# Patient Record
Sex: Female | Born: 1935 | Race: Black or African American | Hispanic: No | Smoking: Former smoker
Health system: Southern US, Community
[De-identification: ages and names within clinical notes are randomized; demographics above are authoritative.]

## PROBLEM LIST (undated history)

## (undated) DIAGNOSIS — I471 Supraventricular tachycardia, unspecified: Secondary | ICD-10-CM

## (undated) DIAGNOSIS — M8580 Other specified disorders of bone density and structure, unspecified site: Secondary | ICD-10-CM

## (undated) DIAGNOSIS — R55 Syncope and collapse: Secondary | ICD-10-CM

## (undated) DIAGNOSIS — F419 Anxiety disorder, unspecified: Secondary | ICD-10-CM

## (undated) DIAGNOSIS — Z8719 Personal history of other diseases of the digestive system: Secondary | ICD-10-CM

## (undated) DIAGNOSIS — R51 Headache: Secondary | ICD-10-CM

## (undated) DIAGNOSIS — Z853 Personal history of malignant neoplasm of breast: Secondary | ICD-10-CM

## (undated) DIAGNOSIS — I739 Peripheral vascular disease, unspecified: Secondary | ICD-10-CM

## (undated) DIAGNOSIS — E119 Type 2 diabetes mellitus without complications: Secondary | ICD-10-CM

## (undated) DIAGNOSIS — R410 Disorientation, unspecified: Secondary | ICD-10-CM

## (undated) DIAGNOSIS — K922 Gastrointestinal hemorrhage, unspecified: Secondary | ICD-10-CM

## (undated) DIAGNOSIS — D490 Neoplasm of unspecified behavior of digestive system: Secondary | ICD-10-CM

## (undated) DIAGNOSIS — I2510 Atherosclerotic heart disease of native coronary artery without angina pectoris: Secondary | ICD-10-CM

## (undated) DIAGNOSIS — K769 Liver disease, unspecified: Secondary | ICD-10-CM

## (undated) DIAGNOSIS — L89610 Pressure ulcer of right heel, unstageable: Secondary | ICD-10-CM

## (undated) DIAGNOSIS — S72001A Fracture of unspecified part of neck of right femur, initial encounter for closed fracture: Secondary | ICD-10-CM

## (undated) DIAGNOSIS — I5031 Acute diastolic (congestive) heart failure: Secondary | ICD-10-CM

## (undated) DIAGNOSIS — F329 Major depressive disorder, single episode, unspecified: Secondary | ICD-10-CM

## (undated) DIAGNOSIS — I779 Disorder of arteries and arterioles, unspecified: Secondary | ICD-10-CM

## (undated) DIAGNOSIS — I10 Essential (primary) hypertension: Secondary | ICD-10-CM

## (undated) DIAGNOSIS — E785 Hyperlipidemia, unspecified: Secondary | ICD-10-CM

## (undated) DIAGNOSIS — J189 Pneumonia, unspecified organism: Secondary | ICD-10-CM

## (undated) DIAGNOSIS — G8929 Other chronic pain: Secondary | ICD-10-CM

## (undated) DIAGNOSIS — F32A Depression, unspecified: Secondary | ICD-10-CM

## (undated) DIAGNOSIS — I951 Orthostatic hypotension: Principal | ICD-10-CM

## (undated) DIAGNOSIS — C801 Malignant (primary) neoplasm, unspecified: Secondary | ICD-10-CM

## (undated) HISTORY — DX: Other specified disorders of bone density and structure, unspecified site: M85.80

## (undated) HISTORY — PX: ABDOMINAL HYSTERECTOMY: SHX81

## (undated) HISTORY — PX: YAG LASER APPLICATION: SHX6189

## (undated) HISTORY — PX: APPENDECTOMY: SHX54

## (undated) HISTORY — PX: CATARACT EXTRACTION: SUR2

## (undated) HISTORY — DX: Neoplasm of unspecified behavior of digestive system: D49.0

## (undated) HISTORY — DX: Major depressive disorder, single episode, unspecified: F32.9

## (undated) HISTORY — PX: HEMORRHOID SURGERY: SHX153

## (undated) HISTORY — DX: Hyperlipidemia, unspecified: E78.5

## (undated) HISTORY — DX: Gastrointestinal hemorrhage, unspecified: K92.2

## (undated) HISTORY — DX: Depression, unspecified: F32.A

---

## 1977-11-16 HISTORY — PX: VESICOVAGINAL FISTULA CLOSURE W/ TAH: SUR271

## 1992-11-16 HISTORY — PX: MASTECTOMY: SHX3

## 2001-05-26 ENCOUNTER — Other Ambulatory Visit: Admission: RE | Admit: 2001-05-26 | Discharge: 2001-05-26 | Payer: Self-pay | Admitting: Family Medicine

## 2001-11-02 ENCOUNTER — Encounter: Payer: Self-pay | Admitting: Family Medicine

## 2001-11-02 ENCOUNTER — Ambulatory Visit (HOSPITAL_COMMUNITY): Admission: RE | Admit: 2001-11-02 | Discharge: 2001-11-02 | Payer: Self-pay | Admitting: Family Medicine

## 2002-10-06 ENCOUNTER — Ambulatory Visit (HOSPITAL_COMMUNITY): Admission: RE | Admit: 2002-10-06 | Discharge: 2002-10-06 | Payer: Self-pay | Admitting: Family Medicine

## 2002-10-06 ENCOUNTER — Encounter: Payer: Self-pay | Admitting: Family Medicine

## 2002-11-20 ENCOUNTER — Encounter: Payer: Self-pay | Admitting: Family Medicine

## 2002-11-20 ENCOUNTER — Ambulatory Visit (HOSPITAL_COMMUNITY): Admission: RE | Admit: 2002-11-20 | Discharge: 2002-11-20 | Payer: Self-pay | Admitting: Family Medicine

## 2003-11-22 ENCOUNTER — Ambulatory Visit (HOSPITAL_COMMUNITY): Admission: RE | Admit: 2003-11-22 | Discharge: 2003-11-22 | Payer: Self-pay | Admitting: Family Medicine

## 2004-04-17 ENCOUNTER — Ambulatory Visit (HOSPITAL_COMMUNITY): Admission: RE | Admit: 2004-04-17 | Discharge: 2004-04-17 | Payer: Self-pay | Admitting: General Surgery

## 2004-11-24 ENCOUNTER — Ambulatory Visit (HOSPITAL_COMMUNITY): Admission: RE | Admit: 2004-11-24 | Discharge: 2004-11-24 | Payer: Self-pay | Admitting: Family Medicine

## 2005-01-22 ENCOUNTER — Emergency Department (HOSPITAL_COMMUNITY): Admission: EM | Admit: 2005-01-22 | Discharge: 2005-01-23 | Payer: Self-pay | Admitting: *Deleted

## 2005-08-11 ENCOUNTER — Ambulatory Visit (HOSPITAL_COMMUNITY): Admission: RE | Admit: 2005-08-11 | Discharge: 2005-08-11 | Payer: Self-pay | Admitting: Ophthalmology

## 2005-11-26 ENCOUNTER — Encounter (INDEPENDENT_AMBULATORY_CARE_PROVIDER_SITE_OTHER): Payer: Self-pay | Admitting: Family Medicine

## 2005-11-26 LAB — CONVERTED CEMR LAB: Hgb A1c MFr Bld: 9.1 %

## 2005-11-30 ENCOUNTER — Ambulatory Visit (HOSPITAL_COMMUNITY): Admission: RE | Admit: 2005-11-30 | Discharge: 2005-11-30 | Payer: Self-pay | Admitting: Family Medicine

## 2006-05-30 ENCOUNTER — Emergency Department (HOSPITAL_COMMUNITY): Admission: EM | Admit: 2006-05-30 | Discharge: 2006-05-30 | Payer: Self-pay | Admitting: Emergency Medicine

## 2006-06-01 ENCOUNTER — Ambulatory Visit (HOSPITAL_COMMUNITY): Admission: RE | Admit: 2006-06-01 | Discharge: 2006-06-01 | Payer: Self-pay | Admitting: Family Medicine

## 2006-06-01 ENCOUNTER — Ambulatory Visit: Payer: Self-pay | Admitting: Family Medicine

## 2006-06-01 LAB — CONVERTED CEMR LAB

## 2006-06-08 ENCOUNTER — Other Ambulatory Visit: Admission: RE | Admit: 2006-06-08 | Discharge: 2006-06-08 | Payer: Self-pay | Admitting: Otolaryngology

## 2006-06-15 ENCOUNTER — Ambulatory Visit: Payer: Self-pay | Admitting: Family Medicine

## 2006-06-15 LAB — CONVERTED CEMR LAB: Hgb A1c MFr Bld: 7.4 %

## 2006-07-13 ENCOUNTER — Ambulatory Visit: Payer: Self-pay | Admitting: Family Medicine

## 2006-07-15 ENCOUNTER — Ambulatory Visit (HOSPITAL_COMMUNITY): Admission: RE | Admit: 2006-07-15 | Discharge: 2006-07-15 | Payer: Self-pay | Admitting: Family Medicine

## 2006-07-20 ENCOUNTER — Ambulatory Visit: Payer: Self-pay | Admitting: Family Medicine

## 2006-08-24 ENCOUNTER — Ambulatory Visit: Payer: Self-pay | Admitting: Family Medicine

## 2006-09-21 ENCOUNTER — Ambulatory Visit: Payer: Self-pay | Admitting: Family Medicine

## 2006-09-21 LAB — CONVERTED CEMR LAB: Hgb A1c MFr Bld: 6.8 %

## 2006-10-04 ENCOUNTER — Ambulatory Visit (HOSPITAL_COMMUNITY): Admission: RE | Admit: 2006-10-04 | Discharge: 2006-10-04 | Payer: Self-pay | Admitting: Family Medicine

## 2006-10-11 ENCOUNTER — Encounter: Payer: Self-pay | Admitting: Family Medicine

## 2006-10-11 DIAGNOSIS — M949 Disorder of cartilage, unspecified: Secondary | ICD-10-CM

## 2006-10-11 DIAGNOSIS — F411 Generalized anxiety disorder: Secondary | ICD-10-CM | POA: Insufficient documentation

## 2006-10-11 DIAGNOSIS — F32A Depression, unspecified: Secondary | ICD-10-CM | POA: Insufficient documentation

## 2006-10-11 DIAGNOSIS — Z853 Personal history of malignant neoplasm of breast: Secondary | ICD-10-CM | POA: Insufficient documentation

## 2006-10-11 DIAGNOSIS — M899 Disorder of bone, unspecified: Secondary | ICD-10-CM | POA: Insufficient documentation

## 2006-10-11 DIAGNOSIS — E785 Hyperlipidemia, unspecified: Secondary | ICD-10-CM | POA: Insufficient documentation

## 2006-10-11 DIAGNOSIS — I10 Essential (primary) hypertension: Secondary | ICD-10-CM | POA: Insufficient documentation

## 2006-10-11 DIAGNOSIS — F329 Major depressive disorder, single episode, unspecified: Secondary | ICD-10-CM

## 2006-10-19 ENCOUNTER — Ambulatory Visit: Payer: Self-pay | Admitting: Family Medicine

## 2006-10-26 ENCOUNTER — Ambulatory Visit (HOSPITAL_COMMUNITY): Admission: RE | Admit: 2006-10-26 | Discharge: 2006-10-26 | Payer: Self-pay | Admitting: Ophthalmology

## 2006-11-03 ENCOUNTER — Ambulatory Visit: Payer: Self-pay | Admitting: Family Medicine

## 2006-11-16 HISTORY — PX: CHOLECYSTECTOMY: SHX55

## 2006-12-01 ENCOUNTER — Ambulatory Visit: Payer: Self-pay | Admitting: Family Medicine

## 2006-12-01 LAB — CONVERTED CEMR LAB
ALT: 37 units/L — ABNORMAL HIGH (ref 0–35)
AST: 32 units/L (ref 0–37)
Albumin: 4.3 g/dL (ref 3.5–5.2)
Alkaline Phosphatase: 148 units/L — ABNORMAL HIGH (ref 39–117)
Bilirubin, Direct: 0.1 mg/dL (ref 0.0–0.3)
Indirect Bilirubin: 0.5 mg/dL (ref 0.0–0.9)
Total Bilirubin: 0.6 mg/dL (ref 0.3–1.2)
Total Protein: 7.9 g/dL (ref 6.0–8.3)

## 2007-01-11 ENCOUNTER — Ambulatory Visit: Payer: Self-pay | Admitting: Family Medicine

## 2007-01-11 LAB — CONVERTED CEMR LAB
Cholesterol, target level: 200 mg/dL
Glucose, Bld: 218 mg/dL
HDL goal, serum: 40 mg/dL
Hgb A1c MFr Bld: 6.8 %
LDL Goal: 100 mg/dL

## 2007-01-13 ENCOUNTER — Encounter (INDEPENDENT_AMBULATORY_CARE_PROVIDER_SITE_OTHER): Payer: Self-pay | Admitting: Family Medicine

## 2007-01-13 ENCOUNTER — Ambulatory Visit (HOSPITAL_COMMUNITY): Admission: RE | Admit: 2007-01-13 | Discharge: 2007-01-13 | Payer: Self-pay | Admitting: Family Medicine

## 2007-01-18 ENCOUNTER — Encounter (INDEPENDENT_AMBULATORY_CARE_PROVIDER_SITE_OTHER): Payer: Self-pay | Admitting: Family Medicine

## 2007-01-19 LAB — CONVERTED CEMR LAB
ALT: 27 units/L (ref 0–35)
AST: 24 units/L (ref 0–37)
Albumin: 4.1 g/dL (ref 3.5–5.2)
Alkaline Phosphatase: 120 units/L — ABNORMAL HIGH (ref 39–117)
BUN: 14 mg/dL (ref 6–23)
CO2: 26 meq/L (ref 19–32)
Calcium: 9.6 mg/dL (ref 8.4–10.5)
Chloride: 99 meq/L (ref 96–112)
Cholesterol: 209 mg/dL — ABNORMAL HIGH (ref 0–200)
Creatinine, Ser: 0.61 mg/dL (ref 0.40–1.20)
Glucose, Bld: 174 mg/dL — ABNORMAL HIGH (ref 70–99)
HDL: 50 mg/dL (ref >39)
LDL Cholesterol: 131 mg/dL — ABNORMAL HIGH (ref 0–99)
Potassium: 4 meq/L (ref 3.5–5.3)
Sodium: 140 meq/L (ref 135–145)
Total Bilirubin: 0.7 mg/dL (ref 0.3–1.2)
Total CHOL/HDL Ratio: 4.2
Total Protein: 8 g/dL (ref 6.0–8.3)
Triglycerides: 138 mg/dL (ref <150)
VLDL: 28 mg/dL (ref 0–40)

## 2007-02-07 ENCOUNTER — Encounter (INDEPENDENT_AMBULATORY_CARE_PROVIDER_SITE_OTHER): Payer: Self-pay | Admitting: Family Medicine

## 2007-03-21 ENCOUNTER — Ambulatory Visit: Payer: Self-pay | Admitting: Family Medicine

## 2007-03-21 DIAGNOSIS — K922 Gastrointestinal hemorrhage, unspecified: Secondary | ICD-10-CM

## 2007-03-21 HISTORY — DX: Gastrointestinal hemorrhage, unspecified: K92.2

## 2007-03-21 LAB — CONVERTED CEMR LAB: Hemoglobin: 12 g/dL

## 2007-03-22 ENCOUNTER — Encounter (INDEPENDENT_AMBULATORY_CARE_PROVIDER_SITE_OTHER): Payer: Self-pay | Admitting: Family Medicine

## 2007-03-23 ENCOUNTER — Encounter (INDEPENDENT_AMBULATORY_CARE_PROVIDER_SITE_OTHER): Payer: Self-pay | Admitting: Family Medicine

## 2007-03-23 LAB — CONVERTED CEMR LAB: RBC / HPF: NONE SEEN (ref <3)

## 2007-04-04 ENCOUNTER — Ambulatory Visit: Payer: Self-pay | Admitting: Family Medicine

## 2007-04-04 LAB — CONVERTED CEMR LAB
Glucose, Bld: 205 mg/dL
Hgb A1c MFr Bld: 6.4 %

## 2007-04-07 ENCOUNTER — Encounter (INDEPENDENT_AMBULATORY_CARE_PROVIDER_SITE_OTHER): Payer: Self-pay | Admitting: Family Medicine

## 2007-04-08 ENCOUNTER — Encounter (INDEPENDENT_AMBULATORY_CARE_PROVIDER_SITE_OTHER): Payer: Self-pay | Admitting: Family Medicine

## 2007-04-08 LAB — CONVERTED CEMR LAB
Basophils Absolute: 0.1 10*3/uL (ref 0.0–0.1)
Basophils Relative: 1 % (ref 0–1)
Eosinophils Absolute: 0.2 10*3/uL (ref 0.0–0.7)
Eosinophils Relative: 3 % (ref 0–5)
HCT: 39.2 % (ref 36.0–46.0)
Hemoglobin: 13.1 g/dL (ref 12.0–15.0)
Lymphocytes Relative: 37 % (ref 12–46)
Lymphs Abs: 2.9 10*3/uL (ref 0.7–3.3)
MCHC: 33.4 g/dL (ref 30.0–36.0)
MCV: 99.5 fL (ref 78.0–100.0)
Monocytes Absolute: 0.4 10*3/uL (ref 0.2–0.7)
Monocytes Relative: 5 % (ref 3–11)
Neutro Abs: 4.2 10*3/uL (ref 1.7–7.7)
Neutrophils Relative %: 54 % (ref 43–77)
Platelets: 379 10*3/uL (ref 150–400)
RBC: 3.94 M/uL (ref 3.87–5.11)
RDW: 14.1 % — ABNORMAL HIGH (ref 11.5–14.0)
WBC: 7.7 10*3/uL (ref 4.0–10.5)

## 2007-04-12 ENCOUNTER — Encounter (INDEPENDENT_AMBULATORY_CARE_PROVIDER_SITE_OTHER): Payer: Self-pay | Admitting: Family Medicine

## 2007-04-21 ENCOUNTER — Encounter (INDEPENDENT_AMBULATORY_CARE_PROVIDER_SITE_OTHER): Payer: Self-pay | Admitting: Family Medicine

## 2007-05-09 ENCOUNTER — Ambulatory Visit: Payer: Self-pay | Admitting: Family Medicine

## 2007-05-10 ENCOUNTER — Encounter (INDEPENDENT_AMBULATORY_CARE_PROVIDER_SITE_OTHER): Payer: Self-pay | Admitting: Family Medicine

## 2007-05-10 LAB — CONVERTED CEMR LAB
ALT: 20 U/L
AST: 22 U/L
Albumin: 4.2 g/dL
Alkaline Phosphatase: 109 U/L
Bilirubin, Direct: 0.2 mg/dL
Indirect Bilirubin: 0.5 mg/dL
Total Bilirubin: 0.7 mg/dL
Total Protein: 7.8 g/dL

## 2007-05-16 ENCOUNTER — Encounter (HOSPITAL_COMMUNITY): Admission: RE | Admit: 2007-05-16 | Discharge: 2007-06-15 | Payer: Self-pay | Admitting: Family Medicine

## 2007-06-20 ENCOUNTER — Ambulatory Visit: Payer: Self-pay | Admitting: Family Medicine

## 2007-06-20 ENCOUNTER — Telehealth (INDEPENDENT_AMBULATORY_CARE_PROVIDER_SITE_OTHER): Payer: Self-pay | Admitting: *Deleted

## 2007-06-21 ENCOUNTER — Encounter (INDEPENDENT_AMBULATORY_CARE_PROVIDER_SITE_OTHER): Payer: Self-pay | Admitting: Family Medicine

## 2007-06-21 ENCOUNTER — Telehealth (INDEPENDENT_AMBULATORY_CARE_PROVIDER_SITE_OTHER): Payer: Self-pay | Admitting: *Deleted

## 2007-06-21 LAB — CONVERTED CEMR LAB
ALT: 21 units/L (ref 0–35)
AST: 24 units/L (ref 0–37)
Albumin: 4.3 g/dL (ref 3.5–5.2)
Alkaline Phosphatase: 113 units/L (ref 39–117)
BUN: 11 mg/dL (ref 6–23)
Basophils Absolute: 0.1 10*3/uL (ref 0.0–0.1)
Basophils Relative: 2 % — ABNORMAL HIGH (ref 0–1)
CO2: 24 meq/L (ref 19–32)
Calcium: 9.5 mg/dL (ref 8.4–10.5)
Chloride: 99 meq/L (ref 96–112)
Creatinine, Ser: 0.56 mg/dL (ref 0.40–1.20)
Eosinophils Absolute: 0.1 10*3/uL (ref 0.0–0.7)
Eosinophils Relative: 2 % (ref 0–5)
Glucose, Bld: 157 mg/dL — ABNORMAL HIGH (ref 70–99)
HCT: 40.5 % (ref 36.0–46.0)
Hemoglobin: 13.4 g/dL (ref 12.0–15.0)
Lymphocytes Relative: 46 % (ref 12–46)
Lymphs Abs: 2.5 10*3/uL (ref 0.7–3.3)
MCHC: 33.1 g/dL (ref 30.0–36.0)
MCV: 98.3 fL (ref 78.0–100.0)
Monocytes Absolute: 0.2 10*3/uL (ref 0.2–0.7)
Monocytes Relative: 4 % (ref 3–11)
Neutro Abs: 2.5 10*3/uL (ref 1.7–7.7)
Neutrophils Relative %: 47 % (ref 43–77)
Platelets: 331 10*3/uL (ref 150–400)
Potassium: 3.9 meq/L (ref 3.5–5.3)
RBC: 4.12 M/uL (ref 3.87–5.11)
RDW: 13.7 % (ref 11.5–14.0)
Sodium: 138 meq/L (ref 135–145)
Total Bilirubin: 0.8 mg/dL (ref 0.3–1.2)
Total Protein: 7.8 g/dL (ref 6.0–8.3)
WBC: 5.4 10*3/uL (ref 4.0–10.5)

## 2007-07-06 ENCOUNTER — Encounter (INDEPENDENT_AMBULATORY_CARE_PROVIDER_SITE_OTHER): Payer: Self-pay | Admitting: Family Medicine

## 2007-07-15 ENCOUNTER — Encounter (INDEPENDENT_AMBULATORY_CARE_PROVIDER_SITE_OTHER): Payer: Self-pay | Admitting: General Surgery

## 2007-07-15 ENCOUNTER — Ambulatory Visit (HOSPITAL_COMMUNITY): Admission: RE | Admit: 2007-07-15 | Discharge: 2007-07-16 | Payer: Self-pay | Admitting: General Surgery

## 2007-07-20 ENCOUNTER — Ambulatory Visit: Payer: Self-pay | Admitting: Family Medicine

## 2007-07-20 LAB — CONVERTED CEMR LAB
Glucose, Bld: 226 mg/dL
Hgb A1c MFr Bld: 7.1 %

## 2007-08-03 ENCOUNTER — Encounter (INDEPENDENT_AMBULATORY_CARE_PROVIDER_SITE_OTHER): Payer: Self-pay | Admitting: Family Medicine

## 2007-08-11 ENCOUNTER — Encounter (INDEPENDENT_AMBULATORY_CARE_PROVIDER_SITE_OTHER): Payer: Self-pay | Admitting: Family Medicine

## 2007-08-12 LAB — CONVERTED CEMR LAB
ALT: 17 units/L (ref 0–35)
AST: 19 units/L (ref 0–37)
Albumin: 4.3 g/dL (ref 3.5–5.2)
Alkaline Phosphatase: 118 units/L — ABNORMAL HIGH (ref 39–117)
BUN: 9 mg/dL (ref 6–23)
CO2: 28 meq/L (ref 19–32)
Calcium: 9.4 mg/dL (ref 8.4–10.5)
Chloride: 99 meq/L (ref 96–112)
Cholesterol: 131 mg/dL (ref 0–200)
Creatinine, Ser: 0.59 mg/dL (ref 0.40–1.20)
Glucose, Bld: 129 mg/dL — ABNORMAL HIGH (ref 70–99)
HDL: 50 mg/dL (ref >39)
LDL Cholesterol: 64 mg/dL (ref 0–99)
Potassium: 3.8 meq/L (ref 3.5–5.3)
Sodium: 141 meq/L (ref 135–145)
Total Bilirubin: 0.7 mg/dL (ref 0.3–1.2)
Total CHOL/HDL Ratio: 2.6
Total Protein: 7.9 g/dL (ref 6.0–8.3)
Triglycerides: 86 mg/dL (ref <150)
VLDL: 17 mg/dL (ref 0–40)

## 2007-08-16 ENCOUNTER — Encounter (INDEPENDENT_AMBULATORY_CARE_PROVIDER_SITE_OTHER): Payer: Self-pay | Admitting: Family Medicine

## 2007-09-15 ENCOUNTER — Encounter (INDEPENDENT_AMBULATORY_CARE_PROVIDER_SITE_OTHER): Payer: Self-pay | Admitting: Family Medicine

## 2007-10-18 ENCOUNTER — Encounter (INDEPENDENT_AMBULATORY_CARE_PROVIDER_SITE_OTHER): Payer: Self-pay | Admitting: Family Medicine

## 2007-10-19 ENCOUNTER — Ambulatory Visit: Payer: Self-pay | Admitting: Family Medicine

## 2007-10-19 LAB — CONVERTED CEMR LAB
Glucose, Bld: 166 mg/dL
Hgb A1c MFr Bld: 7 %

## 2007-10-21 ENCOUNTER — Encounter (INDEPENDENT_AMBULATORY_CARE_PROVIDER_SITE_OTHER): Payer: Self-pay | Admitting: Family Medicine

## 2007-10-21 LAB — CONVERTED CEMR LAB
Bilirubin Urine: NEGATIVE
Glucose, Urine, Semiquant: NEGATIVE
Ketones, urine, test strip: NEGATIVE
Nitrite: NEGATIVE
Protein, U semiquant: NEGATIVE
Specific Gravity, Urine: 1.015
Urobilinogen, UA: 0.2
pH: 7

## 2007-10-22 ENCOUNTER — Encounter (INDEPENDENT_AMBULATORY_CARE_PROVIDER_SITE_OTHER): Payer: Self-pay | Admitting: Family Medicine

## 2007-10-22 LAB — CONVERTED CEMR LAB
Creatinine, Urine: 65.8 mg/dL
Microalb Creat Ratio: 8.8 mg/g (ref 0.0–30.0)
Microalb, Ur: 0.58 mg/dL (ref 0.00–1.89)

## 2007-10-24 ENCOUNTER — Telehealth (INDEPENDENT_AMBULATORY_CARE_PROVIDER_SITE_OTHER): Payer: Self-pay | Admitting: *Deleted

## 2008-01-17 ENCOUNTER — Ambulatory Visit (HOSPITAL_COMMUNITY): Admission: RE | Admit: 2008-01-17 | Discharge: 2008-01-17 | Payer: Self-pay | Admitting: Family Medicine

## 2008-01-17 ENCOUNTER — Encounter (INDEPENDENT_AMBULATORY_CARE_PROVIDER_SITE_OTHER): Payer: Self-pay | Admitting: Family Medicine

## 2008-01-17 LAB — CONVERTED CEMR LAB: Pap Smear: NORMAL

## 2008-01-18 ENCOUNTER — Ambulatory Visit: Payer: Self-pay | Admitting: Family Medicine

## 2008-01-18 LAB — CONVERTED CEMR LAB
Glucose, Bld: 188 mg/dL
Hgb A1c MFr Bld: 7.6 %

## 2008-01-19 ENCOUNTER — Encounter (INDEPENDENT_AMBULATORY_CARE_PROVIDER_SITE_OTHER): Payer: Self-pay | Admitting: Family Medicine

## 2008-01-19 ENCOUNTER — Telehealth (INDEPENDENT_AMBULATORY_CARE_PROVIDER_SITE_OTHER): Payer: Self-pay | Admitting: *Deleted

## 2008-01-19 LAB — CONVERTED CEMR LAB
BUN: 11 mg/dL (ref 6–23)
Basophils Absolute: 0.1 10*3/uL (ref 0.0–0.1)
Basophils Relative: 2 % — ABNORMAL HIGH (ref 0–1)
CO2: 25 meq/L (ref 19–32)
Calcium: 9.3 mg/dL (ref 8.4–10.5)
Chloride: 100 meq/L (ref 96–112)
Creatinine, Ser: 0.57 mg/dL (ref 0.40–1.20)
Eosinophils Absolute: 0.1 10*3/uL (ref 0.0–0.7)
Eosinophils Relative: 1 % (ref 0–5)
Glucose, Bld: 156 mg/dL — ABNORMAL HIGH (ref 70–99)
HCT: 40.6 % (ref 36.0–46.0)
Hemoglobin: 13.4 g/dL (ref 12.0–15.0)
Lymphocytes Relative: 42 % (ref 12–46)
Lymphs Abs: 2.3 10*3/uL (ref 0.7–4.0)
MCHC: 33 g/dL (ref 30.0–36.0)
MCV: 99.5 fL (ref 78.0–100.0)
Monocytes Absolute: 0.3 10*3/uL (ref 0.1–1.0)
Monocytes Relative: 5 % (ref 3–12)
Neutro Abs: 2.8 10*3/uL (ref 1.7–7.7)
Neutrophils Relative %: 50 % (ref 43–77)
Platelets: 281 10*3/uL (ref 150–400)
Potassium: 3.8 meq/L (ref 3.5–5.3)
RBC: 4.08 M/uL (ref 3.87–5.11)
RDW: 13.9 % (ref 11.5–15.5)
Sodium: 138 meq/L (ref 135–145)
WBC: 5.5 10*3/uL (ref 4.0–10.5)

## 2008-01-24 ENCOUNTER — Other Ambulatory Visit: Admission: RE | Admit: 2008-01-24 | Discharge: 2008-01-24 | Payer: Self-pay | Admitting: Family Medicine

## 2008-01-24 ENCOUNTER — Ambulatory Visit: Payer: Self-pay | Admitting: Family Medicine

## 2008-01-24 ENCOUNTER — Encounter (INDEPENDENT_AMBULATORY_CARE_PROVIDER_SITE_OTHER): Payer: Self-pay | Admitting: Family Medicine

## 2008-01-25 ENCOUNTER — Encounter (INDEPENDENT_AMBULATORY_CARE_PROVIDER_SITE_OTHER): Payer: Self-pay | Admitting: Family Medicine

## 2008-01-27 ENCOUNTER — Telehealth (INDEPENDENT_AMBULATORY_CARE_PROVIDER_SITE_OTHER): Payer: Self-pay | Admitting: *Deleted

## 2008-01-27 LAB — CONVERTED CEMR LAB
Candida species: NEGATIVE
Gardnerella vaginalis: POSITIVE — AB
Trichomonal Vaginitis: NEGATIVE

## 2008-02-17 ENCOUNTER — Encounter (INDEPENDENT_AMBULATORY_CARE_PROVIDER_SITE_OTHER): Payer: Self-pay | Admitting: Family Medicine

## 2008-03-05 ENCOUNTER — Ambulatory Visit: Payer: Self-pay | Admitting: Family Medicine

## 2008-03-05 DIAGNOSIS — L905 Scar conditions and fibrosis of skin: Secondary | ICD-10-CM | POA: Insufficient documentation

## 2008-03-06 ENCOUNTER — Encounter (INDEPENDENT_AMBULATORY_CARE_PROVIDER_SITE_OTHER): Payer: Self-pay | Admitting: Family Medicine

## 2008-05-01 ENCOUNTER — Encounter (INDEPENDENT_AMBULATORY_CARE_PROVIDER_SITE_OTHER): Payer: Self-pay | Admitting: Family Medicine

## 2008-05-03 LAB — CONVERTED CEMR LAB
ALT: 16 units/L (ref 0–35)
AST: 18 units/L (ref 0–37)
Albumin: 4.3 g/dL (ref 3.5–5.2)
Alkaline Phosphatase: 93 units/L (ref 39–117)
BUN: 13 mg/dL (ref 6–23)
CO2: 27 meq/L (ref 19–32)
Calcium: 9.5 mg/dL (ref 8.4–10.5)
Chloride: 99 meq/L (ref 96–112)
Cholesterol: 140 mg/dL (ref 0–200)
Creatinine, Ser: 0.5 mg/dL (ref 0.40–1.20)
Glucose, Bld: 140 mg/dL — ABNORMAL HIGH (ref 70–99)
HDL: 46 mg/dL (ref >39)
LDL Cholesterol: 78 mg/dL (ref 0–99)
Potassium: 3.9 meq/L (ref 3.5–5.3)
Sodium: 140 meq/L (ref 135–145)
Total Bilirubin: 0.7 mg/dL (ref 0.3–1.2)
Total CHOL/HDL Ratio: 3
Total Protein: 7.6 g/dL (ref 6.0–8.3)
Triglycerides: 78 mg/dL (ref <150)
VLDL: 16 mg/dL (ref 0–40)

## 2008-05-07 ENCOUNTER — Ambulatory Visit: Payer: Self-pay | Admitting: Family Medicine

## 2008-05-07 DIAGNOSIS — R259 Unspecified abnormal involuntary movements: Secondary | ICD-10-CM | POA: Insufficient documentation

## 2008-05-07 LAB — CONVERTED CEMR LAB
Glucose, Bld: 169 mg/dL
Hgb A1c MFr Bld: 7.1 %

## 2008-05-09 LAB — CONVERTED CEMR LAB
Basophils Absolute: 0.1 10*3/uL (ref 0.0–0.1)
Basophils Relative: 3 % — ABNORMAL HIGH (ref 0–1)
Eosinophils Absolute: 0.1 10*3/uL (ref 0.0–0.7)
Eosinophils Relative: 2 % (ref 0–5)
HCT: 39.2 % (ref 36.0–46.0)
Hemoglobin: 13.3 g/dL (ref 12.0–15.0)
Lymphocytes Relative: 42 % (ref 12–46)
Lymphs Abs: 2.1 10*3/uL (ref 0.7–4.0)
MCHC: 33.9 g/dL (ref 30.0–36.0)
MCV: 97.5 fL (ref 78.0–100.0)
Monocytes Absolute: 0.3 10*3/uL (ref 0.1–1.0)
Monocytes Relative: 6 % (ref 3–12)
Neutro Abs: 2.4 10*3/uL (ref 1.7–7.7)
Neutrophils Relative %: 48 % (ref 43–77)
Platelets: 300 10*3/uL (ref 150–400)
RBC: 4.02 M/uL (ref 3.87–5.11)
RDW: 14.2 % (ref 11.5–15.5)
TSH: 1.335 microintl units/mL (ref 0.350–5.50)
WBC: 5.1 10*3/uL (ref 4.0–10.5)

## 2008-06-18 ENCOUNTER — Ambulatory Visit: Payer: Self-pay | Admitting: Family Medicine

## 2008-06-18 ENCOUNTER — Telehealth (INDEPENDENT_AMBULATORY_CARE_PROVIDER_SITE_OTHER): Payer: Self-pay | Admitting: *Deleted

## 2008-07-02 ENCOUNTER — Encounter (INDEPENDENT_AMBULATORY_CARE_PROVIDER_SITE_OTHER): Payer: Self-pay | Admitting: Family Medicine

## 2008-07-30 ENCOUNTER — Ambulatory Visit: Payer: Self-pay | Admitting: Family Medicine

## 2008-07-30 DIAGNOSIS — E119 Type 2 diabetes mellitus without complications: Secondary | ICD-10-CM | POA: Insufficient documentation

## 2008-07-30 LAB — CONVERTED CEMR LAB
Glucose, Bld: 249 mg/dL
Hgb A1c MFr Bld: 6.9 %

## 2008-08-02 ENCOUNTER — Ambulatory Visit (HOSPITAL_COMMUNITY): Admission: RE | Admit: 2008-08-02 | Discharge: 2008-08-02 | Payer: Self-pay | Admitting: Family Medicine

## 2008-08-08 ENCOUNTER — Encounter (INDEPENDENT_AMBULATORY_CARE_PROVIDER_SITE_OTHER): Payer: Self-pay | Admitting: Family Medicine

## 2008-08-13 ENCOUNTER — Ambulatory Visit: Payer: Self-pay | Admitting: Family Medicine

## 2008-08-13 LAB — CONVERTED CEMR LAB
Bilirubin Urine: NEGATIVE
Glucose, Urine, Semiquant: NEGATIVE
Ketones, urine, test strip: NEGATIVE
Nitrite: NEGATIVE
Specific Gravity, Urine: 1.015
Urobilinogen, UA: 0.2
pH: 7

## 2008-09-10 ENCOUNTER — Ambulatory Visit: Payer: Self-pay | Admitting: Family Medicine

## 2008-09-15 ENCOUNTER — Emergency Department (HOSPITAL_COMMUNITY): Admission: EM | Admit: 2008-09-15 | Discharge: 2008-09-15 | Payer: Self-pay | Admitting: Emergency Medicine

## 2008-10-22 ENCOUNTER — Ambulatory Visit: Payer: Self-pay | Admitting: Family Medicine

## 2008-10-22 LAB — CONVERTED CEMR LAB
Glucose, Bld: 208 mg/dL
Hgb A1c MFr Bld: 7.1 %

## 2008-10-23 ENCOUNTER — Encounter (INDEPENDENT_AMBULATORY_CARE_PROVIDER_SITE_OTHER): Payer: Self-pay | Admitting: Family Medicine

## 2008-10-23 LAB — CONVERTED CEMR LAB
ALT: 25 units/L (ref 0–35)
AST: 25 units/L (ref 0–37)
Albumin: 4.4 g/dL (ref 3.5–5.2)
Alkaline Phosphatase: 152 units/L — ABNORMAL HIGH (ref 39–117)
BUN: 12 mg/dL (ref 6–23)
CO2: 24 meq/L (ref 19–32)
Calcium: 9.7 mg/dL (ref 8.4–10.5)
Chloride: 99 meq/L (ref 96–112)
Creatinine, Ser: 0.53 mg/dL (ref 0.40–1.20)
Glucose, Bld: 189 mg/dL — ABNORMAL HIGH (ref 70–99)
Potassium: 3.7 meq/L (ref 3.5–5.3)
Sodium: 137 meq/L (ref 135–145)
Total Bilirubin: 0.8 mg/dL (ref 0.3–1.2)
Total Protein: 7.9 g/dL (ref 6.0–8.3)

## 2008-10-29 ENCOUNTER — Ambulatory Visit (HOSPITAL_COMMUNITY): Admission: RE | Admit: 2008-10-29 | Discharge: 2008-10-29 | Payer: Self-pay | Admitting: Family Medicine

## 2008-10-29 ENCOUNTER — Encounter (INDEPENDENT_AMBULATORY_CARE_PROVIDER_SITE_OTHER): Payer: Self-pay | Admitting: Family Medicine

## 2008-11-06 ENCOUNTER — Ambulatory Visit: Payer: Self-pay | Admitting: Family Medicine

## 2008-11-16 HISTORY — PX: COLONOSCOPY: SHX174

## 2009-01-16 ENCOUNTER — Ambulatory Visit (HOSPITAL_COMMUNITY): Admission: RE | Admit: 2009-01-16 | Discharge: 2009-01-16 | Payer: Self-pay | Admitting: Family Medicine

## 2009-02-05 ENCOUNTER — Ambulatory Visit: Payer: Self-pay | Admitting: Family Medicine

## 2009-02-14 ENCOUNTER — Encounter (INDEPENDENT_AMBULATORY_CARE_PROVIDER_SITE_OTHER): Payer: Self-pay | Admitting: Family Medicine

## 2009-02-15 ENCOUNTER — Encounter (INDEPENDENT_AMBULATORY_CARE_PROVIDER_SITE_OTHER): Payer: Self-pay | Admitting: Family Medicine

## 2009-02-15 LAB — CONVERTED CEMR LAB
ALT: 19 units/L (ref 0–35)
AST: 23 units/L (ref 0–37)
Albumin: 4.1 g/dL (ref 3.5–5.2)
Alkaline Phosphatase: 121 units/L — ABNORMAL HIGH (ref 39–117)
BUN: 10 mg/dL (ref 6–23)
Basophils Absolute: 0.1 10*3/uL (ref 0.0–0.1)
Basophils Relative: 2 % — ABNORMAL HIGH (ref 0–1)
CO2: 27 meq/L (ref 19–32)
Calcium: 9.6 mg/dL (ref 8.4–10.5)
Chloride: 99 meq/L (ref 96–112)
Cholesterol: 151 mg/dL (ref 0–200)
Creatinine, Ser: 0.58 mg/dL (ref 0.40–1.20)
Eosinophils Absolute: 0.2 10*3/uL (ref 0.0–0.7)
Eosinophils Relative: 3 % (ref 0–5)
Glucose, Bld: 170 mg/dL — ABNORMAL HIGH (ref 70–99)
HCT: 38.4 % (ref 36.0–46.0)
HDL: 52 mg/dL (ref >39)
Hemoglobin: 13.6 g/dL (ref 12.0–15.0)
Hgb A1c MFr Bld: 7.6 % — ABNORMAL HIGH (ref 4.6–6.1)
LDL Cholesterol: 79 mg/dL (ref 0–99)
Lymphocytes Relative: 47 % — ABNORMAL HIGH (ref 12–46)
Lymphs Abs: 2.8 10*3/uL (ref 0.7–4.0)
MCHC: 35.4 g/dL (ref 30.0–36.0)
MCV: 95 fL (ref 78.0–100.0)
Microalb, Ur: 3.02 mg/dL — ABNORMAL HIGH (ref 0.00–1.89)
Monocytes Absolute: 0.3 10*3/uL (ref 0.1–1.0)
Monocytes Relative: 6 % (ref 3–12)
Neutro Abs: 2.6 10*3/uL (ref 1.7–7.7)
Neutrophils Relative %: 43 % (ref 43–77)
Platelets: 299 10*3/uL (ref 150–400)
Potassium: 4.3 meq/L (ref 3.5–5.3)
RBC: 4.04 M/uL (ref 3.87–5.11)
RDW: 14.4 % (ref 11.5–15.5)
Sodium: 138 meq/L (ref 135–145)
TSH: 1.945 microintl units/mL (ref 0.350–4.500)
Total Bilirubin: 0.8 mg/dL (ref 0.3–1.2)
Total CHOL/HDL Ratio: 2.9
Total Protein: 7.5 g/dL (ref 6.0–8.3)
Triglycerides: 98 mg/dL (ref <150)
VLDL: 20 mg/dL (ref 0–40)
WBC: 6 10*3/uL (ref 4.0–10.5)

## 2009-02-18 LAB — CONVERTED CEMR LAB
Creatinine, Urine: 89.7 mg/dL
Microalb Creat Ratio: 34 mg/g — ABNORMAL HIGH (ref 0.0–30.0)
Microalb, Ur: 3.05 mg/dL — ABNORMAL HIGH (ref 0.00–1.89)

## 2009-03-18 ENCOUNTER — Encounter (INDEPENDENT_AMBULATORY_CARE_PROVIDER_SITE_OTHER): Payer: Self-pay | Admitting: Family Medicine

## 2009-05-07 ENCOUNTER — Ambulatory Visit: Payer: Self-pay | Admitting: Family Medicine

## 2009-05-07 LAB — CONVERTED CEMR LAB
Glucose, Bld: 152 mg/dL
Hgb A1c MFr Bld: 7 %

## 2009-06-20 ENCOUNTER — Encounter (INDEPENDENT_AMBULATORY_CARE_PROVIDER_SITE_OTHER): Payer: Self-pay | Admitting: Family Medicine

## 2009-08-06 ENCOUNTER — Ambulatory Visit: Payer: Self-pay | Admitting: Family Medicine

## 2009-08-06 LAB — CONVERTED CEMR LAB
Glucose, Bld: 199 mg/dL
Hgb A1c MFr Bld: 7.3 %

## 2009-08-07 ENCOUNTER — Encounter (INDEPENDENT_AMBULATORY_CARE_PROVIDER_SITE_OTHER): Payer: Self-pay | Admitting: Family Medicine

## 2009-08-08 LAB — CONVERTED CEMR LAB
ALT: 21 units/L (ref 0–35)
AST: 23 units/L (ref 0–37)
Albumin: 4.1 g/dL (ref 3.5–5.2)
Alkaline Phosphatase: 112 units/L (ref 39–117)
BUN: 9 mg/dL (ref 6–23)
CO2: 27 meq/L (ref 19–32)
Calcium: 9.1 mg/dL (ref 8.4–10.5)
Chloride: 98 meq/L (ref 96–112)
Creatinine, Ser: 0.53 mg/dL (ref 0.40–1.20)
Glucose, Bld: 162 mg/dL — ABNORMAL HIGH (ref 70–99)
Potassium: 4 meq/L (ref 3.5–5.3)
Sodium: 136 meq/L (ref 135–145)
Total Bilirubin: 0.7 mg/dL (ref 0.3–1.2)
Total Protein: 7.3 g/dL (ref 6.0–8.3)

## 2009-08-14 ENCOUNTER — Encounter (INDEPENDENT_AMBULATORY_CARE_PROVIDER_SITE_OTHER): Payer: Self-pay | Admitting: Family Medicine

## 2009-09-03 ENCOUNTER — Encounter: Payer: Self-pay | Admitting: Gastroenterology

## 2009-09-04 ENCOUNTER — Ambulatory Visit (HOSPITAL_COMMUNITY): Admission: RE | Admit: 2009-09-04 | Discharge: 2009-09-04 | Payer: Self-pay | Admitting: Gastroenterology

## 2009-09-04 ENCOUNTER — Encounter: Payer: Self-pay | Admitting: Gastroenterology

## 2009-09-04 ENCOUNTER — Ambulatory Visit: Payer: Self-pay | Admitting: Gastroenterology

## 2010-01-20 ENCOUNTER — Ambulatory Visit (HOSPITAL_COMMUNITY): Admission: RE | Admit: 2010-01-20 | Discharge: 2010-01-20 | Payer: Self-pay | Admitting: Internal Medicine

## 2010-03-28 ENCOUNTER — Encounter: Payer: Self-pay | Admitting: Emergency Medicine

## 2010-03-28 ENCOUNTER — Inpatient Hospital Stay (HOSPITAL_COMMUNITY): Admission: EM | Admit: 2010-03-28 | Discharge: 2010-04-01 | Payer: Self-pay | Admitting: Cardiovascular Disease

## 2010-03-28 ENCOUNTER — Ambulatory Visit: Payer: Self-pay | Admitting: Cardiology

## 2010-03-30 ENCOUNTER — Encounter (INDEPENDENT_AMBULATORY_CARE_PROVIDER_SITE_OTHER): Payer: Self-pay | Admitting: Internal Medicine

## 2010-04-15 ENCOUNTER — Ambulatory Visit: Payer: Self-pay | Admitting: Cardiovascular Disease

## 2010-04-15 DIAGNOSIS — R55 Syncope and collapse: Secondary | ICD-10-CM | POA: Insufficient documentation

## 2010-04-15 DIAGNOSIS — I2510 Atherosclerotic heart disease of native coronary artery without angina pectoris: Secondary | ICD-10-CM | POA: Insufficient documentation

## 2010-04-15 DIAGNOSIS — I25119 Atherosclerotic heart disease of native coronary artery with unspecified angina pectoris: Secondary | ICD-10-CM | POA: Insufficient documentation

## 2010-04-15 HISTORY — DX: Syncope and collapse: R55

## 2010-09-17 ENCOUNTER — Emergency Department (HOSPITAL_COMMUNITY): Admission: EM | Admit: 2010-09-17 | Discharge: 2010-09-17 | Payer: Self-pay | Admitting: Emergency Medicine

## 2010-10-30 ENCOUNTER — Ambulatory Visit: Payer: Self-pay | Admitting: Cardiovascular Disease

## 2010-12-07 ENCOUNTER — Encounter: Payer: Self-pay | Admitting: Family Medicine

## 2010-12-16 NOTE — Letter (Signed)
Summary: [facility]   Imported By: Eliezer Mccoy 02/12/2009 14:45:49  _____________________________________________________________________  External Attachment:    Type:   Image     Comment:   External Document

## 2010-12-16 NOTE — Assessment & Plan Note (Signed)
Summary: 3 MONTH FOLLOW UP/ARC   Vital Signs:  Patient Profile:   77 Years Old Female Height:     64 inches Weight:      133 pounds BMI:     22.91 O2 Sat:      98 % Temp:     97.2 degrees F Pulse rate:   73 / minute Resp:     14 per minute BP sitting:   129 / 67  Vitals Entered By: Deidre Ala (October 19, 2007 10:39 AM)                ;;  Chief Complaint:  Recheck.  Diabetes Management History:      The patient is a 77 years old female who comes in for evaluation of DM Type 2.  She is (or has been) enrolled in the "Diabetic Education Program".  She states understanding of dietary principles and is following her diet appropriately.  No sensory loss is reported.  Self foot exams are being performed.  She is checking home blood sugars.  She says that she is not exercising regularly.        Hypoglycemic symptoms are not occurring.  No hyperglycemic symptoms are reported.  Other comments include: Once daily FSBS. Low 100s. .    Hypertension History:      She denies headache, chest pain, palpitations, dyspnea with exertion, orthopnea, PND, peripheral edema, visual symptoms, neurologic problems, syncope, and side effects from treatment.  She notes no problems with any antihypertensive medication side effects.  Further comments include: Tolerating medications.        Positive major cardiovascular risk factors include female age 60 years old or older, diabetes, hyperlipidemia, hypertension, and current tobacco user.  Negative major cardiovascular risk factors include negative family history for ischemic heart disease.        Further assessment for target organ damage reveals no history of ASHD, stroke/TIA, or peripheral vascular disease.    Lipid Management History:      Positive NCEP/ATP III risk factors include female age 45 years old or older, diabetes, current tobacco user, and hypertension.  Negative NCEP/ATP III risk factors include no history of early menopause without estrogen  hormone replacement, no family history for ischemic heart disease, no ASHD (atherosclerotic heart disease), no prior stroke/TIA, no peripheral vascular disease, and no history of aortic aneurysm.        The patient states that she knows about the "Therapeutic Lifestyle Change" diet.  Her compliance with the TLC diet is fair.  The patient expresses understanding of adjunctive measures for cholesterol lowering.  Adjunctive measures started by the patient include aerobic exercise, fiber, ASA, and omega-3 supplements.  She expresses no side effects from her lipid-lowering medication.  The patient denies any symptoms to suggest myopathy or liver disease from her "statin" therapy.  Comments: Tolerating statin.    Chief Complaint:  Recheck.  Diabetes Management History:      The patient is a 77 years old female who comes in for evaluation of DM Type 2.  She is (or has been) enrolled in the "Diabetic Education Program".  She states understanding of dietary principles and is following her diet appropriately.  No sensory loss is reported.  Self foot exams are being performed.  She is checking home blood sugars.  She says that she is not exercising regularly.        Hypoglycemic symptoms are not occurring.  No hyperglycemic symptoms are reported.  Other comments include: Once daily  FSBS. Low 100s. .    Hypertension History:      She denies headache, chest pain, palpitations, dyspnea with exertion, orthopnea, PND, peripheral edema, visual symptoms, neurologic problems, syncope, and side effects from treatment.  She notes no problems with any antihypertensive medication side effects.  Further comments include: Tolerating medications.        Positive major cardiovascular risk factors include female age 11 years old or older, diabetes, hyperlipidemia, hypertension, and current tobacco user.  Negative major cardiovascular risk factors include negative family history for ischemic heart disease.        Further  assessment for target organ damage reveals no history of ASHD, stroke/TIA, or peripheral vascular disease.    Lipid Management History:      Positive NCEP/ATP III risk factors include female age 19 years old or older, diabetes, current tobacco user, and hypertension.  Negative NCEP/ATP III risk factors include no history of early menopause without estrogen hormone replacement, no family history for ischemic heart disease, no ASHD (atherosclerotic heart disease), no prior stroke/TIA, no peripheral vascular disease, and no history of aortic aneurysm.        The patient states that she knows about the "Therapeutic Lifestyle Change" diet.  Her compliance with the TLC diet is fair.  The patient expresses understanding of adjunctive measures for cholesterol lowering.  Adjunctive measures started by the patient include aerobic exercise, fiber, ASA, and omega-3 supplements.  She expresses no side effects from her lipid-lowering medication.  The patient denies any symptoms to suggest myopathy or liver disease from her "statin" therapy.  Comments: Tolerating statin.    Current Allergies: ! PCN  Past Medical History:    Reviewed history from 10/11/2006 and no changes required:       Anxiety       Depression       Hyperlipidemia       Hypertension       Osteopenia  Past Surgical History:    Reviewed history from 07/20/2007 and no changes required:       L mastectomy       TAH       Upper GI - Early 2008  - gastropathy       Lap Choly July 15, 2007 - Dr. 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  Resp      Denies cough, shortness of breath, sputum productive, and wheezing.  GI      Denies abdominal pain, constipation, diarrhea, nausea, and vomiting.  GU      Denies abnormal vaginal bleeding, nocturia, urinary frequency, and urinary hesitancy.   Physical Exam  General:     Well-developed,well-nourished,in no acute distress; alert,appropriate and cooperative throughout examination Lungs:  Normal respiratory effort, chest expands symmetrically. Lungs are clear to auscultation, no crackles or wheezes. Heart:     Normal rate and regular rhythm. S1 and S2 normal without gallop, murmur, click, rub or other extra sounds. Abdomen:     Bowel sounds positive,abdomen soft without masses, no organomegaly or hernias noted. Surgical scars noted from Lap choly. Healed Extremities:     No clubbing, cyanosis, edema, or deformity noted with normal full range of motion of all joints.    Diabetes Management Exam:    Foot Exam (with socks and/or shoes not present):       Sensory-Monofilament:          Left foot: normal          Right foot: normal       Sensory-other: Due for eye exam.       Inspection:          Left foot: normal          Right foot: normal       Nails:          Left foot: normal          Right foot: normal    Impression & Recommendations:  Problem # 1:  DIABETES MELLITUS, TYPE II, CONTROLLED, WITH COMPLICATIONS ([id number]-086.76) A1C at 7. Check urine micral ratio. Advised foot care and need for eye exam. Meds as is and recheck three months. Her updated medication list for this problem includes:    Lotrel 5-10 Mg Caps (Amlodipine besy-benazepril hcl) ..... Once daily    Aspir-low 81 Mg Tbec (Aspirin) ..... On hold    Glyburide-metformin 5-500 Mg Tabs (Glyburide-metformin) .Marland Kitchen... 2 in am and one at night  Orders: Capillary Blood Glucose (82948) Hemoglobin A1C (83036) Venipuncture  (19509)   Problem # 2:  HYPERTENSION (ICD-401.9) Stable. No med change. Councelled TLC. Her updated medication list for this problem includes:    Indapamide 1.25 Mg Tabs (Indapamide) ..... Once daily    Lotrel 5-10 Mg Caps (Amlodipine besy-benazepril hcl) ..... Once daily   Problem # 3:  HYPERLIPIDEMIA (ICD-272.4) To goal. Med as is. Follow LFTs periodically Her updated medication list for this problem includes:    Lipitor 10 Mg Tabs (Atorvastatin calcium) ..... One daily   Problem # 4:  UPPER GASTROINTESTINAL HEMORRHAGE (ICD-578.9) States has not used Nexium last few weeks. Advised need to resume given hx of upper GI bleed. Agrees. Avoid NSAIDs. Repeat CBC on retrun in 3 months. Monitor stool for blood and discoloration. Agrees.  Problem # 5:  Preventive Health Care (ICD-V70.0) States had Mammogram earlier this year. Will ask nurse to call and verify and see if we can get copy of result. Await result and optomize.  Complete Medication List: 1)  Alprazolam 0.25 Mg Tabs (Alprazolam) .... Two times a day 2)  Indapamide 1.25 Mg Tabs (Indapamide) .... Once daily 3)  Lotrel 5-10 Mg Caps (Amlodipine besy-benazepril hcl) .... Once daily 4)  Tylenol Extra Strength 500 Mg Tabs (Acetaminophen) .... As needed 5)  Aspir-low 81 Mg Tbec (Aspirin) .... On hold 6)  Oyst-cal D 250-125 Mg-unit Tabs (Calcium carbonate-vitamin d) .... Three times a day 7)  Glyburide-metformin 5-500 Mg Tabs (Glyburide-metformin) .... 2 in am and one at night 8)  Nexium 40 Mg Cpdr (Esomeprazole magnesium) .... One daily 9)  Lipitor 10 Mg Tabs (Atorvastatin calcium) .... One daily  Diabetes Management Assessment/Plan:      The following lipid goals have been established for the patient: Total cholesterol goal  of 200; LDL cholesterol goal of 100; HDL cholesterol goal of 40; Triglyceride goal of 150.  Her blood pressure goal is < 130/80.    Hypertension Assessment/Plan:      The patient's hypertensive risk group is  category C: Target organ damage and/or diabetes.  Her calculated 10 year risk of coronary heart disease is 17 %.  Today's blood pressure is 129/67.  Her blood pressure goal is < 130/80.  Lipid Assessment/Plan:      Based on NCEP/ATP III, the patient's risk factor category is "history of diabetes".  From this information, the patient's calculated lipid goals are as follows: Total cholesterol goal is 200; LDL cholesterol goal is 100; HDL cholesterol goal is 40; Triglyceride goal is 150.     Patient Instructions: 1)  Please schedule a follow-up appointment in 3 months.    ] Laboratory Results   Blood Tests     Glucose (random): 166 mg/dL   (Normal Range: 70-105) HGBA1C: 7.0%   (Normal Range: Non-Diabetic - 3-6%   Control Diabetic - 6-8%)        Last LDL:                                                 64 (08/11/2007 8:37:00 PM)          Preventive Care Screening  Last Flu Shot:    Next Due:  10/2008    Appended Document: 3 MONTH FOLLOW UP/ARC last mammograms given to dr Jonna Munro...12.11.08.Marland KitchenMarland Kitchenarc

## 2010-12-16 NOTE — Medication Information (Signed)
Summary: [facility]   Imported By: Durwin Reges 04/21/2007 16:44:04  _____________________________________________________________________  External Attachment:    Type:   Image     Comment:   External Document

## 2010-12-16 NOTE — Medication Information (Signed)
Summary: [facility]   Imported By: Konrad Dolores 08/16/2007 14:44:31  _____________________________________________________________________  External Attachment:    Type:   Image     Comment:   External Document

## 2010-12-16 NOTE — Letter (Signed)
Summary: [facility]   Imported By: Otila Kluver, LPN 63/84/5364 68:03:21  _____________________________________________________________________  External Attachment:    Type:   Image     Comment:   External Document

## 2010-12-16 NOTE — Letter (Signed)
Summary: [facility]   Imported By: Otila Kluver, LPN 00/93/8182 99:37:16  _____________________________________________________________________  External Attachment:    Type:   Image     Comment:   External Document

## 2010-12-16 NOTE — Medication Information (Signed)
Summary: [facility]   Imported By: Eliezer Mccoy 01/18/2008 10:06:51  _____________________________________________________________________  External Attachment:    Type:   Image     Comment:   External Document

## 2010-12-16 NOTE — Letter (Signed)
[facility]: [facility]   Imported By: Eliezer Mccoy 03/21/2009 16:57:23  _____________________________________________________________________  External Attachment:    Type:   Image     Comment:   External Document

## 2010-12-16 NOTE — Consult Note (Signed)
Summary: [place] [facility]  [facility]   Imported By: Konrad Dolores 08/11/2007 16:19:25  _____________________________________________________________________  External Attachment:    Type:   Image     Comment:   External Document

## 2010-12-16 NOTE — Progress Notes (Signed)
Summary: please call  Phone Note Call from Patient   Summary of Call: [name] would like for you to call her after her mother's appt. she is concerned about her mother.  [phone]. Initial call taken by: Eliezer Mccoy,  June 18, 2008 8:28 AM  Follow-up for Phone Call        Updated daughter on what we would do for her mom today. Follow-up by: Deidre Ala,  June 18, 2008 11:34 AM

## 2010-12-16 NOTE — Miscellaneous (Signed)
Summary: Orders Update surgical  Clinical Lists Changes  Orders: Added new Referral order of Surgical Referral (Surgery) - Signed

## 2010-12-16 NOTE — Assessment & Plan Note (Signed)
Summary: one month f/u...cjl   Vital Signs:  Patient Profile:   77 Years Old Female Height:     64 inches Weight:      135 pounds BMI:     23.26 O2 Sat:      98 % Temp:     97.5 degrees F Pulse rate:   88 / minute Resp:     14 per minute BP sitting:   107 / 64  Vitals Entered By: Deidre Ala (May 09, 2007 9:51 AM)               PCP:  [name], MD  Chief Complaint:  follow up visit.  History of Present Illness: She is on for recheck.  She had lab after her last visit and results are reviewed:  1. CBC - normal 2. LFTs: Normal 3. Lipids: See below  She was started on Lipitor. She took this and has done well. Notes she had some RUQ pain with this occasionally. Describes as an ache. Notes pain was 4/10. Localized. Also had some suprapubic pain with it. She denies muscle aches. She has occasional nausea and notes this nis worse with meals. Sx also with fatty foods. She has no diarrhea and states she does have constipation when she takes her calcium supplement. Notes easily resolves with Milk of Mag. She had gallbladder ultrasound and this was normal. Apetite is good.  She has a hx of upper GI bleed. No further problems. She continues on Nexium for this. Had large Mallory weis tears based on the endoscopy done in [place] per record review. No dark tarry stools. Back on regular diet.  Now presents.  Diabetes Management History:      The patient is a 77 years old female who comes in for evaluation of DM Type 2.  She is (or has been) enrolled in the "Diabetic Education Program".  She states understanding of dietary principles and is following her diet appropriately.  Sensory loss is noted.  Self foot exams are being performed.  She is checking home blood sugars.  She says that she is not exercising regularly.        Other comments include: FSBS twice daily - 72 this morning.        There are no symptoms to suggest diabetic complications.  Since her last visit, no  infections have occurred.  She's had no treatment plan problems.  No changes have been made to her treatment plan since last visit.    Hypertension History:      She denies headache, chest pain, palpitations, dyspnea with exertion, orthopnea, PND, peripheral edema, visual symptoms, neurologic problems, syncope, and side effects from treatment.  She notes no problems with any antihypertensive medication side effects.        Positive major cardiovascular risk factors include female age 78 years old or older, diabetes, hyperlipidemia, hypertension, and current tobacco user.  Negative major cardiovascular risk factors include negative family history for ischemic heart disease.        Further assessment for target organ damage reveals no history of ASHD, stroke/TIA, or peripheral vascular disease.    Lipid Management History:      Positive NCEP/ATP III risk factors include female age 28 years old or older, diabetes, current tobacco user, and hypertension.  Negative NCEP/ATP III risk factors include no history of early menopause without estrogen hormone replacement, no family history for ischemic heart disease, no ASHD (atherosclerotic heart disease), no prior stroke/TIA, no peripheral vascular disease, and no  history of aortic aneurysm.        The patient states that she knows about the "Therapeutic Lifestyle Change" diet.  Her compliance with the TLC diet is fair.       Current Allergies (reviewed today): ! 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 Lungs are clear to auscultation, no crackles or wheezes. Heart:     Normal rate and regular rhythm. S1 and S2 normal without gallop, murmur, click, rub or other extra sounds. Abdomen:     Bowel sounds positive,abdomen soft and non-tender without masses, organomegaly or hernias noted. Extremities:     No clubbing, cyanosis, edema, or deformity noted with normal full range of motion of all joints.   Cervical Nodes:     No lymphadenopathy noted Psych:     Cognition and judgment appear intact. Alert and cooperative with normal attention span and concentration. No apparent delusions, illusions, hallucinations    Impression & Recommendations:  Problem # 1:  UPPER GASTROINTESTINAL HEMORRHAGE (ICD-578.9) Discussed. Chart review shows this result of mallory weiss tear. Advised of this and discussed etiology and treatment. She can resume her baby aspirin and we will recheck HGB in 6 weeks. Advised if any side-effects including epigastric pain, nausea to [place] immediately and call me. She agrees. Discussed reason we use aspirin (EC) to aide in prevention of CVA and MI given her multiple risk factors for atherosclerosis. She is agreeable.  Problem # 2:  RUQ PAIN (ICD-789.01) Refer Hida Scan. Check LFTS.   Problem # 3:  HYPERTENSION (ICD-401.9) At goal. Meds as is. Councelled diet, exersize and low salt intake as well as yearly eye exam. Her updated medication list for this problem includes:    Indapamide 1.25 Mg Tabs (Indapamide) .....  Once daily    Lotrel 5-10 Mg Caps (Amlodipine besy-benazepril hcl) ..... Once daily   Problem # 4:  HYPERLIPIDEMIA (ICD-272.4) On Lipitor. Tolerating well. Some sx of RUQ pain when she takes this. Repeat LFTs. Check HIDA scan. if LFTS up will need to DC statin - note LFTS up on Tricor and Actos before. Pt agrees.  Her updated medication list for this  problem includes:    Lipitor 10 Mg Tabs (Atorvastatin calcium) ..... One daily   Problem # 5:  DIABETES MELLITUS, TYPE II, CONTROLLED, WITH COMPLICATIONS (CHE-035.24) Stable. Meds as is. Her updated medication list for this problem includes:    Lotrel 5-10 Mg Caps (Amlodipine besy-benazepril hcl) ..... Once daily    Aspir-low 81 Mg Tbec (Aspirin) ..... On hold    Glyburide-metformin 5-500 Mg Tabs (Glyburide-metformin) .Marland Kitchen... 2 in am and one at night   Problem # 6:  OSTEOPENIA (ICD-733.90) Calcium constipating. Advised fruits, veggies and adequate H2O intake. May want to try Citracel as this is supposedly least constipating of these agents. She agrees. Her updated medication list for this problem includes:    Oyst-cal D 250-125 Mg-unit Tabs (Calcium carbonate-vitamin d) .Marland Kitchen... Three times a day   Medications Added to Medication List This Visit: 1)  Lipitor 10 Mg Tabs (Atorvastatin calcium) .... One daily  Diabetes Management Assessment/Plan:      The following lipid goals have been established for the patient: Total cholesterol goal of 200; LDL cholesterol goal of 100; HDL cholesterol goal of 40; Triglyceride goal of 150.  Her blood pressure goal is < 130/80.    Hypertension Assessment/Plan:      The patient's hypertensive risk group is category C: Target organ damage and/or diabetes.  Her calculated 10 year risk of coronary heart disease is 15 %.  Today's blood pressure is 107/64.  Her blood pressure goal is < 130/80.  Lipid Assessment/Plan:      Based on NCEP/ATP III, the patient's risk factor category is "history of diabetes".  From  this information, the patient's calculated lipid goals are as follows: Total cholesterol goal is 200; LDL cholesterol goal is 100; HDL cholesterol goal is 40; Triglyceride goal is 150.     Patient Instructions: 1)  Please schedule a follow-up appointment in 6 weeks.   Appended Document: Orders Update    Clinical Lists Changes  Orders: Added new Service order of Venipuncture [phone]) - Signed Added new Test order of T-Hepatic Function [phone]) - Signed

## 2010-12-16 NOTE — Medication Information (Signed)
Summary: RX Folder  RX Folder   Imported By: Durwin Reges 10/28/2007 14:34:03  _____________________________________________________________________  External Attachment:    Type:   Image     Comment:   External Document

## 2010-12-16 NOTE — Letter (Signed)
Summary: Historic Referral  Historic Referral   Imported By: Otila Kluver, LPN 39/01/91 33:00:76  _____________________________________________________________________  External Attachment:    Type:   Image     Comment:   External Document

## 2010-12-16 NOTE — Assessment & Plan Note (Signed)
Summary: 2 MONTH FOLLOW UP/ARC   Vital Signs:  Patient Profile:   77 Years Old Female Height:     64 inches Weight:      135 pounds BMI:     23.26 Pulse rate:   86 / minute Resp:     10 per minute BP sitting:   145 / 78  Vitals Entered By: Deidre Ala (October 22, 2008 9:28 AM)                 PCP:  [name], MD  Chief Complaint:  follow up visit.  History of Present Illness: Pt in for recheck.  She needs recheck on her DM, HTN and HYperlipidemia.  She notes she is doing well and has nothing to complain about.   She is up to date for flu and pneumovax and she notes son told her to ask about H1N1 shot. She would like to have this is we have them.  She has a hx of osteopenia based on Dexa 2 years ago. She is agreeable with referal. She denieas falls and injuries. She is taking Calcium and VIt two times a day and states have fallen off from exersizing daily. States lazy last two weeks and states will get back on this.   She adds she stopped Zoloft. SHe is on Alprazolam. She uses latter two times a day and has done wonderful. She is not as tremulous and her anxiety is a lot  better. She is sleeping well. She is not as irritable as before and energy is good. Her concentration is good. No suicidal ideations. Little stress at this time. Good support in son.   She now presents.  Diabetes Management History:      The patient is a 77 years old female who comes in for evaluation of DM Type 2.  She is (or has been) enrolled in the "Diabetic Education Program".  She states understanding of dietary principles and is following her diet appropriately.  No sensory loss is reported.  Self foot exams are being performed.  She is checking home blood sugars.  She says that she is not exercising regularly.        Hypoglycemic symptoms are not occurring.  No hyperglycemic symptoms are reported.  Other comments include: States BS up a little - 154 this am. She is trying to watch diet but  hard with holidays. SHe states will do better.  States taking RX daily. Watching sugars normally but has slipped this holiday season.    Hypertension History:      She denies headache, chest pain, palpitations, dyspnea with exertion, orthopnea, PND, peripheral edema, visual symptoms, neurologic problems, syncope, and side effects from treatment.  She notes no problems with any antihypertensive medication side effects.  Further comments include: Has not taken BP Rx yet today and states fasting this am. Watching salt. BP usually well controlled.        Positive major cardiovascular risk factors include female age 42 years old or older, diabetes, hyperlipidemia, hypertension, and current tobacco user.  Negative major cardiovascular risk factors include negative family history for ischemic heart disease.        Further assessment for target organ damage reveals no history of ASHD, stroke/TIA, or peripheral vascular disease.    Lipid Management History:      Positive NCEP/ATP III risk factors include female age 62 years old or older, diabetes, current tobacco user, and hypertension.  Negative NCEP/ATP III risk factors include no history of  early menopause without estrogen hormone replacement, no family history for ischemic heart disease, no ASHD (atherosclerotic heart disease), no prior stroke/TIA, no peripheral vascular disease, and no history of aortic aneurysm.        The patient states that she knows about the "Therapeutic Lifestyle Change" diet.  Her compliance with the TLC diet is fair.  The patient expresses understanding of adjunctive measures for cholesterol lowering.  Adjunctive measures started by the patient include aerobic exercise, fiber, and ASA.  She expresses no side effects from her lipid-lowering medication.  The patient denies any symptoms to suggest myopathy or liver disease from her "statin" therapy.         Prior Medications Reviewed Using: Patient Recall  Updated Prior Medication  List: ALPRAZOLAM 0.25 MG TABS (ALPRAZOLAM) two times a day as needed INDAPAMIDE 1.25 MG TABS (INDAPAMIDE) once daily LOTREL 5-10 MG CAPS (AMLODIPINE BESY-BENAZEPRIL HCL) Once daily TYLENOL EXTRA STRENGTH 500 MG TABS (ACETAMINOPHEN) as needed ASPIR-LOW 81 MG TBEC (ASPIRIN) On hold CALTRATE 600+D PLUS 600-400 MG-UNIT  TABS (CALCIUM CARBONATE-VIT D-MIN) two times a day GLYBURIDE-METFORMIN 5-500 MG TABS (GLYBURIDE-METFORMIN) 2 in am and 2 at night [BMN] NEXIUM 40 MG CPDR (ESOMEPRAZOLE MAGNESIUM) One daily LIPITOR 10 MG  TABS (ATORVASTATIN CALCIUM) One daily ACCU-CHEK MULTICLIX LANCETS   MISC (LANCETS) use as directed * DM SHOES DX: NIDDM with foot callous * LEFT BREAST PROSTHESIS DX. Hx of of breast cancer and last prosthesis fitted 10 years DIASENSE MULTIVITAMIN  TABS (MULTIPLE VITAMINS-MINERALS) One daily ZOLOFT 50 MG TABS (SERTRALINE HCL) One daily at bedtime  Current Allergies (reviewed today): ! PCN  Past Medical History:    Reviewed history from 01/24/2008 and no changes required:       Current Problems:        UPPER GASTROINTESTINAL HEMORRHAGE (ICD-578.9)       MALAISE AND FATIGUE (ICD-780.79)       TOBACCO ABUSE (ICD-305.1)       * PERIPHEAL ARTERY DISEASE       * PAROTID TUMOR/ R       NEOPLASM, MALIGNANT, BREAST, HX OF (ICD-V10.3)       DIABETES MELLITUS, TYPE II, UNCONTROLLED (ICD-250.02)       OSTEOPENIA (ICD-733.90)       HYPERTENSION (ICD-401.9)       HYPERLIPIDEMIA (ICD-272.4)       DEPRESSION (ICD-311)       ANXIETY (ICD-300.00)         Past Surgical History:    Reviewed history from 01/24/2008 and no changes required:       L mastectomy       TAH - precancerous lesion cervix or uterus - pt not sure.       Upper GI - Early 2008  - gastropathy       Lap Choly July 15, 2007 - Dr. 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  Resp      Denies cough, shortness of breath, sputum productive, and wheezing.  GI      Denies abdominal pain, constipation, diarrhea, nausea, and vomiting.  GU      Denies nocturia, urinary frequency, and urinary hesitancy.   Physical Exam  General:     Well-developed,well-nourished,in no acute distress; alert,appropriate and cooperative throughout examination Lungs:     Normal respiratory effort, chest expands symmetrically. Lungs are clear to auscultation, no crackles or wheezes. Heart:     Normal rate and regular rhythm. S1 and S2 normal without gallop, murmur, click, rub or other extra sounds. Abdomen:     Bowel sounds positive,abdomen soft and non-tender without masses, organomegaly or hernias noted. Extremities:     No clubbing, cyanosis, edema, or deformity noted with normal full range of motion of all joints.   Cervical Nodes:     No lymphadenopathy noted Psych:     Cognition and judgment appear intact. Alert and cooperative with normal attention span and concentration. No apparent delusions, illusions, hallucinations  Diabetes Management Exam:    Foot Exam (with socks and/or shoes not present):       Sensory-Pinprick/Light touch:          Left medial foot (L-4): normal          Left dorsal foot (L-5): normal          Left lateral foot (S-1): normal          Right medial foot (L-4): normal          Right dorsal foot (L-5):  normal          Right lateral foot (S-1): normal       Sensory-Monofilament:          Left foot: normal          Right foot: normal       Inspection:          Left foot: normal          Right foot: normal       Nails:          Left foot: normal          Right foot: normal    Impression & Recommendations:  Problem # 1:  HYPERLIPIDEMIA (ICD-272.4) Repeat CMP to assure LFTS stable on high risk meds. Rx as is with TLC a must.  Her updated medication list for this problem includes:    Lipitor 10 Mg Tabs (Atorvastatin calcium) ..... One daily  Orders: T-Comprehensive Metabolic Panel ([id number])   Problem # 2:  HYPERTENSION (ICD-401.9) BP high this am. Has not taken Rx. States never misses dose but did this am. Resume and recheck 3 months. Advised TLC with diet, exersize and low salt intake. Her updated medication list for this problem includes:    Indapamide 1.25 Mg Tabs (Indapamide) ..... Once daily    Lotrel 5-10 Mg Caps (Amlodipine besy-benazepril hcl) ..... Once daily  Orders: T-Comprehensive Metabolic Panel ([id number])   Problem #  3:  DIABETES MELLITUS, TYPE II, CONTROLLED (ICD-250.00) A1C at 7.1. States wants to work on diet and exersize before adding more meds. Aware of risk and benefit of early Rx and keeping A1C less than 7.  States knows but wants to watch diet and exersize a little closer first. Agree. Check feet daily.  Medications:    Lotrel 5-10 Mg Caps (Amlodipine besy-benazepril hcl) ..... Once daily    Aspir-low 81 Mg Tbec (Aspirin) ..... On hold    Glyburide-metformin 5-500 Mg Tabs (Glyburide-metformin) .Marland Kitchen... 2 in am and 2 at night  Orders: Capillary Blood Glucose (82948) Hemoglobin A1C (81856) T-Comprehensive Metabolic Panel ([id number])  Her updated medication list for this problem includes:    Lotrel 5-10 Mg Caps (Amlodipine besy-benazepril hcl) ..... Once daily    Aspir-low 81 Mg Tbec (Aspirin) ..... On hold    Glyburide-metformin 5-500 Mg Tabs  (Glyburide-metformin) .Marland Kitchen... 2 in am and 2 at night   Problem # 4:  OSTEOPENIA (ICD-733.90) Repeat Dexa. Cont Caltrate and D as well as fall precautions.  Await lab and optomize. Her updated medication list for this problem includes:    Caltrate 600+d Plus 600-400 Mg-unit Tabs (Calcium carbonate-vit d-min) .Marland Kitchen..Marland Kitchen Two times a day  Orders: Radiology Referral (Radiology)   Problem # 5:  TOBACCO ABUSE (ICD-305.1) Again councelled. Working on cutting abck. Not open to Rx. Aware of death risk and other complicatons associated with smoking and also aware of Rx options.   Problem # 6:  ANXIETY (ICD-300.00) Stable. Off Zoloft. Xanax as needed only. Councelled stress coping skills. The following medications were removed from the medication list:    Zoloft 50 Mg Tabs (Sertraline hcl) ..... One daily at bedtime  Her updated medication list for this problem includes:    Alprazolam 0.25 Mg Tabs (Alprazolam) .Marland Kitchen..Marland Kitchen Two times a day as needed   Problem # 7:  NEED PROPHYLACTIC VACCINATION&INOCULATION FLU (ICD-V04.81) Update H1N1. Advised risk and benefit. Gave VIS. Update if any concerns. Agrees. Orders: Influenza A (H1N1) w/ Phy couseling (C5885)   Complete Medication List: 1)  Alprazolam 0.25 Mg Tabs (Alprazolam) .... Two times a day as needed 2)  Indapamide 1.25 Mg Tabs (Indapamide) .... Once daily 3)  Lotrel 5-10 Mg Caps (Amlodipine besy-benazepril hcl) .... Once daily 4)  Tylenol Extra Strength 500 Mg Tabs (Acetaminophen) .... As needed 5)  Aspir-low 81 Mg Tbec (Aspirin) .... On hold 6)  Caltrate 600+d Plus 600-400 Mg-unit Tabs (Calcium carbonate-vit d-min) .... Two times a day 7)  Glyburide-metformin 5-500 Mg Tabs (Glyburide-metformin) .... 2 in am and 2 at night 8)  Nexium 40 Mg Cpdr (Esomeprazole magnesium) .... One daily 9)  Lipitor 10 Mg Tabs (Atorvastatin calcium) .... One daily 10)  Accu-chek Multiclix Lancets Misc (Lancets) .... Use as directed 11)  Dm Shoes  .... Dx: niddm with foot  callous 12)  Left Breast Prosthesis  .... Dx. hx of of breast cancer and last prosthesis fitted 10 years 13)  Diasense Multivitamin Tabs (Multiple vitamins-minerals) .... One daily  Diabetes Management Assessment/Plan:      The following lipid goals have been established for the patient: Total cholesterol goal of 200; LDL cholesterol goal of 100; HDL cholesterol goal of 40; Triglyceride goal of 150.  Her blood pressure goal is < 130/80.    Hypertension Assessment/Plan:      The patient's hypertensive risk group is category C: Target organ damage and/or diabetes.  Her calculated 10 year risk of coronary heart disease is 27 %.  Today's blood  pressure is 145/78.  Her blood pressure goal is < 130/80.  Lipid Assessment/Plan:      Based on NCEP/ATP III, the patient's risk factor category is "history of diabetes".  From this information, the patient's calculated lipid goals are as follows: Total cholesterol goal is 200; LDL cholesterol goal is 100; HDL cholesterol goal is 40; Triglyceride goal is 150.     Patient Instructions: 1)  Please schedule a follow-up appointment in 3 months.   ] Laboratory Results   Blood Tests     Glucose (random): 208 mg/dL   (Normal Range: 70-105) HGBA1C: 7.1%   (Normal Range: Non-Diabetic - 3-6%   Control Diabetic - 6-8%)      Preventive Care Screening  Last Flu Shot:    Next Due:  08/2009       Appended Document: 2 MONTH FOLLOW UP/ARC Pt scheduled for Dexascan on 14 Dec 09 at 2:00.  Register at 1:45 at [employer] at [address]  Granville Lewis Document: 2 [place] patient of the above.

## 2010-12-16 NOTE — Assessment & Plan Note (Signed)
Summary: EPH/JML   Visit Type:  EPH Primary Provider:  [name], MD  CC:  Fatigue and mild SOB.  History of Present Illness: 77 yo AAF with history of DM, HTN, hyperlipidemia, PAD, tobacco abuse admitted to [facility]  May 13,2011 with a syncopal episode. Her troponin was elevated at 0.96 and her D-dimer was elevated at 0.93.  CT angiogram chest without evidence of PE. EKG with T wave inversions anterior and inferior. Cardiac cath on 03/31/10 with mild disease in the LAD but no obstructive lesions. Echo with normal LV size and function with mild AI. Carotid dopplers normal. Her CT chest did show centrilobular emphysema.   She is here today for hospital follow up. She has been doing well. She has some SOB and fatigue. NO chest pain. No recurrent syncope. She denies palpitations, dizziness, lower ext edema.   Old records reviewed in detail and documented below.   Anticoagulation Management History:      Positive risk factors for bleeding include an age of 37 years or older, history of GI bleeding, and presence of serious comorbidities.  Negative risk factors for bleeding include no history of CVA/TIA.  The bleeding index is 'high risk'.  Positive CHADS2 values include History of HTN, Age > 68 years old, and History of Diabetes.  Negative CHADS2 values include Prior Stroke/CVA/TIA.     Current Medications (verified): 1)  Alprazolam 0.25 Mg Tabs (Alprazolam) .... Two Times A Day As Needed 2)  Indapamide 1.25 Mg Tabs (Indapamide) .... Once Daily 3)  Lotrel 5-20 Mg Caps (Amlodipine Besy-Benazepril Hcl) .Marland Kitchen.. 1 Cap Once Daily 4)  Tylenol Extra Strength 500 Mg Tabs (Acetaminophen) .... As Needed 5)  Aspirin Ec 325 Mg Tbec (Aspirin) .... Take One Tablet By Mouth Daily 6)  Caltrate 600+d Plus 600-400 Mg-Unit  Tabs (Calcium Carbonate-Vit D-Min) .... Two Times A Day 7)  Glyburide-Metformin 5-500 Mg Tabs (Glyburide-Metformin) .... 2 in Am and 2 At Night 8)  Nexium 40 Mg Cpdr (Esomeprazole  Magnesium) .... One Daily 9)  Lipitor 10 Mg  Tabs (Atorvastatin Calcium) .... One Daily 10)  Accu-Chek Multiclix Lancets   Misc (Lancets) .... Use As Directed 11)  Dm Shoes .... Dx: Niddm With Foot Callous 12)  Left Breast Prosthesis .... Dx. Hx of of Breast Cancer and Last Prosthesis Fitted 10 Years 27)  Diasense Multivitamin  Tabs (Multiple Vitamins-Minerals) .... One Daily 14)  Januvia 100 Mg Tabs (Sitagliptin Phosphate) .... Take 1 Tablet By Mouth Once A Day 15)  Nicotine 21 Mg/24hr Pt24 (Nicotine) .... Use As Directed 16)  Wellbutrin Sr 150 Mg Xr12h-Tab (Bupropion Hcl) .Marland Kitchen.. 1 Tab Once Daily  Allergies: 1)  ! Pcn  Past History:  Past Medical History: Current Problems:  UPPER GASTROINTESTINAL HEMORRHAGE (ICD-578.9) MALAISE AND FATIGUE (ICD-780.79) TOBACCO ABUSE (ICD-305.1) * PAROTID TUMOR/ R NEOPLASM, MALIGNANT, BREAST, HX OF (ICD-V10.3) DIABETES MELLITUS, TYPE II, UNCONTROLLED (ICD-250.02) OSTEOPENIA (ICD-733.90) HYPERTENSION (ICD-401.9) HYPERLIPIDEMIA (ICD-272.4) DEPRESSION (ICD-311) ANXIETY (ICD-300.00)  Past Surgical History: Reviewed history from 05/07/2009 and no changes required. 1. L mastectomy related to breast cancer 2. TAH - precancerous lesion cervix or uterus - pt not sure. 3. Upper GI - Early 2008  - gastropathy 4. Lap Choly July 15, 2007 - Dr. Grandville Silos - Decreased EF on HIDA  Family History: Reviewed history from 05/07/2009 and no changes required. Father: Dead 18 - lung/brain cancer Mother: Dead 87 - CHF Siblings: Sister 4 - Kidney cancer Children - 4 total - 1 boy and 3 girls - son  age 98 DM, HTN and hx of meningitis - disabled at age 65 and hx of menigitis, daughters are 25, 64, 34 - healthy  Social History: Reviewed history from 05/07/2009 and no changes required. 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  Stopped 03/31/10.  Started age 85.  Alcohol use-no Drug use-no Lives with son Education: 12 th grade  Review of Systems       The patient complains of fatigue and shortness of breath.  The patient denies malaise, fever, weight gain/loss, vision loss, decreased hearing, hoarseness, chest pain, palpitations, prolonged cough, wheezing, sleep apnea, coughing up blood, abdominal pain, blood in stool, nausea, vomiting, diarrhea, heartburn, incontinence, blood in urine, muscle weakness, joint pain, leg swelling, rash, skin lesions, headache, fainting, dizziness, depression, anxiety, enlarged lymph nodes, easy bruising or bleeding, and environmental allergies.    Vital Signs:  Patient profile:   77 year old female Height:      64 inches Weight:      141 pounds BMI:     24.29 Pulse rate:   78 / minute Pulse rhythm:   irregular BP sitting:   120 / 70  (left arm) Cuff size:   regular  Vitals Entered By: Julaine Hua, CMA (Apr 15, 2010 9:47 AM)  Physical Exam  General:  General: Well developed, well nourished, NAD HEENT: OP clear, mucus membranes moist SKIN: warm, dry Neuro: No focal deficits Musculoskeletal: Muscle strength 5/5 all ext Psychiatric: Mood and affect normal Neck: No JVD, no carotid bruits, no thyromegaly, no lymphadenopathy. Lungs:Decreased breath sounds  bilaterally, no wheezes, rhonci, crackles CV: RRR no murmurs, gallops rubs Abdomen: soft, NT, ND, BS present Extremities: No edema, pulses 2+.    EKG  Procedure date:  04/15/2010  Findings:      NSR, rate 78 bpm. Biatrial enlargement.  RV conduction delay.  T wave inversions inferior and anterior leads.  Cardiac Cath  Procedure date:  03/31/2010  Findings:       1. Left main coronary artery:  Left main coronary was free of       significant disease.   2. Left anterior descending artery:  Left anterior descending artery       gave rise to a diagonal branch and several septal perforators.  The       LAD was  irregular with 40% narrowing in the midportion, but there       was no obstructive disease.   3. Circumflex artery:  Circumflex artery gave rise to an atrial       branch, a marginal branch, and 2 large posterolateral branches.       These vessels were free of significant disease.   4. Right coronary artery:  Right coronary was a small codominant       vessel that gave rise to a right ventricular branch and a posterior       descending branch.  There were some irregularities, but there was       no significant obstruction.   5. Left ventriculogram:  Left ventriculogram performed in the RAO       projection showed vigorous wall motion with no areas of       hypokinesis.  There appeared to be furrowing of the mitral valve.      Echocardiogram  Procedure date:  03/30/2010  Findings:  Left ventricle: The cavity size was normal. Wall thickness was     increased in a pattern of mild LVH. Systolic function was     hyperdynamic. The estimated ejection fraction was in the range of     70% to 75%. Wall motion was normal; there were no regional wall     motion abnormalities.   - Aortic valve: Mildly calcified annulus. Trileaflet; moderately     calcified leaflets. Mild regurgitation.   - Left atrium: The atrium was mildly dilated.   - Right atrium: The atrium was mildly dilated.   - Atrial septum: No defect or patent foramen ovale was identified.   - Tricuspid valve: Mild diffuse leaflet thickening. Mild-moderate     regurgitation.   - Pulmonary arteries: Systolic pressure was mildly increased. PA     peak pressure: 70m Hg (S).  Impression & Recommendations:  Problem # 1:  CORONARY ATHEROSCLEROSIS NATIVE CORONARY ARTERY (ICD-414.01) Recent NSTEMI. No evidence of obstructive CAD by  Will continue ASA and statin. This could have been associated with vasospasm. Will continue Norvasc.   Her updated medication list for this problem includes:    Lotrel 5-20 Mg Caps (Amlodipine  besy-benazepril hcl) ..Marland Kitchen.. 1 cap once daily    Aspirin Ec 325 Mg Tbec (Aspirin) ..Marland Kitchen.. Take one tablet by mouth daily  Orders: EKG w/ Interpretation (93000)  Problem # 2:  HYPERTENSION (ICD-401.9) Well controlled. Continue current therapy.   Her updated medication list for this problem includes:    Indapamide 1.25 Mg Tabs (Indapamide) ..... Once daily    Lotrel 5-20 Mg Caps (Amlodipine besy-benazepril hcl) ..Marland Kitchen.. 1 cap once daily    Aspirin Ec 325 Mg Tbec (Aspirin) ..Marland Kitchen.. Take one tablet by mouth daily  Problem # 3:  SYNCOPE AND COLLAPSE (ICD-780.2) No clear etiology.  Likely vasovagal.   Her updated medication list for this problem includes:    Lotrel 5-20 Mg Caps (Amlodipine besy-benazepril hcl) ..Marland Kitchen.. 1 cap once daily    Aspirin Ec 325 Mg Tbec (Aspirin) ..Marland Kitchen.. Take one tablet by mouth daily  Anticoagulation Management Assessment/Plan:            Patient Instructions: 1)  Your physician recommends that you schedule a follow-up appointment in: 6 months 2)  Your physician recommends that you continue on your current medications as directed. Please refer to the Current Medication list given to you today.

## 2010-12-16 NOTE — Letter (Signed)
Summary: [facility]  [facility]   Imported By: Otila Kluver, LPN [id number]:[id number]  _____________________________________________________________________  External Attachment:    Type:   Image     Comment:   External Document

## 2010-12-16 NOTE — Assessment & Plan Note (Signed)
Summary: follow up 1 month/slj   Vital Signs:  Patient Profile:   77 Years Old Female Height:     64 inches Weight:      136 pounds BMI:     23.43 O2 Sat:      99 % Temp:     97.9 degrees F Pulse rate:   82 / minute Resp:     12 per minute BP sitting:   130 / 67  Vitals Entered By: Deidre Ala (September 10, 2008 10:01 AM)                 PCP:  [name], MD  Chief Complaint:  follow up visit.  History of Present Illness: Pt in for recheck on her deprssion and anxiety.  She is using Zoloft and has used for 6 weeks now. She denies side-effect on this and uses at bedtime. She states she sleeps good with this. Feels more rested. She is less irritable. Her concentration is good. Energy is better as she sleeps better. She denies suicidal ideations. She states her tremors are a lot better since starting Rx and state she knew was nerves all along. She states stressors are better and adds sister not bothering her about deaceasedmom's estate. She states she has limit emotional support but does talk to kids on phone daily. Has few friends she confides in and states has few rally good neighbors she can talk to.   Only concern today is left hip. States gets pain in at times and states would rate as 10/10 at times. She denies falls and injuries and states pain does radiate to rectum and somewhat to groin. She has not had xrays on her hip. States has felt like leg wants to give out. She denies injury. SHe is not using anything for pain but some Tylenol Artheritis and states has used once a day only.   She now presents.  Lipid Management History:      Positive NCEP/ATP III risk factors include female age 18 years old or older, diabetes, current tobacco user, and hypertension.  Negative NCEP/ATP III risk factors include no history of early menopause without estrogen hormone replacement, no family history for ischemic heart disease, no ASHD (atherosclerotic heart disease), no prior  stroke/TIA, no peripheral vascular disease, and no history of aortic aneurysm.        The patient states that she knows about the "Therapeutic Lifestyle Change" diet.  Her compliance with the TLC diet is fair.       Prior Medications Reviewed Using: Patient Recall  Updated Prior Medication List: ALPRAZOLAM 0.25 MG TABS (ALPRAZOLAM) two times a day as needed INDAPAMIDE 1.25 MG TABS (INDAPAMIDE) once daily LOTREL 5-10 MG CAPS (AMLODIPINE BESY-BENAZEPRIL HCL) Once daily TYLENOL EXTRA STRENGTH 500 MG TABS (ACETAMINOPHEN) as needed ASPIR-LOW 81 MG TBEC (ASPIRIN) On hold CALTRATE 600+D PLUS 600-400 MG-UNIT  TABS (CALCIUM CARBONATE-VIT D-MIN) two times a day GLYBURIDE-METFORMIN 5-500 MG TABS (GLYBURIDE-METFORMIN) 2 in am and 2 at night [BMN] NEXIUM 40 MG CPDR (ESOMEPRAZOLE MAGNESIUM) One daily LIPITOR 10 MG  TABS (ATORVASTATIN CALCIUM) One daily ACCU-CHEK MULTICLIX LANCETS   MISC (LANCETS) use as directed * DM SHOES DX: NIDDM with foot callous * LEFT BREAST PROSTHESIS DX. Hx of of breast cancer and last prosthesis fitted 10 years DIASENSE MULTIVITAMIN  TABS (MULTIPLE VITAMINS-MINERALS) One daily ZOLOFT 50 MG TABS (SERTRALINE HCL) One daily at bedtime  Current Allergies (reviewed today): ! PCN  Past Medical History:    Reviewed history from 01/24/2008  and no changes required:       Current Problems:        UPPER GASTROINTESTINAL HEMORRHAGE (ICD-578.9)       MALAISE AND FATIGUE (ICD-780.79)       TOBACCO ABUSE (ICD-305.1)       * PERIPHEAL ARTERY DISEASE       * PAROTID TUMOR/ R       NEOPLASM, MALIGNANT, BREAST, HX OF (ICD-V10.3)       DIABETES MELLITUS, TYPE II, UNCONTROLLED (ICD-250.02)       OSTEOPENIA (ICD-733.90)       HYPERTENSION (ICD-401.9)       HYPERLIPIDEMIA (ICD-272.4)       DEPRESSION (ICD-311)       ANXIETY (ICD-300.00)         Past Surgical History:    Reviewed history from 01/24/2008 and no changes required:       L mastectomy       TAH - precancerous lesion  cervix or uterus - pt not sure.       Upper GI - Early 2008  - gastropathy       Lap Choly July 15, 2007 - Dr. 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  Resp      Denies cough, shortness of breath, sputum productive, and wheezing.  GI      Denies abdominal pain, constipation, diarrhea, nausea, and vomiting.  GU      Denies nocturia, urinary frequency, and urinary hesitancy.   Physical Exam  General:     Well-developed,well-nourished,in no acute distress; alert,appropriate and cooperative throughout examination Lungs:     Normal respiratory effort, chest expands symmetrically. Lungs are clear to auscultation, no crackles or wheezes. Heart:     Normal rate and regular rhythm. S1 and S2 normal without gallop, murmur, click, rub or other extra sounds. Abdomen:     Bowel sounds positive,abdomen soft and non-tender without masses, organomegaly or hernias noted. Extremities:     No clubbing, cyanosis, edema, or deformity noted with normal full range of motion of all joints.  Left hip shows pain on extension, flexion and internal rotation with signs as in HPI reproduced. Psych:     Cognition and judgment appear intact. Alert and cooperative with normal attention span and concentration. No apparent delusions, illusions, hallucinations    Impression & Recommendations:  Problem # 1:  HIP PAIN, LEFT (ICD-719.45) OA per hx and  exam. Advised Tylebol ES 500 mg two three times a day as needed. Discussed Xray to determine degree and states would like to wait and see how she does on Tylenol  first. Advised leg and hip exersizes. Update if worse. Recheck 2 months and optomize. Her updated medication list for this problem includes:    Tylenol Extra Strength 500 Mg Tabs (Acetaminophen) .Marland Kitchen... As needed    Aspir-low 81 Mg Tbec (Aspirin) ..... On hold   Problem # 2:  DEPRESSION (ICD-311) Stable. Meds as below. Stress coping skills as is. Update if worse. Low dose Zoloft as is for now per her request. Her updated medication list for this problem includes:    Alprazolam 0.25 Mg Tabs (Alprazolam) .Marland Kitchen..Marland Kitchen Two times a day as needed    Zoloft 50 Mg Tabs (Sertraline hcl) ..... One daily at bedtime   Problem # 3:  TOBACCO ABUSE (ICD-305.1) Again councelled cessation. Advised risk and benefit and she notes will try on own.  Follow.  Problem # 4:  TREMOR, LEFT HAND (ICD-781.0) Stable and improved with optomization of anxiety/depression. Follow.   Complete Medication List: 1)  Alprazolam 0.25 Mg Tabs (Alprazolam) .... Two times a day as needed 2)  Indapamide 1.25 Mg Tabs (Indapamide) .... Once daily 3)  Lotrel 5-10 Mg Caps (Amlodipine besy-benazepril hcl) .... Once daily 4)  Tylenol Extra Strength 500 Mg Tabs (Acetaminophen) .... As needed 5)  Aspir-low 81 Mg Tbec (Aspirin) .... On hold 6)  Caltrate 600+d Plus 600-400 Mg-unit Tabs (Calcium carbonate-vit d-min) .... Two times a day 7)  Glyburide-metformin 5-500 Mg Tabs (Glyburide-metformin) .... 2 in am and 2 at night 8)  Nexium 40 Mg Cpdr (Esomeprazole magnesium) .... One daily 9)  Lipitor 10 Mg Tabs (Atorvastatin calcium) .... One daily 10)  Accu-chek Multiclix Lancets Misc (Lancets) .... Use as directed 11)  Dm Shoes  .... Dx: niddm with foot callous 12)  Left Breast Prosthesis  .... Dx. hx of of breast cancer and last prosthesis fitted 10 years 13)  Diasense Multivitamin Tabs  (Multiple vitamins-minerals) .... One daily 14)  Zoloft 50 Mg Tabs (Sertraline hcl) .... One daily at bedtime  Lipid Assessment/Plan:      Based on NCEP/ATP III, the patient's risk factor category is "history of diabetes".  From this information, the patient's calculated lipid goals are as follows: Total cholesterol goal is 200; LDL cholesterol goal is 100; HDL cholesterol goal is 40; Triglyceride goal is 150.     Patient Instructions: 1)  Please schedule a follow-up appointment in 2 months.   ] Last LDL:                                                 78 (05/01/2008 7:49:00 PM)

## 2010-12-16 NOTE — Assessment & Plan Note (Signed)
Summary: follow up 2 month/slj   Vital Signs:  Patient Profile:   77 Years Old Female Height:     64 inches Weight:      137 pounds BMI:     23.60 O2 Sat:      98 % Temp:     97.0 degrees F Pulse rate:   81 / minute Resp:     10 per minute BP sitting:   126 / 66  Vitals Entered By: Deidre Ala (May 07, 2008 9:10 AM)               Vision Comments: 03/2009   PCP:  [name], MD  Chief Complaint:  Recheck.  History of Present Illness: Pt in for recheck.  She needs recheck on DM, HTN and Hyperlipidemia.  She saw Dr. Gershon Crane in April and got clear. She did not need glasses chnaged and was advised rehceck yearly.  She had labs after last visit and this is reviewed:  1. CMP - BS 140, otherwise normal 2.LIpids - see below.  SHe has a hx of breast cancer. Needs new left sided prostehsis if possible  SHe now presents.    Diabetes Management History:      The patient is a 77 years old female who comes in for evaluation of DM Type 2.  She is (or has been) enrolled in the "Diabetic Education Program".  She states understanding of dietary principles and is following her diet appropriately.  No sensory loss is reported.  Self foot exams are being performed.  She is checking home blood sugars.  She says that she is not exercising regularly.        Hypoglycemic symptoms are not occurring.  No hyperglycemic symptoms are reported.  Other comments include: She does her FSBS once in am and if she has any sx. States BS running 81 this am when fasting.    Hypertension History:      She denies headache, chest pain, palpitations, dyspnea with exertion, orthopnea, PND, peripheral edema, visual symptoms, neurologic problems, syncope, and side effects from treatment.  She notes no problems with any antihypertensive medication side effects.  Further comments include: Watching salt. Not exersizing.        Positive major cardiovascular risk factors include female age 53 years old or  older, diabetes, hyperlipidemia, hypertension, and current tobacco user.  Negative major cardiovascular risk factors include negative family history for ischemic heart disease.        Further assessment for target organ damage reveals no history of ASHD, stroke/TIA, or peripheral vascular disease.    Lipid Management History:      Positive NCEP/ATP III risk factors include female age 73 years old or older, diabetes, current tobacco user, and hypertension.  Negative NCEP/ATP III risk factors include no history of early menopause without estrogen hormone replacement, no family history for ischemic heart disease, no ASHD (atherosclerotic heart disease), no prior stroke/TIA, no peripheral vascular disease, and no history of aortic aneurysm.        The patient states that she knows about the "Therapeutic Lifestyle Change" diet.  Her compliance with the TLC diet is fair.  The patient expresses understanding of adjunctive measures for cholesterol lowering.  Adjunctive measures started by the patient include aerobic exercise, fiber, ASA, and omega-3 supplements.  She expresses no side effects from her lipid-lowering medication.  The patient denies any symptoms to suggest myopathy or liver disease from her "statin" therapy.  Prior Medications Reviewed Using: Patient Recall  Updated Prior Medication List: ALPRAZOLAM 0.25 MG TABS (ALPRAZOLAM) two times a day INDAPAMIDE 1.25 MG TABS (INDAPAMIDE) once daily LOTREL 5-10 MG CAPS (AMLODIPINE BESY-BENAZEPRIL HCL) Once daily TYLENOL EXTRA STRENGTH 500 MG TABS (ACETAMINOPHEN) as needed ASPIR-LOW 81 MG TBEC (ASPIRIN) On hold CALTRATE 600+D PLUS 600-400 MG-UNIT  TABS (CALCIUM CARBONATE-VIT D-MIN) two times a day GLYBURIDE-METFORMIN 5-500 MG TABS (GLYBURIDE-METFORMIN) 2 in am and 2 at night [BMN] NEXIUM 40 MG CPDR (ESOMEPRAZOLE MAGNESIUM) One daily LIPITOR 10 MG  TABS (ATORVASTATIN CALCIUM) One daily ACCU-CHEK MULTICLIX LANCETS   MISC (LANCETS) use as  directed  Current Allergies (reviewed today): ! PCN  Past Medical History:    Reviewed history from 01/24/2008 and no changes required:       Current Problems:        UPPER GASTROINTESTINAL HEMORRHAGE (ICD-578.9)       MALAISE AND FATIGUE (ICD-780.79)       TOBACCO ABUSE (ICD-305.1)       * PERIPHEAL ARTERY DISEASE       * PAROTID TUMOR/ R       NEOPLASM, MALIGNANT, BREAST, HX OF (ICD-V10.3)       DIABETES MELLITUS, TYPE II, UNCONTROLLED (ICD-250.02)       OSTEOPENIA (ICD-733.90)       HYPERTENSION (ICD-401.9)       HYPERLIPIDEMIA (ICD-272.4)       DEPRESSION (ICD-311)       ANXIETY (ICD-300.00)         Past Surgical History:    Reviewed history from 01/24/2008 and no changes required:       L mastectomy       TAH - precancerous lesion cervix or uterus - pt not sure.       Upper GI - Early 2008  - gastropathy       Lap Choly July 15, 2007 - Dr. 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       Retired       Divorced       Current Smoker       Alcohol use-no       Drug use-no   Risk Factors:  Tobacco use:  current    Year started:  1980    Counseled to quit/cut down tobacco use:  yes Drug use:  no Alcohol use:  no Exercise:  no  Family History Risk Factors:    Family History of MI in females < 49 years old:  no    Family History of MI in males < 89 years old:  no  Colonoscopy History:    Date of Last Colonoscopy:  09/30/2004  Mammogram History:    Date of Last Mammogram:  01/17/2008  PAP Smear History:    Date of Last PAP Smear:  06/01/2006   Review of Systems      See HPI  General      Denies fatigue and malaise.  Resp      Denies  cough, shortness of breath, sputum productive, and wheezing.  GI  Denies abdominal pain, constipation, diarrhea, nausea, and vomiting.  GU      Denies nocturia, urinary frequency, and urinary hesitancy.  Neuro      Complains of tremors.      Denies brief paralysis, falling down, and seizures.      She has a new finding of left hand tremor. Worse with nervousness. States for last several months. She denies headache, blurry vision, weakness. She denies syncopy. States tremor only in rest. She can make worse when she gets anxious. Better when she relaxes or use her xanax. She notes father died of brain cancer. SHe had a normal head CT in 2007.  Psych      Complains of anxiety.      Denies depression, thoughts of violence, unusual visions or sounds, and thoughts /plans of harming others.      She has been more anxious. States her and her sister are not getting along. States trying to sell property and this has made her more anxious.  Endo      Denies cold intolerance, excessive hunger, excessive thirst, excessive urination, heat intolerance, polyuria, and weight change.   Physical Exam  General:     Well-developed,well-nourished,in no acute distress; alert,appropriate and cooperative throughout examination Lungs:     Normal respiratory effort, chest expands symmetrically. Lungs are clear to auscultation, no crackles or wheezes. Heart:     Normal rate and regular rhythm. S1 and S2 normal without gallop, murmur, click, rub or other extra sounds. Abdomen:     Bowel sounds positive,abdomen soft and non-tender without masses, organomegaly or hernias noted. Extremities:     No clubbing, cyanosis, edema, or deformity noted with normal full range of motion of all joints.   Neurologic:     No cranial nerve deficits noted. Station and gait are normal. Plantar reflexes are down-going bilaterally. DTRs are symmetrical throughout. Sensory, motor and coordinative functions appear  intact. Cervical Nodes:     No lymphadenopathy noted Psych:     Cognition and judgment appear intact. Alert and cooperative with normal attention span and concentration. No apparent delusions, illusions, hallucinations  Diabetes Management Exam:    Foot Exam (with socks and/or shoes not present):       Sensory-Monofilament:          Left foot: normal          Right foot: normal       Sensory-other: Sees Podiatry every 10 weeks for foot care and corn trimming       Inspection:          Left foot: normal          Right foot: abnormal             Comments: Callous right lateral little toe.       Nails:          Left foot: normal          Right foot: normal    Eye Exam:       Eye Exam done elsewhere          Date: 03/06/2008          Results: Pseudoophakia, OU          Done by: Dr. Gershon Crane    Impression & Recommendations:  Problem # 1:  DIABETES MELLITUS, TYPE II, UNCONTROLLED (ICD-250.02) Discussed. A1C down to 7.1. Discussed med addition vs TLC. She opts for 3 months of diet and exersize. Gave script for DM shoes with right little  toe callous and instructed daily foot check and cont to see Podiatry as scheduled. She is up to date for eye exam. Advised yearly recheck. Exersize a must. Her updated medication list for this problem includes:    Lotrel 5-10 Mg Caps (Amlodipine besy-benazepril hcl) ..... Once daily    Aspir-low 81 Mg Tbec (Aspirin) ..... On hold    Glyburide-metformin 5-500 Mg Tabs (Glyburide-metformin) .Marland Kitchen... 2 in am and 2 at night  Orders: Capillary Blood Glucose (82948) Hemoglobin A1C (83036) Venipuncture (69794)   Problem # 2:  TOBACCO ABUSE (ICD-305.1) Councelled cessation. Aware of risk and benefit. States not open to rx - will do on own and sights cost of cigs as main factor for quitting on own.  Problem # 3:  ANXIETY (ICD-300.00) Increase to two times a day as needed as has only used rately at bedtime. Dispute with sister about land she owns. Councelled  opn discussion and sress coping skills. Recheck 6 weeks. Her updated medication list for this problem includes:    Alprazolam 0.25 Mg Tabs (Alprazolam) .Marland Kitchen..Marland Kitchen Two times a day as needed   Problem # 4:  NEOPLASM, MALIGNANT, BREAST, HX OF (ICD-V10.3) Gave new script for breast prosthesis.  Problem # 5:  HYPERTENSION (ICD-401.9) Stable. Rx as is. TLC a must. Her updated medication list for this problem includes:    Indapamide 1.25 Mg Tabs (Indapamide) ..... Once daily    Lotrel 5-10 Mg Caps (Amlodipine besy-benazepril hcl) ..... Once daily   Problem # 6:  HYPERLIPIDEMIA (ICD-272.4) To goal. Rx as is. Her updated medication list for this problem includes:    Lipitor 10 Mg Tabs (Atorvastatin calcium) ..... One daily   Problem # 7:  TREMOR, LEFT HAND (ICD-781.0) New complaint. Not on exam today.Only when anxious. Chck CBC and TSH. Suspect intention tremor.Advied CT head with hx of breast cancer and fam hx of brain cancer. States will consider in 6 weeks if sx do not improve with stress resolve. Orders: T-CBC w/Diff ([id number]) T-TSH [id number])   Complete Medication List: 1)  Alprazolam 0.25 Mg Tabs (Alprazolam) .... Two times a day as needed 2)  Indapamide 1.25 Mg Tabs (Indapamide) .... Once daily 3)  Lotrel 5-10 Mg Caps (Amlodipine besy-benazepril hcl) .... Once daily 4)  Tylenol Extra Strength 500 Mg Tabs (Acetaminophen) .... As needed 5)  Aspir-low 81 Mg Tbec (Aspirin) .... On hold 6)  Caltrate 600+d Plus 600-400 Mg-unit Tabs (Calcium carbonate-vit d-min) .... Two times a day 7)  Glyburide-metformin 5-500 Mg Tabs (Glyburide-metformin) .... 2 in am and 2 at night 8)  Nexium 40 Mg Cpdr (Esomeprazole magnesium) .... One daily 9)  Lipitor 10 Mg Tabs (Atorvastatin calcium) .... One daily 10)  Accu-chek Multiclix Lancets Misc (Lancets) .... Use as directed 11)  Dm Shoes  .... Dx: niddm with foot callous 12)  Left Breast Prosthesis  .... Dx. hx of of breast cancer and last prosthesis  fitted 10 years  Diabetes Management Assessment/Plan:      The following lipid goals have been established for the patient: Total cholesterol goal of 200; LDL cholesterol goal of 100; HDL cholesterol goal of 40; Triglyceride goal of 150.  Her blood pressure goal is < 130/80.    Hypertension Assessment/Plan:      The patient's hypertensive risk group is category C: Target organ damage and/or diabetes.  Her calculated 10 year risk of coronary heart disease is 20 %.  Today's blood pressure is 126/66.  Her blood pressure goal is < 130/80.  Lipid Assessment/Plan:  Based on NCEP/ATP III, the patient's risk factor category is "history of diabetes".  From this information, the patient's calculated lipid goals are as follows: Total cholesterol goal is 200; LDL cholesterol goal is 100; HDL cholesterol goal is 40; Triglyceride goal is 150.     Patient Instructions: 1)  Please schedule a follow-up appointment in 6 weeks.   Prescriptions: LEFT BREAST PROSTHESIS DX. Hx of of breast cancer and last prosthesis fitted 10 years  #1 x 0   Entered and Authorized by:   [name] MD   Signed by:   [name] MD on 05/07/2008   Method used:   Faxed to ...       [facility]*       [place] [address]       [place], [place]  [address]       Ph: [phone]       Fax: [phone]   RxID:   [id number] DM SHOES DX: NIDDM with foot callous  #1 x 0   Entered and Authorized by:   [name] MD   Signed by:   [name] MD on 05/07/2008   Method used:   Faxed to ...       [facility]*       [place] [address]       [place], [place]  [address]       Ph: [phone]       Fax: [phone]   RxID:   [id number] [place] (LANCETS) use as directed  #102 x 2   Entered and Authorized by:   [name] MD   Signed by:   [name] MD on 05/07/2008   Method  used:   Electronically sent to ...       [facility] [address]       [place], [place]  [address]       Ph: [phone]       Fax: [phone]   RxID:   [id number] LIPITOR 10 MG  TABS (ATORVASTATIN CALCIUM) One daily  #30 x 3   Entered and Authorized by:   [name] MD   Signed by:   [name] MD on 05/07/2008   Method used:   Electronically sent to ...       [facility] [address]       [place], [place]  [address]       Ph: [phone]       Fax: [phone]   RxID:   [id number] NEXIUM 40 MG CPDR (ESOMEPRAZOLE MAGNESIUM) One daily  #30 x 6   Entered and Authorized by:   [name] MD   Signed by:   [name] MD on 05/07/2008   Method used:   Electronically sent to ...       [facility] [address]       [place], [place]  [address]       Ph: [phone]       Fax: [phone]   RxID:   [id number] GLYBURIDE-METFORMIN 5-500 MG TABS (GLYBURIDE-METFORMIN) 2 in am and 2 at night Brand medically  necessary #120 x 3   Entered and Authorized by:   [name] MD   Signed by:   [name] MD on 05/07/2008   Method used:   Electronically sent to ...       [facility] [address]       [place], [place]  [address]       Ph: [phone]       Fax: [phone]   RxID:   [id number] INDAPAMIDE 1.25 MG TABS (INDAPAMIDE) once daily  #30 x 3   Entered and Authorized by:   [name] MD   Signed by:   [name] MD on 05/07/2008   Method used:   Electronically sent to ...       [facility] [address]       [place], [place]  [address]       Ph: [phone]       Fax: [phone]   RxID:   [id number] ALPRAZOLAM 0.25 MG TABS (ALPRAZOLAM) two times a  day as needed  #60 x 0   Entered and Authorized by:   [name] MD   Signed by:   [name] MD on 05/07/2008   Method used:   Electronically sent to ...       [facility] [address]       [place], [place]  [address]       Ph: [phone]       Fax: [phone]   RxID:   [id number] LOTREL 5-10 MG CAPS (AMLODIPINE BESY-BENAZEPRIL HCL) Once daily  #30 x 3   Entered and Authorized by:   [name] MD   Signed by:   [name] MD on 05/07/2008   Method used:   Electronically sent to ...       [facility] [address]       [place],   [address]       Ph: [phone]       Fax: [phone]   RxID:   [id number]  ] Laboratory Results   Blood Tests     Glucose (random): 169 mg/dL   (Normal Range: 70-105) HGBA1C: 7.1%   (Normal Range: Non-Diabetic - 3-6%   Control Diabetic - 6-8%)

## 2010-12-16 NOTE — Consult Note (Signed)
Summary: Consultation Report-[facility]  Consultation Report-[facility]   Imported By: Konrad Dolores 07/11/2007 16:37:37  _____________________________________________________________________  External Attachment:    Type:   Image     Comment:   External Document

## 2010-12-16 NOTE — Progress Notes (Signed)
Summary: 10/21/07 lab results  Phone Note Outgoing Call   Call placed by: Druscilla Brownie,  October 24, 2007 11:23 AM Summary of Call: Results given to patient, voices understanding .................................................................Marland KitchenMarland KitchenDruscilla Brownie  October 24, 2007 11:23 AM   Initial call taken by: Druscilla Brownie,  October 24, 2007 11:23 AM

## 2010-12-16 NOTE — Assessment & Plan Note (Signed)
Summary: FOLLOW UP 3 MONTH/SLJ   Vital Signs:  Patient profile:   77 year old female Height:      64 inches Weight:      135 pounds O2 Sat:      99 % Temp:     98.3 degrees F Pulse rate:   74 / minute Resp:     14 per minute BP sitting:   130 / 65  Vitals Entered By: Deidre Ala LPN (February 06, 6044 40:98 AM) CC: Recheck., Hypertension Management, Lipid Management Is Patient Diabetic? No Nutritional Status BMI of 19 -24 = normal   Primary Nabilah Davoli:  [name], MD  CC:  Recheck..  History of Present Illness: Pt in for recheck.  She has a hx of breast cancer and gets yearly mammograms. This is reviewed: No specific mammographic evidence of malignancy.  Next screening  mammogram is recommended in one year. She was followed by Dr. Barton Dubois and has not seen him since 10 years ago. She was dicharged and states was told to just get mammograms and labs yearly.   She needs recheck on her DM, HTN and Hyperlipidemia.  She does FSBS every morning and sometimes during day. She states BS doing well at 120 this am. She states lowest has been upper 50s and highest was 200. She denies symproms of hyperlgycmeia and hypoglycemia. She denies foot problems. She sees Dr. Berline Lopes of Podiatry and just had visit. States all well.  She is due for eye exam in April. She is watching diet. She is walking on treadmill 10 minutes daily.   She ahs hx of tremor. Saw Dr. Fuller Plan and told no Parkinsonism. States told likley benign and advised rehceck this month. She states tremor much better and she is not going to follow-up at this time. Aware of risk.  She now presents.  Hypertension History:      She denies headache, chest pain, palpitations, dyspnea with exertion, orthopnea, PND, peripheral edema, visual symptoms, neurologic problems, syncope, and side effects from treatment.        Positive major cardiovascular risk factors include female age 12 years old or older, diabetes, hyperlipidemia,  hypertension, and current tobacco user.  Negative major cardiovascular risk factors include negative family history for ischemic heart disease.        Further assessment for target organ damage reveals no history of ASHD, stroke/TIA, or peripheral vascular disease.    Lipid Management History:      Positive NCEP/ATP III risk factors include female age 71 years old or older, diabetes, current tobacco user, and hypertension.  Negative NCEP/ATP III risk factors include no history of early menopause without estrogen hormone replacement, no family history for ischemic heart disease, no ASHD (atherosclerotic heart disease), no prior stroke/TIA, no peripheral vascular disease, and no history of aortic aneurysm.        The patient states that she does not know about the "Therapeutic Lifestyle Change" diet.  Her compliance with the TLC diet is fair.  The patient expresses understanding of adjunctive measures for cholesterol lowering.  Adjunctive measures started by the patient include aerobic exercise, fiber, and ASA.  She expresses no side effects from her lipid-lowering medication.  The patient denies any symptoms to suggest myopathy or liver disease.  Comments: No side-effects on Rx.    Preventive Screening-Counseling & Management     Alcohol drinks/day: 0     Smoking Status: current     Smoking Cessation Counseling: yes     Smoke Cessation Stage:  contemplative     Packs/Day: 0.5     Year Started: Age 16s  Problems Prior to Update: 1)  Need Prophylactic Vaccination&inoculation Flu  (ICD-V04.81) 2)  Hip Pain, Left  (ICD-719.45) 3)  Tremor, Left Hand  (ICD-781.0) 4)  Scar Condition and Fibrosis of Skin  (ICD-709.2) 5)  Upper Gastrointestinal Hemorrhage  (ICD-578.9) 6)  Tobacco Abuse  (ICD-305.1) 7)  Peripheal Artery Disease  () 8)  Parotid Tumor/ R  () 9)  Neoplasm, Malignant, Breast, Hx of  (ICD-V10.3) 10)  Diabetes Mellitus, Type II, Controlled  (ICD-250.00) 11)  Osteopenia  (ICD-733.90) 12)   Hypertension  (ICD-401.9) 13)  Hyperlipidemia  (ICD-272.4) 14)  Depression  (ICD-311) 15)  Anxiety  (ICD-300.00)  Current Medications (verified): 1)  Alprazolam 0.25 Mg Tabs (Alprazolam) .... Two Times A Day As Needed 2)  Indapamide 1.25 Mg Tabs (Indapamide) .... Once Daily 3)  Lotrel 5-10 Mg Caps (Amlodipine Besy-Benazepril Hcl) .... Once Daily 4)  Tylenol Extra Strength 500 Mg Tabs (Acetaminophen) .... As Needed 5)  Aspir-Low 81 Mg Tbec (Aspirin) .... On Hold 6)  Caltrate 600+d Plus 600-400 Mg-Unit  Tabs (Calcium Carbonate-Vit D-Min) .... Two Times A Day 7)  Glyburide-Metformin 5-500 Mg Tabs (Glyburide-Metformin) .... 2 in Am and 2 At Night 8)  Nexium 40 Mg Cpdr (Esomeprazole Magnesium) .... One Daily 9)  Lipitor 10 Mg  Tabs (Atorvastatin Calcium) .... One Daily 10)  Accu-Chek Multiclix Lancets   Misc (Lancets) .... Use As Directed 11)  Dm Shoes .... Dx: Niddm With Foot Callous 12)  Left Breast Prosthesis .... Dx. Hx of of Breast Cancer and Last Prosthesis Fitted 10 Years 78)  Diasense Multivitamin  Tabs (Multiple Vitamins-Minerals) .... One Daily  Allergies (verified): 1)  ! Pcn  Past History:  Past Medical History:    Current Problems:     UPPER GASTROINTESTINAL HEMORRHAGE (ICD-578.9)    MALAISE AND FATIGUE (ICD-780.79)    TOBACCO ABUSE (ICD-305.1)    * PERIPHEAL ARTERY DISEASE    * PAROTID TUMOR/ R    NEOPLASM, MALIGNANT, BREAST, HX OF (ICD-V10.3)    DIABETES MELLITUS, TYPE II, UNCONTROLLED (ICD-250.02)    OSTEOPENIA (ICD-733.90)    HYPERTENSION (ICD-401.9)    HYPERLIPIDEMIA (ICD-272.4)    DEPRESSION (ICD-311)    ANXIETY (ICD-300.00)     (01/24/2008)  Past Surgical History:    L mastectomy    TAH - precancerous lesion cervix or uterus - pt not sure.    Upper GI - Early 2008  - gastropathy    Lap Choly July 15, 2007 - Dr. 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    Retired    Divorced    Current Smoker    Alcohol use-no    Drug use-no     (01/11/2007)  Risk Factors:    Alcohol Use: N/A    >5 drinks/d w/in last 3 months: N/A    Caffeine Use: N/A    Diet: N/A    Exercise: no (05/07/2008)  Risk Factors:    Smoking Status: current (02/05/2009)    Packs/Day: 0.5 (02/05/2009)  Cigars/wk: N/A    Pipe Use/wk: N/A    Cans of tobacco/wk: N/A    Passive Smoke Exposure: N/A  Family History:    Father: Dead 43 - brain cancer    Mother: Dead 49 - CHF    Siblings: Sister 76 - Kidney cancer    Children - 4 total - 1 boy and 3 girls - son age 8 DM, HTN and hx of menigitis, daughters are 46, 73, 74 - healthy  Social History:    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 Resp:  Denies cough, shortness of breath, sputum productive, and wheezing. GI:  Denies abdominal pain, constipation, diarrhea, nausea, and vomiting. GU:  Denies nocturia, urinary frequency, and urinary hesitancy.  Physical Exam  General:  Well-developed,well-nourished,in no acute distress; alert,appropriate and cooperative throughout examination Lungs:  Normal respiratory effort, chest expands symmetrically. Lungs are clear to auscultation, no crackles or wheezes. Heart:  Normal rate and regular rhythm. S1 and S2 normal without gallop, murmur, click, rub or other extra sounds. Abdomen:  Bowel sounds positive,abdomen soft and non-tender without masses, organomegaly or hernias noted. Extremities:  No clubbing, cyanosis, edema, or deformity noted with normal  full range of motion of all joints.   Cervical Nodes:  No lymphadenopathy noted Psych:  Cognition and judgment appear intact. Alert and cooperative with normal attention span and concentration. No apparent delusions, illusions, hallucinations   Impression & Recommendations:  Problem # 1:  DIABETES MELLITUS, TYPE II, CONTROLLED (ICD-250.00) Obtain labs via [facility]. Foot care per Podiatry. See eye MD in April. Recheck 3 months. Will call with results. Agrees. Her updated medication list for this problem includes:    Lotrel 5-10 Mg Caps (Amlodipine besy-benazepril hcl) ..... Once daily    Aspir-low 81 Mg Tbec (Aspirin) ..... On hold    Glyburide-metformin 5-500 Mg Tabs (Glyburide-metformin) .Marland Kitchen... 2 in am and 2 at night  Orders: T-Comprehensive Metabolic Panel ([id number]) T-Urine Microalbumin w/creat. ratio ([id number] / [id number]) T- Hemoglobin A1C ([id number])  Problem # 2:  HYPERTENSION (ICD-401.9) Rx as below. BP to goal. Limit salt and exersize.  Her updated medication list for this problem includes:    Indapamide 1.25 Mg Tabs (Indapamide) ..... Once daily    Lotrel 5-10 Mg Caps (Amlodipine besy-benazepril hcl) ..... Once daily  Orders: T-Comprehensive Metabolic Panel ([id number])  Problem # 3:  HYPERLIPIDEMIA (ICD-272.4) Repeat labs and optomize per ATP III. TLC diet and exersize advised. Her updated medication list for this problem includes:    Lipitor 10 Mg Tabs (Atorvastatin calcium) ..... One daily  Orders: T-Comprehensive Metabolic Panel ([id number]) T-Lipid Profile [id number]) T-TSH [id number]  Problem # 4:  TOBACCO ABUSE (ICD-305.1) Councelled cessation. Decines Rx. Follow. Aware of cancer and death risk.  Problem # 5:  Preventive Health Care (ICD-V70.0) Declines pap and breat eval at present.Wants to defer to fall when due tfor colonoscopy. Aware of risk. Follow.  Complete Medication List: 1)  Alprazolam 0.25 Mg Tabs (Alprazolam) .... Two times a day as  needed 2)  Indapamide 1.25 Mg Tabs (Indapamide) .... Once daily 3)  Lotrel 5-10 Mg Caps (Amlodipine besy-benazepril hcl) .... Once daily 4)  Tylenol Extra Strength 500 Mg Tabs (Acetaminophen) .... As needed 5)  Aspir-low 81 Mg Tbec (Aspirin) .... On hold 6)  Caltrate 600+d Plus 600-400 Mg-unit Tabs (Calcium carbonate-vit d-min) .... Two times a day 7)  Glyburide-metformin 5-500 Mg Tabs (Glyburide-metformin) .... 2 in am and 2 at night 8)  Nexium 40 Mg Cpdr (Esomeprazole magnesium) .... One daily 9)  Lipitor 10 Mg Tabs (Atorvastatin calcium) .... One daily 10)  Accu-chek Multiclix Lancets Misc (Lancets) .... Use as directed 11)  Dm Shoes  .... Dx: niddm with foot callous 12)  Left Breast Prosthesis  .... Dx. hx of of breast cancer and last prosthesis fitted 10 years 13)  Diasense Multivitamin Tabs (Multiple vitamins-minerals) .... One daily  Other Orders: T-CBC w/Diff ([id number])  Hypertension Assessment/Plan:      The patient's hypertensive risk group is category C: Target organ damage and/or diabetes.  Her calculated 10 year risk of coronary heart disease is 20 %.  Today's blood pressure is 130/64.  Her blood pressure goal is < 130/80.  Lipid Assessment/Plan:      Based on NCEP/ATP III, the patient's risk factor category is "history of diabetes".  From this information, the patient's calculated lipid goals are as follows: Total cholesterol goal is 200; LDL cholesterol goal is 100; HDL cholesterol goal is 40; Triglyceride goal is 150.    Patient Instructions: 1)  Please schedule a follow-up appointment in 3 months.   Preventive Care Screening  Mammogram:    Next Due:  01/2010  Pap Smear:    Date:  01/17/2008    Next Due:  01/2009    Results:  TAH for ? cancerous lesions - normal     Last LDL:                                                 78 (05/01/2008 7:49:00 PM)      Appended Document: FOLLOW UP 3 MONTH/SLJ Note from Dr. Brandon Melnick called and requested records, she is  sending now I spoke with Jeannene Patella

## 2010-12-16 NOTE — Miscellaneous (Signed)
Summary: Orders Update  Clinical Lists Changes  Orders: Added new Service order of UA Dipstick w/o Micro [phone]) - Signed Observations: Added new observation of KETONES URN: negative (10/21/2007 13:52) Added new observation of BLOOD UR DIP: trace-intact (10/21/2007 13:52) Added new observation of PROTEIN, URN: negative (10/21/2007 13:52) Added new observation of NITRITE URN: negative (10/21/2007 13:52) Added new observation of WBC DIPSTK U: trace (10/21/2007 13:52) Added new observation of UROBILINOGEN: 0.2  (10/21/2007 13:52) Added new observation of Roy URINE: 7.0  (10/21/2007 13:52) Added new observation of SPEC GR URIN: 1.015  (10/21/2007 13:52) Added new observation of APPEARANCE U: Clear  (10/21/2007 13:52) Added new observation of BILIRUBIN UR: negative  (10/21/2007 13:52) Added new observation of GLUCOSE, URN: negative  (10/21/2007 13:52) Added new observation of UA COLOR: yellow  (10/21/2007 13:52)      Laboratory Results   Urine Tests    Routine Urinalysis   Color: yellow Appearance: Clear Glucose: negative   (Normal Range: Negative) Bilirubin: negative   (Normal Range: Negative) Ketone: negative   (Normal Range: Negative) Spec. Gravity: 1.015   (Normal Range: 1.003-1.035) Blood: trace-intact   (Normal Range: Negative) pH: 7.0   (Normal Range: 5.0-8.0) Protein: negative   (Normal Range: Negative) Urobilinogen: 0.2   (Normal Range: 0-1) Nitrite: negative   (Normal Range: Negative) Leukocyte Esterace: trace   (Normal Range: Negative)

## 2010-12-16 NOTE — Medication Information (Signed)
Summary: Visual merchandiser   Imported By: Durwin Reges 04/12/2007 16:25:13  _____________________________________________________________________  External Attachment:    Type:   Image     Comment:   External Document

## 2010-12-16 NOTE — Medication Information (Signed)
Summary: RX Folder  RX Folder   Imported By: Durwin Reges 02/21/2008 15:49:16  _____________________________________________________________________  External Attachment:    Type:   Image     Comment:   External Document
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
## 2010-12-16 NOTE — Letter (Signed)
Summary: Historic Correspondence  Historic Correspondence   Imported By: Otila Kluver, LPN 22/17/9810 25:48:62  _____________________________________________________________________  External Attachment:    Type:   Image     Comment:   External Document

## 2010-12-16 NOTE — Progress Notes (Signed)
Summary: 01/18/08 lab results  Phone Note Outgoing Call   Call placed by: Druscilla Brownie,  January 19, 2008 9:29 AM Summary of Call: Results given to patient, voices understanding   Initial call taken by: Druscilla Brownie,  January 19, 2008 9:29 AM

## 2010-12-16 NOTE — Letter (Signed)
Summary: DIABETIC SUPPLY ORDERS  DIABETIC SUPPLY ORDERS   Imported By: Georgina Peer 06/20/2009 13:38:22  _____________________________________________________________________  External Attachment:    Type:   Image     Comment:   External Document

## 2010-12-16 NOTE — Assessment & Plan Note (Signed)
Summary: fasting labs/slj   Vital Signs:  Patient Profile:   77 Years Old Female Height:     64 inches Weight:      136 pounds BMI:     23.43 O2 Sat:      98 % Pulse rate:   87 / minute Resp:     12 per minute BP sitting:   136 / 69  Vitals Entered By: Deidre Ala (November 06, 2008 8:43 AM)                 PCP:  [name], MD  Chief Complaint:  Lab follow up.  History of Present Illness: Pt in for recheck.  She notes she had Dexa and is on for discussion of result. She has a hx of osteopenia and states takes Calcium and VIT for total of 1200 mg of calcium and 800 Units of Vit E. She does walk for exersize and states has not done much since cold weather. She has begun walking on treadmill and does 5 to 10 minutes daily. She states she does this 4 to 5 times. She denies falls. She does have some lose rugs and has not tripped. She has not had any bone fractures and then adds she did break her left middle finger at work once (age 38)  and she also broke two little toes on right foot after bumping them. They have healed well and she notes has done well since.   She had labs after last visit and this is reviewed:  1. BS - random BS 189. She has known DM and denies any polyuria, polydipisa and polyphagia. Alk Phos was was midly high and consistant with osteopenia.  She now presents.  Lipid Management History:      Positive NCEP/ATP III risk factors include female age 75 years old or older, diabetes, current tobacco user, and hypertension.  Negative NCEP/ATP III risk factors include no history of early menopause without estrogen hormone replacement, no family history for ischemic heart disease, no ASHD (atherosclerotic heart disease), no prior stroke/TIA, no peripheral vascular disease, and no history of aortic aneurysm.        The patient states that she knows about the "Therapeutic Lifestyle Change" diet.  Her compliance with the TLC diet is fair.       Prior Medications  Reviewed Using: Patient Recall  Updated Prior Medication List: ALPRAZOLAM 0.25 MG TABS (ALPRAZOLAM) two times a day as needed INDAPAMIDE 1.25 MG TABS (INDAPAMIDE) once daily LOTREL 5-10 MG CAPS (AMLODIPINE BESY-BENAZEPRIL HCL) Once daily TYLENOL EXTRA STRENGTH 500 MG TABS (ACETAMINOPHEN) as needed ASPIR-LOW 81 MG TBEC (ASPIRIN) On hold CALTRATE 600+D PLUS 600-400 MG-UNIT  TABS (CALCIUM CARBONATE-VIT D-MIN) two times a day GLYBURIDE-METFORMIN 5-500 MG TABS (GLYBURIDE-METFORMIN) 2 in am and 2 at night [BMN] NEXIUM 40 MG CPDR (ESOMEPRAZOLE MAGNESIUM) One daily LIPITOR 10 MG  TABS (ATORVASTATIN CALCIUM) One daily ACCU-CHEK MULTICLIX LANCETS   MISC (LANCETS) use as directed * DM SHOES DX: NIDDM with foot callous * LEFT BREAST PROSTHESIS DX. Hx of of breast cancer and last prosthesis fitted 10 years DIASENSE MULTIVITAMIN  TABS (MULTIPLE VITAMINS-MINERALS) One daily  Current Allergies (reviewed today): ! PCN    Risk Factors: Tobacco use:  current    Year started:  1980    Cigarettes:  Yes -- 1/2 pack(s) per day Drug use:  no Alcohol use:  no Exercise:  no  Family History Risk Factors:    Family History of MI in females < 34 years  old:  no    Family History of MI in males < 78 years old:  no  Colonoscopy History:    Date of Last Colonoscopy:  09/30/2004  Mammogram History:    Date of Last Mammogram:  01/17/2008  PAP Smear History:    Date of Last PAP Smear:  06/01/2006   Review of Systems      See HPI   Physical Exam  General:     Well-developed,well-nourished,in no acute distress; alert,appropriate and cooperative throughout examination Lungs:     Normal respiratory effort, chest expands symmetrically. Lungs are clear to auscultation, no crackles or wheezes. Heart:     Normal rate and regular rhythm. S1 and S2 normal without gallop, murmur, click, rub or other extra sounds.    Impression & Recommendations:  Problem # 1:  OSTEOPENIA (ICD-733.90) Discussed. Rx as  below. Encouraged fall precautions, daily exersize, smoking cessation and will recheck Dexa in 2 years - Sooner if needed. Agrees.  Her updated medication list for this problem includes:    Caltrate 600+d Plus 600-400 Mg-unit Tabs (Calcium carbonate-vit d-min) .Marland Kitchen..Marland Kitchen Two times a day   Problem # 2:  TOBACCO ABUSE (ICD-305.1) Councelled cessation and educated how this plays into thinning of bones. Advised stress coping skills and encouraged to really consider quitting. She will do this.  Problem # 3:  DIABETES MELLITUS, TYPE II, CONTROLLED (ICD-250.00) Stable. Recheck 3 months. Her updated medication list for this problem includes:    Lotrel 5-10 Mg Caps (Amlodipine besy-benazepril hcl) ..... Once daily    Aspir-low 81 Mg Tbec (Aspirin) ..... On hold    Glyburide-metformin 5-500 Mg Tabs (Glyburide-metformin) .Marland Kitchen... 2 in am and 2 at night   Complete Medication List: 1)  Alprazolam 0.25 Mg Tabs (Alprazolam) .... Two times a day as needed 2)  Indapamide 1.25 Mg Tabs (Indapamide) .... Once daily 3)  Lotrel 5-10 Mg Caps (Amlodipine besy-benazepril hcl) .... Once daily 4)  Tylenol Extra Strength 500 Mg Tabs (Acetaminophen) .... As needed 5)  Aspir-low 81 Mg Tbec (Aspirin) .... On hold 6)  Caltrate 600+d Plus 600-400 Mg-unit Tabs (Calcium carbonate-vit d-min) .... Two times a day 7)  Glyburide-metformin 5-500 Mg Tabs (Glyburide-metformin) .... 2 in am and 2 at night 8)  Nexium 40 Mg Cpdr (Esomeprazole magnesium) .... One daily 9)  Lipitor 10 Mg Tabs (Atorvastatin calcium) .... One daily 10)  Accu-chek Multiclix Lancets Misc (Lancets) .... Use as directed 11)  Dm Shoes  .... Dx: niddm with foot callous 12)  Left Breast Prosthesis  .... Dx. hx of of breast cancer and last prosthesis fitted 10 years 13)  Diasense Multivitamin Tabs (Multiple vitamins-minerals) .... One daily  Lipid Assessment/Plan:      Based on NCEP/ATP III, the patient's risk factor category is "history of diabetes".  From this  information, the patient's calculated lipid goals are as follows: Total cholesterol goal is 200; LDL cholesterol goal is 100; HDL cholesterol goal is 40; Triglyceride goal is 150.     Patient Instructions: 1)  Please schedule a follow-up appointment in 3 months.   ]  Preventive Care Screening  Bone Density:    Date:  10/29/2008    Next Due:  10/2010    Results:  Oesteopenia - T-score -1.8 left femur neck std dev

## 2010-12-16 NOTE — Letter (Signed)
Summary: Historic H&P  Historic H&P   Imported By: Otila Kluver, LPN 98/12/2177 81:02:54  _____________________________________________________________________  External Attachment:    Type:   Image     Comment:   External Document

## 2010-12-16 NOTE — Letter (Signed)
Summary: Historic Demographics  Historic Demographics   Imported By: Otila Kluver, LPN 57/84/6962 95:28:41  _____________________________________________________________________  External Attachment:    Type:   Image     Comment:   External Document

## 2010-12-16 NOTE — Assessment & Plan Note (Signed)
Summary: FOLLOW UP/ARC   Vital Signs:  Patient Profile:   77 Years Old Female Height:     64 inches Weight:      136 pounds BMI:     23.43 O2 Sat:      98 % Temp:     97.1 degrees F Pulse rate:   79 / minute Resp:     12 per minute BP sitting:   109 / 66  Vitals Entered By: Deidre Ala (March 05, 2008 9:42 AM)                 PCP:  [name], MD  Chief Complaint:  folow up visit.  History of Present Illness: Pt in for recheck.   She had her yearly physical last visit and this is reviewed. She had vaginal discharge and was placed on Flagyl after dx of BV. She denies dicharge and drainage and notes no pelvic pain. She is happy with progress.   Now presents.  Diabetes Management History:      The patient is a 77 years old female who comes in for evaluation of DM Type 2.  She is (or has been) enrolled in the "Diabetic Education Program".  She is checking home blood sugars.  She says that she is exercising.    Hypertension History:      She denies headache, chest pain, palpitations, dyspnea with exertion, orthopnea, PND, peripheral edema, visual symptoms, neurologic problems, syncope, and side effects from treatment.  She notes no problems with any antihypertensive medication side effects.  Further comments include: No side-effects on BP meds. [employer]. She is walking on treadmill - 10 minutes daily. Marland Kitchen        Positive major cardiovascular risk factors include female age 70 years old or older, diabetes, hyperlipidemia, hypertension, and current tobacco user.  Negative major cardiovascular risk factors include negative family history for ischemic heart disease.        Further assessment for target organ damage reveals no history of ASHD, stroke/TIA, or peripheral vascular disease.    Lipid Management History:      Positive NCEP/ATP III risk factors include female age 73 years old or older, diabetes, current tobacco user, and hypertension.  Negative NCEP/ATP III risk  factors include no history of early menopause without estrogen hormone replacement, no family history for ischemic heart disease, no ASHD (atherosclerotic heart disease), no prior stroke/TIA, no peripheral vascular disease, and no history of aortic aneurysm.        The patient states that she knows about the "Therapeutic Lifestyle Change" diet.  Her compliance with the TLC diet is fair.  The patient expresses understanding of adjunctive measures for cholesterol lowering.  Adjunctive measures started by the patient include aerobic exercise, fiber, ASA, and omega-3 supplements.  She expresses no side effects from her lipid-lowering medication.  The patient denies any symptoms to suggest myopathy or liver disease from her "statin" therapy.  Comments: Se is taking Lipitor at night and states has a little lowe abdominal pain. States used to takein am and had no trouble. Told by pharmacy to switch to night. Now pain. She describes pain as an ache - rates as 5/10. States localized and does not radiate. Zcurious if she can take in am. .       Prior Medications Reviewed Using: Patient Recall  Updated Prior Medication List: ALPRAZOLAM 0.25 MG TABS (ALPRAZOLAM) two times a day INDAPAMIDE 1.25 MG TABS (INDAPAMIDE) once daily LOTREL 5-10 MG CAPS (AMLODIPINE BESY-BENAZEPRIL HCL)  Once daily TYLENOL EXTRA STRENGTH 500 MG TABS (ACETAMINOPHEN) as needed ASPIR-LOW 81 MG TBEC (ASPIRIN) On hold CALTRATE 600+D PLUS 600-400 MG-UNIT  TABS (CALCIUM CARBONATE-VIT D-MIN) two times a day GLYBURIDE-METFORMIN 5-500 MG TABS (GLYBURIDE-METFORMIN) 2 in am and 2 at night [BMN] NEXIUM 40 MG CPDR (ESOMEPRAZOLE MAGNESIUM) One daily LIPITOR 10 MG  TABS (ATORVASTATIN CALCIUM) One daily ACCU-CHEK MULTICLIX LANCETS   MISC (LANCETS) use as directed  Current Allergies (reviewed today): ! PCN  Past Medical History:    Reviewed history from 01/24/2008 and no changes required:       Current Problems:        UPPER GASTROINTESTINAL  HEMORRHAGE (ICD-578.9)       MALAISE AND FATIGUE (ICD-780.79)       TOBACCO ABUSE (ICD-305.1)       * PERIPHEAL ARTERY DISEASE       * PAROTID TUMOR/ R       NEOPLASM, MALIGNANT, BREAST, HX OF (ICD-V10.3)       DIABETES MELLITUS, TYPE II, UNCONTROLLED (ICD-250.02)       OSTEOPENIA (ICD-733.90)       HYPERTENSION (ICD-401.9)       HYPERLIPIDEMIA (ICD-272.4)       DEPRESSION (ICD-311)       ANXIETY (ICD-300.00)         Past Surgical History:    Reviewed history from 01/24/2008 and no changes required:       L mastectomy       TAH - precancerous lesion cervix or uterus - pt not sure.       Upper GI - Early 2008  - gastropathy       Lap Choly July 15, 2007 - Dr. 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       Retired       Divorced       Current Smoker       Alcohol use-no       Drug use-no   Risk Factors:  Tobacco use:  current    Year started:  1980    Counseled to quit/cut down tobacco use:  yes Drug use:  no Alcohol use:  no Exercise:  yes  Family History Risk Factors:    Family History of MI in females < 43 years old:  no    Family History of MI in males < 23 years old:  no  Colonoscopy History:    Date of Last Colonoscopy:  09/30/2004  Mammogram History:    Date of Last Mammogram:  01/17/2008  PAP Smear History:    Date of Last PAP Smear:  06/01/2006   Review of Systems      See HPI   Physical Exam  General:     Well-developed,well-nourished,in no acute distress; alert,appropriate and cooperative throughout examination Lungs:     Normal respiratory effort, chest expands symmetrically. Lungs are clear to auscultation, no crackles or  wheezes. Heart:     Normal rate and regular rhythm. S1 and S2 normal without gallop, murmur, click, rub or other extra sounds. Abdomen:  Bowel sounds positive,abdomen soft and non-tender without masses, organomegaly or hernias noted. Old surgical scar noted from lap choly - mildly tender epigastrium. Extremities:     No clubbing, cyanosis, edema, or deformity noted with normal full range of motion of all joints.   Cervical Nodes:     No lymphadenopathy noted Psych:     Cognition and judgment appear intact. Alert and cooperative with normal attention span and concentration. No apparent delusions, illusions, hallucinations    Impression & Recommendations:  Problem # 1:  VAGINAL DISCHARGE (ICD-623.5) Resolved. Councelled about BV and reassured.  Problem # 2:  TOBACCO ABUSE (ICD-305.1) Councelled. States will try patches. Recheck 3 months. Aware of cancer and death risk.  Problem # 3:  HYPERTENSION (ICD-401.9) Stable. Meds as below. Renal fucntion and lyte check prior to return in 3 months. Her updated medication list for this problem includes:    Indapamide 1.25 Mg Tabs (Indapamide) ..... Once daily    Lotrel 5-10 Mg Caps (Amlodipine besy-benazepril hcl) ..... Once daily  Orders: T-Comprehensive Metabolic Panel ([id number])   Problem # 4:  HYPERLIPIDEMIA (ICD-272.4) Abd pain when taking Lipitor in pm. Switch back to am and if pain persist update. Consider CT if does. Advised TLC with diet, exersize and low fat intake. Check lipids prior to return as well with LFTs. Her updated medication list for this problem includes:    Lipitor 10 Mg Tabs (Atorvastatin calcium) ..... One daily  Orders: T-Comprehensive Metabolic Panel ([id number]) T-Lipid Profile [phone])   Problem # 5:  SCAR CONDITION AND FIBROSIS OF SKIN (ICD-709.2) Vit E oil to lap choly scar two times a day. Recheck 3 months - sooner if concern.  Complete Medication List: 1)  Alprazolam 0.25 Mg Tabs  (Alprazolam) .... Two times a day 2)  Indapamide 1.25 Mg Tabs (Indapamide) .... Once daily 3)  Lotrel 5-10 Mg Caps (Amlodipine besy-benazepril hcl) .... Once daily 4)  Tylenol Extra Strength 500 Mg Tabs (Acetaminophen) .... As needed 5)  Aspir-low 81 Mg Tbec (Aspirin) .... On hold 6)  Caltrate 600+d Plus 600-400 Mg-unit Tabs (Calcium carbonate-vit d-min) .... Two times a day 7)  Glyburide-metformin 5-500 Mg Tabs (Glyburide-metformin) .... 2 in am and 2 at night 8)  Nexium 40 Mg Cpdr (Esomeprazole magnesium) .... One daily 9)  Lipitor 10 Mg Tabs (Atorvastatin calcium) .... One daily 10)  Accu-chek Multiclix Lancets Misc (Lancets) .... Use as directed  Diabetes Management Assessment/Plan:      The following lipid goals have been established for the patient: Total cholesterol goal of 200; LDL cholesterol goal of 100; HDL cholesterol goal of 40; Triglyceride goal of 150.  Her blood pressure goal is < 130/80.    Hypertension Assessment/Plan:      The patient's hypertensive risk group is category C: Target organ damage and/or diabetes.  Her calculated 10 year risk of coronary heart disease is 11 %.  Today's blood pressure is 109/66.  Her blood pressure goal is < 130/80.  Lipid Assessment/Plan:      Based on NCEP/ATP III, the patient's risk factor category is "history of diabetes".  From this information, the patient's calculated lipid goals are as follows: Total cholesterol goal is 200; LDL cholesterol goal is 100; HDL cholesterol goal is 40; Triglyceride goal is 150.     Patient Instructions: 1)  Please schedule a follow-up appointment in 2 months.    ]

## 2010-12-16 NOTE — Letter (Signed)
Summary: [facility], notes  [facility], notes   Imported By: Eliezer Mccoy 08/23/2008 09:17:23  _____________________________________________________________________  External Attachment:    Type:   Image     Comment:   External Document

## 2010-12-16 NOTE — Letter (Signed)
Summary: Historic Flowsheet  Historic Flowsheet   Imported By: Otila Kluver, LPN 06/38/6854 88:30:14  _____________________________________________________________________  External Attachment:    Type:   Image     Comment:   External Document

## 2010-12-16 NOTE — Progress Notes (Signed)
Summary: 01/25/08 lab results  Phone Note Outgoing Call   Call placed by: Druscilla Brownie,  January 27, 2008 11:12 AM Summary of Call: Results given to patient, voices understanding, medication called to [facility] Initial call taken by: Druscilla Brownie,  January 27, 2008 11:12 AM

## 2010-12-16 NOTE — Assessment & Plan Note (Signed)
Summary: FOLLOW UP 3 MONTH/SLJ   Vital Signs:  Patient profile:   77 year old female Height:      64 inches Weight:      137 pounds BMI:     23.60 O2 Sat:      97 % Pulse rate:   82 / minute Resp:     14 per minute BP sitting:   130 / 30  Vitals Entered By: Deidre Ala LPN (August 07, 3243 9:29 AM) CC: DM follow up, Hypertension Management, Lipid Management   Primary Provider:  [name], MD  CC:  DM follow up, Hypertension Management, and Lipid Management.  History of Present Illness: Pt in for recheck.  She will see Dr. Legrand Rams for future care and states has to make appt.   She has DM and needs recheck. She does FSBS twice a day and states numbers doing good - high this morning at 130 and states mostly between 90 and 120. She does get hypoglycemia symptoms if she skips meals or does not eat regular portion. She states gets this 3 to 4 times a month. She denies hyperglycemia. She is taking meds daily. She denies side-effects on Rx. She tries to eat healthy. SHe is not exersizing and states not sure why not. She will get back into it.   She now presents.  Hypertension History:      She denies headache, chest pain, palpitations, dyspnea with exertion, orthopnea, PND, peripheral edema, visual symptoms, neurologic problems, syncope, and side effects from treatment.  She notes no problems with any antihypertensive medication side effects.  Further comments include: She is watching salt. Marland Kitchen        Positive major cardiovascular risk factors include female age 61 years old or older, diabetes, hyperlipidemia, hypertension, and current tobacco user.  Negative major cardiovascular risk factors include negative family history for ischemic heart disease.        Further assessment for target organ damage reveals no history of ASHD, stroke/TIA, or peripheral vascular disease.    Lipid Management History:      Positive NCEP/ATP III risk factors include female age 52 years old or older,  diabetes, current tobacco user, and hypertension.  Negative NCEP/ATP III risk factors include no history of early menopause without estrogen hormone replacement, no family history for ischemic heart disease, no ASHD (atherosclerotic heart disease), no prior stroke/TIA, no peripheral vascular disease, and no history of aortic aneurysm.        The patient states that she knows about the "Therapeutic Lifestyle Change" diet.  Her compliance with the TLC diet is fair.  The patient expresses understanding of adjunctive measures for cholesterol lowering.  Adjunctive measures started by the patient include aerobic exercise and fiber.  She expresses no side effects from her lipid-lowering medication.  The patient denies any symptoms to suggest myopathy or liver disease.  Comments: See HPI. She denies side-effects on Rx.    Current Medications (verified): 1)  Alprazolam 0.25 Mg Tabs (Alprazolam) .... Two Times A Day As Needed 2)  Indapamide 1.25 Mg Tabs (Indapamide) .... Once Daily 3)  Lotrel 5-10 Mg Caps (Amlodipine Besy-Benazepril Hcl) .... Once Daily 4)  Tylenol Extra Strength 500 Mg Tabs (Acetaminophen) .... As Needed 5)  Aspir-Low 81 Mg Tbec (Aspirin) .... On Hold 6)  Caltrate 600+d Plus 600-400 Mg-Unit  Tabs (Calcium Carbonate-Vit D-Min) .... Two Times A Day 7)  Glyburide-Metformin 5-500 Mg Tabs (Glyburide-Metformin) .... 2 in Am and 2 At Night 8)  Nexium 40 Mg  Cpdr (Esomeprazole Magnesium) .... One Daily 9)  Lipitor 10 Mg  Tabs (Atorvastatin Calcium) .... One Daily 10)  Accu-Chek Multiclix Lancets   Misc (Lancets) .... Use As Directed 11)  Dm Shoes .... Dx: Niddm With Foot Callous 12)  Left Breast Prosthesis .... Dx. Hx of of Breast Cancer and Last Prosthesis Fitted 10 Years 47)  Diasense Multivitamin  Tabs (Multiple Vitamins-Minerals) .... One Daily 14)  Januvia 100 Mg Tabs (Sitagliptin Phosphate) .... Take 1 Tablet By Mouth Once A Day  Allergies (verified): 1)  ! Pcn  Past History:  Past  Medical History: Last updated: 01/24/2008 Current Problems:  UPPER GASTROINTESTINAL HEMORRHAGE (ICD-578.9) MALAISE AND FATIGUE (ICD-780.79) TOBACCO ABUSE (ICD-305.1) * PERIPHEAL ARTERY DISEASE * PAROTID TUMOR/ R NEOPLASM, MALIGNANT, BREAST, HX OF (ICD-V10.3) DIABETES MELLITUS, TYPE II, UNCONTROLLED (ICD-250.02) OSTEOPENIA (ICD-733.90) HYPERTENSION (ICD-401.9) HYPERLIPIDEMIA (ICD-272.4) DEPRESSION (ICD-311) ANXIETY (ICD-300.00)  Past Surgical History: Last updated: May 28, 2009 1. L mastectomy related to breast cancer 2. TAH - precancerous lesion cervix or uterus - pt not sure. 3. Upper GI - Early 2008  - gastropathy 4. Lap Choly July 15, 2007 - Dr. 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 Started age 32.  Alcohol use-no Drug use-no Lives with son Education: 12 th grade  Risk Factors: Alcohol Use: 0 (02/05/2009) Exercise: no (05/28/08)  Risk Factors: Smoking Status: current (02/05/2009) Packs/Day: 0.5 (02/05/2009)  Review of Systems      See HPI General:  Denies chills, fever, and sweats. Resp:  Denies cough, shortness of breath, sputum productive, and wheezing. GI:  Denies abdominal pain, constipation, diarrhea, nausea, and vomiting. GU:  Denies nocturia, urinary frequency, and urinary hesitancy. Psych:  Denies anxiety and depression.  Physical Exam  General:  Well-developed,well-nourished,in no acute distress; alert,appropriate and cooperative throughout examination Lungs:   CTA Heart:  RRR Abdomen:  Soft, NT, BS + Extremities:  No redness or swelling. Cervical Nodes:  No lymphadenopathy noted Psych:  Cognition and judgment appear intact. Alert and cooperative with normal attention span and concentration. No apparent delusions, illusions, hallucinations   Impression & Recommendations:  Problem # 1:  DIABETES MELLITUS, TYPE II, UNCONTROLLED (ICD-250.02) A1C up from 7 to 7.3. She states not ready for more medication and feels can do better with TLC diet and exersize. Councelled importance of latter and states if not better in 3 months will increase Rx. Councelled foot and eye care. She sees podiatry for former and has appt set later this month. Check renal fucntion and lytes. Her updated medication list for this problem includes:    Lotrel 5-10 Mg Caps (Amlodipine besy-benazepril hcl) ..... Once daily    Aspir-low 81 Mg Tbec (Aspirin) ..... On hold    Glyburide-metformin 5-500 Mg Tabs (Glyburide-metformin) .Marland Kitchen... 2 in am and 2 at night    Januvia 100 Mg Tabs (Sitagliptin phosphate) .Marland Kitchen... Take 1 tablet by mouth once a day  Orders: T-Comprehensive Metabolic Panel ([id number]) Hemoglobin A1C (83036) Capillary Blood Glucose/CBG (74081)  Problem #  2:  HYPERTENSION (ICD-401.9) Stable. Rx as below. Limit salt. Resume exersize. Her updated medication list for this problem includes:    Indapamide 1.25 Mg Tabs (Indapamide) ..... Once daily    Lotrel 5-10 Mg Caps (Amlodipine besy-benazepril hcl) ..... Once daily  Orders: T-Comprehensive Metabolic Panel ([id number])  Problem # 3:  HYPERLIPIDEMIA (ICD-272.4) TLC diet and exersize with Rx as below advised. Check LFTs on high risk med. Her updated medication list for this problem includes:    Lipitor 10 Mg Tabs (Atorvastatin calcium) ..... One daily  Orders: T-Comprehensive Metabolic Panel ([id number])  Problem # 4:  TOBACCO ABUSE (ICD-305.1) Again urged to quit. States slowly cutting back. Aware of health  risk. Cint urging cut back as patient remains pre-contemplative.  Problem # 5:  Preventive Health Care (ICD-V70.0) Reviewed. Well woman due March 2011. Colonoscopy due this Fall. Advised and agreeable to discuss with new PCP as I am relocating. See Dr. Legrand Rams in 2 months - sooner if needed.  Complete Medication List: 1)  Alprazolam 0.25 Mg Tabs (Alprazolam) .... Two times a day as needed 2)  Indapamide 1.25 Mg Tabs (Indapamide) .... Once daily 3)  Lotrel 5-10 Mg Caps (Amlodipine besy-benazepril hcl) .... Once daily 4)  Tylenol Extra Strength 500 Mg Tabs (Acetaminophen) .... As needed 5)  Aspir-low 81 Mg Tbec (Aspirin) .... On hold 6)  Caltrate 600+d Plus 600-400 Mg-unit Tabs (Calcium carbonate-vit d-min) .... Two times a day 7)  Glyburide-metformin 5-500 Mg Tabs (Glyburide-metformin) .... 2 in am and 2 at night 8)  Nexium 40 Mg Cpdr (Esomeprazole magnesium) .... One daily 9)  Lipitor 10 Mg Tabs (Atorvastatin calcium) .... One daily 10)  Accu-chek Multiclix Lancets Misc (Lancets) .... Use as directed 11)  Dm Shoes  .... Dx: niddm with foot callous 12)  Left Breast Prosthesis  .... Dx. hx of of breast cancer and last prosthesis fitted 10 years 13)  Diasense Multivitamin Tabs (Multiple vitamins-minerals) .... One daily 14)  Januvia 100 Mg Tabs (Sitagliptin phosphate) .... Take 1 tablet by mouth once a day  Hypertension Assessment/Plan:      The patient's hypertensive risk group is category C: Target organ damage and/or diabetes.  Her calculated 10 year risk of coronary heart disease is 17 %.  Today's blood pressure is 130/76.  Her blood pressure goal is < 130/80.  Lipid Assessment/Plan:      Based on NCEP/ATP III, the patient's risk factor category is "history of diabetes".  The patient's lipid goals are as follows: Total cholesterol goal is 200; LDL cholesterol goal is 100; HDL cholesterol goal is 40; Triglyceride goal is 150.   Prescriptions: JANUVIA 100 MG TABS (SITAGLIPTIN PHOSPHATE) Take  1 tablet by mouth once a day  #30 x 3   Entered and Authorized by:   [name] MD   Signed by:   [name] MD on 08/06/2009   Method used:   Print then Give to Patient   RxID:   [id number] LIPITOR 10 MG  TABS (ATORVASTATIN CALCIUM) One daily  #30 x 3   Entered and Authorized by:   [name] MD   Signed by:   [name] MD on 08/06/2009   Method used:   Print then Give to Patient   RxID:   [id number] [place] 40 MG CPDR (ESOMEPRAZOLE MAGNESIUM) One daily  #30 x 3   Entered and Authorized by:   [name] MD   Signed by:   [name] MD on 08/06/2009   Method used:  Print then Give to Patient   RxID:   [id number] GLYBURIDE-METFORMIN 5-500 MG TABS (GLYBURIDE-METFORMIN) 2 in am and 2 at night Brand medically necessary #120 Each x 3   Entered and Authorized by:   [name] MD   Signed by:   [name] MD on 08/06/2009   Method used:   Print then Give to Patient   RxID:   [id number] LOTREL 5-10 MG CAPS (AMLODIPINE BESY-BENAZEPRIL HCL) Once daily  #30 x 3   Entered and Authorized by:   [name] MD   Signed by:   [name] MD on 08/06/2009   Method used:   Print then Give to Patient   RxID:   [id number] ALPRAZOLAM 0.25 MG TABS (ALPRAZOLAM) two times a day as needed  #60 x 0   Entered and Authorized by:   [name] MD   Signed by:   [name] MD on 08/06/2009   Method used:   Print then Give to Patient   RxID:   [id number]   Laboratory Results   Blood Tests     Glucose (random): 199 mg/dL   (Normal Range: 70-105) HGBA1C: 7.3%   (Normal Range: Non-Diabetic - 3-6%   Control Diabetic - 6-8%)       Last LDL:                                                 79 (02/14/2009 9:43:00 PM)        Preventive Care Screening  Pap Smear:    Next Due:  01/2010  Last Flu Shot:    Date:  07/22/2009    Results:  [employer]

## 2010-12-16 NOTE — Assessment & Plan Note (Signed)
Summary: ROUTINE OFFICE VISIT   Vital Signs:  Patient Profile:   77 Years Old Female Height:     64 inches Weight:      133 pounds BMI:     22.91 O2 Sat:      97 % Temp:     97 degrees F Pulse rate:   88 / minute Resp:     12 per minute BP sitting:   128 / 65  Vitals Entered By: Deidre Ala (July 20, 2007 11:27 AM)                 PCP:  [name], MD  Chief Complaint:  follow up visit.  History of Present Illness: Pt in for recheck.  She was recently seen by Dr. Grandville Silos. She had an abnormal HIDA that showed decreased gallbladder EF. She had her surgery this past Friday. This was done laproscopically. She is set to see him in three weeks and appt is set 08/03/2007.  She notes she did well with surgery. She is on Oxycodone for pain and thinks it may be too strong. Makes her sleepy. She rates her pain as a 5/10. Her apetite is improving. She has not been nauseated and denies vomitting. She states her bowels are moving fine and she denies bloody tool. She has no drainage or discharge from her wounds. Spot around navel looks different. States nausea has improved after meals and happy about this.  She is accompanied by her daughter and kids are helping care for her.   Now presents.    Diabetes Management History:      The patient is a 77 years old female who comes in for evaluation of DM Type 2.  She is (or has been) enrolled in the "Diabetic Education Program".  She states understanding of dietary principles and is following her diet appropriately.  She is checking home blood sugars.  She says that she is not exercising regularly.        Hypoglycemic symptoms are not occurring.  No hyperglycemic symptoms are reported.  Other comments include: Checks FSBS daily  -140 this am. Has been as high as 160 since surgery.    Hypertension History:      She denies headache, chest pain, palpitations, dyspnea with exertion, orthopnea, PND, peripheral edema, visual symptoms,  neurologic problems, syncope, and side effects from treatment.  She notes no problems with any antihypertensive medication side effects.        Positive major cardiovascular risk factors include female age 47 years old or older, diabetes, hyperlipidemia, hypertension, and current tobacco user.  Negative major cardiovascular risk factors include negative family history for ischemic heart disease.        Further assessment for target organ damage reveals no history of ASHD, stroke/TIA, or peripheral vascular disease.    Lipid Management History:      Positive NCEP/ATP III risk factors include female age 4 years old or older, diabetes, current tobacco user, and hypertension.  Negative NCEP/ATP III risk factors include no history of early menopause without estrogen hormone replacement, no family history for ischemic heart disease, no ASHD (atherosclerotic heart disease), no prior stroke/TIA, no peripheral vascular disease, and no history of aortic aneurysm.        The patient states that she knows about the "Therapeutic Lifestyle Change" diet.  Her compliance with the TLC diet is fair.  The patient does not know about adjunctive measures for cholesterol lowering.  Adjunctive measures started by the patient include  aerobic exercise, fiber, and omega-3 supplements.  She expresses no side effects from her lipid-lowering medication.  The patient denies any symptoms to suggest myopathy or liver disease from her "statin" therapy.  Comments: Holding Aspirin.     Current Allergies (reviewed today): ! PCN  Past Medical History:    Reviewed history from 10/11/2006 and no changes required:       Anxiety       Depression       Hyperlipidemia       Hypertension       Osteopenia  Past Surgical History:    Reviewed history from 06/20/2007 and no changes required:       L mastectomy       TAH       Upper GI - Early 2008  - gastropathy       Lap Choly July 15, 2007 - Dr. 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 Lungs are clear to auscultation, no crackles or wheezes. Heart:     Normal rate and regular rhythm. S1 and S2 normal without gallop, murmur, click, rub or other extra sounds. Abdomen:     Bowel sounds positive,abdomen soft without masses, no organomegaly or hernias noted. Surgical scars noted from Lap choly. Steri strips in placed. No drainage or discharge. Extremities:     No clubbing, cyanosis, edema, or deformity noted with normal full range of motion of all joints.      Impression & Recommendations:  Problem # 1:  DIABETES MELLITUS, TYPE II, CONTROLLED, WITH COMPLICATIONS (ZOX-096.04) Discussed A1c at 7.1. Needs to be less than 7. Advised diet, exersize and med increase. Would like to hold off on latter and see how she does next few months. Councelled eye care, foot exam. Review paperchart near future for last urine Micral. Update if needed. Her updated medication list for this problem includes:    Lotrel 5-10 Mg Caps (Amlodipine besy-benazepril hcl) ..... Once daily    Aspir-low 81 Mg Tbec (Aspirin) ..... On hold    Glyburide-metformin 5-500 Mg Tabs (Glyburide-metformin) .Marland Kitchen... 2 in am and one at night  Orders: Hemoglobin A1C (83036) Capillary Blood Glucose (54098) T-Comprehensive Metabolic Panel  ([id number])   Problem # 2:  HYPERLIPIDEMIA (ICD-272.4) Tolerating low dose statin well. Repeat LFTs and Lipids in 2 to 3 weeks. Optomize per ATP III. Doing slowly given hx of elevated LFTS. Her updated medication list for this problem includes:    Lipitor 10 Mg Tabs (Atorvastatin calcium) ..... One daily  Orders: T-Comprehensive Metabolic Panel ([id number]) T-Lipid Profile [phone])   Problem # 3:  HYPERTENSION (ICD-401.9) Stable. Meds as is. Councelled low salt intake. Her updated medication list for this problem includes:    Indapamide 1.25 Mg Tabs (Indapamide) ..... Once daily    Lotrel 5-10 Mg Caps (Amlodipine besy-benazepril hcl) ..... Once daily   Problem # 4:  RUQ PAIN (ICD-789.01) Resolved post lap choly. Advised follow-up with Dr. Grandville Silos as is and educated on need to take half pill on Oxycode as she has been doing and is tolerating this much better. Monitor wound for infection, discharge and drainage. Update [place] or surgeon immediately if concern.  Complete Medication List: 1)  Alprazolam 0.25 Mg Tabs (Alprazolam) .... Two times a day 2)  Indapamide 1.25 Mg Tabs (Indapamide) .... Once daily 3)  Lotrel 5-10 Mg Caps (Amlodipine besy-benazepril hcl) .... Once daily 4)  Tylenol Extra Strength 500 Mg Tabs (Acetaminophen) .... As needed 5)  Aspir-low 81 Mg Tbec (Aspirin) .... On hold 6)  Oyst-cal D 250-125 Mg-unit Tabs (Calcium carbonate-vitamin d) .... Three times a day 7)  Glyburide-metformin 5-500 Mg Tabs (Glyburide-metformin) .... 2 in am and one at night 8)  Nexium 40 Mg Cpdr (Esomeprazole magnesium) .... One daily 9)  Lipitor 10 Mg Tabs (Atorvastatin calcium) .... One daily  Diabetes Management Assessment/Plan:      The following lipid goals have been established for the patient: Total cholesterol goal of 200; LDL cholesterol goal of 100; HDL cholesterol goal of 40; Triglyceride goal of 150.  Her blood pressure goal is < 130/80.    Hypertension Assessment/Plan:       The patient's hypertensive risk group is category C: Target organ damage and/or diabetes.  Her calculated 10 year risk of coronary heart disease is 24 %.  Today's blood pressure is 128/65.  Her blood pressure goal is < 130/80.  Lipid Assessment/Plan:      Based on NCEP/ATP III, the patient's risk factor category is "history of diabetes".  From this information, the patient's calculated lipid goals are as follows: Total cholesterol goal is 200; LDL cholesterol goal is 100; HDL cholesterol goal is 40; Triglyceride goal is 150.     Patient Instructions: 1)  Please schedule a follow-up appointment in 3 months.     Laboratory Results   Blood Tests     Glucose (random): 226 mg/dL   (Normal Range: 70-105) HGBA1C: 7.1%   (Normal Range: Non-Diabetic - 3-6%   Control Diabetic - 6-8%)         Last LDL:                                                 134 (04/07/2007 6:37:00 PM)

## 2010-12-16 NOTE — Medication Information (Signed)
Summary: [employer]   Imported By: Durwin Reges 02/21/2008 15:34:28  _____________________________________________________________________  External Attachment:    Type:   Image     Comment:   External Document

## 2010-12-16 NOTE — Letter (Signed)
Summary: Diabetic Supplies  Diabetic Shoe Order   Imported By: Durwin Reges 09/20/2007 10:58:43  _____________________________________________________________________  External Attachment:    Type:   Image     Comment:   External Document

## 2010-12-16 NOTE — Letter (Signed)
Summary: med records release form,endo report  med records release form,endo report   Imported By: Durwin Reges 04/08/2007 10:11:25  _____________________________________________________________________  External Attachment:    Type:   Image     Comment:   External Document

## 2010-12-16 NOTE — Assessment & Plan Note (Signed)
Summary: FOLLOW UP 6 WEEK/SLJ   Vital Signs:  Patient Profile:   77 Years Old Female Height:     64 inches Weight:      134 pounds BMI:     23.08 O2 Sat:      98 % Temp:     98.1 degrees F Pulse rate:   80 / minute Resp:     12 per minute BP sitting:   125 / 72  Vitals Entered By: Deidre Ala (July 30, 2008 8:56 AM)                 PCP:  [name], MD  Chief Complaint:  DM follow up.  History of Present Illness: Pt in for recheck.  She notes she is doing well.  She has not had gone for her head CT and states her tremor is better.She states she believes this stems from her nervous. She states shakes only when anxious. States different things make her anxious - familial discord, life stress in general. She is sleeping so-so. She is irritable. Her concentration is good. Energy is low. No suicidal ideations. She is on Alprazolam and notes makes her sleepy. States hard to take during day. She notes helps a lot and resiolves tremor as well. She states was on Prozac in past and used for 8 months and got crazy thoughts. SHe is agreeable with  trial of SSRI. She statates is agreeable with head CT. She has had her tremor for 6 months. She has had trouble with sister for the last 6 + months as well.  She needs recheck on her DM,HTN and Hyperlipidemia.  She now presents.  Diabetes Management History:      The patient is a 77 years old female who comes in for evaluation of DM Type 2.  She is (or has been) enrolled in the "Diabetic Education Program".  She states understanding of dietary principles and is following her diet appropriately.  No sensory loss is reported.  Self foot exams are being performed.  She is checking home blood sugars.  She says that she is not exercising regularly.        Hypoglycemic symptoms are not occurring.  No hyperglycemic symptoms are reported.  Other comments include: Does FSBS in morning - runs in the low 100s. .    Hypertension History:  She denies headache, chest pain, palpitations, dyspnea with exertion, orthopnea, PND, peripheral edema, visual symptoms, neurologic problems, syncope, and side effects from treatment.  She notes no problems with any antihypertensive medication side effects.  Further comments include: Watching salt.        Positive major cardiovascular risk factors include female age 20 years old or older, diabetes, hyperlipidemia, hypertension, and current tobacco user.  Negative major cardiovascular risk factors include negative family history for ischemic heart disease.        Further assessment for target organ damage reveals no history of ASHD, stroke/TIA, or peripheral vascular disease.    Lipid Management History:      Positive NCEP/ATP III risk factors include female age 75 years old or older, diabetes, current tobacco user, and hypertension.  Negative NCEP/ATP III risk factors include no history of early menopause without estrogen hormone replacement, no family history for ischemic heart disease, no ASHD (atherosclerotic heart disease), no prior stroke/TIA, no peripheral vascular disease, and no history of aortic aneurysm.        The patient states that she knows about the "Therapeutic Lifestyle Change" diet.  Her compliance  with the TLC diet is fair.  The patient expresses understanding of adjunctive measures for cholesterol lowering.  Adjunctive measures started by the patient include aerobic exercise, fiber, and ASA.  She expresses no side effects from her lipid-lowering medication.  The patient denies any symptoms to suggest myopathy or liver disease from her "statin" therapy.         Prior Medications Reviewed Using: Patient Recall  Updated Prior Medication List: ALPRAZOLAM 0.25 MG TABS (ALPRAZOLAM) two times a day as needed INDAPAMIDE 1.25 MG TABS (INDAPAMIDE) once daily LOTREL 5-10 MG CAPS (AMLODIPINE BESY-BENAZEPRIL HCL) Once daily TYLENOL EXTRA STRENGTH 500 MG TABS (ACETAMINOPHEN) as  needed ASPIR-LOW 81 MG TBEC (ASPIRIN) On hold CALTRATE 600+D PLUS 600-400 MG-UNIT  TABS (CALCIUM CARBONATE-VIT D-MIN) two times a day GLYBURIDE-METFORMIN 5-500 MG TABS (GLYBURIDE-METFORMIN) 2 in am and 2 at night [BMN] NEXIUM 40 MG CPDR (ESOMEPRAZOLE MAGNESIUM) One daily LIPITOR 10 MG  TABS (ATORVASTATIN CALCIUM) One daily ACCU-CHEK MULTICLIX LANCETS   MISC (LANCETS) use as directed * DM SHOES DX: NIDDM with foot callous * LEFT BREAST PROSTHESIS DX. Hx of of breast cancer and last prosthesis fitted 10 years  Current Allergies: ! PCN    Risk Factors:     Counseled to quit/cut down tobacco use:  yes    Physical Exam  General:     Well-developed,well-nourished,in no acute distress; alert,appropriate and cooperative throughout examination Lungs:     Normal respiratory effort, chest expands symmetrically. Lungs are clear to auscultation, no crackles or wheezes. Heart:     Normal rate and regular rhythm. S1 and S2 normal without gallop, murmur, click, rub or other extra sounds. Abdomen:     Bowel sounds positive,abdomen soft and non-tender without masses, organomegaly or hernias noted. Extremities:     No clubbing, cyanosis, edema, or deformity noted with normal full range of motion of all joints.   Neurologic:     No cranial nerve deficits noted. Station and gait are normal. Plantar reflexes are down-going bilaterally. DTRs are symmetrical throughout. Sensory, motor and coordinative functions appear intact. Mild resting tremor and she states gets worse when nervous. Cervical Nodes:     No lymphadenopathy noted Psych:     Cognition and judgment appear intact. Alert and cooperative with normal attention span and concentration. No apparent delusions, illusions, hallucinations  Diabetes Management Exam:    Foot Exam (with socks and/or shoes not present):       Sensory-Monofilament:          Left foot: normal          Right foot: normal       Sensory-other: She has         Inspection:          Left foot: abnormal             Comments: She has swollen left little toe. States she dropped a heavy ball on foot while cleaning decased mom's house out. States hurts and rates pain as mild at 3/10. Worse with touch and motion. Mild redness now but purple before. Good cap fill. Pain on flexion and extension.          Right foot: normal       Nails:          Left foot: normal          Right foot: normal    Impression & Recommendations:  Problem # 1:  TREMOR, LEFT HAND (ICD-781.0) Refer CT head. Advised improtance of this and councelled  still suspect tremor anxiety related but need to rule out other causes given hx of breast cancer etc. She is agreeable. Await result and if normal, may consider Rx vs Neurology eval. Orders: Radiology Referral (Radiology)   Problem # 2:  TOBACCO ABUSE (ICD-305.1) Discussed. She is still smoking. Aware of need to quit. Not open to Rx. Advised cancer and death risk. She will consider.  Problem # 3:  DIABETES MELLITUS, TYPE II, CONTROLLED (ICD-250.00) A1C to goal. Rx as is. She dropped a heavy ball on foot and I suspect fracture vs contusion. Gave walking boot, advised buddy taping toes. If sx persist or worsen, update. Councelled foot care, diet and exersize. Urine Micral in December. Her updated medication list for this problem includes:    Lotrel 5-10 Mg Caps (Amlodipine besy-benazepril hcl) ..... Once daily    Aspir-low 81 Mg Tbec (Aspirin) ..... On hold    Glyburide-metformin 5-500 Mg Tabs (Glyburide-metformin) .Marland Kitchen... 2 in am and 2 at night   Problem # 4:  HYPERTENSION (ICD-401.9) Stable. Rx as is. Limit salt. Her updated medication list for this problem includes:    Indapamide 1.25 Mg Tabs (Indapamide) ..... Once daily    Lotrel 5-10 Mg Caps (Amlodipine besy-benazepril hcl) ..... Once daily  Orders: Urinalysis-dipstick only (Medicare patient) (74081KG)   Problem # 5:  HYPERLIPIDEMIA (ICD-272.4) Statin as is. LFTs on  return. TLC as can. Her updated medication list for this problem includes:    Lipitor 10 Mg Tabs (Atorvastatin calcium) ..... One daily   Problem # 6:  DEPRESSION (ICD-311) Familial discord and stress associated with mothers death and having to clean out house. Advised stress coping skills.Needs maintenance Rx and agreeable with Zoloft. Advised method of use,risk and benefit and agrees. Recheck 2 weeks - sooner if side-effects. Her updated medication list for this problem includes:    Alprazolam 0.25 Mg Tabs (Alprazolam) .Marland Kitchen..Marland Kitchen Two times a day as needed    Zoloft 50 Mg Tabs (Sertraline hcl) ..... One daily at bedtime   Complete Medication List: 1)  Alprazolam 0.25 Mg Tabs (Alprazolam) .... Two times a day as needed 2)  Indapamide 1.25 Mg Tabs (Indapamide) .... Once daily 3)  Lotrel 5-10 Mg Caps (Amlodipine besy-benazepril hcl) .... Once daily 4)  Tylenol Extra Strength 500 Mg Tabs (Acetaminophen) .... As needed 5)  Aspir-low 81 Mg Tbec (Aspirin) .... On hold 6)  Caltrate 600+d Plus 600-400 Mg-unit Tabs (Calcium carbonate-vit d-min) .... Two times a day 7)  Glyburide-metformin 5-500 Mg Tabs (Glyburide-metformin) .... 2 in am and 2 at night 8)  Nexium 40 Mg Cpdr (Esomeprazole magnesium) .... One daily 9)  Lipitor 10 Mg Tabs (Atorvastatin calcium) .... One daily 10)  Accu-chek Multiclix Lancets Misc (Lancets) .... Use as directed 11)  Dm Shoes  .... Dx: niddm with foot callous 12)  Left Breast Prosthesis  .... Dx. hx of of breast cancer and last prosthesis fitted 10 years 13)  Diasense Multivitamin Tabs (Multiple vitamins-minerals) .... One daily 14)  Zoloft 50 Mg Tabs (Sertraline hcl) .... One daily at bedtime  Other Orders: Capillary Blood Glucose (82948) Hemoglobin A1C (83036) Venipuncture (81856)  Diabetes Management Assessment/Plan:      The following lipid goals have been established for the patient: Total cholesterol goal of 200; LDL cholesterol goal of 100; HDL cholesterol goal  of 40; Triglyceride goal of 150.  Her blood pressure goal is < 130/80.    Hypertension Assessment/Plan:      The patient's hypertensive risk group is category C:  Target organ damage and/or diabetes.  Her calculated 10 year risk of coronary heart disease is 20 %.  Today's blood pressure is 125/72.  Her blood pressure goal is < 130/80.  Lipid Assessment/Plan:      Based on NCEP/ATP III, the patient's risk factor category is "history of diabetes".  From this information, the patient's calculated lipid goals are as follows: Total cholesterol goal is 200; LDL cholesterol goal is 100; HDL cholesterol goal is 40; Triglyceride goal is 150.     Patient Instructions: 1)  Please schedule a follow-up appointment in 2 weeks.   Prescriptions: ZOLOFT 50 MG TABS (SERTRALINE HCL) One daily at bedtime  #30 x 3   Entered and Authorized by:   [name] MD   Signed by:   [name] MD on 07/30/2008   Method used:   Electronically to        [facility] (retail)       [place] [address]       [place], [place]  [address]       Ph: [phone]       Fax: [phone]   RxID:   [id number]  ] Laboratory Results   Blood Tests     Glucose (random): 249 mg/dL   (Normal Range: 70-105) HGBA1C: 6.9%   (Normal Range: Non-Diabetic - 3-6%   Control Diabetic - 6-8%)        Last LDL:                                                 78 (05/01/2008 7:49:00 PM)      Appended Document: FOLLOW UP 6 WEEK/SLJ Pt scheduled for CT Head on 17 Sep 09 at 0845, pt must register at [facility].    Appended Document: FOLLOW UP 6 WEEK/SLJ Pt advised of the above.

## 2010-12-16 NOTE — Letter (Signed)
Summary: [facility]   Imported By: Otila Kluver, LPN 53/29/9242 68:34:19  _____________________________________________________________________  External Attachment:    Type:   Image     Comment:   External Document

## 2010-12-16 NOTE — Letter (Signed)
Summary: [facility]   Imported By: Otila Kluver, LPN 56/25/6389 37:34:28  _____________________________________________________________________  External Attachment:    Type:   Image     Comment:   External Document

## 2010-12-16 NOTE — Assessment & Plan Note (Signed)
Summary: follow up 3 month/slj   Vital Signs:  Patient profile:   77 year old female Height:      64 inches Weight:      134 pounds BMI:     23.08 O2 Sat:      98 % Temp:     97.9 degrees F Pulse rate:   77 / minute Resp:     12 per minute BP sitting:   125 / 9  Vitals Entered By: Deidre Ala LPN (May 07, 3556 32:20 AM) CC: DM follow up, Hypertension Management, Lipid Management   Primary Brevan Luberto:  [name], MD  CC:  DM follow up, Hypertension Management, and Lipid Management.  History of Present Illness: Pt in for recheck.  She notes she is doing well.  She has DM. She has been doing FSBS daily and states runs in the low 70s. She denies symptoms of hypoglycemia and notes has to eat regualrly otherwise will feel a littke off. He denies hyperglycemia and symptoms polyuria, polyhogia and polydipsia. Weight remians stable. She denies foot problems. She denies eye symptoms and had eye exma in April. Stable. Dr. Gershon Crane follows. She is tlerating Rx and denies side-efefcts. She is eating failry healthy. She is not exersizing as she has been moving. States spouse died and was living in apt - now able to go back home. She states did not get along at all. She states doing good with grieving.   She now presents.  Hypertension History:      She denies headache, chest pain, palpitations, dyspnea with exertion, orthopnea, PND, peripheral edema, visual symptoms, neurologic problems, syncope, and side effects from treatment.        Positive major cardiovascular risk factors include female age 33 years old or older, diabetes, hyperlipidemia, hypertension, and current tobacco user.  Negative major cardiovascular risk factors include negative family history for ischemic heart disease.        Further assessment for target organ damage reveals no history of ASHD, stroke/TIA, or peripheral vascular disease.    Lipid Management History:      Positive NCEP/ATP III risk factors include  female age 51 years old or older, diabetes, current tobacco user, and hypertension.  Negative NCEP/ATP III risk factors include no history of early menopause without estrogen hormone replacement, no family history for ischemic heart disease, no ASHD (atherosclerotic heart disease), no prior stroke/TIA, no peripheral vascular disease, and no history of aortic aneurysm.        The patient states that she knows about the "Therapeutic Lifestyle Change" diet.  Her compliance with the TLC diet is fair.  Adjunctive measures started by the patient include aerobic exercise and fiber.  She expresses no side effects from her lipid-lowering medication.  The patient denies any symptoms to suggest myopathy or liver disease.      Current Problems (verified): 1)  Tremor, Left Hand  (ICD-781.0) 2)  Scar Condition and Fibrosis of Skin  (ICD-709.2) 3)  Upper Gastrointestinal Hemorrhage  (ICD-578.9) 4)  Tobacco Abuse  (ICD-305.1) 5)  Peripheal Artery Disease  () 6)  Parotid Tumor/ R  () 7)  Neoplasm, Malignant, Breast, Hx of  (ICD-V10.3) 8)  Diabetes Mellitus, Type II, Controlled  (ICD-250.00) 9)  Osteopenia  (ICD-733.90) 10)  Hypertension  (ICD-401.9) 11)  Hyperlipidemia  (ICD-272.4) 12)  Depression  (ICD-311) 13)  Anxiety  (ICD-300.00)  Current Medications (verified): 1)  Alprazolam 0.25 Mg Tabs (Alprazolam) .... Two Times A Day As Needed 2)  Indapamide 1.25 Mg  Tabs (Indapamide) .... Once Daily 3)  Lotrel 5-10 Mg Caps (Amlodipine Besy-Benazepril Hcl) .... Once Daily 4)  Tylenol Extra Strength 500 Mg Tabs (Acetaminophen) .... As Needed 5)  Aspir-Low 81 Mg Tbec (Aspirin) .... On Hold 6)  Caltrate 600+d Plus 600-400 Mg-Unit  Tabs (Calcium Carbonate-Vit D-Min) .... Two Times A Day 7)  Glyburide-Metformin 5-500 Mg Tabs (Glyburide-Metformin) .... 2 in Am and 2 At Night 8)  Nexium 40 Mg Cpdr (Esomeprazole Magnesium) .... One Daily 9)  Lipitor 10 Mg  Tabs (Atorvastatin Calcium) .... One Daily 10)  Accu-Chek  Multiclix Lancets   Misc (Lancets) .... Use As Directed 11)  Dm Shoes .... Dx: Niddm With Foot Callous 12)  Left Breast Prosthesis .... Dx. Hx of of Breast Cancer and Last Prosthesis Fitted 10 Years 4)  Diasense Multivitamin  Tabs (Multiple Vitamins-Minerals) .... One Daily 14)  Januvia 100 Mg Tabs (Sitagliptin Phosphate) .... Take 1 Tablet By Mouth Once A Day  Allergies (verified): 1)  ! Pcn  Past History:  Past Medical History: Last updated: 01/24/2008 Current Problems:  UPPER GASTROINTESTINAL HEMORRHAGE (ICD-578.9) MALAISE AND FATIGUE (ICD-780.79) TOBACCO ABUSE (ICD-305.1) * PERIPHEAL ARTERY DISEASE * PAROTID TUMOR/ R NEOPLASM, MALIGNANT, BREAST, HX OF (ICD-V10.3) DIABETES MELLITUS, TYPE II, UNCONTROLLED (ICD-250.02) OSTEOPENIA (ICD-733.90) HYPERTENSION (ICD-401.9) HYPERLIPIDEMIA (ICD-272.4) DEPRESSION (ICD-311) ANXIETY (ICD-300.00)  Family History: Last updated: 04-Jun-2009 Father: Dead 51 - brain cancer Mother: Dead 37 - CHF Siblings: Sister 60 - Kidney cancer Children - 4 total - 1 boy and 3 girls - son age 52 DM, HTN and hx of meningitis - disabled at age 51 and hx of menigitis, daughters are 65, 45, 7 - healthy  Social History: Last updated: 2009/06/04 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 Started age 60.  Alcohol use-no Drug use-no Lives with son Education: 12 th grade  Risk Factors: Alcohol Use: 0 (02/05/2009) Exercise: no (06-04-2008)  Risk Factors: Smoking Status: current (02/05/2009) Packs/Day: 0.5 (02/05/2009)  Past Surgical History: 1. L mastectomy related to breast cancer 2. TAH - precancerous lesion cervix or uterus - pt not sure. 3. Upper GI - Early 2008  - gastropathy 4. Lap Choly July 15, 2007 - Dr. 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 Started age 81.  Alcohol use-no Drug use-no Lives with son Education: 12 th grade  Review of Systems General:  Denies chills, fever, and sweats. Resp:  Denies cough, shortness of breath, sputum productive, and wheezing. GI:  Denies abdominal pain, dark tarry stools, diarrhea, nausea, vomiting, and vomiting blood. GU:  Denies nocturia, urinary frequency, and urinary hesitancy. Psych:  Denies anxiety and depression.  Physical Exam  General:  Well-developed,well-nourished,in no acute distress; alert,appropriate and cooperative throughout examination Lungs:  Normal respiratory effort, chest expands symmetrically. Lungs are clear to auscultation, no crackles or wheezes. Heart:  Normal rate and regular rhythm. S1 and S2 normal without gallop,  murmur, click, rub or other extra sounds. Abdomen:  Bowel sounds positive,abdomen soft and non-tender without masses, organomegaly or hernias noted. Extremities:  No clubbing, cyanosis, edema, or deformity noted with normal full range of motion of all joints.   Cervical Nodes:  No lymphadenopathy noted Psych:  Cognition and judgment appear intact. Alert and cooperative with normal attention span and concentration. No apparent delusions, illusions, hallucinations  Diabetes Management Exam:    Foot Exam (with socks and/or shoes not present):       Sensory-Pinprick/Light touch:          Left medial foot (L-4): normal          Left dorsal foot (L-5): normal          Left lateral foot (S-1): normal          Right medial foot (L-4): normal          Right dorsal foot (L-5): normal          Right  lateral foot (S-1): normal       Sensory-Monofilament:          Left foot: normal          Right foot: normal       Sensory-other: See Podiatry every 10 weeks as well.       Inspection:          Left foot: normal          Right foot: normal       Nails:          Left foot: normal          Right foot: normal    Eye Exam:       Eye Exam done elsewhere          Date: 02/14/2009          Results: Pseudophakia          Done by: Dr. Gershon Crane   Impression & Recommendations:  Problem # 1:  DIABETES MELLITUS, TYPE II, CONTROLLED (ICD-250.00) A1C at 7. Discussed further optomizatio including Insulin. She is not open to this and hence will cont Rx as below with TLC diet and exersize. Cont to see Podiatry 10 weekly for footcare and well aware of need for eye exams. Discussed cost issue with Januvia and will aide in cost reduction with sampled. Gave 4 weeks and adised to all if needs more.  Her updated medication list for this problem includes:    Lotrel 5-10 Mg Caps (Amlodipine besy-benazepril hcl) ..... Once daily    Aspir-low 81 Mg Tbec (Aspirin) ..... On hold    Glyburide-metformin 5-500 Mg Tabs (Glyburide-metformin) .Marland Kitchen... 2 in am and 2 at night    Januvia 100 Mg Tabs (Sitagliptin phosphate) .Marland Kitchen... Take 1 tablet by mouth once a day  Orders: Hemoglobin A1C (83036) Venipuncture (76734)  Problem # 2:  HYPERTENSION (ICD-401.9) Excellent control. Rx as is. Limit salt and exersize daily. Her updated medication list for this problem includes:    Indapamide 1.25 Mg Tabs (Indapamide) ..... Once daily    Lotrel 5-10 Mg Caps (Amlodipine besy-benazepril hcl) ..... Once daily  Problem # 3:  HYPERLIPIDEMIA (ICD-272.4) Stable. Rx as below. TLC diet and exersize as is. Her updated medication list for this problem includes:    Lipitor 10 Mg Tabs (Atorvastatin calcium) ..... One daily  Problem # 4:  TOBACCO ABUSE (ICD-305.1) Urged again to quit. Aware of health risk. Offered Rx. Will  consider.  Problem # 5:  Preventive Health Care (ICD-V70.0) Well woman this fall with pap, colonoscopy etc. Agrees.  Complete Medication List: 1)  Alprazolam 0.25 Mg Tabs (Alprazolam) .... Two times a day as needed 2)  Indapamide 1.25 Mg Tabs (Indapamide) .... Once daily 3)  Lotrel 5-10 Mg Caps (Amlodipine besy-benazepril hcl) .... Once daily 4)  Tylenol Extra Strength 500 Mg Tabs (Acetaminophen) .... As needed 5)  Aspir-low 81 Mg Tbec (Aspirin) .... On hold 6)  Caltrate 600+d Plus 600-400 Mg-unit Tabs (Calcium carbonate-vit d-min) .... Two times a day 7)  Glyburide-metformin 5-500 Mg Tabs (Glyburide-metformin) .... 2 in am and 2 at night 8)  Nexium 40 Mg Cpdr (Esomeprazole magnesium) .... One daily 9)  Lipitor 10 Mg Tabs (Atorvastatin calcium) .... One daily 10)  Accu-chek Multiclix Lancets Misc (Lancets) .... Use as directed 11)  Dm Shoes  .... Dx: niddm with foot callous 12)  Left Breast Prosthesis  .... Dx. hx of of breast cancer and last prosthesis fitted 10 years 13)  Diasense Multivitamin Tabs (Multiple vitamins-minerals) .... One daily 14)  Januvia 100 Mg Tabs (Sitagliptin phosphate) .... Take 1 tablet by mouth once a day  Hypertension Assessment/Plan:      The patient's hypertensive risk group is category C: Target organ damage and/or diabetes.  Her calculated 10 year risk of coronary heart disease is 17 %.  Today's blood pressure is 125/69.  Her blood pressure goal is < 130/80.  Lipid Assessment/Plan:      Based on NCEP/ATP III, the patient's risk factor category is "history of diabetes".  The patient's lipid goals are as follows: Total cholesterol goal is 200; LDL cholesterol goal is 100; HDL cholesterol goal is 40; Triglyceride goal is 150.    Patient Instructions: 1)  Please schedule a follow-up appointment in 3 months.  Laboratory Results   Blood Tests     Glucose (random): 152 mg/dL   (Normal Range: 70-105) HGBA1C: 7.0%   (Normal Range: Non-Diabetic - 3-6%    Control Diabetic - 6-8%)

## 2010-12-16 NOTE — Assessment & Plan Note (Signed)
Summary: PHYSICAL/SLJ   Vital Signs:  Patient Profile:   77 Years Old Female Height:     64 inches Weight:      133 pounds BMI:     22.91 O2 Sat:      99 % Temp:     97.3 degrees F Pulse rate:   89 / minute Resp:     14 per minute BP sitting:   132 / 71  Vitals Entered By: Deidre Ala (January 24, 2008 1:45 PM)                 PCP:  [name], MD  Chief Complaint:  needs physical.  History of Present Illness: Pt comes in today for her annual well woman exam.  She has a hx of breast cancer and had mastectomy on left. She saw Dr. Barton Dubois for this for 10 years and was discharged. She just had mammo and this reported as  normal. She denies breast pain, nipple retraction and discharge. She had chemo for her cancer in the past and states did 16 treatments.  She denies family hx of breast cancer. She is smoker and is not ready to quit yet. She is scared of medications and will try on own. She was 13 with first period. Her last was in her 38s or 28s and was told may have precancerous lesions. Not sure cervix or uterus. She is [id number]. She denies family hx of ovarian cervical and uterine cancer. She denies pelvic pain. She states has had vaginal discharge for about a year. States brown and sees few spots in undies - occasionally. She denies urinary sx and notes no burning, stinging or bad smells. She denies hx of STDs. She is not sexually active.  She hd recent labs and this is reviewed:  1. CBC - normal 2. BMP - BS 156 with hx DM.  She now presents.    Prior Medications Reviewed Using: Patient Recall  Updated Prior Medication List: ALPRAZOLAM 0.25 MG TABS (ALPRAZOLAM) two times a day INDAPAMIDE 1.25 MG TABS (INDAPAMIDE) once daily LOTREL 5-10 MG CAPS (AMLODIPINE BESY-BENAZEPRIL HCL) Once daily TYLENOL EXTRA STRENGTH 500 MG TABS (ACETAMINOPHEN) as needed ASPIR-LOW 81 MG TBEC (ASPIRIN) On hold CALTRATE 600+D PLUS 600-400 MG-UNIT  TABS (CALCIUM CARBONATE-VIT D-MIN) two  times a day GLYBURIDE-METFORMIN 5-500 MG TABS (GLYBURIDE-METFORMIN) 2 in am and 2 at night [BMN] NEXIUM 40 MG CPDR (ESOMEPRAZOLE MAGNESIUM) One daily LIPITOR 10 MG  TABS (ATORVASTATIN CALCIUM) One daily  Current Allergies (reviewed today): ! PCN  Past Medical History:    Reviewed history from 10/11/2006 and no changes required:       Current Problems:        UPPER GASTROINTESTINAL HEMORRHAGE (ICD-578.9)       MALAISE AND FATIGUE (ICD-780.79)       TOBACCO ABUSE (ICD-305.1)       * PERIPHEAL ARTERY DISEASE       * PAROTID TUMOR/ R       NEOPLASM, MALIGNANT, BREAST, HX OF (ICD-V10.3)       DIABETES MELLITUS, TYPE II, UNCONTROLLED (ICD-250.02)       OSTEOPENIA (ICD-733.90)       HYPERTENSION (ICD-401.9)       HYPERLIPIDEMIA (ICD-272.4)       DEPRESSION (ICD-311)       ANXIETY (ICD-300.00)         Past Surgical History:    Reviewed history from 07/20/2007 and no changes required:       L mastectomy  TAH - precancerous lesion cervix or uterus - pt not sure.       Upper GI - Early 2008  - gastropathy       Lap Choly July 15, 2007 - Dr. 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 Left normal as well but no breast present due to mastectomy. Well healed scar wiht  no abnormlaity palpated. Lungs:     Normal respiratory effort, chest expands symmetrically. Lungs are clear to auscultation, no crackles or wheezes. Heart:     Normal rate and regular rhythm. S1 and S2 normal without gallop, murmur, click, rub or other extra sounds. Abdomen:     Bowel sounds positive,abdomen soft without masses, no organomegaly or hernias noted. Surgical scars noted from Lap choly. Healed Genitalia:     Normal introitus for age, no external lesions, no vaginal discharge, mucosa atrophy, no vaginal or cervical lesions, no vaginal atrophy, no friaility or hemorrhage, absent uterus, no adnexal masses or tenderness. MIld erythema surrounding urethra. No mass.  Extremities:     No clubbing, cyanosis, edema, or deformity noted with normal full range of motion of all joints.   Cervical Nodes:     No lymphadenopathy noted Psych:     Cognition and judgment appear intact. Alert and cooperative with normal attention span and concentration. No apparent delusions, illusions, hallucinations    Impression & Recommendations:  Problem # 1:  VAGINAL DISCHARGE (ICD-623.5) Small area of redness around urethra. Very atrophic vaginal walls. Await wet prep, thin prep and vaginal culture. Apply [place] to area around Elizabeth. Recheck 6 weeks and if persist, refer GYN.  Problem # 2:  NEOPLASM, MALIGNANT, BREAST, HX OF (ICD-V10.3) Discussed. Normal Breast exam. Mammo up to date. Follow yearly.  Problem # 3:  TOBACCO ABUSE (ICD-305.1) Councelled cessation. Not open to Rx. Councelled risk and benefit. Aware. States will try to cut back on own.  Complete Medication List: 1)  Alprazolam 0.25 Mg Tabs (Alprazolam) .... Two times a day 2)  Indapamide 1.25 Mg Tabs (Indapamide) .... Once daily 3)  Lotrel 5-10 Mg Caps (Amlodipine besy-benazepril hcl) .... Once daily 4)  Tylenol Extra Strength 500 Mg Tabs (Acetaminophen) .... As needed 5)  Aspir-low 81 Mg Tbec (Aspirin) .... On hold 6)  Caltrate 600+d Plus  600-400 Mg-unit Tabs (Calcium carbonate-vit d-min) .... Two times a day 7)  Glyburide-metformin 5-500 Mg Tabs (Glyburide-metformin) .... 2 in am and 2 at night 8)  Nexium 40 Mg Cpdr (Esomeprazole magnesium) .... One daily 9)  Lipitor 10 Mg Tabs (  Atorvastatin calcium) .... One daily   Patient Instructions: 1)  Please schedule a follow-up appointment in 6 weeks.    ]    Last LDL:                                                 64 (08/11/2007 8:37:00 PM)       Appended Document: Orders Update    Clinical Lists Changes  Orders: Added new Test order of T- * Misc. Laboratory test [phone]) - Signed

## 2010-12-16 NOTE — Letter (Signed)
Summary: Historic Progress Note  Historic Progress Note   Imported By: Otila Kluver, LPN 63/81/7711 65:79:03  _____________________________________________________________________  External Attachment:    Type:   Image     Comment:   External Document

## 2010-12-16 NOTE — Letter (Signed)
Summary: Historic Consent  Historic Consent   Imported By: Otila Kluver, LPN 95/07/3266 12:45:80  _____________________________________________________________________  External Attachment:    Type:   Image     Comment:   External Document

## 2010-12-16 NOTE — Assessment & Plan Note (Signed)
Summary: 2 week follow up/arc   Vital Signs:  Patient Profile:   77 Years Old Female Height:     64 inches Weight:      137 pounds BMI:     23.60 O2 Sat:      97 % Temp:     97.8 degrees F Pulse rate:   88 / minute Resp:     16 per minute BP sitting:   126 / 68  Vitals Entered By: Deidre Ala (Apr 04, 2007 11:10 AM)               PCP:  [name], MD  Chief Complaint:  follow up visit.  History of Present Illness: Pt in for recheck.  She was treated for UTI with Bactrim DS and culture showed Corynebacterium. She notes she took all of her abx and adds it made her nauseated. She completed the entire course. Stomach feels good and she denies suprapubic pain. She denies burning of her urine, bad smells, frequency. She goes to bathroom twice a night and notes this is baseline. She has no sx of yeast infection and notes no vaginal discharge.   Her apetite is good. She notes since finshing abx she has done well. No nausea or vomitting now. SHe has no constipation or diarrhea. No bloody stool or melena and she denies hemoptysis . She denies dysphagia. She is not on any Aspirin or NSAIDS due to recent upper GI bleed.   She is back on a regular diet and has no sx. She has lost 4 pounds since last visit and notes she thinks it because she was nauseated on abx.  Now presents.  Diabetes Management History:      The patient is a 77 years old female who comes in for evaluation of DM Type 2.  She is (or has been) enrolled in the "Diabetic Education Program".  She states understanding of dietary principles and is following her diet appropriately.  No sensory loss is reported.  Self foot exams are being performed.  She is checking home blood sugars.  She says that she is not exercising regularly.        Hypoglycemic symptoms are not occurring.  No hyperglycemic symptoms are reported.  Other comments include: Checks FSBS two times a day - 140 this am.        There are no symptoms to  suggest diabetic complications.  Since her last visit, no infections have occurred.  She's had no treatment plan problems.  No changes have been made to her treatment plan since last visit.    Hypertension History:      She denies headache, chest pain, palpitations, dyspnea with exertion, orthopnea, PND, peripheral edema, visual symptoms, neurologic problems, syncope, and side effects from treatment.  She notes no problems with any antihypertensive medication side effects.        Positive major cardiovascular risk factors include female age 89 years old or older, diabetes, hyperlipidemia, hypertension, and current tobacco user.  Negative major cardiovascular risk factors include negative family history for ischemic heart disease.        Further assessment for target organ damage reveals no history of ASHD, stroke/TIA, or peripheral vascular disease.    Lipid Management History:      Positive NCEP/ATP III risk factors include female age 58 years old or older, diabetes, current tobacco user, and hypertension.  Negative NCEP/ATP III risk factors include no history of early menopause without estrogen hormone replacement, no family history for  ischemic heart disease, no ASHD (atherosclerotic heart disease), no prior stroke/TIA, no peripheral vascular disease, and no history of aortic aneurysm.        The patient states that she knows about the "Therapeutic Lifestyle Change" diet.  Her compliance with the TLC diet is fair.  The patient does not know about adjunctive measures for cholesterol lowering.  Adjunctive measures started by the patient include aerobic exercise, fiber, and omega-3 supplements.  Comments: Was on Tricor and Actos before - developed significant increase LFTS.     Current Allergies (reviewed today): ! 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  CV      See HPI  Resp      Denies chest discomfort, shortness of breath, sputum productive, and wheezing.  GI      See HPI  GU      Denies urinary frequency and urinary hesitancy.   Physical Exam  General:     Well-developed,well-nourished,in no acute distress; alert,appropriate and cooperative throughout examination Lungs:     CTA bilaterally Heart:     RRR. No murmur. Abdomen:     Soft, NT, BS +. No obvious masses or thyrmogelay Extremities:     No clubbing, cyanosis, edema, or deformity noted with normal full range of motion of all joints.    Diabetes Management Exam:    Foot Exam (with socks and/or shoes not present):       Sensory-Monofilament:          Left foot: normal          Right foot: normal       Inspection:          Left foot: normal          Right foot: normal       Nails:          Left foot: normal          Right foot: normal    Eye Exam:       Eye Exam done elsewhere          Date: 10/18/2006          Results: normal          Done by: Dr. Gershon Crane    Impression & Recommendations:  Problem # 1:  UPPER GASTROINTESTINAL HEMORRHAGE (ICD-578.9) Discussed. No sx. Repeat CBC and if stable, restart EC aspirin at baby dose. Discussed risk and benefit of med use and outlined importance of close follow-up. Pt agrees. Orders: T-CBC w/Diff  ([id number])   Problem # 2:  UTI (ICD-599.0) Resolved. Push fluids, cranberry juice and practice good perineal hygiene. Monitor for reoccurence.  Problem # 3:  DIABETES MELLITUS, TYPE II, CONTROLLED, WITH COMPLICATIONS (KHT-977.41) A1C at goal. Meds as is with TLC a must. Councelled yearly eye exam and daily foot care. Her updated medication list for this problem includes:    Lotrel 5-10 Mg Caps (Amlodipine besy-benazepril hcl) ..... Once daily    Aspir-low 81 Mg Tbec (Aspirin) ..... On hold    Glyburide-metformin 5-500 Mg Tabs (Glyburide-metformin) .Marland Kitchen... 2 in am and one at night  Orders: Capillary Blood Glucose (82948) Hemoglobin A1C (83036) Venipuncture (42395) T-Lipid Profile ([id number])   Problem # 4:  HYPERTENSION (ICD-401.9) At goal. Check renal function and electrolytes. Low salt intake encouraged. Her updated medication list for this problem includes:    Indapamide 1.25 Mg Tabs (Indapamide) ..... Once daily    Lotrel 5-10 Mg Caps (Amlodipine besy-benazepril hcl) ..... Once daily   Problem # 5:  HYPERLIPIDEMIA (ICD-272.4) Repeat. Note hx of abnormal LFTS on tricor and Actos. Consider restart to optomize lipids per ATP III. Advised need for statin or similar. Pt hates taking meds per her own report. Await labs and optomize. Orders: T-Hepatic Function [phone]) T-Lipid Profile [phone])   Problem # 6:  Preventive Health Care (ICD-V70.0) Get most recent M[facility] result from [facility].   Medications Added to Medication List This Visit: 1)  Aspir-low 81 Mg Tbec (Aspirin) .... On hold  Diabetes Management Assessment/Plan:      The following lipid goals have been established for the patient: Total cholesterol goal of 200; LDL cholesterol goal of 100; HDL cholesterol goal of 40; Triglyceride goal of 150.  Her blood pressure goal is < 130/80.    Hypertension Assessment/Plan:      The patient's hypertensive risk group is category C: Target organ damage and/or  diabetes.  Her calculated 10 year risk of coronary heart disease is 24 %.  Today's blood pressure is 126/68.  Her blood pressure goal is < 130/80.  Lipid Assessment/Plan:      Based on NCEP/ATP III, the patient's risk factor category is "history of diabetes".  From this information, the patient's calculated lipid goals are as follows: Total cholesterol goal is 200; LDL cholesterol goal is 100; HDL cholesterol goal is 40; Triglyceride goal is 150.     Patient Instructions: 1)  Please schedule a follow-up appointment in 1 month.    Laboratory Results   Blood Tests   Date/Time Recieved: Apr 04, 2007 11:16 AM  Date/Time Reported: Apr 04, 2007 11:16 AM   Glucose (random): 205 mg/dL   (Normal Range: 70-105) HGBA1C: 6.4%   (Normal Range: Non-Diabetic - 3-6%   Control Diabetic - 6-8%)        Preventive Care Screening  Colonoscopy:    Next Due:  09/2009  Bone Density:    Date:  07/15/2006    Next Due:  07/2008    Results:  abnormal - osteopenia std dev  Last Pneumovax:    Date:  03/16/2005    Results:  given   Appended Document: 2 week follow up/arc records requested from [place] system 05.22.08..arc

## 2010-12-16 NOTE — Letter (Signed)
Summary: Internal Other [name]  Internal Other [name]   Imported By: Waldon Merl LPN 11/18/7251 66:44:03  _____________________________________________________________________  External Attachment:    Type:   Image     Comment:   External Document

## 2010-12-16 NOTE — Progress Notes (Signed)
Summary: 06/20/07 lab results  Phone Note Outgoing Call   Call placed by: Druscilla Brownie,  June 21, 2007 12:38 PM Summary of Call: Results given to patient, voices understanding  Initial call taken by: Druscilla Brownie,  June 21, 2007 12:38 PM

## 2010-12-16 NOTE — Miscellaneous (Signed)
Summary: Orders Update  Clinical Lists Changes  Orders: Added new Test order of Nuclear Medicine (Nuclear Medicine) - Signed   Appended Document: Orders Update order faxed & patient notified HIDA scan is 05/16/2007 at 0700am.

## 2010-12-16 NOTE — Letter (Signed)
Summary: Historic labs  Historic labs   Imported By: Otila Kluver, LPN 67/61/9509 32:67:12  _____________________________________________________________________  External Attachment:    Type:   Image     Comment:   External Document

## 2010-12-16 NOTE — Progress Notes (Signed)
Summary: Surgical referral  Phone Note Outgoing Call   Call placed by: Druscilla Brownie,  June 20, 2007 4:35 PM Summary of Call: appointment set for 07/06/07 at 1030 at [place]. Patient notified, voices understanding  Initial call taken by: Druscilla Brownie,  June 20, 2007 4:36 PM

## 2010-12-16 NOTE — Miscellaneous (Signed)
  Clinical Lists Changes  Observations: Added new observation of FLU VAX: Historical (10/12/2007 14:03)       Influenza Immunization History:    Influenza # 1:  Historical (10/12/2007)

## 2010-12-16 NOTE — Assessment & Plan Note (Signed)
Summary: [place]   Vital Signs:  Patient Profile:   77 Years Old Female Height:     64 inches Weight:      141 pounds BMI:     24.29 O2 Sat:      99 % Temp:     97.0 degrees F Pulse rate:   70 / minute Resp:     14 per minute BP sitting:   135 / 68  Vitals Entered By: Deidre Ala (Mar 21, 2007 10:17 AM)               PCP:  [name], MD  Chief Complaint:  HOSP. FOLLOW UP .  History of Present Illness: Pt in for hospital follow-up. She was recently admitted in [place], [place] after throwing up blood. She had blood transfusion.  She had an extensive work-up for this including EGD x 2. She was told she had some esophgeal erosions. She was taken off Aspirin related to this and was told to follow-up. She has a fair apetite. She is on a low fiber diet after DC and does not like this. She has had minimal nausea. She denies vomitting. She denies dark and bloody stools. She denies abdominal pain and notes this just started after starting Protonix. Describes as an aching over her entire belly.  Not bad. Had UTI as inpatient and was treated. Denies dysuria, burning and frequency.  She denies fever, chills and sweats.  She has had no chest pain but does have a mild cough. Had aweful cold post DC. Went to Urgent Care. Got cough syrup and anx and is now doing much better.  She notes she is still tired and worn out.   She is doing fine emotionally. Daughter notes she may be a little depressed.  She is worried about her stomach. She is sleeping well at night. She denies irritability. Concentration is good. Energy level is decreased.   Current Allergies (reviewed today): ! PCN  Past Medical History:    Reviewed history from 10/11/2006 and no changes required:       Anxiety       Depression       Hyperlipidemia       Hypertension       Osteopenia  Past Surgical History:    Reviewed history from 10/11/2006 and no changes  required:       L mastectomy       TAH     Review of Systems      See HPI   Physical Exam  General:     Well-developed,well-nourished,in no acute distress; alert,appropriate and cooperative throughout examination Lungs:     Normal respiratory effort, chest expands symmetrically. Lungs are clear to auscultation, no crackles or wheezes. Heart:     Normal rate and regular rhythm. S1 and S2 normal without gallop, murmur, click, rub or other extra sounds. Abdomen:     Bowel sounds positive,abdomen soft and non-tender without masses, organomegaly or hernias noted. Extremities:     No clubbing, cyanosis, edema, or deformity noted with normal full range of motion of all joints.      Impression & Recommendations:  Problem # 1:  UPPER GASTROINTESTINAL HEMORRHAGE (ICD-578.9) Discussed. Obtain full records from [facility] in [place] to review transfusion hx and EGD results. Councelled need to avoid Aspirin until further review complete. Pt has abdominal pain withn recent UTI - advised repeat today and patient unable to voidf. Declines cath. Will send  specimen cup home and await UA. Patient feels side-effect on Protonix. DC and switch to Nexium. Avoid NSAIDs and monitor for re-occurence. HGB normal today but need to see DC value to now if this is slipping or stable. If abd pain persist, need CT and possibly repeat colonoscopy.  Problem # 2:  MALAISE AND FATIGUE (ICD-780.79) Likley post stress and GI bleed. Councelled taking it slow for a few weeks and building energy back. Recheck two weeks and optomize. Consider TSH. Orders: Hgb (65790) Venipuncture (38333)   Problem # 3:  DIABETES MELLITUS, TYPE II, CONTROLLED, WITH COMPLICATIONS (OVA-919.16) Cont current meds and repeat A1C etc as indicated next few visits. Advised foot check and yearly eye exams. Her updated medication list for this problem includes:    Lotrel 5-10 Mg Caps (Amlodipine besy-benazepril hcl) ..... Once daily     Aspir-low 81 Mg Tbec (Aspirin) ..... Qd    Glyburide-metformin 5-500 Mg Tabs (Glyburide-metformin) .Marland Kitchen... 2 in am and one at night   Problem # 4:  UTI (ICD-599.0) See above.  Medications Added to Medication List This Visit: 1)  Nexium 40 Mg Cpdr (Esomeprazole magnesium) .... One daily   Patient Instructions: 1)  Please schedule a follow-up appointment in 2 weeks - sooner if needed.  Laboratory Results   Blood Tests   Date/Time Recieved: Mar 21, 2007 10:30 AM  Date/Time Reported: Mar 21, 2007 10:30 AM    CBC HGB:  12 g/dL   (Normal Range: 13.0-17.0 in Males, 12.0-15.0 in Females)     Appended Document: UA results    Clinical Lists Changes  Orders: Added new Service order of UA Dipstick w/o Micro [phone]) - Signed Observations: Added new observation of COMMENTS: Send UA for Microscopy, C&S if indicated (03/22/2007 11:48) Added new observation of WBC DIPSTK U: moderate (03/22/2007 11:48) Added new observation of NITRITE URN: negative (03/22/2007 11:48) Added new observation of UROBILINOGEN: 0.2  (03/22/2007 11:48) Added new observation of PROTEIN, URN: negative  (03/22/2007 11:48) Added new observation of BLOOD UR DIP: small  (03/22/2007 11:48) Added new observation of KETONES URN: negative  (03/22/2007 11:48) Added new observation of [place] URINE: 7.5  (03/22/2007 11:48) Added new observation of SPEC GR URIN: 1.015  (03/22/2007 11:48) Added new observation of BILIRUBIN UR: negative  (03/22/2007 11:48) Added new observation of APPEARANCE U: Clear  (03/22/2007 11:48) Added new observation of GLUCOSE, URN: negative  (03/22/2007 11:48) Added new observation of UA COLOR: yellow  (03/22/2007 11:48)     Laboratory Results   Urine Tests  Date/Time Recieved: Mar 22, 2007 11:49 AM  Date/Time Reported: Mar 22, 2007 11:49 AM   Routine Urinalysis   Color: yellow Appearance: Clear Glucose: negative   (Normal Range: Negative) Bilirubin: negative   (Normal Range:  Negative) Ketone: negative   (Normal Range: Negative) Spec. Gravity: 1.015   (Normal Range: 1.003-1.035) Blood: small   (Normal Range: Negative) pH: 7.5   (Normal Range: 5.0-8.0) Protein: negative   (Normal Range: Negative) Urobilinogen: 0.2   (Normal Range: 0-1) Nitrite: negative   (Normal Range: Negative) Leukocyte Esterace: moderate   (Normal Range: Negative)    Comments: Send UA for Microscopy, C&S if indicated

## 2010-12-16 NOTE — Assessment & Plan Note (Signed)
Summary: follow up/arc   Vital Signs:  Patient Profile:   77 Years Old Female Height:     64 inches Weight:      141 pounds BMI:     24.29 O2 Sat:      97 % Temp:     97.9 degrees F Pulse rate:   86 / minute Resp:     14 per minute BP sitting:   130 / 67  Pt. in pain?   no                Visit Type:  Follow-up  Chief Complaint:  follow up visit.  Diabetes Management History:      The patient is a 77 years old female who comes in for evaluation of DM Type 2.  She is (or has been) enrolled in the "Diabetic Education Program".  She states understanding of dietary principles and is following her diet appropriately.  No sensory loss is reported.  Self foot exams are being performed.  She is checking home blood sugars.  She says that she is not exercising regularly.        Hypoglycemic symptoms are not occurring.  No hyperglycemic symptoms are reported.  Other comments include: Checks FSBS one times daily - 120 this am.        There are no symptoms to suggest diabetic complications.  Other questions/concerns include: None.  Since her last visit, no infections have occurred.  She's had no treatment plan problems.  No changes have been made to her treatment plan since last visit.    Hypertension History:      She denies headache, chest pain, palpitations, dyspnea with exertion, orthopnea, PND, peripheral edema, visual symptoms, neurologic problems, syncope, and side effects from treatment.  She notes no problems with any antihypertensive medication side effects.        Positive major cardiovascular risk factors include female age 43 years old or older, diabetes, hyperlipidemia, hypertension, and current tobacco user.  Negative major cardiovascular risk factors include negative family history for ischemic heart disease.        Further assessment for target organ damage reveals no history of ASHD, stroke/TIA, or peripheral vascular disease.    Lipid Management History:      Positive  NCEP/ATP III risk factors include female age 35 years old or older, diabetes, current tobacco user, and hypertension.  Negative NCEP/ATP III risk factors include no history of early menopause without estrogen hormone replacement, no family history for ischemic heart disease, no ASHD (atherosclerotic heart disease), no prior stroke/TIA, no peripheral vascular disease, and no history of aortic aneurysm.        The patient states that she does not know about the "Therapeutic Lifestyle Change" diet.  Her compliance with the TLC diet is fair.  The patient does not know about adjunctive measures for cholesterol lowering.  Adjunctive measures started by the patient include ASA.  Comments: LFTs went up on Tricor and Actos. Had work-up for this and no abnormalities found. She denies abdominal pain, nausea and vomitting. No diarrhea or constipation. No blood in stool.     Prior Medications (reviewed today): ALPRAZOLAM 0.25 MG TABS (ALPRAZOLAM) two times a day INDAPAMIDE 1.25 MG TABS (INDAPAMIDE) once daily TYLENOL EXTRA STRENGTH 500 MG TABS (ACETAMINOPHEN) as needed ASPIR-LOW 81 MG TBEC (ASPIRIN) qd OYST-CAL D 250-125 MG-UNIT TABS (CALCIUM CARBONATE-VITAMIN D) three times a day Current Allergies (reviewed today): ! 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 Lungs are clear to auscultation, no crackles or wheezes. Heart:     Normal rate and regular rhythm. S1 and S2 normal without gallop, murmur, click, rub or other extra sounds. Abdomen:     Bowel sounds positive,abdomen soft and non-tender without masses, organomegaly or hernias noted. Extremities:     No clubbing, cyanosis, edema, or deformity noted with normal full range of motion of all joints.    Diabetes Management Exam:    Foot Exam (with socks and/or shoes not present):       Sensory-Monofilament:          Left foot: normal          Right foot: normal       Sensory-other: Sees Dr. Irving Shows for foot care as well.       Inspection:          Left foot: normal          Right foot: normal       Nails:          Left foot: normal          Right foot: normal    Eye Exam:       Eye Exam done elsewhere          Date: 10/12/2006          Results: Cataracts          Done by: Dr. Gershon Crane    Impression & Recommendations:  Problem # 1:  DIABETES MELLITUS, TYPE II, CONTROLLED, WITH COMPLICATIONS (IOM-355.97) Reviewed. A1C at goal. Councelled diet, exersize and low salt intake.Yeary eye exam advised. Stressed need for foot care. The following medications were removed from the medication list:    Metformin Hcl 1000 Mg Tabs (Metformin hcl) .Marland Kitchen..Marland Kitchen Two times a day    Glucotrol Xl 5 Mg Tb24 (Glipizide) .Marland Kitchen... 2 once daily  Her updated medication list for this problem includes:    Lotrel 5-10 Mg Caps (Amlodipine besy-benazepril hcl) ..... Once daily    Aspir-low 81 Mg Tbec (Aspirin) ..... Qd    Glyburide-metformin 5-500 Mg Tabs (Glyburide-metformin) .Marland Kitchen... 2 in am and one at night  Orders: Capillary Blood Glucose (82948) Hemoglobin A1C (83036) Venipuncture (41638) T-Comprehensive Metabolic Panel ([id number]) T-Lipid Profile ([id number])   Problem # 2:  HYPERLIPIDEMIA (ICD-272.4) RecheckCMP and LIpids and  optomize. Avoid Tricor given acute liver dysfucntion on this and Actos recently. Will possibly add statin. Pt adsvised. Aware of risk and benefit. has been on med in past and notes this will be fine if we want to try  it. LDL goal 70, HDL 50. The following medications were removed from the medication list:    Tricor 145 Mg Tabs (Fenofibrate) ..... Once daily   Problem # 3:  HYPERTENSION (ICD-401.9) At goal. No  change. meds as is. Her updated medication list for this problem includes:    Indapamide 1.25 Mg Tabs (Indapamide) ..... Once daily    Lotrel 5-10 Mg Caps (Amlodipine besy-benazepril hcl) ..... Once daily   Problem # 4:  LIVER FUNCTION TESTS, ABNORMAL (ICD-794.8) Repeat to assure normalization post DC meds. If normal start statin. Orders: T-Comprehensive Metabolic Panel ([id number]) T-Lipid Profile [phone])   Medications Added to Medication List This Visit: 1)  Lotrel 5-10 Mg Caps (Amlodipine besy-benazepril hcl) .... Once daily 2)  Glyburide-metformin 5-500 Mg Tabs (Glyburide-metformin) .... 2 in am and one at night  Diabetes Management Assessment/Plan:      The following lipid goals have been established for the patient: Total cholesterol goal of 200; LDL cholesterol goal of 100; HDL cholesterol goal of 40; Triglyceride goal of 150.  Her blood pressure goal is < 130/80.    Hypertension Assessment/Plan:      The patient's hypertensive risk group is category C: Target organ damage and/or diabetes.  Today's blood pressure is 130/67.  Her blood pressure goal is < 130/80.  Lipid Assessment/Plan:      Based on NCEP/ATP III, the patient's risk factor category is "history of diabetes".  From this information, the patient's calculated lipid goals are as follows: Total cholesterol goal is 200; LDL cholesterol goal is 100; HDL cholesterol goal is 40; Triglyceride goal is 150.       Laboratory Results   Blood Tests     Glucose (random): 218 mg/dL   (Normal Range:  70-105) HGBA1C: 6.8%   (Normal Range: Non-Diabetic - 3-6%   Control Diabetic - 6-8%)

## 2010-12-16 NOTE — Letter (Signed)
Summary: Records Release  Records Release   Imported By: Otila Kluver, LPN 86/28/2417 53:01:04  _____________________________________________________________________  External Attachment:    Type:   Image     Comment:   External Document

## 2010-12-16 NOTE — Assessment & Plan Note (Signed)
Summary: follow up 2 week/slj   Vital Signs:  Patient Profile:   77 Years Old Female Height:     64 inches Weight:      137 pounds BMI:     23.60 O2 Sat:      99 % Pulse rate:   77 / minute Resp:     14 per minute BP sitting:   132 / 67  Vitals Entered By: Deidre Ala (August 13, 2008 9:40 AM)                 PCP:  [name], MD  Chief Complaint:  follow up visit.  History of Present Illness: Pt in for recheck on her tremor.  She had a head CT and this was reported.   She states she is happy with report nd notes her tremors only rally bothers her when anxious or agitated. She has depressio/anxiety and this related to familial discord. She states she has broken ties with her sister who caused a lot of stress with her deceased mom's estate. She sleeps good with her Zoloft and has been on this for two weeks. She denies any side-effects. She is lesss irritable. Her concentration is better. Her energy is getting better slowly as well and she states had none before the Zoloft. She denies suicidal ideations and notes good support in kids and neighbors. She states tremor better with Zoloft as well.   She saw Dr. Fuller Plan of neurology and he reassured her about this. Did not advise Rx and according to her reassured no parkinsonism.  She now presents.    Prior Medications Reviewed Using: Patient Recall  Updated Prior Medication List: ALPRAZOLAM 0.25 MG TABS (ALPRAZOLAM) two times a day as needed INDAPAMIDE 1.25 MG TABS (INDAPAMIDE) once daily LOTREL 5-10 MG CAPS (AMLODIPINE BESY-BENAZEPRIL HCL) Once daily TYLENOL EXTRA STRENGTH 500 MG TABS (ACETAMINOPHEN) as needed ASPIR-LOW 81 MG TBEC (ASPIRIN) On hold CALTRATE 600+D PLUS 600-400 MG-UNIT  TABS (CALCIUM CARBONATE-VIT D-MIN) two times a day GLYBURIDE-METFORMIN 5-500 MG TABS (GLYBURIDE-METFORMIN) 2 in am and 2 at night [BMN] NEXIUM 40 MG CPDR (ESOMEPRAZOLE MAGNESIUM) One daily LIPITOR 10 MG  TABS (ATORVASTATIN CALCIUM)  One daily ACCU-CHEK MULTICLIX LANCETS   MISC (LANCETS) use as directed * DM SHOES DX: NIDDM with foot callous * LEFT BREAST PROSTHESIS DX. Hx of of breast cancer and last prosthesis fitted 10 years DIASENSE MULTIVITAMIN  TABS (MULTIPLE VITAMINS-MINERALS) One daily ZOLOFT 50 MG TABS (SERTRALINE HCL) One daily at bedtime  Current Allergies (reviewed today): ! PCN     Review of Systems      See HPI   Physical Exam  General:     Well-developed,well-nourished,in no acute distress; alert,appropriate and cooperative throughout examination Lungs:     Normal respiratory effort, chest expands symmetrically. Lungs are clear to auscultation, no crackles or wheezes. Heart:     Normal rate and regular rhythm. S1 and S2 normal without gallop, murmur, click, rub or other extra sounds. Abdomen:     Bowel sounds positive,abdomen soft and non-tender without masses, organomegaly or hernias noted. Extremities:     No clubbing, cyanosis, edema, or deformity noted with normal full range of motion of all joints.   Neurologic:     No cranial nerve deficits noted. Station and gait are normal. Plantar reflexes are down-going bilaterally. DTRs are symmetrical throughout. Sensory, motor and coordinative functions appear intact. Mild resting tremor and she states gets worse when nervous. Cervical Nodes:     No lymphadenopathy noted Psych:  Cognition and judgment appear intact. Alert and cooperative with normal attention span and concentration. No apparent delusions, illusions, hallucinations    Impression & Recommendations:  Problem # 1:  TREMOR, LEFT HAND (ICD-781.0) Benign. Await neurology notes. Optomize anxiety and depression. Pt agrees.  Problem # 2:  DEPRESSION (ICD-311) Improved. Helped tremor as well. Councelled stress coping skills. Recheck 4 weeks and taper Zoloft as needed. Her updated medication list for this problem includes:    Alprazolam 0.25 Mg Tabs (Alprazolam) .Marland Kitchen..Marland Kitchen Two times a  day as needed    Zoloft 50 Mg Tabs (Sertraline hcl) ..... One daily at bedtime   Problem # 3:  Preventive Health Care (ICD-V70.0) Update flu-shot. Councelled risk and benefit and gave VIS. Update if any side-effects. Agrees.  Complete Medication List: 1)  Alprazolam 0.25 Mg Tabs (Alprazolam) .... Two times a day as needed 2)  Indapamide 1.25 Mg Tabs (Indapamide) .... Once daily 3)  Lotrel 5-10 Mg Caps (Amlodipine besy-benazepril hcl) .... Once daily 4)  Tylenol Extra Strength 500 Mg Tabs (Acetaminophen) .... As needed 5)  Aspir-low 81 Mg Tbec (Aspirin) .... On hold 6)  Caltrate 600+d Plus 600-400 Mg-unit Tabs (Calcium carbonate-vit d-min) .... Two times a day 7)  Glyburide-metformin 5-500 Mg Tabs (Glyburide-metformin) .... 2 in am and 2 at night 8)  Nexium 40 Mg Cpdr (Esomeprazole magnesium) .... One daily 9)  Lipitor 10 Mg Tabs (Atorvastatin calcium) .... One daily 10)  Accu-chek Multiclix Lancets Misc (Lancets) .... Use as directed 11)  Dm Shoes  .... Dx: niddm with foot callous 12)  Left Breast Prosthesis  .... Dx. hx of of breast cancer and last prosthesis fitted 10 years 13)  Diasense Multivitamin Tabs (Multiple vitamins-minerals) .... One daily 14)  Zoloft 50 Mg Tabs (Sertraline hcl) .... One daily at bedtime  Other Orders: Influenza Vaccine MCR ([id number])   Patient Instructions: 1)  Please schedule a follow-up appointment in 1 month.   ]  Influenza Vaccine    Vaccine Type: Fluvax MCR    Site: right deltoid    Mfr: GlaxoSmithKline    Dose: 0.5 ml    Route: IM    Given by: Deidre Ala    Exp. Date: 05/15/2009    Lot #: [id number]    VIS given: 06/09/07 version given August 13, 2008.  Flu Vaccine Consent Questions    Do you have a history of severe allergic reactions to this vaccine? no    Any prior history of allergic reactions to egg and/or gelatin? no    Do you have a sensitivity to the preservative Thimersol? no    Do you have a past history of Guillan-Barre  Syndrome? no    Do you currently have an acute febrile illness? no    Have you ever had a severe reaction to latex? no    Vaccine information given and explained to patient? yes    Are you currently pregnant? no   Laboratory Results   Urine Tests    Routine Urinalysis   Color: yellow Appearance: Clear Glucose: negative   (Normal Range: Negative) Bilirubin: negative   (Normal Range: Negative) Ketone: negative   (Normal Range: Negative) Spec. Gravity: 1.015   (Normal Range: 1.003-1.035) Blood: small   (Normal Range: Negative) pH: 7.0   (Normal Range: 5.0-8.0) Protein: trace   (Normal Range: Negative) Urobilinogen: 0.2   (Normal Range: 0-1) Nitrite: negative   (Normal Range: Negative) Leukocyte Esterace: moderate   (Normal Range: Negative)  Appended Document: follow up 2 week/slj Records Dr. Brandon Melnick of Neurology in [employer] called and they are sending them.

## 2010-12-16 NOTE — Assessment & Plan Note (Signed)
Summary: 6 WEEK FOLLOW UP/ARC   Vital Signs:  Patient Profile:   77 Years Old Female Height:     64 inches Weight:      135 pounds BMI:     23.26 O2 Sat:      96 % Temp:     97.2 degrees F Pulse rate:   77 / minute Resp:     14 per minute BP sitting:   124 / 73  Vitals Entered By: Deidre Ala (June 20, 2007 10:23 AM)               PCP:  [name], MD  Chief Complaint:  follow up visit.  History of Present Illness: Pt in for recheck.  She notes she feels good and has little to complain about.  Her apetite is fair. She notes she just does not eat much. She has persisting RUQ but notes not as bad as before. She had elevation in LFTs related to use of Actos and Tricor. She notes her pain is a mild discomfort and she notes sx worse after meals. More so in am after pills. States she has not had nausea and she denies vomitting. She has not had diarrhea but always has a little constipation. She had a gallbladder US and this was normal. Then referref for HIDA and this shows abnormal EF of 35.5%. Her most recent LFTs were done in June 2008. All were normal. She is scared of surgery and notes she has read about people having diarrhea. She had recent upper GI bleed. This has resolved. She had a gastropathy per DC summary. She is back on EC Aspirin. No trouble with this. She has started Lipitor back  - no trouble. Needs Nexium refilled.   She now presents.      Hypertension History:      She denies headache, chest pain, palpitations, dyspnea with exertion, orthopnea, PND, peripheral edema, visual symptoms, neurologic problems, syncope, and side effects from treatment.  She notes no problems with any antihypertensive medication side effects.  Further comments include: Watching salt intake. Marland Kitchen        Positive major cardiovascular risk factors include female age 67 years old or older, diabetes, hyperlipidemia, hypertension, and current tobacco user.  Negative major cardiovascular  risk factors include negative family history for ischemic heart disease.        Further assessment for target organ damage reveals no history of ASHD, stroke/TIA, or peripheral vascular disease.    Lipid Management History:      Positive NCEP/ATP III risk factors include female age 62 years old or older, diabetes, current tobacco user, and hypertension.  Negative NCEP/ATP III risk factors include no history of early menopause without estrogen hormone replacement, no family history for ischemic heart disease, no ASHD (atherosclerotic heart disease), no prior stroke/TIA, no peripheral vascular disease, and no history of aortic aneurysm.        The patient states that she knows about the "Therapeutic Lifestyle Change" diet.  Her compliance with the TLC diet is fair.  Adjunctive measures started by the patient include aerobic exercise, fiber, ASA, and omega-3 supplements.  She expresses no side effects from her lipid-lowering medication.  The patient denies any symptoms to suggest myopathy or liver disease from her "statin" therapy.      Current Allergies (reviewed today): ! 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 Lungs:     Normal respiratory effort, chest expands symmetrically. Lungs are clear to auscultation, no crackles or wheezes. Heart:     Normal rate and regular rhythm. S1 and S2 normal without gallop, murmur, click, rub or other extra sounds. Abdomen:     Bowel sounds positive,abdomen soft without masses, no organomegaly or hernias noted. She has tenderness over RUQ. Extremities:     No clubbing, cyanosis, edema, or deformity noted with normal full range of motion of all joints.   Psych:     Cognition and judgment appear intact. Alert and cooperative with normal attention span and concentration. No apparent delusions, illusions, hallucinations    Impression & Recommendations:  Problem # 1:  RUQ PAIN (ICD-789.01) HIDA showed decreased EF at 35.5%. Discussed poor gallbladder function and how it relates to sx. Needs surgical eval. She is agreeable after initial hesitation and would like referal to [facility]. Nurse to assist with appt.  Orders: T-Comprehensive Metabolic Panel ([id number])   Problem # 2:  UPPER GASTROINTESTINAL HEMORRHAGE (ICD-578.9) Resolved. Cont Nexium as is. Check CBC as she is back on Baby Aspirin. Monitor closely. Orders: T-CBC w/Diff ([id number])   Problem # 3:  DIABETES MELLITUS, TYPE II, CONTROLLED, WITH COMPLICATIONS (VZC-588.50) Stable. Councelld meds as is with diet, exersize a must. Recheck A1C etc in 4 weeks. Her updated medication list for this problem includes:    Lotrel 5-10 Mg Caps (Amlodipine besy-benazepril hcl) ..... Once daily    Aspir-low 81 Mg Tbec (Aspirin) ..... On hold    Glyburide-metformin 5-500 Mg Tabs (Glyburide-metformin) .Marland Kitchen... 2 in am and one at night   Problem # 4:  HYPERTENSION (ICD-401.9) At goal. Meds as is. Check renal function and lytes. Low salt intake and yearly  eye exam a must. Her updated medication list for this problem includes:    Indapamide 1.25 Mg Tabs (Indapamide) ..... Once daily    Lotrel 5-10 Mg Caps (Amlodipine besy-benazepril hcl) ..... Once daily   Complete Medication List: 1)  Alprazolam 0.25 Mg Tabs (Alprazolam) .... Two times a day 2)  Indapamide 1.25 Mg Tabs (Indapamide) .... Once daily 3)  Lotrel 5-10 Mg Caps (Amlodipine besy-benazepril hcl) .... Once daily  4)  Tylenol Extra Strength 500 Mg Tabs (Acetaminophen) .... As needed 5)  Aspir-low 81 Mg Tbec (Aspirin) .... On hold 6)  Oyst-cal D 250-125 Mg-unit Tabs (Calcium carbonate-vitamin d) .... Three times a day 7)  Glyburide-metformin 5-500 Mg Tabs (Glyburide-metformin) .... 2 in am and one at night 8)  Nexium 40 Mg Cpdr (Esomeprazole magnesium) .... One daily 9)  Lipitor 10 Mg Tabs (Atorvastatin calcium) .... One daily  Hypertension Assessment/Plan:      The patient's hypertensive risk group is category C: Target organ damage and/or diabetes.  Her calculated 10 year risk of coronary heart disease is 24 %.  Today's blood pressure is 124/73.  Her blood pressure goal is < 130/80.  Lipid Assessment/Plan:      Based on NCEP/ATP III, the patient's risk factor category is "history of diabetes".  From this information, the patient's calculated lipid goals are as follows: Total cholesterol goal is 200; LDL cholesterol goal is 100; HDL cholesterol goal is 40; Triglyceride goal is 150.     Patient Instructions: 1)  Please schedule a follow-up appointment in 1 month.    Prescriptions: LIPITOR 10 MG  TABS (ATORVASTATIN CALCIUM) One daily  #30 x 3   Entered and Authorized by:   [name] MD   Signed by:   [name] MD on 06/20/2007   Method used:   Print then Give to Patient   RxID:   [id number] NEXIUM 40 MG CPDR (ESOMEPRAZOLE MAGNESIUM) One daily  #30 x 6   Entered and Authorized by:   [name] MD   Signed by:   [name] MD on  06/20/2007   Method used:   Print then Give to Patient   RxID:   [id number]

## 2010-12-18 NOTE — Assessment & Plan Note (Signed)
Summary: [name]   Visit Type:  Follow-up Primary Provider:  [name], MD  CC:  6 month ROV; C/O fatigue.  History of Present Illness: 77 yo AAF with history of mild CAD, DM, HTN, hyperlipidemia, PAD, tobacco abuse admitted to [facility]  May 13,2011 with a syncopal episode. Her troponin was elevated at 0.96 and her D-dimer was elevated at 0.93.  CT angiogram chest without evidence of PE. EKG with T wave inversions anterior and inferior. Cardiac cath on 03/31/10 with mild disease in the LAD but no obstructive lesions. Echo with normal LV size and function with mild AI. Carotid dopplers normal. Her CT chest did show centrilobular emphysema.  I last saw her in May 2011.   She is here today for cardiac follow up. She has been doing well. She does her own cooking and cleaning. She has some fatigue. No chest pain. No recurrent syncope. She denies palpitations, dizziness, lower ext edema. No claudication. She continues to smoke.   Current Medications (verified): 1)  Alprazolam 0.25 Mg Tabs (Alprazolam) .... Two Times A Day As Needed 2)  Indapamide 1.25 Mg Tabs (Indapamide) .... Once Daily 3)  Lotrel 5-20 Mg Caps (Amlodipine Besy-Benazepril Hcl) .Marland Kitchen.. 1 Cap Once Daily 4)  Tylenol Extra Strength 500 Mg Tabs (Acetaminophen) .... As Needed 5)  Aspirin Ec 325 Mg Tbec (Aspirin) .... Take One Tablet By Mouth Daily 6)  Caltrate 600+d Plus 600-400 Mg-Unit  Tabs (Calcium Carbonate-Vit D-Min) .... Two Times A Day 7)  Glyburide-Metformin 5-500 Mg Tabs (Glyburide-Metformin) .... 2 in Am and 2 At Night 8)  Nexium 40 Mg Cpdr (Esomeprazole Magnesium) .... One Daily 9)  Lipitor 10 Mg  Tabs (Atorvastatin Calcium) .... One Daily 10)  Accu-Chek Multiclix Lancets   Misc (Lancets) .... Use As Directed 11)  Dm Shoes .... Dx: Niddm With Foot Callous 12)  Left Breast Prosthesis .... Dx. Hx of of Breast Cancer and Last Prosthesis Fitted 10 Years 81)  Diasense Multivitamin  Tabs (Multiple Vitamins-Minerals) ....  One Daily 14)  Nicotine 21 Mg/24hr Pt24 (Nicotine) .... Use As Directed 15)  Wellbutrin Sr 150 Mg Xr12h-Tab (Bupropion Hcl) .Marland Kitchen.. 1 Tab Once Daily  Allergies: 1)  ! Pcn  Past History:  Past Medical History: Reviewed history from 04/15/2010 and no changes required. Current Problems:  UPPER GASTROINTESTINAL HEMORRHAGE (ICD-578.9) MALAISE AND FATIGUE (ICD-780.79) TOBACCO ABUSE (ICD-305.1) * PAROTID TUMOR/ R NEOPLASM, MALIGNANT, BREAST, HX OF (ICD-V10.3) DIABETES MELLITUS, TYPE II, UNCONTROLLED (ICD-250.02) OSTEOPENIA (ICD-733.90) HYPERTENSION (ICD-401.9) HYPERLIPIDEMIA (ICD-272.4) DEPRESSION (ICD-311) ANXIETY (ICD-300.00)  Social History: Reviewed history from 04/15/2010 and no changes required. 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 Stopped 03/31/10.  Started age 54.  Alcohol use-no Drug use-no Lives with son Education: 12 th grade  Review of Systems       The patient complains of fatigue.  The patient denies malaise, fever, weight gain/loss, vision loss, decreased hearing, hoarseness, chest pain, palpitations, shortness of breath, prolonged cough, wheezing, sleep apnea, coughing up blood, abdominal pain, blood in stool, nausea, vomiting, diarrhea, heartburn, incontinence, blood in urine, muscle weakness, joint pain, leg swelling, rash, skin lesions, headache, fainting, dizziness, depression, anxiety, enlarged lymph nodes, easy bruising or bleeding, and environmental allergies.    Vital Signs:  Patient profile:   77 year old female Height:      64 inches Weight:  145 pounds BMI:     24.98 Pulse rate:   80 / minute Pulse rhythm:   regular BP sitting:   134 / 60  (right arm) Cuff size:   regular  Vitals Entered By: Eliezer Lofts, EMT-P (October 30, 2010 11:58 AM)  Physical Exam  General:  General: Well developed, well  nourished, NAD HEENT: OP clear, mucus membranes moist SKIN: warm, dry Neuro: No focal deficits Musculoskeletal: Muscle strength 5/5 all ext Psychiatric: Mood and affect normal Neck: No JVD, no carotid bruits, no thyromegaly, no lymphadenopathy. Lungs:Clear bilaterally, no wheezes, rhonci, crackles CV: RRR no murmurs, gallops rubs Abdomen: soft, NT, ND, BS present Extremities: No edema, pulses 1+ bilateral DP/PT    Prior Report Reviewed for Echocardiogram:  Findings: 03/30/2010  Left ventricle: The cavity size was normal. Wall thickness was     increased in a pattern of mild LVH. Systolic function was     hyperdynamic. The estimated ejection fraction was in the range of     70% to 75%. Wall motion was normal; there were no regional wall     motion abnormalities.   - Aortic valve: Mildly calcified annulus. Trileaflet; moderately     calcified leaflets. Mild regurgitation.   - Left atrium: The atrium was mildly dilated.   - Right atrium: The atrium was mildly dilated.   - Atrial septum: No defect or patent foramen ovale was identified.   - Tricuspid valve: Mild diffuse leaflet thickening. Mild-moderate     regurgitation.   - Pulmonary arteries: Systolic pressure was mildly increased. PA     peak pressure: 62m Hg (S).  Comments:    Impression & Recommendations:  Problem # 1:  CORONARY ATHEROSCLEROSIS NATIVE CORONARY ARTERY (ICD-414.01) Stable. Continue current meds inlcuding ASA and statin. Smoking cessation encouraged. Repeat echo in one year to assess aortic insufficiency, tricuspid regurgitation.   Her updated medication list for this problem includes:    Lotrel 5-20 Mg Caps (Amlodipine besy-benazepril hcl) ..Marland Kitchen.. 1 cap once daily    Aspirin Ec 325 Mg Tbec (Aspirin) ..Marland Kitchen.. Take one tablet by mouth daily  Patient Instructions: 1)  Your physician recommends that you schedule a follow-up appointment in: 1year 2)  Your physician recommends that you continue on your current  medications as directed. Please refer to the Current Medication list given to you today. 3)  Your physician has requested that you have an echocardiogram in 1 year. Echocardiography is a painless test that uses sound waves to create images of your heart. It provides your doctor with information about the size and shape of your heart and how well your heart's chambers and valves are working.  This procedure takes approximately one hour. There are no restrictions for this procedure.

## 2011-01-13 ENCOUNTER — Other Ambulatory Visit (HOSPITAL_COMMUNITY): Payer: Self-pay | Admitting: Internal Medicine

## 2011-01-13 DIAGNOSIS — Z139 Encounter for screening, unspecified: Secondary | ICD-10-CM

## 2011-01-15 ENCOUNTER — Other Ambulatory Visit (HOSPITAL_COMMUNITY): Payer: Self-pay | Admitting: Internal Medicine

## 2011-01-15 ENCOUNTER — Ambulatory Visit (HOSPITAL_COMMUNITY)
Admission: RE | Admit: 2011-01-15 | Discharge: 2011-01-15 | Disposition: A | Discharge status: Home / Self Care | Payer: Medicare Other | Source: Ambulatory Visit | Attending: Internal Medicine | Admitting: Internal Medicine

## 2011-01-15 DIAGNOSIS — Z139 Encounter for screening, unspecified: Secondary | ICD-10-CM

## 2011-01-23 ENCOUNTER — Ambulatory Visit (HOSPITAL_COMMUNITY)
Admission: RE | Admit: 2011-01-23 | Discharge: 2011-01-23 | Disposition: A | Discharge status: Home / Self Care | Payer: Medicare Other | Source: Ambulatory Visit | Attending: Internal Medicine | Admitting: Internal Medicine

## 2011-01-23 DIAGNOSIS — Z1231 Encounter for screening mammogram for malignant neoplasm of breast: Secondary | ICD-10-CM | POA: Insufficient documentation

## 2011-01-27 LAB — URINE MICROSCOPIC-ADD ON

## 2011-01-27 LAB — URINALYSIS, ROUTINE W REFLEX MICROSCOPIC
Bilirubin Urine: NEGATIVE
Glucose, UA: >1000 mg/dL — AB
Ketones, ur: NEGATIVE mg/dL
Nitrite: NEGATIVE
Protein, ur: NEGATIVE mg/dL
Specific Gravity, Urine: 1.01 (ref 1.005–1.030)
Urobilinogen, UA: 0.2 mg/dL (ref 0.0–1.0)
pH: 6.5 (ref 5.0–8.0)

## 2011-01-27 LAB — CBC
HCT: 35.7 % — ABNORMAL LOW (ref 36.0–46.0)
Hemoglobin: 12.2 g/dL (ref 12.0–15.0)
MCH: 33.9 pg (ref 26.0–34.0)
MCHC: 34 g/dL (ref 30.0–36.0)
MCV: 99.5 fL (ref 78.0–100.0)
Platelets: 261 10*3/uL (ref 150–400)
RBC: 3.59 MIL/uL — ABNORMAL LOW (ref 3.87–5.11)
RDW: 14.2 % (ref 11.5–15.5)
WBC: 5.5 10*3/uL (ref 4.0–10.5)

## 2011-01-27 LAB — GLUCOSE, CAPILLARY
Glucose-Capillary: 126 mg/dL — ABNORMAL HIGH (ref 70–99)
Glucose-Capillary: 41 mg/dL — CL (ref 70–99)
Glucose-Capillary: 54 mg/dL — ABNORMAL LOW (ref 70–99)
Glucose-Capillary: 58 mg/dL — ABNORMAL LOW (ref 70–99)

## 2011-01-27 LAB — BASIC METABOLIC PANEL
BUN: 9 mg/dL (ref 6–23)
CO2: 26 mEq/L (ref 19–32)
Calcium: 9 mg/dL (ref 8.4–10.5)
Chloride: 93 mEq/L — ABNORMAL LOW (ref 96–112)
Creatinine, Ser: 0.64 mg/dL (ref 0.4–1.2)
GFR calc Af Amer: >60 mL/min (ref >60)
GFR calc non Af Amer: >60 mL/min (ref >60)
Glucose, Bld: 311 mg/dL — ABNORMAL HIGH (ref 70–99)
Potassium: 3.7 mEq/L (ref 3.5–5.1)
Sodium: 129 mEq/L — ABNORMAL LOW (ref 135–145)

## 2011-01-27 LAB — DIFFERENTIAL
Basophils Absolute: 0 10*3/uL (ref 0.0–0.1)
Basophils Relative: 1 % (ref 0–1)
Eosinophils Absolute: 0.1 10*3/uL (ref 0.0–0.7)
Eosinophils Relative: 2 % (ref 0–5)
Lymphocytes Relative: 40 % (ref 12–46)
Lymphs Abs: 2.2 10*3/uL (ref 0.7–4.0)
Monocytes Absolute: 0.2 10*3/uL (ref 0.1–1.0)
Monocytes Relative: 5 % (ref 3–12)
Neutro Abs: 2.9 10*3/uL (ref 1.7–7.7)
Neutrophils Relative %: 53 % (ref 43–77)

## 2011-01-27 LAB — URINE CULTURE
Colony Count: 35000
Culture  Setup Time: 201111030144

## 2011-02-02 LAB — POCT I-STAT 3, VENOUS BLOOD GAS (G3P V)
Bicarbonate: 25.1 mEq/L — ABNORMAL HIGH (ref 20.0–24.0)
O2 Saturation: 64 %
TCO2: 26 mmol/L (ref 0–100)
pCO2, Ven: 42.4 mmHg — ABNORMAL LOW (ref 45.0–50.0)
pH, Ven: 7.38 — ABNORMAL HIGH (ref 7.250–7.300)
pO2, Ven: 34 mmHg (ref 30.0–45.0)

## 2011-02-02 LAB — CBC
HCT: 34.3 % — ABNORMAL LOW (ref 36.0–46.0)
Hemoglobin: 11.9 g/dL — ABNORMAL LOW (ref 12.0–15.0)
MCHC: 34.6 g/dL (ref 30.0–36.0)
MCV: 101.3 fL — ABNORMAL HIGH (ref 78.0–100.0)
Platelets: 215 10*3/uL (ref 150–400)
RBC: 3.39 MIL/uL — ABNORMAL LOW (ref 3.87–5.11)
RDW: 13.9 % (ref 11.5–15.5)
WBC: 6.3 10*3/uL (ref 4.0–10.5)

## 2011-02-02 LAB — GLUCOSE, CAPILLARY
Glucose-Capillary: 127 mg/dL — ABNORMAL HIGH (ref 70–99)
Glucose-Capillary: 150 mg/dL — ABNORMAL HIGH (ref 70–99)
Glucose-Capillary: 156 mg/dL — ABNORMAL HIGH (ref 70–99)
Glucose-Capillary: 157 mg/dL — ABNORMAL HIGH (ref 70–99)
Glucose-Capillary: 159 mg/dL — ABNORMAL HIGH (ref 70–99)
Glucose-Capillary: 180 mg/dL — ABNORMAL HIGH (ref 70–99)
Glucose-Capillary: 180 mg/dL — ABNORMAL HIGH (ref 70–99)
Glucose-Capillary: 190 mg/dL — ABNORMAL HIGH (ref 70–99)
Glucose-Capillary: 199 mg/dL — ABNORMAL HIGH (ref 70–99)
Glucose-Capillary: 202 mg/dL — ABNORMAL HIGH (ref 70–99)
Glucose-Capillary: 260 mg/dL — ABNORMAL HIGH (ref 70–99)
Glucose-Capillary: 282 mg/dL — ABNORMAL HIGH (ref 70–99)
Glucose-Capillary: 63 mg/dL — ABNORMAL LOW (ref 70–99)

## 2011-02-02 LAB — BASIC METABOLIC PANEL
BUN: 6 mg/dL (ref 6–23)
BUN: 7 mg/dL (ref 6–23)
CO2: 28 mEq/L (ref 19–32)
CO2: 28 mEq/L (ref 19–32)
Calcium: 8.3 mg/dL — ABNORMAL LOW (ref 8.4–10.5)
Calcium: 8.7 mg/dL (ref 8.4–10.5)
Chloride: 102 mEq/L (ref 96–112)
Chloride: 99 mEq/L (ref 96–112)
Creatinine, Ser: 0.49 mg/dL (ref 0.4–1.2)
Creatinine, Ser: 0.7 mg/dL (ref 0.4–1.2)
GFR calc Af Amer: >60 mL/min (ref >60)
GFR calc Af Amer: >60 mL/min (ref >60)
GFR calc non Af Amer: >60 mL/min (ref >60)
GFR calc non Af Amer: >60 mL/min (ref >60)
Glucose, Bld: 147 mg/dL — ABNORMAL HIGH (ref 70–99)
Glucose, Bld: 273 mg/dL — ABNORMAL HIGH (ref 70–99)
Potassium: 3.4 mEq/L — ABNORMAL LOW (ref 3.5–5.1)
Potassium: 3.6 mEq/L (ref 3.5–5.1)
Sodium: 132 mEq/L — ABNORMAL LOW (ref 135–145)
Sodium: 136 mEq/L (ref 135–145)

## 2011-02-02 LAB — POCT I-STAT 3, ART BLOOD GAS (G3+)
Acid-Base Excess: 4 mmol/L — ABNORMAL HIGH (ref 0.0–2.0)
Bicarbonate: 28.2 mEq/L — ABNORMAL HIGH (ref 20.0–24.0)
O2 Saturation: 90 %
TCO2: 29 mmol/L (ref 0–100)
pCO2 arterial: 40.5 mmHg (ref 35.0–45.0)
pH, Arterial: 7.451 — ABNORMAL HIGH (ref 7.350–7.400)
pO2, Arterial: 56 mmHg — ABNORMAL LOW (ref 80.0–100.0)

## 2011-02-02 LAB — BRAIN NATRIURETIC PEPTIDE: Pro B Natriuretic peptide (BNP): 359 pg/mL — ABNORMAL HIGH (ref 0.0–100.0)

## 2011-02-03 LAB — URINE MICROSCOPIC-ADD ON

## 2011-02-03 LAB — CARDIAC PANEL(CRET KIN+CKTOT+MB+TROPI)
CK, MB: 1.6 ng/mL (ref 0.3–4.0)
CK, MB: 2.1 ng/mL (ref 0.3–4.0)
CK, MB: 3.2 ng/mL (ref 0.3–4.0)
Relative Index: 1.4 (ref 0.0–2.5)
Relative Index: 1.7 (ref 0.0–2.5)
Relative Index: 2.4 (ref 0.0–2.5)
Total CK: 112 U/L (ref 7–177)
Total CK: 127 U/L (ref 7–177)
Total CK: 133 U/L (ref 7–177)
Troponin I: 0.25 ng/mL — ABNORMAL HIGH (ref 0.00–0.06)
Troponin I: 0.47 ng/mL — ABNORMAL HIGH (ref 0.00–0.06)
Troponin I: 0.67 ng/mL (ref 0.00–0.06)

## 2011-02-03 LAB — CBC
HCT: 36.8 % (ref 36.0–46.0)
Hemoglobin: 13 g/dL (ref 12.0–15.0)
MCHC: 35.2 g/dL (ref 30.0–36.0)
MCV: 98.8 fL (ref 78.0–100.0)
Platelets: 252 10*3/uL (ref 150–400)
RBC: 3.73 MIL/uL — ABNORMAL LOW (ref 3.87–5.11)
RDW: 14 % (ref 11.5–15.5)
WBC: 5.9 10*3/uL (ref 4.0–10.5)

## 2011-02-03 LAB — POCT CARDIAC MARKERS
CKMB, poc: 2.3 ng/mL (ref 1.0–8.0)
Myoglobin, poc: 69.3 ng/mL (ref 12–200)
Troponin i, poc: 0.18 ng/mL — ABNORMAL HIGH (ref 0.00–0.09)

## 2011-02-03 LAB — BASIC METABOLIC PANEL
BUN: 10 mg/dL (ref 6–23)
CO2: 28 mEq/L (ref 19–32)
Calcium: 9.1 mg/dL (ref 8.4–10.5)
Chloride: 96 mEq/L (ref 96–112)
Creatinine, Ser: 0.53 mg/dL (ref 0.4–1.2)
GFR calc Af Amer: >60 mL/min (ref >60)
GFR calc non Af Amer: >60 mL/min (ref >60)
Glucose, Bld: 245 mg/dL — ABNORMAL HIGH (ref 70–99)
Potassium: 3.6 mEq/L (ref 3.5–5.1)
Sodium: 133 mEq/L — ABNORMAL LOW (ref 135–145)

## 2011-02-03 LAB — DIFFERENTIAL
Basophils Absolute: 0 10*3/uL (ref 0.0–0.1)
Basophils Relative: 0 % (ref 0–1)
Eosinophils Absolute: 0 10*3/uL (ref 0.0–0.7)
Eosinophils Relative: 1 % (ref 0–5)
Lymphocytes Relative: 21 % (ref 12–46)
Lymphs Abs: 1.2 10*3/uL (ref 0.7–4.0)
Monocytes Absolute: 0.2 10*3/uL (ref 0.1–1.0)
Monocytes Relative: 4 % (ref 3–12)
Neutro Abs: 4.4 10*3/uL (ref 1.7–7.7)
Neutrophils Relative %: 75 % (ref 43–77)

## 2011-02-03 LAB — CK TOTAL AND CKMB (NOT AT ARMC)
CK, MB: 4.7 ng/mL — ABNORMAL HIGH (ref 0.3–4.0)
Relative Index: 3.6 — ABNORMAL HIGH (ref 0.0–2.5)
Total CK: 132 U/L (ref 7–177)

## 2011-02-03 LAB — HEMOGLOBIN A1C
Hgb A1c MFr Bld: 6.6 % — ABNORMAL HIGH (ref <5.7)
Mean Plasma Glucose: 143 mg/dL — ABNORMAL HIGH (ref <117)

## 2011-02-03 LAB — LIPID PANEL
Cholesterol: 126 mg/dL (ref 0–200)
HDL: 50 mg/dL (ref >39)
LDL Cholesterol: 60 mg/dL (ref 0–99)
Total CHOL/HDL Ratio: 2.5 RATIO
Triglycerides: 78 mg/dL (ref <150)
VLDL: 16 mg/dL (ref 0–40)

## 2011-02-03 LAB — URINALYSIS, ROUTINE W REFLEX MICROSCOPIC
Bilirubin Urine: NEGATIVE
Glucose, UA: 250 mg/dL — AB
Ketones, ur: NEGATIVE mg/dL
Leukocytes, UA: NEGATIVE
Nitrite: NEGATIVE
Protein, ur: NEGATIVE mg/dL
Specific Gravity, Urine: 1.01 (ref 1.005–1.030)
Urobilinogen, UA: 0.2 mg/dL (ref 0.0–1.0)
pH: 6.5 (ref 5.0–8.0)

## 2011-02-03 LAB — GLUCOSE, CAPILLARY
Glucose-Capillary: 111 mg/dL — ABNORMAL HIGH (ref 70–99)
Glucose-Capillary: 113 mg/dL — ABNORMAL HIGH (ref 70–99)
Glucose-Capillary: 122 mg/dL — ABNORMAL HIGH (ref 70–99)
Glucose-Capillary: 187 mg/dL — ABNORMAL HIGH (ref 70–99)
Glucose-Capillary: 72 mg/dL (ref 70–99)
Glucose-Capillary: 77 mg/dL (ref 70–99)

## 2011-02-03 LAB — PROTIME-INR
INR: 0.94 (ref 0.00–1.49)
Prothrombin Time: 12.5 seconds (ref 11.6–15.2)

## 2011-02-03 LAB — D-DIMER, QUANTITATIVE: D-Dimer, Quant: 0.93 ug/mL-FEU — ABNORMAL HIGH (ref 0.00–0.48)

## 2011-02-03 LAB — APTT: aPTT: 28 seconds (ref 24–37)

## 2011-02-03 LAB — TSH: TSH: 1.783 u[IU]/mL (ref 0.350–4.500)

## 2011-02-03 LAB — TROPONIN I: Troponin I: 0.96 ng/mL (ref 0.00–0.06)

## 2011-02-13 ENCOUNTER — Other Ambulatory Visit (HOSPITAL_COMMUNITY): Payer: Self-pay | Admitting: Internal Medicine

## 2011-02-13 DIAGNOSIS — M79605 Pain in left leg: Secondary | ICD-10-CM

## 2011-02-13 DIAGNOSIS — I739 Peripheral vascular disease, unspecified: Secondary | ICD-10-CM

## 2011-02-13 DIAGNOSIS — M79604 Pain in right leg: Secondary | ICD-10-CM

## 2011-02-17 ENCOUNTER — Ambulatory Visit (HOSPITAL_COMMUNITY)
Admission: RE | Admit: 2011-02-17 | Discharge: 2011-02-17 | Disposition: A | Discharge status: Home / Self Care | Payer: Medicare Other | Source: Ambulatory Visit | Attending: Internal Medicine | Admitting: Internal Medicine

## 2011-02-17 DIAGNOSIS — E119 Type 2 diabetes mellitus without complications: Secondary | ICD-10-CM | POA: Insufficient documentation

## 2011-02-17 DIAGNOSIS — I10 Essential (primary) hypertension: Secondary | ICD-10-CM | POA: Insufficient documentation

## 2011-02-17 DIAGNOSIS — M79609 Pain in unspecified limb: Secondary | ICD-10-CM | POA: Insufficient documentation

## 2011-02-17 DIAGNOSIS — I739 Peripheral vascular disease, unspecified: Secondary | ICD-10-CM | POA: Insufficient documentation

## 2011-02-17 DIAGNOSIS — E785 Hyperlipidemia, unspecified: Secondary | ICD-10-CM | POA: Insufficient documentation

## 2011-02-19 LAB — GLUCOSE, CAPILLARY: Glucose-Capillary: 159 mg/dL — ABNORMAL HIGH (ref 70–99)

## 2011-03-31 NOTE — Op Note (Signed)
[name], [name] NO.:  Chambers   MEDICAL RECORD NO.:  [id number]          PATIENT TYPE:  OIB   LOCATION:  5735                         FACILITY:  [facility]   PHYSICIAN:  [name], Michele ChambersDATE OF BIRTH:  11/05/1931   DATE OF PROCEDURE:  07/15/2007  DATE OF DISCHARGE:                               OPERATIVE REPORT   PREOPERATIVE DIAGNOSIS:  Biliary dyskinesia.   POSTOPERATIVE DIAGNOSIS:  Biliary dyskinesia.   PROCEDURE:  Laparoscopic cholecystectomy with intraoperative  cholangiogram.   SURGEON:  Michele Ray. Grandville Silos, MD   ANESTHESIA:  General.   HISTORY OF PRESENT ILLNESS:  [name] is a 77 year old female who I  evaluated in the office for symptomatic biliary dyskinesia, and she  presents today for elective cholecystectomy.   PROCEDURE IN DETAIL:  Informed consent was obtained.  The patient was  identified in the preop holding area.  She received intravenous  antibiotics.  She was brought to the operating room.  General anesthesia  was administered.  Her abdomen was prepped and draped in a sterile  fashion.  The infraumbilical region was infiltrated with 0.25% Marcaine  with epinephrine.  Infraumbilical incision was made.  Subcutaneous  tissues were dissected down revealing anterior fascia.  This was divided  sharply along the midline.  The peritoneal cavity was then entered under  direct vision without difficulty.  A 0 Vicryl pursestring suture was  placed around the fascial opening.  The Hasson trocar was inserted into  the abdomen and the abdomen was insufflated with carbon dioxide in the  standard fashion.  Under direct vision an 11-mm epigastric and two 5-mm  ports were placed, 0.25% Marcaine with epinephrine was placed in all  port sites.  Laparoscopic exploration revealed some filmy omental  adhesions to the gallbladder.  The dome of the gallbladder was retracted  superomedially.  These filmy adhesions were swept down and this revealed  the  infundibulum.  The infundibulum was retracted inferolaterally.  Dissection began laterally and progressed medially, identifying the  cystic duct and the cystic artery.  The cystic artery was prominent on  top of the cystic duct, clearly extending up onto the gallbladder.  This  was dissected completely, clipped twice proximally and once distally and  divided.  The cystic duct was then dissected further until a large  window was created between the cystic duct, the liver and the  infundibulum of the gallbladder.  Once we had excellent visualization, a  clip was placed on the infundibulo-cystic duct junction, a small nick  was made in the cystic duct, and a Reddick cholangiogram catheter was  inserted.  Intraoperative cholangiogram was obtained.  This demonstrated  no common bile duct filling defects, a long length of cystic duct and  good flow of contrast into the duodenum.  Cholangiogram was completed.  The catheter was removed.  Three clips were placed proximally on the  cystic duct and it was divided.  The gallbladder was taken off the liver  bed with the Bovie cautery.  We did encounter a posterior branch of the  cystic artery.  This was clipped  twice proximally and divided distally  with cautery.  The gallbladder was then placed in an EndoCatch bag and  taken out the abdomen via the infraumbilical port site.  The liver bed  was copiously irrigated and meticulous hemostasis was obtained with the  Bovie cautery.  The liver bed was inspected and was dry.  Clips remained  excellent position.  The irrigation fluid was evacuated and it was  clear.  The liver bed was checked one were time and remained dry.  Ports  were then removed under direct vision.  Pneumoperitoneum was released.  The Hasson trocar was removed.  The infraumbilical fascia was closed by  tying the 0 Vicryl pursestring suture with care not to trap any intra-  abdominal contents.  All four wounds were copiously irrigated and  the  skin of each was closed  with a running 4-0 Vicryl subcuticular stitch.  Sponge, needle and  instrument counts were correct.  Benzoin, Steri-Strips and sterile  dressings were applied.  The patient tolerated the procedure well  without apparent complication and was taken to recovery in stable  condition.      [name], M.D.  Electronically Signed     BET/MEDQ  D:  07/15/2007  T:  07/16/2007  Job:  [id number]   cc:   [name], M.D.

## 2011-04-03 NOTE — H&P (Signed)
NAME:  [name], [name]                          ACCOUNT NO.:  [id number]   MEDICAL RECORD NO.:  [id number]                   PATIENT TYPE:  AMB   LOCATION:  DAY                                  FACILITY:  [facility]   PHYSICIAN:  Leane Para C. Tamala Julian, M.D.                DATE OF BIRTH:  1931/02/17   DATE OF ADMISSION:  DATE OF DISCHARGE:                                HISTORY & PHYSICAL   HISTORY OF PRESENT ILLNESS:  [age]-old female with need for  screening colonoscopy.  Last colonoscopy was in 1995.  The findings were  __________ not known.  She has not had change in bowel habits.  No rectal  bleeding.  No family history of colon cancer.   PAST HISTORY:  Past history is positive for:  1. Hypertension.  2. Diabetes.  3. Anxiety disorder.  4. Osteoarthritis.  5. Hyperlipidemia.  6. History of breast cancer.   MEDICATIONS:  1. Lotrel 5/10 mg daily.  2. Lozol 1.25 mg daily.  3. Aspirin 81 mg daily.  4. Anaprox DS 1 b.i.d.  5. Darvocet-N 100 b.i.d.  6. Tricor 45 mg daily.   SURGERY:  1. Modified radical mastectomy.  2. Hemorrhoidectomy.  3. Hysterectomy.   PHYSICAL EXAMINATION:  VITAL SIGNS:  On exam, blood pressure 110/70, pulse  84, respirations 18, weight 143 pounds.  HEENT:  Unremarkable.  NECK:  Neck is supple.  No JVD or bruit.  CHEST:  Chest clear to auscultation.  HEART:  Regular rate and rhythm without murmur, gallop or rub.  CHEST WALL:  Breasts reveal well-healed left mastectomy scar.  Axillae  normal.  ABDOMEN:  Abdomen is soft and nontender.  No masses.  EXTREMITIES:  No cyanosis, clubbing or edema.  NEUROLOGIC:  No focal motor, sensory or cerebellar deficit.   IMPRESSION:  1. Need for screening colonoscopy.  2. Hypertension.  3. Diabetes mellitus.  4. Osteoarthritis.  5. History of breast carcinoma.   PLAN:  Screening colonoscopy.     ___________________________________________                                         Vernon Prey. Tamala Julian, M.D.   LCS/MEDQ  D:  04/16/2004  T:  04/17/2004  Job:  [id number]

## 2011-08-28 LAB — DIFFERENTIAL
Basophils Absolute: 0
Basophils Relative: 1
Eosinophils Absolute: 0.1
Eosinophils Relative: 2
Lymphocytes Relative: 39
Lymphs Abs: 2.5
Monocytes Absolute: 0.2
Monocytes Relative: 4
Neutro Abs: 3.5
Neutrophils Relative %: 55

## 2011-08-28 LAB — CBC
HCT: 38.8
Hemoglobin: 13.4
MCHC: 34.4
MCV: 95.3
Platelets: 295
RBC: 4.07
RDW: 13.9
WBC: 6.4

## 2011-08-28 LAB — BASIC METABOLIC PANEL
BUN: 8
CO2: 29
Calcium: 9.6
Chloride: 97
Creatinine, Ser: 0.49
GFR calc Af Amer: >60
GFR calc non Af Amer: >60
Glucose, Bld: 116 — ABNORMAL HIGH
Potassium: 3.9
Sodium: 135

## 2011-10-20 ENCOUNTER — Encounter: Payer: Self-pay | Admitting: Cardiovascular Disease

## 2011-10-21 ENCOUNTER — Ambulatory Visit (INDEPENDENT_AMBULATORY_CARE_PROVIDER_SITE_OTHER): Payer: Medicare Other | Admitting: Cardiovascular Disease

## 2011-10-21 ENCOUNTER — Encounter: Payer: Self-pay | Admitting: Cardiovascular Disease

## 2011-10-21 VITALS — BP 134/69 | HR 73 | Ht 64.0 in | Wt 144.0 lb

## 2011-10-21 DIAGNOSIS — F17200 Nicotine dependence, unspecified, uncomplicated: Secondary | ICD-10-CM

## 2011-10-21 DIAGNOSIS — R0609 Other forms of dyspnea: Secondary | ICD-10-CM

## 2011-10-21 DIAGNOSIS — Z720 Tobacco use: Secondary | ICD-10-CM

## 2011-10-21 DIAGNOSIS — Z87891 Personal history of nicotine dependence: Secondary | ICD-10-CM | POA: Insufficient documentation

## 2011-10-21 DIAGNOSIS — R0989 Other specified symptoms and signs involving the circulatory and respiratory systems: Secondary | ICD-10-CM

## 2011-10-21 DIAGNOSIS — I2510 Atherosclerotic heart disease of native coronary artery without angina pectoris: Secondary | ICD-10-CM

## 2011-10-21 DIAGNOSIS — R0600 Dyspnea, unspecified: Secondary | ICD-10-CM

## 2011-10-21 NOTE — Assessment & Plan Note (Signed)
She is known to have normal LV systolic function and mild CAD. Her dyspnea is most likely from underlying lung disease with long term tobacco abuse. No prior PFTs or pulmonary evaluation. I will make a referral to [facility]monary for evaluation. She understands that continued tobacco abuse will in all likelihood make her dyspnea worse.

## 2011-10-21 NOTE — Patient Instructions (Signed)
Your physician wants you to follow-up in: 12 months.  You will receive a reminder letter in the mail two months in advance. If you don't receive a letter, please call our office to schedule the follow-up appointment.  You have been referred to [facility] pulmonary--Dr. Chase Caller  Your physician recommends that you continue on your current medications as directed. Please refer to the Current Medication list given to you today.

## 2011-10-21 NOTE — Assessment & Plan Note (Addendum)
Mild CAD by cath May 2011. She is on a statin, ASA and Ace-inhibitor. NO changes in therapy. I have asked her once again to stop smoking as this will accelerate the progression of her CAD. She wishes to stop but does not want any cessation aides at this time.

## 2011-10-21 NOTE — Assessment & Plan Note (Signed)
Cessation recommended.

## 2011-10-21 NOTE — Assessment & Plan Note (Signed)
BP well controlled. No changes.

## 2011-10-21 NOTE — Progress Notes (Signed)
History of Present Illness: 77 yo AAF with history of mild CAD, DM, HTN, hyperlipidemia, PAD, tobacco abuse admitted to [facility] May 13,2011 with a syncopal episode. Her troponin was elevated at 0.96 and her D-dimer was elevated at 0.93. CT angiogram chest without evidence of PE. EKG with T wave inversions anterior and inferior. Cardiac cath on 03/31/10 with mild disease in the LAD but no obstructive lesions. Echo with normal LV size and function with mild AI. Carotid dopplers normal. Her CT chest did show centrilobular emphysema. I last saw her in December 2011.   She is here today for cardiac follow up. She has been doing well. She does her own cooking and cleaning. No chest pain.  No recurrent syncope. She denies palpitations, dizziness, lower ext edema. No claudication. She continues to smoke 1/2 ppd. She does have some SOB. This chronic. She has not been seen by Pulmonary in the past.    Primary care is Dr. Legrand Rams in [place]. Lipids are followed in primary care.    Past Medical History  Diagnosis Date  . Hemorrhage of gastrointestinal tract, unspecified   . Other malaise and fatigue   . Tobacco use disorder   . Parotid tumor   . Neoplasm of malignant     breast  ,HX   . Diabetes mellitus   . Osteopenia   . Hypertension   . Hyperlipidemia   . Depression   . Anxiety state, unspecified     Past Surgical History  Procedure Date  . Mastectomy     left breast -related to breast cancer    Current Outpatient Prescriptions  Medication Sig Dispense Refill  . ALPRAZolam (NIRAVAM) 0.25 MG dissolvable tablet Take 0.25 mg by mouth at bedtime as needed.        Marland Kitchen amLODipine-benazepril (LOTREL) 5-20 MG per capsule Take 1 capsule by mouth daily.        Marland Kitchen aspirin 325 MG tablet Take 325 mg by mouth daily.        Marland Kitchen atorvastatin (LIPITOR) 10 MG tablet Take 10 mg by mouth daily.        . calcium carbonate 200 MG capsule Take 250 mg by mouth 2 (two) times daily with a meal.        .  cholecalciferol (VITAMIN D) 1000 UNITS tablet Take 1,000 Units by mouth daily.        Marland Kitchen esomeprazole (NEXIUM) 40 MG capsule Take 40 mg by mouth daily before breakfast.        . glyBURIDE-metformin (GLUCOVANCE) 5-500 MG per tablet Take 2 tablets by mouth 2 (two) times daily with a meal.        . indapamide (LOZOL) 1.25 MG tablet Take 1.25 mg by mouth every morning.        . Multiple Vitamin (MULTIVITAMIN) capsule Take 1 capsule by mouth daily.        . NON FORMULARY LANTUS FLEXI PEN  AS DIRECTED       . sitaGLIPtin (JANUVIA) 50 MG tablet Take 50 mg by mouth daily.        . Tiotropium Bromide Monohydrate (SPIRIVA HANDIHALER IN) Inhale into the lungs. AS DIRECTED         Allergies  Allergen Reactions  . Penicillins     REACTION: Rash    History   Social History  . Marital Status: Divorced    Spouse Name: N/A    Number of Children: N/A  . Years of Education: N/A   Occupational History  .  Not on file.   Social History Main Topics  . Smoking status: Current Everyday Smoker  . Smokeless tobacco: Not on file  . Alcohol Use: Not on file  . Drug Use: Not on file  . Sexually Active: Not on file   Other Topics Concern  . 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 Started age 60.Drug use -noAlcohol use - noLives with sonEducation - 12 th grade    No family history on file.-0  Review of Systems:  As stated in the HPI and otherwise negative.   BP 134/69  Pulse 73  Ht _0  (1.626 m)  Wt 144 lb (65.318 kg)  BMI 24.72 kg/m2  Physical Examination: General: Well developed, well nourished, NAD HEENT: OP clear, mucus membranes moist SKIN: warm, dry. No rashes. Neuro: No focal deficits Musculoskeletal: Muscle strength 5/5 all ext Psychiatric: Mood and affect normal Neck: No JVD, no carotid  bruits, no thyromegaly, no lymphadenopathy. Lungs:Decreased breath sounds bilaterally, no wheezes, rhonci, crackles Cardiovascular: Regular rate and rhythm. No murmurs, gallops or rubs. Abdomen:Soft. Bowel sounds present. Non-tender.  Extremities: No lower extremity edema. Pulses are 2 + in the bilateral DP/PT.  EKG: NSR rate 73 bpm. 1st degree AV block. Biatrial enlargement.

## 2011-10-27 ENCOUNTER — Institutional Professional Consult (permissible substitution): Payer: Medicare Other | Admitting: Internal Medicine

## 2011-11-27 ENCOUNTER — Ambulatory Visit (INDEPENDENT_AMBULATORY_CARE_PROVIDER_SITE_OTHER): Payer: Medicare Other | Admitting: Internal Medicine

## 2011-11-27 ENCOUNTER — Encounter: Payer: Self-pay | Admitting: Internal Medicine

## 2011-11-27 VITALS — BP 132/80 | HR 73 | Temp 97.9°F | Ht 64.0 in | Wt 149.2 lb

## 2011-11-27 DIAGNOSIS — Z720 Tobacco use: Secondary | ICD-10-CM

## 2011-11-27 DIAGNOSIS — R05 Cough: Secondary | ICD-10-CM | POA: Diagnosis not present

## 2011-11-27 DIAGNOSIS — F17200 Nicotine dependence, unspecified, uncomplicated: Secondary | ICD-10-CM

## 2011-11-27 DIAGNOSIS — R0609 Other forms of dyspnea: Secondary | ICD-10-CM | POA: Diagnosis not present

## 2011-11-27 DIAGNOSIS — R0989 Other specified symptoms and signs involving the circulatory and respiratory systems: Secondary | ICD-10-CM | POA: Diagnosis not present

## 2011-11-27 DIAGNOSIS — R059 Cough, unspecified: Secondary | ICD-10-CM | POA: Diagnosis not present

## 2011-11-27 DIAGNOSIS — R0600 Dyspnea, unspecified: Secondary | ICD-10-CM

## 2011-11-27 DIAGNOSIS — R053 Chronic cough: Secondary | ICD-10-CM
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
## 2011-11-27 NOTE — Patient Instructions (Signed)
You have copd./emphysema Continue spiriva 1 puff daily  - nurse will ensure technique is good Please have full PFT at [facility]  - once done have them fax result to [place] and call us for review  - based on this more medications or not Refer to [facility] pulmonary rehab for dyspnea, emphysema and copd Return in 2 months - to report progress

## 2011-11-28 ENCOUNTER — Encounter: Payer: Self-pay | Admitting: Internal Medicine

## 2011-11-28 DIAGNOSIS — R05 Cough: Secondary | ICD-10-CM | POA: Insufficient documentation

## 2011-11-28 DIAGNOSIS — R053 Chronic cough: Secondary | ICD-10-CM | POA: Insufficient documentation

## 2011-11-28 NOTE — Assessment & Plan Note (Signed)
Discussed with daughter and patient about quitting. Her depression will make it difficult for her to quit. Explained concept to daughter. Will monitor progress. They are aware of dangers of quitting

## 2011-11-28 NOTE — Assessment & Plan Note (Addendum)
Likely due to emphysema seen on CT and deconditioning. Will refer pulmonary rehab at [facility]. Will get PFTs to diagnose severity - will get it at [facility]. ROV in few months to assess progress. Cards appears to have ruled out cardiac cause of dyspnea. Continue spiriva

## 2011-11-28 NOTE — Assessment & Plan Note (Signed)
Mild and not troublesome. Likely due to emphysema and/or ace inhibitor. Will monitor for now. Will address at fu

## 2011-12-01 ENCOUNTER — Ambulatory Visit (HOSPITAL_COMMUNITY)
Admission: RE | Admit: 2011-12-01 | Discharge: 2011-12-01 | Disposition: A | Discharge status: Home / Self Care | Payer: Medicare Other | Source: Ambulatory Visit | Attending: Internal Medicine | Admitting: Internal Medicine

## 2011-12-01 DIAGNOSIS — R0602 Shortness of breath: Secondary | ICD-10-CM | POA: Insufficient documentation

## 2011-12-01 DIAGNOSIS — R0600 Dyspnea, unspecified: Secondary | ICD-10-CM

## 2011-12-01 LAB — PULMONARY FUNCTION TEST

## 2011-12-03 NOTE — Procedures (Signed)
[name], BIN NO.:  [id number]  MEDICAL RECORD NO.:  [id number]  LOCATION:                                 FACILITY:  PHYSICIAN:  Tamla Winkels L. Luan Pulling, M.D.DATE OF BIRTH:  [birth year]  DATE OF PROCEDURE: DATE OF DISCHARGE:                           PULMONARY FUNCTION TEST   Reason for pulmonary function testing is shortness of breath. 1. Spirometry does not reveal a ventilatory defect, but does show     evidence of airflow obstruction. 2. Lung volumes do not show evidence of restrictive change or of air     trapping. 3. DLCO is mildly to moderately reduced, but does correct some when     adjusted for ventilation. 4. There is no significant bronchodilator improvement.     Evaan Tidwell L. Luan Pulling, M.D.     ELH/MEDQ  D:  12/02/2011  T:  12/02/2011  Job:  [id number]  cc:   Brand Males, MD [place] [address] [place] [id number]

## 2011-12-08 DIAGNOSIS — E119 Type 2 diabetes mellitus without complications: Secondary | ICD-10-CM | POA: Diagnosis not present

## 2011-12-08 DIAGNOSIS — E1149 Type 2 diabetes mellitus with other diabetic neurological complication: Secondary | ICD-10-CM | POA: Diagnosis not present

## 2011-12-10 ENCOUNTER — Telehealth: Payer: Self-pay | Admitting: Internal Medicine

## 2011-12-10 NOTE — Telephone Encounter (Signed)
pft results received and placed in MR look-at. Mila Doce Bing, CMA

## 2011-12-10 NOTE — Telephone Encounter (Signed)
She had pft at [facility] 12/02/11 I believe. Dr Luan Pulling only sent interpretation. I need actual result . THey can fax or scan into epic

## 2011-12-21 ENCOUNTER — Telehealth: Payer: Self-pay | Admitting: Internal Medicine

## 2011-12-21 NOTE — Telephone Encounter (Signed)
PFts 12/01/11 showsisolated low dlco 65%  Please let her know that lung strength is good but some reduction in ability of oxygen to go from lungs to blood that can contribute to shortness of breath. I will explain at fu. Till then continue spiriva, attend rehab  Thanks MR

## 2011-12-28 NOTE — Telephone Encounter (Signed)
Returning call can be reached at [phone].Elnita Maxwell

## 2011-12-28 NOTE — Telephone Encounter (Signed)
[id number].Jennifer Castillo, CMA  

## 2011-12-29 NOTE — Telephone Encounter (Signed)
Pt advised and f/u set for 03-11-12. [place] Bing, CMA

## 2012-01-18 DIAGNOSIS — E7800 Pure hypercholesterolemia, unspecified: Secondary | ICD-10-CM | POA: Diagnosis not present

## 2012-01-18 DIAGNOSIS — E119 Type 2 diabetes mellitus without complications: Secondary | ICD-10-CM | POA: Diagnosis not present

## 2012-01-18 DIAGNOSIS — I10 Essential (primary) hypertension: Secondary | ICD-10-CM | POA: Diagnosis not present

## 2012-01-19 DIAGNOSIS — E119 Type 2 diabetes mellitus without complications: Secondary | ICD-10-CM | POA: Diagnosis not present

## 2012-01-26 DIAGNOSIS — R7309 Other abnormal glucose: Secondary | ICD-10-CM | POA: Diagnosis not present

## 2012-01-26 DIAGNOSIS — I10 Essential (primary) hypertension: Secondary | ICD-10-CM | POA: Diagnosis not present

## 2012-01-26 DIAGNOSIS — E7800 Pure hypercholesterolemia, unspecified: Secondary | ICD-10-CM | POA: Diagnosis not present

## 2012-01-26 DIAGNOSIS — R799 Abnormal finding of blood chemistry, unspecified: Secondary | ICD-10-CM | POA: Diagnosis not present

## 2012-01-28 ENCOUNTER — Other Ambulatory Visit (HOSPITAL_COMMUNITY): Payer: Self-pay | Admitting: Internal Medicine

## 2012-01-28 DIAGNOSIS — Z139 Encounter for screening, unspecified: Secondary | ICD-10-CM

## 2012-02-09 ENCOUNTER — Ambulatory Visit (HOSPITAL_COMMUNITY)
Admission: RE | Admit: 2012-02-09 | Discharge: 2012-02-09 | Disposition: A | Discharge status: Home / Self Care | Payer: Medicare Other | Source: Ambulatory Visit | Attending: Internal Medicine | Admitting: Internal Medicine

## 2012-02-09 DIAGNOSIS — Z1231 Encounter for screening mammogram for malignant neoplasm of breast: Secondary | ICD-10-CM | POA: Diagnosis not present

## 2012-02-09 DIAGNOSIS — Z139 Encounter for screening, unspecified: Secondary | ICD-10-CM

## 2012-02-17 DIAGNOSIS — E119 Type 2 diabetes mellitus without complications: Secondary | ICD-10-CM | POA: Diagnosis not present

## 2012-02-17 DIAGNOSIS — E1149 Type 2 diabetes mellitus with other diabetic neurological complication: Secondary | ICD-10-CM | POA: Diagnosis not present

## 2012-03-11 ENCOUNTER — Ambulatory Visit: Payer: Medicare Other | Admitting: Internal Medicine

## 2012-04-22 DIAGNOSIS — E7800 Pure hypercholesterolemia, unspecified: Secondary | ICD-10-CM | POA: Diagnosis not present

## 2012-04-22 DIAGNOSIS — E119 Type 2 diabetes mellitus without complications: Secondary | ICD-10-CM | POA: Diagnosis not present

## 2012-04-22 DIAGNOSIS — I10 Essential (primary) hypertension: Secondary | ICD-10-CM | POA: Diagnosis not present

## 2012-04-26 DIAGNOSIS — E1149 Type 2 diabetes mellitus with other diabetic neurological complication: Secondary | ICD-10-CM | POA: Diagnosis not present

## 2012-04-26 DIAGNOSIS — E119 Type 2 diabetes mellitus without complications: Secondary | ICD-10-CM | POA: Diagnosis not present

## 2012-07-05 DIAGNOSIS — E119 Type 2 diabetes mellitus without complications: Secondary | ICD-10-CM | POA: Diagnosis not present

## 2012-07-05 DIAGNOSIS — E1149 Type 2 diabetes mellitus with other diabetic neurological complication: Secondary | ICD-10-CM | POA: Diagnosis not present

## 2012-08-12 DIAGNOSIS — E119 Type 2 diabetes mellitus without complications: Secondary | ICD-10-CM | POA: Diagnosis not present

## 2012-08-12 DIAGNOSIS — E7800 Pure hypercholesterolemia, unspecified: Secondary | ICD-10-CM | POA: Diagnosis not present

## 2012-08-12 DIAGNOSIS — I10 Essential (primary) hypertension: Secondary | ICD-10-CM | POA: Diagnosis not present

## 2012-08-12 DIAGNOSIS — Z23 Encounter for immunization: Secondary | ICD-10-CM | POA: Diagnosis not present

## 2012-09-13 DIAGNOSIS — E1149 Type 2 diabetes mellitus with other diabetic neurological complication: Secondary | ICD-10-CM | POA: Diagnosis not present

## 2012-09-13 DIAGNOSIS — E119 Type 2 diabetes mellitus without complications: Secondary | ICD-10-CM | POA: Diagnosis not present

## 2012-11-10 DIAGNOSIS — F410 Panic disorder [episodic paroxysmal anxiety] without agoraphobia: Secondary | ICD-10-CM | POA: Diagnosis not present

## 2012-11-10 DIAGNOSIS — E119 Type 2 diabetes mellitus without complications: Secondary | ICD-10-CM | POA: Diagnosis not present

## 2012-11-10 DIAGNOSIS — I10 Essential (primary) hypertension: Secondary | ICD-10-CM | POA: Diagnosis not present

## 2012-11-17 DIAGNOSIS — E119 Type 2 diabetes mellitus without complications: Secondary | ICD-10-CM | POA: Diagnosis not present

## 2012-11-22 DIAGNOSIS — Z961 Presence of intraocular lens: Secondary | ICD-10-CM | POA: Diagnosis not present

## 2012-11-22 DIAGNOSIS — E119 Type 2 diabetes mellitus without complications: Secondary | ICD-10-CM | POA: Diagnosis not present

## 2012-12-28 DIAGNOSIS — E1149 Type 2 diabetes mellitus with other diabetic neurological complication: Secondary | ICD-10-CM | POA: Diagnosis not present

## 2012-12-28 DIAGNOSIS — E119 Type 2 diabetes mellitus without complications: Secondary | ICD-10-CM | POA: Diagnosis not present

## 2013-02-09 DIAGNOSIS — I10 Essential (primary) hypertension: Secondary | ICD-10-CM | POA: Diagnosis not present

## 2013-02-09 DIAGNOSIS — E119 Type 2 diabetes mellitus without complications: Secondary | ICD-10-CM | POA: Diagnosis not present

## 2013-02-09 DIAGNOSIS — E7800 Pure hypercholesterolemia, unspecified: Secondary | ICD-10-CM | POA: Diagnosis not present

## 2013-02-15 ENCOUNTER — Other Ambulatory Visit (HOSPITAL_COMMUNITY): Payer: Self-pay | Admitting: Internal Medicine

## 2013-02-15 DIAGNOSIS — Z139 Encounter for screening, unspecified: Secondary | ICD-10-CM

## 2013-02-16 ENCOUNTER — Ambulatory Visit (INDEPENDENT_AMBULATORY_CARE_PROVIDER_SITE_OTHER): Payer: Medicare Other | Admitting: Cardiovascular Disease

## 2013-02-16 ENCOUNTER — Encounter: Payer: Self-pay | Admitting: Cardiovascular Disease

## 2013-02-16 VITALS — BP 122/60 | HR 74 | Ht 64.0 in | Wt 148.0 lb

## 2013-02-16 DIAGNOSIS — I351 Nonrheumatic aortic (valve) insufficiency: Secondary | ICD-10-CM

## 2013-02-16 DIAGNOSIS — I359 Nonrheumatic aortic valve disorder, unspecified: Secondary | ICD-10-CM | POA: Diagnosis not present

## 2013-02-16 DIAGNOSIS — I2510 Atherosclerotic heart disease of native coronary artery without angina pectoris: Secondary | ICD-10-CM | POA: Diagnosis not present

## 2013-02-16 NOTE — Patient Instructions (Signed)
Your physician wants you to follow-up in:  12 months.  You will receive a reminder letter in the mail two months in advance. If you don't receive a letter, please call our office to schedule the follow-up appointment.  Your physician has requested that you have an echocardiogram. Echocardiography is a painless test that uses sound waves to create images of your heart. It provides your doctor with information about the size and shape of your heart and how well your heart's chambers and valves are working. This procedure takes approximately one hour. There are no restrictions for this procedure.

## 2013-02-16 NOTE — Addendum Note (Signed)
Addended by: Nyoka Lint on: 02/16/2013 01:17 PM   Modules accepted: Orders

## 2013-02-16 NOTE — Progress Notes (Signed)
History of Present Illness: 79 yo AAF with history of mild CAD, DM, HTN, hyperlipidemia, PAD, tobacco abuse admitted to [facility] May 13,2011 with a syncopal episode. Her troponin was elevated at 0.96 and her D-dimer was elevated at 0.93. CT angiogram chest without evidence of PE. EKG with T wave inversions anterior and inferior. Cardiac cath on 03/31/10 with mild disease in the LAD but no obstructive lesions. Echo with normal LV size and function with mild AI. Carotid dopplers normal. Her CT chest did show centrilobular emphysema. I last saw her in December 2012.  She is here today for cardiac follow up. She has been doing well. She does her own cooking and cleaning. No chest pain. No recurrent syncope. She denies palpitations, lower ext edema. No claudication. She continues to smoke 6 cigarettes per day. She does have dizziness when she is scrubbing floors. Breathing is stable.   Primary Care Physician: Dr. Legrand Rams in [place]  Last Lipid Profile: Followed in primary care.   Past Medical History  Diagnosis Date  . Hemorrhage of gastrointestinal tract, unspecified   . Other malaise and fatigue   . Tobacco use disorder   . Parotid tumor   . Neoplasm of malignant     breast  ,HX   . Diabetes mellitus   . Osteopenia   . Hypertension   . Hyperlipidemia   . Depression   . Anxiety state, unspecified     Past Surgical History  Procedure Laterality Date  . Mastectomy  1994    left breast -related to breast cancer  . Vesicovaginal fistula closure w/ tah  1979  . Cholecystectomy  2008  . Hemorrhoid surgery  1960s    Current Outpatient Prescriptions  Medication Sig Dispense Refill  . ALPRAZolam (NIRAVAM) 0.25 MG dissolvable tablet Take 0.25 mg by mouth at bedtime as needed.        Marland Kitchen amLODipine-benazepril (LOTREL) 5-20 MG per capsule Take 1 capsule by mouth daily.        Marland Kitchen aspirin 325 MG tablet Take 325 mg by mouth daily.        Marland Kitchen atorvastatin (LIPITOR) 10 MG tablet Take 10 mg  by mouth daily.        . calcium carbonate 200 MG capsule Take 250 mg by mouth 2 (two) times daily with a meal.        . cholecalciferol (VITAMIN D) 1000 UNITS tablet Take 1,000 Units by mouth daily.        Marland Kitchen esomeprazole (NEXIUM) 40 MG capsule Take 40 mg by mouth daily before breakfast.        . glyBURIDE-metformin (GLUCOVANCE) 5-500 MG per tablet Take 2 tablets by mouth 2 (two) times daily with a meal.        . indapamide (LOZOL) 1.25 MG tablet Take 1.25 mg by mouth every morning.        . Multiple Vitamin (MULTIVITAMIN) capsule Take 1 capsule by mouth daily.        . NON FORMULARY LANTUS FLEXI PEN  AS DIRECTED       . Tiotropium Bromide Monohydrate (SPIRIVA HANDIHALER IN) Inhale into the lungs. AS DIRECTED        No current facility-administered medications for this visit.    Allergies  Allergen Reactions  . Penicillins     REACTION: Rash    History   Social History  . Marital Status: Divorced    Spouse Name: N/A    Number of Children: N/A  . Years of  Education: N/A   Occupational History  . RETIRED FACTORY WORKER     [employer]   Social History Main Topics  . Smoking status: Current Every Day Smoker -- 0.50 packs/day for 40 years    Types: Cigarettes  . Smokeless tobacco: Not on file  . Alcohol Use: Not on file  . Drug Use: Not on file  . Sexually Active: Not on file   Other Topics Concern  . 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 Started age 54.   Drug use -no   Alcohol use - no   Lives with son   Education - 74 th grade                   Family History  Problem Relation Age of Onset  . Heart failure Mother     CHF  . Cirrhosis Brother   . Lung cancer Father     brain mets  . Kidney cancer Mother   . Kidney cancer Sister     Review of Systems:  As  stated in the HPI and otherwise negative.   BP 122/60  Pulse 74  Ht _0  (1.626 m)  Wt 148 lb (67.132 kg)  BMI 25.39 kg/m2  SpO2 93%  Physical Examination: General: Well developed, well nourished, NAD HEENT: OP clear, mucus membranes moist SKIN: warm, dry. No rashes. Neuro: No focal deficits Musculoskeletal: Muscle strength 5/5 all ext Psychiatric: Mood and affect normal Neck: No JVD, no carotid bruits, no thyromegaly, no lymphadenopathy. Lungs:Clear bilaterally, no wheezes, rhonci, crackles Cardiovascular: Regular rate and rhythm. No murmurs, gallops or rubs. Abdomen:Soft. Bowel sounds present. Non-tender.  Extremities: No lower extremity edema. Pulses are 2 + in the bilateral DP/PT.  EKG: NSR, rate 74 bpm. Biatrial enlargment.   Assessment and Plan:   1. CAD: Mild CAD by cath May 2011. She is on a statin, ASA and Ace-inhibitor. No changes in therapy. I have asked her once again to stop smoking as this will accelerate the progression of her CAD. She wishes to stop but does not want any cessation aides at this time.       2. HYPERTENSION:  BP well controlled. No changes.     3. Dyspnea: Stable. Likely from her COPD. Followed in Pulmonary office.   4. Tobacco abuse: Cessation recommended.     5. Aortic insufficiency: Mild by echo 2011. Will repeat echo with dizziness.

## 2013-02-20 ENCOUNTER — Ambulatory Visit (HOSPITAL_COMMUNITY)
Admission: RE | Admit: 2013-02-20 | Discharge: 2013-02-20 | Disposition: A | Discharge status: Home / Self Care | Payer: Medicare Other | Source: Ambulatory Visit | Attending: Internal Medicine | Admitting: Internal Medicine

## 2013-02-20 DIAGNOSIS — Z1231 Encounter for screening mammogram for malignant neoplasm of breast: Secondary | ICD-10-CM | POA: Diagnosis not present

## 2013-02-20 DIAGNOSIS — Z139 Encounter for screening, unspecified: Secondary | ICD-10-CM

## 2013-03-02 ENCOUNTER — Ambulatory Visit (HOSPITAL_COMMUNITY): Payer: Medicare Other | Attending: Cardiovascular Disease

## 2013-03-02 DIAGNOSIS — I359 Nonrheumatic aortic valve disorder, unspecified: Secondary | ICD-10-CM | POA: Insufficient documentation

## 2013-03-02 DIAGNOSIS — R42 Dizziness and giddiness: Secondary | ICD-10-CM | POA: Insufficient documentation

## 2013-03-02 DIAGNOSIS — I351 Nonrheumatic aortic (valve) insufficiency: Secondary | ICD-10-CM

## 2013-03-02 NOTE — Progress Notes (Signed)
Echocardiogram performed.  

## 2013-03-17 DIAGNOSIS — E1149 Type 2 diabetes mellitus with other diabetic neurological complication: Secondary | ICD-10-CM | POA: Diagnosis not present

## 2013-03-17 DIAGNOSIS — E119 Type 2 diabetes mellitus without complications: Secondary | ICD-10-CM | POA: Diagnosis not present

## 2013-04-12 ENCOUNTER — Encounter (HOSPITAL_COMMUNITY): Payer: Self-pay | Admitting: Emergency Medicine

## 2013-04-12 DIAGNOSIS — I10 Essential (primary) hypertension: Secondary | ICD-10-CM | POA: Diagnosis not present

## 2013-04-12 DIAGNOSIS — K3580 Unspecified acute appendicitis: Principal | ICD-10-CM | POA: Diagnosis present

## 2013-04-12 DIAGNOSIS — E119 Type 2 diabetes mellitus without complications: Secondary | ICD-10-CM | POA: Diagnosis present

## 2013-04-12 DIAGNOSIS — E876 Hypokalemia: Secondary | ICD-10-CM | POA: Diagnosis not present

## 2013-04-12 DIAGNOSIS — F329 Major depressive disorder, single episode, unspecified: Secondary | ICD-10-CM | POA: Diagnosis present

## 2013-04-12 DIAGNOSIS — F17200 Nicotine dependence, unspecified, uncomplicated: Secondary | ICD-10-CM | POA: Diagnosis present

## 2013-04-12 DIAGNOSIS — E785 Hyperlipidemia, unspecified: Secondary | ICD-10-CM | POA: Diagnosis present

## 2013-04-12 DIAGNOSIS — F3289 Other specified depressive episodes: Secondary | ICD-10-CM | POA: Diagnosis present

## 2013-04-12 DIAGNOSIS — Z79899 Other long term (current) drug therapy: Secondary | ICD-10-CM

## 2013-04-12 DIAGNOSIS — R1031 Right lower quadrant pain: Secondary | ICD-10-CM | POA: Diagnosis not present

## 2013-04-12 DIAGNOSIS — Z9010 Acquired absence of unspecified breast and nipple: Secondary | ICD-10-CM

## 2013-04-12 DIAGNOSIS — Z853 Personal history of malignant neoplasm of breast: Secondary | ICD-10-CM

## 2013-04-12 DIAGNOSIS — F411 Generalized anxiety disorder: Secondary | ICD-10-CM | POA: Diagnosis present

## 2013-04-12 NOTE — ED Notes (Signed)
Per family member, patient has had abdominal cramping x 3 days.  Patient c/o that she feels like her stomach and esophagus are raw.  Family member states patient has not been eating or drinking like normal.

## 2013-04-13 ENCOUNTER — Emergency Department (HOSPITAL_COMMUNITY): Payer: Medicare Other

## 2013-04-13 ENCOUNTER — Inpatient Hospital Stay (HOSPITAL_COMMUNITY)
Admission: EM | Admit: 2013-04-13 | Discharge: 2013-04-14 | DRG: 343 | Disposition: A | Discharge status: Home Health | Payer: Medicare Other | Attending: General Surgery | Admitting: General Surgery

## 2013-04-13 ENCOUNTER — Inpatient Hospital Stay (HOSPITAL_COMMUNITY): Payer: Medicare Other | Admitting: Anesthesiology

## 2013-04-13 ENCOUNTER — Encounter (HOSPITAL_COMMUNITY): Payer: Self-pay | Admitting: *Deleted

## 2013-04-13 ENCOUNTER — Encounter (HOSPITAL_COMMUNITY)
Admission: EM | Disposition: A | Discharge status: Home Health | Payer: Self-pay | Source: Home / Self Care | Attending: General Surgery

## 2013-04-13 ENCOUNTER — Encounter (HOSPITAL_COMMUNITY): Payer: Self-pay | Admitting: Anesthesiology

## 2013-04-13 DIAGNOSIS — E876 Hypokalemia: Secondary | ICD-10-CM

## 2013-04-13 DIAGNOSIS — K3580 Unspecified acute appendicitis: Secondary | ICD-10-CM | POA: Diagnosis not present

## 2013-04-13 DIAGNOSIS — I10 Essential (primary) hypertension: Secondary | ICD-10-CM | POA: Diagnosis not present

## 2013-04-13 DIAGNOSIS — K37 Unspecified appendicitis: Secondary | ICD-10-CM

## 2013-04-13 DIAGNOSIS — E119 Type 2 diabetes mellitus without complications: Secondary | ICD-10-CM | POA: Diagnosis not present

## 2013-04-13 HISTORY — PX: LAPAROSCOPIC APPENDECTOMY: SHX408

## 2013-04-13 LAB — CBC WITH DIFFERENTIAL/PLATELET
Basophils Absolute: 0 10*3/uL (ref 0.0–0.1)
Basophils Relative: 0 % (ref 0–1)
Eosinophils Absolute: 0.1 10*3/uL (ref 0.0–0.7)
Eosinophils Relative: 1 % (ref 0–5)
HCT: 37.2 % (ref 36.0–46.0)
Hemoglobin: 13.3 g/dL (ref 12.0–15.0)
Lymphocytes Relative: 16 % (ref 12–46)
Lymphs Abs: 2.2 10*3/uL (ref 0.7–4.0)
MCH: 33.5 pg (ref 26.0–34.0)
MCHC: 35.8 g/dL (ref 30.0–36.0)
MCV: 93.7 fL (ref 78.0–100.0)
Monocytes Absolute: 1 10*3/uL (ref 0.1–1.0)
Monocytes Relative: 7 % (ref 3–12)
Neutro Abs: 10.3 10*3/uL — ABNORMAL HIGH (ref 1.7–7.7)
Neutrophils Relative %: 76 % (ref 43–77)
Platelets: 285 10*3/uL (ref 150–400)
RBC: 3.97 MIL/uL (ref 3.87–5.11)
RDW: 13.1 % (ref 11.5–15.5)
WBC: 13.6 10*3/uL — ABNORMAL HIGH (ref 4.0–10.5)

## 2013-04-13 LAB — URINALYSIS, ROUTINE W REFLEX MICROSCOPIC
Bilirubin Urine: NEGATIVE
Glucose, UA: 100 mg/dL — AB
Ketones, ur: NEGATIVE mg/dL
Nitrite: NEGATIVE
Protein, ur: NEGATIVE mg/dL
Specific Gravity, Urine: <1.005 — ABNORMAL LOW (ref 1.005–1.030)
Urobilinogen, UA: 0.2 mg/dL (ref 0.0–1.0)
pH: 7 (ref 5.0–8.0)

## 2013-04-13 LAB — SURGICAL PCR SCREEN
MRSA, PCR: NEGATIVE
Staphylococcus aureus: NEGATIVE

## 2013-04-13 LAB — COMPREHENSIVE METABOLIC PANEL
ALT: 15 U/L (ref 0–35)
AST: 16 U/L (ref 0–37)
Albumin: 3.7 g/dL (ref 3.5–5.2)
Alkaline Phosphatase: 142 U/L — ABNORMAL HIGH (ref 39–117)
BUN: 10 mg/dL (ref 6–23)
CO2: 27 mEq/L (ref 19–32)
Calcium: 9.2 mg/dL (ref 8.4–10.5)
Chloride: 88 mEq/L — ABNORMAL LOW (ref 96–112)
Creatinine, Ser: 0.45 mg/dL — ABNORMAL LOW (ref 0.50–1.10)
GFR calc Af Amer: >90 mL/min (ref >90)
GFR calc non Af Amer: >90 mL/min (ref >90)
Glucose, Bld: 204 mg/dL — ABNORMAL HIGH (ref 70–99)
Potassium: 3 mEq/L — ABNORMAL LOW (ref 3.5–5.1)
Sodium: 128 mEq/L — ABNORMAL LOW (ref 135–145)
Total Bilirubin: 1 mg/dL (ref 0.3–1.2)
Total Protein: 7.8 g/dL (ref 6.0–8.3)

## 2013-04-13 LAB — GLUCOSE, CAPILLARY
Glucose-Capillary: 167 mg/dL — ABNORMAL HIGH (ref 70–99)
Glucose-Capillary: 183 mg/dL — ABNORMAL HIGH (ref 70–99)
Glucose-Capillary: 260 mg/dL — ABNORMAL HIGH (ref 70–99)
Glucose-Capillary: 295 mg/dL — ABNORMAL HIGH (ref 70–99)

## 2013-04-13 LAB — URINE MICROSCOPIC-ADD ON

## 2013-04-13 LAB — TROPONIN I: Troponin I: <0.3 ng/mL (ref <0.30)

## 2013-04-13 LAB — LIPASE, BLOOD: Lipase: 20 U/L (ref 11–59)

## 2013-04-13 SURGERY — APPENDECTOMY, LAPAROSCOPIC
Anesthesia: General | Site: Abdomen | Wound class: Contaminated

## 2013-04-13 MED ORDER — LIDOCAINE HCL (CARDIAC) 20 MG/ML IV SOLN
INTRAVENOUS | Status: DC | PRN
  Administered 2013-04-13: 50 mg via INTRAVENOUS

## 2013-04-13 MED ORDER — NEOSTIGMINE METHYLSULFATE 1 MG/ML IJ SOLN
INTRAMUSCULAR | Status: DC | PRN
  Administered 2013-04-13: 4 mg via INTRAVENOUS

## 2013-04-13 MED ORDER — FENTANYL CITRATE 0.05 MG/ML IJ SOLN
INTRAMUSCULAR | Status: AC
  Filled 2013-04-13: qty 5

## 2013-04-13 MED ORDER — METRONIDAZOLE IN NACL 5-0.79 MG/ML-% IV SOLN: 500.0000 mg | INTRAVENOUS | Status: DC

## 2013-04-13 MED ORDER — ROCURONIUM BROMIDE 50 MG/5ML IV SOLN
INTRAVENOUS | Status: AC
  Filled 2013-04-13: qty 1

## 2013-04-13 MED ORDER — ONDANSETRON HCL 4 MG/2ML IJ SOLN: 4.0000 mg | Freq: Once | INTRAMUSCULAR | Status: DC | PRN

## 2013-04-13 MED ORDER — ACETAMINOPHEN 325 MG PO TABS: 650.0000 mg | ORAL_TABLET | ORAL | Status: DC | PRN

## 2013-04-13 MED ORDER — INSULIN GLARGINE 100 UNIT/ML ~~LOC~~ SOLN
3.0000 [IU] | Freq: Every day | SUBCUTANEOUS | Status: DC
  Administered 2013-04-13: 7 [IU] via SUBCUTANEOUS
  Filled 2013-04-13 (×4): qty 0.07

## 2013-04-13 MED ORDER — GLYCOPYRROLATE 0.2 MG/ML IJ SOLN
INTRAMUSCULAR | Status: DC | PRN
  Administered 2013-04-13: 0.6 mg via INTRAVENOUS

## 2013-04-13 MED ORDER — PANTOPRAZOLE SODIUM 40 MG IV SOLR
40.0000 mg | Freq: Every day | INTRAVENOUS | Status: DC
  Administered 2013-04-13: 40 mg via INTRAVENOUS
  Filled 2013-04-13: qty 40

## 2013-04-13 MED ORDER — FENTANYL CITRATE 0.05 MG/ML IJ SOLN
25.0000 ug | INTRAMUSCULAR | Status: DC | PRN
  Administered 2013-04-13 (×2): 50 ug via INTRAVENOUS

## 2013-04-13 MED ORDER — ETOMIDATE 2 MG/ML IV SOLN
INTRAVENOUS | Status: DC | PRN
  Administered 2013-04-13: 12 mg via INTRAVENOUS

## 2013-04-13 MED ORDER — NEOSTIGMINE METHYLSULFATE 1 MG/ML IJ SOLN
INTRAMUSCULAR | Status: AC
  Filled 2013-04-13: qty 1

## 2013-04-13 MED ORDER — BENAZEPRIL HCL 10 MG PO TABS
20.0000 mg | ORAL_TABLET | Freq: Every day | ORAL | Status: DC
  Administered 2013-04-13 – 2013-04-14 (×2): 20 mg via ORAL
  Filled 2013-04-13 (×3): qty 2

## 2013-04-13 MED ORDER — EPHEDRINE SULFATE 50 MG/ML IJ SOLN
INTRAMUSCULAR | Status: DC | PRN
  Administered 2013-04-13: 10 mg via INTRAVENOUS

## 2013-04-13 MED ORDER — LEVOFLOXACIN IN D5W 500 MG/100ML IV SOLN
500.0000 mg | Freq: Once | INTRAVENOUS | Status: AC
  Administered 2013-04-13: 500 mg via INTRAVENOUS
  Filled 2013-04-13: qty 100

## 2013-04-13 MED ORDER — INDAPAMIDE 2.5 MG PO TABS
1.2500 mg | ORAL_TABLET | ORAL | Status: DC
  Administered 2013-04-14: 1.25 mg via ORAL
  Filled 2013-04-13 (×4): qty 1

## 2013-04-13 MED ORDER — MUPIROCIN 2 % EX OINT
TOPICAL_OINTMENT | CUTANEOUS | Status: AC
  Filled 2013-04-13: qty 22

## 2013-04-13 MED ORDER — FENTANYL CITRATE 0.05 MG/ML IJ SOLN
INTRAMUSCULAR | Status: DC | PRN
  Administered 2013-04-13: 50 ug via INTRAVENOUS
  Administered 2013-04-13: 100 ug via INTRAVENOUS

## 2013-04-13 MED ORDER — ENOXAPARIN SODIUM 40 MG/0.4ML ~~LOC~~ SOLN
40.0000 mg | SUBCUTANEOUS | Status: DC
  Administered 2013-04-13 – 2013-04-14 (×2): 40 mg via SUBCUTANEOUS
  Filled 2013-04-13 (×2): qty 0.4

## 2013-04-13 MED ORDER — METFORMIN HCL 500 MG PO TABS
1000.0000 mg | ORAL_TABLET | Freq: Two times a day (BID) | ORAL | Status: DC
  Administered 2013-04-13: 1000 mg via ORAL
  Filled 2013-04-13 (×2): qty 2

## 2013-04-13 MED ORDER — AMLODIPINE BESY-BENAZEPRIL HCL 5-20 MG PO CAPS: 1.0000 | ORAL_CAPSULE | Freq: Every day | ORAL | Status: DC

## 2013-04-13 MED ORDER — ACETAMINOPHEN 325 MG PO TABS
650.0000 mg | ORAL_TABLET | Freq: Once | ORAL | Status: AC
  Administered 2013-04-13: 650 mg via ORAL
  Filled 2013-04-13: qty 2

## 2013-04-13 MED ORDER — LACTATED RINGERS IV SOLN: INTRAVENOUS | Status: DC

## 2013-04-13 MED ORDER — FENTANYL CITRATE 0.05 MG/ML IJ SOLN
100.0000 ug | Freq: Once | INTRAMUSCULAR | Status: AC
  Administered 2013-04-13: 100 ug via INTRAVENOUS
  Filled 2013-04-13: qty 2

## 2013-04-13 MED ORDER — ONDANSETRON HCL 4 MG/2ML IJ SOLN
4.0000 mg | Freq: Once | INTRAMUSCULAR | Status: AC
  Administered 2013-04-13: 4 mg via INTRAVENOUS
  Filled 2013-04-13: qty 2

## 2013-04-13 MED ORDER — SODIUM CHLORIDE 0.9 % IV BOLUS (SEPSIS)
250.0000 mL | Freq: Once | INTRAVENOUS | Status: AC
  Administered 2013-04-13: 250 mL via INTRAVENOUS

## 2013-04-13 MED ORDER — METRONIDAZOLE 500 MG PO TABS
500.0000 mg | ORAL_TABLET | Freq: Three times a day (TID) | ORAL | Status: DC
  Administered 2013-04-13 – 2013-04-14 (×3): 500 mg via ORAL
  Filled 2013-04-13 (×3): qty 1

## 2013-04-13 MED ORDER — METRONIDAZOLE IN NACL 5-0.79 MG/ML-% IV SOLN
INTRAVENOUS | Status: AC
  Filled 2013-04-13: qty 100

## 2013-04-13 MED ORDER — ONDANSETRON HCL 4 MG/2ML IJ SOLN
4.0000 mg | Freq: Four times a day (QID) | INTRAMUSCULAR | Status: DC | PRN
  Administered 2013-04-13: 4 mg via INTRAVENOUS
  Filled 2013-04-13: qty 2

## 2013-04-13 MED ORDER — METRONIDAZOLE IN NACL 5-0.79 MG/ML-% IV SOLN
INTRAVENOUS | Status: DC | PRN
  Administered 2013-04-13: 500 mg via INTRAVENOUS

## 2013-04-13 MED ORDER — SUCCINYLCHOLINE CHLORIDE 20 MG/ML IJ SOLN
INTRAMUSCULAR | Status: AC
  Filled 2013-04-13: qty 1

## 2013-04-13 MED ORDER — LACTATED RINGERS IV SOLN: INTRAVENOUS | Status: DC | PRN

## 2013-04-13 MED ORDER — LEVOFLOXACIN 500 MG PO TABS
500.0000 mg | ORAL_TABLET | Freq: Every day | ORAL | Status: DC
  Administered 2013-04-14: 500 mg via ORAL
  Filled 2013-04-13: qty 1

## 2013-04-13 MED ORDER — GLYBURIDE-METFORMIN 5-500 MG PO TABS: 2.0000 | ORAL_TABLET | Freq: Two times a day (BID) | ORAL | Status: DC

## 2013-04-13 MED ORDER — PHENOL 1.4 % MT LIQD
1.0000 | OROMUCOSAL | Status: DC | PRN
  Administered 2013-04-13: 1 via OROMUCOSAL
  Filled 2013-04-13: qty 177

## 2013-04-13 MED ORDER — ROCURONIUM BROMIDE 100 MG/10ML IV SOLN
INTRAVENOUS | Status: DC | PRN
  Administered 2013-04-13: 20 mg via INTRAVENOUS

## 2013-04-13 MED ORDER — LACTATED RINGERS IV SOLN
INTRAVENOUS | Status: DC | PRN
  Administered 2013-04-13: 12:00:00 via INTRAVENOUS

## 2013-04-13 MED ORDER — MIDAZOLAM HCL 2 MG/2ML IJ SOLN
INTRAMUSCULAR | Status: AC
  Filled 2013-04-13: qty 2

## 2013-04-13 MED ORDER — AMLODIPINE BESYLATE 5 MG PO TABS
5.0000 mg | ORAL_TABLET | Freq: Every day | ORAL | Status: DC
  Administered 2013-04-13 – 2013-04-14 (×2): 5 mg via ORAL
  Filled 2013-04-13 (×2): qty 1

## 2013-04-13 MED ORDER — ONDANSETRON HCL 4 MG/2ML IJ SOLN
4.0000 mg | Freq: Once | INTRAMUSCULAR | Status: AC
  Administered 2013-04-13: 4 mg via INTRAVENOUS

## 2013-04-13 MED ORDER — INSULIN GLARGINE 100 UNIT/ML ~~LOC~~ SOLN
SUBCUTANEOUS | Status: AC
  Filled 2013-04-13: qty 10

## 2013-04-13 MED ORDER — SUCCINYLCHOLINE CHLORIDE 20 MG/ML IJ SOLN
INTRAMUSCULAR | Status: DC | PRN
  Administered 2013-04-13: 100 mg via INTRAVENOUS

## 2013-04-13 MED ORDER — SODIUM CHLORIDE 0.9 % IV SOLN
INTRAVENOUS | Status: DC
  Administered 2013-04-13: 125 mL/h via INTRAVENOUS
  Administered 2013-04-13: 11:00:00 via INTRAVENOUS

## 2013-04-13 MED ORDER — EPHEDRINE SULFATE 50 MG/ML IJ SOLN
INTRAMUSCULAR | Status: AC
  Filled 2013-04-13: qty 1

## 2013-04-13 MED ORDER — BUPIVACAINE HCL 0.5 % IJ SOLN
INTRAMUSCULAR | Status: DC | PRN
  Administered 2013-04-13: 10 mL

## 2013-04-13 MED ORDER — ETOMIDATE 2 MG/ML IV SOLN
INTRAVENOUS | Status: AC
  Filled 2013-04-13: qty 10

## 2013-04-13 MED ORDER — METRONIDAZOLE IN NACL 5-0.79 MG/ML-% IV SOLN
500.0000 mg | Freq: Once | INTRAVENOUS | Status: AC
  Administered 2013-04-13: 500 mg via INTRAVENOUS
  Filled 2013-04-13: qty 100

## 2013-04-13 MED ORDER — ONDANSETRON HCL 4 MG/2ML IJ SOLN
INTRAMUSCULAR | Status: AC
  Filled 2013-04-13: qty 2

## 2013-04-13 MED ORDER — FENTANYL CITRATE 0.05 MG/ML IJ SOLN
INTRAMUSCULAR | Status: AC
  Filled 2013-04-13: qty 2

## 2013-04-13 MED ORDER — MIDAZOLAM HCL 2 MG/2ML IJ SOLN
1.0000 mg | INTRAMUSCULAR | Status: DC | PRN
  Administered 2013-04-13: 2 mg via INTRAVENOUS

## 2013-04-13 MED ORDER — IOHEXOL 300 MG/ML  SOLN
50.0000 mL | Freq: Once | INTRAMUSCULAR | Status: AC | PRN
  Administered 2013-04-13: 50 mL via ORAL

## 2013-04-13 MED ORDER — MORPHINE SULFATE 2 MG/ML IJ SOLN: 1.0000 mg | INTRAMUSCULAR | Status: DC | PRN

## 2013-04-13 MED ORDER — BUPIVACAINE HCL (PF) 0.5 % IJ SOLN
INTRAMUSCULAR | Status: AC
  Filled 2013-04-13: qty 30

## 2013-04-13 MED ORDER — LIDOCAINE HCL (PF) 1 % IJ SOLN
INTRAMUSCULAR | Status: AC
  Filled 2013-04-13: qty 5

## 2013-04-13 MED ORDER — LACTATED RINGERS IV BOLUS (SEPSIS)
1000.0000 mL | Freq: Once | INTRAVENOUS | Status: AC
  Administered 2013-04-13: 1000 mL via INTRAVENOUS

## 2013-04-13 MED ORDER — GLYBURIDE 5 MG PO TABS
10.0000 mg | ORAL_TABLET | Freq: Two times a day (BID) | ORAL | Status: DC
  Administered 2013-04-13: 10 mg via ORAL
  Filled 2013-04-13 (×2): qty 2

## 2013-04-13 MED ORDER — ATORVASTATIN CALCIUM 10 MG PO TABS
10.0000 mg | ORAL_TABLET | Freq: Every day | ORAL | Status: DC
  Administered 2013-04-13: 10 mg via ORAL
  Filled 2013-04-13 (×2): qty 1

## 2013-04-13 MED ORDER — ALPRAZOLAM 0.25 MG PO TABS: 0.2500 mg | ORAL_TABLET | Freq: Every evening | ORAL | Status: DC | PRN

## 2013-04-13 MED ORDER — SODIUM CHLORIDE 0.9 % IR SOLN
Status: DC | PRN
  Administered 2013-04-13: 1000 mL
  Administered 2013-04-13: 3000 mL

## 2013-04-13 MED ORDER — IOHEXOL 300 MG/ML  SOLN
100.0000 mL | Freq: Once | INTRAMUSCULAR | Status: AC | PRN
  Administered 2013-04-13: 100 mL via INTRAVENOUS

## 2013-04-13 MED ORDER — GLYCOPYRROLATE 0.2 MG/ML IJ SOLN
INTRAMUSCULAR | Status: AC
  Filled 2013-04-13: qty 3

## 2013-04-13 MED ORDER — MORPHINE SULFATE 4 MG/ML IJ SOLN
1.0000 mg | INTRAMUSCULAR | Status: DC | PRN
  Administered 2013-04-13: 2 mg via INTRAVENOUS
  Administered 2013-04-13: 1 mg via INTRAVENOUS
  Administered 2013-04-14: 4 mg via INTRAVENOUS
  Filled 2013-04-13 (×3): qty 1

## 2013-04-13 SURGICAL SUPPLY — 45 items
APL SKNCLS STERI-STRIP NONHPOA (GAUZE/BANDAGES/DRESSINGS) ×1
BAG HAMPER (MISCELLANEOUS) ×2 IMPLANT
BAG SPEC RTRVL LRG 6X4 10 (ENDOMECHANICALS) ×1
BENZOIN TINCTURE PRP APPL 2/3 (GAUZE/BANDAGES/DRESSINGS) ×2 IMPLANT
CLOTH BEACON ORANGE TIMEOUT ST (SAFETY) ×2 IMPLANT
COVER LIGHT HANDLE STERIS (MISCELLANEOUS) ×4 IMPLANT
CUTTER LINEAR ENDO 35 ETS (STAPLE) ×1 IMPLANT
DECANTER SPIKE VIAL GLASS SM (MISCELLANEOUS) ×2 IMPLANT
DEVICE TROCAR PUNCTURE CLOSURE (ENDOMECHANICALS) ×2 IMPLANT
DURAPREP 26ML APPLICATOR (WOUND CARE) ×2 IMPLANT
ELECT REM PT RETURN 9FT ADLT (ELECTROSURGICAL) ×2
ELECTRODE REM PT RTRN 9FT ADLT (ELECTROSURGICAL) ×1 IMPLANT
FILTER SMOKE EVAC LAPAROSHD (FILTER) ×2 IMPLANT
FORMALIN 10 PREFIL 120ML (MISCELLANEOUS) ×2 IMPLANT
GLOVE BIOGEL PI IND STRL 7.0 (GLOVE) IMPLANT
GLOVE BIOGEL PI IND STRL 7.5 (GLOVE) ×1 IMPLANT
GLOVE BIOGEL PI INDICATOR 7.0 (GLOVE) ×2
GLOVE BIOGEL PI INDICATOR 7.5 (GLOVE) ×1
GLOVE ECLIPSE 6.5 STRL STRAW (GLOVE) ×1 IMPLANT
GLOVE ECLIPSE 7.0 STRL STRAW (GLOVE) ×2 IMPLANT
GLOVE EXAM NITRILE LRG STRL (GLOVE) ×1 IMPLANT
GOWN STRL REIN XL XLG (GOWN DISPOSABLE) ×5 IMPLANT
INST SET LAPROSCOPIC AP (KITS) ×2 IMPLANT
IV NS IRRIG 3000ML ARTHROMATIC (IV SOLUTION) ×1 IMPLANT
KIT ROOM TURNOVER APOR (KITS) ×2 IMPLANT
MANIFOLD NEPTUNE II (INSTRUMENTS) ×2 IMPLANT
NDL INSUFFLATION 14GA 120MM (NEEDLE) ×1 IMPLANT
NEEDLE INSUFFLATION 14GA 120MM (NEEDLE) ×2 IMPLANT
NS IRRIG 1000ML POUR BTL (IV SOLUTION) ×2 IMPLANT
PACK LAP CHOLE LZT030E (CUSTOM PROCEDURE TRAY) ×2 IMPLANT
PAD ARMBOARD 7.5X6 YLW CONV (MISCELLANEOUS) ×2 IMPLANT
POUCH SPECIMEN RETRIEVAL 10MM (ENDOMECHANICALS) ×2 IMPLANT
SEALER TISSUE G2 CVD JAW 35 (ENDOMECHANICALS) ×1 IMPLANT
SEALER TISSUE G2 CVD JAW 45CM (ENDOMECHANICALS) ×1
SET BASIN LINEN APH (SET/KITS/TRAYS/PACK) ×2 IMPLANT
SET TUBE IRRIG SUCTION NO TIP (IRRIGATION / IRRIGATOR) ×1 IMPLANT
STRIP CLOSURE SKIN 1/2X4 (GAUZE/BANDAGES/DRESSINGS) ×2 IMPLANT
SUT MNCRL AB 4-0 PS2 18 (SUTURE) ×2 IMPLANT
SUT VIC AB 2-0 CT2 27 (SUTURE) ×3 IMPLANT
TRAY FOLEY CATH 14FR (SET/KITS/TRAYS/PACK) ×2 IMPLANT
TROCAR Z-THAD FIOS HNDL 12X100 (TROCAR) ×2 IMPLANT
TROCAR Z-THRD FIOS HNDL 11X100 (TROCAR) ×2 IMPLANT
TROCAR Z-THREAD FIOS 5X100MM (TROCAR) ×2 IMPLANT
TUBING INSUFFLATION (TUBING) ×1 IMPLANT
WARMER LAPAROSCOPE (MISCELLANEOUS) ×2 IMPLANT

## 2013-04-13 NOTE — Anesthesia Postprocedure Evaluation (Signed)
  Anesthesia Post-op Note  Patient: [name]  Procedure(s) Performed: Procedure(s): APPENDECTOMY LAPAROSCOPIC (N/A)  Patient Location: PACU  Anesthesia Type:General  Level of Consciousness: awake, alert , oriented and patient cooperative  Airway and Oxygen Therapy: Patient Spontanous Breathing and Patient connected to face mask oxygen  Post-op Pain: none  Post-op Assessment: Post-op Vital signs reviewed, Patient's Cardiovascular Status Stable, Respiratory Function Stable, Patent Airway, No signs of Nausea or vomiting and Pain level controlled  Post-op Vital Signs: Reviewed and stable  Complications: No apparent anesthesia complications

## 2013-04-13 NOTE — Progress Notes (Signed)
Noted that patient has only put out 25cc of urine since surgery.  Bladder scanned the patient resulted in 150cc.  MD notified orders received and carried out. Will continue to monitor.

## 2013-04-13 NOTE — ED Notes (Signed)
Instructed patient to remain NPO until further orders received - acknowledges she understands.

## 2013-04-13 NOTE — H&P (Signed)
[name] is an 79 y.o. female.   Chief Complaint: Right lower quadrant pain HPI: Patient presented to [facility] emergency department with approximately 3-4 days of increasing right lower quadrant abdominal pain. Pain started insidiously. It has persisted in the right lower quadrant with no significant radiation. It is worse with palpation and movement. She has had associated nausea but no emesis. No change in bowel movements. No melena or hematochezia. No similar symptomatology in the past. No recent exposures. Note unusual travel. No new food exposure.  She has noted darkening of the urine. She has had some dysuria  Past Medical History  Diagnosis Date  . Hemorrhage of gastrointestinal tract, unspecified   . Other malaise and fatigue   . Tobacco use disorder   . Parotid tumor   . Neoplasm of malignant     breast  ,HX   . Diabetes mellitus   . Osteopenia   . Hypertension   . Hyperlipidemia   . Depression   . Anxiety state, unspecified     Past Surgical History  Procedure Laterality Date  . Mastectomy  1994    left breast -related to breast cancer  . Vesicovaginal fistula closure w/ tah  1979  . Cholecystectomy  2008  . Hemorrhoid surgery  1960s    Family History  Problem Relation Age of Onset  . Heart failure Mother     CHF  . Cirrhosis Brother   . Lung cancer Father     brain mets  . Kidney cancer Mother   . Kidney cancer Sister    Social History:  reports that she has been smoking Cigarettes.  She has a 20 pack-year smoking history. She does not have any smokeless tobacco history on file. She reports that she does not drink alcohol or use illicit drugs.  Allergies:  Allergies  Allergen Reactions  . Penicillins     REACTION: Rash    Medications Prior to Admission  Medication Sig Dispense Refill  . ALPRAZolam (NIRAVAM) 0.25 MG dissolvable tablet Take 0.25 mg by mouth at bedtime as needed.        Marland Kitchen amLODipine-benazepril (LOTREL) 5-20 MG per capsule Take 1  capsule by mouth daily.        Marland Kitchen aspirin 325 MG tablet Take 325 mg by mouth daily.        Marland Kitchen atorvastatin (LIPITOR) 10 MG tablet Take 10 mg by mouth daily.        . calcium carbonate 200 MG capsule Take 250 mg by mouth 2 (two) times daily with a meal.        . cholecalciferol (VITAMIN D) 1000 UNITS tablet Take 1,000 Units by mouth daily.        Marland Kitchen esomeprazole (NEXIUM) 40 MG capsule Take 40 mg by mouth daily before breakfast.        . glyBURIDE-metformin (GLUCOVANCE) 5-500 MG per tablet Take 2 tablets by mouth 2 (two) times daily with a meal.        . indapamide (LOZOL) 1.25 MG tablet Take 1.25 mg by mouth every morning.        . insulin glargine (LANTUS) 100 UNIT/ML injection Inject 2-8 Units into the skin at bedtime. Based on what sugar readings are at bedtime.      . Multiple Vitamin (MULTIVITAMIN) capsule Take 1 capsule by mouth daily.        . Tiotropium Bromide Monohydrate (SPIRIVA HANDIHALER IN) Inhale into the lungs. AS DIRECTED  Results for orders placed during the hospital encounter of 04/13/13 (from the past 48 hour(s))  URINALYSIS, ROUTINE W REFLEX MICROSCOPIC     Status: Abnormal   Collection Time    04/13/13  1:22 AM      Result Value Range   Color, Urine YELLOW  YELLOW   APPearance CLEAR  CLEAR   Specific Gravity, Urine <1.005 (*) 1.005 - 1.030   pH 7.0  5.0 - 8.0   Glucose, UA 100 (*) NEGATIVE mg/dL   Hgb urine dipstick SMALL (*) NEGATIVE   Bilirubin Urine NEGATIVE  NEGATIVE   Ketones, ur NEGATIVE  NEGATIVE mg/dL   Protein, ur NEGATIVE  NEGATIVE mg/dL   Urobilinogen, UA 0.2  0.0 - 1.0 mg/dL   Nitrite NEGATIVE  NEGATIVE   Leukocytes, UA MODERATE (*) NEGATIVE  URINE MICROSCOPIC-ADD ON     Status: Abnormal   Collection Time    04/13/13  1:22 AM      Result Value Range   Squamous Epithelial / LPF MANY (*) RARE   WBC, UA 3-6  <3 WBC/hpf   RBC / HPF 0-2  <3 RBC/hpf   Bacteria, UA MANY (*) RARE  CBC WITH DIFFERENTIAL     Status: Abnormal   Collection Time     04/13/13  1:28 AM      Result Value Range   WBC 13.6 (*) 4.0 - 10.5 K/uL   RBC 3.97  3.87 - 5.11 MIL/uL   Hemoglobin 13.3  12.0 - 15.0 g/dL   HCT 37.2  36.0 - 46.0 %   MCV 93.7  78.0 - 100.0 fL   MCH 33.5  26.0 - 34.0 pg   MCHC 35.8  30.0 - 36.0 g/dL   RDW 13.1  11.5 - 15.5 %   Platelets 285  150 - 400 K/uL   Neutrophils Relative % 76  43 - 77 %   Neutro Abs 10.3 (*) 1.7 - 7.7 K/uL   Lymphocytes Relative 16  12 - 46 %   Lymphs Abs 2.2  0.7 - 4.0 K/uL   Monocytes Relative 7  3 - 12 %   Monocytes Absolute 1.0  0.1 - 1.0 K/uL   Eosinophils Relative 1  0 - 5 %   Eosinophils Absolute 0.1  0.0 - 0.7 K/uL   Basophils Relative 0  0 - 1 %   Basophils Absolute 0.0  0.0 - 0.1 K/uL  COMPREHENSIVE METABOLIC PANEL     Status: Abnormal   Collection Time    04/13/13  1:28 AM      Result Value Range   Sodium 128 (*) 135 - 145 mEq/L   Potassium 3.0 (*) 3.5 - 5.1 mEq/L   Chloride 88 (*) 96 - 112 mEq/L   CO2 27  19 - 32 mEq/L   Glucose, Bld 204 (*) 70 - 99 mg/dL   BUN 10  6 - 23 mg/dL   Creatinine, Ser 0.45 (*) 0.50 - 1.10 mg/dL   Calcium 9.2  8.4 - 10.5 mg/dL   Total Protein 7.8  6.0 - 8.3 g/dL   Albumin 3.7  3.5 - 5.2 g/dL   AST 16  0 - 37 U/L   ALT 15  0 - 35 U/L   Alkaline Phosphatase 142 (*) 39 - 117 U/L   Total Bilirubin 1.0  0.3 - 1.2 mg/dL   GFR calc non Af Amer >90  >90 mL/min   GFR calc Af Amer >90  >90 mL/min   Comment:  The eGFR has been calculated     using the CKD EPI equation.     This calculation has not been     validated in all clinical     situations.     eGFR's persistently     <90 mL/min signify     possible Chronic Kidney Disease.  LIPASE, BLOOD     Status: None   Collection Time    04/13/13  1:28 AM      Result Value Range   Lipase 20  11 - 59 U/L  TROPONIN I     Status: None   Collection Time    04/13/13  1:28 AM      Result Value Range   Troponin I <0.30  <0.30 ng/mL   Comment:            Due to the release kinetics of cTnI,     a negative  result within the first hours     of the onset of symptoms does not rule out     myocardial infarction with certainty.     If myocardial infarction is still suspected,     repeat the test at appropriate intervals.   Ct Abdomen Pelvis W Contrast  04/13/2013   *RADIOLOGY REPORT*  Clinical Data: Abdominal pain, tenderness and bloating.  CT ABDOMEN AND PELVIS WITH CONTRAST  Technique:  Multidetector CT imaging of the abdomen and pelvis was performed following the standard protocol during bolus administration of intravenous contrast.  Contrast: 100 mL of Omnipaque 300 IV contrast  Comparison: Abdominal ultrasound performed 10/04/2006  Findings: Mild bibasilar atelectasis or scarring is noted.  A tiny hiatal hernia is seen.  The liver and spleen are unremarkable in appearance.  The patient is status post cholecystectomy, with clips noted along the gallbladder fossa.  There is marked atrophy of much of the body and tail of the pancreas, with associated prominence of the residual pancreatic duct.  The head and proximal body of the pancreas are grossly unremarkable in appearance.  A 1.3 cm hypodensity at the interpole region of the right kidney is nonspecific; this could be further assessed on renal ultrasound. Scattered small left renal cysts are seen.  Minimal nonspecific soft tissue stranding is noted about the upper pole of the left kidney.  There is no evidence of hydronephrosis.  No renal or ureteral stones are seen.  The small bowel is unremarkable in appearance.  The stomach is within normal limits.  No acute vascular abnormalities are seen.  A long appendix is visualized, tracking toward the midline.  The proximal appendix is unremarkable in appearance.  There is dilatation of the distal appendix to 1.0 cm, with wall enhancement and significant surrounding soft tissue inflammation, superior to the bladder.  Findings are compatible with acute tip appendicitis; a small amount of associated free fluid is seen,  and there is corresponding wall thickening along the dome of the bladder.  There is also soft tissue inflammation along the adjacent sigmoid colon.  There is no definite evidence of perforation or abscess formation at this time.  The bladder is moderately distended and otherwise grossly unremarkable in appearance.  Free fluid from the appendiceal process tracks into the pelvic cul-de-sac, with mild associated soft tissue inflammation.  The patient is status post hysterectomy. No suspicious adnexal masses are seen.  No inguinal lymphadenopathy is seen.  No acute osseous abnormalities are identified.  IMPRESSION:  1.  Dilatation of the distal appendix to 1.0 cm, with wall enhancement and  significant surrounding soft tissue inflammation, just superior to the bladder.  Findings compatible with acute tip appendicitis.  Small amount of associated free fluid noted tracking into the pelvis.  Corresponding reactive wall thickening involving the dome of the bladder and adjacent sigmoid colon.  No evidence of perforation or abscess formation at this time. 2.  1.3 cm hypodensity at the interpole region of the right kidney; this is nonspecific, and could be further assessed on renal ultrasound on an elective non-emergent basis, as deemed clinically appropriate. 3.  Scattered small left renal cysts seen. 4.  Tiny hiatal hernia noted. 5.  Marked atrophy of much of the body and tail of the pancreas, with associated dilatation of the distal pancreatic duct. 6.  Mild bibasilar atelectasis or scarring noted.  These results were called by telephone on 04/13/2013 at 05:22 a.m. to Dr. Daleen Bo, who verbally acknowledged these results.   Original Report Authenticated By: Santa Lighter, M.D.    Review of Systems  Constitutional: Positive for chills and malaise/fatigue.  HENT: Negative.   Eyes: Negative.   Cardiovascular: Negative.   Gastrointestinal: Positive for nausea and abdominal pain. Negative for vomiting, diarrhea  (right lower quadrant), constipation, blood in stool and melena.  Genitourinary: Positive for dysuria and hematuria.  Musculoskeletal: Negative.   Skin: Negative.   Neurological: Negative.   Endo/Heme/Allergies: Negative.   Psychiatric/Behavioral: Negative.     Blood pressure 138/62, pulse 91, temperature 100.8 F (38.2 C), temperature source Oral, resp. rate 20, height _0  (1.626 m), weight 67.132 kg (148 lb), SpO2 91.00%. Physical Exam  Constitutional: She is oriented to person, place, and time. She appears well-developed and well-nourished. No distress.  Thin, elderly  HENT:  Head: Normocephalic and atraumatic.  Eyes: Conjunctivae and EOM are normal. Pupils are equal, round, and reactive to light. No scleral icterus.  Neck: Normal range of motion. Neck supple. No tracheal deviation present. No thyromegaly present.  Cardiovascular: Normal rate, regular rhythm and normal heart sounds.   Respiratory: Effort normal and breath sounds normal. No respiratory distress.  GI: Soft. She exhibits no distension and no mass. There is tenderness (right lower quadrant. Positive pain at McBurney's point. No diffuse peritoneal signs.). There is rebound. There is no guarding.  Lymphadenopathy:    She has no cervical adenopathy.  Neurological: She is alert and oriented to person, place, and time.  Skin: Skin is warm and dry.     Assessment/Plan Acute appendicitis. Physical exam findings and radiologic findings were discussed with the patient and family. At this time there is no evidence or suspicion for ruptured appendicitis however given the length of time of her pain this is a possibility. Both laparoscopic and open approaches have been discussed with the family and patient. Risks benefits and alternatives have been discussed at length including but not limited to risk of bleeding, infection, appendiceal stump leak, intraoperative cardiac and pulmonary events. At this point patient will be continued  in an n.p.o. status. Continue IV fluid hydration. Continued on DVT prophylaxis. As patient has a penicillin allergy she'll be continued on a fluoroquinolone and metronidazole. We will proceed to the operating room for an emergent laparoscopic possible open appendectomy as discussed and consented for.  Geanine Vandekamp C 04/13/2013, 9:46 AM

## 2013-04-13 NOTE — Transfer of Care (Signed)
Immediate Anesthesia Transfer of Care Note  Patient: [name]  Procedure(s) Performed: Procedure(s): APPENDECTOMY LAPAROSCOPIC (N/A)  Patient Location: PACU  Anesthesia Type:General  Level of Consciousness: awake, alert , oriented and patient cooperative  Airway & Oxygen Therapy: Patient Spontanous Breathing and Patient connected to face mask oxygen  Post-op Assessment: Report given to PACU RN and Post -op Vital signs reviewed and stable  Post vital signs: Reviewed and stable  Complications: No apparent anesthesia complications

## 2013-04-13 NOTE — ED Provider Notes (Addendum)
History     CSN: [id number]  Arrival date & time 04/12/13  2340   First MD Initiated Contact with Patient 04/13/13 0127      Chief Complaint  Patient presents with  . Abdominal Pain    (Consider location/radiation/quality/duration/timing/severity/associated sxs/prior treatment) HPI Comments: [name] is a 79 y.o. female who states that she has had abdominal cramping, and anorexia, for 3 days. She has nausea without vomiting. She denies diarrhea or constipation. She took insulin today because her sugar was 300. She only uses insulin sporadically. She has not had any recent changes in her medication. She has urinary frequency, without dysuria. No cough, chest pain, back pain or dizziness. No other modifying factors.  Patient is a 79 y.o. female presenting with abdominal pain. The history is provided by the patient.  Abdominal Pain Associated symptoms include abdominal pain.    Past Medical History  Diagnosis Date  . Hemorrhage of gastrointestinal tract, unspecified   . Other malaise and fatigue   . Tobacco use disorder   . Parotid tumor   . Neoplasm of malignant     breast  ,HX   . Diabetes mellitus   . Osteopenia   . Hypertension   . Hyperlipidemia   . Depression   . Anxiety state, unspecified     Past Surgical History  Procedure Laterality Date  . Mastectomy  1994    left breast -related to breast cancer  . Vesicovaginal fistula closure w/ tah  1979  . Cholecystectomy  2008  . Hemorrhoid surgery  1960s    Family History  Problem Relation Age of Onset  . Heart failure Mother     CHF  . Cirrhosis Brother   . Lung cancer Father     brain mets  . Kidney cancer Mother   . Kidney cancer Sister     History  Substance Use Topics  . Smoking status: Current Every Day Smoker -- 0.50 packs/day for 40 years    Types: Cigarettes  . Smokeless tobacco: Not on file  . Alcohol Use: No    OB History   Grav Para Term Preterm Abortions TAB SAB Ect Mult Living               Review of Systems  Gastrointestinal: Positive for abdominal pain.  All other systems reviewed and are negative.    Allergies  Penicillins  Home Medications   Current Outpatient Rx  Name  Route  Sig  Dispense  Refill  . ALPRAZolam (NIRAVAM) 0.25 MG dissolvable tablet   Oral   Take 0.25 mg by mouth at bedtime as needed.           Marland Kitchen amLODipine-benazepril (LOTREL) 5-20 MG per capsule   Oral   Take 1 capsule by mouth daily.           Marland Kitchen aspirin 325 MG tablet   Oral   Take 325 mg by mouth daily.           Marland Kitchen atorvastatin (LIPITOR) 10 MG tablet   Oral   Take 10 mg by mouth daily.           . calcium carbonate 200 MG capsule   Oral   Take 250 mg by mouth 2 (two) times daily with a meal.           . cholecalciferol (VITAMIN D) 1000 UNITS tablet   Oral   Take 1,000 Units by mouth daily.           Marland Kitchen  esomeprazole (NEXIUM) 40 MG capsule   Oral   Take 40 mg by mouth daily before breakfast.           . glyBURIDE-metformin (GLUCOVANCE) 5-500 MG per tablet   Oral   Take 2 tablets by mouth 2 (two) times daily with a meal.           . indapamide (LOZOL) 1.25 MG tablet   Oral   Take 1.25 mg by mouth every morning.           . Multiple Vitamin (MULTIVITAMIN) capsule   Oral   Take 1 capsule by mouth daily.           . NON FORMULARY      LANTUS FLEXI PEN  AS DIRECTED          . Tiotropium Bromide Monohydrate (SPIRIVA HANDIHALER IN)   Inhalation   Inhale into the lungs. AS DIRECTED            BP 148/58  Pulse 93  Temp(Src) 100 F (37.8 C) (Oral)  Resp 20  Ht _0  (1.626 m)  Wt 148 lb (67.132 kg)  BMI 25.39 kg/m2  SpO2 96%  Physical Exam  Nursing note and vitals reviewed. Constitutional: She is oriented to person, place, and time. She appears well-developed and well-nourished.  HENT:  Head: Normocephalic and atraumatic.  Eyes: Conjunctivae and EOM are normal. Pupils are equal, round, and reactive to light.  Neck: Normal  range of motion and phonation normal. Neck supple.  Cardiovascular: Normal rate, regular rhythm and intact distal pulses.   Pulmonary/Chest: Effort normal and breath sounds normal. She exhibits no tenderness.  Abdominal: Soft. She exhibits distension. There is tenderness (difuse, mild). There is no guarding.  Musculoskeletal: Normal range of motion.  Neurological: She is alert and oriented to person, place, and time. She has normal strength. She exhibits normal muscle tone.  Skin: Skin is warm and dry.  Psychiatric: She has a normal mood and affect. Her behavior is normal. Judgment and thought content normal.    ED Course  Procedures (including critical care time)  Medications  0.9 %  sodium chloride infusion ( Intravenous Stopped 04/13/13 0446)  levofloxacin (LEVAQUIN) IVPB 500 mg (not administered)  metroNIDAZOLE (FLAGYL) IVPB 500 mg (not administered)  sodium chloride 0.9 % bolus 250 mL (0 mLs Intravenous Stopped 04/13/13 0332)  ondansetron (ZOFRAN) injection 4 mg (4 mg Intravenous Given 04/13/13 0218)  iohexol (OMNIPAQUE) 300 MG/ML solution 50 mL (50 mLs Oral Contrast Given 04/13/13 0330)  iohexol (OMNIPAQUE) 300 MG/ML solution 100 mL (100 mLs Intravenous Contrast Given 04/13/13 0332)    Patient Vitals for the past 24 hrs:  BP Temp Temp src Pulse Resp SpO2 Height Weight  04/13/13 0356 148/58 mmHg - - 93 20 96 % - -  04/12/13 2350 95/61 mmHg 100 F (37.8 C) Oral 99 18 95 % _1  (1.626 m) 148 lb (67.132 kg)    5:40 AM-Consult complete with Dr. Radene Knee (Radiology). Patient case explained and discussed. He has dx a complicated right lung CA and LLL PNE.  Call ended at 05:44   5:54 AM-Consult complete with Geroge Baseman. Patient case explained and discussed. He agrees to admit patient for further evaluation and treatment. Call ended at 06:05     Labs Reviewed  URINALYSIS, [place] - Abnormal; Notable for the following:    Specific Gravity, Urine <1.005 (*)    Glucose,  UA 100 (*)    Hgb urine dipstick SMALL (*)  Leukocytes, UA MODERATE (*)    All other components within normal limits  CBC WITH DIFFERENTIAL - Abnormal; Notable for the following:    WBC 13.6 (*)    Neutro Abs 10.3 (*)    All other components within normal limits  COMPREHENSIVE METABOLIC PANEL - Abnormal; Notable for the following:    Sodium 128 (*)    Potassium 3.0 (*)    Chloride 88 (*)    Glucose, Bld 204 (*)    Creatinine, Ser 0.45 (*)    Alkaline Phosphatase 142 (*)    All other components within normal limits  URINE MICROSCOPIC-ADD ON - Abnormal; Notable for the following:    Squamous Epithelial / LPF MANY (*)    Bacteria, UA MANY (*)    All other components within normal limits  URINE CULTURE  LIPASE, BLOOD  TROPONIN I     Date: 04/13/13  Rate: 87  Rhythm: normal sinus rhythm  QRS Axis: normal  PR and QT Intervals: normal  ST/T Wave abnormalities: nonspecific ST changes  PR and QRS Conduction Disutrbances:Incomplete right bundle-branch block  Narrative Interpretation:   Old EKG Reviewed: unchanged- 03/29/10    Ct Abdomen Pelvis W Contrast  04/13/2013   *RADIOLOGY REPORT*  Clinical Data: Abdominal pain, tenderness and bloating.  CT ABDOMEN AND PELVIS WITH CONTRAST  Technique:  Multidetector CT imaging of the abdomen and pelvis was performed following the standard protocol during bolus administration of intravenous contrast.  Contrast: 100 mL of Omnipaque 300 IV contrast  Comparison: Abdominal ultrasound performed 10/04/2006  Findings: Mild bibasilar atelectasis or scarring is noted.  A tiny hiatal hernia is seen.  The liver and spleen are unremarkable in appearance.  The patient is status post cholecystectomy, with clips noted along the gallbladder fossa.  There is marked atrophy of much of the body and tail of the pancreas, with associated prominence of the residual pancreatic duct.  The head and proximal body of the pancreas are grossly unremarkable in appearance.  A 1.3  cm hypodensity at the interpole region of the right kidney is nonspecific; this could be further assessed on renal ultrasound. Scattered small left renal cysts are seen.  Minimal nonspecific soft tissue stranding is noted about the upper pole of the left kidney.  There is no evidence of hydronephrosis.  No renal or ureteral stones are seen.  The small bowel is unremarkable in appearance.  The stomach is within normal limits.  No acute vascular abnormalities are seen.  A long appendix is visualized, tracking toward the midline.  The proximal appendix is unremarkable in appearance.  There is dilatation of the distal appendix to 1.0 cm, with wall enhancement and significant surrounding soft tissue inflammation, superior to the bladder.  Findings are compatible with acute tip appendicitis; a small amount of associated free fluid is seen, and there is corresponding wall thickening along the dome of the bladder.  There is also soft tissue inflammation along the adjacent sigmoid colon.  There is no definite evidence of perforation or abscess formation at this time.  The bladder is moderately distended and otherwise grossly unremarkable in appearance.  Free fluid from the appendiceal process tracks into the pelvic cul-de-sac, with mild associated soft tissue inflammation.  The patient is status post hysterectomy. No suspicious adnexal masses are seen.  No inguinal lymphadenopathy is seen.  No acute osseous abnormalities are identified.  IMPRESSION:  1.  Dilatation of the distal appendix to 1.0 cm, with wall enhancement and significant surrounding soft tissue inflammation, just  superior to the bladder.  Findings compatible with acute tip appendicitis.  Small amount of associated free fluid noted tracking into the pelvis.  Corresponding reactive wall thickening involving the dome of the bladder and adjacent sigmoid colon.  No evidence of perforation or abscess formation at this time. 2.  1.3 cm hypodensity at the interpole  region of the right kidney; this is nonspecific, and could be further assessed on renal ultrasound on an elective non-emergent basis, as deemed clinically appropriate. 3.  Scattered small left renal cysts seen. 4.  Tiny hiatal hernia noted. 5.  Marked atrophy of much of the body and tail of the pancreas, with associated dilatation of the distal pancreatic duct. 6.  Mild bibasilar atelectasis or scarring noted.  These results were called by telephone on 04/13/2013 at 05:22 a.m. to Dr. Daleen Bo, who verbally acknowledged these results.   Original Report Authenticated By: Santa Lighter, M.D.     1. Appendicitis   2. Hypokalemia       MDM  Abdominal pain with appendicitis. Her urinary symptoms are likely related to presence of the appendicitis at the dome of the bladder. Urinalysis is not clearly evident for UTI. She has mild hypokalemia. She'll need to be admitted for management and operative repair.  Plan: Admit to surgery       Richarda Blade, MD 04/13/13 [place], MD 04/13/13 [phone]

## 2013-04-13 NOTE — ED Notes (Signed)
MD at bedside.(Dr Eulis Foster talking with patient and her daughter

## 2013-04-13 NOTE — ED Notes (Signed)
Patient's daughter to nurse's station. States she has discussed care of patient with siblings and they want patient to be transferred to [facility]. Family informed that her care could be done here, but they are insistent that she be transferred. Dr Benn Moulder paged and RN will inform him of family request.

## 2013-04-13 NOTE — ED Notes (Signed)
Completed oral contrast both bottles

## 2013-04-13 NOTE — Preoperative (Signed)
Beta Blockers   Reason not to administer Beta Blockers:Not Applicable 

## 2013-04-13 NOTE — ED Notes (Signed)
Advised of need for clean catch urine if she feels the need to void.  Procedure explained to patient and her daughter.

## 2013-04-13 NOTE — Anesthesia Procedure Notes (Signed)
Procedure Name: Intubation Date/Time: 04/13/2013 10:53 AM Performed by: Antony Contras, AMY L Pre-anesthesia Checklist: Patient identified, Patient being monitored, Timeout performed, Emergency Drugs available and Suction available Patient Re-evaluated:Patient Re-evaluated prior to inductionOxygen Delivery Method: Circle System Utilized Preoxygenation: Pre-oxygenation with 100% oxygen Intubation Type: IV induction Ventilation: Mask ventilation without difficulty Laryngoscope Size: 3 and Miller Grade View: Grade I Tube type: Oral Tube size: 7.0 mm Number of attempts: 1 Airway Equipment and Method: stylet Placement Confirmation: ETT inserted through vocal cords under direct vision,  positive ETCO2 and breath sounds checked- equal and bilateral Secured at: 21 cm Tube secured with: Tape Dental Injury: Teeth and Oropharynx as per pre-operative assessment

## 2013-04-13 NOTE — ED Notes (Signed)
Dr Eulis Foster notified of patient temperature. 650 mg tylenol ordered per Dr Eulis Foster with small sip of water. Dr Eulis Foster also notified of pain. Orders obtained.

## 2013-04-13 NOTE — Progress Notes (Addendum)
Dr. Geroge Baseman returned page and gave orders for patient to receive Chloraseptic spray prn for dry, scratchy throat.  Also gave order for patient to receive Tylenol 650 mg po q 4 hours prn for temperature greater than 101.  Orders followed.

## 2013-04-13 NOTE — ED Notes (Signed)
Report given to Ivin Booty, RN unit 200. Ready to receive patient.

## 2013-04-13 NOTE — ED Notes (Signed)
Returned from CT with most of IV fluids infused.  IV saline locked

## 2013-04-13 NOTE — Anesthesia Preprocedure Evaluation (Addendum)
Anesthesia Evaluation  Patient identified by MRN, date of birth, ID band Patient awake    Reviewed: Allergy & Precautions, H&P , NPO status , Patient's Chart, lab work & pertinent test results  Airway Mallampati: II TM Distance: >3 FB Neck ROM: Full    Dental  (+) Edentulous Upper and Partial Lower   Pulmonary shortness of breath, COPD (emphysema on chest CT)Current Smoker,  breath sounds clear to auscultation        Cardiovascular hypertension, Pt. on medications + CAD (echo 02/2013 - 65%EF, nl wall Fx) Rhythm:Regular Rate:Normal     Neuro/Psych PSYCHIATRIC DISORDERS Anxiety Depression    GI/Hepatic   Endo/Other  diabetes, Type 2, Insulin Dependent  Renal/GU      Musculoskeletal   Abdominal   Peds  Hematology   Anesthesia Other Findings   Reproductive/Obstetrics                          Anesthesia Physical Anesthesia Plan  ASA: III  Anesthesia Plan: General   Post-op Pain Management:    Induction: Intravenous, Rapid sequence and Cricoid pressure planned  Airway Management Planned: Oral ETT  Additional Equipment:   Intra-op Plan:   Post-operative Plan: Extubation in OR  Informed Consent: I have reviewed the patients History and Physical, chart, labs and discussed the procedure including the risks, benefits and alternatives for the proposed anesthesia with the patient or authorized representative who has indicated his/her understanding and acceptance.     Plan Discussed with:   Anesthesia Plan Comments: (amidate induction)        Anesthesia Quick Evaluation

## 2013-04-13 NOTE — ED Notes (Signed)
Advised to begin sipping on contrast slowly until nausea subsides

## 2013-04-13 NOTE — Progress Notes (Signed)
UR complete.  Lorne Skeens RN, MSN

## 2013-04-13 NOTE — Op Note (Signed)
Patient:  [name]  DOB:  1931-09-05  MRN:  [id number]   Preop Diagnosis:  Acute appendicitis  Postop Diagnosis:  The same  Procedure:  Laparoscopic appendectomy  Surgeon:  Dr. Chelsea Primus  Anes:  General endotracheal, 0.5% Sensorcaine plain for local  Indications:  Patient is an 79 year old female presented to [facility] emergency department with right lower quadrant abdominal pain. Workup and evaluation was consistent for acute appendicitis. Risks benefits and alternatives of a laparoscopic possible open appendectomy were discussed at length the patient including but not limited to risk of bleeding, infection, appendiceal stump leak, intraoperative cardiac and pulmonary events. Patient's questions and concerns are addressed the patient as consented for the planned procedure.  Procedure note:  Patient is taken to the operating room was placed in supine position on the or table time the general anesthetic is administered. Once patient was asleep she was endotracheally intubated by the nurse anesthetist. At this point a Foley catheter is placed in standard sterile fashion by the operative staff. Her abdomen is prepped with DuraPrep solution and draped in standard fashion. Time out was performed. Stab incision was created supraumbilically with 11 blade scalpel with additional dissection down to subcuticular tissue carried out using a Coker clamp was utilized grasp the anterior normal fascia and lift his anteriorly. A Veress he was inserted saline drop test is utilized confirm intraperitoneal placement. At this time pneumoperitoneum was initiated and once sufficient pneumoperitoneum was obtained an 12 millimeter trochars inserted over a laparoscope allowed visualization of the trocar entering into the peritoneal cavity. At this point the inner cannulas removed lap scope was reinserted there is no evidence of any trocar or Veress needle placement injury. At this time the remaining  trochars replaced a 5 trocar in the suprapubic region an 11 mm trocar in the left lateral abdominal wall. Patient's placed into a Trendelenburg left lateral decubitus position. The tinea the cecum or fall down to the base of the appendix. The appendix is identified and is noted to be coursing behind the bladder into the pelvis. It is carefully dissected free. The tip at the appendix is clearly involves with serosa Elta Gu[place] a cyst noted. There is no evidence of any gross contamination however due to the appearance of the op 2 ring the suction-irrigator to the field. I continued dissection by creating a window at the base the appendix between the appendix and mesoappendix. The base of the appendix is divided using a 35 Endo GIA stapler. The mesoappendix is divided using the Enseal bipolar device. At this point the appendix is free and placed into an Endo Catch bag which is placed into the right upper quadrant. At this time I did copiously irrigate the surgical field with warm sterile saline. The returning aspirate was clear. The mesoappendix and the appendiceal stump were inspected there is no evidence a bleeding. The appendiceal stump is well approximated. At this time her my attention to closure.  Using an Endo Close suture passing device a 2-0 Vicryl sutures passed both the 12 and 11 mm trocar sites. With the sutures in place the appendix is retrieved was removed through the umbilical trocar site and intact Endo Catch bag. It is placed in the back table and sent as a permanent specimen to pathology. At this time the pneumoperitoneum was evacuated. The Vicryl sutures were secured after the trochars were removed. The local anesthetic is instilled. A 4-0 Monocryl was utilized reapproximate the skin edges at all 3 trocar sites. The skin  was washed dried moist dry towel. Benzoin is applied around incision. Half-inch Steri-Strips are placed. The drapes removed. The patient left come out of general anesthetic and she is  transferred to the PACU in stable condition. At the conclusion of procedure all instrument, sponge, needle counts are correct. Patient tolerated procedure extremely well.  Complications:  None apparent  EBL:  Less than 50 ML  Specimen:  Appendix

## 2013-04-13 NOTE — Progress Notes (Signed)
Pt's temp 100.4 has no PRN Tylenol ordered.  Pt and pt's family requesting something for scratchy throat.  Dr. Geroge Baseman paged.

## 2013-04-13 NOTE — Plan of Care (Signed)
Problem: Consults Goal: General Medical Patient Education See Patient Education Module for specific education. Outcome: Completed/Met Date Met:  04/13/13 Educated pt regarding appendicitis and Dr. Chelsea Primus came and talked with and informed pt regarding the appendectomy and answered all of the pt's questions.

## 2013-04-14 ENCOUNTER — Encounter (HOSPITAL_COMMUNITY): Payer: Self-pay | Admitting: General Surgery

## 2013-04-14 DIAGNOSIS — E119 Type 2 diabetes mellitus without complications: Secondary | ICD-10-CM | POA: Diagnosis not present

## 2013-04-14 DIAGNOSIS — I10 Essential (primary) hypertension: Secondary | ICD-10-CM | POA: Diagnosis not present

## 2013-04-14 DIAGNOSIS — K3580 Unspecified acute appendicitis: Secondary | ICD-10-CM | POA: Diagnosis not present

## 2013-04-14 LAB — URINE CULTURE: Colony Count: 65000

## 2013-04-14 LAB — BASIC METABOLIC PANEL
BUN: 7 mg/dL (ref 6–23)
CO2: 29 mEq/L (ref 19–32)
Calcium: 8.6 mg/dL (ref 8.4–10.5)
Chloride: 91 mEq/L — ABNORMAL LOW (ref 96–112)
Creatinine, Ser: 0.51 mg/dL (ref 0.50–1.10)
GFR calc Af Amer: >90 mL/min (ref >90)
GFR calc non Af Amer: 88 mL/min — ABNORMAL LOW (ref >90)
Glucose, Bld: 202 mg/dL — ABNORMAL HIGH (ref 70–99)
Potassium: 3 mEq/L — ABNORMAL LOW (ref 3.5–5.1)
Sodium: 132 mEq/L — ABNORMAL LOW (ref 135–145)

## 2013-04-14 LAB — GLUCOSE, CAPILLARY
Glucose-Capillary: 197 mg/dL — ABNORMAL HIGH (ref 70–99)
Glucose-Capillary: 196 mg/dL — ABNORMAL HIGH (ref 70–99)

## 2013-04-14 MED ORDER — METRONIDAZOLE 500 MG PO TABS: 500.0000 mg | ORAL_TABLET | Freq: Three times a day (TID) | ORAL | Status: DC

## 2013-04-14 MED ORDER — GLYBURIDE 5 MG PO TABS
10.0000 mg | ORAL_TABLET | Freq: Two times a day (BID) | ORAL | Status: DC
  Administered 2013-04-14: 10 mg via ORAL

## 2013-04-14 MED ORDER — HYDROCODONE-ACETAMINOPHEN 5-325 MG PO TABS: 1.0000 | ORAL_TABLET | ORAL | Status: DC | PRN

## 2013-04-14 MED ORDER — METFORMIN HCL 500 MG PO TABS: 1000.0000 mg | ORAL_TABLET | Freq: Two times a day (BID) | ORAL | Status: DC

## 2013-04-14 MED ORDER — LEVOFLOXACIN 500 MG PO TABS: 500.0000 mg | ORAL_TABLET | Freq: Every day | ORAL | Status: DC

## 2013-04-14 NOTE — Anesthesia Postprocedure Evaluation (Signed)
  Anesthesia Post-op Note  Patient: [name]  Procedure(s) Performed: Procedure(s): APPENDECTOMY LAPAROSCOPIC (N/A)  Patient Location: Room 215  Anesthesia Type:General  Level of Consciousness: awake, alert , oriented and patient cooperative  Airway and Oxygen Therapy: Patient Spontanous Breathing  Post-op Pain: mild  Post-op Assessment: Post-op Vital signs reviewed, Patient's Cardiovascular Status Stable, Respiratory Function Stable, Patent Airway and No signs of Nausea or vomiting  Post-op Vital Signs: Reviewed and stable  Complications: No apparent anesthesia complications

## 2013-04-14 NOTE — Progress Notes (Signed)
Pt. Ambulated in hall on RA approximately 200 feet, no SOB, not passing flatus, no increase in pain.

## 2013-04-14 NOTE — Progress Notes (Signed)
Inpatient Diabetes Program Recommendations  AACE/ADA: New Consensus Statement on Inpatient Glycemic Control (2013)  Target Ranges:  Prepandial:   less than 140 mg/dL      Peak postprandial:   less than 180 mg/dL (1-2 hours)      Critically ill patients:  140 - 180 mg/dL   Results for MARKIA, KYER (MRN [id number]) as of 04/14/2013 08:48  Ref. Range 04/13/2013 10:08 04/13/2013 12:04 04/13/2013 17:25 04/13/2013 21:24 04/14/2013 07:48  Glucose-Capillary Latest Range: 70-99 mg/dL 167 (H) 183 (H) 260 (H) 295 (H) 197 (H)    Inpatient Diabetes Program Recommendations Correction (SSI): Please order CBGS with Novolog correction ACHS while inpatient. A1C: Please order an A1C to determine glycemic control over the past 2-3 months.  Note: Blood glucose over the past 24 hours has ranged from 167-295 mg/dl and fasting blood glucose this morning was 197 mg/dl.  Please consider ordering CBGs with Novolog correction and ordering an A1C.   Thanks, Barnie Alderman, RN, MSN, CCRN Diabetes Coordinator Inpatient Diabetes Program [phone]

## 2013-04-14 NOTE — Progress Notes (Signed)
Subjective: Patient was admitted and had appendectomy for acute appendicitis. She had also hypokalemia. Patient is doing better. No nausea, vomiting or abdominal pain.  Objective: Vital signs in last 24 hours: Temp:  [98.1 F (36.7 C)-100.4 F (38 C)] 98.5 F (36.9 C) (05/30 0456) Pulse Rate:  [77-113] 97 (05/30 0456) Resp:  [13-20] 18 (05/30 0456) BP: (113-168)/(45-72) 141/55 mmHg (05/30 0456) SpO2:  [90 %-100 %] 95 % (05/30 0456) Weight:  [67.132 kg (148 lb)] 67.132 kg (148 lb) (05/29 1007) Weight change: 0 kg (0 lb) Last BM Date: 04/10/13  Intake/Output from previous day: 05/29 0701 - 05/30 0700 In: 1390 [P.O.:290; I.V.:1100] Out: 1185 [Urine:1175; Blood:10]  PHYSICAL EXAM General appearance: alert and no distress Resp: clear to auscultation bilaterally Cardio: S1, S2 normal GI: abdomen full, incision sites clean, bowel sound hypoactive Extremities: extremities normal, atraumatic, no cyanosis or edema  Lab Results:    _0 @ ABGS No results found for this basename: PHART, PCO2, PO2ART, TCO2, HCO3,  in the last 72 hours CULTURES Recent Results (from the past 240 hour(s))  SURGICAL PCR SCREEN     Status: None   Collection Time    04/13/13  9:45 AM      Result Value Range Status   MRSA, PCR NEGATIVE  NEGATIVE Final   Staphylococcus aureus NEGATIVE  NEGATIVE Final   Comment:            The Xpert SA Assay (FDA     approved for NASAL specimens     in patients over 40 years of age),     is one component of     a comprehensive surveillance     program.  Test performance has     been validated by [employer] for patients greater     than or equal to 18 year old.     It is not intended     to diagnose infection nor to     guide or monitor treatment.   Studies/Results: Ct Abdomen Pelvis W Contrast  04/13/2013   *RADIOLOGY REPORT*  Clinical Data: Abdominal pain, tenderness and bloating.  CT ABDOMEN AND PELVIS WITH CONTRAST  Technique:  Multidetector CT  imaging of the abdomen and pelvis was performed following the standard protocol during bolus administration of intravenous contrast.  Contrast: 100 mL of Omnipaque 300 IV contrast  Comparison: Abdominal ultrasound performed 10/04/2006  Findings: Mild bibasilar atelectasis or scarring is noted.  A tiny hiatal hernia is seen.  The liver and spleen are unremarkable in appearance.  The patient is status post cholecystectomy, with clips noted along the gallbladder fossa.  There is marked atrophy of much of the body and tail of the pancreas, with associated prominence of the residual pancreatic duct.  The head and proximal body of the pancreas are grossly unremarkable in appearance.  A 1.3 cm hypodensity at the interpole region of the right kidney is nonspecific; this could be further assessed on renal ultrasound. Scattered small left renal cysts are seen.  Minimal nonspecific soft tissue stranding is noted about the upper pole of the left kidney.  There is no evidence of hydronephrosis.  No renal or ureteral stones are seen.  The small bowel is unremarkable in appearance.  The stomach is within normal limits.  No acute vascular abnormalities are seen.  A long appendix is visualized, tracking toward the midline.  The proximal appendix is unremarkable in appearance.  There is dilatation of the distal appendix to 1.0 cm, with  wall enhancement and significant surrounding soft tissue inflammation, superior to the bladder.  Findings are compatible with acute tip appendicitis; a small amount of associated free fluid is seen, and there is corresponding wall thickening along the dome of the bladder.  There is also soft tissue inflammation along the adjacent sigmoid colon.  There is no definite evidence of perforation or abscess formation at this time.  The bladder is moderately distended and otherwise grossly unremarkable in appearance.  Free fluid from the appendiceal process tracks into the pelvic cul-de-sac, with mild  associated soft tissue inflammation.  The patient is status post hysterectomy. No suspicious adnexal masses are seen.  No inguinal lymphadenopathy is seen.  No acute osseous abnormalities are identified.  IMPRESSION:  1.  Dilatation of the distal appendix to 1.0 cm, with wall enhancement and significant surrounding soft tissue inflammation, just superior to the bladder.  Findings compatible with acute tip appendicitis.  Small amount of associated free fluid noted tracking into the pelvis.  Corresponding reactive wall thickening involving the dome of the bladder and adjacent sigmoid colon.  No evidence of perforation or abscess formation at this time. 2.  1.3 cm hypodensity at the interpole region of the right kidney; this is nonspecific, and could be further assessed on renal ultrasound on an elective non-emergent basis, as deemed clinically appropriate. 3.  Scattered small left renal cysts seen. 4.  Tiny hiatal hernia noted. 5.  Marked atrophy of much of the body and tail of the pancreas, with associated dilatation of the distal pancreatic duct. 6.  Mild bibasilar atelectasis or scarring noted.  These results were called by telephone on 04/13/2013 at 05:22 a.m. to Dr. Daleen Bo, who verbally acknowledged these results.   Original Report Authenticated By: Santa Lighter, M.D.    Medications: I have reviewed the patient's current medications.  Assesment: 1. Acute appendicitis and s/p appendectomy 2. Hypokalemia 3.Hypertension 4. DM type II Active Problems:   * No active hospital problems. *    Plan: Medications reviewed Repeat BMP As per surgery plan.    LOS: 1 day   [name] 04/14/2013, 8:24 AM

## 2013-04-14 NOTE — Progress Notes (Signed)
Pt. D/c instructions provided to daughter and pt. Pt. Incisions appear to be without signs of infections, pt. Advised when to contact MD, discussed prescriptions, and IV removed.

## 2013-04-15 ENCOUNTER — Inpatient Hospital Stay (HOSPITAL_COMMUNITY)
Admission: EM | Admit: 2013-04-15 | Discharge: 2013-04-26 | DRG: 394 | Disposition: A | Discharge status: Home Health | Payer: Medicare Other | Attending: General Surgery | Admitting: General Surgery

## 2013-04-15 ENCOUNTER — Emergency Department (HOSPITAL_COMMUNITY): Payer: Medicare Other

## 2013-04-15 DIAGNOSIS — E119 Type 2 diabetes mellitus without complications: Secondary | ICD-10-CM | POA: Diagnosis present

## 2013-04-15 DIAGNOSIS — Z9010 Acquired absence of unspecified breast and nipple: Secondary | ICD-10-CM | POA: Diagnosis not present

## 2013-04-15 DIAGNOSIS — K929 Disease of digestive system, unspecified: Secondary | ICD-10-CM | POA: Diagnosis present

## 2013-04-15 DIAGNOSIS — Z8051 Family history of malignant neoplasm of kidney: Secondary | ICD-10-CM

## 2013-04-15 DIAGNOSIS — K2970 Gastritis, unspecified, without bleeding: Secondary | ICD-10-CM | POA: Diagnosis not present

## 2013-04-15 DIAGNOSIS — J984 Other disorders of lung: Secondary | ICD-10-CM | POA: Diagnosis not present

## 2013-04-15 DIAGNOSIS — E876 Hypokalemia: Secondary | ICD-10-CM | POA: Diagnosis present

## 2013-04-15 DIAGNOSIS — F411 Generalized anxiety disorder: Secondary | ICD-10-CM | POA: Diagnosis present

## 2013-04-15 DIAGNOSIS — K2990 Gastroduodenitis, unspecified, without bleeding: Secondary | ICD-10-CM | POA: Diagnosis not present

## 2013-04-15 DIAGNOSIS — Z794 Long term (current) use of insulin: Secondary | ICD-10-CM

## 2013-04-15 DIAGNOSIS — F329 Major depressive disorder, single episode, unspecified: Secondary | ICD-10-CM | POA: Diagnosis present

## 2013-04-15 DIAGNOSIS — I10 Essential (primary) hypertension: Secondary | ICD-10-CM | POA: Diagnosis present

## 2013-04-15 DIAGNOSIS — R109 Unspecified abdominal pain: Secondary | ICD-10-CM

## 2013-04-15 DIAGNOSIS — F17200 Nicotine dependence, unspecified, uncomplicated: Secondary | ICD-10-CM | POA: Diagnosis not present

## 2013-04-15 DIAGNOSIS — E785 Hyperlipidemia, unspecified: Secondary | ICD-10-CM | POA: Diagnosis present

## 2013-04-15 DIAGNOSIS — R141 Gas pain: Secondary | ICD-10-CM | POA: Diagnosis not present

## 2013-04-15 DIAGNOSIS — Y836 Removal of other organ (partial) (total) as the cause of abnormal reaction of the patient, or of later complication, without mention of misadventure at the time of the procedure: Secondary | ICD-10-CM | POA: Diagnosis present

## 2013-04-15 DIAGNOSIS — Z853 Personal history of malignant neoplasm of breast: Secondary | ICD-10-CM

## 2013-04-15 DIAGNOSIS — F3289 Other specified depressive episodes: Secondary | ICD-10-CM | POA: Diagnosis present

## 2013-04-15 DIAGNOSIS — Z8249 Family history of ischemic heart disease and other diseases of the circulatory system: Secondary | ICD-10-CM

## 2013-04-15 DIAGNOSIS — K56609 Unspecified intestinal obstruction, unspecified as to partial versus complete obstruction: Secondary | ICD-10-CM | POA: Diagnosis not present

## 2013-04-15 DIAGNOSIS — R143 Flatulence: Secondary | ICD-10-CM | POA: Diagnosis not present

## 2013-04-15 DIAGNOSIS — Z801 Family history of malignant neoplasm of trachea, bronchus and lung: Secondary | ICD-10-CM

## 2013-04-15 DIAGNOSIS — R110 Nausea: Secondary | ICD-10-CM | POA: Diagnosis not present

## 2013-04-15 DIAGNOSIS — K560 Paralytic ileus: Secondary | ICD-10-CM | POA: Diagnosis present

## 2013-04-15 DIAGNOSIS — J90 Pleural effusion, not elsewhere classified: Secondary | ICD-10-CM | POA: Diagnosis not present

## 2013-04-15 DIAGNOSIS — R1084 Generalized abdominal pain: Secondary | ICD-10-CM | POA: Diagnosis not present

## 2013-04-15 LAB — CBC
HCT: 34.9 % — ABNORMAL LOW (ref 36.0–46.0)
Hemoglobin: 12.4 g/dL (ref 12.0–15.0)
MCH: 33.5 pg (ref 26.0–34.0)
MCHC: 35.5 g/dL (ref 30.0–36.0)
MCV: 94.3 fL (ref 78.0–100.0)
Platelets: 303 10*3/uL (ref 150–400)
RBC: 3.7 MIL/uL — ABNORMAL LOW (ref 3.87–5.11)
RDW: 13 % (ref 11.5–15.5)
WBC: 13.6 10*3/uL — ABNORMAL HIGH (ref 4.0–10.5)

## 2013-04-15 LAB — URINALYSIS, ROUTINE W REFLEX MICROSCOPIC
Glucose, UA: 500 mg/dL — AB
Ketones, ur: 40 mg/dL — AB
Nitrite: POSITIVE — AB
Protein, ur: 100 mg/dL — AB
Specific Gravity, Urine: >1.03 — ABNORMAL HIGH (ref 1.005–1.030)
Urobilinogen, UA: 0.2 mg/dL (ref 0.0–1.0)
pH: 6 (ref 5.0–8.0)

## 2013-04-15 LAB — COMPREHENSIVE METABOLIC PANEL
ALT: 15 U/L (ref 0–35)
AST: 18 U/L (ref 0–37)
Albumin: 3.1 g/dL — ABNORMAL LOW (ref 3.5–5.2)
Alkaline Phosphatase: 124 U/L — ABNORMAL HIGH (ref 39–117)
BUN: 10 mg/dL (ref 6–23)
CO2: 27 mEq/L (ref 19–32)
Calcium: 9.2 mg/dL (ref 8.4–10.5)
Chloride: 87 mEq/L — ABNORMAL LOW (ref 96–112)
Creatinine, Ser: 0.51 mg/dL (ref 0.50–1.10)
GFR calc Af Amer: >90 mL/min (ref >90)
GFR calc non Af Amer: 88 mL/min — ABNORMAL LOW (ref >90)
Glucose, Bld: 259 mg/dL — ABNORMAL HIGH (ref 70–99)
Potassium: 3 mEq/L — ABNORMAL LOW (ref 3.5–5.1)
Sodium: 129 mEq/L — ABNORMAL LOW (ref 135–145)
Total Bilirubin: 1.2 mg/dL (ref 0.3–1.2)
Total Protein: 7.1 g/dL (ref 6.0–8.3)

## 2013-04-15 LAB — URINE MICROSCOPIC-ADD ON

## 2013-04-15 LAB — LIPASE, BLOOD: Lipase: 10 U/L — ABNORMAL LOW (ref 11–59)

## 2013-04-15 LAB — GLUCOSE, CAPILLARY: Glucose-Capillary: 252 mg/dL — ABNORMAL HIGH (ref 70–99)

## 2013-04-15 MED ORDER — CIPROFLOXACIN IN D5W 400 MG/200ML IV SOLN
400.0000 mg | Freq: Once | INTRAVENOUS | Status: AC
  Administered 2013-04-15: 400 mg via INTRAVENOUS
  Filled 2013-04-15: qty 200

## 2013-04-15 MED ORDER — ACETAMINOPHEN 650 MG RE SUPP: 650.0000 mg | Freq: Four times a day (QID) | RECTAL | Status: DC | PRN

## 2013-04-15 MED ORDER — ONDANSETRON HCL 4 MG/2ML IJ SOLN
INTRAMUSCULAR | Status: AC
  Filled 2013-04-15: qty 2

## 2013-04-15 MED ORDER — INSULIN ASPART 100 UNIT/ML ~~LOC~~ SOLN
0.0000 [IU] | Freq: Three times a day (TID) | SUBCUTANEOUS | Status: DC
  Administered 2013-04-16 (×3): 3 [IU] via SUBCUTANEOUS
  Administered 2013-04-17: 1 [IU] via SUBCUTANEOUS
  Administered 2013-04-17: 3 [IU] via SUBCUTANEOUS
  Administered 2013-04-17 – 2013-04-18 (×2): 2 [IU] via SUBCUTANEOUS
  Administered 2013-04-18: 3 [IU] via SUBCUTANEOUS
  Administered 2013-04-19: 2 [IU] via SUBCUTANEOUS

## 2013-04-15 MED ORDER — MAGNESIUM HYDROXIDE 400 MG/5ML PO SUSP
30.0000 mL | Freq: Every day | ORAL | Status: DC | PRN
  Administered 2013-04-15 (×2): 30 mL via ORAL
  Filled 2013-04-15 (×2): qty 30

## 2013-04-15 MED ORDER — METRONIDAZOLE IN NACL 5-0.79 MG/ML-% IV SOLN
500.0000 mg | Freq: Three times a day (TID) | INTRAVENOUS | Status: DC
  Filled 2013-04-15 (×6): qty 100

## 2013-04-15 MED ORDER — LACTATED RINGERS IV SOLN
INTRAVENOUS | Status: DC
  Administered 2013-04-15 – 2013-04-19 (×6): via INTRAVENOUS

## 2013-04-15 MED ORDER — BISACODYL 10 MG RE SUPP
10.0000 mg | Freq: Once | RECTAL | Status: AC
  Administered 2013-04-15: 10 mg via RECTAL
  Filled 2013-04-15: qty 1

## 2013-04-15 MED ORDER — METRONIDAZOLE IN NACL 5-0.79 MG/ML-% IV SOLN
500.0000 mg | Freq: Three times a day (TID) | INTRAVENOUS | Status: DC
  Administered 2013-04-15: 500 mg via INTRAVENOUS
  Filled 2013-04-15 (×7): qty 100

## 2013-04-15 MED ORDER — ACETAMINOPHEN 325 MG PO TABS: 650.0000 mg | ORAL_TABLET | Freq: Four times a day (QID) | ORAL | Status: DC | PRN

## 2013-04-15 MED ORDER — ONDANSETRON HCL 4 MG/2ML IJ SOLN
4.0000 mg | Freq: Once | INTRAMUSCULAR | Status: AC
  Administered 2013-04-15: 4 mg via INTRAVENOUS

## 2013-04-15 MED ORDER — PANTOPRAZOLE SODIUM 40 MG IV SOLR
40.0000 mg | Freq: Every day | INTRAVENOUS | Status: DC
  Administered 2013-04-15 – 2013-04-25 (×11): 40 mg via INTRAVENOUS
  Filled 2013-04-15 (×11): qty 40

## 2013-04-15 MED ORDER — INSULIN ASPART 100 UNIT/ML ~~LOC~~ SOLN
0.0000 [IU] | Freq: Every day | SUBCUTANEOUS | Status: DC
  Administered 2013-04-15: 3 [IU] via SUBCUTANEOUS

## 2013-04-15 MED ORDER — METRONIDAZOLE 500 MG PO TABS
500.0000 mg | ORAL_TABLET | Freq: Three times a day (TID) | ORAL | Status: DC
  Administered 2013-04-15 – 2013-04-16 (×3): 500 mg via ORAL
  Filled 2013-04-15 (×3): qty 1

## 2013-04-15 MED ORDER — ONDANSETRON HCL 4 MG/2ML IJ SOLN
4.0000 mg | Freq: Four times a day (QID) | INTRAMUSCULAR | Status: DC | PRN
  Administered 2013-04-15 – 2013-04-22 (×8): 4 mg via INTRAVENOUS
  Filled 2013-04-15 (×9): qty 2

## 2013-04-15 MED ORDER — POTASSIUM CHLORIDE 10 MEQ/100ML IV SOLN
10.0000 meq | INTRAVENOUS | Status: AC
  Administered 2013-04-15 (×3): 10 meq via INTRAVENOUS
  Filled 2013-04-15 (×4): qty 100

## 2013-04-15 MED ORDER — ENOXAPARIN SODIUM 40 MG/0.4ML ~~LOC~~ SOLN
40.0000 mg | SUBCUTANEOUS | Status: DC
  Administered 2013-04-15 – 2013-04-26 (×12): 40 mg via SUBCUTANEOUS
  Filled 2013-04-15 (×12): qty 0.4

## 2013-04-15 MED ORDER — SODIUM CHLORIDE 0.9 % IV SOLN
INTRAVENOUS | Status: DC
  Administered 2013-04-15: 1000 mL via INTRAVENOUS
  Administered 2013-04-17 – 2013-04-18 (×2): via INTRAVENOUS

## 2013-04-15 MED ORDER — LEVOFLOXACIN 500 MG PO TABS
500.0000 mg | ORAL_TABLET | Freq: Every day | ORAL | Status: DC
  Administered 2013-04-15 – 2013-04-16 (×2): 500 mg via ORAL
  Filled 2013-04-15 (×2): qty 1

## 2013-04-15 MED ORDER — POTASSIUM CHLORIDE 10 MEQ/100ML IV SOLN
10.0000 meq | Freq: Once | INTRAVENOUS | Status: AC
  Administered 2013-04-15: 10 meq via INTRAVENOUS
  Filled 2013-04-15: qty 100

## 2013-04-15 MED ORDER — DOCUSATE SODIUM 100 MG PO CAPS
100.0000 mg | ORAL_CAPSULE | Freq: Two times a day (BID) | ORAL | Status: DC
  Administered 2013-04-15 – 2013-04-26 (×16): 100 mg via ORAL
  Filled 2013-04-15 (×18): qty 1

## 2013-04-15 MED ORDER — HYDROCODONE-ACETAMINOPHEN 5-325 MG PO TABS
1.0000 | ORAL_TABLET | ORAL | Status: DC | PRN
  Administered 2013-04-15 – 2013-04-25 (×3): 1 via ORAL
  Administered 2013-04-25: 2 via ORAL
  Administered 2013-04-25: 1 via ORAL
  Filled 2013-04-15 (×4): qty 1
  Filled 2013-04-15: qty 2

## 2013-04-15 MED ORDER — FENTANYL CITRATE 0.05 MG/ML IJ SOLN
25.0000 ug | INTRAMUSCULAR | Status: DC | PRN
  Administered 2013-04-15 – 2013-04-25 (×28): 25 ug via INTRAVENOUS
  Filled 2013-04-15 (×28): qty 2

## 2013-04-15 MED ORDER — BISACODYL 10 MG RE SUPP
10.0000 mg | Freq: Every day | RECTAL | Status: DC | PRN
  Administered 2013-04-15 – 2013-04-23 (×3): 10 mg via RECTAL
  Filled 2013-04-15 (×3): qty 1

## 2013-04-15 NOTE — ED Notes (Signed)
Patient to restroom. Still unable to have BM.

## 2013-04-15 NOTE — ED Provider Notes (Signed)
History     CSN: [id number]  Arrival date & time      First MD Initiated Contact with Patient 04/15/13 0306      Chief Complaint  Patient presents with  . Abdominal Pain    had appendectomy yesterday and was released. now has abdominal pain and bloated.    (Consider location/radiation/quality/duration/timing/severity/associated sxs/prior treatment) HPI History is provided by the patient. Brought in by EMS from home for abdominal pain and nausea with distention.Laparoscopic appendectomy 04/13/2013 by Dr. Bernette Mayers. She is discharged home in tonight called EMS for concern that her pain is persistent, located all over.  She has not had a bowel movement since surgery and feels like she is not passing gas. No fevers or chills. Pain sharp in quality and moderate in severity. She declines any pain medications at this time.  No known aggravating or alleviating factors. Pain has been persistent unchanged.  Past Medical History  Diagnosis Date  . Hemorrhage of gastrointestinal tract, unspecified   . Other malaise and fatigue   . Tobacco use disorder   . Parotid tumor   . Neoplasm of malignant     breast  ,HX   . Diabetes mellitus   . Osteopenia   . Hypertension   . Hyperlipidemia   . Depression   . Anxiety state, unspecified     Past Surgical History  Procedure Laterality Date  . Mastectomy  1994    left breast -related to breast cancer  . Vesicovaginal fistula closure w/ tah  1979  . Cholecystectomy  2008  . Hemorrhoid surgery  1960s  . Laparoscopic appendectomy N/A 04/13/2013    Procedure: APPENDECTOMY LAPAROSCOPIC;  Surgeon: Donato Heinz, MD;  Location: [facility];  Service: General;  Laterality: N/A;    Family History  Problem Relation Age of Onset  . Heart failure Mother     CHF  . Cirrhosis Brother   . Lung cancer Father     brain mets  . Kidney cancer Mother   . Kidney cancer Sister     History  Substance Use Topics  . Smoking status: Current Every Day Smoker --  0.50 packs/day for 40 years    Types: Cigarettes  . Smokeless tobacco: Not on file  . Alcohol Use: No    OB History   Grav Para Term Preterm Abortions TAB SAB Ect Mult Living                  Review of Systems  Constitutional: Negative for fever and chills.  HENT: Negative for neck pain and neck stiffness.   Eyes: Negative for pain.  Respiratory: Negative for shortness of breath.   Cardiovascular: Negative for chest pain.  Gastrointestinal: Positive for abdominal pain. Negative for vomiting and blood in stool.  Genitourinary: Negative for dysuria.  Musculoskeletal: Negative for back pain.  Skin: Negative for rash.  Neurological: Negative for headaches.  All other systems reviewed and are negative.    Allergies  Penicillins  Home Medications   Current Outpatient Rx  Name  Route  Sig  Dispense  Refill  . ALPRAZolam (NIRAVAM) 0.25 MG dissolvable tablet   Oral   Take 0.25 mg by mouth at bedtime as needed.           Marland Kitchen amLODipine-benazepril (LOTREL) 5-20 MG per capsule   Oral   Take 1 capsule by mouth daily.           Marland Kitchen aspirin 325 MG tablet   Oral   Take 325  mg by mouth daily.           Marland Kitchen atorvastatin (LIPITOR) 10 MG tablet   Oral   Take 10 mg by mouth daily.           . calcium carbonate 200 MG capsule   Oral   Take 250 mg by mouth 2 (two) times daily with a meal.           . cholecalciferol (VITAMIN D) 1000 UNITS tablet   Oral   Take 1,000 Units by mouth daily.           Marland Kitchen esomeprazole (NEXIUM) 40 MG capsule   Oral   Take 40 mg by mouth daily before breakfast.           . glyBURIDE-metformin (GLUCOVANCE) 5-500 MG per tablet   Oral   Take 2 tablets by mouth 2 (two) times daily with a meal.           . HYDROcodone-acetaminophen (NORCO) 5-325 MG per tablet   Oral   Take 1-2 tablets by mouth every 4 (four) hours as needed for pain.   45 tablet   0   . indapamide (LOZOL) 1.25 MG tablet   Oral   Take 1.25 mg by mouth every morning.            . insulin glargine (LANTUS) 100 UNIT/ML injection   Subcutaneous   Inject 2-8 Units into the skin at bedtime. Based on what sugar readings are at bedtime.         Marland Kitchen levofloxacin (LEVAQUIN) 500 MG tablet   Oral   Take 1 tablet (500 mg total) by mouth daily.   3 tablet   0   . metroNIDAZOLE (FLAGYL) 500 MG tablet   Oral   Take 1 tablet (500 mg total) by mouth every 8 (eight) hours.   9 tablet   0   . Multiple Vitamin (MULTIVITAMIN) capsule   Oral   Take 1 capsule by mouth daily.           . Tiotropium Bromide Monohydrate (SPIRIVA HANDIHALER IN)   Inhalation   Inhale into the lungs. AS DIRECTED            There were no vitals taken for this visit.  Physical Exam  Constitutional: She is oriented to person, place, and time. She appears well-developed and well-nourished.  HENT:  Head: Normocephalic and atraumatic.  Eyes: EOM are normal. Pupils are equal, round, and reactive to light. No scleral icterus.  Neck: Neck supple.  Cardiovascular: Normal rate, regular rhythm and intact distal pulses.   Pulmonary/Chest: Effort normal. No respiratory distress.  Abdominal:  Distended abdomen with decreased bowel sounds and mild diffuse tenderness. No guarding or rebound.  Musculoskeletal: Normal range of motion. She exhibits no edema.  Neurological: She is alert and oriented to person, place, and time.  Skin: Skin is warm and dry.    ED Course  Procedures (including critical care time)   Results for orders placed during the hospital encounter of 04/15/13  CBC      Result Value Range   WBC 13.6 (*) 4.0 - 10.5 K/uL   RBC 3.70 (*) 3.87 - 5.11 MIL/uL   Hemoglobin 12.4  12.0 - 15.0 g/dL   HCT 34.9 (*) 36.0 - 46.0 %   MCV 94.3  78.0 - 100.0 fL   MCH 33.5  26.0 - 34.0 pg   MCHC 35.5  30.0 - 36.0 g/dL   RDW 13.0  11.5 -  15.5 %   Platelets 303  150 - 400 K/uL  COMPREHENSIVE METABOLIC PANEL      Result Value Range   Sodium 129 (*) 135 - 145 mEq/L   Potassium 3.0 (*)  3.5 - 5.1 mEq/L   Chloride 87 (*) 96 - 112 mEq/L   CO2 27  19 - 32 mEq/L   Glucose, Bld 259 (*) 70 - 99 mg/dL   BUN 10  6 - 23 mg/dL   Creatinine, Ser 0.51  0.50 - 1.10 mg/dL   Calcium 9.2  8.4 - 10.5 mg/dL   Total Protein 7.1  6.0 - 8.3 g/dL   Albumin 3.1 (*) 3.5 - 5.2 g/dL   AST 18  0 - 37 U/L   ALT 15  0 - 35 U/L   Alkaline Phosphatase 124 (*) 39 - 117 U/L   Total Bilirubin 1.2  0.3 - 1.2 mg/dL   GFR calc non Af Amer 88 (*) >90 mL/min   GFR calc Af Amer >90  >90 mL/min  LIPASE, BLOOD      Result Value Range   Lipase 10 (*) 11 - 59 U/L  URINALYSIS, ROUTINE W REFLEX MICROSCOPIC      Result Value Range   Color, Urine AMBER (*) YELLOW   APPearance CLOUDY (*) CLEAR   Specific Gravity, Urine >1.030 (*) 1.005 - 1.030   pH 6.0  5.0 - 8.0   Glucose, UA 500 (*) NEGATIVE mg/dL   Hgb urine dipstick SMALL (*) NEGATIVE   Bilirubin Urine SMALL (*) NEGATIVE   Ketones, ur 40 (*) NEGATIVE mg/dL   Protein, ur 100 (*) NEGATIVE mg/dL   Urobilinogen, UA 0.2  0.0 - 1.0 mg/dL   Nitrite POSITIVE (*) NEGATIVE   Leukocytes, UA SMALL (*) NEGATIVE  URINE MICROSCOPIC-ADD ON      Result Value Range   Squamous Epithelial / LPF FEW (*) RARE   WBC, UA 21-50  <3 WBC/hpf   RBC / HPF 3-6  <3 RBC/hpf   Bacteria, UA MANY (*) RARE   Casts GRANULAR CAST (*) NEGATIVE   Ct Abdomen Pelvis W Contrast  04/13/2013   *RADIOLOGY REPORT*  Clinical Data: Abdominal pain, tenderness and bloating.  CT ABDOMEN AND PELVIS WITH CONTRAST  Technique:  Multidetector CT imaging of the abdomen and pelvis was performed following the standard protocol during bolus administration of intravenous contrast.  Contrast: 100 mL of Omnipaque 300 IV contrast  Comparison: Abdominal ultrasound performed 10/04/2006  Findings: Mild bibasilar atelectasis or scarring is noted.  A tiny hiatal hernia is seen.  The liver and spleen are unremarkable in appearance.  The patient is status post cholecystectomy, with clips noted along the gallbladder fossa.   There is marked atrophy of much of the body and tail of the pancreas, with associated prominence of the residual pancreatic duct.  The head and proximal body of the pancreas are grossly unremarkable in appearance.  A 1.3 cm hypodensity at the interpole region of the right kidney is nonspecific; this could be further assessed on renal ultrasound. Scattered small left renal cysts are seen.  Minimal nonspecific soft tissue stranding is noted about the upper pole of the left kidney.  There is no evidence of hydronephrosis.  No renal or ureteral stones are seen.  The small bowel is unremarkable in appearance.  The stomach is within normal limits.  No acute vascular abnormalities are seen.  A long appendix is visualized, tracking toward the midline.  The proximal appendix is unremarkable in appearance.  There is dilatation of the distal appendix to 1.0 cm, with wall enhancement and significant surrounding soft tissue inflammation, superior to the bladder.  Findings are compatible with acute tip appendicitis; a small amount of associated free fluid is seen, and there is corresponding wall thickening along the dome of the bladder.  There is also soft tissue inflammation along the adjacent sigmoid colon.  There is no definite evidence of perforation or abscess formation at this time.  The bladder is moderately distended and otherwise grossly unremarkable in appearance.  Free fluid from the appendiceal process tracks into the pelvic cul-de-sac, with mild associated soft tissue inflammation.  The patient is status post hysterectomy. No suspicious adnexal masses are seen.  No inguinal lymphadenopathy is seen.  No acute osseous abnormalities are identified.  IMPRESSION:  1.  Dilatation of the distal appendix to 1.0 cm, with wall enhancement and significant surrounding soft tissue inflammation, just superior to the bladder.  Findings compatible with acute tip appendicitis.  Small amount of associated free fluid noted tracking  into the pelvis.  Corresponding reactive wall thickening involving the dome of the bladder and adjacent sigmoid colon.  No evidence of perforation or abscess formation at this time. 2.  1.3 cm hypodensity at the interpole region of the right kidney; this is nonspecific, and could be further assessed on renal ultrasound on an elective non-emergent basis, as deemed clinically appropriate. 3.  Scattered small left renal cysts seen. 4.  Tiny hiatal hernia noted. 5.  Marked atrophy of much of the body and tail of the pancreas, with associated dilatation of the distal pancreatic duct. 6.  Mild bibasilar atelectasis or scarring noted.  These results were called by telephone on 04/13/2013 at 05:22 a.m. to Dr. Daleen Bo, who verbally acknowledged these results.   Original Report Authenticated By: Santa Lighter, M.D.   Dg Abd Acute W/chest  04/15/2013   *RADIOLOGY REPORT*  Clinical Data: Abdominal pain  ACUTE ABDOMEN SERIES (ABDOMEN 2 VIEW & CHEST 1 VIEW)  Comparison: 04/13/2013 abdominal pelvic CT  Findings: Small right pleural effusion with associated airspace opacity.  Atherosclerotic calcification.  Cardiac size upper normal.  Gaseous distension of loops of large and small bowel. Multiple small bowel air-fluid levels.  There is contrast within colon. Surgical clips right upper quadrant.  Contrast within the bladder. Small amount of intraperitoneal air under the left diaphragm and outlining bowel loops is nonspecific in the recently postoperative. No acute osseous finding.  IMPRESSION: Small right pleural effusion with associated airspace opacity; atelectasis versus infiltrate.  Gaseous distension of bowel with contrast noted in colon, presumably reflects a postoperative ileus.  Recommend attention with serial KUB follow-up.  Small amount of intraperitoneal air is nonspecific given in the recently postoperative state.  Attention at follow-up.   Original Report Authenticated By: Carlos Levering, M.D.    6:17 AM  on recheck is unchanged, family would prefer PT to be admitted. D/w Dr Geroge Baseman as above - plan Dulcolax supp now and if no BM he will evaluate bedside in the ED.   MDM  ABD pain with post op ileus - imaging and labs reviewed - [facility] consult  VS, prior records/ CT as above, and nursing notes all reviewed        Teressa Lower, MD 04/16/13 0000

## 2013-04-15 NOTE — ED Notes (Signed)
Blood glucose via ems was 265

## 2013-04-15 NOTE — ED Notes (Signed)
Patient assisted to restroom. She states she feels like she needs to have a BM

## 2013-04-15 NOTE — ED Notes (Signed)
Report received from Quincy Sheehan, RN. I assume care of patient at this time. Patient reporting post op abdominal pain from Lap appendectomy 04/13/13. Patient reports not having BM since before surgery.

## 2013-04-15 NOTE — ED Notes (Signed)
Pt assisted to restroom, family member remained in restroom w/ pt.

## 2013-04-15 NOTE — ED Notes (Signed)
Dr Benn Moulder updated on patient condition. Will admit, family/patient informed. Patient requesting pain medication.

## 2013-04-15 NOTE — ED Provider Notes (Signed)
  Physical Exam  BP 157/62  Pulse 95  Temp(Src) 99.6 F (37.6 C) (Oral)  Resp 22  Ht _0  (1.626 m)  Wt 148 lb (67.132 kg)  BMI 25.39 kg/m2  SpO2 96%  Physical Exam  ED Course  Procedures  MDM Shift report - change of shift - pt has post op ileus - no BM but has been ambulating in ED, tolerating PO and declining pain medicines.  She has evidence of UTI - has been started on Levaquin post op - urine culture ordered.      Johnna Acosta, MD 04/15/13 [phone]

## 2013-04-15 NOTE — H&P (Signed)
[name] is an 79 y.o. female.   Chief Complaint: No bowel function, increasing abdominal tenderness HPI: Patient returns to [facility] emergency department early this morning after worsening nausea and diffuse abdominal tenderness. She states she stopped passing flatus upon returning home. She's not had a bowel movement since her hospitalization. She's had some nausea. He has been tolerating minimal by mouth but no emesis. No fevers or chills. No discharge or drainage from the incisions.  Past Medical History  Diagnosis Date  . Hemorrhage of gastrointestinal tract, unspecified   . Other malaise and fatigue   . Tobacco use disorder   . Parotid tumor   . Neoplasm of malignant     breast  ,HX   . Diabetes mellitus   . Osteopenia   . Hypertension   . Hyperlipidemia   . Depression   . Anxiety state, unspecified     Past Surgical History  Procedure Laterality Date  . Mastectomy  1994    left breast -related to breast cancer  . Vesicovaginal fistula closure w/ tah  1979  . Cholecystectomy  2008  . Hemorrhoid surgery  1960s  . Laparoscopic appendectomy N/A 04/13/2013    Procedure: APPENDECTOMY LAPAROSCOPIC;  Surgeon: Donato Heinz, MD;  Location: [facility];  Service: General;  Laterality: N/A;    Family History  Problem Relation Age of Onset  . Heart failure Mother     CHF  . Cirrhosis Brother   . Lung cancer Father     brain mets  . Kidney cancer Mother   . Kidney cancer Sister    Social History:  reports that she has been smoking Cigarettes.  She has a 20 pack-year smoking history. She does not have any smokeless tobacco history on file. She reports that she does not drink alcohol or use illicit drugs.  Allergies:  Allergies  Allergen Reactions  . Penicillins Rash    Medications Prior to Admission  Medication Sig Dispense Refill  . ALPRAZolam (NIRAVAM) 0.25 MG dissolvable tablet Take 0.25 mg by mouth at bedtime as needed.        Marland Kitchen amLODipine-benazepril (LOTREL) 5-20  MG per capsule Take 1 capsule by mouth daily.        Marland Kitchen aspirin 325 MG tablet Take 325 mg by mouth daily.        Marland Kitchen atorvastatin (LIPITOR) 10 MG tablet Take 10 mg by mouth daily.        . calcium carbonate 200 MG capsule Take 250 mg by mouth 2 (two) times daily with a meal.        . cholecalciferol (VITAMIN D) 1000 UNITS tablet Take 1,000 Units by mouth daily.        Marland Kitchen docusate sodium (COLACE) 100 MG capsule Take 300 mg by mouth daily as needed for constipation.      Marland Kitchen esomeprazole (NEXIUM) 40 MG capsule Take 40 mg by mouth daily before breakfast.        . glyBURIDE-metformin (GLUCOVANCE) 5-500 MG per tablet Take 2 tablets by mouth 2 (two) times daily with a meal.        . HYDROcodone-acetaminophen (NORCO) 5-325 MG per tablet Take 1-2 tablets by mouth every 4 (four) hours as needed for pain.  45 tablet  0  . indapamide (LOZOL) 1.25 MG tablet Take 1.25 mg by mouth every morning.        . insulin glargine (LANTUS) 100 UNIT/ML injection Inject 2-8 Units into the skin at bedtime. Based on what sugar readings  are at bedtime.      . metroNIDAZOLE (FLAGYL) 500 MG tablet Take 1 tablet (500 mg total) by mouth every 8 (eight) hours.  9 tablet  0  . tiotropium (SPIRIVA) 18 MCG inhalation capsule Place 18 mcg into inhaler and inhale daily.        Results for orders placed during the hospital encounter of 04/15/13 (from the past 48 hour(s))  CBC     Status: Abnormal   Collection Time    04/15/13  3:12 AM      Result Value Range   WBC 13.6 (*) 4.0 - 10.5 K/uL   RBC 3.70 (*) 3.87 - 5.11 MIL/uL   Hemoglobin 12.4  12.0 - 15.0 g/dL   HCT 34.9 (*) 36.0 - 46.0 %   MCV 94.3  78.0 - 100.0 fL   MCH 33.5  26.0 - 34.0 pg   MCHC 35.5  30.0 - 36.0 g/dL   RDW 13.0  11.5 - 15.5 %   Platelets 303  150 - 400 K/uL  COMPREHENSIVE METABOLIC PANEL     Status: Abnormal   Collection Time    04/15/13  3:12 AM      Result Value Range   Sodium 129 (*) 135 - 145 mEq/L   Potassium 3.0 (*) 3.5 - 5.1 mEq/L   Chloride 87 (*) 96  - 112 mEq/L   CO2 27  19 - 32 mEq/L   Glucose, Bld 259 (*) 70 - 99 mg/dL   BUN 10  6 - 23 mg/dL   Creatinine, Ser 0.51  0.50 - 1.10 mg/dL   Calcium 9.2  8.4 - 10.5 mg/dL   Total Protein 7.1  6.0 - 8.3 g/dL   Albumin 3.1 (*) 3.5 - 5.2 g/dL   AST 18  0 - 37 U/L   ALT 15  0 - 35 U/L   Alkaline Phosphatase 124 (*) 39 - 117 U/L   Total Bilirubin 1.2  0.3 - 1.2 mg/dL   GFR calc non Af Amer 88 (*) >90 mL/min   GFR calc Af Amer >90  >90 mL/min   Comment:            The eGFR has been calculated     using the CKD EPI equation.     This calculation has not been     validated in all clinical     situations.     eGFR's persistently     <90 mL/min signify     possible Chronic Kidney Disease.  LIPASE, BLOOD     Status: Abnormal   Collection Time    04/15/13  3:12 AM      Result Value Range   Lipase 10 (*) 11 - 59 U/L  URINALYSIS, ROUTINE W REFLEX MICROSCOPIC     Status: Abnormal   Collection Time    04/15/13  5:27 AM      Result Value Range   Color, Urine AMBER (*) YELLOW   Comment: BIOCHEMICALS MAY BE AFFECTED BY COLOR   APPearance CLOUDY (*) CLEAR   Specific Gravity, Urine >1.030 (*) 1.005 - 1.030   pH 6.0  5.0 - 8.0   Glucose, UA 500 (*) NEGATIVE mg/dL   Hgb urine dipstick SMALL (*) NEGATIVE   Bilirubin Urine SMALL (*) NEGATIVE   Ketones, ur 40 (*) NEGATIVE mg/dL   Protein, ur 100 (*) NEGATIVE mg/dL   Urobilinogen, UA 0.2  0.0 - 1.0 mg/dL   Nitrite POSITIVE (*) NEGATIVE   Leukocytes, UA SMALL (*) NEGATIVE  URINE MICROSCOPIC-ADD ON     Status: Abnormal   Collection Time    04/15/13  5:27 AM      Result Value Range   Squamous Epithelial / LPF FEW (*) RARE   WBC, UA 21-50  <3 WBC/hpf   RBC / HPF 3-6  <3 RBC/hpf   Bacteria, UA MANY (*) RARE   Casts GRANULAR CAST (*) NEGATIVE   Dg Abd Acute W/chest  04/15/2013   *RADIOLOGY REPORT*  Clinical Data: Abdominal pain  ACUTE ABDOMEN SERIES (ABDOMEN 2 VIEW & CHEST 1 VIEW)  Comparison: 04/13/2013 abdominal pelvic CT  Findings: Small  right pleural effusion with associated airspace opacity.  Atherosclerotic calcification.  Cardiac size upper normal.  Gaseous distension of loops of large and small bowel. Multiple small bowel air-fluid levels.  There is contrast within colon. Surgical clips right upper quadrant.  Contrast within the bladder. Small amount of intraperitoneal air under the left diaphragm and outlining bowel loops is nonspecific in the recently postoperative. No acute osseous finding.  IMPRESSION: Small right pleural effusion with associated airspace opacity; atelectasis versus infiltrate.  Gaseous distension of bowel with contrast noted in colon, presumably reflects a postoperative ileus.  Recommend attention with serial KUB follow-up.  Small amount of intraperitoneal air is nonspecific given in the recently postoperative state.  Attention at follow-up.   Original Report Authenticated By: Carlos Levering, M.D.    Review of Systems  Constitutional: Positive for malaise/fatigue. Negative for fever, chills and weight loss.  HENT: Negative.   Eyes: Negative.   Respiratory: Negative.   Cardiovascular: Negative.   Gastrointestinal: Positive for nausea and abdominal pain (moderate diffuse). Negative for heartburn, vomiting, diarrhea, constipation, blood in stool and melena.  Genitourinary: Positive for dysuria and frequency.  Musculoskeletal: Negative.   Skin: Negative.   Neurological: Negative.   Endo/Heme/Allergies: Negative.   Psychiatric/Behavioral: Negative.     Blood pressure 155/60, pulse 97, temperature 98.7 F (37.1 C), temperature source Oral, resp. rate 18, height _0  (1.626 m), weight 68.5 kg (151 lb 0.2 oz), SpO2 98.00%. Physical Exam  Constitutional: She is oriented to person, place, and time. She appears well-developed and well-nourished. No distress.  Uncomfortable  HENT:  Head: Normocephalic and atraumatic.  Eyes: Conjunctivae and EOM are normal. Pupils are equal, round, and reactive to light.   Neck: Normal range of motion. Neck supple. No tracheal deviation present. No thyromegaly present.  Cardiovascular: Normal rate, regular rhythm and normal heart sounds.   Respiratory: Effort normal and breath sounds normal. No respiratory distress.  GI: Soft. She exhibits distension. She exhibits no mass. There is tenderness (diffuse tenderness no peritoneal signs). There is no rebound and no guarding.  Intermittent bowel sounds, sparse  Lymphadenopathy:    She has no cervical adenopathy.  Neurological: She is alert and oriented to person, place, and time.  Skin: Skin is warm and dry.  Incisions are clean dry and intact.     Assessment/Plan Abdominal pain, suspected postoperative ileus. Continue IV fluid hydration. DVT prophylaxis. Patient was on home dose of Levaquin and Flagyl and I will continue this at this time. Increase patient's activity with ambulation. At this time I do not have any suspicion of intra-abdominal abscess causing her symptoms with this was discussed with the family. Should her symptomatology persist over the weekend then we'll likely get a CT of the abdomen and pelvis on Monday should she continue on the current course. Otherwise await for bowel function returned .  Mayme Profeta C 04/15/2013, 1:55 PM

## 2013-04-15 NOTE — ED Notes (Signed)
Councelled patient's family about concerns. Awaiting Surgery MD at this time.

## 2013-04-15 NOTE — ED Notes (Signed)
Patient unsuccessful with attempt to have BM. Returned to room. No distress.

## 2013-04-15 NOTE — ED Notes (Signed)
Report given to Marcello Moores, Therapist, sports. No questions. Ready for patient.

## 2013-04-15 NOTE — ED Notes (Signed)
Patient provided coffee after consultation with EDP. No other requests or complaints.

## 2013-04-16 ENCOUNTER — Encounter (HOSPITAL_COMMUNITY): Payer: Self-pay | Admitting: Cardiology

## 2013-04-16 LAB — BASIC METABOLIC PANEL
BUN: 10 mg/dL (ref 6–23)
CO2: 29 mEq/L (ref 19–32)
Calcium: 9.1 mg/dL (ref 8.4–10.5)
Chloride: 88 mEq/L — ABNORMAL LOW (ref 96–112)
Creatinine, Ser: 0.44 mg/dL — ABNORMAL LOW (ref 0.50–1.10)
GFR calc Af Amer: >90 mL/min (ref >90)
GFR calc non Af Amer: >90 mL/min (ref >90)
Glucose, Bld: 261 mg/dL — ABNORMAL HIGH (ref 70–99)
Potassium: 2.8 mEq/L — ABNORMAL LOW (ref 3.5–5.1)
Sodium: 131 mEq/L — ABNORMAL LOW (ref 135–145)

## 2013-04-16 LAB — GLUCOSE, CAPILLARY
Glucose-Capillary: 217 mg/dL — ABNORMAL HIGH (ref 70–99)
Glucose-Capillary: 208 mg/dL — ABNORMAL HIGH (ref 70–99)
Glucose-Capillary: 214 mg/dL — ABNORMAL HIGH (ref 70–99)
Glucose-Capillary: 192 mg/dL — ABNORMAL HIGH (ref 70–99)

## 2013-04-16 LAB — CBC
HCT: 33.9 % — ABNORMAL LOW (ref 36.0–46.0)
Hemoglobin: 12 g/dL (ref 12.0–15.0)
MCH: 33.2 pg (ref 26.0–34.0)
MCHC: 35.4 g/dL (ref 30.0–36.0)
MCV: 93.9 fL (ref 78.0–100.0)
Platelets: 363 10*3/uL (ref 150–400)
RBC: 3.61 MIL/uL — ABNORMAL LOW (ref 3.87–5.11)
RDW: 12.8 % (ref 11.5–15.5)
WBC: 12.7 10*3/uL — ABNORMAL HIGH (ref 4.0–10.5)

## 2013-04-16 LAB — URINE CULTURE
Colony Count: NO GROWTH
Culture: NO GROWTH

## 2013-04-16 LAB — MAGNESIUM: Magnesium: 1.7 mg/dL (ref 1.5–2.5)

## 2013-04-16 MED ORDER — MAGNESIUM SULFATE 40 MG/ML IJ SOLN
2.0000 g | Freq: Once | INTRAMUSCULAR | Status: AC
  Administered 2013-04-16: 2 g via INTRAVENOUS
  Filled 2013-04-16: qty 50

## 2013-04-16 MED ORDER — METRONIDAZOLE IN NACL 5-0.79 MG/ML-% IV SOLN
500.0000 mg | Freq: Three times a day (TID) | INTRAVENOUS | Status: DC
  Administered 2013-04-16 – 2013-04-20 (×12): 500 mg via INTRAVENOUS
  Filled 2013-04-16 (×15): qty 100

## 2013-04-16 MED ORDER — METOPROLOL TARTRATE 1 MG/ML IV SOLN
5.0000 mg | Freq: Four times a day (QID) | INTRAVENOUS | Status: DC
  Administered 2013-04-16 – 2013-04-26 (×37): 5 mg via INTRAVENOUS
  Filled 2013-04-16 (×37): qty 5

## 2013-04-16 MED ORDER — POTASSIUM CHLORIDE 10 MEQ/100ML IV SOLN
10.0000 meq | INTRAVENOUS | Status: AC
  Administered 2013-04-16 (×4): 10 meq via INTRAVENOUS
  Filled 2013-04-16: qty 400

## 2013-04-16 MED ORDER — LEVOFLOXACIN IN D5W 500 MG/100ML IV SOLN
500.0000 mg | INTRAVENOUS | Status: DC
  Administered 2013-04-17 – 2013-04-20 (×4): 500 mg via INTRAVENOUS
  Filled 2013-04-16 (×5): qty 100

## 2013-04-16 NOTE — Progress Notes (Signed)
Pt. Ambulated approximately 75 feet on RA, no SOB no assistive devices.

## 2013-04-16 NOTE — Progress Notes (Signed)
Subjective: Nausea still persistent. No significant flatus or bowel movement. Abdominal pain about the same colicky in nature. Feels tight.  Objective: Vital signs in last 24 hours: Temp:  [98.3 F (36.8 C)-98.5 F (36.9 C)] 98.5 F (36.9 C) (06/01 0428) Pulse Rate:  [96-98] 98 (06/01 0428) Resp:  [18] 18 (06/01 0428) BP: (153-185)/(71-77) 153/77 mmHg (06/01 0428) SpO2:  [94 %-99 %] 99 % (06/01 0428) Last BM Date: 04/10/13  Intake/Output from previous day: 05/31 0701 - 06/01 0700 In: 350 [P.O.:350] Out: -  Intake/Output this shift: Total I/O In: 2015 [P.O.:240; I.V.:1775] Out: -   General appearance: alert and no distress Resp: clear to auscultation bilaterally Cardio: regular rate and rhythm GI: Quiet, soft, distended, mild to moderate diffuse tenderness. No peritoneal signs. Incisions are clean dry and intact.  Lab Results:   Recent Labs  04/15/13 0312 04/16/13 0452  WBC 13.6* 12.7*  HGB 12.4 12.0  HCT 34.9* 33.9*  PLT 303 363   BMET  Recent Labs  04/15/13 0312 04/16/13 0452  NA 129* 131*  K 3.0* 2.8*  CL 87* 88*  CO2 27 29  GLUCOSE 259* 261*  BUN 10 10  CREATININE 0.51 0.44*  CALCIUM 9.2 9.1   PT/INR No results found for this basename: LABPROT, INR,  in the last 72 hours ABG No results found for this basename: PHART, PCO2, PO2, HCO3,  in the last 72 hours  Studies/Results: Dg Abd Acute W/chest  04/15/2013   *RADIOLOGY REPORT*  Clinical Data: Abdominal pain  ACUTE ABDOMEN SERIES (ABDOMEN 2 VIEW & CHEST 1 VIEW)  Comparison: 04/13/2013 abdominal pelvic CT  Findings: Small right pleural effusion with associated airspace opacity.  Atherosclerotic calcification.  Cardiac size upper normal.  Gaseous distension of loops of large and small bowel. Multiple small bowel air-fluid levels.  There is contrast within colon. Surgical clips right upper quadrant.  Contrast within the bladder. Small amount of intraperitoneal air under the left diaphragm and outlining  bowel loops is nonspecific in the recently postoperative. No acute osseous finding.  IMPRESSION: Small right pleural effusion with associated airspace opacity; atelectasis versus infiltrate.  Gaseous distension of bowel with contrast noted in colon, presumably reflects a postoperative ileus.  Recommend attention with serial KUB follow-up.  Small amount of intraperitoneal air is nonspecific given in the recently postoperative state.  Attention at follow-up.   Original Report Authenticated By: Carlos Levering, M.D.    Anti-infectives: Anti-infectives   Start     Dose/Rate Route Frequency Ordered Stop   04/15/13 2200  metroNIDAZOLE (FLAGYL) tablet 500 mg     500 mg Oral 3 times per day 04/15/13 1241     04/15/13 1330  levofloxacin (LEVAQUIN) tablet 500 mg     500 mg Oral Daily 04/15/13 1322     04/15/13 1200  metroNIDAZOLE (FLAGYL) IVPB 500 mg  Status:  Discontinued     500 mg 100 mL/hr over 60 Minutes Intravenous Every 8 hours 04/15/13 1125 04/15/13 1241   04/15/13 1000  metroNIDAZOLE (FLAGYL) IVPB 500 mg  Status:  Discontinued     500 mg 100 mL/hr over 60 Minutes Intravenous Every 8 hours 04/15/13 0952 04/15/13 1125   04/15/13 0615  ciprofloxacin (CIPRO) IVPB 400 mg     400 mg 200 mL/hr over 60 Minutes Intravenous  Once 04/15/13 0600 04/15/13 0730      Assessment/Plan: s/p * No surgery found * Postoperative adynamic ileus. Continue minimal by mouth. Continue IV fluid hydration. Continue antibiotic coverage. Increase activity as  tolerated. Discussed current course with patient's family and patient. If she continues to have symptomatology tomorrow will check a CT of the abdomen and pelvis to rule out possible intra-abdominal abscess contributing to her symptomatology.  LOS: 1 day    Romie Tay C 04/16/2013

## 2013-04-17 ENCOUNTER — Encounter (HOSPITAL_COMMUNITY): Payer: Self-pay | Admitting: Radiology

## 2013-04-17 ENCOUNTER — Inpatient Hospital Stay (HOSPITAL_COMMUNITY): Payer: Medicare Other

## 2013-04-17 LAB — CBC
HCT: 35 % — ABNORMAL LOW (ref 36.0–46.0)
Hemoglobin: 12.4 g/dL (ref 12.0–15.0)
MCH: 33.5 pg (ref 26.0–34.0)
MCHC: 35.4 g/dL (ref 30.0–36.0)
MCV: 94.6 fL (ref 78.0–100.0)
Platelets: 407 10*3/uL — ABNORMAL HIGH (ref 150–400)
RBC: 3.7 MIL/uL — ABNORMAL LOW (ref 3.87–5.11)
RDW: 12.9 % (ref 11.5–15.5)
WBC: 10 10*3/uL (ref 4.0–10.5)

## 2013-04-17 LAB — GLUCOSE, CAPILLARY
Glucose-Capillary: 215 mg/dL — ABNORMAL HIGH (ref 70–99)
Glucose-Capillary: 142 mg/dL — ABNORMAL HIGH (ref 70–99)
Glucose-Capillary: 183 mg/dL — ABNORMAL HIGH (ref 70–99)
Glucose-Capillary: 180 mg/dL — ABNORMAL HIGH (ref 70–99)

## 2013-04-17 LAB — BASIC METABOLIC PANEL
BUN: 9 mg/dL (ref 6–23)
CO2: 26 mEq/L (ref 19–32)
Calcium: 8.7 mg/dL (ref 8.4–10.5)
Chloride: 89 mEq/L — ABNORMAL LOW (ref 96–112)
Creatinine, Ser: 0.41 mg/dL — ABNORMAL LOW (ref 0.50–1.10)
GFR calc Af Amer: >90 mL/min (ref >90)
GFR calc non Af Amer: >90 mL/min (ref >90)
Glucose, Bld: 223 mg/dL — ABNORMAL HIGH (ref 70–99)
Potassium: 2.9 mEq/L — ABNORMAL LOW (ref 3.5–5.1)
Sodium: 130 mEq/L — ABNORMAL LOW (ref 135–145)

## 2013-04-17 MED ORDER — IOHEXOL 300 MG/ML  SOLN
100.0000 mL | Freq: Once | INTRAMUSCULAR | Status: AC | PRN
  Administered 2013-04-17: 100 mL via INTRAVENOUS

## 2013-04-17 MED ORDER — POTASSIUM CHLORIDE 10 MEQ/100ML IV SOLN
10.0000 meq | INTRAVENOUS | Status: AC
  Administered 2013-04-17 (×2): 10 meq via INTRAVENOUS
  Filled 2013-04-17: qty 400

## 2013-04-17 MED ORDER — IOHEXOL 300 MG/ML  SOLN
50.0000 mL | Freq: Once | INTRAMUSCULAR | Status: AC | PRN
  Administered 2013-04-17: 50 mL via ORAL

## 2013-04-17 MED ORDER — IOHEXOL 300 MG/ML  SOLN: 50.0000 mL | INTRAMUSCULAR | Status: AC

## 2013-04-17 MED ORDER — LORAZEPAM 2 MG/ML IJ SOLN
0.5000 mg | Freq: Four times a day (QID) | INTRAMUSCULAR | Status: DC | PRN
  Administered 2013-04-17: 0.25 mg via INTRAVENOUS
  Administered 2013-04-18 – 2013-04-24 (×3): 0.5 mg via INTRAVENOUS
  Filled 2013-04-17 (×4): qty 1

## 2013-04-17 NOTE — Progress Notes (Signed)
Inpatient Diabetes Program Recommendations  AACE/ADA: New Consensus Statement on Inpatient Glycemic Control (2013)  Target Ranges:  Prepandial:   less than 140 mg/dL      Peak postprandial:   less than 180 mg/dL (1-2 hours)      Critically ill patients:  140 - 180 mg/dL   Results for ERINN, MENDOSA (MRN [id number]) as of 04/17/2013 06:11  Ref. Range 04/15/2013 21:11 04/16/2013 07:51 04/16/2013 11:46 04/16/2013 16:33 04/16/2013 21:05  Glucose-Capillary Latest Range: 70-99 mg/dL 252 (H) 217 (H) 208 (H) 214 (H) 192 (H)   Inpatient Diabetes Program Recommendations Insulin - Basal: Please consider ordering low dose basal insulin; recommend starting with Lantus 5 units daily.  Note: Blood glucose over the past 30 hours has ranged from 192-252 mg/dl.  Patient uses Lantus 2-8 units (depending on blood glucose reading) daily at home.  Please consider ordering low dose basal insulin to improve inpatient glycemic control.  Will continue to follow.  Thanks, Barnie Alderman, RN, MSN, CCRN Diabetes Coordinator Inpatient Diabetes Program [phone]

## 2013-04-17 NOTE — Progress Notes (Signed)
Subjective: Patient was readmitted yesterday due to post-op ileus and hypokalemia. She has not yet moved her bowel. She NG tube to suction.  Objective: Vital signs in last 24 hours: Temp:  [98.2 F (36.8 C)-98.4 F (36.9 C)] 98.2 F (36.8 C) (06/02 0447) Pulse Rate:  [99-106] 99 (06/02 0447) Resp:  [18] 18 (06/02 0447) BP: (149-186)/(79-91) 150/91 mmHg (06/02 0447) SpO2:  [92 %-94 %] 94 % (06/02 0447) Weight change:  Last BM Date: 04/10/13  Intake/Output from previous day: 06/01 0701 - 06/02 0700 In: 2165 [P.O.:390; I.V.:1775] Out: 800 [Emesis/NG output:800]  PHYSICAL EXAM General appearance: alert, fatigued and no distress Resp: clear to auscultation bilaterally Cardio: S1, S2 normal GI: mildly disdended, soft and lax, bowel sound is hypoactive Extremities: extremities normal, atraumatic, no cyanosis or edema  Lab Results:    _0 @ ABGS No results found for this basename: PHART, PCO2, PO2ART, TCO2, HCO3,  in the last 72 hours CULTURES Recent Results (from the past 240 hour(s))  URINE CULTURE     Status: None   Collection Time    04/13/13  1:22 AM      Result Value Range Status   Specimen Description URINE, CLEAN CATCH   Final   Special Requests NONE   Final   Culture  Setup Time 04/13/2013 14:37   Final   Colony Count 65,000 COLONIES/ML   Final   Culture     Final   Value: Multiple bacterial morphotypes present, none predominant. Suggest appropriate recollection if clinically indicated.   Report Status 04/14/2013 FINAL   Final  SURGICAL PCR SCREEN     Status: None   Collection Time    04/13/13  9:45 AM      Result Value Range Status   MRSA, PCR NEGATIVE  NEGATIVE Final   Staphylococcus aureus NEGATIVE  NEGATIVE Final   Comment:            The Xpert SA Assay (FDA     approved for NASAL specimens     in patients over 68 years of age),     is one component of     a comprehensive surveillance     program.  Test performance has     been validated by  [employer] for patients greater     than or equal to 52 year old.     It is not intended     to diagnose infection nor to     guide or monitor treatment.  URINE CULTURE     Status: None   Collection Time    04/15/13  5:27 AM      Result Value Range Status   Specimen Description URINE, CLEAN CATCH   Final   Special Requests NONE   Final   Culture  Setup Time 04/15/2013 18:26   Final   Colony Count NO GROWTH   Final   Culture NO GROWTH   Final   Report Status 04/16/2013 FINAL   Final   Studies/Results: No results found.  Medications: I have reviewed the patient's current medications.  Assesment: 1. Post-op ileus 2. Hypokalemia 3. S/P appendectomy 4. DM type II 5. Hypertension Active Problems:   * No active hospital problems. *    Plan: Continue iv hydration K+ supplement Will monitor BMP NG suctioning as per Surgery plan    LOS: 2 days   Jeremy Mclamb 04/17/2013, 8:11 AM

## 2013-04-17 NOTE — Care Management Note (Unsigned)
    Page 1 of 2   04/25/2013     1:55:26 PM   CARE MANAGEMENT NOTE 04/25/2013  Patient:  MEAGON, DUSKIN   Account Number:  [id number]  Date Initiated:  04/17/2013  Documentation initiated by:  Theophilus Kinds  Subjective/Objective Assessment:   Pt admitted from home with ileus. Pts daughter and son will be living with pt and providing 24 hour care to pt. Pt has been independent prior to illness.     Action/Plan:   Pts daughter is very inteersted in [facility] RN, PT, and aide at discharge. Will arrange once discharge written with [facility] agency of their choice.   Anticipated DC Date:  04/26/2013   Anticipated DC Plan:  SKILLED NURSING FACILITY  In-house referral  Clinical Social Worker      DC Planning Services  CM consult      Choice offered to / List presented to:             Status of service:  In process, will continue to follow Medicare Important Message given?   (If response is "NO", the following Medicare IM given date fields will be blank) Date Medicare IM given:   Date Additional Medicare IM given:    Discharge Disposition:    Per UR Regulation:    If discussed at Long Length of Stay Meetings, dates discussed:   04/25/2013    Comments:  04/25/13 Claretha Cooper RN BSN CM Pt and daugher in room requesting CM. Concern due to pt weak and unsure she can take care of herself at home. Lives with son who works and daughter who has her own health issues. Pt would like to speak with CSW regarding placement. CSW consult placed  04/19/13 1115 Vladimir Creeks RN Pt still has NG, and is starting on TPN today. She and daughter are willing to discuss SNF if needed at D/C, but still prefer home, if possible. Pt is still very weak and is not able to get up and walk as she usually can. Nurses are walking her some in the room. Discussed PT can become involved once pt more stable and able to participate more. Will refer to CSW if continues to not progress, or possibly LTAC if needs to continue on  TPN. 04/17/13 [place], RN BSN CM

## 2013-04-17 NOTE — Progress Notes (Signed)
  Subjective: Nausea improved since nasogastric tube is in place. States she is passing some flatus. No bowel movement.  Objective: Vital signs in last 24 hours: Temp:  [98.2 F (36.8 Chambers)-98.4 F (36.9 Chambers)] 98.2 F (36.8 Chambers) (06/02 0447) Pulse Rate:  [99-106] 99 (06/02 0447) Resp:  [18] 18 (06/02 0447) BP: (149-186)/(79-91) 150/91 mmHg (06/02 0447) SpO2:  [92 %-94 %] 94 % (06/02 0447) Last BM Date: 04/10/13  Intake/Output from previous day: 06/01 0701 - 06/02 0700 In: 2165 [P.O.:390; I.V.:1775] Out: 800 [Emesis/NG output:800] Intake/Output this shift: Total I/O In: 120 [P.O.:120] Out: -   General appearance: alert and no distress GI: Intermittent bowel sounds, soft, moderate distention. Expected postoperative tenderness. Incisions are clean dry and intact. Steri-Strips are in place.  Lab Results:   Recent Labs  04/16/13 0452 04/17/13 0818  WBC 12.7* 10.0  HGB 12.0 12.4  HCT 33.9* 35.0*  PLT 363 407*   BMET  Recent Labs  04/16/13 0452 04/17/13 0818  NA 131* 130*  K 2.8* 2.9*  CL 88* 89*  CO2 29 26  GLUCOSE 261* 223*  BUN 10 9  CREATININE 0.44* 0.41*  CALCIUM 9.1 8.7   PT/INR No results found for this basename: LABPROT, INR,  in the last 72 hours ABG No results found for this basename: PHART, PCO2, PO2, HCO3,  in the last 72 hours  Studies/Results: No results found.  Anti-infectives: Anti-infectives   Start     Dose/Rate Route Frequency Ordered Stop   04/17/13 1000  levofloxacin (LEVAQUIN) IVPB 500 mg     500 mg 100 mL/hr over 60 Minutes Intravenous Every 24 hours 04/16/13 1411     04/16/13 2200  metroNIDAZOLE (FLAGYL) IVPB 500 mg     500 mg 100 mL/hr over 60 Minutes Intravenous Every 8 hours 04/16/13 1411     04/15/13 2200  metroNIDAZOLE (FLAGYL) tablet 500 mg  Status:  Discontinued     500 mg Oral 3 times per day 04/15/13 1241 04/16/13 1411   04/15/13 1330  levofloxacin (LEVAQUIN) tablet 500 mg  Status:  Discontinued     500 mg Oral Daily  04/15/13 1322 04/16/13 1411   04/15/13 1200  metroNIDAZOLE (FLAGYL) IVPB 500 mg  Status:  Discontinued     500 mg 100 mL/hr over 60 Minutes Intravenous Every 8 hours 04/15/13 1125 04/15/13 1241   04/15/13 1000  metroNIDAZOLE (FLAGYL) IVPB 500 mg  Status:  Discontinued     500 mg 100 mL/hr over 60 Minutes Intravenous Every 8 hours 04/15/13 0952 04/15/13 1125   04/15/13 0615  ciprofloxacin (CIPRO) IVPB 400 mg     400 mg 200 mL/hr over 60 Minutes Intravenous  Once 04/15/13 0600 04/15/13 0730      Assessment/Plan: s/p * No surgery found * Postoperative ileus. Patient is demonstrating some improvement today. She does appear to have some increase in her bowel function however as discussed with patient and family Will proceed with a CT of the abdomen and pelvis to ensure there is no intra-abdominal abscess contributing to her symptomatology. Continue nasogastric tube for now. Continue IV fluid and IV medications for now. Reassured family at length  LOS: 2 days    [name] 04/17/2013

## 2013-04-18 LAB — GLUCOSE, CAPILLARY
Glucose-Capillary: 211 mg/dL — ABNORMAL HIGH (ref 70–99)
Glucose-Capillary: 159 mg/dL — ABNORMAL HIGH (ref 70–99)
Glucose-Capillary: 126 mg/dL — ABNORMAL HIGH (ref 70–99)
Glucose-Capillary: 157 mg/dL — ABNORMAL HIGH (ref 70–99)

## 2013-04-18 LAB — BASIC METABOLIC PANEL
BUN: 7 mg/dL (ref 6–23)
CO2: 25 mEq/L (ref 19–32)
Calcium: 8.4 mg/dL (ref 8.4–10.5)
Chloride: 92 mEq/L — ABNORMAL LOW (ref 96–112)
Creatinine, Ser: 0.41 mg/dL — ABNORMAL LOW (ref 0.50–1.10)
GFR calc Af Amer: >90 mL/min (ref >90)
GFR calc non Af Amer: >90 mL/min (ref >90)
Glucose, Bld: 206 mg/dL — ABNORMAL HIGH (ref 70–99)
Potassium: 2.5 mEq/L — CL (ref 3.5–5.1)
Sodium: 134 mEq/L — ABNORMAL LOW (ref 135–145)

## 2013-04-18 LAB — CBC
HCT: 32.8 % — ABNORMAL LOW (ref 36.0–46.0)
Hemoglobin: 11.9 g/dL — ABNORMAL LOW (ref 12.0–15.0)
MCH: 33.9 pg (ref 26.0–34.0)
MCHC: 36.3 g/dL — ABNORMAL HIGH (ref 30.0–36.0)
MCV: 93.4 fL (ref 78.0–100.0)
Platelets: 379 10*3/uL (ref 150–400)
RBC: 3.51 MIL/uL — ABNORMAL LOW (ref 3.87–5.11)
RDW: 12.9 % (ref 11.5–15.5)
WBC: 8.8 10*3/uL (ref 4.0–10.5)

## 2013-04-18 MED ORDER — POTASSIUM CHLORIDE 10 MEQ/100ML IV SOLN
10.0000 meq | INTRAVENOUS | Status: AC
  Administered 2013-04-18 (×6): 10 meq via INTRAVENOUS
  Filled 2013-04-18 (×6): qty 100

## 2013-04-18 NOTE — Progress Notes (Signed)
Subjective: Frustrated.  No flatus, but nausea better.  No fever or chills.  Abdominal pain better.    Objective: Vital signs in last 24 hours: Temp:  [98 F (36.7 C)-98.7 F (37.1 C)] 98.7 F (37.1 C) (06/03 2115) Pulse Rate:  [86-98] 86 (06/03 2115) Resp:  [18-20] 20 (06/03 2115) BP: (151-183)/(59-80) 153/59 mmHg (06/03 2115) SpO2:  [97 %-100 %] 100 % (06/03 2115) Last BM Date: 04/10/13  Intake/Output from previous day: 06/02 0701 - 06/03 0700 In: 10122.5 [P.O.:360; I.V.:9262.5; IV Piggyback:500] Out: 1300 [Emesis/NG output:1300] Intake/Output this shift:    General appearance: alert and no distress Resp: clear to auscultation bilaterally Cardio: regular rate and rhythm GI: Quiet, soft, mild-moderate distention.  Mild abdominal tenderness.  No peritoneal pain.    Lab Results:   Recent Labs  04/17/13 0818 04/18/13 0446  WBC 10.0 8.8  HGB 12.4 11.9*  HCT 35.0* 32.8*  PLT 407* 379   BMET  Recent Labs  04/17/13 0818 04/18/13 0446  NA 130* 134*  K 2.9* 2.5*  CL 89* 92*  CO2 26 25  GLUCOSE 223* 206*  BUN 9 7  CREATININE 0.41* 0.41*  CALCIUM 8.7 8.4   PT/INR No results found for this basename: LABPROT, INR,  in the last 72 hours ABG No results found for this basename: PHART, PCO2, PO2, HCO3,  in the last 72 hours  Studies/Results: Ct Abdomen Pelvis W Contrast  04/17/2013   *RADIOLOGY REPORT*  Clinical Data: Abdominal pain and bloating.  Appendectomy 05/29.  CT ABDOMEN AND PELVIS WITH CONTRAST  Technique:  Multidetector CT imaging of the abdomen and pelvis was performed following the standard protocol during bolus administration of intravenous contrast.  Contrast: 73m OMNIPAQUE IOHEXOL 300 MG/ML  SOLN, 1077mOMNIPAQUE IOHEXOL 300 MG/ML  SOLN  Comparison: 04/13/2013.  Findings: Lung Bases: Small bilateral pleural effusions.  Collapse / consolidation of the lower lobes, greater on the right than left. Coronary artery atherosclerosis is present. If office based  assessment of coronary risk factors has not been performed, it is now recommended.  Liver:  Mild postcholecystectomy intrahepatic biliary ductal dilation.  No mass lesions.  Spleen:  Normal.  Gallbladder:  Surgically absent.  Common bile duct:  Normal.  No calcified stones.  Pancreas:  Fatty atrophy of the tail and distal body of the pancreas.  Prominence of the distal pancreatic duct.  Adrenal glands:  Normal.  Kidneys:  Tiny low density lesions in the left kidney likely representing cysts but too small to definitively characterize. There is subtle decreased attenuation of the right inferior renal pole however this is favored to represent beam hardening artifact from the adjacent colon which is filled with oral contrast.  13 mm interpolar right renal cystic lesion is unchanged, previously described.  Stomach:  Small hiatal hernia.  Nasogastric tube terminates in the proximal gastric fundus.  Mild gaseous distention.  Small bowel:  There is marked dilation of small bowel extending into the anatomic pelvis.  The decompressed terminal ileum is present.  Transition point is not identified however one is suspected in the right lower quadrant where there is a fluid-filled small bowel and adjacent decompressed terminal ileum.  Colon:   Decompressed.  Appendix has been resected.  Contrast is present within the proximal colon.  Pelvic Genitourinary:  Urinary bladder distended.  Moderate amount of fluid in the anatomic pelvis which demonstrates intermediate attenuation and dependently and low attenuation anteriorly.  This suggests a complex fluid which may represent postoperative using/hemorrhage.  Free or infected  fluid cannot be excluded. Hysterectomy.  Bones:   No aggressive osseous lesions.  Osteopenia.  Vasculature: Atherosclerosis.  No aneurysm or acute abnormality.  Body Wall: Mild anasarca changes.  Expected postoperative gas in the anterior body wall.  Moderate volume ascites.  IMPRESSION: 1.  Small bowel  obstruction.  The decompressed terminal ileum and relative decompression of the colon makes this more consistent with obstruction and ileus. 2.  Moderate volume of ascites with debris or complex fluid in the anatomic pelvis. 3.  Cholecystectomy. 4.  Low density renal lesions, probably representing cysts.  Right interpolar lesion is indeterminant. 5.  Bilateral pleural effusions with collapse / consolidation of right greater than left lower lobes.   Original Report Authenticated By: Dereck Ligas, M.D.    Anti-infectives: Anti-infectives   Start     Dose/Rate Route Frequency Ordered Stop   04/17/13 1000  levofloxacin (LEVAQUIN) IVPB 500 mg     500 mg 100 mL/hr over 60 Minutes Intravenous Every 24 hours 04/16/13 1411     04/16/13 2200  metroNIDAZOLE (FLAGYL) IVPB 500 mg     500 mg 100 mL/hr over 60 Minutes Intravenous Every 8 hours 04/16/13 1411     04/15/13 2200  metroNIDAZOLE (FLAGYL) tablet 500 mg  Status:  Discontinued     500 mg Oral 3 times per day 04/15/13 1241 04/16/13 1411   04/15/13 1330  levofloxacin (LEVAQUIN) tablet 500 mg  Status:  Discontinued     500 mg Oral Daily 04/15/13 1322 04/16/13 1411   04/15/13 1200  metroNIDAZOLE (FLAGYL) IVPB 500 mg  Status:  Discontinued     500 mg 100 mL/hr over 60 Minutes Intravenous Every 8 hours 04/15/13 1125 04/15/13 1241   04/15/13 1000  metroNIDAZOLE (FLAGYL) IVPB 500 mg  Status:  Discontinued     500 mg 100 mL/hr over 60 Minutes Intravenous Every 8 hours 04/15/13 0952 04/15/13 1125   04/15/13 0615  ciprofloxacin (CIPRO) IVPB 400 mg     400 mg 200 mL/hr over 60 Minutes Intravenous  Once 04/15/13 0600 04/15/13 0730      Assessment/Plan: s/p * No surgery found * Adynamic ileus, post-operative ileus.  Dispite CT findings, patient's symptoms and clinical presentation much more consistent with adynamic ileus.  WBC has improved.  Continue abx coverage.  Ambuate.  Continue NG decompression.  If no significant change will proceed with PICC line  placement and TPN likely tomorrow.  LONG discussion with the patient and family regarding her course.  Reassured them that by exam, lab, and radiographic evaluation there is no current evidence for worrisome underlying etiology.    LOS: 3 days    Darrly Loberg C 04/18/2013

## 2013-04-18 NOTE — Progress Notes (Signed)
Subjective: Patient complains of abdominal pain. She has not moved her bowel. No fever or chills..  Objective: Vital signs in last 24 hours: Temp:  [97.8 F (36.6 C)-98.4 F (36.9 C)] 98.4 F (36.9 C) (06/03 0417) Pulse Rate:  [91-96] 95 (06/03 0417) Resp:  [18-20] 18 (06/03 0417) BP: (150-183)/(70-81) 183/80 mmHg (06/03 0417) SpO2:  [95 %-99 %] 97 % (06/03 0417) Weight change:  Last BM Date: 04/10/13  Intake/Output from previous day: 06/02 0701 - 06/03 0700 In: 10122.5 [P.O.:360; I.V.:9262.5; IV Piggyback:500] Out: 1300 [Emesis/NG output:1300]  PHYSICAL EXAM General appearance: alert, fatigued and no distress Resp: diminished breath sounds bilaterally and rhonchi bilaterally Cardio: S1, S2 normal GI: mildly disdended, soft and lax, bowel sound is hypoactive Extremities: extremities normal, atraumatic, no cyanosis or edema  Lab Results:    _0 @ ABGS No results found for this basename: PHART, PCO2, PO2ART, TCO2, HCO3,  in the last 72 hours CULTURES Recent Results (from the past 240 hour(s))  URINE CULTURE     Status: None   Collection Time    04/13/13  1:22 AM      Result Value Range Status   Specimen Description URINE, CLEAN CATCH   Final   Special Requests NONE   Final   Culture  Setup Time 04/13/2013 14:37   Final   Colony Count 65,000 COLONIES/ML   Final   Culture     Final   Value: Multiple bacterial morphotypes present, none predominant. Suggest appropriate recollection if clinically indicated.   Report Status 04/14/2013 FINAL   Final  SURGICAL PCR SCREEN     Status: None   Collection Time    04/13/13  9:45 AM      Result Value Range Status   MRSA, PCR NEGATIVE  NEGATIVE Final   Staphylococcus aureus NEGATIVE  NEGATIVE Final   Comment:            The Xpert SA Assay (FDA     approved for NASAL specimens     in patients over 59 years of age),     is one component of     a comprehensive surveillance     program.  Test performance has     been  validated by [employer] for patients greater     than or equal to 86 year old.     It is not intended     to diagnose infection nor to     guide or monitor treatment.  URINE CULTURE     Status: None   Collection Time    04/15/13  5:27 AM      Result Value Range Status   Specimen Description URINE, CLEAN CATCH   Final   Special Requests NONE   Final   Culture  Setup Time 04/15/2013 18:26   Final   Colony Count NO GROWTH   Final   Culture NO GROWTH   Final   Report Status 04/16/2013 FINAL   Final   Studies/Results: Ct Abdomen Pelvis W Contrast  04/17/2013   *RADIOLOGY REPORT*  Clinical Data: Abdominal pain and bloating.  Appendectomy 05/29.  CT ABDOMEN AND PELVIS WITH CONTRAST  Technique:  Multidetector CT imaging of the abdomen and pelvis was performed following the standard protocol during bolus administration of intravenous contrast.  Contrast: 24m OMNIPAQUE IOHEXOL 300 MG/ML  SOLN, 1088mOMNIPAQUE IOHEXOL 300 MG/ML  SOLN  Comparison: 04/13/2013.  Findings: Lung Bases: Small bilateral pleural effusions.  Collapse / consolidation of the lower lobes,  greater on the right than left. Coronary artery atherosclerosis is present. If office based assessment of coronary risk factors has not been performed, it is now recommended.  Liver:  Mild postcholecystectomy intrahepatic biliary ductal dilation.  No mass lesions.  Spleen:  Normal.  Gallbladder:  Surgically absent.  Common bile duct:  Normal.  No calcified stones.  Pancreas:  Fatty atrophy of the tail and distal body of the pancreas.  Prominence of the distal pancreatic duct.  Adrenal glands:  Normal.  Kidneys:  Tiny low density lesions in the left kidney likely representing cysts but too small to definitively characterize. There is subtle decreased attenuation of the right inferior renal pole however this is favored to represent beam hardening artifact from the adjacent colon which is filled with oral contrast.  13 mm interpolar right renal  cystic lesion is unchanged, previously described.  Stomach:  Small hiatal hernia.  Nasogastric tube terminates in the proximal gastric fundus.  Mild gaseous distention.  Small bowel:  There is marked dilation of small bowel extending into the anatomic pelvis.  The decompressed terminal ileum is present.  Transition point is not identified however one is suspected in the right lower quadrant where there is a fluid-filled small bowel and adjacent decompressed terminal ileum.  Colon:   Decompressed.  Appendix has been resected.  Contrast is present within the proximal colon.  Pelvic Genitourinary:  Urinary bladder distended.  Moderate amount of fluid in the anatomic pelvis which demonstrates intermediate attenuation and dependently and low attenuation anteriorly.  This suggests a complex fluid which may represent postoperative using/hemorrhage.  Free or infected fluid cannot be excluded. Hysterectomy.  Bones:   No aggressive osseous lesions.  Osteopenia.  Vasculature: Atherosclerosis.  No aneurysm or acute abnormality.  Body Wall: Mild anasarca changes.  Expected postoperative gas in the anterior body wall.  Moderate volume ascites.  IMPRESSION: 1.  Small bowel obstruction.  The decompressed terminal ileum and relative decompression of the colon makes this more consistent with obstruction and ileus. 2.  Moderate volume of ascites with debris or complex fluid in the anatomic pelvis. 3.  Cholecystectomy. 4.  Low density renal lesions, probably representing cysts.  Right interpolar lesion is indeterminant. 5.  Bilateral pleural effusions with collapse / consolidation of right greater than left lower lobes.   Original Report Authenticated By: Dereck Ligas, M.D.    Medications: I have reviewed the patient's current medications.  Assesment: 1. Post-op ileus 2. Hypokalemia 3. S/P appendectomy 4. DM type II 5. Hypertension Active Problems:   * No active hospital problems. *    Plan: Continue iv hydration K+  supplement Will monitor BMP NG suctioning as per Surgery plan    LOS: 3 days   Tess Potts 04/18/2013, 7:48 AM

## 2013-04-18 NOTE — Progress Notes (Signed)
Per lab patient's potassium 2.5 this morning, paged Dr. Benn Moulder await return call.Patient resting in bed no signs of distress noted, pt's daughter at bedside.

## 2013-04-18 NOTE — Progress Notes (Signed)
Inpatient Diabetes Program Recommendations  AACE/ADA: New Consensus Statement on Inpatient Glycemic Control (2013)  Target Ranges:  Prepandial:   less than 140 mg/dL      Peak postprandial:   less than 180 mg/dL (1-2 hours)      Critically ill patients:  140 - 180 mg/dL   Results for [name], [name] (MRN [id number]) as of 04/18/2013 08:18  Ref. Range 04/17/2013 07:29 04/17/2013 11:30 04/17/2013 16:29 04/17/2013 22:13 04/18/2013 07:51  Glucose-Capillary Latest Range: 70-99 mg/dL 215 (H) 142 (H) 183 (H) 180 (H) 211 (H)   Inpatient Diabetes Program Recommendations Insulin - Basal: Please consider ordering low dose basal insulin; recommend Lantus 5 units daily.  Note: Blood glucose over the past 24 hours has ranged from 142-215 mg/dl. Patient uses Lantus 2-8 units (depending on blood glucose reading) daily at home and on last admission patient was receiving Lantus as used at home and glycemic control was improved.  Please consider ordering low dose basal insulin to improve inpatient glycemic control. Will continue to follow.    Thanks,  Barnie Alderman, RN, MSN, CCRN  Diabetes Coordinator  Inpatient Diabetes Program  [phone]

## 2013-04-19 ENCOUNTER — Encounter (HOSPITAL_COMMUNITY): Payer: Medicare Other

## 2013-04-19 ENCOUNTER — Inpatient Hospital Stay (HOSPITAL_COMMUNITY): Payer: Medicare Other

## 2013-04-19 LAB — BASIC METABOLIC PANEL
BUN: 3 mg/dL — ABNORMAL LOW (ref 6–23)
BUN: <3 mg/dL — ABNORMAL LOW (ref 6–23)
CO2: 25 mEq/L (ref 19–32)
CO2: 28 mEq/L (ref 19–32)
Calcium: 8.3 mg/dL — ABNORMAL LOW (ref 8.4–10.5)
Calcium: 8.4 mg/dL (ref 8.4–10.5)
Chloride: 90 mEq/L — ABNORMAL LOW (ref 96–112)
Chloride: 90 mEq/L — ABNORMAL LOW (ref 96–112)
Creatinine, Ser: 0.35 mg/dL — ABNORMAL LOW (ref 0.50–1.10)
Creatinine, Ser: 0.4 mg/dL — ABNORMAL LOW (ref 0.50–1.10)
GFR calc Af Amer: >90 mL/min (ref >90)
GFR calc Af Amer: >90 mL/min (ref >90)
GFR calc non Af Amer: >90 mL/min (ref >90)
GFR calc non Af Amer: >90 mL/min (ref >90)
Glucose, Bld: 193 mg/dL — ABNORMAL HIGH (ref 70–99)
Glucose, Bld: 132 mg/dL — ABNORMAL HIGH (ref 70–99)
Potassium: 2.5 mEq/L — CL (ref 3.5–5.1)
Potassium: 3 mEq/L — ABNORMAL LOW (ref 3.5–5.1)
Sodium: 132 mEq/L — ABNORMAL LOW (ref 135–145)
Sodium: 132 mEq/L — ABNORMAL LOW (ref 135–145)

## 2013-04-19 LAB — GLUCOSE, CAPILLARY
Glucose-Capillary: 109 mg/dL — ABNORMAL HIGH (ref 70–99)
Glucose-Capillary: 172 mg/dL — ABNORMAL HIGH (ref 70–99)
Glucose-Capillary: 180 mg/dL — ABNORMAL HIGH (ref 70–99)
Glucose-Capillary: 183 mg/dL — ABNORMAL HIGH (ref 70–99)
Glucose-Capillary: 185 mg/dL — ABNORMAL HIGH (ref 70–99)

## 2013-04-19 LAB — CBC
HCT: 34.8 % — ABNORMAL LOW (ref 36.0–46.0)
Hemoglobin: 12.3 g/dL (ref 12.0–15.0)
MCH: 33.6 pg (ref 26.0–34.0)
MCHC: 35.3 g/dL (ref 30.0–36.0)
MCV: 95.1 fL (ref 78.0–100.0)
Platelets: 385 10*3/uL (ref 150–400)
RBC: 3.66 MIL/uL — ABNORMAL LOW (ref 3.87–5.11)
RDW: 13.4 % (ref 11.5–15.5)
WBC: 10 10*3/uL (ref 4.0–10.5)

## 2013-04-19 MED ORDER — TRACE MINERALS CR-CU-F-FE-I-MN-MO-SE-ZN IV SOLN
INTRAVENOUS | Status: AC
  Administered 2013-04-19: 19:00:00 via INTRAVENOUS
  Filled 2013-04-19: qty 2000

## 2013-04-19 MED ORDER — INSULIN ASPART 100 UNIT/ML ~~LOC~~ SOLN
0.0000 [IU] | SUBCUTANEOUS | Status: DC
  Administered 2013-04-19 – 2013-04-20 (×3): 3 [IU] via SUBCUTANEOUS
  Administered 2013-04-20 (×3): 5 [IU] via SUBCUTANEOUS
  Administered 2013-04-20 (×2): 3 [IU] via SUBCUTANEOUS
  Administered 2013-04-21: 2 [IU] via SUBCUTANEOUS
  Administered 2013-04-21: 3 [IU] via SUBCUTANEOUS
  Administered 2013-04-21: 5 [IU] via SUBCUTANEOUS
  Administered 2013-04-21: 2 [IU] via SUBCUTANEOUS
  Administered 2013-04-21: 5 [IU] via SUBCUTANEOUS
  Administered 2013-04-21 – 2013-04-22 (×7): 2 [IU] via SUBCUTANEOUS
  Administered 2013-04-23 (×3): 3 [IU] via SUBCUTANEOUS
  Administered 2013-04-23: 2 [IU] via SUBCUTANEOUS
  Administered 2013-04-23 – 2013-04-24 (×2): 3 [IU] via SUBCUTANEOUS
  Administered 2013-04-24: 2 [IU] via SUBCUTANEOUS
  Administered 2013-04-24: 3 [IU] via SUBCUTANEOUS
  Administered 2013-04-24 – 2013-04-25 (×2): 2 [IU] via SUBCUTANEOUS
  Administered 2013-04-25: 3 [IU] via SUBCUTANEOUS
  Administered 2013-04-25 (×4): 5 [IU] via SUBCUTANEOUS
  Administered 2013-04-26: 2 [IU] via SUBCUTANEOUS
  Administered 2013-04-26: 5 [IU] via SUBCUTANEOUS

## 2013-04-19 MED ORDER — PHENOL 1.4 % MT LIQD
1.0000 | OROMUCOSAL | Status: DC | PRN
  Administered 2013-04-19: 1 via OROMUCOSAL
  Filled 2013-04-19: qty 177

## 2013-04-19 MED ORDER — POTASSIUM CHLORIDE 10 MEQ/100ML IV SOLN
10.0000 meq | INTRAVENOUS | Status: AC
  Administered 2013-04-19 (×5): 10 meq via INTRAVENOUS
  Filled 2013-04-19 (×5): qty 100

## 2013-04-19 MED ORDER — SODIUM CHLORIDE 0.9 % IJ SOLN
10.0000 mL | INTRAMUSCULAR | Status: DC | PRN
  Filled 2013-04-19: qty 3

## 2013-04-19 MED ORDER — POTASSIUM CHLORIDE 2 MEQ/ML IV SOLN
INTRAVENOUS | Status: AC
  Filled 2013-04-19 (×2): qty 1000

## 2013-04-19 MED ORDER — POTASSIUM CHLORIDE 10 MEQ/100ML IV SOLN
INTRAVENOUS | Status: AC
  Filled 2013-04-19: qty 100

## 2013-04-19 MED ORDER — POTASSIUM CHLORIDE 10 MEQ/100ML IV SOLN: 10.0000 meq | INTRAVENOUS | Status: DC

## 2013-04-19 MED ORDER — POTASSIUM CHLORIDE 2 MEQ/ML IV SOLN
INTRAVENOUS | Status: AC
  Administered 2013-04-19: 12:00:00 via INTRAVENOUS
  Filled 2013-04-19 (×3): qty 1000

## 2013-04-19 MED ORDER — FAT EMULSION 20 % IV EMUL
250.0000 mL | INTRAVENOUS | Status: AC
  Administered 2013-04-19: 250 mL via INTRAVENOUS
  Filled 2013-04-19: qty 250

## 2013-04-19 MED ORDER — SODIUM CHLORIDE 0.9 % IJ SOLN
10.0000 mL | Freq: Two times a day (BID) | INTRAMUSCULAR | Status: DC
  Administered 2013-04-20 – 2013-04-23 (×5): 10 mL
  Administered 2013-04-24: 20 mL
  Administered 2013-04-24: 10 mL

## 2013-04-19 NOTE — Progress Notes (Signed)
UR Chart Review Completed

## 2013-04-19 NOTE — Progress Notes (Signed)
Subjective: Nausea about the same. Abdominal pain slightly improved. Does state she is passing flatus in the night and this morning. No bowel movement. No fever chills  Objective: Vital signs in last 24 hours: Temp:  [98.2 F (36.8 C)-98.7 F (37.1 C)] 98.2 F (36.8 C) (06/04 0631) Pulse Rate:  [86-97] 94 (06/04 0631) Resp:  [18-20] 18 (06/04 0631) BP: (151-159)/(59-66) 159/66 mmHg (06/04 0631) SpO2:  [96 %-100 %] 96 % (06/04 0631) Last BM Date: 04/10/13  Intake/Output from previous day: 06/03 0701 - 06/04 0700 In: 2132.5 [P.O.:120; I.V.:1112.5; IV Piggyback:900] Out: 1525 [Emesis/NG output:1525] Intake/Output this shift:    General appearance: alert and no distress GI: Diffuse/sparse bowel sounds but present. Mildly tender diffusely. No peritoneal signs abdomen remains soft the  Lab Results:   Recent Labs  04/18/13 0446 04/19/13 0450  WBC 8.8 10.0  HGB 11.9* 12.3  HCT 32.8* 34.8*  PLT 379 385   BMET  Recent Labs  04/18/13 0446 04/19/13 0450  NA 134* 132*  K 2.5* 2.5*  CL 92* 90*  CO2 25 25  GLUCOSE 206* 193*  BUN 7 3*  CREATININE 0.41* 0.35*  CALCIUM 8.4 8.3*   PT/INR No results found for this basename: LABPROT, INR,  in the last 72 hours ABG No results found for this basename: PHART, PCO2, PO2, HCO3,  in the last 72 hours  Studies/Results: Ct Abdomen Pelvis W Contrast  04/17/2013   *RADIOLOGY REPORT*  Clinical Data: Abdominal pain and bloating.  Appendectomy 05/29.  CT ABDOMEN AND PELVIS WITH CONTRAST  Technique:  Multidetector CT imaging of the abdomen and pelvis was performed following the standard protocol during bolus administration of intravenous contrast.  Contrast: 34m OMNIPAQUE IOHEXOL 300 MG/ML  SOLN, 1018mOMNIPAQUE IOHEXOL 300 MG/ML  SOLN  Comparison: 04/13/2013.  Findings: Lung Bases: Small bilateral pleural effusions.  Collapse / consolidation of the lower lobes, greater on the right than left. Coronary artery atherosclerosis is present. If  office based assessment of coronary risk factors has not been performed, it is now recommended.  Liver:  Mild postcholecystectomy intrahepatic biliary ductal dilation.  No mass lesions.  Spleen:  Normal.  Gallbladder:  Surgically absent.  Common bile duct:  Normal.  No calcified stones.  Pancreas:  Fatty atrophy of the tail and distal body of the pancreas.  Prominence of the distal pancreatic duct.  Adrenal glands:  Normal.  Kidneys:  Tiny low density lesions in the left kidney likely representing cysts but too small to definitively characterize. There is subtle decreased attenuation of the right inferior renal pole however this is favored to represent beam hardening artifact from the adjacent colon which is filled with oral contrast.  13 mm interpolar right renal cystic lesion is unchanged, previously described.  Stomach:  Small hiatal hernia.  Nasogastric tube terminates in the proximal gastric fundus.  Mild gaseous distention.  Small bowel:  There is marked dilation of small bowel extending into the anatomic pelvis.  The decompressed terminal ileum is present.  Transition point is not identified however one is suspected in the right lower quadrant where there is a fluid-filled small bowel and adjacent decompressed terminal ileum.  Colon:   Decompressed.  Appendix has been resected.  Contrast is present within the proximal colon.  Pelvic Genitourinary:  Urinary bladder distended.  Moderate amount of fluid in the anatomic pelvis which demonstrates intermediate attenuation and dependently and low attenuation anteriorly.  This suggests a complex fluid which may represent postoperative using/hemorrhage.  Free or infected fluid  cannot be excluded. Hysterectomy.  Bones:   No aggressive osseous lesions.  Osteopenia.  Vasculature: Atherosclerosis.  No aneurysm or acute abnormality.  Body Wall: Mild anasarca changes.  Expected postoperative gas in the anterior body wall.  Moderate volume ascites.  IMPRESSION: 1.  Small  bowel obstruction.  The decompressed terminal ileum and relative decompression of the colon makes this more consistent with obstruction and ileus. 2.  Moderate volume of ascites with debris or complex fluid in the anatomic pelvis. 3.  Cholecystectomy. 4.  Low density renal lesions, probably representing cysts.  Right interpolar lesion is indeterminant. 5.  Bilateral pleural effusions with collapse / consolidation of right greater than left lower lobes.   Original Report Authenticated By: Dereck Ligas, M.D.   Dg Chest Port 1 View  04/19/2013   *RADIOLOGY REPORT*  Clinical Data: PICC placement.  PORTABLE CHEST - 1 VIEW  Comparison: Single view of the chest 04/15/2013.  Findings: Right PICC is in place with the tip projecting over the mid superior vena cava.  NG tube tip is in the stomach.  Lungs are emphysematous.  No pneumothorax.  There has been some increase in right greater than left pleural effusions and basilar airspace disease.  IMPRESSION:  1.  Tip of right PICC projects over the mid superior vena cava. 2.  Slight increase in small bilateral pleural effusions and basilar airspace disease, worse on the right.   Original Report Authenticated By: Orlean Patten, M.D.    Anti-infectives: Anti-infectives   Start     Dose/Rate Route Frequency Ordered Stop   04/17/13 1000  levofloxacin (LEVAQUIN) IVPB 500 mg     500 mg 100 mL/hr over 60 Minutes Intravenous Every 24 hours 04/16/13 1411     04/16/13 2200  metroNIDAZOLE (FLAGYL) IVPB 500 mg     500 mg 100 mL/hr over 60 Minutes Intravenous Every 8 hours 04/16/13 1411     04/15/13 2200  metroNIDAZOLE (FLAGYL) tablet 500 mg  Status:  Discontinued     500 mg Oral 3 times per day 04/15/13 1241 04/16/13 1411   04/15/13 1330  levofloxacin (LEVAQUIN) tablet 500 mg  Status:  Discontinued     500 mg Oral Daily 04/15/13 1322 04/16/13 1411   04/15/13 1200  metroNIDAZOLE (FLAGYL) IVPB 500 mg  Status:  Discontinued     500 mg 100 mL/hr over 60 Minutes  Intravenous Every 8 hours 04/15/13 1125 04/15/13 1241   04/15/13 1000  metroNIDAZOLE (FLAGYL) IVPB 500 mg  Status:  Discontinued     500 mg 100 mL/hr over 60 Minutes Intravenous Every 8 hours 04/15/13 0952 04/15/13 1125   04/15/13 0615  ciprofloxacin (CIPRO) IVPB 400 mg     400 mg 200 mL/hr over 60 Minutes Intravenous  Once 04/15/13 0600 04/15/13 0730      Assessment/Plan: s/p * No surgery found * Adynamic ileus. PICC line was placed this morning and TPN will be initiated. Continue replacement of a lateralized. Hypokalemia is being addressed and replaced and should improve with administration of TPN. Patient's bowel sounds are reassuring and I still strongly feel this is a postoperative adynamic ileus. Continue to minimize narcotic medication.  LOS: 4 days    Timira Bieda C 04/19/2013

## 2013-04-19 NOTE — Progress Notes (Signed)
PARENTERAL NUTRITION CONSULT NOTE - INITIAL  Pharmacy Consult for TPN Indication: prolonged ileus  Allergies  Allergen Reactions  . Penicillins Rash   Patient Measurements: Height: _0  (162.6 cm) Weight: 151 lb 0.2 oz (68.5 kg) IBW/kg (Calculated) : 54.7  Vital Signs: Temp: 98.2 F (36.8 C) (06/04 0631) Temp src: Oral (06/04 0631) BP: 159/66 mmHg (06/04 0631) Pulse Rate: 94 (06/04 0631) Intake/Output from previous day: 06/03 0701 - 06/04 0700 In: 2132.5 [P.O.:120; I.V.:1112.5; IV Piggyback:900] Out: 1525 [Emesis/NG output:1525] Intake/Output from this shift:    Labs:  Recent Labs  04/17/13 0818 04/18/13 0446 04/19/13 0450  WBC 10.0 8.8 10.0  HGB 12.4 11.9* 12.3  HCT 35.0* 32.8* 34.8*  PLT 407* 379 385     Recent Labs  04/17/13 0818 04/18/13 0446 04/19/13 0450  NA 130* 134* 132*  K 2.9* 2.5* 2.5*  CL 89* 92* 90*  CO2 _1 GLUCOSE 223* 206* 193*  BUN 9 7 3*  CREATININE 0.41* 0.41* 0.35*  CALCIUM 8.7 8.4 8.3*   Estimated Creatinine Clearance: 53.3 ml/min (by C-G formula based on Cr of 0.35).    Recent Labs  04/18/13 2057 04/19/13 0743 04/19/13 1132  GLUCAP 157* 185* 172*   Medical History: Past Medical History  Diagnosis Date  . Hemorrhage of gastrointestinal tract, unspecified   . Other malaise and fatigue   . Tobacco use disorder   . Parotid tumor   . Neoplasm of malignant     breast  ,HX   . Diabetes mellitus   . Osteopenia   . Hypertension   . Hyperlipidemia   . Depression   . Anxiety state, unspecified    Medications:  Scheduled:  . docusate sodium  100 mg Oral BID  . enoxaparin (LOVENOX) injection  40 mg Subcutaneous Q24H  . insulin aspart  0-15 Units Subcutaneous Q4H  . levofloxacin (LEVAQUIN) IV  500 mg Intravenous Q24H  . metoprolol  5 mg Intravenous Q6H  . metronidazole  500 mg Intravenous Q8H  . pantoprazole (PROTONIX) IV  40 mg Intravenous QHS  . potassium chloride  10 mEq Intravenous Q1 Hr x 5  . sodium  chloride  10-40 mL Intracatheter Q12H   Insulin Requirements in the past 24 hours:  ~ 10 units from SSI  Current Nutrition:  none  Assessment: 79yo female who underwent laparoscopic appendectomy on 5/29.  Pt returned to [facility] after discharge c/o worsening n/v and abdominal tenderness.  Pt has prolonged post-op ileus and will be started on TPN.  Labs reviewed.  Will replenish K+.  Renal fxn is OK.  Estimated Creatinine Clearance: 53.3 ml/min (by C-G formula based on Cr of 0.35).  Pt has had hyperglycemia and has been receiving sliding scale insulin since admission.  Nutritional Goals:  1700-1900 non-protein kCal, 110-120 grams of protein per day  Plan:  Initiate TPN (Clinimix E 5/15) today at 92m/hr Will add insulin to TPN (15 units per 2 liter bag initially) Provide MVI, Trace Elements, and Lipids on MWF (due to shortage) Modify IVF (LR) to 666mhr and add KCL 40110mLiter Replenish K+ with KCL runs as needed Monitor lytes, renal fxn, fluid status, and glucose tolerance  HalNevada Cranecott A 04/19/2013,1:03 PM

## 2013-04-19 NOTE — Progress Notes (Signed)
Subjective: Patient has less abdominal pain. She has not yet moved her bowel. She has NG tube for suctioning. She is going to be started on TPN.  Objective: Vital signs in last 24 hours: Temp:  [98.2 F (36.8 C)-98.7 F (37.1 C)] 98.2 F (36.8 C) (06/04 0631) Pulse Rate:  [86-98] 94 (06/04 0631) Resp:  [18-20] 18 (06/04 0631) BP: (151-179)/(59-70) 159/66 mmHg (06/04 0631) SpO2:  [96 %-100 %] 96 % (06/04 0631) Weight change:  Last BM Date: 04/10/13  Intake/Output from previous day: 06/03 0701 - 06/04 0700 In: 2132.5 [P.O.:120; I.V.:1112.5; IV Piggyback:900] Out: 1525 [Emesis/NG output:1525]  PHYSICAL EXAM General appearance: alert, fatigued and no distress Resp: diminished breath sounds bilaterally and rhonchi bilaterally Cardio: S1, S2 normal GI: mildly disdended, soft and lax, bowel sound is hypoactive Extremities: extremities normal, atraumatic, no cyanosis or edema  Lab Results:    _0 @ ABGS No results found for this basename: PHART, PCO2, PO2ART, TCO2, HCO3,  in the last 72 hours CULTURES Recent Results (from the past 240 hour(s))  URINE CULTURE     Status: None   Collection Time    04/13/13  1:22 AM      Result Value Range Status   Specimen Description URINE, CLEAN CATCH   Final   Special Requests NONE   Final   Culture  Setup Time 04/13/2013 14:37   Final   Colony Count 65,000 COLONIES/ML   Final   Culture     Final   Value: Multiple bacterial morphotypes present, none predominant. Suggest appropriate recollection if clinically indicated.   Report Status 04/14/2013 FINAL   Final  SURGICAL PCR SCREEN     Status: None   Collection Time    04/13/13  9:45 AM      Result Value Range Status   MRSA, PCR NEGATIVE  NEGATIVE Final   Staphylococcus aureus NEGATIVE  NEGATIVE Final   Comment:            The Xpert SA Assay (FDA     approved for NASAL specimens     in patients over 105 years of age),     is one component of     a comprehensive surveillance      program.  Test performance has     been validated by [employer] for patients greater     than or equal to 62 year old.     It is not intended     to diagnose infection nor to     guide or monitor treatment.  URINE CULTURE     Status: None   Collection Time    04/15/13  5:27 AM      Result Value Range Status   Specimen Description URINE, CLEAN CATCH   Final   Special Requests NONE   Final   Culture  Setup Time 04/15/2013 18:26   Final   Colony Count NO GROWTH   Final   Culture NO GROWTH   Final   Report Status 04/16/2013 FINAL   Final   Studies/Results: Ct Abdomen Pelvis W Contrast  04/17/2013   *RADIOLOGY REPORT*  Clinical Data: Abdominal pain and bloating.  Appendectomy 05/29.  CT ABDOMEN AND PELVIS WITH CONTRAST  Technique:  Multidetector CT imaging of the abdomen and pelvis was performed following the standard protocol during bolus administration of intravenous contrast.  Contrast: 85m OMNIPAQUE IOHEXOL 300 MG/ML  SOLN, 1052mOMNIPAQUE IOHEXOL 300 MG/ML  SOLN  Comparison: 04/13/2013.  Findings: Lung Bases: Small  bilateral pleural effusions.  Collapse / consolidation of the lower lobes, greater on the right than left. Coronary artery atherosclerosis is present. If office based assessment of coronary risk factors has not been performed, it is now recommended.  Liver:  Mild postcholecystectomy intrahepatic biliary ductal dilation.  No mass lesions.  Spleen:  Normal.  Gallbladder:  Surgically absent.  Common bile duct:  Normal.  No calcified stones.  Pancreas:  Fatty atrophy of the tail and distal body of the pancreas.  Prominence of the distal pancreatic duct.  Adrenal glands:  Normal.  Kidneys:  Tiny low density lesions in the left kidney likely representing cysts but too small to definitively characterize. There is subtle decreased attenuation of the right inferior renal pole however this is favored to represent beam hardening artifact from the adjacent colon which is filled with oral  contrast.  13 mm interpolar right renal cystic lesion is unchanged, previously described.  Stomach:  Small hiatal hernia.  Nasogastric tube terminates in the proximal gastric fundus.  Mild gaseous distention.  Small bowel:  There is marked dilation of small bowel extending into the anatomic pelvis.  The decompressed terminal ileum is present.  Transition point is not identified however one is suspected in the right lower quadrant where there is a fluid-filled small bowel and adjacent decompressed terminal ileum.  Colon:   Decompressed.  Appendix has been resected.  Contrast is present within the proximal colon.  Pelvic Genitourinary:  Urinary bladder distended.  Moderate amount of fluid in the anatomic pelvis which demonstrates intermediate attenuation and dependently and low attenuation anteriorly.  This suggests a complex fluid which may represent postoperative using/hemorrhage.  Free or infected fluid cannot be excluded. Hysterectomy.  Bones:   No aggressive osseous lesions.  Osteopenia.  Vasculature: Atherosclerosis.  No aneurysm or acute abnormality.  Body Wall: Mild anasarca changes.  Expected postoperative gas in the anterior body wall.  Moderate volume ascites.  IMPRESSION: 1.  Small bowel obstruction.  The decompressed terminal ileum and relative decompression of the colon makes this more consistent with obstruction and ileus. 2.  Moderate volume of ascites with debris or complex fluid in the anatomic pelvis. 3.  Cholecystectomy. 4.  Low density renal lesions, probably representing cysts.  Right interpolar lesion is indeterminant. 5.  Bilateral pleural effusions with collapse / consolidation of right greater than left lower lobes.   Original Report Authenticated By: Dereck Ligas, M.D.    Medications: I have reviewed the patient's current medications.  Assesment: 1. Post-op ileus 2. Hypokalemia 3. S/P appendectomy 4. DM type II 5. Hypertension Active Problems:   * No active hospital problems.  *    Plan: Continue iv hydration K+ supplement Will monitor BMP TPN as planned.    LOS: 4 days   Chibuikem Thang 04/19/2013, 8:19 AM

## 2013-04-20 LAB — DIFFERENTIAL
Basophils Absolute: 0.1 10*3/uL (ref 0.0–0.1)
Basophils Relative: 1 % (ref 0–1)
Eosinophils Absolute: 0.2 10*3/uL (ref 0.0–0.7)
Eosinophils Relative: 2 % (ref 0–5)
Lymphocytes Relative: 14 % (ref 12–46)
Lymphs Abs: 1.4 10*3/uL (ref 0.7–4.0)
Monocytes Absolute: 1.1 10*3/uL — ABNORMAL HIGH (ref 0.1–1.0)
Monocytes Relative: 11 % (ref 3–12)
Neutro Abs: 7.2 10*3/uL (ref 1.7–7.7)
Neutrophils Relative %: 72 % (ref 43–77)

## 2013-04-20 LAB — COMPREHENSIVE METABOLIC PANEL
ALT: 9 U/L (ref 0–35)
AST: 16 U/L (ref 0–37)
Albumin: 2.5 g/dL — ABNORMAL LOW (ref 3.5–5.2)
Alkaline Phosphatase: 85 U/L (ref 39–117)
BUN: 3 mg/dL — ABNORMAL LOW (ref 6–23)
CO2: 29 mEq/L (ref 19–32)
Calcium: 8.1 mg/dL — ABNORMAL LOW (ref 8.4–10.5)
Chloride: 91 mEq/L — ABNORMAL LOW (ref 96–112)
Creatinine, Ser: 0.35 mg/dL — ABNORMAL LOW (ref 0.50–1.10)
GFR calc Af Amer: >90 mL/min (ref >90)
GFR calc non Af Amer: >90 mL/min (ref >90)
Glucose, Bld: 223 mg/dL — ABNORMAL HIGH (ref 70–99)
Potassium: 3 mEq/L — ABNORMAL LOW (ref 3.5–5.1)
Sodium: 130 mEq/L — ABNORMAL LOW (ref 135–145)
Total Bilirubin: 0.4 mg/dL (ref 0.3–1.2)
Total Protein: 5.8 g/dL — ABNORMAL LOW (ref 6.0–8.3)

## 2013-04-20 LAB — CBC
HCT: 33.6 % — ABNORMAL LOW (ref 36.0–46.0)
Hemoglobin: 11.7 g/dL — ABNORMAL LOW (ref 12.0–15.0)
MCH: 32.9 pg (ref 26.0–34.0)
MCHC: 34.8 g/dL (ref 30.0–36.0)
MCV: 94.4 fL (ref 78.0–100.0)
Platelets: 409 10*3/uL — ABNORMAL HIGH (ref 150–400)
RBC: 3.56 MIL/uL — ABNORMAL LOW (ref 3.87–5.11)
RDW: 13.2 % (ref 11.5–15.5)
WBC: 10 10*3/uL (ref 4.0–10.5)

## 2013-04-20 LAB — GLUCOSE, CAPILLARY
Glucose-Capillary: 207 mg/dL — ABNORMAL HIGH (ref 70–99)
Glucose-Capillary: 184 mg/dL — ABNORMAL HIGH (ref 70–99)
Glucose-Capillary: 232 mg/dL — ABNORMAL HIGH (ref 70–99)
Glucose-Capillary: 203 mg/dL — ABNORMAL HIGH (ref 70–99)
Glucose-Capillary: 174 mg/dL — ABNORMAL HIGH (ref 70–99)

## 2013-04-20 LAB — CHOLESTEROL, TOTAL: Cholesterol: 83 mg/dL (ref 0–200)

## 2013-04-20 LAB — TRIGLYCERIDES: Triglycerides: 85 mg/dL (ref <150)

## 2013-04-20 LAB — PREALBUMIN: Prealbumin: 8 mg/dL — ABNORMAL LOW (ref 17.0–34.0)

## 2013-04-20 LAB — MAGNESIUM: Magnesium: 1.4 mg/dL — ABNORMAL LOW (ref 1.5–2.5)

## 2013-04-20 LAB — PHOSPHORUS: Phosphorus: 2.3 mg/dL (ref 2.3–4.6)

## 2013-04-20 MED ORDER — KETOROLAC TROMETHAMINE 30 MG/ML IJ SOLN
15.0000 mg | Freq: Four times a day (QID) | INTRAMUSCULAR | Status: AC
  Administered 2013-04-20 – 2013-04-24 (×16): 15 mg via INTRAVENOUS
  Filled 2013-04-20 (×16): qty 1

## 2013-04-20 MED ORDER — SODIUM CHLORIDE 0.9 % IV SOLN
250.0000 mg | Freq: Three times a day (TID) | INTRAVENOUS | Status: DC
  Administered 2013-04-20 – 2013-04-24 (×12): 250 mg via INTRAVENOUS
  Filled 2013-04-20 (×16): qty 250

## 2013-04-20 MED ORDER — POTASSIUM CHLORIDE 10 MEQ/100ML IV SOLN
10.0000 meq | INTRAVENOUS | Status: AC
  Administered 2013-04-20 (×5): 10 meq via INTRAVENOUS
  Filled 2013-04-20 (×6): qty 100

## 2013-04-20 MED ORDER — POTASSIUM CHLORIDE 2 MEQ/ML IV SOLN
INTRAVENOUS | Status: AC
  Filled 2013-04-20 (×4): qty 1000

## 2013-04-20 MED ORDER — CLINIMIX E/DEXTROSE (5/15) 5 % IV SOLN
INTRAVENOUS | Status: AC
  Administered 2013-04-20: 17:00:00 via INTRAVENOUS
  Filled 2013-04-20: qty 2000

## 2013-04-20 MED ORDER — MAGNESIUM SULFATE 40 MG/ML IJ SOLN
2.0000 g | Freq: Once | INTRAMUSCULAR | Status: AC
  Administered 2013-04-20: 2 g via INTRAVENOUS
  Filled 2013-04-20: qty 50

## 2013-04-20 MED ORDER — POTASSIUM CHLORIDE 10 MEQ/100ML IV SOLN
10.0000 meq | INTRAVENOUS | Status: DC
  Administered 2013-04-20: 10 meq via INTRAVENOUS
  Filled 2013-04-20: qty 100

## 2013-04-20 NOTE — Progress Notes (Signed)
[place] NOTE  Pharmacy Consult for TPN Indication: prolonged ileus  Allergies  Allergen Reactions  . Penicillins Rash   Patient Measurements: Height: 5' 4" (162.6 cm) Weight: 156 lb 8.4 oz (71 kg) IBW/kg (Calculated) : 54.7  Vital Signs: Temp: 98.4 F (36.9 C) (06/05 0437) Temp src: Oral (06/05 0437) BP: 158/108 mmHg (06/05 0437) Pulse Rate: 102 (06/05 0437) Intake/Output from previous day: 06/04 0701 - 06/05 0700 In: 0  Out: 700 [Emesis/NG output:700] Intake/Output from this shift:    Labs:  Recent Labs  04/18/13 0446 04/19/13 0450 04/20/13 0451  WBC 8.8 10.0 10.0  HGB 11.9* 12.3 11.7*  HCT 32.8* 34.8* 33.6*  PLT 379 385 409*     Recent Labs  04/19/13 0450 04/19/13 1829 04/20/13 0451  NA 132* 132* 130*  K 2.5* 3.0* 3.0*  CL 90* 90* 91*  CO2 _0 GLUCOSE 193* 132* 223*  BUN 3* <3* 3*  CREATININE 0.35* 0.40* 0.35*  CALCIUM 8.3* 8.4 8.1*  MG  --   --  1.4*  PHOS  --   --  2.3  PROT  --   --  5.8*  ALBUMIN  --   --  2.5*  AST  --   --  16  ALT  --   --  9  ALKPHOS  --   --  85  BILITOT  --   --  0.4  TRIG  --   --  85  CHOL  --   --  83   Estimated Creatinine Clearance: 54.2 ml/min (by C-G formula based on Cr of 0.35).    Recent Labs  04/19/13 2357 04/20/13 0432 04/20/13 0743  GLUCAP 183* 207* 184*   Medical History: Past Medical History  Diagnosis Date  . Hemorrhage of gastrointestinal tract, unspecified   . Other malaise and fatigue   . Tobacco use disorder   . Parotid tumor   . Neoplasm of malignant     breast  ,HX   . Diabetes mellitus   . Osteopenia   . Hypertension   . Hyperlipidemia   . Depression   . Anxiety state, unspecified    Medications:  Scheduled:  . docusate sodium  100 mg Oral BID  . enoxaparin (LOVENOX) injection  40 mg Subcutaneous Q24H  . insulin aspart  0-15 Units Subcutaneous Q4H  . levofloxacin (LEVAQUIN) IV  500 mg Intravenous Q24H  . magnesium sulfate 1 - 4 g bolus IVPB  2  g Intravenous Once  . metoprolol  5 mg Intravenous Q6H  . metronidazole  500 mg Intravenous Q8H  . pantoprazole (PROTONIX) IV  40 mg Intravenous QHS  . potassium chloride  10 mEq Intravenous Q1 Hr x 6  . sodium chloride  10-40 mL Intracatheter Q12H   Insulin Requirements in the past 24 hours:  ~ 15 units from SSI  Current Nutrition:  none  Assessment: 79yo female who underwent laparoscopic appendectomy on 5/29.  Pt returned to [facility] after discharge c/o worsening n/v and abdominal tenderness.  Pt has prolonged post-op ileus and has been started on TPN.   Labs reviewed.  Potassium is still low despite K+runs x6 and K+ added to IVF.  Magnesium is also low today.  Will replace. Renal fxn remains unchanged. Pt has hx of DM and has been receiving sliding scale insulin since admission.  Her CBGs are elevated since starting TPN despite addition of insulin.  Will increase insulin in TPN.   Nutritional Goals:  1700-1900  non-protein kCal, 110-120 grams of protein per day  Plan:  Increase TPN (Clinimix E 5/15) to 40m/hr Will increase insulin to 30 units per 2 liter bag of TPN Provide MVI, Trace Elements, and Lipids on MWF (due to shortage) Modify IVF (LR) to 433mhr and add KCL 4072mLiter Replenish K+ with KCL runs x6 Replenish Magnesium with 2gm IVPB x1 Monitor lytes, renal fxn, fluid status, and glucose   LilBiagio Borg5/2014,10:02 AM

## 2013-04-20 NOTE — Progress Notes (Signed)
Subjective: Patient is started on TPN. She has no bowel movement. Continue to have abdominal pain intermittently.  Objective: Vital signs in last 24 hours: Temp:  [97.8 F (36.6 C)-98.5 F (36.9 C)] 98.4 F (36.9 C) (06/05 0437) Pulse Rate:  [92-103] 102 (06/05 0437) Resp:  [18-20] 20 (06/05 0437) BP: (158)/(68-108) 158/108 mmHg (06/05 0437) SpO2:  [96 %-98 %] 97 % (06/05 0437) Weight:  [71 kg (156 lb 8.4 oz)] 71 kg (156 lb 8.4 oz) (06/05 0437) Weight change:  Last BM Date: 04/10/13  Intake/Output from previous day: 06/04 0701 - 06/05 0700 In: 0  Out: 700 [Emesis/NG output:700]  PHYSICAL EXAM General appearance: alert, fatigued and no distress Resp: diminished breath sounds bilaterally and rhonchi bilaterally Cardio: S1, S2 normal GI: mildly disdended, soft and lax, bowel sound is hypoactive Extremities: extremities normal, atraumatic, no cyanosis or edema  Lab Results:    _0 @ ABGS No results found for this basename: PHART, PCO2, PO2ART, TCO2, HCO3,  in the last 72 hours CULTURES Recent Results (from the past 240 hour(s))  URINE CULTURE     Status: None   Collection Time    04/13/13  1:22 AM      Result Value Range Status   Specimen Description URINE, CLEAN CATCH   Final   Special Requests NONE   Final   Culture  Setup Time 04/13/2013 14:37   Final   Colony Count 65,000 COLONIES/ML   Final   Culture     Final   Value: Multiple bacterial morphotypes present, none predominant. Suggest appropriate recollection if clinically indicated.   Report Status 04/14/2013 FINAL   Final  SURGICAL PCR SCREEN     Status: None   Collection Time    04/13/13  9:45 AM      Result Value Range Status   MRSA, PCR NEGATIVE  NEGATIVE Final   Staphylococcus aureus NEGATIVE  NEGATIVE Final   Comment:            The Xpert SA Assay (FDA     approved for NASAL specimens     in patients over 6 years of age),     is one component of     a comprehensive surveillance     program.   Test performance has     been validated by [employer] for patients greater     than or equal to 44 year old.     It is not intended     to diagnose infection nor to     guide or monitor treatment.  URINE CULTURE     Status: None   Collection Time    04/15/13  5:27 AM      Result Value Range Status   Specimen Description URINE, CLEAN CATCH   Final   Special Requests NONE   Final   Culture  Setup Time 04/15/2013 18:26   Final   Colony Count NO GROWTH   Final   Culture NO GROWTH   Final   Report Status 04/16/2013 FINAL   Final   Studies/Results: Dg Chest Port 1 View  04/19/2013   *RADIOLOGY REPORT*  Clinical Data: PICC placement.  PORTABLE CHEST - 1 VIEW  Comparison: Single view of the chest 04/15/2013.  Findings: Right PICC is in place with the tip projecting over the mid superior vena cava.  NG tube tip is in the stomach.  Lungs are emphysematous.  No pneumothorax.  There has been some increase in right greater than  left pleural effusions and basilar airspace disease.  IMPRESSION:  1.  Tip of right PICC projects over the mid superior vena cava. 2.  Slight increase in small bilateral pleural effusions and basilar airspace disease, worse on the right.   Original Report Authenticated By: Orlean Patten, M.D.    Medications: I have reviewed the patient's current medications.  Assesment: 1. Post-op ileus 2. Hypokalemia 3. S/P appendectomy 4. DM type II 5. Hypertension Active Problems:   * No active hospital problems. *    Plan: Continue iv hydration K+ supplement Continue TPN Continue to monitor electrolytes   LOS: 5 days   Eulogio Requena 04/20/2013, 7:44 AM

## 2013-04-20 NOTE — Progress Notes (Signed)
INITIAL NUTRITION ASSESSMENT  DOCUMENTATION CODES Per approved criteria  -Not Applicable   INTERVENTION: Patient is receiving TPN with Clinimix E 5/15 @ 50 ml/hr.  Lipids (20% IVFE @ 10 ml/hr), multivitamins, and trace elements are provided 3 times weekly (MWF) due to national backorder.  Provides 1057 kcal and 60 grams protein daily (based on weekly average).  Meets 70% minimum estimated kcal and 60% minimum estimated protein needs.  Additional IVF with lactated ringers @ 60 ml/hr.  NUTRITION DIAGNOSIS: Inadequate oral intake related to altered GI function as evidenced by  NPO/Clear liquid status and post-op ileus  Goal: Pt to meet >/= 90% of their estimated nutrition needs  Monitor: Nutrition support measures and transition to oral intake  Reason for Assessment: New TPN  79 y.o. female  Admitting Dx:  ASSESSMENT: Pt has NG 16 fr left nare.TPN initiated Clinimix 5/15 adv to 50 ml/hr next bag. 20% Intralipids @ 10 ml/hr (MWF). Pt wt is signficantly above usual likely related to fluid status. Her daughters are present and report pt wt had been stable and appetite good prior to acute illness which started 2 weeks prior to admission.  Height: Ht Readings from Last 1 Encounters:  04/15/13 _0  (1.626 m)    Weight: Wt Readings from Last 1 Encounters:  04/20/13 156 lb 8.4 oz (71 kg)    Ideal Body Weight: 120# (54.5 kg)  % Ideal Body Weight: 130%  Wt Readings from Last 10 Encounters:  04/20/13 156 lb 8.4 oz (71 kg)  04/13/13 148 lb (67.132 kg)  04/13/13 148 lb (67.132 kg)  02/16/13 148 lb (67.132 kg)  11/27/11 149 lb 3.2 oz (67.677 kg)  10/21/11 144 lb (65.318 kg)  10/30/10 145 lb (65.772 kg)  04/15/10 141 lb (63.957 kg)  08/06/09 137 lb (62.143 kg)  05/07/09 134 lb (60.782 kg)    Usual Body Weight: 140-145#   % Usual Body Weight: 108%  BMI:  Body mass index is 26.85 kg/(m^2). overweight  Estimated Nutritional Needs: Kcal: 2353-6144 Protein: 100-114  gr Fluid: per MD goals  Skin: abdominal incision  Diet Order: Clear Liquid  EDUCATION NEEDS: -Education needs addressed   Intake/Output Summary (Last 24 hours) at 04/20/13 1111 Last data filed at 04/20/13 0630  Gross per 24 hour  Intake      0 ml  Output    700 ml  Net   -700 ml    Last BM: 04/10/13 abdomen distended  Labs:   Recent Labs Lab 04/15/13 0312 04/16/13 0452  04/19/13 0450 04/19/13 1829 04/20/13 0451  NA 129* 131*  < > 132* 132* 130*  K 3.0* 2.8*  < > 2.5* 3.0* 3.0*  CL 87* 88*  < > 90* 90* 91*  CO2 27 29  < > _1 BUN 10 10  < > 3* <3* 3*  CREATININE 0.51 0.44*  < > 0.35* 0.40* 0.35*  CALCIUM 9.2 9.1  < > 8.3* 8.4 8.1*  MG  --  1.7  --   --   --  1.4*  PHOS  --   --   --   --   --  2.3  GLUCOSE 259* 261*  < > 193* 132* 223*  < > = values in this interval not displayed.  CBG (last 3)   Recent Labs  04/19/13 2357 04/20/13 0432 04/20/13 0743  GLUCAP 183* 207* 184*    Scheduled Meds: . docusate sodium  100 mg Oral BID  . enoxaparin (LOVENOX) injection  40 mg Subcutaneous Q24H  . insulin aspart  0-15 Units Subcutaneous Q4H  . levofloxacin (LEVAQUIN) IV  500 mg Intravenous Q24H  . magnesium sulfate 1 - 4 g bolus IVPB  2 g Intravenous Once  . metoprolol  5 mg Intravenous Q6H  . metronidazole  500 mg Intravenous Q8H  . pantoprazole (PROTONIX) IV  40 mg Intravenous QHS  . potassium chloride  10 mEq Intravenous Q1 Hr x 6  . sodium chloride  10-40 mL Intracatheter Q12H    Continuous Infusions: . Marland KitchenTPN (CLINIMIX-E) Adult 40 mL/hr at 04/19/13 1850   And  . fat emulsion 250 mL (04/19/13 1850)  . Marland KitchenTPN (CLINIMIX-E) Adult    . lactated ringers 1,000 mL with potassium chloride 40 mEq infusion 60 mL/hr at 04/19/13 1800  . lactated ringers 1,000 mL with potassium chloride 40 mEq infusion      Past Medical History  Diagnosis Date  . Hemorrhage of gastrointestinal tract, unspecified   . Other malaise and fatigue   . Tobacco use disorder   .  Parotid tumor   . Neoplasm of malignant     breast  ,HX   . Diabetes mellitus   . Osteopenia   . Hypertension   . Hyperlipidemia   . Depression   . Anxiety state, unspecified     Past Surgical History  Procedure Laterality Date  . Mastectomy  1994    left breast -related to breast cancer  . Vesicovaginal fistula closure w/ tah  1979  . Cholecystectomy  2008  . Hemorrhoid surgery  1960s  . Laparoscopic appendectomy N/A 04/13/2013    Procedure: APPENDECTOMY LAPAROSCOPIC;  Surgeon: Donato Heinz, MD;  Location: [facility];  Service: General;  Laterality: N/A;    Colman Cater MS,RD,LDN,CSG Office: [phone] Pager: [phone]

## 2013-04-20 NOTE — Progress Notes (Signed)
Subjective: No fever or chills. No significant change. Sparse flatus. No bowel movement  Objective: Vital signs in last 24 hours: Temp:  [97.8 F (36.6 C)-98.4 F (36.9 C)] 97.9 F (36.6 C) (06/05 1520) Pulse Rate:  [92-102] 93 (06/05 1520) Resp:  [18-20] 20 (06/05 1520) BP: (138-158)/(77-108) 138/98 mmHg (06/05 1520) SpO2:  [97 %-99 %] 99 % (06/05 1520) Weight:  [71 kg (156 lb 8.4 oz)] 71 kg (156 lb 8.4 oz) (06/05 0437) Last BM Date: 04/10/13  Intake/Output from previous day: 06/04 0701 - 06/05 0700 In: 0  Out: 700 [Emesis/NG output:700] Intake/Output this shift:    General appearance: alert and no distress Resp: clear to auscultation bilaterally Cardio: regular rate and rhythm GI: Sparse bowel sounds. Soft. Distended. Mild abdominal tenderness. Incisions are clean dry and intact   Lab Results:   Recent Labs  04/19/13 0450 04/20/13 0451  WBC 10.0 10.0  HGB 12.3 11.7*  HCT 34.8* 33.6*  PLT 385 409*   BMET  Recent Labs  04/19/13 1829 04/20/13 0451  NA 132* 130*  K 3.0* 3.0*  CL 90* 91*  CO2 28 29  GLUCOSE 132* 223*  BUN <3* 3*  CREATININE 0.40* 0.35*  CALCIUM 8.4 8.1*   PT/INR No results found for this basename: LABPROT, INR,  in the last 72 hours ABG No results found for this basename: PHART, PCO2, PO2, HCO3,  in the last 72 hours  Studies/Results: Dg Chest Port 1 View  04/19/2013   *RADIOLOGY REPORT*  Clinical Data: PICC placement.  PORTABLE CHEST - 1 VIEW  Comparison: Single view of the chest 04/15/2013.  Findings: Right PICC is in place with the tip projecting over the mid superior vena cava.  NG tube tip is in the stomach.  Lungs are emphysematous.  No pneumothorax.  There has been some increase in right greater than left pleural effusions and basilar airspace disease.  IMPRESSION:  1.  Tip of right PICC projects over the mid superior vena cava. 2.  Slight increase in small bilateral pleural effusions and basilar airspace disease, worse on the  right.   Original Report Authenticated By: Orlean Patten, M.D.    Anti-infectives: Anti-infectives   Start     Dose/Rate Route Frequency Ordered Stop   04/20/13 1500  erythromycin 250 mg in sodium chloride 0.9 % 100 mL IVPB     250 mg 100 mL/hr over 60 Minutes Intravenous 3 times per day 04/20/13 1351     04/17/13 1000  levofloxacin (LEVAQUIN) IVPB 500 mg  Status:  Discontinued     500 mg 100 mL/hr over 60 Minutes Intravenous Every 24 hours 04/16/13 1411 04/20/13 1351   04/16/13 2200  metroNIDAZOLE (FLAGYL) IVPB 500 mg  Status:  Discontinued     500 mg 100 mL/hr over 60 Minutes Intravenous Every 8 hours 04/16/13 1411 04/20/13 1351   04/15/13 2200  metroNIDAZOLE (FLAGYL) tablet 500 mg  Status:  Discontinued     500 mg Oral 3 times per day 04/15/13 1241 04/16/13 1411   04/15/13 1330  levofloxacin (LEVAQUIN) tablet 500 mg  Status:  Discontinued     500 mg Oral Daily 04/15/13 1322 04/16/13 1411   04/15/13 1200  metroNIDAZOLE (FLAGYL) IVPB 500 mg  Status:  Discontinued     500 mg 100 mL/hr over 60 Minutes Intravenous Every 8 hours 04/15/13 1125 04/15/13 1241   04/15/13 1000  metroNIDAZOLE (FLAGYL) IVPB 500 mg  Status:  Discontinued     500 mg 100 mL/hr over 60 Minutes  Intravenous Every 8 hours 04/15/13 0952 04/15/13 1125   04/15/13 0615  ciprofloxacin (CIPRO) IVPB 400 mg     400 mg 200 mL/hr over 60 Minutes Intravenous  Once 04/15/13 0600 04/15/13 0730      Assessment/Plan: s/p * No surgery found * Persistent adynamic ileus. Tolerating TPN. Electrolytes are normalizing. No evidence of persistent infection. Levaquin and Flagyl have been discontinued. All narcotics had been discontinued as possible. Patient has been started on Toradol. Sham feeding with chewiing gum aministration has been attempted with no significant change.  Continue to ambulate. Will plan small bowel follow-through tomorrow if still no change to rule out mechanical etiology.  LOS: 5 days    [name]  C 04/20/2013

## 2013-04-21 ENCOUNTER — Inpatient Hospital Stay (HOSPITAL_COMMUNITY): Payer: Medicare Other

## 2013-04-21 LAB — MAGNESIUM: Magnesium: 1.8 mg/dL (ref 1.5–2.5)

## 2013-04-21 LAB — GLUCOSE, CAPILLARY
Glucose-Capillary: 124 mg/dL — ABNORMAL HIGH (ref 70–99)
Glucose-Capillary: 132 mg/dL — ABNORMAL HIGH (ref 70–99)
Glucose-Capillary: 136 mg/dL — ABNORMAL HIGH (ref 70–99)
Glucose-Capillary: 165 mg/dL — ABNORMAL HIGH (ref 70–99)
Glucose-Capillary: 210 mg/dL — ABNORMAL HIGH (ref 70–99)
Glucose-Capillary: 211 mg/dL — ABNORMAL HIGH (ref 70–99)

## 2013-04-21 LAB — BASIC METABOLIC PANEL
BUN: 8 mg/dL (ref 6–23)
CO2: 32 mEq/L (ref 19–32)
Calcium: 8.3 mg/dL — ABNORMAL LOW (ref 8.4–10.5)
Chloride: 94 mEq/L — ABNORMAL LOW (ref 96–112)
Creatinine, Ser: 0.37 mg/dL — ABNORMAL LOW (ref 0.50–1.10)
GFR calc Af Amer: >90 mL/min (ref >90)
GFR calc non Af Amer: >90 mL/min (ref >90)
Glucose, Bld: 154 mg/dL — ABNORMAL HIGH (ref 70–99)
Potassium: 3.7 mEq/L (ref 3.5–5.1)
Sodium: 131 mEq/L — ABNORMAL LOW (ref 135–145)

## 2013-04-21 LAB — CBC
HCT: 32.4 % — ABNORMAL LOW (ref 36.0–46.0)
Hemoglobin: 11.4 g/dL — ABNORMAL LOW (ref 12.0–15.0)
MCH: 33.3 pg (ref 26.0–34.0)
MCHC: 35.2 g/dL (ref 30.0–36.0)
MCV: 94.7 fL (ref 78.0–100.0)
Platelets: 385 10*3/uL (ref 150–400)
RBC: 3.42 MIL/uL — ABNORMAL LOW (ref 3.87–5.11)
RDW: 13.4 % (ref 11.5–15.5)
WBC: 7.6 10*3/uL (ref 4.0–10.5)

## 2013-04-21 MED ORDER — SODIUM CHLORIDE 0.9 % IV SOLN
INTRAVENOUS | Status: DC
  Filled 2013-04-21 (×3): qty 1000

## 2013-04-21 MED ORDER — TRACE MINERALS CR-CU-F-FE-I-MN-MO-SE-ZN IV SOLN
INTRAVENOUS | Status: AC
  Administered 2013-04-21: 18:00:00 via INTRAVENOUS
  Filled 2013-04-21: qty 2000

## 2013-04-21 MED ORDER — POTASSIUM CHLORIDE IN NACL 40-0.9 MEQ/L-% IV SOLN
INTRAVENOUS | Status: DC
  Administered 2013-04-21: 17:00:00 via INTRAVENOUS
  Administered 2013-04-23: 20 mL/h via INTRAVENOUS

## 2013-04-21 MED ORDER — FAT EMULSION 20 % IV EMUL
250.0000 mL | INTRAVENOUS | Status: AC
  Administered 2013-04-21: 250 mL via INTRAVENOUS
  Filled 2013-04-21: qty 250

## 2013-04-21 NOTE — Progress Notes (Signed)
Subjective: Feels about the same. No significant increase in nausea. Patient states she is passing some flatus. No bowel movement.  Objective: Vital signs in last 24 hours: Temp:  [97.8 F (36.6 Chambers)-98 F (36.7 Chambers)] 98 F (36.7 Chambers) April 28, 2023 0651) Pulse Rate:  [83-93] 83 Apr 28, 2023 0651) Resp:  [18-20] 18 04-28-2023 0651) BP: (138-158)/(70-98) 153/70 mmHg 04-28-23 0651) SpO2:  [99 %-100 %] 100 % 04-28-2023 0720) Last BM Date: 04/10/13  Intake/Output from previous day: 06/05 0701 - 04/28/23 0700 In: 2528.7 [I.V.:514.7; IV Piggyback:300; ZOX:0960] Out: -  Intake/Output this shift: Total I/O In: 20 [I.V.:20] Out: 100 [Emesis/NG output:100]  General appearance: alert and no distress Resp: clear to auscultation bilaterally Cardio: regular rate and rhythm GI: Intermittent bowel sounds still somewhat sparse. Abdomen is soft, distended, mild tenderness. Incisions are clean dry and intact. No peritoneal signs.  Lab Results:   Recent Labs  04/20/13 0451 04/27/2013 0450  WBC 10.0 7.6  HGB 11.7* 11.4*  HCT 33.6* 32.4*  PLT 409* 385   BMET  Recent Labs  04/20/13 0451 04/27/13 1017  NA 130* 131*  K 3.0* 3.7  CL 91* 94*  CO2 29 32  GLUCOSE 223* 154*  BUN 3* 8  CREATININE 0.35* 0.37*  CALCIUM 8.1* 8.3*   PT/INR No results found for this basename: LABPROT, INR,  in the last 72 hours ABG No results found for this basename: PHART, PCO2, PO2, HCO3,  in the last 72 hours  Studies/Results: Dg Abd 2 Views  2013-04-27   *RADIOLOGY REPORT*  Clinical Data:  ABDOMEN - 2 VIEW  Comparison: CT abdomen pelvis dated 04/17/2013  Findings: Gaseous distension of multiple loops of small and large bowel, compatible with reported history of adynamic ileus.  Residual contrast within the right colon.  Enteric tube terminates in the gastric body.  Cholecystectomy clips.  IMPRESSION: Gaseous distension of multiple loops of small and large bowel, compatible with reported history of adynamic ileus.   Original Report  Authenticated By: Julian Hy, M.D.    Anti-infectives: Anti-infectives   Start     Dose/Rate Route Frequency Ordered Stop   04/20/13 1500  erythromycin 250 mg in sodium chloride 0.9 % 100 mL IVPB     250 mg 100 mL/hr over 60 Minutes Intravenous 3 times per day 04/20/13 1351     04/17/13 1000  levofloxacin (LEVAQUIN) IVPB 500 mg  Status:  Discontinued     500 mg 100 mL/hr over 60 Minutes Intravenous Every 24 hours 04/16/13 1411 04/20/13 1351   04/16/13 2200  metroNIDAZOLE (FLAGYL) IVPB 500 mg  Status:  Discontinued     500 mg 100 mL/hr over 60 Minutes Intravenous Every 8 hours 04/16/13 1411 04/20/13 1351   04/15/13 2200  metroNIDAZOLE (FLAGYL) tablet 500 mg  Status:  Discontinued     500 mg Oral 3 times per day 04/15/13 1241 04/16/13 1411   04/15/13 1330  levofloxacin (LEVAQUIN) tablet 500 mg  Status:  Discontinued     500 mg Oral Daily 04/15/13 1322 04/16/13 1411   04/15/13 1200  metroNIDAZOLE (FLAGYL) IVPB 500 mg  Status:  Discontinued     500 mg 100 mL/hr over 60 Minutes Intravenous Every 8 hours 04/15/13 1125 04/15/13 1241   04/15/13 1000  metroNIDAZOLE (FLAGYL) IVPB 500 mg  Status:  Discontinued     500 mg 100 mL/hr over 60 Minutes Intravenous Every 8 hours 04/15/13 0952 04/15/13 1125   04/15/13 0615  ciprofloxacin (CIPRO) IVPB 400 mg     400 mg 200  mL/hr over 60 Minutes Intravenous  Once 04/15/13 0600 04/15/13 0730      Assessment/Plan: s/p * No surgery found * Adynamic ileus. At this point continue patient TPN. Continue monitoring electrolytes. Continue erythromycin for now for small bowel motility. Continue to ambulate. Continue sham feedings with chewing gum. Reassured patient and family. Small bowel follow-through demonstrated contrast and air within the colon which would go against a mechanical bowel obstruction. Small bowel follow-through is not continued due to the presence of persistent contrast in the right colon.  LOS: 6 days    [name] 04/21/2013

## 2013-04-21 NOTE — Progress Notes (Signed)
Subjective: Patient feels better. She is passing gas. No nausea, vomiting or abdominal pain. She is receiving TPN. No new complaint. Objective: Vital signs in last 24 hours: Temp:  [97.8 F (36.6 C)-98 F (36.7 C)] 98 F (36.7 C) (06/06 0651) Pulse Rate:  [83-93] 83 (06/06 0651) Resp:  [18-20] 18 (06/06 0651) BP: (138-158)/(70-98) 153/70 mmHg (06/06 0651) SpO2:  [97 %-100 %] 100 % (06/06 0720) Weight change:  Last BM Date: 04/10/13  Intake/Output from previous day: 06/05 0701 - 06/06 0700 In: 2528.7 [I.V.:514.7; IV Piggyback:300; TPN:1714] Out: -   PHYSICAL EXAM General appearance: alert and no distress Resp: diminished breath sounds bilaterally and rhonchi bilaterally Cardio: S1, S2 normal GI: mildly disdended, soft and lax, bowel sound ++ Extremities: extremities normal, atraumatic, no cyanosis or edema  Lab Results:    _0 @ ABGS No results found for this basename: PHART, PCO2, PO2ART, TCO2, HCO3,  in the last 72 hours CULTURES Recent Results (from the past 240 hour(s))  URINE CULTURE     Status: None   Collection Time    04/13/13  1:22 AM      Result Value Range Status   Specimen Description URINE, CLEAN CATCH   Final   Special Requests NONE   Final   Culture  Setup Time 04/13/2013 14:37   Final   Colony Count 65,000 COLONIES/ML   Final   Culture     Final   Value: Multiple bacterial morphotypes present, none predominant. Suggest appropriate recollection if clinically indicated.   Report Status 04/14/2013 FINAL   Final  SURGICAL PCR SCREEN     Status: None   Collection Time    04/13/13  9:45 AM      Result Value Range Status   MRSA, PCR NEGATIVE  NEGATIVE Final   Staphylococcus aureus NEGATIVE  NEGATIVE Final   Comment:            The Xpert SA Assay (FDA     approved for NASAL specimens     in patients over 52 years of age),     is one component of     a comprehensive surveillance     program.  Test performance has     been validated by [employer] for patients greater     than or equal to 39 year old.     It is not intended     to diagnose infection nor to     guide or monitor treatment.  URINE CULTURE     Status: None   Collection Time    04/15/13  5:27 AM      Result Value Range Status   Specimen Description URINE, CLEAN CATCH   Final   Special Requests NONE   Final   Culture  Setup Time 04/15/2013 18:26   Final   Colony Count NO GROWTH   Final   Culture NO GROWTH   Final   Report Status 04/16/2013 FINAL   Final   Studies/Results: Dg Chest Port 1 View  04/19/2013   *RADIOLOGY REPORT*  Clinical Data: PICC placement.  PORTABLE CHEST - 1 VIEW  Comparison: Single view of the chest 04/15/2013.  Findings: Right PICC is in place with the tip projecting over the mid superior vena cava.  NG tube tip is in the stomach.  Lungs are emphysematous.  No pneumothorax.  There has been some increase in right greater than left pleural effusions and basilar airspace disease.  IMPRESSION:  1.  Tip of right PICC  projects over the mid superior vena cava. 2.  Slight increase in small bilateral pleural effusions and basilar airspace disease, worse on the right.   Original Report Authenticated By: Orlean Patten, M.D.    Medications: I have reviewed the patient's current medications.  Assesment: 1. Post-op ileus 2. Hypokalemia 3. S/P appendectomy 4. DM type II 5. Hypertension Active Problems:   * No active hospital problems. *    Plan: Continue TPN Continue to monitor electrolytes   LOS: 6 days   [name] 04/21/2013, 7:46 AM

## 2013-04-21 NOTE — Progress Notes (Signed)
[place] NOTE  Pharmacy Consult for TPN Indication: prolonged ileus  Allergies  Allergen Reactions  . Penicillins Rash   Patient Measurements: Height: _0  (162.6 cm) Weight: 156 lb 8.4 oz (71 kg) IBW/kg (Calculated) : 54.7  Vital Signs: Temp: 98 F (36.7 C) (06/06 0651) Temp src: Oral (06/06 0651) BP: 153/70 mmHg (06/06 0651) Pulse Rate: 83 (06/06 0651) Intake/Output from previous day: 06/05 0701 - 06/06 0700 In: 2528.7 [I.V.:514.7; IV Piggyback:300; ZOX:0960] Out: -  Intake/Output from this shift: Total I/O In: 20 [I.V.:20] Out: 100 [Emesis/NG output:100]  Labs:  Recent Labs  04/19/13 0450 04/20/13 0451 04/21/13 0450  WBC 10.0 10.0 7.6  HGB 12.3 11.7* 11.4*  HCT 34.8* 33.6* 32.4*  PLT 385 409* 385     Recent Labs  04/19/13 1829 04/20/13 0451 04/21/13 0450 04/21/13 1017  NA 132* 130*  --  131*  K 3.0* 3.0*  --  3.7  CL 90* 91*  --  94*  CO2 28 29  --  32  GLUCOSE 132* 223*  --  154*  BUN <3* 3*  --  8  CREATININE 0.40* 0.35*  --  0.37*  CALCIUM 8.4 8.1*  --  8.3*  MG  --  1.4* 1.8  --   PHOS  --  2.3  --   --   PROT  --  5.8*  --   --   ALBUMIN  --  2.5*  --   --   AST  --  16  --   --   ALT  --  9  --   --   ALKPHOS  --  85  --   --   BILITOT  --  0.4  --   --   PREALBUMIN  --  8.0*  --   --   TRIG  --  85  --   --   CHOL  --  83  --   --    Estimated Creatinine Clearance: 54.2 ml/min (by C-G formula based on Cr of 0.37).    Recent Labs  04/21/13 0339 04/21/13 0736 04/21/13 1118  GLUCAP 136* 165* 132*   Medical History: Past Medical History  Diagnosis Date  . Hemorrhage of gastrointestinal tract, unspecified   . Other malaise and fatigue   . Tobacco use disorder   . Parotid tumor   . Neoplasm of malignant     breast  ,HX   . Diabetes mellitus   . Osteopenia   . Hypertension   . Hyperlipidemia   . Depression   . Anxiety state, unspecified    Medications:  Scheduled:  . docusate sodium  100 mg Oral  BID  . enoxaparin (LOVENOX) injection  40 mg Subcutaneous Q24H  . erythromycin  250 mg Intravenous Q8H  . insulin aspart  0-15 Units Subcutaneous Q4H  . ketorolac  15 mg Intravenous Q6H  . metoprolol  5 mg Intravenous Q6H  . pantoprazole (PROTONIX) IV  40 mg Intravenous QHS  . sodium chloride  10-40 mL Intracatheter Q12H   Insulin Requirements in the past 24 hours:  ~ 12 units from SSI  Current Nutrition:  TPN  Assessment: 79yo female who underwent laparoscopic appendectomy on 5/29.  Pt returned to [facility] after discharge c/o worsening n/v and abdominal tenderness.  Pt has prolonged post-op ileus and has been started on TPN.   Labs reviewed.  Potassium replenished.  Sodium remains low.   Renal fxn remains unchanged.  I/O + 2.5 liters  today per report. Prealbumin is low.  Pt has hx of DM and has been receiving sliding scale insulin since admission.  Her CBGs are elevated since starting TPN despite addition of insulin but they are gradually improving.  Will increase insulin in TPN.   Nutritional Goals:  1700-1900 non-protein kCal, 110-120 grams of protein per day  Plan:  Increase TPN (Clinimix E 5/15) to 85m/hr today Will increase insulin to 40 units per 2 liter bag of TPN Provide MVI, Trace Elements, and Lipids on MWF (due to shortage) Modify IVF: Normal Saline w/ 427m KCL per liter at 4021mr Monitor lytes, renal fxn, fluid status, and glucose daily for now  HalHart Robinsons6/04/2013,12:25 PM

## 2013-04-22 LAB — BASIC METABOLIC PANEL
BUN: 9 mg/dL (ref 6–23)
CO2: 31 mEq/L (ref 19–32)
Calcium: 8.2 mg/dL — ABNORMAL LOW (ref 8.4–10.5)
Chloride: 95 mEq/L — ABNORMAL LOW (ref 96–112)
Creatinine, Ser: 0.35 mg/dL — ABNORMAL LOW (ref 0.50–1.10)
GFR calc Af Amer: >90 mL/min (ref >90)
GFR calc non Af Amer: >90 mL/min (ref >90)
Glucose, Bld: 133 mg/dL — ABNORMAL HIGH (ref 70–99)
Potassium: 3.7 mEq/L (ref 3.5–5.1)
Sodium: 131 mEq/L — ABNORMAL LOW (ref 135–145)

## 2013-04-22 LAB — GLUCOSE, CAPILLARY
Glucose-Capillary: 115 mg/dL — ABNORMAL HIGH (ref 70–99)
Glucose-Capillary: 127 mg/dL — ABNORMAL HIGH (ref 70–99)
Glucose-Capillary: 127 mg/dL — ABNORMAL HIGH (ref 70–99)
Glucose-Capillary: 145 mg/dL — ABNORMAL HIGH (ref 70–99)
Glucose-Capillary: 148 mg/dL — ABNORMAL HIGH (ref 70–99)
Glucose-Capillary: 150 mg/dL — ABNORMAL HIGH (ref 70–99)

## 2013-04-22 LAB — CBC
HCT: 32.1 % — ABNORMAL LOW (ref 36.0–46.0)
Hemoglobin: 11.3 g/dL — ABNORMAL LOW (ref 12.0–15.0)
MCH: 33.6 pg (ref 26.0–34.0)
MCHC: 35.2 g/dL (ref 30.0–36.0)
MCV: 95.5 fL (ref 78.0–100.0)
Platelets: 425 K/uL — ABNORMAL HIGH (ref 150–400)
RBC: 3.36 MIL/uL — ABNORMAL LOW (ref 3.87–5.11)
RDW: 13.5 % (ref 11.5–15.5)
WBC: 8.1 K/uL (ref 4.0–10.5)

## 2013-04-22 MED ORDER — INSULIN REGULAR HUMAN 100 UNIT/ML IJ SOLN
INTRAVENOUS | Status: AC
  Administered 2013-04-22: 17:00:00 via INTRAVENOUS
  Filled 2013-04-22: qty 2000

## 2013-04-22 MED ORDER — METOCLOPRAMIDE HCL 5 MG/ML IJ SOLN
5.0000 mg | Freq: Three times a day (TID) | INTRAMUSCULAR | Status: DC
  Administered 2013-04-22 – 2013-04-26 (×12): 5 mg via INTRAVENOUS
  Filled 2013-04-22 (×12): qty 2

## 2013-04-22 NOTE — Progress Notes (Signed)
Subjective: No complaints of nausea. Positive flatus. No bowel movement. Feels better. Patient's family expressed the think she" looks better"..  Objective: Vital signs in last 24 hours: Temp:  [97.3 F (36.3 C)-98.4 F (36.9 C)] 98.4 F (36.9 C) (06/07 1435) Pulse Rate:  [72-81] 76 (06/07 1435) Resp:  [20] 20 (06/07 1435) BP: (141-159)/(66-82) 152/66 mmHg (06/07 1435) SpO2:  [95 %-100 %] 98 % (06/07 1435) Last BM Date: 04/10/13  Intake/Output from previous day: 05/03/2023 0701 - 06/07 0700 In: 3218.8 [I.V.:849.3; IV Piggyback:300; TPN:2069.5] Out: 2305 [TIWPY:0998; Emesis/NG output:350] Intake/Output this shift: Total I/O In: 0  Out: 600 [Urine:600]  General appearance: alert and no distress Resp: clear to auscultation bilaterally Cardio: regular rate and rhythm GI: Intermittent bowel sounds, soft, mild distention, mild expected tenderness. Incisions are clean dry and intact.  Lab Results:   Recent Labs  2013-05-02 0450 04/22/13 0627  WBC 7.6 8.1  HGB 11.4* 11.3*  HCT 32.4* 32.1*  PLT 385 425*   BMET  Recent Labs  05-02-2013 1017 04/22/13 0627  NA 131* 131*  K 3.7 3.7  CL 94* 95*  CO2 32 31  GLUCOSE 154* 133*  BUN 8 9  CREATININE 0.37* 0.35*  CALCIUM 8.3* 8.2*   PT/INR No results found for this basename: LABPROT, INR,  in the last 72 hours ABG No results found for this basename: PHART, PCO2, PO2, HCO3,  in the last 72 hours  Studies/Results: Dg Abd 2 Views  May 02, 2013   *RADIOLOGY REPORT*  Clinical Data:  ABDOMEN - 2 VIEW  Comparison: CT abdomen pelvis dated 04/17/2013  Findings: Gaseous distension of multiple loops of small and large bowel, compatible with reported history of adynamic ileus.  Residual contrast within the right colon.  Enteric tube terminates in the gastric body.  Cholecystectomy clips.  IMPRESSION: Gaseous distension of multiple loops of small and large bowel, compatible with reported history of adynamic ileus.   Original Report Authenticated  By: Julian Hy, M.D.    Anti-infectives: Anti-infectives   Start     Dose/Rate Route Frequency Ordered Stop   04/20/13 1500  erythromycin 250 mg in sodium chloride 0.9 % 100 mL IVPB     250 mg 100 mL/hr over 60 Minutes Intravenous 3 times per day 04/20/13 1351     04/17/13 1000  levofloxacin (LEVAQUIN) IVPB 500 mg  Status:  Discontinued     500 mg 100 mL/hr over 60 Minutes Intravenous Every 24 hours 04/16/13 1411 04/20/13 1351   04/16/13 2200  metroNIDAZOLE (FLAGYL) IVPB 500 mg  Status:  Discontinued     500 mg 100 mL/hr over 60 Minutes Intravenous Every 8 hours 04/16/13 1411 04/20/13 1351   04/15/13 2200  metroNIDAZOLE (FLAGYL) tablet 500 mg  Status:  Discontinued     500 mg Oral 3 times per day 04/15/13 1241 04/16/13 1411   04/15/13 1330  levofloxacin (LEVAQUIN) tablet 500 mg  Status:  Discontinued     500 mg Oral Daily 04/15/13 1322 04/16/13 1411   04/15/13 1200  metroNIDAZOLE (FLAGYL) IVPB 500 mg  Status:  Discontinued     500 mg 100 mL/hr over 60 Minutes Intravenous Every 8 hours 04/15/13 1125 04/15/13 1241   04/15/13 1000  metroNIDAZOLE (FLAGYL) IVPB 500 mg  Status:  Discontinued     500 mg 100 mL/hr over 60 Minutes Intravenous Every 8 hours 04/15/13 0952 04/15/13 1125   04/15/13 0615  ciprofloxacin (CIPRO) IVPB 400 mg     400 mg 200 mL/hr over 60 Minutes Intravenous  Once 04/15/13 0600 04/15/13 0730      Assessment/Plan: s/p * No surgery found * Adynamic ileus. Patient slowly appears to be having some resolution. We'll try a clamping the nasogastric tube. Continue TPN for now. Continue mobile and. We'll trial some Reglan. Await for bowel function improvement  LOS: 7 days    Eiley Mcginnity C 04/22/2013

## 2013-04-22 NOTE — Progress Notes (Signed)
[place] NOTE  Pharmacy Consult for TPN Indication: prolonged ileus  Allergies  Allergen Reactions  . Penicillins Rash   Patient Measurements: Height: _0  (162.6 cm) Weight: 156 lb 8.4 oz (71 kg) IBW/kg (Calculated) : 54.7  Vital Signs: Temp: 97.3 F (36.3 C) (06/07 0500) Temp src: Oral (06/07 0500) BP: 152/82 mmHg (06/07 0500) Pulse Rate: 77 (06/07 0500) Intake/Output from previous day: 06/06 0701 - 06/07 0700 In: 3218.8 [I.V.:849.3; IV Piggyback:300; TPN:2069.5] Out: 2305 [Urine:1955; Emesis/NG output:350] Intake/Output from this shift:    Labs:  Recent Labs  04/20/13 0451 04/21/13 0450 04/22/13 0627  WBC 10.0 7.6 8.1  HGB 11.7* 11.4* 11.3*  HCT 33.6* 32.4* 32.1*  PLT 409* 385 425*     Recent Labs  04/20/13 0451 04/21/13 0450 04/21/13 1017 04/22/13 0627  NA 130*  --  131* 131*  K 3.0*  --  3.7 3.7  CL 91*  --  94* 95*  CO2 29  --  32 31  GLUCOSE 223*  --  154* 133*  BUN 3*  --  8 9  CREATININE 0.35*  --  0.37* 0.35*  CALCIUM 8.1*  --  8.3* 8.2*  MG 1.4* 1.8  --   --   PHOS 2.3  --   --   --   PROT 5.8*  --   --   --   ALBUMIN 2.5*  --   --   --   AST 16  --   --   --   ALT 9  --   --   --   ALKPHOS 85  --   --   --   BILITOT 0.4  --   --   --   PREALBUMIN 8.0*  --   --   --   TRIG 85  --   --   --   CHOL 83  --   --   --    Estimated Creatinine Clearance: 54.2 ml/min (by C-G formula based on Cr of 0.35).    Recent Labs  04/21/13 1952 04/22/13 0007 04/22/13 0359  GLUCAP 124* 115* 150*   Medical History: Past Medical History  Diagnosis Date  . Hemorrhage of gastrointestinal tract, unspecified   . Other malaise and fatigue   . Tobacco use disorder   . Parotid tumor   . Neoplasm of malignant     breast  ,HX   . Diabetes mellitus   . Osteopenia   . Hypertension   . Hyperlipidemia   . Depression   . Anxiety state, unspecified    Medications:  Scheduled:  . docusate sodium  100 mg Oral BID  . enoxaparin  (LOVENOX) injection  40 mg Subcutaneous Q24H  . erythromycin  250 mg Intravenous Q8H  . insulin aspart  0-15 Units Subcutaneous Q4H  . ketorolac  15 mg Intravenous Q6H  . metoprolol  5 mg Intravenous Q6H  . pantoprazole (PROTONIX) IV  40 mg Intravenous QHS  . sodium chloride  10-40 mL Intracatheter Q12H   Insulin Requirements in the past 24 hours:  14 units from SSI  Current Nutrition:  TPN  Assessment: 79yo female who underwent laparoscopic appendectomy on 5/29.  Pt returned to [facility] after discharge c/o worsening n/v and abdominal tenderness.  Pt has prolonged post-op ileus and has been started on TPN.   Labs reviewed.  Potassium replenished.  Sodium remains low.   Renal fxn remains unchanged.  I/O + 1.1 liters 6/6 per flow sheet. Prealbumin is  low.  Pt has hx of DM and has been receiving sliding scale insulin since admission.  Her CBGs are elevated since starting, but they are gradually improving.  Will continue insulin in TPN.   Nutritional Goals:  1700-1900 non-protein kCal, 110-120 grams of protein per day  Plan:  Increase TPN (Clinimix E 5/15) to 41m/hr today Will maintain insulin at 40 units per 2 liter bag of TPN Provide MVI, Trace Elements, and Lipids MWF. Modify IVF: Normal Saline w/ 452m KCL per liter at 10-2058mr Monitor lytes, renal fxn, fluid status, and glucose daily for now  HayPricilla Larsson7/2014,8:18 AM

## 2013-04-22 NOTE — Progress Notes (Signed)
Residual after clamping NGT for 6 hours was zero.  No complaints of pain or nausea.  NGT discontinued per MD order patient tolerated well.  Will continue to monitor.

## 2013-04-22 NOTE — Progress Notes (Signed)
Subjective: Patient is improving. She had some bowel movement. No nausea or vomiting. No fever or chills. Objective: Vital signs in last 24 hours: Temp:  [97.3 F (36.3 C)-98.3 F (36.8 C)] 97.3 F (36.3 C) (06/07 0500) Pulse Rate:  [72-82] 77 (06/07 0500) Resp:  [18-20] 20 (06/07 0500) BP: (141-171)/(67-84) 152/82 mmHg (06/07 0500) SpO2:  [98 %-100 %] 99 % (06/07 0500) Weight change:  Last BM Date: 04/10/13  Intake/Output from previous day: May 18, 2023 0701 - 06/07 0700 In: 3218.8 [I.V.:849.3; IV Piggyback:300; TPN:2069.5] Out: 2305 [ELFYB:0175; Emesis/NG output:350]  PHYSICAL EXAM General appearance: alert and no distress Resp: diminished breath sounds bilaterally and rhonchi bilaterally Cardio: S1, S2 normal GI: mildly disdended, soft and lax, bowel sound ++ Extremities: extremities normal, atraumatic, no cyanosis or edema  Lab Results:    _0 @ ABGS No results found for this basename: PHART, PCO2, PO2ART, TCO2, HCO3,  in the last 72 hours CULTURES Recent Results (from the past 240 hour(s))  URINE CULTURE     Status: None   Collection Time    04/13/13  1:22 AM      Result Value Range Status   Specimen Description URINE, CLEAN CATCH   Final   Special Requests NONE   Final   Culture  Setup Time 04/13/2013 14:37   Final   Colony Count 65,000 COLONIES/ML   Final   Culture     Final   Value: Multiple bacterial morphotypes present, none predominant. Suggest appropriate recollection if clinically indicated.   Report Status 04/14/2013 FINAL   Final  SURGICAL PCR SCREEN     Status: None   Collection Time    04/13/13  9:45 AM      Result Value Range Status   MRSA, PCR NEGATIVE  NEGATIVE Final   Staphylococcus aureus NEGATIVE  NEGATIVE Final   Comment:            The Xpert SA Assay (FDA     approved for NASAL specimens     in patients over 110 years of age),     is one component of     a comprehensive surveillance     program.  Test performance has     been validated  by [employer] for patients greater     than or equal to 75 year old.     It is not intended     to diagnose infection nor to     guide or monitor treatment.  URINE CULTURE     Status: None   Collection Time    04/15/13  5:27 AM      Result Value Range Status   Specimen Description URINE, CLEAN CATCH   Final   Special Requests NONE   Final   Culture  Setup Time 04/15/2013 18:26   Final   Colony Count NO GROWTH   Final   Culture NO GROWTH   Final   Report Status 04/16/2013 FINAL   Final   Studies/Results: Dg Abd 2 Views  2013-05-17   *RADIOLOGY REPORT*  Clinical Data:  ABDOMEN - 2 VIEW  Comparison: CT abdomen pelvis dated 04/17/2013  Findings: Gaseous distension of multiple loops of small and large bowel, compatible with reported history of adynamic ileus.  Residual contrast within the right colon.  Enteric tube terminates in the gastric body.  Cholecystectomy clips.  IMPRESSION: Gaseous distension of multiple loops of small and large bowel, compatible with reported history of adynamic ileus.   Original Report Authenticated By:  Julian Hy, M.D.    Medications: I have reviewed the patient's current medications.  Assesment: 1. Post-op ileus 2. Hypokalemia 3. S/P appendectomy 4. DM type II 5. Hypertension Active Problems:   * No active hospital problems. *    Plan: Continue TPN Continue to monitor electrolytes   LOS: 7 days   Urian Martenson 04/22/2013, 6:51 AM

## 2013-04-23 LAB — GLUCOSE, CAPILLARY
Glucose-Capillary: 129 mg/dL — ABNORMAL HIGH (ref 70–99)
Glucose-Capillary: 143 mg/dL — ABNORMAL HIGH (ref 70–99)
Glucose-Capillary: 160 mg/dL — ABNORMAL HIGH (ref 70–99)
Glucose-Capillary: 162 mg/dL — ABNORMAL HIGH (ref 70–99)
Glucose-Capillary: 162 mg/dL — ABNORMAL HIGH (ref 70–99)
Glucose-Capillary: 192 mg/dL — ABNORMAL HIGH (ref 70–99)
Glucose-Capillary: 199 mg/dL — ABNORMAL HIGH (ref 70–99)

## 2013-04-23 LAB — BASIC METABOLIC PANEL
BUN: 10 mg/dL (ref 6–23)
CO2: 28 mEq/L (ref 19–32)
Calcium: 8.4 mg/dL (ref 8.4–10.5)
Chloride: 96 mEq/L (ref 96–112)
Creatinine, Ser: 0.37 mg/dL — ABNORMAL LOW (ref 0.50–1.10)
GFR calc Af Amer: >90 mL/min (ref >90)
GFR calc non Af Amer: >90 mL/min (ref >90)
Glucose, Bld: 199 mg/dL — ABNORMAL HIGH (ref 70–99)
Potassium: 4.2 mEq/L (ref 3.5–5.1)
Sodium: 130 mEq/L — ABNORMAL LOW (ref 135–145)

## 2013-04-23 LAB — CBC
HCT: 31.4 % — ABNORMAL LOW (ref 36.0–46.0)
Hemoglobin: 10.9 g/dL — ABNORMAL LOW (ref 12.0–15.0)
MCH: 33.2 pg (ref 26.0–34.0)
MCHC: 34.7 g/dL (ref 30.0–36.0)
MCV: 95.7 fL (ref 78.0–100.0)
Platelets: 423 10*3/uL — ABNORMAL HIGH (ref 150–400)
RBC: 3.28 MIL/uL — ABNORMAL LOW (ref 3.87–5.11)
RDW: 13.7 % (ref 11.5–15.5)
WBC: 8.6 10*3/uL (ref 4.0–10.5)

## 2013-04-23 LAB — MAGNESIUM: Magnesium: 1.6 mg/dL (ref 1.5–2.5)

## 2013-04-23 MED ORDER — INSULIN REGULAR HUMAN 100 UNIT/ML IJ SOLN
INTRAMUSCULAR | Status: AC
  Administered 2013-04-23: 18:00:00 via INTRAVENOUS
  Filled 2013-04-23: qty 2000

## 2013-04-23 NOTE — Progress Notes (Signed)
Subjective: Patient feells better. Her NG tube is removed. Trying to move her bowel. No complaint.. Objective: Vital signs in last 24 hours: Temp:  [98 F (36.7 C)-98.4 F (36.9 C)] 98 F (36.7 C) (06/08 1434) Pulse Rate:  [77-101] 86 (06/08 1434) Resp:  [20] 20 (06/08 1434) BP: (159-181)/(83-103) 162/84 mmHg (06/08 1434) SpO2:  [93 %-97 %] 93 % (06/08 1434) Weight change:  Last BM Date: 04/10/13  Intake/Output from previous day: 06/07 0701 - 06/08 0700 In: 2395.8 [I.V.:503.3; IV Piggyback:200; TPN:1667.5] Out: 900 [Urine:900]  PHYSICAL EXAM General appearance: alert and no distress Resp: diminished breath sounds bilaterally and rhonchi bilaterally Cardio: S1, S2 normal GI: mildly disdended, soft and lax, bowel sound ++ Extremities: extremities normal, atraumatic, no cyanosis or edema  Lab Results:    _0 @ ABGS No results found for this basename: PHART, PCO2, PO2ART, TCO2, HCO3,  in the last 72 hours CULTURES Recent Results (from the past 240 hour(s))  URINE CULTURE     Status: None   Collection Time    04/15/13  5:27 AM      Result Value Range Status   Specimen Description URINE, CLEAN CATCH   Final   Special Requests NONE   Final   Culture  Setup Time 04/15/2013 18:26   Final   Colony Count NO GROWTH   Final   Culture NO GROWTH   Final   Report Status 04/16/2013 FINAL   Final   Studies/Results: No results found.  Medications: I have reviewed the patient's current medications.  Assesment: 1. Post-op ileus 2. Hypokalemia 3. S/P appendectomy 4. DM type II 5. Hypertension Active Problems:   * No active hospital problems. *    Plan: Continue TPN Continue to monitor electrolytes   LOS: 8 days   Babara Buffalo 04/23/2013, 3:03 PM

## 2013-04-23 NOTE — Plan of Care (Addendum)
Problem: Phase III Progression Outcomes Goal: Activity at appropriate level-compared to baseline Outcome: Progressing Ambulating in hall with walker, standby assist, tolerating well. Goal: TID

## 2013-04-23 NOTE — Progress Notes (Addendum)
[place] NOTE  Pharmacy Consult for TPN Indication: prolonged ileus  Allergies  Allergen Reactions  . Penicillins Rash   Patient Measurements: Height: _0  (162.6 cm) Weight: 156 lb 8.4 oz (71 kg) IBW/kg (Calculated) : 54.7  Vital Signs: Temp: 98.4 F (36.9 C) (06/08 0300) BP: 180/103 mmHg (06/08 0300) Pulse Rate: 90 (06/08 0300) Intake/Output from previous day: 06/07 0701 - 06/08 0700 In: 2395.8 [I.V.:503.3; IV Piggyback:200; TPN:1667.5] Out: 900 [Urine:900] Intake/Output from this shift:    Labs:  Recent Labs  04/21/13 0450 04/22/13 0627 04/23/13 0635  WBC 7.6 8.1 8.6  HGB 11.4* 11.3* 10.9*  HCT 32.4* 32.1* 31.4*  PLT 385 425* 423*     Recent Labs  04/21/13 0450 04/21/13 1017 04/22/13 0627 04/23/13 0635  NA  --  131* 131* 130*  K  --  3.7 3.7 4.2  CL  --  94* 95* 96  CO2  --  32 31 28  GLUCOSE  --  154* 133* 199*  BUN  --  _1 CREATININE  --  0.37* 0.35* 0.37*  CALCIUM  --  8.3* 8.2* 8.4  MG 1.8  --   --  1.6   Estimated Creatinine Clearance: 54.2 ml/min (by C-G formula based on Cr of 0.37).    Recent Labs  04/22/13 2319 04/23/13 0424 04/23/13 0721  GLUCAP 129* 143* 199*   Medical History: Past Medical History  Diagnosis Date  . Hemorrhage of gastrointestinal tract, unspecified   . Other malaise and fatigue   . Tobacco use disorder   . Parotid tumor   . Neoplasm of malignant     breast  ,HX   . Diabetes mellitus   . Osteopenia   . Hypertension   . Hyperlipidemia   . Depression   . Anxiety state, unspecified    Medications:  Scheduled:  . docusate sodium  100 mg Oral BID  . enoxaparin (LOVENOX) injection  40 mg Subcutaneous Q24H  . erythromycin  250 mg Intravenous Q8H  . insulin aspart  0-15 Units Subcutaneous Q4H  . ketorolac  15 mg Intravenous Q6H  . metoCLOPramide (REGLAN) injection  5 mg Intravenous Q8H  . metoprolol  5 mg Intravenous Q6H  . pantoprazole (PROTONIX) IV  40 mg Intravenous QHS  .  sodium chloride  10-40 mL Intracatheter Q12H   Insulin Requirements in the past 24 hours:  12 units from SSI  Current Nutrition:  TPN  Assessment: 79yo female who underwent laparoscopic appendectomy on 5/29.  Pt returned to [facility] after discharge c/o worsening n/v and abdominal tenderness.  Pt has prolonged post-op ileus and has been started on TPN.   Labs reviewed.  Potassium replenished.  Sodium remains low.   Renal fxn remains unchanged.  I/O + 1.1 liters 6/6 per flow sheet. Prealbumin is low.  Pt has hx of DM and has been receiving sliding scale insulin since admission.  Her CBGs are elevated since starting, but they are gradually improving.  Most CBGs yesterday were around 140.  There was one outlier this AM of 199.  Will continue insulin in TPN.   Receiving metoclopramide and erythromycin for induce GI motility.  Nutritional Goals:  1700-1900 non-protein kCal, 110-120 grams of protein per day  Plan:  Increase TPN (Clinimix E 5/15) to 58m/hr today Will increase insulin to 50 units per 2 liter bag of TPN Provide MVI, Trace Elements, and Lipids MWF. Modify IVF: Normal Saline w/ 49m KCL per liter at 10-2027mr Monitor lytes, renal  fxn, fluid status, and glucose daily for now  Pricilla Larsson 04/23/2013,9:57 AM

## 2013-04-23 NOTE — Progress Notes (Signed)
  Subjective: No complaints of increased nausea.  Tolerating discontinuation of NG.  Still with Flatus but no BM.  Feels better.    Objective: Vital signs in last 24 hours: Temp:  [98 F (36.7 Chambers)-98.4 F (36.9 Chambers)] 98 F (36.7 Chambers) (06/08 1434) Pulse Rate:  [77-101] 86 (06/08 1434) Resp:  [20] 20 (06/08 1434) BP: (159-181)/(83-103) 162/84 mmHg (06/08 1434) SpO2:  [93 %-97 %] 93 % (06/08 1434) Last BM Date: 04/10/13  Intake/Output from previous day: 06/07 0701 - 06/08 0700 In: 2395.8 [I.V.:503.3; IV Piggyback:200; TPN:1667.5] Out: 900 [Urine:900] Intake/Output this shift:    General appearance: alert and no distress GI: Increased bowel sounds.  mild tenderness.  No distention.  Incisions clea, dry and intact.  Lab Results:   Recent Labs  04/22/13 0627 04/23/13 0635  WBC 8.1 8.6  HGB 11.3* 10.9*  HCT 32.1* 31.4*  PLT 425* 423*   BMET  Recent Labs  04/22/13 0627 04/23/13 0635  NA 131* 130*  K 3.7 4.2  CL 95* 96  CO2 31 28  GLUCOSE 133* 199*  BUN 9 10  CREATININE 0.35* 0.37*  CALCIUM 8.2* 8.4   PT/INR No results found for this basename: LABPROT, INR,  in the last 72 hours ABG No results found for this basename: PHART, PCO2, PO2, HCO3,  in the last 72 hours  Studies/Results: No results found.  Anti-infectives: Anti-infectives   Start     Dose/Rate Route Frequency Ordered Stop   04/20/13 1500  erythromycin 250 mg in sodium chloride 0.9 % 100 mL IVPB     250 mg 100 mL/hr over 60 Minutes Intravenous 3 times per day 04/20/13 1351     04/17/13 1000  levofloxacin (LEVAQUIN) IVPB 500 mg  Status:  Discontinued     500 mg 100 mL/hr over 60 Minutes Intravenous Every 24 hours 04/16/13 1411 04/20/13 1351   04/16/13 2200  metroNIDAZOLE (FLAGYL) IVPB 500 mg  Status:  Discontinued     500 mg 100 mL/hr over 60 Minutes Intravenous Every 8 hours 04/16/13 1411 04/20/13 1351   04/15/13 2200  metroNIDAZOLE (FLAGYL) tablet 500 mg  Status:  Discontinued     500 mg Oral 3  times per day 04/15/13 1241 04/16/13 1411   04/15/13 1330  levofloxacin (LEVAQUIN) tablet 500 mg  Status:  Discontinued     500 mg Oral Daily 04/15/13 1322 04/16/13 1411   04/15/13 1200  metroNIDAZOLE (FLAGYL) IVPB 500 mg  Status:  Discontinued     500 mg 100 mL/hr over 60 Minutes Intravenous Every 8 hours 04/15/13 1125 04/15/13 1241   04/15/13 1000  metroNIDAZOLE (FLAGYL) IVPB 500 mg  Status:  Discontinued     500 mg 100 mL/hr over 60 Minutes Intravenous Every 8 hours 04/15/13 0952 04/15/13 1125   04/15/13 0615  ciprofloxacin (CIPRO) IVPB 400 mg     400 mg 200 mL/hr over 60 Minutes Intravenous  Once 04/15/13 0600 04/15/13 0730      Assessment/Plan: s/p * No surgery found * Post-operative ileus.  Appears to be improving.  Stay with clears for now.  Continue TPN.    LOS: 8 days    [name] 04/23/2013

## 2013-04-24 LAB — COMPREHENSIVE METABOLIC PANEL
ALT: 26 U/L (ref 0–35)
AST: 59 U/L — ABNORMAL HIGH (ref 0–37)
Albumin: 2.8 g/dL — ABNORMAL LOW (ref 3.5–5.2)
Alkaline Phosphatase: 345 U/L — ABNORMAL HIGH (ref 39–117)
BUN: 13 mg/dL (ref 6–23)
CO2: 30 mEq/L (ref 19–32)
Calcium: 8.6 mg/dL (ref 8.4–10.5)
Chloride: 95 mEq/L — ABNORMAL LOW (ref 96–112)
Creatinine, Ser: 0.39 mg/dL — ABNORMAL LOW (ref 0.50–1.10)
GFR calc Af Amer: >90 mL/min (ref >90)
GFR calc non Af Amer: >90 mL/min (ref >90)
Glucose, Bld: 144 mg/dL — ABNORMAL HIGH (ref 70–99)
Potassium: 3.7 mEq/L (ref 3.5–5.1)
Sodium: 133 mEq/L — ABNORMAL LOW (ref 135–145)
Total Bilirubin: 0.4 mg/dL (ref 0.3–1.2)
Total Protein: 6.8 g/dL (ref 6.0–8.3)

## 2013-04-24 LAB — CBC
HCT: 31.8 % — ABNORMAL LOW (ref 36.0–46.0)
Hemoglobin: 11.1 g/dL — ABNORMAL LOW (ref 12.0–15.0)
MCH: 33.1 pg (ref 26.0–34.0)
MCHC: 34.9 g/dL (ref 30.0–36.0)
MCV: 94.9 fL (ref 78.0–100.0)
Platelets: 509 10*3/uL — ABNORMAL HIGH (ref 150–400)
RBC: 3.35 MIL/uL — ABNORMAL LOW (ref 3.87–5.11)
RDW: 13.6 % (ref 11.5–15.5)
WBC: 6.9 10*3/uL (ref 4.0–10.5)

## 2013-04-24 LAB — GLUCOSE, CAPILLARY
Glucose-Capillary: 94 mg/dL (ref 70–99)
Glucose-Capillary: 134 mg/dL — ABNORMAL HIGH (ref 70–99)
Glucose-Capillary: 138 mg/dL — ABNORMAL HIGH (ref 70–99)
Glucose-Capillary: 158 mg/dL — ABNORMAL HIGH (ref 70–99)
Glucose-Capillary: 190 mg/dL — ABNORMAL HIGH (ref 70–99)
Glucose-Capillary: 246 mg/dL — ABNORMAL HIGH (ref 70–99)

## 2013-04-24 LAB — DIFFERENTIAL
Basophils Absolute: 0 10*3/uL (ref 0.0–0.1)
Basophils Relative: 1 % (ref 0–1)
Eosinophils Absolute: 0.1 10*3/uL (ref 0.0–0.7)
Eosinophils Relative: 2 % (ref 0–5)
Lymphocytes Relative: 18 % (ref 12–46)
Lymphs Abs: 1.2 10*3/uL (ref 0.7–4.0)
Monocytes Absolute: 0.7 10*3/uL (ref 0.1–1.0)
Monocytes Relative: 10 % (ref 3–12)
Neutro Abs: 4.8 10*3/uL (ref 1.7–7.7)
Neutrophils Relative %: 70 % (ref 43–77)

## 2013-04-24 LAB — PHOSPHORUS: Phosphorus: 3.1 mg/dL (ref 2.3–4.6)

## 2013-04-24 LAB — TRIGLYCERIDES: Triglycerides: 131 mg/dL (ref <150)

## 2013-04-24 LAB — CHOLESTEROL, TOTAL: Cholesterol: 129 mg/dL (ref 0–200)

## 2013-04-24 LAB — MAGNESIUM: Magnesium: 1.6 mg/dL (ref 1.5–2.5)

## 2013-04-24 MED ORDER — ERYTHROMYCIN BASE 333 MG PO TBEC
333.0000 mg | DELAYED_RELEASE_TABLET | Freq: Three times a day (TID) | ORAL | Status: DC
  Administered 2013-04-24 – 2013-04-26 (×6): 333 mg via ORAL
  Filled 2013-04-24 (×13): qty 1

## 2013-04-24 MED ORDER — INSULIN REGULAR HUMAN 100 UNIT/ML IJ SOLN
INTRAMUSCULAR | Status: AC
  Administered 2013-04-24: 19:00:00 via INTRAVENOUS
  Filled 2013-04-24: qty 2000

## 2013-04-24 MED ORDER — POLYETHYLENE GLYCOL 3350 17 G PO PACK
17.0000 g | PACK | Freq: Two times a day (BID) | ORAL | Status: DC
  Administered 2013-04-24 – 2013-04-26 (×3): 17 g via ORAL
  Filled 2013-04-24 (×4): qty 1

## 2013-04-24 MED ORDER — GLUCERNA SHAKE PO LIQD
237.0000 mL | Freq: Three times a day (TID) | ORAL | Status: DC
  Administered 2013-04-24 – 2013-04-25 (×4): 237 mL via ORAL

## 2013-04-24 NOTE — Progress Notes (Signed)
  Subjective: Still bloody, but feels somewhat better. Appetite still decreased. Is having bowel movements.  Objective: Vital signs in last 24 hours: Temp:  [98.5 F (36.9 C)-99.3 F (37.4 C)] 99.3 F (37.4 C) (06/09 1512) Pulse Rate:  [94-112] 112 (06/09 1512) Resp:  [18-20] 20 (06/09 1512) BP: (158-175)/(72-103) 158/82 mmHg (06/09 1512) SpO2:  [92 %-96 %] 96 % (06/09 1512) Last BM Date: 04/10/13  Intake/Output from previous day: 06/08 0701 - 06/09 0700 In: 3122.7 [P.O.:560; I.V.:429.3; IV Piggyback:200; TPN:1933.3] Out: 250 [Urine:250] Intake/Output this shift: Total I/O In: 240 [P.O.:240] Out: -   General appearance: alert, cooperative and appears stated age Resp: clear to auscultation bilaterally Cardio: regular rate and rhythm, S1, S2 normal, no murmur, click, rub or gallop GI: Soft, distended but not tense. Occasional bowel sounds appreciated. Incisions healing well.  Lab Results:   Recent Labs  04/23/13 0635 04/24/13 1240  WBC 8.6 6.9  HGB 10.9* 11.1*  HCT 31.4* 31.8*  PLT 423* 509*   BMET  Recent Labs  04/23/13 0635 04/24/13 1240  NA 130* 133*  K 4.2 3.7  CL 96 95*  CO2 28 30  GLUCOSE 199* 144*  BUN 10 13  CREATININE 0.37* 0.39*  CALCIUM 8.4 8.6   PT/INR No results found for this basename: LABPROT, INR,  in the last 72 hours  Studies/Results: No results found.  Anti-infectives: Anti-infectives   Start     Dose/Rate Route Frequency Ordered Stop   04/20/13 1500  erythromycin 250 mg in sodium chloride 0.9 % 100 mL IVPB  Status:  Discontinued     250 mg 100 mL/hr over 60 Minutes Intravenous 3 times per day 04/20/13 1351 04/24/13 1259   04/17/13 1000  levofloxacin (LEVAQUIN) IVPB 500 mg  Status:  Discontinued     500 mg 100 mL/hr over 60 Minutes Intravenous Every 24 hours 04/16/13 1411 04/20/13 1351   04/16/13 2200  metroNIDAZOLE (FLAGYL) IVPB 500 mg  Status:  Discontinued     500 mg 100 mL/hr over 60 Minutes Intravenous Every 8 hours  04/16/13 1411 04/20/13 1351   04/15/13 2200  metroNIDAZOLE (FLAGYL) tablet 500 mg  Status:  Discontinued     500 mg Oral 3 times per day 04/15/13 1241 04/16/13 1411   04/15/13 1330  levofloxacin (LEVAQUIN) tablet 500 mg  Status:  Discontinued     500 mg Oral Daily 04/15/13 1322 04/16/13 1411   04/15/13 1200  metroNIDAZOLE (FLAGYL) IVPB 500 mg  Status:  Discontinued     500 mg 100 mL/hr over 60 Minutes Intravenous Every 8 hours 04/15/13 1125 04/15/13 1241   04/15/13 1000  metroNIDAZOLE (FLAGYL) IVPB 500 mg  Status:  Discontinued     500 mg 100 mL/hr over 60 Minutes Intravenous Every 8 hours 04/15/13 0952 04/15/13 1125   04/15/13 0615  ciprofloxacin (CIPRO) IVPB 400 mg     400 mg 200 mL/hr over 60 Minutes Intravenous  Once 04/15/13 0600 04/15/13 0730      Assessment/Plan: Impression: Postoperative ileus, slowly resolving. Plan: We'll advance to low fat diet. Will wean TPN as per pharmacy. Will switch to by mouth erythromycin. Will add MiraLAX.  LOS: 9 days    Timika Muench A 04/24/2013

## 2013-04-24 NOTE — Progress Notes (Signed)
Nutrition Follow-up   INTERVENTION: Add Glucerna Shake po TID, each supplement provides 220 kcal and 10 grams of protein. TPN per pharmacy Recommend advanced diet as tolerated: Low Fiber diet  NUTRITION DIAGNOSIS: Inadequate oral intake related to altered GI function; improving  Goal: Pt to meet >/= 90% of their estimated nutrition needs; met. She will successfully transition to oral diet with po intake sufficient to meet nutrition target intake; progressing  Monitor: Nutrition support measures and transition to oral intake  ASSESSMENT:  Pt NGT removed and diet advanced now to full liquids. She reports appetite as poor. Daughter present and says pt has had multiple BM's. Pt is more alert and talkative today.  Her TPN Rx currently: Clinimix E 5/15 @ 80 ml/hr. Lipids (20% IVFE @ 10 ml/hr), multivitamins, and trace elements are provided 3 times weekly (MWF) due to national backorder.   Provides 1568 kcal and 96 grams protein daily (based on weekly average).  Meets 100% minimum estimated kcal and 96% minimum estimated protein needs.   Height: Ht Readings from Last 1 Encounters:  04/15/13 _0  (1.626 m)    Weight: Wt Readings from Last 1 Encounters:  04/20/13 156 lb 8.4 oz (71 kg)    Ideal Body Weight: 120# (54.5 kg)  % Ideal Body Weight: 130%  Wt Readings from Last 10 Encounters:  04/20/13 156 lb 8.4 oz (71 kg)  04/13/13 148 lb (67.132 kg)  04/13/13 148 lb (67.132 kg)  02/16/13 148 lb (67.132 kg)  11/27/11 149 lb 3.2 oz (67.677 kg)  10/21/11 144 lb (65.318 kg)  10/30/10 145 lb (65.772 kg)  04/15/10 141 lb (63.957 kg)  08/06/09 137 lb (62.143 kg)  05/07/09 134 lb (60.782 kg)    Usual Body Weight: 140-145#   % Usual Body Weight: 108%  BMI:  Body mass index is 26.85 kg/(m^2). overweight  Estimated Nutritional Needs: Kcal: 7001-7494 Protein: 100-114 gr Fluid: per MD goals  Skin: abdominal incision  Diet Order: Full Liquid  EDUCATION NEEDS: -Education needs  addressed   Intake/Output Summary (Last 24 hours) at 04/24/13 1144 Last data filed at 04/24/13 0830  Gross per 24 hour  Intake 3042.67 ml  Output    250 ml  Net 2792.67 ml    Last BM: 04/24/13 multiple BM's per daughter  Labs:   Recent Labs Lab 04/19/13 1829 04/20/13 0451 04/21/13 0450 04/21/13 1017 04/22/13 0627 04/23/13 0635  NA 132* 130*  --  131* 131* 130*  K 3.0* 3.0*  --  3.7 3.7 4.2  CL 90* 91*  --  94* 95* 96  CO2 28 29  --  32 31 28  BUN <3* 3*  --  _1 CREATININE 0.40* 0.35*  --  0.37* 0.35* 0.37*  CALCIUM 8.4 8.1*  --  8.3* 8.2* 8.4  MG  --  1.4* 1.8  --   --  1.6  PHOS  --  2.3  --   --   --   --   GLUCOSE 132* 223*  --  154* 133* 199*    CBG (last 3)   Recent Labs  04/24/13 0335 04/24/13 0742 04/24/13 1123  GLUCAP 94 134* 138*    Scheduled Meds: . docusate sodium  100 mg Oral BID  . enoxaparin (LOVENOX) injection  40 mg Subcutaneous Q24H  . erythromycin  250 mg Intravenous Q8H  . feeding supplement  237 mL Oral TID BM  . insulin aspart  0-15 Units Subcutaneous Q4H  . metoCLOPramide (REGLAN) injection  5 mg Intravenous Q8H  . metoprolol  5 mg Intravenous Q6H  . pantoprazole (PROTONIX) IV  40 mg Intravenous QHS  . sodium chloride  10-40 mL Intracatheter Q12H    Continuous Infusions: . Marland KitchenTPN (CLINIMIX-E) Adult 80 mL/hr at 04/23/13 1742  . 0.9 % NaCl with KCl 40 mEq / L 20 mL/hr (04/23/13 1343)    Past Medical History  Diagnosis Date  . Hemorrhage of gastrointestinal tract, unspecified   . Other malaise and fatigue   . Tobacco use disorder   . Parotid tumor   . Neoplasm of malignant     breast  ,HX   . Diabetes mellitus   . Osteopenia   . Hypertension   . Hyperlipidemia   . Depression   . Anxiety state, unspecified     Past Surgical History  Procedure Laterality Date  . Mastectomy  1994    left breast -related to breast cancer  . Vesicovaginal fistula closure w/ tah  1979  . Cholecystectomy  2008  . Hemorrhoid surgery   1960s  . Laparoscopic appendectomy N/A 04/13/2013    Procedure: APPENDECTOMY LAPAROSCOPIC;  Surgeon: Donato Heinz, MD;  Location: [facility];  Service: General;  Laterality: N/A;    Colman Cater MS,RD,LDN,CSG Office: [phone] Pager: [phone]

## 2013-04-24 NOTE — Progress Notes (Signed)
Patient had a large loose BM.

## 2013-04-24 NOTE — Progress Notes (Signed)
[facility] NOTE  Pharmacy Consult for TPN Indication: prolonged ileus  Allergies  Allergen Reactions  . Penicillins Rash   Patient Measurements: Height: _0  (162.6 cm) Weight: 156 lb 8.4 oz (71 kg) IBW/kg (Calculated) : 54.7  Vital Signs: Temp: 99.3 F (37.4 C) (06/09 1512) BP: 158/82 mmHg (06/09 1512) Pulse Rate: 112 (06/09 1512) Intake/Output from previous day: 06/08 0701 - 06/09 0700 In: 3122.7 [P.O.:560; I.V.:429.3; IV Piggyback:200; TPN:1933.3] Out: 250 [Urine:250] Intake/Output from this shift: Total I/O In: 240 [P.O.:240] Out: -   Labs:  Recent Labs  04/22/13 0627 04/23/13 0635 04/24/13 1240  WBC 8.1 8.6 6.9  HGB 11.3* 10.9* 11.1*  HCT 32.1* 31.4* 31.8*  PLT 425* 423* 509*     Recent Labs  04/22/13 0627 04/23/13 0635 04/24/13 1230 04/24/13 1240  NA 131* 130*  --  133*  K 3.7 4.2  --  3.7  CL 95* 96  --  95*  CO2 31 28  --  30  GLUCOSE 133* 199*  --  144*  BUN 9 10  --  13  CREATININE 0.35* 0.37*  --  0.39*  CALCIUM 8.2* 8.4  --  8.6  MG  --  1.6  --  1.6  PHOS  --   --   --  3.1  PROT  --   --   --  6.8  ALBUMIN  --   --   --  2.8*  AST  --   --   --  59*  ALT  --   --   --  26  ALKPHOS  --   --   --  345*  BILITOT  --   --   --  0.4  TRIG  --   --  131  --   CHOL  --   --  129  --    Estimated Creatinine Clearance: 54.2 ml/min (by C-G formula based on Cr of 0.39).    Recent Labs  04/24/13 0335 04/24/13 0742 04/24/13 1123  GLUCAP 94 134* 138*   Medical History: Past Medical History  Diagnosis Date  . Hemorrhage of gastrointestinal tract, unspecified   . Other malaise and fatigue   . Tobacco use disorder   . Parotid tumor   . Neoplasm of malignant     breast  ,HX   . Diabetes mellitus   . Osteopenia   . Hypertension   . Hyperlipidemia   . Depression   . Anxiety state, unspecified    Medications:  Scheduled:  . docusate sodium  100 mg Oral BID  . enoxaparin (LOVENOX) injection  40 mg Subcutaneous  Q24H  . feeding supplement  237 mL Oral TID BM  . insulin aspart  0-15 Units Subcutaneous Q4H  . metoCLOPramide (REGLAN) injection  5 mg Intravenous Q8H  . metoprolol  5 mg Intravenous Q6H  . pantoprazole (PROTONIX) IV  40 mg Intravenous QHS  . sodium chloride  10-40 mL Intracatheter Q12H   Insulin Requirements in the past 24 hours:  12 units from SSI  Current Nutrition:  TPN  Assessment: 79yo female who underwent laparoscopic appendectomy on 5/29.  Pt returned to [facility] after discharge c/o worsening n/v and abdominal tenderness.  Pt has prolonged post-op ileus and has been started on TPN.   Labs reviewed.  Potassium replenished.  Sodium remains low.   Renal fxn remains unchanged.  I/O + 1.1 liters 6/6 per flow sheet. Prealbumin is low.  Pt has hx of DM and  has been receiving sliding scale insulin since admission.  Alk Phos increased to 345.  Her CBGs are stable.  Patient tolerating clear liquid diet with supplemental Glucerna shakes.  Discussed with MD, will wean off TPN over 24 hours.  Receiving metoclopramide to induce GI motility.  Nutritional Goals:  1700-1900 non-protein kCal, 110-120 grams of protein per day  Plan:  Wean TPN (Clinimix E 5/15) to 40m/hr x 24 hours and then stop. Miantain insulin to 50 units per 2 liter bag of TPN No trace elements, MVI or lipids since weaning off and transitioning diet. Modify IVF: Normal Saline w/ 427m KCL per liter at 10-2075mr Monitor lytes, renal fxn, fluid status, and glucose daily for now  HayPricilla Larsson9/2014,3:20 PM

## 2013-04-24 NOTE — Progress Notes (Signed)
Patient and family member requested that labs be drawn later in the morning.  Lab will draw blood at 8am.

## 2013-04-24 NOTE — Progress Notes (Signed)
Subjective: Patient feels better. Her NG tube is out. She is tolerating clear liquid diet. No new complaint. Objective: Vital signs in last 24 hours: Temp:  [98 F (36.7 C)-98.6 F (37 C)] 98.5 F (36.9 C) (06/09 0545) Pulse Rate:  [86-102] 97 (06/09 0545) Resp:  [18-20] 20 (06/09 0545) BP: (162-181)/(83-103) 165/86 mmHg (06/09 0545) SpO2:  [92 %-95 %] 95 % (06/09 0545) Weight change:  Last BM Date: 04/10/13  Intake/Output from previous day: 06/08 0701 - 06/09 0700 In: 3122.7 [P.O.:560; I.V.:429.3; IV Piggyback:200; TPN:1933.3] Out: 250 [Urine:250]  PHYSICAL EXAM General appearance: alert and no distress Resp: diminished breath sounds bilaterally and rhonchi bilaterally Cardio: S1, S2 normal GI: mildly disdended, soft and lax, bowel sound ++ Extremities: extremities normal, atraumatic, no cyanosis or edema  Lab Results:    _0 @ ABGS No results found for this basename: PHART, PCO2, PO2ART, TCO2, HCO3,  in the last 72 hours CULTURES Recent Results (from the past 240 hour(s))  URINE CULTURE     Status: None   Collection Time    04/15/13  5:27 AM      Result Value Range Status   Specimen Description URINE, CLEAN CATCH   Final   Special Requests NONE   Final   Culture  Setup Time 04/15/2013 18:26   Final   Colony Count NO GROWTH   Final   Culture NO GROWTH   Final   Report Status 04/16/2013 FINAL   Final   Studies/Results: No results found.  Medications: I have reviewed the patient's current medications.  Assesment: 1. Post-op ileus 2. Hypokalemia 3. S/P appendectomy 4. DM type II 5. Hypertension Active Problems:   * No active hospital problems. *    Plan: Continue TPN Continue to monitor electrolytes   LOS: 9 days   Gabriela Giannelli 04/24/2013, 8:07 AM

## 2013-04-25 LAB — BASIC METABOLIC PANEL
BUN: 11 mg/dL (ref 6–23)
CO2: 26 mEq/L (ref 19–32)
Calcium: 8.7 mg/dL (ref 8.4–10.5)
Chloride: 95 mEq/L — ABNORMAL LOW (ref 96–112)
Creatinine, Ser: 0.42 mg/dL — ABNORMAL LOW (ref 0.50–1.10)
GFR calc Af Amer: >90 mL/min (ref >90)
GFR calc non Af Amer: >90 mL/min (ref >90)
Glucose, Bld: 235 mg/dL — ABNORMAL HIGH (ref 70–99)
Potassium: 4.1 mEq/L (ref 3.5–5.1)
Sodium: 131 mEq/L — ABNORMAL LOW (ref 135–145)

## 2013-04-25 LAB — GLUCOSE, CAPILLARY
Glucose-Capillary: 137 mg/dL — ABNORMAL HIGH (ref 70–99)
Glucose-Capillary: 250 mg/dL — ABNORMAL HIGH (ref 70–99)
Glucose-Capillary: 204 mg/dL — ABNORMAL HIGH (ref 70–99)
Glucose-Capillary: 218 mg/dL — ABNORMAL HIGH (ref 70–99)
Glucose-Capillary: 162 mg/dL — ABNORMAL HIGH (ref 70–99)

## 2013-04-25 LAB — CBC
HCT: 32.6 % — ABNORMAL LOW (ref 36.0–46.0)
Hemoglobin: 11.4 g/dL — ABNORMAL LOW (ref 12.0–15.0)
MCH: 33.3 pg (ref 26.0–34.0)
MCHC: 35 g/dL (ref 30.0–36.0)
MCV: 95.3 fL (ref 78.0–100.0)
Platelets: 508 10*3/uL — ABNORMAL HIGH (ref 150–400)
RBC: 3.42 MIL/uL — ABNORMAL LOW (ref 3.87–5.11)
RDW: 13.9 % (ref 11.5–15.5)
WBC: 10.4 10*3/uL (ref 4.0–10.5)

## 2013-04-25 LAB — PREALBUMIN: Prealbumin: 12.4 mg/dL — ABNORMAL LOW (ref 17.0–34.0)

## 2013-04-25 NOTE — Progress Notes (Signed)
  Subjective: Is having some abdominal cramping with bowel movements. Has had multiple bowel movements. Appetite still decreased.  Objective: Vital signs in last 24 hours: Temp:  [98.6 F (37 C)-99.6 F (37.6 C)] 98.6 F (37 C) (06/10 0506) Pulse Rate:  [95-117] 95 (06/10 0506) Resp:  [18-20] 19 (06/10 0506) BP: (139-159)/(72-82) 139/76 mmHg (06/10 0506) SpO2:  [96 %-100 %] 100 % (06/10 0506) Last BM Date: 04/24/13  Intake/Output from previous day: 06/09 0701 - 06/10 0700 In: 2398 [P.O.:240; I.V.:526; TPN:1632] Out: 1100 [Stool:1100] Intake/Output this shift:    General appearance: alert, cooperative, appears stated age and fatigued GI: Soft, less distended. Active bowel sounds. No rigidity noted. Incisions well-healed.  Lab Results:   Recent Labs  04/24/13 1240 04/25/13 0616  WBC 6.9 10.4  HGB 11.1* 11.4*  HCT 31.8* 32.6*  PLT 509* 508*   BMET  Recent Labs  04/24/13 1240 04/25/13 0616  NA 133* 131*  K 3.7 4.1  CL 95* 95*  CO2 30 26  GLUCOSE 144* 235*  BUN 13 11  CREATININE 0.39* 0.42*  CALCIUM 8.6 8.7   PT/INR No results found for this basename: LABPROT, INR,  in the last 72 hours  Studies/Results: No results found.  Anti-infectives: Anti-infectives   Start     Dose/Rate Route Frequency Ordered Stop   04/24/13 1530  erythromycin (ERY-TAB) EC tablet 333 mg     333 mg Oral 3 times per day 04/24/13 1525     04/20/13 1500  erythromycin 250 mg in sodium chloride 0.9 % 100 mL IVPB  Status:  Discontinued     250 mg 100 mL/hr over 60 Minutes Intravenous 3 times per day 04/20/13 1351 04/24/13 1259   04/17/13 1000  levofloxacin (LEVAQUIN) IVPB 500 mg  Status:  Discontinued     500 mg 100 mL/hr over 60 Minutes Intravenous Every 24 hours 04/16/13 1411 04/20/13 1351   04/16/13 2200  metroNIDAZOLE (FLAGYL) IVPB 500 mg  Status:  Discontinued     500 mg 100 mL/hr over 60 Minutes Intravenous Every 8 hours 04/16/13 1411 04/20/13 1351   04/15/13 2200   metroNIDAZOLE (FLAGYL) tablet 500 mg  Status:  Discontinued     500 mg Oral 3 times per day 04/15/13 1241 04/16/13 1411   04/15/13 1330  levofloxacin (LEVAQUIN) tablet 500 mg  Status:  Discontinued     500 mg Oral Daily 04/15/13 1322 04/16/13 1411   04/15/13 1200  metroNIDAZOLE (FLAGYL) IVPB 500 mg  Status:  Discontinued     500 mg 100 mL/hr over 60 Minutes Intravenous Every 8 hours 04/15/13 1125 04/15/13 1241   04/15/13 1000  metroNIDAZOLE (FLAGYL) IVPB 500 mg  Status:  Discontinued     500 mg 100 mL/hr over 60 Minutes Intravenous Every 8 hours 04/15/13 0952 04/15/13 1125   04/15/13 0615  ciprofloxacin (CIPRO) IVPB 400 mg     400 mg 200 mL/hr over 60 Minutes Intravenous  Once 04/15/13 0600 04/15/13 0730      Assessment/Plan: Impression: Postoperative ileus, resolving. Plan: Continue weaning TPN. Continue current treatment. Have encouraged patient to increase her by mouth intake. Anticipate discharge in next 24-48 hours.  LOS: 10 days    [name] A 04/25/2013

## 2013-04-25 NOTE — Progress Notes (Signed)
Subjective: Patient feels better. However, her appetite remained poor. She has moved her bowel. No nausea or vomiting. Objective: Vital signs in last 24 hours: Temp:  [98.6 F (37 C)-99.6 F (37.6 C)] 98.6 F (37 C) (06/10 0506) Pulse Rate:  [95-117] 95 (06/10 0506) Resp:  [18-20] 19 (06/10 0506) BP: (139-159)/(72-82) 139/76 mmHg (06/10 0506) SpO2:  [96 %-100 %] 100 % (06/10 0506) Weight change:  Last BM Date: 04/24/13  Intake/Output from previous day: 06/09 0701 - 06/10 0700 In: 2398 [P.O.:240; I.V.:526; TPN:1632] Out: 1100 [Stool:1100]  PHYSICAL EXAM General appearance: alert and no distress Resp: diminished breath sounds bilaterally and rhonchi bilaterally Cardio: S1, S2 normal GI: mildly disdended, soft and lax, bowel sound ++ Extremities: extremities normal, atraumatic, no cyanosis or edema  Lab Results:    _0 @ ABGS No results found for this basename: PHART, PCO2, PO2ART, TCO2, HCO3,  in the last 72 hours CULTURES No results found for this or any previous visit (from the past 240 hour(s)). Studies/Results: No results found.  Medications: I have reviewed the patient's current medications.  Assesment: 1. Post-op ileus 2. Hypokalemia 3. S/P appendectomy 4. DM type II 5. Hypertension Active Problems:   * No active hospital problems. *    Plan: Continue TPN Continue to monitor electrolytes   LOS: 10 days   [name] 04/25/2013, 8:08 AM

## 2013-04-26 LAB — GLUCOSE, CAPILLARY
Glucose-Capillary: 122 mg/dL — ABNORMAL HIGH (ref 70–99)
Glucose-Capillary: 144 mg/dL — ABNORMAL HIGH (ref 70–99)
Glucose-Capillary: 222 mg/dL — ABNORMAL HIGH (ref 70–99)
Glucose-Capillary: 99 mg/dL (ref 70–99)

## 2013-04-26 LAB — CBC
HCT: 32.8 % — ABNORMAL LOW (ref 36.0–46.0)
Hemoglobin: 11.3 g/dL — ABNORMAL LOW (ref 12.0–15.0)
MCH: 32.9 pg (ref 26.0–34.0)
MCHC: 34.5 g/dL (ref 30.0–36.0)
MCV: 95.6 fL (ref 78.0–100.0)
Platelets: 526 10*3/uL — ABNORMAL HIGH (ref 150–400)
RBC: 3.43 MIL/uL — ABNORMAL LOW (ref 3.87–5.11)
RDW: 14 % (ref 11.5–15.5)
WBC: 9.4 10*3/uL (ref 4.0–10.5)

## 2013-04-26 MED ORDER — HYDROCODONE-ACETAMINOPHEN 5-325 MG PO TABS: 1.0000 | ORAL_TABLET | ORAL | Status: DC | PRN

## 2013-04-26 MED ORDER — PANTOPRAZOLE SODIUM 40 MG PO TBEC: 40.0000 mg | DELAYED_RELEASE_TABLET | Freq: Every day | ORAL | Status: DC

## 2013-04-26 NOTE — Clinical Social Work Psychosocial (Signed)
Clinical Social Work Department BRIEF PSYCHOSOCIAL ASSESSMENT 04/26/2013  Patient:  [name], [name]     Account Number:  [id number]     Admit date:  04/15/2013  Clinical Social Worker:  Wyatt Haste  Date/Time:  04/26/2013 10:10 AM  Referred by:  Care Management  Date Referred:  04/26/2013 Referred for  SNF Placement   Other Referral:   Interview type:  Patient Other interview type:   and daughter- [name]    PSYCHOSOCIAL DATA Living Status:  FAMILY Admitted from facility:   Level of care:   Primary support name:  [name] Primary support relationship to patient:  CHILD, ADULT Degree of support available:   supportive    CURRENT CONCERNS Current Concerns  Post-Acute Placement   Other Concerns:    SOCIAL WORK ASSESSMENT / PLAN CSW met with pt and pt's daughter, [name] at bedside. Referred yesterday afternoon by CM as family felt pt may need placement. Pt alert and oriented and reports she and [name] live together, along with her son. Family appears to be very involved and supportive. She has been hospitalized for 11 days. Pt has generalized weakness due to this and PT evaluated pt this morning. At baseline, pt is independent with ADLs and was still driving. Pt states she would walk on the treadmill at home regularly. She is being d/c and recommendation is for home health. [name] states that they have a rolling walker and wheelchair at home from her father. They are also requesting a BSC. CSW notified CM. Pt feels she will be able to get in her car to go home.   Assessment/plan status:  Referral to [employer] Other assessment/ plan:   Information/referral to community resources:   CM for home health/equipment    PATIENT'S/FAMILY'S RESPONSE TO PLAN OF CARE: Pt and daughter report no CSW needs at this time as they feel she can manage at home with home health. CSW signing off but can be reconsulted if needed.       [name], [place]

## 2013-04-26 NOTE — Progress Notes (Signed)
Pt stated she was having some abdominal pressure and and had not urinated yet on night shift. She got up several times and stated she was unable to urinate on bedside commode. Bladder scan showed greater than 496m. MD notified. He gave verbal orders for an In and Out cath if patient is uncomfortable.

## 2013-04-26 NOTE — Discharge Summary (Signed)
Physician Discharge Summary  Patient ID: EVIE CROSTON MRN: [id number] DOB/AGE: September 20, 1931 79 y.o.  Admit date: 04/15/2013 Discharge date: 04/26/2013  Admission Diagnoses: Postoperative ileus, status post laparoscopic appendectomy  Discharge Diagnoses: Same Active Problems:   * No active hospital problems. *   Discharged Condition: good  Hospital Course: Patient is an 79 year old black female who underwent a laparoscopic appendectomy.  She was discharged after the surgery, but presented back [facility] with worsening nausea, vomiting, and abdominal distention. CT scan of the abdomen revealed a postoperative ileus without significant intra-abdominal abscess. She was placed on bowel rest and TPN. Her hospital course was prolonged due to the ileus, but it has subsequently resolved. She is being discharged home in good improving condition.  Treatments: TPN  Discharge Exam: Blood pressure 157/78, pulse 77, temperature 98.4 F (36.9 C), temperature source Oral, resp. rate 18, height _0  (1.626 m), weight 71 kg (156 lb 8.4 oz), SpO2 98.00%. General appearance: alert, cooperative, appears stated age and no distress Resp: clear to auscultation bilaterally Cardio: regular rate and rhythm, S1, S2 normal, no murmur, click, rub or gallop GI: soft, non-tender; bowel sounds normal; no masses,  no organomegaly  Disposition: 01-Home or Self Care     Medication List    TAKE these medications       ALPRAZolam 0.25 MG dissolvable tablet  Commonly known as:  NIRAVAM  Take 0.25 mg by mouth at bedtime as needed.     amLODipine-benazepril 5-20 MG per capsule  Commonly known as:  LOTREL  Take 1 capsule by mouth daily.     aspirin 325 MG tablet  Take 325 mg by mouth daily.     atorvastatin 10 MG tablet  Commonly known as:  LIPITOR  Take 10 mg by mouth daily.     calcium carbonate 200 MG capsule  Take 250 mg by mouth 2 (two) times daily with a meal.     cholecalciferol 1000 UNITS  tablet  Commonly known as:  VITAMIN D  Take 1,000 Units by mouth daily.     docusate sodium 100 MG capsule  Commonly known as:  COLACE  Take 300 mg by mouth daily as needed for constipation.     esomeprazole 40 MG capsule  Commonly known as:  NEXIUM  Take 40 mg by mouth daily before breakfast.     glyBURIDE-metformin 5-500 MG per tablet  Commonly known as:  GLUCOVANCE  Take 2 tablets by mouth 2 (two) times daily with a meal.     HYDROcodone-acetaminophen 5-325 MG per tablet  Commonly known as:  NORCO  Take 1-2 tablets by mouth every 4 (four) hours as needed for pain.     HYDROcodone-acetaminophen 5-325 MG per tablet  Commonly known as:  NORCO/VICODIN  Take 1 tablet by mouth every 4 (four) hours as needed for pain.     indapamide 1.25 MG tablet  Commonly known as:  LOZOL  Take 1.25 mg by mouth every morning.     insulin glargine 100 UNIT/ML injection  Commonly known as:  LANTUS  Inject 2-8 Units into the skin at bedtime. Based on what sugar readings are at bedtime.     metroNIDAZOLE 500 MG tablet  Commonly known as:  FLAGYL  Take 1 tablet (500 mg total) by mouth every 8 (eight) hours.     tiotropium 18 MCG inhalation capsule  Commonly known as:  SPIRIVA  Place 18 mcg into inhaler and inhale daily.  Follow-up Information   Follow up with Donato Heinz, MD. (As needed)    Contact information:   [place] [address] [phone]       Follow up with [facility], MD. (As needed)    Contact information:   [place] [address] [phone]       Signed: Jamesetta So 04/26/2013, 7:57 AM

## 2013-04-26 NOTE — Progress Notes (Signed)
Pt notified of MD orders for I&O cath if she was experiencing discomfort. She said she wanted an hour to attempt to urinate on her own first. Patient was able to void 349ms into the bedside commode and no longer felt pressure or pain in abdomen.

## 2013-04-26 NOTE — Evaluation (Signed)
Physical Therapy Evaluation Patient Details Name: [name] MRN: [id number] DOB: Jan 25, 1931 Today's Date: 04/26/2013 Time: 4665-9935 PT Time Calculation (min): 40 min  PT Assessment / Plan / Recommendation Clinical Impression  Pt was seen for evaluation, daughter present.  Pt has developed significant deconditioning following recent illness/prolongued hospitalization.  She had been fully independent with all ADLs (including driving) PTA, but now all mobility is compromised due to generalized weakness.  she now needs a walker for gait and gait is so slow that it is almost non functional.  She would be appropriate for SNF at d/c, however pt desires to return home.  She has excellent family support and therefore she should be able to transition to home with full [facility] services.    PT Assessment  All further PT needs can be met in the next venue of care    Follow Up Recommendations  Home health PT    Does the patient have the potential to tolerate intense rehabilitation      Barriers to Discharge        Equipment Recommendations  None recommended by PT    Recommendations for Other Services OT consult   Frequency      Precautions / Restrictions Precautions Precautions: Fall Restrictions Weight Bearing Restrictions: No   Pertinent Vitals/Pain       Mobility  Bed Mobility Bed Mobility: Supine to Sit;Sit to Supine Supine to Sit: 5: Supervision;HOB flat Sit to Supine: 5: Supervision;HOB flat Transfers Transfers: Sit to Stand;Stand to Sit Sit to Stand: 4: Min assist;From bed;With upper extremity assist Stand to Sit: 4: Min guard;To bed;With upper extremity assist Details for Transfer Assistance: pt is unsteady upon standing from sitting...needs hand held support immediately upon standing Ambulation/Gait Ambulation/Gait Assistance: 4: Min guard Ambulation Distance (Feet): 24 Feet Assistive device: Rolling walker Gait Pattern: Decreased stride length;Trunk  flexed;Shuffle Gait velocity: extremely slow General Gait Details: gait steady with a walker Stairs: No Wheelchair Mobility Wheelchair Mobility: No    Exercises     PT Diagnosis: Difficulty walking;Generalized weakness  PT Problem List: Decreased strength;Decreased activity tolerance;Decreased balance;Decreased knowledge of use of DME;Decreased safety awareness;Decreased knowledge of precautions PT Treatment Interventions:     PT Goals    Visit Information  Last PT Received On: 04/26/13    Subjective Data  Subjective: daughter is concerned that Mom is so much weaker than before this illness Patient Stated Goal: pt wants to go home   Prior [place] Lives With: Family Available Help at Discharge: Family;Available 24 hours/day Type of Home: House Home Access: Stairs to enter [employer] of Steps: 5 Entrance Stairs-Rails: Right;Left Home Layout: One level Bathroom Shower/Tub: Multimedia programmer: Standard Bathroom Accessibility: Yes How Accessible: Accessible via walker Home Adaptive Equipment: Walker - rolling;Wheelchair - manual;Bedside commode/3-in-1;Shower chair with back;Grab bars in shower;Straight cane Additional Comments: pt uses no AD for gait PTA Prior Function Level of Independence: Independent Able to Take Stairs?: Yes Driving: Yes Vocation: Retired Corporate investment banker: No difficulties    Solicitor Arousal/Alertness: Awake/alert Behavior During Therapy: WFL for tasks assessed/performed Overall Cognitive Status: Within Functional Limits for tasks assessed    Extremity/Trunk Assessment Right Lower Extremity Assessment RLE ROM/Strength/Tone: Deficits RLE ROM/Strength/Tone Deficits: strength 3/5 with only fair endurance RLE Sensation: WFL - Light Touch RLE Coordination: WFL - gross motor Left Lower Extremity Assessment LLE ROM/Strength/Tone: Deficits LLE ROM/Strength/Tone Deficits: strength 3/5  with fair endurance LLE Sensation: WFL - Light Touch LLE Coordination: WFL - gross motor Trunk  Assessment Trunk Assessment: Kyphotic   Balance Balance Balance Assessed: Yes Static Standing Balance Static Standing - Balance Support: No upper extremity supported Static Standing - Level of Assistance: 4: Min assist  End of Session PT - End of Session Equipment Utilized During Treatment: Gait belt Activity Tolerance: Patient limited by fatigue Patient left: in bed;with family/visitor present;with bed alarm set;with call bell/phone within reach  GP     Demetrios Isaacs L 04/26/2013, 9:47 AM

## 2013-04-26 NOTE — Progress Notes (Signed)
Patient states understanding of discharge instructions.

## 2013-04-26 NOTE — Progress Notes (Signed)
Subjective: Patient is resting. Her TPN is stopped. She is started on regular food but her appetite remained very poor. Objective: Vital signs in last 24 hours: Temp:  [98 F (36.7 C)-99.2 F (37.3 C)] 98.4 F (36.9 C) (06/11 0418) Pulse Rate:  [77-89] 77 (06/11 0418) Resp:  [18] 18 (06/11 0418) BP: (151-157)/(69-83) 157/78 mmHg (06/11 0418) SpO2:  [96 %-98 %] 98 % (06/11 0418) Weight change:  Last BM Date: 04/25/13  Intake/Output from previous day: 06/10 0701 - 06/11 0700 In: 1061.3 [P.O.:600; TPN:461.3] Out: 500 [Urine:500]  PHYSICAL EXAM General appearance: alert and no distress Resp: diminished breath sounds bilaterally and rhonchi bilaterally Cardio: S1, S2 normal GI: abdomen is soft and lax, bowel sound is ++ , no tenderness Extremities: extremities normal, atraumatic, no cyanosis or edema  Lab Results:    _0 @ [facility] No results found for this basename: PHART, PCO2, PO2ART, TCO2, HCO3,  in the last 72 hours CULTURES No results found for this or any previous visit (from the past 240 hour(s)). Studies/Results: No results found.  Medications: I have reviewed the patient's current medications.  Assesment: 1. Post-op ileus 2. Hypokalemia corrected 3. S/P appendectomy 4. DM type II 5. Hypertension Active Problems:   * No active hospital problems. *    Plan: Continue oral feeding Current treatment  LOS: 11 days   [name] 04/26/2013, 7:39 AM

## 2013-04-28 DIAGNOSIS — F329 Major depressive disorder, single episode, unspecified: Secondary | ICD-10-CM | POA: Diagnosis not present

## 2013-04-28 DIAGNOSIS — Z48815 Encounter for surgical aftercare following surgery on the digestive system: Secondary | ICD-10-CM | POA: Diagnosis not present

## 2013-04-28 DIAGNOSIS — Z794 Long term (current) use of insulin: Secondary | ICD-10-CM | POA: Diagnosis not present

## 2013-04-28 DIAGNOSIS — K929 Disease of digestive system, unspecified: Secondary | ICD-10-CM | POA: Diagnosis not present

## 2013-04-28 DIAGNOSIS — E119 Type 2 diabetes mellitus without complications: Secondary | ICD-10-CM | POA: Diagnosis not present

## 2013-04-28 DIAGNOSIS — F3289 Other specified depressive episodes: Secondary | ICD-10-CM | POA: Diagnosis not present

## 2013-04-28 DIAGNOSIS — I10 Essential (primary) hypertension: Secondary | ICD-10-CM | POA: Diagnosis not present

## 2013-04-28 DIAGNOSIS — F411 Generalized anxiety disorder: Secondary | ICD-10-CM | POA: Diagnosis not present

## 2013-05-01 DIAGNOSIS — K929 Disease of digestive system, unspecified: Secondary | ICD-10-CM | POA: Diagnosis not present

## 2013-05-01 DIAGNOSIS — F411 Generalized anxiety disorder: Secondary | ICD-10-CM | POA: Diagnosis not present

## 2013-05-01 DIAGNOSIS — I10 Essential (primary) hypertension: Secondary | ICD-10-CM | POA: Diagnosis not present

## 2013-05-01 DIAGNOSIS — Z48815 Encounter for surgical aftercare following surgery on the digestive system: Secondary | ICD-10-CM | POA: Diagnosis not present

## 2013-05-01 DIAGNOSIS — F3289 Other specified depressive episodes: Secondary | ICD-10-CM | POA: Diagnosis not present

## 2013-05-01 DIAGNOSIS — F329 Major depressive disorder, single episode, unspecified: Secondary | ICD-10-CM | POA: Diagnosis not present

## 2013-05-01 DIAGNOSIS — E119 Type 2 diabetes mellitus without complications: Secondary | ICD-10-CM | POA: Diagnosis not present

## 2013-05-02 DIAGNOSIS — F329 Major depressive disorder, single episode, unspecified: Secondary | ICD-10-CM | POA: Diagnosis not present

## 2013-05-02 DIAGNOSIS — I10 Essential (primary) hypertension: Secondary | ICD-10-CM | POA: Diagnosis not present

## 2013-05-02 DIAGNOSIS — Z48815 Encounter for surgical aftercare following surgery on the digestive system: Secondary | ICD-10-CM | POA: Diagnosis not present

## 2013-05-02 DIAGNOSIS — F3289 Other specified depressive episodes: Secondary | ICD-10-CM | POA: Diagnosis not present

## 2013-05-02 DIAGNOSIS — K929 Disease of digestive system, unspecified: Secondary | ICD-10-CM | POA: Diagnosis not present

## 2013-05-02 DIAGNOSIS — E119 Type 2 diabetes mellitus without complications: Secondary | ICD-10-CM | POA: Diagnosis not present

## 2013-05-02 DIAGNOSIS — F411 Generalized anxiety disorder: Secondary | ICD-10-CM | POA: Diagnosis not present

## 2013-05-04 DIAGNOSIS — F411 Generalized anxiety disorder: Secondary | ICD-10-CM | POA: Diagnosis not present

## 2013-05-04 DIAGNOSIS — F329 Major depressive disorder, single episode, unspecified: Secondary | ICD-10-CM | POA: Diagnosis not present

## 2013-05-04 DIAGNOSIS — K929 Disease of digestive system, unspecified: Secondary | ICD-10-CM | POA: Diagnosis not present

## 2013-05-04 DIAGNOSIS — F3289 Other specified depressive episodes: Secondary | ICD-10-CM | POA: Diagnosis not present

## 2013-05-04 DIAGNOSIS — E119 Type 2 diabetes mellitus without complications: Secondary | ICD-10-CM | POA: Diagnosis not present

## 2013-05-04 DIAGNOSIS — I10 Essential (primary) hypertension: Secondary | ICD-10-CM | POA: Diagnosis not present

## 2013-05-04 DIAGNOSIS — Z48815 Encounter for surgical aftercare following surgery on the digestive system: Secondary | ICD-10-CM | POA: Diagnosis not present

## 2013-05-08 ENCOUNTER — Emergency Department (HOSPITAL_COMMUNITY): Payer: Medicare Other

## 2013-05-08 ENCOUNTER — Encounter (HOSPITAL_COMMUNITY): Payer: Self-pay | Admitting: Emergency Medicine

## 2013-05-08 ENCOUNTER — Emergency Department (HOSPITAL_COMMUNITY)
Admission: EM | Admit: 2013-05-08 | Discharge: 2013-05-08 | Disposition: A | Discharge status: Home / Self Care | Payer: Medicare Other | Attending: Emergency Medicine | Admitting: Emergency Medicine

## 2013-05-08 DIAGNOSIS — R404 Transient alteration of awareness: Secondary | ICD-10-CM | POA: Diagnosis not present

## 2013-05-08 DIAGNOSIS — Z8739 Personal history of other diseases of the musculoskeletal system and connective tissue: Secondary | ICD-10-CM | POA: Insufficient documentation

## 2013-05-08 DIAGNOSIS — J449 Chronic obstructive pulmonary disease, unspecified: Secondary | ICD-10-CM | POA: Diagnosis not present

## 2013-05-08 DIAGNOSIS — F3289 Other specified depressive episodes: Secondary | ICD-10-CM | POA: Diagnosis not present

## 2013-05-08 DIAGNOSIS — Z7982 Long term (current) use of aspirin: Secondary | ICD-10-CM | POA: Insufficient documentation

## 2013-05-08 DIAGNOSIS — E119 Type 2 diabetes mellitus without complications: Secondary | ICD-10-CM | POA: Diagnosis not present

## 2013-05-08 DIAGNOSIS — R42 Dizziness and giddiness: Secondary | ICD-10-CM | POA: Diagnosis not present

## 2013-05-08 DIAGNOSIS — R5381 Other malaise: Secondary | ICD-10-CM | POA: Diagnosis not present

## 2013-05-08 DIAGNOSIS — Z87891 Personal history of nicotine dependence: Secondary | ICD-10-CM | POA: Insufficient documentation

## 2013-05-08 DIAGNOSIS — Z9089 Acquired absence of other organs: Secondary | ICD-10-CM | POA: Diagnosis not present

## 2013-05-08 DIAGNOSIS — Z9889 Other specified postprocedural states: Secondary | ICD-10-CM | POA: Insufficient documentation

## 2013-05-08 DIAGNOSIS — R6889 Other general symptoms and signs: Secondary | ICD-10-CM | POA: Diagnosis not present

## 2013-05-08 DIAGNOSIS — R000 Tachycardia, unspecified: Secondary | ICD-10-CM | POA: Diagnosis not present

## 2013-05-08 DIAGNOSIS — R5383 Other fatigue: Secondary | ICD-10-CM | POA: Diagnosis not present

## 2013-05-08 DIAGNOSIS — Z8719 Personal history of other diseases of the digestive system: Secondary | ICD-10-CM | POA: Diagnosis not present

## 2013-05-08 DIAGNOSIS — Z853 Personal history of malignant neoplasm of breast: Secondary | ICD-10-CM | POA: Diagnosis not present

## 2013-05-08 DIAGNOSIS — Z79899 Other long term (current) drug therapy: Secondary | ICD-10-CM | POA: Insufficient documentation

## 2013-05-08 DIAGNOSIS — R109 Unspecified abdominal pain: Secondary | ICD-10-CM | POA: Insufficient documentation

## 2013-05-08 DIAGNOSIS — Z862 Personal history of diseases of the blood and blood-forming organs and certain disorders involving the immune mechanism: Secondary | ICD-10-CM | POA: Insufficient documentation

## 2013-05-08 DIAGNOSIS — I10 Essential (primary) hypertension: Secondary | ICD-10-CM | POA: Diagnosis not present

## 2013-05-08 DIAGNOSIS — F329 Major depressive disorder, single episode, unspecified: Secondary | ICD-10-CM | POA: Diagnosis not present

## 2013-05-08 DIAGNOSIS — Z8639 Personal history of other endocrine, nutritional and metabolic disease: Secondary | ICD-10-CM | POA: Insufficient documentation

## 2013-05-08 DIAGNOSIS — Z794 Long term (current) use of insulin: Secondary | ICD-10-CM | POA: Diagnosis not present

## 2013-05-08 DIAGNOSIS — Z880 Allergy status to penicillin: Secondary | ICD-10-CM | POA: Insufficient documentation

## 2013-05-08 DIAGNOSIS — F411 Generalized anxiety disorder: Secondary | ICD-10-CM | POA: Diagnosis not present

## 2013-05-08 DIAGNOSIS — R531 Weakness: Secondary | ICD-10-CM

## 2013-05-08 DIAGNOSIS — R1084 Generalized abdominal pain: Secondary | ICD-10-CM | POA: Diagnosis not present

## 2013-05-08 LAB — CBC WITH DIFFERENTIAL/PLATELET
Basophils Absolute: 0.1 10*3/uL (ref 0.0–0.1)
Basophils Relative: 2 % — ABNORMAL HIGH (ref 0–1)
Eosinophils Absolute: 0.2 10*3/uL (ref 0.0–0.7)
Eosinophils Relative: 5 % (ref 0–5)
HCT: 37.6 % (ref 36.0–46.0)
Hemoglobin: 13 g/dL (ref 12.0–15.0)
Lymphocytes Relative: 37 % (ref 12–46)
Lymphs Abs: 1.8 10*3/uL (ref 0.7–4.0)
MCH: 32.7 pg (ref 26.0–34.0)
MCHC: 34.6 g/dL (ref 30.0–36.0)
MCV: 94.5 fL (ref 78.0–100.0)
Monocytes Absolute: 0.3 10*3/uL (ref 0.1–1.0)
Monocytes Relative: 6 % (ref 3–12)
Neutro Abs: 2.4 10*3/uL (ref 1.7–7.7)
Neutrophils Relative %: 51 % (ref 43–77)
Platelets: 431 10*3/uL — ABNORMAL HIGH (ref 150–400)
RBC: 3.98 MIL/uL (ref 3.87–5.11)
RDW: 13.6 % (ref 11.5–15.5)
WBC: 4.7 10*3/uL (ref 4.0–10.5)

## 2013-05-08 LAB — URINALYSIS, ROUTINE W REFLEX MICROSCOPIC
Bilirubin Urine: NEGATIVE
Glucose, UA: NEGATIVE mg/dL
Ketones, ur: NEGATIVE mg/dL
Nitrite: NEGATIVE
Specific Gravity, Urine: 1.01 (ref 1.005–1.030)
Urobilinogen, UA: 0.2 mg/dL (ref 0.0–1.0)
pH: 7.5 (ref 5.0–8.0)

## 2013-05-08 LAB — BASIC METABOLIC PANEL
BUN: 7 mg/dL (ref 6–23)
CO2: 29 mEq/L (ref 19–32)
Calcium: 9.4 mg/dL (ref 8.4–10.5)
Chloride: 93 mEq/L — ABNORMAL LOW (ref 96–112)
Creatinine, Ser: 0.41 mg/dL — ABNORMAL LOW (ref 0.50–1.10)
GFR calc Af Amer: >90 mL/min (ref >90)
GFR calc non Af Amer: >90 mL/min (ref >90)
Glucose, Bld: 156 mg/dL — ABNORMAL HIGH (ref 70–99)
Potassium: 3.2 mEq/L — ABNORMAL LOW (ref 3.5–5.1)
Sodium: 132 mEq/L — ABNORMAL LOW (ref 135–145)

## 2013-05-08 LAB — URINE MICROSCOPIC-ADD ON

## 2013-05-08 MED ORDER — ONDANSETRON 4 MG PO TBDP: 4.0000 mg | ORAL_TABLET | Freq: Three times a day (TID) | ORAL | Status: DC | PRN

## 2013-05-08 NOTE — ED Provider Notes (Signed)
History     CSN: [id number]  Arrival date & time 05/08/13  0303   First MD Initiated Contact with Patient 05/08/13 0355      Chief Complaint  Patient presents with  . Weakness    (Consider location/radiation/quality/duration/timing/severity/associated sxs/prior treatment) HPI HPI Comments: HPI Comments: [name] is a 79 y.o. female brought in by ambulance, who presents to the Emergency Department complaining of generalized weakness, dark stools, orthostatics positive prior to arrival. Patient had her appendix removed the first part of June and subsequently developed an ileus which required hospitalization for three weeks.She was discharged home with PT, OT and a nurse. She was last seen by her nurse of Friday. Both Saturday and _0  Systems reviewed and are negative for acute change except as noted in the HPI.  HENT: Negative for congestion.   Eyes: Negative for discharge and redness.  Respiratory: Negative for cough and shortness of breath.   Cardiovascular: Negative for chest pain.  Gastrointestinal: Positive for abdominal pain. Negative for vomiting.  Musculoskeletal: Negative for back pain.  Skin: Negative for rash.  Neurological: Positive for light-headedness. Negative for syncope, numbness and headaches.  Psychiatric/Behavioral:       No behavior change.    Allergies  Penicillins  Home Medications   Current Outpatient Rx  Name  Route  Sig  Dispense  Refill  . ALPRAZolam (NIRAVAM) 0.25 MG dissolvable tablet   Oral   Take 0.25 mg by mouth at bedtime  as needed.           Marland Kitchen amLODipine-benazepril (LOTREL) 5-20 MG per capsule   Oral   Take 1 capsule by mouth daily.           Marland Kitchen aspirin 325 MG tablet   Oral   Take 325 mg by mouth daily.           Marland Kitchen atorvastatin (LIPITOR) 10 MG tablet   Oral   Take 10 mg by mouth daily.           . calcium carbonate 200 MG capsule   Oral   Take 250 mg by mouth 2 (two) times daily with a meal.           . cholecalciferol (VITAMIN D) 1000 UNITS tablet   Oral   Take 1,000 Units by mouth daily.           Marland Kitchen docusate sodium (COLACE) 100 MG capsule   Oral   Take 300 mg by mouth daily as needed for constipation.         Marland Kitchen esomeprazole (NEXIUM) 40 MG capsule   Oral   Take 40 mg by mouth daily  before breakfast.           . glyBURIDE-metformin (GLUCOVANCE) 5-500 MG per tablet   Oral   Take 2 tablets by mouth 2 (two) times daily with a meal.           . HYDROcodone-acetaminophen (NORCO) 5-325 MG per tablet   Oral   Take 1-2 tablets by mouth every 4 (four) hours as needed for pain.   45 tablet   0   . HYDROcodone-acetaminophen (NORCO/VICODIN) 5-325 MG per tablet   Oral   Take 1 tablet by mouth every 4 (four) hours as needed for pain.   40 tablet   0   . indapamide (LOZOL) 1.25 MG tablet   Oral   Take 1.25 mg by mouth every morning.           . insulin glargine (LANTUS) 100 UNIT/ML injection   Subcutaneous   Inject 2-8 Units into the skin at bedtime. Based on what sugar readings are at bedtime.         . metroNIDAZOLE (FLAGYL) 500 MG tablet   Oral   Take 1 tablet (500 mg total) by mouth every 8 (eight) hours.   9 tablet   0   . tiotropium (SPIRIVA) 18 MCG inhalation capsule   Inhalation   Place 18 mcg into inhaler and inhale daily.           BP 116/57  Pulse 97  Temp(Src) 98.5 F (36.9 C) (Oral)  Resp 23  Ht _0  (1.626 m)  Wt 148 lb (67.132 kg)  BMI 25.39 kg/m2  SpO2 93%  Physical Exam  Nursing note and vitals reviewed. Constitutional: She appears well-developed and well-nourished.  Awake, alert, nontoxic appearance.  HENT:  Head: Normocephalic and atraumatic.  Eyes: EOM are normal. Pupils are equal, round, and reactive to light.  Neck: Normal range of motion. Neck supple.  Cardiovascular: Normal rate and intact distal pulses.   Pulmonary/Chest: Effort normal. She exhibits no tenderness.  Abdominal: Soft. Bowel sounds are normal. There is tenderness. There is no rebound.  Mild diffuse tenderness across the lower abdomen.  Genitourinary:  Brown, guaiac negative  Musculoskeletal: She exhibits no tenderness.  Baseline ROM, no obvious new focal weakness.  Neurological:  Mental status and motor strength appears baseline for patient and  situation.  Skin:  No rash noted.  Psychiatric: She has a normal mood and affect.    ED Course  Procedures (including critical care time) Results for orders placed during the hospital encounter of 05/08/13  CBC WITH DIFFERENTIAL      Result Value Range   WBC 4.7  4.0 - 10.5 K/uL   RBC 3.98  3.87 - 5.11 MIL/uL   Hemoglobin 13.0  12.0 - 15.0 g/dL   HCT 37.6  36.0 - 46.0 %   MCV 94.5  78.0 - 100.0 fL   MCH 32.7  26.0 - 34.0 pg   MCHC 34.6  30.0 - 36.0 g/dL   RDW 13.6  11.5 - 15.5 %   Platelets 431 (*) 150 - 400 K/uL   Neutrophils Relative % 51  43 - 77 %   Neutro Abs 2.4  1.7 - 7.7 K/uL   Lymphocytes Relative 37  12 - 46 %   Lymphs Abs 1.8  0.7 - 4.0 K/uL   Monocytes Relative 6  3 - 12 %   Monocytes Absolute 0.3  0.1 - 1.0 K/uL   Eosinophils Relative 5  0 - 5 %   Eosinophils Absolute 0.2  0.0 - 0.7 K/uL   Basophils Relative 2 (*) 0 - 1 %   Basophils Absolute 0.1  0.0 - 0.1 K/uL  BASIC METABOLIC PANEL      Result Value Range   Sodium 132 (*) 135 - 145 mEq/L   Potassium 3.2 (*) 3.5 - 5.1 mEq/L   Chloride 93 (*) 96 - 112 mEq/L   CO2 29  19 - 32 mEq/L   Glucose, Bld 156 (*) 70 - 99 mg/dL   BUN 7  6 - 23 mg/dL   Creatinine, Ser 0.41 (*) 0.50 - 1.10 mg/dL   Calcium 9.4  8.4 - 10.5 mg/dL   GFR calc non Af Amer >90  >90 mL/min   GFR calc Af Amer >90  >90 mL/min  URINALYSIS, ROUTINE W REFLEX MICROSCOPIC      Result Value Range   Color, Urine YELLOW  YELLOW   APPearance CLEAR  CLEAR   Specific Gravity, Urine 1.010  1.005 - 1.030   pH 7.5  5.0 - 8.0   Glucose, UA NEGATIVE  NEGATIVE mg/dL   Hgb urine dipstick TRACE (*) NEGATIVE   Bilirubin Urine NEGATIVE  NEGATIVE   Ketones, ur NEGATIVE  NEGATIVE mg/dL   Protein, ur TRACE (*) NEGATIVE mg/dL   Urobilinogen, UA 0.2  0.0 - 1.0 mg/dL   Nitrite NEGATIVE  NEGATIVE   Leukocytes, UA TRACE (*) NEGATIVE  URINE MICROSCOPIC-ADD ON      Result Value Range   Squamous Epithelial / LPF RARE  RARE   WBC, UA 0-2  <3 WBC/hpf   RBC / HPF 0-2   <3 RBC/hpf   Bacteria, UA RARE  RARE   Dg Abd Acute W/chest  05/08/2013   *RADIOLOGY REPORT*  Clinical Data: Abdominal pain.  Nausea, vomiting.  ACUTE ABDOMEN SERIES (ABDOMEN 2 VIEW & CHEST 1 VIEW)  Comparison: 04/19/2013  Findings: There is hyperinflation of the lungs compatible with COPD.  Right basilar airspace disease again noted.  Cannot exclude pneumonia.  Left lung is clear.  No effusions currently.  Heart is normal size.  IMPRESSION: COPD.  Right basilar atelectasis or infiltrate.   Original Report Authenticated By: Rolm Baptise, M.D.    [phone] Patient without abdominal pain. Reviewed the labs, xray with patient and her family. Discussed the use of supplements like ensure and  boost. They will speak to Dr. Legrand Rams about possible use of megace for appetite.  MDM  Patient presents with generalized weakness, dark stools, and abdominal pain. Labs are unremarkable. Acute abdominal series without evidence of ileus. With PT, OT and a nurse visiting the patient, she should continue to recover. Follow up with Dr. Legrand Rams. Pt stable in ED with no significant deterioration in condition.The patient appears reasonably screened and/or stabilized for discharge and I doubt any other medical condition or other [facility] requiring further screening, evaluation, or treatment in the ED at this time prior to discharge.  MDM Reviewed: nursing note and vitals Interpretation: labs and x-ray           Gypsy Balsam. Olin Hauser, MD 05/08/13 [phone]

## 2013-05-08 NOTE — ED Notes (Signed)
MD at bedside. 

## 2013-05-08 NOTE — ED Notes (Signed)
Per EMS: CBG was 161. Pt had positive orthostatics prior to arrival.

## 2013-05-08 NOTE — ED Notes (Addendum)
Per EMS: pt reports generalized weakness x 2 days. She has noticed dark stools since before she left the hospital.  Denies any pain, but occasional nausea.

## 2013-05-09 DIAGNOSIS — E119 Type 2 diabetes mellitus without complications: Secondary | ICD-10-CM | POA: Diagnosis not present

## 2013-05-09 DIAGNOSIS — I10 Essential (primary) hypertension: Secondary | ICD-10-CM | POA: Diagnosis not present

## 2013-05-09 DIAGNOSIS — F3289 Other specified depressive episodes: Secondary | ICD-10-CM | POA: Diagnosis not present

## 2013-05-09 DIAGNOSIS — F411 Generalized anxiety disorder: Secondary | ICD-10-CM | POA: Diagnosis not present

## 2013-05-09 DIAGNOSIS — K929 Disease of digestive system, unspecified: Secondary | ICD-10-CM | POA: Diagnosis not present

## 2013-05-09 DIAGNOSIS — F329 Major depressive disorder, single episode, unspecified: Secondary | ICD-10-CM | POA: Diagnosis not present

## 2013-05-09 DIAGNOSIS — Z48815 Encounter for surgical aftercare following surgery on the digestive system: Secondary | ICD-10-CM | POA: Diagnosis not present

## 2013-05-10 DIAGNOSIS — F3289 Other specified depressive episodes: Secondary | ICD-10-CM | POA: Diagnosis not present

## 2013-05-10 DIAGNOSIS — Z48815 Encounter for surgical aftercare following surgery on the digestive system: Secondary | ICD-10-CM | POA: Diagnosis not present

## 2013-05-10 DIAGNOSIS — F329 Major depressive disorder, single episode, unspecified: Secondary | ICD-10-CM | POA: Diagnosis not present

## 2013-05-10 DIAGNOSIS — E119 Type 2 diabetes mellitus without complications: Secondary | ICD-10-CM | POA: Diagnosis not present

## 2013-05-10 DIAGNOSIS — K929 Disease of digestive system, unspecified: Secondary | ICD-10-CM | POA: Diagnosis not present

## 2013-05-10 DIAGNOSIS — I10 Essential (primary) hypertension: Secondary | ICD-10-CM | POA: Diagnosis not present

## 2013-05-10 DIAGNOSIS — F411 Generalized anxiety disorder: Secondary | ICD-10-CM | POA: Diagnosis not present

## 2013-05-10 NOTE — Discharge Summary (Signed)
Physician Discharge Summary  Patient ID: [name] MRN: [id number] DOB/AGE: 12-08-1930 79 y.o.  Admit date: 04/13/2013 Discharge date: 04/14/2013  Admission Diagnoses: Acute appendicitis  Discharge Diagnoses: The same Active Problems:   * No active hospital problems. *   Discharged Condition: stable  Hospital Course: Patient presented to [facility] with abdominal pain. Workup and evaluation emergency department was consistent for acute appendicitis. She underwent a laparoscopic appendectomy without issues. She spent a brief period in the PACU and was transferred to a regular surgical floor. She was monitored overnight. The following day she was tolerating regular diet. Patient expressed passing flatus. She denies any fevers or chills. She is able toward. Patient requests discharge. Discharge plans discussed with patient and family.  Consults: None  Significant Diagnostic Studies: radiology: CT scan: Abdomen and pelvis  Treatments: surgery: Laparoscopic appendectomy  Discharge Exam: Blood pressure 141/55, pulse 97, temperature 98.5 F (36.9 C), temperature source Oral, resp. rate 18, height _0  (1.626 m), weight 67.132 kg (148 lb), SpO2 95.00%. General appearance: alert and no distress Resp: clear to auscultation bilaterally Cardio: regular rate and rhythm GI: Intermittent bowel sounds, soft, flat, expected postoperative tenderness. Incision is clean dry and intact.  Disposition: 01-Home or Self Care  Discharge Orders   Future Orders Complete By Expires     Call MD for:  persistant nausea and vomiting  As directed     Call MD for:  redness, tenderness, or signs of infection (pain, swelling, redness, odor or green/yellow discharge around incision site)  As directed     Call MD for:  severe uncontrolled pain  As directed     Call MD for:  temperature >100.4  As directed     Diet - low sodium heart healthy  As directed     Discharge instructions  As directed     Comments:      Increase activity as tolerated. May place ice pack for comfort.  Alternate an anti-inflammatory such as ibuprofen (Motrin, Advil) 400-662m every 6 hours with the prescribed pain medication.   Do not take any additional acetaminophen as there is Tylenol in the pain medication.    Discharge wound care:  As directed     Comments:      Clean surgical sites with soap and water.  May shower the morning after surgery unless instructed by Dr. ZGeroge Basemanotherwise.  No soaking for 2-3 weeks.    If adhesive strips are in place, they may be removed in 1-2 weeks while in the shower.    Driving Restrictions  As directed     Comments:      No driving while on pain medications.    Increase activity slowly  As directed     Lifting restrictions  As directed     Comments:      No lifting over 20lbs for 4-5 weeks post-op.        Medication List    TAKE these medications       ALPRAZolam 0.25 MG dissolvable tablet  Commonly known as:  NIRAVAM  Take 0.25 mg by mouth at bedtime as needed.     amLODipine-benazepril 5-20 MG per capsule  Commonly known as:  LOTREL  Take 1 capsule by mouth daily.     aspirin 325 MG tablet  Take 325 mg by mouth daily.     atorvastatin 10 MG tablet  Commonly known as:  LIPITOR  Take 10 mg by mouth daily.     calcium carbonate  200 MG capsule  Take 250 mg by mouth 2 (two) times daily with a meal.     cholecalciferol 1000 UNITS tablet  Commonly known as:  VITAMIN D  Take 1,000 Units by mouth daily.     esomeprazole 40 MG capsule  Commonly known as:  NEXIUM  Take 40 mg by mouth daily before breakfast.     glyBURIDE-metformin 5-500 MG per tablet  Commonly known as:  GLUCOVANCE  Take 2 tablets by mouth 2 (two) times daily with a meal.     HYDROcodone-acetaminophen 5-325 MG per tablet  Commonly known as:  NORCO  Take 1-2 tablets by mouth every 4 (four) hours as needed for pain.     indapamide 1.25 MG tablet  Commonly known as:  LOZOL  Take 1.25  mg by mouth every morning.     insulin glargine 100 UNIT/ML injection  Commonly known as:  LANTUS  Inject 2-8 Units into the skin at bedtime. Based on what sugar readings are at bedtime.     metroNIDAZOLE 500 MG tablet  Commonly known as:  FLAGYL  Take 1 tablet (500 mg total) by mouth every 8 (eight) hours.         Signed: Donato Heinz 05/10/2013, 12:12 PM

## 2013-05-11 DIAGNOSIS — F411 Generalized anxiety disorder: Secondary | ICD-10-CM | POA: Diagnosis not present

## 2013-05-11 DIAGNOSIS — I10 Essential (primary) hypertension: Secondary | ICD-10-CM | POA: Diagnosis not present

## 2013-05-11 DIAGNOSIS — Z48815 Encounter for surgical aftercare following surgery on the digestive system: Secondary | ICD-10-CM | POA: Diagnosis not present

## 2013-05-11 DIAGNOSIS — K929 Disease of digestive system, unspecified: Secondary | ICD-10-CM | POA: Diagnosis not present

## 2013-05-11 DIAGNOSIS — E119 Type 2 diabetes mellitus without complications: Secondary | ICD-10-CM | POA: Diagnosis not present

## 2013-05-11 DIAGNOSIS — F3289 Other specified depressive episodes: Secondary | ICD-10-CM | POA: Diagnosis not present

## 2013-05-11 DIAGNOSIS — F329 Major depressive disorder, single episode, unspecified: Secondary | ICD-10-CM | POA: Diagnosis not present

## 2013-05-15 DIAGNOSIS — K929 Disease of digestive system, unspecified: Secondary | ICD-10-CM | POA: Diagnosis not present

## 2013-05-15 DIAGNOSIS — I10 Essential (primary) hypertension: Secondary | ICD-10-CM | POA: Diagnosis not present

## 2013-05-15 DIAGNOSIS — Z48815 Encounter for surgical aftercare following surgery on the digestive system: Secondary | ICD-10-CM | POA: Diagnosis not present

## 2013-05-15 DIAGNOSIS — F411 Generalized anxiety disorder: Secondary | ICD-10-CM | POA: Diagnosis not present

## 2013-05-15 DIAGNOSIS — F329 Major depressive disorder, single episode, unspecified: Secondary | ICD-10-CM | POA: Diagnosis not present

## 2013-05-15 DIAGNOSIS — F3289 Other specified depressive episodes: Secondary | ICD-10-CM | POA: Diagnosis not present

## 2013-05-15 DIAGNOSIS — E119 Type 2 diabetes mellitus without complications: Secondary | ICD-10-CM | POA: Diagnosis not present

## 2013-05-16 DIAGNOSIS — Z48815 Encounter for surgical aftercare following surgery on the digestive system: Secondary | ICD-10-CM | POA: Diagnosis not present

## 2013-05-16 DIAGNOSIS — F411 Generalized anxiety disorder: Secondary | ICD-10-CM | POA: Diagnosis not present

## 2013-05-16 DIAGNOSIS — F3289 Other specified depressive episodes: Secondary | ICD-10-CM | POA: Diagnosis not present

## 2013-05-16 DIAGNOSIS — F329 Major depressive disorder, single episode, unspecified: Secondary | ICD-10-CM | POA: Diagnosis not present

## 2013-05-16 DIAGNOSIS — E119 Type 2 diabetes mellitus without complications: Secondary | ICD-10-CM | POA: Diagnosis not present

## 2013-05-16 DIAGNOSIS — I10 Essential (primary) hypertension: Secondary | ICD-10-CM | POA: Diagnosis not present

## 2013-05-16 DIAGNOSIS — K929 Disease of digestive system, unspecified: Secondary | ICD-10-CM | POA: Diagnosis not present

## 2013-05-18 DIAGNOSIS — E119 Type 2 diabetes mellitus without complications: Secondary | ICD-10-CM | POA: Diagnosis not present

## 2013-05-18 DIAGNOSIS — F411 Generalized anxiety disorder: Secondary | ICD-10-CM | POA: Diagnosis not present

## 2013-05-18 DIAGNOSIS — F329 Major depressive disorder, single episode, unspecified: Secondary | ICD-10-CM | POA: Diagnosis not present

## 2013-05-18 DIAGNOSIS — Z48815 Encounter for surgical aftercare following surgery on the digestive system: Secondary | ICD-10-CM | POA: Diagnosis not present

## 2013-05-18 DIAGNOSIS — F3289 Other specified depressive episodes: Secondary | ICD-10-CM | POA: Diagnosis not present

## 2013-05-18 DIAGNOSIS — K929 Disease of digestive system, unspecified: Secondary | ICD-10-CM | POA: Diagnosis not present

## 2013-05-18 DIAGNOSIS — I10 Essential (primary) hypertension: Secondary | ICD-10-CM | POA: Diagnosis not present

## 2013-05-22 DIAGNOSIS — F329 Major depressive disorder, single episode, unspecified: Secondary | ICD-10-CM | POA: Diagnosis not present

## 2013-05-22 DIAGNOSIS — E119 Type 2 diabetes mellitus without complications: Secondary | ICD-10-CM | POA: Diagnosis not present

## 2013-05-22 DIAGNOSIS — K929 Disease of digestive system, unspecified: Secondary | ICD-10-CM | POA: Diagnosis not present

## 2013-05-22 DIAGNOSIS — F411 Generalized anxiety disorder: Secondary | ICD-10-CM | POA: Diagnosis not present

## 2013-05-22 DIAGNOSIS — I10 Essential (primary) hypertension: Secondary | ICD-10-CM | POA: Diagnosis not present

## 2013-05-22 DIAGNOSIS — Z48815 Encounter for surgical aftercare following surgery on the digestive system: Secondary | ICD-10-CM | POA: Diagnosis not present

## 2013-05-22 DIAGNOSIS — F3289 Other specified depressive episodes: Secondary | ICD-10-CM | POA: Diagnosis not present

## 2013-05-25 DIAGNOSIS — F411 Generalized anxiety disorder: Secondary | ICD-10-CM | POA: Diagnosis not present

## 2013-05-25 DIAGNOSIS — F3289 Other specified depressive episodes: Secondary | ICD-10-CM | POA: Diagnosis not present

## 2013-05-25 DIAGNOSIS — K929 Disease of digestive system, unspecified: Secondary | ICD-10-CM | POA: Diagnosis not present

## 2013-05-25 DIAGNOSIS — Z48815 Encounter for surgical aftercare following surgery on the digestive system: Secondary | ICD-10-CM | POA: Diagnosis not present

## 2013-05-25 DIAGNOSIS — F329 Major depressive disorder, single episode, unspecified: Secondary | ICD-10-CM | POA: Diagnosis not present

## 2013-05-25 DIAGNOSIS — E119 Type 2 diabetes mellitus without complications: Secondary | ICD-10-CM | POA: Diagnosis not present

## 2013-05-25 DIAGNOSIS — I10 Essential (primary) hypertension: Secondary | ICD-10-CM | POA: Diagnosis not present

## 2013-05-26 DIAGNOSIS — E119 Type 2 diabetes mellitus without complications: Secondary | ICD-10-CM | POA: Diagnosis not present

## 2013-05-30 ENCOUNTER — Encounter (HOSPITAL_COMMUNITY): Payer: Self-pay

## 2013-05-30 ENCOUNTER — Inpatient Hospital Stay (HOSPITAL_COMMUNITY)
Admission: EM | Admit: 2013-05-30 | Discharge: 2013-06-01 | DRG: 641 | Disposition: A | Discharge status: Home / Self Care | Payer: Medicare Other | Attending: Internal Medicine | Admitting: Internal Medicine

## 2013-05-30 ENCOUNTER — Emergency Department (HOSPITAL_COMMUNITY): Payer: Medicare Other

## 2013-05-30 DIAGNOSIS — F3289 Other specified depressive episodes: Secondary | ICD-10-CM | POA: Diagnosis present

## 2013-05-30 DIAGNOSIS — I2510 Atherosclerotic heart disease of native coronary artery without angina pectoris: Secondary | ICD-10-CM | POA: Diagnosis present

## 2013-05-30 DIAGNOSIS — M899 Disorder of bone, unspecified: Secondary | ICD-10-CM | POA: Diagnosis present

## 2013-05-30 DIAGNOSIS — E871 Hypo-osmolality and hyponatremia: Principal | ICD-10-CM | POA: Diagnosis present

## 2013-05-30 DIAGNOSIS — I959 Hypotension, unspecified: Secondary | ICD-10-CM | POA: Diagnosis present

## 2013-05-30 DIAGNOSIS — I951 Orthostatic hypotension: Secondary | ICD-10-CM | POA: Diagnosis present

## 2013-05-30 DIAGNOSIS — F329 Major depressive disorder, single episode, unspecified: Secondary | ICD-10-CM | POA: Diagnosis present

## 2013-05-30 DIAGNOSIS — I25119 Atherosclerotic heart disease of native coronary artery with unspecified angina pectoris: Secondary | ICD-10-CM | POA: Diagnosis present

## 2013-05-30 DIAGNOSIS — E1165 Type 2 diabetes mellitus with hyperglycemia: Secondary | ICD-10-CM

## 2013-05-30 DIAGNOSIS — I10 Essential (primary) hypertension: Secondary | ICD-10-CM | POA: Diagnosis present

## 2013-05-30 DIAGNOSIS — E860 Dehydration: Secondary | ICD-10-CM

## 2013-05-30 DIAGNOSIS — E785 Hyperlipidemia, unspecified: Secondary | ICD-10-CM | POA: Diagnosis present

## 2013-05-30 DIAGNOSIS — Z9010 Acquired absence of unspecified breast and nipple: Secondary | ICD-10-CM

## 2013-05-30 DIAGNOSIS — Z9181 History of falling: Secondary | ICD-10-CM | POA: Diagnosis not present

## 2013-05-30 DIAGNOSIS — Z79899 Other long term (current) drug therapy: Secondary | ICD-10-CM

## 2013-05-30 DIAGNOSIS — IMO0001 Reserved for inherently not codable concepts without codable children: Secondary | ICD-10-CM | POA: Diagnosis not present

## 2013-05-30 DIAGNOSIS — E119 Type 2 diabetes mellitus without complications: Secondary | ICD-10-CM | POA: Diagnosis present

## 2013-05-30 DIAGNOSIS — Z794 Long term (current) use of insulin: Secondary | ICD-10-CM

## 2013-05-30 DIAGNOSIS — Z87891 Personal history of nicotine dependence: Secondary | ICD-10-CM | POA: Diagnosis not present

## 2013-05-30 DIAGNOSIS — R0602 Shortness of breath: Secondary | ICD-10-CM | POA: Diagnosis not present

## 2013-05-30 DIAGNOSIS — E878 Other disorders of electrolyte and fluid balance, not elsewhere classified: Secondary | ICD-10-CM | POA: Diagnosis not present

## 2013-05-30 DIAGNOSIS — F411 Generalized anxiety disorder: Secondary | ICD-10-CM | POA: Diagnosis present

## 2013-05-30 DIAGNOSIS — E876 Hypokalemia: Secondary | ICD-10-CM | POA: Diagnosis present

## 2013-05-30 DIAGNOSIS — T502X5A Adverse effect of carbonic-anhydrase inhibitors, benzothiadiazides and other diuretics, initial encounter: Secondary | ICD-10-CM | POA: Diagnosis present

## 2013-05-30 DIAGNOSIS — R000 Tachycardia, unspecified: Secondary | ICD-10-CM | POA: Diagnosis not present

## 2013-05-30 DIAGNOSIS — Z853 Personal history of malignant neoplasm of breast: Secondary | ICD-10-CM | POA: Diagnosis not present

## 2013-05-30 DIAGNOSIS — I498 Other specified cardiac arrhythmias: Secondary | ICD-10-CM | POA: Diagnosis not present

## 2013-05-30 DIAGNOSIS — R5381 Other malaise: Secondary | ICD-10-CM | POA: Diagnosis not present

## 2013-05-30 DIAGNOSIS — W19XXXA Unspecified fall, initial encounter: Secondary | ICD-10-CM

## 2013-05-30 LAB — CBC WITH DIFFERENTIAL/PLATELET
Basophils Absolute: 0.1 10*3/uL (ref 0.0–0.1)
Basophils Relative: 1 % (ref 0–1)
Eosinophils Absolute: 0 10*3/uL (ref 0.0–0.7)
Eosinophils Relative: 1 % (ref 0–5)
HCT: 35.5 % — ABNORMAL LOW (ref 36.0–46.0)
Hemoglobin: 12.4 g/dL (ref 12.0–15.0)
Lymphocytes Relative: 32 % (ref 12–46)
Lymphs Abs: 1.9 10*3/uL (ref 0.7–4.0)
MCH: 32.8 pg (ref 26.0–34.0)
MCHC: 34.9 g/dL (ref 30.0–36.0)
MCV: 93.9 fL (ref 78.0–100.0)
Monocytes Absolute: 0.2 10*3/uL (ref 0.1–1.0)
Monocytes Relative: 4 % (ref 3–12)
Neutro Abs: 3.7 10*3/uL (ref 1.7–7.7)
Neutrophils Relative %: 63 % (ref 43–77)
Platelets: 331 10*3/uL (ref 150–400)
RBC: 3.78 MIL/uL — ABNORMAL LOW (ref 3.87–5.11)
RDW: 13.8 % (ref 11.5–15.5)
WBC: 5.9 10*3/uL (ref 4.0–10.5)

## 2013-05-30 LAB — COMPREHENSIVE METABOLIC PANEL
ALT: 19 U/L (ref 0–35)
AST: 27 U/L (ref 0–37)
Albumin: 3.5 g/dL (ref 3.5–5.2)
Alkaline Phosphatase: 128 U/L — ABNORMAL HIGH (ref 39–117)
BUN: 13 mg/dL (ref 6–23)
CO2: 26 mEq/L (ref 19–32)
Calcium: 9.3 mg/dL (ref 8.4–10.5)
Chloride: 90 mEq/L — ABNORMAL LOW (ref 96–112)
Creatinine, Ser: 0.53 mg/dL (ref 0.50–1.10)
GFR calc Af Amer: >90 mL/min (ref >90)
GFR calc non Af Amer: 87 mL/min — ABNORMAL LOW (ref >90)
Glucose, Bld: 187 mg/dL — ABNORMAL HIGH (ref 70–99)
Potassium: 3.6 mEq/L (ref 3.5–5.1)
Sodium: 127 mEq/L — ABNORMAL LOW (ref 135–145)
Total Bilirubin: 0.6 mg/dL (ref 0.3–1.2)
Total Protein: 7.2 g/dL (ref 6.0–8.3)

## 2013-05-30 LAB — URINALYSIS, ROUTINE W REFLEX MICROSCOPIC
Bilirubin Urine: NEGATIVE
Glucose, UA: 100 mg/dL — AB
Hgb urine dipstick: NEGATIVE
Ketones, ur: NEGATIVE mg/dL
Nitrite: NEGATIVE
Protein, ur: NEGATIVE mg/dL
Specific Gravity, Urine: 1.015 (ref 1.005–1.030)
Urobilinogen, UA: 0.2 mg/dL (ref 0.0–1.0)
pH: 7 (ref 5.0–8.0)

## 2013-05-30 LAB — LIPID PANEL
Cholesterol: 131 mg/dL (ref 0–200)
HDL: 52 mg/dL (ref >39)
LDL Cholesterol: 67 mg/dL (ref 0–99)
Total CHOL/HDL Ratio: 2.5 RATIO
Triglycerides: 59 mg/dL (ref <150)
VLDL: 12 mg/dL (ref 0–40)

## 2013-05-30 LAB — GLUCOSE, CAPILLARY
Glucose-Capillary: 247 mg/dL — ABNORMAL HIGH (ref 70–99)
Glucose-Capillary: 54 mg/dL — ABNORMAL LOW (ref 70–99)
Glucose-Capillary: 95 mg/dL (ref 70–99)
Glucose-Capillary: 116 mg/dL — ABNORMAL HIGH (ref 70–99)

## 2013-05-30 LAB — TROPONIN I
Troponin I: <0.3 ng/mL (ref <0.30)
Troponin I: <0.3 ng/mL (ref <0.30)

## 2013-05-30 LAB — URINE MICROSCOPIC-ADD ON

## 2013-05-30 MED ORDER — ENOXAPARIN SODIUM 40 MG/0.4ML ~~LOC~~ SOLN
40.0000 mg | SUBCUTANEOUS | Status: DC
  Administered 2013-05-30 – 2013-05-31 (×2): 40 mg via SUBCUTANEOUS
  Filled 2013-05-30 (×2): qty 0.4

## 2013-05-30 MED ORDER — ASPIRIN 325 MG PO TABS
325.0000 mg | ORAL_TABLET | Freq: Every day | ORAL | Status: DC
  Administered 2013-05-31 – 2013-06-01 (×2): 325 mg via ORAL
  Filled 2013-05-30 (×2): qty 1

## 2013-05-30 MED ORDER — SODIUM CHLORIDE 0.9 % IV BOLUS (SEPSIS)
1000.0000 mL | Freq: Once | INTRAVENOUS | Status: AC
  Administered 2013-05-30: 1000 mL via INTRAVENOUS

## 2013-05-30 MED ORDER — ACETAMINOPHEN 325 MG PO TABS: 650.0000 mg | ORAL_TABLET | Freq: Four times a day (QID) | ORAL | Status: DC | PRN

## 2013-05-30 MED ORDER — DOCUSATE SODIUM 100 MG PO CAPS: 100.0000 mg | ORAL_CAPSULE | Freq: Every day | ORAL | Status: DC | PRN

## 2013-05-30 MED ORDER — PANTOPRAZOLE SODIUM 40 MG PO TBEC
40.0000 mg | DELAYED_RELEASE_TABLET | Freq: Every day | ORAL | Status: DC
  Administered 2013-05-30 – 2013-06-01 (×3): 40 mg via ORAL
  Filled 2013-05-30 (×3): qty 1

## 2013-05-30 MED ORDER — SODIUM CHLORIDE 0.9 % IV SOLN
INTRAVENOUS | Status: DC
  Administered 2013-05-30 – 2013-06-01 (×2): via INTRAVENOUS

## 2013-05-30 MED ORDER — ACETAMINOPHEN 650 MG RE SUPP: 650.0000 mg | Freq: Four times a day (QID) | RECTAL | Status: DC | PRN

## 2013-05-30 MED ORDER — ALUM & MAG HYDROXIDE-SIMETH 200-200-20 MG/5ML PO SUSP: 30.0000 mL | Freq: Four times a day (QID) | ORAL | Status: DC | PRN

## 2013-05-30 MED ORDER — ALPRAZOLAM 0.25 MG PO TBDP
0.2500 mg | ORAL_TABLET | Freq: Every evening | ORAL | Status: DC | PRN
  Filled 2013-05-30: qty 1

## 2013-05-30 MED ORDER — SODIUM CHLORIDE 0.9 % IJ SOLN
3.0000 mL | Freq: Two times a day (BID) | INTRAMUSCULAR | Status: DC
  Administered 2013-05-31 – 2013-06-01 (×3): 3 mL via INTRAVENOUS

## 2013-05-30 MED ORDER — HYDROCODONE-ACETAMINOPHEN 5-325 MG PO TABS
1.0000 | ORAL_TABLET | ORAL | Status: DC | PRN
  Administered 2013-05-31 (×2): 1 via ORAL
  Filled 2013-05-30: qty 2
  Filled 2013-05-30: qty 1

## 2013-05-30 MED ORDER — ASPIRIN 325 MG PO TABS: 325.0000 mg | ORAL_TABLET | Freq: Every day | ORAL | Status: DC

## 2013-05-30 MED ORDER — HYDROMORPHONE HCL PF 1 MG/ML IJ SOLN: 0.5000 mg | INTRAMUSCULAR | Status: DC | PRN

## 2013-05-30 MED ORDER — ACETAMINOPHEN 500 MG PO TABS: 500.0000 mg | ORAL_TABLET | Freq: Four times a day (QID) | ORAL | Status: DC | PRN

## 2013-05-30 MED ORDER — INSULIN ASPART 100 UNIT/ML ~~LOC~~ SOLN
0.0000 [IU] | Freq: Three times a day (TID) | SUBCUTANEOUS | Status: DC
  Administered 2013-05-31: 3 [IU] via SUBCUTANEOUS
  Administered 2013-05-31: 2 [IU] via SUBCUTANEOUS
  Administered 2013-05-31: 5 [IU] via SUBCUTANEOUS
  Administered 2013-06-01: 2 [IU] via SUBCUTANEOUS

## 2013-05-30 MED ORDER — ONDANSETRON HCL 4 MG PO TABS: 4.0000 mg | ORAL_TABLET | Freq: Four times a day (QID) | ORAL | Status: DC | PRN

## 2013-05-30 MED ORDER — ATORVASTATIN CALCIUM 10 MG PO TABS
10.0000 mg | ORAL_TABLET | Freq: Every day | ORAL | Status: DC
Start: 2013-05-30 — End: 2013-06-01
  Administered 2013-05-30 – 2013-06-01 (×3): 10 mg via ORAL
  Filled 2013-05-30 (×3): qty 1

## 2013-05-30 MED ORDER — TIOTROPIUM BROMIDE MONOHYDRATE 18 MCG IN CAPS
18.0000 ug | ORAL_CAPSULE | Freq: Every day | RESPIRATORY_TRACT | Status: DC
Start: 2013-05-30 — End: 2013-06-01
  Administered 2013-05-31 – 2013-06-01 (×2): 18 ug via RESPIRATORY_TRACT
  Filled 2013-05-30: qty 5

## 2013-05-30 MED ORDER — ALPRAZOLAM 0.25 MG PO TABS
0.2500 mg | ORAL_TABLET | Freq: Every evening | ORAL | Status: DC | PRN
  Administered 2013-05-31: 0.25 mg via ORAL
  Filled 2013-05-30: qty 1

## 2013-05-30 MED ORDER — SODIUM CHLORIDE 0.9 % IV SOLN
INTRAVENOUS | Status: AC
  Administered 2013-05-30: 19:00:00 via INTRAVENOUS

## 2013-05-30 MED ORDER — ONDANSETRON HCL 4 MG/2ML IJ SOLN: 4.0000 mg | Freq: Four times a day (QID) | INTRAMUSCULAR | Status: DC | PRN

## 2013-05-30 NOTE — ED Provider Notes (Signed)
History  This chart was scribed for Sharyon Cable, MD by Jenne Campus, ED Scribe. This patient was seen in room APA05/APA05 and the patient's care was started at 12:09 PM.  CSN: [id number] Arrival date & time 05/30/13  1135  First MD Initiated Contact with Patient 05/30/13 1209     Chief Complaint  Patient presents with  . Weakness    Patient is a 79 y.o. female presenting with weakness. The history is provided by the patient. No language interpreter was used.  Weakness This is a new problem. The current episode started more than 1 week ago. The problem occurs constantly. Associated symptoms include shortness of breath. Pertinent negatives include no chest pain, no abdominal pain and no headaches. She has tried nothing for the symptoms.    HPI Comments: [name] is a 79 y.o. female who presents to the Emergency Department complaining of a fall described as legs giving out while standing in the kitchen. She reports head trauma stating that she bumped her head against the wall but denies LOC. Pt admits that she felt lightheaded prior to the fall and reports prior episodes of the same with today's episode being the most severe. Family states that the pt has been experiencing one month of weakness since being discharged after appendectomy. She was evaluated by her PCP this morning and was told she had a low BP and an elevated HR in the 120s. She was then sent to the ED for further evaluation. She reports SOB but denies HA and CP currently.  Past Medical History  Diagnosis Date  . Hemorrhage of gastrointestinal tract, unspecified   . Other malaise and fatigue   . Tobacco use disorder   . Parotid tumor   . Neoplasm of malignant     breast  ,HX   . Diabetes mellitus   . Osteopenia   . Hypertension   . Hyperlipidemia   . Depression   . Anxiety state, unspecified    Past Surgical History  Procedure Laterality Date  . Mastectomy  1994    left breast -related to breast cancer   . Vesicovaginal fistula closure w/ tah  1979  . Cholecystectomy  2008  . Hemorrhoid surgery  1960s  . Laparoscopic appendectomy N/A 04/13/2013    Procedure: APPENDECTOMY LAPAROSCOPIC;  Surgeon: Donato Heinz, MD;  Location: [facility];  Service: General;  Laterality: N/A;  . Appendectomy     Family History  Problem Relation Age of Onset  . Heart failure Mother     CHF  . Cirrhosis Brother   . Lung cancer Father     brain mets  . Kidney cancer Mother   . Kidney cancer Sister    History  Substance Use Topics  . Smoking status: Former Smoker -- 0.50 packs/day for 40 years    Types: Cigarettes  . Smokeless tobacco: Not on file  . Alcohol Use: No   No OB history provided.  Review of Systems  Constitutional: Negative for fever.  Respiratory: Positive for shortness of breath.   Cardiovascular: Negative for chest pain.  Gastrointestinal: Negative for nausea, vomiting, abdominal pain, diarrhea and blood in stool.  Skin: Negative for wound.  Neurological: Positive for weakness and light-headedness. Negative for dizziness, syncope and headaches.  All other systems reviewed and are negative.    Allergies  Penicillins  Home Medications   Current Outpatient Rx  Name  Route  Sig  Dispense  Refill  . ALPRAZolam (NIRAVAM) 0.25 MG dissolvable tablet  Oral   Take 0.25 mg by mouth at bedtime as needed.           Marland Kitchen amLODipine-benazepril (LOTREL) 5-20 MG per capsule   Oral   Take 1 capsule by mouth daily.           Marland Kitchen aspirin 325 MG tablet   Oral   Take 325 mg by mouth daily.           Marland Kitchen atorvastatin (LIPITOR) 10 MG tablet   Oral   Take 10 mg by mouth daily.           . calcium carbonate 200 MG capsule   Oral   Take 250 mg by mouth 2 (two) times daily with a meal.           . cholecalciferol (VITAMIN D) 1000 UNITS tablet   Oral   Take 1,000 Units by mouth daily.           Marland Kitchen docusate sodium (COLACE) 100 MG capsule   Oral   Take 300 mg by mouth daily as needed  for constipation.         Marland Kitchen esomeprazole (NEXIUM) 40 MG capsule   Oral   Take 40 mg by mouth daily before breakfast.           . glyBURIDE-metformin (GLUCOVANCE) 5-500 MG per tablet   Oral   Take 2 tablets by mouth 2 (two) times daily with a meal.           . HYDROcodone-acetaminophen (NORCO) 5-325 MG per tablet   Oral   Take 1-2 tablets by mouth every 4 (four) hours as needed for pain.   45 tablet   0   . HYDROcodone-acetaminophen (NORCO/VICODIN) 5-325 MG per tablet   Oral   Take 1 tablet by mouth every 4 (four) hours as needed for pain.   40 tablet   0   . indapamide (LOZOL) 1.25 MG tablet   Oral   Take 1.25 mg by mouth every morning.           . insulin glargine (LANTUS) 100 UNIT/ML injection   Subcutaneous   Inject 2-8 Units into the skin at bedtime. Based on what sugar readings are at bedtime.         . metroNIDAZOLE (FLAGYL) 500 MG tablet   Oral   Take 1 tablet (500 mg total) by mouth every 8 (eight) hours.   9 tablet   0   . ondansetron (ZOFRAN ODT) 4 MG disintegrating tablet   Oral   Take 1 tablet (4 mg total) by mouth every 8 (eight) hours as needed for nausea.   20 tablet   0   . tiotropium (SPIRIVA) 18 MCG inhalation capsule   Inhalation   Place 18 mcg into inhaler and inhale daily.          Triage Vitals: BP 101/61  Pulse 128  Temp(Src) 97.7 F (36.5 C) (Oral)  Resp 18  Ht _0  (1.626 m)  Wt 139 lb (63.05 kg)  BMI 23.85 kg/m2  SpO2 95% BP 129/65  Pulse 77  Temp(Src) 97.7 F (36.5 C) (Oral)  Resp 18  Ht _1  (1.626 m)  Wt 139 lb (63.05 kg)  BMI 23.85 kg/m2  SpO2 97%   Physical Exam  Nursing note and vitals reviewed.  CONSTITUTIONAL: Well developed/well nourished HEAD: Normocephalic/atraumatic EYES: EOMI/PERRL ENMT: Mucous membranes dry NECK: supple no meningeal signs SPINE:entire spine nontender CV: tachycardic S1/S2 noted, no murmurs/rubs/gallops noted LUNGS: Lungs are  clear to auscultation bilaterally, no apparent  distress ABDOMEN: soft, nontender, no rebound or guarding NEURO: Pt is awake/alert, moves all extremitiesx4, no focal weakness noted EXTREMITIES: pulses normal, full ROM, no deformity or trauma noted SKIN: warm, color normal PSYCH: no abnormalities of mood noted  ED Course  Procedures DIAGNOSTIC STUDIES: Oxygen Saturation is 95% on room air, normal by my interpretation.    COORDINATION OF CARE: 12:21 PM-Discussed treatment plan which includes CXR, CBC panel, CMP and UA with pt at bedside and pt agreed to plan.   3:18 PM Pt with orthostatic hyotension, she is dehydrated/hyponatremic Will admit for rehydration Her vitals improved upon lying in bed Pt agreeable with plan D/w dr Sarajane Jews, will admit to tele OBS for dr Legrand Rams  MDM  Nursing notes including past medical history and social history reviewed and considered in documentation xrays reviewed and considered Labs/vital reviewed and considered       Date: 05/30/2013  Rate: 119  Rhythm: indeterminate  QRS Axis: normal  Intervals: normal  ST/T Wave abnormalities: nonspecific ST changes  Conduction Disutrbances:none  Narrative Interpretation:   Old EKG Reviewed: changes noted rate is faster on today's ekg     I personally performed the services described in this documentation, which was scribed in my presence. The recorded information has been reviewed and is accurate.       Sharyon Cable, MD 05/30/13 [phone]

## 2013-05-30 NOTE — H&P (Signed)
[facility] History and Physical  [name] FAO:[id number] DOB: [birth year] DOA: 05/30/2013 Referring physician:  PCP: [name], MD  Specialists:   Chief Complaint: fall  HPI: [name] is a delightful 79 y.o. female with past medical history that includes diabetes, hypertension, CAD, recent appendectomy complicated by ileus, breast cancer who presents to the emergency department with the chief complaint of fall. Information is obtained from the patient and the daughter who is at the bedside. Daughter reports that she was assisting patients him getting ready for doctor's appointment this morning. Patient ambulated a short distance to the bathroom and fell. She states that her "legs just gave way". She denies any loss of consciousness. She denies any chest pain palpitation dizziness headache visual disturbances prior to the fall. She reports she just got up out of her chair and walk to the bathroom and her legs buckled. She also reports that she has experienced mild lightheadedness when getting up out of a chair recently. She did hit her head on the wall. She went on to her PCPs office and was told she had a low blood pressure and heart rate. Was referred to the emergency department for further evaluation. Daughter also reports that since patient's surgery in may she has suffered from decreased appetite and her by mouth intake is less. Patient denies any abdominal pain nausea vomiting diarrhea she just states that she has no appetite. Lab work in the emergency room is significant for sodium of 127 chloride 90 serum glucose 187 alkaline phosphatase 128 and blood pressure of 103/50. Vital signs significant for a heart rate of 128 Urinalysis is unremarkable. Chest x-ray reveals no cardiopulmonary disease. EKG yields   Accelerated Junctional rhythm with retrograde conduction abnormal ECG When compared with ECG of 13-Apr-2013 01:43, Junctional rhythm has replaced Sinus rhythm T wave amplitude  has increased in Inferior. Symptoms came on suddenly have resolved characterized as moderate. hospitalists were asked to admit   Review of Systems: The patient denies  fever,  vision loss, decreased hearing, hoarseness, chest pain, syncope, dyspnea on exertion, peripheral edema, balance deficits, hemoptysis, abdominal pain, melena, hematochezia, severe indigestion/heartburn, hematuria, incontinence, genital sores, muscle weakness, suspicious skin lesions, transient blindness, depression, unusual weight change, abnormal bleeding, enlarged lymph nodes, angioedema, and breast masses.    Past Medical History  Diagnosis Date  . Hemorrhage of gastrointestinal tract, unspecified   . Other malaise and fatigue   . Tobacco use disorder   . Parotid tumor   . Neoplasm of malignant     breast  ,HX   . Diabetes mellitus   . Osteopenia   . Hypertension   . Hyperlipidemia   . Depression   . Anxiety state, unspecified    Past Surgical History  Procedure Laterality Date  . Mastectomy  1994    left breast -related to breast cancer  . Vesicovaginal fistula closure w/ tah  1979  . Cholecystectomy  2008  . Hemorrhoid surgery  1960s  . Laparoscopic appendectomy N/A 04/13/2013    Procedure: APPENDECTOMY LAPAROSCOPIC;  Surgeon: Donato Heinz, MD;  Location: [facility];  Service: General;  Laterality: N/A;  . Appendectomy     Social History:  reports that she has quit smoking. Her smoking use included Cigarettes. She has a 20 pack-year smoking history. She does not have any smokeless tobacco history on file. She reports that she does not drink alcohol or use illicit drugs. And lives with her daughter she is a retired Engineer, manufacturing systems. She most recently  uses a walker for ambulation but prior to her hospitalization in May of this year she ambulated without assistance. She is fairly independent with ADLs Allergies  Allergen Reactions  . Penicillins Rash    Family History  Problem Relation Age of Onset  .  Heart failure Mother     CHF  . Cirrhosis Brother   . Lung cancer Father     brain mets  . Kidney cancer Mother   . Kidney cancer Sister      Prior to Admission medications   Medication Sig Start Date End Date Taking? Authorizing Provider  acetaminophen (TYLENOL) 500 MG tablet Take 500 mg by mouth every 6 (six) hours as needed for pain.   Yes Historical Provider, MD  ALPRAZolam (NIRAVAM) 0.25 MG dissolvable tablet Take 0.25 mg by mouth at bedtime as needed.     Yes Historical Provider, MD  amLODipine-benazepril (LOTREL) 5-20 MG per capsule Take 1 capsule by mouth daily.     Yes Historical Provider, MD  aspirin 325 MG tablet Take 325 mg by mouth daily.     Yes Historical Provider, MD  atorvastatin (LIPITOR) 10 MG tablet Take 10 mg by mouth daily.     Yes Historical Provider, MD  CALCIUM CARBONATE PO Take 1,000 mg by mouth daily.   Yes Historical Provider, MD  cholecalciferol (VITAMIN D) 1000 UNITS tablet Take 1,000 Units by mouth daily.     Yes Historical Provider, MD  docusate sodium (COLACE) 100 MG capsule Take 300 mg by mouth daily as needed for constipation.   Yes Historical Provider, MD  esomeprazole (NEXIUM) 40 MG capsule Take 40 mg by mouth daily before breakfast.     Yes Historical Provider, MD  glyBURIDE-metformin (GLUCOVANCE) 5-500 MG per tablet Take 1 tablet by mouth 2 (two) times daily with a meal.    Yes Historical Provider, MD  HYDROcodone-acetaminophen (NORCO) 5-325 MG per tablet Take 1-2 tablets by mouth every 4 (four) hours as needed for pain. 04/14/13  Yes Donato Heinz, MD  indapamide (LOZOL) 1.25 MG tablet Take 1.25 mg by mouth every morning.     Yes Historical Provider, MD  insulin glargine (LANTUS) 100 UNIT/ML injection Inject 2-8 Units into the skin at bedtime. Give if BS 140-150= 3 units, 151-160=4 units, 161-170=5 units, 171-180= 6 units, >181=7units   Yes Historical Provider, MD  tiotropium (SPIRIVA) 18 MCG inhalation capsule Place 18 mcg into inhaler and inhale  daily.   Yes Historical Provider, MD   Physical Exam: Filed Vitals:   05/30/13 1348 05/30/13 1349 05/30/13 1352 05/30/13 1354  BP: 129/65 103/54 85/41 129/65  Pulse: 79 81 95 77  Temp:      TempSrc:      Resp:    18  Height:      Weight:      SpO2:    97%     General:  Well-nourished no acute distress  Eyes: PE RRL, EOMI, no scleral icterus  ENT: Ears clear nose without drainage oropharynx without erythema or exudate. Mucous membranes of her mouth are pink and dry.  Neck: Supple no JVD full range of motion no lymphadenopathy  Cardiovascular: Regular rate and rhythm no murmur gallop or rub no lower extremity edema pedal pulses present and palpable  Respiratory: Normal effort breath sounds clear to auscultation bilaterally no wheeze no rhonchi  Abdomen: Round soft positive bowel sounds nontender to palpation no guarding no mass organomegaly noted  Skin: Warm and dry no rash no lesions  Musculoskeletal: Moves  all extremities joints without swelling or erythema  Psychiatric: Soft-spoken calm cooperative  Neurologic: Cranial nerves II through XII grossly intact speech clear facial symmetry  Labs on Admission:  Basic Metabolic Panel:  Recent Labs Lab 05/30/13 1306  NA 127*  K 3.6  CL 90*  CO2 26  GLUCOSE 187*  BUN 13  CREATININE 0.53  CALCIUM 9.3   Liver Function Tests:  Recent Labs Lab 05/30/13 1306  AST 27  ALT 19  ALKPHOS 128*  BILITOT 0.6  PROT 7.2  ALBUMIN 3.5   No results found for this basename: LIPASE, AMYLASE,  in the last 168 hours No results found for this basename: AMMONIA,  in the last 168 hours CBC:  Recent Labs Lab 05/30/13 1306  WBC 5.9  NEUTROABS 3.7  HGB 12.4  HCT 35.5*  MCV 93.9  PLT 331   Cardiac Enzymes:  Recent Labs Lab 05/30/13 1306  TROPONINI <0.30    BNP (last 3 results) No results found for this basename: PROBNP,  in the last 8760 hours CBG:  Recent Labs Lab 05/30/13 1155  GLUCAP 247*    Radiological  Exams on Admission: Dg Chest Portable 1 View  05/30/2013   *RADIOLOGY REPORT*  Clinical Data: Short of breath  PORTABLE CHEST - 1 VIEW  Comparison: Prior chest x-ray 04/19/2013; acute abdominal series including frontal chest x-ray 05/08/2013  Findings: Continued improvement and patchy right lower lobe airspace disease over the past to chest x-rays.  There may be a small residual right pleural effusion.  Borderline enlarged heart stable compared to prior.  Trace atherosclerotic calcification of the transverse aorta.  Prominence of the interstitial markings and pulmonary vascular congestion are unchanged.  No acute osseous abnormality.  No pneumothorax.  IMPRESSION:  1.  No acute cardiopulmonary disease. 2.  Continued slight interval improvement and right lower lobe opacity.  There may be a small right pleural effusion.   Original Report Authenticated By: Jacqulynn Cadet, M.D.    EKG: See of the  Assessment/Plan Principal Problem:   Hypotension: Likely related to decreased by mouth intake in the setting of diuretics. Will admit to telemetry we'll provide IV hydration. Will hold any diuretics as well as any antihypertensives. Will monitor closely. Active Problems: Hyponatremia: Likely related to dehydration from decreased by mouth intake and diuretics. Will hold diuretic. We'll provide IV fluids. Will recheck in the a.m.    Tachycardia: Likely related to dehydration secondary to decreased by mouth intake and diuretics. Heart rate already improved during my exam in the emergency department. Will provide IV fluids. Will monitor on telemetry    Dehydration: We'll provide IV fluids gently. Will hold diuretics. Will check be met in the a.m. Check orthostatics on admission and again in the morning.    Fall: Likely related to #1 and orthostatic hypotension. Patient does use a walker since her recent hospitalization and was about to start home health PT. Will request PT eval and anticipate resuming home  health PT upon discharge.    DIABETES MELLITUS, TYPE II, UNCONTROLLED: Will check hemoglobin A1c. Will hold oral agents for now as well as home Lantus. Provide car modified diet and sliding scale insulin for optimal glucose control    HYPERLIPIDEMIA: We'll continue statin.    ANXIETY: Stable at baseline.    HYPERTENSION: See #1.    CORONARY ATHEROSCLEROSIS NATIVE CORONARY ARTERY: No chest pain. Initial troponin negative will check one more. Monitor on telemetry. Continue aspirin statin.       Code Status: Full Family  Communication: Daughter at bedside Disposition Plan: Home when ready hopefully tomorrow  Time spent: 22 minute  [facility] Pager [phone]  If 7PM-7AM, please contact night-coverage [email or web address] Password [facility] 05/30/2013, 4:27 PM

## 2013-05-30 NOTE — H&P (Addendum)
Patient seen, independently examined and chart reviewed. I agree with exam, assessment and plan discussed with Dyanne Carrel, NP.  Very pleasant 79 year old who in general has felt somewhat poorly since appendectomy 03/2013. She had a poor appetite before surgery, but since appendectomy her appetite has been worse and her oral intake has been limited. Nothing tastes good and despite encouragement from her daughter she does not eat well. Today she is apparently to see her primary care physician standing in front of the mirror at home when she suddenly fell to the floor, landing on her bottom. She denied herself or lose consciousness. She is not sure why she fell but she might of had some dizziness sore visual changes before she fell. In her primary care physician's office she was noted to be hypotensive and tachycardic and referred to the emergency department where she was found to be orthostatic, dehydrated and referred for admission.  Currently she feels okay and denies pain or other complaints. She appears calm and comfortable and nontoxic. Her exam is generally unremarkable with the exception of tachycardia. Her lungs are clear to auscultation bilaterally and she has normal respiratory effort. Abdomen soft. Her mood and affect are grossly normal.  Chest x-ray was negative, EKG showed a narrow complex tachycardia, T wave inversion anterior leads, compared to previous study 5/29, T wave inversion is old. Consider junctional tachycardia.  She does take indapamide.  Her constellation of signs and symptoms is most suggestive of orthostatic hypotension secondary to dehydration, chronic poor oral intake and diuretic therapy. Hold diuretics, treated with IV fluids and reevaluate in the morning.   Murray Hodgkins, MD [facility] [phone]

## 2013-05-30 NOTE — ED Notes (Addendum)
Family reports pt had appendix out approx 1 month ago.  Went to PCP this morning and was told pt's bp low, HR elevated.  Pt c/o feeling dizzy early this morning, legs gave out, and she fell.  Pt c/o feeling tired and weak.  Denies any pain.  Pt says has been feeling weak since discharged from hospital after surgery.

## 2013-05-31 DIAGNOSIS — I959 Hypotension, unspecified: Secondary | ICD-10-CM | POA: Diagnosis not present

## 2013-05-31 DIAGNOSIS — E860 Dehydration: Secondary | ICD-10-CM | POA: Diagnosis not present

## 2013-05-31 LAB — CBC
HCT: 34 % — ABNORMAL LOW (ref 36.0–46.0)
Hemoglobin: 11.7 g/dL — ABNORMAL LOW (ref 12.0–15.0)
MCH: 32.4 pg (ref 26.0–34.0)
MCHC: 34.4 g/dL (ref 30.0–36.0)
MCV: 94.2 fL (ref 78.0–100.0)
Platelets: 289 10*3/uL (ref 150–400)
RBC: 3.61 MIL/uL — ABNORMAL LOW (ref 3.87–5.11)
RDW: 13.7 % (ref 11.5–15.5)
WBC: 4.4 10*3/uL (ref 4.0–10.5)

## 2013-05-31 LAB — GLUCOSE, CAPILLARY
Glucose-Capillary: 155 mg/dL — ABNORMAL HIGH (ref 70–99)
Glucose-Capillary: 143 mg/dL — ABNORMAL HIGH (ref 70–99)
Glucose-Capillary: 159 mg/dL — ABNORMAL HIGH (ref 70–99)
Glucose-Capillary: 209 mg/dL — ABNORMAL HIGH (ref 70–99)

## 2013-05-31 LAB — COMPREHENSIVE METABOLIC PANEL
ALT: 16 U/L (ref 0–35)
AST: 22 U/L (ref 0–37)
Albumin: 3.2 g/dL — ABNORMAL LOW (ref 3.5–5.2)
Alkaline Phosphatase: 113 U/L (ref 39–117)
BUN: 7 mg/dL (ref 6–23)
CO2: 27 mEq/L (ref 19–32)
Calcium: 8.9 mg/dL (ref 8.4–10.5)
Chloride: 101 mEq/L (ref 96–112)
Creatinine, Ser: 0.4 mg/dL — ABNORMAL LOW (ref 0.50–1.10)
GFR calc Af Amer: >90 mL/min (ref >90)
GFR calc non Af Amer: >90 mL/min (ref >90)
Glucose, Bld: 153 mg/dL — ABNORMAL HIGH (ref 70–99)
Potassium: 3.1 mEq/L — ABNORMAL LOW (ref 3.5–5.1)
Sodium: 136 mEq/L (ref 135–145)
Total Bilirubin: 0.7 mg/dL (ref 0.3–1.2)
Total Protein: 6.6 g/dL (ref 6.0–8.3)

## 2013-05-31 LAB — HEMOGLOBIN A1C
Hgb A1c MFr Bld: 6.5 % — ABNORMAL HIGH (ref <5.7)
Mean Plasma Glucose: 140 mg/dL — ABNORMAL HIGH (ref <117)

## 2013-05-31 MED ORDER — POTASSIUM CHLORIDE 10 MEQ/100ML IV SOLN
10.0000 meq | INTRAVENOUS | Status: AC
  Administered 2013-05-31 (×3): 10 meq via INTRAVENOUS
  Filled 2013-05-31 (×3): qty 100

## 2013-05-31 MED ORDER — GLUCERNA SHAKE PO LIQD
237.0000 mL | Freq: Two times a day (BID) | ORAL | Status: DC
  Administered 2013-06-01: 237 mL via ORAL

## 2013-05-31 MED ORDER — POTASSIUM CHLORIDE CRYS ER 10 MEQ PO TBCR
20.0000 meq | EXTENDED_RELEASE_TABLET | Freq: Once | ORAL | Status: AC
  Administered 2013-05-31: 20 meq via ORAL
  Filled 2013-05-31: qty 2

## 2013-05-31 NOTE — Evaluation (Signed)
Physical Therapy Evaluation Patient Details Name: [name] MRN: [id number] DOB: August 25, 1931 Today's Date: 05/31/2013 Time: 1050-1110 PT Time Calculation (min): 20 min  PT Assessment / Plan / Recommendation History of Present Illness   Pt recently discharged from hospital who was admitted from the ER with dx of syncope.  Pt states she has not been eating or drinking well since discharged.  Pt states she understands the importance of eating and drinking.  Clinical Impression  Pt is at prior level of function does not need any skilled therapy    PT Assessment  Patent does not need any further PT services    Follow Up Recommendations  No PT follow up    Does the patient have the potential to tolerate intense rehabilitation    N/A  Barriers to Discharge  none      Equipment Recommendations    none   Recommendations for Other Services   none  Frequency      Precautions / Restrictions Precautions Precautions: None Restrictions Weight Bearing Restrictions: No         Mobility  Bed Mobility Bed Mobility: Supine to Sit Supine to Sit: 7: Independent Transfers Transfers: Sit to Stand Sit to Stand: 7: Independent Ambulation/Gait Ambulation/Gait Assistance: 7: Independent Ambulation Distance (Feet): 200 Feet Assistive device: None Gait Pattern: Within Functional Limits Gait velocity: normal Stairs: No    Exercises  none   PT Diagnosis:   none PT Problem List:  none PT Treatment Interventions:   none    PT Goals(Current goals can be found in the care plan section) no goals   Visit Information  Last PT Received On: 05/31/13       Prior Functioning  Home Living Family/patient expects to be discharged to:: Private residence Living Arrangements: Children Available Help at Discharge: Family;Available 24 hours/day Type of Home: House Home Access: Stairs to enter [employer] of Steps: 5 Entrance Stairs-Rails: Right;Left Home Equipment: Walker - 2  wheels;Cane - single point;Bedside commode;Shower seat;Grab bars - toilet Additional Comments: pt uses no AD for gait PTA Prior Function Level of Independence: Independent Communication Communication: No difficulties    Cognition  Cognition Arousal/Alertness: Awake/alert Behavior During Therapy: WFL for tasks assessed/performed Overall Cognitive Status: Within Functional Limits for tasks assessed    Extremity/Trunk Assessment Lower Extremity Assessment Lower Extremity Assessment: Overall WFL for tasks assessed   Balance    End of Session PT - End of Session Equipment Utilized During Treatment: Gait belt Activity Tolerance: Patient tolerated treatment well  GP     RUSSELL,CINDY 05/31/2013, 11:11 AM

## 2013-05-31 NOTE — Progress Notes (Signed)
INITIAL NUTRITION ASSESSMENT  DOCUMENTATION CODES Per approved criteria  -Not Applicable   INTERVENTION:  Glucerna Shake po BID, each supplement provides 220 kcal and 10 grams of protein.  Provided hydration education   Suggest consider depression screen  NUTRITION DIAGNOSIS: Inadequate oral intake related to decreased appetite since surgery as evidenced by pt report and wt loss of 10#, 7%.   Goal: Pt to meet >/= 90% of their estimated nutrition needs   Monitor:  Po intake, labs and wt trends  Reason for Assessment: Malnutrition Screen Score = 2  79 y.o. female  Admitting Dx: Hypotension  ASSESSMENT: Pt s/p fall. She lives with her daughter. Reports that usually she has hot cereal for breakfast, sandwich for lunch and daughter cooks dinner. Drinks water and diet Mt Dew. Today po's ~50% of meals. Discussed with her the importance of adequate nutrition/hydration. She has experienced severe wt loss 10#,7% <30d which is clinically significant. Suspect pt is malnourished but unable to diagnose at this time.  Height: Ht Readings from Last 1 Encounters:  05/30/13 5' 4" (1.626 m)    Weight: Wt Readings from Last 1 Encounters:  05/30/13 138 lb 0.1 oz (62.6 kg)    Ideal Body Weight: 120# (54.5 kg)  % Ideal Body Weight: 115%  Wt Readings from Last 10 Encounters:  05/30/13 138 lb 0.1 oz (62.6 kg)  05/08/13 148 lb (67.132 kg)  04/20/13 156 lb 8.4 oz (71 kg)  04/13/13 148 lb (67.132 kg)  04/13/13 148 lb (67.132 kg)  02/16/13 148 lb (67.132 kg)  11/27/11 149 lb 3.2 oz (67.677 kg)  10/21/11 144 lb (65.318 kg)  10/30/10 145 lb (65.772 kg)  04/15/10 141 lb (63.957 kg)    Usual Body Weight: 148#  % Usual Body Weight: 93%  BMI:  Body mass index is 23.68 kg/(m^2).normal needs   Estimated Nutritional Needs: Kcal: 1575-1890  Protein: 75-85 gr Fluid: 1900 ml/day  Skin: WNL  Diet Order: Carb Control  EDUCATION NEEDS: -Education needs addressed   Intake/Output  Summary (Last 24 hours) at 05/31/13 1549 Last data filed at 05/31/13 1315  Gross per 24 hour  Intake   1305 ml  Output   2100 ml  Net   -795 ml    Last BM: 05/30/13  Labs:   Recent Labs Lab 05/30/13 1306 05/31/13 0559  NA 127* 136  K 3.6 3.1*  CL 90* 101  CO2 26 27  BUN 13 7  CREATININE 0.53 0.40*  CALCIUM 9.3 8.9  GLUCOSE 187* 153*    CBG (last 3)   Recent Labs  05/30/13 2137 05/31/13 0715 05/31/13 1115  GLUCAP 116* 155* 143*    Scheduled Meds: . aspirin  325 mg Oral Daily  . atorvastatin  10 mg Oral Daily  . enoxaparin (LOVENOX) injection  40 mg Subcutaneous Q24H  . insulin aspart  0-15 Units Subcutaneous TID WC  . pantoprazole  40 mg Oral Daily  . potassium chloride  10 mEq Intravenous Q1 Hr x 3  . sodium chloride  3 mL Intravenous Q12H  . tiotropium  18 mcg Inhalation Daily    Continuous Infusions: . sodium chloride 75 mL/hr at 05/31/13 0542    Past Medical History  Diagnosis Date  . Hemorrhage of gastrointestinal tract, unspecified   . Other malaise and fatigue   . Tobacco use disorder   . Parotid tumor   . Neoplasm of malignant     breast  ,HX   . Diabetes mellitus   . Osteopenia   .  Hypertension   . Hyperlipidemia   . Depression   . Anxiety state, unspecified     Past Surgical History  Procedure Laterality Date  . Mastectomy  1994    left breast -related to breast cancer  . Vesicovaginal fistula closure w/ tah  1979  . Cholecystectomy  2008  . Hemorrhoid surgery  1960s  . Laparoscopic appendectomy N/A 04/13/2013    Procedure: APPENDECTOMY LAPAROSCOPIC;  Surgeon: Donato Heinz, MD;  Location: [facility];  Service: General;  Laterality: N/A;  . Appendectomy      Colman Cater MS,RD,LDN,CSG Office: [phone] Pager: [phone]

## 2013-05-31 NOTE — Progress Notes (Signed)
Subjective: Patient feels better. She is drinking and eating. She feels less dizzy. No chest pain, nausea or vomiting.  Objective: Vital signs in last 24 hours: Temp:  [98 F (36.7 C)-98.3 F (36.8 C)] 98 F (36.7 C) (07/16 1334) Pulse Rate:  [70-83] 70 (07/16 1334) Resp:  [20] 20 (07/16 1334) BP: (131-154)/(58-80) 131/72 mmHg (07/16 1334) SpO2:  [96 %-100 %] 100 % (07/16 1334) Weight:  [62.6 kg (138 lb 0.1 oz)] 62.6 kg (138 lb 0.1 oz) (07/15 1740) Weight change:  Last BM Date: 05/30/13  Intake/Output from previous day: 07/15 0701 - 07/16 0700 In: 1185 [P.O.:480; I.V.:705] Out: 1700 [Urine:1700]  PHYSICAL EXAM General appearance: alert and no distress Resp: clear to auscultation bilaterally Cardio: regular rate and rhythm, S1, S2 normal, no murmur, click, rub or gallop GI: soft, non-tender; bowel sounds normal; no masses,  no organomegaly Extremities: extremities normal, atraumatic, no cyanosis or edema  Lab Results:    _0 @ [facility] No results found for this basename: PHART, PCO2, PO2ART, TCO2, HCO3,  in the last 72 hours CULTURES No results found for this or any previous visit (from the past 240 hour(s)). Studies/Results: Dg Chest Portable 1 View  05/30/2013   *RADIOLOGY REPORT*  Clinical Data: Short of breath  PORTABLE CHEST - 1 VIEW  Comparison: Prior chest x-ray 04/19/2013; acute abdominal series including frontal chest x-ray 05/08/2013  Findings: Continued improvement and patchy right lower lobe airspace disease over the past to chest x-rays.  There may be a small residual right pleural effusion.  Borderline enlarged heart stable compared to prior.  Trace atherosclerotic calcification of the transverse aorta.  Prominence of the interstitial markings and pulmonary vascular congestion are unchanged.  No acute osseous abnormality.  No pneumothorax.  IMPRESSION:  1.  No acute cardiopulmonary disease. 2.  Continued slight interval improvement and right lower lobe opacity.   There may be a small right pleural effusion.   Original Report Authenticated By: Jacqulynn Cadet, M.D.    Medications: I have reviewed the patient's current medications.  Assesment:   Principal Problem:   Hypotension Active Problems:   DIABETES MELLITUS, TYPE II, UNCONTROLLED   HYPERLIPIDEMIA   ANXIETY   HYPERTENSION   CORONARY ATHEROSCLEROSIS NATIVE CORONARY ARTERY   Hyponatremia   Tachycardia   Dehydration   Fall hypokalemia   Plan: Medications reviewed Continue IV fluid Will supplement K+ CBC and BMP in AM   LOS: 1 day   Sharvi Mooneyhan 05/31/2013, 3:47 PM

## 2013-05-31 NOTE — Progress Notes (Signed)
Utilization Review Complete

## 2013-05-31 NOTE — Care Management Note (Signed)
    Page 1 of 1   06/01/2013     2:14:03 PM   CARE MANAGEMENT NOTE 06/01/2013  Patient:  [name], [name]   Account Number:  [id number]  Date Initiated:  05/31/2013  Documentation initiated by:  Claretha Cooper  Subjective/Objective Assessment:   Pt admitted from home where she lives with her family. Active with [facility]. DC home to resume HH.     Action/Plan:   Anticipated DC Date:  06/01/2013   Anticipated DC Plan:  [place]  CM consult      [facility] Choice  Resumption Of Svcs/PTA Provider   Choice offered to / List presented to:             [place].   Status of service:  Completed, signed off Medicare Important Message given?  NA - LOS <3 / Initial given by admissions (If response is "NO", the following Medicare IM given date fields will be blank) Date Medicare IM given:   Date Additional Medicare IM given:    Discharge Disposition:  [place]  Per UR Regulation:    If discussed at Long Length of Stay Meetings, dates discussed:    Comments:  05/31/13 Claretha Cooper RN BSN CM Around noon, Dr. Legrand Rams called. Unaware his pt was admitted. Informed and changed to inpt status

## 2013-06-01 DIAGNOSIS — I959 Hypotension, unspecified: Secondary | ICD-10-CM | POA: Diagnosis not present

## 2013-06-01 DIAGNOSIS — E860 Dehydration: Secondary | ICD-10-CM | POA: Diagnosis not present

## 2013-06-01 LAB — BASIC METABOLIC PANEL
BUN: 7 mg/dL (ref 6–23)
CO2: 29 mEq/L (ref 19–32)
Calcium: 9.1 mg/dL (ref 8.4–10.5)
Chloride: 100 mEq/L (ref 96–112)
Creatinine, Ser: 0.47 mg/dL — ABNORMAL LOW (ref 0.50–1.10)
GFR calc Af Amer: >90 mL/min (ref >90)
GFR calc non Af Amer: >90 mL/min (ref >90)
Glucose, Bld: 152 mg/dL — ABNORMAL HIGH (ref 70–99)
Potassium: 3.3 mEq/L — ABNORMAL LOW (ref 3.5–5.1)
Sodium: 136 mEq/L (ref 135–145)

## 2013-06-01 LAB — CBC
HCT: 34.1 % — ABNORMAL LOW (ref 36.0–46.0)
Hemoglobin: 12 g/dL (ref 12.0–15.0)
MCH: 33.3 pg (ref 26.0–34.0)
MCHC: 35.2 g/dL (ref 30.0–36.0)
MCV: 94.7 fL (ref 78.0–100.0)
Platelets: 331 10*3/uL (ref 150–400)
RBC: 3.6 MIL/uL — ABNORMAL LOW (ref 3.87–5.11)
RDW: 13.7 % (ref 11.5–15.5)
WBC: 4.9 10*3/uL (ref 4.0–10.5)

## 2013-06-01 LAB — GLUCOSE, CAPILLARY: Glucose-Capillary: 135 mg/dL — ABNORMAL HIGH (ref 70–99)

## 2013-06-01 MED ORDER — POTASSIUM CHLORIDE 10 MEQ/100ML IV SOLN: 10.0000 meq | INTRAVENOUS | Status: AC

## 2013-06-01 NOTE — Plan of Care (Signed)
Problem: Discharge Progression Outcomes Goal: Discharge plan in place and appropriate Went over DC orders and plan

## 2013-06-01 NOTE — Discharge Summary (Signed)
Pt stated that she was ready to go home and pain was under control.  Pt's IV and tele were removed prior to DC and her DC papers and education were discussed with her daughter in the room.  Home health will also be resuming at discharged and pt was informed about FU with PCP.  Pt will be wheeled to car by PCT and family.

## 2013-06-01 NOTE — Discharge Summary (Signed)
Physician Discharge Summary  Patient ID: [name] MRN: [id number] DOB/AGE: [birth year] 77 y.o. Primary Care Physician:Alexzia Kasler, MD Admit date: 05/30/2013 Discharge date: 06/01/2013    Discharge Diagnoses:   Principal Problem:   Hypotension Active Problems:   DIABETES MELLITUS, TYPE II, UNCONTROLLED   HYPERLIPIDEMIA   ANXIETY   HYPERTENSION   CORONARY ATHEROSCLEROSIS NATIVE CORONARY ARTERY   Hyponatremia   Tachycardia   Dehydration   Fall Hypokalemia    Medication List    STOP taking these medications       amLODipine-benazepril 5-20 MG per capsule  Commonly known as:  LOTREL     HYDROcodone-acetaminophen 5-325 MG per tablet  Commonly known as:  NORCO     indapamide 1.25 MG tablet  Commonly known as:  LOZOL      TAKE these medications       acetaminophen 500 MG tablet  Commonly known as:  TYLENOL  Take 500 mg by mouth every 6 (six) hours as needed for pain.     ALPRAZolam 0.25 MG dissolvable tablet  Commonly known as:  NIRAVAM  Take 0.25 mg by mouth at bedtime as needed.     aspirin 325 MG tablet  Take 325 mg by mouth daily.     atorvastatin 10 MG tablet  Commonly known as:  LIPITOR  Take 10 mg by mouth daily.     CALCIUM CARBONATE PO  Take 1,000 mg by mouth daily.     cholecalciferol 1000 UNITS tablet  Commonly known as:  VITAMIN D  Take 1,000 Units by mouth daily.     docusate sodium 100 MG capsule  Commonly known as:  COLACE  Take 300 mg by mouth daily as needed for constipation.     esomeprazole 40 MG capsule  Commonly known as:  NEXIUM  Take 40 mg by mouth daily before breakfast.     glyBURIDE-metformin 5-500 MG per tablet  Commonly known as:  GLUCOVANCE  Take 1 tablet by mouth 2 (two) times daily with a meal.     insulin glargine 100 UNIT/ML injection  Commonly known as:  LANTUS  Inject 2-8 Units into the skin at bedtime. Give if BS 140-150= 3 units, 151-160=4 units, 161-170=5 units, 171-180= 6 units, >181=7units      tiotropium 18 MCG inhalation capsule  Commonly known as:  SPIRIVA  Place 18 mcg into inhaler and inhale daily.        Discharged Condition: improved    Consults: None  Significant Diagnostic Studies: Dg Chest Portable 1 View  05/30/2013   *RADIOLOGY REPORT*  Clinical Data: Short of breath  PORTABLE CHEST - 1 VIEW  Comparison: Prior chest x-ray 04/19/2013; acute abdominal series including frontal chest x-ray 05/08/2013  Findings: Continued improvement and patchy right lower lobe airspace disease over the past to chest x-rays.  There may be a small residual right pleural effusion.  Borderline enlarged heart stable compared to prior.  Trace atherosclerotic calcification of the transverse aorta.  Prominence of the interstitial markings and pulmonary vascular congestion are unchanged.  No acute osseous abnormality.  No pneumothorax.  IMPRESSION:  1.  No acute cardiopulmonary disease. 2.  Continued slight interval improvement and right lower lobe opacity.  There may be a small right pleural effusion.   Original Report Authenticated By: Jacqulynn Cadet, M.D.   Dg Abd Acute W/chest  05/08/2013   *RADIOLOGY REPORT*  Clinical Data: Abdominal pain.  Nausea, vomiting.  ACUTE ABDOMEN SERIES (ABDOMEN 2 VIEW & CHEST 1 VIEW)  Comparison: 04/19/2013  Findings: There is hyperinflation of the lungs compatible with COPD.  Right basilar airspace disease again noted.  Cannot exclude pneumonia.  Left lung is clear.  No effusions currently.  Heart is normal size.  IMPRESSION: COPD.  Right basilar atelectasis or infiltrate.   Original Report Authenticated By: Rolm Baptise, M.D.    Lab Results: Basic Metabolic Panel:  Recent Labs  05/31/13 0559 06/01/13 0559  NA 136 136  K 3.1* 3.3*  CL 101 100  CO2 27 29  GLUCOSE 153* 152*  BUN 7 7  CREATININE 0.40* 0.47*  CALCIUM 8.9 9.1   Liver Function Tests:  Recent Labs  05/30/13 1306 05/31/13 0559  AST 27 22  ALT 19 16  ALKPHOS 128* 113  BILITOT 0.6 0.7   PROT 7.2 6.6  ALBUMIN 3.5 3.2*     CBC:  Recent Labs  05/30/13 1306 05/31/13 0559 06/01/13 0559  WBC 5.9 4.4 4.9  NEUTROABS 3.7  --   --   HGB 12.4 11.7* 12.0  HCT 35.5* 34.0* 34.1*  MCV 93.9 94.2 94.7  PLT 331 289 331    No results found for this or any previous visit (from the past 240 hour(s)).   Hospital Course:   This is an 79 years old female patient with history of multiple medical illnesses who was admitted due to generalized weakness. She was found to be hypotensive, tachycardic and hyponatremic. Patient was rehydrated and her K+was replaced. Patient improved and discharged home to be followed in the office. Her antihypertensive medications are discontinued until she is re-evaluaed in the office.   Discharge Exam: Blood pressure 153/77, pulse 69, temperature 98.4 F (36.9 C), temperature source Oral, resp. rate 20, height _0  (1.626 m), weight 62.6 kg (138 lb 0.1 oz), SpO2 97.00%.    Disposition:  home        Follow-up Information   Follow up with [facility], MD In 1 week.   Contact information:   [address] [place] [address] [phone]       Signed: Ziair Penson  06/01/2013, 7:59 AM

## 2013-06-02 DIAGNOSIS — Z48815 Encounter for surgical aftercare following surgery on the digestive system: Secondary | ICD-10-CM | POA: Diagnosis not present

## 2013-06-02 DIAGNOSIS — K929 Disease of digestive system, unspecified: Secondary | ICD-10-CM | POA: Diagnosis not present

## 2013-06-02 DIAGNOSIS — F3289 Other specified depressive episodes: Secondary | ICD-10-CM | POA: Diagnosis not present

## 2013-06-02 DIAGNOSIS — E119 Type 2 diabetes mellitus without complications: Secondary | ICD-10-CM | POA: Diagnosis not present

## 2013-06-02 DIAGNOSIS — F329 Major depressive disorder, single episode, unspecified: Secondary | ICD-10-CM | POA: Diagnosis not present

## 2013-06-02 DIAGNOSIS — F411 Generalized anxiety disorder: Secondary | ICD-10-CM | POA: Diagnosis not present

## 2013-06-02 DIAGNOSIS — I10 Essential (primary) hypertension: Secondary | ICD-10-CM | POA: Diagnosis not present

## 2013-06-05 DIAGNOSIS — E119 Type 2 diabetes mellitus without complications: Secondary | ICD-10-CM | POA: Diagnosis not present

## 2013-06-05 DIAGNOSIS — F411 Generalized anxiety disorder: Secondary | ICD-10-CM | POA: Diagnosis not present

## 2013-06-05 DIAGNOSIS — I10 Essential (primary) hypertension: Secondary | ICD-10-CM | POA: Diagnosis not present

## 2013-06-05 DIAGNOSIS — K929 Disease of digestive system, unspecified: Secondary | ICD-10-CM | POA: Diagnosis not present

## 2013-06-05 DIAGNOSIS — F329 Major depressive disorder, single episode, unspecified: Secondary | ICD-10-CM | POA: Diagnosis not present

## 2013-06-05 DIAGNOSIS — F3289 Other specified depressive episodes: Secondary | ICD-10-CM | POA: Diagnosis not present

## 2013-06-05 DIAGNOSIS — Z48815 Encounter for surgical aftercare following surgery on the digestive system: Secondary | ICD-10-CM | POA: Diagnosis not present

## 2013-06-07 DIAGNOSIS — F3289 Other specified depressive episodes: Secondary | ICD-10-CM | POA: Diagnosis not present

## 2013-06-07 DIAGNOSIS — K929 Disease of digestive system, unspecified: Secondary | ICD-10-CM | POA: Diagnosis not present

## 2013-06-07 DIAGNOSIS — F329 Major depressive disorder, single episode, unspecified: Secondary | ICD-10-CM | POA: Diagnosis not present

## 2013-06-07 DIAGNOSIS — I10 Essential (primary) hypertension: Secondary | ICD-10-CM | POA: Diagnosis not present

## 2013-06-07 DIAGNOSIS — Z48815 Encounter for surgical aftercare following surgery on the digestive system: Secondary | ICD-10-CM | POA: Diagnosis not present

## 2013-06-07 DIAGNOSIS — F411 Generalized anxiety disorder: Secondary | ICD-10-CM | POA: Diagnosis not present

## 2013-06-07 DIAGNOSIS — E119 Type 2 diabetes mellitus without complications: Secondary | ICD-10-CM | POA: Diagnosis not present

## 2013-06-09 DIAGNOSIS — I10 Essential (primary) hypertension: Secondary | ICD-10-CM | POA: Diagnosis not present

## 2013-06-09 DIAGNOSIS — E119 Type 2 diabetes mellitus without complications: Secondary | ICD-10-CM | POA: Diagnosis not present

## 2013-06-12 ENCOUNTER — Emergency Department (HOSPITAL_COMMUNITY): Payer: Medicare Other

## 2013-06-12 ENCOUNTER — Observation Stay (HOSPITAL_COMMUNITY): Payer: Medicare Other

## 2013-06-12 ENCOUNTER — Inpatient Hospital Stay (HOSPITAL_COMMUNITY)
Admission: EM | Admit: 2013-06-12 | Discharge: 2013-06-15 | DRG: 310 | Disposition: A | Discharge status: Home Health | Payer: Medicare Other | Attending: Internal Medicine | Admitting: Internal Medicine

## 2013-06-12 ENCOUNTER — Encounter (HOSPITAL_COMMUNITY): Payer: Self-pay

## 2013-06-12 DIAGNOSIS — G609 Hereditary and idiopathic neuropathy, unspecified: Secondary | ICD-10-CM | POA: Diagnosis not present

## 2013-06-12 DIAGNOSIS — Z9010 Acquired absence of unspecified breast and nipple: Secondary | ICD-10-CM | POA: Diagnosis not present

## 2013-06-12 DIAGNOSIS — Z794 Long term (current) use of insulin: Secondary | ICD-10-CM | POA: Diagnosis not present

## 2013-06-12 DIAGNOSIS — F3289 Other specified depressive episodes: Secondary | ICD-10-CM | POA: Diagnosis present

## 2013-06-12 DIAGNOSIS — E860 Dehydration: Secondary | ICD-10-CM | POA: Diagnosis present

## 2013-06-12 DIAGNOSIS — R29818 Other symptoms and signs involving the nervous system: Secondary | ICD-10-CM | POA: Diagnosis not present

## 2013-06-12 DIAGNOSIS — I951 Orthostatic hypotension: Secondary | ICD-10-CM | POA: Diagnosis not present

## 2013-06-12 DIAGNOSIS — R404 Transient alteration of awareness: Secondary | ICD-10-CM | POA: Diagnosis not present

## 2013-06-12 DIAGNOSIS — Z79899 Other long term (current) drug therapy: Secondary | ICD-10-CM | POA: Diagnosis not present

## 2013-06-12 DIAGNOSIS — R112 Nausea with vomiting, unspecified: Secondary | ICD-10-CM | POA: Diagnosis present

## 2013-06-12 DIAGNOSIS — Z7982 Long term (current) use of aspirin: Secondary | ICD-10-CM | POA: Diagnosis not present

## 2013-06-12 DIAGNOSIS — I6529 Occlusion and stenosis of unspecified carotid artery: Secondary | ICD-10-CM | POA: Diagnosis present

## 2013-06-12 DIAGNOSIS — T1490XA Injury, unspecified, initial encounter: Secondary | ICD-10-CM | POA: Diagnosis not present

## 2013-06-12 DIAGNOSIS — R5381 Other malaise: Secondary | ICD-10-CM | POA: Diagnosis not present

## 2013-06-12 DIAGNOSIS — R7401 Elevation of levels of liver transaminase levels: Secondary | ICD-10-CM | POA: Diagnosis present

## 2013-06-12 DIAGNOSIS — F411 Generalized anxiety disorder: Secondary | ICD-10-CM | POA: Diagnosis present

## 2013-06-12 DIAGNOSIS — R000 Tachycardia, unspecified: Secondary | ICD-10-CM

## 2013-06-12 DIAGNOSIS — R51 Headache: Secondary | ICD-10-CM | POA: Diagnosis present

## 2013-06-12 DIAGNOSIS — R29898 Other symptoms and signs involving the musculoskeletal system: Secondary | ICD-10-CM | POA: Diagnosis not present

## 2013-06-12 DIAGNOSIS — E876 Hypokalemia: Secondary | ICD-10-CM | POA: Diagnosis present

## 2013-06-12 DIAGNOSIS — I10 Essential (primary) hypertension: Secondary | ICD-10-CM | POA: Diagnosis not present

## 2013-06-12 DIAGNOSIS — S0990XA Unspecified injury of head, initial encounter: Secondary | ICD-10-CM | POA: Diagnosis not present

## 2013-06-12 DIAGNOSIS — S199XXA Unspecified injury of neck, initial encounter: Secondary | ICD-10-CM | POA: Diagnosis not present

## 2013-06-12 DIAGNOSIS — F329 Major depressive disorder, single episode, unspecified: Secondary | ICD-10-CM | POA: Diagnosis present

## 2013-06-12 DIAGNOSIS — I2510 Atherosclerotic heart disease of native coronary artery without angina pectoris: Secondary | ICD-10-CM | POA: Diagnosis not present

## 2013-06-12 DIAGNOSIS — E119 Type 2 diabetes mellitus without complications: Secondary | ICD-10-CM | POA: Diagnosis not present

## 2013-06-12 DIAGNOSIS — Z87891 Personal history of nicotine dependence: Secondary | ICD-10-CM

## 2013-06-12 DIAGNOSIS — Z853 Personal history of malignant neoplasm of breast: Secondary | ICD-10-CM | POA: Diagnosis not present

## 2013-06-12 DIAGNOSIS — R7402 Elevation of levels of lactic acid dehydrogenase (LDH): Secondary | ICD-10-CM | POA: Diagnosis not present

## 2013-06-12 DIAGNOSIS — I25119 Atherosclerotic heart disease of native coronary artery with unspecified angina pectoris: Secondary | ICD-10-CM | POA: Diagnosis present

## 2013-06-12 DIAGNOSIS — Z9089 Acquired absence of other organs: Secondary | ICD-10-CM | POA: Diagnosis not present

## 2013-06-12 DIAGNOSIS — M899 Disorder of bone, unspecified: Secondary | ICD-10-CM | POA: Diagnosis present

## 2013-06-12 DIAGNOSIS — R269 Unspecified abnormalities of gait and mobility: Secondary | ICD-10-CM | POA: Diagnosis present

## 2013-06-12 DIAGNOSIS — I471 Supraventricular tachycardia, unspecified: Principal | ICD-10-CM | POA: Diagnosis present

## 2013-06-12 DIAGNOSIS — S0993XA Unspecified injury of face, initial encounter: Secondary | ICD-10-CM | POA: Diagnosis not present

## 2013-06-12 DIAGNOSIS — R5383 Other fatigue: Secondary | ICD-10-CM | POA: Diagnosis not present

## 2013-06-12 DIAGNOSIS — K743 Primary biliary cirrhosis: Secondary | ICD-10-CM | POA: Diagnosis present

## 2013-06-12 DIAGNOSIS — R627 Adult failure to thrive: Secondary | ICD-10-CM | POA: Diagnosis present

## 2013-06-12 DIAGNOSIS — M949 Disorder of cartilage, unspecified: Secondary | ICD-10-CM | POA: Diagnosis present

## 2013-06-12 DIAGNOSIS — Z8739 Personal history of other diseases of the musculoskeletal system and connective tissue: Secondary | ICD-10-CM | POA: Diagnosis not present

## 2013-06-12 DIAGNOSIS — E785 Hyperlipidemia, unspecified: Secondary | ICD-10-CM | POA: Diagnosis not present

## 2013-06-12 DIAGNOSIS — S064XAA Epidural hemorrhage with loss of consciousness status unknown, initial encounter: Secondary | ICD-10-CM | POA: Diagnosis not present

## 2013-06-12 DIAGNOSIS — I658 Occlusion and stenosis of other precerebral arteries: Secondary | ICD-10-CM | POA: Diagnosis not present

## 2013-06-12 DIAGNOSIS — Z8719 Personal history of other diseases of the digestive system: Secondary | ICD-10-CM | POA: Diagnosis not present

## 2013-06-12 DIAGNOSIS — R638 Other symptoms and signs concerning food and fluid intake: Secondary | ICD-10-CM | POA: Diagnosis not present

## 2013-06-12 DIAGNOSIS — IMO0001 Reserved for inherently not codable concepts without codable children: Secondary | ICD-10-CM | POA: Diagnosis present

## 2013-06-12 DIAGNOSIS — G459 Transient cerebral ischemic attack, unspecified: Secondary | ICD-10-CM | POA: Diagnosis not present

## 2013-06-12 DIAGNOSIS — R7989 Other specified abnormal findings of blood chemistry: Secondary | ICD-10-CM | POA: Diagnosis not present

## 2013-06-12 DIAGNOSIS — S064X9A Epidural hemorrhage with loss of consciousness of unspecified duration, initial encounter: Secondary | ICD-10-CM | POA: Diagnosis not present

## 2013-06-12 DIAGNOSIS — G9389 Other specified disorders of brain: Secondary | ICD-10-CM | POA: Diagnosis not present

## 2013-06-12 DIAGNOSIS — I498 Other specified cardiac arrhythmias: Secondary | ICD-10-CM | POA: Diagnosis not present

## 2013-06-12 DIAGNOSIS — S298XXA Other specified injuries of thorax, initial encounter: Secondary | ICD-10-CM | POA: Diagnosis not present

## 2013-06-12 DIAGNOSIS — J449 Chronic obstructive pulmonary disease, unspecified: Secondary | ICD-10-CM | POA: Diagnosis not present

## 2013-06-12 LAB — URINALYSIS, ROUTINE W REFLEX MICROSCOPIC
Bilirubin Urine: NEGATIVE
Glucose, UA: NEGATIVE mg/dL
Nitrite: NEGATIVE
Protein, ur: NEGATIVE mg/dL
Specific Gravity, Urine: 1.01 (ref 1.005–1.030)
Urobilinogen, UA: 0.2 mg/dL (ref 0.0–1.0)
pH: 7 (ref 5.0–8.0)

## 2013-06-12 LAB — COMPREHENSIVE METABOLIC PANEL
ALT: 47 U/L — ABNORMAL HIGH (ref 0–35)
AST: 48 U/L — ABNORMAL HIGH (ref 0–37)
Albumin: 3.4 g/dL — ABNORMAL LOW (ref 3.5–5.2)
Alkaline Phosphatase: 298 U/L — ABNORMAL HIGH (ref 39–117)
BUN: 11 mg/dL (ref 6–23)
CO2: 26 mEq/L (ref 19–32)
Calcium: 9.2 mg/dL (ref 8.4–10.5)
Chloride: 104 mEq/L (ref 96–112)
Creatinine, Ser: 0.44 mg/dL — ABNORMAL LOW (ref 0.50–1.10)
GFR calc Af Amer: >90 mL/min (ref >90)
GFR calc non Af Amer: >90 mL/min (ref >90)
Glucose, Bld: 188 mg/dL — ABNORMAL HIGH (ref 70–99)
Potassium: 3.5 mEq/L (ref 3.5–5.1)
Sodium: 139 mEq/L (ref 135–145)
Total Bilirubin: 0.6 mg/dL (ref 0.3–1.2)
Total Protein: 7.3 g/dL (ref 6.0–8.3)

## 2013-06-12 LAB — D-DIMER, QUANTITATIVE: D-Dimer, Quant: 0.29 ug/mL-FEU (ref 0.00–0.48)

## 2013-06-12 LAB — CBC
HCT: 35.1 % — ABNORMAL LOW (ref 36.0–46.0)
Hemoglobin: 11.9 g/dL — ABNORMAL LOW (ref 12.0–15.0)
MCH: 32.3 pg (ref 26.0–34.0)
MCHC: 33.9 g/dL (ref 30.0–36.0)
MCV: 95.4 fL (ref 78.0–100.0)
Platelets: 350 10*3/uL (ref 150–400)
RBC: 3.68 MIL/uL — ABNORMAL LOW (ref 3.87–5.11)
RDW: 14.2 % (ref 11.5–15.5)
WBC: 6.6 10*3/uL (ref 4.0–10.5)

## 2013-06-12 LAB — URINE MICROSCOPIC-ADD ON

## 2013-06-12 LAB — GLUCOSE, CAPILLARY
Glucose-Capillary: 179 mg/dL — ABNORMAL HIGH (ref 70–99)
Glucose-Capillary: 179 mg/dL — ABNORMAL HIGH (ref 70–99)
Glucose-Capillary: 125 mg/dL — ABNORMAL HIGH (ref 70–99)
Glucose-Capillary: 193 mg/dL — ABNORMAL HIGH (ref 70–99)

## 2013-06-12 LAB — TROPONIN I
Troponin I: <0.3 ng/mL (ref <0.30)
Troponin I: <0.3 ng/mL (ref <0.30)
Troponin I: <0.3 ng/mL (ref <0.30)

## 2013-06-12 LAB — MAGNESIUM: Magnesium: 1.4 mg/dL — ABNORMAL LOW (ref 1.5–2.5)

## 2013-06-12 LAB — HOMOCYSTEINE: Homocysteine: 8.1 umol/L (ref 4.0–15.4)

## 2013-06-12 LAB — TSH: TSH: 1.086 u[IU]/mL (ref 0.350–4.500)

## 2013-06-12 LAB — VITAMIN B12: Vitamin B-12: 230 pg/mL (ref 211–911)

## 2013-06-12 LAB — RPR: RPR Ser Ql: NONREACTIVE

## 2013-06-12 LAB — PRO B NATRIURETIC PEPTIDE: Pro B Natriuretic peptide (BNP): 880.3 pg/mL — ABNORMAL HIGH (ref 0–450)

## 2013-06-12 MED ORDER — DOCUSATE SODIUM 100 MG PO CAPS
300.0000 mg | ORAL_CAPSULE | Freq: Every day | ORAL | Status: DC | PRN
  Filled 2013-06-12: qty 3

## 2013-06-12 MED ORDER — HYDROCODONE-ACETAMINOPHEN 5-325 MG PO TABS
1.0000 | ORAL_TABLET | ORAL | Status: DC | PRN
  Administered 2013-06-12: 1 via ORAL
  Filled 2013-06-12: qty 1

## 2013-06-12 MED ORDER — ONDANSETRON HCL 4 MG/2ML IJ SOLN: 4.0000 mg | Freq: Four times a day (QID) | INTRAMUSCULAR | Status: DC | PRN

## 2013-06-12 MED ORDER — SODIUM CHLORIDE 0.9 % IV BOLUS (SEPSIS)
500.0000 mL | Freq: Once | INTRAVENOUS | Status: AC
  Administered 2013-06-12: 500 mL via INTRAVENOUS

## 2013-06-12 MED ORDER — BISACODYL 10 MG RE SUPP: 10.0000 mg | Freq: Every day | RECTAL | Status: DC | PRN

## 2013-06-12 MED ORDER — ACETAMINOPHEN 325 MG PO TABS
650.0000 mg | ORAL_TABLET | Freq: Four times a day (QID) | ORAL | Status: DC | PRN
  Administered 2013-06-13 – 2013-06-14 (×3): 650 mg via ORAL
  Filled 2013-06-12 (×4): qty 2

## 2013-06-12 MED ORDER — SODIUM CHLORIDE 0.9 % IJ SOLN
3.0000 mL | Freq: Two times a day (BID) | INTRAMUSCULAR | Status: DC
  Administered 2013-06-12 – 2013-06-14 (×6): 3 mL via INTRAVENOUS

## 2013-06-12 MED ORDER — ALPRAZOLAM 0.25 MG PO TABS
0.2500 mg | ORAL_TABLET | Freq: Every evening | ORAL | Status: DC | PRN
  Administered 2013-06-12 – 2013-06-14 (×3): 0.25 mg via ORAL
  Filled 2013-06-12 (×3): qty 1

## 2013-06-12 MED ORDER — POTASSIUM CHLORIDE CRYS ER 20 MEQ PO TBCR
40.0000 meq | EXTENDED_RELEASE_TABLET | ORAL | Status: AC
  Administered 2013-06-12 (×2): 40 meq via ORAL
  Filled 2013-06-12 (×2): qty 2

## 2013-06-12 MED ORDER — ATORVASTATIN CALCIUM 10 MG PO TABS
10.0000 mg | ORAL_TABLET | Freq: Every day | ORAL | Status: DC
  Administered 2013-06-12 – 2013-06-14 (×3): 10 mg via ORAL
  Filled 2013-06-12 (×3): qty 1

## 2013-06-12 MED ORDER — PANTOPRAZOLE SODIUM 40 MG PO TBEC
40.0000 mg | DELAYED_RELEASE_TABLET | Freq: Every day | ORAL | Status: DC
  Administered 2013-06-12 – 2013-06-14 (×3): 40 mg via ORAL
  Filled 2013-06-12 (×3): qty 1

## 2013-06-12 MED ORDER — INSULIN GLARGINE 100 UNIT/ML ~~LOC~~ SOLN
5.0000 [IU] | Freq: Every day | SUBCUTANEOUS | Status: DC
  Administered 2013-06-12 – 2013-06-14 (×3): 5 [IU] via SUBCUTANEOUS
  Filled 2013-06-12 (×4): qty 0.05

## 2013-06-12 MED ORDER — ACETAMINOPHEN 650 MG RE SUPP: 650.0000 mg | Freq: Four times a day (QID) | RECTAL | Status: DC | PRN

## 2013-06-12 MED ORDER — ONDANSETRON HCL 4 MG/2ML IJ SOLN: 4.0000 mg | Freq: Three times a day (TID) | INTRAMUSCULAR | Status: DC | PRN

## 2013-06-12 MED ORDER — ALPRAZOLAM 0.25 MG PO TBDP: 0.2500 mg | ORAL_TABLET | Freq: Every evening | ORAL | Status: DC | PRN

## 2013-06-12 MED ORDER — METOPROLOL SUCCINATE ER 25 MG PO TB24
25.0000 mg | ORAL_TABLET | Freq: Every day | ORAL | Status: DC
  Administered 2013-06-12 – 2013-06-14 (×3): 25 mg via ORAL
  Filled 2013-06-12 (×3): qty 1

## 2013-06-12 MED ORDER — ASPIRIN 325 MG PO TABS
325.0000 mg | ORAL_TABLET | Freq: Every day | ORAL | Status: DC
  Administered 2013-06-12 – 2013-06-14 (×3): 325 mg via ORAL
  Filled 2013-06-12 (×3): qty 1

## 2013-06-12 MED ORDER — INSULIN ASPART 100 UNIT/ML ~~LOC~~ SOLN
0.0000 [IU] | Freq: Three times a day (TID) | SUBCUTANEOUS | Status: DC
  Administered 2013-06-12 – 2013-06-14 (×5): 3 [IU] via SUBCUTANEOUS

## 2013-06-12 MED ORDER — ALUM & MAG HYDROXIDE-SIMETH 200-200-20 MG/5ML PO SUSP: 30.0000 mL | Freq: Four times a day (QID) | ORAL | Status: DC | PRN

## 2013-06-12 MED ORDER — GADOBENATE DIMEGLUMINE 529 MG/ML IV SOLN
12.0000 mL | Freq: Once | INTRAVENOUS | Status: AC | PRN
  Administered 2013-06-12: 12 mL via INTRAVENOUS

## 2013-06-12 MED ORDER — ONDANSETRON HCL 4 MG PO TABS
4.0000 mg | ORAL_TABLET | Freq: Four times a day (QID) | ORAL | Status: DC | PRN
  Administered 2013-06-13: 4 mg via ORAL
  Filled 2013-06-12: qty 1

## 2013-06-12 MED ORDER — TIOTROPIUM BROMIDE MONOHYDRATE 18 MCG IN CAPS
18.0000 ug | ORAL_CAPSULE | Freq: Every day | RESPIRATORY_TRACT | Status: DC
  Administered 2013-06-13 – 2013-06-15 (×3): 18 ug via RESPIRATORY_TRACT
  Filled 2013-06-12: qty 5

## 2013-06-12 MED ORDER — MAGNESIUM OXIDE 400 (241.3 MG) MG PO TABS
400.0000 mg | ORAL_TABLET | Freq: Once | ORAL | Status: AC
  Administered 2013-06-12: 400 mg via ORAL
  Filled 2013-06-12: qty 1

## 2013-06-12 MED ORDER — LORAZEPAM 2 MG/ML IJ SOLN
0.5000 mg | Freq: Once | INTRAMUSCULAR | Status: AC
  Administered 2013-06-12: 0.5 mg via INTRAVENOUS
  Filled 2013-06-12: qty 1

## 2013-06-12 MED ORDER — ENOXAPARIN SODIUM 40 MG/0.4ML ~~LOC~~ SOLN
40.0000 mg | SUBCUTANEOUS | Status: DC
  Administered 2013-06-12 – 2013-06-14 (×3): 40 mg via SUBCUTANEOUS
  Filled 2013-06-12 (×3): qty 0.4

## 2013-06-12 NOTE — Consult Note (Signed)
CARDIOLOGY CONSULT NOTE  Patient ID: [name] MRN: [id number] DOB/AGE: 05-26-1931 79 y.o.  Admit date: 06/12/2013 Referring Physician: [name] Primary Car[place], MD Primary Cardiologist: [name] Reason for Consultation: Abnormal EKG, tachycardia   HPI: 79 y/o female with history of DM, HTN, hyperlipidemia, PAD, tobacco abuse, cardiac cath on 03/31/10 with mild disease in the LAD but no obstructive lesions, COPD. Recent appendectomy complicated by ileus, with FTT since that time. Admitted after severe weakness in her legs while sitting on toilet. She states she woke that morning with severe headache,took a tyleno and used the bathroom. She unable to get up from sitting position. Daughter and sone with whom she lives, carried out of the bathroom and brought her to the ER. She was admitted with dehydration and hypotension, given IV fluids. EKG demonstrated tachycardia, sinus arrythmia and questionable atrial fibrillation.  On arrival to ER she was found to have heart rate of 135 bpm, Hgb of 11.9, Hct 35.1. Elevated LFT';s ALT of 47, AST 48, Alk Phos 298.  K+ 3.5. Creat. 0.44. CT scan Atrophy and chronic ischemic white matter disease without acute intracranial abnormality.Troponin is negative.    She is medically compliant.Last seen by Dr.McAlhany in April of 2014 and was clinicaly stable. Daughter states that she has not regained her strength or appetite since discharge after 2 week hospitalization for appendectomy and ileus in June of 2014. She has lost 20 lbs since that admission. She denies chest pain, DOE, or dizziness. She complained of rapid HR and LE weakness. She is now feeling better and is able to walk without leg weakness. HR has normalized to NSR with PVCs on review of telemetry.   Review of systems complete and found to be negative unless listed above   Past Medical History  Diagnosis Date  . Hemorrhage of gastrointestinal tract, unspecified   .  Other malaise and fatigue   . Tobacco use disorder   . Parotid tumor   . Neoplasm of malignant     breast  ,HX   . Diabetes mellitus   . Osteopenia   . Hypertension   . Hyperlipidemia   . Depression   . Anxiety state, unspecified     Family History  Problem Relation Age of Onset  . Heart failure Mother     CHF  . Cirrhosis Brother   . Lung cancer Father     brain mets  . Kidney cancer Mother   . Kidney cancer Sister     History   Social History  . Marital Status: Divorced    Spouse Name: N/A    Number of Children: N/A  . Years of Education: N/A   Occupational History  . RETIRED FACTORY WORKER     [employer]   Social History Main Topics  . Smoking status: Former Smoker -- 0.50 packs/day for 40 years    Types: Cigarettes    Quit date: 03/30/2013  . Smokeless tobacco: Not on file  . Alcohol Use: No  . Drug Use: No  . Sexually Active: No   Other Topics Concern  . 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 Started age 40.   Drug use -no   Alcohol use - no   Lives with son  Education - 12 th grade                   Past Surgical History  Procedure Laterality Date  . Mastectomy  1994    left breast -related to breast cancer  . Vesicovaginal fistula closure w/ tah  1979  . Cholecystectomy  2008  . Hemorrhoid surgery  1960s  . Laparoscopic appendectomy N/A 04/13/2013    Procedure: APPENDECTOMY LAPAROSCOPIC;  Surgeon: Donato Heinz, MD;  Location: [facility];  Service: General;  Laterality: N/A;  . Appendectomy       Prescriptions prior to admission  Medication Sig Dispense Refill  . acetaminophen (TYLENOL) 500 MG tablet Take 500 mg by mouth every 6 (six) hours as needed for pain.      Marland Kitchen ALPRAZolam (NIRAVAM) 0.25 MG dissolvable tablet Take 0.25 mg by mouth at  bedtime as needed.        Marland Kitchen aspirin 325 MG tablet Take 325 mg by mouth daily.        Marland Kitchen atorvastatin (LIPITOR) 10 MG tablet Take 10 mg by mouth daily.        Marland Kitchen CALCIUM CARBONATE PO Take 1,000 mg by mouth daily.      . cholecalciferol (VITAMIN D) 1000 UNITS tablet Take 1,000 Units by mouth daily.        Marland Kitchen co-enzyme Q-10 30 MG capsule Take 100 mg by mouth daily.      Marland Kitchen docusate sodium (COLACE) 100 MG capsule Take 300 mg by mouth daily as needed for constipation.      Marland Kitchen esomeprazole (NEXIUM) 40 MG capsule Take 40 mg by mouth daily before breakfast.        . fish oil-omega-3 fatty acids 1000 MG capsule Take 2 g by mouth daily.      Marland Kitchen glyBURIDE-metformin (GLUCOVANCE) 5-500 MG per tablet Take 1 tablet by mouth 2 (two) times daily with a meal.       . insulin glargine (LANTUS) 100 UNIT/ML injection Inject 2-8 Units into the skin at bedtime. Give if BS 140-150= 3 units, 151-160=4 units, 161-170=5 units, 171-180= 6 units, >181=7units      . tiotropium (SPIRIVA) 18 MCG inhalation capsule Place 18 mcg into inhaler and inhale daily.        Physical Exam: Blood pressure 150/73, pulse 85, resp. rate 19, height _0  (1.6 m), weight 136 lb 11 oz (62 kg), SpO2 99.00%.   General: Well developed, well nourished, in no acute distress, generalized weakness. Head: Eyes PERRLA, No xanthomas.   Normal cephalic and atramatic  Lungs: Crackles in the RLL without wheezes or coughing. Heart: HRRR S1 S2, occasional extra systole,   Pulses are 2+ & equal.            No carotid bruit. No JVD.  No abdominal bruits.  Abdomen: Bowel sounds are positive, abdomen soft and non-tender without masses or  Hernia's noted. Msk:  Back normal, normal sl;ow gait. Normal strength and tone for age. Extremities: No clubbing, cyanosis or edema.  DP +1 Neuro: Alert and oriented X 3. Psych: Flat affect, responds appropriately   Labs:   Lab Results  Component Value Date   WBC 6.6 06/12/2013   HGB 11.9* 06/12/2013   HCT 35.1* 06/12/2013    MCV 95.4 06/12/2013   PLT 350 06/12/2013    Recent Labs Lab 06/12/13 0403  NA 139  K 3.5  CL 104  CO2 26  BUN 11  CREATININE 0.44*  CALCIUM 9.2  PROT  7.3  BILITOT 0.6  ALKPHOS 298*  ALT 47*  AST 48*  GLUCOSE 188*    Radiology: Ct Head Wo Contrast - If Head Trauma Is Known/suspected And/or Pt Is Taking Anticoagulant  06/12/2013   *RADIOLOGY REPORT*  Clinical Data: Tachycardia.  Lower extremity weakness.  CT HEAD WITHOUT CONTRAST  Technique:  Contiguous axial images were obtained from the base of the skull through the vertex without contrast.  Comparison: 03/28/2010.  Findings: No mass lesion, mass effect, midline shift, hydrocephalus, hemorrhage.  No acute territorial cortical ischemia/infarct. Atrophy and chronic ischemic white matter disease is present.  Calcified scalp lesion in the right frontal region. Vertebral basilar dolichoectasia. Mastoid air cells are within normal limits.  Paranasal sinuses normal.  Calvarium intact.  IMPRESSION: Atrophy and chronic ischemic white matter disease without acute intracranial abnormality.   Original Report Authenticated By: Dereck Ligas, M.D.   Dg Chest Port 1 View  06/12/2013   *RADIOLOGY REPORT*  Clinical Data: Lower extremity weakness.  PORTABLE CHEST - 1 VIEW  Comparison: 05/30/2013.  Findings: Chronic cardiomegaly.  Aortic atherosclerosis.  No change in heart size and mediastinal contours.  Hyperinflated lungs.  Persistent asymmetric density in the medial right base ( this findings seen dating back to 2011).  There is streaky opacity in the retrocardiac lung.  Small pleural effusions may persist.  No pneumothorax.  No acute osseous findings.  IMPRESSION:  1.  Cardiomegaly and pulmonary venous congestion. 2.  Ongoing or recurrent right base infiltrate or atelectasis. 3.  COPD.   Original Report Authenticated By: Jorje Guild   PVG:KKDPTE fibrillation with rapid ventricular response Nonspecific T wave abnormality rate of 113  bpm.  ASSESSMENT AND PLAN:   1. PSVT, possibly re-entrant mechanism or junctional tachycardia: Now converted to NSR with PVC;s per telemetry without use of AV nodal blocking agents. She denies chest pain or rapid HR at this time. Will check D-dimer. Troponin is negative X1. Repeat EKG. LVEF 65-70% in April.  2. CAD:  Cardiac cath in 2011 demonstrated mild disease in the LAD but no obstructive lesions. Continue  ASA and stain.  3. Weakness: No complaints of dizziness or near syncope. LE dopplers were completed in April of 2012 demonstrating no significant occlusive disease in bilateral LE. Carotids normal per Dr. Camillia Herter note.   Signed: Phill Myron. Purcell Nails NP [facility] 06/12/2013, 9:23 AM Co-Sign MD   Attending note:  Patient seen and examined. Reviewed available records and modified above note by Ms. Lawrence NP. Consulted related to rapid heart rate, concern for possible atrial fibrillation. Review of ECG and telemetry strips finds a regular SVT that is likely due a reentrant mechanism or possibly junctional tachycardia. Retrograde P waves are noted suggesting possibly AVRT. She is presently in normal sinus rhythm with occasional ectopy. No definitive evidence of atrial fibrillation. Prior cardiac workup notable for mild atherosclerosis by cardiac catheterization in 2011, recent documentation of vigorous LVEF without major valvular abnormalities.  Will add low-dose beta blocker at this time and observe. She will need to keep follow up with cardiology, office visit can be set up with [name] after discharge.  Satira Sark, M.D., F.A.C.C.

## 2013-06-12 NOTE — ED Notes (Signed)
Per ems, pt with tachycardia up into 140's, improved with vagal maneuvers for short period, now returns to tachy rhythm at 130's

## 2013-06-12 NOTE — ED Provider Notes (Signed)
CSN: [id number]     Arrival date & time 06/12/13  0259 History     First MD Initiated Contact with Patient 06/12/13 [phone]     Chief Complaint  Patient presents with  . Tachycardia   history obtained per patient, daughter and EMS report.  HPI [name] is a 79 y.o. female presents with multiple complaints. Patient complains of a rapid heartbeat however she has no chest pain. She does have some baseline shortness of breath and she does feel mildly short of breath now. Denies any dizziness, lightheadedness, fevers or chills, recent cough, abdominal pain, nausea vomiting. Patient was on the commode earlier this evening, she said her legs felt paralyzed, she could not move them, her son had to lift her off the toilet. The symptoms have resolved. No prior history of CVA. Patient is a pertinent history of coronary artery disease, hypertension, hyperlipidemia and diabetes. No history of atrial fibrillation.  On EMS arrival, patient was tachycardic into the 140s, evidently they tried some vagal maneuvers which failed.  Past Medical History  Diagnosis Date  . Hemorrhage of gastrointestinal tract, unspecified   . Other malaise and fatigue   . Tobacco use disorder   . Parotid tumor   . Neoplasm of malignant     breast  ,HX   . Diabetes mellitus   . Osteopenia   . Hypertension   . Hyperlipidemia   . Depression   . Anxiety state, unspecified    Past Surgical History  Procedure Laterality Date  . Mastectomy  1994    left breast -related to breast cancer  . Vesicovaginal fistula closure w/ tah  1979  . Cholecystectomy  2008  . Hemorrhoid surgery  1960s  . Laparoscopic appendectomy N/A 04/13/2013    Procedure: APPENDECTOMY LAPAROSCOPIC;  Surgeon: Donato Heinz, MD;  Location: [facility];  Service: General;  Laterality: N/A;  . Appendectomy     Family History  Problem Relation Age of Onset  . Heart failure Mother     CHF  . Cirrhosis Brother   . Lung cancer Father     brain mets  .  Kidney cancer Mother   . Kidney cancer Sister    History  Substance Use Topics  . Smoking status: Former Smoker -- 0.50 packs/day for 40 years    Types: Cigarettes    Quit date: 03/30/2013  . Smokeless tobacco: Not on file  . Alcohol Use: No   OB History   Grav Para Term Preterm Abortions TAB SAB Ect Mult Living                 Review of Systems At least 10pt or greater review of systems completed and are negative except where specified in the HPI.  Allergies  Penicillins  Home Medications   Current Outpatient Rx  Name  Route  Sig  Dispense  Refill  . acetaminophen (TYLENOL) 500 MG tablet   Oral   Take 500 mg by mouth every 6 (six) hours as needed for pain.         Marland Kitchen ALPRAZolam (NIRAVAM) 0.25 MG dissolvable tablet   Oral   Take 0.25 mg by mouth at bedtime as needed.           Marland Kitchen aspirin 325 MG tablet   Oral   Take 325 mg by mouth daily.           Marland Kitchen atorvastatin (LIPITOR) 10 MG tablet   Oral   Take 10 mg by mouth daily.           Marland Kitchen  CALCIUM CARBONATE PO   Oral   Take 1,000 mg by mouth daily.         . cholecalciferol (VITAMIN D) 1000 UNITS tablet   Oral   Take 1,000 Units by mouth daily.           Marland Kitchen co-enzyme Q-10 30 MG capsule   Oral   Take 100 mg by mouth daily.         Marland Kitchen docusate sodium (COLACE) 100 MG capsule   Oral   Take 300 mg by mouth daily as needed for constipation.         Marland Kitchen esomeprazole (NEXIUM) 40 MG capsule   Oral   Take 40 mg by mouth daily before breakfast.           . fish oil-omega-3 fatty acids 1000 MG capsule   Oral   Take 2 g by mouth daily.         Marland Kitchen glyBURIDE-metformin (GLUCOVANCE) 5-500 MG per tablet   Oral   Take 1 tablet by mouth 2 (two) times daily with a meal.          . insulin glargine (LANTUS) 100 UNIT/ML injection   Subcutaneous   Inject 2-8 Units into the skin at bedtime. Give if BS 140-150= 3 units, 151-160=4 units, 161-170=5 units, 171-180= 6 units, >181=7units         . tiotropium  (SPIRIVA) 18 MCG inhalation capsule   Inhalation   Place 18 mcg into inhaler and inhale daily.          BP 96/57  Pulse 119  Resp 25  SpO2 99% Physical Exam  Nursing notes reviewed.  Electronic medical record reviewed. VITAL SIGNS:   Filed Vitals:   06/12/13 0445 06/12/13 0500 06/12/13 0614 06/12/13 0649  BP:  96/57 111/93   Pulse: 125 119 124 89  Resp: _0 SpO2: 98% 99% 100% 98%   CONSTITUTIONAL: Awake, oriented, appears non-toxic HENT: Atraumatic, normocephalic, oral mucosa pink and moist, airway patent. Nares patent without drainage. External ears normal. EYES: Conjunctiva clear, EOMI, PERRLA NECK: Trachea midline, non-tender, supple CARDIOVASCULAR: Normal heart rate, Normal rhythm, No murmurs, rubs, gallops PULMONARY/CHEST: Clear to auscultation, no rhonchi, wheezes, or rales. Symmetrical breath sounds. Non-tender. ABDOMINAL: Non-distended, soft, non-tender - no rebound or guarding.  BS normal. NEUROLOGIC: Non-focal, moving all four extremities, strength is age appropriate 5 out of 5 in the upper and lower extremities flexors and extensors. No gross sensory or motor deficits. Cranial nerves are unremarkable. EXTREMITIES: No clubbing, cyanosis, or edema SKIN: Warm, Dry, No erythema, No rash  ED Course   Procedures (including critical care time)  Date: 06/12/2013 0301  Rate: 131  Rhythm: Tachycardia  QRS Axis: normal  Intervals: normal  ST/T Wave abnormalities: normal  Conduction Disutrbances: none  Narrative Interpretation: Junctional tachycardia with occasional sinus beats     Date: 06/12/2013 0459  Rate: 113  Rhythm: normal sinus rhythm  QRS Axis: normal  Intervals: normal  ST/T Wave abnormalities: normal  Conduction Disutrbances: none  Narrative Interpretation: Junctional tachycardia with occasional sinus beats  Prior EKG from 05/30/2013 accelerated junctional rhythm with similar morphology to the EKG seen today, no ST or T wave abnormalities to  suggest acute ischemia or infarction    Labs Reviewed  CBC - Abnormal; Notable for the following:    RBC 3.68 (*)    Hemoglobin 11.9 (*)    HCT 35.1 (*)    All other components within normal limits  URINALYSIS, ROUTINE  W REFLEX MICROSCOPIC - Abnormal; Notable for the following:    Hgb urine dipstick TRACE (*)    Ketones, ur TRACE (*)    Leukocytes, UA SMALL (*)    All other components within normal limits  GLUCOSE, CAPILLARY - Abnormal; Notable for the following:    Glucose-Capillary 179 (*)    All other components within normal limits  TROPONIN I  URINE MICROSCOPIC-ADD ON  COMPREHENSIVE METABOLIC PANEL  MAGNESIUM   Ct Head Wo Contrast - If Head Trauma Is Known/suspected And/or Pt Is Taking Anticoagulant  06/12/2013   *RADIOLOGY REPORT*  Clinical Data: Tachycardia.  Lower extremity weakness.  CT HEAD WITHOUT CONTRAST  Technique:  Contiguous axial images were obtained from the base of the skull through the vertex without contrast.  Comparison: 03/28/2010.  Findings: No mass lesion, mass effect, midline shift, hydrocephalus, hemorrhage.  No acute territorial cortical ischemia/infarct. Atrophy and chronic ischemic white matter disease is present.  Calcified scalp lesion in the right frontal region. Vertebral basilar dolichoectasia. Mastoid air cells are within normal limits.  Paranasal sinuses normal.  Calvarium intact.  IMPRESSION: Atrophy and chronic ischemic white matter disease without acute intracranial abnormality.   Original Report Authenticated By: Dereck Ligas, M.D.   Dg Chest Port 1 View  06/12/2013   *RADIOLOGY REPORT*  Clinical Data: Lower extremity weakness.  PORTABLE CHEST - 1 VIEW  Comparison: 05/30/2013.  Findings: Chronic cardiomegaly.  Aortic atherosclerosis.  No change in heart size and mediastinal contours.  Hyperinflated lungs.  Persistent asymmetric density in the medial right base ( this findings seen dating back to 2011).  There is streaky opacity in the retrocardiac  lung.  Small pleural effusions may persist.  No pneumothorax.  No acute osseous findings.  IMPRESSION:  1.  Cardiomegaly and pulmonary venous congestion. 2.  Ongoing or recurrent right base infiltrate or atelectasis. 3.  COPD.   Original Report Authenticated By: Jorje Guild   1. Tachycardia   2. Transient limb paralysis   3. Poor fluid intake     MDM  Patient arrives from home with a chief concern for transient leg paralysis bilaterally, which do not correlate with CVA, however could be possible with anterior cord transient ischemic attack initially considered global weakness, patient has had a history of electrolyte derangements including hyponatremia and hypokalemia, secondary to poor nutritional intake and fluid intake - especially coupled with the patient's tachycardia. Patient given several boluses of 500 cc normal saline, tachycardia slowly resolved, however with patient's chest x-ray showing already congested vascular system, some mild blunting of the diaphragmatic regions suggestive of pleural effusion which is chronic, I think adding additional intravenous fluid runs a high risk of third spacing and respiratory failure secondary to pulmonary edema and acute congestive heart failure.  I do not think there is an ischemic event going on right now the patient's heart, hemoglobin is adequate, kidney function is within normal limits. Some mild transaminitis, alkaline phosphatase is elevated at 298-on review of prior CAT scans, she does have some chronic dilation of her biliary system. There is no acute elevation of bilirubin at this time to suggest an obstructive process. Sodium is within normal limits, magnesium is slightly low. CT of the head is unremarkable for acute abnormality.  EKGs are successively abnormal, there is a tolerated junctional rhythm with persist tachycardia - again I do not want to give her any more intravenous fluids with concern for exacerbating her breathing status. Admit  the patient for observation secondary to transient limb paralysis, persistent  tachycardia, overall clinical suspicion is of failure to thrive, poor nutrition/po intake. Patient's blood pressure has been normotensive, admitted stable.  Discussed with Dr. Megan Salon triad hospitalists, for admission to telemetry-admitting for Dr. Legrand Rams.     Rhunette Croft, MD 06/12/13 [phone]

## 2013-06-12 NOTE — Evaluation (Signed)
Physical Therapy Evaluation Patient Details Name: [name] MRN: [id number] DOB: 12/27/1930 Today's Date: 06/12/2013 Time:1400   - 43    PT Assessment / Plan / Recommendation History of Present Illness   [name] is an 79 yo female who lives with her daughter.  She was admitted to [facility] with LE weakness.  Pt's daughter states that the patient was doing very well since her last discharge going up and down steps I.  But she was unable to get off of the commode therefore her family brought her to the ER>  Clinical Impression  Exam shows minimal strength deficits; slight mm imbalance.    PT Assessment  Patient needs continued PT services    Follow Up Recommendations  No PT follow up    Does the patient have the potential to tolerate intense rehabilitation    N/A  Barriers to Discharge  none      Equipment Recommendations  None recommended by PT    Recommendations for Other Services   none  Frequency Min 2X/week    Precautions / Restrictions Precautions Precautions: None Restrictions Weight Bearing Restrictions: No   Pertinent Vitals/Pain 0/10      Mobility  Bed Mobility Supine to Sit: 6: Modified independent (Device/Increase time) Transfers Transfers: Sit to Stand Sit to Stand: 6: Modified independent (Device/Increase time) Ambulation/Gait Ambulation/Gait Assistance: 6: Modified independent (Device/Increase time) Ambulation Distance (Feet): 200 Feet Assistive device: Rolling walker Gait Pattern: Within Functional Limits Gait velocity: slow    Exercises General Exercises - Lower Extremity Long Arc Quad: 10 reps Heel Raises: 10 reps Mini-Sqauts: 10 reps   PT Diagnosis: Generalized weakness  PT Problem List: Decreased balance PT Treatment Interventions: Gait training;Therapeutic exercise     PT Goals(Current goals can be found in the care plan section)    Visit Information  Last PT Received On: 06/12/13       Prior [place] expects to be discharged to:: Private residence Living Arrangements: Children Available Help at Discharge: Family;Available 24 hours/day Type of Home: House Home Access: Stairs to enter [employer] of Steps: 5 Entrance Stairs-Rails: Right;Left Home Layout: One level Home Equipment: Walker - 2 wheels;Cane - single point;Bedside commode;Shower seat;Grab bars - toilet Prior Function Level of Independence: Independent with assistive device(s) Communication Communication: No difficulties    Cognition  Cognition Arousal/Alertness: Awake/alert Behavior During Therapy: WFL for tasks assessed/performed Overall Cognitive Status: Within Functional Limits for tasks assessed    Extremity/Trunk Assessment Lower Extremity Assessment Lower Extremity Assessment: Overall WFL for tasks assessed   Balance    End of Session PT - End of Session Equipment Utilized During Treatment: Gait belt Activity Tolerance: Patient tolerated treatment well Patient left: in chair;with call bell/phone within reach;with family/visitor present  GP     Otis Portal,CINDY 06/12/2013, 3:35 PM

## 2013-06-12 NOTE — Consult Note (Signed)
[place] A. Merlene Laughter, MD     [email or web address]          [name] is an 79 y.o. female.   ASSESSMENT/PLAN: 1. Acute gait impairment of unclear etiology. She appears to have evidence of hypovolemia/hypotension on her previous admission but this is not the case during this workup. The patient's complaint of proximal leg weakness of upper extremities along with headaches is worrisome for possible vertebrobasilar insufficiency/ischemia. She does have a history of diabetes but does not appears to have much of a severe neuropathy on physical examination. The patient will be worked up with MRI of the brain along with MRA. Carotids are also be obtained. Although the there is not a lot of evidence of neuropathy limited neuropathy workup will also be done. We also may want to consider repeating orthostatics.  The patient is a 79 year old black female who woke up from sleep day ago with severe headache. The headaches were almost maximum at onset being 9/10. She would use the restroom and on trying to get up she reports that her legs went weak/paralyzed. She was not able to stand up and required assistance in standing. She also reports having weakness of the upper extremities. The patient does not report having loss of consciousness, dysarthria, dysphasia, diplopia, chest pain or shortness of breath. No other symptoms are reported. She was taken to the hospital where she was worked up and so far the workup has been unremarkable. The patient was recently discharged from the hospital about a week ago with gait impairment. The workup at that time was significant for significant hypertension. She was orthostatic and was associated with dehydration. She had drop in her systolic blood pressure to close to 20 and also a 20 point drop in diastolic blood pressure from 60-40 on standing. She was on antihypertensive medications and these were discontinued. The history is obtained from the daughter  tells me that the patient was up ambulating and independent until she was ill with GI symptoms earlier this year. She was ambulating with a walker at a time. It appears that her gait impairment with down turning again when she fell a week ago due to the hypotension. It appears that the patient no longer needs antihypertensive blood pressure after losing some weight due to her recent illness. The patient tells me that she feels a lot better since hospitalized overnight. Review of systems is otherwise negative other than stated in the history of present illness.  GENERAL:  This is a thin pleasant lady in no acute distress.  HEENT: Unremarkable.  ABDOMEN: soft  EXTREMITIES: No edema   BACK: Unremarkable.  SKIN: Normal by inspection.    MENTAL STATUS: Alert and oriented 3. Speech, language and cognition are generally intact. Judgment and insight normal.   CRANIAL NERVES: Pupils are equal, round and reactive to light and accommodation; extra ocular movements are full, there is no significant nystagmus; visual fields are full; upper and lower facial muscles are normal in strength and symmetric, there is no flattening of the nasolabial folds; tongue is midline; uvula is midline; shoulder elevation is normal.  MOTOR: Normal tone, bulk and strength in the legs; no pronator drift. There is mild proximal motor weakness bilaterally with deltoids being 4/5 and triceps 4+. Distal strength is normal.  COORDINATION: Left finger to nose is normal, right finger to nose is normal, No rest tremor; no intention tremor; no postural tremor; no bradykinesia. Additionally, heel-to-shin is normal bilaterally. There is no dysmetria.  REFLEXES:  Deep tendon reflexes are symmetrical and normal. Plantar responses are flexor bilaterally.   SENSATION: Normal to light touch and temperature.     Past Medical History  Diagnosis Date  . Hemorrhage of gastrointestinal tract, unspecified   . Other malaise and fatigue   .  Tobacco use disorder   . Parotid tumor   . Neoplasm of malignant     breast  ,HX   . Diabetes mellitus   . Osteopenia   . Hypertension   . Hyperlipidemia   . Depression   . Anxiety state, unspecified     Past Surgical History  Procedure Laterality Date  . Mastectomy  1994    left breast -related to breast cancer  . Vesicovaginal fistula closure w/ tah  1979  . Cholecystectomy  2008  . Hemorrhoid surgery  1960s  . Laparoscopic appendectomy N/A 04/13/2013    Procedure: APPENDECTOMY LAPAROSCOPIC;  Surgeon: Donato Heinz, MD;  Location: [facility];  Service: General;  Laterality: N/A;  . Appendectomy      Family History  Problem Relation Age of Onset  . Heart failure Mother     CHF  . Cirrhosis Brother   . Lung cancer Father     brain mets  . Kidney cancer Mother   . Kidney cancer Sister     Social History:  reports that she quit smoking about 2 months ago. Her smoking use included Cigarettes. She has a 20 pack-year smoking history. She does not have any smokeless tobacco history on file. She reports that she does not drink alcohol or use illicit drugs.  Allergies:  Allergies  Allergen Reactions  . Penicillins Rash    Medications: Prior to Admission medications   Medication Sig Start Date End Date Taking? Authorizing Provider  acetaminophen (TYLENOL) 500 MG tablet Take 500 mg by mouth every 6 (six) hours as needed for pain.   Yes Historical Provider, MD  ALPRAZolam (NIRAVAM) 0.25 MG dissolvable tablet Take 0.25 mg by mouth at bedtime as needed.     Yes Historical Provider, MD  aspirin 325 MG tablet Take 325 mg by mouth daily.     Yes Historical Provider, MD  atorvastatin (LIPITOR) 10 MG tablet Take 10 mg by mouth daily.     Yes Historical Provider, MD  CALCIUM CARBONATE PO Take 1,000 mg by mouth daily.   Yes Historical Provider, MD  cholecalciferol (VITAMIN D) 1000 UNITS tablet Take 1,000 Units by mouth daily.     Yes Historical Provider, MD  co-enzyme Q-10 30 MG capsule  Take 100 mg by mouth daily.   Yes Historical Provider, MD  docusate sodium (COLACE) 100 MG capsule Take 300 mg by mouth daily as needed for constipation.   Yes Historical Provider, MD  esomeprazole (NEXIUM) 40 MG capsule Take 40 mg by mouth daily before breakfast.     Yes Historical Provider, MD  fish oil-omega-3 fatty acids 1000 MG capsule Take 2 g by mouth daily.   Yes Historical Provider, MD  glyBURIDE-metformin (GLUCOVANCE) 5-500 MG per tablet Take 1 tablet by mouth 2 (two) times daily with a meal.    Yes Historical Provider, MD  insulin glargine (LANTUS) 100 UNIT/ML injection Inject 2-8 Units into the skin at bedtime. Give if BS 140-150= 3 units, 151-160=4 units, 161-170=5 units, 171-180= 6 units, >181=7units    Historical Provider, MD  tiotropium (SPIRIVA) 18 MCG inhalation capsule Place 18 mcg into inhaler and inhale daily.    Historical Provider, MD    Scheduled Meds:  Continuous Infusions:  PRN Meds:.ondansetron (ZOFRAN) IV  Blood pressure 111/93, pulse 89, resp. rate 23, SpO2 99.00%.   Results for orders placed during the hospital encounter of 06/12/13 (from the past 48 hour(s))  GLUCOSE, CAPILLARY     Status: Abnormal   Collection Time    06/12/13  4:02 AM      Result Value Range   Glucose-Capillary 179 (*) 70 - 99 mg/dL  CBC     Status: Abnormal   Collection Time    06/12/13  4:03 AM      Result Value Range   WBC 6.6  4.0 - 10.5 K/uL   RBC 3.68 (*) 3.87 - 5.11 MIL/uL   Hemoglobin 11.9 (*) 12.0 - 15.0 g/dL   HCT 35.1 (*) 36.0 - 46.0 %   MCV 95.4  78.0 - 100.0 fL   MCH 32.3  26.0 - 34.0 pg   MCHC 33.9  30.0 - 36.0 g/dL   RDW 14.2  11.5 - 15.5 %   Platelets 350  150 - 400 K/uL  COMPREHENSIVE METABOLIC PANEL     Status: Abnormal   Collection Time    06/12/13  4:03 AM      Result Value Range   Sodium 139  135 - 145 mEq/L   Potassium 3.5  3.5 - 5.1 mEq/L   Chloride 104  96 - 112 mEq/L   CO2 26  19 - 32 mEq/L   Glucose, Bld 188 (*) 70 - 99 mg/dL   BUN 11  6 - 23 mg/dL    Creatinine, Ser 0.44 (*) 0.50 - 1.10 mg/dL   Calcium 9.2  8.4 - 10.5 mg/dL   Total Protein 7.3  6.0 - 8.3 g/dL   Albumin 3.4 (*) 3.5 - 5.2 g/dL   AST 48 (*) 0 - 37 U/L   ALT 47 (*) 0 - 35 U/L   Alkaline Phosphatase 298 (*) 39 - 117 U/L   Total Bilirubin 0.6  0.3 - 1.2 mg/dL   GFR calc non Af Amer >90  >90 mL/min   GFR calc Af Amer >90  >90 mL/min   Comment:            The eGFR has been calculated     using the CKD EPI equation.     This calculation has not been     validated in all clinical     situations.     eGFR's persistently     <90 mL/min signify     possible Chronic Kidney Disease.  MAGNESIUM     Status: Abnormal   Collection Time    06/12/13  4:03 AM      Result Value Range   Magnesium 1.4 (*) 1.5 - 2.5 mg/dL  TROPONIN I     Status: None   Collection Time    06/12/13  4:03 AM      Result Value Range   Troponin I <0.30  <0.30 ng/mL   Comment:            Due to the release kinetics of cTnI,     a negative result within the first hours     of the onset of symptoms does not rule out     myocardial infarction with certainty.     If myocardial infarction is still suspected,     repeat the test at appropriate intervals.  URINALYSIS, ROUTINE W REFLEX MICROSCOPIC     Status: Abnormal   Collection Time    06/12/13  4:23  AM      Result Value Range   Color, Urine YELLOW  YELLOW   APPearance CLEAR  CLEAR   Specific Gravity, Urine 1.010  1.005 - 1.030   pH 7.0  5.0 - 8.0   Glucose, UA NEGATIVE  NEGATIVE mg/dL   Hgb urine dipstick TRACE (*) NEGATIVE   Bilirubin Urine NEGATIVE  NEGATIVE   Ketones, ur TRACE (*) NEGATIVE mg/dL   Protein, ur NEGATIVE  NEGATIVE mg/dL   Urobilinogen, UA 0.2  0.0 - 1.0 mg/dL   Nitrite NEGATIVE  NEGATIVE   Leukocytes, UA SMALL (*) NEGATIVE  URINE MICROSCOPIC-ADD ON     Status: None   Collection Time    06/12/13  4:23 AM      Result Value Range   Squamous Epithelial / LPF RARE  RARE   WBC, UA 0-2  <3 WBC/hpf   RBC / HPF 0-2  <3 RBC/hpf    Bacteria, UA RARE  RARE    Ct Head Wo Contrast - If Head Trauma Is Known/suspected And/or Pt Is Taking Anticoagulant  06/12/2013   *RADIOLOGY REPORT*  Clinical Data: Tachycardia.  Lower extremity weakness.  CT HEAD WITHOUT CONTRAST  Technique:  Contiguous axial images were obtained from the base of the skull through the vertex without contrast.  Comparison: 03/28/2010.  Findings: No mass lesion, mass effect, midline shift, hydrocephalus, hemorrhage.  No acute territorial cortical ischemia/infarct. Atrophy and chronic ischemic white matter disease is present.  Calcified scalp lesion in the right frontal region. Vertebral basilar dolichoectasia. Mastoid air cells are within normal limits.  Paranasal sinuses normal.  Calvarium intact.  IMPRESSION: Atrophy and chronic ischemic white matter disease without acute intracranial abnormality.   Original Report Authenticated By: Dereck Ligas, M.D.   Dg Chest Port 1 View  06/12/2013   *RADIOLOGY REPORT*  Clinical Data: Lower extremity weakness.  PORTABLE CHEST - 1 VIEW  Comparison: 05/30/2013.  Findings: Chronic cardiomegaly.  Aortic atherosclerosis.  No change in heart size and mediastinal contours.  Hyperinflated lungs.  Persistent asymmetric density in the medial right base ( this findings seen dating back to 2011).  There is streaky opacity in the retrocardiac lung.  Small pleural effusions may persist.  No pneumothorax.  No acute osseous findings.  IMPRESSION:  1.  Cardiomegaly and pulmonary venous congestion. 2.  Ongoing or recurrent right base infiltrate or atelectasis. 3.  COPD.   Original Report Authenticated By: Jorje Guild        [place] A. Merlene Laughter, M.D.  Diplomate, Tax adviser of Psychiatry and Neurology ( Neurology). 06/12/2013, 8:30 AM

## 2013-06-12 NOTE — H&P (Signed)
[facility] History and Physical  [name] VEH:[id number] DOB: May 26, 1931 DOA: 06/12/2013  Referring physician:  PCP: [name], MD  Specialists:   Chief Complaint:   HPI: [name] is a 79 y.o. female with past medical history that includes diabetes, hypertension, CAD, recent appendectomy complicated by ileus, breast cancer who presents to the emergency room with a chief complaint of lower extremity weakness. Information is obtained from the patient and the daughter is at the bedside. Patient states that she was doing well at home since her discharge 13 days ago after a 2 day hospitalization do to generalized weakness hypotension tachycardia and hyponatremia until this morning when she went to the bathroom. She states she ambulated to the bathroom early this morning without trouble she had a small bowel movement and then was unable to get off of the commode. She reports that she had to call for assistance to get up off of the commode and was carried to bed. She states she was unable to bear weight. She denies any pain in her lower back or lower extremities. She denies any numbness or to a 9 over lower extremities. She denies any chest pain palpitation shortness of breath. She does state that since her discharge she has experienced intermittent dizziness when going from sitting to standing. In addition she ambulates with a walker and a band prior to her hospitalization in May she was ambulating independently. She denies any fever chills dysuria hematuria constipation melena. She denies any abdominal pain and her daughter reports that her by mouth intake has been adequate with much encouragement. Daughter reports that her appetite is usually good for the breakfast meal and then as the day wears on she is less interested in food. She patient reports that the food smells bad and very bland. She denies abdominal pain distention constipation diarrhea. In the emergency room vital signs  significant for a heart rate of 128. Lab work in the emergency room significant for serum glucose of 188 magnesium 1.4 alkaline phosphatase 298 AST 48 ALT 47. Chest x-ray yields cardiomegaly and pulmonary venous congestion with ongoing or recurrent right base infiltrate or atelectasis. Symptoms came on suddenly have improved characterized as mild to moderate. [facility] were asked to admit   Review of Systems: The patient denies  fever, weight loss,, vision loss, decreased hearing, hoarseness, chest pain, syncope, dyspnea on exertion, peripheral edema,  hemoptysis, abdominal pain, melena, hematochezia, severe indigestion/heartburn, hematuria, incontinence, genital sores,  suspicious skin lesions, transient blindness, depression, unusual weight change, abnormal bleeding, enlarged lymph nodes, angioedema, and breast masses.    Past Medical History  Diagnosis Date  . Hemorrhage of gastrointestinal tract, unspecified   . Other malaise and fatigue   . Tobacco use disorder   . Parotid tumor   . Neoplasm of malignant     breast  ,HX   . Diabetes mellitus   . Osteopenia   . Hypertension   . Hyperlipidemia   . Depression   . Anxiety state, unspecified    Past Surgical History  Procedure Laterality Date  . Mastectomy  1994    left breast -related to breast cancer  . Vesicovaginal fistula closure w/ tah  1979  . Cholecystectomy  2008  . Hemorrhoid surgery  1960s  . Laparoscopic appendectomy N/A 04/13/2013    Procedure: APPENDECTOMY LAPAROSCOPIC;  Surgeon: Donato Heinz, MD;  Location: [facility];  Service: General;  Laterality: N/A;  . Appendectomy     Social History:  reports that she  quit smoking about 2 months ago. Her smoking use included Cigarettes. She has a 20 pack-year smoking history. She does not have any smokeless tobacco history on file. She reports that she does not drink alcohol or use illicit drugs. Patient lives at home with her daughter and son. She stopped smoking 2  months ago and denies alcohol use Allergies  Allergen Reactions  . Penicillins Rash    Family History  Problem Relation Age of Onset  . Heart failure Mother     CHF  . Cirrhosis Brother   . Lung cancer Father     brain mets  . Kidney cancer Mother   . Kidney cancer Sister      Prior to Admission medications   Medication Sig Start Date End Date Taking? Authorizing Provider  acetaminophen (TYLENOL) 500 MG tablet Take 500 mg by mouth every 6 (six) hours as needed for pain.   Yes Historical Provider, MD  ALPRAZolam (NIRAVAM) 0.25 MG dissolvable tablet Take 0.25 mg by mouth at bedtime as needed.     Yes Historical Provider, MD  aspirin 325 MG tablet Take 325 mg by mouth daily.     Yes Historical Provider, MD  atorvastatin (LIPITOR) 10 MG tablet Take 10 mg by mouth daily.     Yes Historical Provider, MD  CALCIUM CARBONATE PO Take 1,000 mg by mouth daily.   Yes Historical Provider, MD  cholecalciferol (VITAMIN D) 1000 UNITS tablet Take 1,000 Units by mouth daily.     Yes Historical Provider, MD  co-enzyme Q-10 30 MG capsule Take 100 mg by mouth daily.   Yes Historical Provider, MD  docusate sodium (COLACE) 100 MG capsule Take 300 mg by mouth daily as needed for constipation.   Yes Historical Provider, MD  esomeprazole (NEXIUM) 40 MG capsule Take 40 mg by mouth daily before breakfast.     Yes Historical Provider, MD  fish oil-omega-3 fatty acids 1000 MG capsule Take 2 g by mouth daily.   Yes Historical Provider, MD  glyBURIDE-metformin (GLUCOVANCE) 5-500 MG per tablet Take 1 tablet by mouth 2 (two) times daily with a meal.    Yes Historical Provider, MD  insulin glargine (LANTUS) 100 UNIT/ML injection Inject 2-8 Units into the skin at bedtime. Give if BS 140-150= 3 units, 151-160=4 units, 161-170=5 units, 171-180= 6 units, >181=7units    Historical Provider, MD  tiotropium (SPIRIVA) 18 MCG inhalation capsule Place 18 mcg into inhaler and inhale daily.    Historical Provider, MD   Physical  Exam: Filed Vitals:   06/12/13 5643 06/12/13 0700 06/12/13 0811 06/12/13 0821  BP:  150/73    Pulse: 89 85    Resp: 23 19    Height:    _0  (1.6 m)  Weight:    136 lb 11 oz (62 kg)  SpO2: 98% 99% 99%      General:  Well-nourished no acute distress  Eyes: The ER RRL, EOMI, no scleral icterus  ENT: Ears clear nose without drainage oropharynx without erythema or exudate mucous membranes of her mouth are pink and moist  Neck: Supple no JVD full range of motion no lymphadenopathy  Cardiovascular: Regular rate and rhythm no murmur no gallop no rub no lower extremity edema  Respiratory: Normal effort with conversation breath sounds with fine crackles bilateral bases no rhonchi no wheeze  Abdomen: Soft positive bowel sounds nontender to palpation no mass organomegaly noted no guarding  Skin: Cool dry no rash no lesions  Musculoskeletal:  No clubbing no  cyanosis, lower extremity strength 5/ 5 bilaterally sensation intact bilaterally  Psychiatric: Calm talkative but unengaged in interview and exam  Neurologic: Speech clear facial symmetry cranial nerves II through XII grossly intact  Labs on Admission:  Basic Metabolic Panel:  Recent Labs Lab 06/12/13 0403  NA 139  K 3.5  CL 104  CO2 26  GLUCOSE 188*  BUN 11  CREATININE 0.44*  CALCIUM 9.2  MG 1.4*   Liver Function Tests:  Recent Labs Lab 06/12/13 0403  AST 48*  ALT 47*  ALKPHOS 298*  BILITOT 0.6  PROT 7.3  ALBUMIN 3.4*   No results found for this basename: LIPASE, AMYLASE,  in the last 168 hours No results found for this basename: AMMONIA,  in the last 168 hours CBC:  Recent Labs Lab 06/12/13 0403  WBC 6.6  HGB 11.9*  HCT 35.1*  MCV 95.4  PLT 350   Cardiac Enzymes:  Recent Labs Lab 06/12/13 0403  TROPONINI <0.30    BNP (last 3 results) No results found for this basename: PROBNP,  in the last 8760 hours CBG:  Recent Labs Lab 06/12/13 0402  GLUCAP 179*    Radiological Exams on  Admission: Ct Head Wo Contrast - If Head Trauma Is Known/suspected And/or Pt Is Taking Anticoagulant  06/12/2013   *RADIOLOGY REPORT*  Clinical Data: Tachycardia.  Lower extremity weakness.  CT HEAD WITHOUT CONTRAST  Technique:  Contiguous axial images were obtained from the base of the skull through the vertex without contrast.  Comparison: 03/28/2010.  Findings: No mass lesion, mass effect, midline shift, hydrocephalus, hemorrhage.  No acute territorial cortical ischemia/infarct. Atrophy and chronic ischemic white matter disease is present.  Calcified scalp lesion in the right frontal region. Vertebral basilar dolichoectasia. Mastoid air cells are within normal limits.  Paranasal sinuses normal.  Calvarium intact.  IMPRESSION: Atrophy and chronic ischemic white matter disease without acute intracranial abnormality.   Original Report Authenticated By: Dereck Ligas, M.D.   Dg Chest Port 1 View  06/12/2013   *RADIOLOGY REPORT*  Clinical Data: Lower extremity weakness.  PORTABLE CHEST - 1 VIEW  Comparison: 05/30/2013.  Findings: Chronic cardiomegaly.  Aortic atherosclerosis.  No change in heart size and mediastinal contours.  Hyperinflated lungs.  Persistent asymmetric density in the medial right base ( this findings seen dating back to 2011).  There is streaky opacity in the retrocardiac lung.  Small pleural effusions may persist.  No pneumothorax.  No acute osseous findings.  IMPRESSION:  1.  Cardiomegaly and pulmonary venous congestion. 2.  Ongoing or recurrent right base infiltrate or atelectasis. 3.  COPD.   Original Report Authenticated By: Jorje Guild    EKG: Independently reviewed. Junctional tachycardia with occasional sinus beats      Assessment/Plan Principal Problem:   Tachycardia: Etiology uncertain however could be related to some dehydration given that currently resolved after several boluses of 500 cc normal saline given in the emergency department. Telemetry now shows normal sinus  rhythm with a rate of 76. Will admit to telemetry for observation. Will request cardiology consult. Will cycle cardiac enzymes    Leg weakness, bilateral: CT of the head yields atrophy and chronic ischemic white matter without acute intracranial abnormality. history of same during recent hospitalization. Chest x-ray and urinalysis unremarkable. No white count. Afebrile. Will request PT OT evaluation. Neurology consult requested   Elevated transaminase level: Etiology certain. Review indicates some chronic dilation of her biliary system. Bilirubin within normal limits. Benign exam. Will recheck in the morning. If  no improvement consider further workup  Hypomagnesemia: Mild. Will replete and recheck  Active Problems:   DIABETES MELLITUS, TYPE II, UNCONTROLLED: Recent A1c 6.5. Will hold oral agents for now and provide Lantus 5 units and sliding scale insulin. We'll provide car modified diet    HYPERLIPIDEMIA: Continue Lipitor    ANXIETY/depression: Appears stable at baseline    HYPERTENSION: Since antihypertensive agents specifically amlodipine and benazepril discontinued at discharge July 15 22 hypotension. Followup appointment with PCP 3 days ago these agents were not resumed. Current blood pressure somewhat variable and soft at times. Will check orthostatics and monitor    CORONARY ATHEROSCLEROSIS NATIVE CORONARY ARTERY: No chest pain see #1. Continue aspirin    Code Status: Full Family Communication: Daughter at bedside Disposition Plan: Home. Hopefully in 24-48 hours Time spent: 65 minutes  [facility] Pager [phone]  If 7PM-7AM, please contact night-coverage [email or web address] Password [facility] 06/12/2013, 9:03 AM

## 2013-06-12 NOTE — H&P (Signed)
Patient seen, independently examined and chart reviewed. I agree with exam, assessment and plan discussed with Dyanne Carrel, NP.  Very pleasant 79 year old woman known to me from previous admission earlier this month. At that time she was admitted for hypotension, failure to thrive, hyponatremia and tachycardia; all felt to be secondary to poor oral intake and failure to thrive. She was hospitalized a few days and then went home. Per her daughter she is continued to require a walker but has not had any falls. Appetite and oral intake has been somewhat better. This morning the patient got up to go to the bathroom, sat down on the commode. She was feeling "hot" and lightheaded. While on the commode she had an episode of weakness which she felt like she could not move her legs and felt generally weak. She had no difficulty speaking, swallowing or upper extremity weakness. She did not have syncope. No fall. She required her son to lift her up and take her to her bed. Her lower extremity weakness resolved over the course of approximately one and a half hours, spontaneously. She has had no recurrent weakness. In the emergency department her exam was noted to be nonfocal but she was tachycardic, this improved with IV fluids. Her laboratory studies were notable for normal BUN and creatinine. Normal electrolytes. Alkaline phosphatase, AST and ALT were somewhat elevated. Her hemoglobin was stable. Her EKG revealed narrow complex tachycardia.  Currently she feels fine and has no neurologic complaints. Her lower extremity weakness has not recurred. On exam she has excellent upper and lower extremity strength. Her sensation is grossly intact in her extremities. Her speech is fluent and clear. Cranial nerves appear intact. Her hypotension remains resolved. Etiology of elevated LFTs is unclear but she is asymptomatic. Follow clinically. Clinical impression is failure to thrive, which is likely the etiology of her tachycardia  (decreased oral intake). I doubt true paralysis of her lower extremities given spontaneous resolution and nonfocal exam. Appreciate neurology consultation and cardiology consultations. Continue evaluation per neurology, physical therapy evaluation.  Murray Hodgkins, MD [facility] [phone]

## 2013-06-13 DIAGNOSIS — R000 Tachycardia, unspecified: Secondary | ICD-10-CM

## 2013-06-13 DIAGNOSIS — I471 Supraventricular tachycardia: Secondary | ICD-10-CM | POA: Diagnosis not present

## 2013-06-13 DIAGNOSIS — I951 Orthostatic hypotension: Secondary | ICD-10-CM | POA: Diagnosis not present

## 2013-06-13 DIAGNOSIS — I2510 Atherosclerotic heart disease of native coronary artery without angina pectoris: Secondary | ICD-10-CM | POA: Diagnosis not present

## 2013-06-13 DIAGNOSIS — I10 Essential (primary) hypertension: Secondary | ICD-10-CM | POA: Diagnosis not present

## 2013-06-13 DIAGNOSIS — E119 Type 2 diabetes mellitus without complications: Secondary | ICD-10-CM | POA: Diagnosis not present

## 2013-06-13 DIAGNOSIS — I498 Other specified cardiac arrhythmias: Secondary | ICD-10-CM | POA: Diagnosis not present

## 2013-06-13 LAB — BASIC METABOLIC PANEL
BUN: 8 mg/dL (ref 6–23)
CO2: 28 mEq/L (ref 19–32)
Calcium: 9.7 mg/dL (ref 8.4–10.5)
Chloride: 98 mEq/L (ref 96–112)
Creatinine, Ser: 0.4 mg/dL — ABNORMAL LOW (ref 0.50–1.10)
GFR calc Af Amer: >90 mL/min (ref >90)
GFR calc non Af Amer: >90 mL/min (ref >90)
Glucose, Bld: 192 mg/dL — ABNORMAL HIGH (ref 70–99)
Potassium: 3.4 mEq/L — ABNORMAL LOW (ref 3.5–5.1)
Sodium: 134 mEq/L — ABNORMAL LOW (ref 135–145)

## 2013-06-13 LAB — GLUCOSE, CAPILLARY
Glucose-Capillary: 183 mg/dL — ABNORMAL HIGH (ref 70–99)
Glucose-Capillary: 117 mg/dL — ABNORMAL HIGH (ref 70–99)
Glucose-Capillary: 155 mg/dL — ABNORMAL HIGH (ref 70–99)
Glucose-Capillary: 116 mg/dL — ABNORMAL HIGH (ref 70–99)

## 2013-06-13 LAB — CBC
HCT: 38.1 % (ref 36.0–46.0)
Hemoglobin: 13 g/dL (ref 12.0–15.0)
MCH: 32.5 pg (ref 26.0–34.0)
MCHC: 34.1 g/dL (ref 30.0–36.0)
MCV: 95.3 fL (ref 78.0–100.0)
Platelets: 393 10*3/uL (ref 150–400)
RBC: 4 MIL/uL (ref 3.87–5.11)
RDW: 14.3 % (ref 11.5–15.5)
WBC: 6.6 10*3/uL (ref 4.0–10.5)

## 2013-06-13 MED ORDER — CLONIDINE HCL 0.1 MG PO TABS
0.1000 mg | ORAL_TABLET | ORAL | Status: DC | PRN
  Administered 2013-06-13 – 2013-06-15 (×3): 0.1 mg via ORAL
  Filled 2013-06-13 (×3): qty 1

## 2013-06-13 MED ORDER — SPIRONOLACTONE 25 MG PO TABS
12.5000 mg | ORAL_TABLET | Freq: Every day | ORAL | Status: DC
  Administered 2013-06-13 – 2013-06-14 (×2): 12.5 mg via ORAL
  Filled 2013-06-13 (×2): qty 1

## 2013-06-13 MED ORDER — POTASSIUM CHLORIDE CRYS ER 20 MEQ PO TBCR
40.0000 meq | EXTENDED_RELEASE_TABLET | Freq: Once | ORAL | Status: AC
  Administered 2013-06-13: 40 meq via ORAL
  Filled 2013-06-13: qty 2

## 2013-06-13 NOTE — Progress Notes (Signed)
SUBJECTIVE: Complaints of severe headache and nausea and vomiting overnight. No palpitations or racing heart rate.   Principal Problem:   Tachycardia Active Problems:   DIABETES MELLITUS, TYPE II, UNCONTROLLED   HYPERLIPIDEMIA   ANXIETY   HYPERTENSION   CORONARY ATHEROSCLEROSIS NATIVE CORONARY ARTERY   Elevated transaminase level   Leg weakness, bilateral   Hypomagnesemia   PSVT (paroxysmal supraventricular tachycardia)   LABS: Basic Metabolic Panel:  Recent Labs  06/12/13 0403 06/13/13 0514  NA 139 134*  K 3.5 3.4*  CL 104 98  CO2 26 28  GLUCOSE 188* 192*  BUN 11 8  CREATININE 0.44* 0.40*  CALCIUM 9.2 9.7  MG 1.4*  --    Liver Function Tests:  Recent Labs  06/12/13 0403  AST 48*  ALT 47*  ALKPHOS 298*  BILITOT 0.6  PROT 7.3  ALBUMIN 3.4*   CBC:  Recent Labs  06/12/13 0403 06/13/13 0514  WBC 6.6 6.6  HGB 11.9* 13.0  HCT 35.1* 38.1  MCV 95.4 95.3  PLT 350 393   Cardiac Enzymes:  Recent Labs  06/12/13 0403 06/12/13 1304 06/12/13 1905  TROPONINI <0.30 <0.30 <0.30   _0 @ D-Dimer:  Recent Labs  06/12/13 1304  DDIMER 0.29   Thyroid Function Tests:  Recent Labs  06/12/13 0850  TSH 1.086    RADIOLOGY: Ct Head Wo Contrast - If Head Trauma Is Known/suspected And/or Pt Is Taking Anticoagulant  06/12/2013   *RADIOLOGY REPORT*  Clinical Data: Tachycardia.  Lower extremity weakness.  CT HEAD WITHOUT CONTRAST  Technique:  Contiguous axial images were obtained from the base of the skull through the vertex without contrast.  Comparison: 03/28/2010.  Findings: No mass lesion, mass effect, midline shift, hydrocephalus, hemorrhage.  No acute territorial cortical ischemia/infarct. Atrophy and chronic ischemic white matter disease is present.  Calcified scalp lesion in the right frontal region. Vertebral basilar dolichoectasia. Mastoid air cells are within normal limits.  Paranasal sinuses normal.  Calvarium intact.  IMPRESSION: Atrophy and chronic  ischemic white matter disease without acute intracranial abnormality.   Original Report Authenticated By: Dereck Ligas, M.D.   Mr [facility] Wo Contrast  06/12/2013   *RADIOLOGY REPORT*  Clinical Data: Leg weakness.  Rule out basilar stenosis  MRA HEAD WITHOUT CONTRAST IMPRESSION: Occlusion of the right internal carotid artery.  The right anterior and middle cerebral arteries are supplied via the right posterior communicating artery.  The left internal carotid artery shows decreased flow and decreased caliber due to slow flow suggesting a severe proximal internal carotid artery stenosis in the neck. Carotid Doppler suggest left internal carotid artery occlusion.  Consider CTA of the head and neck for further evaluation to determine severe stenosis versus occlusion.  In addition, there appears to be atherosclerotic disease throughout the left M1 segment.  Widely patent posterior circulation.   Original Report Authenticated By: Carl Best, M.D.   Mr Jeri Cos FB Contrast  06/12/2013   *RADIOLOGY REPORT*  Clinical Data: Leg weakness.  Hypertension diabetes and hyperlipidemia  MRI HEAD WITHOUT AND WITH CONTRAST   IMPRESSION: Atrophy and chronic microvascular ischemia, asymmetric on the left suggestive of chronic MCA ischemia.  Mass lesion in the right posterior parotid gland, enlarged in 2007. 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   US Carotid Duplex Bilateral  06/12/2013   *RADIOLOGY REPORT*  Clinical Data: TIA symptoms  BILATERAL CAROTID DUPLEX ULTRASOUND  Technique: Pearline Cables scale imaging, color Doppler and duplex ultrasound were performed of bilateral carotid  and vertebral arteries in the neck.   Findings:  RIGHT CAROTID ARTERY: The internal carotid artery is occluded just beyond its origin  RIGHT VERTEBRAL ARTERY:  Antegrade  LEFT CAROTID ARTERY: The internal carotid artery is occluded just beyond its origin.  LEFT VERTEBRAL ARTERY:  Antegrade  IMPRESSION: Bilateral  internal carotid artery occlusion.   Original Report Authenticated By: Inez Catalina, M.D.   Dg Chest Port 1 View  06/12/2013   *RADIOLOGY REPORT*  Clinical Data: Lower extremity weakness.  PORTABLE CHEST - 1 VIEW  IMPRESSION:  1.  Cardiomegaly and pulmonary venous congestion. 2.  Ongoing or recurrent right base infiltrate or atelectasis. 3.  COPD.   Original Report Authenticated By: Jorje Guild     PHYSICAL EXAM BP 165/72  Pulse 86  Temp(Src) 98.4 F (36.9 C) (Oral)  Resp 20  Ht 5' 3" (1.6 m)  Wt 136 lb 11 oz (62 kg)  BMI 24.22 kg/m2  SpO2 96% General: Well developed, well nourished, in no acute distress Head: Eyes PERRLA, No xanthomas.   Normal cephalic and atraumatic.   Lungs: Clear bilaterally to auscultation and percussion. Heart: HRRR S1 S2, No MRG .  Pulses are 2+ & equal.            No carotid bruit. No JVD.  No abdominal bruits.  Abdomen: Bowel sounds are positive, abdomen soft and non-tender without masses or                  Hernia's noted. Msk:  Back normal, normal gait. Diminished strength and tone for age. Extremities: No clubbing, cyanosis or edema.  DP +1 Neuro: Alert and oriented X 3. Psych:  Flat, listless, responds appropriately  TELEMETRY: Reviewed telemetry pt in NSR rate of 88 bpm.  ASSESSMENT AND PLAN:  1. PSVT, possibly AVRT: Converted to NSR with occasional PVC's but rare overnight.  She was placed on low dose metoprolol 25 mg daily and has tolerated this well without evidence of bradycardia or hypotension. No further cardiac testing is planned at present.  2. Carotid artery disease: Bilateral ICA occlusions noted per CT scan. She is having severe headaches with associated NV. Continue ASA and statin. Doubt she is a surgical candidate.  3. FTT: Daughter has become quite concerned about her declining status. She has not yet recovered from recent appendectomy with subsequent ileus last month. She has lost 20 lbs and does not appear to be getting her  appetite back. Will defer to PCP for ongoing treatment.    Phill Myron. Purcell Nails NP [facility] 06/13/2013, 9:02 AM  The patient was seen and examined, and I agree with the assessment and plan as documented above. She is currently in normal sinus rhythm on low-dose Metoprolol, and I would continue this. She is hypertensive and had evidence of mild pulmonary vascular congestion by x-ray yesterday. She was also hypokalemic. Her creatinine is normal. I will add low-dose Spironolactone, as I would want to avoid an acute diastolic decompensation (known to have grade I diastolic dysfunction my echocardiogram in April 2014).

## 2013-06-13 NOTE — Care Management Note (Signed)
    Page 1 of 2   06/15/2013     8:55:46 AM   CARE MANAGEMENT NOTE 06/15/2013  Patient:  [name], [name]   Account Number:  [id number]  Date Initiated:  06/13/2013  Documentation initiated by:  Claretha Cooper  Subjective/Objective Assessment:   Pt admitted from home where she lives with her children. Currently active with [facility] RN and PT. PT has been requested to work with lower extremities as well as upper.     Action/Plan:   Anticipated DC Date:  06/14/2013   Anticipated DC Plan:  [place]  CM consult      [facility] Choice  Resumption Of Svcs/PTA Provider   Choice offered to / List presented to:  C-1 Patient        Ken Caryl arranged  HH-1 RN  [place] [facility]      [place].   Status of service:  Completed, signed off Medicare Important Message given?  YES (If response is "NO", the following Medicare IM given date fields will be blank) Date Medicare IM given:  06/15/2013 Date Additional Medicare IM given:    Discharge Disposition:  [place]  Per UR Regulation:    If discussed at Long Length of Stay Meetings, dates discussed:    Comments:  06/15/13 [place], RN BSN CM Pt discharged home today with [facility] Rn and PT. Romualdo Bolk of [facility] is aware and will collect the pts information from the chart. No DME needs noted. [place] services to start within 48 hours. Pt and pts nurse aware of discharge arrangements.  06/13/13 Claretha Cooper RN BSN CM

## 2013-06-13 NOTE — Progress Notes (Signed)
Last night, patient had a rough night. Patient complained of a bad headache and her legs being restless. Vicodin helped bring the headache down and get her to sleep. Then this morning, patient started vomiting up a small amount of yellow emesis. Patient given zofran and nurse will continue to monitor.

## 2013-06-13 NOTE — Progress Notes (Signed)
Subjective: Patient was admitted yesterday due to tachyarrhythmia and weakness especially on the LT side. She feels dizzy.  Objective: Vital signs in last 24 hours: Temp:  [98 F (36.7 C)-98.7 F (37.1 C)] 98.4 F (36.9 C) (07/29 0528) Pulse Rate:  [73-86] 86 (07/29 0538) Resp:  [18-20] 20 (07/29 0538) BP: (117-199)/(63-82) 165/72 mmHg (07/29 0610) SpO2:  [93 %-100 %] 93 % (07/29 0538) Weight:  [62 kg (136 lb 11 oz)] 62 kg (136 lb 11 oz) (07/28 3329) Weight change:  Last BM Date: 06/11/13  Intake/Output from previous day: 07/28 0701 - 07/29 0700 In: 360 [P.O.:360] Out: -   PHYSICAL EXAM General appearance: alert and no distress Resp: clear to auscultation bilaterally Cardio: S1, S2 normal GI: soft, non-tender; bowel sounds normal; no masses,  no organomegaly Extremities: extremities normal, atraumatic, no cyanosis or edema  Lab Results:    _0 @ ABGS No results found for this basename: PHART, PCO2, PO2ART, TCO2, HCO3,  in the last 72 hours CULTURES No results found for this or any previous visit (from the past 240 hour(s)). Studies/Results: Ct Head Wo Contrast - If Head Trauma Is Known/suspected And/or Pt Is Taking Anticoagulant  06/12/2013   *RADIOLOGY REPORT*  Clinical Data: Tachycardia.  Lower extremity weakness.  CT HEAD WITHOUT CONTRAST  Technique:  Contiguous axial images were obtained from the base of the skull through the vertex without contrast.  Comparison: 03/28/2010.  Findings: No mass lesion, mass effect, midline shift, hydrocephalus, hemorrhage.  No acute territorial cortical ischemia/infarct. Atrophy and chronic ischemic white matter disease is present.  Calcified scalp lesion in the right frontal region. Vertebral basilar dolichoectasia. Mastoid air cells are within normal limits.  Paranasal sinuses normal.  Calvarium intact.  IMPRESSION: Atrophy and chronic ischemic white matter disease without acute intracranial abnormality.   Original Report  Authenticated By: Dereck Ligas, M.D.   Mr [facility] Wo Contrast  06/12/2013   *RADIOLOGY REPORT*  Clinical Data: Leg weakness.  Rule out basilar stenosis  MRA HEAD WITHOUT CONTRAST  Technique: Angiographic images of the Circle of Willis were obtained using MRA technique without intravenous contrast.  Comparison: MRI brain today.  CT head 06/04/2013  Findings: Both vertebral arteries are widely patent.  Basilar artery is widely patent.  Vertebral arteries are similar in size of relatively large in size to severe anterior circulation disease. The posterior cerebral arteries are patent bilaterally.  The right internal carotid artery is occluded through the cavernous segment and reconstitutes via of the right posterior communicating artery which is a prominent vessel.  The anterior and middle cerebral arteries are patent on the right.  The left internal carotid artery is probably patent but appears small with low flow.  This suggests a severe stenosis of the left internal carotid artery in the neck.  The left posterior communicator artery is patent and supplies the anterior circulation.  The left middle cerebral artery is small due to diffuse disease.  There is decreased perfusion of left middle cerebral artery territory.  There is a prominent left ophthalmic artery which is most likely a collateral source of circulation to the left internal carotid artery.  Negative for aneurysm.  IMPRESSION: Occlusion of the right internal carotid artery.  The right anterior and middle cerebral arteries are supplied via the right posterior communicating artery.  The left internal carotid artery shows decreased flow and decreased caliber due to slow flow suggesting a severe proximal internal carotid artery stenosis in the neck. Carotid Doppler suggest left internal carotid artery occlusion.  Consider CTA of the head and neck for further evaluation to determine severe stenosis versus occlusion.  In addition, there appears to be  atherosclerotic disease throughout the left M1 segment.  Widely patent posterior circulation.   Original Report Authenticated By: Carl Best, M.D.   Mr Jeri Cos KZ Contrast  06/12/2013   *RADIOLOGY REPORT*  Clinical Data: Leg weakness.  Hypertension diabetes and hyperlipidemia  MRI HEAD WITHOUT AND WITH CONTRAST  Technique:  Multiplanar, multiecho pulse sequences of the brain and surrounding structures were obtained according to standard protocol without and with intravenous contrast  Contrast: 73m MULTIHANCE GADOBENATE DIMEGLUMINE 529 MG/ML IV SOLN  Comparison: CT 06/12/2013  Findings: Negative for acute infarct.  Chronic microvascular ischemic changes are present, asymmetric on the left.  Small chronic infarct left frontal lobe.  Brainstem and cerebellum are negative.  Negative for intracranial hemorrhage or mass lesion.  Age appropriate atrophy.  Negative for hydrocephalus.  Soft tissue mass in the right posterior parotid gland measures 16 x 30 mm on sagittal images.  This appears larger compared with 2007 MRI of the neck which also identified this lesion.  Postcontrast imaging reveals normal enhancement.  No intracranial mass lesion is identified.  IMPRESSION: Atrophy and chronic microvascular ischemia, asymmetric on the left suggestive of chronic MCA ischemia.  Mass lesion in the right posterior parotid gland, enlarged in 2007. 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   [place] Duplex Bilateral  06/12/2013   *RADIOLOGY REPORT*  Clinical Data: TIA symptoms  BILATERAL CAROTID DUPLEX ULTRASOUND  Technique: GPearline Cablesscale imaging, color Doppler and duplex ultrasound were performed of bilateral carotid and vertebral arteries in the neck.  Comparison:  None.  Criteria:  Quantification of carotid stenosis is based on velocity parameters that correlate the residual internal carotid diameter with NASCET-based stenosis levels, using the diameter of the distal internal carotid  lumen as the denominator for stenosis measurement.  The following velocity measurements were obtained:                   PEAK SYSTOLIC/END DIASTOLIC RIGHT ICA:                        Occludedcm/sec CCA:                        360/1UX/NATSYSTOLIC ICA/CCA RATIO:     Not applicable DIASTOLIC ICA/CCA RATIO:    Not applicable ECA:                        71cm/sec  LEFT ICA:                        Occludedcm/sec CCA:                        355/7DU/KGUSYSTOLIC ICA/CCA RATIO:     Not applicable DIASTOLIC ICA/CCA RATIO:    Not applicable ECA:                        61cm/sec  Findings:  RIGHT CAROTID ARTERY: The internal carotid artery is occluded just beyond its origin  RIGHT VERTEBRAL ARTERY:  Antegrade  LEFT CAROTID ARTERY: The internal carotid artery is occluded just beyond its origin.  LEFT VERTEBRAL ARTERY:  Antegrade  IMPRESSION: Bilateral internal carotid artery occlusion.   Original Report  Authenticated By: Inez Catalina, M.D.   Dg Chest Port 1 View  06/12/2013   *RADIOLOGY REPORT*  Clinical Data: Lower extremity weakness.  PORTABLE CHEST - 1 VIEW  Comparison: 05/30/2013.  Findings: Chronic cardiomegaly.  Aortic atherosclerosis.  No change in heart size and mediastinal contours.  Hyperinflated lungs.  Persistent asymmetric density in the medial right base ( this findings seen dating back to 2011).  There is streaky opacity in the retrocardiac lung.  Small pleural effusions may persist.  No pneumothorax.  No acute osseous findings.  IMPRESSION:  1.  Cardiomegaly and pulmonary venous congestion. 2.  Ongoing or recurrent right base infiltrate or atelectasis. 3.  COPD.   Original Report Authenticated By: Jorje Guild    Medications: I have reviewed the patient's current medications.  Assesment: Principal Problem:   Tachycardia Active Problems:   DIABETES MELLITUS, TYPE II, UNCONTROLLED   HYPERLIPIDEMIA   ANXIETY   HYPERTENSION   CORONARY ATHEROSCLEROSIS NATIVE CORONARY ARTERY   Elevated transaminase  level   Leg weakness, bilateral   Hypomagnesemia   PSVT (paroxysmal supraventricular tachycardia) Bilateral carotid artery disease   Plan: Medications reviewed Cardiology and neurology consult appreciated Will do physical therapy evaluation.    LOS: 1 day   Mike Berntsen 06/13/2013, 7:48 AM

## 2013-06-13 NOTE — Progress Notes (Signed)
Physical Therapy Treatment Patient Details Name: ETHAL GOTAY MRN: [id number] DOB: [birth year] Today's Date: 06/13/2013 Time: 0920-0957 PT Time Calculation (min): 37 min  PT Assessment / Plan / Recommendation  History of Present Illness     Clinical Impression Pt appears to be somewhat weaker today and has more difficulty focusing on task.  Gait and transfers are much more labored.  For this reason, I am recommending HHPT at d/c.   PT Comments     Follow Up Recommendations  Home health PT     Does the patient have the potential to tolerate intense rehabilitation     Barriers to Discharge        Equipment Recommendations  None recommended by PT    Recommendations for Other Services    Frequency     Progress towards PT Goals Progress towards PT goals: Not progressing toward goals - comment (generally weaker today)  Plan Discharge plan needs to be updated    Precautions / Restrictions     Pertinent Vitals/Pain     Mobility  Bed Mobility Supine to Sit: 4: Min guard;HOB flat Details for Bed Mobility Assistance: transfer is extremely slow and labored, needing cues as to technique Transfers Sit to Stand: 4: Min guard;From bed Details for Transfer Assistance: transfer much more labored today..it took 2 attempts at standing from the bed before she was successful Ambulation/Gait Ambulation/Gait Assistance: 4: Min assist Ambulation Distance (Feet): 130 Feet Assistive device: Rolling walker Ambulation/Gait Assistance Details: cues needed for looking ahead in order to avoid obstacles, to keep head erect (she holds head in full flexion) Gait Pattern: Narrow base of support;Decreased hip/knee flexion - left;Decreased hip/knee flexion - right Gait velocity: very slow and labored with frequent stops...appears to be in a daze at times Stairs: No Wheelchair Mobility Wheelchair Mobility: No    Exercises General Exercises - Upper Extremity Shoulder Flexion: AAROM;Both;5  reps;Supine General Exercises - Lower Extremity Ankle Circles/Pumps: AROM;Both;10 reps;Supine Quad Sets: AROM;Both;10 reps;Supine Gluteal Sets: AROM;Both;10 reps;Supine Short Arc Quad: AROM;Both;10 reps;Supine Heel Slides: AROM;Both;10 reps;Supine Hip ABduction/ADduction: AROM;Both;10 reps;Supine   PT Diagnosis:    PT Problem List:   PT Treatment Interventions:     PT Goals (current goals can now be found in the care plan section)    Visit Information  Last PT Received On: 06/13/13    Subjective Data      Cognition  Cognition Arousal/Alertness: Awake/alert Behavior During Therapy: [facility] for tasks assessed/performed Overall Cognitive Status: Within Functional Limits for tasks assessed    Balance     End of Session PT - End of Session Equipment Utilized During Treatment: Gait belt Activity Tolerance: Patient limited by fatigue Patient left: in chair;with call bell/phone within reach;with chair alarm set;with family/visitor present Nurse Communication: Mobility status   GP     Demetrios Isaacs L 06/13/2013, 10:05 AM

## 2013-06-14 DIAGNOSIS — I498 Other specified cardiac arrhythmias: Secondary | ICD-10-CM | POA: Diagnosis not present

## 2013-06-14 DIAGNOSIS — E119 Type 2 diabetes mellitus without complications: Secondary | ICD-10-CM | POA: Diagnosis not present

## 2013-06-14 DIAGNOSIS — G609 Hereditary and idiopathic neuropathy, unspecified: Secondary | ICD-10-CM | POA: Diagnosis not present

## 2013-06-14 DIAGNOSIS — R269 Unspecified abnormalities of gait and mobility: Secondary | ICD-10-CM | POA: Diagnosis not present

## 2013-06-14 DIAGNOSIS — I2510 Atherosclerotic heart disease of native coronary artery without angina pectoris: Secondary | ICD-10-CM | POA: Diagnosis not present

## 2013-06-14 DIAGNOSIS — G459 Transient cerebral ischemic attack, unspecified: Secondary | ICD-10-CM | POA: Diagnosis not present

## 2013-06-14 DIAGNOSIS — I951 Orthostatic hypotension: Secondary | ICD-10-CM | POA: Diagnosis not present

## 2013-06-14 LAB — GLUCOSE, CAPILLARY
Glucose-Capillary: 164 mg/dL — ABNORMAL HIGH (ref 70–99)
Glucose-Capillary: 99 mg/dL (ref 70–99)
Glucose-Capillary: 169 mg/dL — ABNORMAL HIGH (ref 70–99)
Glucose-Capillary: 130 mg/dL — ABNORMAL HIGH (ref 70–99)

## 2013-06-14 LAB — BASIC METABOLIC PANEL
BUN: 12 mg/dL (ref 6–23)
CO2: 26 mEq/L (ref 19–32)
Calcium: 9.7 mg/dL (ref 8.4–10.5)
Chloride: 100 mEq/L (ref 96–112)
Creatinine, Ser: 0.54 mg/dL (ref 0.50–1.10)
GFR calc Af Amer: >90 mL/min (ref >90)
GFR calc non Af Amer: 87 mL/min — ABNORMAL LOW (ref >90)
Glucose, Bld: 188 mg/dL — ABNORMAL HIGH (ref 70–99)
Potassium: 4.4 mEq/L (ref 3.5–5.1)
Sodium: 135 mEq/L (ref 135–145)

## 2013-06-14 LAB — HEPATIC FUNCTION PANEL
ALT: 81 U/L — ABNORMAL HIGH (ref 0–35)
AST: 89 U/L — ABNORMAL HIGH (ref 0–37)
Albumin: 3.6 g/dL (ref 3.5–5.2)
Alkaline Phosphatase: 392 U/L — ABNORMAL HIGH (ref 39–117)
Bilirubin, Direct: 0.2 mg/dL (ref 0.0–0.3)
Indirect Bilirubin: 0.7 mg/dL (ref 0.3–0.9)
Total Bilirubin: 0.9 mg/dL (ref 0.3–1.2)
Total Protein: 7.9 g/dL (ref 6.0–8.3)

## 2013-06-14 MED ORDER — MIDODRINE HCL 5 MG PO TABS
5.0000 mg | ORAL_TABLET | Freq: Three times a day (TID) | ORAL | Status: DC
  Administered 2013-06-14 (×2): 5 mg via ORAL
  Filled 2013-06-14 (×7): qty 1

## 2013-06-14 MED ORDER — DOCUSATE SODIUM 100 MG PO CAPS
100.0000 mg | ORAL_CAPSULE | Freq: Two times a day (BID) | ORAL | Status: DC
  Administered 2013-06-14 (×2): 100 mg via ORAL
  Filled 2013-06-14 (×2): qty 1

## 2013-06-14 MED ORDER — DIPHENHYDRAMINE HCL 25 MG PO CAPS: 25.0000 mg | ORAL_CAPSULE | Freq: Four times a day (QID) | ORAL | Status: DC | PRN

## 2013-06-14 MED ORDER — MIRTAZAPINE 15 MG PO TBDP
15.0000 mg | ORAL_TABLET | Freq: Every day | ORAL | Status: DC
  Administered 2013-06-14: 15 mg via ORAL
  Filled 2013-06-14 (×2): qty 1

## 2013-06-14 MED ORDER — CYANOCOBALAMIN 1000 MCG/ML IJ SOLN
1000.0000 ug | Freq: Once | INTRAMUSCULAR | Status: AC
  Administered 2013-06-14: 1000 ug via INTRAMUSCULAR
  Filled 2013-06-14: qty 1

## 2013-06-14 NOTE — Progress Notes (Signed)
Physical Therapy Treatment Patient Details Name: [name] MRN: [id number] DOB: March 05, 1931 Today's Date: 06/14/2013 Time: 1010-1030 PT Time Calculation (min): 20 min  PT Assessment / Plan / Recommendation     PT Comments   Pt is pleasant and cooperative. Gait quality has improved since last session. Pt appears steady in standing. Began bed exercises but after third exercise pt became too fatigued to continue. Pt appears to be progressing well. All activities appear less labored for pt.  Plan  (Continue per PT POC.)    Precautions / Restrictions Restrictions Weight Bearing Restrictions: No   Pertinent Vitals/Pain 0/10    Mobility  Bed Mobility Bed Mobility: Supine to Sit Supine to Sit: 4: Min guard;HOB elevated Transfers Transfers: Sit to Stand;Stand to Sit Sit to Stand: 4: Min guard;From bed;With upper extremity assist Stand to Sit: 4: Min guard;To bed;Without upper extremity assist Details for Transfer Assistance: Pt completes transfers with increased ease. Ambulation/Gait Ambulation/Gait Assistance: 4: Min guard Ambulation Distance (Feet): 70 Feet Assistive device: Rolling walker Ambulation/Gait Assistance Details: VCs required to avoid looking down Gait Pattern: Narrow base of support;Decreased hip/knee flexion - left;Decreased hip/knee flexion - right;Decreased stride length Gait velocity: Cautious but not as labored. No stops.    Exercises General Exercises - Lower Extremity Ankle Circles/Pumps: AROM;Both;10 reps;Supine Heel Slides: AROM;Both;10 reps;Supine Hip ABduction/ADduction: AROM;Both;10 reps;Supine      Visit Information  Last PT Received On: 06/14/13    Subjective Data  Pt states that she is feeling better today than yesterday.   End of Session PT - End of Session Equipment Utilized During Treatment: Gait belt Activity Tolerance: Patient tolerated treatment well Patient left: in bed;with call bell/phone within reach;with bed alarm set;with  family/visitor present   Rachelle Hora, PTA  06/14/2013, 10:40 AM

## 2013-06-14 NOTE — Progress Notes (Signed)
Subjective: Patient remained weak and has difficulty to ambulate. She is complaining of intermittent episode of headache. She is being evaluated by neurology. He po intake remained very poor. She feels depressed.  Objective: Vital signs in last 24 hours: Temp:  [97.9 F (36.6 C)-98.3 F (36.8 C)] 97.9 F (36.6 C) (07/30 0623) Pulse Rate:  [68-108] 99 (07/30 0637) Resp:  [20-22] 20 (07/30 0637) BP: (96-187)/(49-88) 96/64 mmHg (07/30 0637) SpO2:  [96 %-98 %] 97 % (07/30 0734) Weight change:  Last BM Date: 06/11/13  Intake/Output from previous day: 07/29 0701 - 07/30 0700 In: 960 [P.O.:960] Out: -   PHYSICAL EXAM General appearance: alert and no distress Resp: clear to auscultation bilaterally Cardio: S1, S2 normal GI: soft, non-tender; bowel sounds normal; no masses,  no organomegaly Extremities: extremities normal, atraumatic, no cyanosis or edema  Lab Results:    _0 @ [facility] No results found for this basename: PHART, PCO2, PO2ART, TCO2, HCO3,  in the last 72 hours CULTURES No results found for this or any previous visit (from the past 240 hour(s)). Studies/Results: Mr Virgel Paling RA Contrast  06/12/2013   *RADIOLOGY REPORT*  Clinical Data: Leg weakness.  Rule out basilar stenosis  MRA HEAD WITHOUT CONTRAST  Technique: Angiographic images of the Circle of Willis were obtained using MRA technique without intravenous contrast.  Comparison: MRI brain today.  CT head 06/04/2013  Findings: Both vertebral arteries are widely patent.  Basilar artery is widely patent.  Vertebral arteries are similar in size of relatively large in size to severe anterior circulation disease. The posterior cerebral arteries are patent bilaterally.  The right internal carotid artery is occluded through the cavernous segment and reconstitutes via of the right posterior communicating artery which is a prominent vessel.  The anterior and middle cerebral arteries are patent on the right.  The left internal  carotid artery is probably patent but appears small with low flow.  This suggests a severe stenosis of the left internal carotid artery in the neck.  The left posterior communicator artery is patent and supplies the anterior circulation.  The left middle cerebral artery is small due to diffuse disease.  There is decreased perfusion of left middle cerebral artery territory.  There is a prominent left ophthalmic artery which is most likely a collateral source of circulation to the left internal carotid artery.  Negative for aneurysm.  IMPRESSION: Occlusion of the right internal carotid artery.  The right anterior and middle cerebral arteries are supplied via the right posterior communicating artery.  The left internal carotid artery shows decreased flow and decreased caliber due to slow flow suggesting a severe proximal internal carotid artery stenosis in the neck. Carotid Doppler suggest left internal carotid artery occlusion.  Consider CTA of the head and neck for further evaluation to determine severe stenosis versus occlusion.  In addition, there appears to be atherosclerotic disease throughout the left M1 segment.  Widely patent posterior circulation.   Original Report Authenticated By: Carl Best, M.D.   Mr Jeri Cos QT Contrast  06/12/2013   *RADIOLOGY REPORT*  Clinical Data: Leg weakness.  Hypertension diabetes and hyperlipidemia  MRI HEAD WITHOUT AND WITH CONTRAST  Technique:  Multiplanar, multiecho pulse sequences of the brain and surrounding structures were obtained according to standard protocol without and with intravenous contrast  Contrast: 20m MULTIHANCE GADOBENATE DIMEGLUMINE 529 MG/ML IV SOLN  Comparison: CT 06/12/2013  Findings: Negative for acute infarct.  Chronic microvascular ischemic changes are present, asymmetric on the left.  Small chronic infarct  left frontal lobe.  Brainstem and cerebellum are negative.  Negative for intracranial hemorrhage or mass lesion.  Age appropriate atrophy.   Negative for hydrocephalus.  Soft tissue mass in the right posterior parotid gland measures 16 x 30 mm on sagittal images.  This appears larger compared with 2007 MRI of the neck which also identified this lesion.  Postcontrast imaging reveals normal enhancement.  No intracranial mass lesion is identified.  IMPRESSION: Atrophy and chronic microvascular ischemia, asymmetric on the left suggestive of chronic MCA ischemia.  Mass lesion in the right posterior parotid gland, enlarged in 2007. 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   US Carotid Duplex Bilateral  06/12/2013   *RADIOLOGY REPORT*  Clinical Data: TIA symptoms  BILATERAL CAROTID DUPLEX ULTRASOUND  Technique: Pearline Cables scale imaging, color Doppler and duplex ultrasound were performed of bilateral carotid and vertebral arteries in the neck.  Comparison:  None.  Criteria:  Quantification of carotid stenosis is based on velocity parameters that correlate the residual internal carotid diameter with NASCET-based stenosis levels, using the diameter of the distal internal carotid lumen as the denominator for stenosis measurement.  The following velocity measurements were obtained:                   PEAK SYSTOLIC/END DIASTOLIC RIGHT ICA:                        Occludedcm/sec CCA:                        60/7PX/TGG SYSTOLIC ICA/CCA RATIO:     Not applicable DIASTOLIC ICA/CCA RATIO:    Not applicable ECA:                        71cm/sec  LEFT ICA:                        Occludedcm/sec CCA:                        26/9SW/NIO SYSTOLIC ICA/CCA RATIO:     Not applicable DIASTOLIC ICA/CCA RATIO:    Not applicable ECA:                        61cm/sec  Findings:  RIGHT CAROTID ARTERY: The internal carotid artery is occluded just beyond its origin  RIGHT VERTEBRAL ARTERY:  Antegrade  LEFT CAROTID ARTERY: The internal carotid artery is occluded just beyond its origin.  LEFT VERTEBRAL ARTERY:  Antegrade  IMPRESSION: Bilateral internal carotid  artery occlusion.   Original Report Authenticated By: Inez Catalina, M.D.    Medications: I have reviewed the patient's current medications.  Assesment: Principal Problem:   Tachycardia Active Problems:   DIABETES MELLITUS, TYPE II, UNCONTROLLED   HYPERLIPIDEMIA   ANXIETY   HYPERTENSION   CORONARY ATHEROSCLEROSIS NATIVE CORONARY ARTERY   Elevated transaminase level   Leg weakness, bilateral   Hypomagnesemia   PSVT (paroxysmal supraventricular tachycardia) Bilateral carotid artery disease Weight loss   Plan: Medications reviewed Continue physical therapy. Will start on Remeron 15 mg po qhs for appetite and depression symptoms    LOS: 2 days   Filmore Molyneux 06/14/2013, 8:07 AM

## 2013-06-14 NOTE — Progress Notes (Signed)
Patient ID: [name], female   DOB: Mar 26, 1931, 79 y.o.   MRN: [id number]  Lawson A. Merlene Laughter, MD     [email or web address]          [name] is an 79 y.o. female.   Assessment/Plan: 1.  The patient essentially has severe extracranial occlusive disease with occluded bilateral carotids with the posterior circulation taken over. The patient's symptoms is further aggravated by marked orthostatic hypotension which I suspect is likely due to diabetic polyneuropathy/diabetic dysautonomia. The combination of these 2 problems poses a significant risk to the patient. The patient therefore seems to be having ischemic events when she stands up to move around. The patient will be started on midodrine and support stockings will also be added. She is to continue with aspirin therapy and lipid-lowering agents. The findings are discussed at length with the family.  2. Headaches possibly related to fluctuations in blood pressure.  The patient seemed to do okay as long as she is in bed. However, when she gets up to go to the restroom she developed her symptoms of weakness of the legs and upper extremities. She did have a spell of significant headache on yesterday evening and overnight relieved with the appropriate analgesics but associated with elevated systolic blood pressure 976B. The patient's orthostatics are reviewed and she is quite orthostatic and every single time it is checked.   GENERAL: This is a thin lady who is in no acute distress.  HEENT: Unremarkable.  ABDOMEN: soft  EXTREMITIES: No edema   BACK: Unremarkable.  SKIN: Normal by inspection.    MENTAL STATUS: Alert and oriented. Speech, language and cognition are generally intact. Judgment and insight normal.   CRANIAL NERVES: The upper and lower facial muscles are normal in strength and symmetric, there is no flattening of the nasolabial folds; tongue is midline.  MOTOR: Normal tone, bulk and strength; no  pronator drift.  COORDINATION: Left finger to nose is normal, right finger to nose is normal, No rest tremor; no intention tremor; no postural tremor; no bradykinesia.     Objective: Vital signs in last 24 hours: LYING 153/69 HR 80 STANDING 117/66 HR 77  LYING 199/78 HR 73 STANDING 131/77 HR 86  LYING 142/49 HR 74 STANDING 96/64 HR 99  Temp:  [97.9 F (36.6 C)-98.3 F (36.8 C)] 97.9 F (36.6 C) (07/30 0623) Pulse Rate:  [68-108] 99 (07/30 0637) Resp:  [20-22] 20 (07/30 0637) BP: (96-187)/(49-88) 96/64 mmHg (07/30 0637) SpO2:  [96 %-98 %] 97 % (07/30 0637)  Intake/Output from previous day: 07/29 0701 - 07/30 0700 In: 960 [P.O.:960] Out: -  Intake/Output this shift:   Nutritional status: Carb Control   Lab Results: Results for orders placed during the hospital encounter of 06/12/13 (from the past 48 hour(s))  TSH     Status: None   Collection Time    06/12/13  8:50 AM      Result Value Range   TSH 1.086  0.350 - 4.500 uIU/mL  VITAMIN B12     Status: None   Collection Time    06/12/13  8:50 AM      Result Value Range   Vitamin B-12 230  211 - 911 pg/mL  RPR     Status: None   Collection Time    06/12/13  8:50 AM      Result Value Range   RPR NON REACTIVE  NON REACTIVE  HOMOCYSTEINE     Status: None   Collection Time  06/12/13  8:50 AM      Result Value Range   Homocysteine 8.1  4.0 - 15.4 umol/L  GLUCOSE, CAPILLARY     Status: Abnormal   Collection Time    06/12/13 11:50 AM      Result Value Range   Glucose-Capillary 179 (*) 70 - 99 mg/dL   Comment 1 Notify RN    TROPONIN I     Status: None   Collection Time    06/12/13  1:04 PM      Result Value Range   Troponin I <0.30  <0.30 ng/mL   Comment:            Due to the release kinetics of cTnI,     a negative result within the first hours     of the onset of symptoms does not rule out     myocardial infarction with certainty.     If myocardial infarction is still suspected,     repeat the test at  appropriate intervals.  PRO B NATRIURETIC PEPTIDE     Status: Abnormal   Collection Time    06/12/13  1:04 PM      Result Value Range   Pro B Natriuretic peptide (BNP) 880.3 (*) 0 - 450 pg/mL  D-DIMER, QUANTITATIVE     Status: None   Collection Time    06/12/13  1:04 PM      Result Value Range   D-Dimer, Quant 0.29  0.00 - 0.48 ug/mL-FEU   Comment:            AT THE INHOUSE ESTABLISHED CUTOFF     VALUE OF 0.48 ug/mL FEU,     THIS ASSAY HAS BEEN DOCUMENTED     IN THE LITERATURE TO HAVE     A SENSITIVITY AND NEGATIVE     PREDICTIVE VALUE OF AT LEAST     98 TO 99%.  THE TEST RESULT     SHOULD BE CORRELATED WITH     AN ASSESSMENT OF THE CLINICAL     PROBABILITY OF DVT / VTE.  GLUCOSE, CAPILLARY     Status: Abnormal   Collection Time    06/12/13  4:26 PM      Result Value Range   Glucose-Capillary 125 (*) 70 - 99 mg/dL  TROPONIN I     Status: None   Collection Time    06/12/13  7:05 PM      Result Value Range   Troponin I <0.30  <0.30 ng/mL   Comment:            Due to the release kinetics of cTnI,     a negative result within the first hours     of the onset of symptoms does not rule out     myocardial infarction with certainty.     If myocardial infarction is still suspected,     repeat the test at appropriate intervals.  GLUCOSE, CAPILLARY     Status: Abnormal   Collection Time    06/12/13  8:47 PM      Result Value Range   Glucose-Capillary 193 (*) 70 - 99 mg/dL  BASIC METABOLIC PANEL     Status: Abnormal   Collection Time    06/13/13  5:14 AM      Result Value Range   Sodium 134 (*) 135 - 145 mEq/L   Potassium 3.4 (*) 3.5 - 5.1 mEq/L   Chloride 98  96 - 112 mEq/L   CO2 28  19 - 32 mEq/L   Glucose, Bld 192 (*) 70 - 99 mg/dL   BUN 8  6 - 23 mg/dL   Creatinine, Ser 0.40 (*) 0.50 - 1.10 mg/dL   Calcium 9.7  8.4 - 10.5 mg/dL   GFR calc non Af Amer >90  >90 mL/min   GFR calc Af Amer >90  >90 mL/min   Comment:            The eGFR has been calculated     using the  CKD EPI equation.     This calculation has not been     validated in all clinical     situations.     eGFR's persistently     <90 mL/min signify     possible Chronic Kidney Disease.  CBC     Status: None   Collection Time    06/13/13  5:14 AM      Result Value Range   WBC 6.6  4.0 - 10.5 K/uL   RBC 4.00  3.87 - 5.11 MIL/uL   Hemoglobin 13.0  12.0 - 15.0 g/dL   HCT 38.1  36.0 - 46.0 %   MCV 95.3  78.0 - 100.0 fL   MCH 32.5  26.0 - 34.0 pg   MCHC 34.1  30.0 - 36.0 g/dL   RDW 14.3  11.5 - 15.5 %   Platelets 393  150 - 400 K/uL  GLUCOSE, CAPILLARY     Status: Abnormal   Collection Time    06/13/13  7:19 AM      Result Value Range   Glucose-Capillary 183 (*) 70 - 99 mg/dL   Comment 1 Notify RN    GLUCOSE, CAPILLARY     Status: Abnormal   Collection Time    06/13/13 12:01 PM      Result Value Range   Glucose-Capillary 117 (*) 70 - 99 mg/dL   Comment 1 Notify RN    GLUCOSE, CAPILLARY     Status: Abnormal   Collection Time    06/13/13  4:28 PM      Result Value Range   Glucose-Capillary 155 (*) 70 - 99 mg/dL   Comment 1 Documented in Chart     Comment 2 Notify RN    GLUCOSE, CAPILLARY     Status: Abnormal   Collection Time    06/13/13  8:55 PM      Result Value Range   Glucose-Capillary 116 (*) 70 - 99 mg/dL   Comment 1 Notify RN    BASIC METABOLIC PANEL     Status: Abnormal   Collection Time    06/14/13  5:16 AM      Result Value Range   Sodium 135  135 - 145 mEq/L   Potassium 4.4  3.5 - 5.1 mEq/L   Comment: DELTA CHECK NOTED   Chloride 100  96 - 112 mEq/L   CO2 26  19 - 32 mEq/L   Glucose, Bld 188 (*) 70 - 99 mg/dL   BUN 12  6 - 23 mg/dL   Creatinine, Ser 0.54  0.50 - 1.10 mg/dL   Calcium 9.7  8.4 - 10.5 mg/dL   GFR calc non Af Amer 87 (*) >90 mL/min   GFR calc Af Amer >90  >90 mL/min   Comment:            The eGFR has been calculated     using the CKD EPI equation.     This calculation has not been  validated in all clinical     situations.     eGFR's  persistently     <90 mL/min signify     possible Chronic Kidney Disease.    Lipid Panel No results found for this basename: CHOL, TRIG, HDL, CHOLHDL, VLDL, LDLCALC,  in the last 72 hours  Studies/Results: Mr Virgel Paling Wo Contrast  06/12/2013   *RADIOLOGY REPORT*  Clinical Data: Leg weakness.  Rule out basilar stenosis  MRA HEAD WITHOUT CONTRAST  Technique: Angiographic images of the Circle of Willis were obtained using MRA technique without intravenous contrast.  Comparison: MRI brain today.  CT head 06/04/2013  Findings: Both vertebral arteries are widely patent.  Basilar artery is widely patent.  Vertebral arteries are similar in size of relatively large in size to severe anterior circulation disease. The posterior cerebral arteries are patent bilaterally.  The right internal carotid artery is occluded through the cavernous segment and reconstitutes via of the right posterior communicating artery which is a prominent vessel.  The anterior and middle cerebral arteries are patent on the right.  The left internal carotid artery is probably patent but appears small with low flow.  This suggests a severe stenosis of the left internal carotid artery in the neck.  The left posterior communicator artery is patent and supplies the anterior circulation.  The left middle cerebral artery is small due to diffuse disease.  There is decreased perfusion of left middle cerebral artery territory.  There is a prominent left ophthalmic artery which is most likely a collateral source of circulation to the left internal carotid artery.  Negative for aneurysm.  IMPRESSION: Occlusion of the right internal carotid artery.  The right anterior and middle cerebral arteries are supplied via the right posterior communicating artery.  The left internal carotid artery shows decreased flow and decreased caliber due to slow flow suggesting a severe proximal internal carotid artery stenosis in the neck. Carotid Doppler suggest left  internal carotid artery occlusion.  Consider CTA of the head and neck for further evaluation to determine severe stenosis versus occlusion.  In addition, there appears to be atherosclerotic disease throughout the left M1 segment.  Widely patent posterior circulation.   Original Report Authenticated By: Carl Best, M.D.   Mr Jeri Cos HY Contrast  06/12/2013   *RADIOLOGY REPORT*  Clinical Data: Leg weakness.  Hypertension diabetes and hyperlipidemia  MRI HEAD WITHOUT AND WITH CONTRAST  Technique:  Multiplanar, multiecho pulse sequences of the brain and surrounding structures were obtained according to standard protocol without and with intravenous contrast  Contrast: 61m MULTIHANCE GADOBENATE DIMEGLUMINE 529 MG/ML IV SOLN  Comparison: CT 06/12/2013  Findings: Negative for acute infarct.  Chronic microvascular ischemic changes are present, asymmetric on the left.  Small chronic infarct left frontal lobe.  Brainstem and cerebellum are negative.  Negative for intracranial hemorrhage or mass lesion.  Age appropriate atrophy.  Negative for hydrocephalus.  Soft tissue mass in the right posterior parotid gland measures 16 x 30 mm on sagittal images.  This appears larger compared with 2007 MRI of the neck which also identified this lesion.  Postcontrast imaging reveals normal enhancement.  No intracranial mass lesion is identified.  IMPRESSION: Atrophy and chronic microvascular ischemia, asymmetric on the left suggestive of chronic MCA ischemia.  Mass lesion in the right posterior parotid gland, enlarged in 2007. 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   [place] Duplex Bilateral  06/12/2013   *RADIOLOGY REPORT*  Clinical Data: TIA  symptoms  BILATERAL CAROTID DUPLEX ULTRASOUND  Technique: Pearline Cables scale imaging, color Doppler and duplex ultrasound were performed of bilateral carotid and vertebral arteries in the neck.  Comparison:  None.  Criteria:  Quantification of carotid stenosis  is based on velocity parameters that correlate the residual internal carotid diameter with NASCET-based stenosis levels, using the diameter of the distal internal carotid lumen as the denominator for stenosis measurement.  The following velocity measurements were obtained:                   PEAK SYSTOLIC/END DIASTOLIC RIGHT ICA:                        Occludedcm/sec CCA:                        30/1SW/FUX SYSTOLIC ICA/CCA RATIO:     Not applicable DIASTOLIC ICA/CCA RATIO:    Not applicable ECA:                        71cm/sec  LEFT ICA:                        Occludedcm/sec CCA:                        32/3FT/DDU SYSTOLIC ICA/CCA RATIO:     Not applicable DIASTOLIC ICA/CCA RATIO:    Not applicable ECA:                        61cm/sec  Findings:  RIGHT CAROTID ARTERY: The internal carotid artery is occluded just beyond its origin  RIGHT VERTEBRAL ARTERY:  Antegrade  LEFT CAROTID ARTERY: The internal carotid artery is occluded just beyond its origin.  LEFT VERTEBRAL ARTERY:  Antegrade  IMPRESSION: Bilateral internal carotid artery occlusion.   Original Report Authenticated By: Inez Catalina, M.D.    Medications:  Scheduled Meds: . aspirin  325 mg Oral Daily  . atorvastatin  10 mg Oral Daily  . enoxaparin (LOVENOX) injection  40 mg Subcutaneous Q24H  . insulin aspart  0-15 Units Subcutaneous TID WC  . insulin glargine  5 Units Subcutaneous QHS  . metoprolol succinate  25 mg Oral Daily  . pantoprazole  40 mg Oral Daily  . sodium chloride  3 mL Intravenous Q12H  . spironolactone  12.5 mg Oral Daily  . tiotropium  18 mcg Inhalation Daily   Continuous Infusions:  PRN Meds:.acetaminophen, acetaminophen, ALPRAZolam, alum & mag hydroxide-simeth, bisacodyl, cloNIDine, docusate sodium, HYDROcodone-acetaminophen, ondansetron (ZOFRAN) IV, ondansetron     LOS: 2 days   Sadye Kiernan A. Merlene Laughter, M.D.  Diplomate, Tax adviser of Psychiatry and Neurology ( Neurology).

## 2013-06-14 NOTE — Clinical Social Work Psychosocial (Signed)
Clinical Social Work Department BRIEF PSYCHOSOCIAL ASSESSMENT 06/14/2013  Patient:  [name], [name]     Account Number:  [id number]     Admit date:  06/12/2013  Clinical Social Worker:  Wyatt Haste  Date/Time:  06/14/2013 08:35 AM  Referred by:  Physician  Date Referred:  06/14/2013 Referred for  SNF Placement   Other Referral:   Interview type:  Patient Other interview type:   and daughter    PSYCHOSOCIAL DATA Living Status:  FAMILY Admitted from facility:   Level of care:   Primary support name:  [name] Primary support relationship to patient:  CHILD, ADULT Degree of support available:   supportive    CURRENT CONCERNS Current Concerns  Post-Acute Placement   Other Concerns:    SOCIAL WORK ASSESSMENT / PLAN CSW met with pt and pt's daughter at bedside. Pt alert and oriented and known to CSW from previous admission. She has 4 children and lives with a daughter and son. Family is very involved and supportive. Pt states she has not been feeling well at home and has not had an appetite. Review of MD note indicates pt feels depressed. CSW addressed this with pt and she said she is not depressed at all, just has not had an appetite ever since her last surgery. MD spoke with CM this morning and recommended SNF. PT evaluated pt and recommendation is for home health. Pt has no intention of going to SNF and states if her children don't want to stay with her, that's fine she will stay by herself. Pt's daughter present in room called [name] whom pt lives with and she agrees they want to bring pt home. Pt has wheelchair, rolling walker, and BSC. [facility] have had [facility] and would like to continue this at d/c. CSW notified CM. They report no further needs for CSW.   Assessment/plan status:  Referral to [employer] Other assessment/ plan:   Information/referral to community resources:   CM for home health    PATIENT'S/FAMILY'S RESPONSE TO PLAN OF CARE: Pt and family  agree that pt will return home when medically stable. CSW will sign off, but can be reconsulted if needed.       [name], [place]

## 2013-06-15 ENCOUNTER — Emergency Department (HOSPITAL_COMMUNITY): Payer: Medicare Other

## 2013-06-15 ENCOUNTER — Emergency Department (HOSPITAL_COMMUNITY)
Admission: EM | Admit: 2013-06-15 | Discharge: 2013-06-15 | Disposition: A | Discharge status: Home / Self Care | Payer: Medicare Other | Attending: Emergency Medicine | Admitting: Emergency Medicine

## 2013-06-15 DIAGNOSIS — Z7982 Long term (current) use of aspirin: Secondary | ICD-10-CM | POA: Insufficient documentation

## 2013-06-15 DIAGNOSIS — Y939 Activity, unspecified: Secondary | ICD-10-CM | POA: Insufficient documentation

## 2013-06-15 DIAGNOSIS — F329 Major depressive disorder, single episode, unspecified: Secondary | ICD-10-CM | POA: Insufficient documentation

## 2013-06-15 DIAGNOSIS — G609 Hereditary and idiopathic neuropathy, unspecified: Secondary | ICD-10-CM | POA: Diagnosis not present

## 2013-06-15 DIAGNOSIS — T1490XA Injury, unspecified, initial encounter: Secondary | ICD-10-CM | POA: Diagnosis not present

## 2013-06-15 DIAGNOSIS — S298XXA Other specified injuries of thorax, initial encounter: Secondary | ICD-10-CM | POA: Diagnosis not present

## 2013-06-15 DIAGNOSIS — Z87891 Personal history of nicotine dependence: Secondary | ICD-10-CM | POA: Insufficient documentation

## 2013-06-15 DIAGNOSIS — W19XXXA Unspecified fall, initial encounter: Secondary | ICD-10-CM

## 2013-06-15 DIAGNOSIS — S199XXA Unspecified injury of neck, initial encounter: Secondary | ICD-10-CM | POA: Insufficient documentation

## 2013-06-15 DIAGNOSIS — F3289 Other specified depressive episodes: Secondary | ICD-10-CM | POA: Insufficient documentation

## 2013-06-15 DIAGNOSIS — S0990XA Unspecified injury of head, initial encounter: Secondary | ICD-10-CM | POA: Diagnosis not present

## 2013-06-15 DIAGNOSIS — Z8719 Personal history of other diseases of the digestive system: Secondary | ICD-10-CM | POA: Insufficient documentation

## 2013-06-15 DIAGNOSIS — G459 Transient cerebral ischemic attack, unspecified: Secondary | ICD-10-CM | POA: Diagnosis not present

## 2013-06-15 DIAGNOSIS — I951 Orthostatic hypotension: Secondary | ICD-10-CM | POA: Diagnosis not present

## 2013-06-15 DIAGNOSIS — E785 Hyperlipidemia, unspecified: Secondary | ICD-10-CM | POA: Insufficient documentation

## 2013-06-15 DIAGNOSIS — E119 Type 2 diabetes mellitus without complications: Secondary | ICD-10-CM | POA: Insufficient documentation

## 2013-06-15 DIAGNOSIS — Z8739 Personal history of other diseases of the musculoskeletal system and connective tissue: Secondary | ICD-10-CM | POA: Insufficient documentation

## 2013-06-15 DIAGNOSIS — S064X9A Epidural hemorrhage with loss of consciousness of unspecified duration, initial encounter: Secondary | ICD-10-CM | POA: Diagnosis not present

## 2013-06-15 DIAGNOSIS — Z79899 Other long term (current) drug therapy: Secondary | ICD-10-CM | POA: Insufficient documentation

## 2013-06-15 DIAGNOSIS — S0993XA Unspecified injury of face, initial encounter: Secondary | ICD-10-CM | POA: Diagnosis not present

## 2013-06-15 DIAGNOSIS — I10 Essential (primary) hypertension: Secondary | ICD-10-CM | POA: Insufficient documentation

## 2013-06-15 DIAGNOSIS — Z794 Long term (current) use of insulin: Secondary | ICD-10-CM | POA: Insufficient documentation

## 2013-06-15 DIAGNOSIS — I498 Other specified cardiac arrhythmias: Secondary | ICD-10-CM | POA: Diagnosis not present

## 2013-06-15 DIAGNOSIS — I2510 Atherosclerotic heart disease of native coronary artery without angina pectoris: Secondary | ICD-10-CM | POA: Diagnosis not present

## 2013-06-15 DIAGNOSIS — R269 Unspecified abnormalities of gait and mobility: Secondary | ICD-10-CM | POA: Diagnosis not present

## 2013-06-15 DIAGNOSIS — R7989 Other specified abnormal findings of blood chemistry: Secondary | ICD-10-CM | POA: Diagnosis not present

## 2013-06-15 DIAGNOSIS — W050XXA Fall from non-moving wheelchair, initial encounter: Secondary | ICD-10-CM | POA: Insufficient documentation

## 2013-06-15 DIAGNOSIS — F411 Generalized anxiety disorder: Secondary | ICD-10-CM | POA: Insufficient documentation

## 2013-06-15 DIAGNOSIS — Z853 Personal history of malignant neoplasm of breast: Secondary | ICD-10-CM | POA: Insufficient documentation

## 2013-06-15 DIAGNOSIS — Y92009 Unspecified place in unspecified non-institutional (private) residence as the place of occurrence of the external cause: Secondary | ICD-10-CM | POA: Insufficient documentation

## 2013-06-15 DIAGNOSIS — Z9089 Acquired absence of other organs: Secondary | ICD-10-CM | POA: Diagnosis not present

## 2013-06-15 LAB — CBC
HCT: 35.9 % — ABNORMAL LOW (ref 36.0–46.0)
Hemoglobin: 12.3 g/dL (ref 12.0–15.0)
MCH: 32.8 pg (ref 26.0–34.0)
MCHC: 34.3 g/dL (ref 30.0–36.0)
MCV: 95.7 fL (ref 78.0–100.0)
Platelets: 333 10*3/uL (ref 150–400)
RBC: 3.75 MIL/uL — ABNORMAL LOW (ref 3.87–5.11)
RDW: 14.3 % (ref 11.5–15.5)
WBC: 4.6 10*3/uL (ref 4.0–10.5)

## 2013-06-15 LAB — BASIC METABOLIC PANEL
BUN: 12 mg/dL (ref 6–23)
CO2: 27 mEq/L (ref 19–32)
Calcium: 9.5 mg/dL (ref 8.4–10.5)
Chloride: 104 mEq/L (ref 96–112)
Creatinine, Ser: 0.54 mg/dL (ref 0.50–1.10)
GFR calc Af Amer: >90 mL/min (ref >90)
GFR calc non Af Amer: 87 mL/min — ABNORMAL LOW (ref >90)
Glucose, Bld: 127 mg/dL — ABNORMAL HIGH (ref 70–99)
Potassium: 3.9 mEq/L (ref 3.5–5.1)
Sodium: 139 mEq/L (ref 135–145)

## 2013-06-15 LAB — CBC WITH DIFFERENTIAL/PLATELET
Basophils Absolute: 0.1 10*3/uL (ref 0.0–0.1)
Basophils Relative: 2 % — ABNORMAL HIGH (ref 0–1)
Eosinophils Absolute: 0.2 10*3/uL (ref 0.0–0.7)
Eosinophils Relative: 5 % (ref 0–5)
HCT: 38.2 % (ref 36.0–46.0)
Hemoglobin: 13.1 g/dL (ref 12.0–15.0)
Lymphocytes Relative: 35 % (ref 12–46)
Lymphs Abs: 1.5 10*3/uL (ref 0.7–4.0)
MCH: 32.5 pg (ref 26.0–34.0)
MCHC: 34.3 g/dL (ref 30.0–36.0)
MCV: 94.8 fL (ref 78.0–100.0)
Monocytes Absolute: 0.3 10*3/uL (ref 0.1–1.0)
Monocytes Relative: 6 % (ref 3–12)
Neutro Abs: 2.3 10*3/uL (ref 1.7–7.7)
Neutrophils Relative %: 52 % (ref 43–77)
Platelets: 311 10*3/uL (ref 150–400)
RBC: 4.03 MIL/uL (ref 3.87–5.11)
RDW: 14.4 % (ref 11.5–15.5)
WBC: 4.4 10*3/uL (ref 4.0–10.5)

## 2013-06-15 LAB — COMPREHENSIVE METABOLIC PANEL
ALT: 137 U/L — ABNORMAL HIGH (ref 0–35)
AST: 172 U/L — ABNORMAL HIGH (ref 0–37)
Albumin: 3.4 g/dL — ABNORMAL LOW (ref 3.5–5.2)
Alkaline Phosphatase: 507 U/L — ABNORMAL HIGH (ref 39–117)
BUN: 12 mg/dL (ref 6–23)
CO2: 24 mEq/L (ref 19–32)
Calcium: 9.8 mg/dL (ref 8.4–10.5)
Chloride: 100 mEq/L (ref 96–112)
Creatinine, Ser: 0.53 mg/dL (ref 0.50–1.10)
GFR calc Af Amer: >90 mL/min (ref >90)
GFR calc non Af Amer: 87 mL/min — ABNORMAL LOW (ref >90)
Glucose, Bld: 183 mg/dL — ABNORMAL HIGH (ref 70–99)
Potassium: 4.2 mEq/L (ref 3.5–5.1)
Sodium: 134 mEq/L — ABNORMAL LOW (ref 135–145)
Total Bilirubin: 0.8 mg/dL (ref 0.3–1.2)
Total Protein: 7.4 g/dL (ref 6.0–8.3)

## 2013-06-15 LAB — GLUCOSE, CAPILLARY
Glucose-Capillary: 112 mg/dL — ABNORMAL HIGH (ref 70–99)
Glucose-Capillary: 118 mg/dL — ABNORMAL HIGH (ref 70–99)
Glucose-Capillary: 114 mg/dL — ABNORMAL HIGH (ref 70–99)

## 2013-06-15 LAB — TROPONIN I: Troponin I: <0.3 ng/mL (ref <0.30)

## 2013-06-15 LAB — ETHANOL: Alcohol, Ethyl (B): <11 mg/dL (ref 0–11)

## 2013-06-15 LAB — PROTIME-INR
INR: 0.93 (ref 0.00–1.49)
Prothrombin Time: 12.3 seconds (ref 11.6–15.2)

## 2013-06-15 LAB — ACETAMINOPHEN LEVEL: Acetaminophen (Tylenol), Serum: <15 ug/mL (ref 10–30)

## 2013-06-15 MED ORDER — MIDODRINE HCL 5 MG PO TABS: 5.0000 mg | ORAL_TABLET | Freq: Three times a day (TID) | ORAL | Status: DC

## 2013-06-15 MED ORDER — SPIRONOLACTONE 12.5 MG HALF TABLET: 12.5000 mg | ORAL_TABLET | Freq: Every day | ORAL | Status: DC

## 2013-06-15 MED ORDER — METOPROLOL SUCCINATE ER 25 MG PO TB24: 25.0000 mg | ORAL_TABLET | Freq: Every day | ORAL | Status: DC

## 2013-06-15 MED ORDER — MIRTAZAPINE 15 MG PO TBDP: 15.0000 mg | ORAL_TABLET | Freq: Every day | ORAL | Status: DC

## 2013-06-15 NOTE — ED Notes (Signed)
Daughter at bedside.

## 2013-06-15 NOTE — Progress Notes (Signed)
Patient ID: [name], female   DOB: October 31, 1931, 79 y.o.   MRN: [id number]  Winston A. Merlene Laughter, MD     [email or web address]          [name] is an 79 y.o. female.   Assessment/Plan: 1. Severe extracranial and intracranial occlusive disease with orthostatic hypotension aggravating the patient's condition and causing ischemia. Again, hypotension should be avoided. The midodrine has been well tolerated and will be continued. This can be increased to needed. She is continue with the antiplatelet agents and lipid-lowering agents. The plan is discussed at length with the family.  She is awake and alert. She did feel fields are intact and extra ocular movements are full. She has good strength in the upper and lower extremities.    Objective: Vital signs in last 24 hours: Temp:  [97.9 F (36.6 C)-98.3 F (36.8 C)] 98.2 F (36.8 C) (07/31 0447) Pulse Rate:  [65-67] 67 (07/31 0447) Resp:  [20] 20 (07/31 0447) BP: (132-177)/(63-86) 132/84 mmHg (07/31 0447) SpO2:  [96 %-98 %] 96 % (07/31 0724)  Intake/Output from previous day: 07/30 0701 - 07/31 0700 In: 820 [P.O.:820] Out: -  Intake/Output this shift:   Nutritional status: Carb Control   Lab Results: Results for orders placed during the hospital encounter of 06/12/13 (from the past 48 hour(s))  GLUCOSE, CAPILLARY     Status: Abnormal   Collection Time    06/13/13 12:01 PM      Result Value Range   Glucose-Capillary 117 (*) 70 - 99 mg/dL   Comment 1 Notify RN    GLUCOSE, CAPILLARY     Status: Abnormal   Collection Time    06/13/13  4:28 PM      Result Value Range   Glucose-Capillary 155 (*) 70 - 99 mg/dL   Comment 1 Documented in Chart     Comment 2 Notify RN    GLUCOSE, CAPILLARY     Status: Abnormal   Collection Time    06/13/13  8:55 PM      Result Value Range   Glucose-Capillary 116 (*) 70 - 99 mg/dL   Comment 1 Notify RN    BASIC METABOLIC PANEL     Status: Abnormal   Collection Time      06/14/13  5:16 AM      Result Value Range   Sodium 135  135 - 145 mEq/L   Potassium 4.4  3.5 - 5.1 mEq/L   Comment: DELTA CHECK NOTED   Chloride 100  96 - 112 mEq/L   CO2 26  19 - 32 mEq/L   Glucose, Bld 188 (*) 70 - 99 mg/dL   BUN 12  6 - 23 mg/dL   Creatinine, Ser 0.54  0.50 - 1.10 mg/dL   Calcium 9.7  8.4 - 10.5 mg/dL   GFR calc non Af Amer 87 (*) >90 mL/min   GFR calc Af Amer >90  >90 mL/min   Comment:            The eGFR has been calculated     using the CKD EPI equation.     This calculation has not been     validated in all clinical     situations.     eGFR's persistently     <90 mL/min signify     possible Chronic Kidney Disease.  GLUCOSE, CAPILLARY     Status: Abnormal   Collection Time    06/14/13  7:10 AM  Result Value Range   Glucose-Capillary 164 (*) 70 - 99 mg/dL  HEPATIC FUNCTION PANEL     Status: Abnormal   Collection Time    06/14/13  8:14 AM      Result Value Range   Total Protein 7.9  6.0 - 8.3 g/dL   Albumin 3.6  3.5 - 5.2 g/dL   AST 89 (*) 0 - 37 U/L   ALT 81 (*) 0 - 35 U/L   Alkaline Phosphatase 392 (*) 39 - 117 U/L   Total Bilirubin 0.9  0.3 - 1.2 mg/dL   Bilirubin, Direct 0.2  0.0 - 0.3 mg/dL   Indirect Bilirubin 0.7  0.3 - 0.9 mg/dL  GLUCOSE, CAPILLARY     Status: None   Collection Time    06/14/13 11:40 AM      Result Value Range   Glucose-Capillary 99  70 - 99 mg/dL  GLUCOSE, CAPILLARY     Status: Abnormal   Collection Time    06/14/13  5:04 PM      Result Value Range   Glucose-Capillary 169 (*) 70 - 99 mg/dL  GLUCOSE, CAPILLARY     Status: Abnormal   Collection Time    06/14/13  9:02 PM      Result Value Range   Glucose-Capillary 130 (*) 70 - 99 mg/dL  BASIC METABOLIC PANEL     Status: Abnormal   Collection Time    06/15/13  5:30 AM      Result Value Range   Sodium 139  135 - 145 mEq/L   Potassium 3.9  3.5 - 5.1 mEq/L   Chloride 104  96 - 112 mEq/L   CO2 27  19 - 32 mEq/L   Glucose, Bld 127 (*) 70 - 99 mg/dL   BUN 12   6 - 23 mg/dL   Creatinine, Ser 0.54  0.50 - 1.10 mg/dL   Calcium 9.5  8.4 - 10.5 mg/dL   GFR calc non Af Amer 87 (*) >90 mL/min   GFR calc Af Amer >90  >90 mL/min   Comment:            The eGFR has been calculated     using the CKD EPI equation.     This calculation has not been     validated in all clinical     situations.     eGFR's persistently     <90 mL/min signify     possible Chronic Kidney Disease.  CBC     Status: Abnormal   Collection Time    06/15/13  5:30 AM      Result Value Range   WBC 4.6  4.0 - 10.5 K/uL   RBC 3.75 (*) 3.87 - 5.11 MIL/uL   Hemoglobin 12.3  12.0 - 15.0 g/dL   HCT 35.9 (*) 36.0 - 46.0 %   MCV 95.7  78.0 - 100.0 fL   MCH 32.8  26.0 - 34.0 pg   MCHC 34.3  30.0 - 36.0 g/dL   RDW 14.3  11.5 - 15.5 %   Platelets 333  150 - 400 K/uL  GLUCOSE, CAPILLARY     Status: Abnormal   Collection Time    06/15/13  7:13 AM      Result Value Range   Glucose-Capillary 112 (*) 70 - 99 mg/dL    Lipid Panel No results found for this basename: CHOL, TRIG, HDL, CHOLHDL, VLDL, LDLCALC,  in the last 72 hours  Studies/Results: No results found.  Medications:  Scheduled Meds: . aspirin  325 mg Oral Daily  . atorvastatin  10 mg Oral Daily  . docusate sodium  100 mg Oral BID  . enoxaparin (LOVENOX) injection  40 mg Subcutaneous Q24H  . insulin aspart  0-15 Units Subcutaneous TID WC  . insulin glargine  5 Units Subcutaneous QHS  . metoprolol succinate  25 mg Oral Daily  . midodrine  5 mg Oral TID WC  . mirtazapine  15 mg Oral QHS  . pantoprazole  40 mg Oral Daily  . sodium chloride  3 mL Intravenous Q12H  . spironolactone  12.5 mg Oral Daily  . tiotropium  18 mcg Inhalation Daily   Continuous Infusions:  PRN Meds:.acetaminophen, acetaminophen, ALPRAZolam, alum & mag hydroxide-simeth, bisacodyl, cloNIDine, diphenhydrAMINE, docusate sodium, HYDROcodone-acetaminophen, ondansetron (ZOFRAN) IV, ondansetron    LOS: 3 days   Jhett Fretwell A. Merlene Laughter, M.D.  Diplomate,  [employer]iatry and Neurology ( Neurology).

## 2013-06-15 NOTE — ED Notes (Signed)
Pt tolerated ambulation with walker. Continues to deny pain accept for head. IV placed and CT called.

## 2013-06-15 NOTE — Discharge Summary (Signed)
Physician Discharge Summary  Patient ID: [name] MRN: [id number] DOB/AGE: May 20, 1931 79 y.o. Primary Care Physician:Luz Mares, MD Admit date: 06/12/2013 Discharge date: 06/15/2013    Discharge Diagnoses:  Principal Problem:   Tachycardia Active Problems:   DIABETES MELLITUS, TYPE II, UNCONTROLLED   HYPERLIPIDEMIA   ANXIETY   HYPERTENSION   CORONARY ATHEROSCLEROSIS NATIVE CORONARY ARTERY   Elevated transaminase level   Leg weakness, bilateral   Hypomagnesemia   PSVT (paroxysmal supraventricular tachycardia) Bilateral carotid artery occlusion Orthostatic hypotension     Medication List         acetaminophen 500 MG tablet  Commonly known as:  TYLENOL  Take 500 mg by mouth every 6 (six) hours as needed for pain.     ALPRAZolam 0.25 MG dissolvable tablet  Commonly known as:  NIRAVAM  Take 0.25 mg by mouth at bedtime as needed.     aspirin 325 MG tablet  Take 325 mg by mouth daily.     atorvastatin 10 MG tablet  Commonly known as:  LIPITOR  Take 10 mg by mouth daily.     CALCIUM CARBONATE PO  Take 1,000 mg by mouth daily.     cholecalciferol 1000 UNITS tablet  Commonly known as:  VITAMIN D  Take 1,000 Units by mouth daily.     Co Q 10 100 MG Caps  Take 100 mg by mouth daily.     docusate sodium 100 MG capsule  Commonly known as:  COLACE  Take 100 mg by mouth 2 (two) times daily.     esomeprazole 40 MG capsule  Commonly known as:  NEXIUM  Take 40 mg by mouth daily before breakfast.     fish oil-omega-3 fatty acids 1000 MG capsule  Take 2 g by mouth daily.     glyBURIDE-metformin 5-500 MG per tablet  Commonly known as:  GLUCOVANCE  Take 1 tablet by mouth 2 (two) times daily with a meal.     insulin glargine 100 UNIT/ML injection  Commonly known as:  LANTUS  Inject 2-8 Units into the skin at bedtime. Give if BS 140-150= 3 units, 151-160=4 units, 161-170=5 units, 171-180= 6 units, >181=7units     metoprolol succinate 25 MG 24 hr tablet   Commonly known as:  TOPROL-XL  Take 1 tablet (25 mg total) by mouth daily.     midodrine 5 MG tablet  Commonly known as:  PROAMATINE  Take 1 tablet (5 mg total) by mouth 3 (three) times daily with meals.     mirtazapine 15 MG disintegrating tablet  Commonly known as:  REMERON SOL-TAB  Take 1 tablet (15 mg total) by mouth at bedtime.     polyethylene glycol packet  Commonly known as:  MIRALAX / GLYCOLAX  Take 17 g by mouth daily as needed (Constipation).     spironolactone 12.5 mg Tabs  Commonly known as:  ALDACTONE  Take 0.5 tablets (12.5 mg total) by mouth daily.     tiotropium 18 MCG inhalation capsule  Commonly known as:  SPIRIVA  Place 18 mcg into inhaler and inhale daily as needed (shortness of breath).        Discharged Condition: improved    Consults: Neurology and Cardiology  Significant Diagnostic Studies: Ct Head Wo Contrast - If Head Trauma Is Known/suspected And/or Pt Is Taking Anticoagulant  06/12/2013   *RADIOLOGY REPORT*  Clinical Data: Tachycardia.  Lower extremity weakness.  CT HEAD WITHOUT CONTRAST  Technique:  Contiguous axial images were obtained from the base of the  skull through the vertex without contrast.  Comparison: 03/28/2010.  Findings: No mass lesion, mass effect, midline shift, hydrocephalus, hemorrhage.  No acute territorial cortical ischemia/infarct. Atrophy and chronic ischemic white matter disease is present.  Calcified scalp lesion in the right frontal region. Vertebral basilar dolichoectasia. Mastoid air cells are within normal limits.  Paranasal sinuses normal.  Calvarium intact.  IMPRESSION: Atrophy and chronic ischemic white matter disease without acute intracranial abnormality.   Original Report Authenticated By: Dereck Ligas, M.D.   [facility] Wo Contrast  06/12/2013   *RADIOLOGY REPORT*  Clinical Data: Leg weakness.  Rule out basilar stenosis  MRA HEAD WITHOUT CONTRAST  Technique: Angiographic images of the Circle of Willis were  obtained using MRA technique without intravenous contrast.  Comparison: MRI brain today.  CT head 06/04/2013  Findings: Both vertebral arteries are widely patent.  Basilar artery is widely patent.  Vertebral arteries are similar in size of relatively large in size to severe anterior circulation disease. The posterior cerebral arteries are patent bilaterally.  The right internal carotid artery is occluded through the cavernous segment and reconstitutes via of the right posterior communicating artery which is a prominent vessel.  The anterior and middle cerebral arteries are patent on the right.  The left internal carotid artery is probably patent but appears small with low flow.  This suggests a severe stenosis of the left internal carotid artery in the neck.  The left posterior communicator artery is patent and supplies the anterior circulation.  The left middle cerebral artery is small due to diffuse disease.  There is decreased perfusion of left middle cerebral artery territory.  There is a prominent left ophthalmic artery which is most likely a collateral source of circulation to the left internal carotid artery.  Negative for aneurysm.  IMPRESSION: Occlusion of the right internal carotid artery.  The right anterior and middle cerebral arteries are supplied via the right posterior communicating artery.  The left internal carotid artery shows decreased flow and decreased caliber due to slow flow suggesting a severe proximal internal carotid artery stenosis in the neck. Carotid Doppler suggest left internal carotid artery occlusion.  Consider CTA of the head and neck for further evaluation to determine severe stenosis versus occlusion.  In addition, there appears to be atherosclerotic disease throughout the left M1 segment.  Widely patent posterior circulation.   Original Report Authenticated By: Carl Best, M.D.   Mr [facility] Contrast  06/12/2013   *RADIOLOGY REPORT*  Clinical Data: Leg weakness.   Hypertension diabetes and hyperlipidemia  MRI HEAD WITHOUT AND WITH CONTRAST  Technique:  Multiplanar, multiecho pulse sequences of the brain and surrounding structures were obtained according to standard protocol without and with intravenous contrast  Contrast: 25m MULTIHANCE GADOBENATE DIMEGLUMINE 529 MG/ML IV SOLN  Comparison: CT 06/12/2013  Findings: Negative for acute infarct.  Chronic microvascular ischemic changes are present, asymmetric on the left.  Small chronic infarct left frontal lobe.  Brainstem and cerebellum are negative.  Negative for intracranial hemorrhage or mass lesion.  Age appropriate atrophy.  Negative for hydrocephalus.  Soft tissue mass in the right posterior parotid gland measures 16 x 30 mm on sagittal images.  This appears larger compared with 2007 MRI of the neck which also identified this lesion.  Postcontrast imaging reveals normal enhancement.  No intracranial mass lesion is identified.  IMPRESSION: Atrophy and chronic microvascular ischemia, asymmetric on the left suggestive of chronic MCA ischemia.  Mass lesion in the right posterior parotid gland, enlarged  in 2007. 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   US Carotid Duplex Bilateral  06/12/2013   *RADIOLOGY REPORT*  Clinical Data: TIA symptoms  BILATERAL CAROTID DUPLEX ULTRASOUND  Technique: Pearline Cables scale imaging, color Doppler and duplex ultrasound were performed of bilateral carotid and vertebral arteries in the neck.  Comparison:  None.  Criteria:  Quantification of carotid stenosis is based on velocity parameters that correlate the residual internal carotid diameter with NASCET-based stenosis levels, using the diameter of the distal internal carotid lumen as the denominator for stenosis measurement.  The following velocity measurements were obtained:                   PEAK SYSTOLIC/END DIASTOLIC RIGHT ICA:                        Occludedcm/sec CCA:                        93/7JI/RCV  SYSTOLIC ICA/CCA RATIO:     Not applicable DIASTOLIC ICA/CCA RATIO:    Not applicable ECA:                        71cm/sec  LEFT ICA:                        Occludedcm/sec CCA:                        89/3YB/OFB SYSTOLIC ICA/CCA RATIO:     Not applicable DIASTOLIC ICA/CCA RATIO:    Not applicable ECA:                        61cm/sec  Findings:  RIGHT CAROTID ARTERY: The internal carotid artery is occluded just beyond its origin  RIGHT VERTEBRAL ARTERY:  Antegrade  LEFT CAROTID ARTERY: The internal carotid artery is occluded just beyond its origin.  LEFT VERTEBRAL ARTERY:  Antegrade  IMPRESSION: Bilateral internal carotid artery occlusion.   Original Report Authenticated By: Inez Catalina, M.D.   Dg Chest Port 1 View  06/12/2013   *RADIOLOGY REPORT*  Clinical Data: Lower extremity weakness.  PORTABLE CHEST - 1 VIEW  Comparison: 05/30/2013.  Findings: Chronic cardiomegaly.  Aortic atherosclerosis.  No change in heart size and mediastinal contours.  Hyperinflated lungs.  Persistent asymmetric density in the medial right base ( this findings seen dating back to 2011).  There is streaky opacity in the retrocardiac lung.  Small pleural effusions may persist.  No pneumothorax.  No acute osseous findings.  IMPRESSION:  1.  Cardiomegaly and pulmonary venous congestion. 2.  Ongoing or recurrent right base infiltrate or atelectasis. 3.  COPD.   Original Report Authenticated By: Jorje Guild   Dg Chest Portable 1 View  05/30/2013   *RADIOLOGY REPORT*  Clinical Data: Short of breath  PORTABLE CHEST - 1 VIEW  Comparison: Prior chest x-ray 04/19/2013; acute abdominal series including frontal chest x-ray 05/08/2013  Findings: Continued improvement and patchy right lower lobe airspace disease over the past to chest x-rays.  There may be a small residual right pleural effusion.  Borderline enlarged heart stable compared to prior.  Trace atherosclerotic calcification of the transverse aorta.  Prominence of the interstitial  markings and pulmonary vascular congestion are unchanged.  No acute osseous abnormality.  No pneumothorax.  IMPRESSION:  1.  No  acute cardiopulmonary disease. 2.  Continued slight interval improvement and right lower lobe opacity.  There may be a small right pleural effusion.   Original Report Authenticated By: Jacqulynn Cadet, M.D.    Lab Results: Basic Metabolic Panel:  Recent Labs  06/14/13 0516 06/15/13 0530  NA 135 139  K 4.4 3.9  CL 100 104  CO2 26 27  GLUCOSE 188* 127*  BUN 12 12  CREATININE 0.54 0.54  CALCIUM 9.7 9.5   Liver Function Tests:  Recent Labs  06/14/13 0814  AST 89*  ALT 81*  ALKPHOS 392*  BILITOT 0.9  PROT 7.9  ALBUMIN 3.6     CBC:  Recent Labs  06/13/13 0514 06/15/13 0530  WBC 6.6 4.6  HGB 13.0 12.3  HCT 38.1 35.9*  MCV 95.3 95.7  PLT 393 333    No results found for this or any previous visit (from the past 240 hour(s)).   Hospital Course:  This is an 79 years old female patient who admitted due dizziness and tachycardia. She was seen by cardiology who started her on metoprolol for SVT. She was also evaluated by neurology. Her carotid doppler showed bilateral occulusion the carotid arteries. She is started on ASA and Midrin for hypotension. Patient discharged with home health.   Discharge Exam: Blood pressure 132/84, pulse 67, temperature 98.2 F (36.8 C), temperature source Oral, resp. rate 20, height _0  (1.6 m), weight 62 kg (136 lb 11 oz), SpO2 96.00%.    Disposition:  home        Follow-up Information   Follow up with [facility], [place] In 1 week.   Contact information:   [address] [place] [address] [phone]       Signed: Dmonte Maher  06/15/2013, 8:00 AM

## 2013-06-15 NOTE — ED Notes (Signed)
PT back from CT and X-ray.  Placed on monitor.

## 2013-06-15 NOTE — ED Notes (Signed)
Pt ao x 4.  To X-ray and CT.

## 2013-06-15 NOTE — Progress Notes (Signed)
Discharged instructions were reviewed with patient and family member.  Patient and family member were able to teach back, and verbalized understanding.  Patient was escorted by nurse tech to car via wheelchair.   [name] 06/15/2013

## 2013-06-15 NOTE — ED Provider Notes (Signed)
CSN: [id number]     Arrival date & time 06/15/13  1234 History     First MD Initiated Contact with Patient 06/15/13 1251     Chief Complaint  Patient presents with  . Fall   (Consider location/radiation/quality/duration/timing/severity/associated sxs/prior Treatment) HPI Comments: Patient arrives via EMS  after a fall. Her family was trying to get her up the steps in a wheelchair and she fell backwards striking her head. she had just been discharged from [facility] this morning. n she did not lose consciousness. She complains of a dull headache. Denies any nausea or vomiting. She takes aspirin but no other anticoagulants. Denies any focal weakness, numbness or tingling. No chest pain or shortness of breath. She is at her baseline per her daughter. Daughter states this is accidental do have home health arranged. They have declined nursing home placement.  The history is provided by the patient, a relative and the EMS personnel.    Past Medical History  Diagnosis Date  . Hemorrhage of gastrointestinal tract, unspecified   . Other malaise and fatigue   . Tobacco use disorder   . Parotid tumor   . Neoplasm of malignant     breast  ,HX   . Diabetes mellitus   . Osteopenia   . Hypertension   . Hyperlipidemia   . Depression   . Anxiety state, unspecified    Past Surgical History  Procedure Laterality Date  . Mastectomy  1994    left breast -related to breast cancer  . Vesicovaginal fistula closure w/ tah  1979  . Cholecystectomy  2008  . Hemorrhoid surgery  1960s  . Laparoscopic appendectomy N/A 04/13/2013    Procedure: APPENDECTOMY LAPAROSCOPIC;  Surgeon: Donato Heinz, MD;  Location: [facility];  Service: General;  Laterality: N/A;  . Appendectomy     Family History  Problem Relation Age of Onset  . Heart failure Mother     CHF  . Cirrhosis Brother   . Lung cancer Father     brain mets  . Kidney cancer Mother   . Kidney cancer Sister    History  Substance Use  Topics  . Smoking status: Former Smoker -- 0.50 packs/day for 40 years    Types: Cigarettes    Quit date: 03/30/2013  . Smokeless tobacco: Not on file  . Alcohol Use: No   OB History   Grav Para Term Preterm Abortions TAB SAB Ect Mult Living                 Review of Systems  Constitutional: Negative for activity change and appetite change.  HENT: Positive for neck pain. Negative for congestion and rhinorrhea.   Respiratory: Negative for chest tightness.   Cardiovascular: Negative for chest pain.  Gastrointestinal: Negative for nausea, vomiting and abdominal pain.  Genitourinary: Negative for dysuria, hematuria, vaginal bleeding and vaginal discharge.  Musculoskeletal: Negative for back pain.  Skin: Negative for rash.  Neurological: Positive for headaches. Negative for dizziness and weakness.  A complete 10 system review of systems was obtained and all systems are negative except as noted in the HPI and PMH.    Allergies  Penicillins  Home Medications   Current Outpatient Rx  Name  Route  Sig  Dispense  Refill  . acetaminophen (TYLENOL) 500 MG tablet   Oral   Take 500 mg by mouth every 6 (six) hours as needed for pain.         Marland Kitchen ALPRAZolam (NIRAVAM) 0.25 MG dissolvable  tablet   Oral   Take 0.25 mg by mouth at bedtime as needed.           Marland Kitchen aspirin 325 MG tablet   Oral   Take 325 mg by mouth daily.           Marland Kitchen atorvastatin (LIPITOR) 10 MG tablet   Oral   Take 10 mg by mouth daily.           Marland Kitchen CALCIUM CARBONATE PO   Oral   Take 1,000 mg by mouth daily.         . cholecalciferol (VITAMIN D) 1000 UNITS tablet   Oral   Take 1,000 Units by mouth daily.           . Coenzyme Q10 (CO Q 10) 100 MG CAPS   Oral   Take 100 mg by mouth daily.         Marland Kitchen docusate sodium (COLACE) 100 MG capsule   Oral   Take 100 mg by mouth 2 (two) times daily.         Marland Kitchen esomeprazole (NEXIUM) 40 MG capsule   Oral   Take 40 mg by mouth daily before breakfast.            . fish oil-omega-3 fatty acids 1000 MG capsule   Oral   Take 2 g by mouth daily.         Marland Kitchen glyBURIDE-metformin (GLUCOVANCE) 5-500 MG per tablet   Oral   Take 1 tablet by mouth 2 (two) times daily with a meal.          . insulin glargine (LANTUS) 100 UNIT/ML injection   Subcutaneous   Inject 2-8 Units into the skin at bedtime. Give if BS 140-150= 3 units, 151-160=4 units, 161-170=5 units, 171-180= 6 units, >181=7units         . metoprolol succinate (TOPROL-XL) 25 MG 24 hr tablet   Oral   Take 1 tablet (25 mg total) by mouth daily.   30 tablet   3   . midodrine (PROAMATINE) 5 MG tablet   Oral   Take 1 tablet (5 mg total) by mouth 3 (three) times daily with meals.   30 tablet   3   . mirtazapine (REMERON SOL-TAB) 15 MG disintegrating tablet   Oral   Take 1 tablet (15 mg total) by mouth at bedtime.   30 tablet   3   . polyethylene glycol (MIRALAX / GLYCOLAX) packet   Oral   Take 17 g by mouth daily as needed (Constipation).         Marland Kitchen spironolactone (ALDACTONE) 12.5 mg TABS   Oral   Take 0.5 tablets (12.5 mg total) by mouth daily.   30 tablet   3   . tiotropium (SPIRIVA) 18 MCG inhalation capsule   Inhalation   Place 18 mcg into inhaler and inhale daily as needed (shortness of breath).          BP 168/72  Pulse 66  Temp(Src) 98.3 F (36.8 C) (Oral)  Resp 19  SpO2 96% Physical Exam  Constitutional: She is oriented to person, place, and time. She appears well-developed and well-nourished. No distress.  HENT:  Head: Normocephalic and atraumatic.  Mouth/Throat: Oropharynx is clear and moist. No oropharyngeal exudate.  Eyes: Conjunctivae and EOM are normal. Pupils are equal, round, and reactive to light.  Neck: Normal range of motion. Neck supple.  No C spine pain, stepoff or deformity  Cardiovascular: Normal rate, regular rhythm and  normal heart sounds.   No murmur heard. Pulmonary/Chest: Effort normal and breath sounds normal. No respiratory  distress.  Abdominal: Soft. There is no tenderness. There is no rebound and no guarding.  Musculoskeletal: Normal range of motion. She exhibits no edema and no tenderness.  Neurological: She is alert and oriented to person, place, and time. No cranial nerve deficit. She exhibits normal muscle tone. Coordination normal.  4/5 strength throughout, CN 2-12 intact, no ataxia on finger to nose.    ED Course   Procedures (including critical care time)  Labs Reviewed  CBC WITH DIFFERENTIAL - Abnormal; Notable for the following:    Basophils Relative 2 (*)    All other components within normal limits  COMPREHENSIVE METABOLIC PANEL - Abnormal; Notable for the following:    Sodium 134 (*)    Glucose, Bld 183 (*)    Albumin 3.4 (*)    AST 172 (*)    ALT 137 (*)    Alkaline Phosphatase 507 (*)    GFR calc non Af Amer 87 (*)    All other components within normal limits  GLUCOSE, CAPILLARY - Abnormal; Notable for the following:    Glucose-Capillary 118 (*)    All other components within normal limits  GLUCOSE, CAPILLARY - Abnormal; Notable for the following:    Glucose-Capillary 114 (*)    All other components within normal limits  PROTIME-INR  TROPONIN I  ETHANOL  ACETAMINOPHEN LEVEL   Dg Chest 2 View  06/15/2013   *RADIOLOGY REPORT*  Clinical Data: Fall, weakness, diabetes  CHEST - 2 VIEW  Comparison: 06/12/2013, 03/28/2010  Findings: Chronic hyperinflation with central bronchitic changes and interstitial prominence.  Mild cardiac enlargement without definite CHF.  No collapse, definite pneumonia, effusion or pneumothorax.  Trachea is midline.  Atherosclerosis of the aorta.  IMPRESSION: Stable chronic changes and COPD/emphysema.  No superimposed acute process   Original Report Authenticated By: Jerilynn Mages. Annamaria Boots, M.D.   Ct Head Wo Contrast  06/15/2013   *RADIOLOGY REPORT*  Clinical Data:  Patient fell while walking down the stairs.  Right neck soreness.  CT HEAD WITHOUT CONTRAST CT CERVICAL SPINE  WITHOUT CONTRAST  Technique:  Multidetector CT imaging of the head and cervical spine was performed following the standard protocol without intravenous contrast.  Multiplanar CT image reconstructions of the cervical spine were also generated.  Comparison:  CT brain 06/12/2013; MR brain 06/12/2013.  CT HEAD  Findings: Mild periventricular white matter hypodensity compatible with chronic small vessel ischemic change.  No mass lesion, hemorrhage, or acute infarction. Unchanged calcified lesion within the right frontal region.  The mastoid air cells are well-aerated. Paranasal sinuses are unremarkable.  Visualized aspect of the globes are intact. Mild soft tissue swelling overlying the right posterior aspect of the skull without underlying fracture.  IMPRESSION:  Chronic ischemic white matter disease without acute intracranial abnormality.  CT CERVICAL SPINE  Findings: Normal anatomic alignment of the cervical spine without evidence for acute fracture.  Craniocervical junction is intact. Mild multilevel degenerative disc and facet disease most pronounced at C3-4.  Lungs demonstrate no significant interval change biapical emphysema with right greater than left apical scarring dating back to 2011.  There is a 2.1 x 1.7 cm soft tissue mass within the right parotid gland, better characterized on prior MRI.  IMPRESSION: 1. No evidence for acute cervical spine fracture.  2.  Multilevel degenerative changes.  3. Right parotid gland soft tissue mass, better characterized on prior MRI.   Original Report Authenticated By:  Lovey Newcomer, M.D   Ct Cervical Spine Wo Contrast  06/15/2013   *RADIOLOGY REPORT*  Clinical Data:  Patient fell while walking down the stairs.  Right neck soreness.  CT HEAD WITHOUT CONTRAST CT CERVICAL SPINE WITHOUT CONTRAST  Technique:  Multidetector CT imaging of the head and cervical spine was performed following the standard protocol without intravenous contrast.  Multiplanar CT image reconstructions of  the cervical spine were also generated.  Comparison:  CT brain 06/12/2013; MR brain 06/12/2013.  CT HEAD  Findings: Mild periventricular white matter hypodensity compatible with chronic small vessel ischemic change.  No mass lesion, hemorrhage, or acute infarction. Unchanged calcified lesion within the right frontal region.  The mastoid air cells are well-aerated. Paranasal sinuses are unremarkable.  Visualized aspect of the globes are intact. Mild soft tissue swelling overlying the right posterior aspect of the skull without underlying fracture.  IMPRESSION:  Chronic ischemic white matter disease without acute intracranial abnormality.  CT CERVICAL SPINE  Findings: Normal anatomic alignment of the cervical spine without evidence for acute fracture.  Craniocervical junction is intact. Mild multilevel degenerative disc and facet disease most pronounced at C3-4.  Lungs demonstrate no significant interval change biapical emphysema with right greater than left apical scarring dating back to 2011.  There is a 2.1 x 1.7 cm soft tissue mass within the right parotid gland, better characterized on prior MRI.  IMPRESSION: 1. No evidence for acute cervical spine fracture.  2.  Multilevel degenerative changes.  3. Right parotid gland soft tissue mass, better characterized on prior MRI.   Original Report Authenticated By: Lovey Newcomer, M.D   US Abdomen Complete  06/15/2013   *RADIOLOGY REPORT*  Clinical Data:  Elevated LFTs, diabetes, hypertension, prior cholecystectomy  ABDOMINAL ULTRASOUND COMPLETE  Comparison:  62,014  Findings:  Gallbladder:  Prior cholecystectomy.  Common Bile Duct:  Within normal limits in caliber.  Liver: No focal mass lesion identified.  Within normal limits in parenchymal echogenicity. Patent portal vein with normal hepato pedal flow.  IVC:  Appears normal.  Pancreas:  No abnormality identified.  Spleen:  Within normal limits in size and echotexture.  Right kidney:  Normal in size and parenchymal  echogenicity.  No evidence of mass or hydronephrosis. Minor pelvic fullness noted.  Left kidney:  Normal in size and parenchymal echogenicity.  No evidence of mass or hydronephrosis.  Incidental upper pole 10 mm cyst noted.  Abdominal Aorta:  No aneurysm identified.  IMPRESSION: Prior cholecystectomy.  No acute finding by ultrasound.   Original Report Authenticated By: Jerilynn Mages. Annamaria Boots, M.D.   1. Fall, initial encounter   2. Head injury, initial encounter     MDM  From wheelchair while trying to enter her house. No loss of consciousness. Behaving normally. No vomiting.  Patient was admitted with orthostatic hypotension, tachycardia, transient lower extremity weakness. 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  CT head and C-spine negative.  LFT elevated noted, slightly worse than during hospitalization.  S/p cholycystectomy. Patient denies abdominal pain, nausea, vomiting. US abdomen, ethanol, APAP unremarkable. There is no acute elevation of bilirubin to suggest an obstructive process. D/w PCP Dr. Legrand Rams who discharged her today. Agrees with holding lipitor and reassessing in office this week.  Family feels patient is at her baseline. They state they have enough help at home. They decline ambulance transport home.   Date: 06/15/2013  Rate: 70  Rhythm: normal sinus rhythm  QRS Axis: normal  Intervals: normal  ST/T Wave abnormalities:  normal  Conduction Disutrbances:none  Narrative Interpretation:   Old EKG Reviewed: unchanged    Ezequiel Essex, MD 06/15/13 [id number]

## 2013-06-15 NOTE — ED Notes (Addendum)
Patient arrived via [place] post fall from her wheelchair while being transported up the stairs at her home. Patient slide out of wheelchair and struck her posterior part of her head. Patient is Alert, VSS. According to EMS patient was just discharged from [facility] today. Family wanted her transported to [facility].

## 2013-06-16 DIAGNOSIS — I10 Essential (primary) hypertension: Secondary | ICD-10-CM | POA: Diagnosis not present

## 2013-06-16 DIAGNOSIS — K929 Disease of digestive system, unspecified: Secondary | ICD-10-CM | POA: Diagnosis not present

## 2013-06-16 DIAGNOSIS — F329 Major depressive disorder, single episode, unspecified: Secondary | ICD-10-CM | POA: Diagnosis not present

## 2013-06-16 DIAGNOSIS — E119 Type 2 diabetes mellitus without complications: Secondary | ICD-10-CM | POA: Diagnosis not present

## 2013-06-16 DIAGNOSIS — Z48815 Encounter for surgical aftercare following surgery on the digestive system: Secondary | ICD-10-CM | POA: Diagnosis not present

## 2013-06-16 DIAGNOSIS — F3289 Other specified depressive episodes: Secondary | ICD-10-CM | POA: Diagnosis not present

## 2013-06-16 DIAGNOSIS — F411 Generalized anxiety disorder: Secondary | ICD-10-CM | POA: Diagnosis not present

## 2013-06-19 DIAGNOSIS — Z48815 Encounter for surgical aftercare following surgery on the digestive system: Secondary | ICD-10-CM | POA: Diagnosis not present

## 2013-06-19 DIAGNOSIS — E119 Type 2 diabetes mellitus without complications: Secondary | ICD-10-CM | POA: Diagnosis not present

## 2013-06-19 DIAGNOSIS — I10 Essential (primary) hypertension: Secondary | ICD-10-CM | POA: Diagnosis not present

## 2013-06-19 DIAGNOSIS — F3289 Other specified depressive episodes: Secondary | ICD-10-CM | POA: Diagnosis not present

## 2013-06-19 DIAGNOSIS — F411 Generalized anxiety disorder: Secondary | ICD-10-CM | POA: Diagnosis not present

## 2013-06-19 DIAGNOSIS — F329 Major depressive disorder, single episode, unspecified: Secondary | ICD-10-CM | POA: Diagnosis not present

## 2013-06-19 DIAGNOSIS — K929 Disease of digestive system, unspecified: Secondary | ICD-10-CM | POA: Diagnosis not present

## 2013-06-20 DIAGNOSIS — F411 Generalized anxiety disorder: Secondary | ICD-10-CM | POA: Diagnosis not present

## 2013-06-20 DIAGNOSIS — F329 Major depressive disorder, single episode, unspecified: Secondary | ICD-10-CM | POA: Diagnosis not present

## 2013-06-20 DIAGNOSIS — Z48815 Encounter for surgical aftercare following surgery on the digestive system: Secondary | ICD-10-CM | POA: Diagnosis not present

## 2013-06-20 DIAGNOSIS — K929 Disease of digestive system, unspecified: Secondary | ICD-10-CM | POA: Diagnosis not present

## 2013-06-20 DIAGNOSIS — F3289 Other specified depressive episodes: Secondary | ICD-10-CM | POA: Diagnosis not present

## 2013-06-20 DIAGNOSIS — E119 Type 2 diabetes mellitus without complications: Secondary | ICD-10-CM | POA: Diagnosis not present

## 2013-06-20 DIAGNOSIS — I10 Essential (primary) hypertension: Secondary | ICD-10-CM | POA: Diagnosis not present

## 2013-06-21 ENCOUNTER — Emergency Department (HOSPITAL_COMMUNITY): Payer: Medicare Other

## 2013-06-21 ENCOUNTER — Encounter (HOSPITAL_COMMUNITY): Payer: Self-pay | Admitting: Emergency Medicine

## 2013-06-21 ENCOUNTER — Emergency Department (HOSPITAL_COMMUNITY)
Admission: EM | Admit: 2013-06-21 | Discharge: 2013-06-22 | Disposition: A | Discharge status: Home / Self Care | Payer: Medicare Other | Source: Home / Self Care | Attending: Emergency Medicine | Admitting: Emergency Medicine

## 2013-06-21 DIAGNOSIS — R1084 Generalized abdominal pain: Secondary | ICD-10-CM | POA: Insufficient documentation

## 2013-06-21 DIAGNOSIS — I471 Supraventricular tachycardia, unspecified: Secondary | ICD-10-CM | POA: Diagnosis not present

## 2013-06-21 DIAGNOSIS — I10 Essential (primary) hypertension: Secondary | ICD-10-CM | POA: Diagnosis present

## 2013-06-21 DIAGNOSIS — R0609 Other forms of dyspnea: Secondary | ICD-10-CM | POA: Diagnosis not present

## 2013-06-21 DIAGNOSIS — F29 Unspecified psychosis not due to a substance or known physiological condition: Secondary | ICD-10-CM | POA: Diagnosis not present

## 2013-06-21 DIAGNOSIS — J438 Other emphysema: Secondary | ICD-10-CM | POA: Diagnosis not present

## 2013-06-21 DIAGNOSIS — J189 Pneumonia, unspecified organism: Secondary | ICD-10-CM | POA: Diagnosis not present

## 2013-06-21 DIAGNOSIS — Z452 Encounter for adjustment and management of vascular access device: Secondary | ICD-10-CM | POA: Diagnosis not present

## 2013-06-21 DIAGNOSIS — I517 Cardiomegaly: Secondary | ICD-10-CM | POA: Diagnosis present

## 2013-06-21 DIAGNOSIS — R0989 Other specified symptoms and signs involving the circulatory and respiratory systems: Secondary | ICD-10-CM | POA: Diagnosis not present

## 2013-06-21 DIAGNOSIS — I679 Cerebrovascular disease, unspecified: Secondary | ICD-10-CM | POA: Diagnosis present

## 2013-06-21 DIAGNOSIS — R630 Anorexia: Secondary | ICD-10-CM | POA: Diagnosis not present

## 2013-06-21 DIAGNOSIS — R000 Tachycardia, unspecified: Secondary | ICD-10-CM | POA: Insufficient documentation

## 2013-06-21 DIAGNOSIS — Z853 Personal history of malignant neoplasm of breast: Secondary | ICD-10-CM | POA: Diagnosis not present

## 2013-06-21 DIAGNOSIS — R918 Other nonspecific abnormal finding of lung field: Secondary | ICD-10-CM | POA: Diagnosis not present

## 2013-06-21 DIAGNOSIS — IMO0001 Reserved for inherently not codable concepts without codable children: Secondary | ICD-10-CM | POA: Diagnosis not present

## 2013-06-21 DIAGNOSIS — E860 Dehydration: Secondary | ICD-10-CM | POA: Diagnosis not present

## 2013-06-21 DIAGNOSIS — Z87891 Personal history of nicotine dependence: Secondary | ICD-10-CM | POA: Diagnosis not present

## 2013-06-21 DIAGNOSIS — R51 Headache: Secondary | ICD-10-CM | POA: Diagnosis not present

## 2013-06-21 DIAGNOSIS — R52 Pain, unspecified: Secondary | ICD-10-CM | POA: Diagnosis not present

## 2013-06-21 DIAGNOSIS — E119 Type 2 diabetes mellitus without complications: Secondary | ICD-10-CM | POA: Diagnosis not present

## 2013-06-21 DIAGNOSIS — R509 Fever, unspecified: Secondary | ICD-10-CM | POA: Diagnosis not present

## 2013-06-21 DIAGNOSIS — R627 Adult failure to thrive: Secondary | ICD-10-CM | POA: Diagnosis not present

## 2013-06-21 DIAGNOSIS — R4182 Altered mental status, unspecified: Secondary | ICD-10-CM | POA: Insufficient documentation

## 2013-06-21 DIAGNOSIS — I951 Orthostatic hypotension: Secondary | ICD-10-CM | POA: Diagnosis present

## 2013-06-21 DIAGNOSIS — R7401 Elevation of levels of liver transaminase levels: Secondary | ICD-10-CM | POA: Diagnosis present

## 2013-06-21 DIAGNOSIS — R404 Transient alteration of awareness: Secondary | ICD-10-CM | POA: Diagnosis not present

## 2013-06-21 DIAGNOSIS — F411 Generalized anxiety disorder: Secondary | ICD-10-CM | POA: Diagnosis not present

## 2013-06-21 DIAGNOSIS — Z794 Long term (current) use of insulin: Secondary | ICD-10-CM | POA: Diagnosis not present

## 2013-06-21 DIAGNOSIS — M949 Disorder of cartilage, unspecified: Secondary | ICD-10-CM | POA: Diagnosis present

## 2013-06-21 DIAGNOSIS — Z79899 Other long term (current) drug therapy: Secondary | ICD-10-CM | POA: Insufficient documentation

## 2013-06-21 DIAGNOSIS — K929 Disease of digestive system, unspecified: Secondary | ICD-10-CM | POA: Diagnosis not present

## 2013-06-21 DIAGNOSIS — Z8739 Personal history of other diseases of the musculoskeletal system and connective tissue: Secondary | ICD-10-CM | POA: Insufficient documentation

## 2013-06-21 DIAGNOSIS — R5381 Other malaise: Secondary | ICD-10-CM | POA: Diagnosis not present

## 2013-06-21 DIAGNOSIS — Z48815 Encounter for surgical aftercare following surgery on the digestive system: Secondary | ICD-10-CM | POA: Diagnosis not present

## 2013-06-21 DIAGNOSIS — R413 Other amnesia: Secondary | ICD-10-CM | POA: Diagnosis not present

## 2013-06-21 DIAGNOSIS — F3289 Other specified depressive episodes: Secondary | ICD-10-CM | POA: Diagnosis not present

## 2013-06-21 DIAGNOSIS — E785 Hyperlipidemia, unspecified: Secondary | ICD-10-CM | POA: Insufficient documentation

## 2013-06-21 DIAGNOSIS — R112 Nausea with vomiting, unspecified: Secondary | ICD-10-CM | POA: Diagnosis not present

## 2013-06-21 DIAGNOSIS — I498 Other specified cardiac arrhythmias: Secondary | ICD-10-CM | POA: Diagnosis not present

## 2013-06-21 DIAGNOSIS — G8929 Other chronic pain: Secondary | ICD-10-CM | POA: Diagnosis not present

## 2013-06-21 DIAGNOSIS — R5383 Other fatigue: Secondary | ICD-10-CM | POA: Diagnosis not present

## 2013-06-21 DIAGNOSIS — Z9010 Acquired absence of unspecified breast and nipple: Secondary | ICD-10-CM | POA: Diagnosis not present

## 2013-06-21 DIAGNOSIS — R7989 Other specified abnormal findings of blood chemistry: Secondary | ICD-10-CM | POA: Diagnosis not present

## 2013-06-21 DIAGNOSIS — E871 Hypo-osmolality and hyponatremia: Secondary | ICD-10-CM | POA: Diagnosis not present

## 2013-06-21 DIAGNOSIS — G9341 Metabolic encephalopathy: Secondary | ICD-10-CM | POA: Diagnosis present

## 2013-06-21 DIAGNOSIS — M899 Disorder of bone, unspecified: Secondary | ICD-10-CM | POA: Diagnosis present

## 2013-06-21 DIAGNOSIS — I2510 Atherosclerotic heart disease of native coronary artery without angina pectoris: Secondary | ICD-10-CM | POA: Diagnosis not present

## 2013-06-21 DIAGNOSIS — Z8719 Personal history of other diseases of the digestive system: Secondary | ICD-10-CM | POA: Insufficient documentation

## 2013-06-21 DIAGNOSIS — F329 Major depressive disorder, single episode, unspecified: Secondary | ICD-10-CM | POA: Insufficient documentation

## 2013-06-21 DIAGNOSIS — Z7982 Long term (current) use of aspirin: Secondary | ICD-10-CM | POA: Insufficient documentation

## 2013-06-21 LAB — URINALYSIS, ROUTINE W REFLEX MICROSCOPIC
Bilirubin Urine: NEGATIVE
Glucose, UA: NEGATIVE mg/dL
Ketones, ur: 15 mg/dL — AB
Nitrite: NEGATIVE
Protein, ur: NEGATIVE mg/dL
Specific Gravity, Urine: 1.01 (ref 1.005–1.030)
Urobilinogen, UA: 0.2 mg/dL (ref 0.0–1.0)
pH: 7 (ref 5.0–8.0)

## 2013-06-21 LAB — GLUCOSE, CAPILLARY: Glucose-Capillary: 202 mg/dL — ABNORMAL HIGH (ref 70–99)

## 2013-06-21 LAB — URINE MICROSCOPIC-ADD ON

## 2013-06-21 MED ORDER — ONDANSETRON 4 MG PO TBDP
8.0000 mg | ORAL_TABLET | Freq: Once | ORAL | Status: AC
  Administered 2013-06-21: 8 mg via ORAL
  Filled 2013-06-21: qty 2

## 2013-06-21 MED ORDER — ACETAMINOPHEN 325 MG PO TABS: 975.0000 mg | ORAL_TABLET | Freq: Once | ORAL | Status: DC

## 2013-06-21 MED ORDER — ACETAMINOPHEN 325 MG PO TABS
650.0000 mg | ORAL_TABLET | Freq: Once | ORAL | Status: AC
  Administered 2013-06-21: 650 mg via ORAL
  Filled 2013-06-21: qty 2

## 2013-06-21 MED ORDER — SODIUM CHLORIDE 0.9 % IV BOLUS (SEPSIS)
1000.0000 mL | Freq: Once | INTRAVENOUS | Status: AC
  Administered 2013-06-21: 1000 mL via INTRAVENOUS

## 2013-06-21 NOTE — ED Provider Notes (Signed)
CSN: [id number]     Arrival date & time 06/21/13  2051 History     First MD Initiated Contact with Patient 06/21/13 2052     Chief Complaint  Patient presents with  . Headache   (Consider location/radiation/quality/duration/timing/severity/associated sxs/prior Treatment) The history is provided by the patient and medical records. No language interpreter was used.    [name] is a 79 y.o. female  with a hx of breast CA, lateral carotid artery occlusion, hypertension, hyperlipidemia, orthostatic hypotension presents to the Emergency Department complaining of gradual, persistent, progressively worsening headache beginning at 3 PM today. Patient family states that she had a large very hard bowel movement that required a significant amount of straining earlier in the day and left her feeling weak. Afterwards she developed a gradual headache beginning as a dull throb and generalized which became worse. Patient's family attempted to give her ibuprofen which caused her to vomit.  Patient's family also reports concern about her systolic blood pressure.  SBP 180's this afternoon.  Pt took her morning meds, but not her PM meds. Patient with mild dementia and therefore a level V caveat.   Past Medical History  Diagnosis Date  . Hemorrhage of gastrointestinal tract, unspecified   . Other malaise and fatigue   . Tobacco use disorder   . Parotid tumor   . Neoplasm of malignant     breast  ,HX   . Diabetes mellitus   . Osteopenia   . Hypertension   . Hyperlipidemia   . Depression   . Anxiety state, unspecified    Past Surgical History  Procedure Laterality Date  . Mastectomy  1994    left breast -related to breast cancer  . Vesicovaginal fistula closure w/ tah  1979  . Cholecystectomy  2008  . Hemorrhoid surgery  1960s  . Laparoscopic appendectomy N/A 04/13/2013    Procedure: APPENDECTOMY LAPAROSCOPIC;  Surgeon: Donato Heinz, MD;  Location: [facility];  Service: General;  Laterality: N/A;   . Appendectomy     Family History  Problem Relation Age of Onset  . Heart failure Mother     CHF  . Cirrhosis Brother   . Lung cancer Father     brain mets  . Kidney cancer Mother   . Kidney cancer Sister    History  Substance Use Topics  . Smoking status: Former Smoker -- 0.50 packs/day for 40 years    Types: Cigarettes    Quit date: 03/30/2013  . Smokeless tobacco: Not on file  . Alcohol Use: No   OB History   Grav Para Term Preterm Abortions TAB SAB Ect Mult Living                 Review of Systems  Unable to perform ROS: Mental status change  Constitutional: Negative for fever, diaphoresis, appetite change, fatigue and unexpected weight change.  HENT: Negative for mouth sores and neck stiffness.   Eyes: Negative for visual disturbance.  Respiratory: Negative for cough, chest tightness, shortness of breath and wheezing.   Cardiovascular: Negative for chest pain.  Gastrointestinal: Positive for nausea and vomiting. Negative for abdominal pain, diarrhea and constipation.  Endocrine: Negative for polydipsia, polyphagia and polyuria.  Genitourinary: Negative for dysuria, urgency, frequency and hematuria.  Musculoskeletal: Negative for back pain.  Skin: Negative for rash.  Allergic/Immunologic: Negative for immunocompromised state.  Neurological: Positive for headaches. Negative for syncope and light-headedness.  Hematological: Does not bruise/bleed easily.  Psychiatric/Behavioral: Negative for sleep disturbance. The patient  is not nervous/anxious.     Allergies  Penicillins  Home Medications   Current Outpatient Rx  Name  Route  Sig  Dispense  Refill  . acetaminophen (TYLENOL) 500 MG tablet   Oral   Take 500 mg by mouth every 6 (six) hours as needed for pain.         Marland Kitchen ALPRAZolam (NIRAVAM) 0.25 MG dissolvable tablet   Oral   Take 0.25 mg by mouth at bedtime as needed.           Marland Kitchen aspirin 325 MG tablet   Oral   Take 325 mg by mouth daily.            . Calcium Carbonate-Vit D-Min 600-400 MG-UNIT TABS   Oral   Take 1 tablet by mouth every morning.         . cholecalciferol (VITAMIN D) 1000 UNITS tablet   Oral   Take 1,000 Units by mouth every morning.          . Coenzyme Q10 (CO Q 10) 100 MG CAPS   Oral   Take 100 mg by mouth every morning.          . docusate sodium (COLACE) 100 MG capsule   Oral   Take 100 mg by mouth 2 (two) times daily.         Marland Kitchen esomeprazole (NEXIUM) 40 MG capsule   Oral   Take 40 mg by mouth at bedtime.          . fish oil-omega-3 fatty acids 1000 MG capsule   Oral   Take 2 g by mouth every morning.          . glyBURIDE-metformin (GLUCOVANCE) 5-500 MG per tablet   Oral   Take 2 tablets by mouth 2 (two) times daily with a meal.          . insulin glargine (LANTUS) 100 UNIT/ML injection   Subcutaneous   Inject 2-8 Units into the skin at bedtime. Give if BS 140-150= 3 units, 151-160=4 units, 161-170=5 units, 171-180= 6 units, >181=7units         . metoprolol succinate (TOPROL-XL) 25 MG 24 hr tablet   Oral   Take 1 tablet (25 mg total) by mouth daily.   30 tablet   3   . midodrine (PROAMATINE) 5 MG tablet   Oral   Take 1 tablet (5 mg total) by mouth 3 (three) times daily with meals.   30 tablet   3   . mirtazapine (REMERON SOL-TAB) 15 MG disintegrating tablet   Oral   Take 1 tablet (15 mg total) by mouth at bedtime.   30 tablet   3   . polyethylene glycol (MIRALAX / GLYCOLAX) packet   Oral   Take 17 g by mouth daily as needed (Constipation).         Marland Kitchen spironolactone (ALDACTONE) 12.5 mg TABS   Oral   Take 0.5 tablets (12.5 mg total) by mouth daily.   30 tablet   3   . tiotropium (SPIRIVA) 18 MCG inhalation capsule   Inhalation   Place 18 mcg into inhaler and inhale daily as needed (shortness of breath).          BP 155/59  Pulse 107  Temp(Src) 100.2 F (37.9 C) (Rectal)  Resp 23  SpO2 95% Physical Exam  Nursing note and vitals reviewed. Constitutional:  She is oriented to person, place, and time. She appears well-developed and well-nourished. No distress.  Patient appears  sleepy but is arousable to voice  HENT:  Head: Normocephalic and atraumatic.  Right Ear: Tympanic membrane, external ear and ear canal normal.  Left Ear: Tympanic membrane, external ear and ear canal normal.  Nose: Nose normal. No mucosal edema.  Mouth/Throat: Uvula is midline, oropharynx is clear and moist and mucous membranes are normal. Mucous membranes are not dry. No edematous. No oropharyngeal exudate, posterior oropharyngeal edema, posterior oropharyngeal erythema or tonsillar abscesses.  Eyes: Conjunctivae are normal. Pupils are equal, round, and reactive to light. No scleral icterus.  Neck: Normal range of motion. Neck supple.  Cardiovascular: Regular rhythm, normal heart sounds and intact distal pulses.   No murmur heard. Patient tachycardic in the low 100s   Pulmonary/Chest: Effort normal and breath sounds normal. No accessory muscle usage. Not tachypneic. No respiratory distress. She has no decreased breath sounds. She has no wheezes. She has no rhonchi. She has no rales.  Clear and equal breath sounds No rales or rhonchi  Abdominal: Soft. Bowel sounds are normal. She exhibits no mass. There is generalized tenderness (general tenderness throughout). There is no rigidity, no rebound and no guarding.  Patient actively vomiting - non-bloody non-bilious  Musculoskeletal: Normal range of motion. She exhibits no edema.  Neurological: She is alert and oriented to person, place, and time. She has normal strength. No cranial nerve deficit or sensory deficit. She exhibits normal muscle tone. GCS eye subscore is 3. GCS verbal subscore is 5. GCS motor subscore is 6.  Reflex Scores:      Tricep reflexes are 2+ on the right side and 2+ on the left side.      Bicep reflexes are 2+ on the right side and 2+ on the left side.      Brachioradialis reflexes are 2+ on the right side  and 2+ on the left side.      Patellar reflexes are 2+ on the right side and 2+ on the left side.      Achilles reflexes are 2+ on the right side and 2+ on the left side. Speech is clear and goal oriented, follows commands Major Cranial nerves without deficit, no facial droop Normal strength in upper and lower extremities bilaterally including dorsiflexion and plantar flexion, strong and equal grip strength Sensation normal to light and sharp touch Moves extremities without ataxia, coordination intact Normal finger to nose and rapid alternating movements  Skin: Skin is warm and dry. She is not diaphoretic.  Psychiatric: She has a normal mood and affect.    ED Course   Procedures (including critical care time)   Labs Reviewed  CBC - Abnormal; Notable for the following:    WBC 10.8 (*)    RBC 3.85 (*)    All other components within normal limits  BASIC METABOLIC PANEL - Abnormal; Notable for the following:    Sodium 134 (*)    Glucose, Bld 196 (*)    Creatinine, Ser 0.46 (*)    All other components within normal limits  URINALYSIS, ROUTINE W REFLEX MICROSCOPIC - Abnormal; Notable for the following:    Hgb urine dipstick SMALL (*)    Ketones, ur 15 (*)    Leukocytes, UA MODERATE (*)    All other components within normal limits  GLUCOSE, CAPILLARY - Abnormal; Notable for the following:    Glucose-Capillary 202 (*)    All other components within normal limits  CULTURE, BLOOD (ROUTINE X 2)  CULTURE, BLOOD (ROUTINE X 2)  URINE CULTURE  LIPASE, BLOOD  URINE  MICROSCOPIC-ADD ON  PROCALCITONIN  LACTIC ACID, PLASMA   Ct Head Wo Contrast  06/22/2013   *RADIOLOGY REPORT*  Clinical Data: Headache  CT HEAD WITHOUT CONTRAST  Technique:  Contiguous axial images were obtained from the base of the skull through the vertex without contrast.  Comparison: 06/15/2013  Findings: Limited with motion artifact through the posterior fossa and skull base.  Age-related brain atrophy with chronic  microvascular ischemic changes in the white matter diffusely.  No acute intracranial hemorrhage, mass lesion, definite infarction, midline shift, herniation, hydrocephalus, or extra-axial fluid collection.  Cisterns patent.  No cerebellar abnormality.  Mastoid sinuses grossly clear.  IMPRESSION: Stable atrophy and microvascular ischemic changes.  Limited with motion artifact.   Original Report Authenticated By: Jerilynn Mages. Annamaria Boots, M.D.   Dg Chest Port 1 View  06/21/2013   *RADIOLOGY REPORT*  Clinical Data: Fever and cough.  PORTABLE CHEST - 1 VIEW  Comparison: PA and lateral chest 06/15/2013.  Findings: The lungs are emphysematous but clear.  Heart size is mildly enlarged.  No pneumothorax or pleural effusion.  IMPRESSION: Emphysema and cardiomegaly without acute disease.   Original Report Authenticated By: Orlean Patten, M.D.   No diagnosis found.  MDM  [name] presents with headache, nausea and vomiting. Patient febrile here in the department and tachycardic. Concern for the Sirs vs sepsis.  Will obtain labs and give fluid bolus. We'll also attempt to gain control of nausea and vomiting.  2:44 AM Patient with resolved nausea vomiting. Labs pending.  With Dr. Stevie Kern who will follow.  Jarrett Soho Jeshua Ransford, PA-C 06/22/13 [phone]

## 2013-06-21 NOTE — ED Notes (Signed)
Pt returned from CT.

## 2013-06-21 NOTE — ED Notes (Signed)
Pt to ED via EMS with c/o severe headache for 24hr. Pt reports sensitivity to light. Denies other symptoms. No Hx of migraine. Alert and oriented x4. Per EMS, CBG-184 (diabetic), initial BP-201/68 then 159/84, HR-68. 22G line at (R) wrist.

## 2013-06-22 ENCOUNTER — Encounter (HOSPITAL_COMMUNITY): Payer: Self-pay | Admitting: Emergency Medicine

## 2013-06-22 ENCOUNTER — Inpatient Hospital Stay (HOSPITAL_COMMUNITY)
Admission: EM | Admit: 2013-06-22 | Discharge: 2013-06-27 | DRG: 640 | Disposition: A | Discharge status: Home Health | Payer: Medicare Other | Attending: Internal Medicine | Admitting: Internal Medicine

## 2013-06-22 ENCOUNTER — Emergency Department (HOSPITAL_COMMUNITY): Payer: Medicare Other

## 2013-06-22 DIAGNOSIS — R7989 Other specified abnormal findings of blood chemistry: Secondary | ICD-10-CM | POA: Diagnosis present

## 2013-06-22 DIAGNOSIS — E785 Hyperlipidemia, unspecified: Secondary | ICD-10-CM | POA: Diagnosis present

## 2013-06-22 DIAGNOSIS — Z794 Long term (current) use of insulin: Secondary | ICD-10-CM

## 2013-06-22 DIAGNOSIS — G9341 Metabolic encephalopathy: Secondary | ICD-10-CM | POA: Diagnosis present

## 2013-06-22 DIAGNOSIS — I471 Supraventricular tachycardia, unspecified: Secondary | ICD-10-CM | POA: Diagnosis present

## 2013-06-22 DIAGNOSIS — R51 Headache: Secondary | ICD-10-CM | POA: Diagnosis present

## 2013-06-22 DIAGNOSIS — F3289 Other specified depressive episodes: Secondary | ICD-10-CM | POA: Diagnosis present

## 2013-06-22 DIAGNOSIS — E860 Dehydration: Secondary | ICD-10-CM | POA: Diagnosis present

## 2013-06-22 DIAGNOSIS — I679 Cerebrovascular disease, unspecified: Secondary | ICD-10-CM | POA: Diagnosis present

## 2013-06-22 DIAGNOSIS — R404 Transient alteration of awareness: Secondary | ICD-10-CM | POA: Diagnosis present

## 2013-06-22 DIAGNOSIS — Z9010 Acquired absence of unspecified breast and nipple: Secondary | ICD-10-CM

## 2013-06-22 DIAGNOSIS — R7402 Elevation of levels of lactic acid dehydrogenase (LDH): Secondary | ICD-10-CM | POA: Diagnosis present

## 2013-06-22 DIAGNOSIS — F329 Major depressive disorder, single episode, unspecified: Secondary | ICD-10-CM | POA: Diagnosis present

## 2013-06-22 DIAGNOSIS — I959 Hypotension, unspecified: Secondary | ICD-10-CM

## 2013-06-22 DIAGNOSIS — IMO0001 Reserved for inherently not codable concepts without codable children: Secondary | ICD-10-CM | POA: Diagnosis present

## 2013-06-22 DIAGNOSIS — I10 Essential (primary) hypertension: Secondary | ICD-10-CM | POA: Diagnosis present

## 2013-06-22 DIAGNOSIS — F411 Generalized anxiety disorder: Secondary | ICD-10-CM | POA: Diagnosis present

## 2013-06-22 DIAGNOSIS — E871 Hypo-osmolality and hyponatremia: Principal | ICD-10-CM | POA: Diagnosis present

## 2013-06-22 DIAGNOSIS — R0600 Dyspnea, unspecified: Secondary | ICD-10-CM

## 2013-06-22 DIAGNOSIS — R627 Adult failure to thrive: Secondary | ICD-10-CM | POA: Diagnosis present

## 2013-06-22 DIAGNOSIS — R634 Abnormal weight loss: Secondary | ICD-10-CM | POA: Diagnosis present

## 2013-06-22 DIAGNOSIS — Z79899 Other long term (current) drug therapy: Secondary | ICD-10-CM

## 2013-06-22 DIAGNOSIS — R7401 Elevation of levels of liver transaminase levels: Secondary | ICD-10-CM | POA: Diagnosis present

## 2013-06-22 DIAGNOSIS — Z853 Personal history of malignant neoplasm of breast: Secondary | ICD-10-CM

## 2013-06-22 DIAGNOSIS — I517 Cardiomegaly: Secondary | ICD-10-CM | POA: Diagnosis present

## 2013-06-22 DIAGNOSIS — E119 Type 2 diabetes mellitus without complications: Secondary | ICD-10-CM | POA: Diagnosis present

## 2013-06-22 DIAGNOSIS — M899 Disorder of bone, unspecified: Secondary | ICD-10-CM | POA: Diagnosis present

## 2013-06-22 DIAGNOSIS — I951 Orthostatic hypotension: Secondary | ICD-10-CM | POA: Diagnosis present

## 2013-06-22 DIAGNOSIS — R000 Tachycardia, unspecified: Secondary | ICD-10-CM

## 2013-06-22 DIAGNOSIS — Z87891 Personal history of nicotine dependence: Secondary | ICD-10-CM

## 2013-06-22 DIAGNOSIS — I2510 Atherosclerotic heart disease of native coronary artery without angina pectoris: Secondary | ICD-10-CM

## 2013-06-22 DIAGNOSIS — IMO0002 Reserved for concepts with insufficient information to code with codable children: Secondary | ICD-10-CM

## 2013-06-22 DIAGNOSIS — R410 Disorientation, unspecified: Secondary | ICD-10-CM | POA: Diagnosis present

## 2013-06-22 DIAGNOSIS — J189 Pneumonia, unspecified organism: Secondary | ICD-10-CM

## 2013-06-22 HISTORY — DX: Other chronic pain: G89.29

## 2013-06-22 HISTORY — DX: Headache: R51

## 2013-06-22 LAB — CBC
HCT: 36.1 % (ref 36.0–46.0)
Hemoglobin: 12.3 g/dL (ref 12.0–15.0)
MCH: 31.9 pg (ref 26.0–34.0)
MCHC: 34.1 g/dL (ref 30.0–36.0)
MCV: 93.8 fL (ref 78.0–100.0)
Platelets: 352 10*3/uL (ref 150–400)
RBC: 3.85 MIL/uL — ABNORMAL LOW (ref 3.87–5.11)
RDW: 14.2 % (ref 11.5–15.5)
WBC: 10.8 10*3/uL — ABNORMAL HIGH (ref 4.0–10.5)

## 2013-06-22 LAB — BASIC METABOLIC PANEL
BUN: 10 mg/dL (ref 6–23)
CO2: 25 mEq/L (ref 19–32)
Calcium: 9 mg/dL (ref 8.4–10.5)
Chloride: 99 mEq/L (ref 96–112)
Creatinine, Ser: 0.46 mg/dL — ABNORMAL LOW (ref 0.50–1.10)
GFR calc Af Amer: >90 mL/min (ref >90)
GFR calc non Af Amer: >90 mL/min (ref >90)
Glucose, Bld: 196 mg/dL — ABNORMAL HIGH (ref 70–99)
Potassium: 3.5 mEq/L (ref 3.5–5.1)
Sodium: 134 mEq/L — ABNORMAL LOW (ref 135–145)

## 2013-06-22 LAB — LACTIC ACID, PLASMA: Lactic Acid, Venous: 1 mmol/L (ref 0.5–2.2)

## 2013-06-22 LAB — LIPASE, BLOOD: Lipase: 15 U/L (ref 11–59)

## 2013-06-22 LAB — PROCALCITONIN: Procalcitonin: <0.1 ng/mL

## 2013-06-22 NOTE — ED Notes (Addendum)
This RN spoke with lab to inquire after a pending lab result. Lab stated that they had not received the tubes of blood. The phlebotomist who sent to the blood stated that she sent the blood immediately after it was collected.

## 2013-06-22 NOTE — ED Notes (Signed)
MD at bedside. 

## 2013-06-22 NOTE — ED Notes (Signed)
Per pt's daughter: pt has been more lethargic and weak than usual. Pt is alert, orientated to person, task only.  States that she is "just tired".

## 2013-06-22 NOTE — ED Provider Notes (Signed)
No headache now feels back to baseline wants to go home with family and they agree.[id number]  Michele Relic, MD 06/22/13 [phone]

## 2013-06-22 NOTE — ED Notes (Signed)
Patient presents to ER via [facility] with c/o weakness, lethargy and headache since yesterday.  Patient was seen at [facility] ED yesterday and EMS states family said they were not given a definitive answer from [facility].  Patient presents tonight with same c/o.  CBG 127

## 2013-06-23 ENCOUNTER — Encounter (HOSPITAL_COMMUNITY): Payer: Self-pay | Admitting: *Deleted

## 2013-06-23 ENCOUNTER — Inpatient Hospital Stay (HOSPITAL_COMMUNITY): Payer: Medicare Other

## 2013-06-23 ENCOUNTER — Observation Stay (HOSPITAL_COMMUNITY): Payer: Medicare Other

## 2013-06-23 ENCOUNTER — Emergency Department (HOSPITAL_COMMUNITY): Payer: Medicare Other

## 2013-06-23 DIAGNOSIS — E119 Type 2 diabetes mellitus without complications: Secondary | ICD-10-CM | POA: Diagnosis not present

## 2013-06-23 DIAGNOSIS — R0989 Other specified symptoms and signs involving the circulatory and respiratory systems: Secondary | ICD-10-CM

## 2013-06-23 DIAGNOSIS — F411 Generalized anxiety disorder: Secondary | ICD-10-CM | POA: Diagnosis not present

## 2013-06-23 DIAGNOSIS — R410 Disorientation, unspecified: Secondary | ICD-10-CM | POA: Diagnosis present

## 2013-06-23 DIAGNOSIS — I2510 Atherosclerotic heart disease of native coronary artery without angina pectoris: Secondary | ICD-10-CM

## 2013-06-23 DIAGNOSIS — R0609 Other forms of dyspnea: Secondary | ICD-10-CM

## 2013-06-23 DIAGNOSIS — K929 Disease of digestive system, unspecified: Secondary | ICD-10-CM | POA: Diagnosis not present

## 2013-06-23 DIAGNOSIS — Z48815 Encounter for surgical aftercare following surgery on the digestive system: Secondary | ICD-10-CM | POA: Diagnosis not present

## 2013-06-23 DIAGNOSIS — I10 Essential (primary) hypertension: Secondary | ICD-10-CM | POA: Diagnosis not present

## 2013-06-23 DIAGNOSIS — R634 Abnormal weight loss: Secondary | ICD-10-CM | POA: Diagnosis present

## 2013-06-23 DIAGNOSIS — I959 Hypotension, unspecified: Secondary | ICD-10-CM

## 2013-06-23 DIAGNOSIS — E860 Dehydration: Secondary | ICD-10-CM

## 2013-06-23 DIAGNOSIS — R404 Transient alteration of awareness: Secondary | ICD-10-CM

## 2013-06-23 DIAGNOSIS — F329 Major depressive disorder, single episode, unspecified: Secondary | ICD-10-CM | POA: Diagnosis not present

## 2013-06-23 DIAGNOSIS — E871 Hypo-osmolality and hyponatremia: Principal | ICD-10-CM

## 2013-06-23 DIAGNOSIS — R000 Tachycardia, unspecified: Secondary | ICD-10-CM

## 2013-06-23 DIAGNOSIS — R7989 Other specified abnormal findings of blood chemistry: Secondary | ICD-10-CM

## 2013-06-23 DIAGNOSIS — I471 Supraventricular tachycardia: Secondary | ICD-10-CM

## 2013-06-23 DIAGNOSIS — IMO0001 Reserved for inherently not codable concepts without codable children: Secondary | ICD-10-CM

## 2013-06-23 DIAGNOSIS — F3289 Other specified depressive episodes: Secondary | ICD-10-CM | POA: Diagnosis not present

## 2013-06-23 HISTORY — DX: Disorientation, unspecified: R41.0

## 2013-06-23 LAB — URINALYSIS W MICROSCOPIC + REFLEX CULTURE
Bilirubin Urine: NEGATIVE
Glucose, UA: NEGATIVE mg/dL
Ketones, ur: 15 mg/dL — AB
Nitrite: NEGATIVE
Protein, ur: NEGATIVE mg/dL
Specific Gravity, Urine: 1.025 (ref 1.005–1.030)
Urobilinogen, UA: 0.2 mg/dL (ref 0.0–1.0)
pH: 6 (ref 5.0–8.0)

## 2013-06-23 LAB — HEPATIC FUNCTION PANEL
ALT: 39 U/L — ABNORMAL HIGH (ref 0–35)
AST: 23 U/L (ref 0–37)
Albumin: 3.6 g/dL (ref 3.5–5.2)
Alkaline Phosphatase: 359 U/L — ABNORMAL HIGH (ref 39–117)
Bilirubin, Direct: 0.2 mg/dL (ref 0.0–0.3)
Indirect Bilirubin: 0.6 mg/dL (ref 0.3–0.9)
Total Bilirubin: 0.8 mg/dL (ref 0.3–1.2)
Total Protein: 7.5 g/dL (ref 6.0–8.3)

## 2013-06-23 LAB — CBC WITH DIFFERENTIAL/PLATELET
Basophils Absolute: 0.1 10*3/uL (ref 0.0–0.1)
Basophils Relative: 1 % (ref 0–1)
Eosinophils Absolute: 0.1 10*3/uL (ref 0.0–0.7)
Eosinophils Relative: 1 % (ref 0–5)
HCT: 35.7 % — ABNORMAL LOW (ref 36.0–46.0)
Hemoglobin: 12.1 g/dL (ref 12.0–15.0)
Lymphocytes Relative: 33 % (ref 12–46)
Lymphs Abs: 2.2 10*3/uL (ref 0.7–4.0)
MCH: 32.3 pg (ref 26.0–34.0)
MCHC: 33.9 g/dL (ref 30.0–36.0)
MCV: 95.2 fL (ref 78.0–100.0)
Monocytes Absolute: 0.3 10*3/uL (ref 0.1–1.0)
Monocytes Relative: 5 % (ref 3–12)
Neutro Abs: 4 10*3/uL (ref 1.7–7.7)
Neutrophils Relative %: 60 % (ref 43–77)
Platelets: 346 10*3/uL (ref 150–400)
RBC: 3.75 MIL/uL — ABNORMAL LOW (ref 3.87–5.11)
RDW: 14.2 % (ref 11.5–15.5)
WBC: 6.6 10*3/uL (ref 4.0–10.5)

## 2013-06-23 LAB — COMPREHENSIVE METABOLIC PANEL
ALT: 42 U/L — ABNORMAL HIGH (ref 0–35)
AST: 28 U/L (ref 0–37)
Albumin: 3.4 g/dL — ABNORMAL LOW (ref 3.5–5.2)
Alkaline Phosphatase: 350 U/L — ABNORMAL HIGH (ref 39–117)
BUN: 10 mg/dL (ref 6–23)
CO2: 24 mEq/L (ref 19–32)
Calcium: 9.1 mg/dL (ref 8.4–10.5)
Chloride: 94 mEq/L — ABNORMAL LOW (ref 96–112)
Creatinine, Ser: 0.48 mg/dL — ABNORMAL LOW (ref 0.50–1.10)
GFR calc Af Amer: >90 mL/min (ref >90)
GFR calc non Af Amer: >90 mL/min (ref >90)
Glucose, Bld: 168 mg/dL — ABNORMAL HIGH (ref 70–99)
Potassium: 3.6 mEq/L (ref 3.5–5.1)
Sodium: 131 mEq/L — ABNORMAL LOW (ref 135–145)
Total Bilirubin: 0.9 mg/dL (ref 0.3–1.2)
Total Protein: 7.4 g/dL (ref 6.0–8.3)

## 2013-06-23 LAB — URINE CULTURE: Colony Count: >=100000

## 2013-06-23 LAB — HEMOGLOBIN A1C
Hgb A1c MFr Bld: 6.5 % — ABNORMAL HIGH (ref <5.7)
Mean Plasma Glucose: 140 mg/dL — ABNORMAL HIGH (ref <117)

## 2013-06-23 LAB — GLUCOSE, CAPILLARY
Glucose-Capillary: 213 mg/dL — ABNORMAL HIGH (ref 70–99)
Glucose-Capillary: 127 mg/dL — ABNORMAL HIGH (ref 70–99)
Glucose-Capillary: 120 mg/dL — ABNORMAL HIGH (ref 70–99)
Glucose-Capillary: 124 mg/dL — ABNORMAL HIGH (ref 70–99)

## 2013-06-23 LAB — AMMONIA: Ammonia: 28 umol/L (ref 11–60)

## 2013-06-23 LAB — BASIC METABOLIC PANEL
BUN: 8 mg/dL (ref 6–23)
CO2: 21 mEq/L (ref 19–32)
Calcium: 9.3 mg/dL (ref 8.4–10.5)
Chloride: 92 mEq/L — ABNORMAL LOW (ref 96–112)
Creatinine, Ser: 0.47 mg/dL — ABNORMAL LOW (ref 0.50–1.10)
GFR calc Af Amer: >90 mL/min (ref >90)
GFR calc non Af Amer: >90 mL/min (ref >90)
Glucose, Bld: 171 mg/dL — ABNORMAL HIGH (ref 70–99)
Potassium: 3.6 mEq/L (ref 3.5–5.1)
Sodium: 130 mEq/L — ABNORMAL LOW (ref 135–145)

## 2013-06-23 LAB — TSH: TSH: 1.022 u[IU]/mL (ref 0.350–4.500)

## 2013-06-23 LAB — MRSA PCR SCREENING: MRSA by PCR: NEGATIVE

## 2013-06-23 LAB — TROPONIN I: Troponin I: <0.3 ng/mL (ref <0.30)

## 2013-06-23 LAB — LIPASE, BLOOD: Lipase: 16 U/L (ref 11–59)

## 2013-06-23 LAB — LACTIC ACID, PLASMA: Lactic Acid, Venous: 1.1 mmol/L (ref 0.5–2.2)

## 2013-06-23 LAB — MAGNESIUM: Magnesium: 1.3 mg/dL — ABNORMAL LOW (ref 1.5–2.5)

## 2013-06-23 LAB — GAMMA GT: GGT: 464 U/L — ABNORMAL HIGH (ref 7–51)

## 2013-06-23 MED ORDER — ENOXAPARIN SODIUM 30 MG/0.3ML ~~LOC~~ SOLN
30.0000 mg | SUBCUTANEOUS | Status: DC
  Filled 2013-06-23: qty 0.3

## 2013-06-23 MED ORDER — MIRTAZAPINE 15 MG PO TBDP
15.0000 mg | ORAL_TABLET | Freq: Every day | ORAL | Status: DC
  Administered 2013-06-23: 15 mg via ORAL
  Filled 2013-06-23 (×6): qty 1

## 2013-06-23 MED ORDER — POLYETHYLENE GLYCOL 3350 17 G PO PACK
17.0000 g | PACK | Freq: Every day | ORAL | Status: DC | PRN
  Filled 2013-06-23: qty 1

## 2013-06-23 MED ORDER — MIDODRINE HCL 5 MG PO TABS
5.0000 mg | ORAL_TABLET | Freq: Three times a day (TID) | ORAL | Status: DC
  Filled 2013-06-23 (×2): qty 1

## 2013-06-23 MED ORDER — BUTALBITAL-APAP-CAFFEINE 50-325-40 MG PO TABS: 2.0000 | ORAL_TABLET | ORAL | Status: DC | PRN

## 2013-06-23 MED ORDER — SODIUM CHLORIDE 0.9 % IJ SOLN
10.0000 mL | Freq: Two times a day (BID) | INTRAMUSCULAR | Status: DC
  Administered 2013-06-23 – 2013-06-26 (×6): 10 mL

## 2013-06-23 MED ORDER — SODIUM CHLORIDE 0.9 % IJ SOLN: 10.0000 mL | INTRAMUSCULAR | Status: DC | PRN

## 2013-06-23 MED ORDER — PANTOPRAZOLE SODIUM 40 MG PO PACK
40.0000 mg | PACK | Freq: Every day | ORAL | Status: DC
  Administered 2013-06-23 – 2013-06-27 (×5): 40 mg via ORAL
  Filled 2013-06-23 (×8): qty 20

## 2013-06-23 MED ORDER — DEXTROSE 5 % IV SOLN
2.0000 g | Freq: Once | INTRAVENOUS | Status: AC
  Administered 2013-06-23: 2 g via INTRAVENOUS
  Filled 2013-06-23: qty 2

## 2013-06-23 MED ORDER — OMEGA-3-ACID ETHYL ESTERS 1 G PO CAPS
2.0000 | ORAL_CAPSULE | Freq: Every morning | ORAL | Status: DC
  Administered 2013-06-23 – 2013-06-27 (×5): 2 g via ORAL
  Filled 2013-06-23 (×5): qty 2

## 2013-06-23 MED ORDER — ENOXAPARIN SODIUM 40 MG/0.4ML ~~LOC~~ SOLN
40.0000 mg | SUBCUTANEOUS | Status: DC
  Administered 2013-06-23 – 2013-06-27 (×5): 40 mg via SUBCUTANEOUS
  Filled 2013-06-23 (×5): qty 0.4

## 2013-06-23 MED ORDER — METOPROLOL SUCCINATE ER 25 MG PO TB24
25.0000 mg | ORAL_TABLET | Freq: Once | ORAL | Status: AC
  Administered 2013-06-23: 25 mg via ORAL
  Filled 2013-06-23: qty 1

## 2013-06-23 MED ORDER — INSULIN ASPART 100 UNIT/ML ~~LOC~~ SOLN
0.0000 [IU] | Freq: Every day | SUBCUTANEOUS | Status: DC
  Administered 2013-06-25 – 2013-06-26 (×2): 3 [IU] via SUBCUTANEOUS

## 2013-06-23 MED ORDER — POTASSIUM CHLORIDE IN NACL 20-0.9 MEQ/L-% IV SOLN
INTRAVENOUS | Status: DC
  Administered 2013-06-23 – 2013-06-24 (×4): via INTRAVENOUS
  Administered 2013-06-24: 100 mL/h via INTRAVENOUS
  Administered 2013-06-25 – 2013-06-26 (×3): via INTRAVENOUS

## 2013-06-23 MED ORDER — VANCOMYCIN HCL IN DEXTROSE 1-5 GM/200ML-% IV SOLN
1000.0000 mg | Freq: Once | INTRAVENOUS | Status: AC
  Administered 2013-06-23: 1000 mg via INTRAVENOUS
  Filled 2013-06-23: qty 200

## 2013-06-23 MED ORDER — FLEET ENEMA 7-19 GM/118ML RE ENEM: 1.0000 | ENEMA | Freq: Once | RECTAL | Status: AC | PRN

## 2013-06-23 MED ORDER — ACETAMINOPHEN 500 MG PO TABS: 500.0000 mg | ORAL_TABLET | Freq: Four times a day (QID) | ORAL | Status: DC | PRN

## 2013-06-23 MED ORDER — INSULIN ASPART 100 UNIT/ML ~~LOC~~ SOLN
0.0000 [IU] | Freq: Three times a day (TID) | SUBCUTANEOUS | Status: DC
  Administered 2013-06-23: 3 [IU] via SUBCUTANEOUS
  Administered 2013-06-24: 1 [IU] via SUBCUTANEOUS
  Administered 2013-06-24: 7 [IU] via SUBCUTANEOUS
  Administered 2013-06-25: 1 [IU] via SUBCUTANEOUS
  Administered 2013-06-25: 7 [IU] via SUBCUTANEOUS
  Administered 2013-06-26 (×2): 2 [IU] via SUBCUTANEOUS
  Administered 2013-06-26: 5 [IU] via SUBCUTANEOUS
  Administered 2013-06-27: 3 [IU] via SUBCUTANEOUS

## 2013-06-23 MED ORDER — ONDANSETRON HCL 4 MG/2ML IJ SOLN: 4.0000 mg | INTRAMUSCULAR | Status: DC | PRN

## 2013-06-23 MED ORDER — METOPROLOL SUCCINATE ER 50 MG PO TB24
50.0000 mg | ORAL_TABLET | Freq: Every day | ORAL | Status: DC
  Administered 2013-06-24 – 2013-06-27 (×4): 50 mg via ORAL
  Filled 2013-06-23 (×4): qty 1

## 2013-06-23 MED ORDER — SODIUM CHLORIDE 0.9 % IV SOLN: INTRAVENOUS | Status: DC

## 2013-06-23 MED ORDER — METOPROLOL SUCCINATE ER 25 MG PO TB24
25.0000 mg | ORAL_TABLET | Freq: Every day | ORAL | Status: DC
  Administered 2013-06-23: 25 mg via ORAL
  Filled 2013-06-23: qty 1

## 2013-06-23 NOTE — Consult Note (Signed)
Referring Provider: [name], MD Primary Care Physician:  [name], MD Primary Gastroenterologist:  Barney Drain, MD Reason for Consultation:  Abnormal LFTs  HPI: [name] is a 79 y.o. female with known history of nonobstructive coronary artery disease, diabetes, hypertension, hyperlipidemia  admitted with mental status changes consisting of aggressive behavior, confusion, initially associated with vomiting, abdominal pain, low-grade fever, FTT. She has been hospitalized on multiple occasions recently. Discharged on 06/15/2013 after presenting with tachycardia. Hospitalized earlier in July as well with similar issue use of hypotension and tachycardia. Hospitalized in June for postoperative ileus, status post microscopic appendectomy back in May.   We have a consult regarding abnormal LFTs. AST, ALT, alkaline phosphatase have been intermittently elevated since the latter part of July. Actually alkphos was 345 on 04/24/13 as well. Abdominal ultrasound done 06/15/2013. CBD was normal. Prior cholecystectomy. CT of the abdomen and pelvis with contrast on 04/17/2013 showed mild post cholecystectomy intrahepatic biliary ductal dilation but normal common bile duct.  History provided by son as patient remains confused. He reports that prior to appendectomy, patient was driving, very functional, great appetite. Since 04/2013, with ileus, she has never regained her appetite or functionality. The last several weeks, they have had increasing difficulty getting her to eat and drink. She had no BM for four days but finally able to have a BM. Wednesday she developed headache, N/V, evaluated in ER at [facility]. Patient has not complained of heartburn, dysphagia, abdominal pain, melena, brbpr. She has never had EGD. According to her son, she was able to take her medications up until Wednesday when she developed vomiting and mental status changes. He denies h/o CVA.   Prior to Admission medications   Medication Sig  Start Date End Date Taking? Authorizing Provider  acetaminophen (TYLENOL) 500 MG tablet Take 500 mg by mouth every 6 (six) hours as needed for pain.   Yes Historical Provider, MD  ALPRAZolam (NIRAVAM) 0.25 MG dissolvable tablet Take 0.25 mg by mouth at bedtime as needed.     Yes Historical Provider, MD  aspirin 325 MG tablet Take 325 mg by mouth daily.     Yes Historical Provider, MD  Calcium Carbonate-Vit D-Min 600-400 MG-UNIT TABS Take 1 tablet by mouth every morning.   Yes Historical Provider, MD  cholecalciferol (VITAMIN D) 1000 UNITS tablet Take 1,000 Units by mouth every morning.    Yes Historical Provider, MD  Coenzyme Q10 (CO Q 10) 100 MG CAPS Take 100 mg by mouth every morning.    Yes Historical Provider, MD  docusate sodium (COLACE) 100 MG capsule Take 100 mg by mouth 2 (two) times daily.   Yes Historical Provider, MD  esomeprazole (NEXIUM) 40 MG capsule Take 40 mg by mouth at bedtime.    Yes Historical Provider, MD  fish oil-omega-3 fatty acids 1000 MG capsule Take 2 g by mouth every morning.    Yes Historical Provider, MD  glyBURIDE-metformin (GLUCOVANCE) 5-500 MG per tablet Take 2 tablets by mouth 2 (two) times daily with a meal.    Yes Historical Provider, MD  insulin glargine (LANTUS) 100 UNIT/ML injection Inject 2-8 Units into the skin at bedtime. Give if BS 140-150= 3 units, 151-160=4 units, 161-170=5 units, 171-180= 6 units, >181=7units   Yes Historical Provider, MD  metoprolol succinate (TOPROL-XL) 25 MG 24 hr tablet Take 1 tablet (25 mg total) by mouth daily. 06/15/13  Yes [name], MD  midodrine (PROAMATINE) 5 MG tablet Take 1 tablet (5 mg total) by mouth 3 (three) times  daily with meals. 06/15/13  Yes [name], MD  mirtazapine (REMERON SOL-TAB) 15 MG disintegrating tablet Take 1 tablet (15 mg total) by mouth at bedtime. 06/15/13  Yes [name], MD  polyethylene glycol (MIRALAX / GLYCOLAX) packet Take 17 g by mouth daily as needed (Constipation).   Yes Historical  Provider, MD  spironolactone (ALDACTONE) 12.5 mg TABS Take 0.5 tablets (12.5 mg total) by mouth daily. 06/15/13  Yes [name], MD  tiotropium (SPIRIVA) 18 MCG inhalation capsule Place 18 mcg into inhaler and inhale daily as needed (shortness of breath).    Historical Provider, MD    Current Facility-Administered Medications  Medication Dose Route Frequency Provider Last Rate Last Dose  . 0.9 % NaCl with KCl 20 mEq/ L  infusion   Intravenous Continuous Karlyn Agee, MD 125 mL/hr at 06/23/13 0800    . acetaminophen (TYLENOL) tablet 500 mg  500 mg Oral Q6H PRN Karlyn Agee, MD      . enoxaparin (LOVENOX) injection 40 mg  40 mg Subcutaneous Q24H [name], MD   40 mg at 06/23/13 1018  . insulin aspart (novoLOG) injection 0-5 Units  0-5 Units Subcutaneous QHS Karlyn Agee, MD      . insulin aspart (novoLOG) injection 0-9 Units  0-9 Units Subcutaneous TID WC Karlyn Agee, MD   3 Units at 06/23/13 0757  . [START ON 06/24/2013] metoprolol succinate (TOPROL-XL) 24 hr tablet 50 mg  50 mg Oral Daily [name], MD      . mirtazapine (REMERON SOL-TAB) disintegrating tablet 15 mg  15 mg Oral QHS Karlyn Agee, MD      . omega-3 acid ethyl esters (LOVAZA) capsule 2 g  2 capsule Oral q morning - 10a Karlyn Agee, MD   2 g at 06/23/13 1015  . ondansetron (ZOFRAN) injection 4 mg  4 mg Intravenous Q4H PRN Karlyn Agee, MD      . pantoprazole sodium (PROTONIX) 40 mg/20 mL oral suspension 40 mg  40 mg Oral Daily Karlyn Agee, MD   40 mg at 06/23/13 1015  . polyethylene glycol (MIRALAX / GLYCOLAX) packet 17 g  17 g Oral Daily PRN Karlyn Agee, MD      . sodium phosphate (FLEET) 7-19 GM/118ML enema 1 enema  1 enema Rectal Once PRN Karlyn Agee, MD        Allergies as of 06/22/2013 - Review Complete 06/22/2013  Allergen Reaction Noted  . Penicillins Rash 01/11/2007    Past Medical History  Diagnosis Date  . Hemorrhage of gastrointestinal tract, unspecified   .  Other malaise and fatigue   . Tobacco use disorder   . Parotid tumor   . Neoplasm of malignant     breast  ,HX   . Diabetes mellitus   . Osteopenia   . Hypertension   . Hyperlipidemia   . Depression   . Anxiety state, unspecified   . Chronic headaches     Past Surgical History  Procedure Laterality Date  . Mastectomy  1994    left breast -related to breast cancer  . Vesicovaginal fistula closure w/ tah  1979  . Cholecystectomy  2008  . Hemorrhoid surgery  1960s  . Laparoscopic appendectomy N/A 04/13/2013    Procedure: APPENDECTOMY LAPAROSCOPIC;  Surgeon: Donato Heinz, MD;  Location: [facility];  Service: General;  Laterality: N/A;  . Colonoscopy  2010    Dr. Oneida Alar: single tubular adenoma, one hyperplastic polyp. Next TCS 2020 health permitting.    Family History  Problem Relation  Age of Onset  . Heart failure Mother     CHF  . Cirrhosis Brother   . Lung cancer Father     brain mets  . Kidney cancer Mother   . Kidney cancer Sister     History   Social History  . Marital Status: Divorced    Spouse Name: N/A    Number of Children: N/A  . Years of Education: N/A   Occupational History  . RETIRED FACTORY WORKER     [employer]   Social History Main Topics  . Smoking status: Former Smoker -- 0.50 packs/day for 40 years    Types: Cigarettes    Quit date: 03/30/2013  . Smokeless tobacco: Not on file  . Alcohol Use: No  . Drug Use: No  . Sexually Active: No   Other Topics Concern  . 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 Started age 71.   Drug use -no   Alcohol use - no   Lives with son   Education - 12 th grade                    ROS: patient's history unreliable.        Physical Examination: Vital signs in last 24 hours: Temp:  [99.2 F (37.3  C)-99.6 F (37.6 C)] 99.4 F (37.4 C) (08/08 0800) Pulse Rate:  [80-143] 143 (08/08 0805) Resp:  [16-27] 22 (08/08 1000) BP: (95-196)/(63-92) 95/63 mmHg (08/08 0805) SpO2:  [94 %-99 %] 95 % (08/08 0800) Weight:  [129 lb 3 oz (58.6 kg)-135 lb (61.236 kg)] 129 lb 3 oz (58.6 kg) (08/08 0400) Last BM Date: 06/21/13  General: Thin, female in no acute distress. Son at bedside.  Head: Normocephalic, atraumatic.   Eyes: Conjunctiva pink, no icterus. Mouth: Oropharyngeal mucosa moist and pink , no lesions erythema or exudate. Neck: Supple without thyromegaly, masses, or lymphadenopathy.  Lungs: Clear to auscultation bilaterally.  Heart: Regular rate and rhythm, no murmurs rubs or gallops.  Abdomen: Bowel sounds are normal, nontender, nondistended, no hepatosplenomegaly or masses, no abdominal bruits or    hernia , no rebound or guarding.   Rectal: not performed Extremities: No lower extremity edema, clubbing, deformity.  Neuro: Alert and oriented to self. Does not know her DOB or where she is.   Skin: Warm and dry, no rash or jaundice.   Psych: Alert and cooperative, normal mood and affect.        Intake/Output from previous day: 08/07 0701 - 08/08 0700 In: 408.3 [I.V.:208.3; IV Piggyback:200] Out: -  Intake/Output this shift: Total I/O In: 125 [I.V.:125] Out: 200 [Urine:200]  Lab Results: CBC  Recent Labs  06/21/13 2204 06/22/13 2347  WBC 10.8* 6.6  HGB 12.3 12.1  HCT 36.1 35.7*  MCV 93.8 95.2  PLT 352 346   BMET  Recent Labs  06/21/13 2204 06/22/13 2350 06/23/13 0901  NA 134* 131* 130*  K 3.5 3.6 3.6  CL 99 94* 92*  CO2 _0 GLUCOSE 196* 168* 171*  BUN _1 CREATININE 0.46* 0.48* 0.47*  CALCIUM 9.0 9.1 9.3   LFT  Recent Labs  06/22/13 2350 06/23/13 0901  BILITOT 0.9 0.8  BILIDIR  --  0.2  IBILI  --  0.6  ALKPHOS 350* 359*  AST 28 23  ALT 42* 39*  PROT 7.4 7.5  ALBUMIN 3.4* 3.6   Component     Latest Ref Rng 04/20/2013 04/24/2013 05/30/2013  05/31/2013 06/12/2013  AST     0 - 37 U/L 16 59 (H) 27 22 48 (H)  ALT     0 - 35 U/L _0 47 (H)  Alkaline Phosphatase     39 - 117 U/L 85 345 (H) 128 (H) 113 298 (H)  Total Bilirubin     0.3 - 1.2 mg/dL 0.4 0.4 0.6 0.7 0.6   Component     Latest Ref Rng 06/14/2013 06/15/2013 06/22/2013 06/23/2013  AST     0 - 37 U/L 89 (H) 172 (H) 28 23  ALT     0 - 35 U/L 81 (H) 137 (H) 42 (H) 39 (H)  Alkaline Phosphatase     39 - 117 U/L 392 (H) 507 (H) 350 (H) 359 (H)  Total Bilirubin     0.3 - 1.2 mg/dL 0.9 0.8 0.9 0.8   Lab Results  Component Value Date   LIPASE 16 06/22/2013   Lab Results  Component Value Date   TSH 1.086 06/12/2013     PT/INR No results found for this basename: LABPROT, INR,  in the last 72 hours    Imaging Studies: Dg Chest 2 View  06/23/2013   *RADIOLOGY REPORT*  Clinical Data: Headache and weakness.  Slurred speech.  CHEST - 2 VIEW  Comparison: CT chest 03/29/2010 and portable chest 06/21/2013.  Findings: The lungs are emphysematous.  There is cardiomegaly and vascular congestion.  Right lower lobe airspace disease is identified.  IMPRESSION:  1.  Right lower lobe airspace disease worrisome for pneumonia. 2.  Cardiomegaly and vascular congestion. 3.  Emphysema.   Original Report Authenticated By: Orlean Patten, M.D.    Ct Head Wo Contrast  06/23/2013   *RADIOLOGY REPORT*  Clinical Data: Weakness, fatigue, headache  CT HEAD WITHOUT CONTRAST  Technique:  Contiguous axial images were obtained from the base of the skull through the vertex without contrast.  Comparison: 06/21/2013  Findings: Age-related brain atrophy.  Periventricular chronic microvascular changes, worse on the left.  No acute intracranial hemorrhage, mass lesion, infarction, midline shift, herniation, or extra-axial fluid collection.  Cisterns patent.  No cerebellar abnormality.  Orbits unremarkable.  Mastoids and sinuses clear.  IMPRESSION: Stable atrophy and microvascular changes.   Original Report  Authenticated By: Jerilynn Mages. Annamaria Boots, M.D.       Ct Chest Wo Contrast  06/23/2013   *RADIOLOGY REPORT*  Clinical Data: Weakness and fatigue.  Possible pneumonia but chest x-ray.  CT CHEST WITHOUT CONTRAST  Technique:  Multidetector CT imaging of the chest was performed following the standard protocol without IV contrast.  Comparison: PA and lateral chest earlier this same day and CT chest 03/29/2010.  Findings: There is no axillary, hilar or mediastinal lymphadenopathy.  Heart size is mildly enlarged.  No pleural or pericardial effusion is seen.  Emphysematous change is seen in the lung apices.  No consolidative process is identified.  Mild dependent atelectasis is noted.  Visualized upper abdomen is unremarkable.  No focal bony abnormality is identified.  IMPRESSION:  1.  Negative for pneumonia. 2.  Emphysema. 3.  Mild cardiomegaly.   Original Report Authenticated By: Orlean Patten, M.D.   Ct Cervical Spine Wo Contrast  06/15/2013   *RADIOLOGY REPORT*  Clinical Data:  Patient fell while walking down the stairs.  Right neck soreness.  CT HEAD WITHOUT CONTRAST CT CERVICAL SPINE WITHOUT CONTRAST  Technique:  Multidetector CT imaging of the head and cervical spine was performed following the standard protocol without intravenous contrast.  Multiplanar CT image reconstructions of the cervical spine were also generated.  Comparison:  CT brain 06/12/2013; MR brain 06/12/2013.  CT HEAD  Findings: Mild periventricular white matter hypodensity compatible with chronic small vessel ischemic change.  No mass lesion, hemorrhage, or acute infarction. Unchanged calcified lesion within the right frontal region.  The mastoid air cells are well-aerated. Paranasal sinuses are unremarkable.  Visualized aspect of the globes are intact. Mild soft tissue swelling overlying the right posterior aspect of the skull without underlying fracture.  IMPRESSION:  Chronic ischemic white matter disease without acute intracranial abnormality.  CT  CERVICAL SPINE  Findings: Normal anatomic alignment of the cervical spine without evidence for acute fracture.  Craniocervical junction is intact. Mild multilevel degenerative disc and facet disease most pronounced at C3-4.  Lungs demonstrate no significant interval change biapical emphysema with right greater than left apical scarring dating back to 2011.  There is a 2.1 x 1.7 cm soft tissue mass within the right parotid gland, better characterized on prior MRI.  IMPRESSION: 1. No evidence for acute cervical spine fracture.  2.  Multilevel degenerative changes.  3. Right parotid gland soft tissue mass, better characterized on prior MRI.   Original Report Authenticated By: Lovey Newcomer, M.D   Mr [facility] Wo Contrast  06/12/2013   *RADIOLOGY REPORT*  Clinical Data: Leg weakness.  Rule out basilar stenosis  MRA HEAD WITHOUT CONTRAST  Technique: Angiographic images of the Circle of Willis were obtained using MRA technique without intravenous contrast.  Comparison: MRI brain today.  CT head 06/04/2013  Findings: Both vertebral arteries are widely patent.  Basilar artery is widely patent.  Vertebral arteries are similar in size of relatively large in size to severe anterior circulation disease. The posterior cerebral arteries are patent bilaterally.  The right internal carotid artery is occluded through the cavernous segment and reconstitutes via of the right posterior communicating artery which is a prominent vessel.  The anterior and middle cerebral arteries are patent on the right.  The left internal carotid artery is probably patent but appears small with low flow.  This suggests a severe stenosis of the left internal carotid artery in the neck.  The left posterior communicator artery is patent and supplies the anterior circulation.  The left middle cerebral artery is small due to diffuse disease.  There is decreased perfusion of left middle cerebral artery territory.  There is a prominent left ophthalmic artery  which is most likely a collateral source of circulation to the left internal carotid artery.  Negative for aneurysm.  IMPRESSION: Occlusion of the right internal carotid artery.  The right anterior and middle cerebral arteries are supplied via the right posterior communicating artery.  The left internal carotid artery shows decreased flow and decreased caliber due to slow flow suggesting a severe proximal internal carotid artery stenosis in the neck. Carotid Doppler suggest left internal carotid artery occlusion.  Consider CTA of the head and neck for further evaluation to determine severe stenosis versus occlusion.  In addition, there appears to be atherosclerotic disease throughout the left M1 segment.  Widely patent posterior circulation.   Original Report Authenticated By: Carl Best, M.D.   Mr [facility] SP Contrast  06/12/2013   *RADIOLOGY REPORT*  Clinical Data: Leg weakness.  Hypertension diabetes and hyperlipidemia  MRI HEAD WITHOUT AND WITH CONTRAST  Technique:  Multiplanar, multiecho pulse sequences of the brain and surrounding structures were obtained according to standard protocol without and with intravenous contrast  Contrast: 43m MULTIHANCE GADOBENATE DIMEGLUMINE 529 MG/ML IV SOLN  Comparison: CT 06/12/2013  Findings: Negative for acute infarct.  Chronic microvascular ischemic changes are present, asymmetric on the left.  Small chronic infarct left frontal lobe.  Brainstem and cerebellum are negative.  Negative for intracranial hemorrhage or mass lesion.  Age appropriate atrophy.  Negative for hydrocephalus.  Soft tissue mass in the right posterior parotid gland measures 16 x 30 mm on sagittal images.  This appears larger compared with 2007 MRI of the neck which also identified this lesion.  Postcontrast imaging reveals normal enhancement.  No intracranial mass lesion is identified.  IMPRESSION: Atrophy and chronic microvascular ischemia, asymmetric on the left suggestive of chronic MCA ischemia.   Mass lesion in the right posterior parotid gland, enlarged in 2007. 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   [place]dom[place] Complete  06/15/2013   *RADIOLOGY REPORT*  Clinical Data:  Elevated LFTs, diabetes, hypertension, prior cholecystectomy  ABDOMINAL ULTRASOUND COMPLETE  Comparison:  62,014  Findings:  Gallbladder:  Prior cholecystectomy.  Common Bile Duct:  Within normal limits in caliber.  Liver: No focal mass lesion identified.  Within normal limits in parenchymal echogenicity. Patent portal vein with normal hepato pedal flow.  IVC:  Appears normal.  Pancreas:  No abnormality identified.  Spleen:  Within normal limits in size and echotexture.  Right kidney:  Normal in size and parenchymal echogenicity.  No evidence of mass or hydronephrosis. Minor pelvic fullness noted.  Left kidney:  Normal in size and parenchymal echogenicity.  No evidence of mass or hydronephrosis.  Incidental upper pole 10 mm cyst noted.  Abdominal Aorta:  No aneurysm identified.  IMPRESSION: Prior cholecystectomy.  No acute finding by ultrasound.   Original Report Authenticated By: MJerilynn Mages SAnnamaria Boots M.D.   [place]ot[place] Duplex Bilateral  06/12/2013   *RADIOLOGY REPORT*  Clinical Data: TIA symptoms  BILATERAL CAROTID DUPLEX ULTRASOUND  Technique: GPearline Cablesscale imaging, color Doppler and duplex ultrasound were performed of bilateral carotid and vertebral arteries in the neck.  Comparison:  None.  Criteria:  Quantification of carotid stenosis is based on velocity parameters that correlate the residual internal carotid diameter with NASCET-based stenosis levels, using the diameter of the distal internal carotid lumen as the denominator for stenosis measurement.  The following velocity measurements were obtained:                   PEAK SYSTOLIC/END DIASTOLIC RIGHT ICA:                        Occludedcm/sec CCA:                        337/1IR/CVESYSTOLIC ICA/CCA RATIO:     Not applicable DIASTOLIC ICA/CCA RATIO:     Not applicable ECA:                        71cm/sec  LEFT ICA:                        Occludedcm/sec CCA:                        393/8BO/FBPSYSTOLIC ICA/CCA  RATIO:     Not applicable DIASTOLIC ICA/CCA RATIO:    Not applicable ECA:                        61cm/sec  Findings:  RIGHT CAROTID ARTERY: The internal carotid artery is occluded just beyond its origin  RIGHT VERTEBRAL ARTERY:  Antegrade  LEFT CAROTID ARTERY: The internal carotid artery is occluded just beyond its origin.  LEFT VERTEBRAL ARTERY:  Antegrade  IMPRESSION: Bilateral internal carotid artery occlusion.   Original Report Authenticated By: Inez Catalina, M.D.      Impression: 79 y/o female with diminished oral intake, anorexia, mental status changes, hyponatremia, abnormal LFTs. Intermittent abnormal LFTs, more pronounced latter part of 06/20/2023. CT and U/S imaging unremarkable. No prior EGD. Unable to obtain history from patient given mental status changes. Son states patient has never fully recovered since her appendectomy.  For abnormal LFTs, will check serologies. Patient may benefit from EGD to rule out PUD. Agree with neurology consultation.   Low grade fever. Blood cultures pending. Urine culture unremarkable. Chest CT without pneumonia. No current antibiotics.  Plan: 1. LFTs am, serologies to be added to labs. 2. PPI.  3. To discuss further with Dr. Oneida Alar regarding possible EGD for failure to thrive, diminished oral intake.   LOS: 1 day   Neil Crouch  06/23/2013, 11:58 AM

## 2013-06-23 NOTE — Consult Note (Signed)
[place] A. Merlene Laughter, MD     [email or web address]          [name] is an 79 y.o. female.   ASSESSMENT/PLAN: 1. Acute confusion. I suspect that the patient most likely has a metabolic encephalopathy. She does seem to have some suggestion of pneumonia on chest x-ray. She does not seem to have an acute infarct on imaging to explain the encephalopathy. Repeat scan has been negative. His unlikely that a significant infarct can explain the patient's encephalopathy. 2. 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 3. Orthostatic hypertension presented as gait instability, dizziness and leg weakness. So far she appears to have done well the current dose of ProAmatine. Adjustments can be made if needed however. 4. Headaches. The patient will be treated with Fioricet for symptomatic relief.     The patient is a 79 year old black female who has had multiple hospital admissions for various reasons. She currently presents with increasing confusion over the past day or 2. Most recently, she presented with weakness of the upper extremities and legs associated with severe extracranial carotid disease. Fact, she's had carotid occlusion. This suspect that the occlusion occurred gradually with possibly subacute to acute closer recently. The patient also has had orthostatic hypotension. The patient was treated with ProAmatine and seemed to have done well. She presents with a day history of confusion. She continues to have some leg weakness and instability and ambulating but she was able to use a walker. With the confusion however she's had some increasing leg weakness requiring a wheelchair. The daughter reports that the patient has had a lot of complaints of headaches. The patient's confusion have improved modestly since she has been admitted. She does not report complaints at this time. The review of systems otherwise unremarkable.  GENERAL: This is a thin  pleasant lady in no acute distress.  HEENT: Supple. Atraumatic normocephalic.   ABDOMEN: soft  EXTREMITIES: No edema   BACK: Normal.  SKIN: Normal by inspection.    MENTAL STATUS: The patient is awake and alert. She knows that she is in the hospital. Affect is appropriate and she responds well. She is not oriented to time however. Comprehension is good and she speaks in full sentences.  CRANIAL NERVES: Pupils are equal, round and reactive to light and accommodation; extra ocular movements are full, there is no significant nystagmus; visual fields are full; upper and lower facial muscles are normal in strength and symmetric, there is no flattening of the nasolabial folds; tongue is midline; uvula is midline; shoulder elevation is normal.  MOTOR: Normal tone, bulk and strength; no pronator drift.  COORDINATION: Left finger to nose is normal, right finger to nose is normal, No rest tremor; no intention tremor; no postural tremor; no bradykinesia.  REFLEXES: Deep tendon reflexes are symmetrical and normal. Babinski reflexes are flexor bilaterally.   SENSATION: Normal to light touch.     Past Medical History  Diagnosis Date  . Hemorrhage of gastrointestinal tract, unspecified   . Other malaise and fatigue   . Tobacco use disorder   . Parotid tumor   . Neoplasm of malignant     breast  ,HX   . Diabetes mellitus   . Osteopenia   . Hypertension   . Hyperlipidemia   . Depression   . Anxiety state, unspecified   . Chronic headaches     Past Surgical History  Procedure Laterality Date  . Mastectomy  1994    left breast -related to  breast cancer  . Vesicovaginal fistula closure w/ tah  1979  . Cholecystectomy  2008  . Hemorrhoid surgery  1960s  . Laparoscopic appendectomy N/A 04/13/2013    Procedure: APPENDECTOMY LAPAROSCOPIC;  Surgeon: Donato Heinz, MD;  Location: [facility];  Service: General;  Laterality: N/A;  . Colonoscopy  2010    Dr. Oneida Alar: single tubular adenoma, one  hyperplastic polyp. Next TCS 2020 health permitting.    Family History  Problem Relation Age of Onset  . Heart failure Mother     CHF  . Cirrhosis Brother   . Lung cancer Father     brain mets  . Kidney cancer Mother   . Kidney cancer Sister     Social History:  reports that she quit smoking about 2 months ago. Her smoking use included Cigarettes. She has a 20 pack-year smoking history. She does not have any smokeless tobacco history on file. She reports that she does not drink alcohol or use illicit drugs.  Allergies:  Allergies  Allergen Reactions  . Penicillins Rash    Medications: Prior to Admission medications   Medication Sig Start Date End Date Taking? Authorizing Provider  acetaminophen (TYLENOL) 500 MG tablet Take 500 mg by mouth every 6 (six) hours as needed for pain.   Yes Historical Provider, MD  ALPRAZolam (NIRAVAM) 0.25 MG dissolvable tablet Take 0.25 mg by mouth at bedtime as needed.     Yes Historical Provider, MD  aspirin 325 MG tablet Take 325 mg by mouth daily.     Yes Historical Provider, MD  Calcium Carbonate-Vit D-Min 600-400 MG-UNIT TABS Take 1 tablet by mouth every morning.   Yes Historical Provider, MD  cholecalciferol (VITAMIN D) 1000 UNITS tablet Take 1,000 Units by mouth every morning.    Yes Historical Provider, MD  Coenzyme Q10 (CO Q 10) 100 MG CAPS Take 100 mg by mouth every morning.    Yes Historical Provider, MD  docusate sodium (COLACE) 100 MG capsule Take 100 mg by mouth 2 (two) times daily.   Yes Historical Provider, MD  esomeprazole (NEXIUM) 40 MG capsule Take 40 mg by mouth at bedtime.    Yes Historical Provider, MD  fish oil-omega-3 fatty acids 1000 MG capsule Take 2 g by mouth every morning.    Yes Historical Provider, MD  glyBURIDE-metformin (GLUCOVANCE) 5-500 MG per tablet Take 2 tablets by mouth 2 (two) times daily with a meal.    Yes Historical Provider, MD  insulin glargine (LANTUS) 100 UNIT/ML injection Inject 2-8 Units into the skin at  bedtime. Give if BS 140-150= 3 units, 151-160=4 units, 161-170=5 units, 171-180= 6 units, >181=7units   Yes Historical Provider, MD  metoprolol succinate (TOPROL-XL) 25 MG 24 hr tablet Take 1 tablet (25 mg total) by mouth daily. 06/15/13  Yes [name], MD  midodrine (PROAMATINE) 5 MG tablet Take 1 tablet (5 mg total) by mouth 3 (three) times daily with meals. 06/15/13  Yes [name], MD  mirtazapine (REMERON SOL-TAB) 15 MG disintegrating tablet Take 1 tablet (15 mg total) by mouth at bedtime. 06/15/13  Yes [name], MD  polyethylene glycol (MIRALAX / GLYCOLAX) packet Take 17 g by mouth daily as needed (Constipation).   Yes Historical Provider, MD  spironolactone (ALDACTONE) 12.5 mg TABS Take 0.5 tablets (12.5 mg total) by mouth daily. 06/15/13  Yes [name], MD  tiotropium (SPIRIVA) 18 MCG inhalation capsule Place 18 mcg into inhaler and inhale daily as needed (shortness of breath).    Historical Provider,  MD    Scheduled Meds: . enoxaparin (LOVENOX) injection  40 mg Subcutaneous Q24H  . insulin aspart  0-5 Units Subcutaneous QHS  . insulin aspart  0-9 Units Subcutaneous TID WC  . [START ON 06/24/2013] metoprolol succinate  50 mg Oral Daily  . mirtazapine  15 mg Oral QHS  . omega-3 acid ethyl esters  2 capsule Oral q morning - 10a  . pantoprazole sodium  40 mg Oral Daily  . sodium chloride  10-40 mL Intracatheter Q12H  . [DISCONTINUED] sodium chloride   Intravenous STAT   Continuous Infusions: . 0.9 % NaCl with KCl 20 mEq / L 100 mL/hr at 06/23/13 1727   PRN Meds:.acetaminophen, ondansetron (ZOFRAN) IV, polyethylene glycol, sodium chloride, sodium phosphate   Blood pressure 146/55, pulse 143, temperature 99.8 F (37.7 C), temperature source Oral, resp. rate 18, height _0  (1.6 m), weight 58.6 kg (129 lb 3 oz), SpO2 95.00%.   Results for orders placed during the hospital encounter of 06/22/13 (from the past 48 hour(s))  URINALYSIS W MICROSCOPIC + REFLEX CULTURE      Status: Abnormal   Collection Time    06/22/13 11:40 PM      Result Value Range   Color, Urine YELLOW  YELLOW   APPearance CLEAR  CLEAR   Specific Gravity, Urine 1.025  1.005 - 1.030   pH 6.0  5.0 - 8.0   Glucose, UA NEGATIVE  NEGATIVE mg/dL   Hgb urine dipstick TRACE (*) NEGATIVE   Bilirubin Urine NEGATIVE  NEGATIVE   Ketones, ur 15 (*) NEGATIVE mg/dL   Protein, ur NEGATIVE  NEGATIVE mg/dL   Urobilinogen, UA 0.2  0.0 - 1.0 mg/dL   Nitrite NEGATIVE  NEGATIVE   Leukocytes, UA SMALL (*) NEGATIVE   WBC, UA 3-6  <3 WBC/hpf   Bacteria, UA RARE  RARE   Squamous Epithelial / LPF RARE  RARE  CBC WITH DIFFERENTIAL     Status: Abnormal   Collection Time    06/22/13 11:47 PM      Result Value Range   WBC 6.6  4.0 - 10.5 K/uL   RBC 3.75 (*) 3.87 - 5.11 MIL/uL   Hemoglobin 12.1  12.0 - 15.0 g/dL   HCT 35.7 (*) 36.0 - 46.0 %   MCV 95.2  78.0 - 100.0 fL   MCH 32.3  26.0 - 34.0 pg   MCHC 33.9  30.0 - 36.0 g/dL   RDW 14.2  11.5 - 15.5 %   Platelets 346  150 - 400 K/uL   Neutrophils Relative % 60  43 - 77 %   Neutro Abs 4.0  1.7 - 7.7 K/uL   Lymphocytes Relative 33  12 - 46 %   Lymphs Abs 2.2  0.7 - 4.0 K/uL   Monocytes Relative 5  3 - 12 %   Monocytes Absolute 0.3  0.1 - 1.0 K/uL   Eosinophils Relative 1  0 - 5 %   Eosinophils Absolute 0.1  0.0 - 0.7 K/uL   Basophils Relative 1  0 - 1 %   Basophils Absolute 0.1  0.0 - 0.1 K/uL  TROPONIN I     Status: None   Collection Time    06/22/13 11:47 PM      Result Value Range   Troponin I <0.30  <0.30 ng/mL   Comment:            Due to the release kinetics of cTnI,     a negative result within the  first hours     of the onset of symptoms does not rule out     myocardial infarction with certainty.     If myocardial infarction is still suspected,     repeat the test at appropriate intervals.  LACTIC ACID, PLASMA     Status: None   Collection Time    06/22/13 11:47 PM      Result Value Range   Lactic Acid, Venous 1.1  0.5 - 2.2 mmol/L    COMPREHENSIVE METABOLIC PANEL     Status: Abnormal   Collection Time    06/22/13 11:50 PM      Result Value Range   Sodium 131 (*) 135 - 145 mEq/L   Potassium 3.6  3.5 - 5.1 mEq/L   Chloride 94 (*) 96 - 112 mEq/L   CO2 24  19 - 32 mEq/L   Glucose, Bld 168 (*) 70 - 99 mg/dL   BUN 10  6 - 23 mg/dL   Creatinine, Ser 0.48 (*) 0.50 - 1.10 mg/dL   Calcium 9.1  8.4 - 10.5 mg/dL   Total Protein 7.4  6.0 - 8.3 g/dL   Albumin 3.4 (*) 3.5 - 5.2 g/dL   AST 28  0 - 37 U/L   ALT 42 (*) 0 - 35 U/L   Alkaline Phosphatase 350 (*) 39 - 117 U/L   Total Bilirubin 0.9  0.3 - 1.2 mg/dL   GFR calc non Af Amer >90  >90 mL/min   GFR calc Af Amer >90  >90 mL/min   Comment:            The eGFR has been calculated     using the CKD EPI equation.     This calculation has not been     validated in all clinical     situations.     eGFR's persistently     <90 mL/min signify     possible Chronic Kidney Disease.  LIPASE, BLOOD     Status: None   Collection Time    06/22/13 11:50 PM      Result Value Range   Lipase 16  11 - 59 U/L  AMMONIA     Status: None   Collection Time    06/22/13 11:50 PM      Result Value Range   Ammonia 28  11 - 60 umol/L  MRSA PCR SCREENING     Status: None   Collection Time    06/23/13  4:00 AM      Result Value Range   MRSA by PCR NEGATIVE  NEGATIVE   Comment:            The GeneXpert MRSA Assay (FDA     approved for NASAL specimens     only), is one component of a     comprehensive MRSA colonization     surveillance program. It is not     intended to diagnose MRSA     infection nor to guide or     monitor treatment for     MRSA infections.  HEMOGLOBIN A1C     Status: Abnormal   Collection Time    06/23/13  4:43 AM      Result Value Range   Hemoglobin A1C 6.5 (*) <5.7 %   Comment: (NOTE)  According to the ADA Clinical Practice Recommendations for 2011, when     HbA1c is used as a  screening test:      >=6.5%   Diagnostic of Diabetes Mellitus               (if abnormal result is confirmed)     5.7-6.4%   Increased risk of developing Diabetes Mellitus     References:Diagnosis and Classification of Diabetes Mellitus,Diabetes     KLKJ,1791,50(VWPVX 1):S62-S69 and Standards of Medical Care in             Diabetes - 2011,Diabetes YIAX,6553,74 (Suppl 1):S11-S61.   Mean Plasma Glucose 140 (*) <117 mg/dL   Comment: Performed at [facility], [facility]     Status: Abnormal   Collection Time    06/23/13  7:34 AM      Result Value Range   Glucose-Capillary 213 (*) 70 - 99 mg/dL   Comment 1 Documented in Chart     Comment 2 Notify RN    BASIC METABOLIC PANEL     Status: Abnormal   Collection Time    06/23/13  9:01 AM      Result Value Range   Sodium 130 (*) 135 - 145 mEq/L   Potassium 3.6  3.5 - 5.1 mEq/L   Chloride 92 (*) 96 - 112 mEq/L   CO2 21  19 - 32 mEq/L   Glucose, Bld 171 (*) 70 - 99 mg/dL   BUN 8  6 - 23 mg/dL   Creatinine, Ser 0.47 (*) 0.50 - 1.10 mg/dL   Calcium 9.3  8.4 - 10.5 mg/dL   GFR calc non Af Amer >90  >90 mL/min   GFR calc Af Amer >90  >90 mL/min   Comment:            The eGFR has been calculated     using the CKD EPI equation.     This calculation has not been     validated in all clinical     situations.     eGFR's persistently     <90 mL/min signify     possible Chronic Kidney Disease.  HEPATIC FUNCTION PANEL     Status: Abnormal   Collection Time    06/23/13  9:01 AM      Result Value Range   Total Protein 7.5  6.0 - 8.3 g/dL   Albumin 3.6  3.5 - 5.2 g/dL   AST 23  0 - 37 U/L   ALT 39 (*) 0 - 35 U/L   Alkaline Phosphatase 359 (*) 39 - 117 U/L   Total Bilirubin 0.8  0.3 - 1.2 mg/dL   Bilirubin, Direct 0.2  0.0 - 0.3 mg/dL   Indirect Bilirubin 0.6  0.3 - 0.9 mg/dL  MAGNESIUM     Status: Abnormal   Collection Time    06/23/13  9:01 AM      Result Value Range   Magnesium 1.3 (*) 1.5 - 2.5 mg/dL  TSH     Status:  None   Collection Time    06/23/13  9:30 AM      Result Value Range   TSH 1.022  0.350 - 4.500 uIU/mL   Comment: Performed at [facility]     Status: Abnormal   Collection Time    06/23/13  9:42 AM      Result Value Range   GGT 464 (*) 7 - 51 U/L   Comment: Performed  at [facility]  GLUCOSE, [facility]     Status: Abnormal   Collection Time    06/23/13 11:15 AM      Result Value Range   Glucose-Capillary 127 (*) 70 - 99 mg/dL  GLUCOSE, CAPILLARY     Status: Abnormal   Collection Time    06/23/13  4:20 PM      Result Value Range   Glucose-Capillary 120 (*) 70 - 99 mg/dL    Dg Chest 2 View  06/23/2013   *RADIOLOGY REPORT*  Clinical Data: Headache and weakness.  Slurred speech.  CHEST - 2 VIEW  Comparison: CT chest 03/29/2010 and portable chest 06/21/2013.  Findings: The lungs are emphysematous.  There is cardiomegaly and vascular congestion.  Right lower lobe airspace disease is identified.  IMPRESSION:  1.  Right lower lobe airspace disease worrisome for pneumonia. 2.  Cardiomegaly and vascular congestion. 3.  Emphysema.   Original Report Authenticated By: Orlean Patten, M.D.   Ct Head Wo Contrast  06/23/2013   *RADIOLOGY REPORT*  Clinical Data: Weakness, fatigue, headache  CT HEAD WITHOUT CONTRAST  Technique:  Contiguous axial images were obtained from the base of the skull through the vertex without contrast.  Comparison: 06/21/2013  Findings: Age-related brain atrophy.  Periventricular chronic microvascular changes, worse on the left.  No acute intracranial hemorrhage, mass lesion, infarction, midline shift, herniation, or extra-axial fluid collection.  Cisterns patent.  No cerebellar abnormality.  Orbits unremarkable.  Mastoids and sinuses clear.  IMPRESSION: Stable atrophy and microvascular changes.   Original Report Authenticated By: Jerilynn Mages. Annamaria Boots, M.D.   Ct Head Wo Contrast  06/22/2013   *RADIOLOGY REPORT*  Clinical Data: Headache  CT HEAD WITHOUT CONTRAST   Technique:  Contiguous axial images were obtained from the base of the skull through the vertex without contrast.  Comparison: 06/15/2013  Findings: Limited with motion artifact through the posterior fossa and skull base.  Age-related brain atrophy with chronic microvascular ischemic changes in the white matter diffusely.  No acute intracranial hemorrhage, mass lesion, definite infarction, midline shift, herniation, hydrocephalus, or extra-axial fluid collection.  Cisterns patent.  No cerebellar abnormality.  Mastoid sinuses grossly clear.  IMPRESSION: Stable atrophy and microvascular ischemic changes.  Limited with motion artifact.   Original Report Authenticated By: Jerilynn Mages. Annamaria Boots, M.D.   Ct Chest Wo Contrast  06/23/2013   *RADIOLOGY REPORT*  Clinical Data: Weakness and fatigue.  Possible pneumonia but chest x-ray.  CT CHEST WITHOUT CONTRAST  Technique:  Multidetector CT imaging of the chest was performed following the standard protocol without IV contrast.  Comparison: PA and lateral chest earlier this same day and CT chest 03/29/2010.  Findings: There is no axillary, hilar or mediastinal lymphadenopathy.  Heart size is mildly enlarged.  No pleural or pericardial effusion is seen.  Emphysematous change is seen in the lung apices.  No consolidative process is identified.  Mild dependent atelectasis is noted.  Visualized upper abdomen is unremarkable.  No focal bony abnormality is identified.  IMPRESSION:  1.  Negative for pneumonia. 2.  Emphysema. 3.  Mild cardiomegaly.   Original Report Authenticated By: Orlean Patten, M.D.   Dg Chest Port 1 View  06/23/2013   *RADIOLOGY REPORT*  Clinical Data: 79 year old female PICC line placement.  PORTABLE CHEST - 1 VIEW  Comparison: Chest CT 0133 hours the same day and earlier.  Findings: Portable AP upright view at 1608 hours.  Right PICC line in place, tip at the level of the cavoatrial junction.  Increased interstitial opacity  compared to 06/21/2013.  No pneumothorax,  pleural effusion or consolidation.  Stable cardiac size and mediastinal contours.  IMPRESSION: 1.  Right upper extremity PICC line tip at the cavoatrial junction level. 2.  Increased interstitial opacity since 06/21/2013 could reflect viral / atypical respiratory infection or vascular congestion.   Original Report Authenticated By: Roselyn Reef, M.D.   Dg Chest Port 1 View  06/21/2013   *RADIOLOGY REPORT*  Clinical Data: Fever and cough.  PORTABLE CHEST - 1 VIEW  Comparison: PA and lateral chest 06/15/2013.  Findings: The lungs are emphysematous but clear.  Heart size is mildly enlarged.  No pneumothorax or pleural effusion.  IMPRESSION: Emphysema and cardiomegaly without acute disease.   Original Report Authenticated By: Orlean Patten, M.D.        Teylor Wolven A. Merlene Laughter, M.D.  Diplomate, Tax adviser of Psychiatry and Neurology ( Neurology). 06/23/2013, 8:28 PM

## 2013-06-23 NOTE — Progress Notes (Signed)
UR chart review completed.  

## 2013-06-23 NOTE — Progress Notes (Signed)
ANTIBIOTIC CONSULT NOTE - INITIAL  Pharmacy Consult for vancomycin Indication: suspected  pneumonia  Allergies  Allergen Reactions  . Penicillins Rash    Patient Measurements: Height: _0  (160 cm) Weight: 135 lb (61.236 kg) IBW/kg (Calculated) : 52.4 Adjusted Body Weight: 55 kg  Vital Signs: Temp: 99.5 F (37.5 C) (08/08 0216) Temp src: Rectal (08/08 0216) BP: 115/85 mmHg (08/07 2347) Pulse Rate: 86 (08/07 2347) Intake/Output from previous day:   Intake/Output from this shift:    Labs:  Recent Labs  06/21/13 2204 06/22/13 2347 06/22/13 2350  WBC 10.8* 6.6  --   HGB 12.3 12.1  --   PLT 352 346  --   CREATININE 0.46*  --  0.48*   Estimated Creatinine Clearance: 46.4 ml/min (by C-G formula based on Cr of 0.48). No results found for this basename: VANCOTROUGH, VANCOPEAK, VANCORANDOM, GENTTROUGH, GENTPEAK, GENTRANDOM, TOBRATROUGH, TOBRAPEAK, TOBRARND, AMIKACINPEAK, AMIKACINTROU, AMIKACIN,  in the last 72 hours   Microbiology: No results found for this or any previous visit (from the past 720 hour(s)).  Medical History: Past Medical History  Diagnosis Date  . Hemorrhage of gastrointestinal tract, unspecified   . Other malaise and fatigue   . Tobacco use disorder   . Parotid tumor   . Neoplasm of malignant     breast  ,HX   . Diabetes mellitus   . Osteopenia   . Hypertension   . Hyperlipidemia   . Depression   . Anxiety state, unspecified   . Chronic headaches     Medications:  Scheduled:  Azactam 2gm x 1 dose @ 0200 Infusions:  PRN:    Assessment:   33yrfemale with suspected pneumonia.  Est CrCl approx 472mmin.  Requested to dose vancomycin for 8 days tx and to adjust meds for renal clearance.  Goal of Therapy:  Probable vancomycin regimen will be 1gm IV q24h.  Not sure what other antibiotic coverage will be ordered (perhaps continuation of Azactam at 1gm q8h or 2gm q12h)  Plan:  1.  Vancomycin 1gm IV x 1 dose tonight 2.  Clinical pharmacist  to follow for maintenance regimen later in morning  Anquan Azzarello, NeNada Boozer/06/2013,2:37 AM

## 2013-06-23 NOTE — H&P (Signed)
[facility] History and Physical  DAMIA BOBROWSKI  ZOX:[id number]  DOB: 19-Apr-1931   DOA: 06/23/2013   PCP:   [name], MD   Chief Complaint:  Progressive confusion for the past 3 days  HPI: [name] is a 79 y.o. female.   Elderly African American lady with recurrent admissions over the past few months; She was discharged from the hospital a week ago after being found to have severe cerebrovascular disease and orthostasis, likely related to her diabetes; started on midodrine and support stockings recommended.    patient is now brought in complaining that she's been getting aggressive and more confused for the past 3 days; initially associated with nausea vomiting and abdominal pain and a low-grade fever. She was treated for this in the [facility] emergency room yesterday and discharged home at 6 AM yesterday morning; the vomiting and abdominal pain seems to subside but the confusion has gotten worse and patient was brought back to the emergency room yesterday evening.   Family reports she has not been eating or drinking. Family report they have repeatedly checked her blood pressure blood sugar; blood pressure remains very elevated at home, blood sugars are always elevated  Complaining constantly of headache Rewiew of Systems:   Unable to obtain because of patient's confusion    Past Medical History  Diagnosis Date  . Hemorrhage of gastrointestinal tract, unspecified   . Other malaise and fatigue   . Tobacco use disorder   . Parotid tumor   . Neoplasm of malignant     breast  ,HX   . Diabetes mellitus   . Osteopenia   . Hypertension   . Hyperlipidemia   . Depression   . Anxiety state, unspecified   . Chronic headaches     Past Surgical History  Procedure Laterality Date  . Mastectomy  1994    left breast -related to breast cancer  . Vesicovaginal fistula closure w/ tah  1979  . Cholecystectomy  2008  . Hemorrhoid surgery  1960s  . Laparoscopic appendectomy  N/A 04/13/2013    Procedure: APPENDECTOMY LAPAROSCOPIC;  Surgeon: Donato Heinz, MD;  Location: [facility];  Service: General;  Laterality: N/A;  . Appendectomy      Medications:  HOME MEDS: Prior to Admission medications   Medication Sig Start Date End Date Taking? Authorizing Provider  acetaminophen (TYLENOL) 500 MG tablet Take 500 mg by mouth every 6 (six) hours as needed for pain.   Yes Historical Provider, MD  ALPRAZolam (NIRAVAM) 0.25 MG dissolvable tablet Take 0.25 mg by mouth at bedtime as needed.     Yes Historical Provider, MD  aspirin 325 MG tablet Take 325 mg by mouth daily.     Yes Historical Provider, MD  Calcium Carbonate-Vit D-Min 600-400 MG-UNIT TABS Take 1 tablet by mouth every morning.   Yes Historical Provider, MD  cholecalciferol (VITAMIN D) 1000 UNITS tablet Take 1,000 Units by mouth every morning.    Yes Historical Provider, MD  Coenzyme Q10 (CO Q 10) 100 MG CAPS Take 100 mg by mouth every morning.    Yes Historical Provider, MD  docusate sodium (COLACE) 100 MG capsule Take 100 mg by mouth 2 (two) times daily.   Yes Historical Provider, MD  esomeprazole (NEXIUM) 40 MG capsule Take 40 mg by mouth at bedtime.    Yes Historical Provider, MD  fish oil-omega-3 fatty acids 1000 MG capsule Take 2 g by mouth every morning.    Yes Historical Provider, MD  glyBURIDE-metformin (  GLUCOVANCE) 5-500 MG per tablet Take 2 tablets by mouth 2 (two) times daily with a meal.    Yes Historical Provider, MD  insulin glargine (LANTUS) 100 UNIT/ML injection Inject 2-8 Units into the skin at bedtime. Give if BS 140-150= 3 units, 151-160=4 units, 161-170=5 units, 171-180= 6 units, >181=7units   Yes Historical Provider, MD  metoprolol succinate (TOPROL-XL) 25 MG 24 hr tablet Take 1 tablet (25 mg total) by mouth daily. 06/15/13  Yes [name], MD  midodrine (PROAMATINE) 5 MG tablet Take 1 tablet (5 mg total) by mouth 3 (three) times daily with meals. 06/15/13  Yes [name], MD  mirtazapine  (REMERON SOL-TAB) 15 MG disintegrating tablet Take 1 tablet (15 mg total) by mouth at bedtime. 06/15/13  Yes [name], MD  polyethylene glycol (MIRALAX / GLYCOLAX) packet Take 17 g by mouth daily as needed (Constipation).   Yes Historical Provider, MD  spironolactone (ALDACTONE) 12.5 mg TABS Take 0.5 tablets (12.5 mg total) by mouth daily. 06/15/13  Yes [name], MD  tiotropium (SPIRIVA) 18 MCG inhalation capsule Place 18 mcg into inhaler and inhale daily as needed (shortness of breath).    Historical Provider, MD     Allergies:  Allergies  Allergen Reactions  . Penicillins Rash    Social History:   reports that she quit smoking about 2 months ago. Her smoking use included Cigarettes. She has a 20 pack-year smoking history. She does not have any smokeless tobacco history on file. She reports that she does not drink alcohol or use illicit drugs.  Family History: Family History  Problem Relation Age of Onset  . Heart failure Mother     CHF  . Cirrhosis Brother   . Lung cancer Father     brain mets  . Kidney cancer Mother   . Kidney cancer Sister      Physical Exam: Filed Vitals:   06/22/13 2344 06/22/13 2347 06/23/13 0216 06/23/13 0249  BP: 158/76 115/85  123/82  Pulse: 84 86  100  Temp:   99.5 F (37.5 C)   TempSrc:   Rectal   Resp:      Height:      Weight:      SpO2:    99%   Blood pressure 123/82, pulse 100, temperature 99.5 F (37.5 C), temperature source Rectal, resp. rate 16, height _0  (1.6 m), weight 61.236 kg (135 lb), SpO2 99.00%. Body mass index is 23.92 kg/(m^2).   GEN:  Pleasant Elderly African American lady lying bed; cooperative with exam PSYCH:  alert and oriented x1;   neither anxious nor depressed; affect is appropriate. HEENT: Mucous membranes pink, dry  and anicteric; PERRLA; EOM intact; no cervical lymphadenopathy nor thyromegaly or carotid bruit; no JVD; Breasts:: Not examined CHEST WALL: No tenderness CHEST: Normal respiration, clear  to auscultation bilaterally HEART: Regular rate and rhythm; no murmurs rubs or gallops BACK: No kyphosis no scoliosis; no CVA tenderness ABDOMEN: soft non-tender; no masses, no organomegaly, normal abdominal bowel sounds; no pannus; no intertriginous candida. Rectal Exam: Not done EXTREMITIES:  age-appropriate arthropathy of the hands and knees; no edema; no ulcerations. Genitalia: not examined PULSES: 2+ and symmetric SKIN: Normal hydration no rash or ulceration CNS: Cranial nerves 2-12 grossly intact no focal lateralizing neurologic deficit   Labs on Admission:  Basic Metabolic Panel:  Recent Labs Lab 06/21/13 2204 06/22/13 2350  NA 134* 131*  K 3.5 3.6  CL 99 94*  CO2 25 24  GLUCOSE 196* 168*  BUN 10 10  CREATININE 0.46* 0.48*  CALCIUM 9.0 9.1   Liver Function Tests:  Recent Labs Lab 06/22/13 2350  AST 28  ALT 42*  ALKPHOS 350*  BILITOT 0.9  PROT 7.4  ALBUMIN 3.4*    Recent Labs Lab 06/21/13 2204 06/22/13 2350  LIPASE 15 16    Recent Labs Lab 06/22/13 2350  AMMONIA 28   CBC:  Recent Labs Lab 06/21/13 2204 06/22/13 2347  WBC 10.8* 6.6  NEUTROABS  --  4.0  HGB 12.3 12.1  HCT 36.1 35.7*  MCV 93.8 95.2  PLT 352 346   Cardiac Enzymes:  Recent Labs Lab 06/22/13 2347  TROPONINI <0.30   BNP: No components found with this basename: POCBNP,  D-dimer: No components found with this basename: D-DIMER,  CBG:  Recent Labs Lab 06/21/13 2205  GLUCAP 202*    Radiological Exams on Admission: Dg Chest 2 View  06/23/2013   *RADIOLOGY REPORT*  Clinical Data: Headache and weakness.  Slurred speech.  CHEST - 2 VIEW  Comparison: CT chest 03/29/2010 and portable chest 06/21/2013.  Findings: The lungs are emphysematous.  There is cardiomegaly and vascular congestion.  Right lower lobe airspace disease is identified.  IMPRESSION:  1.  Right lower lobe airspace disease worrisome for pneumonia. 2.  Cardiomegaly and vascular congestion. 3.  Emphysema.    Original Report Authenticated By: Orlean Patten, M.D.   Ct Head Wo Contrast  06/23/2013   *RADIOLOGY REPORT*  Clinical Data: Weakness, fatigue, headache  CT HEAD WITHOUT CONTRAST  Technique:  Contiguous axial images were obtained from the base of the skull through the vertex without contrast.  Comparison: 06/21/2013  Findings: Age-related brain atrophy.  Periventricular chronic microvascular changes, worse on the left.  No acute intracranial hemorrhage, mass lesion, infarction, midline shift, herniation, or extra-axial fluid collection.  Cisterns patent.  No cerebellar abnormality.  Orbits unremarkable.  Mastoids and sinuses clear.  IMPRESSION: Stable atrophy and microvascular changes.   Original Report Authenticated By: Jerilynn Mages. Annamaria Boots, M.D.   Ct Head Wo Contrast  06/22/2013   *RADIOLOGY REPORT*  Clinical Data: Headache  CT HEAD WITHOUT CONTRAST  Technique:  Contiguous axial images were obtained from the base of the skull through the vertex without contrast.  Comparison: 06/15/2013  Findings: Limited with motion artifact through the posterior fossa and skull base.  Age-related brain atrophy with chronic microvascular ischemic changes in the white matter diffusely.  No acute intracranial hemorrhage, mass lesion, definite infarction, midline shift, herniation, hydrocephalus, or extra-axial fluid collection.  Cisterns patent.  No cerebellar abnormality.  Mastoid sinuses grossly clear.  IMPRESSION: Stable atrophy and microvascular ischemic changes.  Limited with motion artifact.   Original Report Authenticated By: Jerilynn Mages. Annamaria Boots, M.D.   Ct Chest Wo Contrast  06/23/2013   *RADIOLOGY REPORT*  Clinical Data: Weakness and fatigue.  Possible pneumonia but chest x-ray.  CT CHEST WITHOUT CONTRAST  Technique:  Multidetector CT imaging of the chest was performed following the standard protocol without IV contrast.  Comparison: PA and lateral chest earlier this same day and CT chest 03/29/2010.  Findings: There is no axillary, hilar  or mediastinal lymphadenopathy.  Heart size is mildly enlarged.  No pleural or pericardial effusion is seen.  Emphysematous change is seen in the lung apices.  No consolidative process is identified.  Mild dependent atelectasis is noted.  Visualized upper abdomen is unremarkable.  No focal bony abnormality is identified.  IMPRESSION:  1.  Negative for pneumonia. 2.  Emphysema. 3.  Mild cardiomegaly.  Original Report Authenticated By: Orlean Patten, M.D.   Dg Chest Port 1 View  06/21/2013   *RADIOLOGY REPORT*  Clinical Data: Fever and cough.  PORTABLE CHEST - 1 VIEW  Comparison: PA and lateral chest 06/15/2013.  Findings: The lungs are emphysematous but clear.  Heart size is mildly enlarged.  No pneumothorax or pleural effusion.  IMPRESSION: Emphysema and cardiomegaly without acute disease.   Original Report Authenticated By: Orlean Patten, M.D.      Assessment/Plan    Active Problems:   DIABETES MELLITUS, TYPE II, UNCONTROLLED   HYPERTENSION   Hyponatremia   Dehydration   Failure to thrive   Acute delirium    PLAN: We'll admit this lady for hydration and monitoring of her mental status; Will continue her home antihypertensives except the diuretics spironolactone apply TED hose,   Other plans as per orders.  Code Status:  full  Family Communication: Plans discuss with  daughter at bedside Disposition Plan:  per primary care physician will assume care later today    Jesten Cappuccio Nocturnist [facility] Pager [phone]   06/23/2013, 3:22 AM

## 2013-06-23 NOTE — ED Provider Notes (Signed)
Patient with nausea vomiting and low-grade fever. Also as dull headache. His overall well-appearing. Head CT is reassuring. Care turned over to Dr. Stevie Kern. Medical screening examination/treatment/procedure(s) were conducted as a shared visit with non-physician practitioner(s) and myself.  I personally evaluated the patient during the encounter  Jasper Riling. Alvino Chapel, MD 06/23/13 0947

## 2013-06-23 NOTE — Progress Notes (Signed)
Subjective: Patient was admitted last night due to generalized weakness and confusion. Her condition has declined and she is not taking her oral feeding.  Objective: Vital signs in last 24 hours: Temp:  [99.2 F (37.3 C)-99.6 F (37.6 C)] 99.4 F (37.4 C) (08/08 0800) Pulse Rate:  [80-143] 143 (08/08 0805) Resp:  [16-27] 22 (08/08 1000) BP: (95-196)/(63-92) 95/63 mmHg (08/08 0805) SpO2:  [94 %-99 %] 95 % (08/08 0800) Weight:  [58.6 kg (129 lb 3 oz)-61.236 kg (135 lb)] 58.6 kg (129 lb 3 oz) (08/08 0400) Weight change:  Last BM Date: 06/21/13  Intake/Output from previous day: 08/07 0701 - 08/08 0700 In: 408.3 [I.V.:208.3; IV Piggyback:200] Out: -   PHYSICAL EXAM General appearance: cachectic, delirious and no distress Resp: clear to auscultation bilaterally Cardio: S1 and S2 heard, tachycardic GI: soft, non-tender; bowel sounds normal; no masses,  no organomegaly Extremities: extremities normal, atraumatic, no cyanosis or edema  Lab Results:    _0 @ [facility] No results found for this basename: PHART, PCO2, PO2ART, TCO2, HCO3,  in the last 72 hours CULTURES Recent Results (from the past 240 hour(s))  CULTURE, BLOOD (ROUTINE X 2)     Status: None   Collection Time    06/21/13 10:25 PM      Result Value Range Status   Specimen Description BLOOD RIGHT HAND   Final   Special Requests BOTTLES DRAWN AEROBIC ONLY 1CC   Final   Culture  Setup Time     Final   Value: 06/22/2013 04:30     Performed at [facility]   Culture     Final   Value:        BLOOD CULTURE RECEIVED NO GROWTH TO DATE CULTURE WILL BE HELD FOR 5 DAYS BEFORE ISSUING A FINAL NEGATIVE REPORT     Performed at [facility]   Report Status PENDING   Incomplete  CULTURE, BLOOD (ROUTINE X 2)     Status: None   Collection Time    06/21/13 11:00 PM      Result Value Range Status   Specimen Description BLOOD HAND RIGHT   Final   Special Requests BOTTLES DRAWN AEROBIC ONLY 3CC   Final   Culture   Setup Time     Final   Value: 06/22/2013 04:30     Performed at [facility]   Culture     Final   Value:        BLOOD CULTURE RECEIVED NO GROWTH TO DATE CULTURE WILL BE HELD FOR 5 DAYS BEFORE ISSUING A FINAL NEGATIVE REPORT     Performed at [facility]   Report Status PENDING   Incomplete  URINE CULTURE     Status: None   Collection Time    06/21/13 11:00 PM      Result Value Range Status   Specimen Description URINE, CATHETERIZED   Final   Special Requests NONE   Final   Culture  Setup Time     Final   Value: 06/22/2013 07:09     Performed at [facility]     Final   Value: >=100,000 COLONIES/ML     Performed at [facility]   Culture     Final   Value: Multiple bacterial morphotypes present, none predominant. Suggest appropriate recollection if clinically indicated.     Performed at [facility]   Report Status 06/23/2013 FINAL   Final  MRSA PCR SCREENING  Status: None   Collection Time    06/23/13  4:00 AM      Result Value Range Status   MRSA by PCR NEGATIVE  NEGATIVE Final   Comment:            The GeneXpert MRSA Assay (FDA     approved for NASAL specimens     only), is one component of a     comprehensive MRSA colonization     surveillance program. It is not     intended to diagnose MRSA     infection nor to guide or     monitor treatment for     MRSA infections.   Studies/Results: Dg Chest 2 View  06/23/2013   *RADIOLOGY REPORT*  Clinical Data: Headache and weakness.  Slurred speech.  CHEST - 2 VIEW  Comparison: CT chest 03/29/2010 and portable chest 06/21/2013.  Findings: The lungs are emphysematous.  There is cardiomegaly and vascular congestion.  Right lower lobe airspace disease is identified.  IMPRESSION:  1.  Right lower lobe airspace disease worrisome for pneumonia. 2.  Cardiomegaly and vascular congestion. 3.  Emphysema.   Original Report Authenticated By: Orlean Patten, M.D.   Ct Head Wo  Contrast  06/23/2013   *RADIOLOGY REPORT*  Clinical Data: Weakness, fatigue, headache  CT HEAD WITHOUT CONTRAST  Technique:  Contiguous axial images were obtained from the base of the skull through the vertex without contrast.  Comparison: 06/21/2013  Findings: Age-related brain atrophy.  Periventricular chronic microvascular changes, worse on the left.  No acute intracranial hemorrhage, mass lesion, infarction, midline shift, herniation, or extra-axial fluid collection.  Cisterns patent.  No cerebellar abnormality.  Orbits unremarkable.  Mastoids and sinuses clear.  IMPRESSION: Stable atrophy and microvascular changes.   Original Report Authenticated By: Jerilynn Mages. Annamaria Boots, M.D.   Ct Head Wo Contrast  06/22/2013   *RADIOLOGY REPORT*  Clinical Data: Headache  CT HEAD WITHOUT CONTRAST  Technique:  Contiguous axial images were obtained from the base of the skull through the vertex without contrast.  Comparison: 06/15/2013  Findings: Limited with motion artifact through the posterior fossa and skull base.  Age-related brain atrophy with chronic microvascular ischemic changes in the white matter diffusely.  No acute intracranial hemorrhage, mass lesion, definite infarction, midline shift, herniation, hydrocephalus, or extra-axial fluid collection.  Cisterns patent.  No cerebellar abnormality.  Mastoid sinuses grossly clear.  IMPRESSION: Stable atrophy and microvascular ischemic changes.  Limited with motion artifact.   Original Report Authenticated By: Jerilynn Mages. Annamaria Boots, M.D.   Ct Chest Wo Contrast  06/23/2013   *RADIOLOGY REPORT*  Clinical Data: Weakness and fatigue.  Possible pneumonia but chest x-ray.  CT CHEST WITHOUT CONTRAST  Technique:  Multidetector CT imaging of the chest was performed following the standard protocol without IV contrast.  Comparison: PA and lateral chest earlier this same day and CT chest 03/29/2010.  Findings: There is no axillary, hilar or mediastinal lymphadenopathy.  Heart size is mildly enlarged.  No  pleural or pericardial effusion is seen.  Emphysematous change is seen in the lung apices.  No consolidative process is identified.  Mild dependent atelectasis is noted.  Visualized upper abdomen is unremarkable.  No focal bony abnormality is identified.  IMPRESSION:  1.  Negative for pneumonia. 2.  Emphysema. 3.  Mild cardiomegaly.   Original Report Authenticated By: Orlean Patten, M.D.   Dg Chest Port 1 View  06/21/2013   *RADIOLOGY REPORT*  Clinical Data: Fever and cough.  PORTABLE CHEST - 1 VIEW  Comparison:  PA and lateral chest 06/15/2013.  Findings: The lungs are emphysematous but clear.  Heart size is mildly enlarged.  No pneumothorax or pleural effusion.  IMPRESSION: Emphysema and cardiomegaly without acute disease.   Original Report Authenticated By: Orlean Patten, M.D.    Medications: I have reviewed the patient's current medications.  Assesment:  Active Problems:   DIABETES MELLITUS, TYPE II, UNCONTROLLED   HYPERTENSION   Hyponatremia   Dehydration   Failure to thrive   Acute delirium elevated LFT   Plan: Medications reviewed Will repeat CBC, BMP and LFT Will do Cardiology, GI and neurology, consult Continue IV hydration    LOS: 1 day   Barbee Mamula 06/23/2013, 11:45 AM

## 2013-06-23 NOTE — Consult Note (Addendum)
CARDIOLOGY CONSULT NOTE  Patient ID: JAYLIANNA TATLOCK MRN: [id number] DOB/AGE: 1931/04/29 79 y.o.  Admit date: 06/22/2013 Referring Physician: Legrand Rams, MD Primary PhysicianFANTA,TESFAYE, MD Primary Cardiologist: Angelena Form Reason for Consultation:Tachycardia  Active Problems:   DIABETES MELLITUS, TYPE II, UNCONTROLLED   HYPERTENSION   Hyponatremia   Dehydration   Failure to thrive   Acute delirium  HPI: Mrs. Randal is a 79 y/o patient with known history of nonobstructive CAD, diabetes, hypertension,  hyperlipidemia, who is followed by [name] last seen in the office on 02/16/2013. The patient has a history of recent catheterization in May of 2011 revealing mild disease of the LAD but no other obstructive lesions. She has a history of aortic insufficiency.  She underwent a laproscopic appendectomy this spring.  After this she was admtted in June with poor po intake, near syncopal spell  Antihypertensives held.  She was at some point on midodrine.   She was seen in ER the day before yesterday [facility]) for N/V, abd pain, low grade fever.  She was discharged home from ER.  N/V improved but she became more confused Intake is poor.  BP at home has been elevated Presented to [facility] with confusion.  Son said she has had some confusion over summer  Mon/Tues they noted more confusion  Yesterday it became acutely worse  Brought her to [facility]    She was found to be hyponatremic with a sodium of 131 potassium 3.6 with a creatinine serum 0.48 and a BUN of 10. , UA  With specific gravity of 1.025, negative for UTI.  Chest x-ray was negative for pneumonia or CHF she was found to have some mild cardiomegaly. Blood cultures have been drawn. Patient started on antibiotic therapy.CT negative for acute bleed or CVA on admission.Abd [place], negative for acute abnormality.    She has been having episodes of tachycardia rates up to 120-140 bpm. In past had been on metoprolol 25 mg daily at home   Family states that she has  stopped eating for several days, she is talking incoherently, is very weak  Son says that back in April she was doing great  Driving  Mentally alert.  Breathing OK  No CP  No dizziness  Review of systems complete and found to be negative unless listed above   Past Medical History  Diagnosis Date  . Hemorrhage of gastrointestinal tract, unspecified   . Other malaise and fatigue   . Tobacco use disorder   . Parotid tumor   . Neoplasm of malignant     breast  ,HX   . Diabetes mellitus   . Osteopenia   . Hypertension   . Hyperlipidemia   . Depression   . Anxiety state, unspecified   . Chronic headaches     Family History  Problem Relation Age of Onset  . Heart failure Mother     CHF  . Cirrhosis Brother   . Lung cancer Father     brain mets  . Kidney cancer Mother   . Kidney cancer Sister     History   Social History  . Marital Status: Divorced    Spouse Name: N/A    Number of Children: N/A  . Years of Education: N/A   Occupational History  . RETIRED FACTORY WORKER     [employer]   Social History Main Topics  . Smoking status: Former Smoker -- 0.50 packs/day for 40 years    Types: Cigarettes    Quit date: 03/30/2013  . Smokeless tobacco:  Not on file  . Alcohol Use: No  . Drug Use: No  . Sexually Active: No   Other Topics Concern  . 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 Started age 77.   Drug use -no   Alcohol use - no   Lives with son   Education - 12 th grade                   Past Surgical History  Procedure Laterality Date  . Mastectomy  1994    left breast -related to breast cancer  . Vesicovaginal fistula closure w/ tah  1979  . Cholecystectomy  2008  . Hemorrhoid surgery  1960s  . Laparoscopic appendectomy N/A 04/13/2013    Procedure:  APPENDECTOMY LAPAROSCOPIC;  Surgeon: Donato Heinz, MD;  Location: [facility];  Service: General;  Laterality: N/A;  . Appendectomy       Prescriptions prior to admission  Medication Sig Dispense Refill  . acetaminophen (TYLENOL) 500 MG tablet Take 500 mg by mouth every 6 (six) hours as needed for pain.      Marland Kitchen ALPRAZolam (NIRAVAM) 0.25 MG dissolvable tablet Take 0.25 mg by mouth at bedtime as needed.        Marland Kitchen aspirin 325 MG tablet Take 325 mg by mouth daily.        . Calcium Carbonate-Vit D-Min 600-400 MG-UNIT TABS Take 1 tablet by mouth every morning.      . cholecalciferol (VITAMIN D) 1000 UNITS tablet Take 1,000 Units by mouth every morning.       . Coenzyme Q10 (CO Q 10) 100 MG CAPS Take 100 mg by mouth every morning.       . docusate sodium (COLACE) 100 MG capsule Take 100 mg by mouth 2 (two) times daily.      Marland Kitchen esomeprazole (NEXIUM) 40 MG capsule Take 40 mg by mouth at bedtime.       . fish oil-omega-3 fatty acids 1000 MG capsule Take 2 g by mouth every morning.       . glyBURIDE-metformin (GLUCOVANCE) 5-500 MG per tablet Take 2 tablets by mouth 2 (two) times daily with a meal.       . insulin glargine (LANTUS) 100 UNIT/ML injection Inject 2-8 Units into the skin at bedtime. Give if BS 140-150= 3 units, 151-160=4 units, 161-170=5 units, 171-180= 6 units, >181=7units      . metoprolol succinate (TOPROL-XL) 25 MG 24 hr tablet Take 1 tablet (25 mg total) by mouth daily.  30 tablet  3  . midodrine (PROAMATINE) 5 MG tablet Take 1 tablet (5 mg total) by mouth 3 (three) times daily with meals.  30 tablet  3  . mirtazapine (REMERON SOL-TAB) 15 MG disintegrating tablet Take 1 tablet (15 mg total) by mouth at bedtime.  30 tablet  3  . polyethylene glycol (MIRALAX / GLYCOLAX) packet Take 17 g by mouth daily as needed (Constipation).      Marland Kitchen spironolactone (ALDACTONE) 12.5 mg TABS Take 0.5 tablets (12.5 mg total) by mouth daily.  30 tablet  3  . tiotropium (SPIRIVA) 18 MCG inhalation capsule Place 18  mcg into inhaler and inhale daily as needed (shortness of breath).        Physical Exam: Blood pressure  95/63, pulse 143, temperature 99.4 F (37.4 C), temperature source Oral, resp. rate 19, height _0  (1.6 m), weight 129 lb 3 oz (58.6 kg), SpO2 95.00%.   General: Patient is a thin 79 yo in NAD Head:    Normal cephalic and atramatic  Lungs: Clear bilaterally to auscultation and percussion. Heart: HRRR S1 S2, occasional extra systole,.  Pulses are 2+ & equal.            Positive carotid bruit. No JVD. Abdomen: Bowel sounds are hypoactive positive, abdomen soft and non-tender without masses or                  Hernia's noted. Msk:  Back normal,diminished l strength and tone for age.. Extremities: No clubbing, cyanosis or edema.  DP +1  Feet cool Neuro: Alert, but confused in speech, word salad, cooperative but does not follow commands.   Echocardiogram 02/2013 Left ventricle: The cavity size was normal. Wall thickness was normal. Systolic function was vigorous. The estimated ejection fraction was in the range of 65% to 70%. Wall motion was normal; there were no regional wall motion abnormalities. Doppler parameters are consistent with abnormal left ventricular relaxation (grade 1 diastolic dysfunction). - Aortic valve: There was no stenosis. - Mitral valve: Trivial regurgitation. - Right ventricle: The cavity size was normal. Systolic function was normal. - Tricuspid valve: Peak RV-RA gradient: 66m Hg (S). - Pulmonary arteries: PA peak pressure: 376mHg (S). - Inferior vena cava: The vessel was normal in size; the respirophasic diameter changes were in the normal range (= 50%); findings are consistent with normal central venous pressure.   Labs:   Lab Results  Component Value Date   WBC 6.6 06/22/2013   HGB 12.1 06/22/2013   HCT 35.7* 06/22/2013   MCV 95.2 06/22/2013   PLT 346 06/22/2013    Recent Labs Lab 06/22/13 2350  NA 131*  K 3.6  CL 94*  CO2 24  BUN 10   CREATININE 0.48*  CALCIUM 9.1  PROT 7.4  BILITOT 0.9  ALKPHOS 350*  ALT 42*  AST 28  GLUCOSE 168*       BNP (last 3 results)  Recent Labs  06/12/13 1304  PROBNP 880.3*      Radiology: Dg Chest 2 View  06/23/2013   *RADIOLOGY REPORT*  Clinical Data: Headache and weakness.  Slurred speech.  CHEST - 2 VIEW  Comparison: CT chest 03/29/2010 and portable chest 06/21/2013.  Findings: The lungs are emphysematous.  There is cardiomegaly and vascular congestion.  Right lower lobe airspace disease is identified.  IMPRESSION:  1.  Right lower lobe airspace disease worrisome for pneumonia. 2.  Cardiomegaly and vascular congestion. 3.  Emphysema.   Original Report Authenticated By: ThOrlean PattenM.D.   Ct Head Wo Contrast  06/23/2013   *RADIOLOGY REPORT*  Clinical Data: Weakness, fatigue, headache  CT HEAD WITHOUT CONTRAST  Technique:  Contiguous axial images were obtained from the base of the skull through the vertex without contrast.  Comparison: 06/21/2013  Findings: Age-related brain atrophy.  Periventricular chronic microvascular changes, worse on the left.  No acute intracranial hemorrhage, mass lesion, infarction, midline shift, herniation, or extra-axial fluid collection.  Cisterns patent.  No cerebellar abnormality.  Orbits unremarkable.  Mastoids and sinuses clear.  IMPRESSION: Stable atrophy and microvascular changes.   Original Report Authenticated By: M.Jerilynn MagesShAnnamaria BootsM.D.   Ct Head Wo Contrast  06/22/2013   *RADIOLOGY REPORT*  Clinical Data: Headache  CT HEAD WITHOUT CONTRAST  Technique:  Contiguous axial images were obtained from the base of the skull through the vertex without contrast.  Comparison: 06/15/2013  Findings: Limited with motion artifact through the posterior fossa and skull base.  Age-related brain atrophy with chronic microvascular ischemic changes in the white matter diffusely.  No acute intracranial hemorrhage, mass lesion, definite infarction, midline shift, herniation,  hydrocephalus, or extra-axial fluid collection.  Cisterns patent.  No cerebellar abnormality.  Mastoid sinuses grossly clear.  IMPRESSION: Stable atrophy and microvascular ischemic changes.  Limited with motion artifact.   Original Report Authenticated By: Jerilynn Mages. Annamaria Boots, M.D.   Ct Chest Wo Contrast  06/23/2013   *RADIOLOGY REPORT*  Clinical Data: Weakness and fatigue.  Possible pneumonia but chest x-ray.  CT CHEST WITHOUT CONTRAST  Technique:  Multidetector CT imaging of the chest was performed following the standard protocol without IV contrast.  Comparison: PA and lateral chest earlier this same day and CT chest 03/29/2010.  Findings: There is no axillary, hilar or mediastinal lymphadenopathy.  Heart size is mildly enlarged.  No pleural or pericardial effusion is seen.  Emphysematous change is seen in the lung apices.  No consolidative process is identified.  Mild dependent atelectasis is noted.  Visualized upper abdomen is unremarkable.  No focal bony abnormality is identified.  IMPRESSION:  1.  Negative for pneumonia. 2.  Emphysema. 3.  Mild cardiomegaly.   Original Report Authenticated By: Orlean Patten, M.D.   Dg Chest Port 1 View  06/21/2013   *RADIOLOGY REPORT*  Clinical Data: Fever and cough.  PORTABLE CHEST - 1 VIEW  Comparison: PA and lateral chest 06/15/2013.  Findings: The lungs are emphysematous but clear.  Heart size is mildly enlarged.  No pneumothorax or pleural effusion.  IMPRESSION: Emphysema and cardiomegaly without acute disease.   Original Report Authenticated By: Orlean Patten, M.D.   EKG:NSR with PAC's rates in the 44. Telemetry: HR  70s to 150 (SR, ST occasional atrial tachycardia  ASSESSMENT AND PLAN:   1.Tachycardia: Review of telemetry patient has had some atrial tachycardia/SVT.  Now in SR.  I think this is all being exacerbated by her other medical issues with poor po intake  I would continue on metoprolol  Titrate as BP/HR allow.    2. Hyponatremia: This may be  contributing to confusion.  She is receiving NS infusion at 125cc/hr. I would back down on rate some as she is petite  Follow I/O  3.  Hx hypotension.  Patient was placed on midodrine this summer.  I would not use. Episodes of decreased BP probably due to poor intake due to GI issues.    4.  N/V, poor PO intake  Agree with GI evaluation.  She has had difficulties since surgery.  5. CAD: Cardiac cath on 03/31/10 with mild disease in the LAD but no obstructive lesions. Continue medical management .  6.Confusion:   Neuro has been consulted  May be multifactoral   7.Fever: Blood cultures are pending. She is now on IV antibiotics   Signed: Phill Myron. Purcell Nails NP [facility] 06/23/2013, 9:21 AM Co-Sign MD  Patient seen and examined  I have amended note to reflect my findings.

## 2013-06-23 NOTE — Consult Note (Signed)
REVIEWED. AGREE.  EGD IF PT CONTINUES TO HAVE FTT.

## 2013-06-23 NOTE — ED Provider Notes (Signed)
CSN: [id number]     Arrival date & time 06/22/13  2148 History     First MD Initiated Contact with Patient 06/22/13 2313     Chief Complaint  Patient presents with  . Weakness  . Fatigue  . Headache    HPI Pt was seen at 2320. Per pt and her family, c/o gradual onset and worsening of persistent generalized weakness and fatigue for the past 1 month, worse over the past week. Pt's daughter and son state she has not been eating or drinking well since being discharged from the hospital last week. Pt's daughter states she "doesn't want to get out of bed" and "just lays around." Apparently when she attempts to get out of bed she "just gets too weak to stand up." Has had "slurred speech" yesterday after waking up from a nap. Denies focal motor weakness, no tingling/numbness in extremities, no syncope, no facial droop, no fevers, no rash, no CP/palpitations, no SOB/cough, no abd pain, no N/V/D. The symptoms have been associated with no other complaints. The patient has a significant history of similar symptoms previously, recently being evaluated for this complaint and multiple prior evals for same. Pt has been seen in the ED 4 times in the past month and admitted twice last month for similar symptoms.   Past Medical History  Diagnosis Date  . Hemorrhage of gastrointestinal tract, unspecified   . Other malaise and fatigue   . Tobacco use disorder   . Parotid tumor   . Neoplasm of malignant     breast  ,HX   . Diabetes mellitus   . Osteopenia   . Hypertension   . Hyperlipidemia   . Depression   . Anxiety state, unspecified   . Chronic headaches    Past Surgical History  Procedure Laterality Date  . Mastectomy  1994    left breast -related to breast cancer  . Vesicovaginal fistula closure w/ tah  1979  . Cholecystectomy  2008  . Hemorrhoid surgery  1960s  . Laparoscopic appendectomy N/A 04/13/2013    Procedure: APPENDECTOMY LAPAROSCOPIC;  Surgeon: Donato Heinz, MD;  Location: [facility];   Service: General;  Laterality: N/A;  . Appendectomy     Family History  Problem Relation Age of Onset  . Heart failure Mother     CHF  . Cirrhosis Brother   . Lung cancer Father     brain mets  . Kidney cancer Mother   . Kidney cancer Sister    History  Substance Use Topics  . Smoking status: Former Smoker -- 0.50 packs/day for 40 years    Types: Cigarettes    Quit date: 03/30/2013  . Smokeless tobacco: Not on file  . Alcohol Use: No    Review of Systems ROS: Statement: All systems negative except as marked or noted in the HPI; Constitutional: Negative for fever and chills. ; ; Eyes: Negative for eye pain, redness and discharge. ; ; ENMT: Negative for ear pain, hoarseness, nasal congestion, sinus pressure and sore throat. ; ; Cardiovascular: Negative for chest pain, palpitations, diaphoresis, dyspnea and peripheral edema. ; ; Respiratory: Negative for cough, wheezing and stridor. ; ; Gastrointestinal: +poor PO intake. Negative for nausea, vomiting, diarrhea, abdominal pain, blood in stool, hematemesis, jaundice and rectal bleeding. . ; ; Genitourinary: Negative for dysuria, flank pain and hematuria. ; ; Musculoskeletal: Negative for back pain and neck pain. Negative for swelling and trauma.; ; Skin: Negative for pruritus, rash, abrasions, blisters, bruising and skin lesion.; ;  Neuro: +lethargy, generalized weakness. slurred speech. Negative for headache, lightheadedness and neck stiffness. Negative for altered level of consciousness , altered mental status, extremity weakness, paresthesias, involuntary movement, seizure and syncope.      Allergies  Penicillins  Home Medications   Current Outpatient Rx  Name  Route  Sig  Dispense  Refill  . acetaminophen (TYLENOL) 500 MG tablet   Oral   Take 500 mg by mouth every 6 (six) hours as needed for pain.         Marland Kitchen ALPRAZolam (NIRAVAM) 0.25 MG dissolvable tablet   Oral   Take 0.25 mg by mouth at bedtime as needed.           Marland Kitchen  aspirin 325 MG tablet   Oral   Take 325 mg by mouth daily.           . Calcium Carbonate-Vit D-Min 600-400 MG-UNIT TABS   Oral   Take 1 tablet by mouth every morning.         . cholecalciferol (VITAMIN D) 1000 UNITS tablet   Oral   Take 1,000 Units by mouth every morning.          . Coenzyme Q10 (CO Q 10) 100 MG CAPS   Oral   Take 100 mg by mouth every morning.          . docusate sodium (COLACE) 100 MG capsule   Oral   Take 100 mg by mouth 2 (two) times daily.         Marland Kitchen esomeprazole (NEXIUM) 40 MG capsule   Oral   Take 40 mg by mouth at bedtime.          . fish oil-omega-3 fatty acids 1000 MG capsule   Oral   Take 2 g by mouth every morning.          . glyBURIDE-metformin (GLUCOVANCE) 5-500 MG per tablet   Oral   Take 2 tablets by mouth 2 (two) times daily with a meal.          . insulin glargine (LANTUS) 100 UNIT/ML injection   Subcutaneous   Inject 2-8 Units into the skin at bedtime. Give if BS 140-150= 3 units, 151-160=4 units, 161-170=5 units, 171-180= 6 units, >181=7units         . metoprolol succinate (TOPROL-XL) 25 MG 24 hr tablet   Oral   Take 1 tablet (25 mg total) by mouth daily.   30 tablet   3   . midodrine (PROAMATINE) 5 MG tablet   Oral   Take 1 tablet (5 mg total) by mouth 3 (three) times daily with meals.   30 tablet   3   . mirtazapine (REMERON SOL-TAB) 15 MG disintegrating tablet   Oral   Take 1 tablet (15 mg total) by mouth at bedtime.   30 tablet   3   . polyethylene glycol (MIRALAX / GLYCOLAX) packet   Oral   Take 17 g by mouth daily as needed (Constipation).         Marland Kitchen spironolactone (ALDACTONE) 12.5 mg TABS   Oral   Take 0.5 tablets (12.5 mg total) by mouth daily.   30 tablet   3   . tiotropium (SPIRIVA) 18 MCG inhalation capsule   Inhalation   Place 18 mcg into inhaler and inhale daily as needed (shortness of breath).          BP 115/85  Pulse 86  Temp(Src) 99.5 F (37.5 C) (Rectal)  Resp 16  Ht  5'  3" (1.6 m)  Wt 135 lb (61.236 kg)  BMI 23.92 kg/m2  SpO2 95% Physical Exam 2325: Physical examination:  Nursing notes reviewed; Vital signs and O2 SAT reviewed;  Constitutional: Well developed, Well nourished, In no acute distress; Head:  Normocephalic, atraumatic; Eyes: EOMI, PERRL, No scleral icterus; ENMT: Mouth and pharynx normal, Mucous membranes dry; Neck: Supple, No meningeal signs. Full range of motion, No lymphadenopathy; Cardiovascular: Regular rate and rhythm, occasional ectopy. No gallop; Respiratory: Breath sounds clear & equal bilaterally, No wheezes.  Speaking full sentences with ease, Normal respiratory effort/excursion; Chest: Nontender, Movement normal; Abdomen: Soft, Nontender, Nondistended, Normal bowel sounds; Genitourinary: No CVA tenderness; Extremities: Pulses normal, No tenderness, No edema, No calf edema or asymmetry.; Neuro: AA&Ox3, Major CN grossly intact. No facial droop. Speech clear. Equal grips. No drift x4 extremities. Strength 5/5 equal bilat UE's and LE's. Moves all extremities well without apparent gross focal motor deficits.; Skin: Color normal, Warm, Dry. No rash.; Psych:  Affect flat, poor eye contact.    ED Course   Procedures    MDM  MDM Reviewed: previous chart, nursing note and vitals Reviewed previous: labs, ECG and CT scan Interpretation: labs, ECG, x-ray and CT scan    Date: 06/23/2013  Rate: 86  Rhythm: normal sinus rhythm and premature atrial contractions (PAC)  QRS Axis: normal  Intervals: normal  ST/T Wave abnormalities: normal  Conduction Disutrbances:none  Narrative Interpretation:   Old EKG Reviewed: unchanged; no significant changes from previous EKG dated 06/12/2013.   Results for orders placed during the hospital encounter of 06/22/13  URINALYSIS W MICROSCOPIC + REFLEX CULTURE      Result Value Range   Color, Urine YELLOW  YELLOW   APPearance CLEAR  CLEAR   Specific Gravity, Urine 1.025  1.005 - 1.030   pH 6.0  5.0 - 8.0    Glucose, UA NEGATIVE  NEGATIVE mg/dL   Hgb urine dipstick TRACE (*) NEGATIVE   Bilirubin Urine NEGATIVE  NEGATIVE   Ketones, ur 15 (*) NEGATIVE mg/dL   Protein, ur NEGATIVE  NEGATIVE mg/dL   Urobilinogen, UA 0.2  0.0 - 1.0 mg/dL   Nitrite NEGATIVE  NEGATIVE   Leukocytes, UA SMALL (*) NEGATIVE   WBC, UA 3-6  <3 WBC/hpf   Bacteria, UA RARE  RARE   Squamous Epithelial / LPF RARE  RARE  CBC WITH DIFFERENTIAL      Result Value Range   WBC 6.6  4.0 - 10.5 K/uL   RBC 3.75 (*) 3.87 - 5.11 MIL/uL   Hemoglobin 12.1  12.0 - 15.0 g/dL   HCT 35.7 (*) 36.0 - 46.0 %   MCV 95.2  78.0 - 100.0 fL   MCH 32.3  26.0 - 34.0 pg   MCHC 33.9  30.0 - 36.0 g/dL   RDW 14.2  11.5 - 15.5 %   Platelets 346  150 - 400 K/uL   Neutrophils Relative % 60  43 - 77 %   Neutro Abs 4.0  1.7 - 7.7 K/uL   Lymphocytes Relative 33  12 - 46 %   Lymphs Abs 2.2  0.7 - 4.0 K/uL   Monocytes Relative 5  3 - 12 %   Monocytes Absolute 0.3  0.1 - 1.0 K/uL   Eosinophils Relative 1  0 - 5 %   Eosinophils Absolute 0.1  0.0 - 0.7 K/uL   Basophils Relative 1  0 - 1 %   Basophils Absolute 0.1  0.0 - 0.1 K/uL  TROPONIN  I      Result Value Range   Troponin I <0.30  <0.30 ng/mL  LACTIC ACID, PLASMA      Result Value Range   Lactic Acid, Venous 1.1  0.5 - 2.2 mmol/L  COMPREHENSIVE METABOLIC PANEL      Result Value Range   Sodium 131 (*) 135 - 145 mEq/L   Potassium 3.6  3.5 - 5.1 mEq/L   Chloride 94 (*) 96 - 112 mEq/L   CO2 24  19 - 32 mEq/L   Glucose, Bld 168 (*) 70 - 99 mg/dL   BUN 10  6 - 23 mg/dL   Creatinine, Ser 0.48 (*) 0.50 - 1.10 mg/dL   Calcium 9.1  8.4 - 10.5 mg/dL   Total Protein 7.4  6.0 - 8.3 g/dL   Albumin 3.4 (*) 3.5 - 5.2 g/dL   AST 28  0 - 37 U/L   ALT 42 (*) 0 - 35 U/L   Alkaline Phosphatase 350 (*) 39 - 117 U/L   Total Bilirubin 0.9  0.3 - 1.2 mg/dL   GFR calc non Af Amer >90  >90 mL/min   GFR calc Af Amer >90  >90 mL/min  LIPASE, BLOOD      Result Value Range   Lipase 16  11 - 59 U/L  AMMONIA       Result Value Range   Ammonia 28  11 - 60 umol/L   Dg Chest 2 View 06/23/2013   *RADIOLOGY REPORT*  Clinical Data: Headache and weakness.  Slurred speech.  CHEST - 2 VIEW  Comparison: CT chest 03/29/2010 and portable chest 06/21/2013.  Findings: The lungs are emphysematous.  There is cardiomegaly and vascular congestion.  Right lower lobe airspace disease is identified.  IMPRESSION:  1.  Right lower lobe airspace disease worrisome for pneumonia. 2.  Cardiomegaly and vascular congestion. 3.  Emphysema.   Original Report Authenticated By: Orlean Patten, M.D.   Ct Head Wo Contrast 06/23/2013   *RADIOLOGY REPORT*  Clinical Data: Weakness, fatigue, headache  CT HEAD WITHOUT CONTRAST  Technique:  Contiguous axial images were obtained from the base of the skull through the vertex without contrast.  Comparison: 06/21/2013  Findings: Age-related brain atrophy.  Periventricular chronic microvascular changes, worse on the left.  No acute intracranial hemorrhage, mass lesion, infarction, midline shift, herniation, or extra-axial fluid collection.  Cisterns patent.  No cerebellar abnormality.  Orbits unremarkable.  Mastoids and sinuses clear.  IMPRESSION: Stable atrophy and microvascular changes.   Original Report Authenticated By: Jerilynn Mages. Annamaria Boots, M.D.   Ct Head Wo Contrast 06/22/2013   *RADIOLOGY REPORT*  Clinical Data: Headache  CT HEAD WITHOUT CONTRAST  Technique:  Contiguous axial images were obtained from the base of the skull through the vertex without contrast.  Comparison: 06/15/2013  Findings: Limited with motion artifact through the posterior fossa and skull base.  Age-related brain atrophy with chronic microvascular ischemic changes in the white matter diffusely.  No acute intracranial hemorrhage, mass lesion, definite infarction, midline shift, herniation, hydrocephalus, or extra-axial fluid collection.  Cisterns patent.  No cerebellar abnormality.  Mastoid sinuses grossly clear.  IMPRESSION: Stable atrophy and  microvascular ischemic changes.  Limited with motion artifact.   Original Report Authenticated By: Jerilynn Mages. Shick, M.D.     [id number]:  Pt mildly orthostatic on VS. Possible HCAP on CXR, will tx, given temp yesterday of 100.2 when seen in ED for c/o acute flair of her chronic headache. Dx and testing d/w pt and family.  Questions answered.  Verb understanding, agreeable to observation admit.  T/C to [facility] Dr. Megan Salon, case discussed, including:  HPI, pertinent PM/SHx, VS/PE, dx testing, ED course and treatment:  Agreeable to observation admit, requests to obtain CT chest w/o contrast to further evaluate possible infiltrate on CXR, write temporary orders, obtain tele bed to Dr. Josephine Cables service.                Alfonzo Feller, DO 06/24/13 1810

## 2013-06-24 DIAGNOSIS — R630 Anorexia: Secondary | ICD-10-CM

## 2013-06-24 DIAGNOSIS — R7989 Other specified abnormal findings of blood chemistry: Secondary | ICD-10-CM

## 2013-06-24 DIAGNOSIS — R627 Adult failure to thrive: Secondary | ICD-10-CM

## 2013-06-24 LAB — URINALYSIS, ROUTINE W REFLEX MICROSCOPIC
Bilirubin Urine: NEGATIVE
Glucose, UA: NEGATIVE mg/dL
Ketones, ur: 15 mg/dL — AB
Nitrite: NEGATIVE
Protein, ur: NEGATIVE mg/dL
Specific Gravity, Urine: 1.01 (ref 1.005–1.030)
Urobilinogen, UA: 0.2 mg/dL (ref 0.0–1.0)
pH: 7 (ref 5.0–8.0)

## 2013-06-24 LAB — HEPATIC FUNCTION PANEL
ALT: 31 U/L (ref 0–35)
AST: 24 U/L (ref 0–37)
Albumin: 3.3 g/dL — ABNORMAL LOW (ref 3.5–5.2)
Alkaline Phosphatase: 302 U/L — ABNORMAL HIGH (ref 39–117)
Bilirubin, Direct: 0.2 mg/dL (ref 0.0–0.3)
Indirect Bilirubin: 0.7 mg/dL (ref 0.3–0.9)
Total Bilirubin: 0.9 mg/dL (ref 0.3–1.2)
Total Protein: 7 g/dL (ref 6.0–8.3)

## 2013-06-24 LAB — BASIC METABOLIC PANEL
BUN: 7 mg/dL (ref 6–23)
CO2: 23 mEq/L (ref 19–32)
Calcium: 9 mg/dL (ref 8.4–10.5)
Chloride: 101 mEq/L (ref 96–112)
Creatinine, Ser: 0.51 mg/dL (ref 0.50–1.10)
GFR calc Af Amer: >90 mL/min (ref >90)
GFR calc non Af Amer: 88 mL/min — ABNORMAL LOW (ref >90)
Glucose, Bld: 111 mg/dL — ABNORMAL HIGH (ref 70–99)
Potassium: 3.3 mEq/L — ABNORMAL LOW (ref 3.5–5.1)
Sodium: 137 mEq/L (ref 135–145)

## 2013-06-24 LAB — CBC
HCT: 35.9 % — ABNORMAL LOW (ref 36.0–46.0)
Hemoglobin: 12.2 g/dL (ref 12.0–15.0)
MCH: 32.4 pg (ref 26.0–34.0)
MCHC: 34 g/dL (ref 30.0–36.0)
MCV: 95.2 fL (ref 78.0–100.0)
Platelets: 371 10*3/uL (ref 150–400)
RBC: 3.77 MIL/uL — ABNORMAL LOW (ref 3.87–5.11)
RDW: 13.9 % (ref 11.5–15.5)
WBC: 6.5 10*3/uL (ref 4.0–10.5)

## 2013-06-24 LAB — PRO B NATRIURETIC PEPTIDE: Pro B Natriuretic peptide (BNP): 768.3 pg/mL — ABNORMAL HIGH (ref 0–450)

## 2013-06-24 LAB — GLUCOSE, CAPILLARY
Glucose-Capillary: 133 mg/dL — ABNORMAL HIGH (ref 70–99)
Glucose-Capillary: 89 mg/dL (ref 70–99)
Glucose-Capillary: 301 mg/dL — ABNORMAL HIGH (ref 70–99)
Glucose-Capillary: 111 mg/dL — ABNORMAL HIGH (ref 70–99)

## 2013-06-24 LAB — URINE MICROSCOPIC-ADD ON

## 2013-06-24 LAB — HEPATITIS B SURFACE ANTIGEN: Hepatitis B Surface Ag: NEGATIVE

## 2013-06-24 LAB — HEPATITIS C ANTIBODY: HCV Ab: NEGATIVE

## 2013-06-24 MED ORDER — MIRTAZAPINE 30 MG PO TBDP
30.0000 mg | ORAL_TABLET | Freq: Every day | ORAL | Status: DC
  Administered 2013-06-24 – 2013-06-26 (×3): 30 mg via ORAL
  Filled 2013-06-24 (×4): qty 1

## 2013-06-24 MED ORDER — LISINOPRIL 10 MG PO TABS
20.0000 mg | ORAL_TABLET | Freq: Every day | ORAL | Status: DC
  Administered 2013-06-24 – 2013-06-27 (×4): 20 mg via ORAL
  Filled 2013-06-24 (×4): qty 2

## 2013-06-24 MED ORDER — MEGESTROL ACETATE 400 MG/10ML PO SUSP
400.0000 mg | Freq: Two times a day (BID) | ORAL | Status: DC
  Administered 2013-06-24 – 2013-06-27 (×7): 400 mg via ORAL
  Filled 2013-06-24 (×7): qty 10

## 2013-06-24 NOTE — Progress Notes (Signed)
Subjective: She was admitted with acute confusion. She seems somewhat better according to her family. She had been complaining of headache but that is better. Her blood pressure was up this morning. She has been telling her family that she think she's going to die today.  Objective: Vital signs in last 24 hours: Temp:  [98.4 F (36.9 C)-99.8 F (37.7 C)] 98.8 F (37.1 C) (08/09 0546) Pulse Rate:  [71-77] 77 (08/09 0546) Resp:  [18-24] 24 (08/09 0554) BP: (137-236)/(55-131) 180/90 mmHg (08/09 0707) SpO2:  [94 %] 94 % (08/09 0546) Weight:  [61.5 kg (135 lb 9.3 oz)-61.6 kg (135 lb 12.9 oz)] 61.6 kg (135 lb 12.9 oz) (08/09 0500) Weight change: 0.265 kg (9.3 oz) Last BM Date: 06/21/13  Intake/Output from previous day: 08/08 0701 - 08/09 0700 In: 1561.3 [I.V.:1561.3] Out: 550 [Urine:550]  PHYSICAL EXAM General appearance: alert, cooperative and moderate distress Resp: clear to auscultation bilaterally Cardio: regular rate and rhythm, S1, S2 normal, no murmur, click, rub or gallop GI: She has mild right lower quadrant tenderness Extremities: extremities normal, atraumatic, no cyanosis or edema  Lab Results:    Basic Metabolic Panel:  Recent Labs  06/23/13 0901 06/24/13 0513  NA 130* 137  K 3.6 3.3*  CL 92* 101  CO2 21 23  GLUCOSE 171* 111*  BUN 8 7  CREATININE 0.47* 0.51  CALCIUM 9.3 9.0  MG 1.3*  --    Liver Function Tests:  Recent Labs  06/23/13 0901 06/24/13 0513  AST 23 24  ALT 39* 31  ALKPHOS 359* 302*  BILITOT 0.8 0.9  PROT 7.5 7.0  ALBUMIN 3.6 3.3*    Recent Labs  06/21/13 2204 06/22/13 2350  LIPASE 15 16    Recent Labs  06/22/13 2350  AMMONIA 28   CBC:  Recent Labs  06/22/13 2347 06/24/13 0513  WBC 6.6 6.5  NEUTROABS 4.0  --   HGB 12.1 12.2  HCT 35.7* 35.9*  MCV 95.2 95.2  PLT 346 371   Cardiac Enzymes:  Recent Labs  06/22/13 2347  TROPONINI <0.30   BNP:  Recent Labs  06/24/13 0513  PROBNP 768.3*   D-Dimer: No  results found for this basename: DDIMER,  in the last 72 hours CBG:  Recent Labs  06/21/13 2205 06/23/13 0734 06/23/13 1115 06/23/13 1620 06/23/13 2119 06/24/13 0729  GLUCAP 202* 213* 127* 120* 124* 133*   Hemoglobin A1C:  Recent Labs  06/23/13 0443  HGBA1C 6.5*   Fasting Lipid Panel: No results found for this basename: CHOL, HDL, LDLCALC, TRIG, CHOLHDL, LDLDIRECT,  in the last 72 hours Thyroid Function Tests:  Recent Labs  06/23/13 0930  TSH 1.022   Anemia Panel: No results found for this basename: VITAMINB12, FOLATE, FERRITIN, TIBC, IRON, RETICCTPCT,  in the last 72 hours Coagulation: No results found for this basename: LABPROT, INR,  in the last 72 hours Urine Drug Screen: Drugs of Abuse  No results found for this basename: labopia, cocainscrnur, labbenz, amphetmu, thcu, labbarb    Alcohol Level: No results found for this basename: ETH,  in the last 72 hours Urinalysis:  Recent Labs  06/22/13 2340 06/24/13 0415  COLORURINE YELLOW YELLOW  LABSPEC 1.025 1.010  PHURINE 6.0 7.0  GLUCOSEU NEGATIVE NEGATIVE  HGBUR TRACE* SMALL*  BILIRUBINUR NEGATIVE NEGATIVE  KETONESUR 15* 15*  PROTEINUR NEGATIVE NEGATIVE  UROBILINOGEN 0.2 0.2  NITRITE NEGATIVE NEGATIVE  LEUKOCYTESUR SMALL* LARGE*   Misc. Labs:  ABGS No results found for this basename: PHART, PCO2, PO2ART, TCO2,  HCO3,  in the last 72 hours CULTURES Recent Results (from the past 240 hour(s))  CULTURE, BLOOD (ROUTINE X 2)     Status: None   Collection Time    06/21/13 10:25 PM      Result Value Range Status   Specimen Description BLOOD RIGHT HAND   Final   Special Requests BOTTLES DRAWN AEROBIC ONLY 1CC   Final   Culture  Setup Time     Final   Value: 06/22/2013 04:30     Performed at [facility]   Culture     Final   Value:        BLOOD CULTURE RECEIVED NO GROWTH TO DATE CULTURE WILL BE HELD FOR 5 DAYS BEFORE ISSUING A FINAL NEGATIVE REPORT     Performed at [facility]   Report  Status PENDING   Incomplete  CULTURE, BLOOD (ROUTINE X 2)     Status: None   Collection Time    06/21/13 11:00 PM      Result Value Range Status   Specimen Description BLOOD HAND RIGHT   Final   Special Requests BOTTLES DRAWN AEROBIC ONLY 3CC   Final   Culture  Setup Time     Final   Value: 06/22/2013 04:30     Performed at [facility]   Culture     Final   Value:        BLOOD CULTURE RECEIVED NO GROWTH TO DATE CULTURE WILL BE HELD FOR 5 DAYS BEFORE ISSUING A FINAL NEGATIVE REPORT     Performed at [facility]   Report Status PENDING   Incomplete  URINE CULTURE     Status: None   Collection Time    06/21/13 11:00 PM      Result Value Range Status   Specimen Description URINE, CATHETERIZED   Final   Special Requests NONE   Final   Culture  Setup Time     Final   Value: 06/22/2013 07:09     Performed at [facility]     Final   Value: >=100,000 COLONIES/ML     Performed at [facility]   Culture     Final   Value: Multiple bacterial morphotypes present, none predominant. Suggest appropriate recollection if clinically indicated.     Performed at [facility]   Report Status 06/23/2013 FINAL   Final  MRSA PCR SCREENING     Status: None   Collection Time    06/23/13  4:00 AM      Result Value Range Status   MRSA by PCR NEGATIVE  NEGATIVE Final   Comment:            The GeneXpert MRSA Assay (FDA     approved for NASAL specimens     only), is one component of a     comprehensive MRSA colonization     surveillance program. It is not     intended to diagnose MRSA     infection nor to guide or     monitor treatment for     MRSA infections.   Studies/Results: Dg Chest 2 View  06/23/2013   *RADIOLOGY REPORT*  Clinical Data: Headache and weakness.  Slurred speech.  CHEST - 2 VIEW  Comparison: CT chest 03/29/2010 and portable chest 06/21/2013.  Findings: The lungs are emphysematous.  There is cardiomegaly and vascular  congestion.  Right lower lobe airspace disease is identified.  IMPRESSION:  1.  Right lower lobe airspace disease worrisome for pneumonia. 2.  Cardiomegaly and vascular congestion. 3.  Emphysema.   Original Report Authenticated By: Orlean Patten, M.D.   Ct Head Wo Contrast  06/23/2013   *RADIOLOGY REPORT*  Clinical Data: Weakness, fatigue, headache  CT HEAD WITHOUT CONTRAST  Technique:  Contiguous axial images were obtained from the base of the skull through the vertex without contrast.  Comparison: 06/21/2013  Findings: Age-related brain atrophy.  Periventricular chronic microvascular changes, worse on the left.  No acute intracranial hemorrhage, mass lesion, infarction, midline shift, herniation, or extra-axial fluid collection.  Cisterns patent.  No cerebellar abnormality.  Orbits unremarkable.  Mastoids and sinuses clear.  IMPRESSION: Stable atrophy and microvascular changes.   Original Report Authenticated By: Jerilynn Mages. Annamaria Boots, M.D.   Ct Chest Wo Contrast  06/23/2013   *RADIOLOGY REPORT*  Clinical Data: Weakness and fatigue.  Possible pneumonia but chest x-ray.  CT CHEST WITHOUT CONTRAST  Technique:  Multidetector CT imaging of the chest was performed following the standard protocol without IV contrast.  Comparison: PA and lateral chest earlier this same day and CT chest 03/29/2010.  Findings: There is no axillary, hilar or mediastinal lymphadenopathy.  Heart size is mildly enlarged.  No pleural or pericardial effusion is seen.  Emphysematous change is seen in the lung apices.  No consolidative process is identified.  Mild dependent atelectasis is noted.  Visualized upper abdomen is unremarkable.  No focal bony abnormality is identified.  IMPRESSION:  1.  Negative for pneumonia. 2.  Emphysema. 3.  Mild cardiomegaly.   Original Report Authenticated By: Orlean Patten, M.D.   Dg Chest Port 1 View  06/23/2013   *RADIOLOGY REPORT*  Clinical Data: 79 year old female PICC line placement.  PORTABLE CHEST - 1 VIEW   Comparison: Chest CT 0133 hours the same day and earlier.  Findings: Portable AP upright view at 1608 hours.  Right PICC line in place, tip at the level of the cavoatrial junction.  Increased interstitial opacity compared to 06/21/2013.  No pneumothorax, pleural effusion or consolidation.  Stable cardiac size and mediastinal contours.  IMPRESSION: 1.  Right upper extremity PICC line tip at the cavoatrial junction level. 2.  Increased interstitial opacity since 06/21/2013 could reflect viral / atypical respiratory infection or vascular congestion.   Original Report Authenticated By: Roselyn Reef, M.D.    Medications:  Prior to Admission:  Prescriptions prior to admission  Medication Sig Dispense Refill  . acetaminophen (TYLENOL) 500 MG tablet Take 500 mg by mouth every 6 (six) hours as needed for pain.      Marland Kitchen ALPRAZolam (NIRAVAM) 0.25 MG dissolvable tablet Take 0.25 mg by mouth at bedtime as needed.        Marland Kitchen aspirin 325 MG tablet Take 325 mg by mouth daily.        . Calcium Carbonate-Vit D-Min 600-400 MG-UNIT TABS Take 1 tablet by mouth every morning.      . cholecalciferol (VITAMIN D) 1000 UNITS tablet Take 1,000 Units by mouth every morning.       . Coenzyme Q10 (CO Q 10) 100 MG CAPS Take 100 mg by mouth every morning.       . docusate sodium (COLACE) 100 MG capsule Take 100 mg by mouth 2 (two) times daily.      Marland Kitchen esomeprazole (NEXIUM) 40 MG capsule Take 40 mg by mouth at bedtime.       . fish oil-omega-3 fatty acids 1000 MG capsule Take 2 g by mouth every morning.       Marland Kitchen  glyBURIDE-metformin (GLUCOVANCE) 5-500 MG per tablet Take 2 tablets by mouth 2 (two) times daily with a meal.       . insulin glargine (LANTUS) 100 UNIT/ML injection Inject 2-8 Units into the skin at bedtime. Give if BS 140-150= 3 units, 151-160=4 units, 161-170=5 units, 171-180= 6 units, >181=7units      . metoprolol succinate (TOPROL-XL) 25 MG 24 hr tablet Take 1 tablet (25 mg total) by mouth daily.  30 tablet  3  . midodrine  (PROAMATINE) 5 MG tablet Take 1 tablet (5 mg total) by mouth 3 (three) times daily with meals.  30 tablet  3  . mirtazapine (REMERON SOL-TAB) 15 MG disintegrating tablet Take 1 tablet (15 mg total) by mouth at bedtime.  30 tablet  3  . polyethylene glycol (MIRALAX / GLYCOLAX) packet Take 17 g by mouth daily as needed (Constipation).      Marland Kitchen spironolactone (ALDACTONE) 12.5 mg TABS Take 0.5 tablets (12.5 mg total) by mouth daily.  30 tablet  3  . tiotropium (SPIRIVA) 18 MCG inhalation capsule Place 18 mcg into inhaler and inhale daily as needed (shortness of breath).       Scheduled: . enoxaparin (LOVENOX) injection  40 mg Subcutaneous Q24H  . insulin aspart  0-5 Units Subcutaneous QHS  . insulin aspart  0-9 Units Subcutaneous TID WC  . lisinopril  20 mg Oral Daily  . megestrol  400 mg Oral BID  . metoprolol succinate  50 mg Oral Daily  . mirtazapine  30 mg Oral QHS  . omega-3 acid ethyl esters  2 capsule Oral q morning - 10a  . pantoprazole sodium  40 mg Oral Daily  . sodium chloride  10-40 mL Intracatheter Q12H   Continuous: . 0.9 % NaCl with KCl 20 mEq / L 100 mL/hr at 06/23/13 2137   FPK:[id number], butalbital-acetaminophen-caffeine, ondansetron (ZOFRAN) IV, polyethylene glycol, sodium chloride  Assesment: She was admitted with acute delirium. This is felt to be metabolic encephalopathy but we don't have a definite cause of that. Although her chest x-ray was suggestive of pneumonia her CT of the chest did not show evidence of that. I think she's probably depressed. She has been on Remeron but I think that has been mostly for appetite. She has failure to thrive. She has trouble with hypertension and has been having a headache and that's better. She has severe extracranial occlusive disease. She has been having orthostatic hypotension. Active Problems:   DIABETES MELLITUS, TYPE II, UNCONTROLLED   HYPERTENSION   Hyponatremia   Dehydration   Failure to thrive   Acute  delirium    Plan: I will have her add Megace. She will increase Remeron. She will add lisinopril to her current antihypertensives. GI consult is underway    LOS: 2 days   Jacorion Klem L 06/24/2013, 10:20 AM

## 2013-06-24 NOTE — Progress Notes (Signed)
Subjective; Patient denies abdominal pain nausea or vomiting. She has no appetite. She has lost close to 20 pounds over the last 2-3 months.  Objective;  BP 180/90  Pulse 77  Temp(Src) 98.8 F (37.1 C) (Oral)  Resp 24  Ht _0  (1.6 m)  Wt 135 lb 12.9 oz (61.6 kg)  BMI 24.06 kg/m2  SpO2 94% Abdomen is symmetrical with normal bowel sounds. It is soft and nontender without hepatosplenomegaly.  Lab data; WBC 6.5, H&H 12.2 and 35.9 and platelet count 371K Bilirubin oh 0.9, AP 302, AST 24 and ALT 31 and albumin is 3.3 Hepatitis B surface antigen is negative Appetite is C virus antibody is nonreactive. ANA, SMA and AMA are pending.  Assessment; #1. Mildly elevated transaminases with cholestatic pattern. No evidence of diarrhea obstruction based on imaging studies. Transaminases are not normal and AP is trending downwards. AMA and autoimmune markers are pending. #2. Anorexia and weight loss. Patient begun on Megace earlier today by Dr. Luan Pulling. EGD was offered but she is not interested in endoscopic evaluation at this time. #3. Mental status changes. She has been evaluated by Dr. Merlene Laughter. She is responding appropriately to simple questions.  Recommendations; Await results of AMA and autoimmune markers. Will consider EGD if she does not respond to Megace. Will also check sedimentation rate.

## 2013-06-25 LAB — URINE CULTURE
Colony Count: NO GROWTH
Culture: NO GROWTH

## 2013-06-25 LAB — GLUCOSE, CAPILLARY
Glucose-Capillary: 140 mg/dL — ABNORMAL HIGH (ref 70–99)
Glucose-Capillary: 303 mg/dL — ABNORMAL HIGH (ref 70–99)
Glucose-Capillary: 76 mg/dL (ref 70–99)
Glucose-Capillary: 273 mg/dL — ABNORMAL HIGH (ref 70–99)

## 2013-06-25 LAB — SEDIMENTATION RATE: Sed Rate: 12 mm/hr (ref 0–22)

## 2013-06-25 MED ORDER — DIPHENHYDRAMINE HCL 25 MG PO CAPS
25.0000 mg | ORAL_CAPSULE | Freq: Four times a day (QID) | ORAL | Status: DC | PRN
  Administered 2013-06-25 – 2013-06-26 (×2): 25 mg via ORAL
  Filled 2013-06-25 (×2): qty 1

## 2013-06-25 NOTE — Progress Notes (Signed)
Subjective; Patient has no complaints. She states her appetite is better. She has even more than 50% of her breakfast. She denies nausea vomiting or abdominal pain. She also denies diarrhea. She has not been confused  Today according to her daughter.  Objective; BP 171/80  Pulse 79  Temp(Src) 98.4 F (36.9 C) (Oral)  Resp 20  Ht 5' 3" (1.6 m)  Wt 140 lb 14 oz (63.9 kg)  BMI 24.96 kg/m2  SpO2 97% Abdomen remains soft and nontender without organomegaly or masses. No LE edema noted.  Lab data; Sedimentation rate is 12. ANA, ASMA and AMA are still pending.  Assessment; #1. Mildly elevated transaminases with cholestatic pattern. Imaging studies have been negative for biliary obstruction. AST and ALT were normal yesterday and alkaline phosphatase was down to 302 from 507 on 06/15/2013. Sedimentation rate is normal.  AMA and autoimmune markers are still pending.  #2. Anorexia and weight loss. Patient was begun on Megace yesterday and her appetite seemed to have gotten a lot better in the last 24 hours. #3. Mental status changes.  Recommendations; Will recheck LFTs in a.m.

## 2013-06-25 NOTE — Progress Notes (Signed)
Subjective: She seems to feel better. She was able to eat. She is less confused  Objective: Vital signs in last 24 hours: Temp:  [98.4 F (36.9 C)-98.7 F (37.1 C)] 98.4 F (36.9 C) (08/10 0955) Pulse Rate:  [70-85] 79 (08/10 0955) Resp:  [20-22] 20 (08/10 0955) BP: (137-171)/(79-82) 171/80 mmHg (08/10 0955) SpO2:  [94 %-97 %] 97 % (08/10 0955) Weight:  [63.9 kg (140 lb 14 oz)] 63.9 kg (140 lb 14 oz) (08/10 0359) Weight change: 2.4 kg (5 lb 4.7 oz) Last BM Date: 06/22/13  Intake/Output from previous day: 08/09 0701 - 08/10 0700 In: 3175 [P.O.:200; I.V.:2975] Out: -   PHYSICAL EXAM General appearance: alert, cooperative and no distress Resp: clear to auscultation bilaterally Cardio: regular rate and rhythm, S1, S2 normal, no murmur, click, rub or gallop GI: soft, non-tender; bowel sounds normal; no masses,  no organomegaly Extremities: extremities normal, atraumatic, no cyanosis or edema  Lab Results:    Basic Metabolic Panel:  Recent Labs  06/23/13 0901 06/24/13 0513  NA 130* 137  K 3.6 3.3*  CL 92* 101  CO2 21 23  GLUCOSE 171* 111*  BUN 8 7  CREATININE 0.47* 0.51  CALCIUM 9.3 9.0  MG 1.3*  --    Liver Function Tests:  Recent Labs  06/23/13 0901 06/24/13 0513  AST 23 24  ALT 39* 31  ALKPHOS 359* 302*  BILITOT 0.8 0.9  PROT 7.5 7.0  ALBUMIN 3.6 3.3*    Recent Labs  06/22/13 2350  LIPASE 16    Recent Labs  06/22/13 2350  AMMONIA 28   CBC:  Recent Labs  06/22/13 2347 06/24/13 0513  WBC 6.6 6.5  NEUTROABS 4.0  --   HGB 12.1 12.2  HCT 35.7* 35.9*  MCV 95.2 95.2  PLT 346 371   Cardiac Enzymes:  Recent Labs  06/22/13 2347  TROPONINI <0.30   BNP:  Recent Labs  06/24/13 0513  PROBNP 768.3*   D-Dimer: No results found for this basename: DDIMER,  in the last 72 hours CBG:  Recent Labs  06/23/13 2119 06/24/13 0729 06/24/13 1121 06/24/13 1630 06/24/13 2013 06/25/13 0741  GLUCAP 124* 133* 89 301* 111* 140*    Hemoglobin A1C:  Recent Labs  06/23/13 0443  HGBA1C 6.5*   Fasting Lipid Panel: No results found for this basename: CHOL, HDL, LDLCALC, TRIG, CHOLHDL, LDLDIRECT,  in the last 72 hours Thyroid Function Tests:  Recent Labs  06/23/13 0930  TSH 1.022   Anemia Panel: No results found for this basename: VITAMINB12, FOLATE, FERRITIN, TIBC, IRON, RETICCTPCT,  in the last 72 hours Coagulation: No results found for this basename: LABPROT, INR,  in the last 72 hours Urine Drug Screen: Drugs of Abuse  No results found for this basename: labopia, cocainscrnur, labbenz, amphetmu, thcu, labbarb    Alcohol Level: No results found for this basename: ETH,  in the last 72 hours Urinalysis:  Recent Labs  06/22/13 2340 06/24/13 0415  COLORURINE YELLOW YELLOW  LABSPEC 1.025 1.010  PHURINE 6.0 7.0  GLUCOSEU NEGATIVE NEGATIVE  HGBUR TRACE* SMALL*  BILIRUBINUR NEGATIVE NEGATIVE  KETONESUR 15* 15*  PROTEINUR NEGATIVE NEGATIVE  UROBILINOGEN 0.2 0.2  NITRITE NEGATIVE NEGATIVE  LEUKOCYTESUR SMALL* LARGE*   Misc. Labs:  ABGS No results found for this basename: PHART, PCO2, PO2ART, TCO2, HCO3,  in the last 72 hours CULTURES Recent Results (from the past 240 hour(s))  CULTURE, BLOOD (ROUTINE X 2)     Status: None   Collection Time  06/21/13 10:25 PM      Result Value Range Status   Specimen Description BLOOD RIGHT HAND   Final   Special Requests BOTTLES DRAWN AEROBIC ONLY 1CC   Final   Culture  Setup Time     Final   Value: 06/22/2013 04:30     Performed at [facility]   Culture     Final   Value:        BLOOD CULTURE RECEIVED NO GROWTH TO DATE CULTURE WILL BE HELD FOR 5 DAYS BEFORE ISSUING A FINAL NEGATIVE REPORT     Performed at [facility]   Report Status PENDING   Incomplete  CULTURE, BLOOD (ROUTINE X 2)     Status: None   Collection Time    06/21/13 11:00 PM      Result Value Range Status   Specimen Description BLOOD HAND RIGHT   Final   Special  Requests BOTTLES DRAWN AEROBIC ONLY 3CC   Final   Culture  Setup Time     Final   Value: 06/22/2013 04:30     Performed at [facility]   Culture     Final   Value:        BLOOD CULTURE RECEIVED NO GROWTH TO DATE CULTURE WILL BE HELD FOR 5 DAYS BEFORE ISSUING A FINAL NEGATIVE REPORT     Performed at [facility]   Report Status PENDING   Incomplete  URINE CULTURE     Status: None   Collection Time    06/21/13 11:00 PM      Result Value Range Status   Specimen Description URINE, CATHETERIZED   Final   Special Requests NONE   Final   Culture  Setup Time     Final   Value: 06/22/2013 07:09     Performed at [facility]     Final   Value: >=100,000 COLONIES/ML     Performed at [facility]   Culture     Final   Value: Multiple bacterial morphotypes present, none predominant. Suggest appropriate recollection if clinically indicated.     Performed at [facility]   Report Status 06/23/2013 FINAL   Final  MRSA PCR SCREENING     Status: None   Collection Time    06/23/13  4:00 AM      Result Value Range Status   MRSA by PCR NEGATIVE  NEGATIVE Final   Comment:            The GeneXpert MRSA Assay (FDA     approved for NASAL specimens     only), is one component of a     comprehensive MRSA colonization     surveillance program. It is not     intended to diagnose MRSA     infection nor to guide or     monitor treatment for     MRSA infections.   Studies/Results: Dg Chest Port 1 View  06/23/2013   *RADIOLOGY REPORT*  Clinical Data: 79 year old female PICC line placement.  PORTABLE CHEST - 1 VIEW  Comparison: Chest CT 0133 hours the same day and earlier.  Findings: Portable AP upright view at 1608 hours.  Right PICC line in place, tip at the level of the cavoatrial junction.  Increased interstitial opacity compared to 06/21/2013.  No pneumothorax, pleural effusion or consolidation.  Stable cardiac size and mediastinal contours.   IMPRESSION: 1.  Right upper extremity PICC line tip at the  cavoatrial junction level. 2.  Increased interstitial opacity since 06/21/2013 could reflect viral / atypical respiratory infection or vascular congestion.   Original Report Authenticated By: Roselyn Reef, M.D.    Medications:  Prior to Admission:  Prescriptions prior to admission  Medication Sig Dispense Refill  . acetaminophen (TYLENOL) 500 MG tablet Take 500 mg by mouth every 6 (six) hours as needed for pain.      Marland Kitchen ALPRAZolam (NIRAVAM) 0.25 MG dissolvable tablet Take 0.25 mg by mouth at bedtime as needed.        Marland Kitchen aspirin 325 MG tablet Take 325 mg by mouth daily.        . Calcium Carbonate-Vit D-Min 600-400 MG-UNIT TABS Take 1 tablet by mouth every morning.      . cholecalciferol (VITAMIN D) 1000 UNITS tablet Take 1,000 Units by mouth every morning.       . Coenzyme Q10 (CO Q 10) 100 MG CAPS Take 100 mg by mouth every morning.       . docusate sodium (COLACE) 100 MG capsule Take 100 mg by mouth 2 (two) times daily.      Marland Kitchen esomeprazole (NEXIUM) 40 MG capsule Take 40 mg by mouth at bedtime.       . fish oil-omega-3 fatty acids 1000 MG capsule Take 2 g by mouth every morning.       . glyBURIDE-metformin (GLUCOVANCE) 5-500 MG per tablet Take 2 tablets by mouth 2 (two) times daily with a meal.       . insulin glargine (LANTUS) 100 UNIT/ML injection Inject 2-8 Units into the skin at bedtime. Give if BS 140-150= 3 units, 151-160=4 units, 161-170=5 units, 171-180= 6 units, >181=7units      . metoprolol succinate (TOPROL-XL) 25 MG 24 hr tablet Take 1 tablet (25 mg total) by mouth daily.  30 tablet  3  . midodrine (PROAMATINE) 5 MG tablet Take 1 tablet (5 mg total) by mouth 3 (three) times daily with meals.  30 tablet  3  . mirtazapine (REMERON SOL-TAB) 15 MG disintegrating tablet Take 1 tablet (15 mg total) by mouth at bedtime.  30 tablet  3  . polyethylene glycol (MIRALAX / GLYCOLAX) packet Take 17 g by mouth daily as needed (Constipation).       Marland Kitchen spironolactone (ALDACTONE) 12.5 mg TABS Take 0.5 tablets (12.5 mg total) by mouth daily.  30 tablet  3  . tiotropium (SPIRIVA) 18 MCG inhalation capsule Place 18 mcg into inhaler and inhale daily as needed (shortness of breath).       Scheduled: . enoxaparin (LOVENOX) injection  40 mg Subcutaneous Q24H  . insulin aspart  0-5 Units Subcutaneous QHS  . insulin aspart  0-9 Units Subcutaneous TID WC  . lisinopril  20 mg Oral Daily  . megestrol  400 mg Oral BID  . metoprolol succinate  50 mg Oral Daily  . mirtazapine  30 mg Oral QHS  . omega-3 acid ethyl esters  2 capsule Oral q morning - 10a  . pantoprazole sodium  40 mg Oral Daily  . sodium chloride  10-40 mL Intracatheter Q12H   Continuous: . 0.9 % NaCl with KCl 20 mEq / L 100 mL/hr at 06/25/13 0854   TVI:[id number], butalbital-acetaminophen-caffeine, diphenhydrAMINE, ondansetron (ZOFRAN) IV, polyethylene glycol, sodium chloride  Assesment: She seems better. She was able to eat. Her delirium is better. I do think she probably has some depression and I increased her Remeron Active Problems:   DIABETES MELLITUS, TYPE II, UNCONTROLLED  HYPERTENSION   Hyponatremia   Dehydration   Failure to thrive   Acute delirium    Plan: No change in meds.    LOS: 3 days   Yaden Seith L 06/25/2013, 9:56 AM

## 2013-06-26 DIAGNOSIS — R7989 Other specified abnormal findings of blood chemistry: Secondary | ICD-10-CM

## 2013-06-26 DIAGNOSIS — R627 Adult failure to thrive: Secondary | ICD-10-CM

## 2013-06-26 LAB — HEPATIC FUNCTION PANEL
ALT: 36 U/L — ABNORMAL HIGH (ref 0–35)
AST: 40 U/L — ABNORMAL HIGH (ref 0–37)
Albumin: 3.1 g/dL — ABNORMAL LOW (ref 3.5–5.2)
Alkaline Phosphatase: 279 U/L — ABNORMAL HIGH (ref 39–117)
Bilirubin, Direct: <0.1 mg/dL (ref 0.0–0.3)
Total Bilirubin: 0.4 mg/dL (ref 0.3–1.2)
Total Protein: 6.8 g/dL (ref 6.0–8.3)

## 2013-06-26 LAB — ANA: Anti Nuclear Antibody(ANA): POSITIVE — AB

## 2013-06-26 LAB — ANTI-NUCLEAR AB-TITER (ANA TITER): ANA Titer 1: NEGATIVE

## 2013-06-26 MED ORDER — POLYETHYLENE GLYCOL 3350 17 G PO PACK
17.0000 g | PACK | Freq: Every day | ORAL | Status: AC
  Administered 2013-06-26 – 2013-06-27 (×2): 17 g via ORAL
  Filled 2013-06-26: qty 1

## 2013-06-26 NOTE — Evaluation (Signed)
Physical Therapy Evaluation Patient Details Name: [name] MRN: [id number] DOB: 07-31-1931 Today's Date: 06/26/2013 Time: 8453-6468 PT Time Calculation (min): 29 min  PT Assessment / Plan / Recommendation History of Present Illness  Pt s admitted with metabolic encephalopathy and orthostatic hypotension.  She lives with her son and daughter and has been a patient here, seen by PT, several times in the very recent past.  Clinical Impression   Pt was seen for evaluation.  Since othostasis has been a problem, she was measured for and fitted with thigh length TED stockings.  She was found to be mildly deconditioned but able to ambulate the length of the hallway using a walker with no instability.  I would recommend continuing HHPT at d/c.    PT Assessment  Patient needs continued PT services    Follow Up Recommendations  Home health PT    Does the patient have the potential to tolerate intense rehabilitation    no  Barriers to Discharge Inaccessible home environment needs stair training    Equipment Recommendations  None recommended by PT    Recommendations for Other Services     Frequency      Precautions / Restrictions Precautions Precautions: Fall Restrictions Weight Bearing Restrictions: No   Pertinent Vitals/Pain       Mobility  Bed Mobility Supine to Sit: 4: Min guard;HOB elevated Transfers Sit to Stand: 4: Min guard;With upper extremity assist;From bed;From toilet Stand to Sit: 5: Supervision;To chair/3-in-1 Ambulation/Gait Ambulation/Gait Assistance: 5: Supervision Ambulation Distance (Feet): 175 Feet Assistive device: Rolling walker Gait Pattern: Decreased hip/knee flexion - left;Decreased hip/knee flexion - right;Decreased stride length Gait velocity: gait is fluid with a walker Stairs: No Wheelchair Mobility Wheelchair Mobility: No    Exercises General Exercises - Lower Extremity Ankle Circles/Pumps: AROM;Both;10 reps;Supine Quad Sets:  AROM;Both;10 reps;Supine Gluteal Sets: AROM;Both;10 reps;Supine Short Arc Quad: AROM;Both;10 reps;Supine Heel Slides: AROM;Both;10 reps;Supine Hip ABduction/ADduction: AROM;Both;10 reps;Supine   PT Diagnosis: Difficulty walking;Generalized weakness  PT Problem List: Decreased strength;Decreased balance;Decreased mobility PT Treatment Interventions: Gait training;Stair training;Therapeutic activities;Therapeutic exercise;Balance training     PT Goals(Current goals can be found in the care plan section) Acute Rehab PT Goals Patient Stated Goal: none stated PT Goal Formulation: With patient/family Time For Goal Achievement: 06/26/13 Potential to Achieve Goals: Good  Visit Information  Last PT Received On: 06/26/13 History of Present Illness: Pt s admitted with metabolic encephalopathy and orthostatic hypotension.  She lives with her son and daughter and has been a patient here, seen by PT, several times in the very recent past.       Prior [place] Additional Comments: pt has been using a walker at home    Cognition  Cognition Arousal/Alertness: Awake/alert Behavior During Therapy: WFL for tasks assessed/performed Overall Cognitive Status: Within Functional Limits for tasks assessed    Extremity/Trunk Assessment Lower Extremity Assessment Lower Extremity Assessment: Generalized weakness Cervical / Trunk Assessment Cervical / Trunk Assessment: Kyphotic   Balance Balance Balance Assessed: Yes Static Standing Balance Static Standing - Balance Support: No upper extremity supported Static Standing - Level of Assistance: 5: Stand by assistance  End of Session PT - End of Session Equipment Utilized During Treatment: Gait belt Activity Tolerance: Patient tolerated treatment well Patient left: in chair;with call bell/phone within reach;with family/visitor present Nurse Communication: Mobility status  GP     Sable Feil 06/26/2013, 3:39 PM

## 2013-06-26 NOTE — Progress Notes (Signed)
   Primary cardiologist: Dr. Darlina Guys  Subjective: Feels well. No complaints.  LABS: Basic Metabolic Panel:  Recent Labs  06/24/13 0513  NA 137  K 3.3*  CL 101  CO2 23  GLUCOSE 111*  BUN 7  CREATININE 0.51  CALCIUM 9.0   Liver Function Tests:  Recent Labs  06/24/13 0513 06/26/13 0834  AST 24 40*  ALT 31 36*  ALKPHOS 302* 279*  BILITOT 0.9 0.4  PROT 7.0 6.8  ALBUMIN 3.3* 3.1*     Recent Labs  06/24/13 0513  WBC 6.5  HGB 12.2  HCT 35.9*  MCV 95.2  PLT 371    PHYSICAL EXAM BP 150/66  Pulse 73  Temp(Src) 98.9 F (37.2 C) (Oral)  Resp 20  Ht _0  (1.6 m)  Wt 141 lb 15.6 oz (64.4 kg)  BMI 25.16 kg/m2  SpO2 100% General: Well developed, well nourished, in no acute distress  Lungs: Mild crackles in the bases, but no wheezes or coughing. Heart: RRR S1 S2, No MRG Extremities: No clubbing, cyanosis or edema.  DP +1  TELEMETRY: NSR rates in the 70's.   ASSESSMENT AND PLAN:  1. PSVT: Noted previously - see consult note. She is now in NSR with metoprolol 50 mg daily (increased from 25 mg daily.). HR well controlled. Continue current regimen. Plans for d/c in am. Cardiology signing off.  2. Hypertension: BP mildly elevated over last 24 hours. Remain on lisinopril 20 mg daily. Consider low dose amlodipine 2.5 mg if remains consistently above 811 mmHg systolic.  3.Metabolic Encephalopathy: Resolved.  Phill Myron. Purcell Nails NP [facility] 06/26/2013, 9:49 AM   Attending note:  Please refer to original consult note. PSVT as documented, possibly reentrant mechanism. Beta blocker is being pursued at this time, currently sinus rhythm noted on telemetry. No further inpatient cardiac workup at this time. Patient can keep followup as an outpatient with [name] as before.  Satira Sark, M.D., F.A.C.C.

## 2013-06-26 NOTE — Progress Notes (Signed)
Patient ID: [name], female   DOB: 03/09/1931, 79 y.o.   MRN: [id number]  Michele A. Merlene Laughter, MD     [email or web address]          [name] is an 79 y.o. female.   Assessment/Plan: 1. Resolved metabolic encephalopathy 2. Severe extracranial occlusive disease. She is maintained on antiplatelet agents and statins. 3. 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 4. Headaches.  The patient is feeling a lot better today. She is awake and alert and is oriented to person, place, year and month. She has no complaints. No complaints of headache today. She moves both sides well. She was able to sit up in a chair today and is eating well. Again I believe the patient has a mild encephalopathy which seems to have resolved.      Objective: Vital signs in last 24 hours: Temp:  [98.3 F (36.8 C)-99.2 F (37.3 C)] 98.3 F (36.8 C) (08/11 0533) Pulse Rate:  [71-79] 79 (08/11 0539) Resp:  [20] 20 (08/11 0539) BP: (146-200)/(75-92) 200/92 mmHg (08/11 0539) SpO2:  [95 %-100 %] 97 % (08/11 0539) Weight:  [64.4 kg (141 lb 15.6 oz)] 64.4 kg (141 lb 15.6 oz) (08/11 0348)  Intake/Output from previous day: 08/10 0701 - 08/11 0700 In: 960 [P.O.:960] Out: -  Intake/Output this shift:   Nutritional status: Carb Control   Lab Results: Results for orders placed during the hospital encounter of 06/22/13 (from the past 48 hour(s))  GLUCOSE, CAPILLARY     Status: None   Collection Time    06/24/13 11:21 AM      Result Value Range   Glucose-Capillary 89  70 - 99 mg/dL   Comment 1 Notify RN    GLUCOSE, CAPILLARY     Status: Abnormal   Collection Time    06/24/13  4:30 PM      Result Value Range   Glucose-Capillary 301 (*) 70 - 99 mg/dL   Comment 1 Notify RN    GLUCOSE, CAPILLARY     Status: Abnormal   Collection Time    06/24/13  8:13 PM      Result Value Range   Glucose-Capillary 111 (*) 70 - 99 mg/dL   Comment 1 Notify RN     Comment 2 Documented in Chart    SEDIMENTATION RATE     Status: None   Collection Time    06/25/13  5:27 AM      Result Value Range   Sed Rate 12  0 - 22 mm/hr  GLUCOSE, CAPILLARY     Status: Abnormal   Collection Time    06/25/13  7:41 AM      Result Value Range   Glucose-Capillary 140 (*) 70 - 99 mg/dL   Comment 1 Notify RN    GLUCOSE, CAPILLARY     Status: Abnormal   Collection Time    06/25/13 11:43 AM      Result Value Range   Glucose-Capillary 303 (*) 70 - 99 mg/dL   Comment 1 Notify RN    GLUCOSE, CAPILLARY     Status: None   Collection Time    06/25/13  4:04 PM      Result Value Range   Glucose-Capillary 76  70 - 99 mg/dL   Comment 1 Notify RN     Comment 2 Documented in Chart    GLUCOSE, CAPILLARY     Status: Abnormal   Collection Time    06/25/13  9:05 PM  Result Value Range   Glucose-Capillary 273 (*) 70 - 99 mg/dL   Comment 1 Notify RN     Comment 2 Documented in Chart      Lipid Panel No results found for this basename: CHOL, TRIG, HDL, CHOLHDL, VLDL, LDLCALC,  in the last 72 hours  Studies/Results: No results found.  Medications:  Scheduled Meds: . enoxaparin (LOVENOX) injection  40 mg Subcutaneous Q24H  . insulin aspart  0-5 Units Subcutaneous QHS  . insulin aspart  0-9 Units Subcutaneous TID WC  . lisinopril  20 mg Oral Daily  . megestrol  400 mg Oral BID  . metoprolol succinate  50 mg Oral Daily  . mirtazapine  30 mg Oral QHS  . omega-3 acid ethyl esters  2 capsule Oral q morning - 10a  . pantoprazole sodium  40 mg Oral Daily  . sodium chloride  10-40 mL Intracatheter Q12H   Continuous Infusions: . 0.9 % NaCl with KCl 20 mEq / L 100 mL/hr at 06/25/13 1915   PRN Meds:.acetaminophen, butalbital-acetaminophen-caffeine, diphenhydrAMINE, ondansetron (ZOFRAN) IV, polyethylene glycol, sodium chloride      LOS: 4 days   [name] A. Merlene Chambers, M.D.  Diplomate, Tax adviser of Psychiatry and Neurology ( Neurology).

## 2013-06-26 NOTE — Care Management Note (Signed)
    Page 1 of 2   06/27/2013     8:24:28 AM   CARE MANAGEMENT NOTE 06/27/2013  Patient:  [name], [name]   Account Number:  [id number]  Date Initiated:  06/26/2013  Documentation initiated by:  Theophilus Kinds  Subjective/Objective Assessment:   Pt admitted from home with metabolic encephalopathy. Pt lives with her son and daughter. Pt has walker and BSC for home use. Pt is active with [facility].     Action/Plan:   Will continue to follow for [facility] needs. PT consult obtained. Pts son is interested in placement and CSW is aware.   Anticipated DC Date:  06/28/2013   Anticipated DC Plan:  SKILLED NURSING FACILITY  In-house referral  Clinical Social Worker      DC Forensic scientist  CM consult      [facility] Choice  HOME HEALTH   Choice offered to / List presented to:  C-1 Patient        [place] arranged  HH-1 RN  [place].   Status of service:  Completed, signed off Medicare Important Message given?  YES (If response is "NO", the following Medicare IM given date fields will be blank) Date Medicare IM given:  06/27/2013 Date Additional Medicare IM given:    Discharge Disposition:  [place]  Per UR Regulation:    If discussed at Long Length of Stay Meetings, dates discussed:    Comments:  06/27/13 0820 Christinia Gully, RN BSN CM Pt discharged home today with resumption of [facility] RN and PT. [facility] staff will be notified of discharge and they will collect the pts information from the chart. No DME needs noted. Pts daughter interested in Meals on Wheels and number given to pts daughter to make that contact and work out payment options for meals. Pt and pts nurse aware of discharge arrangements.  06/26/13 [place], RN BSN CM

## 2013-06-26 NOTE — Progress Notes (Signed)
Inpatient Diabetes Program Recommendations  AACE/ADA: New Consensus Statement on Inpatient Glycemic Control (2013)  Target Ranges:  Prepandial:   less than 140 mg/dL      Peak postprandial:   less than 180 mg/dL (1-2 hours)      Critically ill patients:  140 - 180 mg/dL   Results for [name], [name] (MRN [id number]) as of 06/26/2013 08:08  Ref. Range 06/25/2013 07:41 06/25/2013 11:43 06/25/2013 16:04 06/25/2013 21:05  Glucose-Capillary Latest Range: 70-99 mg/dL 140 (H) 303 (H) 76 273 (H)    Inpatient Diabetes Program Recommendations Insulin - Basal: Please consider ordering low dose basal insulin; recommend starting with Lantus 4 units QHS.  Note: Patient has a history of diabetes and takes Glyburide-Metformin 5-500 mg BID and Lantus 2-8 units HS at home for diabetes management.  Currently, patient is ordered to receive Novolog 0-9 units AC and Novolog 0-5 units HS for inpatient glycemic control.  Patient received a total of Novolog 11 units correction on 8/10 and patient is eating 75-100% of meals.  Please consider ordering low dose basal insulin; recommend starting with Lantus 4 units QHS.  Will continue to follow.  Thanks, Barnie Alderman, RN, MSN, CCRN Diabetes Coordinator Inpatient Diabetes Program [phone]

## 2013-06-26 NOTE — Progress Notes (Signed)
Subjective: Patient feels much better. Her mental status has improved. She ambulating and her po intake has improved. No new complaint Objective: Vital signs in last 24 hours: Temp:  [98.3 F (36.8 C)-99.2 F (37.3 C)] 98.9 F (37.2 C) (08/11 1520) Pulse Rate:  [61-79] 61 (08/11 1520) Resp:  [20] 20 (08/11 1520) BP: (146-200)/(66-92) 187/88 mmHg (08/11 1520) SpO2:  [95 %-100 %] 98 % (08/11 1520) Weight:  [64.4 kg (141 lb 15.6 oz)] 64.4 kg (141 lb 15.6 oz) (08/11 0348) Weight change: 0.5 kg (1 lb 1.6 oz) Last BM Date: 06/22/13 (Miralax given as ordered)  Intake/Output from previous day: 08/10 0701 - 08/11 0700 In: 38 [P.O.:960] Out: -   PHYSICAL EXAM General appearance: alert, cooperative, cachectic and no distress Resp: clear to auscultation bilaterally Cardio: S1 and S2 heard, regular GI: soft, non-tender; bowel sounds normal; no masses,  no organomegaly Extremities: extremities normal, atraumatic, no cyanosis or edema  Lab Results:    _0 @ [facility] No results found for this basename: PHART, PCO2, PO2ART, TCO2, HCO3,  in the last 72 hours CULTURES Recent Results (from the past 240 hour(s))  CULTURE, BLOOD (ROUTINE X 2)     Status: None   Collection Time    06/21/13 10:25 PM      Result Value Range Status   Specimen Description BLOOD RIGHT HAND   Final   Special Requests BOTTLES DRAWN AEROBIC ONLY 1CC   Final   Culture  Setup Time     Final   Value: 06/22/2013 04:30     Performed at [facility]   Culture     Final   Value:        BLOOD CULTURE RECEIVED NO GROWTH TO DATE CULTURE WILL BE HELD FOR 5 DAYS BEFORE ISSUING A FINAL NEGATIVE REPORT     Performed at [facility]   Report Status PENDING   Incomplete  CULTURE, BLOOD (ROUTINE X 2)     Status: None   Collection Time    06/21/13 11:00 PM      Result Value Range Status   Specimen Description BLOOD HAND RIGHT   Final   Special Requests BOTTLES DRAWN AEROBIC ONLY 3CC   Final   Culture  Setup  Time     Final   Value: 06/22/2013 04:30     Performed at [facility]   Culture     Final   Value:        BLOOD CULTURE RECEIVED NO GROWTH TO DATE CULTURE WILL BE HELD FOR 5 DAYS BEFORE ISSUING A FINAL NEGATIVE REPORT     Performed at [facility]   Report Status PENDING   Incomplete  URINE CULTURE     Status: None   Collection Time    06/21/13 11:00 PM      Result Value Range Status   Specimen Description URINE, CATHETERIZED   Final   Special Requests NONE   Final   Culture  Setup Time     Final   Value: 06/22/2013 07:09     Performed at [facility] Count     Final   Value: >=100,000 COLONIES/ML     Performed at [facility]   Culture     Final   Value: Multiple bacterial morphotypes present, none predominant. Suggest appropriate recollection if clinically indicated.     Performed at [facility]   Report Status 06/23/2013 FINAL   Final  MRSA PCR SCREENING  Status: None   Collection Time    06/23/13  4:00 AM      Result Value Range Status   MRSA by PCR NEGATIVE  NEGATIVE Final   Comment:            The GeneXpert MRSA Assay (FDA     approved for NASAL specimens     only), is one component of a     comprehensive MRSA colonization     surveillance program. It is not     intended to diagnose MRSA     infection nor to guide or     monitor treatment for     MRSA infections.  URINE CULTURE     Status: None   Collection Time    06/24/13  4:15 AM      Result Value Range Status   Specimen Description URINE, CLEAN CATCH   Final   Special Requests NONE   Final   Culture  Setup Time     Final   Value: 06/24/2013 18:57     Performed at [place]     Final   Value: NO GROWTH     Performed at [facility]   Culture     Final   Value: NO GROWTH     Performed at [facility]   Report Status 06/25/2013 FINAL   Final   Studies/Results: No results found.  Medications: I have reviewed  the patient's current medications.  Assesment:  Active Problems:   DIABETES MELLITUS, TYPE II, UNCONTROLLED   HYPERTENSION   Hyponatremia   Dehydration   Failure to thrive   Acute delirium elevated LFT   Plan: Medications reviewed Will D/C Iv fluid Nutritional support Supportive care   LOS: 4 days   Shykeem Resurreccion 06/26/2013, 4:23 PM

## 2013-06-26 NOTE — Progress Notes (Signed)
Subjective:  Feels much better. Eating about 75-100% of all meals. Appetite is good. Denies abdominal pain, diarrhea, nausea. Alert and oriented. Son at bedside.   Objective: Vital signs in last 24 hours: Temp:  [98.3 F (36.8 C)-99.2 F (37.3 C)] 98.9 F (37.2 C) (08/11 0920) Pulse Rate:  [71-79] 73 (08/11 0920) Resp:  [20] 20 (08/11 0920) BP: (146-200)/(66-92) 150/66 mmHg (08/11 0920) SpO2:  [95 %-100 %] 100 % (08/11 0920) Weight:  [141 lb 15.6 oz (64.4 kg)] 141 lb 15.6 oz (64.4 kg) (08/11 0348) Last BM Date: 06/24/13 General:   Alert,  Well-developed, well-nourished, pleasant and cooperative in NAD Head:  Normocephalic and atraumatic. Eyes:  Sclera clear, no icterus.   Abdomen:  Soft, nontender and nondistended. Normal bowel sounds, without guarding, and without rebound.   Extremities:  Without clubbing, deformity or edema. Neurologic:  Alert and  oriented x4;  grossly normal neurologically. Skin:  Intact without significant lesions or rashes. Psych:  Alert and cooperative. Normal mood and affect.  Intake/Output from previous day: 08/10 0701 - 08/11 0700 In: 960 [P.O.:960] Out: -  Intake/Output this shift: Total I/O In: 240 [P.O.:240] Out: -   Lab Results: CBC  Recent Labs  06/24/13 0513  WBC 6.5  HGB 12.2  HCT 35.9*  MCV 95.2  PLT 371   BMET  Recent Labs  06/24/13 0513  NA 137  K 3.3*  CL 101  CO2 23  GLUCOSE 111*  BUN 7  CREATININE 0.51  CALCIUM 9.0   LFTs  Recent Labs  06/24/13 0513 06/26/13 0834  BILITOT 0.9 0.4  BILIDIR 0.2 <0.1  IBILI 0.7 NOT CALCULATED  ALKPHOS 302* 279*  AST 24 40*  ALT 31 36*  PROT 7.0 6.8  ALBUMIN 3.3* 3.1*   No results found for this basename: LIPASE,  in the last 72 hours PT/INR No results found for this basename: LABPROT, INR,  in the last 72 hours    Imaging Studies: US Abdomen Complete  06/15/2013   *RADIOLOGY REPORT*  Clinical Data:  Elevated LFTs, diabetes, hypertension, prior cholecystectomy   ABDOMINAL ULTRASOUND COMPLETE  Comparison:  62,014  Findings:  Gallbladder:  Prior cholecystectomy.  Common Bile Duct:  Within normal limits in caliber.  Liver: No focal mass lesion identified.  Within normal limits in parenchymal echogenicity. Patent portal vein with normal hepato pedal flow.  IVC:  Appears normal.  Pancreas:  No abnormality identified.  Spleen:  Within normal limits in size and echotexture.  Right kidney:  Normal in size and parenchymal echogenicity.  No evidence of mass or hydronephrosis. Minor pelvic fullness noted.  Left kidney:  Normal in size and parenchymal echogenicity.  No evidence of mass or hydronephrosis.  Incidental upper pole 10 mm cyst noted.  Abdominal Aorta:  No aneurysm identified.  IMPRESSION: Prior cholecystectomy.  No acute finding by ultrasound.   Original Report Authenticated By: Jerilynn Mages. Annamaria Boots, M.D.        Assessment: 79 y/o female with diminished oral intake, anorexia, mental status changes, hyponatremia, abnormal LFTs. Intermittent abnormal LFTs, more pronounced latter part of June 17, 2023. CT and U/S imaging unremarkable. No prior EGD.   Oral intake improved since admission. Now on Megace. Mental status improved as well.   LFTs today with mildly elevated AST/ALT, improved alkphos.  Plan: 1. Followup AMA and autoimmune markers. 2. Consider PT evaluation and ambulation.  3. Miralax 17 grams today and tomorrow.   LOS: 4 days   Neil Crouch  06/26/2013, 10:02 AM  Attending note: Patient  seen and examined. Agree with plan as outlined.  If autoimmune workup is negative and LFTs continue to fluctuate significantly, would consider an MRCP to look for a small occult common bile duct stone.

## 2013-06-27 LAB — GLUCOSE, CAPILLARY
Glucose-Capillary: 164 mg/dL — ABNORMAL HIGH (ref 70–99)
Glucose-Capillary: 199 mg/dL — ABNORMAL HIGH (ref 70–99)
Glucose-Capillary: 265 mg/dL — ABNORMAL HIGH (ref 70–99)
Glucose-Capillary: 226 mg/dL — ABNORMAL HIGH (ref 70–99)

## 2013-06-27 LAB — ANTI-SMOOTH MUSCLE ANTIBODY, IGG: F-Actin IgG: 10 U (ref <20)

## 2013-06-27 LAB — MITOCHONDRIAL ANTIBODIES: Mitochondrial M2 Ab, IgG: 3.22 — ABNORMAL HIGH (ref <0.91)

## 2013-06-27 MED ORDER — MIRTAZAPINE 30 MG PO TBDP: 30.0000 mg | ORAL_TABLET | Freq: Every day | ORAL | Status: DC

## 2013-06-27 MED ORDER — MEGESTROL ACETATE 400 MG/10ML PO SUSP: 400.0000 mg | Freq: Two times a day (BID) | ORAL | Status: DC

## 2013-06-27 MED ORDER — METOPROLOL SUCCINATE ER 50 MG PO TB24: 50.0000 mg | ORAL_TABLET | Freq: Every day | ORAL | Status: DC

## 2013-06-27 MED ORDER — LISINOPRIL 20 MG PO TABS: 20.0000 mg | ORAL_TABLET | Freq: Every day | ORAL | Status: DC

## 2013-06-27 NOTE — Progress Notes (Signed)
UR chart review completed.

## 2013-06-27 NOTE — Progress Notes (Signed)
PT ALERT AND ORIENTED. DAUGHTER AT BEDSIDE. SKIN WARM AND DRY. ATE ALL OF BREAKFAST. PICC LINE D/C'D PER PROTOCOL BY JESSICA BUCNER RN 3. DISCHARE INSTRUCTIONS REVIEWED W/ PT AND DAUGHTER. D/C HOME VIA W/C

## 2013-06-27 NOTE — Discharge Summary (Signed)
Physician Discharge Summary  Patient ID: [name] MRN: [id number] DOB/AGE: Mar 17, 1931 79 y.o. Primary Care Physician:Mickayla Trouten, MD Admit date: 06/22/2013 Discharge date: 06/27/2013    Discharge Diagnoses:   Active Problems:   DIABETES MELLITUS, TYPE II, UNCONTROLLED   HYPERTENSION   Hyponatremia   Dehydration   Failure to thrive   Acute delirium elevated LFT   Medication List    STOP taking these medications       midodrine 5 MG tablet  Commonly known as:  PROAMATINE      TAKE these medications       acetaminophen 500 MG tablet  Commonly known as:  TYLENOL  Take 500 mg by mouth every 6 (six) hours as needed for pain.     ALPRAZolam 0.25 MG dissolvable tablet  Commonly known as:  NIRAVAM  Take 0.25 mg by mouth at bedtime as needed.     aspirin 325 MG tablet  Take 325 mg by mouth daily.     Calcium Carbonate-Vit D-Min 600-400 MG-UNIT Tabs  Take 1 tablet by mouth every morning.     cholecalciferol 1000 UNITS tablet  Commonly known as:  VITAMIN D  Take 1,000 Units by mouth every morning.     Co Q 10 100 MG Caps  Take 100 mg by mouth every morning.     docusate sodium 100 MG capsule  Commonly known as:  COLACE  Take 100 mg by mouth 2 (two) times daily.     esomeprazole 40 MG capsule  Commonly known as:  NEXIUM  Take 40 mg by mouth at bedtime.     fish oil-omega-3 fatty acids 1000 MG capsule  Take 2 g by mouth every morning.     glyBURIDE-metformin 5-500 MG per tablet  Commonly known as:  GLUCOVANCE  Take 2 tablets by mouth 2 (two) times daily with a meal.     insulin glargine 100 UNIT/ML injection  Commonly known as:  LANTUS  Inject 2-8 Units into the skin at bedtime. Give if BS 140-150= 3 units, 151-160=4 units, 161-170=5 units, 171-180= 6 units, >181=7units     lisinopril 20 MG tablet  Commonly known as:  PRINIVIL,ZESTRIL  Take 1 tablet (20 mg total) by mouth daily.     megestrol 400 MG/10ML suspension  Commonly known as:  MEGACE  Take  10 mLs (400 mg total) by mouth 2 (two) times daily.     metoprolol succinate 50 MG 24 hr tablet  Commonly known as:  TOPROL-XL  Take 1 tablet (50 mg total) by mouth daily. Take with or immediately following a meal.     mirtazapine 30 MG disintegrating tablet  Commonly known as:  REMERON SOL-TAB  Take 1 tablet (30 mg total) by mouth at bedtime.     polyethylene glycol packet  Commonly known as:  MIRALAX / GLYCOLAX  Take 17 g by mouth daily as needed (Constipation).     spironolactone 12.5 mg Tabs tablet  Commonly known as:  ALDACTONE  Take 0.5 tablets (12.5 mg total) by mouth daily.     tiotropium 18 MCG inhalation capsule  Commonly known as:  SPIRIVA  Place 18 mcg into inhaler and inhale daily as needed (shortness of breath).        Discharged Condition: improved     Consults: Cardiology, GI & neurology  Significant Diagnostic Studies: Dg Chest 2 View  06/23/2013   *RADIOLOGY REPORT*  Clinical Data: Headache and weakness.  Slurred speech.  CHEST - 2 VIEW  Comparison: CT chest  03/29/2010 and portable chest 06/21/2013.  Findings: The lungs are emphysematous.  There is cardiomegaly and vascular congestion.  Right lower lobe airspace disease is identified.  IMPRESSION:  1.  Right lower lobe airspace disease worrisome for pneumonia. 2.  Cardiomegaly and vascular congestion. 3.  Emphysema.   Original Report Authenticated By: Orlean Patten, M.D.   Dg Chest 2 View  06/15/2013   *RADIOLOGY REPORT*  Clinical Data: Fall, weakness, diabetes  CHEST - 2 VIEW  Comparison: 06/12/2013, 03/28/2010  Findings: Chronic hyperinflation with central bronchitic changes and interstitial prominence.  Mild cardiac enlargement without definite CHF.  No collapse, definite pneumonia, effusion or pneumothorax.  Trachea is midline.  Atherosclerosis of the aorta.  IMPRESSION: Stable chronic changes and COPD/emphysema.  No superimposed acute process   Original Report Authenticated By: Jerilynn Mages. Annamaria Boots, M.D.   Ct Head  Wo Contrast  06/23/2013   *RADIOLOGY REPORT*  Clinical Data: Weakness, fatigue, headache  CT HEAD WITHOUT CONTRAST  Technique:  Contiguous axial images were obtained from the base of the skull through the vertex without contrast.  Comparison: 06/21/2013  Findings: Age-related brain atrophy.  Periventricular chronic microvascular changes, worse on the left.  No acute intracranial hemorrhage, mass lesion, infarction, midline shift, herniation, or extra-axial fluid collection.  Cisterns patent.  No cerebellar abnormality.  Orbits unremarkable.  Mastoids and sinuses clear.  IMPRESSION: Stable atrophy and microvascular changes.   Original Report Authenticated By: Jerilynn Mages. Annamaria Boots, M.D.   Ct Head Wo Contrast  06/22/2013   *RADIOLOGY REPORT*  Clinical Data: Headache  CT HEAD WITHOUT CONTRAST  Technique:  Contiguous axial images were obtained from the base of the skull through the vertex without contrast.  Comparison: 06/15/2013  Findings: Limited with motion artifact through the posterior fossa and skull base.  Age-related brain atrophy with chronic microvascular ischemic changes in the white matter diffusely.  No acute intracranial hemorrhage, mass lesion, definite infarction, midline shift, herniation, hydrocephalus, or extra-axial fluid collection.  Cisterns patent.  No cerebellar abnormality.  Mastoid sinuses grossly clear.  IMPRESSION: Stable atrophy and microvascular ischemic changes.  Limited with motion artifact.   Original Report Authenticated By: Jerilynn Mages. Annamaria Boots, M.D.   Ct Head Wo Contrast  06/15/2013   *RADIOLOGY REPORT*  Clinical Data:  Patient fell while walking down the stairs.  Right neck soreness.  CT HEAD WITHOUT CONTRAST CT CERVICAL SPINE WITHOUT CONTRAST  Technique:  Multidetector CT imaging of the head and cervical spine was performed following the standard protocol without intravenous contrast.  Multiplanar CT image reconstructions of the cervical spine were also generated.  Comparison:  CT brain 06/12/2013; MR  brain 06/12/2013.  CT HEAD  Findings: Mild periventricular white matter hypodensity compatible with chronic small vessel ischemic change.  No mass lesion, hemorrhage, or acute infarction. Unchanged calcified lesion within the right frontal region.  The mastoid air cells are well-aerated. Paranasal sinuses are unremarkable.  Visualized aspect of the globes are intact. Mild soft tissue swelling overlying the right posterior aspect of the skull without underlying fracture.  IMPRESSION:  Chronic ischemic white matter disease without acute intracranial abnormality.  CT CERVICAL SPINE  Findings: Normal anatomic alignment of the cervical spine without evidence for acute fracture.  Craniocervical junction is intact. Mild multilevel degenerative disc and facet disease most pronounced at C3-4.  Lungs demonstrate no significant interval change biapical emphysema with right greater than left apical scarring dating back to 2011.  There is a 2.1 x 1.7 cm soft tissue mass within the right parotid gland, better characterized on prior  MRI.  IMPRESSION: 1. No evidence for acute cervical spine fracture.  2.  Multilevel degenerative changes.  3. Right parotid gland soft tissue mass, better characterized on prior MRI.   Original Report Authenticated By: Lovey Newcomer, M.D   Ct Head Wo Contrast - If Head Trauma Is Known/suspected And/or Pt Is Taking Anticoagulant  06/12/2013   *RADIOLOGY REPORT*  Clinical Data: Tachycardia.  Lower extremity weakness.  CT HEAD WITHOUT CONTRAST  Technique:  Contiguous axial images were obtained from the base of the skull through the vertex without contrast.  Comparison: 03/28/2010.  Findings: No mass lesion, mass effect, midline shift, hydrocephalus, hemorrhage.  No acute territorial cortical ischemia/infarct. Atrophy and chronic ischemic white matter disease is present.  Calcified scalp lesion in the right frontal region. Vertebral basilar dolichoectasia. Mastoid air cells are within normal limits.   Paranasal sinuses normal.  Calvarium intact.  IMPRESSION: Atrophy and chronic ischemic white matter disease without acute intracranial abnormality.   Original Report Authenticated By: Dereck Ligas, M.D.   Ct Chest Wo Contrast  06/23/2013   *RADIOLOGY REPORT*  Clinical Data: Weakness and fatigue.  Possible pneumonia but chest x-ray.  CT CHEST WITHOUT CONTRAST  Technique:  Multidetector CT imaging of the chest was performed following the standard protocol without IV contrast.  Comparison: PA and lateral chest earlier this same day and CT chest 03/29/2010.  Findings: There is no axillary, hilar or mediastinal lymphadenopathy.  Heart size is mildly enlarged.  No pleural or pericardial effusion is seen.  Emphysematous change is seen in the lung apices.  No consolidative process is identified.  Mild dependent atelectasis is noted.  Visualized upper abdomen is unremarkable.  No focal bony abnormality is identified.  IMPRESSION:  1.  Negative for pneumonia. 2.  Emphysema. 3.  Mild cardiomegaly.   Original Report Authenticated By: Orlean Patten, M.D.   Ct Cervical Spine Wo Contrast  06/15/2013   *RADIOLOGY REPORT*  Clinical Data:  Patient fell while walking down the stairs.  Right neck soreness.  CT HEAD WITHOUT CONTRAST CT CERVICAL SPINE WITHOUT CONTRAST  Technique:  Multidetector CT imaging of the head and cervical spine was performed following the standard protocol without intravenous contrast.  Multiplanar CT image reconstructions of the cervical spine were also generated.  Comparison:  CT brain 06/12/2013; MR brain 06/12/2013.  CT HEAD  Findings: Mild periventricular white matter hypodensity compatible with chronic small vessel ischemic change.  No mass lesion, hemorrhage, or acute infarction. Unchanged calcified lesion within the right frontal region.  The mastoid air cells are well-aerated. Paranasal sinuses are unremarkable.  Visualized aspect of the globes are intact. Mild soft tissue swelling overlying  the right posterior aspect of the skull without underlying fracture.  IMPRESSION:  Chronic ischemic white matter disease without acute intracranial abnormality.  CT CERVICAL SPINE  Findings: Normal anatomic alignment of the cervical spine without evidence for acute fracture.  Craniocervical junction is intact. Mild multilevel degenerative disc and facet disease most pronounced at C3-4.  Lungs demonstrate no significant interval change biapical emphysema with right greater than left apical scarring dating back to 2011.  There is a 2.1 x 1.7 cm soft tissue mass within the right parotid gland, better characterized on prior MRI.  IMPRESSION: 1. No evidence for acute cervical spine fracture.  2.  Multilevel degenerative changes.  3. Right parotid gland soft tissue mass, better characterized on prior MRI.   Original Report Authenticated By: Lovey Newcomer, M.D   Mr [facility] Wo Contrast  06/12/2013   *RADIOLOGY  REPORT*  Clinical Data: Leg weakness.  Rule out basilar stenosis  MRA HEAD WITHOUT CONTRAST  Technique: Angiographic images of the Circle of Willis were obtained using MRA technique without intravenous contrast.  Comparison: MRI brain today.  CT head 06/04/2013  Findings: Both vertebral arteries are widely patent.  Basilar artery is widely patent.  Vertebral arteries are similar in size of relatively large in size to severe anterior circulation disease. The posterior cerebral arteries are patent bilaterally.  The right internal carotid artery is occluded through the cavernous segment and reconstitutes via of the right posterior communicating artery which is a prominent vessel.  The anterior and middle cerebral arteries are patent on the right.  The left internal carotid artery is probably patent but appears small with low flow.  This suggests a severe stenosis of the left internal carotid artery in the neck.  The left posterior communicator artery is patent and supplies the anterior circulation.  The left middle  cerebral artery is small due to diffuse disease.  There is decreased perfusion of left middle cerebral artery territory.  There is a prominent left ophthalmic artery which is most likely a collateral source of circulation to the left internal carotid artery.  Negative for aneurysm.  IMPRESSION: Occlusion of the right internal carotid artery.  The right anterior and middle cerebral arteries are supplied via the right posterior communicating artery.  The left internal carotid artery shows decreased flow and decreased caliber due to slow flow suggesting a severe proximal internal carotid artery stenosis in the neck. Carotid Doppler suggest left internal carotid artery occlusion.  Consider CTA of the head and neck for further evaluation to determine severe stenosis versus occlusion.  In addition, there appears to be atherosclerotic disease throughout the left M1 segment.  Widely patent posterior circulation.   Original Report Authenticated By: Carl Best, M.D.   Mr Jeri Cos MG Contrast  06/12/2013   *RADIOLOGY REPORT*  Clinical Data: Leg weakness.  Hypertension diabetes and hyperlipidemia  MRI HEAD WITHOUT AND WITH CONTRAST  Technique:  Multiplanar, multiecho pulse sequences of the brain and surrounding structures were obtained according to standard protocol without and with intravenous contrast  Contrast: 43m MULTIHANCE GADOBENATE DIMEGLUMINE 529 MG/ML IV SOLN  Comparison: CT 06/12/2013  Findings: Negative for acute infarct.  Chronic microvascular ischemic changes are present, asymmetric on the left.  Small chronic infarct left frontal lobe.  Brainstem and cerebellum are negative.  Negative for intracranial hemorrhage or mass lesion.  Age appropriate atrophy.  Negative for hydrocephalus.  Soft tissue mass in the right posterior parotid gland measures 16 x 30 mm on sagittal images.  This appears larger compared with 2007 MRI of the neck which also identified this lesion.  Postcontrast imaging reveals normal  enhancement.  No intracranial mass lesion is identified.  IMPRESSION: Atrophy and chronic microvascular ischemia, asymmetric on the left suggestive of chronic MCA ischemia.  Mass lesion in the right posterior parotid gland, enlarged in 2007. 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   [place]Abdomen Complete  06/15/2013   *RADIOLOGY REPORT*  Clinical Data:  Elevated LFTs, diabetes, hypertension, prior cholecystectomy  ABDOMINAL ULTRASOUND COMPLETE  Comparison:  62,014  Findings:  Gallbladder:  Prior cholecystectomy.  Common Bile Duct:  Within normal limits in caliber.  Liver: No focal mass lesion identified.  Within normal limits in parenchymal echogenicity. Patent portal vein with normal hepato pedal flow.  IVC:  Appears normal.  Pancreas:  No abnormality identified.  Spleen:  Within normal limits in size and echotexture.  Right kidney:  Normal in size and parenchymal echogenicity.  No evidence of mass or hydronephrosis. Minor pelvic fullness noted.  Left kidney:  Normal in size and parenchymal echogenicity.  No evidence of mass or hydronephrosis.  Incidental upper pole 10 mm cyst noted.  Abdominal Aorta:  No aneurysm identified.  IMPRESSION: Prior cholecystectomy.  No acute finding by ultrasound.   Original Report Authenticated By: Jerilynn Mages. Annamaria Boots, M.D.   US Carotid Duplex Bilateral  06/12/2013   *RADIOLOGY REPORT*  Clinical Data: TIA symptoms  BILATERAL CAROTID DUPLEX ULTRASOUND  Technique: Pearline Cables scale imaging, color Doppler and duplex ultrasound were performed of bilateral carotid and vertebral arteries in the neck.  Comparison:  None.  Criteria:  Quantification of carotid stenosis is based on velocity parameters that correlate the residual internal carotid diameter with NASCET-based stenosis levels, using the diameter of the distal internal carotid lumen as the denominator for stenosis measurement.  The following velocity measurements were obtained:                   PEAK  SYSTOLIC/END DIASTOLIC RIGHT ICA:                        Occludedcm/sec CCA:                        40/9WJ/XBJ SYSTOLIC ICA/CCA RATIO:     Not applicable DIASTOLIC ICA/CCA RATIO:    Not applicable ECA:                        71cm/sec  LEFT ICA:                        Occludedcm/sec CCA:                        47/8GN/FAO SYSTOLIC ICA/CCA RATIO:     Not applicable DIASTOLIC ICA/CCA RATIO:    Not applicable ECA:                        61cm/sec  Findings:  RIGHT CAROTID ARTERY: The internal carotid artery is occluded just beyond its origin  RIGHT VERTEBRAL ARTERY:  Antegrade  LEFT CAROTID ARTERY: The internal carotid artery is occluded just beyond its origin.  LEFT VERTEBRAL ARTERY:  Antegrade  IMPRESSION: Bilateral internal carotid artery occlusion.   Original Report Authenticated By: Inez Catalina, M.D.   Dg Chest Port 1 View  06/23/2013   *RADIOLOGY REPORT*  Clinical Data: 79 year old female PICC line placement.  PORTABLE CHEST - 1 VIEW  Comparison: Chest CT 0133 hours the same day and earlier.  Findings: Portable AP upright view at 1608 hours.  Right PICC line in place, tip at the level of the cavoatrial junction.  Increased interstitial opacity compared to 06/21/2013.  No pneumothorax, pleural effusion or consolidation.  Stable cardiac size and mediastinal contours.  IMPRESSION: 1.  Right upper extremity PICC line tip at the cavoatrial junction level. 2.  Increased interstitial opacity since 06/21/2013 could reflect viral / atypical respiratory infection or vascular congestion.   Original Report Authenticated By: Roselyn Reef, M.D.   Dg Chest Port 1 View  06/21/2013   *RADIOLOGY REPORT*  Clinical Data: Fever and cough.  PORTABLE CHEST - 1 VIEW  Comparison: PA and lateral chest 06/15/2013.  Findings: The lungs are emphysematous  but clear.  Heart size is mildly enlarged.  No pneumothorax or pleural effusion.  IMPRESSION: Emphysema and cardiomegaly without acute disease.   Original Report Authenticated By: Orlean Patten, M.D.   Dg Chest Port 1 View  06/12/2013   *RADIOLOGY REPORT*  Clinical Data: Lower extremity weakness.  PORTABLE CHEST - 1 VIEW  Comparison: 05/30/2013.  Findings: Chronic cardiomegaly.  Aortic atherosclerosis.  No change in heart size and mediastinal contours.  Hyperinflated lungs.  Persistent asymmetric density in the medial right base ( this findings seen dating back to 2011).  There is streaky opacity in the retrocardiac lung.  Small pleural effusions may persist.  No pneumothorax.  No acute osseous findings.  IMPRESSION:  1.  Cardiomegaly and pulmonary venous congestion. 2.  Ongoing or recurrent right base infiltrate or atelectasis. 3.  COPD.   Original Report Authenticated By: Jorje Guild   Dg Chest Portable 1 View  05/30/2013   *RADIOLOGY REPORT*  Clinical Data: Short of breath  PORTABLE CHEST - 1 VIEW  Comparison: Prior chest x-ray 04/19/2013; acute abdominal series including frontal chest x-ray 05/08/2013  Findings: Continued improvement and patchy right lower lobe airspace disease over the past to chest x-rays.  There may be a small residual right pleural effusion.  Borderline enlarged heart stable compared to prior.  Trace atherosclerotic calcification of the transverse aorta.  Prominence of the interstitial markings and pulmonary vascular congestion are unchanged.  No acute osseous abnormality.  No pneumothorax.  IMPRESSION:  1.  No acute cardiopulmonary disease. 2.  Continued slight interval improvement and right lower lobe opacity.  There may be a small right pleural effusion.   Original Report Authenticated By: Jacqulynn Cadet, M.D.    Lab Results: Basic Metabolic Panel: No results found for this basename: NA, K, CL, CO2, GLUCOSE, BUN, CREATININE, CALCIUM, MG, PHOS,  in the last 72 hours Liver Function Tests:  Recent Labs  06/26/13 0834  AST 40*  ALT 36*  ALKPHOS 279*  BILITOT 0.4  PROT 6.8  ALBUMIN 3.1*     CBC: No results found for this basename: WBC,  NEUTROABS, HGB, HCT, MCV, PLT,  in the last 72 hours  Recent Results (from the past 240 hour(s))  CULTURE, BLOOD (ROUTINE X 2)     Status: None   Collection Time    06/21/13 10:25 PM      Result Value Range Status   Specimen Description BLOOD RIGHT HAND   Final   Special Requests BOTTLES DRAWN AEROBIC ONLY 1CC   Final   Culture  Setup Time     Final   Value: 06/22/2013 04:30     Performed at [facility]   Culture     Final   Value:        BLOOD CULTURE RECEIVED NO GROWTH TO DATE CULTURE WILL BE HELD FOR 5 DAYS BEFORE ISSUING A FINAL NEGATIVE REPORT     Performed at [facility]   Report Status PENDING   Incomplete  CULTURE, BLOOD (ROUTINE X 2)     Status: None   Collection Time    06/21/13 11:00 PM      Result Value Range Status   Specimen Description BLOOD HAND RIGHT   Final   Special Requests BOTTLES DRAWN AEROBIC ONLY 3CC   Final   Culture  Setup Time     Final   Value: 06/22/2013 04:30     Performed at [place]     Final  Value:        BLOOD CULTURE RECEIVED NO GROWTH TO DATE CULTURE WILL BE HELD FOR 5 DAYS BEFORE ISSUING A FINAL NEGATIVE REPORT     Performed at [facility]   Report Status PENDING   Incomplete  URINE CULTURE     Status: None   Collection Time    06/21/13 11:00 PM      Result Value Range Status   Specimen Description URINE, CATHETERIZED   Final   Special Requests NONE   Final   Culture  Setup Time     Final   Value: 06/22/2013 07:09     Performed at [facility] Count     Final   Value: >=100,000 COLONIES/ML     Performed at [facility]   Culture     Final   Value: Multiple bacterial morphotypes present, none predominant. Suggest appropriate recollection if clinically indicated.     Performed at [facility]   Report Status 06/23/2013 FINAL   Final  MRSA PCR SCREENING     Status: None   Collection Time    06/23/13  4:00 AM      Result Value Range Status   MRSA by PCR  NEGATIVE  NEGATIVE Final   Comment:            The GeneXpert MRSA Assay (FDA     approved for NASAL specimens     only), is one component of a     comprehensive MRSA colonization     surveillance program. It is not     intended to diagnose MRSA     infection nor to guide or     monitor treatment for     MRSA infections.  URINE CULTURE     Status: None   Collection Time    06/24/13  4:15 AM      Result Value Range Status   Specimen Description URINE, CLEAN CATCH   Final   Special Requests NONE   Final   Culture  Setup Time     Final   Value: 06/24/2013 18:57     Performed at [facility]     Final   Value: NO GROWTH     Performed at [facility]   Culture     Final   Value: NO GROWTH     Performed at [facility]   Report Status 06/25/2013 FINAL   Final     Hospital Course:  This is an 79 years old female admitted due to confusion, change in mental status and tachyarrhythmia. Patient was in and out hospital since she had appendectomy about 4 months ago. She was hydrated. Her medicatiions were adjusted. Patient was seen by GI, cardiology and neurology. She improved and discharged with home health.  Discharge Exam: Blood pressure 202/89, pulse 70, temperature 98.7 F (37.1 C), temperature source Oral, resp. rate 18, height _0  (1.6 m), weight 62.1 kg (136 lb 14.5 oz), SpO2 96.00%.    Disposition:  Home        Follow-up Information   Follow up with [facility].   Contact information:   [place] [address] [phone]      Follow up with [facility], MD In 1 week.   Contact information:   [place] [address] [phone]       Signed: Shekita Boyden  06/27/2013, 8:11 AM

## 2013-06-28 LAB — CULTURE, BLOOD (ROUTINE X 2)
Culture: NO GROWTH
Culture: NO GROWTH

## 2013-06-30 DIAGNOSIS — I10 Essential (primary) hypertension: Secondary | ICD-10-CM | POA: Diagnosis not present

## 2013-06-30 DIAGNOSIS — IMO0001 Reserved for inherently not codable concepts without codable children: Secondary | ICD-10-CM | POA: Diagnosis not present

## 2013-06-30 DIAGNOSIS — E860 Dehydration: Secondary | ICD-10-CM | POA: Diagnosis not present

## 2013-06-30 DIAGNOSIS — Z794 Long term (current) use of insulin: Secondary | ICD-10-CM | POA: Diagnosis not present

## 2013-06-30 DIAGNOSIS — R627 Adult failure to thrive: Secondary | ICD-10-CM | POA: Diagnosis not present

## 2013-07-03 DIAGNOSIS — R627 Adult failure to thrive: Secondary | ICD-10-CM | POA: Diagnosis not present

## 2013-07-03 DIAGNOSIS — Z794 Long term (current) use of insulin: Secondary | ICD-10-CM | POA: Diagnosis not present

## 2013-07-03 DIAGNOSIS — I10 Essential (primary) hypertension: Secondary | ICD-10-CM | POA: Diagnosis not present

## 2013-07-03 DIAGNOSIS — IMO0001 Reserved for inherently not codable concepts without codable children: Secondary | ICD-10-CM | POA: Diagnosis not present

## 2013-07-03 DIAGNOSIS — E860 Dehydration: Secondary | ICD-10-CM | POA: Diagnosis not present

## 2013-07-04 DIAGNOSIS — E860 Dehydration: Secondary | ICD-10-CM | POA: Diagnosis not present

## 2013-07-04 DIAGNOSIS — Z794 Long term (current) use of insulin: Secondary | ICD-10-CM | POA: Diagnosis not present

## 2013-07-04 DIAGNOSIS — IMO0001 Reserved for inherently not codable concepts without codable children: Secondary | ICD-10-CM | POA: Diagnosis not present

## 2013-07-04 DIAGNOSIS — I10 Essential (primary) hypertension: Secondary | ICD-10-CM | POA: Diagnosis not present

## 2013-07-04 DIAGNOSIS — R627 Adult failure to thrive: Secondary | ICD-10-CM | POA: Diagnosis not present

## 2013-07-05 DIAGNOSIS — E860 Dehydration: Secondary | ICD-10-CM | POA: Diagnosis not present

## 2013-07-05 DIAGNOSIS — IMO0001 Reserved for inherently not codable concepts without codable children: Secondary | ICD-10-CM | POA: Diagnosis not present

## 2013-07-05 DIAGNOSIS — R627 Adult failure to thrive: Secondary | ICD-10-CM | POA: Diagnosis not present

## 2013-07-05 DIAGNOSIS — Z794 Long term (current) use of insulin: Secondary | ICD-10-CM | POA: Diagnosis not present

## 2013-07-05 DIAGNOSIS — I10 Essential (primary) hypertension: Secondary | ICD-10-CM | POA: Diagnosis not present

## 2013-07-06 DIAGNOSIS — R634 Abnormal weight loss: Secondary | ICD-10-CM | POA: Diagnosis not present

## 2013-07-06 DIAGNOSIS — F339 Major depressive disorder, recurrent, unspecified: Secondary | ICD-10-CM | POA: Diagnosis not present

## 2013-07-06 DIAGNOSIS — I10 Essential (primary) hypertension: Secondary | ICD-10-CM | POA: Diagnosis not present

## 2013-07-06 DIAGNOSIS — E119 Type 2 diabetes mellitus without complications: Secondary | ICD-10-CM | POA: Diagnosis not present

## 2013-07-07 DIAGNOSIS — R627 Adult failure to thrive: Secondary | ICD-10-CM | POA: Diagnosis not present

## 2013-07-07 DIAGNOSIS — Z794 Long term (current) use of insulin: Secondary | ICD-10-CM | POA: Diagnosis not present

## 2013-07-07 DIAGNOSIS — I10 Essential (primary) hypertension: Secondary | ICD-10-CM | POA: Diagnosis not present

## 2013-07-07 DIAGNOSIS — IMO0001 Reserved for inherently not codable concepts without codable children: Secondary | ICD-10-CM | POA: Diagnosis not present

## 2013-07-07 DIAGNOSIS — E860 Dehydration: Secondary | ICD-10-CM | POA: Diagnosis not present

## 2013-07-10 DIAGNOSIS — E860 Dehydration: Secondary | ICD-10-CM | POA: Diagnosis not present

## 2013-07-10 DIAGNOSIS — R627 Adult failure to thrive: Secondary | ICD-10-CM | POA: Diagnosis not present

## 2013-07-10 DIAGNOSIS — I10 Essential (primary) hypertension: Secondary | ICD-10-CM | POA: Diagnosis not present

## 2013-07-10 DIAGNOSIS — Z794 Long term (current) use of insulin: Secondary | ICD-10-CM | POA: Diagnosis not present

## 2013-07-10 DIAGNOSIS — IMO0001 Reserved for inherently not codable concepts without codable children: Secondary | ICD-10-CM | POA: Diagnosis not present

## 2013-07-12 DIAGNOSIS — IMO0001 Reserved for inherently not codable concepts without codable children: Secondary | ICD-10-CM | POA: Diagnosis not present

## 2013-07-12 DIAGNOSIS — I10 Essential (primary) hypertension: Secondary | ICD-10-CM | POA: Diagnosis not present

## 2013-07-12 DIAGNOSIS — R627 Adult failure to thrive: Secondary | ICD-10-CM | POA: Diagnosis not present

## 2013-07-12 DIAGNOSIS — E860 Dehydration: Secondary | ICD-10-CM | POA: Diagnosis not present

## 2013-07-12 DIAGNOSIS — Z794 Long term (current) use of insulin: Secondary | ICD-10-CM | POA: Diagnosis not present

## 2013-07-18 DIAGNOSIS — I10 Essential (primary) hypertension: Secondary | ICD-10-CM | POA: Diagnosis not present

## 2013-07-18 DIAGNOSIS — E860 Dehydration: Secondary | ICD-10-CM | POA: Diagnosis not present

## 2013-07-18 DIAGNOSIS — Z794 Long term (current) use of insulin: Secondary | ICD-10-CM | POA: Diagnosis not present

## 2013-07-18 DIAGNOSIS — IMO0001 Reserved for inherently not codable concepts without codable children: Secondary | ICD-10-CM | POA: Diagnosis not present

## 2013-07-18 DIAGNOSIS — R627 Adult failure to thrive: Secondary | ICD-10-CM | POA: Diagnosis not present

## 2013-07-20 DIAGNOSIS — I10 Essential (primary) hypertension: Secondary | ICD-10-CM | POA: Diagnosis not present

## 2013-07-20 DIAGNOSIS — E860 Dehydration: Secondary | ICD-10-CM | POA: Diagnosis not present

## 2013-07-20 DIAGNOSIS — E119 Type 2 diabetes mellitus without complications: Secondary | ICD-10-CM | POA: Diagnosis not present

## 2013-07-20 DIAGNOSIS — IMO0001 Reserved for inherently not codable concepts without codable children: Secondary | ICD-10-CM | POA: Diagnosis not present

## 2013-07-20 DIAGNOSIS — Z794 Long term (current) use of insulin: Secondary | ICD-10-CM | POA: Diagnosis not present

## 2013-07-20 DIAGNOSIS — R627 Adult failure to thrive: Secondary | ICD-10-CM | POA: Diagnosis not present

## 2013-07-25 DIAGNOSIS — R627 Adult failure to thrive: Secondary | ICD-10-CM | POA: Diagnosis not present

## 2013-07-25 DIAGNOSIS — IMO0001 Reserved for inherently not codable concepts without codable children: Secondary | ICD-10-CM | POA: Diagnosis not present

## 2013-07-25 DIAGNOSIS — I10 Essential (primary) hypertension: Secondary | ICD-10-CM | POA: Diagnosis not present

## 2013-07-25 DIAGNOSIS — Z794 Long term (current) use of insulin: Secondary | ICD-10-CM | POA: Diagnosis not present

## 2013-07-25 DIAGNOSIS — E860 Dehydration: Secondary | ICD-10-CM | POA: Diagnosis not present

## 2013-07-28 DIAGNOSIS — IMO0001 Reserved for inherently not codable concepts without codable children: Secondary | ICD-10-CM | POA: Diagnosis not present

## 2013-07-28 DIAGNOSIS — R627 Adult failure to thrive: Secondary | ICD-10-CM | POA: Diagnosis not present

## 2013-07-28 DIAGNOSIS — Z794 Long term (current) use of insulin: Secondary | ICD-10-CM | POA: Diagnosis not present

## 2013-07-28 DIAGNOSIS — E860 Dehydration: Secondary | ICD-10-CM | POA: Diagnosis not present

## 2013-07-28 DIAGNOSIS — I10 Essential (primary) hypertension: Secondary | ICD-10-CM | POA: Diagnosis not present

## 2013-07-28 NOTE — Progress Notes (Signed)
Quick Note:  Mitochronidrial Ab positive. At this time, I would recommend repeat LFTs. OV with [facility] only. ______

## 2013-07-31 ENCOUNTER — Other Ambulatory Visit: Payer: Self-pay

## 2013-07-31 DIAGNOSIS — R7989 Other specified abnormal findings of blood chemistry: Secondary | ICD-10-CM

## 2013-07-31 NOTE — Progress Notes (Signed)
Quick Note:  Called and informed pt's daughter. Order has been faxed to [facility]. Routing back to [facility]. Per [facility] pt is [facility] pt and not [facility]. Please advise for the appt. ______

## 2013-08-01 ENCOUNTER — Encounter: Payer: Self-pay | Admitting: Gastroenterology

## 2013-08-01 DIAGNOSIS — R627 Adult failure to thrive: Secondary | ICD-10-CM | POA: Diagnosis not present

## 2013-08-01 DIAGNOSIS — IMO0001 Reserved for inherently not codable concepts without codable children: Secondary | ICD-10-CM | POA: Diagnosis not present

## 2013-08-01 DIAGNOSIS — Z794 Long term (current) use of insulin: Secondary | ICD-10-CM | POA: Diagnosis not present

## 2013-08-01 DIAGNOSIS — E860 Dehydration: Secondary | ICD-10-CM | POA: Diagnosis not present

## 2013-08-01 DIAGNOSIS — I10 Essential (primary) hypertension: Secondary | ICD-10-CM | POA: Diagnosis not present

## 2013-08-03 DIAGNOSIS — R7989 Other specified abnormal findings of blood chemistry: Secondary | ICD-10-CM | POA: Diagnosis not present

## 2013-08-04 LAB — HEPATIC FUNCTION PANEL
ALT: 23 U/L (ref 0–35)
AST: 15 U/L (ref 0–37)
Albumin: 3.6 g/dL (ref 3.5–5.2)
Alkaline Phosphatase: 57 U/L (ref 39–117)
Bilirubin, Direct: 0.1 mg/dL (ref 0.0–0.3)
Indirect Bilirubin: 0.3 mg/dL (ref 0.0–0.9)
Total Bilirubin: 0.4 mg/dL (ref 0.3–1.2)
Total Protein: 6.8 g/dL (ref 6.0–8.3)

## 2013-08-07 DIAGNOSIS — Z23 Encounter for immunization: Secondary | ICD-10-CM | POA: Diagnosis not present

## 2013-08-07 DIAGNOSIS — I10 Essential (primary) hypertension: Secondary | ICD-10-CM | POA: Diagnosis not present

## 2013-08-07 DIAGNOSIS — F0280 Dementia in other diseases classified elsewhere without behavioral disturbance: Secondary | ICD-10-CM | POA: Diagnosis not present

## 2013-08-07 DIAGNOSIS — R634 Abnormal weight loss: Secondary | ICD-10-CM | POA: Diagnosis not present

## 2013-08-07 DIAGNOSIS — E119 Type 2 diabetes mellitus without complications: Secondary | ICD-10-CM | POA: Diagnosis not present

## 2013-08-15 NOTE — Progress Notes (Signed)
Quick Note:  Called and informed pt's daughter, caretaker, Lashayla Armes. ______

## 2013-08-15 NOTE — Progress Notes (Signed)
Quick Note:  H/O AMA +, during work-up in 06/2013 for abnormal LFTs. Now LFTs are normal. Keep OV with [facility] to discuss. ______

## 2013-08-17 ENCOUNTER — Encounter: Payer: Self-pay | Admitting: Gastroenterology

## 2013-08-17 ENCOUNTER — Ambulatory Visit (INDEPENDENT_AMBULATORY_CARE_PROVIDER_SITE_OTHER): Payer: Medicare Other | Admitting: Gastroenterology

## 2013-08-17 VITALS — BP 136/76 | HR 88 | Temp 97.6°F | Ht 64.0 in | Wt 146.4 lb

## 2013-08-17 DIAGNOSIS — R7401 Elevation of levels of liver transaminase levels: Secondary | ICD-10-CM

## 2013-08-17 DIAGNOSIS — IMO0002 Reserved for concepts with insufficient information to code with codable children: Secondary | ICD-10-CM

## 2013-08-17 MED ORDER — MIRTAZAPINE 7.5 MG PO TABS: ORAL_TABLET | ORAL | Status: DC

## 2013-08-17 NOTE — Progress Notes (Signed)
cc'd to pcp

## 2013-08-17 NOTE — Assessment & Plan Note (Signed)
AMA POSITIVE, BUT NOT CLINICALLY SIGNIFICANT IN SETTING OF NL LIVER ENZYMES.  OPV IN 6 MOS. REPEAT HFP Q6-12 MOS.

## 2013-08-17 NOTE — Patient Instructions (Signed)
STOP TAKING REMERON SOLU-TABS. TAKE REMERON TABLET 2 AT BEDTIME FOR 7 DAYS THEN 1 AT BEDTIME FOR 7 DAYS.  STOP TAKING NYSTATIN & MEGACE.  Belk. STOP COLACE IF YOU HAVE LOOSE STOOLS.  FOLLOW UP IN 6 MOS. MERRY CHRISTMAS AND HAPPY NEW YEAR!

## 2013-08-17 NOTE — Assessment & Plan Note (Signed)
RESOLVED. PT TOLERATING POS. FEELS LIKE REMERON IS MAKING HER LEGS HEAVY.  STOP MEGACE & TAPER OFF REMERON. CALL IN 2-3 WEEKS IF DEPRESSION STILL AN ISSUE. CONSIDER SSRI. STOP NYSTATIN. OPV IN 6 MOS

## 2013-08-17 NOTE — Progress Notes (Signed)
Subjective:    Patient ID: [name], female    DOB: [birth year], 79 y.o.   MRN: [id number]  Michele Fire, MD  HPI PT GAINING WEIGHT. EATING BETTER. MAY BREATHE HEAVY. STOMACH HAS GOTTEN BIGGER. PT CONCERNED ABOUT HER LEGS. BEFORE ALL THIS STARTED SHE WAS DRIVING BUT NOW FEELS LIKE LEGS ARE WEEK. BEFORE MAY 2014: MUCH MORE FUNCTIONAL. HASN'T FULLY RECOVERED SINCE THE APPENDECTOMY. LEGS FEEL HEAVY. HAD OT/PT EVAL. NOW ON HOME PROGRAM. BMs: EVERY DAY. PT DENIES FEVER, CHILLS, BRBPR, nausea, vomiting, melena, diarrhea, constipation, abd pain, problems swallowing, OR heartburn or indigestion.   Past Medical History  Diagnosis Date  . Hemorrhage of gastrointestinal tract, unspecified   . Other malaise and fatigue   . Tobacco use disorder   . Parotid tumor   . Neoplasm of malignant     breast  ,HX   . Diabetes mellitus   . Osteopenia   . Hypertension   . Hyperlipidemia   . Depression   . Anxiety state, unspecified   . Chronic headaches    Past Surgical History  Procedure Laterality Date  . Mastectomy  1994    left breast -related to breast cancer  . Vesicovaginal fistula closure w/ tah  1979  . Cholecystectomy  2008  . Hemorrhoid surgery  1960s  . Laparoscopic appendectomy N/A 04/13/2013    Procedure: APPENDECTOMY LAPAROSCOPIC;  Surgeon: Donato Heinz, MD;  Location: [facility];  Service: General;  Laterality: N/A;  . Colonoscopy  2010    Dr. Oneida Alar: single tubular adenoma, one hyperplastic polyp. Next TCS 2020 health permitting.   Allergies  Allergen Reactions  . Penicillins Rash    Current Outpatient Prescriptions  Medication Sig Dispense Refill  . acetaminophen (TYLENOL) 500 MG tablet Take 500 mg by mouth every 6 (six) hours as needed for pain.      Marland Kitchen ALPRAZolam (NIRAVAM) 0.25 MG dissolvable tablet Take 0.25 mg by mouth at bedtime as needed.        Marland Kitchen aspirin 325 MG tablet Take 325 mg by mouth daily.        . Calcium Carbonate-Vit D-Min 600-400 MG-UNIT TABS Take 1 tablet  by mouth every morning.      . cholecalciferol (VITAMIN D) 1000 UNITS tablet Take 1,000 Units by mouth every morning.       . Coenzyme Q10 (CO Q 10) 100 MG CAPS Take 100 mg by mouth every morning.       . docusate sodium (COLACE) 100 MG capsule Take 100 mg by mouth 2 (two) times daily.      Marland Kitchen esomeprazole (NEXIUM) 40 MG capsule Take 40 mg by mouth at bedtime.       . fish oil-omega-3 fatty acids 1000 MG capsule Take 2 g by mouth every morning.       . glyBURIDE-metformin (GLUCOVANCE) 5-500 MG per tablet Take 2 tablets by mouth 2 (two) times daily with a meal.       . insulin glargine (LANTUS) 100 UNIT/ML injection Inject 2-8 Units into the skin at bedtime. Give if BS 140-150= 3 units, 151-160=4 units, 161-170=5 units, 171-180= 6 units, >181=7units      . lisinopril (PRINIVIL,ZESTRIL) 20 MG tablet Take 1 tablet (20 mg total) by mouth daily.    . megestrol (MEGACE) 400 MG/10ML suspension Take 10 mLs (400 mg total) by mouth 2 (two) times daily.    . metoprolol succinate (TOPROL-XL) 50 MG 24 hr tablet Take 1 tablet (50 mg total) by mouth daily. Take with or  immediately following a meal.    . mirtazapine (REMERON SOL-TAB) 30 MG disintegrating tablet Take 1 tablet (30 mg total) by mouth at bedtime.    . polyethylene glycol (MIRALAX / GLYCOLAX) packet Take 17 g by mouth daily as needed (Constipation).    Marland Kitchen spironolactone (ALDACTONE) 12.5 mg TABS Take 0.5 tablets (12.5 mg total) by mouth daily.    Marland Kitchen tiotropium (SPIRIVA) 18 MCG inhalation capsule Place 18 mcg into inhaler and inhale daily as needed (shortness of breath).          Review of Systems     Objective:   Physical Exam  Vitals reviewed. Constitutional: She is oriented to person, place, and time. She appears well-nourished. No distress.  HENT:  Head: Normocephalic and atraumatic.  Mouth/Throat: Oropharynx is clear and moist. No oropharyngeal exudate.  Eyes: Pupils are equal, round, and reactive to light. No scleral icterus.  Neck: Normal  range of motion. Neck supple.  Cardiovascular: Normal rate, regular rhythm and normal heart sounds.   Pulmonary/Chest: Effort normal and breath sounds normal. No respiratory distress.  Abdominal: Soft. Bowel sounds are normal. She exhibits no distension. There is no tenderness.  Abdominal bruit heard in the epigastrium  Musculoskeletal: She exhibits edema (TRACE TO 1+ BIL LE).  WALKS ASSISTED WITH A WALKER  Lymphadenopathy:    She has no cervical adenopathy.  Neurological: She is alert and oriented to person, place, and time.  NO  NEW FOCAL DEFICITS   Psychiatric:  FLAT AFFECT, NL MOOD          Assessment & Plan:

## 2013-08-18 NOTE — Progress Notes (Signed)
Reminder in epic °

## 2013-08-23 DIAGNOSIS — I10 Essential (primary) hypertension: Secondary | ICD-10-CM | POA: Diagnosis not present

## 2013-08-23 DIAGNOSIS — E860 Dehydration: Secondary | ICD-10-CM | POA: Diagnosis not present

## 2013-08-23 DIAGNOSIS — IMO0001 Reserved for inherently not codable concepts without codable children: Secondary | ICD-10-CM | POA: Diagnosis not present

## 2013-08-23 DIAGNOSIS — Z794 Long term (current) use of insulin: Secondary | ICD-10-CM | POA: Diagnosis not present

## 2013-08-23 DIAGNOSIS — R627 Adult failure to thrive: Secondary | ICD-10-CM | POA: Diagnosis not present

## 2013-08-24 ENCOUNTER — Telehealth: Payer: Self-pay | Admitting: *Deleted

## 2013-08-24 NOTE — Telephone Encounter (Signed)
Pt's mother called stating she thinks we need to increase her remeron 30 mg. Pt is on 15 right now and pt's mother states she needs to go back to 30 mg because her syptoms is coming back. The headache pain is also back. Please advise [phone]

## 2013-08-24 NOTE — Telephone Encounter (Signed)
Pt's daughter is aware to increase the medication

## 2013-08-24 NOTE — Telephone Encounter (Signed)
PLEASE CALL PT'S FAMILY. SHE MAY GO BACK ON REMERON 30 MG QHS.

## 2013-08-28 ENCOUNTER — Telehealth: Payer: Self-pay

## 2013-08-28 NOTE — Telephone Encounter (Signed)
PLEASE CALL PT'S DAUGHTER. SHE SHOULD MAY START MEGACE IN 2 WEEKS IF HER APPETITE DOES NOT IMPROVE WITH THE INCREASE IN THE REMERON DOSE.

## 2013-08-28 NOTE — Telephone Encounter (Signed)
VM from [name], pt's daughter. She said pt's appetite has decreased since she stopped the Megace. They would like to know if it is ok for her mom to resume the Megace. Please advise!

## 2013-08-28 NOTE — Telephone Encounter (Signed)
Called and informed [name], pt's daughter.

## 2013-08-29 DIAGNOSIS — E1149 Type 2 diabetes mellitus with other diabetic neurological complication: Secondary | ICD-10-CM | POA: Diagnosis not present

## 2013-09-05 DIAGNOSIS — I10 Essential (primary) hypertension: Secondary | ICD-10-CM | POA: Diagnosis not present

## 2013-09-05 DIAGNOSIS — E7800 Pure hypercholesterolemia, unspecified: Secondary | ICD-10-CM | POA: Diagnosis not present

## 2013-09-05 DIAGNOSIS — E119 Type 2 diabetes mellitus without complications: Secondary | ICD-10-CM | POA: Diagnosis not present

## 2013-09-05 DIAGNOSIS — R5381 Other malaise: Secondary | ICD-10-CM | POA: Diagnosis not present

## 2013-09-28 DIAGNOSIS — E7800 Pure hypercholesterolemia, unspecified: Secondary | ICD-10-CM | POA: Diagnosis not present

## 2013-09-28 DIAGNOSIS — I10 Essential (primary) hypertension: Secondary | ICD-10-CM | POA: Diagnosis not present

## 2013-09-28 DIAGNOSIS — E119 Type 2 diabetes mellitus without complications: Secondary | ICD-10-CM | POA: Diagnosis not present

## 2013-11-13 DIAGNOSIS — E119 Type 2 diabetes mellitus without complications: Secondary | ICD-10-CM | POA: Diagnosis not present

## 2013-11-13 DIAGNOSIS — I6529 Occlusion and stenosis of unspecified carotid artery: Secondary | ICD-10-CM | POA: Diagnosis not present

## 2013-11-13 DIAGNOSIS — D490 Neoplasm of unspecified behavior of digestive system: Secondary | ICD-10-CM | POA: Diagnosis not present

## 2013-11-13 DIAGNOSIS — R7401 Elevation of levels of liver transaminase levels: Secondary | ICD-10-CM | POA: Diagnosis not present

## 2013-11-13 DIAGNOSIS — R5381 Other malaise: Secondary | ICD-10-CM | POA: Diagnosis not present

## 2013-11-13 DIAGNOSIS — F0390 Unspecified dementia without behavioral disturbance: Secondary | ICD-10-CM | POA: Diagnosis not present

## 2013-11-14 DIAGNOSIS — E1149 Type 2 diabetes mellitus with other diabetic neurological complication: Secondary | ICD-10-CM | POA: Diagnosis not present

## 2013-12-01 ENCOUNTER — Encounter (HOSPITAL_COMMUNITY): Payer: Self-pay | Admitting: Emergency Medicine

## 2013-12-01 ENCOUNTER — Emergency Department (HOSPITAL_COMMUNITY): Payer: Medicare Other

## 2013-12-01 ENCOUNTER — Emergency Department (HOSPITAL_COMMUNITY)
Admission: EM | Admit: 2013-12-01 | Discharge: 2013-12-01 | Disposition: A | Discharge status: Home / Self Care | Payer: Medicare Other | Attending: Emergency Medicine | Admitting: Emergency Medicine

## 2013-12-01 DIAGNOSIS — Z880 Allergy status to penicillin: Secondary | ICD-10-CM | POA: Diagnosis not present

## 2013-12-01 DIAGNOSIS — Z8719 Personal history of other diseases of the digestive system: Secondary | ICD-10-CM | POA: Diagnosis not present

## 2013-12-01 DIAGNOSIS — R000 Tachycardia, unspecified: Secondary | ICD-10-CM | POA: Diagnosis not present

## 2013-12-01 DIAGNOSIS — I10 Essential (primary) hypertension: Secondary | ICD-10-CM | POA: Diagnosis not present

## 2013-12-01 DIAGNOSIS — Z7982 Long term (current) use of aspirin: Secondary | ICD-10-CM | POA: Diagnosis not present

## 2013-12-01 DIAGNOSIS — Z79899 Other long term (current) drug therapy: Secondary | ICD-10-CM | POA: Diagnosis not present

## 2013-12-01 DIAGNOSIS — F329 Major depressive disorder, single episode, unspecified: Secondary | ICD-10-CM | POA: Insufficient documentation

## 2013-12-01 DIAGNOSIS — F411 Generalized anxiety disorder: Secondary | ICD-10-CM | POA: Insufficient documentation

## 2013-12-01 DIAGNOSIS — J159 Unspecified bacterial pneumonia: Secondary | ICD-10-CM | POA: Insufficient documentation

## 2013-12-01 DIAGNOSIS — Z87891 Personal history of nicotine dependence: Secondary | ICD-10-CM | POA: Diagnosis not present

## 2013-12-01 DIAGNOSIS — Z853 Personal history of malignant neoplasm of breast: Secondary | ICD-10-CM | POA: Diagnosis not present

## 2013-12-01 DIAGNOSIS — R197 Diarrhea, unspecified: Secondary | ICD-10-CM | POA: Insufficient documentation

## 2013-12-01 DIAGNOSIS — R1110 Vomiting, unspecified: Secondary | ICD-10-CM | POA: Diagnosis not present

## 2013-12-01 DIAGNOSIS — G8929 Other chronic pain: Secondary | ICD-10-CM | POA: Insufficient documentation

## 2013-12-01 DIAGNOSIS — E119 Type 2 diabetes mellitus without complications: Secondary | ICD-10-CM | POA: Insufficient documentation

## 2013-12-01 DIAGNOSIS — R51 Headache: Secondary | ICD-10-CM | POA: Insufficient documentation

## 2013-12-01 DIAGNOSIS — Z8739 Personal history of other diseases of the musculoskeletal system and connective tissue: Secondary | ICD-10-CM | POA: Insufficient documentation

## 2013-12-01 DIAGNOSIS — J438 Other emphysema: Secondary | ICD-10-CM | POA: Diagnosis not present

## 2013-12-01 DIAGNOSIS — Z794 Long term (current) use of insulin: Secondary | ICD-10-CM | POA: Insufficient documentation

## 2013-12-01 DIAGNOSIS — F3289 Other specified depressive episodes: Secondary | ICD-10-CM | POA: Insufficient documentation

## 2013-12-01 DIAGNOSIS — E860 Dehydration: Secondary | ICD-10-CM

## 2013-12-01 DIAGNOSIS — E785 Hyperlipidemia, unspecified: Secondary | ICD-10-CM | POA: Diagnosis not present

## 2013-12-01 DIAGNOSIS — M79609 Pain in unspecified limb: Secondary | ICD-10-CM | POA: Diagnosis not present

## 2013-12-01 DIAGNOSIS — R112 Nausea with vomiting, unspecified: Secondary | ICD-10-CM | POA: Diagnosis not present

## 2013-12-01 DIAGNOSIS — J189 Pneumonia, unspecified organism: Secondary | ICD-10-CM | POA: Diagnosis not present

## 2013-12-01 DIAGNOSIS — I498 Other specified cardiac arrhythmias: Secondary | ICD-10-CM | POA: Diagnosis not present

## 2013-12-01 DIAGNOSIS — Z792 Long term (current) use of antibiotics: Secondary | ICD-10-CM | POA: Insufficient documentation

## 2013-12-01 LAB — URINALYSIS, ROUTINE W REFLEX MICROSCOPIC
Bilirubin Urine: NEGATIVE
Glucose, UA: >1000 mg/dL — AB
Ketones, ur: 15 mg/dL — AB
Nitrite: NEGATIVE
Protein, ur: 30 mg/dL — AB
Specific Gravity, Urine: 1.015 (ref 1.005–1.030)
Urobilinogen, UA: 0.2 mg/dL (ref 0.0–1.0)
pH: 6 (ref 5.0–8.0)

## 2013-12-01 LAB — COMPREHENSIVE METABOLIC PANEL
ALT: 20 U/L (ref 0–35)
AST: 22 U/L (ref 0–37)
Albumin: 3.9 g/dL (ref 3.5–5.2)
Alkaline Phosphatase: 102 U/L (ref 39–117)
BUN: 15 mg/dL (ref 6–23)
CO2: 18 mEq/L — ABNORMAL LOW (ref 19–32)
Calcium: 10.5 mg/dL (ref 8.4–10.5)
Chloride: 98 mEq/L (ref 96–112)
Creatinine, Ser: 0.57 mg/dL (ref 0.50–1.10)
GFR calc Af Amer: >90 mL/min (ref >90)
GFR calc non Af Amer: 85 mL/min — ABNORMAL LOW (ref >90)
Glucose, Bld: 295 mg/dL — ABNORMAL HIGH (ref 70–99)
Potassium: 3.4 mEq/L — ABNORMAL LOW (ref 3.7–5.3)
Sodium: 138 mEq/L (ref 137–147)
Total Bilirubin: 0.4 mg/dL (ref 0.3–1.2)
Total Protein: 8 g/dL (ref 6.0–8.3)

## 2013-12-01 LAB — TROPONIN I: Troponin I: <0.3 ng/mL (ref <0.30)

## 2013-12-01 LAB — CBC WITH DIFFERENTIAL/PLATELET
Basophils Absolute: 0.1 10*3/uL (ref 0.0–0.1)
Basophils Relative: 1 % (ref 0–1)
Eosinophils Absolute: 0.1 10*3/uL (ref 0.0–0.7)
Eosinophils Relative: 1 % (ref 0–5)
HCT: 38.5 % (ref 36.0–46.0)
Hemoglobin: 13.7 g/dL (ref 12.0–15.0)
Lymphocytes Relative: 18 % (ref 12–46)
Lymphs Abs: 1.3 10*3/uL (ref 0.7–4.0)
MCH: 35.6 pg — ABNORMAL HIGH (ref 26.0–34.0)
MCHC: 35.6 g/dL (ref 30.0–36.0)
MCV: 100 fL (ref 78.0–100.0)
Monocytes Absolute: 0.5 10*3/uL (ref 0.1–1.0)
Monocytes Relative: 6 % (ref 3–12)
Neutro Abs: 5.6 10*3/uL (ref 1.7–7.7)
Neutrophils Relative %: 75 % (ref 43–77)
Platelets: 193 10*3/uL (ref 150–400)
RBC: 3.85 MIL/uL — ABNORMAL LOW (ref 3.87–5.11)
RDW: 13.4 % (ref 11.5–15.5)
WBC: 7.5 10*3/uL (ref 4.0–10.5)

## 2013-12-01 LAB — BASIC METABOLIC PANEL
BUN: 12 mg/dL (ref 6–23)
CO2: 22 mEq/L (ref 19–32)
Calcium: 9.7 mg/dL (ref 8.4–10.5)
Chloride: 101 mEq/L (ref 96–112)
Creatinine, Ser: 0.5 mg/dL (ref 0.50–1.10)
GFR calc Af Amer: >90 mL/min (ref >90)
GFR calc non Af Amer: 89 mL/min — ABNORMAL LOW (ref >90)
Glucose, Bld: 201 mg/dL — ABNORMAL HIGH (ref 70–99)
Potassium: 3.6 mEq/L — ABNORMAL LOW (ref 3.7–5.3)
Sodium: 140 mEq/L (ref 137–147)

## 2013-12-01 LAB — URINE MICROSCOPIC-ADD ON

## 2013-12-01 LAB — LIPASE, BLOOD: Lipase: 26 U/L (ref 11–59)

## 2013-12-01 MED ORDER — ONDANSETRON HCL 4 MG PO TABS: 4.0000 mg | ORAL_TABLET | Freq: Four times a day (QID) | ORAL | Status: DC

## 2013-12-01 MED ORDER — SODIUM CHLORIDE 0.9 % IV BOLUS (SEPSIS)
1000.0000 mL | Freq: Once | INTRAVENOUS | Status: AC
  Administered 2013-12-01: 1000 mL via INTRAVENOUS

## 2013-12-01 MED ORDER — AZITHROMYCIN 250 MG PO TABS: 250.0000 mg | ORAL_TABLET | Freq: Every day | ORAL | Status: DC

## 2013-12-01 MED ORDER — LEVOFLOXACIN 500 MG PO TABS: 500.0000 mg | ORAL_TABLET | Freq: Every day | ORAL | Status: DC

## 2013-12-01 NOTE — Discharge Instructions (Signed)
Dehydration, Adult Dehydration is when you lose more fluids from the body than you take in. Vital organs like the kidneys, brain, and heart cannot function without a proper amount of fluids and salt. Any loss of fluids from the body can cause dehydration.  CAUSES   Vomiting.  Diarrhea.  Excessive sweating.  Excessive urine output.  Fever. SYMPTOMS  Mild dehydration  Thirst.  Dry lips.  Slightly dry mouth. Moderate dehydration  Very dry mouth.  Sunken eyes.  Skin does not bounce back quickly when lightly pinched and released.  Dark urine and decreased urine production.  Decreased tear production.  Headache. Severe dehydration  Very dry mouth.  Extreme thirst.  Rapid, weak pulse (more than 100 beats per minute at rest).  Cold hands and feet.  Not able to sweat in spite of heat and temperature.  Rapid breathing.  Blue lips.  Confusion and lethargy.  Difficulty being awakened.  Minimal urine production.  No tears. DIAGNOSIS  Your caregiver will diagnose dehydration based on your symptoms and your exam. Blood and urine tests will help confirm the diagnosis. The diagnostic evaluation should also identify the cause of dehydration. TREATMENT  Treatment of mild or moderate dehydration can often be done at home by increasing the amount of fluids that you drink. It is best to drink small amounts of fluid more often. Drinking too much at one time can make vomiting worse. Refer to the home care instructions below. Severe dehydration needs to be treated at the hospital where you will probably be given intravenous (IV) fluids that contain water and electrolytes. HOME CARE INSTRUCTIONS   Ask your caregiver about specific rehydration instructions.  Drink enough fluids to keep your urine clear or pale yellow.  Drink small amounts frequently if you have nausea and vomiting.  Eat as you normally do.  Avoid:  Foods or drinks high in sugar.  Carbonated  drinks.  Juice.  Extremely hot or cold fluids.  Drinks with caffeine.  Fatty, greasy foods.  Alcohol.  Tobacco.  Overeating.  Gelatin desserts.  Wash your hands well to avoid spreading bacteria and viruses.  Only take over-the-counter or prescription medicines for pain, discomfort, or fever as directed by your caregiver.  Ask your caregiver if you should continue all prescribed and over-the-counter medicines.  Keep all follow-up appointments with your caregiver. SEEK MEDICAL CARE IF:  You have abdominal pain and it increases or stays in one area (localizes).  You have a rash, stiff neck, or severe headache.  You are irritable, sleepy, or difficult to awaken.  You are weak, dizzy, or extremely thirsty. SEEK IMMEDIATE MEDICAL CARE IF:   You are unable to keep fluids down or you get worse despite treatment.  You have frequent episodes of vomiting or diarrhea.  You have blood or green matter (bile) in your vomit.  You have blood in your stool or your stool looks black and tarry.  You have not urinated in 6 to 8 hours, or you have only urinated a small amount of very dark urine.  You have a fever.  You faint. MAKE SURE YOU:   Understand these instructions.  Will watch your condition.  Will get help right away if you are not doing well or get worse. Document Released: 11/02/2005 Document Revised: 01/25/2012 Document Reviewed: 06/22/2011 [facility] Patient Information 2014 [place], [place]. Pneumonia, Adult Pneumonia is an infection of the lungs.  CAUSES Pneumonia may be caused by bacteria or a virus. Usually, these infections are caused by breathing infectious  particles into the lungs (respiratory tract). SYMPTOMS   Cough.  Fever.  Chest pain.  Increased rate of breathing.  Wheezing.  Mucus production. DIAGNOSIS  If you have the common symptoms of pneumonia, your caregiver will typically confirm the diagnosis with a chest X-ray. The X-ray will show  an abnormality in the lung (pulmonary infiltrate) if you have pneumonia. Other tests of your blood, urine, or sputum may be done to find the specific cause of your pneumonia. Your caregiver may also do tests (blood gases or pulse oximetry) to see how well your lungs are working. TREATMENT  Some forms of pneumonia may be spread to other people when you cough or sneeze. You may be asked to wear a mask before and during your exam. Pneumonia that is caused by bacteria is treated with antibiotic medicine. Pneumonia that is caused by the influenza virus may be treated with an antiviral medicine. Most other viral infections must run their course. These infections will not respond to antibiotics.  PREVENTION A pneumococcal shot (vaccine) is available to prevent a common bacterial cause of pneumonia. This is usually suggested for:  People over 39 years old.  Patients on chemotherapy.  People with chronic lung problems, such as bronchitis or emphysema.  People with immune system problems. If you are over 65 or have a high risk condition, you may receive the pneumococcal vaccine if you have not received it before. In some countries, a routine influenza vaccine is also recommended. This vaccine can help prevent some cases of pneumonia.You may be offered the influenza vaccine as part of your care. If you smoke, it is time to quit. You may receive instructions on how to stop smoking. Your caregiver can provide medicines and counseling to help you quit. HOME CARE INSTRUCTIONS   Cough suppressants may be used if you are losing too much rest. However, coughing protects you by clearing your lungs. You should avoid using cough suppressants if you can.  Your caregiver may have prescribed medicine if he or she thinks your pneumonia is caused by a bacteria or influenza. Finish your medicine even if you start to feel better.  Your caregiver may also prescribe an expectorant. This loosens the mucus to be coughed  up.  Only take over-the-counter or prescription medicines for pain, discomfort, or fever as directed by your caregiver.  Do not smoke. Smoking is a common cause of bronchitis and can contribute to pneumonia. If you are a smoker and continue to smoke, your cough may last several weeks after your pneumonia has cleared.  A cold steam vaporizer or humidifier in your room or home may help loosen mucus.  Coughing is often worse at night. Sleeping in a semi-upright position in a recliner or using a couple pillows under your head will help with this.  Get rest as you feel it is needed. Your body will usually let you know when you need to rest. SEEK IMMEDIATE MEDICAL CARE IF:   Your illness becomes worse. This is especially true if you are elderly or weakened from any other disease.  You cannot control your cough with suppressants and are losing sleep.  You begin coughing up blood.  You develop pain which is getting worse or is uncontrolled with medicines.  You have a fever.  Any of the symptoms which initially brought you in for treatment are getting worse rather than better.  You develop shortness of breath or chest pain. MAKE SURE YOU:   Understand these instructions.  Will watch your  condition.  Will get help right away if you are not doing well or get worse. Document Released: 11/02/2005 Document Revised: 01/25/2012 Document Reviewed: 01/22/2011 [facility] Patient Information 2014 [place], [place].

## 2013-12-01 NOTE — ED Notes (Signed)
Pt states she has been nauseated this morning, has cardiac HX and HX of cancer, home health nurse wanted the pt evaluated. Pt's HR elevated at triage.

## 2013-12-01 NOTE — ED Notes (Signed)
HR 98, EDP ordered 2nd EKG

## 2013-12-01 NOTE — ED Notes (Signed)
[facility] called out for fall, pt denies fall, states she had lt arm pain and headache, pain has subsided on arrival to ED. Pt denies pain.

## 2013-12-01 NOTE — ED Notes (Signed)
Pt alert & oriented x4, stable gait. Patient given discharge instructions, paperwork & prescription(s). Patient instructed to stop at the registration desk to finish any additional paperwork. Patient verbalized understanding. Pt left department w/ no further questions.

## 2013-12-01 NOTE — ED Provider Notes (Signed)
CSN: [id number]     Arrival date & time 12/01/13  8676 History  This chart was scribed for Michele Hacker, MD by Zettie Pho, ED Scribe. This patient was seen in room APA06/APA06 and the patient's care was started at 7:38 AM.    Chief Complaint  Patient presents with  . Extremity Pain    lt arm   The history is provided by the patient and a relative (daughter). No language interpreter was used.   HPI Comments: [name] is a 80 y.o. Female with a history of breast cancer, htn, hld, osteopenia and chronic headaches brought in by EMS who presents to the Emergency Department complaining of a pain to the left arm with associated multiple episodes of emesis and diarrhea, and headache onset this morning. She reports associated nausea onset yesterday. She states that the pain to her left arm has since resolved and is normal for her secondary to her history of mastectomy and denies any recent changes. EMS was called to respond to a fall, but patient denies falling. Her daughter reports that the patient appeared disoriented this morning and like she had limited use of her arm, but patient states this was only due to the pain. She denies fever, chest pain, new shortness of breath, abdominal pain. Patient also has a history of DM, HTN, and hyperlipidemia.   Past Medical History  Diagnosis Date  . Hemorrhage of gastrointestinal tract, unspecified   . Other malaise and fatigue   . Tobacco use disorder   . Parotid tumor   . Neoplasm of malignant     breast  ,HX   . Diabetes mellitus   . Osteopenia   . Hypertension   . Hyperlipidemia   . Depression   . Anxiety state, unspecified   . Chronic headaches    Past Surgical History  Procedure Laterality Date  . Mastectomy  1994    left breast -related to breast cancer  . Vesicovaginal fistula closure w/ tah  1979  . Cholecystectomy  2008  . Hemorrhoid surgery  1960s  . Laparoscopic appendectomy N/A 04/13/2013    Procedure: APPENDECTOMY  LAPAROSCOPIC;  Surgeon: Donato Heinz, MD;  Location: [facility];  Service: General;  Laterality: N/A;  . Colonoscopy  2010    Dr. Oneida Alar: single tubular adenoma, one hyperplastic polyp. Next TCS 2020 health permitting.   Family History  Problem Relation Age of Onset  . Heart failure Mother     CHF  . Cirrhosis Brother   . Lung cancer Father     brain mets  . Kidney cancer Mother   . Kidney cancer Sister    History  Substance Use Topics  . Smoking status: Former Smoker -- 0.50 packs/day for 40 years    Types: Cigarettes    Quit date: 03/30/2013  . Smokeless tobacco: Not on file  . Alcohol Use: No   OB History   Grav Para Term Preterm Abortions TAB SAB Ect Mult Living                 Review of Systems  Constitutional: Negative for fever.  Respiratory: Negative for cough, chest tightness and shortness of breath.   Cardiovascular: Negative for chest pain.  Gastrointestinal: Positive for nausea, vomiting and diarrhea. Negative for abdominal pain.  Genitourinary: Negative for dysuria.  Musculoskeletal: Positive for myalgias (left arm, resolved). Negative for back pain. Arthralgias: left arm, resolved.       Arm pain  Skin: Negative for wound.  Neurological:  Positive for headaches.  Psychiatric/Behavioral: Negative for confusion.  All other systems reviewed and are negative.    Allergies  Penicillins  Home Medications   Current Outpatient Rx  Name  Route  Sig  Dispense  Refill  . acetaminophen (TYLENOL) 500 MG tablet   Oral   Take 500 mg by mouth every 6 (six) hours as needed for pain.         Marland Kitchen ALPRAZolam (NIRAVAM) 0.25 MG dissolvable tablet   Oral   Take 0.25 mg by mouth at bedtime as needed.           Marland Kitchen aspirin 325 MG tablet   Oral   Take 325 mg by mouth daily.           . Calcium Carbonate-Vit D-Min 600-400 MG-UNIT TABS   Oral   Take 1 tablet by mouth every morning.         . cholecalciferol (VITAMIN D) 1000 UNITS tablet   Oral   Take 1,000 Units  by mouth every morning.          . Coenzyme Q10 (CO Q 10) 100 MG CAPS   Oral   Take 100 mg by mouth every morning.          . docusate sodium (COLACE) 100 MG capsule   Oral   Take 100 mg by mouth 2 (two) times daily.         Marland Kitchen donepezil (ARICEPT) 23 MG TABS tablet   Oral   Take 23 mg by mouth at bedtime.         Marland Kitchen esomeprazole (NEXIUM) 40 MG capsule   Oral   Take 40 mg by mouth at bedtime.          . fish oil-omega-3 fatty acids 1000 MG capsule   Oral   Take 2 g by mouth every morning.          . glyBURIDE-metformin (GLUCOVANCE) 5-500 MG per tablet   Oral   Take 2 tablets by mouth 2 (two) times daily with a meal.          . insulin glargine (LANTUS) 100 UNIT/ML injection   Subcutaneous   Inject 3-8 Units into the skin at bedtime. Give if BS 140-150= 3 units, 151-160=4 units, 161-170=5 units, 171-180= 6 units, >181=7units         . lisinopril (PRINIVIL,ZESTRIL) 20 MG tablet   Oral   Take 1 tablet (20 mg total) by mouth daily.   30 tablet   3   . megestrol (MEGACE) 400 MG/10ML suspension   Oral   Take 10 mLs (400 mg total) by mouth 2 (two) times daily.   240 mL   0   . metoprolol succinate (TOPROL-XL) 50 MG 24 hr tablet   Oral   Take 1 tablet (50 mg total) by mouth daily. Take with or immediately following a meal.   30 tablet   3   . mirtazapine (REMERON) 30 MG tablet   Oral   Take 30 mg by mouth at bedtime.         . polyethylene glycol (MIRALAX / GLYCOLAX) packet   Oral   Take 17 g by mouth daily as needed (Constipation).         Marland Kitchen spironolactone (ALDACTONE) 25 MG tablet   Oral   Take 25 mg by mouth daily.         Marland Kitchen tiotropium (SPIRIVA) 18 MCG inhalation capsule   Inhalation   Place 18 mcg into inhaler  and inhale daily as needed (shortness of breath).         . insulin aspart (NOVOLOG) 100 UNIT/ML injection   Subcutaneous   Inject 3 Units into the skin daily.         Marland Kitchen levofloxacin (LEVAQUIN) 500 MG tablet   Oral   Take 1  tablet (500 mg total) by mouth daily.   7 tablet   0   . ondansetron (ZOFRAN) 4 MG tablet   Oral   Take 1 tablet (4 mg total) by mouth every 6 (six) hours.   12 tablet   0    Triage Vitals: BP 105/58  Pulse 139  Temp(Src) 97.8 F (36.6 C) (Oral)  Resp 18  Ht _0  (1.626 m)  Wt 140 lb (63.504 kg)  BMI 24.02 kg/m2  SpO2 96%  Physical Exam  Nursing note and vitals reviewed. Constitutional: She is oriented to person, place, and time. No distress.  elderly  HENT:  Head: Normocephalic and atraumatic.  Mouth/Throat: Oropharynx is clear and moist.  Eyes: Pupils are equal, round, and reactive to light.  Neck: Neck supple.  Cardiovascular: Regular rhythm and normal heart sounds.   tachycardia  Pulmonary/Chest: Effort normal and breath sounds normal. No respiratory distress. She has no wheezes.  L mastectomy  Abdominal: Soft. Bowel sounds are normal. There is no tenderness. There is no rebound and no guarding.  Musculoskeletal: She exhibits no edema.  Neurological: She is alert and oriented to person, place, and time.  5/5 strength in all 4 extremities  Skin: Skin is warm and dry.  Psychiatric: She has a normal mood and affect.    ED Course  Procedures (including critical care time)  DIAGNOSTIC STUDIES: Oxygen Saturation is 96% on room air, normal by my interpretation.    COORDINATION OF CARE: 7:43 AM- Will order an x-ray of the abdomen, blood labs, and UA. Will order IV fluids to manage symptoms. Discussed treatment plan with patient at bedside and patient verbalized agreement.   10:04 AM- Patient reports feeling much better at this time.   Labs Review Labs Reviewed  CBC WITH DIFFERENTIAL - Abnormal; Notable for the following:    RBC 3.85 (*)    MCH 35.6 (*)    All other components within normal limits  COMPREHENSIVE METABOLIC PANEL - Abnormal; Notable for the following:    Potassium 3.4 (*)    CO2 18 (*)    Glucose, Bld 295 (*)    GFR calc non Af Amer 85 (*)     All other components within normal limits  URINALYSIS, ROUTINE W REFLEX MICROSCOPIC - Abnormal; Notable for the following:    Glucose, UA >1000 (*)    Hgb urine dipstick TRACE (*)    Ketones, ur 15 (*)    Protein, ur 30 (*)    Leukocytes, UA TRACE (*)    All other components within normal limits  URINE MICROSCOPIC-ADD ON - Abnormal; Notable for the following:    Squamous Epithelial / LPF FEW (*)    All other components within normal limits  BASIC METABOLIC PANEL - Abnormal; Notable for the following:    Potassium 3.6 (*)    Glucose, Bld 201 (*)    GFR calc non Af Amer 89 (*)    All other components within normal limits  LIPASE, BLOOD  TROPONIN I   Imaging Review Dg Abd Acute W/chest  12/01/2013   CLINICAL DATA:  Nausea, weakness and tachycardia.  EXAM: ACUTE ABDOMEN SERIES (ABDOMEN 2 VIEW &  CHEST 1 VIEW)  COMPARISON:  Single view of the chest 06/23/2013. CT chest 06/23/2013.  FINDINGS: The lungs are emphysematous. There is airspace disease in the right lower lung zone. The left lung is clear. Heart size is normal. No pneumothorax or pleural effusion.  Two views of the abdomen show no free intraperitoneal air. The bowel gas pattern is nonobstructive. Cholecystectomy clips are noted. No focal bony abnormality is identified.  IMPRESSION: Right lower lobe airspace disease most consistent with pneumonia. Recommend followup films to clearing.  Emphysema.  Benign appearing abdomen.   Electronically Signed   By: Inge Rise M.D.   On: 12/01/2013 08:55    EKG Interpretation    Date/Time:  Friday December 01 2013 07:34:12 EST Ventricular Rate:  139 PR Interval:  344 QRS Duration: 82 QT Interval:  332 QTC Calculation: 505 R Axis:   -38 Text Interpretation:  Sinus tachycardia with 1st degree A-V block Left axis deviation Nonspecific ST and T wave abnormality Abnormal ECG When compared with ECG of 22-Jun-2013 23:58, Significant changes have occurred tachycardia when compared to prior  Confirmed by Lorieann Argueta  MD, Ebunoluwa Gernert ([id number]) on 12/01/2013 8:16:30 AM            MDM   1. Community acquired pneumonia   2. Vomiting   3. Dehydration    Patient presents with vomiting and dehydration.  INitially noted to be in sinus tachycardia; HR during my evaluation ranged from 100-120.  Patient given fluids and lab work initiated.  W/U notable for hypochloremia and dehydration.  CXR suggestive of PNA.  Patient denies any fever but given viral like symptoms with vomiting and diarrhea will treat for CAP.  Patient reports improvement with fluids.  HR normalized and repeat labwork improvement.  Patient discharged with her daughters.  GIven strict return precautions.  After history, exam, and medical workup I feel the patient has been appropriately medically screened and is safe for discharge home. Pertinent diagnoses were discussed with the patient. Patient was given return precautions.  I personally performed the services described in this documentation, which was scribed in my presence. The recorded information has been reviewed and is accurate.     Michele Hacker, MD 12/02/13 [phone]

## 2013-12-04 DIAGNOSIS — F0390 Unspecified dementia without behavioral disturbance: Secondary | ICD-10-CM | POA: Diagnosis not present

## 2013-12-04 DIAGNOSIS — E538 Deficiency of other specified B group vitamins: Secondary | ICD-10-CM | POA: Diagnosis not present

## 2013-12-04 DIAGNOSIS — E119 Type 2 diabetes mellitus without complications: Secondary | ICD-10-CM | POA: Diagnosis not present

## 2013-12-14 ENCOUNTER — Ambulatory Visit (INDEPENDENT_AMBULATORY_CARE_PROVIDER_SITE_OTHER): Payer: Medicare Other | Admitting: Otolaryngology

## 2013-12-14 DIAGNOSIS — D37039 Neoplasm of uncertain behavior of the major salivary glands, unspecified: Secondary | ICD-10-CM | POA: Diagnosis not present

## 2013-12-14 DIAGNOSIS — R220 Localized swelling, mass and lump, head: Secondary | ICD-10-CM | POA: Diagnosis not present

## 2013-12-15 DIAGNOSIS — I6529 Occlusion and stenosis of unspecified carotid artery: Secondary | ICD-10-CM | POA: Diagnosis not present

## 2013-12-26 ENCOUNTER — Ambulatory Visit: Payer: Medicare Other | Admitting: Internal Medicine

## 2014-01-16 DIAGNOSIS — R5383 Other fatigue: Secondary | ICD-10-CM | POA: Diagnosis not present

## 2014-01-16 DIAGNOSIS — F0390 Unspecified dementia without behavioral disturbance: Secondary | ICD-10-CM | POA: Diagnosis not present

## 2014-01-16 DIAGNOSIS — R300 Dysuria: Secondary | ICD-10-CM | POA: Diagnosis not present

## 2014-01-16 DIAGNOSIS — R5381 Other malaise: Secondary | ICD-10-CM | POA: Diagnosis not present

## 2014-01-16 DIAGNOSIS — F411 Generalized anxiety disorder: Secondary | ICD-10-CM | POA: Diagnosis not present

## 2014-01-17 ENCOUNTER — Encounter: Payer: Self-pay | Admitting: Gastroenterology

## 2014-01-23 DIAGNOSIS — B351 Tinea unguium: Secondary | ICD-10-CM | POA: Diagnosis not present

## 2014-01-23 DIAGNOSIS — E1149 Type 2 diabetes mellitus with other diabetic neurological complication: Secondary | ICD-10-CM | POA: Diagnosis not present

## 2014-02-01 DIAGNOSIS — F0390 Unspecified dementia without behavioral disturbance: Secondary | ICD-10-CM | POA: Diagnosis not present

## 2014-02-01 DIAGNOSIS — Z452 Encounter for adjustment and management of vascular access device: Secondary | ICD-10-CM | POA: Diagnosis not present

## 2014-02-01 DIAGNOSIS — I10 Essential (primary) hypertension: Secondary | ICD-10-CM | POA: Diagnosis not present

## 2014-02-01 DIAGNOSIS — J449 Chronic obstructive pulmonary disease, unspecified: Secondary | ICD-10-CM | POA: Diagnosis not present

## 2014-02-01 DIAGNOSIS — F411 Generalized anxiety disorder: Secondary | ICD-10-CM | POA: Diagnosis not present

## 2014-02-01 DIAGNOSIS — Z9181 History of falling: Secondary | ICD-10-CM | POA: Diagnosis not present

## 2014-02-01 DIAGNOSIS — Z794 Long term (current) use of insulin: Secondary | ICD-10-CM | POA: Diagnosis not present

## 2014-02-01 DIAGNOSIS — E119 Type 2 diabetes mellitus without complications: Secondary | ICD-10-CM | POA: Diagnosis not present

## 2014-02-03 DIAGNOSIS — J449 Chronic obstructive pulmonary disease, unspecified: Secondary | ICD-10-CM | POA: Diagnosis not present

## 2014-02-03 DIAGNOSIS — E119 Type 2 diabetes mellitus without complications: Secondary | ICD-10-CM | POA: Diagnosis not present

## 2014-02-03 DIAGNOSIS — I10 Essential (primary) hypertension: Secondary | ICD-10-CM | POA: Diagnosis not present

## 2014-02-03 DIAGNOSIS — F411 Generalized anxiety disorder: Secondary | ICD-10-CM | POA: Diagnosis not present

## 2014-02-03 DIAGNOSIS — Z9181 History of falling: Secondary | ICD-10-CM | POA: Diagnosis not present

## 2014-02-03 DIAGNOSIS — F0390 Unspecified dementia without behavioral disturbance: Secondary | ICD-10-CM | POA: Diagnosis not present

## 2014-02-05 DIAGNOSIS — E119 Type 2 diabetes mellitus without complications: Secondary | ICD-10-CM | POA: Diagnosis not present

## 2014-02-05 DIAGNOSIS — F411 Generalized anxiety disorder: Secondary | ICD-10-CM | POA: Diagnosis not present

## 2014-02-05 DIAGNOSIS — F0390 Unspecified dementia without behavioral disturbance: Secondary | ICD-10-CM | POA: Diagnosis not present

## 2014-02-05 DIAGNOSIS — Z9181 History of falling: Secondary | ICD-10-CM | POA: Diagnosis not present

## 2014-02-05 DIAGNOSIS — J449 Chronic obstructive pulmonary disease, unspecified: Secondary | ICD-10-CM | POA: Diagnosis not present

## 2014-02-05 DIAGNOSIS — I10 Essential (primary) hypertension: Secondary | ICD-10-CM | POA: Diagnosis not present

## 2014-02-06 DIAGNOSIS — J449 Chronic obstructive pulmonary disease, unspecified: Secondary | ICD-10-CM | POA: Diagnosis not present

## 2014-02-06 DIAGNOSIS — I10 Essential (primary) hypertension: Secondary | ICD-10-CM | POA: Diagnosis not present

## 2014-02-06 DIAGNOSIS — F411 Generalized anxiety disorder: Secondary | ICD-10-CM | POA: Diagnosis not present

## 2014-02-06 DIAGNOSIS — E119 Type 2 diabetes mellitus without complications: Secondary | ICD-10-CM | POA: Diagnosis not present

## 2014-02-06 DIAGNOSIS — F0390 Unspecified dementia without behavioral disturbance: Secondary | ICD-10-CM | POA: Diagnosis not present

## 2014-02-06 DIAGNOSIS — Z9181 History of falling: Secondary | ICD-10-CM | POA: Diagnosis not present

## 2014-02-07 DIAGNOSIS — F0390 Unspecified dementia without behavioral disturbance: Secondary | ICD-10-CM | POA: Diagnosis not present

## 2014-02-07 DIAGNOSIS — I10 Essential (primary) hypertension: Secondary | ICD-10-CM | POA: Diagnosis not present

## 2014-02-07 DIAGNOSIS — Z9181 History of falling: Secondary | ICD-10-CM | POA: Diagnosis not present

## 2014-02-07 DIAGNOSIS — E119 Type 2 diabetes mellitus without complications: Secondary | ICD-10-CM | POA: Diagnosis not present

## 2014-02-07 DIAGNOSIS — J449 Chronic obstructive pulmonary disease, unspecified: Secondary | ICD-10-CM | POA: Diagnosis not present

## 2014-02-07 DIAGNOSIS — F411 Generalized anxiety disorder: Secondary | ICD-10-CM | POA: Diagnosis not present

## 2014-02-08 DIAGNOSIS — F0390 Unspecified dementia without behavioral disturbance: Secondary | ICD-10-CM | POA: Diagnosis not present

## 2014-02-08 DIAGNOSIS — E119 Type 2 diabetes mellitus without complications: Secondary | ICD-10-CM | POA: Diagnosis not present

## 2014-02-08 DIAGNOSIS — F411 Generalized anxiety disorder: Secondary | ICD-10-CM | POA: Diagnosis not present

## 2014-02-08 DIAGNOSIS — I10 Essential (primary) hypertension: Secondary | ICD-10-CM | POA: Diagnosis not present

## 2014-02-08 DIAGNOSIS — J449 Chronic obstructive pulmonary disease, unspecified: Secondary | ICD-10-CM | POA: Diagnosis not present

## 2014-02-08 DIAGNOSIS — Z9181 History of falling: Secondary | ICD-10-CM | POA: Diagnosis not present

## 2014-02-12 DIAGNOSIS — F0390 Unspecified dementia without behavioral disturbance: Secondary | ICD-10-CM | POA: Diagnosis not present

## 2014-02-12 DIAGNOSIS — E119 Type 2 diabetes mellitus without complications: Secondary | ICD-10-CM | POA: Diagnosis not present

## 2014-02-12 DIAGNOSIS — I10 Essential (primary) hypertension: Secondary | ICD-10-CM | POA: Diagnosis not present

## 2014-02-12 DIAGNOSIS — Z9181 History of falling: Secondary | ICD-10-CM | POA: Diagnosis not present

## 2014-02-12 DIAGNOSIS — F411 Generalized anxiety disorder: Secondary | ICD-10-CM | POA: Diagnosis not present

## 2014-02-12 DIAGNOSIS — J449 Chronic obstructive pulmonary disease, unspecified: Secondary | ICD-10-CM | POA: Diagnosis not present

## 2014-02-14 DIAGNOSIS — F411 Generalized anxiety disorder: Secondary | ICD-10-CM | POA: Diagnosis not present

## 2014-02-14 DIAGNOSIS — F0390 Unspecified dementia without behavioral disturbance: Secondary | ICD-10-CM | POA: Diagnosis not present

## 2014-02-14 DIAGNOSIS — E119 Type 2 diabetes mellitus without complications: Secondary | ICD-10-CM | POA: Diagnosis not present

## 2014-02-14 DIAGNOSIS — J449 Chronic obstructive pulmonary disease, unspecified: Secondary | ICD-10-CM | POA: Diagnosis not present

## 2014-02-14 DIAGNOSIS — Z9181 History of falling: Secondary | ICD-10-CM | POA: Diagnosis not present

## 2014-02-14 DIAGNOSIS — I10 Essential (primary) hypertension: Secondary | ICD-10-CM | POA: Diagnosis not present

## 2014-02-15 DIAGNOSIS — J449 Chronic obstructive pulmonary disease, unspecified: Secondary | ICD-10-CM | POA: Diagnosis not present

## 2014-02-15 DIAGNOSIS — Z9181 History of falling: Secondary | ICD-10-CM | POA: Diagnosis not present

## 2014-02-15 DIAGNOSIS — E119 Type 2 diabetes mellitus without complications: Secondary | ICD-10-CM | POA: Diagnosis not present

## 2014-02-15 DIAGNOSIS — F411 Generalized anxiety disorder: Secondary | ICD-10-CM | POA: Diagnosis not present

## 2014-02-15 DIAGNOSIS — F0390 Unspecified dementia without behavioral disturbance: Secondary | ICD-10-CM | POA: Diagnosis not present

## 2014-02-15 DIAGNOSIS — I10 Essential (primary) hypertension: Secondary | ICD-10-CM | POA: Diagnosis not present

## 2014-02-16 ENCOUNTER — Emergency Department (HOSPITAL_COMMUNITY): Payer: Medicare Other

## 2014-02-16 ENCOUNTER — Inpatient Hospital Stay (HOSPITAL_COMMUNITY)
Admission: EM | Admit: 2014-02-16 | Discharge: 2014-02-20 | DRG: 312 | Disposition: A | Discharge status: Home Health | Payer: Medicare Other | Attending: Internal Medicine | Admitting: Internal Medicine

## 2014-02-16 ENCOUNTER — Observation Stay (HOSPITAL_COMMUNITY): Payer: Medicare Other

## 2014-02-16 ENCOUNTER — Encounter (HOSPITAL_COMMUNITY): Payer: Self-pay | Admitting: Emergency Medicine

## 2014-02-16 DIAGNOSIS — R918 Other nonspecific abnormal finding of lung field: Secondary | ICD-10-CM | POA: Diagnosis not present

## 2014-02-16 DIAGNOSIS — N390 Urinary tract infection, site not specified: Secondary | ICD-10-CM

## 2014-02-16 DIAGNOSIS — E785 Hyperlipidemia, unspecified: Secondary | ICD-10-CM | POA: Diagnosis not present

## 2014-02-16 DIAGNOSIS — Z7982 Long term (current) use of aspirin: Secondary | ICD-10-CM | POA: Diagnosis not present

## 2014-02-16 DIAGNOSIS — Z8051 Family history of malignant neoplasm of kidney: Secondary | ICD-10-CM

## 2014-02-16 DIAGNOSIS — R269 Unspecified abnormalities of gait and mobility: Secondary | ICD-10-CM | POA: Diagnosis present

## 2014-02-16 DIAGNOSIS — R053 Chronic cough: Secondary | ICD-10-CM

## 2014-02-16 DIAGNOSIS — R5381 Other malaise: Secondary | ICD-10-CM

## 2014-02-16 DIAGNOSIS — Z79899 Other long term (current) drug therapy: Secondary | ICD-10-CM | POA: Diagnosis not present

## 2014-02-16 DIAGNOSIS — I951 Orthostatic hypotension: Secondary | ICD-10-CM | POA: Diagnosis not present

## 2014-02-16 DIAGNOSIS — I6529 Occlusion and stenosis of unspecified carotid artery: Secondary | ICD-10-CM | POA: Diagnosis present

## 2014-02-16 DIAGNOSIS — R059 Cough, unspecified: Secondary | ICD-10-CM | POA: Diagnosis not present

## 2014-02-16 DIAGNOSIS — R410 Disorientation, unspecified: Secondary | ICD-10-CM

## 2014-02-16 DIAGNOSIS — R5383 Other fatigue: Secondary | ICD-10-CM

## 2014-02-16 DIAGNOSIS — I959 Hypotension, unspecified: Secondary | ICD-10-CM

## 2014-02-16 DIAGNOSIS — F329 Major depressive disorder, single episode, unspecified: Secondary | ICD-10-CM | POA: Diagnosis present

## 2014-02-16 DIAGNOSIS — Z8249 Family history of ischemic heart disease and other diseases of the circulatory system: Secondary | ICD-10-CM | POA: Diagnosis not present

## 2014-02-16 DIAGNOSIS — Z87891 Personal history of nicotine dependence: Secondary | ICD-10-CM

## 2014-02-16 DIAGNOSIS — E871 Hypo-osmolality and hyponatremia: Secondary | ICD-10-CM

## 2014-02-16 DIAGNOSIS — R9439 Abnormal result of other cardiovascular function study: Secondary | ICD-10-CM

## 2014-02-16 DIAGNOSIS — Z993 Dependence on wheelchair: Secondary | ICD-10-CM | POA: Diagnosis not present

## 2014-02-16 DIAGNOSIS — Z720 Tobacco use: Secondary | ICD-10-CM

## 2014-02-16 DIAGNOSIS — I10 Essential (primary) hypertension: Secondary | ICD-10-CM | POA: Diagnosis not present

## 2014-02-16 DIAGNOSIS — H538 Other visual disturbances: Secondary | ICD-10-CM | POA: Diagnosis present

## 2014-02-16 DIAGNOSIS — M899 Disorder of bone, unspecified: Secondary | ICD-10-CM

## 2014-02-16 DIAGNOSIS — IMO0002 Reserved for concepts with insufficient information to code with codable children: Secondary | ICD-10-CM

## 2014-02-16 DIAGNOSIS — Z794 Long term (current) use of insulin: Secondary | ICD-10-CM | POA: Diagnosis not present

## 2014-02-16 DIAGNOSIS — E1149 Type 2 diabetes mellitus with other diabetic neurological complication: Secondary | ICD-10-CM | POA: Diagnosis not present

## 2014-02-16 DIAGNOSIS — R0609 Other forms of dyspnea: Secondary | ICD-10-CM | POA: Diagnosis not present

## 2014-02-16 DIAGNOSIS — I471 Supraventricular tachycardia, unspecified: Secondary | ICD-10-CM

## 2014-02-16 DIAGNOSIS — J438 Other emphysema: Secondary | ICD-10-CM | POA: Diagnosis present

## 2014-02-16 DIAGNOSIS — R05 Cough: Secondary | ICD-10-CM

## 2014-02-16 DIAGNOSIS — F411 Generalized anxiety disorder: Secondary | ICD-10-CM

## 2014-02-16 DIAGNOSIS — F0150 Vascular dementia without behavioral disturbance: Secondary | ICD-10-CM | POA: Diagnosis present

## 2014-02-16 DIAGNOSIS — R531 Weakness: Secondary | ICD-10-CM | POA: Diagnosis present

## 2014-02-16 DIAGNOSIS — E1142 Type 2 diabetes mellitus with diabetic polyneuropathy: Secondary | ICD-10-CM | POA: Diagnosis not present

## 2014-02-16 DIAGNOSIS — W19XXXA Unspecified fall, initial encounter: Secondary | ICD-10-CM

## 2014-02-16 DIAGNOSIS — E119 Type 2 diabetes mellitus without complications: Secondary | ICD-10-CM | POA: Diagnosis present

## 2014-02-16 DIAGNOSIS — I2510 Atherosclerotic heart disease of native coronary artery without angina pectoris: Secondary | ICD-10-CM

## 2014-02-16 DIAGNOSIS — R29898 Other symptoms and signs involving the musculoskeletal system: Secondary | ICD-10-CM | POA: Diagnosis not present

## 2014-02-16 DIAGNOSIS — R7401 Elevation of levels of liver transaminase levels: Secondary | ICD-10-CM

## 2014-02-16 DIAGNOSIS — Z853 Personal history of malignant neoplasm of breast: Secondary | ICD-10-CM

## 2014-02-16 DIAGNOSIS — IMO0001 Reserved for inherently not codable concepts without codable children: Secondary | ICD-10-CM | POA: Diagnosis not present

## 2014-02-16 DIAGNOSIS — G459 Transient cerebral ischemic attack, unspecified: Secondary | ICD-10-CM | POA: Diagnosis not present

## 2014-02-16 DIAGNOSIS — Z801 Family history of malignant neoplasm of trachea, bronchus and lung: Secondary | ICD-10-CM | POA: Diagnosis not present

## 2014-02-16 DIAGNOSIS — R55 Syncope and collapse: Secondary | ICD-10-CM

## 2014-02-16 DIAGNOSIS — F3289 Other specified depressive episodes: Secondary | ICD-10-CM | POA: Diagnosis present

## 2014-02-16 DIAGNOSIS — R000 Tachycardia, unspecified: Secondary | ICD-10-CM

## 2014-02-16 DIAGNOSIS — Z9010 Acquired absence of unspecified breast and nipple: Secondary | ICD-10-CM | POA: Diagnosis not present

## 2014-02-16 DIAGNOSIS — M6281 Muscle weakness (generalized): Secondary | ICD-10-CM | POA: Diagnosis not present

## 2014-02-16 DIAGNOSIS — R4182 Altered mental status, unspecified: Secondary | ICD-10-CM | POA: Diagnosis not present

## 2014-02-16 DIAGNOSIS — I672 Cerebral atherosclerosis: Secondary | ICD-10-CM | POA: Diagnosis present

## 2014-02-16 DIAGNOSIS — L905 Scar conditions and fibrosis of skin: Secondary | ICD-10-CM

## 2014-02-16 DIAGNOSIS — E1165 Type 2 diabetes mellitus with hyperglycemia: Secondary | ICD-10-CM

## 2014-02-16 DIAGNOSIS — R259 Unspecified abnormal involuntary movements: Secondary | ICD-10-CM

## 2014-02-16 DIAGNOSIS — R740 Nonspecific elevation of levels of transaminase and lactic acid dehydrogenase [LDH]: Secondary | ICD-10-CM

## 2014-02-16 DIAGNOSIS — M949 Disorder of cartilage, unspecified: Secondary | ICD-10-CM

## 2014-02-16 DIAGNOSIS — R0600 Dyspnea, unspecified: Secondary | ICD-10-CM

## 2014-02-16 DIAGNOSIS — K922 Gastrointestinal hemorrhage, unspecified: Secondary | ICD-10-CM

## 2014-02-16 DIAGNOSIS — E860 Dehydration: Secondary | ICD-10-CM

## 2014-02-16 DIAGNOSIS — I369 Nonrheumatic tricuspid valve disorder, unspecified: Secondary | ICD-10-CM | POA: Diagnosis not present

## 2014-02-16 HISTORY — DX: Peripheral vascular disease, unspecified: I73.9

## 2014-02-16 HISTORY — DX: Disorder of arteries and arterioles, unspecified: I77.9

## 2014-02-16 HISTORY — DX: Orthostatic hypotension: I95.1

## 2014-02-16 LAB — URINALYSIS, ROUTINE W REFLEX MICROSCOPIC
Bilirubin Urine: NEGATIVE
Glucose, UA: 500 mg/dL — AB
Hgb urine dipstick: NEGATIVE
Ketones, ur: NEGATIVE mg/dL
Nitrite: NEGATIVE
Specific Gravity, Urine: 1.02 (ref 1.005–1.030)
Urobilinogen, UA: 0.2 mg/dL (ref 0.0–1.0)
pH: 6 (ref 5.0–8.0)

## 2014-02-16 LAB — CBC WITH DIFFERENTIAL/PLATELET
Basophils Absolute: 0.1 10*3/uL (ref 0.0–0.1)
Basophils Relative: 2 % — ABNORMAL HIGH (ref 0–1)
Eosinophils Absolute: 0.1 10*3/uL (ref 0.0–0.7)
Eosinophils Relative: 2 % (ref 0–5)
HCT: 36.7 % (ref 36.0–46.0)
Hemoglobin: 12.8 g/dL (ref 12.0–15.0)
Lymphocytes Relative: 48 % — ABNORMAL HIGH (ref 12–46)
Lymphs Abs: 2.5 10*3/uL (ref 0.7–4.0)
MCH: 34.3 pg — ABNORMAL HIGH (ref 26.0–34.0)
MCHC: 34.9 g/dL (ref 30.0–36.0)
MCV: 98.4 fL (ref 78.0–100.0)
Monocytes Absolute: 0.3 10*3/uL (ref 0.1–1.0)
Monocytes Relative: 5 % (ref 3–12)
Neutro Abs: 2.2 10*3/uL (ref 1.7–7.7)
Neutrophils Relative %: 43 % (ref 43–77)
Platelets: 270 10*3/uL (ref 150–400)
RBC: 3.73 MIL/uL — ABNORMAL LOW (ref 3.87–5.11)
RDW: 13.3 % (ref 11.5–15.5)
WBC: 5.2 10*3/uL (ref 4.0–10.5)

## 2014-02-16 LAB — GLUCOSE, CAPILLARY
Glucose-Capillary: 99 mg/dL (ref 70–99)
Glucose-Capillary: 383 mg/dL — ABNORMAL HIGH (ref 70–99)

## 2014-02-16 LAB — COMPREHENSIVE METABOLIC PANEL
ALT: 12 U/L (ref 0–35)
AST: 15 U/L (ref 0–37)
Albumin: 3.6 g/dL (ref 3.5–5.2)
Alkaline Phosphatase: 87 U/L (ref 39–117)
BUN: 14 mg/dL (ref 6–23)
CO2: 24 mEq/L (ref 19–32)
Calcium: 9.9 mg/dL (ref 8.4–10.5)
Chloride: 103 mEq/L (ref 96–112)
Creatinine, Ser: 0.61 mg/dL (ref 0.50–1.10)
GFR calc Af Amer: >90 mL/min (ref >90)
GFR calc non Af Amer: 83 mL/min — ABNORMAL LOW (ref >90)
Glucose, Bld: 140 mg/dL — ABNORMAL HIGH (ref 70–99)
Potassium: 3.7 mEq/L (ref 3.7–5.3)
Sodium: 141 mEq/L (ref 137–147)
Total Bilirubin: 0.5 mg/dL (ref 0.3–1.2)
Total Protein: 7.9 g/dL (ref 6.0–8.3)

## 2014-02-16 LAB — TROPONIN I
Troponin I: <0.3 ng/mL (ref <0.30)
Troponin I: <0.3 ng/mL (ref <0.30)

## 2014-02-16 LAB — URINE MICROSCOPIC-ADD ON

## 2014-02-16 MED ORDER — METOPROLOL TARTRATE 1 MG/ML IV SOLN
2.5000 mg | Freq: Once | INTRAVENOUS | Status: AC
  Administered 2014-02-16: 2.5 mg via INTRAVENOUS
  Filled 2014-02-16: qty 5

## 2014-02-16 MED ORDER — METOPROLOL SUCCINATE ER 50 MG PO TB24
50.0000 mg | ORAL_TABLET | Freq: Every day | ORAL | Status: DC
  Administered 2014-02-17 – 2014-02-20 (×4): 50 mg via ORAL
  Filled 2014-02-16 (×4): qty 1

## 2014-02-16 MED ORDER — PANTOPRAZOLE SODIUM 40 MG PO TBEC
80.0000 mg | DELAYED_RELEASE_TABLET | Freq: Every day | ORAL | Status: DC
  Administered 2014-02-16 – 2014-02-20 (×5): 80 mg via ORAL
  Filled 2014-02-16 (×6): qty 2

## 2014-02-16 MED ORDER — ENOXAPARIN SODIUM 40 MG/0.4ML ~~LOC~~ SOLN
40.0000 mg | SUBCUTANEOUS | Status: DC
  Administered 2014-02-16 – 2014-02-19 (×4): 40 mg via SUBCUTANEOUS
  Filled 2014-02-16 (×4): qty 0.4

## 2014-02-16 MED ORDER — MIRTAZAPINE 30 MG PO TABS
30.0000 mg | ORAL_TABLET | Freq: Every day | ORAL | Status: DC
  Administered 2014-02-16 – 2014-02-19 (×4): 30 mg via ORAL
  Filled 2014-02-16 (×4): qty 1

## 2014-02-16 MED ORDER — MEGESTROL ACETATE 400 MG/10ML PO SUSP
400.0000 mg | Freq: Two times a day (BID) | ORAL | Status: DC
  Administered 2014-02-17 – 2014-02-20 (×3): 400 mg via ORAL
  Filled 2014-02-16 (×7): qty 10

## 2014-02-16 MED ORDER — LEVOFLOXACIN 500 MG PO TABS
500.0000 mg | ORAL_TABLET | Freq: Once | ORAL | Status: AC
  Administered 2014-02-16: 500 mg via ORAL
  Filled 2014-02-16: qty 1

## 2014-02-16 MED ORDER — ASPIRIN 325 MG PO TABS
325.0000 mg | ORAL_TABLET | Freq: Every day | ORAL | Status: DC
  Administered 2014-02-16 – 2014-02-17 (×2): 325 mg via ORAL
  Filled 2014-02-16 (×3): qty 1

## 2014-02-16 MED ORDER — INSULIN ASPART 100 UNIT/ML ~~LOC~~ SOLN
0.0000 [IU] | Freq: Three times a day (TID) | SUBCUTANEOUS | Status: DC
  Administered 2014-02-17: 2 [IU] via SUBCUTANEOUS
  Administered 2014-02-17: 3 [IU] via SUBCUTANEOUS
  Administered 2014-02-17: 5 [IU] via SUBCUTANEOUS
  Administered 2014-02-18: 7 [IU] via SUBCUTANEOUS

## 2014-02-16 MED ORDER — DOCUSATE SODIUM 100 MG PO CAPS
100.0000 mg | ORAL_CAPSULE | Freq: Every day | ORAL | Status: DC
  Administered 2014-02-16 – 2014-02-19 (×3): 100 mg via ORAL
  Filled 2014-02-16 (×3): qty 1

## 2014-02-16 MED ORDER — LEVOFLOXACIN 250 MG PO TABS
250.0000 mg | ORAL_TABLET | Freq: Every day | ORAL | Status: DC
  Administered 2014-02-17 – 2014-02-20 (×4): 250 mg via ORAL
  Filled 2014-02-16 (×4): qty 1

## 2014-02-16 MED ORDER — INSULIN ASPART 100 UNIT/ML ~~LOC~~ SOLN
4.0000 [IU] | Freq: Once | SUBCUTANEOUS | Status: AC
  Administered 2014-02-16: 4 [IU] via SUBCUTANEOUS

## 2014-02-16 MED ORDER — TIOTROPIUM BROMIDE MONOHYDRATE 18 MCG IN CAPS
18.0000 ug | ORAL_CAPSULE | Freq: Every day | RESPIRATORY_TRACT | Status: DC | PRN
  Administered 2014-02-17 – 2014-02-18 (×2): 18 ug via RESPIRATORY_TRACT
  Filled 2014-02-16: qty 5

## 2014-02-16 NOTE — ED Notes (Signed)
Pt son and daughter report pt woke up at 0830 with generalized weakness-greater on right side and confusion.

## 2014-02-16 NOTE — ED Provider Notes (Addendum)
CSN: [id number]     Arrival date & time 02/16/14  1112 History   First MD Initiated Contact with Patient 02/16/14 1120    This chart was scribed for Tanna Furry, MD by Terressa Koyanagi, ED Scribe. This patient was seen in room APA10/APA10 and the patient's care was started at 11:23 AM.  PCP: [name], MD  Chief Complaint  Patient presents with  . Weakness   The history is provided by the patient and a relative. No language interpreter was used.   HPI Comments: [name] is a 80 y.o. female, with a history of DM, HTN, HLD, and parotid tumor, who presents to the Emergency Department complaining of acute, right sided numbness with associated severe right leg pain, onset this morning. Pts daughter reports that when pt awoke this morning she was unable to hold herself up and slumped over to the right side whenever unsupported by her daughter. Pts daughter also reports that pt started physical therapy last week and while walking in physical therapy her right side was slightly slumped over.  Pt complains of associated left eye vision change; generalized weakness; and HA. Pt denies chest pain, appetite change, vomiting, diarrhea, abd pain, blurred vision.    Past Medical History  Diagnosis Date  . Hemorrhage of gastrointestinal tract, unspecified   . Other malaise and fatigue   . Tobacco use disorder   . Parotid tumor   . Neoplasm of malignant     breast  ,HX   . Diabetes mellitus   . Osteopenia   . Hypertension   . Hyperlipidemia   . Depression   . Anxiety state, unspecified   . Chronic headaches   . Carotid artery disease   . Orthostatic hypotension    Past Surgical History  Procedure Laterality Date  . Mastectomy  1994    left breast -related to breast cancer  . Vesicovaginal fistula closure w/ tah  1979  . Cholecystectomy  2008  . Hemorrhoid surgery  1960s  . Laparoscopic appendectomy N/A 04/13/2013    Procedure: APPENDECTOMY LAPAROSCOPIC;  Surgeon: Donato Heinz, MD;   Location: [facility];  Service: General;  Laterality: N/A;  . Colonoscopy  2010    Dr. Oneida Alar: single tubular adenoma, one hyperplastic polyp. Next TCS 2020 health permitting.   Family History  Problem Relation Age of Onset  . Heart failure Mother     CHF  . Cirrhosis Brother   . Lung cancer Father     brain mets  . Kidney cancer Mother   . Kidney cancer Sister    History  Substance Use Topics  . Smoking status: Former Smoker -- 0.50 packs/day for 40 years    Types: Cigarettes    Quit date: 03/30/2013  . Smokeless tobacco: Not on file  . Alcohol Use: No   OB History   Grav Para Term Preterm Abortions TAB SAB Ect Mult Living                 Review of Systems  Constitutional: Positive for fatigue. Negative for fever, chills, diaphoresis and appetite change.  HENT: Negative for mouth sores, sore throat and trouble swallowing.   Eyes: Positive for visual disturbance (left eye vision change ).  Respiratory: Negative for cough, chest tightness, shortness of breath and wheezing.   Cardiovascular: Negative for chest pain.  Gastrointestinal: Negative for nausea, vomiting, abdominal pain, diarrhea and abdominal distention.  Endocrine: Negative for polydipsia, polyphagia and polyuria.  Genitourinary: Negative for dysuria, frequency and hematuria.  Musculoskeletal: Negative for gait problem.  Skin: Negative for color change, pallor and rash.  Neurological: Positive for numbness (Right Side ) and headaches. Negative for dizziness, syncope and light-headedness.  Hematological: Does not bruise/bleed easily.  Psychiatric/Behavioral: Negative for behavioral problems and confusion.      Allergies  Aricept; Namenda; Codeine; Latex; and Penicillins  Home Medications   Current Outpatient Rx  Name  Route  Sig  Dispense  Refill  . ALPRAZolam (NIRAVAM) 0.25 MG dissolvable tablet   Oral   Take 0.25 mg by mouth at bedtime as needed.           Marland Kitchen aspirin 325 MG tablet   Oral   Take 325 mg  by mouth daily.           . cholecalciferol (VITAMIN D) 1000 UNITS tablet   Oral   Take 1,000 Units by mouth every morning.          . Coenzyme Q10 (CO Q 10) 100 MG CAPS   Oral   Take 100 mg by mouth every morning.          . docusate sodium (COLACE) 100 MG capsule   Oral   Take 100 mg by mouth at bedtime.          Marland Kitchen esomeprazole (NEXIUM) 40 MG capsule   Oral   Take 40 mg by mouth at bedtime.          . fish oil-omega-3 fatty acids 1000 MG capsule   Oral   Take 1 g by mouth every morning.          . glyBURIDE-metformin (GLUCOVANCE) 5-500 MG per tablet   Oral   Take 2 tablets by mouth 2 (two) times daily with a meal.          . insulin aspart (NOVOLOG) 100 UNIT/ML injection   Subcutaneous   Inject 3 Units into the skin daily after breakfast.          . insulin glargine (LANTUS) 100 UNIT/ML injection   Subcutaneous   Inject 3-8 Units into the skin at bedtime. Give if BS 140-150= 3 units, 151-160=4 units, 161-170=5 units, 171-180= 6 units, >181=7units         . lisinopril (PRINIVIL,ZESTRIL) 20 MG tablet   Oral   Take 1 tablet (20 mg total) by mouth daily.   30 tablet   3   . megestrol (MEGACE) 400 MG/10ML suspension   Oral   Take 10 mLs (400 mg total) by mouth 2 (two) times daily.   240 mL   0   . metoprolol succinate (TOPROL-XL) 50 MG 24 hr tablet   Oral   Take 1 tablet (50 mg total) by mouth daily. Take with or immediately following a meal.   30 tablet   3   . mirtazapine (REMERON) 30 MG tablet   Oral   Take 30 mg by mouth at bedtime.         Marland Kitchen tiotropium (SPIRIVA) 18 MCG inhalation capsule   Inhalation   Place 18 mcg into inhaler and inhale daily as needed (shortness of breath).          Triage Vitals: BP 127/85  Pulse 77  Temp(Src) 98 F (36.7 C)  Resp 16  SpO2 97% Physical Exam  Constitutional: She is oriented to person, place, and time. She appears well-developed and well-nourished. No distress.  Generalized weakness   HENT:  Head: Normocephalic.  Eyes: Conjunctivae are normal. Pupils are equal, round, and reactive to  light. No scleral icterus.  Neck: Normal range of motion. Neck supple. No thyromegaly present.  Cardiovascular: Normal rate and regular rhythm.  Exam reveals no gallop and no friction rub.   No murmur heard. Pulmonary/Chest: Effort normal and breath sounds normal. No respiratory distress. She has no wheezes. She has no rales.  Abdominal: Soft. Bowel sounds are normal. She exhibits no distension. There is no tenderness. There is no rebound.  Musculoskeletal: Normal range of motion.  Neurological: She is alert and oriented to person, place, and time.  Patient has global weakness. She does need help to set. She can sit independently when assisted to do so. No localizing hemiparesis. No visual field deficit. No facial droop. No pronator drift.  Skin: Skin is warm and dry. No rash noted.  Psychiatric: She has a normal mood and affect. Her behavior is normal.    ED Course  Procedures (including critical care time) DIAGNOSTIC STUDIES: Oxygen Saturation is 97% on RA, adequate by my interpretation.    COORDINATION OF CARE:  11:30 AM-Discussed treatment plan which includes imaging, labs, EKG with pt at bedside and pt agreed to plan.     Labs Review Labs Reviewed  URINALYSIS, ROUTINE W REFLEX MICROSCOPIC - Abnormal; Notable for the following:    Glucose, UA 500 (*)    Protein, ur TRACE (*)    Leukocytes, UA SMALL (*)    All other components within normal limits  CBC WITH DIFFERENTIAL - Abnormal; Notable for the following:    RBC 3.73 (*)    MCH 34.3 (*)    Lymphocytes Relative 48 (*)    Basophils Relative 2 (*)    All other components within normal limits  COMPREHENSIVE METABOLIC PANEL - Abnormal; Notable for the following:    Glucose, Bld 140 (*)    GFR calc non Af Amer 83 (*)    All other components within normal limits  URINE MICROSCOPIC-ADD ON  TROPONIN I   Imaging Review Ct  Head Wo Contrast  02/16/2014   CLINICAL DATA:  Generalized weakness  EXAM: CT HEAD WITHOUT CONTRAST  TECHNIQUE: Contiguous axial images were obtained from the base of the skull through the vertex without intravenous contrast.  COMPARISON:  06/23/2013  FINDINGS: No sign of acute infarction. There are chronic small vessel changes throughout the white matter. There is an old left frontal cortical and subcortical infarction. No mass lesion, hemorrhage, hydrocephalus or extra-axial collection. No calvarial abnormality. No inflammatory sinus disease.  IMPRESSION: No acute finding. Chronic small vessel disease. 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   Electronically Signed   By: Nelson Chimes M.D.   On: 02/16/2014 12:07     EKG Interpretation None      MDM   Final diagnoses:  Weakness  UTI (lower urinary tract infection)  TIA (transient ischemic attack)      I personally performed the services described in this documentation, which was scribed in my presence. The recorded information has been reviewed and is accurate.  No neurological exam findings to suggest acute infarct. This may have been TIA. This may the generalized weakness due to her UTI. Urine has been cultured and given IV antibiotics. I have placed a call to the hospitalist regarding admission.  Tanna Furry, MD 02/16/14 [place], MD 02/16/14 [phone]

## 2014-02-16 NOTE — H&P (Addendum)
History and Physical  YESSIKA OTTE TMH:[id number] DOB: [birth year] DOA: 02/16/2014  Referring physician: Dr. Alyson Locket in ED PCP: [name], MD   Chief Complaint: Right-sided weakness  HPI:  80 year old woman presents the emergency department with history of right-sided weakness when she woke up this morning approximately 8 AM. Initial evaluation in the emergency department was notable for negative CT head, unremarkable blood work, and nonfocal neurologic examination per Dr. Jeneen Rinks. UTI discovered. Referred for observation and further evaluation.  Patient has early dementia and has limited ability to provide history. In addition history was obtained from daughter and son at bedside with whom the patient lives. Patient has generally been declining since 03/2013 at which time she underwent appendectomy for acute appendicitis. She had several hospitalizations thereafter, including 2 for generalized weakness in July 2014 at which time stroke evaluation was undertaken, carotid ultrasounds revealed bilateral ICA occlusion, MRI of the brain suggested chronic microvascular ischemia, MRI of the brain demonstrated widely patent posterior circulation. Neurology saw the patient that time, recommended statins and antiplatelet therapy for severe extracranial occlusive disease.  She has been essentially wheelchair-bound with very limited transfer/ambulation the last several months for unclear reasons. She also has developed dementia, intolerant to Namenda and Aricept secondary to rash.  For the last 2 weeks patient has had intermittent right-sided weakness as well as generalized lower extremity weakness. She was seen by her primary care physician for this very reason and outpatient physical therapy was pursued. Per family, physical therapist noted right-sided weakness at times as well as some pain in the right arm including yesterday.  Today, the patient woke her daughter up (dgt sleeps in her room) in AM to go to  bathroom, at which time the chart are noted right-sided weakness and difficulty standing and maintaining posture. No confusion, dysarthria or apparent difficulty swallowing. No seizure activity. After some time the patient was able to maintain posture and right-sided weakness improved. Per family "this was the worst" they had seen over the last 2 weeks. By the time she arrived at the emergency department she had no focal neurologic deficits. Currently the patient has no complaints except for some blurry vision in her left eye, duration unclear. Pain.  In the emergency department afebrile with stable vital signs. No hypoxia. Complete metabolic panel, troponin, CBC unremarkable. Urinalysis was grossly positive. CT of the head no acute abnormalities. EKG independently reviewed demonstrated normal sinus rhythm, left atrial enlargement, inverted T waves, consider lateral ischemia (previous tracing 12/01/2013 with nonspecific lateral ST changes).  Review of Systems:  Negative for fever, sore throat, rash, new muscle aches, chest pain, SOB, dysuria, bleeding, n/v/abdominal pain.  Past Medical History  Diagnosis Date  . Hemorrhage of gastrointestinal tract, unspecified   . Other malaise and fatigue   . Tobacco use disorder   . Parotid tumor   . Neoplasm of malignant     breast  ,HX   . Diabetes mellitus   . Osteopenia   . Hypertension   . Hyperlipidemia   . Depression   . Anxiety state, unspecified   . Chronic headaches   . Carotid artery disease   . Orthostatic hypotension     Past Surgical History  Procedure Laterality Date  . Mastectomy  1994    left breast -related to breast cancer  . Vesicovaginal fistula closure w/ tah  1979  . Cholecystectomy  2008  . Hemorrhoid surgery  1960s  . Laparoscopic appendectomy N/A 04/13/2013    Procedure: APPENDECTOMY LAPAROSCOPIC;  Surgeon:  Donato Heinz, MD;  Location: [facility];  Service: General;  Laterality: N/A;  . Colonoscopy  2010    Dr. Oneida Alar:  single tubular adenoma, one hyperplastic polyp. Next TCS 2020 health permitting.    Social History:  reports that she quit smoking about 10 months ago. Her smoking use included Cigarettes. She has a 20 pack-year smoking history. She does not have any smokeless tobacco history on file. She reports that she does not drink alcohol or use illicit drugs.  Allergies  Allergen Reactions  . Aricept [Donepezil Hcl]     Hives, vomiting and headache  . Namenda [Memantine Hcl]     hives  . Codeine   . Latex   . Penicillins Rash    Family History  Problem Relation Age of Onset  . Heart failure Mother     CHF  . Cirrhosis Brother   . Lung cancer Father     brain mets  . Kidney cancer Mother   . Kidney cancer Sister      Prior to Admission medications   Medication Sig Start Date End Date Taking? Authorizing Provider  ALPRAZolam (NIRAVAM) 0.25 MG dissolvable tablet Take 0.25 mg by mouth at bedtime as needed.     Yes Historical Provider, MD  aspirin 325 MG tablet Take 325 mg by mouth daily.     Yes Historical Provider, MD  cholecalciferol (VITAMIN D) 1000 UNITS tablet Take 1,000 Units by mouth every morning.    Yes Historical Provider, MD  Coenzyme Q10 (CO Q 10) 100 MG CAPS Take 100 mg by mouth every morning.    Yes Historical Provider, MD  docusate sodium (COLACE) 100 MG capsule Take 100 mg by mouth at bedtime.    Yes Historical Provider, MD  esomeprazole (NEXIUM) 40 MG capsule Take 40 mg by mouth at bedtime.    Yes Historical Provider, MD  fish oil-omega-3 fatty acids 1000 MG capsule Take 1 g by mouth every morning.    Yes Historical Provider, MD  glyBURIDE-metformin (GLUCOVANCE) 5-500 MG per tablet Take 2 tablets by mouth 2 (two) times daily with a meal.    Yes Historical Provider, MD  insulin aspart (NOVOLOG) 100 UNIT/ML injection Inject 3 Units into the skin daily after breakfast.    Yes Historical Provider, MD  insulin glargine (LANTUS) 100 UNIT/ML injection Inject 3-8 Units into the skin  at bedtime. Give if BS 140-150= 3 units, 151-160=4 units, 161-170=5 units, 171-180= 6 units, >181=7units   Yes Historical Provider, MD  lisinopril (PRINIVIL,ZESTRIL) 20 MG tablet Take 1 tablet (20 mg total) by mouth daily. 06/27/13  Yes [name], MD  megestrol (MEGACE) 400 MG/10ML suspension Take 10 mLs (400 mg total) by mouth 2 (two) times daily. 06/27/13  Yes [name], MD  metoprolol succinate (TOPROL-XL) 50 MG 24 hr tablet Take 1 tablet (50 mg total) by mouth daily. Take with or immediately following a meal. 06/27/13  Yes [name], MD  mirtazapine (REMERON) 30 MG tablet Take 30 mg by mouth at bedtime.   Yes Historical Provider, MD  tiotropium (SPIRIVA) 18 MCG inhalation capsule Place 18 mcg into inhaler and inhale daily as needed (shortness of breath).   Yes Historical Provider, MD   Physical Exam: Filed Vitals:   02/16/14 1127 02/16/14 1300 02/16/14 1400 02/16/14 1559  BP: 127/85 176/72 176/82 190/91  Pulse: 77 77 86 84  Temp: 98 F (36.7 C)   99.4 F (37.4 C)  TempSrc:    Oral  Resp: 16 15 25  20  Height:    _0  (1.626 m)  Weight:    70 kg (154 lb 5.2 oz)  SpO2: 97% 94% 93% 95%   General: Examined in the emergency department. Appears calm and comfortable Eyes: PERRL, normal lids, irises  ENT: grossly normal hearing, lips & tongue Neck: no LAD, masses or thyromegaly Cardiovascular: RRR, no m/r/g. No LE edema. Respiratory: CTA bilaterally, no w/r/r. Normal respiratory effort. Abdomen: soft, ntnd Skin: no rash or induration seen  Musculoskeletal: grossly normal tone BUE/BLE. Moves all extremities to command, strength 4+/5 all extremities, symmetric. Psychiatric: Appears to be somewhat confused consistent with history of dementia, speech fluent and appropriate Neurologic: Cranial nerves 2-12 intact. No pronator drift. No upper extremity dysdiadochokinesis.  Wt Readings from Last 3 Encounters:  02/16/14 70 kg (154 lb 5.2 oz)  12/01/13 63.504 kg (140 lb)  08/17/13  66.407 kg (146 lb 6.4 oz)    Labs on Admission:  Basic Metabolic Panel:  Recent Labs Lab 02/16/14 1225  NA 141  K 3.7  CL 103  CO2 24  GLUCOSE 140*  BUN 14  CREATININE 0.61  CALCIUM 9.9    Liver Function Tests:  Recent Labs Lab 02/16/14 1225  AST 15  ALT 12  ALKPHOS 87  BILITOT 0.5  PROT 7.9  ALBUMIN 3.6   CBC:  Recent Labs Lab 02/16/14 1225  WBC 5.2  NEUTROABS 2.2  HGB 12.8  HCT 36.7  MCV 98.4  PLT 270    Cardiac Enzymes:  Recent Labs Lab 02/16/14 1225  [place] <0.30      Recent Labs  06/12/13 1304 06/24/13 0513  PROBNP 880.3* 768.3*    CBG:  Recent Labs Lab 02/16/14 1625  GLUCAP 99     Radiological Exams on Admission: Ct Head Wo Contrast  02/16/2014   CLINICAL DATA:  Generalized weakness  EXAM: CT HEAD WITHOUT CONTRAST  TECHNIQUE: Contiguous axial images were obtained from the base of the skull through the vertex without intravenous contrast.  COMPARISON:  06/23/2013  FINDINGS: No sign of acute infarction. There are chronic small vessel changes throughout the white matter. There is an old left frontal cortical and subcortical infarction. No mass lesion, hemorrhage, hydrocephalus or extra-axial collection. No calvarial abnormality. No inflammatory sinus disease.  IMPRESSION: No acute finding. Chronic small vessel disease. 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   Electronically Signed   By: Nelson Chimes M.D.   On: 02/16/2014 12:07    Principal Problem:   Right sided weakness Active Problems:   DIABETES MELLITUS, TYPE II, UNCONTROLLED   HYPERTENSION   Leg weakness, bilateral   UTI (lower urinary tract infection)   Assessment/Plan 1. Right-sided upper and lower extremity weakness. Unclear etiology and significance. By history, this has been intermittent in nature for the last 2 weeks. Most severe episode this morning was noted upon awakening. Patient known to have severe extracranial occlusive disease and also has been  admitted several times for generalized weakness with unrevealing workup. Additionally she has a history of orthostatic hypotension. There is also some report of intermittent generalized weakness as well. Differential would include TIA, UTI, orthostasis, as well as cognitive dysfunction and simple generalized weakness. Currently no focal deficits. 2. H/o bilateral ICA occlusions, severe extracranial occlusive disease (treated with statin and antiplatelet agent), orthostatic hypotension thought to be secondary to diabetic polyneuropathy/diabetic dysautonomia treated with support stockings and midodrine in the past (stopped as per cardiology). 3. UTI. 4. Generalized weakness without focal deficits. 5. Left eye blurred vision.  No pain. Has f/u with eye doctor 4/7. 6. DM. 7. HTN. Stable.  8. Depression, anxiety 9. COPD/centrilobular emphysema 10. Early dementia  81. Non-specific EKG. No chest pain, signs or symptoms of ACS. Repeat EKG in AM.   Admit to observation, serial neuro checks, check orthostatics, PT consult. At this point favor UTI/generalized weakness/dementia rather than acute CNS event, however, will proceed with further TIA evaluation inpatient vs. outpatient as available.  Treat UTI.  SSI  I discussed all imaging, laboratory studies with son and daughter at bedside, current impressions, diagnostic uncertainty and treatment plan.  Code Status: full   DVT prophylaxis:Lovenox Family Communication:  Disposition Plan/Anticipated LOS: 1-2 days  Time spent: 120 minutes  Murray Hodgkins, MD  [facility] Pager [phone] 02/16/2014, 5:14 PM

## 2014-02-17 DIAGNOSIS — M6281 Muscle weakness (generalized): Secondary | ICD-10-CM

## 2014-02-17 DIAGNOSIS — I369 Nonrheumatic tricuspid valve disorder, unspecified: Secondary | ICD-10-CM

## 2014-02-17 DIAGNOSIS — E1165 Type 2 diabetes mellitus with hyperglycemia: Secondary | ICD-10-CM

## 2014-02-17 DIAGNOSIS — N390 Urinary tract infection, site not specified: Secondary | ICD-10-CM

## 2014-02-17 DIAGNOSIS — IMO0001 Reserved for inherently not codable concepts without codable children: Secondary | ICD-10-CM

## 2014-02-17 LAB — GLUCOSE, CAPILLARY
Glucose-Capillary: 200 mg/dL — ABNORMAL HIGH (ref 70–99)
Glucose-Capillary: 284 mg/dL — ABNORMAL HIGH (ref 70–99)
Glucose-Capillary: 247 mg/dL — ABNORMAL HIGH (ref 70–99)

## 2014-02-17 LAB — TROPONIN I
Troponin I: <0.3 ng/mL (ref <0.30)
Troponin I: <0.3 ng/mL (ref <0.30)

## 2014-02-17 LAB — LIPID PANEL
Cholesterol: 189 mg/dL (ref 0–200)
HDL: 35 mg/dL — ABNORMAL LOW (ref >39)
LDL Cholesterol: 130 mg/dL — ABNORMAL HIGH (ref 0–99)
Total CHOL/HDL Ratio: 5.4 RATIO
Triglycerides: 122 mg/dL (ref <150)
VLDL: 24 mg/dL (ref 0–40)

## 2014-02-17 LAB — HEMOGLOBIN A1C
Hgb A1c MFr Bld: 8.3 % — ABNORMAL HIGH (ref <5.7)
Mean Plasma Glucose: 192 mg/dL — ABNORMAL HIGH (ref <117)

## 2014-02-17 MED ORDER — METOPROLOL TARTRATE 1 MG/ML IV SOLN
2.5000 mg | Freq: Once | INTRAVENOUS | Status: AC
  Administered 2014-02-17: 2.5 mg via INTRAVENOUS
  Filled 2014-02-17: qty 5

## 2014-02-17 MED ORDER — HYDRALAZINE HCL 20 MG/ML IJ SOLN
10.0000 mg | Freq: Once | INTRAMUSCULAR | Status: AC
  Administered 2014-02-18: 10 mg via INTRAVENOUS
  Filled 2014-02-17: qty 1

## 2014-02-17 NOTE — Progress Notes (Signed)
Contacted midlevel hospitalist because patient's blood pressure was elevated. Orders received. Will continue to monitor.

## 2014-02-17 NOTE — Progress Notes (Signed)
Notified MD of patient's blood pressure 188/78 and blood sugar of 320 (per family request).  Daughter at bedside stated that patient takes a sliding scale of Lantus at night.  Will follow MD's order and give Hydralazine and 5 units of Novolog.  Will continue to monitor patient.

## 2014-02-17 NOTE — Progress Notes (Signed)
PROGRESS NOTE  [name] RFX:[id number] DOB: 1931/05/01 DOA: 02/16/2014 PCP: [name], MD  Summary: 80 year old woman presents the emergency department with history of right-sided weakness when she woke up this morning approximately 8 AM. Initial evaluation in the emergency department was notable for negative CT head, unremarkable blood work, and nonfocal neurologic examination per Dr. Jeneen Rinks. UTI discovered. Referred for observation and further evaluation.  Assessment/Plan: 1. Reported right-sided upper and lower extremity weakness of unclear etiology and significance. No recurrence. Subacute occurring intermittently over the last 2 weeks. Also history of intermittent generalized weakness as well as ongoing UTI. No recurrence. Suspect functional, UTI, and generalized debility rather than TIA or stroke. Further discussion with daughter at bedside today, plan to continue observation, forego further imaging, likely discharge 4/5 with outpatient followup. 2. H/o bilateral ICA occlusions, severe extracranial occlusive disease (treated with statin and antiplatelet agent), orthostatic hypotension thought to be secondary to diabetic polyneuropathy/diabetic dysautonomia treated with support stockings and midodrine in the past (stopped as per cardiology).  3. UTI. 4. Diabetes mellitus. Stable. 5. Hypertension. Stable. 6. Early dementia. 7. Nonspecific EKG changes. No chest pain, signs or symptoms of ACS. Repeat EKG today sinus rhythm, lateral T wave inversion consider lateral ischemia. No significant change compared to yesterday's study. Troponins negative. No further evaluation at this point. Telemetry shows sinus rhythm.   Monitor on telemetry overnight. No recurrent neurologic symptoms, anticipate discharge home 4/5 with outpatient followup.  Continue treatment for UTI.  Discussed above with daughter at bedside.  Pending studies:   Hemoglobin A1c  Code Status: full code DVT prophylaxis:  Lovenox Family Communication:  Disposition Plan:   Murray Hodgkins, MD  [facility]  Pager [phone] If 7PM-7AM, please contact night-coverage at [email or web address], password [facility] 02/17/2014, 5:19 PM  LOS: 1 day   Consultants:    Procedures:    Antibiotics:  Levaquin 4/3 >>   HPI/Subjective: Feeling better today. Ambulated to bathroom with assistance without difficulty. No recurrent right-sided symptoms. No difficulty speaking or swallowing.  Objective: Filed Vitals:   02/17/14 0415 02/17/14 0613 02/17/14 0945 02/17/14 1528  BP: 188/77 186/92 154/84 150/74  Pulse: 77 73 80 72  Temp: 98.6 F (37 C) 98.6 F (37 C)  98.8 F (37.1 C)  TempSrc: Oral Oral  Oral  Resp: _0 Height: _1  (1.626 m)     Weight: 69.5 kg (153 lb 3.5 oz) 70.7 kg (155 lb 13.8 oz)    SpO2: 95% 94% 92% 95%    Intake/Output Summary (Last 24 hours) at 02/17/14 1719 Last data filed at 02/17/14 1700  Gross per 24 hour  Intake    120 ml  Output    501 ml  Net   -381 ml     Filed Weights   02/16/14 1559 02/17/14 0415 02/17/14 0613  Weight: 70 kg (154 lb 5.2 oz) 69.5 kg (153 lb 3.5 oz) 70.7 kg (155 lb 13.8 oz)    Exam:   Afebrile, vital signs are stable. No hypoxia.  Gen. Appears calm and comfortable. Speech fluent and clear.  Cardiovascular regular rate and rhythm. No murmur, rub or gallop. No lower extremity edema.  Respiratory clear to auscultation bilaterally. No wheezes, rales or rhonchi. Normal respiratory effort.  Abdomen soft nontender nondistended.  Neurologic. Cranial nerves 2-12 intact. No upper extremity dysdiadochokinesis. No pronator drift.  Musculoskeletal. Tone and strength all extremities symmetric, 4+/5.  Psychiatric very grossly normal mood and affect. Speech fluent and appropriate.  Data Reviewed:  Capillary  blood sugars stable.  troponins negative  LDL 130  Scheduled Meds: . aspirin  325 mg Oral Daily  . docusate sodium  100 mg Oral QHS  .  enoxaparin (LOVENOX) injection  40 mg Subcutaneous Q24H  . insulin aspart  0-9 Units Subcutaneous TID WC  . levofloxacin  250 mg Oral Daily  . megestrol  400 mg Oral BID  . metoprolol succinate  50 mg Oral Daily  . mirtazapine  30 mg Oral QHS  . pantoprazole  80 mg Oral Daily   Continuous Infusions:   Principal Problem:   Right sided weakness Active Problems:   DIABETES MELLITUS, TYPE II, UNCONTROLLED   HYPERTENSION   Leg weakness, bilateral   UTI (lower urinary tract infection)   Time spent 20 minutes

## 2014-02-17 NOTE — Progress Notes (Signed)
Patient's blood pressure continues to be elevated. Contacted MD on call. Orders received. Will continue to monitor.

## 2014-02-18 DIAGNOSIS — I10 Essential (primary) hypertension: Secondary | ICD-10-CM | POA: Diagnosis present

## 2014-02-18 DIAGNOSIS — R29898 Other symptoms and signs involving the musculoskeletal system: Secondary | ICD-10-CM

## 2014-02-18 LAB — GLUCOSE, CAPILLARY
Glucose-Capillary: 320 mg/dL — ABNORMAL HIGH (ref 70–99)
Glucose-Capillary: 323 mg/dL — ABNORMAL HIGH (ref 70–99)
Glucose-Capillary: 257 mg/dL — ABNORMAL HIGH (ref 70–99)
Glucose-Capillary: 177 mg/dL — ABNORMAL HIGH (ref 70–99)
Glucose-Capillary: 331 mg/dL — ABNORMAL HIGH (ref 70–99)

## 2014-02-18 LAB — PRO B NATRIURETIC PEPTIDE: Pro B Natriuretic peptide (BNP): 285.7 pg/mL (ref 0–450)

## 2014-02-18 MED ORDER — METOPROLOL TARTRATE 1 MG/ML IV SOLN
5.0000 mg | Freq: Four times a day (QID) | INTRAVENOUS | Status: DC
  Administered 2014-02-18: 5 mg via INTRAVENOUS
  Filled 2014-02-18: qty 5

## 2014-02-18 MED ORDER — LISINOPRIL 10 MG PO TABS
20.0000 mg | ORAL_TABLET | Freq: Every day | ORAL | Status: DC
  Administered 2014-02-18 – 2014-02-20 (×3): 20 mg via ORAL
  Filled 2014-02-18 (×3): qty 2

## 2014-02-18 MED ORDER — FUROSEMIDE 10 MG/ML IJ SOLN
20.0000 mg | Freq: Once | INTRAMUSCULAR | Status: AC
  Administered 2014-02-18: 20 mg via INTRAVENOUS
  Filled 2014-02-18: qty 2

## 2014-02-18 MED ORDER — INSULIN ASPART 100 UNIT/ML ~~LOC~~ SOLN
0.0000 [IU] | Freq: Three times a day (TID) | SUBCUTANEOUS | Status: DC
  Administered 2014-02-18: 5 [IU] via SUBCUTANEOUS
  Administered 2014-02-18: 2 [IU] via SUBCUTANEOUS
  Administered 2014-02-19: 5 [IU] via SUBCUTANEOUS
  Administered 2014-02-19: 9 [IU] via SUBCUTANEOUS
  Administered 2014-02-19: 3 [IU] via SUBCUTANEOUS

## 2014-02-18 MED ORDER — INSULIN ASPART 100 UNIT/ML ~~LOC~~ SOLN
3.0000 [IU] | Freq: Every day | SUBCUTANEOUS | Status: DC
  Administered 2014-02-18 – 2014-02-19 (×3): 3 [IU] via SUBCUTANEOUS

## 2014-02-18 MED ORDER — CLOPIDOGREL BISULFATE 75 MG PO TABS
75.0000 mg | ORAL_TABLET | Freq: Every day | ORAL | Status: DC
  Administered 2014-02-19 – 2014-02-20 (×2): 75 mg via ORAL
  Filled 2014-02-18 (×2): qty 1

## 2014-02-18 MED ORDER — SIMVASTATIN 20 MG PO TABS
40.0000 mg | ORAL_TABLET | Freq: Every day | ORAL | Status: DC
  Administered 2014-02-18 – 2014-02-19 (×2): 40 mg via ORAL
  Filled 2014-02-18 (×2): qty 2

## 2014-02-18 MED ORDER — INSULIN ASPART 100 UNIT/ML ~~LOC~~ SOLN
5.0000 [IU] | Freq: Once | SUBCUTANEOUS | Status: AC
  Administered 2014-02-18: 5 [IU] via SUBCUTANEOUS

## 2014-02-18 MED ORDER — INSULIN GLARGINE 100 UNIT/ML ~~LOC~~ SOLN
3.0000 [IU] | Freq: Every day | SUBCUTANEOUS | Status: DC
  Administered 2014-02-18 – 2014-02-19 (×2): 7 [IU] via SUBCUTANEOUS
  Filled 2014-02-18 (×2): qty 0.08

## 2014-02-18 NOTE — Progress Notes (Signed)
Notified MD of patient's elevated blood pressure.  Received orders.

## 2014-02-18 NOTE — Progress Notes (Signed)
Nurse was called to room when patient's daughter was awoken by patient.  Daughter stated that patient was throwing up and had full body tremors.  Nurse arrived to room immediately and patient was no longer having tremors but was throwing up a small amount of thin, green fluid.  Patient's heart rate was increased to 150's during this event.  Vital signs were obtained and blood pressure was elevated.  Notified MD and received orders to give 5 mg of Lopressor and to continue neuro checks.  Will continue to monitor patient.

## 2014-02-18 NOTE — Progress Notes (Signed)
PROGRESS NOTE  [name] PFX:[id number] DOB: Oct 23, 1931 DOA: 02/16/2014 PCP: Michele Gravel, MD  Summary: 80 year old woman presents the emergency department with history of right-sided weakness when she woke up this morning approximately 8 AM. Initial evaluation in the emergency department was notable for negative CT head, unremarkable blood work, and nonfocal neurologic examination per Dr. Jeneen Rinks. UTI discovered. Referred for observation and further evaluation.  Assessment/Plan: 1. Reported right-sided upper and lower extremity weakness of unclear etiology and significance. MRI/MRA without change. She had no focal deficits last night, suspect vasovagal/orthostatic phenomenon based on previous history complicated by dementia and UTI. TIA in differential though less likely, also possibly related to accelerated HTN. I doubt seizure by history and has had 2 negative EEG in past. 2. H/o bilateral ICA occlusions, severe extracranial occlusive disease (treated with statin and antiplatelet agent), orthostatic hypotension thought to be secondary to diabetic polyneuropathy/diabetic dysautonomia treated with support stockings and midodrine in the past (stopped as per cardiology). Stable by MRI/MRA. 3. UTI. 4. Diabetes mellitus. Blood sugars are elevated.  5. Accelerated Hypertension. Poor control. Reviewed daughter's logbook, systolic blood pressure over the last year has ranged predominantly 160-180s, up to 200s that time. 6. Early dementia. Stable. 7. Nonspecific EKG changes. No chest pain, signs or symptoms of ACS. Troponins negative. Likely related to LVH. 8. Nodular opacity left upper lobe. Advise correlation with noncontrast enhanced chest CT to further evaluate as an outpatient.   Resume ACE-I. Continue BB.  PT consult, neurology consult in AM for further recs, continue monitoring today.  Add statin, d/c ASA and start Plavix. Start statin.  Resume Lantus per sliding scale at home 140-150 3  units, 151-160 4 units, 161-170 5 units, 171-180 6 units, >180 7 units.   Resume Novolog meal coverage 3 units with dinner as at home  [place] today.  Discussed in detail with daughter at bedside. If no further issues anticipate discharge home tomorrow afternoon.  Code Status: full code DVT prophylaxis: Lovenox Family Communication:  Disposition Plan:   Murray Hodgkins, MD  [facility]  Pager [phone] If 7PM-7AM, please contact night-coverage at [email or web address], password [facility] 02/18/2014, 10:22 AM  LOS: 2 days   Consultants:    Procedures:  2-D echocardiogram. LVEF 65-70%. Normal wall motion. Grade 1 diastolic dysfunction. Severe LVH.  Antibiotics:  Levaquin 4/3 >> 4/5  HPI/Subjective: Had recurrent spells last night. First while on commode, developed slump to right and generalized tremors and daze but no focal deficits, was assisted back to bed. Had two more episodes of dazed look and tremors last a few minutes each time. No focal deficits per documentation, as well as per daughter who was present. Also with several episodes of spitting up.   Objective: Filed Vitals:   02/17/14 2341 02/18/14 0100 02/18/14 0439 02/18/14 0630  BP: 188/78 180/71 212/83 205/82  Pulse:   117 91  Temp:   97.4 F (36.3 C)   TempSrc:   Oral   Resp:      Height:      Weight:      SpO2:   100% 96%    Intake/Output Summary (Last 24 hours) at 02/18/14 1022 Last data filed at 02/17/14 1804  Gross per 24 hour  Intake    240 ml  Output      1 ml  Net    239 ml     Filed Weights   02/16/14 1559 02/17/14 0415 02/17/14 0613  Weight: 70 kg (154 lb 5.2 oz) 69.5 kg (153  lb 3.5 oz) 70.7 kg (155 lb 13.8 oz)    Exam:   Afebrile, vital signs are stable but hypertensive. No hypoxia.  Gen. Appears calm and comfortable. Speech fluent and clear.  Cardiovascular RR tachycardic. No murmur, rub or gallop. No lower extremity edema.  Telemetry ST  Respiratory CTAB. No wheezes, rales  or rhonchi. Normal respiratory effort.  Abdomen soft nontender nondistended.  Neurologic. Cranial nerves 2-12 remain intact. Again, no upper extremity dysdiadochokinesis or pronator drift.  Musculoskeletal. Tone and strength all extremities remains symmetric, 4+/5. Follows commands well.   Psychiatric grossly normal mood and affect. Mildly confused. Speech fluent and appropriate.  Data Reviewed:  Capillary blood sugars 200-300s.  MRI no acute stroke. MRA with severe cerebrovascular disease without significant progression.  EKG sinus tachycardia. Bilateral atrial enlargement. T wave inversion lateral leads has resolved.  Scheduled Meds: . aspirin  325 mg Oral Daily  . docusate sodium  100 mg Oral QHS  . enoxaparin (LOVENOX) injection  40 mg Subcutaneous Q24H  . insulin aspart  0-9 Units Subcutaneous TID WC  . levofloxacin  250 mg Oral Daily  . megestrol  400 mg Oral BID  . metoprolol  5 mg Intravenous 4 times per day  . metoprolol succinate  50 mg Oral Daily  . mirtazapine  30 mg Oral QHS  . pantoprazole  80 mg Oral Daily   Continuous Infusions:   Principal Problem:   Right sided weakness Active Problems:   DIABETES MELLITUS, TYPE II, UNCONTROLLED   HYPERTENSION   Leg weakness, bilateral   UTI (lower urinary tract infection)   Time spent 25 minutes

## 2014-02-18 NOTE — Progress Notes (Addendum)
Patient was assisted to bedside commode by nurse tech.  Per patient's daughter at bedside, once on the [facility] patient became very weak to the right side and began leaning to the right side.  Patient's daughter stated that the patient's legs and arms began to shake and patient became frustrated and appeared distant.  Notified mid-level and received orders to continue to monitor patient's vitals and neuro checks.

## 2014-02-18 NOTE — Progress Notes (Signed)
Assisted patient to the bedside commode and once on bedside commode, patient began leaning to right side, began spitting up a small amount of clear fluid, and had mild tremors in arms and legs.  Patient was responsive during this time but did appear agitated and distant.  Patient was able to respond to questions and was alert and oriented.  MD notified.

## 2014-02-18 NOTE — Progress Notes (Signed)
Pt. Has vomited 3 times throughout night.  Each time has been a small amount and has been green.  Will continue to monitor patient.

## 2014-02-18 NOTE — Evaluation (Addendum)
Physical Therapy Evaluation Patient Details Name: [name] MRN: [id number] DOB: [birth year] Today's Date: 02/18/2014   History of Present Illness  80 year old woman presents the emergency department with history of right-sided weakness when she woke up this morning approximately 8 AM. Initial evaluation in the emergency department was notable for negative CT head, unremarkable blood work,UTI discovered.Patient has early dementia and has limited ability to provide history. In addition history was obtained from daughter and son at bedside with whom the patient lives. Patient has generally been declining since 03/2013 at which time she underwent appendectomy for acute appendicitis. She had several hospitalizations thereafter, including 2 for generalized weakness in July 2014 at which time stroke evaluation was undertaken, carotid ultrasounds revealed bilateral ICA occlusion, MRI of the brain suggested chronic microvascular ischemia, MRI of the brain demonstrated widely patent posterior circulation. Neurology saw the patient that time, recommended statins and antiplatelet therapy for severe extracranial occlusive disease.  Clinical Impression  Patient alert, oriented with person , disoriented with time and place. Daughter and son present during therapy session , as per daughter patient stopped ambulation since last nov 2014 due to multiple hospital admits , was currently on  Home health Therapy where the Therapist was able to ambulate patient upto 10 steps and needed to sit down immediately due to weakness and lack of endurance , daughter stated patient was feeling weak today sec to decreased intake since last night, patient demonstrated increased time for processing the instructions, attempted to perform standing with min A , was not able to take steps d/t  to weakness so no gait training performed today,performed transfer from the bed to bed side commode, patient was able to stand up to 2 min with use of  rolling walker and min A x 2, noted tremors in LE which as per daughter was same as last night, nursing aware of , plan is to dc home with Home health services as per daughter and they will be able to provide 24/7 assistance as per required.Recommended Short term rehab to the family, son stated they will look upon it and decide after discussing with daughter.   Follow Up Recommendations Home health PT    Equipment Recommendations  Wheelchair cushion (measurements PT);Wheelchair (measurements PT)    Recommendations for Other Services Rehab consult     Precautions / Restrictions        Mobility  Bed Mobility Overal bed mobility: Needs Assistance Bed Mobility: Supine to Sit     Supine to sit: Mod assist        Transfers Overall transfer level: Needs assistance Equipment used: Rolling walker (2 wheeled) Transfers: Sit to/from [facility] Sit to Stand: Min assist Stand pivot transfers: Mod assist          Ambulation/Gait             General Gait Details: patient able to take 2 side steps but difficulty with lifting RLE forwards for stepping.  Stairs            Wheelchair Mobility    Modified Rankin (Stroke Patients Only)       Balance Overall balance assessment: Needs assistance Sitting-balance support: Bilateral upper extremity supported Sitting balance-Leahy Scale: Fair   Postural control: Posterior lean Standing balance support: Bilateral upper extremity supported Standing balance-Leahy Scale: Poor                               Pertinent Vitals/Pain  Home Living Family/patient expects to be discharged to:: Private residence   Available Help at Discharge: Family Type of Home: House Home Access: [place]: [place]: Clinical cytogeneticist - 2 wheels Additional Comments: patient has been using the walker for ambulation until last nov 2014    Prior Function Level of  [place]ependence: Needs assistance   Gait / Transfers Assistance Needed: as per daughter patient  required mod A with all functional transfers since nov 2014 , was non ambulatory and more wc bounded           Hand Dominance   Dominant Hand: Right    Extremity/Trunk Assessment   Upper Extremity Assessment: Defer to OT evaluation           Lower Extremity Assessment: RLE deficits/detail RLE Deficits / Details: R knee extensors and flexors to be 3/5 and hip flexors/abductors 3-/5, LLE to be 3+/5 grossly       Communication   Communication: No difficulties (patient prefers to be talked at soft and low voice.)  Cognition Arousal/Alertness: Lethargic Behavior During Therapy: Restless Overall Cognitive Status: Impaired/Different from baseline Area of Impairment: Orientation;Safety/judgement;Awareness Orientation Level: Place;Time;Situation       Safety/Judgement: Decreased awareness of safety          General Comments      Exercises Total Joint Exercises Ankle Circles/Pumps: AROM;Both;5 reps;Supine Heel Slides: AROM;Both;10 reps;Supine Hip ABduction/ADduction: AROM;Both;5 reps;Supine Straight Leg Raises: AROM;Both;5 reps;Supine Knee Flexion: AROM;Both;5 reps      Assessment/Plan    PT Assessment Patient needs continued PT services  PT Diagnosis Difficulty walking;Generalized weakness   PT Problem List Decreased strength;Decreased safety awareness;Decreased knowledge of use of DME;Decreased activity tolerance;Decreased balance;Decreased mobility  PT Treatment Interventions DME instruction;Therapeutic activities;Therapeutic exercise;Balance training;Functional mobility training;Gait training   PT Goals (Current goals can be found in the Care Plan section) Acute Rehab PT Goals Patient Stated Goal: to be able to go home, able to perform transfers with increased RLE strength and decreased level of A for functional activites with ability to walk with walker. PT Goal  Formulation: With patient/family Time For Goal Achievement: 03/04/14 Potential to Achieve Goals: Good    Frequency Min 5X/week   Barriers to discharge   as per daughter patient will have 24 hr care/assistance from son and daughter    Co-evaluation               End of Session Equipment Utilized During Treatment: Gait belt Activity Tolerance: Patient limited by fatigue Patient left: in bed;with call bell/phone within reach;with family/visitor present Nurse Communication: Mobility status         Time: 8115-7262 PT Time Calculation (min): 60 min   Charges:   PT Evaluation $Initial PT Evaluation Tier I: 1 Procedure PT Treatments $Therapeutic Exercise: 8-22 mins $Therapeutic Activity: 8-22 mins $Neuromuscular Re-education: 8-22 mins   PT G CodesDarius Bump 2014-03-02, 12:26 PM

## 2014-02-19 LAB — URINE CULTURE
Colony Count: NO GROWTH
Culture: NO GROWTH

## 2014-02-19 LAB — GLUCOSE, CAPILLARY
Glucose-Capillary: 246 mg/dL — ABNORMAL HIGH (ref 70–99)
Glucose-Capillary: 470 mg/dL — ABNORMAL HIGH (ref 70–99)
Glucose-Capillary: 376 mg/dL — ABNORMAL HIGH (ref 70–99)
Glucose-Capillary: 281 mg/dL — ABNORMAL HIGH (ref 70–99)
Glucose-Capillary: 295 mg/dL — ABNORMAL HIGH (ref 70–99)

## 2014-02-19 LAB — HOMOCYSTEINE: Homocysteine: 7.4 umol/L (ref 4.0–15.4)

## 2014-02-19 LAB — RPR: RPR Ser Ql: NONREACTIVE

## 2014-02-19 LAB — TSH: TSH: 1.19 u[IU]/mL (ref 0.350–4.500)

## 2014-02-19 LAB — VITAMIN B12: Vitamin B-12: 1982 pg/mL — ABNORMAL HIGH (ref 211–911)

## 2014-02-19 MED ORDER — INSULIN ASPART 100 UNIT/ML ~~LOC~~ SOLN
0.0000 [IU] | Freq: Three times a day (TID) | SUBCUTANEOUS | Status: DC
  Administered 2014-02-20: 5 [IU] via SUBCUTANEOUS
  Administered 2014-02-20: 11 [IU] via SUBCUTANEOUS

## 2014-02-19 MED ORDER — INSULIN ASPART 100 UNIT/ML ~~LOC~~ SOLN
0.0000 [IU] | Freq: Every day | SUBCUTANEOUS | Status: DC
Start: 2014-02-19 — End: 2014-02-20
  Administered 2014-02-19: 3 [IU] via SUBCUTANEOUS

## 2014-02-19 NOTE — Clinical Social Work Psychosocial (Signed)
Clinical Social Work Department BRIEF PSYCHOSOCIAL ASSESSMENT 02/19/2014  Patient:  [name], [name]     Account Number:  [id number]     Admit date:  02/16/2014  Clinical Social Worker:  Wyatt Haste  Date/Time:  02/19/2014 12:48 PM  Referred by:  CSW  Date Referred:  02/19/2014 Referred for  SNF Placement   Other Referral:   Interview type:  Patient Other interview type:   daughter- [name]    PSYCHOSOCIAL DATA Living Status:  FAMILY Admitted from facility:   Level of care:   Primary support name:  [name] Primary support relationship to patient:  CHILD, ADULT Degree of support available:   supportive    CURRENT CONCERNS Current Concerns  Post-Acute Placement   Other Concerns:    SOCIAL WORK ASSESSMENT / PLAN CSW met with pt and pt's daughter [name] at bedside. Pt well known to CSW from previous admissions. Pt alert and oriented. Lives with [name] and a son. Pt has 4 children. [name] is primarily caregiver and very involved. She has around the clock care between her children. [name] reports that pt was leaning a lot when they tried to sit her up on Friday. They were able to get her in the car and came to ED. Pt has been wheelchair bound since November of last year. She transfers with assist. About two weeks ago, [facility] OT, ST, PT. She has all equipment needed already at home. PT assessed pt yesterday and recommendation was to consider SNF vs home health. CSW discussed these options with pt and daughter. Per MD, pt will most likely be stable for d/c tomorrow. Daughter aware that she will not have qualifying 3 day stay if this is the case. They had already discussed plan to go home with care manager. Daughter states they are definitely not interested if pt would have to pay privately. [name]'s concern is about keeping pt hydrated as she does not take much fluids per daughter.   Assessment/plan status:  Referral to [employer] Other assessment/ plan:    Information/referral to community resources:   CM for home health    PATIENT'S/FAMILY'S RESPONSE TO PLAN OF CARE: Pt and daughter request for pt to go home and resume home health. Pt's daughter is very involved and even sleeps in the same room as pt in case she needs anything. CSW will sign off, but can be reconsulted if needed.       [name], [place]

## 2014-02-19 NOTE — Consult Note (Signed)
Harrison A. Merlene Laughter, MD     [email or web address]          [name] is an 80 y.o. female.   ASSESSMENT/PLAN: 1. Acute on chronic gait impairment. The etiology is unclear at this time but there appears to be significant apraxia which suggests the possibility of hydrocephalus although this is not documented on imaging. The etiology could be due to significant chronic ischemic white matter changes. Orthostatic hypotension is also an issue as she has had this documented previously. Consequently, the patient will be checked for orthostasis which could explain the problem as she has good strength in the legs. Imaging does not suggest an acute infarct.  2. Vascular dementia. Dementia labs will be obtained.  3. Severe intracranial and extracranial occlusive disease. The patient should be maintained on antiplatelet agent and statin medication. Blood pressure and diabetes control also suggested. Given the patient's comorbidities, I do not believe that she is a candidate for carotid endarterectomy on the left side. The right side is already occluded.  This is an 80 year old black female who presents with progressively worsening gait impairment over last several months. She also has had concurrently worsening cognitive impairment and in fact has been diagnosed with dementia. The cognitive impairment may have started several months after the progressive gait impairment. The history is obtained from her daughter. The patient is an unreliable historian given the cognitive impairment. Again, it appears that the gait impairment has been progressive over the last several months. It probably got subacutely to acutely worse over last several days and hence she was admitted to the hospital for further evaluation. The daughter does tell me however that she has clear fluctuations in gait impairment. Sometimes she can ambulate fairly well with the help of a walker and other times she just cannot  ambulate on all. The patient was seen here about a year ago for acute cognitive impairment. The workup was mostly unremarkable for primary neurological problem and she was probably ill due to the Medical comorbidities she was having at that time. She however was diagnosed with severe intracranial and extracranial carotid disease. Medical management was recommended at that time. The daughter reports that the patient requires help in all her ADLs because of cognitive impairment and overall downturn in physical abilities including gait impairment.   GENERAL: This is a pleasant thin lady in no acute distress.  HEENT: Supple. Atraumatic normocephalic.   ABDOMEN: soft  EXTREMITIES: No edema. Moderate arthritic changes of the knees.   BACK: Normal.  SKIN: Normal by inspection.    MENTAL STATUS: The patient is awake and alert. She is oriented to person and hospital although she thinks she is at [facility] in [place]. She is not oriented to time. She follows commands well. There is no dysarthria.   CRANIAL NERVES: Pupils are equal, round and reactive to light and accommodation; extra ocular movements are full, there is no significant nystagmus; visual fields are full; upper and lower facial muscles are normal in strength and symmetric, there is no flattening of the nasolabial folds; tongue is midline; uvula is midline; shoulder elevation is normal.  MOTOR: Normal tone, bulk and strength; no pronator drift.  COORDINATION: Left finger to nose is normal, right finger to nose is normal, No rest tremor; no intention tremor; no postural tremor; no bradykinesia.  REFLEXES: Deep tendon reflexes are symmetrical and normal. Babinski reflexes are flexor bilaterally.   SENSATION: Normal to light touch.  GAIT:The patient has significant difficulty standing  and requires assistance to stand. She has severe start hesitation with a magnetic gait and the was unable to take any steps. She does have some  retropulsions.   The brain MRI is reviewed in person. There is moderate confluent leukoencephalopathy. Nothing acute is seen. The patient does have evidence of ventriculomegaly mild but not out of proportion to the associated global moderate atrophy.    Past Medical History  Diagnosis Date  . Hemorrhage of gastrointestinal tract, unspecified   . Other malaise and fatigue   . Tobacco use disorder   . Parotid tumor   . Neoplasm of malignant     breast  ,HX   . Diabetes mellitus   . Osteopenia   . Hypertension   . Hyperlipidemia   . Depression   . Anxiety state, unspecified   . Chronic headaches   . Carotid artery disease   . Orthostatic hypotension     Past Surgical History  Procedure Laterality Date  . Mastectomy  1994    left breast -related to breast cancer  . Vesicovaginal fistula closure w/ tah  1979  . Cholecystectomy  2008  . Hemorrhoid surgery  1960s  . Laparoscopic appendectomy N/A 04/13/2013    Procedure: APPENDECTOMY LAPAROSCOPIC;  Surgeon: Donato Heinz, MD;  Location: [facility];  Service: General;  Laterality: N/A;  . Colonoscopy  2010    Dr. Oneida Alar: single tubular adenoma, one hyperplastic polyp. Next TCS 2020 health permitting.    Family History  Problem Relation Age of Onset  . Heart failure Mother     CHF  . Cirrhosis Brother   . Lung cancer Father     brain mets  . Kidney cancer Mother   . Kidney cancer Sister     Social History:  reports that she quit smoking about 10 months ago. Her smoking use included Cigarettes. She has a 20 pack-year smoking history. She does not have any smokeless tobacco history on file. She reports that she does not drink alcohol or use illicit drugs.  Allergies:  Allergies  Allergen Reactions  . Aricept [Donepezil Hcl]     Hives, vomiting and headache  . Namenda [Memantine Hcl]     hives  . Codeine   . Latex   . Penicillins Rash    Medications: Prior to Admission medications   Medication Sig Start Date End Date  Taking? Authorizing Provider  ALPRAZolam (NIRAVAM) 0.25 MG dissolvable tablet Take 0.25 mg by mouth at bedtime as needed.     Yes Historical Provider, MD  aspirin 325 MG tablet Take 325 mg by mouth daily.     Yes Historical Provider, MD  cholecalciferol (VITAMIN D) 1000 UNITS tablet Take 1,000 Units by mouth every morning.    Yes Historical Provider, MD  Coenzyme Q10 (CO Q 10) 100 MG CAPS Take 100 mg by mouth every morning.    Yes Historical Provider, MD  docusate sodium (COLACE) 100 MG capsule Take 100 mg by mouth at bedtime.    Yes Historical Provider, MD  esomeprazole (NEXIUM) 40 MG capsule Take 40 mg by mouth at bedtime.    Yes Historical Provider, MD  fish oil-omega-3 fatty acids 1000 MG capsule Take 1 g by mouth every morning.    Yes Historical Provider, MD  glyBURIDE-metformin (GLUCOVANCE) 5-500 MG per tablet Take 2 tablets by mouth 2 (two) times daily with a meal.    Yes Historical Provider, MD  insulin aspart (NOVOLOG) 100 UNIT/ML injection Inject 3 Units into the skin daily after  breakfast.    Yes Historical Provider, MD  insulin glargine (LANTUS) 100 UNIT/ML injection Inject 3-8 Units into the skin at bedtime. Give if BS 140-150= 3 units, 151-160=4 units, 161-170=5 units, 171-180= 6 units, >181=7units   Yes Historical Provider, MD  lisinopril (PRINIVIL,ZESTRIL) 20 MG tablet Take 1 tablet (20 mg total) by mouth daily. 06/27/13  Yes [name], MD  megestrol (MEGACE) 400 MG/10ML suspension Take 10 mLs (400 mg total) by mouth 2 (two) times daily. 06/27/13  Yes [name], MD  metoprolol succinate (TOPROL-XL) 50 MG 24 hr tablet Take 1 tablet (50 mg total) by mouth daily. Take with or immediately following a meal. 06/27/13  Yes [name], MD  mirtazapine (REMERON) 30 MG tablet Take 30 mg by mouth at bedtime.   Yes Historical Provider, MD  tiotropium (SPIRIVA) 18 MCG inhalation capsule Place 18 mcg into inhaler and inhale daily as needed (shortness of breath).   Yes Historical Provider, MD     Scheduled Meds: . clopidogrel  75 mg Oral Q breakfast  . docusate sodium  100 mg Oral QHS  . enoxaparin (LOVENOX) injection  40 mg Subcutaneous Q24H  . insulin aspart  0-9 Units Subcutaneous TID WC  . insulin aspart  3 Units Subcutaneous Q supper  . insulin glargine  3-8 Units Subcutaneous QHS  . levofloxacin  250 mg Oral Daily  . lisinopril  20 mg Oral Daily  . megestrol  400 mg Oral BID  . metoprolol succinate  50 mg Oral Daily  . mirtazapine  30 mg Oral QHS  . pantoprazole  80 mg Oral Daily  . simvastatin  40 mg Oral q1800   Continuous Infusions:  PRN Meds:.tiotropium   Blood pressure 133/49, pulse 77, temperature 97.6 F (36.4 C), temperature source Oral, resp. rate 20, height _0  (1.626 m), weight 70.7 kg (155 lb 13.8 oz), SpO2 98.00%.   Results for orders placed during the hospital encounter of 02/16/14 (from the past 48 hour(s))  GLUCOSE, CAPILLARY     Status: Abnormal   Collection Time    02/17/14 11:24 AM      Result Value Ref Range   Glucose-Capillary 284 (*) 70 - 99 mg/dL   Comment 1 Notify RN    GLUCOSE, CAPILLARY     Status: Abnormal   Collection Time    02/17/14  4:25 PM      Result Value Ref Range   Glucose-Capillary 247 (*) 70 - 99 mg/dL   Comment 1 Notify RN    GLUCOSE, CAPILLARY     Status: Abnormal   Collection Time    02/17/14  9:56 PM      Result Value Ref Range   Glucose-Capillary 320 (*) 70 - 99 mg/dL   Comment 1 Notify RN    PRO B NATRIURETIC PEPTIDE     Status: None   Collection Time    02/18/14  7:24 AM      Result Value Ref Range   Pro B Natriuretic peptide (BNP) 285.7  0 - 450 pg/mL  GLUCOSE, CAPILLARY     Status: Abnormal   Collection Time    02/18/14  8:29 AM      Result Value Ref Range   Glucose-Capillary 323 (*) 70 - 99 mg/dL  GLUCOSE, CAPILLARY     Status: Abnormal   Collection Time    02/18/14 12:04 PM      Result Value Ref Range   Glucose-Capillary 257 (*) 70 - 99 mg/dL   Comment 1 Notify  RN    GLUCOSE, CAPILLARY      Status: Abnormal   Collection Time    02/18/14  4:23 PM      Result Value Ref Range   Glucose-Capillary 177 (*) 70 - 99 mg/dL   Comment 1 Notify RN    GLUCOSE, CAPILLARY     Status: Abnormal   Collection Time    02/18/14  8:21 PM      Result Value Ref Range   Glucose-Capillary 331 (*) 70 - 99 mg/dL  GLUCOSE, CAPILLARY     Status: Abnormal   Collection Time    02/19/14  7:26 AM      Result Value Ref Range   Glucose-Capillary 246 (*) 70 - 99 mg/dL   Comment 1 Notify RN      No results found.  BRAIN MRA  Basilar artery of remains widely patent at dolichoectatic.  Vertebrals are codominant.  Hypertrophied right posterior communicating artery is the  predominant contribution the right anterior circulation. The ICA  terminus is reconstituted, with widely patent M1 MCA. There is  severe disease at the origin of the right A1 ACA, likely 75-90%  stenosis. Both anterior cerebrals in their A2 segments are  diminutive.  The left internal carotid artery is patent but significantly  diminished in caliber, suggesting a proximal ICA stenosis. Left  ophthalmic artery can be seen, potentially a source of collateral  flow. There is faint visualization of the carotid siphon, with more  robust appearance of the left ICA terminus. The M1 segment of the  left middle cerebral artery is patent but small. Severe disease of a  proximal M2 left MCA segment. I am not able to clearly see the left  A1 anterior cerebral artery.  Both posterior cerebral arteries are widely patent. There is no  cerebellar branch occlusion observed.  IMPRESSION:  Severe cerebrovascular disease, but without significant progression  from July 2014. The right ICA remains occluded, there is severe  diffuse disease of the left ICA, but the posterior circulation is  robust. See discussion above.      Sahian Kerney A. Merlene Laughter, M.D.  Diplomate, Tax adviser of Psychiatry and Neurology ( Neurology). 02/19/2014, 8:45 AM

## 2014-02-19 NOTE — Progress Notes (Signed)
Nurse was called to the room because the patient reported she was itching.  Upon assessment, pt. Stated her arms were itching and nurse applied non-medicated lotion and itching resolved.  Pt. Was alert and oriented to self and situation but not oriented to time or place.  Daughter at bedside stated that patient seems to be more confused at night.  Will continue to monitor patient.

## 2014-02-19 NOTE — Progress Notes (Signed)
Inpatient Diabetes Program Recommendations  AACE/ADA: New Consensus Statement on Inpatient Glycemic Control (2013)  Target Ranges:  Prepandial:   less than 140 mg/dL      Peak postprandial:   less than 180 mg/dL (1-2 hours)      Critically ill patients:  140 - 180 mg/dL   Results for MIMI, DEBELLIS (MRN [id number]) as of 02/19/2014 08:18  Ref. Range 02/18/2014 08:29 02/18/2014 12:04 02/18/2014 16:23 02/18/2014 20:21 02/19/2014 07:26  Glucose-Capillary Latest Range: 70-99 mg/dL 323 (H) 257 (H) 177 (H) 331 (H) 246 (H)   Diabetes history: DM2 Outpatient Diabetes medications: Lantus 3-8 units QHS (depending on CBG), Glucovance 5-500 mg BID, Novolog 3 units TID with meals Current orders for Inpatient glycemic control:  Lantus 3-8 units QHS (depending on CBG), Novolog 0-9 units AC, Novolog 3 units TID with meals  Inpatient Diabetes Program Recommendations Correction (SSI): Please consider ordering Novolog bedtime correction scale.  Thanks, Barnie Alderman, RN, MSN, CCRN Diabetes Coordinator Inpatient Diabetes Program [phone] (Team Pager) [phone] ([facility] office) [phone] [facility] office)

## 2014-02-19 NOTE — Progress Notes (Signed)
Physical Therapy Treatment Patient Details Name: [name] MRN: [id number] DOB: 03-05-1931 Today's Date: 02/19/2014    History of Present Illness 80 year old woman presents the emergency department with history of right-sided weakness when she woke up this morning approximately 8 AM. Initial evaluation in the emergency department was notable for negative CT head, unremarkable blood work,UTI discovered.Patient has early dementia and has limited ability to provide history. In addition history was obtained from daughter and son at bedside with whom the patient lives. Patient has generally been declining since 03/2013 at which time she underwent appendectomy for acute appendicitis. She had several hospitalizations thereafter, including 2 for generalized weakness in July 2014 at which time stroke evaluation was undertaken, carotid ultrasounds revealed bilateral ICA occlusion, MRI of the brain suggested chronic microvascular ischemia, MRI of the brain demonstrated widely patent posterior circulation. Neurology saw the patient that time, recommended statins and antiplatelet therapy for severe extracranial occlusive disease.    PT Comments    PT slow to respond to command but will respond.  Pt deficits appear to be more due to dementia than weakness.   Follow Up Recommendations  SNF     Equipment Recommendations  None recommended by PT    Recommendations for Other Services  none     Precautions / Restrictions Precautions Precautions: Fall Restrictions Weight Bearing Restrictions: No    Mobility  Bed Mobility   Bed Mobility: Supine to Sit     Supine to sit: Mod assist        Transfers   Equipment used: Rolling walker (2 wheeled)   Sit to Stand: Min assist Stand pivot transfers: Min guard          Ambulation/Gait Ambulation/Gait assistance: Mod assist Ambulation Distance (Feet): 5 Feet Assistive device: Rolling walker (2 wheeled) Gait Pattern/deviations:  Shuffle;Decreased step length - right;Decreased step length - left   Gait velocity interpretation: Below normal speed for age/gender General Gait Details: Pt difficulty seem more to do with dementia than physical.  Pt needs verbal  and visual cuing to get pt to advance LE.  Therapist ended up tapping her foot and explaining pt pt that she needed to try and advance her foot to the therapist foot.          Cognition   Behavior During Therapy: Flat affect Overall Cognitive Status: History of cognitive impairments - at baseline Area of Impairment: Orientation;Safety/judgement;Awareness Orientation Level: Place;Time;Situation       Safety/Judgement: Decreased awareness of safety          Exercises General Exercises - Lower Extremity Ankle Circles/Pumps: Both;10 reps Quad Sets: 10 reps Gluteal Sets:  (bridge) Long Arc Quad: Both;5 reps Heel Slides: Both;10 reps Hip ABduction/ADduction: Both;10 reps Straight Leg Raises: Both;10 reps        Pertinent Vitals/Pain None noted           PT Goals (current goals can now be found in the care plan section) Acute Rehab PT Goals PT Goal Formulation: With patient/family Time For Goal Achievement: 02/22/14 Potential to Achieve Goals: Good Progress towards PT goals: Progressing toward goals    Frequency  Min 3X/week    PT Plan Frequency needs to be updated       End of Session Equipment Utilized During Treatment: Gait belt Activity Tolerance: Patient tolerated treatment well (limited by dementia) Patient left: in chair;with chair alarm set;with call bell/phone within reach     Time: 1520-1544 PT Time Calculation (min): 24 min  Charges:  $Gait Training: 8-22  mins $Therapeutic Exercise: 8-22 mins                    G Codes:      Delonda Coley,CINDY Mar 08, 2014, 3:45 PM

## 2014-02-19 NOTE — Progress Notes (Signed)
OT Cancellation Note  Patient Details Name: [name] MRN: [id number] DOB: 11-Jun-1931   Cancelled Treatment:    Reason Eval/Treat Not Completed: Patient at procedure or test/ unavailable. MD arrived for morning rounds at beginning of OT eval. Will re-attempt OT eval at later time/date.  Bea Graff, MS, OTR/L [phone]  02/19/2014, 9:32 AM

## 2014-02-19 NOTE — Care Management Note (Addendum)
Page 1 of 2   02/20/2014     11:01:26 AM   CARE MANAGEMENT NOTE 02/20/2014  Patient:  [name], [name]   Account Number:  [id number]  Date Initiated:  02/19/2014  Documentation initiated by:  Theophilus Kinds  Subjective/Objective Assessment:   Pt admitted from home with UTI. Pt lives at home with a son and daughter who takes shifts in caring for the pt. Pt has a w/c, rollator, walker, 3N1. Pt is active with [facility] RN, PT, OT, and ST.     Action/Plan:   Will arrange resumption of [facility] at discharge (per pts choice). Will continue to follow for discharge planning needs.   Anticipated DC Date:  02/20/2014   Anticipated DC Plan:  [place]  CM consult      [facility] Choice  Resumption Of Svcs/PTA Provider   Choice offered to / List presented to:  C-1 Patient        [place] arranged  HH-1 RN  [place].   Status of service:  Completed, signed off Medicare Important Message given?  YES (If response is "NO", the following Medicare IM given date fields will be blank) Date Medicare IM given:  02/20/2014 Date Additional Medicare IM given:    Discharge Disposition:  [place]  Per UR Regulation:    If discussed at Long Length of Stay Meetings, dates discussed:    Comments:  02/20/14 [place], RN BSN CM Pt discharged home with resumption of [facility]  (per pts choice). Romualdo Bolk of [facility] is aware and will collect the pts information from the chart. [place] services to start within 48 hours of discharge. No DME needs noted at this time. Pt and pts nurse aware of discharge arrangements.  02/19/14 [place], RN BSN CM

## 2014-02-19 NOTE — Progress Notes (Signed)
PROGRESS NOTE  [name] KRC:[id number] DOB: 1931-04-19 DOA: 02/16/2014 PCP: [name], MD  Summary: 80 year old woman presents the emergency department with history of right-sided weakness when she woke up date admission. Initial evaluation in the emergency department was notable for negative CT head, unremarkable blood work, and nonfocal neurologic examination. UTI discovered. She had a few episodes of tremors and generalized weakness with possible right-sided weakness which at this point is felt to be functional in nature. MRI of the brain was negative for stroke, MRI revealed stable cerebrovascular disease. Neurology evaluated, anticipate discharge 4/7 home.  Assessment/Plan: 1. Reported right-sided upper and lower extremity weakness of unclear etiology and significance. MRI/MRA without change. Suspect vasovagal/orthostatic phenomenon based on previous history complicated by dementia and UTI. TIA in differential though less likely, also possibly related to accelerated HTN. I doubt seizure by history and has had 2 negative EEG in past. Statin has been added, aspirin changed to Plavix. 2. H/o bilateral ICA occlusions, severe extracranial occlusive disease (treated with statin and antiplatelet agent), orthostatic hypotension thought to be secondary to diabetic polyneuropathy/diabetic dysautonomia treated with support stockings and midodrine in the past (stopped as per cardiology). Stable by MRI/MRA. 3. UTI. Completed treatment. 4. Diabetes mellitus. Blood sugars are elevated. Lantus restarted yesterday. Continue meal coverage, Lantus. Adjust sliding scale insulin. 5. Accelerated Hypertension.  Now well-controlled on home regimen. Reviewed daughter's logbook, systolic blood pressure over the last year has ranged predominantly 160-180s, up to 200s that time. 6. Early dementia. Stable. 7. Nonspecific EKG changes. No chest pain, signs or symptoms of ACS. Troponins negative. Likely related to  LVH. 8. Nodular opacity left upper lobe. Advise correlation with noncontrast enhanced chest CT to further evaluate as an outpatient.   Discussed with neurology. Complete evaluation today. Reassess orthostatics. Likely discharge 4/7.  Discussed in detail with son at bedside.  Code Status: full code DVT prophylaxis: Lovenox Family Communication:  Disposition Plan:   Murray Hodgkins, MD  [facility]  Pager [phone] If 7PM-7AM, please contact night-coverage at [email or web address], password [facility] 02/19/2014, 6:21 PM  LOS: 3 days   Consultants:    Procedures:  2-D echocardiogram. LVEF 65-70%. Normal wall motion. Grade 1 diastolic dysfunction. Severe LVH.  Antibiotics:  Levaquin 4/3 >> 4/5  HPI/Subjective: Feels well today. No recurrent spells, no weakness. Eating dinner without difficulty.  Objective: Filed Vitals:   02/18/14 2018 02/19/14 0606 02/19/14 1042 02/19/14 1618  BP: 115/72 133/49 117/65 114/75  Pulse: 77 77 87 90  Temp: 97.6 F (36.4 C) 97.6 F (36.4 C) 98.1 F (36.7 C) 98.6 F (37 C)  TempSrc: Oral Oral Oral Oral  Resp: _0 Height:      Weight:      SpO2: 94% 98% 96% 97%    Intake/Output Summary (Last 24 hours) at 02/19/14 1821 Last data filed at 02/19/14 0340  Gross per 24 hour  Intake    320 ml  Output      0 ml  Net    320 ml     Filed Weights   02/16/14 1559 02/17/14 0415 02/17/14 0613  Weight: 70 kg (154 lb 5.2 oz) 69.5 kg (153 lb 3.5 oz) 70.7 kg (155 lb 13.8 oz)    Exam:   Afebrile, vital signs are stable  Gen. Appears calm and comfortable. Speech fluent and clear.  Cardiovascular regular rate and rhythm. No murmur, regular. No lower extremity edema.  Respiratory clear to auscultation bilaterally. No wheezes, rales or rhonchi. Normal respiratory effort.  Neurologic grossly nonfocal.  Data Reviewed:  Capillary blood sugars high, just restarted on Lantus.  Scheduled Meds: . clopidogrel  75 mg Oral Q breakfast  .  docusate sodium  100 mg Oral QHS  . enoxaparin (LOVENOX) injection  40 mg Subcutaneous Q24H  . insulin aspart  0-9 Units Subcutaneous TID WC  . insulin aspart  3 Units Subcutaneous Q supper  . insulin glargine  3-8 Units Subcutaneous QHS  . levofloxacin  250 mg Oral Daily  . lisinopril  20 mg Oral Daily  . megestrol  400 mg Oral BID  . metoprolol succinate  50 mg Oral Daily  . mirtazapine  30 mg Oral QHS  . pantoprazole  80 mg Oral Daily  . simvastatin  40 mg Oral q1800   Continuous Infusions:   Principal Problem:   Right sided weakness Active Problems:   DIABETES MELLITUS, TYPE II, UNCONTROLLED   HYPERTENSION   Leg weakness, bilateral   UTI (lower urinary tract infection)   Accelerated hypertension   Time spent 20 minutes

## 2014-02-20 DIAGNOSIS — R0609 Other forms of dyspnea: Secondary | ICD-10-CM

## 2014-02-20 DIAGNOSIS — R0989 Other specified symptoms and signs involving the circulatory and respiratory systems: Secondary | ICD-10-CM

## 2014-02-20 DIAGNOSIS — R05 Cough: Secondary | ICD-10-CM

## 2014-02-20 DIAGNOSIS — R059 Cough, unspecified: Secondary | ICD-10-CM

## 2014-02-20 DIAGNOSIS — R9439 Abnormal result of other cardiovascular function study: Secondary | ICD-10-CM

## 2014-02-20 DIAGNOSIS — I951 Orthostatic hypotension: Principal | ICD-10-CM

## 2014-02-20 LAB — GLUCOSE, CAPILLARY: Glucose-Capillary: 236 mg/dL — ABNORMAL HIGH (ref 70–99)

## 2014-02-20 MED ORDER — INSULIN GLARGINE 100 UNIT/ML ~~LOC~~ SOLN
10.0000 [IU] | Freq: Every day | SUBCUTANEOUS | Status: DC
  Filled 2014-02-20: qty 0.1

## 2014-02-20 MED ORDER — ATORVASTATIN CALCIUM 10 MG PO TABS: 10.0000 mg | ORAL_TABLET | Freq: Every day | ORAL | Status: AC

## 2014-02-20 MED ORDER — INSULIN ASPART 100 UNIT/ML ~~LOC~~ SOLN
5.0000 [IU] | Freq: Three times a day (TID) | SUBCUTANEOUS | Status: DC
  Administered 2014-02-20: 5 [IU] via SUBCUTANEOUS

## 2014-02-20 MED ORDER — CLOPIDOGREL BISULFATE 75 MG PO TABS: 75.0000 mg | ORAL_TABLET | Freq: Every day | ORAL | Status: DC

## 2014-02-20 NOTE — Progress Notes (Signed)
Patient discharged with instructions given on medications,and follow up visits,family verbalized understanding. Prescriptions sent with patient. Accompanied by staff to an awaiting vehicle.No c/o pain or discomfort noted.

## 2014-02-20 NOTE — Progress Notes (Signed)
SLP Cancellation Note  Patient Details Name: [name] MRN: [id number] DOB: August 03, 1931   Cancelled treatment:       Pt in process of being discharged therefore unable to complete ST eval   Pollyann Glen 02/20/2014, 12:12 PM

## 2014-02-20 NOTE — Progress Notes (Signed)
UR chart review completed.  

## 2014-02-20 NOTE — Progress Notes (Signed)
Michele A. Merlene Laughter, MD     [email or web address]          [name] is an 80 y.o. female.   Assessment/Plan: 1. Acute on chronic gait impairment. The etiology is unclear at this time but there appears to be significant apraxia which suggests the possibility of hydrocephalus although this is not documented on imaging. The etiology could be due to significant chronic ischemic white matter changes. Orthostatic hypotension is also an issue as she has had this documented previously. Consequently, the patient will be re-checked for orthostasis which could explain the problem as she has good strength in the legs. Imaging does not suggest an acute infarct. The patient is set up to have physical therapy. 2. Vascular dementia. Dementia labs neg. 3. Severe intracranial and extracranial occlusive disease. The patient should be maintained on antiplatelet agent and statin medication. Blood pressure and diabetes control also suggested. Given the patient's comorbidities, I do not believe that she is a candidate for carotid endarterectomy on the left side. The right side is already occluded.    Lying down blood pressure 120/69 heart rate 88 standing 143/80 heart rate 104.  GENERAL: This is a pleasant thin lady in no acute distress.  HEENT: Supple. Atraumatic normocephalic.  ABDOMEN: soft  EXTREMITIES: No edema. Moderate arthritic changes of the knees.  BACK: Normal.  SKIN: Normal by inspection.  MENTAL STATUS: The patient is awake and alert. She is oriented to person and hospital although she thinks she is at [facility] long hospital in [place]. She is not oriented to time. She follows commands well. There is no dysarthria.   MOTOR: Normal tone, bulk and strength; no pronator drift.  COORDINATION: Left finger to nose is normal, right finger to nose is normal, No rest tremor; no intention tremor; no postural tremor; no bradykinesia.    Objective: Vital signs in last 24  hours: Temp:  [97.6 F (36.4 C)-99.1 F (37.3 C)] 99.1 F (37.3 C) (04/07 0703) Pulse Rate:  [84-104] 94 (04/07 0255) Resp:  [20] 20 (04/07 0703) BP: (114-148)/(61-84) 148/70 mmHg (04/07 0703) SpO2:  [92 %-100 %] 95 % (04/07 0703)  Intake/Output from previous day: 04/06 0701 - 04/07 0700 In: 360 [P.O.:360] Out: -  Intake/Output this shift:   Nutritional status:     Lab Results: Results for orders placed during the hospital encounter of 02/16/14 (from the past 48 hour(s))  URINE CULTURE     Status: None   Collection Time    02/18/14  8:59 AM      Result Value Ref Range   Specimen Description URINE, CATHETERIZED     Special Requests NONE     Culture  Setup Time       Value: 02/19/2014 01:16     Performed at [facility]       Value: NO GROWTH     Performed at [facility]   Culture       Value: NO GROWTH     Performed at [facility]   Report Status 02/19/2014 FINAL    GLUCOSE, CAPILLARY     Status: Abnormal   Collection Time    02/18/14 12:04 PM      Result Value Ref Range   Glucose-Capillary 257 (*) 70 - 99 mg/dL   Comment 1 Notify RN    GLUCOSE, CAPILLARY     Status: Abnormal   Collection Time    02/18/14  4:23 PM  Result Value Ref Range   Glucose-Capillary 177 (*) 70 - 99 mg/dL   Comment 1 Notify RN    GLUCOSE, CAPILLARY     Status: Abnormal   Collection Time    02/18/14  8:21 PM      Result Value Ref Range   Glucose-Capillary 331 (*) 70 - 99 mg/dL  GLUCOSE, CAPILLARY     Status: Abnormal   Collection Time    02/19/14  7:26 AM      Result Value Ref Range   Glucose-Capillary 246 (*) 70 - 99 mg/dL   Comment 1 Notify RN    RPR     Status: None   Collection Time    02/19/14  9:54 AM      Result Value Ref Range   RPR NON REACTIVE  NON REACTIVE   Comment: Performed at [facility]     Status: Abnormal   Collection Time    02/19/14  9:54 AM      Result Value Ref Range   Vitamin B-12 1982  (*) 211 - 911 pg/mL   Comment: Performed at [facility]  TSH     Status: None   Collection Time    02/19/14  9:54 AM      Result Value Ref Range   TSH 1.190  0.350 - 4.500 uIU/mL   Comment: Please note change in reference range.     Performed at [place]     Status: None   Collection Time    02/19/14  9:54 AM      Result Value Ref Range   Homocysteine 7.4  4.0 - 15.4 umol/L   Comment: Performed at [place], CAPILLARY     Status: Abnormal   Collection Time    02/19/14 11:44 AM      Result Value Ref Range   Glucose-Capillary 470 (*) 70 - 99 mg/dL   Comment 1 Notify RN    GLUCOSE, CAPILLARY     Status: Abnormal   Collection Time    02/19/14  1:20 PM      Result Value Ref Range   Glucose-Capillary 376 (*) 70 - 99 mg/dL   Comment 1 Notify RN    GLUCOSE, CAPILLARY     Status: Abnormal   Collection Time    02/19/14  4:24 PM      Result Value Ref Range   Glucose-Capillary 281 (*) 70 - 99 mg/dL   Comment 1 Notify RN     Comment 2 Documented in Chart    GLUCOSE, CAPILLARY     Status: Abnormal   Collection Time    02/19/14 10:35 PM      Result Value Ref Range   Glucose-Capillary 295 (*) 70 - 99 mg/dL  GLUCOSE, CAPILLARY     Status: Abnormal   Collection Time    02/20/14  8:05 AM      Result Value Ref Range   Glucose-Capillary 236 (*) 70 - 99 mg/dL   Comment 1 Notify RN      Lipid Panel No results found for this basename: CHOL, TRIG, HDL, CHOLHDL, VLDL, LDLCALC,  in the last 72 hours  Studies/Results: No results found.  Medications:  Scheduled Meds: . clopidogrel  75 mg Oral Q breakfast  . docusate sodium  100 mg Oral QHS  . enoxaparin (LOVENOX) injection  40 mg Subcutaneous Q24H  . insulin aspart  0-15 Units Subcutaneous TID WC  . insulin aspart  0-5 Units Subcutaneous QHS  . insulin aspart  3 Units Subcutaneous Q supper  . insulin glargine  3-8 Units Subcutaneous QHS  . levofloxacin  250 mg Oral Daily  .  lisinopril  20 mg Oral Daily  . megestrol  400 mg Oral BID  . metoprolol succinate  50 mg Oral Daily  . mirtazapine  30 mg Oral QHS  . pantoprazole  80 mg Oral Daily  . simvastatin  40 mg Oral q1800   Continuous Infusions:  PRN Meds:.tiotropium     LOS: 4 days   Orian Figueira A. Merlene Chambers, M.D.  Diplomate, Tax adviser of Psychiatry and Neurology ( Neurology).

## 2014-02-20 NOTE — Evaluation (Signed)
Occupational Therapy Evaluation Patient Details Name: DAYRIN STALLONE MRN: [id number] DOB: 01-20-1931 Today's Date: 02/20/2014    History of Present Illness 80 year old woman presents the emergency department with history of right-sided weakness when she woke up this morning approximately 8 AM. Initial evaluation in the emergency department was notable for negative CT head, unremarkable blood work,UTI discovered.Patient has early dementia and has limited ability to provide history. In addition history was obtained from daughter and son at bedside with whom the patient lives. Patient has generally been declining since 03/2013 at which time she underwent appendectomy for acute appendicitis. She had several hospitalizations thereafter, including 2 for generalized weakness in July 2014 at which time stroke evaluation was undertaken, carotid ultrasounds revealed bilateral ICA occlusion, MRI of the brain suggested chronic microvascular ischemia, MRI of the brain demonstrated widely patent posterior circulation. Neurology saw the patient that time, recommended statins and antiplatelet therapy for severe extracranial occlusive disease.   Clinical Impression   Pt is presenting to acute OT with above situation.  At baseline she requires about 50% assist withADLs, per daughter, and is currently presenting at the same level with seated ADL tasks.  Transfers currently requires increased assist.  Pt will benefit from continuation of previously set up [facility] services for increased safety at home and caregiver education.  Recommend HHOT and follow-through with planned purchase of TTB.    Follow Up Recommendations  Home health OT (Continuation of [facility] OT pt was receiving)    Equipment Recommendations  Tub/shower bench    Recommendations for Other Services       Precautions / Restrictions Precautions Precautions: Fall Restrictions Weight Bearing Restrictions: No      Mobility Bed Mobility Overal  bed mobility: Needs Assistance                Transfers Overall transfer level: Needs assistance                    Balance                                            ADL Overall ADL's : At baseline                                       General ADL Comments: pt has been receiving approx modA in ADL tasks from her daughter and son.  Current level for ADL tasks will require approximately the same assist when completing tasks in seated position.  Daughter indicates increased need for cueing and assist over the past few months, but this is likely due to a progressing of pt's dementia.     Vision                     Perception     Praxis      Pertinent Vitals/Pain Pt denies pain     Hand Dominance Right   Extremity/Trunk Assessment Upper Extremity Assessment Upper Extremity Assessment: Generalized weakness (Pt has increased pain with right shoulder and elbow movements, but strenght is not a limiting facot. Pt presents at grossly 4/5, 4-/5 strength in bilateral UE.)   Lower Extremity Assessment Lower Extremity Assessment: Defer to PT evaluation       Communication Communication Communication: No difficulties   Cognition Arousal/Alertness:  Awake/alert Behavior During Therapy: Flat affect Overall Cognitive Status: History of cognitive impairments - at baseline Area of Impairment: Orientation;Safety/judgement;Awareness Orientation Level: Disoriented to;Time;Place   Memory: Decreased short-term memory   Safety/Judgement: Decreased awareness of safety         General Comments       Exercises       Shoulder Instructions      Home Living Family/patient expects to be discharged to:: Private residence Living Arrangements: Children Available Help at Discharge: Family Type of Home: House Home Access: Ramped entrance           Bathroom Shower/Tub: Tub/shower unit;Walk-in shower (Walk in shower in pt's  room, transitioning to tub/shower in other bathroom for improved accessibility and safety)   Bathroom Toilet: Handicapped height (3 in 1 over toilet and beside bed) Bathroom Accessibility: Yes   Home Equipment: Shower seat;Walker - 2 wheels;Grab bars - tub/shower;Hand held shower head;Bedside commode          Prior Functioning/Environment Level of Independence: Needs assistance    ADL's / Homemaking Assistance Needed: per daughter, pt has been receiving IADL assist and approximately modA with dressing, bathing, eating tasks        OT Diagnosis: Generalized weakness;Acute pain   OT Problem List: Decreased strength;Decreased activity tolerance;Decreased coordination;Decreased cognition;Decreased safety awareness;Pain   OT Treatment/Interventions:      OT Goals(Current goals can be found in the care plan section) Acute Rehab OT Goals Patient Stated Goal: none state. Not OT goals required - OT to be continued in next enue of care. OT Goal Formulation: With patient/family  OT Frequency:     Barriers to D/C:            Co-evaluation              End of Session    Activity Tolerance: Patient tolerated treatment well Patient left: in bed;with family/visitor present;with call bell/phone within reach;with bed alarm set;with nursing/sitter in room   Time: 7182-0990 OT Time Calculation (min): 28 min Charges:  OT General Charges $OT Visit: 1 Procedure OT Evaluation $Initial OT Evaluation Tier I: 1 Procedure G-Codes:     Bea Graff, MS, OTR/L [phone]  02/20/2014, 9:40 AM

## 2014-02-20 NOTE — Discharge Summary (Addendum)
Physician Discharge Summary  [name] ZOX:[id number] DOB: [birth year] DOA: 02/16/2014  PCP: [name], MD  Admit date: 02/16/2014 Discharge date: 02/20/2014  Recommendations for Outpatient Follow-up:  1. Health PT/OT/RN/aid 2. Primary care Doctor in 2 weeks, review CBGs. Outpatient chest CT with contrast to evaluate pulmonary nodule.  Discharge Diagnoses:  Principal Problem:   Right sided weakness possibly due to orthostatic hypotension and ICA stenosis Active Problems:   DIABETES MELLITUS, TYPE II, UNCONTROLLED   HYPERTENSION   Leg weakness, bilateral   UTI (lower urinary tract infection)   Accelerated hypertension   Carotid artery disease   Orthostatic hypotension   Discharge Condition: Stable, improved  Diet recommendation: Regular  Wt Readings from Last 3 Encounters:  02/17/14 70.7 kg (155 lb 13.8 oz)  12/01/13 63.504 kg (140 lb)  08/17/13 66.407 kg (146 lb 6.4 oz)    History of present illness:  80 year old woman presents the emergency department with history of right-sided weakness when she woke up this morning approximately 8 AM. Initial evaluation in the emergency department was notable for negative CT head, unremarkable blood work, and nonfocal neurologic examination per Dr. Jeneen Rinks. UTI discovered. Referred for observation and further evaluation.   Patient has early dementia and has limited ability to provide history. In addition history was obtained from daughter and son at bedside with whom the patient lives. Patient has generally been declining since 03/2013 at which time she underwent appendectomy for acute appendicitis. She had several hospitalizations thereafter, including 2 for generalized weakness in July 2014 at which time stroke evaluation was undertaken, carotid ultrasounds revealed bilateral ICA occlusion, MRI of the brain suggested chronic microvascular ischemia, MRI of the brain demonstrated widely patent posterior circulation. Neurology saw the patient that  time, recommended statins and antiplatelet therapy for severe extracranial occlusive disease.   She has been essentially wheelchair-bound with very limited transfer/ambulation the last several months for unclear reasons. She also has developed dementia, intolerant to Namenda and Aricept secondary to rash.  For the last 2 weeks patient has had intermittent right-sided weakness as well as generalized lower extremity weakness. She was seen by her primary care physician for this very reason and outpatient physical therapy was pursued. Per family, physical therapist noted right-sided weakness at times as well as some pain in the right arm including yesterday.   Today, the patient woke her daughter up (dgt sleeps in her room) in AM to go to bathroom, at which time the chart are noted right-sided weakness and difficulty standing and maintaining posture. No confusion, dysarthria or apparent difficulty swallowing. No seizure activity. After some time the patient was able to maintain posture and right-sided weakness improved. Per family "this was the worst" they had seen over the last 2 weeks. By the time she arrived at the emergency department she had no focal neurologic deficits. Currently the patient has no complaints except for some blurry vision in her left eye, duration unclear.   Pain.  In the emergency department afebrile with stable vital signs. No hypoxia. Complete metabolic panel, troponin, CBC unremarkable. Urinalysis was grossly positive. CT of the head no acute abnormalities. EKG independently reviewed demonstrated normal sinus rhythm, left atrial enlargement, inverted T waves, consider lateral ischemia (previous tracing 12/01/2013 with nonspecific lateral ST changes).  Hospital Course:  1. Reported right-sided upper and lower extremity weakness of unclear etiology and significance. MRI/MRA without change. Suspect vasovagal/orthostatic phenomenon based on previous history complicated by dementia and  UTI. TIA in differential though less likely, also possibly related  to accelerated HTN. I doubt seizure by history and has had 2 negative EEG in past.  She was seen by neurology.  Statin was been added, aspirin changed to Plavix.  Her strength returned spontaneously.  2. H/o bilateral ICA occlusions, severe extracranial occlusive disease (treated with statin and antiplatelet agent), orthostatic hypotension thought to be secondary to diabetic polyneuropathy/diabetic dysautonomia treated with support stockings and midodrine in the past (stopped as per cardiology). Stable by MRI/MRA. 3. UTI. Completed treatment. Diabetes mellitus. Blood sugars are elevated, increasing risk of dehydration.  Resume previous insulin regimen and check frequent CBGs. If they remain elevated, talk to primary care Doctor about adjusting doses.  Accelerated Hypertension. Now well-controlled on home regimen. Reviewed daughter's logbook, systolic blood pressure over the last year has ranged predominantly 160-180s, up to 200s that time. 4. Early dementia. Stable and continued on medications.    Because of her dementia, she is at risk of not eating and drinking much and therefore is at high risk of dehydration and orthostatic hypotension.  Encourage sips throughout the day.  She is already on Megace.   5. Nonspecific EKG changes. No chest pain, signs or symptoms of ACS. Troponins negative. Likely related to LVH. 6. Nodular opacity left upper lobe. Advise correlation with noncontrast enhanced chest CT to further evaluate as an outpatient.  Consultants:  Neurology Procedures:  2-D echocardiogram. LVEF 65-70%. Normal wall motion. Grade 1 diastolic dysfunction. Severe LVH. Antibiotics:  Levaquin 4/3 >> 4/5   Discharge Exam: Filed Vitals:   02/20/14 0703  BP: 148/70  Pulse:   Temp: 99.1 F (37.3 C)  Resp: 20   Filed Vitals:   02/19/14 2333 02/19/14 2334 02/20/14 0255 02/20/14 0703  BP: 139/75 126/76 130/84 148/70  Pulse: 84   94   Temp:   97.6 F (36.4 C) 99.1 F (37.3 C)  TempSrc: Oral  Oral Oral  Resp: _0 Height:      Weight:      SpO2: 96%  98% 95%    General: African American female, no acute distress HEENT: Face symmetric, normal cephalic atraumatic, moist mucous membranes  Cardiovascular: regular rate and rhythm, 3/6 systolic murmur at the left sternal border, no gallop, 2+ pulses Respiratory: Clear to auscultation bilaterally Abdomen: Normoactive bowel sounds, soft, mildly distended and gassy, nontender MSK: No works to me edema, normal tone and bulk Neuro: Cranial nerves II through XII grossly intact, strength appears to be generally preserved bilaterally  Discharge Instructions      Discharge Orders   Future Orders Complete By Expires   Call MD for:  difficulty breathing, headache or visual disturbances  As directed    Call MD for:  extreme fatigue  As directed    Call MD for:  hives  As directed    Call MD for:  persistant dizziness or light-headedness  As directed    Call MD for:  persistant nausea and vomiting  As directed    Call MD for:  severe uncontrolled pain  As directed    Call MD for:  temperature >100.4  As directed    Diet Carb Modified  As directed    Discharge instructions  As directed    Comments:     Ms. Spillman was hospitalized with weakness and was seen by Neurology.  She did not have a stroke. Her medications were changed to try to reduce the change of having a stroke. She was started on atorvastatin and her aspirin was changed to plavix.  She was treated for a urinary tract infection.  She had a nodule on her chest X-ray and her primary care doctor may want to order a CAT scan to look at this more closely.   Increase activity slowly  As directed        Medication List    STOP taking these medications       aspirin 325 MG tablet      TAKE these medications       ALPRAZolam 0.25 MG dissolvable tablet  Commonly known as:  NIRAVAM  Take 0.25 mg by mouth  at bedtime as needed.     atorvastatin 10 MG tablet  Commonly known as:  LIPITOR  Take 1 tablet (10 mg total) by mouth daily.     cholecalciferol 1000 UNITS tablet  Commonly known as:  VITAMIN D  Take 1,000 Units by mouth every morning.     clopidogrel 75 MG tablet  Commonly known as:  PLAVIX  Take 1 tablet (75 mg total) by mouth daily with breakfast.     Co Q 10 100 MG Caps  Take 100 mg by mouth every morning.     docusate sodium 100 MG capsule  Commonly known as:  COLACE  Take 100 mg by mouth at bedtime.     esomeprazole 40 MG capsule  Commonly known as:  NEXIUM  Take 40 mg by mouth at bedtime.     fish oil-omega-3 fatty acids 1000 MG capsule  Take 1 g by mouth every morning.     glyBURIDE-metformin 5-500 MG per tablet  Commonly known as:  GLUCOVANCE  Take 2 tablets by mouth 2 (two) times daily with a meal.     insulin aspart 100 UNIT/ML injection  Commonly known as:  novoLOG  Inject 3 Units into the skin daily after breakfast.     insulin glargine 100 UNIT/ML injection  Commonly known as:  LANTUS  Inject 3-8 Units into the skin at bedtime. Give if BS 140-150= 3 units, 151-160=4 units, 161-170=5 units, 171-180= 6 units, >181=7units     lisinopril 20 MG tablet  Commonly known as:  PRINIVIL,ZESTRIL  Take 1 tablet (20 mg total) by mouth daily.     megestrol 400 MG/10ML suspension  Commonly known as:  MEGACE  Take 10 mLs (400 mg total) by mouth 2 (two) times daily.     metoprolol succinate 50 MG 24 hr tablet  Commonly known as:  TOPROL-XL  Take 1 tablet (50 mg total) by mouth daily. Take with or immediately following a meal.     mirtazapine 30 MG tablet  Commonly known as:  REMERON  Take 30 mg by mouth at bedtime.     tiotropium 18 MCG inhalation capsule  Commonly known as:  SPIRIVA  Place 18 mcg into inhaler and inhale daily as needed (shortness of breath).       Follow-up Information   Follow up with [name], MD. Schedule an appointment as soon as  possible for a visit in 1 week.   Specialty:  Internal Medicine   Contact information:   [address] [place] [address] [phone]        The results of significant diagnostics from this hospitalization (including imaging, microbiology, ancillary and laboratory) are listed below for reference.    Significant Diagnostic Studies: Dg Chest 2 View  02/16/2014   CLINICAL DATA:  CVA  EXAM: CHEST  2 VIEW  COMPARISON:  December 01, 2013  FINDINGS: There is again noted  airspace opacity in the right lower lobe.  There is a nodular opacity in the left upper lobe measuring 1.5 x 1.0 cm.  Lungs are otherwise clear. Heart size and pulmonary vascularity are normal. No adenopathy.  IMPRESSION: Question persistent versus recurrent infiltrate right lower lobe.  Nodular opacity left upper lobe. Advise correlation with noncontrast enhanced chest CT to further evaluate.  Lungs elsewhere clear.   Electronically Signed   By: Lowella Grip M.D.   On: 02/16/2014 20:29   Ct Head Wo Contrast  02/16/2014   CLINICAL DATA:  Generalized weakness  EXAM: CT HEAD WITHOUT CONTRAST  TECHNIQUE: Contiguous axial images were obtained from the base of the skull through the vertex without intravenous contrast.  COMPARISON:  06/23/2013  FINDINGS: No sign of acute infarction. There are chronic small vessel changes throughout the white matter. There is an old left frontal cortical and subcortical infarction. No mass lesion, hemorrhage, hydrocephalus or extra-axial collection. No calvarial abnormality. No inflammatory sinus disease.  IMPRESSION: No acute finding. Chronic small vessel disease. 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   Electronically Signed   By: Nelson Chimes M.D.   On: 02/16/2014 12:07   Mr Jodene Nam Head Wo Contrast  02/16/2014   CLINICAL DATA:  Right-sided weakness.  EXAM: MRA HEAD WITHOUT CONTRAST  TECHNIQUE: Angiographic images of the Circle of Willis were obtained using MRA technique  without intravenous contrast.  COMPARISON:  CT HEAD W/O CM dated 02/16/2014; US CAROTID DUPLEX BILAT dated 06/12/2013; MR HEAD WO/W CM dated 06/12/2013; MR MRA HEAD W/O CM dated 06/12/2013  FINDINGS: Basilar artery of remains widely patent at dolichoectatic. Vertebrals are codominant.  Hypertrophied right posterior communicating artery is the predominant contribution the right anterior circulation. The ICA terminus is reconstituted, with widely patent M1 MCA. There is severe disease at the origin of the right A1 ACA, likely 75-90% stenosis. Both anterior cerebrals in their A2 segments are diminutive.  The left internal carotid artery is patent but significantly diminished in caliber, suggesting a proximal ICA stenosis. Left ophthalmic artery can be seen, potentially a source of collateral flow. There is faint visualization of the carotid siphon, with more robust appearance of the left ICA terminus. The M1 segment of the left middle cerebral artery is patent but small. Severe disease of a proximal M2 left MCA segment. I am not able to clearly see the left A1 anterior cerebral artery.  Both posterior cerebral arteries are widely patent. There is no cerebellar branch occlusion observed.  IMPRESSION: Severe cerebrovascular disease, but without significant progression from July 2014. The right ICA remains occluded, there is severe diffuse disease of the left ICA, but the posterior circulation is robust. See discussion above.   Electronically Signed   By: Rolla Flatten M.D.   On: 02/16/2014 20:51   Mr Brain Wo Contrast  02/16/2014   CLINICAL DATA:  Right-sided weakness. Stroke risk factors include tobacco use, diabetes mellitus, hypertension, and hyperlipidemia as well as carotid artery disease.  EXAM: MRI HEAD WITHOUT CONTRAST  TECHNIQUE: Multiplanar, multiecho pulse sequences of the brain and surrounding structures were obtained without intravenous contrast.  COMPARISON:  MR MRA HEAD W/O CM dated 02/16/2014  FINDINGS: No  evidence for acute infarction, hemorrhage, mass lesion, hydrocephalus, or extra-axial fluid. Moderate cerebral and cerebellar atrophy. Advanced periventricular greater than subcortical white matter signal abnormality, consistent with chronic microvascular ischemic change. Remote left frontal infarct. Occluded right internal carotid artery with severe flow reduction left internal carotid described below. Hypertrophied basilar artery. No  acute sinus or mastoid disease. Negative osseous structures.  Right parotid lesion remains present and not significantly changed from priors. This could be seen best on image 1 series 3 approximate cross-sectional measurements of 16 x 30 mm. 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 ENT consultation is warranted. Little change compared with prior MR or CT.  IMPRESSION: No acute stroke.  Chronic changes as described.  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 See discussion above.   Electronically Signed   By: Rolla Flatten M.D.   On: 02/16/2014 20:44    Microbiology: Recent Results (from the past 240 hour(s))  URINE CULTURE     Status: None   Collection Time    02/18/14  8:59 AM      Result Value Ref Range Status   Specimen Description URINE, CATHETERIZED   Final   Special Requests NONE   Final   Culture  Setup Time     Final   Value: 02/19/2014 01:16     Performed at [facility]     Final   Value: NO GROWTH     Performed at [facility]   Culture     Final   Value: NO GROWTH     Performed at [facility]   Report Status 02/19/2014 FINAL   Final     Labs: Basic Metabolic Panel:  Recent Labs Lab 02/16/14 1225  NA 141  K 3.7  CL 103  CO2 24  GLUCOSE 140*  BUN 14  CREATININE 0.61  CALCIUM 9.9   Liver Function Tests:  Recent Labs Lab 02/16/14 1225  AST 15  ALT 12  ALKPHOS 87  BILITOT 0.5  PROT 7.9  ALBUMIN 3.6   No results found for this basename: LIPASE,  AMYLASE,  in the last 168 hours No results found for this basename: AMMONIA,  in the last 168 hours CBC:  Recent Labs Lab 02/16/14 1225  WBC 5.2  NEUTROABS 2.2  HGB 12.8  HCT 36.7  MCV 98.4  PLT 270   Cardiac Enzymes:  Recent Labs Lab 02/16/14 1225 02/16/14 1825 02/17/14 0025 02/17/14 0648  TROPONINI <0.30 <0.30 <0.30 <0.30   BNP: BNP (last 3 results)  Recent Labs  06/12/13 1304 06/24/13 0513 02/18/14 0724  PROBNP 880.3* 768.3* 285.7   CBG:  Recent Labs Lab 02/19/14 1144 02/19/14 1320 02/19/14 1624 02/19/14 2235 02/20/14 0805  GLUCAP 470* 376* 281* 295* 236*    Time coordinating discharge: 45 minutes  Signed:  Kalvin Buss  [facility] 02/20/2014, 9:57 AM

## 2014-02-20 NOTE — Progress Notes (Signed)
Inpatient Diabetes Program Recommendations  AACE/ADA: New Consensus Statement on Inpatient Glycemic Control (2013)  Target Ranges:  Prepandial:   less than 140 mg/dL      Peak postprandial:   less than 180 mg/dL (1-2 hours)      Critically ill patients:  140 - 180 mg/dL   Results for [name], [name] (MRN [id number]) as of 02/20/2014 08:54  Ref. Range 02/19/2014 07:26 02/19/2014 11:44 02/19/2014 13:20 02/19/2014 16:24 02/19/2014 22:35 02/20/2014 08:05  Glucose-Capillary Latest Range: 70-99 mg/dL 246 (H) 470 (H) 376 (H) 281 (H) 295 (H) 236 (H)   Diabetes history: DM2  Outpatient Diabetes medications: Lantus 3-8 units QHS (depending on CBG), Glucovance 5-500 mg BID, Novolog 3 units TID with meals  Current orders for Inpatient glycemic control: Lantus 3-8 units QHS (depending on CBG), Novolog 0-15 units AC, Novolog 0-5 units HS, Novolog 3 units TID with meals  Inpatient Diabetes Program Recommendations Insulin - Basal: If not discharged today, please consider increasing Lantus to 10 units QHS. Insulin - Meal Coverage: Please consider increasing Novolog meal coverage to 5 units TID with meals.  Thanks, Barnie Alderman, RN, MSN, CCRN Diabetes Coordinator Inpatient Diabetes Program [phone] (Team Pager) [phone] ([facility] office) [phone] [facility] [place] office)

## 2014-02-21 LAB — GLUCOSE, CAPILLARY: Glucose-Capillary: 328 mg/dL — ABNORMAL HIGH (ref 70–99)

## 2014-02-22 DIAGNOSIS — I10 Essential (primary) hypertension: Secondary | ICD-10-CM | POA: Diagnosis not present

## 2014-02-22 DIAGNOSIS — Z9181 History of falling: Secondary | ICD-10-CM | POA: Diagnosis not present

## 2014-02-22 DIAGNOSIS — E119 Type 2 diabetes mellitus without complications: Secondary | ICD-10-CM | POA: Diagnosis not present

## 2014-02-22 DIAGNOSIS — J449 Chronic obstructive pulmonary disease, unspecified: Secondary | ICD-10-CM | POA: Diagnosis not present

## 2014-02-22 DIAGNOSIS — F0390 Unspecified dementia without behavioral disturbance: Secondary | ICD-10-CM | POA: Diagnosis not present

## 2014-02-22 DIAGNOSIS — F411 Generalized anxiety disorder: Secondary | ICD-10-CM | POA: Diagnosis not present

## 2014-02-23 DIAGNOSIS — F411 Generalized anxiety disorder: Secondary | ICD-10-CM | POA: Diagnosis not present

## 2014-02-23 DIAGNOSIS — F0390 Unspecified dementia without behavioral disturbance: Secondary | ICD-10-CM | POA: Diagnosis not present

## 2014-02-23 DIAGNOSIS — I10 Essential (primary) hypertension: Secondary | ICD-10-CM | POA: Diagnosis not present

## 2014-02-23 DIAGNOSIS — Z9181 History of falling: Secondary | ICD-10-CM | POA: Diagnosis not present

## 2014-02-23 DIAGNOSIS — E119 Type 2 diabetes mellitus without complications: Secondary | ICD-10-CM | POA: Diagnosis not present

## 2014-02-23 DIAGNOSIS — J449 Chronic obstructive pulmonary disease, unspecified: Secondary | ICD-10-CM | POA: Diagnosis not present

## 2014-02-26 DIAGNOSIS — F0390 Unspecified dementia without behavioral disturbance: Secondary | ICD-10-CM | POA: Diagnosis not present

## 2014-02-26 DIAGNOSIS — E119 Type 2 diabetes mellitus without complications: Secondary | ICD-10-CM | POA: Diagnosis not present

## 2014-02-26 DIAGNOSIS — F411 Generalized anxiety disorder: Secondary | ICD-10-CM | POA: Diagnosis not present

## 2014-02-26 DIAGNOSIS — Z9181 History of falling: Secondary | ICD-10-CM | POA: Diagnosis not present

## 2014-02-26 DIAGNOSIS — J449 Chronic obstructive pulmonary disease, unspecified: Secondary | ICD-10-CM | POA: Diagnosis not present

## 2014-02-26 DIAGNOSIS — I10 Essential (primary) hypertension: Secondary | ICD-10-CM | POA: Diagnosis not present

## 2014-02-27 DIAGNOSIS — E119 Type 2 diabetes mellitus without complications: Secondary | ICD-10-CM | POA: Diagnosis not present

## 2014-02-27 DIAGNOSIS — Z9181 History of falling: Secondary | ICD-10-CM | POA: Diagnosis not present

## 2014-02-27 DIAGNOSIS — J449 Chronic obstructive pulmonary disease, unspecified: Secondary | ICD-10-CM | POA: Diagnosis not present

## 2014-02-27 DIAGNOSIS — F411 Generalized anxiety disorder: Secondary | ICD-10-CM | POA: Diagnosis not present

## 2014-02-27 DIAGNOSIS — F0390 Unspecified dementia without behavioral disturbance: Secondary | ICD-10-CM | POA: Diagnosis not present

## 2014-02-27 DIAGNOSIS — I10 Essential (primary) hypertension: Secondary | ICD-10-CM | POA: Diagnosis not present

## 2014-02-28 DIAGNOSIS — J449 Chronic obstructive pulmonary disease, unspecified: Secondary | ICD-10-CM | POA: Diagnosis not present

## 2014-02-28 DIAGNOSIS — F0390 Unspecified dementia without behavioral disturbance: Secondary | ICD-10-CM | POA: Diagnosis not present

## 2014-02-28 DIAGNOSIS — I10 Essential (primary) hypertension: Secondary | ICD-10-CM | POA: Diagnosis not present

## 2014-02-28 DIAGNOSIS — R32 Unspecified urinary incontinence: Secondary | ICD-10-CM | POA: Diagnosis not present

## 2014-02-28 DIAGNOSIS — Z9181 History of falling: Secondary | ICD-10-CM | POA: Diagnosis not present

## 2014-02-28 DIAGNOSIS — R3915 Urgency of urination: Secondary | ICD-10-CM | POA: Diagnosis not present

## 2014-02-28 DIAGNOSIS — E119 Type 2 diabetes mellitus without complications: Secondary | ICD-10-CM | POA: Diagnosis not present

## 2014-02-28 DIAGNOSIS — F411 Generalized anxiety disorder: Secondary | ICD-10-CM | POA: Diagnosis not present

## 2014-02-28 DIAGNOSIS — R3911 Hesitancy of micturition: Secondary | ICD-10-CM | POA: Diagnosis not present

## 2014-03-01 DIAGNOSIS — F411 Generalized anxiety disorder: Secondary | ICD-10-CM | POA: Diagnosis not present

## 2014-03-01 DIAGNOSIS — I10 Essential (primary) hypertension: Secondary | ICD-10-CM | POA: Diagnosis not present

## 2014-03-01 DIAGNOSIS — E119 Type 2 diabetes mellitus without complications: Secondary | ICD-10-CM | POA: Diagnosis not present

## 2014-03-01 DIAGNOSIS — F0390 Unspecified dementia without behavioral disturbance: Secondary | ICD-10-CM | POA: Diagnosis not present

## 2014-03-01 DIAGNOSIS — Z9181 History of falling: Secondary | ICD-10-CM | POA: Diagnosis not present

## 2014-03-01 DIAGNOSIS — J449 Chronic obstructive pulmonary disease, unspecified: Secondary | ICD-10-CM | POA: Diagnosis not present

## 2014-03-02 DIAGNOSIS — F411 Generalized anxiety disorder: Secondary | ICD-10-CM | POA: Diagnosis not present

## 2014-03-02 DIAGNOSIS — Z9181 History of falling: Secondary | ICD-10-CM | POA: Diagnosis not present

## 2014-03-02 DIAGNOSIS — I10 Essential (primary) hypertension: Secondary | ICD-10-CM | POA: Diagnosis not present

## 2014-03-02 DIAGNOSIS — J449 Chronic obstructive pulmonary disease, unspecified: Secondary | ICD-10-CM | POA: Diagnosis not present

## 2014-03-02 DIAGNOSIS — E119 Type 2 diabetes mellitus without complications: Secondary | ICD-10-CM | POA: Diagnosis not present

## 2014-03-02 DIAGNOSIS — F0390 Unspecified dementia without behavioral disturbance: Secondary | ICD-10-CM | POA: Diagnosis not present

## 2014-03-05 DIAGNOSIS — F0390 Unspecified dementia without behavioral disturbance: Secondary | ICD-10-CM | POA: Diagnosis not present

## 2014-03-05 DIAGNOSIS — E119 Type 2 diabetes mellitus without complications: Secondary | ICD-10-CM | POA: Diagnosis not present

## 2014-03-05 DIAGNOSIS — F411 Generalized anxiety disorder: Secondary | ICD-10-CM | POA: Diagnosis not present

## 2014-03-05 DIAGNOSIS — J449 Chronic obstructive pulmonary disease, unspecified: Secondary | ICD-10-CM | POA: Diagnosis not present

## 2014-03-05 DIAGNOSIS — Z9181 History of falling: Secondary | ICD-10-CM | POA: Diagnosis not present

## 2014-03-05 DIAGNOSIS — I10 Essential (primary) hypertension: Secondary | ICD-10-CM | POA: Diagnosis not present

## 2014-03-06 ENCOUNTER — Other Ambulatory Visit: Payer: Self-pay | Admitting: Internal Medicine

## 2014-03-06 DIAGNOSIS — IMO0001 Reserved for inherently not codable concepts without codable children: Secondary | ICD-10-CM | POA: Diagnosis not present

## 2014-03-06 DIAGNOSIS — R911 Solitary pulmonary nodule: Secondary | ICD-10-CM

## 2014-03-06 DIAGNOSIS — E876 Hypokalemia: Secondary | ICD-10-CM | POA: Diagnosis not present

## 2014-03-06 DIAGNOSIS — I10 Essential (primary) hypertension: Secondary | ICD-10-CM | POA: Diagnosis not present

## 2014-03-06 DIAGNOSIS — E119 Type 2 diabetes mellitus without complications: Secondary | ICD-10-CM | POA: Diagnosis not present

## 2014-03-06 DIAGNOSIS — N390 Urinary tract infection, site not specified: Secondary | ICD-10-CM | POA: Diagnosis not present

## 2014-03-06 DIAGNOSIS — F0390 Unspecified dementia without behavioral disturbance: Secondary | ICD-10-CM | POA: Diagnosis not present

## 2014-03-06 DIAGNOSIS — E538 Deficiency of other specified B group vitamins: Secondary | ICD-10-CM | POA: Diagnosis not present

## 2014-03-07 DIAGNOSIS — E119 Type 2 diabetes mellitus without complications: Secondary | ICD-10-CM | POA: Diagnosis not present

## 2014-03-07 DIAGNOSIS — Z9181 History of falling: Secondary | ICD-10-CM | POA: Diagnosis not present

## 2014-03-07 DIAGNOSIS — F0390 Unspecified dementia without behavioral disturbance: Secondary | ICD-10-CM | POA: Diagnosis not present

## 2014-03-07 DIAGNOSIS — I10 Essential (primary) hypertension: Secondary | ICD-10-CM | POA: Diagnosis not present

## 2014-03-07 DIAGNOSIS — J449 Chronic obstructive pulmonary disease, unspecified: Secondary | ICD-10-CM | POA: Diagnosis not present

## 2014-03-07 DIAGNOSIS — F411 Generalized anxiety disorder: Secondary | ICD-10-CM | POA: Diagnosis not present

## 2014-03-09 DIAGNOSIS — F411 Generalized anxiety disorder: Secondary | ICD-10-CM | POA: Diagnosis not present

## 2014-03-09 DIAGNOSIS — F0390 Unspecified dementia without behavioral disturbance: Secondary | ICD-10-CM | POA: Diagnosis not present

## 2014-03-09 DIAGNOSIS — Z9181 History of falling: Secondary | ICD-10-CM | POA: Diagnosis not present

## 2014-03-09 DIAGNOSIS — E119 Type 2 diabetes mellitus without complications: Secondary | ICD-10-CM | POA: Diagnosis not present

## 2014-03-09 DIAGNOSIS — J449 Chronic obstructive pulmonary disease, unspecified: Secondary | ICD-10-CM | POA: Diagnosis not present

## 2014-03-09 DIAGNOSIS — I10 Essential (primary) hypertension: Secondary | ICD-10-CM | POA: Diagnosis not present

## 2014-03-12 DIAGNOSIS — J449 Chronic obstructive pulmonary disease, unspecified: Secondary | ICD-10-CM | POA: Diagnosis not present

## 2014-03-12 DIAGNOSIS — E119 Type 2 diabetes mellitus without complications: Secondary | ICD-10-CM | POA: Diagnosis not present

## 2014-03-12 DIAGNOSIS — F411 Generalized anxiety disorder: Secondary | ICD-10-CM | POA: Diagnosis not present

## 2014-03-12 DIAGNOSIS — Z9181 History of falling: Secondary | ICD-10-CM | POA: Diagnosis not present

## 2014-03-12 DIAGNOSIS — F0390 Unspecified dementia without behavioral disturbance: Secondary | ICD-10-CM | POA: Diagnosis not present

## 2014-03-12 DIAGNOSIS — I10 Essential (primary) hypertension: Secondary | ICD-10-CM | POA: Diagnosis not present

## 2014-03-13 DIAGNOSIS — I10 Essential (primary) hypertension: Secondary | ICD-10-CM | POA: Diagnosis not present

## 2014-03-13 DIAGNOSIS — E119 Type 2 diabetes mellitus without complications: Secondary | ICD-10-CM | POA: Diagnosis not present

## 2014-03-13 DIAGNOSIS — J449 Chronic obstructive pulmonary disease, unspecified: Secondary | ICD-10-CM | POA: Diagnosis not present

## 2014-03-13 DIAGNOSIS — F0390 Unspecified dementia without behavioral disturbance: Secondary | ICD-10-CM | POA: Diagnosis not present

## 2014-03-13 DIAGNOSIS — Z9181 History of falling: Secondary | ICD-10-CM | POA: Diagnosis not present

## 2014-03-13 DIAGNOSIS — F411 Generalized anxiety disorder: Secondary | ICD-10-CM | POA: Diagnosis not present

## 2014-03-14 DIAGNOSIS — F411 Generalized anxiety disorder: Secondary | ICD-10-CM | POA: Diagnosis not present

## 2014-03-14 DIAGNOSIS — E119 Type 2 diabetes mellitus without complications: Secondary | ICD-10-CM | POA: Diagnosis not present

## 2014-03-14 DIAGNOSIS — F0390 Unspecified dementia without behavioral disturbance: Secondary | ICD-10-CM | POA: Diagnosis not present

## 2014-03-14 DIAGNOSIS — I10 Essential (primary) hypertension: Secondary | ICD-10-CM | POA: Diagnosis not present

## 2014-03-14 DIAGNOSIS — J449 Chronic obstructive pulmonary disease, unspecified: Secondary | ICD-10-CM | POA: Diagnosis not present

## 2014-03-14 DIAGNOSIS — Z9181 History of falling: Secondary | ICD-10-CM | POA: Diagnosis not present

## 2014-03-15 DIAGNOSIS — J449 Chronic obstructive pulmonary disease, unspecified: Secondary | ICD-10-CM | POA: Diagnosis not present

## 2014-03-15 DIAGNOSIS — E119 Type 2 diabetes mellitus without complications: Secondary | ICD-10-CM | POA: Diagnosis not present

## 2014-03-15 DIAGNOSIS — I10 Essential (primary) hypertension: Secondary | ICD-10-CM | POA: Diagnosis not present

## 2014-03-15 DIAGNOSIS — F0390 Unspecified dementia without behavioral disturbance: Secondary | ICD-10-CM | POA: Diagnosis not present

## 2014-03-15 DIAGNOSIS — F411 Generalized anxiety disorder: Secondary | ICD-10-CM | POA: Diagnosis not present

## 2014-03-15 DIAGNOSIS — Z9181 History of falling: Secondary | ICD-10-CM | POA: Diagnosis not present

## 2014-03-20 ENCOUNTER — Inpatient Hospital Stay: Admission: RE | Admit: 2014-03-20 | Payer: Medicare Other | Source: Ambulatory Visit

## 2014-03-20 DIAGNOSIS — F0390 Unspecified dementia without behavioral disturbance: Secondary | ICD-10-CM | POA: Diagnosis not present

## 2014-03-20 DIAGNOSIS — Z9181 History of falling: Secondary | ICD-10-CM | POA: Diagnosis not present

## 2014-03-20 DIAGNOSIS — E119 Type 2 diabetes mellitus without complications: Secondary | ICD-10-CM | POA: Diagnosis not present

## 2014-03-20 DIAGNOSIS — F411 Generalized anxiety disorder: Secondary | ICD-10-CM | POA: Diagnosis not present

## 2014-03-20 DIAGNOSIS — I10 Essential (primary) hypertension: Secondary | ICD-10-CM | POA: Diagnosis not present

## 2014-03-20 DIAGNOSIS — J449 Chronic obstructive pulmonary disease, unspecified: Secondary | ICD-10-CM | POA: Diagnosis not present

## 2014-03-21 ENCOUNTER — Encounter (HOSPITAL_COMMUNITY): Payer: Self-pay | Admitting: Emergency Medicine

## 2014-03-21 ENCOUNTER — Inpatient Hospital Stay (HOSPITAL_COMMUNITY)
Admission: EM | Admit: 2014-03-21 | Discharge: 2014-03-30 | DRG: 469 | Disposition: A | Discharge status: Skilled Nursing Facility | Payer: Medicare Other | Attending: Internal Medicine | Admitting: Internal Medicine

## 2014-03-21 ENCOUNTER — Emergency Department (HOSPITAL_COMMUNITY): Payer: Medicare Other

## 2014-03-21 DIAGNOSIS — Z801 Family history of malignant neoplasm of trachea, bronchus and lung: Secondary | ICD-10-CM

## 2014-03-21 DIAGNOSIS — E872 Acidosis, unspecified: Secondary | ICD-10-CM | POA: Diagnosis present

## 2014-03-21 DIAGNOSIS — Z8051 Family history of malignant neoplasm of kidney: Secondary | ICD-10-CM

## 2014-03-21 DIAGNOSIS — Z471 Aftercare following joint replacement surgery: Secondary | ICD-10-CM | POA: Diagnosis not present

## 2014-03-21 DIAGNOSIS — M25519 Pain in unspecified shoulder: Secondary | ICD-10-CM | POA: Diagnosis not present

## 2014-03-21 DIAGNOSIS — Z9010 Acquired absence of unspecified breast and nipple: Secondary | ICD-10-CM

## 2014-03-21 DIAGNOSIS — Z8249 Family history of ischemic heart disease and other diseases of the circulatory system: Secondary | ICD-10-CM | POA: Diagnosis not present

## 2014-03-21 DIAGNOSIS — Z87891 Personal history of nicotine dependence: Secondary | ICD-10-CM | POA: Diagnosis not present

## 2014-03-21 DIAGNOSIS — R0609 Other forms of dyspnea: Secondary | ICD-10-CM | POA: Diagnosis not present

## 2014-03-21 DIAGNOSIS — G929 Unspecified toxic encephalopathy: Secondary | ICD-10-CM | POA: Diagnosis not present

## 2014-03-21 DIAGNOSIS — F411 Generalized anxiety disorder: Secondary | ICD-10-CM | POA: Diagnosis present

## 2014-03-21 DIAGNOSIS — I509 Heart failure, unspecified: Secondary | ICD-10-CM

## 2014-03-21 DIAGNOSIS — Z880 Allergy status to penicillin: Secondary | ICD-10-CM

## 2014-03-21 DIAGNOSIS — Z853 Personal history of malignant neoplasm of breast: Secondary | ICD-10-CM | POA: Diagnosis not present

## 2014-03-21 DIAGNOSIS — Z9181 History of falling: Secondary | ICD-10-CM

## 2014-03-21 DIAGNOSIS — E119 Type 2 diabetes mellitus without complications: Secondary | ICD-10-CM | POA: Diagnosis present

## 2014-03-21 DIAGNOSIS — F0390 Unspecified dementia without behavioral disturbance: Secondary | ICD-10-CM | POA: Diagnosis present

## 2014-03-21 DIAGNOSIS — I471 Supraventricular tachycardia: Secondary | ICD-10-CM | POA: Diagnosis not present

## 2014-03-21 DIAGNOSIS — N390 Urinary tract infection, site not specified: Secondary | ICD-10-CM | POA: Diagnosis present

## 2014-03-21 DIAGNOSIS — S72009A Fracture of unspecified part of neck of unspecified femur, initial encounter for closed fracture: Secondary | ICD-10-CM | POA: Diagnosis present

## 2014-03-21 DIAGNOSIS — I5031 Acute diastolic (congestive) heart failure: Secondary | ICD-10-CM

## 2014-03-21 DIAGNOSIS — I779 Disorder of arteries and arterioles, unspecified: Secondary | ICD-10-CM | POA: Diagnosis present

## 2014-03-21 DIAGNOSIS — S72001A Fracture of unspecified part of neck of right femur, initial encounter for closed fracture: Secondary | ICD-10-CM

## 2014-03-21 DIAGNOSIS — Z7902 Long term (current) use of antithrombotics/antiplatelets: Secondary | ICD-10-CM

## 2014-03-21 DIAGNOSIS — E1149 Type 2 diabetes mellitus with other diabetic neurological complication: Secondary | ICD-10-CM | POA: Diagnosis present

## 2014-03-21 DIAGNOSIS — S4980XA Other specified injuries of shoulder and upper arm, unspecified arm, initial encounter: Secondary | ICD-10-CM | POA: Diagnosis not present

## 2014-03-21 DIAGNOSIS — I5032 Chronic diastolic (congestive) heart failure: Secondary | ICD-10-CM | POA: Diagnosis not present

## 2014-03-21 DIAGNOSIS — G92 Toxic encephalopathy: Secondary | ICD-10-CM | POA: Diagnosis present

## 2014-03-21 DIAGNOSIS — D62 Acute posthemorrhagic anemia: Secondary | ICD-10-CM | POA: Diagnosis not present

## 2014-03-21 DIAGNOSIS — F329 Major depressive disorder, single episode, unspecified: Secondary | ICD-10-CM | POA: Diagnosis present

## 2014-03-21 DIAGNOSIS — S298XXA Other specified injuries of thorax, initial encounter: Secondary | ICD-10-CM | POA: Diagnosis not present

## 2014-03-21 DIAGNOSIS — S79919A Unspecified injury of unspecified hip, initial encounter: Secondary | ICD-10-CM | POA: Diagnosis not present

## 2014-03-21 DIAGNOSIS — J811 Chronic pulmonary edema: Secondary | ICD-10-CM

## 2014-03-21 DIAGNOSIS — I2510 Atherosclerotic heart disease of native coronary artery without angina pectoris: Secondary | ICD-10-CM

## 2014-03-21 DIAGNOSIS — Z01810 Encounter for preprocedural cardiovascular examination: Secondary | ICD-10-CM | POA: Diagnosis not present

## 2014-03-21 DIAGNOSIS — E1142 Type 2 diabetes mellitus with diabetic polyneuropathy: Secondary | ICD-10-CM | POA: Diagnosis present

## 2014-03-21 DIAGNOSIS — IMO0001 Reserved for inherently not codable concepts without codable children: Secondary | ICD-10-CM

## 2014-03-21 DIAGNOSIS — R0989 Other specified symptoms and signs involving the circulatory and respiratory systems: Secondary | ICD-10-CM | POA: Diagnosis not present

## 2014-03-21 DIAGNOSIS — F3289 Other specified depressive episodes: Secondary | ICD-10-CM | POA: Diagnosis present

## 2014-03-21 DIAGNOSIS — E1165 Type 2 diabetes mellitus with hyperglycemia: Secondary | ICD-10-CM

## 2014-03-21 DIAGNOSIS — I5033 Acute on chronic diastolic (congestive) heart failure: Secondary | ICD-10-CM | POA: Diagnosis not present

## 2014-03-21 DIAGNOSIS — Z66 Do not resuscitate: Secondary | ICD-10-CM | POA: Diagnosis present

## 2014-03-21 DIAGNOSIS — Z96649 Presence of unspecified artificial hip joint: Secondary | ICD-10-CM | POA: Diagnosis not present

## 2014-03-21 DIAGNOSIS — R262 Difficulty in walking, not elsewhere classified: Secondary | ICD-10-CM | POA: Diagnosis not present

## 2014-03-21 DIAGNOSIS — E785 Hyperlipidemia, unspecified: Secondary | ICD-10-CM | POA: Diagnosis present

## 2014-03-21 DIAGNOSIS — M6281 Muscle weakness (generalized): Secondary | ICD-10-CM | POA: Diagnosis not present

## 2014-03-21 DIAGNOSIS — T40605A Adverse effect of unspecified narcotics, initial encounter: Secondary | ICD-10-CM | POA: Diagnosis present

## 2014-03-21 DIAGNOSIS — M25559 Pain in unspecified hip: Secondary | ICD-10-CM | POA: Diagnosis not present

## 2014-03-21 DIAGNOSIS — M259 Joint disorder, unspecified: Secondary | ICD-10-CM | POA: Diagnosis not present

## 2014-03-21 DIAGNOSIS — R41841 Cognitive communication deficit: Secondary | ICD-10-CM | POA: Diagnosis not present

## 2014-03-21 DIAGNOSIS — R6889 Other general symptoms and signs: Secondary | ICD-10-CM | POA: Diagnosis not present

## 2014-03-21 DIAGNOSIS — W19XXXA Unspecified fall, initial encounter: Secondary | ICD-10-CM | POA: Diagnosis not present

## 2014-03-21 DIAGNOSIS — J9600 Acute respiratory failure, unspecified whether with hypoxia or hypercapnia: Secondary | ICD-10-CM | POA: Diagnosis not present

## 2014-03-21 DIAGNOSIS — R0600 Dyspnea, unspecified: Secondary | ICD-10-CM

## 2014-03-21 DIAGNOSIS — I10 Essential (primary) hypertension: Secondary | ICD-10-CM | POA: Diagnosis not present

## 2014-03-21 DIAGNOSIS — R279 Unspecified lack of coordination: Secondary | ICD-10-CM | POA: Diagnosis not present

## 2014-03-21 DIAGNOSIS — T428X5A Adverse effect of antiparkinsonism drugs and other central muscle-tone depressants, initial encounter: Secondary | ICD-10-CM | POA: Diagnosis present

## 2014-03-21 DIAGNOSIS — S46909A Unspecified injury of unspecified muscle, fascia and tendon at shoulder and upper arm level, unspecified arm, initial encounter: Secondary | ICD-10-CM | POA: Diagnosis not present

## 2014-03-21 DIAGNOSIS — Z794 Long term (current) use of insulin: Secondary | ICD-10-CM

## 2014-03-21 DIAGNOSIS — Y92009 Unspecified place in unspecified non-institutional (private) residence as the place of occurrence of the external cause: Secondary | ICD-10-CM

## 2014-03-21 HISTORY — DX: Type 2 diabetes mellitus without complications: E11.9

## 2014-03-21 HISTORY — DX: Supraventricular tachycardia: I47.1

## 2014-03-21 HISTORY — DX: Personal history of other diseases of the digestive system: Z87.19

## 2014-03-21 HISTORY — DX: Essential (primary) hypertension: I10

## 2014-03-21 HISTORY — DX: Acute diastolic (congestive) heart failure: I50.31

## 2014-03-21 HISTORY — DX: Personal history of malignant neoplasm of breast: Z85.3

## 2014-03-21 HISTORY — DX: Anxiety disorder, unspecified: F41.9

## 2014-03-21 HISTORY — DX: Atherosclerotic heart disease of native coronary artery without angina pectoris: I25.10

## 2014-03-21 HISTORY — DX: Supraventricular tachycardia, unspecified: I47.10

## 2014-03-21 LAB — URINALYSIS, ROUTINE W REFLEX MICROSCOPIC
Bilirubin Urine: NEGATIVE
Glucose, UA: NEGATIVE mg/dL
Ketones, ur: 15 mg/dL — AB
Leukocytes, UA: NEGATIVE
Nitrite: NEGATIVE
Protein, ur: 100 mg/dL — AB
Specific Gravity, Urine: 1.025 (ref 1.005–1.030)
Urobilinogen, UA: 0.2 mg/dL (ref 0.0–1.0)
pH: 5.5 (ref 5.0–8.0)

## 2014-03-21 LAB — URINE MICROSCOPIC-ADD ON

## 2014-03-21 LAB — COMPREHENSIVE METABOLIC PANEL
ALT: 18 U/L (ref 0–35)
AST: 28 U/L (ref 0–37)
Albumin: 3.9 g/dL (ref 3.5–5.2)
Alkaline Phosphatase: 152 U/L — ABNORMAL HIGH (ref 39–117)
BUN: 15 mg/dL (ref 6–23)
CO2: 16 mEq/L — ABNORMAL LOW (ref 19–32)
Calcium: 9.6 mg/dL (ref 8.4–10.5)
Chloride: 99 mEq/L (ref 96–112)
Creatinine, Ser: 0.61 mg/dL (ref 0.50–1.10)
GFR calc Af Amer: >90 mL/min (ref >90)
GFR calc non Af Amer: 83 mL/min — ABNORMAL LOW (ref >90)
Glucose, Bld: 216 mg/dL — ABNORMAL HIGH (ref 70–99)
Potassium: 3.2 mEq/L — ABNORMAL LOW (ref 3.7–5.3)
Sodium: 139 mEq/L (ref 137–147)
Total Bilirubin: 0.9 mg/dL (ref 0.3–1.2)
Total Protein: 7.8 g/dL (ref 6.0–8.3)

## 2014-03-21 LAB — PRO B NATRIURETIC PEPTIDE: Pro B Natriuretic peptide (BNP): 556.1 pg/mL — ABNORMAL HIGH (ref 0–450)

## 2014-03-21 LAB — CBC WITH DIFFERENTIAL/PLATELET
Basophils Absolute: 0 10*3/uL (ref 0.0–0.1)
Basophils Relative: 0 % (ref 0–1)
Eosinophils Absolute: 0.1 10*3/uL (ref 0.0–0.7)
Eosinophils Relative: 0 % (ref 0–5)
HCT: 40.5 % (ref 36.0–46.0)
Hemoglobin: 14.3 g/dL (ref 12.0–15.0)
Lymphocytes Relative: 21 % (ref 12–46)
Lymphs Abs: 3.1 10*3/uL (ref 0.7–4.0)
MCH: 34.7 pg — ABNORMAL HIGH (ref 26.0–34.0)
MCHC: 35.3 g/dL (ref 30.0–36.0)
MCV: 98.3 fL (ref 78.0–100.0)
Monocytes Absolute: 0.6 10*3/uL (ref 0.1–1.0)
Monocytes Relative: 4 % (ref 3–12)
Neutro Abs: 11.4 10*3/uL — ABNORMAL HIGH (ref 1.7–7.7)
Neutrophils Relative %: 75 % (ref 43–77)
Platelets: 330 10*3/uL (ref 150–400)
RBC: 4.12 MIL/uL (ref 3.87–5.11)
RDW: 13.7 % (ref 11.5–15.5)
WBC: 15.2 10*3/uL — ABNORMAL HIGH (ref 4.0–10.5)

## 2014-03-21 LAB — BASIC METABOLIC PANEL
BUN: 15 mg/dL (ref 6–23)
CO2: 18 mEq/L — ABNORMAL LOW (ref 19–32)
Calcium: 9.5 mg/dL (ref 8.4–10.5)
Chloride: 99 mEq/L (ref 96–112)
Creatinine, Ser: 0.67 mg/dL (ref 0.50–1.10)
GFR calc Af Amer: >90 mL/min (ref >90)
GFR calc non Af Amer: 80 mL/min — ABNORMAL LOW (ref >90)
Glucose, Bld: 243 mg/dL — ABNORMAL HIGH (ref 70–99)
Potassium: 3.6 mEq/L — ABNORMAL LOW (ref 3.7–5.3)
Sodium: 140 mEq/L (ref 137–147)

## 2014-03-21 LAB — GLUCOSE, CAPILLARY: Glucose-Capillary: 229 mg/dL — ABNORMAL HIGH (ref 70–99)

## 2014-03-21 LAB — TROPONIN I: Troponin I: <0.3 ng/mL (ref <0.30)

## 2014-03-21 LAB — LIPASE, BLOOD: Lipase: 23 U/L (ref 11–59)

## 2014-03-21 LAB — LACTIC ACID, PLASMA: Lactic Acid, Venous: 3.6 mmol/L — ABNORMAL HIGH (ref 0.5–2.2)

## 2014-03-21 MED ORDER — SPIRONOLACTONE 25 MG PO TABS
25.0000 mg | ORAL_TABLET | Freq: Every day | ORAL | Status: DC
  Administered 2014-03-23 – 2014-03-27 (×5): 25 mg via ORAL
  Filled 2014-03-21 (×5): qty 1

## 2014-03-21 MED ORDER — LISINOPRIL 10 MG PO TABS
20.0000 mg | ORAL_TABLET | Freq: Every day | ORAL | Status: DC
  Administered 2014-03-22 – 2014-03-27 (×6): 20 mg via ORAL
  Filled 2014-03-21 (×6): qty 2

## 2014-03-21 MED ORDER — INSULIN ASPART 100 UNIT/ML ~~LOC~~ SOLN
0.0000 [IU] | Freq: Three times a day (TID) | SUBCUTANEOUS | Status: DC
  Administered 2014-03-22 (×3): 3 [IU] via SUBCUTANEOUS
  Administered 2014-03-23: 2 [IU] via SUBCUTANEOUS
  Administered 2014-03-23: 3 [IU] via SUBCUTANEOUS
  Administered 2014-03-23: 2 [IU] via SUBCUTANEOUS
  Administered 2014-03-24 (×2): 3 [IU] via SUBCUTANEOUS
  Administered 2014-03-24: 2 [IU] via SUBCUTANEOUS
  Administered 2014-03-25: 5 [IU] via SUBCUTANEOUS
  Administered 2014-03-25: 3 [IU] via SUBCUTANEOUS
  Administered 2014-03-25: 2 [IU] via SUBCUTANEOUS
  Administered 2014-03-26 (×3): 5 [IU] via SUBCUTANEOUS

## 2014-03-21 MED ORDER — METHOCARBAMOL 1000 MG/10ML IJ SOLN
500.0000 mg | Freq: Four times a day (QID) | INTRAVENOUS | Status: DC | PRN
  Filled 2014-03-21: qty 5

## 2014-03-21 MED ORDER — SODIUM CHLORIDE 0.9 % IV BOLUS (SEPSIS)
250.0000 mL | Freq: Once | INTRAVENOUS | Status: AC
  Administered 2014-03-21: 250 mL via INTRAVENOUS

## 2014-03-21 MED ORDER — ENOXAPARIN SODIUM 30 MG/0.3ML ~~LOC~~ SOLN: 30.0000 mg | SUBCUTANEOUS | Status: DC

## 2014-03-21 MED ORDER — MORPHINE SULFATE 2 MG/ML IJ SOLN
0.5000 mg | INTRAMUSCULAR | Status: DC | PRN
  Administered 2014-03-22 – 2014-03-28 (×20): 0.5 mg via INTRAVENOUS
  Filled 2014-03-21 (×21): qty 1

## 2014-03-21 MED ORDER — METHOCARBAMOL 500 MG PO TABS
500.0000 mg | ORAL_TABLET | Freq: Four times a day (QID) | ORAL | Status: DC | PRN
  Administered 2014-03-22 – 2014-03-27 (×5): 500 mg via ORAL
  Filled 2014-03-21 (×5): qty 1

## 2014-03-21 MED ORDER — HYDROCODONE-ACETAMINOPHEN 5-325 MG PO TABS
1.0000 | ORAL_TABLET | Freq: Four times a day (QID) | ORAL | Status: DC | PRN
  Administered 2014-03-22 – 2014-03-25 (×6): 2 via ORAL
  Administered 2014-03-27 (×3): 1 via ORAL
  Administered 2014-03-28: 2 via ORAL
  Filled 2014-03-21 (×5): qty 2
  Filled 2014-03-21: qty 1
  Filled 2014-03-21: qty 2
  Filled 2014-03-21: qty 1
  Filled 2014-03-21 (×2): qty 2
  Filled 2014-03-21: qty 1
  Filled 2014-03-21: qty 2

## 2014-03-21 MED ORDER — METOPROLOL SUCCINATE ER 50 MG PO TB24
50.0000 mg | ORAL_TABLET | Freq: Every day | ORAL | Status: DC
  Administered 2014-03-22 – 2014-03-30 (×9): 50 mg via ORAL
  Filled 2014-03-21 (×9): qty 1

## 2014-03-21 MED ORDER — SODIUM CHLORIDE 0.9 % IV SOLN
INTRAVENOUS | Status: AC
  Administered 2014-03-21: 20:00:00 via INTRAVENOUS

## 2014-03-21 MED ORDER — POTASSIUM CHLORIDE CRYS ER 20 MEQ PO TBCR
40.0000 meq | EXTENDED_RELEASE_TABLET | ORAL | Status: AC
  Administered 2014-03-22: 40 meq via ORAL
  Filled 2014-03-21: qty 4

## 2014-03-21 MED ORDER — LEVOFLOXACIN IN D5W 250 MG/50ML IV SOLN
INTRAVENOUS | Status: AC
  Filled 2014-03-21: qty 50

## 2014-03-21 MED ORDER — ENOXAPARIN SODIUM 40 MG/0.4ML ~~LOC~~ SOLN
40.0000 mg | Freq: Every day | SUBCUTANEOUS | Status: DC
  Administered 2014-03-22 – 2014-03-26 (×6): 40 mg via SUBCUTANEOUS
  Filled 2014-03-21 (×6): qty 0.4

## 2014-03-21 MED ORDER — ALPRAZOLAM 0.25 MG PO TABS
0.2500 mg | ORAL_TABLET | Freq: Two times a day (BID) | ORAL | Status: DC
  Administered 2014-03-22 – 2014-03-28 (×14): 0.25 mg via ORAL
  Filled 2014-03-21 (×15): qty 1

## 2014-03-21 MED ORDER — FUROSEMIDE 10 MG/ML IJ SOLN
40.0000 mg | Freq: Once | INTRAMUSCULAR | Status: AC
  Administered 2014-03-21: 40 mg via INTRAVENOUS
  Filled 2014-03-21: qty 4

## 2014-03-21 MED ORDER — ATORVASTATIN CALCIUM 10 MG PO TABS
10.0000 mg | ORAL_TABLET | Freq: Every day | ORAL | Status: DC
  Administered 2014-03-22 – 2014-03-27 (×6): 10 mg via ORAL
  Filled 2014-03-21 (×7): qty 1

## 2014-03-21 MED ORDER — LEVOFLOXACIN IN D5W 250 MG/50ML IV SOLN
250.0000 mg | INTRAVENOUS | Status: DC
  Administered 2014-03-22: 250 mg via INTRAVENOUS
  Filled 2014-03-21 (×2): qty 50

## 2014-03-21 MED ORDER — ONDANSETRON HCL 4 MG/2ML IJ SOLN
4.0000 mg | Freq: Once | INTRAMUSCULAR | Status: AC
  Administered 2014-03-21: 4 mg via INTRAVENOUS
  Filled 2014-03-21: qty 2

## 2014-03-21 MED ORDER — MIRTAZAPINE 30 MG PO TABS
30.0000 mg | ORAL_TABLET | Freq: Every day | ORAL | Status: DC
  Administered 2014-03-22 – 2014-03-29 (×9): 30 mg via ORAL
  Filled 2014-03-21 (×9): qty 1

## 2014-03-21 MED ORDER — INSULIN GLARGINE 100 UNIT/ML ~~LOC~~ SOLN
10.0000 [IU] | Freq: Every day | SUBCUTANEOUS | Status: DC
  Administered 2014-03-22 – 2014-03-25 (×5): 10 [IU] via SUBCUTANEOUS
  Filled 2014-03-21 (×6): qty 0.1

## 2014-03-21 MED ORDER — SODIUM CHLORIDE 0.9 % IV SOLN: INTRAVENOUS | Status: DC

## 2014-03-21 MED ORDER — HYDROMORPHONE HCL PF 1 MG/ML IJ SOLN
1.0000 mg | Freq: Once | INTRAMUSCULAR | Status: AC
  Administered 2014-03-21: 1 mg via INTRAVENOUS
  Filled 2014-03-21: qty 1

## 2014-03-21 MED ORDER — DOCUSATE SODIUM 100 MG PO CAPS
100.0000 mg | ORAL_CAPSULE | Freq: Every day | ORAL | Status: DC
  Administered 2014-03-22 – 2014-03-28 (×8): 100 mg via ORAL
  Filled 2014-03-21 (×8): qty 1

## 2014-03-21 NOTE — H&P (Signed)
PCP:   [name], MD   Chief Complaint:   Fall  HPI: 80 year old female who   has a past medical history of Hemorrhage of gastrointestinal tract, unspecified; Other malaise and fatigue; Tobacco use disorder; Parotid tumor; Neoplasm of malignant; Diabetes mellitus; Osteopenia; Hypertension; Hyperlipidemia; Depression; Anxiety state, unspecified; Chronic headaches; Carotid artery disease; and Orthostatic hypotension. Was brought to the ED after patient fell in the bathroom. As per patient she went to the toilet and was about to clean herself when she lost her balance and fell. She denies loss of consciousness, no headache no chest pain. Patient says that after falling she had sudden onset of pain in the right hip. She also has some shortness of breath, patient was brought to the ED . In the ED patient was found to be mildly hypoxic, with O2 sats 88% on room air . Chest x-ray showed pulmonary edema and vascular congestion , BNP elevated to 556. Last 2-D echocardiogram showed grade 1 diastolic dysfunction.   also patient found to have displaced right femoral neck fracture without dislocation. Orthopedics been consulted by ED physician. She received a dose of Lasix 40 mg IV x1, and has diuresed about 800 cc of urine. Patient also has been diagnosed with UTI recently, and has been taking Macrobid twice a day, despite taking Macrobid patient continued to have symptoms of dysuria and intermittent fevers. She has elevated white count of 15,000.  Allergies:   Allergies  Allergen Reactions  . Aricept [Donepezil Hcl]     Hives, vomiting and headache  . Namenda [Memantine Hcl]     hives  . Codeine   . Latex   . Penicillins Rash      Past Medical History  Diagnosis Date  . Hemorrhage of gastrointestinal tract, unspecified   . Other malaise and fatigue   . Tobacco use disorder   . Parotid tumor   . Neoplasm of malignant     breast  ,HX   . Diabetes mellitus   . Osteopenia   . Hypertension    . Hyperlipidemia   . Depression   . Anxiety state, unspecified   . Chronic headaches   . Carotid artery disease   . Orthostatic hypotension     Past Surgical History  Procedure Laterality Date  . Mastectomy  1994    left breast -related to breast cancer  . Vesicovaginal fistula closure w/ tah  1979  . Cholecystectomy  2008  . Hemorrhoid surgery  1960s  . Laparoscopic appendectomy N/A 04/13/2013    Procedure: APPENDECTOMY LAPAROSCOPIC;  Surgeon: Donato Heinz, MD;  Location: [facility];  Service: General;  Laterality: N/A;  . Colonoscopy  2010    Dr. Oneida Alar: single tubular adenoma, one hyperplastic polyp. Next TCS 2020 health permitting.    Prior to Admission medications   Medication Sig Start Date End Date Taking? Authorizing Provider  ALPRAZolam (XANAX) 0.25 MG tablet Take 0.25 mg by mouth 2 (two) times daily.   Yes Historical Provider, MD  atorvastatin (LIPITOR) 10 MG tablet Take 1 tablet (10 mg total) by mouth daily. 02/20/14  Yes [name], MD  cholecalciferol (VITAMIN D) 1000 UNITS tablet Take 1,000 Units by mouth every morning.    Yes Historical Provider, MD  clopidogrel (PLAVIX) 75 MG tablet Take 1 tablet (75 mg total) by mouth daily with breakfast. 02/20/14  Yes [name], MD  docusate sodium (COLACE) 100 MG capsule Take 100 mg by mouth at bedtime.    Yes Historical Provider, MD  esomeprazole (NEXIUM) 40 MG capsule Take 40 mg by mouth at bedtime.    Yes Historical Provider, MD  glyBURIDE-metformin (GLUCOVANCE) 5-500 MG per tablet Take 2 tablets by mouth 2 (two) times daily with a meal.    Yes Historical Provider, MD  insulin aspart (NOVOLOG) 100 UNIT/ML injection Inject 3 Units into the skin daily after supper.    Yes Historical Provider, MD  insulin glargine (LANTUS) 100 UNIT/ML injection Inject 3-8 Units into the skin at bedtime. Give if BS 140-150= 3 units, 151-160=4 units, 161-170=5 units, 171-180= 6 units, >181=7units   Yes Historical Provider, MD  lisinopril  (PRINIVIL,ZESTRIL) 20 MG tablet Take 1 tablet (20 mg total) by mouth daily. 06/27/13  Yes [name], MD  Methylfol-Methylcob-Acetylcyst (CEREFOLIN NAC PO) Take 1 tablet by mouth daily.   Yes Historical Provider, MD  metoprolol succinate (TOPROL-XL) 50 MG 24 hr tablet Take 1 tablet (50 mg total) by mouth daily. Take with or immediately following a meal. 06/27/13  Yes [name], MD  mirtazapine (REMERON) 30 MG tablet Take 30 mg by mouth at bedtime.   Yes Historical Provider, MD  nitrofurantoin, macrocrystal-monohydrate, (MACROBID) 100 MG capsule Take 100 mg by mouth 2 (two) times daily. For 7 days(started 03/16/14)   Yes Historical Provider, MD  spironolactone (ALDACTONE) 25 MG tablet Take 25 mg by mouth daily.   Yes Historical Provider, MD  tiotropium (SPIRIVA) 18 MCG inhalation capsule Place 18 mcg into inhaler and inhale daily as needed (shortness of breath).   Yes Historical Provider, MD    Social History:  reports that she quit smoking about a year ago. Her smoking use included Cigarettes. She has a 20 pack-year smoking history. She does not have any smokeless tobacco history on file. She reports that she does not drink alcohol or use illicit drugs.  Family History  Problem Relation Age of Onset  . Heart failure Mother     CHF  . Cirrhosis Brother   . Lung cancer Father     brain mets  . Kidney cancer Mother   . Kidney cancer Sister      All the positives are listed in BOLD  Review of Systems:  HEENT: Headache, blurred vision, runny nose, sore throat Neck: Hypothyroidism, hyperthyroidism,,lymphadenopathy Chest : Shortness of breath, history of COPD, Asthma Heart : Chest pain, history of coronary arterey disease GI:  Nausea, vomiting, diarrhea, constipation, GERD GU: Dysuria, urgency, frequency of urination, hematuria Neuro: Stroke, seizures, syncope Psych: Depression, anxiety, hallucinations   Physical Exam: Blood pressure 161/84, pulse 99, temperature 99.2 F (37.3 C),  temperature source Oral, resp. rate 22, height _0  (1.626 m), SpO2 93.00%. Constitutional:   Patient is a well-developed and well-nourished female in no acute distress and cooperative with exam. Head: Normocephalic and atraumatic Mouth: Mucus membranes moist Eyes: PERRL, EOMI, conjunctivae normal Neck: Supple, No Thyromegaly Cardiovascular: RRR, S1 normal, S2 normal Pulmonary/Chest: CTAB, no wheezes, rales, or rhonchi Abdominal: Soft. Non-tender, non-distended, bowel sounds are normal, no masses, organomegaly, or guarding present.  Neurological: A&O x3, Strenght is normal and symmetric bilaterally, cranial nerve II-XII are grossly intact, no focal motor deficit, sensory intact to light touch bilaterally.  Extremities : Right lower extremity is externally rotated, tender in right hip on movement   Labs on Admission:  Results for orders placed during the hospital encounter of 03/21/14 (from the past 48 hour(s))  URINALYSIS, ROUTINE W REFLEX MICROSCOPIC     Status: Abnormal   Collection Time    03/21/14  5:35 PM  Result Value Ref Range   Color, Urine YELLOW  YELLOW   APPearance CLEAR  CLEAR   Specific Gravity, Urine 1.025  1.005 - 1.030   pH 5.5  5.0 - 8.0   Glucose, UA NEGATIVE  NEGATIVE mg/dL   Hgb urine dipstick TRACE (*) NEGATIVE   Bilirubin Urine NEGATIVE  NEGATIVE   Ketones, ur 15 (*) NEGATIVE mg/dL   Protein, ur 100 (*) NEGATIVE mg/dL   Urobilinogen, UA 0.2  0.0 - 1.0 mg/dL   Nitrite NEGATIVE  NEGATIVE   Leukocytes, UA NEGATIVE  NEGATIVE  URINE MICROSCOPIC-ADD ON     Status: Abnormal   Collection Time    03/21/14  5:35 PM      Result Value Ref Range   Squamous Epithelial / LPF RARE  RARE   WBC, UA 0-2  <3 WBC/hpf   RBC / HPF 3-6  <3 RBC/hpf   Bacteria, UA RARE  RARE   Casts HYALINE CASTS (*) NEGATIVE  COMPREHENSIVE METABOLIC PANEL     Status: Abnormal   Collection Time    03/21/14  5:41 PM      Result Value Ref Range   Sodium 139  137 - 147 mEq/L   Potassium 3.2  (*) 3.7 - 5.3 mEq/L   Chloride 99  96 - 112 mEq/L   CO2 16 (*) 19 - 32 mEq/L   Glucose, Bld 216 (*) 70 - 99 mg/dL   BUN 15  6 - 23 mg/dL   Creatinine, Ser 0.61  0.50 - 1.10 mg/dL   Calcium 9.6  8.4 - 10.5 mg/dL   Total Protein 7.8  6.0 - 8.3 g/dL   Albumin 3.9  3.5 - 5.2 g/dL   AST 28  0 - 37 U/L   ALT 18  0 - 35 U/L   Alkaline Phosphatase 152 (*) 39 - 117 U/L   Total Bilirubin 0.9  0.3 - 1.2 mg/dL   GFR calc non Af Amer 83 (*) >90 mL/min   GFR calc Af Amer >90  >90 mL/min   Comment: (NOTE)     The eGFR has been calculated using the CKD EPI equation.     This calculation has not been validated in all clinical situations.     eGFR's persistently <90 mL/min signify possible Chronic Kidney     Disease.  LIPASE, BLOOD     Status: None   Collection Time    03/21/14  5:41 PM      Result Value Ref Range   Lipase 23  11 - 59 U/L  CBC WITH DIFFERENTIAL     Status: Abnormal   Collection Time    03/21/14  5:41 PM      Result Value Ref Range   WBC 15.2 (*) 4.0 - 10.5 K/uL   RBC 4.12  3.87 - 5.11 MIL/uL   Hemoglobin 14.3  12.0 - 15.0 g/dL   HCT 40.5  36.0 - 46.0 %   MCV 98.3  78.0 - 100.0 fL   MCH 34.7 (*) 26.0 - 34.0 pg   MCHC 35.3  30.0 - 36.0 g/dL   RDW 13.7  11.5 - 15.5 %   Platelets 330  150 - 400 K/uL   Neutrophils Relative % 75  43 - 77 %   Neutro Abs 11.4 (*) 1.7 - 7.7 K/uL   Lymphocytes Relative 21  12 - 46 %   Lymphs Abs 3.1  0.7 - 4.0 K/uL   Monocytes Relative 4  3 - 12 %  Monocytes Absolute 0.6  0.1 - 1.0 K/uL   Eosinophils Relative 0  0 - 5 %   Eosinophils Absolute 0.1  0.0 - 0.7 K/uL   Basophils Relative 0  0 - 1 %   Basophils Absolute 0.0  0.0 - 0.1 K/uL  PRO B NATRIURETIC PEPTIDE     Status: Abnormal   Collection Time    03/21/14  5:41 PM      Result Value Ref Range   Pro B Natriuretic peptide (BNP) 556.1 (*) 0 - 450 pg/mL  TROPONIN I     Status: None   Collection Time    03/21/14  6:38 PM      Result Value Ref Range   Troponin I <0.30  <0.30 ng/mL    Comment:            Due to the release kinetics of cTnI,     a negative result within the first hours     of the onset of symptoms does not rule out     myocardial infarction with certainty.     If myocardial infarction is still suspected,     repeat the test at appropriate intervals.    Radiological Exams on Admission: Dg Hip Complete Right  03/21/2014   CLINICAL DATA:  RIGHT hip pain post fall earlier today  EXAM: RIGHT HIP - COMPLETE 2+ VIEW  COMPARISON:  None  FINDINGS: Osseous demineralization.  Minimal narrowing of the joints bilaterally.  Displaced fracture of the RIGHT femoral neck.  No dislocation.  SI joints symmetric.  No other focal bony abnormalities identified.  IMPRESSION: Displaced RIGHT femoral neck fracture without dislocation.   Electronically Signed   By: Lavonia Dana M.D.   On: 03/21/2014 16:31   Dg Chest Port 1 View  03/21/2014   CLINICAL DATA:  [place].  Hip fracture.  EXAM: PORTABLE CHEST - 1 VIEW  COMPARISON:  Chest x-ray 02/16/2014  FINDINGS: The heart is within normal limits and stable. There is tortuosity and calcification of the thoracic aorta. There is significant vascular congestion and probable interstitial pulmonary edema. No definite pleural effusions or pneumothorax. The bony thorax is intact.  IMPRESSION: Vascular congestion and pulmonary edema.   Electronically Signed   By: Kalman Jewels M.D.   On: 03/21/2014 17:54    EKG - sinus rhythm with biatrial enlargement  Assessment/Plan Principal Problem:   Hip fracture, right Active Problems:   DIABETES MELLITUS, TYPE II, UNCONTROLLED   Fall   UTI (lower urinary tract infection)   Hip fracture   Acute diastolic congestive heart failure  Hip fracture  Patient has displaced right femoral neck fracture, 64 physician Dr. Bobby Rumpf, has place consultation for orthopedics. Dr. Aline Brochure to see the patient. Will initiate hip fracture protocol. Will hold the Plavix at this time  CAD Stable EKG shows normal sinus rhythm,  will hold the Plavix at this time but continue with the lisinopril and metoprolol. Also continue Lipitor.  Acute diastolic CHF  Patient's echocardiogram done on 02/17/14 showed grade 1 diastolic dysfunction. Today chest x-ray reveals pulmonary edema, she also has elevated BNP 556. Patient has received one dose of Lasix in the ED. We'll start Lasix 20 mg IV every 12 hours.  Will get cardiology consultation to optimize her cardiac status before surgery.  ? UTI  Patient was recently treated with Macrobid for UTI , continues to have symptoms despite treatment  Will obtain another UA and culture .start Levaquin for possible recurrent failure.   Mild metabolic acidosis ?  Cause, will repeat BMP and lactic acid tonight.  Diabetes mellitus  We'll start Lantus 10 units at bedtime and sliding scale insulin with NovoLog.   DVT prophylaxis  Lovenox   Code status: DO NOT RESUSCITATE   Family discussion: discussed with daughter is at bedside   Time Spent on Admission:  53 minutes [facility] Pager: [phone] 03/21/2014, 8:30 PM  If 7PM-7AM, please contact night-coverage  [email or web address]  Password [email or web address]

## 2014-03-21 NOTE — Progress Notes (Signed)
ANTIBIOTIC CONSULT NOTE - INITIAL  Pharmacy Consult for Levaquin Indication: UTI  Allergies  Allergen Reactions  . Aricept [Donepezil Hcl]     Hives, vomiting and headache  . Namenda [Memantine Hcl]     hives  . Codeine   . Latex   . Penicillins Rash    Patient Measurements: Height: 5' 4" (162.6 cm) Weight: 153 lb 3.5 oz (69.5 kg) IBW/kg (Calculated) : 54.7  Vital Signs: Temp: 98.3 F (36.8 C) (05/06 2120) Temp src: Oral (05/06 2120) BP: 164/92 mmHg (05/06 2120) Pulse Rate: 108 (05/06 2120) Intake/Output from previous day:   Intake/Output from this shift:    Labs:  Recent Labs  03/21/14 1741 03/21/14 2052  WBC 15.2*  --   HGB 14.3  --   PLT 330  --   CREATININE 0.61 0.67   Estimated Creatinine Clearance: 52.8 ml/min (by C-G formula based on Cr of 0.67). No results found for this basename: VANCOTROUGH, VANCOPEAK, VANCORANDOM, GENTTROUGH, GENTPEAK, GENTRANDOM, TOBRATROUGH, TOBRAPEAK, TOBRARND, AMIKACINPEAK, AMIKACINTROU, AMIKACIN,  in the last 72 hours   Microbiology: No results found for this or any previous visit (from the past 720 hour(s)).  Medical History: Past Medical History  Diagnosis Date  . Hemorrhage of gastrointestinal tract, unspecified   . Other malaise and fatigue   . Tobacco use disorder   . Parotid tumor   . Neoplasm of malignant     breast  ,HX   . Diabetes mellitus   . Osteopenia   . Hypertension   . Hyperlipidemia   . Depression   . Anxiety state, unspecified   . Chronic headaches   . Carotid artery disease   . Orthostatic hypotension     Medications:  Scheduled:  . sodium chloride   Intravenous STAT  . ALPRAZolam  0.25 mg Oral BID  . atorvastatin  10 mg Oral q1800  . docusate sodium  100 mg Oral QHS  . enoxaparin (LOVENOX) injection  40 mg Subcutaneous QHS  . [START ON 03/22/2014] insulin aspart  0-9 Units Subcutaneous TID WC  . insulin glargine  10 Units Subcutaneous QHS  . [START ON 03/22/2014] lisinopril  20 mg Oral Daily   . [START ON 03/22/2014] metoprolol succinate  50 mg Oral Daily  . mirtazapine  30 mg Oral QHS  . potassium chloride  40 mEq Oral Q4H  . [START ON 03/22/2014] spironolactone  25 mg Oral Daily   Assessment: 80 yo F with recent hx of UTI treated with Macrobid.  She presents today after fall.  She continues to have sx of dysuria & fevers despite antibiotics.  Her WBC & lactic acid level are elevated.   Levaquin 5/6>>  Goal of Therapy:  Eradicate infection.  Plan:  Levaquin 230m IV daily Monitor renal function and cx data  Change to oral Levaquin once clinically appropriate  ALavonia DraftsLilliston 03/21/2014,10:07 PM

## 2014-03-21 NOTE — ED Notes (Signed)
Pt states she was standing at the sink washing up and fell. Complain of pain in right hip.

## 2014-03-21 NOTE — ED Provider Notes (Addendum)
CSN: [id number]     Arrival date & time 03/21/14  1535 History  This chart was scribed for Mervin Kung, MD by Zettie Pho, ED Scribe. This patient was seen in room APA08/[facility]08 and the patient's care was started at 4:38 PM.    Chief Complaint  Patient presents with  . Fall   Patient is a 80 y.o. female presenting with fall. The history is provided by the patient and a relative (daughter). No language interpreter was used.  Fall This is a new problem. The current episode started 3 to 5 hours ago. The problem occurs constantly. The problem has been gradually worsening. Associated symptoms include headaches (resolved) and shortness of breath. Pertinent negatives include no chest pain and no abdominal pain. Nothing aggravates the symptoms. Nothing relieves the symptoms. She has tried nothing for the symptoms.   HPI Comments: [name] is a 80 y.o. female with a history of malaise and fatigue, osteopenia, and orthostatic hypotension who presents to the Emergency Department complaining of a fall that occurred earlier today when she reports that she lost her balance. She states that she did hit her head, but denies loss of consciousness or headache. Patient is complaining of a constant, sudden onset pain, rated 9/10 currently, to the right hip secondary to the incident. Patient reports some associated shortness of breath secondary to the pain. She denies history of prior injury to the hip. Patient also presents to the ED with some bruising to the left hand secondary to the incident. Her daughter reports that the patient was complaining of a diffuse headache earlier today before the fall, but patient denies any currently. She denies abdominal pain, fever, chills, cough, rhinorrhea, sore throat, visual disturbance, chest pain, nausea, vomiting, diarrhea, dysuria, leg swelling, rash, history of bleeding easily. Patient has allergies to Aricept, Namenda, Codeine, and Penicillins. Patient also has a history  of DM, HTN, hyperlipidemia, and carotid artery disease.   PCP- Dr. [name]  Past Medical History  Diagnosis Date  . Hemorrhage of gastrointestinal tract, unspecified   . Other malaise and fatigue   . Tobacco use disorder   . Parotid tumor   . Neoplasm of malignant     breast  ,HX   . Diabetes mellitus   . Osteopenia   . Hypertension   . Hyperlipidemia   . Depression   . Anxiety state, unspecified   . Chronic headaches   . Carotid artery disease   . Orthostatic hypotension    Past Surgical History  Procedure Laterality Date  . Mastectomy  1994    left breast -related to breast cancer  . Vesicovaginal fistula closure w/ tah  1979  . Cholecystectomy  2008  . Hemorrhoid surgery  1960s  . Laparoscopic appendectomy N/A 04/13/2013    Procedure: APPENDECTOMY LAPAROSCOPIC;  Surgeon: Donato Heinz, MD;  Location: [facility];  Service: General;  Laterality: N/A;  . Colonoscopy  2010    Dr. Oneida Alar: single tubular adenoma, one hyperplastic polyp. Next TCS 2020 health permitting.   Family History  Problem Relation Age of Onset  . Heart failure Mother     CHF  . Cirrhosis Brother   . Lung cancer Father     brain mets  . Kidney cancer Mother   . Kidney cancer Sister    History  Substance Use Topics  . Smoking status: Former Smoker -- 0.50 packs/day for 40 years    Types: Cigarettes    Quit date: 03/30/2013  . Smokeless tobacco:  Not on file  . Alcohol Use: No   OB History   Grav Para Term Preterm Abortions TAB SAB Ect Mult Living                 Review of Systems  Constitutional: Negative for fever and chills.  HENT: Negative for rhinorrhea and sore throat.   Eyes: Negative for visual disturbance.  Respiratory: Positive for shortness of breath. Negative for cough.   Cardiovascular: Negative for chest pain and leg swelling.  Gastrointestinal: Negative for nausea, vomiting, abdominal pain and diarrhea.  Genitourinary: Negative for dysuria.  Musculoskeletal: Positive for  arthralgias.  Skin: Positive for color change (bruising, left hand). Negative for rash.  Neurological: Positive for headaches (resolved).  Hematological: Does not bruise/bleed easily.  Psychiatric/Behavioral: Negative for confusion.      Allergies  Aricept; Namenda; Codeine; Latex; and Penicillins  Home Medications   Prior to Admission medications   Medication Sig Start Date End Date Taking? Authorizing Provider  ALPRAZolam (NIRAVAM) 0.25 MG dissolvable tablet Take 0.25 mg by mouth at bedtime as needed.      Historical Provider, MD  atorvastatin (LIPITOR) 10 MG tablet Take 1 tablet (10 mg total) by mouth daily. 02/20/14   [name], MD  cholecalciferol (VITAMIN D) 1000 UNITS tablet Take 1,000 Units by mouth every morning.     Historical Provider, MD  clopidogrel (PLAVIX) 75 MG tablet Take 1 tablet (75 mg total) by mouth daily with breakfast. 02/20/14   [name], MD  Coenzyme Q10 (CO Q 10) 100 MG CAPS Take 100 mg by mouth every morning.     Historical Provider, MD  docusate sodium (COLACE) 100 MG capsule Take 100 mg by mouth at bedtime.     Historical Provider, MD  esomeprazole (NEXIUM) 40 MG capsule Take 40 mg by mouth at bedtime.     Historical Provider, MD  fish oil-omega-3 fatty acids 1000 MG capsule Take 1 g by mouth every morning.     Historical Provider, MD  glyBURIDE-metformin (GLUCOVANCE) 5-500 MG per tablet Take 2 tablets by mouth 2 (two) times daily with a meal.     Historical Provider, MD  insulin aspart (NOVOLOG) 100 UNIT/ML injection Inject 3 Units into the skin daily after breakfast.     Historical Provider, MD  insulin glargine (LANTUS) 100 UNIT/ML injection Inject 3-8 Units into the skin at bedtime. Give if BS 140-150= 3 units, 151-160=4 units, 161-170=5 units, 171-180= 6 units, >181=7units    Historical Provider, MD  lisinopril (PRINIVIL,ZESTRIL) 20 MG tablet Take 1 tablet (20 mg total) by mouth daily. 06/27/13   [name], MD  megestrol (MEGACE) 400 MG/10ML  suspension Take 10 mLs (400 mg total) by mouth 2 (two) times daily. 06/27/13   [name], MD  metoprolol succinate (TOPROL-XL) 50 MG 24 hr tablet Take 1 tablet (50 mg total) by mouth daily. Take with or immediately following a meal. 06/27/13   [name], MD  mirtazapine (REMERON) 30 MG tablet Take 30 mg by mouth at bedtime.    Historical Provider, MD  tiotropium (SPIRIVA) 18 MCG inhalation capsule Place 18 mcg into inhaler and inhale daily as needed (shortness of breath).    Historical Provider, MD   Triage Vitals: BP 191/119  Pulse 96  Temp(Src) 99.2 F (37.3 C) (Oral)  Resp 22  Ht _0  (1.626 m)  SpO2 91%  Physical Exam  Nursing note and vitals reviewed. Constitutional: She is oriented to person, place, and time. She appears well-developed and well-nourished.  No distress.  HENT:  Head: Normocephalic and atraumatic.  Eyes: Conjunctivae are normal.  Neck: Normal range of motion. Neck supple.  Cardiovascular: Regular rhythm, normal heart sounds and intact distal pulses.   Slightly tachycardic. 2+ DP pulse on the right with 1 second capillary refill.   Pulmonary/Chest: Effort normal and breath sounds normal. No respiratory distress.  Abdominal: Soft. She exhibits no distension. There is no tenderness.  Musculoskeletal: Normal range of motion. She exhibits no edema.  No lower extremity edema. No right leg shortening appreciated, but some external rotation.   Neurological: She is alert and oriented to person, place, and time. No cranial nerve deficit. She exhibits normal muscle tone. Coordination normal.  Skin: Skin is warm and dry.  Psychiatric: She has a normal mood and affect. Her behavior is normal.    ED Course  Procedures (including critical care time)  DIAGNOSTIC STUDIES: Oxygen Saturation is 91% on room air, adequate by my interpretation.    COORDINATION OF CARE: 3:49 PM- Ordered an EKG and an x-ray of the right hip.   4:47 PM- Discussed that x-ray results indicate  a fracture in the area. Discussed that the patient will need to be admitted to the hospital to have the fracture surgically repaired. Patient's pulse ox during examination was 88%, which correlates with her heart rate. Will order a chest x-ray, UA, CMP, lipase, CBC, and BNP. Will order IV fluids, Zofran, and Dilaudid to manage symptoms. Discussed treatment plan with patient at bedside and patient verbalized agreement.   6:31 PM- Reviewed lab and imaging results. Ordered troponin. Ordered Lasix to manage symptoms.  Results for orders placed during the hospital encounter of 03/21/14  COMPREHENSIVE METABOLIC PANEL      Result Value Ref Range   Sodium 139  137 - 147 mEq/L   Potassium 3.2 (*) 3.7 - 5.3 mEq/L   Chloride 99  96 - 112 mEq/L   CO2 16 (*) 19 - 32 mEq/L   Glucose, Bld 216 (*) 70 - 99 mg/dL   BUN 15  6 - 23 mg/dL   Creatinine, Ser 0.61  0.50 - 1.10 mg/dL   Calcium 9.6  8.4 - 10.5 mg/dL   Total Protein 7.8  6.0 - 8.3 g/dL   Albumin 3.9  3.5 - 5.2 g/dL   AST 28  0 - 37 U/L   ALT 18  0 - 35 U/L   Alkaline Phosphatase 152 (*) 39 - 117 U/L   Total Bilirubin 0.9  0.3 - 1.2 mg/dL   GFR calc non Af Amer 83 (*) >90 mL/min   GFR calc Af Amer >90  >90 mL/min  LIPASE, BLOOD      Result Value Ref Range   Lipase 23  11 - 59 U/L  CBC WITH DIFFERENTIAL      Result Value Ref Range   WBC 15.2 (*) 4.0 - 10.5 K/uL   RBC 4.12  3.87 - 5.11 MIL/uL   Hemoglobin 14.3  12.0 - 15.0 g/dL   HCT 40.5  36.0 - 46.0 %   MCV 98.3  78.0 - 100.0 fL   MCH 34.7 (*) 26.0 - 34.0 pg   MCHC 35.3  30.0 - 36.0 g/dL   RDW 13.7  11.5 - 15.5 %   Platelets 330  150 - 400 K/uL   Neutrophils Relative % 75  43 - 77 %   Neutro Abs 11.4 (*) 1.7 - 7.7 K/uL   Lymphocytes Relative 21  12 - 46 %  Lymphs Abs 3.1  0.7 - 4.0 K/uL   Monocytes Relative 4  3 - 12 %   Monocytes Absolute 0.6  0.1 - 1.0 K/uL   Eosinophils Relative 0  0 - 5 %   Eosinophils Absolute 0.1  0.0 - 0.7 K/uL   Basophils Relative 0  0 - 1 %   Basophils  Absolute 0.0  0.0 - 0.1 K/uL  PRO B NATRIURETIC PEPTIDE      Result Value Ref Range   Pro B Natriuretic peptide (BNP) 556.1 (*) 0 - 450 pg/mL  URINALYSIS, ROUTINE W REFLEX MICROSCOPIC      Result Value Ref Range   Color, Urine YELLOW  YELLOW   APPearance CLEAR  CLEAR   Specific Gravity, Urine 1.025  1.005 - 1.030   pH 5.5  5.0 - 8.0   Glucose, UA NEGATIVE  NEGATIVE mg/dL   Hgb urine dipstick TRACE (*) NEGATIVE   Bilirubin Urine NEGATIVE  NEGATIVE   Ketones, ur 15 (*) NEGATIVE mg/dL   Protein, ur 100 (*) NEGATIVE mg/dL   Urobilinogen, UA 0.2  0.0 - 1.0 mg/dL   Nitrite NEGATIVE  NEGATIVE   Leukocytes, UA NEGATIVE  NEGATIVE  URINE MICROSCOPIC-ADD ON      Result Value Ref Range   Squamous Epithelial / LPF RARE  RARE   WBC, UA 0-2  <3 WBC/hpf   RBC / HPF 3-6  <3 RBC/hpf   Bacteria, UA RARE  RARE   Casts HYALINE CASTS (*) NEGATIVE  TROPONIN I      Result Value Ref Range   Troponin I <0.30  <0.30 ng/mL   Dg Hip Complete Right  03/21/2014   CLINICAL DATA:  RIGHT hip pain post fall earlier today  EXAM: RIGHT HIP - COMPLETE 2+ VIEW  COMPARISON:  None  FINDINGS: Osseous demineralization.  Minimal narrowing of the joints bilaterally.  Displaced fracture of the RIGHT femoral neck.  No dislocation.  SI joints symmetric.  No other focal bony abnormalities identified.  IMPRESSION: Displaced RIGHT femoral neck fracture without dislocation.   Electronically Signed   By: Lavonia Dana M.D.   On: 03/21/2014 16:31   Dg Chest Port 1 View  03/21/2014   CLINICAL DATA:  [place].  Hip fracture.  EXAM: PORTABLE CHEST - 1 VIEW  COMPARISON:  Chest x-ray 02/16/2014  FINDINGS: The heart is within normal limits and stable. There is tortuosity and calcification of the thoracic aorta. There is significant vascular congestion and probable interstitial pulmonary edema. No definite pleural effusions or pneumothorax. The bony thorax is intact.  IMPRESSION: Vascular congestion and pulmonary edema.   Electronically Signed   By:  Kalman Jewels M.D.   On: 03/21/2014 17:54    EKG Interpretation   Date/Time:  Wednesday Mar 21 2014 15:58:28 EDT Ventricular Rate:  99 PR Interval:  204 QRS Duration: 78 QT Interval:  335 QTC Calculation: 430 R Axis:   -86 Text Interpretation:  Sinus rhythm Biatrial enlargement Left anterior  fascicular block Nonspecific repol abnormality, diffuse leads No  significant change since last tracing Confirmed by Anaid Haney  MD, Michaelia Beilfuss  [phone]) on 03/21/2014 4:13:21 PM      MDM   Final diagnoses:  Fracture of femoral neck, right  Pulmonary edema  Hypertension   Patient status post fall earlier today at home resulting in a right femoral neck fracture. No loss of consciousness. No head injury. Patient also noted to have pulmonary edema on chest x-ray patient was very hypertensive but that improved with pain  medication. But does have a history of hypertension troponin was negative EKG without any acute findings. The troponin being negative no evidence of an acute cardiac event. Patient given 40 mg of Lasix IV. Patient is normally not on Lasix. Patient's renal function was fine troponin was fine urinalysis was negative for any evidence of urinary tract infection. Patient to the point that show signs of metabolic acidosis CO2 is 16. Specific cause of that is not clear at this point in time. There is no evidence of infection however she does have a leukocytosis. Patient will require medical admission consult out to orthopedics as well. Patient mentally Truman Hayward is mentating fine.     I personally performed the services described in this documentation, which was scribed in my presence. The recorded information has been reviewed and is accurate.      Mervin Kung, MD 03/21/14 1953   Addendum: Patient discussed with Dr. Aline Brochure from orthopedics he will consult on the patient.  Mervin Kung, MD 03/21/14 2029

## 2014-03-22 ENCOUNTER — Encounter (HOSPITAL_COMMUNITY): Payer: Self-pay | Admitting: Cardiology

## 2014-03-22 DIAGNOSIS — I471 Supraventricular tachycardia: Secondary | ICD-10-CM

## 2014-03-22 DIAGNOSIS — Z01810 Encounter for preprocedural cardiovascular examination: Secondary | ICD-10-CM

## 2014-03-22 DIAGNOSIS — I10 Essential (primary) hypertension: Secondary | ICD-10-CM

## 2014-03-22 DIAGNOSIS — I2510 Atherosclerotic heart disease of native coronary artery without angina pectoris: Secondary | ICD-10-CM

## 2014-03-22 LAB — BASIC METABOLIC PANEL
BUN: 17 mg/dL (ref 6–23)
CO2: 20 mEq/L (ref 19–32)
Calcium: 9.4 mg/dL (ref 8.4–10.5)
Chloride: 102 mEq/L (ref 96–112)
Creatinine, Ser: 0.79 mg/dL (ref 0.50–1.10)
GFR calc Af Amer: 88 mL/min — ABNORMAL LOW (ref >90)
GFR calc non Af Amer: 76 mL/min — ABNORMAL LOW (ref >90)
Glucose, Bld: 259 mg/dL — ABNORMAL HIGH (ref 70–99)
Potassium: 4.1 mEq/L (ref 3.7–5.3)
Sodium: 140 mEq/L (ref 137–147)

## 2014-03-22 LAB — CBC
HCT: 39.9 % (ref 36.0–46.0)
Hemoglobin: 14.1 g/dL (ref 12.0–15.0)
MCH: 34.2 pg — ABNORMAL HIGH (ref 26.0–34.0)
MCHC: 35.3 g/dL (ref 30.0–36.0)
MCV: 96.8 fL (ref 78.0–100.0)
Platelets: 300 10*3/uL (ref 150–400)
RBC: 4.12 MIL/uL (ref 3.87–5.11)
RDW: 13.6 % (ref 11.5–15.5)
WBC: 10.5 10*3/uL (ref 4.0–10.5)

## 2014-03-22 LAB — GLUCOSE, CAPILLARY
Glucose-Capillary: 224 mg/dL — ABNORMAL HIGH (ref 70–99)
Glucose-Capillary: 247 mg/dL — ABNORMAL HIGH (ref 70–99)
Glucose-Capillary: 201 mg/dL — ABNORMAL HIGH (ref 70–99)
Glucose-Capillary: 171 mg/dL — ABNORMAL HIGH (ref 70–99)

## 2014-03-22 MED ORDER — FUROSEMIDE 10 MG/ML IJ SOLN
40.0000 mg | Freq: Every day | INTRAMUSCULAR | Status: DC
  Administered 2014-03-22 – 2014-03-23 (×2): 40 mg via INTRAVENOUS
  Filled 2014-03-22 (×2): qty 4

## 2014-03-22 NOTE — Progress Notes (Signed)
Inpatient Diabetes Program Recommendations  AACE/ADA: New Consensus Statement on Inpatient Glycemic Control (2013)  Target Ranges:  Prepandial:   less than 140 mg/dL      Peak postprandial:   less than 180 mg/dL (1-2 hours)      Critically ill patients:  140 - 180 mg/dL   Results for AVAROSE, MERVINE (MRN [id number]) as of 03/22/2014 12:57  Ref. Range 03/21/2014 21:28 03/22/2014 07:41 03/22/2014 11:07  Glucose-Capillary Latest Range: 70-99 mg/dL 229 (H) 224 (H) 247 (H)   Diabetes history: DM Outpatient Diabetes medications: Lantus 3-8 units depending on CBG, Novolog 3 units with supper, Glucovance 5-500 mg BID Current orders for Inpatient glycemic control: Lantus 10 units QHS, Novolog 0-9 units AC  Inpatient Diabetes Program Recommendations Insulin - Basal: Please consider increasing Lantus to 12 units QHS. Insulin-Correction: Please consider ordering Novolog bedtime correction scale.  Thanks, Barnie Alderman, RN, MSN, CCRN Diabetes Coordinator Inpatient Diabetes Program [phone] (Team Pager) [phone] ([facility] office) [phone] [facility] office)

## 2014-03-22 NOTE — Consult Note (Signed)
Primary cardiologist: Dr. Darlina Guys Consulting cardiologist: Dr. Satira Sark  Clinical Summary Ms. Tilmon is an 80 y.o.female admitted to the hospital after a fall with subsequent displaced right femoral neck fracture without dislocation. She has had PT and OT at home per discussion with her daughter, has done reasonably well with this, although lost her balance as she was going to the bathroom. There was no loss of consciousness, reportedly no chest pain or palpitations.  Cardiac history is outlined below, and has been managed medically. She had a recent echocardiogram done last month, reviewed below. Chest x-ray showed vascular congestion with some pulmonary edema at presentation in the setting of elevated heart rate and with active hip pain. Pro-BNP is only mildly elevated at 556. She was given a single dose of IV Lasix in the ER, diureses 100 cc.   Allergies  Allergen Reactions  . Aricept [Donepezil Hcl]     Hives, vomiting and headache  . Namenda [Memantine Hcl]     hives  . Codeine   . Latex   . Penicillins Rash    Medications Scheduled Medications: . sodium chloride   Intravenous STAT  . ALPRAZolam  0.25 mg Oral BID  . atorvastatin  10 mg Oral q1800  . docusate sodium  100 mg Oral QHS  . enoxaparin (LOVENOX) injection  40 mg Subcutaneous QHS  . insulin aspart  0-9 Units Subcutaneous TID WC  . insulin glargine  10 Units Subcutaneous QHS  . levofloxacin (LEVAQUIN) IV  250 mg Intravenous Q24H  . lisinopril  20 mg Oral Daily  . metoprolol succinate  50 mg Oral Daily  . mirtazapine  30 mg Oral QHS  . spironolactone  25 mg Oral Daily     PRN Medications: HYDROcodone-acetaminophen, methocarbamol (ROBAXIN) IV, methocarbamol, morphine injection   Past Medical History  Diagnosis Date  . History of GI bleed   . Parotid tumor   . History of breast cancer   . Type 2 diabetes mellitus   . Osteopenia   . Essential hypertension, benign   . Hyperlipidemia   .  Depression   . Anxiety   . Chronic headaches   . Carotid artery disease     Bilateral ICA occlusion  . Orthostatic hypotension     Diabetic polyneuropathy/diabetic dysautonomia   . Coronary atherosclerosis of native coronary artery     Mild nonobstructive disease at cardiac catheterization May 2001  . PSVT (paroxysmal supraventricular tachycardia)     Past Surgical History  Procedure Laterality Date  . Mastectomy  1994    Left breast -related to breast cancer  . Vesicovaginal fistula closure w/ tah  1979  . Cholecystectomy  2008  . Hemorrhoid surgery  1960s  . Laparoscopic appendectomy N/A 04/13/2013    Procedure: APPENDECTOMY LAPAROSCOPIC;  Surgeon: Donato Heinz, MD;  Location: [facility];  Service: General;  Laterality: N/A;  . Colonoscopy  2010    Dr. Oneida Alar: single tubular adenoma, one hyperplastic polyp. Next TCS 2020 health permitting.    Family History  Problem Relation Age of Onset  . Heart failure Mother   . Cirrhosis Brother   . Lung cancer Father     Brain METS  . Kidney cancer Mother   . Kidney cancer Sister     Social History Ms. Parady reports that she quit smoking about a year ago. Her smoking use included Cigarettes. She has a 20 pack-year smoking history. She does not have any smokeless tobacco history on file. Ms.  Ruesch reports that she does not drink alcohol.  Review of Systems Difficult to obtain, patient answers questions to a very limited degree.  Physical Examination Blood pressure 164/92, pulse 108, temperature 98.3 F (36.8 C), temperature source Oral, resp. rate 20, height _0  (1.626 m), weight 153 lb 3.5 oz (69.5 kg), SpO2 95.00%. No intake or output data in the 24 hours ending 03/22/14 0810  Elderly woman, no distress, but complaining of hip pain on the right. HEENT: Conjunctiva and lids normal, oropharynx clear. Neck: Supple, no elevated JVP, no thyromegaly. Lungs: Clear to auscultation with few crackles at the bases, nonlabored breathing  at rest. Cardiac: Regular rate and rhythm, no S3, soft systolic murmur, no pericardial rub. Abdomen: Soft, nontender, bowel sounds present, no guarding or rebound. Extremities: No pitting edema, distal pulses 1-2+. Right leg shortened. Skin: Warm and dry. Musculoskeletal: No kyphosis. Neuropsychiatric: Alert and oriented x3, affect grossly appropriate.   Lab Results  Basic Metabolic Panel:  Recent Labs Lab 03/21/14 1741 03/21/14 2052 03/22/14 0500  NA 139 140 140  K 3.2* 3.6* 4.1  CL 99 99 102  CO2 16* 18* 20  GLUCOSE 216* 243* 259*  BUN _1 CREATININE 0.61 0.67 0.79  CALCIUM 9.6 9.5 9.4    Liver Function Tests:  Recent Labs Lab 03/21/14 1741  AST 28  ALT 18  ALKPHOS 152*  BILITOT 0.9  PROT 7.8  ALBUMIN 3.9    CBC:  Recent Labs Lab 03/21/14 1741 03/22/14 0500  WBC 15.2* 10.5  NEUTROABS 11.4*  --   HGB 14.3 14.1  HCT 40.5 39.9  MCV 98.3 96.8  PLT 330 300    Cardiac Enzymes:  Recent Labs Lab 03/21/14 1838  TROPONINI <0.30    Pro-BNP: 556  ECG Sinus tachycardia with left anterior fascicular block, bhiatrial enlargement, nonspecific ST changes  Imaging PORTABLE CHEST - 1 VIEW  COMPARISON: Chest x-ray 02/16/2014  FINDINGS: The heart is within normal limits and stable. There is tortuosity and calcification of the thoracic aorta. There is significant vascular congestion and probable interstitial pulmonary edema. No definite pleural effusions or pneumothorax. The bony thorax is intact.  IMPRESSION: Vascular congestion and pulmonary edema.   Echocardiogram (02/17/2014): Study Conclusions  - Left ventricle: The cavity size was normal. Wall thickness was increased in a pattern of severe LVH. There was mild focal basal hypertrophy of the septum. Systolic function was vigorous. The estimated ejection fraction was in the range of 65% to 70%. Wall motion was normal; there were no regional wall motion abnormalities. Doppler parameters  are consistent with abnormal left ventricular relaxation (grade 1 diastolic dysfunction). - Left atrium: The atrium was mildly dilated. - Tricuspid valve: Moderate regurgitation. - Pulmonary arteries: Systolic pressure was mildly increased. Impressions:  - Elevated LVOT gradient of 2.9 m/s most likely related to vigorous LV function in association with proximal septal thickening.   Impression  1. Preoperative evaluation pending anticipated surgical repair of a right femoral neck fracture. Patient had a recent fall without syncope. Cardiac markers argue against ACS, currently in sinus rhythm. She did have pulmonary edema at presentation. Recent echocardiogram showed vigorous LVEF with hypertrophic pattern, septal hypertrophy with LVOT gradient. She is on beta blocker at baseline as well as Aldactone.  2. Fall with subsequent right femoral neck fracture.  3. Severe bilateral carotid artery disease, total occlusions. This has been managed with statin therapy and antiplatelet management. Patient seen by neurology in April of this year - see discharge summary for  details.  4. History of mild, nonobstructive CAD as of cardiac catheterization May 2011. No obvious angina. This has been managed medically.  5. Type 2 diabetes mellitus.  6. Hypertension, also history of orthostasis.  7. UTI, on antibiotics.  8. History of PSVT, on beta blocker.  9. Dementia.  Recommendations  Would place patient on telemetry, continue beta blocker and spironolactone. Plavix held pending surgery. Will place her on IV Lasix for now to ensure more optimal volume status pending hip surgery. She is at least at moderate perioperative risk in light of her comorbidities. We will follow with you.  Satira Sark, M.D., F.A.C.C.

## 2014-03-22 NOTE — Care Management Note (Unsigned)
Page 1 of 1   03/22/2014     1:22:58 PM CARE MANAGEMENT NOTE 03/22/2014  Patient:  KIRSTI, MCALPINE   Account Number:  [id number]  Date Initiated:  03/22/2014  Documentation initiated by:  Claretha Cooper  Subjective/Objective Assessment:   Pt admitted from home with her daughter and son. She fell at home and is now being evaluated for hip surgery. Currently with [facility] RN, PT, OT and SP. Has 3/1, wheelchair. Daughter at bedside feel short term rehab would be best at DC.     Action/Plan:   Anticipated DC Date:  03/26/2014   Anticipated DC Plan:  SKILLED NURSING FACILITY  In-house referral  Clinical Social Worker      DC Planning Services  CM consult      Choice offered to / List presented to:             Status of service:   Medicare Important Message given?   (If response is "NO", the following Medicare IM given date fields will be blank) Date Medicare IM given:   Date Additional Medicare IM given:    Discharge Disposition:    Per UR Regulation:    If discussed at Long Length of Stay Meetings, dates discussed:    Comments:  03/22/14 Braylea Brancato Dellia Nims RN BSN CM

## 2014-03-22 NOTE — Progress Notes (Signed)
[facility] PROGRESS NOTE  [name] MBO:[id number] DOB: 01-26-1931 DOA: 03/21/2014 PCP: Michele Gravel, MD  Assessment/Plan: 1. Right hip fracture. Orthopedic consultation has been requested. 2. Acute diastolic congestive heart failure. Start on intravenous Lasix. Cardiology following. Continue to monitor intake and output. 3. Acute respiratory failure due to #2. Wean off oxygen as tolerated. 4. History of coronary disease. Plavix on hold secondary to #1 and need for surgery. Will resume when able. 5. Possible UTI. Patient did complain of dysuria, but urinalysis does not indicate a need for infection. Will discontinue Levaquin for now. 6. Mild metabolic acidosis. Lactic acid was elevated. This appears to be resolving. 7. Diabetes. On Lantus and sliding scale insulin.  Code Status: DNR Family Communication: discussed with daughter at the bedside Disposition Plan: pending hospital course, suspect SNF placement   Consultants:  Cardiology  Orthopedics  Procedures:    Antibiotics:  levaquin 5/6>>5/7  HPI/Subjective: Answers yes and no.  Denies any complaints.  Objective: Filed Vitals:   03/22/14 1555  BP: 171/87  Pulse: 100  Temp: 98.3 F (36.8 C)  Resp: 20    Intake/Output Summary (Last 24 hours) at 03/22/14 1633 Last data filed at 03/22/14 1200  Gross per 24 hour  Intake      0 ml  Output      0 ml  Net      0 ml   Filed Weights   03/21/14 2120  Weight: 69.5 kg (153 lb 3.5 oz)    Exam:   General:  NAD  Cardiovascular: S1, S2 RRR  Respiratory: crackles at bases  Abdomen: soft, nt, nd, bs+  Musculoskeletal: 1+ edema in LE bilaterally   Data Reviewed: Basic Metabolic Panel:  Recent Labs Lab 03/21/14 1741 03/21/14 2052 03/22/14 0500  NA 139 140 140  K 3.2* 3.6* 4.1  CL 99 99 102  CO2 16* 18* 20  GLUCOSE 216* 243* 259*  BUN _0 CREATININE 0.61 0.67 0.79  CALCIUM 9.6 9.5 9.4   Liver Function Tests:  Recent Labs Lab  03/21/14 1741  AST 28  ALT 18  ALKPHOS 152*  BILITOT 0.9  PROT 7.8  ALBUMIN 3.9    Recent Labs Lab 03/21/14 1741  LIPASE 23   No results found for this basename: AMMONIA,  in the last 168 hours CBC:  Recent Labs Lab 03/21/14 1741 03/22/14 0500  WBC 15.2* 10.5  NEUTROABS 11.4*  --   HGB 14.3 14.1  HCT 40.5 39.9  MCV 98.3 96.8  PLT 330 300   Cardiac Enzymes:  Recent Labs Lab 03/21/14 1838  TROPONINI <0.30   BNP (last 3 results)  Recent Labs  06/24/13 0513 02/18/14 0724 03/21/14 1741  PROBNP 768.3* 285.7 556.1*   CBG:  Recent Labs Lab 03/21/14 2128 03/22/14 0741 03/22/14 1107 03/22/14 1623  GLUCAP 229* 224* 247* 201*    No results found for this or any previous visit (from the past 240 hour(s)).   Studies: Dg Hip Complete Right  03/21/2014   CLINICAL DATA:  RIGHT hip pain post fall earlier today  EXAM: RIGHT HIP - COMPLETE 2+ VIEW  COMPARISON:  None  FINDINGS: Osseous demineralization.  Minimal narrowing of the joints bilaterally.  Displaced fracture of the RIGHT femoral neck.  No dislocation.  SI joints symmetric.  No other focal bony abnormalities identified.  IMPRESSION: Displaced RIGHT femoral neck fracture without dislocation.   Electronically Signed   By: Lavonia Dana M.D.   On: 03/21/2014 16:31   Dg  Chest Port 1 View  03/21/2014   CLINICAL DATA:  [place].  Hip fracture.  EXAM: PORTABLE CHEST - 1 VIEW  COMPARISON:  Chest x-ray 02/16/2014  FINDINGS: The heart is within normal limits and stable. There is tortuosity and calcification of the thoracic aorta. There is significant vascular congestion and probable interstitial pulmonary edema. No definite pleural effusions or pneumothorax. The bony thorax is intact.  IMPRESSION: Vascular congestion and pulmonary edema.   Electronically Signed   By: Kalman Jewels M.D.   On: 03/21/2014 17:54    Scheduled Meds: . ALPRAZolam  0.25 mg Oral BID  . atorvastatin  10 mg Oral q1800  . docusate sodium  100 mg Oral QHS   . enoxaparin (LOVENOX) injection  40 mg Subcutaneous QHS  . furosemide  40 mg Intravenous Daily  . insulin aspart  0-9 Units Subcutaneous TID WC  . insulin glargine  10 Units Subcutaneous QHS  . levofloxacin (LEVAQUIN) IV  250 mg Intravenous Q24H  . lisinopril  20 mg Oral Daily  . metoprolol succinate  50 mg Oral Daily  . mirtazapine  30 mg Oral QHS  . spironolactone  25 mg Oral Daily   Continuous Infusions:   Principal Problem:   Hip fracture, right Active Problems:   DIABETES MELLITUS, TYPE II, UNCONTROLLED   Fall   UTI (lower urinary tract infection)   Hip fracture   Acute diastolic congestive heart failure   Preoperative cardiovascular examination    Time spent: 44mns    [name]  [facility] Pager [phone] If 7PM-7AM, please contact night-coverage at [email or web address], password [email or web address]5/05/2014, 4:33 PM  LOS: 1 day

## 2014-03-22 NOTE — Progress Notes (Signed)
Utilization Review Complete

## 2014-03-23 DIAGNOSIS — J811 Chronic pulmonary edema: Secondary | ICD-10-CM

## 2014-03-23 LAB — GLUCOSE, CAPILLARY
Glucose-Capillary: 213 mg/dL — ABNORMAL HIGH (ref 70–99)
Glucose-Capillary: 183 mg/dL — ABNORMAL HIGH (ref 70–99)
Glucose-Capillary: 153 mg/dL — ABNORMAL HIGH (ref 70–99)
Glucose-Capillary: 156 mg/dL — ABNORMAL HIGH (ref 70–99)
Glucose-Capillary: 210 mg/dL — ABNORMAL HIGH (ref 70–99)

## 2014-03-23 LAB — BASIC METABOLIC PANEL
BUN: 23 mg/dL (ref 6–23)
CO2: 22 mEq/L (ref 19–32)
Calcium: 9.9 mg/dL (ref 8.4–10.5)
Chloride: 105 mEq/L (ref 96–112)
Creatinine, Ser: 0.79 mg/dL (ref 0.50–1.10)
GFR calc Af Amer: 88 mL/min — ABNORMAL LOW (ref >90)
GFR calc non Af Amer: 76 mL/min — ABNORMAL LOW (ref >90)
Glucose, Bld: 224 mg/dL — ABNORMAL HIGH (ref 70–99)
Potassium: 3.7 mEq/L (ref 3.7–5.3)
Sodium: 147 mEq/L (ref 137–147)

## 2014-03-23 LAB — CBC
HCT: 41.3 % (ref 36.0–46.0)
Hemoglobin: 13.9 g/dL (ref 12.0–15.0)
MCH: 33.4 pg (ref 26.0–34.0)
MCHC: 33.7 g/dL (ref 30.0–36.0)
MCV: 99.3 fL (ref 78.0–100.0)
Platelets: 243 10*3/uL (ref 150–400)
RBC: 4.16 MIL/uL (ref 3.87–5.11)
RDW: 13.5 % (ref 11.5–15.5)
WBC: 9.6 10*3/uL (ref 4.0–10.5)

## 2014-03-23 LAB — URINE CULTURE
Colony Count: NO GROWTH
Culture: NO GROWTH

## 2014-03-23 NOTE — Progress Notes (Signed)
[facility] PROGRESS NOTE  [name] YOX:[id number] DOB: [birth year] DOA: 03/21/2014 PCP: [name], MD  Assessment/Plan: 1. Right hip fracture. Orthopedics following. Since patient is on Plavix last dose was on 5/6, her surgery will need to be delayed at least 5 days. Tentative plans for surgery will be next week. 2. Acute diastolic congestive heart failure. Start on intravenous Lasix. Cardiology following. Continue to monitor intake and output. Continue current meds 3. Acute respiratory failure due to #2. Wean off oxygen as tolerated. 4. History of coronary disease. Plavix on hold secondary to #1 and need for surgery. Will resume when able. 5. Possible UTI. Patient did complain of dysuria, but urinalysis does not indicate a need for infection. Will discontinue Levaquin for now. 6. Mild metabolic acidosis. Lactic acid was elevated. This appears to be resolving. 7. Diabetes. On Lantus and sliding scale insulin.  Code Status: DNR Family Communication: discussed with daughter at the bedside Disposition Plan: pending hospital course, suspect SNF placement   Consultants:  Cardiology  Orthopedics  Procedures:    Antibiotics:  levaquin 5/6>>5/7  HPI/Subjective: Says she does not feel well, but does not further describe her symptoms.  Objective: Filed Vitals:   03/23/14 1848  BP: 120/76  Pulse: 103  Temp: 98 F (36.7 C)  Resp: 18    Intake/Output Summary (Last 24 hours) at 03/23/14 1937 Last data filed at 03/23/14 1900  Gross per 24 hour  Intake    200 ml  Output    375 ml  Net   -175 ml   Filed Weights   03/21/14 2120  Weight: 69.5 kg (153 lb 3.5 oz)    Exam:   General:  NAD  Cardiovascular: S1, S2 RRR  Respiratory: crackles at bases  Abdomen: soft, nt, nd, bs+  Musculoskeletal: No edema in LE bilaterally   Data Reviewed: Basic Metabolic Panel:  Recent Labs Lab 03/21/14 1741 03/21/14 2052 03/22/14 0500 03/23/14 0604  NA 139 140 140  147  K 3.2* 3.6* 4.1 3.7  CL 99 99 102 105  CO2 16* 18* 20 22  GLUCOSE 216* 243* 259* 224*  BUN _0 CREATININE 0.61 0.67 0.79 0.79  CALCIUM 9.6 9.5 9.4 9.9   Liver Function Tests:  Recent Labs Lab 03/21/14 1741  AST 28  ALT 18  ALKPHOS 152*  BILITOT 0.9  PROT 7.8  ALBUMIN 3.9    Recent Labs Lab 03/21/14 1741  LIPASE 23   No results found for this basename: AMMONIA,  in the last 168 hours CBC:  Recent Labs Lab 03/21/14 1741 03/22/14 0500 03/23/14 0604  WBC 15.2* 10.5 9.6  NEUTROABS 11.4*  --   --   HGB 14.3 14.1 13.9  HCT 40.5 39.9 41.3  MCV 98.3 96.8 99.3  PLT 330 300 243   Cardiac Enzymes:  Recent Labs Lab 03/21/14 1838  TROPONINI <0.30   BNP (last 3 results)  Recent Labs  06/24/13 0513 02/18/14 0724 03/21/14 1741  PROBNP 768.3* 285.7 556.1*   CBG:  Recent Labs Lab 03/22/14 2002 03/23/14 0716 03/23/14 1126 03/23/14 1656 03/23/14 1715  GLUCAP 171* 213* 183* 156* 153*    Recent Results (from the past 240 hour(s))  URINE CULTURE     Status: None   Collection Time    03/21/14  5:35 PM      Result Value Ref Range Status   Specimen Description URINE, CATHETERIZED   Final   Special Requests NONE   Final   Culture  Setup Time     Final   Value: 03/21/2014 22:00     Performed at [place]     Final   Value: NO GROWTH     Performed at [facility]   Culture     Final   Value: NO GROWTH     Performed at [facility]   Report Status 03/23/2014 FINAL   Final     Studies: No results found.  Scheduled Meds: . ALPRAZolam  0.25 mg Oral BID  . atorvastatin  10 mg Oral q1800  . docusate sodium  100 mg Oral QHS  . enoxaparin (LOVENOX) injection  40 mg Subcutaneous QHS  . furosemide  40 mg Intravenous Daily  . insulin aspart  0-9 Units Subcutaneous TID WC  . insulin glargine  10 Units Subcutaneous QHS  . lisinopril  20 mg Oral Daily  . metoprolol succinate  50 mg Oral Daily  .  mirtazapine  30 mg Oral QHS  . spironolactone  25 mg Oral Daily   Continuous Infusions:   Principal Problem:   Hip fracture, right Active Problems:   DIABETES MELLITUS, TYPE II, UNCONTROLLED   Fall   UTI (lower urinary tract infection)   Hip fracture   Acute diastolic congestive heart failure   Preoperative cardiovascular examination    Time spent: 27mns    Lynsey Ange  [facility] Pager [phone] If 7PM-7AM, please contact night-coverage at [email or web address], password [email or web address]03/23/2014, 7:37 PM  LOS: 2 days

## 2014-03-23 NOTE — Consult Note (Signed)
Reason for Consult: right hip fracture  Referring Physician: Dr Darrick Meigs  [name] is an 80 y.o. female.  Chief Complaint:    Fall  HPI: 80 year old female who   has a past medical history of Hemorrhage of gastrointestinal tract, unspecified; Other malaise and fatigue; Tobacco use disorder; Parotid tumor; Neoplasm of malignant; Diabetes mellitus; Osteopenia; Hypertension; Hyperlipidemia; Depression; Anxiety state, unspecified; Chronic headaches; Carotid artery disease; and Orthostatic hypotension. Was brought to the ED after patient fell in the bathroom. As per patient she went to the toilet and was about to clean herself when she lost her balance and fell. She denies loss of consciousness, no headache no chest pain. Patient says that after falling she had sudden onset of pain in the right hip. She also has some shortness of breath, patient was brought to the ED . In the ED patient was found to be mildly hypoxic, with O2 sats 88% on room air . Chest x-ray showed pulmonary edema and vascular congestion , BNP elevated to 556. Last 2-D echocardiogram showed grade 1 diastolic dysfunction.    also patient found to have displaced right femoral neck fracture without dislocation. Orthopedics been consulted by ED physician. She received a dose of Lasix 40 mg IV x1, and has diuresed about 800 cc of urine. Patient also has been diagnosed with UTI recently, and has been taking Macrobid twice a day, despite taking Macrobid patient continued to have symptoms of dysuria and intermittent fevers. She has elevated white count of 15,000.    Past Medical History  Diagnosis Date  . History of GI bleed   . Parotid tumor   . History of breast cancer   . Type 2 diabetes mellitus   . Osteopenia   . Essential hypertension, benign   . Hyperlipidemia   . Depression   . Anxiety   . Chronic headaches   . Carotid artery disease     Bilateral ICA occlusion  . Orthostatic hypotension     Diabetic  polyneuropathy/diabetic dysautonomia   . Coronary atherosclerosis of native coronary artery     Mild nonobstructive disease at cardiac catheterization May 2011  . PSVT (paroxysmal supraventricular tachycardia)     Past Surgical History  Procedure Laterality Date  . Mastectomy  1994    Left breast -related to breast cancer  . Vesicovaginal fistula closure w/ tah  1979  . Cholecystectomy  2008  . Hemorrhoid surgery  1960s  . Laparoscopic appendectomy N/A 04/13/2013    Procedure: APPENDECTOMY LAPAROSCOPIC;  Surgeon: Donato Heinz, MD;  Location: [facility];  Service: General;  Laterality: N/A;  . Colonoscopy  2010    Dr. Oneida Alar: single tubular adenoma, one hyperplastic polyp. Next TCS 2020 health permitting.    Family History  Problem Relation Age of Onset  . Heart failure Mother   . Cirrhosis Brother   . Lung cancer Father     Brain METS  . Kidney cancer Mother   . Kidney cancer Sister     Social History:  reports that she quit smoking about a year ago. Her smoking use included Cigarettes. She has a 20 pack-year smoking history. She does not have any smokeless tobacco history on file. She reports that she does not drink alcohol or use illicit drugs.  Allergies:  Allergies  Allergen Reactions  . Aricept [Donepezil Hcl]     Hives, vomiting and headache  . Namenda [Memantine Hcl]     hives  . Codeine   . Latex   .  Penicillins Rash    Medications: I have reviewed the patient's current medications. Had palvix Wednesday morning !  Results for orders placed during the hospital encounter of 03/21/14 (from the past 48 hour(s))  URINALYSIS, ROUTINE W REFLEX MICROSCOPIC     Status: Abnormal   Collection Time    03/21/14  5:35 PM      Result Value Ref Range   Color, Urine YELLOW  YELLOW   APPearance CLEAR  CLEAR   Specific Gravity, Urine 1.025  1.005 - 1.030   pH 5.5  5.0 - 8.0   Glucose, UA NEGATIVE  NEGATIVE mg/dL   Hgb urine dipstick TRACE (*) NEGATIVE   Bilirubin Urine  NEGATIVE  NEGATIVE   Ketones, ur 15 (*) NEGATIVE mg/dL   Protein, ur 100 (*) NEGATIVE mg/dL   Urobilinogen, UA 0.2  0.0 - 1.0 mg/dL   Nitrite NEGATIVE  NEGATIVE   Leukocytes, UA NEGATIVE  NEGATIVE  URINE MICROSCOPIC-ADD ON     Status: Abnormal   Collection Time    03/21/14  5:35 PM      Result Value Ref Range   Squamous Epithelial / LPF RARE  RARE   WBC, UA 0-2  <3 WBC/hpf   RBC / HPF 3-6  <3 RBC/hpf   Bacteria, UA RARE  RARE   Casts HYALINE CASTS (*) NEGATIVE  URINE CULTURE     Status: None   Collection Time    03/21/14  5:35 PM      Result Value Ref Range   Specimen Description URINE, CATHETERIZED     Special Requests NONE     Culture  Setup Time       Value: 03/21/2014 22:00     Performed at [facility]       Value: NO GROWTH     Performed at [facility]   Culture       Value: NO GROWTH     Performed at [facility]   Report Status 03/23/2014 FINAL    COMPREHENSIVE METABOLIC PANEL     Status: Abnormal   Collection Time    03/21/14  5:41 PM      Result Value Ref Range   Sodium 139  137 - 147 mEq/L   Potassium 3.2 (*) 3.7 - 5.3 mEq/L   Chloride 99  96 - 112 mEq/L   CO2 16 (*) 19 - 32 mEq/L   Glucose, Bld 216 (*) 70 - 99 mg/dL   BUN 15  6 - 23 mg/dL   Creatinine, Ser 0.61  0.50 - 1.10 mg/dL   Calcium 9.6  8.4 - 10.5 mg/dL   Total Protein 7.8  6.0 - 8.3 g/dL   Albumin 3.9  3.5 - 5.2 g/dL   AST 28  0 - 37 U/L   ALT 18  0 - 35 U/L   Alkaline Phosphatase 152 (*) 39 - 117 U/L   Total Bilirubin 0.9  0.3 - 1.2 mg/dL   GFR calc non Af Amer 83 (*) >90 mL/min   GFR calc Af Amer >90  >90 mL/min   Comment: (NOTE)     The eGFR has been calculated using the CKD EPI equation.     This calculation has not been validated in all clinical situations.     eGFR's persistently <90 mL/min signify possible Chronic Kidney     Disease.  LIPASE, BLOOD     Status: None   Collection Time    03/21/14  5:41 PM  Result Value Ref Range   Lipase  23  11 - 59 U/L  CBC WITH DIFFERENTIAL     Status: Abnormal   Collection Time    03/21/14  5:41 PM      Result Value Ref Range   WBC 15.2 (*) 4.0 - 10.5 K/uL   RBC 4.12  3.87 - 5.11 MIL/uL   Hemoglobin 14.3  12.0 - 15.0 g/dL   HCT 40.5  36.0 - 46.0 %   MCV 98.3  78.0 - 100.0 fL   MCH 34.7 (*) 26.0 - 34.0 pg   MCHC 35.3  30.0 - 36.0 g/dL   RDW 13.7  11.5 - 15.5 %   Platelets 330  150 - 400 K/uL   Neutrophils Relative % 75  43 - 77 %   Neutro Abs 11.4 (*) 1.7 - 7.7 K/uL   Lymphocytes Relative 21  12 - 46 %   Lymphs Abs 3.1  0.7 - 4.0 K/uL   Monocytes Relative 4  3 - 12 %   Monocytes Absolute 0.6  0.1 - 1.0 K/uL   Eosinophils Relative 0  0 - 5 %   Eosinophils Absolute 0.1  0.0 - 0.7 K/uL   Basophils Relative 0  0 - 1 %   Basophils Absolute 0.0  0.0 - 0.1 K/uL  PRO B NATRIURETIC PEPTIDE     Status: Abnormal   Collection Time    03/21/14  5:41 PM      Result Value Ref Range   Pro B Natriuretic peptide (BNP) 556.1 (*) 0 - 450 pg/mL  TROPONIN I     Status: None   Collection Time    03/21/14  6:38 PM      Result Value Ref Range   Troponin I <0.30  <0.30 ng/mL   Comment:            Due to the release kinetics of cTnI,     a negative result within the first hours     of the onset of symptoms does not rule out     myocardial infarction with certainty.     If myocardial infarction is still suspected,     repeat the test at appropriate intervals.  LACTIC ACID, PLASMA     Status: Abnormal   Collection Time    03/21/14  8:52 PM      Result Value Ref Range   Lactic Acid, Venous 3.6 (*) 0.5 - 2.2 mmol/L  BASIC METABOLIC PANEL     Status: Abnormal   Collection Time    03/21/14  8:52 PM      Result Value Ref Range   Sodium 140  137 - 147 mEq/L   Potassium 3.6 (*) 3.7 - 5.3 mEq/L   Chloride 99  96 - 112 mEq/L   CO2 18 (*) 19 - 32 mEq/L   Glucose, Bld 243 (*) 70 - 99 mg/dL   BUN 15  6 - 23 mg/dL   Creatinine, Ser 0.67  0.50 - 1.10 mg/dL   Calcium 9.5  8.4 - 10.5 mg/dL   GFR calc  non Af Amer 80 (*) >90 mL/min   GFR calc Af Amer >90  >90 mL/min   Comment: (NOTE)     The eGFR has been calculated using the CKD EPI equation.     This calculation has not been validated in all clinical situations.     eGFR's persistently <90 mL/min signify possible Chronic Kidney     Disease.  GLUCOSE, CAPILLARY  Status: Abnormal   Collection Time    03/21/14  9:28 PM      Result Value Ref Range   Glucose-Capillary 229 (*) 70 - 99 mg/dL  CBC     Status: Abnormal   Collection Time    03/22/14  5:00 AM      Result Value Ref Range   WBC 10.5  4.0 - 10.5 K/uL   RBC 4.12  3.87 - 5.11 MIL/uL   Hemoglobin 14.1  12.0 - 15.0 g/dL   HCT 39.9  36.0 - 46.0 %   MCV 96.8  78.0 - 100.0 fL   MCH 34.2 (*) 26.0 - 34.0 pg   MCHC 35.3  30.0 - 36.0 g/dL   RDW 13.6  11.5 - 15.5 %   Platelets 300  150 - 400 K/uL  BASIC METABOLIC PANEL     Status: Abnormal   Collection Time    03/22/14  5:00 AM      Result Value Ref Range   Sodium 140  137 - 147 mEq/L   Potassium 4.1  3.7 - 5.3 mEq/L   Chloride 102  96 - 112 mEq/L   CO2 20  19 - 32 mEq/L   Glucose, Bld 259 (*) 70 - 99 mg/dL   BUN 17  6 - 23 mg/dL   Creatinine, Ser 0.79  0.50 - 1.10 mg/dL   Calcium 9.4  8.4 - 10.5 mg/dL   GFR calc non Af Amer 76 (*) >90 mL/min   GFR calc Af Amer 88 (*) >90 mL/min   Comment: (NOTE)     The eGFR has been calculated using the CKD EPI equation.     This calculation has not been validated in all clinical situations.     eGFR's persistently <90 mL/min signify possible Chronic Kidney     Disease.  GLUCOSE, CAPILLARY     Status: Abnormal   Collection Time    03/22/14  7:41 AM      Result Value Ref Range   Glucose-Capillary 224 (*) 70 - 99 mg/dL   Comment 1 Notify RN    GLUCOSE, CAPILLARY     Status: Abnormal   Collection Time    03/22/14 11:07 AM      Result Value Ref Range   Glucose-Capillary 247 (*) 70 - 99 mg/dL   Comment 1 Notify RN    GLUCOSE, CAPILLARY     Status: Abnormal   Collection Time     03/22/14  4:23 PM      Result Value Ref Range   Glucose-Capillary 201 (*) 70 - 99 mg/dL   Comment 1 Notify RN     Comment 2 Documented in Chart    GLUCOSE, CAPILLARY     Status: Abnormal   Collection Time    03/22/14  8:02 PM      Result Value Ref Range   Glucose-Capillary 171 (*) 70 - 99 mg/dL   Comment 1 Notify RN     Comment 2 Documented in Chart    BASIC METABOLIC PANEL     Status: Abnormal   Collection Time    03/23/14  6:04 AM      Result Value Ref Range   Sodium 147  137 - 147 mEq/L   Potassium 3.7  3.7 - 5.3 mEq/L   Chloride 105  96 - 112 mEq/L   CO2 22  19 - 32 mEq/L   Glucose, Bld 224 (*) 70 - 99 mg/dL   BUN 23  6 - 23  mg/dL   Creatinine, Ser 0.79  0.50 - 1.10 mg/dL   Calcium 9.9  8.4 - 10.5 mg/dL   GFR calc non Af Amer 76 (*) >90 mL/min   GFR calc Af Amer 88 (*) >90 mL/min   Comment: (NOTE)     The eGFR has been calculated using the CKD EPI equation.     This calculation has not been validated in all clinical situations.     eGFR's persistently <90 mL/min signify possible Chronic Kidney     Disease.  CBC     Status: None   Collection Time    03/23/14  6:04 AM      Result Value Ref Range   WBC 9.6  4.0 - 10.5 K/uL   RBC 4.16  3.87 - 5.11 MIL/uL   Hemoglobin 13.9  12.0 - 15.0 g/dL   HCT 41.3  36.0 - 46.0 %   MCV 99.3  78.0 - 100.0 fL   MCH 33.4  26.0 - 34.0 pg   MCHC 33.7  30.0 - 36.0 g/dL   RDW 13.5  11.5 - 15.5 %   Platelets 243  150 - 400 K/uL  GLUCOSE, CAPILLARY     Status: Abnormal   Collection Time    03/23/14  7:16 AM      Result Value Ref Range   Glucose-Capillary 213 (*) 70 - 99 mg/dL   Comment 1 Documented in Chart     Comment 2 Notify RN    GLUCOSE, CAPILLARY     Status: Abnormal   Collection Time    03/23/14 11:26 AM      Result Value Ref Range   Glucose-Capillary 183 (*) 70 - 99 mg/dL   Comment 1 Notify RN     Comment 2 Documented in Chart      Dg Hip Complete Right  03/21/2014   CLINICAL DATA:  RIGHT hip pain post fall earlier today   EXAM: RIGHT HIP - COMPLETE 2+ VIEW  COMPARISON:  None  FINDINGS: Osseous demineralization.  Minimal narrowing of the joints bilaterally.  Displaced fracture of the RIGHT femoral neck.  No dislocation.  SI joints symmetric.  No other focal bony abnormalities identified.  IMPRESSION: Displaced RIGHT femoral neck fracture without dislocation.   Electronically Signed   By: Lavonia Dana M.D.   On: 03/21/2014 16:31   Dg Chest Port 1 View  03/21/2014   CLINICAL DATA:  [place].  Hip fracture.  EXAM: PORTABLE CHEST - 1 VIEW  COMPARISON:  Chest x-ray 02/16/2014  FINDINGS: The heart is within normal limits and stable. There is tortuosity and calcification of the thoracic aorta. There is significant vascular congestion and probable interstitial pulmonary edema. No definite pleural effusions or pneumothorax. The bony thorax is intact.  IMPRESSION: Vascular congestion and pulmonary edema.   Electronically Signed   By: Kalman Jewels M.D.   On: 03/21/2014 17:54    ROS SEE ORIGINAL H/P  Blood pressure 135/81, pulse 105, temperature 98.4 F (36.9 C), temperature source Oral, resp. rate 16, height _0  (1.626 m), weight 153 lb 3.5 oz (69.5 kg), SpO2 92.00%. Physical Exam BP 135/81  Pulse 105  Temp(Src) 98.4 F (36.9 C) (Oral)  Resp 16  Ht _1  (1.626 m)  Wt 153 lb 3.5 oz (69.5 kg)  BMI 26.29 kg/m2  SpO2 92% General appearance is normal, the patient is alert and oriented x3 with normal mood and affect. GAIT/BED BOUND 2ND FRACTURE Upper extremity exam  The right and left upper extremity:  Inspection and palpation revealed no abnormalities in the upper extremities.   Range of motion is full without MINIMAL contracture.  Motor exam is normal with grade 5 strength.  The joints are fully reduced without subluxation.  There is no atrophy or tremor and muscle tone is normal.  All joints are stable.  LEFT LEG   Inspection and palpation revealed no abnormalities in the upper extremities.   Range of motion  is full without MILD contracture.  Motor exam is normal with grade 5 strength.  The joints are fully reduced without subluxation.  There is no atrophy or tremor and muscle tone is normal.  All joints are stable.  RIGHT LEG SHORT, EXT ROT DEFORMITY ROM AND MOTOR DEFER BEC FRACTURE  JOINTS OF KNEE ANKLE REDUCED  NO TREMORS    Assessment/Plan: Pulmonary edema on admit, diuresis with LASIX  CARDIOLOGY AND HOSPITALIST CLEARED FOR SURGERY, BUT SHE HAD PLAVIX WEDNES AM SURGERY CAN BE DONE TUES OR [facility] BIPOLAR REPLACEMENT   Carole Civil 03/23/2014, 2:17 PM

## 2014-03-23 NOTE — Progress Notes (Signed)
Inpatient Diabetes Program Recommendations  AACE/ADA: New Consensus Statement on Inpatient Glycemic Control (2013)  Target Ranges:  Prepandial:   less than 140 mg/dL      Peak postprandial:   less than 180 mg/dL (1-2 hours)      Critically ill patients:  140 - 180 mg/dL   Results for [name], [name] (MRN [id number]) as of 03/23/2014 08:47  Ref. Range 03/22/2014 07:41 03/22/2014 11:07 03/22/2014 16:23 03/22/2014 20:02 03/23/2014 07:16  Glucose-Capillary Latest Range: 70-99 mg/dL 224 (H) 247 (H) 201 (H) 171 (H) 213 (H)   Diabetes history: DM  Outpatient Diabetes medications: Lantus 3-8 units depending on CBG, Novolog 3 units with supper, Glucovance 5-500 mg BID  Current orders for Inpatient glycemic control: Lantus 10 units QHS, Novolog 0-9 units AC  Inpatient Diabetes Program Recommendations Insulin - Basal: Please consider increasing Lantus to 12 units QHS. Correction (SSI): Please consider increasing Novolog correction to moderate scale and add Novolog bedtime correction scale.  Thanks, Barnie Alderman, RN, MSN, CCRN Diabetes Coordinator Inpatient Diabetes Program [phone] (Team Pager) [phone] ([facility] office) [phone] [facility] office)

## 2014-03-23 NOTE — Progress Notes (Signed)
Primary cardiologist: Dr. Darlina Guys  Consulting cardiologist: Dr. Satira Sark  Subjective:   Complains of hip pain. No chest pain or breathlessness at rest.   Objective:   Temp:  [98.3 F (36.8 C)-98.9 F (37.2 C)] 98.4 F (36.9 C) (05/08 0852) Pulse Rate:  [93-116] 105 (05/08 0852) Resp:  [14-20] 16 (05/08 0852) BP: (124-175)/(78-104) 135/81 mmHg (05/08 0852) SpO2:  [91 %-100 %] 92 % (05/08 0852) Last BM Date: 03/22/14  Filed Weights   03/21/14 2120  Weight: 153 lb 3.5 oz (69.5 kg)   Telemetry: Sinus rhythm and sinus tachycardia.  Exam:  No distress.    Lungs: Clear to auscultation with few crackles at the bases, nonlabored breathing at rest.   Cardiac: Regular rate and rhythm, no S3, soft systolic murmur, no pericardial rub.   Extremities: No pitting edema, distal pulses 1-2+. Right leg shortened.    Lab Results:  Basic Metabolic Panel:  Recent Labs Lab 03/21/14 2052 03/22/14 0500 03/23/14 0604  NA 140 140 147  K 3.6* 4.1 3.7  CL 99 102 105  CO2 18* 20 22  GLUCOSE 243* 259* 224*  BUN _0 CREATININE 0.67 0.79 0.79  CALCIUM 9.5 9.4 9.9    Liver Function Tests:  Recent Labs Lab 03/21/14 1741  AST 28  ALT 18  ALKPHOS 152*  BILITOT 0.9  PROT 7.8  ALBUMIN 3.9    CBC:  Recent Labs Lab 03/21/14 1741 03/22/14 0500 03/23/14 0604  WBC 15.2* 10.5 9.6  HGB 14.3 14.1 13.9  HCT 40.5 39.9 41.3  MCV 98.3 96.8 99.3  PLT 330 300 243    Cardiac Enzymes:  Recent Labs Lab 03/21/14 1838  TROPONINI <0.30    BNP:  Recent Labs  06/24/13 0513 02/18/14 0724 03/21/14 1741  PROBNP 768.3* 285.7 556.1*    ECG: Sinus rhythm with biatrial enlargement, left axis, repolarization abnormalities - cannot exclude ischemic changes.  Medications:   Scheduled Medications: . ALPRAZolam  0.25 mg Oral BID  . atorvastatin  10 mg Oral q1800  . docusate sodium  100 mg Oral QHS  . enoxaparin (LOVENOX) injection  40 mg Subcutaneous QHS   . furosemide  40 mg Intravenous Daily  . insulin aspart  0-9 Units Subcutaneous TID WC  . insulin glargine  10 Units Subcutaneous QHS  . lisinopril  20 mg Oral Daily  . metoprolol succinate  50 mg Oral Daily  . mirtazapine  30 mg Oral QHS  . spironolactone  25 mg Oral Daily      PRN Medications:  HYDROcodone-acetaminophen, methocarbamol (ROBAXIN) IV, methocarbamol, morphine injection   Assessment:   1. Preoperative evaluation pending anticipated surgical repair of a right femoral neck fracture. Patient had a recent fall without syncope. Cardiac markers argue against ACS, currently in sinus rhythm. She did have pulmonary edema at presentation. Recent echocardiogram showed vigorous LVEF with hypertrophic pattern, septal hypertrophy with LVOT gradient. She is on beta blocker at baseline as well as Aldactone and  IV Lasix. Intake and output are incomplete.   2. Fall with subsequent right femoral neck fracture.   3. Severe bilateral carotid artery disease, total occlusions. This has been managed with statin therapy and antiplatelet management. Patient seen by neurology in April of this year - see discharge summary for details.   4. History of mild, nonobstructive CAD as of cardiac catheterization May 2011. No obvious angina. This has been managed medically.   5. Type 2 diabetes mellitus.   6. Hypertension,  also history of orthostasis.   7. UTI, on antibiotics.   8. History of PSVT, on beta blocker. No obvious recurrence.  9. Dementia.   Plan/Discussion:    Continue present cardiac medical regimen including IV Lasix. Watch intake and output, also telemetry. Overall she is at least at moderate perioperative risk with hip surgery.   Satira Sark, M.D., F.A.C.C.

## 2014-03-24 ENCOUNTER — Inpatient Hospital Stay (HOSPITAL_COMMUNITY): Payer: Medicare Other

## 2014-03-24 LAB — BASIC METABOLIC PANEL
BUN: 27 mg/dL — ABNORMAL HIGH (ref 6–23)
CO2: 23 mEq/L (ref 19–32)
Calcium: 10.1 mg/dL (ref 8.4–10.5)
Chloride: 105 mEq/L (ref 96–112)
Creatinine, Ser: 0.8 mg/dL (ref 0.50–1.10)
GFR calc Af Amer: 78 mL/min — ABNORMAL LOW (ref >90)
GFR calc non Af Amer: 67 mL/min — ABNORMAL LOW (ref >90)
Glucose, Bld: 211 mg/dL — ABNORMAL HIGH (ref 70–99)
Potassium: 3.7 mEq/L (ref 3.7–5.3)
Sodium: 146 mEq/L (ref 137–147)

## 2014-03-24 LAB — GLUCOSE, CAPILLARY
Glucose-Capillary: 208 mg/dL — ABNORMAL HIGH (ref 70–99)
Glucose-Capillary: 221 mg/dL — ABNORMAL HIGH (ref 70–99)
Glucose-Capillary: 181 mg/dL — ABNORMAL HIGH (ref 70–99)
Glucose-Capillary: 203 mg/dL — ABNORMAL HIGH (ref 70–99)

## 2014-03-24 MED ORDER — FUROSEMIDE 10 MG/ML IJ SOLN
60.0000 mg | Freq: Two times a day (BID) | INTRAMUSCULAR | Status: DC
  Administered 2014-03-24 – 2014-03-26 (×4): 60 mg via INTRAVENOUS
  Filled 2014-03-24 (×4): qty 6

## 2014-03-24 MED ORDER — POLYETHYLENE GLYCOL 3350 17 G PO PACK
17.0000 g | PACK | Freq: Every day | ORAL | Status: DC
  Administered 2014-03-24 – 2014-03-29 (×5): 17 g via ORAL
  Filled 2014-03-24 (×5): qty 1

## 2014-03-24 MED ORDER — BISACODYL 5 MG PO TBEC
10.0000 mg | DELAYED_RELEASE_TABLET | Freq: Once | ORAL | Status: AC
  Administered 2014-03-24: 10 mg via ORAL
  Filled 2014-03-24: qty 2

## 2014-03-24 MED ORDER — GUAIFENESIN ER 600 MG PO TB12
1200.0000 mg | ORAL_TABLET | Freq: Two times a day (BID) | ORAL | Status: DC
  Administered 2014-03-24 – 2014-03-30 (×11): 1200 mg via ORAL
  Filled 2014-03-24 (×11): qty 2

## 2014-03-24 NOTE — Progress Notes (Signed)
[facility] PROGRESS NOTE  [name] EKC:[id number] DOB: 03-Jan-1931 DOA: 03/21/2014 PCP: [name], MD  Assessment/Plan: 1. Right hip fracture. Orthopedics following. Since patient is on Plavix last dose was on 5/6, her surgery will need to be delayed at least 5 days. Tentative plans for surgery will be next week. 2. Acute diastolic congestive heart failure. Start on intravenous Lasix. Cardiology following. Per intake and output, urine output has been poor.  Chest xray shows persistent edema, will increase lasix dosing. 3. Acute respiratory failure due to #2. Wean off oxygen as tolerated. Repeat chest xray shows worsening right lower lobe process, concerning for atelectasis, pneumonia or worsening edema.  Since patient does not have fever or leukocytosis, will hold off on antibiotics. Encouraged incentive spirometry 4. History of coronary disease. Plavix on hold secondary to #1 and need for surgery. Will resume when able. 5. Possible UTI. Patient did complain of dysuria, but urinalysis does not indicate a need for infection. Will discontinue Levaquin for now. 6. Mild metabolic acidosis. Lactic acid was elevated. This appears to be resolving. 7. Diabetes. On Lantus and sliding scale insulin.  Code Status: DNR Family Communication: discussed with daughter at the bedside Disposition Plan: pending hospital course, suspect SNF placement   Consultants:  Cardiology  Orthopedics  Procedures:    Antibiotics:  levaquin 5/6>>5/7  HPI/Subjective: Denies any new complaints.  Family reports that she may be coughing.  Had some left sided abdominal pain  Objective: Filed Vitals:   03/24/14 1600  BP:   Pulse:   Temp:   Resp: 18    Intake/Output Summary (Last 24 hours) at 03/24/14 1830 Last data filed at 03/24/14 1443  Gross per 24 hour  Intake     60 ml  Output    700 ml  Net   -640 ml   Filed Weights   03/21/14 2120  Weight: 69.5 kg (153 lb 3.5 oz)     Exam:   General:  NAD  Cardiovascular: S1, S2 RRR  Respiratory: crackles at bases  Abdomen: soft, nt, nd, bs+  Musculoskeletal: No edema in LE bilaterally   Data Reviewed: Basic Metabolic Panel:  Recent Labs Lab 03/21/14 1741 03/21/14 2052 03/22/14 0500 03/23/14 0604 03/24/14 0636  NA 139 140 140 147 146  K 3.2* 3.6* 4.1 3.7 3.7  CL 99 99 102 105 105  CO2 16* 18* _0 GLUCOSE 216* 243* 259* 224* 211*  BUN _1 27*  CREATININE 0.61 0.67 0.79 0.79 0.80  CALCIUM 9.6 9.5 9.4 9.9 10.1   Liver Function Tests:  Recent Labs Lab 03/21/14 1741  AST 28  ALT 18  ALKPHOS 152*  BILITOT 0.9  PROT 7.8  ALBUMIN 3.9    Recent Labs Lab 03/21/14 1741  LIPASE 23   No results found for this basename: AMMONIA,  in the last 168 hours CBC:  Recent Labs Lab 03/21/14 1741 03/22/14 0500 03/23/14 0604  WBC 15.2* 10.5 9.6  NEUTROABS 11.4*  --   --   HGB 14.3 14.1 13.9  HCT 40.5 39.9 41.3  MCV 98.3 96.8 99.3  PLT 330 300 243   Cardiac Enzymes:  Recent Labs Lab 03/21/14 1838  TROPONINI <0.30   BNP (last 3 results)  Recent Labs  06/24/13 0513 02/18/14 0724 03/21/14 1741  PROBNP 768.3* 285.7 556.1*   CBG:  Recent Labs Lab 03/23/14 1715 03/23/14 2049 03/24/14 0738 03/24/14 1122 03/24/14 1656  GLUCAP 153* 210* 208* 221* 181*    Recent  Results (from the past 240 hour(s))  URINE CULTURE     Status: None   Collection Time    03/21/14  5:35 PM      Result Value Ref Range Status   Specimen Description URINE, CATHETERIZED   Final   Special Requests NONE   Final   Culture  Setup Time     Final   Value: 03/21/2014 22:00     Performed at [place]     Final   Value: NO GROWTH     Performed at [facility]   Culture     Final   Value: NO GROWTH     Performed at [facility]   Report Status 03/23/2014 FINAL   Final     Studies: Dg Chest Port 1 View  03/24/2014   CLINICAL DATA:  Cough and  congestion.  EXAM: PORTABLE CHEST - 1 VIEW  COMPARISON:  Single view of the chest 03/21/2014 and 02/16/2014.  FINDINGS: Right worse than left airspace disease persists. Aeration in the right lung base has worsened. Heart size is upper normal. No pneumothorax identified. There is likely a small right pleural effusion.  IMPRESSION: Persistent pulmonary edema. Worsened aeration in the right base could be due to atelectasis, more intense edema or pneumonia.   Electronically Signed   By: Inge Rise M.D.   On: 03/24/2014 17:09    Scheduled Meds: . ALPRAZolam  0.25 mg Oral BID  . atorvastatin  10 mg Oral q1800  . bisacodyl  10 mg Oral Once  . docusate sodium  100 mg Oral QHS  . enoxaparin (LOVENOX) injection  40 mg Subcutaneous QHS  . furosemide  60 mg Intravenous Q12H  . guaiFENesin  1,200 mg Oral BID  . insulin aspart  0-9 Units Subcutaneous TID WC  . insulin glargine  10 Units Subcutaneous QHS  . lisinopril  20 mg Oral Daily  . metoprolol succinate  50 mg Oral Daily  . mirtazapine  30 mg Oral QHS  . polyethylene glycol  17 g Oral Daily  . spironolactone  25 mg Oral Daily   Continuous Infusions:   Principal Problem:   Hip fracture, right Active Problems:   DIABETES MELLITUS, TYPE II, UNCONTROLLED   Fall   UTI (lower urinary tract infection)   Hip fracture   Acute diastolic congestive heart failure   Preoperative cardiovascular examination    Time spent: 46mns    [name]  [facility] Pager [phone] If 7PM-7AM, please contact night-coverage at [email or web address], password [email or web address]5/07/2014, 6:30 PM  LOS: 3 days

## 2014-03-24 NOTE — Progress Notes (Signed)
Patient has low urinary output of 75cc in her foley bag. Contacted Dr. Darrick Meigs to inform of this. Order received to hold lasix and check BUN/Creatinine in morning labs. Patient in no acute distress. Will continue to monitor closely.

## 2014-03-24 NOTE — Progress Notes (Signed)
Patient ID: [name], female   DOB: 1931-05-30, 80 y.o.   MRN: [id number] [name] is preop for surgery once her Plavix has worn off.

## 2014-03-25 ENCOUNTER — Inpatient Hospital Stay (HOSPITAL_COMMUNITY): Payer: Medicare Other

## 2014-03-25 LAB — BASIC METABOLIC PANEL
BUN: 34 mg/dL — ABNORMAL HIGH (ref 6–23)
CO2: 23 mEq/L (ref 19–32)
Calcium: 9.8 mg/dL (ref 8.4–10.5)
Chloride: 103 mEq/L (ref 96–112)
Creatinine, Ser: 0.78 mg/dL (ref 0.50–1.10)
GFR calc Af Amer: 88 mL/min — ABNORMAL LOW (ref >90)
GFR calc non Af Amer: 76 mL/min — ABNORMAL LOW (ref >90)
Glucose, Bld: 249 mg/dL — ABNORMAL HIGH (ref 70–99)
Potassium: 3.4 mEq/L — ABNORMAL LOW (ref 3.7–5.3)
Sodium: 144 mEq/L (ref 137–147)

## 2014-03-25 LAB — URINALYSIS, ROUTINE W REFLEX MICROSCOPIC
Glucose, UA: NEGATIVE mg/dL
Ketones, ur: NEGATIVE mg/dL
Nitrite: NEGATIVE
Protein, ur: NEGATIVE mg/dL
Specific Gravity, Urine: 1.025 (ref 1.005–1.030)
Urobilinogen, UA: 0.2 mg/dL (ref 0.0–1.0)
pH: 5.5 (ref 5.0–8.0)

## 2014-03-25 LAB — URINE MICROSCOPIC-ADD ON

## 2014-03-25 LAB — CBC
HCT: 37 % (ref 36.0–46.0)
Hemoglobin: 12.6 g/dL (ref 12.0–15.0)
MCH: 34.2 pg — ABNORMAL HIGH (ref 26.0–34.0)
MCHC: 34.1 g/dL (ref 30.0–36.0)
MCV: 100.5 fL — ABNORMAL HIGH (ref 78.0–100.0)
Platelets: 235 10*3/uL (ref 150–400)
RBC: 3.68 MIL/uL — ABNORMAL LOW (ref 3.87–5.11)
RDW: 13.8 % (ref 11.5–15.5)
WBC: 7.2 10*3/uL (ref 4.0–10.5)

## 2014-03-25 LAB — GLUCOSE, CAPILLARY
Glucose-Capillary: 227 mg/dL — ABNORMAL HIGH (ref 70–99)
Glucose-Capillary: 198 mg/dL — ABNORMAL HIGH (ref 70–99)
Glucose-Capillary: 269 mg/dL — ABNORMAL HIGH (ref 70–99)
Glucose-Capillary: 318 mg/dL — ABNORMAL HIGH (ref 70–99)

## 2014-03-25 MED ORDER — POLYVINYL ALCOHOL 1.4 % OP SOLN
1.0000 [drp] | OPHTHALMIC | Status: DC | PRN
  Administered 2014-03-25 – 2014-03-28 (×2): 1 [drp] via OPHTHALMIC
  Filled 2014-03-25: qty 15

## 2014-03-25 NOTE — Progress Notes (Signed)
Patient and family called out this morning stating that the patient was having difficulty breathing.  Upon entering room patient does not appear to be in any distress.  Lungs clear in upper lobes and diminished at bases upon auscultation.  O2 stats 93% on 2L O2.  Dose of IV lasix given as ordered and MD notified.  Patient does seem a little anxious, will give xanax as ordered this morning as well.  Will continue to monitor

## 2014-03-25 NOTE — Progress Notes (Signed)
Patients daughter voiced concerns over patients L eye and L extremity mobility this evening.  Upon my assessment patient is normal compared to status of previous days.  Pupils are WNL  When asked to squeeze hands, strength is weak, but even in both hands.  Patient unable to lift legs, but will wiggle toes on each foot appropriately.  Bruising was noted to L hand though, possibly from fall? Findings and concerns discussed with MD.

## 2014-03-25 NOTE — Progress Notes (Signed)
[facility] PROGRESS NOTE  [name] DFG:[id number] DOB: [birth year] DOA: 03/21/2014 PCP: Michele Gravel, MD  Assessment/Plan: 1. Right hip fracture. Orthopedics following. Since patient is on Plavix last dose was on 5/6, her surgery will need to be delayed at least 5 days. Tentative plans for surgery will be next week. 2. Acute diastolic congestive heart failure. On intravenous Lasix. Cardiology following. Urine output has not been impressive.  Lasix dose was increased yesterday.  Will continue to monitor urine output. 3. Acute respiratory failure due to #2. Wean off oxygen as tolerated. Repeat chest xray shows worsening right lower lobe process, concerning for atelectasis, pneumonia or worsening edema.  Since patient does not have fever or leukocytosis, will hold off on antibiotics. Encouraged incentive spirometry.  Patient takes very shallow breaths and it is likely that atelectasis is playing a role. 4. History of coronary disease. Plavix on hold secondary to #1 and need for surgery. Will resume when able. 5. Possible UTI. Patient did complain of dysuria, but urinalysis does not indicate signs of infection. Will discontinue Levaquin for now. 6. Mild metabolic acidosis. Lactic acid was elevated. This appears to be resolving. 7. Diabetes. On Lantus and sliding scale insulin.  Code Status: DNR Family Communication: discussed with daughter at the bedside Disposition Plan: pending hospital course, suspect SNF placement   Consultants:  Cardiology  Orthopedics  Procedures:    Antibiotics:  levaquin 5/6>>5/7  HPI/Subjective: Patient had episode of shortness of breath this morning.  Family describes labored breathing. Seems to have resolved at this time.  Patient resting comfortably  Objective: Filed Vitals:   03/25/14 1159  BP:   Pulse:   Temp:   Resp: 16    Intake/Output Summary (Last 24 hours) at 03/25/14 1308 Last data filed at 03/25/14 0458  Gross per 24 hour   Intake     40 ml  Output    700 ml  Net   -660 ml   Filed Weights   03/21/14 2120  Weight: 69.5 kg (153 lb 3.5 oz)    Exam:   General:  NAD  Cardiovascular: S1, S2 RRR  Respiratory: crackles at bases  Abdomen: soft, nt, nd, bs+  Musculoskeletal: No edema in LE bilaterally   Data Reviewed: Basic Metabolic Panel:  Recent Labs Lab 03/21/14 2052 03/22/14 0500 03/23/14 0604 03/24/14 0636 03/25/14 0605  NA 140 140 147 146 144  K 3.6* 4.1 3.7 3.7 3.4*  CL 99 102 105 105 103  CO2 18* _0 GLUCOSE 243* 259* 224* 211* 249*  BUN _1 27* 34*  CREATININE 0.67 0.79 0.79 0.80 0.78  CALCIUM 9.5 9.4 9.9 10.1 9.8   Liver Function Tests:  Recent Labs Lab 03/21/14 1741  AST 28  ALT 18  ALKPHOS 152*  BILITOT 0.9  PROT 7.8  ALBUMIN 3.9    Recent Labs Lab 03/21/14 1741  LIPASE 23   No results found for this basename: AMMONIA,  in the last 168 hours CBC:  Recent Labs Lab 03/21/14 1741 03/22/14 0500 03/23/14 0604 03/25/14 0605  WBC 15.2* 10.5 9.6 7.2  NEUTROABS 11.4*  --   --   --   HGB 14.3 14.1 13.9 12.6  HCT 40.5 39.9 41.3 37.0  MCV 98.3 96.8 99.3 100.5*  PLT 330 300 243 235   Cardiac Enzymes:  Recent Labs Lab 03/21/14 1838  TROPONINI <0.30   BNP (last 3 results)  Recent Labs  06/24/13 0513 02/18/14 0724 03/21/14 1741  PROBNP 768.3*  285.7 556.1*   CBG:  Recent Labs Lab 03/24/14 1122 03/24/14 1656 03/24/14 2122 03/25/14 0800 03/25/14 1140  GLUCAP 221* 181* 203* 227* 198*    Recent Results (from the past 240 hour(s))  URINE CULTURE     Status: None   Collection Time    03/21/14  5:35 PM      Result Value Ref Range Status   Specimen Description URINE, CATHETERIZED   Final   Special Requests NONE   Final   Culture  Setup Time     Final   Value: 03/21/2014 22:00     Performed at [place]     Final   Value: NO GROWTH     Performed at [facility]   Culture     Final   Value: NO  GROWTH     Performed at [facility]   Report Status 03/23/2014 FINAL   Final     Studies: Dg Chest Port 1 View  03/24/2014   CLINICAL DATA:  Cough and congestion.  EXAM: PORTABLE CHEST - 1 VIEW  COMPARISON:  Single view of the chest 03/21/2014 and 02/16/2014.  FINDINGS: Right worse than left airspace disease persists. Aeration in the right lung base has worsened. Heart size is upper normal. No pneumothorax identified. There is likely a small right pleural effusion.  IMPRESSION: Persistent pulmonary edema. Worsened aeration in the right base could be due to atelectasis, more intense edema or pneumonia.   Electronically Signed   By: Inge Rise M.D.   On: 03/24/2014 17:09    Scheduled Meds: . ALPRAZolam  0.25 mg Oral BID  . atorvastatin  10 mg Oral q1800  . docusate sodium  100 mg Oral QHS  . enoxaparin (LOVENOX) injection  40 mg Subcutaneous QHS  . furosemide  60 mg Intravenous Q12H  . guaiFENesin  1,200 mg Oral BID  . insulin aspart  0-9 Units Subcutaneous TID WC  . insulin glargine  10 Units Subcutaneous QHS  . lisinopril  20 mg Oral Daily  . metoprolol succinate  50 mg Oral Daily  . mirtazapine  30 mg Oral QHS  . polyethylene glycol  17 g Oral Daily  . spironolactone  25 mg Oral Daily   Continuous Infusions:   Principal Problem:   Hip fracture, right Active Problems:   DIABETES MELLITUS, TYPE II, UNCONTROLLED   Fall   UTI (lower urinary tract infection)   Hip fracture   Acute diastolic congestive heart failure   Preoperative cardiovascular examination    Time spent: 74mns    [name]  [facility] Pager [phone] If 7PM-7AM, please contact night-coverage at [email or web address], password [email or web address]03/25/2014, 1:08 PM  LOS: 4 days

## 2014-03-26 LAB — BASIC METABOLIC PANEL
BUN: 41 mg/dL — ABNORMAL HIGH (ref 6–23)
CO2: 23 mEq/L (ref 19–32)
Calcium: 9.6 mg/dL (ref 8.4–10.5)
Chloride: 100 mEq/L (ref 96–112)
Creatinine, Ser: 0.88 mg/dL (ref 0.50–1.10)
GFR calc Af Amer: 70 mL/min — ABNORMAL LOW (ref >90)
GFR calc non Af Amer: 60 mL/min — ABNORMAL LOW (ref >90)
Glucose, Bld: 313 mg/dL — ABNORMAL HIGH (ref 70–99)
Potassium: 3.7 mEq/L (ref 3.7–5.3)
Sodium: 141 mEq/L (ref 137–147)

## 2014-03-26 LAB — GLUCOSE, CAPILLARY
Glucose-Capillary: 276 mg/dL — ABNORMAL HIGH (ref 70–99)
Glucose-Capillary: 292 mg/dL — ABNORMAL HIGH (ref 70–99)
Glucose-Capillary: 257 mg/dL — ABNORMAL HIGH (ref 70–99)
Glucose-Capillary: 313 mg/dL — ABNORMAL HIGH (ref 70–99)

## 2014-03-26 LAB — CBC
HCT: 37.6 % (ref 36.0–46.0)
Hemoglobin: 12.8 g/dL (ref 12.0–15.0)
MCH: 34.4 pg — ABNORMAL HIGH (ref 26.0–34.0)
MCHC: 34 g/dL (ref 30.0–36.0)
MCV: 101.1 fL — ABNORMAL HIGH (ref 78.0–100.0)
Platelets: 238 10*3/uL (ref 150–400)
RBC: 3.72 MIL/uL — ABNORMAL LOW (ref 3.87–5.11)
RDW: 13.6 % (ref 11.5–15.5)
WBC: 6.7 10*3/uL (ref 4.0–10.5)

## 2014-03-26 MED ORDER — INSULIN GLARGINE 100 UNIT/ML ~~LOC~~ SOLN
14.0000 [IU] | Freq: Every day | SUBCUTANEOUS | Status: DC
  Administered 2014-03-26 – 2014-03-29 (×4): 14 [IU] via SUBCUTANEOUS
  Filled 2014-03-26 (×7): qty 0.14

## 2014-03-26 MED ORDER — INSULIN ASPART 100 UNIT/ML ~~LOC~~ SOLN
0.0000 [IU] | Freq: Three times a day (TID) | SUBCUTANEOUS | Status: DC
Start: 2014-03-27 — End: 2014-03-30
  Administered 2014-03-27 (×2): 8 [IU] via SUBCUTANEOUS
  Administered 2014-03-27: 3 [IU] via SUBCUTANEOUS
  Administered 2014-03-28 (×2): 5 [IU] via SUBCUTANEOUS
  Administered 2014-03-29: 3 [IU] via SUBCUTANEOUS
  Administered 2014-03-29 (×2): 5 [IU] via SUBCUTANEOUS
  Administered 2014-03-30: 8 [IU] via SUBCUTANEOUS
  Administered 2014-03-30: 5 [IU] via SUBCUTANEOUS

## 2014-03-26 MED ORDER — INSULIN ASPART 100 UNIT/ML ~~LOC~~ SOLN
0.0000 [IU] | Freq: Every day | SUBCUTANEOUS | Status: DC
  Administered 2014-03-26: 4 [IU] via SUBCUTANEOUS
  Administered 2014-03-27: 2 [IU] via SUBCUTANEOUS

## 2014-03-26 NOTE — Progress Notes (Signed)
[facility] PROGRESS NOTE  [name] RFF:[id number] DOB: 10-16-1931 DOA: 03/21/2014 PCP: [name], MD  Assessment/Plan: 1. Right hip fracture. Orthopedics following. Since patient is on Plavix last dose was on 5/6, her surgery will need to be delayed at least 5 days. Plans are for operative repair on Tuesday or Wednesday per orthopedics. 2. Acute diastolic congestive heart failure. On intravenous Lasix. Cardiology following. Urine output was fair yesterday. Further lasix per cardiology.  Will continue to monitor urine output. 3. Acute respiratory failure due to #2. Wean off oxygen as tolerated. Repeat chest xray shows worsening right lower lobe process, concerning for atelectasis, pneumonia or worsening edema.  Since patient does not have fever or leukocytosis, will hold off on antibiotics. Encouraged incentive spirometry.  Patient takes very shallow breaths and it is likely that atelectasis is playing a role. 4. History of coronary disease. Plavix on hold secondary to #1 and need for surgery. Will resume when able. 5. Mild metabolic acidosis. Lactic acid was elevated. This has resolved. 6. Diabetes. On Lantus and sliding scale insulin.  Code Status: DNR Family Communication: discussed with son at the bedside Disposition Plan: pending hospital course, suspect SNF placement   Consultants:  Cardiology  Orthopedics  Procedures:    Antibiotics:  levaquin 5/6>>5/7  HPI/Subjective: More talkative today.  Denies any complaints.  Objective: Filed Vitals:   03/26/14 1600  BP:   Pulse:   Temp:   Resp: 16    Intake/Output Summary (Last 24 hours) at 03/26/14 1853 Last data filed at 03/26/14 0600  Gross per 24 hour  Intake      0 ml  Output   1000 ml  Net  -1000 ml   Filed Weights   03/21/14 2120  Weight: 69.5 kg (153 lb 3.5 oz)    Exam:   General:  NAD  Cardiovascular: S1, S2 RRR  Respiratory: CTA B  Abdomen: soft, nt, nd, bs+  Musculoskeletal: No  edema in LE bilaterally   Data Reviewed: Basic Metabolic Panel:  Recent Labs Lab 03/22/14 0500 03/23/14 0604 03/24/14 0636 03/25/14 0605 03/26/14 0635  NA 140 147 146 144 141  K 4.1 3.7 3.7 3.4* 3.7  CL 102 105 105 103 100  CO2 _0 GLUCOSE 259* 224* 211* 249* 313*  BUN 17 23 27* 34* 41*  CREATININE 0.79 0.79 0.80 0.78 0.88  CALCIUM 9.4 9.9 10.1 9.8 9.6   Liver Function Tests:  Recent Labs Lab 03/21/14 1741  AST 28  ALT 18  ALKPHOS 152*  BILITOT 0.9  PROT 7.8  ALBUMIN 3.9    Recent Labs Lab 03/21/14 1741  LIPASE 23   No results found for this basename: AMMONIA,  in the last 168 hours CBC:  Recent Labs Lab 03/21/14 1741 03/22/14 0500 03/23/14 0604 03/25/14 0605 03/26/14 0635  WBC 15.2* 10.5 9.6 7.2 6.7  NEUTROABS 11.4*  --   --   --   --   HGB 14.3 14.1 13.9 12.6 12.8  HCT 40.5 39.9 41.3 37.0 37.6  MCV 98.3 96.8 99.3 100.5* 101.1*  PLT 330 300 243 235 238   Cardiac Enzymes:  Recent Labs Lab 03/21/14 1838  TROPONINI <0.30   BNP (last 3 results)  Recent Labs  06/24/13 0513 02/18/14 0724 03/21/14 1741  PROBNP 768.3* 285.7 556.1*   CBG:  Recent Labs Lab 03/25/14 1140 03/25/14 1642 03/25/14 2040 03/26/14 0743 03/26/14 1134  GLUCAP 198* 269* 318* 276* 292*    Recent Results (from the  past 240 hour(s))  URINE CULTURE     Status: None   Collection Time    03/21/14  5:35 PM      Result Value Ref Range Status   Specimen Description URINE, CATHETERIZED   Final   Special Requests NONE   Final   Culture  Setup Time     Final   Value: 03/21/2014 22:00     Performed at [place]     Final   Value: NO GROWTH     Performed at [facility]   Culture     Final   Value: NO GROWTH     Performed at [facility]   Report Status 03/23/2014 FINAL   Final     Studies: Dg Shoulder Left Port  03/25/2014   CLINICAL DATA:  Recent injury with pain  EXAM: PORTABLE LEFT SHOULDER - 2+ VIEW   COMPARISON:  None.  FINDINGS: There is no evidence of fracture or dislocation. There is no evidence of arthropathy or other focal bone abnormality. Soft tissues are unremarkable.  IMPRESSION: No acute abnormality noted.   Electronically Signed   By: Inez Catalina M.D.   On: 03/25/2014 20:27    Scheduled Meds: . ALPRAZolam  0.25 mg Oral BID  . atorvastatin  10 mg Oral q1800  . docusate sodium  100 mg Oral QHS  . enoxaparin (LOVENOX) injection  40 mg Subcutaneous QHS  . guaiFENesin  1,200 mg Oral BID  . insulin aspart  0-9 Units Subcutaneous TID WC  . insulin glargine  10 Units Subcutaneous QHS  . lisinopril  20 mg Oral Daily  . metoprolol succinate  50 mg Oral Daily  . mirtazapine  30 mg Oral QHS  . polyethylene glycol  17 g Oral Daily  . spironolactone  25 mg Oral Daily   Continuous Infusions:   Principal Problem:   Hip fracture, right Active Problems:   DIABETES MELLITUS, TYPE II, UNCONTROLLED   Fall   UTI (lower urinary tract infection)   Hip fracture   Acute diastolic congestive heart failure   Preoperative cardiovascular examination    Time spent: 34mns    Jehanzeb Memon  [facility] Pager [phone] If 7PM-7AM, please contact night-coverage at [email or web address], password [email or web address]5/09/2014, 6:53 PM  LOS: 5 days

## 2014-03-26 NOTE — Progress Notes (Signed)
Patient ID: [name], female   DOB: 10/12/1931, 80 y.o.   MRN: [id number]    Subjective:    Patient denies any SOB  Objective:   Temp:  [97.6 F (36.4 C)-99.4 F (37.4 C)] 98.3 F (36.8 C) (05/11 0641) Pulse Rate:  [78-104] 104 (05/11 0641) Resp:  [16-20] 20 (05/11 0641) BP: (98-140)/(64-79) 138/79 mmHg (05/11 0641) SpO2:  [92 %-96 %] 94 % (05/11 0641) Last BM Date: 03/22/14  Filed Weights   03/21/14 2120  Weight: 153 lb 3.5 oz (69.5 kg)    Intake/Output Summary (Last 24 hours) at 03/26/14 0829 Last data filed at 03/26/14 0600  Gross per 24 hour  Intake    100 ml  Output   2000 ml  Net  -1900 ml    Telemetry:  NSR  Exam:  General: NAD  Resp: CTAB  Cardiac: RRR, 3/6 systolic murmur LLSB  GI: soft, NT, ND  Neuro: no focal deficits  Psych: appropriate affect  Lab Results:  Basic Metabolic Panel:  Recent Labs Lab 03/24/14 0636 03/25/14 0605 03/26/14 0635  NA 146 144 141  K 3.7 3.4* 3.7  CL 105 103 100  CO2 _0 GLUCOSE 211* 249* 313*  BUN 27* 34* 41*  CREATININE 0.80 0.78 0.88  CALCIUM 10.1 9.8 9.6    Liver Function Tests:  Recent Labs Lab 03/21/14 1741  AST 28  ALT 18  ALKPHOS 152*  BILITOT 0.9  PROT 7.8  ALBUMIN 3.9    CBC:  Recent Labs Lab 03/23/14 0604 03/25/14 0605 03/26/14 0635  WBC 9.6 7.2 6.7  HGB 13.9 12.6 12.8  HCT 41.3 37.0 37.6  MCV 99.3 100.5* 101.1*  PLT 243 235 238    Cardiac Enzymes:  Recent Labs Lab 03/21/14 1838  TROPONINI <0.30    BNP:  Recent Labs  06/24/13 0513 02/18/14 0724 03/21/14 1741  PROBNP 768.3* 285.7 556.1*    Coagulation: No results found for this basename: INR,  in the last 168 hours  ECG:   Medications:   Scheduled Medications: . ALPRAZolam  0.25 mg Oral BID  . atorvastatin  10 mg Oral q1800  . docusate sodium  100 mg Oral QHS  . enoxaparin (LOVENOX) injection  40 mg Subcutaneous QHS  . furosemide  60 mg Intravenous Q12H  . guaiFENesin  1,200 mg Oral BID    . insulin aspart  0-9 Units Subcutaneous TID WC  . insulin glargine  10 Units Subcutaneous QHS  . lisinopril  20 mg Oral Daily  . metoprolol succinate  50 mg Oral Daily  . mirtazapine  30 mg Oral QHS  . polyethylene glycol  17 g Oral Daily  . spironolactone  25 mg Oral Daily     Infusions:     PRN Medications:  HYDROcodone-acetaminophen, methocarbamol (ROBAXIN) IV, methocarbamol, morphine injection, polyvinyl alcohol   Echo LVEF 65-70%, severe LVH, grade I diastolic dysfunction, intracavitary gradient peak 2.9 m/s.   Assessment/Plan    80 yo female with DM2, HTN, HTN, carotid stenosis, orthostatic hypotension admitted with mechanical fall and hip fracture, found to be volume overloaded on admission.  1. Preoperative evaluation - seen by Dr Domenic Polite as original consult, felt to be moderate risk for ortho surgery. - from ortho notes waiting until Tues or Wed due to recent plavix.  2. Acute on chronic diastolic heart failure - patient presented with evidence of volume overload, likely related to her underlying diastolic dysfunction and severe LVH in the setting of tachycardia due to  severe pain. - net negative 1.9 liters yesterday, negative 2.8 liters since admission. Cr stable, BUN trending up. She is currently on lasix 70m IV bid - avoid overdiuresis given her LVH and basal septal hypertrophy, she had a mild intracavitary gradient on her echo. Will give her one time dose of lasix 64mIV and reevaluate tomorrow.  Continue beta blocker, avoid aggressive titration in periop period.   3. Carotid stenosis - from prior thoughts not thought to be a candidate for intervention due to comorbidities.  - of note ASA was changed to plavix after 02/2014 admission with weakness, she is not on plavix for any cardiac indication    JoCarlyle DollyM.D., F.A.C.C.

## 2014-03-26 NOTE — Progress Notes (Signed)
Inpatient Diabetes Program Recommendations  AACE/ADA: New Consensus Statement on Inpatient Glycemic Control (2013)  Target Ranges:  Prepandial:   less than 140 mg/dL      Peak postprandial:   less than 180 mg/dL (1-2 hours)      Critically ill patients:  140 - 180 mg/dL   Results for [name], [name] (MRN [id number]) as of 03/26/2014 09:19  Ref. Range 03/25/2014 08:00 03/25/2014 11:40 03/25/2014 16:42 03/25/2014 20:40  Glucose-Capillary Latest Range: 70-99 mg/dL 227 (H) 198 (H) 269 (H) 318 (H)   Diabetes history: DM2 Outpatient Diabetes medications: Lantus 3-8 units depending on CBG, Novolog 3 units with supper, Glucovance 5-500 mg BID  Current orders for Inpatient glycemic control: Lantus 10 units QHS, Novolog 0-9 units AC  Inpatient Diabetes Program Recommendations Insulin - Basal: Please consider increasing Lantus to 14 units QHS. Correction (SSI): Please consider increasing Novolog correction to moderate scale and add Novolog bedtime correction scale.  Thanks, Barnie Alderman, RN, MSN, CCRN Diabetes Coordinator Inpatient Diabetes Program [phone] (Team Pager) [phone] ([facility] office) [phone] [facility] office)

## 2014-03-27 DIAGNOSIS — F0390 Unspecified dementia without behavioral disturbance: Secondary | ICD-10-CM

## 2014-03-27 LAB — BASIC METABOLIC PANEL
BUN: 36 mg/dL — ABNORMAL HIGH (ref 6–23)
CO2: 25 mEq/L (ref 19–32)
Calcium: 9.8 mg/dL (ref 8.4–10.5)
Chloride: 94 mEq/L — ABNORMAL LOW (ref 96–112)
Creatinine, Ser: 0.71 mg/dL (ref 0.50–1.10)
GFR calc Af Amer: >90 mL/min (ref >90)
GFR calc non Af Amer: 79 mL/min — ABNORMAL LOW (ref >90)
Glucose, Bld: 297 mg/dL — ABNORMAL HIGH (ref 70–99)
Potassium: 3.5 mEq/L — ABNORMAL LOW (ref 3.7–5.3)
Sodium: 134 mEq/L — ABNORMAL LOW (ref 137–147)

## 2014-03-27 LAB — GLUCOSE, CAPILLARY
Glucose-Capillary: 271 mg/dL — ABNORMAL HIGH (ref 70–99)
Glucose-Capillary: 298 mg/dL — ABNORMAL HIGH (ref 70–99)
Glucose-Capillary: 191 mg/dL — ABNORMAL HIGH (ref 70–99)
Glucose-Capillary: 241 mg/dL — ABNORMAL HIGH (ref 70–99)

## 2014-03-27 MED ORDER — GLUCERNA SHAKE PO LIQD
237.0000 mL | Freq: Three times a day (TID) | ORAL | Status: DC
  Administered 2014-03-27 – 2014-03-30 (×5): 237 mL via ORAL

## 2014-03-27 MED ORDER — FUROSEMIDE 40 MG PO TABS
40.0000 mg | ORAL_TABLET | Freq: Every day | ORAL | Status: DC
  Administered 2014-03-29 – 2014-03-30 (×2): 40 mg via ORAL
  Filled 2014-03-27 (×2): qty 1

## 2014-03-27 MED ORDER — POTASSIUM CHLORIDE CRYS ER 20 MEQ PO TBCR
20.0000 meq | EXTENDED_RELEASE_TABLET | Freq: Two times a day (BID) | ORAL | Status: AC
  Administered 2014-03-27 (×2): 20 meq via ORAL
  Filled 2014-03-27 (×2): qty 1

## 2014-03-27 MED ORDER — FUROSEMIDE 10 MG/ML IJ SOLN
20.0000 mg | Freq: Two times a day (BID) | INTRAMUSCULAR | Status: AC
  Administered 2014-03-27 (×2): 20 mg via INTRAVENOUS
  Filled 2014-03-27 (×2): qty 2

## 2014-03-27 NOTE — Progress Notes (Signed)
Patient ID: [name], female   DOB: Jan 13, 1931, 80 y.o.   MRN: [id number]  Right hip fracture  Pulmonary edema  BMET    Component Value Date/Time   NA 141 03/26/2014 0635   K 3.7 03/26/2014 0635   CL 100 03/26/2014 0635   CO2 23 03/26/2014 0635   GLUCOSE 313* 03/26/2014 0635   BUN 41* 03/26/2014 0635   CREATININE 0.88 03/26/2014 0635   CALCIUM 9.6 03/26/2014 0635   GFRNONAA 60* 03/26/2014 0635   GFRAA 70* 03/26/2014 0635    CBC    Component Value Date/Time   WBC 6.7 03/26/2014 0635   RBC 3.72* 03/26/2014 0635   HGB 12.8 03/26/2014 0635   HCT 37.6 03/26/2014 0635   PLT 238 03/26/2014 0635   MCV 101.1* 03/26/2014 0635   MCH 34.4* 03/26/2014 0635   MCHC 34.0 03/26/2014 0635   RDW 13.6 03/26/2014 0635   LYMPHSABS 3.1 03/21/2014 1741   MONOABS 0.6 03/21/2014 1741   EOSABS 0.1 03/21/2014 1741   BASOSABS 0.0 03/21/2014 1741   preop for surgery Wednesday

## 2014-03-27 NOTE — Consult Note (Signed)
PCP:   [name], MD   Chief Complaint:    Fall  HPI: 80 year old female who   has a past medical history of Hemorrhage of gastrointestinal tract, unspecified; Other malaise and fatigue; Tobacco use disorder; Parotid tumor; Neoplasm of malignant; Diabetes mellitus; Osteopenia; Hypertension; Hyperlipidemia; Depression; Anxiety state, unspecified; Chronic headaches; Carotid artery disease; and Orthostatic hypotension. Was brought to the ED after patient fell in the bathroom. As per patient she went to the toilet and was about to clean herself when she lost her balance and fell. She denies loss of consciousness, no headache no chest pain. Patient says that after falling she had sudden onset of pain in the right hip. She also has some shortness of breath, patient was brought to the ED . In the ED patient was found to be mildly hypoxic, with O2 sats 88% on room air . Chest x-ray showed pulmonary edema and vascular congestion , BNP elevated to 556. Last 2-D echocardiogram showed grade 1 diastolic dysfunction.    also patient found to have displaced right femoral neck fracture without dislocation. Orthopedics been consulted by ED physician. She received a dose of Lasix 40 mg IV x1, and has diuresed about 800 cc of urine. Patient also has been diagnosed with UTI recently, and has been taking Macrobid twice a day, despite taking Macrobid patient continued to have symptoms of dysuria and intermittent fevers. She has elevated white count of 15,000.       Prior to Admission medications    Medication  Sig  Start Date  End Date  Taking?  Authorizing Provider   ALPRAZolam (XANAX) 0.25 MG tablet  Take 0.25 mg by mouth 2 (two) times daily.      Yes  Historical Provider, MD   atorvastatin (LIPITOR) 10 MG tablet  Take 1 tablet (10 mg total) by mouth daily.  02/20/14    Yes  [name], MD   cholecalciferol (VITAMIN D) 1000 UNITS tablet  Take 1,000 Units by mouth every morning.       Yes  Historical Provider,  MD   clopidogrel (PLAVIX) 75 MG tablet  Take 1 tablet (75 mg total) by mouth daily with breakfast.  02/20/14    Yes  [name], MD   docusate sodium (COLACE) 100 MG capsule  Take 100 mg by mouth at bedtime.       Yes  Historical Provider, MD   esomeprazole (NEXIUM) 40 MG capsule  Take 40 mg by mouth at bedtime.       Yes  Historical Provider, MD   glyBURIDE-metformin (GLUCOVANCE) 5-500 MG per tablet  Take 2 tablets by mouth 2 (two) times daily with a meal.       Yes  Historical Provider, MD   insulin aspart (NOVOLOG) 100 UNIT/ML injection  Inject 3 Units into the skin daily after supper.       Yes  Historical Provider, MD   insulin glargine (LANTUS) 100 UNIT/ML injection  Inject 3-8 Units into the skin at bedtime. Give if BS 140-150= 3 units, 151-160=4 units, 161-170=5 units, 171-180= 6 units, >181=7units      Yes  Historical Provider, MD   lisinopril (PRINIVIL,ZESTRIL) 20 MG tablet  Take 1 tablet (20 mg total) by mouth daily.  06/27/13    Yes  [name], MD   Methylfol-Methylcob-Acetylcyst (CEREFOLIN NAC PO)  Take 1 tablet by mouth daily.      Yes  Historical Provider, MD   metoprolol succinate (TOPROL-XL) 50 MG 24 hr tablet  Take 1 tablet (50 mg total) by mouth daily. Take with or immediately following a meal.  06/27/13    Yes  [name], MD   mirtazapine (REMERON) 30 MG tablet  Take 30 mg by mouth at bedtime.      Yes  Historical Provider, MD   nitrofurantoin, macrocrystal-monohydrate, (MACROBID) 100 MG capsule  Take 100 mg by mouth 2 (two) times daily. For 7 days(started 03/16/14)      Yes  Historical Provider, MD   spironolactone (ALDACTONE) 25 MG tablet  Take 25 mg by mouth daily.      Yes  Historical Provider, MD   tiotropium (SPIRIVA) 18 MCG inhalation capsule  Place 18 mcg into inhaler and inhale daily as needed (shortness of breath).      Yes  Historical Provider, MD     Social History:  reports that she quit smoking about a year ago. Her smoking use included Cigarettes. She has a  20 pack-year smoking history. She does not have any smokeless tobacco history on file. She reports that she does not drink alcohol or use illicit drugs.    Family History   Problem  Relation  Age of Onset   .  Heart failure  Mother         CHF   .  Cirrhosis  Brother     .  Lung cancer  Father         brain mets   .  Kidney cancer  Mother     .  Kidney cancer  Sister        All the positives are listed in BOLD  Review of Systems:  HEENT: Headache, blurred vision, runny nose, sore throat Neck: Hypothyroidism, hyperthyroidism,,lymphadenopathy Chest : Shortness of breath, history of COPD, Asthma Heart : Chest pain, history of coronary arterey disease GI:  Nausea, vomiting, diarrhea, constipation, GERD GU: Dysuria, urgency, frequency of urination, hematuria Neuro: Stroke, seizures, syncope Psych: Depression, anxiety, hallucinations   Physical Exam: Blood pressure 161/84, pulse 99, temperature 99.2 F (37.3 C), temperature source Oral, resp. rate 22, height 5' 4" (1.626 m), SpO2 93.00%.   Past Medical History  Diagnosis Date  . History of GI bleed   . Parotid tumor   . History of breast cancer   . Type 2 diabetes mellitus   . Osteopenia   . Essential hypertension, benign   . Hyperlipidemia   . Depression   . Anxiety   . Chronic headaches   . Carotid artery disease     Bilateral ICA occlusion  . Orthostatic hypotension     Diabetic polyneuropathy/diabetic dysautonomia   . Coronary atherosclerosis of native coronary artery     Mild nonobstructive disease at cardiac catheterization May 2011  . PSVT (paroxysmal supraventricular tachycardia)     Past Surgical History  Procedure Laterality Date  . Mastectomy  1994    Left breast -related to breast cancer  . Vesicovaginal fistula closure w/ tah  1979  . Cholecystectomy  2008  . Hemorrhoid surgery  1960s  . Laparoscopic appendectomy N/A 04/13/2013    Procedure: APPENDECTOMY LAPAROSCOPIC;  Surgeon: Donato Heinz, MD;   Location: [facility];  Service: General;  Laterality: N/A;  . Colonoscopy  2010    Dr. Oneida Alar: single tubular adenoma, one hyperplastic polyp. Next TCS 2020 health permitting.    Family History  Problem Relation Age of Onset  . Heart failure Mother   . Cirrhosis Brother   . Lung cancer Father  Brain METS  . Kidney cancer Mother   . Kidney cancer Sister     Social History:  reports that she quit smoking about a year ago. Her smoking use included Cigarettes. She has a 20 pack-year smoking history. She does not have any smokeless tobacco history on file. She reports that she does not drink alcohol or use illicit drugs.  Allergies:  Allergies  Allergen Reactions  . Aricept [Donepezil Hcl]     Hives, vomiting and headache  . Namenda [Memantine Hcl]     hives  . Codeine   . Latex   . Penicillins Rash    Medications: I have reviewed the patient's current medications.  Results for orders placed during the hospital encounter of 03/21/14 (from the past 48 hour(s))  GLUCOSE, CAPILLARY     Status: Abnormal   Collection Time    03/25/14 11:40 AM      Result Value Ref Range   Glucose-Capillary 198 (*) 70 - 99 mg/dL   Comment 1 Documented in Chart     Comment 2 Notify RN    URINALYSIS, ROUTINE W REFLEX MICROSCOPIC     Status: Abnormal   Collection Time    03/25/14  2:00 PM      Result Value Ref Range   Color, Urine YELLOW  YELLOW   APPearance HAZY (*) CLEAR   Specific Gravity, Urine 1.025  1.005 - 1.030   pH 5.5  5.0 - 8.0   Glucose, UA NEGATIVE  NEGATIVE mg/dL   Hgb urine dipstick TRACE (*) NEGATIVE   Bilirubin Urine SMALL (*) NEGATIVE   Ketones, ur NEGATIVE  NEGATIVE mg/dL   Protein, ur NEGATIVE  NEGATIVE mg/dL   Urobilinogen, UA 0.2  0.0 - 1.0 mg/dL   Nitrite NEGATIVE  NEGATIVE   Leukocytes, UA TRACE (*) NEGATIVE  URINE MICROSCOPIC-ADD ON     Status: None   Collection Time    03/25/14  2:00 PM      Result Value Ref Range   Squamous Epithelial / LPF RARE  RARE   WBC, UA  0-2  <3 WBC/hpf   RBC / HPF 0-2  <3 RBC/hpf  GLUCOSE, CAPILLARY     Status: Abnormal   Collection Time    03/25/14  4:42 PM      Result Value Ref Range   Glucose-Capillary 269 (*) 70 - 99 mg/dL   Comment 1 Documented in Chart     Comment 2 Notify RN    GLUCOSE, CAPILLARY     Status: Abnormal   Collection Time    03/25/14  8:40 PM      Result Value Ref Range   Glucose-Capillary 318 (*) 70 - 99 mg/dL   Comment 1 Documented in Chart     Comment 2 Notify RN    BASIC METABOLIC PANEL     Status: Abnormal   Collection Time    03/26/14  6:35 AM      Result Value Ref Range   Sodium 141  137 - 147 mEq/L   Potassium 3.7  3.7 - 5.3 mEq/L   Chloride 100  96 - 112 mEq/L   CO2 23  19 - 32 mEq/L   Glucose, Bld 313 (*) 70 - 99 mg/dL   BUN 41 (*) 6 - 23 mg/dL   Creatinine, Ser 0.88  0.50 - 1.10 mg/dL   Calcium 9.6  8.4 - 10.5 mg/dL   GFR calc non Af Amer 60 (*) >90 mL/min   GFR calc Af Amer 70 (*) >  90 mL/min   Comment: (NOTE)     The eGFR has been calculated using the CKD EPI equation.     This calculation has not been validated in all clinical situations.     eGFR's persistently <90 mL/min signify possible Chronic Kidney     Disease.  CBC     Status: Abnormal   Collection Time    03/26/14  6:35 AM      Result Value Ref Range   WBC 6.7  4.0 - 10.5 K/uL   RBC 3.72 (*) 3.87 - 5.11 MIL/uL   Hemoglobin 12.8  12.0 - 15.0 g/dL   HCT 37.6  36.0 - 46.0 %   MCV 101.1 (*) 78.0 - 100.0 fL   MCH 34.4 (*) 26.0 - 34.0 pg   MCHC 34.0  30.0 - 36.0 g/dL   RDW 13.6  11.5 - 15.5 %   Platelets 238  150 - 400 K/uL  GLUCOSE, CAPILLARY     Status: Abnormal   Collection Time    03/26/14  7:43 AM      Result Value Ref Range   Glucose-Capillary 276 (*) 70 - 99 mg/dL   Comment 1 Notify RN    GLUCOSE, CAPILLARY     Status: Abnormal   Collection Time    03/26/14 11:34 AM      Result Value Ref Range   Glucose-Capillary 292 (*) 70 - 99 mg/dL   Comment 1 Notify RN    GLUCOSE, CAPILLARY     Status: Abnormal    Collection Time    03/26/14  5:03 PM      Result Value Ref Range   Glucose-Capillary 257 (*) 70 - 99 mg/dL   Comment 1 Notify RN    GLUCOSE, CAPILLARY     Status: Abnormal   Collection Time    03/26/14  8:33 PM      Result Value Ref Range   Glucose-Capillary 313 (*) 70 - 99 mg/dL   Comment 1 Notify RN    GLUCOSE, CAPILLARY     Status: Abnormal   Collection Time    03/27/14  7:42 AM      Result Value Ref Range   Glucose-Capillary 271 (*) 70 - 99 mg/dL   Comment 1 Notify RN      Dg Shoulder Left Port  03/25/2014   CLINICAL DATA:  Recent injury with pain  EXAM: PORTABLE LEFT SHOULDER - 2+ VIEW  COMPARISON:  None.  FINDINGS: There is no evidence of fracture or dislocation. There is no evidence of arthropathy or other focal bone abnormality. Soft tissues are unremarkable.  IMPRESSION: No acute abnormality noted.   Electronically Signed   By: Inez Catalina M.D.   On: 03/25/2014 20:27    Review of Systems  Respiratory: Negative.    Blood pressure 141/65, pulse 95, temperature 98 F (36.7 C), temperature source Oral, resp. rate 16, height 5' 4" (1.626 m), weight 153 lb 3.5 oz (69.5 kg), SpO2 96.00%. Physical Exam  General the patient is well-developed and well-nourished grooming and hygiene are normal Oriented x3 Mood and affect normal Ambulation abnormal cannot walk secondary to hip fracture  Inspection of the right hip Decreased range of motion external rotation deformity normal muscle tone normal skin  Right leg normal cardiovascular and sensory findings  Left Lower extremity exam Inspection and palpation revealed no tenderness or abnormality in alignment in the lower extremities. Range of motion is full.  Strength is grade 5.  All joints are stable. Upper extremity  exam  The right and left upper extremity:   Inspection and palpation revealed no abnormalities in the upper extremities.   Range of motion is full without contracture.  Motor exam is normal with grade 5  strength.  The joints are fully reduced without subluxation.  There is no atrophy or tremor and muscle tone is normal.  All joints are stable.  Assessment/Plan: Displaced right femoral neck fracture, patient has waited 5 days secondary to Plavix and also has been treated for pulmonary edema  Plan is for right bipolar hip replacement  Carole Civil 03/27/2014, 10:15 AM

## 2014-03-27 NOTE — Clinical Social Work Psychosocial (Signed)
Clinical Social Work Department BRIEF PSYCHOSOCIAL ASSESSMENT 03/27/2014  Patient:  EVLYN, AMASON     Account Number:  [id number]     Admit date:  03/21/2014  Clinical Social Worker:  Daiva Huge  Date/Time:  03/27/2014 01:21 PM  Referred by:  Physician  Date Referred:  03/27/2014 Referred for  SNF Placement   Other Referral:   Interview type:  Other - See comment Other interview type:   Spoke with patient's daughter Ivin Booty at bedside while patient was sleeping    PSYCHOSOCIAL DATA Living Status:  FAMILY Admitted from facility:   Level of care:   Primary support name:  3 DAUGHTERS Primary support relationship to patient:  FAMILY Degree of support available:   GOOD    CURRENT CONCERNS Current Concerns  Post-Acute Placement   Other Concerns:    SOCIAL WORK ASSESSMENT / PLAN Met with patients daughter at bedside and discussed probable need for SNF- they are open to this- patient has been living with one of her daughters here in [place]- she has 2 other daughters- one in [place] and one in [place]-   Assessment/plan status:  Other - See comment Other assessment/ plan:   FL2 and PASARR   Information/referral to community resources:   SNF list    PATIENT'S/FAMILY'S RESPONSE TO PLAN OF CARE: Surgery is planned for tomorrow- will proceed with SNF search and advise-   Eduard Clos, MSW, [place]

## 2014-03-27 NOTE — Progress Notes (Signed)
Inpatient Diabetes Program Recommendations  AACE/ADA: New Consensus Statement on Inpatient Glycemic Control (2013)  Target Ranges:  Prepandial:   less than 140 mg/dL      Peak postprandial:   less than 180 mg/dL (1-2 hours)      Critically ill patients:  140 - 180 mg/dL   Results for [name], [name] (MRN [id number]) as of 03/27/2014 11:33  Ref. Range 03/26/2014 07:43 03/26/2014 11:34 03/26/2014 17:03 03/26/2014 20:33 03/27/2014 07:42  Glucose-Capillary Latest Range: 70-99 mg/dL 276 (H) 292 (H) 257 (H) 313 (H) 271 (H)  Diabetes history: DM2  Outpatient Diabetes medications: Lantus 3-8 units depending on CBG, Novolog 3 units with supper, Glucovance 5-500 mg BID  Current orders for Inpatient glycemic control: Lantus 14 units QHS, Novolog 0-15 units AC, Novolog 0-5 units HS  Inpatient Diabetes Program Recommendations Insulin - Basal: Please consider increasing Lantus to 16 units QHS. Insulin - Meal Coverage: Please consider ordering Novolog 4 units TID with meals if patient is eating at least 50% of meals.  Thanks, Barnie Alderman, RN, MSN, CCRN Diabetes Coordinator Inpatient Diabetes Program [phone] (Team Pager) [phone] ([facility] office) [phone] [facility] office)

## 2014-03-27 NOTE — Progress Notes (Signed)
INITIAL NUTRITION ASSESSMENT  DOCUMENTATION CODES Per approved criteria  -Not Applicable   INTERVENTION: Glucerna Shake po TID, each supplement provides 220 kcal and 10 grams of protein  NUTRITION DIAGNOSIS: Inadequate oral intake related to decreased appetite as evidenced by PO: 10-25%.   Goal: Pt will meet >90% of estimated nutritional needs  Monitor:  PO intake, labs, skin assessments, weight changes, I/O's  Reason for Assessment: LOS, poor po's  80 y.o. female  Admitting Dx: Hip fracture, right  ASSESSMENT: Pt admitted from home with rt femoral neck fx. Surgery has been put on hold and is scheduled for tomorrow, due to pt's last dose of Plavix on 03/24/14.  Intake has been very poor since admission. Noted documented PO intake of 10-25%. Wt hx reveals wt gain trend over the past year.   Height: Ht Readings from Last 1 Encounters:  03/21/14 _0  (1.626 m)    Weight: Wt Readings from Last 1 Encounters:  03/21/14 153 lb 3.5 oz (69.5 kg)    Ideal Body Weight: 120#  % Ideal Body Weight: 128%  Wt Readings from Last 10 Encounters:  03/21/14 153 lb 3.5 oz (69.5 kg)  02/17/14 155 lb 13.8 oz (70.7 kg)  12/01/13 140 lb (63.504 kg)  08/17/13 146 lb 6.4 oz (66.407 kg)  06/27/13 136 lb 14.5 oz (62.1 kg)  06/12/13 136 lb 11 oz (62 kg)  05/30/13 138 lb 0.1 oz (62.6 kg)  05/08/13 148 lb (67.132 kg)  04/20/13 156 lb 8.4 oz (71 kg)  04/13/13 148 lb (67.132 kg)    Usual Body Weight: 148#  % Usual Body Weight: 103%  BMI:  Body mass index is 26.29 kg/(m^2). Meets criteria for overweight.   Estimated Nutritional Needs: Kcal: 1500-1700 daily Protein: 71-85 grams daily Fluid: 1.5-1.7 L daily  Skin: WDL  Diet Order: Cardiac  EDUCATION NEEDS: -Education not appropriate at this time   Intake/Output Summary (Last 24 hours) at 03/27/14 1714 Last data filed at 03/27/14 0606  Gross per 24 hour  Intake    240 ml  Output    700 ml  Net   -460 ml    Last BM: 03/22/14    Labs:   Recent Labs Lab 03/25/14 0605 03/26/14 0635 03/27/14 0927  NA 144 141 134*  K 3.4* 3.7 3.5*  CL 103 100 94*  CO2 _1 BUN 34* 41* 36*  CREATININE 0.78 0.88 0.71  CALCIUM 9.8 9.6 9.8  GLUCOSE 249* 313* 297*    CBG (last 3)   Recent Labs  03/27/14 0742 03/27/14 1159 03/27/14 1635  GLUCAP 271* 298* 191*    Scheduled Meds: . ALPRAZolam  0.25 mg Oral BID  . atorvastatin  10 mg Oral q1800  . docusate sodium  100 mg Oral QHS  . furosemide  20 mg Intravenous BID  . furosemide  40 mg Oral Daily  . guaiFENesin  1,200 mg Oral BID  . insulin aspart  0-15 Units Subcutaneous TID WC  . insulin aspart  0-5 Units Subcutaneous QHS  . insulin glargine  14 Units Subcutaneous QHS  . lisinopril  20 mg Oral Daily  . metoprolol succinate  50 mg Oral Daily  . mirtazapine  30 mg Oral QHS  . polyethylene glycol  17 g Oral Daily  . potassium chloride  20 mEq Oral BID  . spironolactone  25 mg Oral Daily    Continuous Infusions:   Past Medical History  Diagnosis Date  . History of GI bleed   .  Parotid tumor   . History of breast cancer   . Type 2 diabetes mellitus   . Osteopenia   . Essential hypertension, benign   . Hyperlipidemia   . Depression   . Anxiety   . Chronic headaches   . Carotid artery disease     Bilateral ICA occlusion  . Orthostatic hypotension     Diabetic polyneuropathy/diabetic dysautonomia   . Coronary atherosclerosis of native coronary artery     Mild nonobstructive disease at cardiac catheterization May 2011  . PSVT (paroxysmal supraventricular tachycardia)     Past Surgical History  Procedure Laterality Date  . Mastectomy  1994    Left breast -related to breast cancer  . Vesicovaginal fistula closure w/ tah  1979  . Cholecystectomy  2008  . Hemorrhoid surgery  1960s  . Laparoscopic appendectomy N/A 04/13/2013    Procedure: APPENDECTOMY LAPAROSCOPIC;  Surgeon: Donato Heinz, MD;  Location: [facility];  Service: General;  Laterality:  N/A;  . Colonoscopy  2010    Dr. Oneida Alar: single tubular adenoma, one hyperplastic polyp. Next TCS 2020 health permitting.    Laynie Espy A. Jimmye Norman, RD, LDN Pager: [phone]

## 2014-03-27 NOTE — Progress Notes (Signed)
Patient ID: [name], female   DOB: December 28, 1930, 80 y.o.   MRN: [id number]     Subjective:    No complaints this AM  Objective:   Temp:  [98 F (36.7 C)-98.5 F (36.9 C)] 98 F (36.7 C) (05/12 0604) Pulse Rate:  [94-100] 95 (05/12 0604) Resp:  [16-20] 16 (05/12 0755) BP: (132-146)/(60-78) 141/65 mmHg (05/12 0604) SpO2:  [92 %-97 %] 96 % (05/12 0755) Last BM Date: 03/22/14  Filed Weights   03/21/14 2120  Weight: 153 lb 3.5 oz (69.5 kg)    Intake/Output Summary (Last 24 hours) at 03/27/14 1026 Last data filed at 03/27/14 0606  Gross per 24 hour  Intake    240 ml  Output    700 ml  Net   -460 ml    Telemetry: NSR and sinus tach  Exam:  General: NAD  Resp: Clear anteriorally  Cardiac: RRR, 3/6 systolic murmur at apex  GI: abdomen soft, NT, ND  MSK: no LE edema  Neuro: no focal deficits  Psych: appropriate affect  Lab Results:  Basic Metabolic Panel:  Recent Labs Lab 03/24/14 0636 03/25/14 0605 03/26/14 0635  NA 146 144 141  K 3.7 3.4* 3.7  CL 105 103 100  CO2 _0 GLUCOSE 211* 249* 313*  BUN 27* 34* 41*  CREATININE 0.80 0.78 0.88  CALCIUM 10.1 9.8 9.6    Liver Function Tests:  Recent Labs Lab 03/21/14 1741  AST 28  ALT 18  ALKPHOS 152*  BILITOT 0.9  PROT 7.8  ALBUMIN 3.9    CBC:  Recent Labs Lab 03/23/14 0604 03/25/14 0605 03/26/14 0635  WBC 9.6 7.2 6.7  HGB 13.9 12.6 12.8  HCT 41.3 37.0 37.6  MCV 99.3 100.5* 101.1*  PLT 243 235 238    Cardiac Enzymes:  Recent Labs Lab 03/21/14 1838  TROPONINI <0.30    BNP:  Recent Labs  06/24/13 0513 02/18/14 0724 03/21/14 1741  PROBNP 768.3* 285.7 556.1*    Coagulation: No results found for this basename: INR,  in the last 168 hours  ECG:   Medications:   Scheduled Medications: . ALPRAZolam  0.25 mg Oral BID  . atorvastatin  10 mg Oral q1800  . docusate sodium  100 mg Oral QHS  . furosemide  20 mg Intravenous BID  . guaiFENesin  1,200 mg Oral BID  .  insulin aspart  0-15 Units Subcutaneous TID WC  . insulin aspart  0-5 Units Subcutaneous QHS  . insulin glargine  14 Units Subcutaneous QHS  . lisinopril  20 mg Oral Daily  . metoprolol succinate  50 mg Oral Daily  . mirtazapine  30 mg Oral QHS  . polyethylene glycol  17 g Oral Daily  . potassium chloride  20 mEq Oral BID  . spironolactone  25 mg Oral Daily     Infusions:     PRN Medications:  HYDROcodone-acetaminophen, methocarbamol (ROBAXIN) IV, methocarbamol, morphine injection, polyvinyl alcohol     Assessment/Plan    80 yo female with DM2, HTN, HTN, carotid stenosis, orthostatic hypotension admitted with mechanical fall and hip fracture, found to be volume overloaded on admission.   1. Preoperative evaluation  - seen by Dr Domenic Polite as original consult, felt to be moderate risk for ortho surgery.  - from ortho notes waiting until Tues or Wed due to recent plavix.   2. Acute on chronic diastolic heart failure  - 02/2014 echo LVEF 20-25%, grade I diastolic dysfunction. - patient presented with evidence  of volume overload, likely related to her underlying diastolic dysfunction and severe LVH in the setting of tachycardia due to severe pain.  - net negative 460 mL yesterday, negative 3.2 liters since admission. Cr stable, BUN trending up. She is currently on lasix 88m IV daily - avoid overdiuresis given her LVH and basal septal hypertrophy, she had a mild intracavitary gradient on her echo.  - appears near euvolemic, will start oral lasix today  3. Carotid stenosis  - from prior thoughts not thought to be a candidate for intervention due to comorbidities.  - of note ASA was changed to plavix after 02/2014 admission with weakness, she is not on plavix for any cardiac indication    No active cardiac conditions, diuretics as described above. Will signoff of inpatient care, call with questions. She may follow up with our NP Lawrence in 2-3 weeks. Call with questions.      JCarlyle Dolly M.D., F.A.C.C.

## 2014-03-27 NOTE — Progress Notes (Signed)
[facility] PROGRESS NOTE  [name] ZVG:[id number] DOB: November 07, 1931 DOA: 03/21/2014 PCP: Michele Gravel, MD  Summary:  This patient was brought to the hospital after she had fallen in her bathroom. She had lost her balance and fell. Upon arrival to the emergency room, She was found to have a right hip fracture. She was also noted to be short of breath and hypoxic. Chest x-ray showed pulmonary edema. The patient was admitted to the hospital for further treatment. She was seen by cardiology for congestive heart failure and underwent echocardiogram. She was started on intravenous Lasix and now has approached euvolemic. Patient was taking Plavix for secondary stroke prophylaxis. Unfortunately, since she had been taking Plavix, her surgery had to be delayed by these 5 days. Plan is for operative management of her right hip fracture tomorrow. She will likely need skilled nursing facility placement thereafter.  Assessment/Plan: 1. Right hip fracture. Orthopedics following. Patient had taken Plavix prior to admission and therefore surgery was delayed. Plans are for operative repair tomorrow. 2. Acute diastolic congestive heart failure. Appears to have approached euvolemic. Lasix has been changed to by mouth. cardiology following. 3. Acute respiratory failure due to #2. Wean off oxygen as tolerated. Repeat chest xray shows worsening right lower lobe process, concerning for atelectasis, pneumonia or worsening edema.  Since patient does not have fever or leukocytosis, will hold off on antibiotics. Encouraged incentive spirometry.  Patient takes very shallow breaths and it is likely that atelectasis is playing a role. 4. History of coronary disease. Plavix on hold secondary to #1 and need for surgery. Will resume when able. 5. Mild metabolic acidosis. Lactic acid was elevated. This has resolved. 6. Diabetes. On Lantus and sliding scale insulin. 7. Altered mental status. Family reports that patient is more  confused and lethargic. She does have a history of coronary disease. She does not appear to have any ongoing infectious process. This is likely related to pain medication as well as her underlying dementia.  Code Status: DNR Family Communication: discussed with son at the bedside Disposition Plan: pending hospital course, suspect SNF placement   Consultants:  Cardiology  Orthopedics  Procedures:    Antibiotics:  levaquin 5/6>>5/7  HPI/Subjective: No new complaints   Objective: Filed Vitals:   03/27/14 1535  BP:   Pulse:   Temp:   Resp: 18    Intake/Output Summary (Last 24 hours) at 03/27/14 2058 Last data filed at 03/27/14 1700  Gross per 24 hour  Intake   1570 ml  Output    900 ml  Net    670 ml   Filed Weights   03/21/14 2120  Weight: 69.5 kg (153 lb 3.5 oz)    Exam:   General:  NAD  Cardiovascular: S1, S2 RRR  Respiratory: CTA B  Abdomen: soft, nt, nd, bs+  Musculoskeletal: No edema in LE bilaterally   Data Reviewed: Basic Metabolic Panel:  Recent Labs Lab 03/23/14 0604 03/24/14 0636 03/25/14 0605 03/26/14 0635 03/27/14 0927  NA 147 146 144 141 134*  K 3.7 3.7 3.4* 3.7 3.5*  CL 105 105 103 100 94*  CO2 _0 GLUCOSE 224* 211* 249* 313* 297*  BUN 23 27* 34* 41* 36*  CREATININE 0.79 0.80 0.78 0.88 0.71  CALCIUM 9.9 10.1 9.8 9.6 9.8   Liver Function Tests:  Recent Labs Lab 03/21/14 1741  AST 28  ALT 18  ALKPHOS 152*  BILITOT 0.9  PROT 7.8  ALBUMIN 3.9  Recent Labs Lab 03/21/14 1741  LIPASE 23   No results found for this basename: AMMONIA,  in the last 168 hours CBC:  Recent Labs Lab 03/21/14 1741 03/22/14 0500 03/23/14 0604 03/25/14 0605 03/26/14 0635  WBC 15.2* 10.5 9.6 7.2 6.7  NEUTROABS 11.4*  --   --   --   --   HGB 14.3 14.1 13.9 12.6 12.8  HCT 40.5 39.9 41.3 37.0 37.6  MCV 98.3 96.8 99.3 100.5* 101.1*  PLT 330 300 243 235 238   Cardiac Enzymes:  Recent Labs Lab 03/21/14 1838   TROPONINI <0.30   BNP (last 3 results)  Recent Labs  06/24/13 0513 02/18/14 0724 03/21/14 1741  PROBNP 768.3* 285.7 556.1*   CBG:  Recent Labs Lab 03/26/14 1703 03/26/14 2033 03/27/14 0742 03/27/14 1159 03/27/14 1635  GLUCAP 257* 313* 271* 298* 191*    Recent Results (from the past 240 hour(s))  URINE CULTURE     Status: None   Collection Time    03/21/14  5:35 PM      Result Value Ref Range Status   Specimen Description URINE, CATHETERIZED   Final   Special Requests NONE   Final   Culture  Setup Time     Final   Value: 03/21/2014 22:00     Performed at [facility] Count     Final   Value: NO GROWTH     Performed at [facility]   Culture     Final   Value: NO GROWTH     Performed at [facility]   Report Status 03/23/2014 FINAL   Final     Studies: No results found.  Scheduled Meds: . ALPRAZolam  0.25 mg Oral BID  . atorvastatin  10 mg Oral q1800  . docusate sodium  100 mg Oral QHS  . feeding supplement (GLUCERNA SHAKE)  237 mL Oral TID BM  . furosemide  40 mg Oral Daily  . guaiFENesin  1,200 mg Oral BID  . insulin aspart  0-15 Units Subcutaneous TID WC  . insulin aspart  0-5 Units Subcutaneous QHS  . insulin glargine  14 Units Subcutaneous QHS  . lisinopril  20 mg Oral Daily  . metoprolol succinate  50 mg Oral Daily  . mirtazapine  30 mg Oral QHS  . polyethylene glycol  17 g Oral Daily  . potassium chloride  20 mEq Oral BID  . spironolactone  25 mg Oral Daily   Continuous Infusions:   Principal Problem:   Hip fracture, right Active Problems:   DIABETES MELLITUS, TYPE II, UNCONTROLLED   Fall   UTI (lower urinary tract infection)   Hip fracture   Acute diastolic congestive heart failure   Preoperative cardiovascular examination    Time spent: 22mns    [name]  [facility] Pager [phone] If 7PM-7AM, please contact night-coverage at [email or web address], password TF[facility]5/10/2014, 8:58 PM  LOS:  6 days

## 2014-03-28 ENCOUNTER — Encounter (HOSPITAL_COMMUNITY)
Admission: EM | Disposition: A | Discharge status: Skilled Nursing Facility | Payer: Self-pay | Source: Home / Self Care | Attending: Internal Medicine

## 2014-03-28 ENCOUNTER — Inpatient Hospital Stay (HOSPITAL_COMMUNITY): Payer: Medicare Other

## 2014-03-28 ENCOUNTER — Inpatient Hospital Stay (HOSPITAL_COMMUNITY): Payer: Medicare Other | Admitting: Anesthesiology

## 2014-03-28 ENCOUNTER — Encounter (HOSPITAL_COMMUNITY): Payer: Medicare Other | Admitting: Anesthesiology

## 2014-03-28 ENCOUNTER — Other Ambulatory Visit: Payer: Medicare Other

## 2014-03-28 ENCOUNTER — Encounter (HOSPITAL_COMMUNITY): Payer: Self-pay

## 2014-03-28 DIAGNOSIS — S72001A Fracture of unspecified part of neck of right femur, initial encounter for closed fracture: Secondary | ICD-10-CM | POA: Diagnosis present

## 2014-03-28 HISTORY — PX: HIP ARTHROPLASTY: SHX981

## 2014-03-28 HISTORY — DX: Fracture of unspecified part of neck of right femur, initial encounter for closed fracture: S72.001A

## 2014-03-28 LAB — GLUCOSE, CAPILLARY
Glucose-Capillary: 227 mg/dL — ABNORMAL HIGH (ref 70–99)
Glucose-Capillary: 213 mg/dL — ABNORMAL HIGH (ref 70–99)
Glucose-Capillary: 143 mg/dL — ABNORMAL HIGH (ref 70–99)
Glucose-Capillary: 155 mg/dL — ABNORMAL HIGH (ref 70–99)

## 2014-03-28 LAB — PREPARE RBC (CROSSMATCH)

## 2014-03-28 LAB — SURGICAL PCR SCREEN
MRSA, PCR: NEGATIVE
Staphylococcus aureus: NEGATIVE

## 2014-03-28 LAB — BASIC METABOLIC PANEL
BUN: 37 mg/dL — ABNORMAL HIGH (ref 6–23)
CO2: 24 mEq/L (ref 19–32)
Calcium: 10 mg/dL (ref 8.4–10.5)
Chloride: 100 mEq/L (ref 96–112)
Creatinine, Ser: 0.73 mg/dL (ref 0.50–1.10)
GFR calc Af Amer: >90 mL/min (ref >90)
GFR calc non Af Amer: 78 mL/min — ABNORMAL LOW (ref >90)
Glucose, Bld: 246 mg/dL — ABNORMAL HIGH (ref 70–99)
Potassium: 4.2 mEq/L (ref 3.7–5.3)
Sodium: 137 mEq/L (ref 137–147)

## 2014-03-28 LAB — PROTIME-INR
INR: 1.26 (ref 0.00–1.49)
Prothrombin Time: 15.5 seconds — ABNORMAL HIGH (ref 11.6–15.2)

## 2014-03-28 LAB — APTT: aPTT: 27 seconds (ref 24–37)

## 2014-03-28 LAB — ABO/RH: ABO/RH(D): O POS

## 2014-03-28 SURGERY — HEMIARTHROPLASTY, HIP, DIRECT ANTERIOR APPROACH, FOR FRACTURE
Anesthesia: Monitor Anesthesia Care | Site: Hip | Laterality: Right

## 2014-03-28 MED ORDER — MORPHINE SULFATE 2 MG/ML IJ SOLN
1.0000 mg | INTRAMUSCULAR | Status: DC | PRN
  Administered 2014-03-29: 1 mg via INTRAVENOUS
  Filled 2014-03-28: qty 1

## 2014-03-28 MED ORDER — METOCLOPRAMIDE HCL 5 MG/ML IJ SOLN: 5.0000 mg | Freq: Three times a day (TID) | INTRAMUSCULAR | Status: DC | PRN

## 2014-03-28 MED ORDER — EPHEDRINE SULFATE 50 MG/ML IJ SOLN
INTRAMUSCULAR | Status: AC
  Filled 2014-03-28: qty 1

## 2014-03-28 MED ORDER — BUPIVACAINE-EPINEPHRINE (PF) 0.5% -1:200000 IJ SOLN
INTRAMUSCULAR | Status: DC | PRN
  Administered 2014-03-28: 60 mL

## 2014-03-28 MED ORDER — ENOXAPARIN SODIUM 30 MG/0.3ML ~~LOC~~ SOLN
30.0000 mg | SUBCUTANEOUS | Status: DC
  Administered 2014-03-29 – 2014-03-30 (×2): 30 mg via SUBCUTANEOUS
  Filled 2014-03-28 (×2): qty 0.3

## 2014-03-28 MED ORDER — MIDAZOLAM HCL 2 MG/2ML IJ SOLN
INTRAMUSCULAR | Status: AC
  Filled 2014-03-28: qty 2

## 2014-03-28 MED ORDER — BUPIVACAINE IN DEXTROSE 0.75-8.25 % IT SOLN
INTRATHECAL | Status: DC | PRN
  Administered 2014-03-28: 15 mg via INTRATHECAL

## 2014-03-28 MED ORDER — MIDAZOLAM HCL 2 MG/2ML IJ SOLN
1.0000 mg | INTRAMUSCULAR | Status: DC | PRN
  Administered 2014-03-28: 2 mg via INTRAVENOUS

## 2014-03-28 MED ORDER — FENTANYL CITRATE 0.05 MG/ML IJ SOLN
INTRAMUSCULAR | Status: DC | PRN
  Administered 2014-03-28 (×2): 25 ug via INTRAVENOUS
  Administered 2014-03-28: 30 ug via INTRAVENOUS
  Administered 2014-03-28: 20 ug via INTRATHECAL

## 2014-03-28 MED ORDER — SODIUM CHLORIDE 0.9 % IR SOLN
Status: DC | PRN
  Administered 2014-03-28 (×2): 1000 mL

## 2014-03-28 MED ORDER — CHLORHEXIDINE GLUCONATE 4 % EX LIQD
60.0000 mL | Freq: Once | CUTANEOUS | Status: DC
  Filled 2014-03-28: qty 15

## 2014-03-28 MED ORDER — ACETAMINOPHEN 325 MG PO TABS: 650.0000 mg | ORAL_TABLET | Freq: Four times a day (QID) | ORAL | Status: DC | PRN

## 2014-03-28 MED ORDER — MENTHOL 3 MG MT LOZG: 1.0000 | LOZENGE | OROMUCOSAL | Status: DC | PRN

## 2014-03-28 MED ORDER — FENTANYL CITRATE 0.05 MG/ML IJ SOLN
INTRAMUSCULAR | Status: AC
  Filled 2014-03-28: qty 2

## 2014-03-28 MED ORDER — METOCLOPRAMIDE HCL 10 MG PO TABS: 5.0000 mg | ORAL_TABLET | Freq: Three times a day (TID) | ORAL | Status: DC | PRN

## 2014-03-28 MED ORDER — HYDROCODONE-ACETAMINOPHEN 5-325 MG PO TABS
1.0000 | ORAL_TABLET | Freq: Four times a day (QID) | ORAL | Status: DC | PRN
  Administered 2014-03-29 (×2): 2 via ORAL
  Filled 2014-03-28 (×2): qty 2

## 2014-03-28 MED ORDER — ONDANSETRON HCL 4 MG/2ML IJ SOLN: 4.0000 mg | Freq: Four times a day (QID) | INTRAMUSCULAR | Status: DC | PRN

## 2014-03-28 MED ORDER — SODIUM CHLORIDE BACTERIOSTATIC 0.9 % IJ SOLN
INTRAMUSCULAR | Status: AC
  Filled 2014-03-28: qty 20

## 2014-03-28 MED ORDER — PROPOFOL 10 MG/ML IV BOLUS
INTRAVENOUS | Status: AC
  Filled 2014-03-28: qty 20

## 2014-03-28 MED ORDER — ONDANSETRON HCL 4 MG/2ML IJ SOLN
4.0000 mg | Freq: Once | INTRAMUSCULAR | Status: AC | PRN
  Administered 2014-03-28: 4 mg via INTRAVENOUS

## 2014-03-28 MED ORDER — LIDOCAINE HCL (CARDIAC) 10 MG/ML IV SOLN
INTRAVENOUS | Status: DC | PRN
  Administered 2014-03-28: 50 mg via INTRAVENOUS

## 2014-03-28 MED ORDER — SODIUM CHLORIDE BACTERIOSTATIC 0.9 % IJ SOLN
INTRAMUSCULAR | Status: AC
  Filled 2014-03-28: qty 10

## 2014-03-28 MED ORDER — PHENYLEPHRINE HCL 10 MG/ML IJ SOLN
INTRAMUSCULAR | Status: DC | PRN
  Administered 2014-03-28 (×2): 100 ug via INTRAVENOUS
  Administered 2014-03-28: 50 ug via INTRAVENOUS
  Administered 2014-03-28 (×3): 100 ug via INTRAVENOUS
  Administered 2014-03-28 (×2): 50 ug via INTRAVENOUS
  Administered 2014-03-28: 100 ug via INTRAVENOUS
  Administered 2014-03-28: 50 ug via INTRAVENOUS

## 2014-03-28 MED ORDER — EPHEDRINE SULFATE 50 MG/ML IJ SOLN
INTRAMUSCULAR | Status: DC | PRN
  Administered 2014-03-28 (×3): 10 mg via INTRAVENOUS

## 2014-03-28 MED ORDER — LACTATED RINGERS IV SOLN
INTRAVENOUS | Status: DC
  Administered 2014-03-28: 16:00:00 via INTRAVENOUS

## 2014-03-28 MED ORDER — ONDANSETRON HCL 4 MG PO TABS: 4.0000 mg | ORAL_TABLET | Freq: Four times a day (QID) | ORAL | Status: DC | PRN

## 2014-03-28 MED ORDER — ACETAMINOPHEN 650 MG RE SUPP: 650.0000 mg | Freq: Four times a day (QID) | RECTAL | Status: DC | PRN

## 2014-03-28 MED ORDER — FENTANYL CITRATE 0.05 MG/ML IJ SOLN
25.0000 ug | INTRAMUSCULAR | Status: DC
  Administered 2014-03-28: 25 ug via INTRAVENOUS

## 2014-03-28 MED ORDER — LACTATED RINGERS IV SOLN
INTRAVENOUS | Status: DC | PRN
  Administered 2014-03-28 (×2): via INTRAVENOUS

## 2014-03-28 MED ORDER — CEFAZOLIN SODIUM-DEXTROSE 2-3 GM-% IV SOLR
2.0000 g | Freq: Four times a day (QID) | INTRAVENOUS | Status: AC
  Administered 2014-03-28 – 2014-03-29 (×2): 2 g via INTRAVENOUS
  Filled 2014-03-28 (×2): qty 50

## 2014-03-28 MED ORDER — CEFAZOLIN SODIUM-DEXTROSE 2-3 GM-% IV SOLR
2.0000 g | INTRAVENOUS | Status: AC
  Administered 2014-03-28: 2 g via INTRAVENOUS
  Filled 2014-03-28: qty 50

## 2014-03-28 MED ORDER — LIDOCAINE HCL (PF) 1 % IJ SOLN
INTRAMUSCULAR | Status: AC
  Filled 2014-03-28: qty 5

## 2014-03-28 MED ORDER — BUPIVACAINE-EPINEPHRINE (PF) 0.5% -1:200000 IJ SOLN
INTRAMUSCULAR | Status: AC
  Filled 2014-03-28: qty 60

## 2014-03-28 MED ORDER — PROPOFOL INFUSION 10 MG/ML OPTIME
INTRAVENOUS | Status: DC | PRN
  Administered 2014-03-28: 50 ug/kg/min via INTRAVENOUS

## 2014-03-28 MED ORDER — ALUM & MAG HYDROXIDE-SIMETH 200-200-20 MG/5ML PO SUSP
30.0000 mL | ORAL | Status: DC | PRN
Start: 2014-03-28 — End: 2014-03-30

## 2014-03-28 MED ORDER — FENTANYL CITRATE 0.05 MG/ML IJ SOLN: 25.0000 ug | INTRAMUSCULAR | Status: DC | PRN

## 2014-03-28 MED ORDER — PHENOL 1.4 % MT LIQD: 1.0000 | OROMUCOSAL | Status: DC | PRN

## 2014-03-28 MED ORDER — ONDANSETRON HCL 4 MG/2ML IJ SOLN
INTRAMUSCULAR | Status: AC
  Filled 2014-03-28: qty 2

## 2014-03-28 MED ORDER — PHENYLEPHRINE HCL 10 MG/ML IJ SOLN
INTRAMUSCULAR | Status: AC
  Filled 2014-03-28: qty 1

## 2014-03-28 MED ORDER — MAGNESIUM CITRATE PO SOLN: 1.0000 | Freq: Once | ORAL | Status: AC | PRN

## 2014-03-28 MED ORDER — POTASSIUM CHLORIDE IN NACL 20-0.9 MEQ/L-% IV SOLN
INTRAVENOUS | Status: DC
  Administered 2014-03-28 – 2014-03-29 (×2): via INTRAVENOUS

## 2014-03-28 MED ORDER — CEFAZOLIN SODIUM-DEXTROSE 2-3 GM-% IV SOLR
INTRAVENOUS | Status: AC
  Filled 2014-03-28: qty 100

## 2014-03-28 MED ORDER — BISACODYL 10 MG RE SUPP: 10.0000 mg | Freq: Every day | RECTAL | Status: DC | PRN

## 2014-03-28 MED ORDER — CEFAZOLIN SODIUM-DEXTROSE 2-3 GM-% IV SOLR
INTRAVENOUS | Status: AC
  Filled 2014-03-28: qty 50

## 2014-03-28 SURGICAL SUPPLY — 56 items
BAG HAMPER (MISCELLANEOUS) ×2 IMPLANT
BIT DRILL 2.8X128 (BIT) ×2 IMPLANT
BLADE 10 SAFETY STRL DISP (BLADE) ×2 IMPLANT
BLADE HEX COATED 2.75 (ELECTRODE) ×3 IMPLANT
BLADE SAGITTAL 25.0X1.27X90 (BLADE) ×2 IMPLANT
CHLORAPREP W/TINT 26ML (MISCELLANEOUS) ×2 IMPLANT
CLOTH BEACON ORANGE TIMEOUT ST (SAFETY) ×2 IMPLANT
COVER LIGHT HANDLE STERIS (MISCELLANEOUS) ×8 IMPLANT
COVER PROBE W GEL 5X96 (DRAPES) ×2 IMPLANT
DECANTER SPIKE VIAL GLASS SM (MISCELLANEOUS) ×4 IMPLANT
DRAPE HIP W/POCKET STRL (DRAPE) ×2 IMPLANT
DRSG MEPILEX BORDER 4X12 (GAUZE/BANDAGES/DRESSINGS) ×2 IMPLANT
ELECT REM PT RETURN 9FT ADLT (ELECTROSURGICAL) ×2
ELECTRODE REM PT RTRN 9FT ADLT (ELECTROSURGICAL) ×1 IMPLANT
EVACUATOR 3/16  PVC DRAIN (DRAIN)
EVACUATOR 3/16 PVC DRAIN (DRAIN) IMPLANT
FACESHIELD LNG OPTICON STERILE (SAFETY) ×2 IMPLANT
GLOVE BIOGEL PI IND STRL 7.0 (GLOVE) IMPLANT
GLOVE BIOGEL PI INDICATOR 7.0 (GLOVE) ×3
GLOVE EXAM NITRILE LRG STRL (GLOVE) ×1 IMPLANT
GLOVE SKINSENSE NS SZ6.5 (GLOVE) ×3
GLOVE SKINSENSE NS SZ8.0 LF (GLOVE) ×2
GLOVE SKINSENSE STRL SZ6.5 (GLOVE) IMPLANT
GLOVE SKINSENSE STRL SZ8.0 LF (GLOVE) ×2 IMPLANT
GLOVE SS N UNI LF 8.5 STRL (GLOVE) ×2 IMPLANT
GOWN STRL REUS W/TWL LRG LVL3 (GOWN DISPOSABLE) ×4 IMPLANT
GOWN STRL REUS W/TWL XL LVL3 (GOWN DISPOSABLE) ×2 IMPLANT
HIP BIPOLAR TANDEM MED DMND ×1 IMPLANT
INST SET MAJOR BONE (KITS) ×2 IMPLANT
KIT BLADEGUARD II DBL (SET/KITS/TRAYS/PACK) ×2 IMPLANT
KIT ROOM TURNOVER APOR (KITS) ×2 IMPLANT
MANIFOLD NEPTUNE II (INSTRUMENTS) ×2 IMPLANT
MARKER SKIN DUAL TIP RULER LAB (MISCELLANEOUS) ×2 IMPLANT
NDL HYPO 21X1.5 SAFETY (NEEDLE) ×1 IMPLANT
NEEDLE HYPO 21X1.5 SAFETY (NEEDLE) ×4 IMPLANT
NS IRRIG 1000ML POUR BTL (IV SOLUTION) ×2 IMPLANT
PACK ARTHRO LIMB DRAPE STRL (MISCELLANEOUS) ×2 IMPLANT
PACK TOTAL JOINT (CUSTOM PROCEDURE TRAY) ×2 IMPLANT
PAD ARMBOARD 7.5X6 YLW CONV (MISCELLANEOUS) ×2 IMPLANT
PILLOW HIP ABDUCTION LRG (ORTHOPEDIC SUPPLIES) IMPLANT
PILLOW HIP ABDUCTION MED (ORTHOPEDIC SUPPLIES) ×1 IMPLANT
PIN STMN 9X.142 IN (PIN) ×2 IMPLANT
PIN STMN SNGL STERILE 9X3.6MM (PIN) ×2 IMPLANT
SET BASIN LINEN APH (SET/KITS/TRAYS/PACK) ×2 IMPLANT
STAPLER VISISTAT 35W (STAPLE) ×2 IMPLANT
SUT BRALON NAB BRD #1 30IN (SUTURE) ×6 IMPLANT
SUT ETHIBOND 5 LR DA (SUTURE) ×4 IMPLANT
SUT MNCRL 0 VIOLET CTX 36 (SUTURE) ×1 IMPLANT
SUT MON AB 2-0 CT1 36 (SUTURE) ×2 IMPLANT
SUT MONOCRYL 0 CTX 36 (SUTURE) ×2
SUT VIC AB 1 CT1 27 (SUTURE) ×4
SUT VIC AB 1 CT1 27XBRD ANTBC (SUTURE) IMPLANT
SYR 30ML LL (SYRINGE) ×2 IMPLANT
SYR BULB IRRIGATION 50ML (SYRINGE) ×2 IMPLANT
WATER STERILE IRR 1000ML POUR (IV SOLUTION) ×4 IMPLANT
YANKAUER SUCT 12FT TUBE ARGYLE (SUCTIONS) ×2 IMPLANT

## 2014-03-28 NOTE — Clinical Documentation Improvement (Signed)
"  Encephalopathy. This is likely related to pain medication as well as her underlying dementia." documented in progress note 03/28/14.   Please document the Possible, Probable, Suspected or Known TYPE of Encephalopathy:   - Toxic   - Metabolic   - Other type of Encephalopathy   - Unable to Clinically Determine    Thank You,  Erling Conte ,RN BSN CCDS Certified Clinical Documentation Specialist:  [facility]

## 2014-03-28 NOTE — Progress Notes (Signed)
Inpatient Diabetes Program Recommendations  AACE/ADA: New Consensus Statement on Inpatient Glycemic Control (2013)  Target Ranges:  Prepandial:   less than 140 mg/dL      Peak postprandial:   less than 180 mg/dL (1-2 hours)      Critically ill patients:  140 - 180 mg/dL   Results for BARBI, KUMAGAI (MRN [id number]) as of 03/28/2014 13:08  Ref. Range 03/27/2014 07:42 03/27/2014 11:59 03/27/2014 16:35 03/27/2014 21:11 03/28/2014 07:19 03/28/2014 11:08  Glucose-Capillary Latest Range: 70-99 mg/dL 271 (H) 298 (H) 191 (H) 241 (H) 227 (H) 213 (H)   Diabetes history: DM2  Outpatient Diabetes medications: Lantus 3-8 units depending on CBG, Novolog 3 units with supper, Glucovance 5-500 mg BID  Current orders for Inpatient glycemic control: Lantus 14 units QHS, Novolog 0-15 units AC, Novolog 0-5 units HS  Inpatient Diabetes Program Recommendations Insulin - Basal: Please consider increasing Lantus to 18 units QHS. Insulin - Meal Coverage: Please consider ordering Novolog 4 units TID with meals if patient is eating at least 50% of meals.  Thanks, Barnie Alderman, RN, MSN, CCRN Diabetes Coordinator Inpatient Diabetes Program [phone] (Team Pager) [phone] ([facility] office) [phone] [facility] office)

## 2014-03-28 NOTE — Progress Notes (Addendum)
[facility] PROGRESS NOTE  [name] YYQ:[id number] DOB: [birth year] DOA: 03/21/2014 PCP: [name], MD  Summary:  Assessment/Plan: 1. Right hip fracture. Orthopedics following. Patient had taken Plavix prior to admission and therefore surgery was delayed. Plans are for operative repair today. 2. Acute diastolic congestive heart failure. Appears to have approached euvolemic. Lasix has been changed to by mouth. Follow renal function. Will hold lisinopril and spironolactone. Will consider resume this medications tomorrow post op.  3. Acute respiratory failure due to #2. Wean off oxygen as tolerated. Repeat chest xray shows worsening right lower lobe process, concerning for atelectasis, pneumonia or worsening edema.  Since patient does not have fever or leukocytosis, will hold off on antibiotics. Encouraged incentive spirometry.  Patient takes very shallow breaths and it is likely that atelectasis is playing a role. Monitor. Afebrile.  4. History of coronary disease. Plavix on hold secondary to #1 and need for surgery. Will resume when able. 5. Mild metabolic acidosis. Lactic acid was elevated. This has resolved. 6. Diabetes. On Lantus and sliding scale insulin. 7. Encephalopathy; Altered mental status. Family reports that patient is more confused and lethargic. She does have a history of coronary disease. She does not appear to have any ongoing infectious process. This is likely related to pain medication as well as her underlying dementia.  Code Status: DNR Family Communication: discussed with daughter  at the bedside Disposition Plan: pending hospital course, suspect SNF placement   Consultants:  Cardiology  Orthopedics  Procedures:    Antibiotics:  levaquin 5/6>>5/7  HPI/Subjective: No new complaints. Denies chest pain and dyspnea.   Objective: Filed Vitals:   03/28/14 0557  BP: 128/58  Pulse: 90  Temp: 98.6 F (37 C)  Resp: 10    Intake/Output Summary (Last  24 hours) at 03/28/14 1048 Last data filed at 03/27/14 1700  Gross per 24 hour  Intake   1330 ml  Output    600 ml  Net    730 ml   Filed Weights   03/21/14 2120  Weight: 69.5 kg (153 lb 3.5 oz)    Exam:   General:  NAD  Cardiovascular: S1, S2 RRR  Respiratory: CTA B  Abdomen: soft, nt, nd, bs+  Musculoskeletal: No edema in LE bilaterally   Data Reviewed: Basic Metabolic Panel:  Recent Labs Lab 03/23/14 0604 03/24/14 0636 03/25/14 0605 03/26/14 0635 03/27/14 0927  NA 147 146 144 141 134*  K 3.7 3.7 3.4* 3.7 3.5*  CL 105 105 103 100 94*  CO2 _0 GLUCOSE 224* 211* 249* 313* 297*  BUN 23 27* 34* 41* 36*  CREATININE 0.79 0.80 0.78 0.88 0.71  CALCIUM 9.9 10.1 9.8 9.6 9.8   Liver Function Tests:  Recent Labs Lab 03/21/14 1741  AST 28  ALT 18  ALKPHOS 152*  BILITOT 0.9  PROT 7.8  ALBUMIN 3.9    Recent Labs Lab 03/21/14 1741  LIPASE 23   No results found for this basename: AMMONIA,  in the last 168 hours CBC:  Recent Labs Lab 03/21/14 1741 03/22/14 0500 03/23/14 0604 03/25/14 0605 03/26/14 0635  WBC 15.2* 10.5 9.6 7.2 6.7  NEUTROABS 11.4*  --   --   --   --   HGB 14.3 14.1 13.9 12.6 12.8  HCT 40.5 39.9 41.3 37.0 37.6  MCV 98.3 96.8 99.3 100.5* 101.1*  PLT 330 300 243 235 238   Cardiac Enzymes:  Recent Labs Lab 03/21/14 1838  TROPONINI <0.30  BNP (last 3 results)  Recent Labs  06/24/13 0513 02/18/14 0724 03/21/14 1741  PROBNP 768.3* 285.7 556.1*   CBG:  Recent Labs Lab 03/27/14 0742 03/27/14 1159 03/27/14 1635 03/27/14 2111 03/28/14 0719  GLUCAP 271* 298* 191* 241* 227*    Recent Results (from the past 240 hour(s))  URINE CULTURE     Status: None   Collection Time    03/21/14  5:35 PM      Result Value Ref Range Status   Specimen Description URINE, CATHETERIZED   Final   Special Requests NONE   Final   Culture  Setup Time     Final   Value: 03/21/2014 22:00     Performed at [facility] Count     Final   Value: NO GROWTH     Performed at [facility]   Culture     Final   Value: NO GROWTH     Performed at [facility]   Report Status 03/23/2014 FINAL   Final  SURGICAL PCR SCREEN     Status: None   Collection Time    03/28/14  5:19 AM      Result Value Ref Range Status   MRSA, PCR NEGATIVE  NEGATIVE Final   Staphylococcus aureus NEGATIVE  NEGATIVE Final   Comment:            The Xpert SA Assay (FDA     approved for NASAL specimens     in patients over 63 years of age),     is one component of     a comprehensive surveillance     program.  Test performance has     been validated by [employer] for patients greater     than or equal to 59 year old.     It is not intended     to diagnose infection nor to     guide or monitor treatment.     Studies: No results found.  Scheduled Meds: . ALPRAZolam  0.25 mg Oral BID  . atorvastatin  10 mg Oral q1800  .  ceFAZolin (ANCEF) IV  2 g Intravenous On Call to OR  . chlorhexidine  60 mL Topical Once  . docusate sodium  100 mg Oral QHS  . feeding supplement (GLUCERNA SHAKE)  237 mL Oral TID BM  . furosemide  40 mg Oral Daily  . guaiFENesin  1,200 mg Oral BID  . insulin aspart  0-15 Units Subcutaneous TID WC  . insulin aspart  0-5 Units Subcutaneous QHS  . insulin glargine  14 Units Subcutaneous QHS  . lisinopril  20 mg Oral Daily  . metoprolol succinate  50 mg Oral Daily  . mirtazapine  30 mg Oral QHS  . polyethylene glycol  17 g Oral Daily  . spironolactone  25 mg Oral Daily   Continuous Infusions:   Principal Problem:   Hip fracture, right Active Problems:   DIABETES MELLITUS, TYPE II, UNCONTROLLED   Fall   UTI (lower urinary tract infection)   Hip fracture   Acute diastolic congestive heart failure   Preoperative cardiovascular examination    Time spent: 69mns    [name]  [facility] Pager [phone] If 7PM-7AM, please contact night-coverage at  [email or web address], password [email or web address]5/13/2015, 10:48 AM  LOS: 7 days

## 2014-03-28 NOTE — Progress Notes (Signed)
Late entry: patient transported off unit at 1521 for procedure in OR. Telemetry monitor removed. Family went with patient and OR staff to discuss the procedure, risks, benefits, and alternatives.

## 2014-03-28 NOTE — Brief Op Note (Addendum)
03/21/2014 - 03/28/2014  7:38 PM  PATIENT:  [name]  80 y.o. female  PRE-OPERATIVE DIAGNOSIS:  right femoral neck fracture  POST-OPERATIVE DIAGNOSIS:  right femoral neck fracture  PROCEDURE:  Procedure(s): ARTHROPLASTY BIPOLAR HIP (Right) Summit basic depuy 4 stem 50 head + 5 neck  Direct lateral approach   SURGEON:  Surgeon(s) and Role:    * Carole Civil, MD - Primary  PHYSICIAN ASSISTANT:   ASSISTANTS: betty ashley   ANESTHESIA:   spinal  EBL:  Total I/O In: 600 [I.V.:600] Out: -   BLOOD ADMINISTERED:none  DRAINS: none   LOCAL MEDICATIONS USED:  MARCAINE   , Amount: 60 ml and OTHER epi  SPECIMEN:  No Specimen  DISPOSITION OF SPECIMEN:  N/A  COUNTS:  YES  TOURNIQUET:  * No tourniquets in log *  DICTATION: .Dragon Dictation  PLAN OF CARE: Admit to inpatient   PATIENT DISPOSITION:  PACU - hemodynamically stable.   Delay start of Pharmacological VTE agent (>24hrs) due to surgical blood loss or risk of bleeding: yes  DETAILS OF PROCEDURE:  The patient was identified in the preop holding area and the surgical site was marked and countersigned by the surgeon, the chart was updated. The consent was signed.  The patient was taken to the operating room for spinal anesthetic and then placed in the lateral decubitus position with appropriate padding and axillary roll. 2g of ANCEF were given because of the patient's weight of < 80 KG   After sterile prep and drape the timeout was executed  A lateral incision was made centered over the RIGHT greater trochanter to perform a direct lateral approach to the hip. After dividing the subcutaneous tissue down to the fascia, the fascia was split in line with the skin incision. Electrocautery was used to obtain hemostasis.   After deep retractors were placed, the gluteus medius was defined and the anterior half was peeled from the group trochanter in continuity with the vastus lateralis; the anterior branch to the  femoral circumflex artery was cauterized. Subperiosteal dissection continued until the gluteus minimus along with the gluteus medius was retracted proximally.  2 Steinmann pins were placed in the pelvis to retract the Glutei. THE CAPSULE WAQS DIVIDED AND PRESERVED. The hip was dislocated anteriorly, a provisional femoral neck cut was made using the cutting guide. The lesser trochanter was then identified with further soft tissue dissection and a second femoral neck cut was made using the same guide.   A box osteotome was used to lateralize entry point into the femoral canal, this was followed by a femoral canal finder and subsequent broaching up to a size 4 femur. THE HEAD WAS MEASURED TO SIZE 50 Trial reductions were then performed starting with SIZE 4 F  50 H 1.5 NECK LENGTH. Leg length and stability were restored with THESE IMPLANTS . We confirmed knee flexion past 90. ROM: 5 hyperextension with 50 of external rotation. We had adequate shuck test. Sleeping position was stable. At 90 flexion the hip internally rotated 50 without dislocation.  The trial components were then removed, drill holes were placed in the trochanter and  #5 sutures were passed through the drill holes; the stem was placed followed by the femoral head.  The hip was reduced. The ROM tests were repeated and were satisfactory.The # 5 sutures were used to repair the abductors with the leg slightly internally rotated.  The wound was irrigated and 30 cc of Marcaine with epinephrine was injected into the sub-gluteus medius  area AND CAPSULE. Fascia was closed with the leg abducted with #1 Bralon sutures; followed by subfascial injection of 30 cc of Marcaine with epinephrine.  Subcutaneous tissue was closed with 0 Monocryl 2-0 MONOCRYL  Skin staples were used to reapproximate the skin edges and a sterile dressing was applied.  The patient was taken to the recovery room in stable condition.  Standard postop total hip arthroplasty  protocol for direct lateral approach.

## 2014-03-28 NOTE — Op Note (Addendum)
03/21/2014 - 03/28/2014  7:38 PM  PATIENT:  [name]  80 y.o. female  PRE-OPERATIVE DIAGNOSIS:  right femoral neck fracture  POST-OPERATIVE DIAGNOSIS:  right femoral neck fracture  FINDINGS: DISPLACED FEMORAL NECK FRACTURE   PROCEDURE:  Procedure(s): ARTHROPLASTY BIPOLAR HIP (Right) Summit basic depuy 4 stem 50 head + 5 neck  Direct lateral approach   SURGEON:  Surgeon(s) and Role:    * Carole Civil, MD - Primary  PHYSICIAN ASSISTANT:   ASSISTANTS: betty ashley   ANESTHESIA:   spinal  EBL:  Total I/O In: 600 [I.V.:600] Out: -   BLOOD ADMINISTERED:none  DRAINS: none   LOCAL MEDICATIONS USED:  MARCAINE   , Amount: 60 ml and OTHER epi  SPECIMEN:  No Specimen  DISPOSITION OF SPECIMEN:  N/A  COUNTS:  YES  TOURNIQUET:  * No tourniquets in log *  DICTATION: .Dragon Dictation  PLAN OF CARE: Admit to inpatient   PATIENT DISPOSITION:  PACU - hemodynamically stable.   Delay start of Pharmacological VTE agent (>24hrs) due to surgical blood loss or risk of bleeding: yes  DETAILS OF PROCEDURE:  The patient was identified in the preop holding area and the surgical site was marked and countersigned by the surgeon, the chart was updated. The consent was signed.  The patient was taken to the operating room for spinal anesthetic and then placed in the lateral decubitus position with appropriate padding and axillary roll. 2g of ANCEF were given because of the patient's weight of < 80 KG   After sterile prep and drape the timeout was executed  A lateral incision was made centered over the RIGHT greater trochanter to perform a direct lateral approach to the hip. After dividing the subcutaneous tissue down to the fascia, the fascia was split in line with the skin incision. Electrocautery was used to obtain hemostasis.   After deep retractors were placed, the gluteus medius was defined and the anterior half was peeled from the group trochanter in continuity with the  vastus lateralis; the anterior branch to the femoral circumflex artery was cauterized. Subperiosteal dissection continued until the gluteus minimus along with the gluteus medius was retracted proximally.  2 Steinmann pins were placed in the pelvis to retract the Glutei. THE CAPSULE WAQS DIVIDED AND PRESERVED. The hip was dislocated anteriorly, a provisional femoral neck cut was made using the cutting guide. The lesser trochanter was then identified with further soft tissue dissection and a second femoral neck cut was made using the same guide.   A box osteotome was used to lateralize entry point into the femoral canal, this was followed by a femoral canal finder and subsequent broaching up to a size 4 femur. THE HEAD WAS MEASURED TO SIZE 50 Trial reductions were then performed starting with SIZE 4 F  50 H 1.5 NECK LENGTH. Leg length and stability were restored with THESE IMPLANTS . We confirmed knee flexion past 90. ROM: 5 hyperextension with 50 of external rotation. We had adequate shuck test. Sleeping position was stable. At 90 flexion the hip internally rotated 50 without dislocation.  The trial components were then removed, drill holes were placed in the trochanter and  #5 sutures were passed through the drill holes; the stem was placed followed by the femoral head.  The hip was reduced. The ROM tests were repeated and were satisfactory.The # 5 sutures were used to repair the abductors with the leg slightly internally rotated.  The wound was irrigated and 30 cc of Marcaine with  epinephrine was injected into the sub-gluteus medius area AND CAPSULE. Fascia was closed with the leg abducted with #1 Bralon sutures; followed by subfascial injection of 30 cc of Marcaine with epinephrine.  Subcutaneous tissue was closed with 0 Monocryl 2-0 MONOCRYL  Skin staples were used to reapproximate the skin edges and a sterile dressing was applied.  The patient was taken to the recovery room in stable  condition.  Standard postop total hip arthroplasty protocol for direct lateral approach.

## 2014-03-28 NOTE — Progress Notes (Signed)
CSW met with 2 of patient's daughters and 1 son to discuss bed offers received.  Family is adamant that patient be placed at the [facility]. CSW received bed offer from the [facility] today but explained to family that when patient is medically stable for d/c there might NOT be a vacancy and they must consider a second choice.  While family is able to understand reasoning for this they feel strongly that the [facility] is the only bed of choice. CSW left message for Marianna Fuss- Admissions at [facility] to explain family's strong desire for this facility. Family provided a SNF list to review and notified that bed offers received from the other facilities in [place]. They stated that they would consider other facilities but remain hopeful for the [facility]. CSW provided support and patient was being prepared for surgery this afternoon.  Lorie Phenix. [place], [place]

## 2014-03-28 NOTE — Anesthesia Preprocedure Evaluation (Signed)
Anesthesia Evaluation  Patient identified by MRN, date of birth, ID band Patient awake    Reviewed: Allergy & Precautions, H&P , NPO status , Patient's Chart, lab work & pertinent test results  Airway Mallampati: II TM Distance: >3 FB     Dental  (+) Edentulous Upper   Pulmonary shortness of breath and with exertion, former smoker,  breath sounds clear to auscultation        Cardiovascular hypertension, + CAD, + Peripheral Vascular Disease and +CHF + dysrhythmias Supra Ventricular Tachycardia Rhythm:Regular     Neuro/Psych  Headaches, PSYCHIATRIC DISORDERS Anxiety Depression    GI/Hepatic   Endo/Other  diabetes, Type 2, Insulin Dependent, Oral Hypoglycemic Agents  Renal/GU      Musculoskeletal   Abdominal   Peds  Hematology   Anesthesia Other Findings   Reproductive/Obstetrics                           Anesthesia Physical Anesthesia Plan  ASA: III  Anesthesia Plan: Spinal and MAC   Post-op Pain Management:    Induction: Intravenous  Airway Management Planned: Simple Face Mask  Additional Equipment:   Intra-op Plan:   Post-operative Plan:   Informed Consent: I have reviewed the patients History and Physical, chart, labs and discussed the procedure including the risks, benefits and alternatives for the proposed anesthesia with the patient or authorized representative who has indicated his/her understanding and acceptance.     Plan Discussed with:   Anesthesia Plan Comments:         Anesthesia Quick Evaluation

## 2014-03-28 NOTE — Progress Notes (Signed)
Lab attempted x2 to draw baseline cbc and serum creatinine levels due to patient starting lovenox. Attempts failed. Family at bedside and refused to have patient stuck again. Contacted midlevel provider to inform of this.

## 2014-03-28 NOTE — Anesthesia Procedure Notes (Addendum)
Date/Time: 03/28/2014 5:37 PM Performed by: Andree Elk, AMY A Pre-anesthesia Checklist: Timeout performed, Patient identified, Emergency Drugs available, Suction available and Patient being monitored Oxygen Delivery Method: Simple face mask   Spinal  Patient location during procedure: OR Start time: 03/28/2014 5:58 PM Staffing Anesthesiologist: Lerry Liner Preanesthetic Checklist Completed: patient identified, site marked, surgical consent, pre-op evaluation, timeout performed, IV checked, risks and benefits discussed and monitors and equipment checked Spinal Block Patient position: right lateral decubitus Prep: Betadine Patient monitoring: heart rate, cardiac monitor, continuous pulse ox and blood pressure Approach: right paramedian Location: L2-3 Injection technique: single-shot Needle Needle type: Spinocan  Needle gauge: 22 G Needle length: 9 cm Assessment Sensory level: T8 Additional Notes ATTEMPTS:2 TRAY [id number] TRAY EXPIRATION DATE:04/2015

## 2014-03-28 NOTE — Anesthesia Postprocedure Evaluation (Signed)
  Anesthesia Post-op Note  Patient: [name]  Procedure(s) Performed: Procedure(s): ARTHROPLASTY BIPOLAR HIP (Right)  Patient Location: PACU  Anesthesia Type:Spinal  Level of Consciousness: awake, alert , patient cooperative and confused  Airway and Oxygen Therapy: Patient Spontanous Breathing and Patient connected to nasal cannula oxygen  Post-op Pain: none  Post-op Assessment: Post-op Vital signs reviewed, Patient's Cardiovascular Status Stable, Respiratory Function Stable, Patent Airway, NAUSEA AND VOMITING PRESENT and Pain level controlled  Post-op Vital Signs: Reviewed and stable  Last Vitals:  Filed Vitals:   03/28/14 1546  BP: 119/72  Pulse:   Temp:   Resp:     Complications: No apparent anesthesia complications

## 2014-03-28 NOTE — Transfer of Care (Signed)
Immediate Anesthesia Transfer of Care Note  Patient: [name]  Procedure(s) Performed: Procedure(s): ARTHROPLASTY BIPOLAR HIP (Right)  Patient Location: PACU  Anesthesia Type:Spinal  Level of Consciousness: awake, alert , patient cooperative and confused  Airway & Oxygen Therapy: Patient Spontanous Breathing and Patient connected to nasal cannula oxygen  Post-op Assessment: Report given to PACU RN and Post -op Vital signs reviewed and stable  Post vital signs: Reviewed and stable  Complications: No apparent anesthesia complications

## 2014-03-29 ENCOUNTER — Encounter (HOSPITAL_COMMUNITY): Payer: Self-pay | Admitting: Orthopedic Surgery

## 2014-03-29 LAB — GLUCOSE, CAPILLARY
Glucose-Capillary: 183 mg/dL — ABNORMAL HIGH (ref 70–99)
Glucose-Capillary: 240 mg/dL — ABNORMAL HIGH (ref 70–99)
Glucose-Capillary: 248 mg/dL — ABNORMAL HIGH (ref 70–99)
Glucose-Capillary: 161 mg/dL — ABNORMAL HIGH (ref 70–99)

## 2014-03-29 LAB — CBC
HCT: 31.8 % — ABNORMAL LOW (ref 36.0–46.0)
Hemoglobin: 10.8 g/dL — ABNORMAL LOW (ref 12.0–15.0)
MCH: 33.9 pg (ref 26.0–34.0)
MCHC: 34 g/dL (ref 30.0–36.0)
MCV: 99.7 fL (ref 78.0–100.0)
Platelets: 219 10*3/uL (ref 150–400)
RBC: 3.19 MIL/uL — ABNORMAL LOW (ref 3.87–5.11)
RDW: 13 % (ref 11.5–15.5)
WBC: 6.3 10*3/uL (ref 4.0–10.5)

## 2014-03-29 LAB — BASIC METABOLIC PANEL
BUN: 30 mg/dL — ABNORMAL HIGH (ref 6–23)
CO2: 22 mEq/L (ref 19–32)
Calcium: 9 mg/dL (ref 8.4–10.5)
Chloride: 103 mEq/L (ref 96–112)
Creatinine, Ser: 0.63 mg/dL (ref 0.50–1.10)
GFR calc Af Amer: >90 mL/min (ref >90)
GFR calc non Af Amer: 82 mL/min — ABNORMAL LOW (ref >90)
Glucose, Bld: 198 mg/dL — ABNORMAL HIGH (ref 70–99)
Potassium: 4.1 mEq/L (ref 3.7–5.3)
Sodium: 138 mEq/L (ref 137–147)

## 2014-03-29 MED ORDER — PANTOPRAZOLE SODIUM 40 MG PO TBEC
40.0000 mg | DELAYED_RELEASE_TABLET | Freq: Every day | ORAL | Status: DC
  Administered 2014-03-29 – 2014-03-30 (×2): 40 mg via ORAL
  Filled 2014-03-29 (×2): qty 1

## 2014-03-29 MED ORDER — ALPRAZOLAM 0.25 MG PO TABS: 0.2500 mg | ORAL_TABLET | Freq: Two times a day (BID) | ORAL | Status: DC | PRN

## 2014-03-29 MED ORDER — SPIRONOLACTONE 25 MG PO TABS
25.0000 mg | ORAL_TABLET | Freq: Every day | ORAL | Status: DC
  Administered 2014-03-30: 25 mg via ORAL
  Filled 2014-03-29: qty 1

## 2014-03-29 MED ORDER — POLYETHYLENE GLYCOL 3350 17 G PO PACK
17.0000 g | PACK | Freq: Two times a day (BID) | ORAL | Status: DC
  Administered 2014-03-29 – 2014-03-30 (×2): 17 g via ORAL
  Filled 2014-03-29 (×2): qty 1

## 2014-03-29 MED ORDER — HYDROCODONE-ACETAMINOPHEN 5-325 MG PO TABS
1.0000 | ORAL_TABLET | Freq: Four times a day (QID) | ORAL | Status: DC | PRN
  Administered 2014-03-29 – 2014-03-30 (×3): 1 via ORAL
  Filled 2014-03-29 (×3): qty 1

## 2014-03-29 MED ORDER — DOCUSATE SODIUM 100 MG PO CAPS
100.0000 mg | ORAL_CAPSULE | Freq: Two times a day (BID) | ORAL | Status: DC
  Administered 2014-03-30: 100 mg via ORAL
  Filled 2014-03-29: qty 1

## 2014-03-29 MED ORDER — KETOROLAC TROMETHAMINE 15 MG/ML IJ SOLN
15.0000 mg | Freq: Three times a day (TID) | INTRAMUSCULAR | Status: DC | PRN
  Administered 2014-03-29 – 2014-03-30 (×3): 15 mg via INTRAVENOUS
  Filled 2014-03-29 (×3): qty 1

## 2014-03-29 NOTE — Addendum Note (Signed)
Addendum created 03/29/14 1221 by Mickel Baas, CRNA   Modules edited: Notes Section   Notes Section:  File: [id number]

## 2014-03-29 NOTE — Anesthesia Postprocedure Evaluation (Signed)
  Anesthesia Post-op Note  Patient: [name]  Procedure(s) Performed: Procedure(s): ARTHROPLASTY BIPOLAR HIP (Right)  Patient Location: Room 330  Anesthesia Type:Spinal  Level of Consciousness: awake, alert , patient cooperative and confused  Airway and Oxygen Therapy: Patient Spontanous Breathing and Patient connected to nasal cannula oxygen  Post-op Pain: mild  Post-op Assessment: Post-op Vital signs reviewed, Patient's Cardiovascular Status Stable, Respiratory Function Stable, Patent Airway, No signs of Nausea or vomiting and Pain level controlled  Post-op Vital Signs: Reviewed and stable  Last Vitals:  Filed Vitals:   03/29/14 0941  BP: 133/69  Pulse: 89  Temp:   Resp: 20    Complications: No apparent anesthesia complications

## 2014-03-29 NOTE — Evaluation (Signed)
Physical Therapy Evaluation Patient Details Name: [name] MRN: [id number] DOB: February 07, 1931 Today's Date: 03/29/2014   History of Present Illness  80 year old female who   has a past medical history of Hemorrhage of gastrointestinal tract, unspecified; Other malaise and fatigue; Tobacco use disorder; Parotid tumor; Neoplasm of malignant; Diabetes mellitus; Osteopenia; Hypertension; Hyperlipidemia; Depression; Anxiety state, unspecified; Chronic headaches; Carotid artery disease; and Orthostatic hypotension.Pt fell at home sustaining a right hip Fx. Pt s/p R hip hemiarthroplasty.  Clinical Impression  Pt presents with significant dependencies in mobility. Pt was nonverbal throughout the session except one time she said "NO" while I was doing R LE ROM exercises. Pt was total assist to sit EOB and was not able to actively follow any commands. Pt required total assist for sitting balance and made no attempt to actively hold the bed rail or maintain balance. Pt's would benefit from a trial of PT to see if she is able to actively participate in therapy and progress. I would recommend d/c to SNF.    Follow Up Recommendations SNF    Equipment Recommendations  None recommended by PT    Recommendations for Other Services       Precautions / Restrictions Precautions Precautions: Other (comment) (direct lateral approach) Restrictions Weight Bearing Restrictions: Yes RLE Weight Bearing: Weight bearing as tolerated      Mobility  Bed Mobility Overal bed mobility: +2 for physical assistance Bed Mobility: Supine to Sit;Sit to Supine     Supine to sit: Total assist Sit to supine: Total assist   General bed mobility comments: pt did not assist with any aspect of bed mobility, frequent rest breaks for pain. Pt sat EOB x5 mins with total assist for balance support   Transfers                    Ambulation/Gait                Stairs            Wheelchair Mobility     Modified Rankin (Stroke Patients Only)       Balance Overall balance assessment: Needs assistance Sitting-balance support: Feet supported Sitting balance-Leahy Scale: Zero Sitting balance - Comments: Pt with decreased arousal suspect due to medications. Pt's son reports she has not been verbal with him at all. Pt did verbailze saying "NO' once while I was doing ROM exercises on the R LE. Pt was not able to follow single step commands. Postural control: Posterior lean;Left lateral lean                                   Pertinent Vitals/Pain     Home Living Family/patient expects to be discharged to:: Skilled nursing facility   Available Help at Discharge: Family Type of Home: House Home Access: Ramped entrance       Home Equipment: Shower seat;Walker - 2 wheels;Grab bars - tub/shower;Hand held shower head;Bedside commode Additional Comments: pt mainly in w/c at home and just started ambulating a few steps with HHPT.    Prior Function Level of Independence: Needs assistance               Hand Dominance        Extremity/Trunk Assessment   Upper Extremity Assessment: Defer to OT evaluation           Lower Extremity Assessment: Difficult to assess due to impaired cognition  Communication   Communication: Expressive difficulties (pt was nonverbal excepts said "no" when i attempted to move her leg.)  Cognition Arousal/Alertness: Lethargic;Suspect due to medications Behavior During Therapy: Flat affect Overall Cognitive Status: Difficult to assess                      General Comments      Exercises Total Joint Exercises Ankle Circles/Pumps: PROM;Strengthening;Right;10 reps;Supine Heel Slides: PROM;Strengthening;Right;10 reps      Assessment/Plan    PT Assessment Patient needs continued PT services  PT Diagnosis Acute pain;Generalized weakness;Difficulty walking   PT Problem List Decreased strength;Decreased range  of motion;Decreased activity tolerance;Decreased balance;Decreased mobility;Decreased cognition;Decreased safety awareness;Decreased knowledge of precautions;Pain  PT Treatment Interventions DME instruction;Therapeutic activities;Therapeutic exercise;Balance training;Patient/family education;Gait training   PT Goals (Current goals can be found in the Care Plan section) Acute Rehab PT Goals PT Goal Formulation: Patient unable to participate in goal setting Time For Goal Achievement: 04/05/14 Potential to Achieve Goals: Fair    Frequency Min 5X/week   Barriers to discharge        Co-evaluation               End of Session Equipment Utilized During Treatment: Oxygen Activity Tolerance: Patient limited by lethargy Patient left: in bed;with call bell/phone within reach;with family/visitor present           Time: 4039-7953 PT Time Calculation (min): 24 min   Charges:   PT Evaluation $Initial PT Evaluation Tier I: 1 Procedure PT Treatments $Therapeutic Activity: 8-22 mins   PT G Codes:          Lelon Mast 03/29/2014, 12:22 PM

## 2014-03-29 NOTE — Progress Notes (Signed)
[facility] PROGRESS NOTE  [name] LNL:[id number] DOB: [birth year] DOA: 03/21/2014 PCP: [name], MD  Summary:  Assessment/Plan: 1. Right hip fracture. Orthopedics following. Patient had taken Plavix prior to admission and therefore surgery was delayed. Patient S/P Arthroplasty bipolar of right hip. PT, OT evaluation. I will adjust pain medications to avoid oversedation. Increase frequency of bowel regimen. Lovenox for DVT prophylaxis.  2. Acute diastolic congestive heart failure. Appears to have approached euvolemic. Lasix has been changed to by mouth. Follow renal function. Will hold lisinopril. Will resume spironolactone.  3. Acute respiratory failure due to #2. Wean off oxygen as tolerated. Repeat chest xray shows worsening right lower lobe process, concerning for atelectasis, pneumonia or worsening edema.  Since patient does not have fever or leukocytosis, will hold off on antibiotics. Encouraged incentive spirometry.  Patient takes very shallow breaths and it is likely that atelectasis is playing a role. Monitor. Afebrile.  4. History of coronary disease. Plavix on hold secondary to #1 and need for surgery. Will resume when able. 5. Mild metabolic acidosis. Lactic acid was elevated. This has resolved. 6. Diabetes. On Lantus and sliding scale insulin. 7. Encephalopathy; toxic secondary to medication.  Altered mental status. She does not appear to have any ongoing infectious process. This is likely related to pain medication as well as her underlying dementia. I will discontinue IV morphine, robaxin. Will change Vicodin to 1 tablet. Will add Toradol to try to control pain.  8. Acute blood loss anemia; post surgery expected. Monitor hb.   Code Status: DNR Family Communication: discussed with daughter  at the bedside Disposition Plan: pending hospital course, suspect SNF placement   Consultants:  Cardiology  Orthopedics  Procedures:    Antibiotics:  levaquin  5/6>>5/7  HPI/Subjective: Patient lethargic, she has received robaxin yesterday, IV morphine and vicodin. She open eyes, follow some command.   Objective: Filed Vitals:   03/29/14 1231  BP: 115/63  Pulse: 82  Temp: 99.5 F (37.5 C)  Resp:     Intake/Output Summary (Last 24 hours) at 03/29/14 1250 Last data filed at 03/29/14 0900  Gross per 24 hour  Intake   2660 ml  Output    850 ml  Net   1810 ml   Filed Weights   03/21/14 2120  Weight: 69.5 kg (153 lb 3.5 oz)    Exam:   General:  NAD  Cardiovascular: S1, S2 RRR  Respiratory: CTA B  Abdomen: soft, nt, nd, bs+  Musculoskeletal: No edema in LE bilaterally   Data Reviewed: Basic Metabolic Panel:  Recent Labs Lab 03/25/14 0605 03/26/14 0635 03/27/14 0927 03/28/14 1116 03/29/14 0600  NA 144 141 134* 137 138  K 3.4* 3.7 3.5* 4.2 4.1  CL 103 100 94* 100 103  CO2 _0 GLUCOSE 249* 313* 297* 246* 198*  BUN 34* 41* 36* 37* 30*  CREATININE 0.78 0.88 0.71 0.73 0.63  CALCIUM 9.8 9.6 9.8 10.0 9.0   Liver Function Tests: No results found for this basename: AST, ALT, ALKPHOS, BILITOT, PROT, ALBUMIN,  in the last 168 hours No results found for this basename: LIPASE, AMYLASE,  in the last 168 hours No results found for this basename: AMMONIA,  in the last 168 hours CBC:  Recent Labs Lab 03/23/14 0604 03/25/14 0605 03/26/14 0635 03/29/14 0600  WBC 9.6 7.2 6.7 6.3  HGB 13.9 12.6 12.8 10.8*  HCT 41.3 37.0 37.6 31.8*  MCV 99.3 100.5* 101.1* 99.7  PLT 243 235 238  219   Cardiac Enzymes: No results found for this basename: CKTOTAL, CKMB, CKMBINDEX, TROPONINI,  in the last 168 hours BNP (last 3 results)  Recent Labs  06/24/13 0513 02/18/14 0724 03/21/14 1741  PROBNP 768.3* 285.7 556.1*   CBG:  Recent Labs Lab 03/28/14 1108 03/28/14 1609 03/28/14 2054 03/29/14 0739 03/29/14 1134  GLUCAP 213* 143* 155* 183* 240*    Recent Results (from the past 240 hour(s))  URINE CULTURE      Status: None   Collection Time    03/21/14  5:35 PM      Result Value Ref Range Status   Specimen Description URINE, CATHETERIZED   Final   Special Requests NONE   Final   Culture  Setup Time     Final   Value: 03/21/2014 22:00     Performed at [facility] Count     Final   Value: NO GROWTH     Performed at [facility]   Culture     Final   Value: NO GROWTH     Performed at [facility]   Report Status 03/23/2014 FINAL   Final  SURGICAL PCR SCREEN     Status: None   Collection Time    03/28/14  5:19 AM      Result Value Ref Range Status   MRSA, PCR NEGATIVE  NEGATIVE Final   Staphylococcus aureus NEGATIVE  NEGATIVE Final   Comment:            The Xpert SA Assay (FDA     approved for NASAL specimens     in patients over 37 years of age),     is one component of     a comprehensive surveillance     program.  Test performance has     been validated by [employer] for patients greater     than or equal to 65 year old.     It is not intended     to diagnose infection nor to     guide or monitor treatment.     Studies: Dg Pelvis Portable  03/28/2014   CLINICAL DATA:  Postop right hip arthroplasty  EXAM: PORTABLE PELVIS 1-2 VIEWS  COMPARISON:  03/21/2014  FINDINGS: Right hip arthroplasty changes noted. Alignment appears anatomic in the frontal plane. No hardware or osseous abnormality. Visualized pelvis intact. Minor degenerative changes of the left hip.  IMPRESSION: Status post right hip arthroplasty.  No complicating feature.   Electronically Signed   By: Daryll Brod M.D.   On: 03/28/2014 20:05    Scheduled Meds: . atorvastatin  10 mg Oral q1800  . docusate sodium  100 mg Oral BID  . enoxaparin (LOVENOX) injection  30 mg Subcutaneous Q24H  . feeding supplement (GLUCERNA SHAKE)  237 mL Oral TID BM  . furosemide  40 mg Oral Daily  . guaiFENesin  1,200 mg Oral BID  . insulin aspart  0-15 Units Subcutaneous TID WC  . insulin aspart   0-5 Units Subcutaneous QHS  . insulin glargine  14 Units Subcutaneous QHS  . metoprolol succinate  50 mg Oral Daily  . mirtazapine  30 mg Oral QHS  . polyethylene glycol  17 g Oral BID   Continuous Infusions:   Principal Problem:   Hip fracture, right Active Problems:   DIABETES MELLITUS, TYPE II, UNCONTROLLED   Fall   UTI (lower urinary tract infection)   Hip fracture   Acute  diastolic congestive heart failure   Preoperative cardiovascular examination   Fracture of femoral neck, right, closed    Time spent: 44mns    [name]  [facility] Pager [phone] If 7PM-7AM, please contact night-coverage at [email or web address], password [email or web address]5/14/2015, 12:50 PM  LOS: 8 days

## 2014-03-29 NOTE — Progress Notes (Signed)
Subjective: 1 Day Post-Op Procedure(s) (LRB): ARTHROPLASTY BIPOLAR HIP (Right) Patient reports pain as Cannot assess pain levels at this time..    Objective: Vital signs in last 24 hours: Temp:  [97.6 F (36.4 C)-98.5 F (36.9 C)] 98.5 F (36.9 C) (05/14 0535) Pulse Rate:  [80-92] 83 (05/14 0535) Resp:  [15-22] 20 (05/14 0535) BP: (67-142)/(38-75) 127/53 mmHg (05/14 0535) SpO2:  [90 %-100 %] 100 % (05/14 0535)  Intake/Output from previous day: 05/13 0701 - 05/14 0700 In: 1700 [I.V.:1700] Out: 1425 [Urine:1325; Blood:100] Intake/Output this shift:     Recent Labs  03/29/14 0600  HGB 10.8*    Recent Labs  03/29/14 0600  WBC 6.3  RBC 3.19*  HCT 31.8*  PLT 219    Recent Labs  03/28/14 1116 03/29/14 0600  NA 137 138  K 4.2 4.1  CL 100 103  CO2 24 22  BUN 37* 30*  CREATININE 0.73 0.63  GLUCOSE 246* 198*  CALCIUM 10.0 9.0    Recent Labs  03/28/14 0437  INR 1.26    Neurologically intact Neurovascular intact Sensation intact distally Intact pulses distally Dorsiflexion/Plantar flexion intact Incision: scant drainage  Assessment/Plan: 1 Day Post-Op Procedure(s) (LRB): ARTHROPLASTY BIPOLAR HIP (Right) Up with therapy, try to sit the patient up today   Carole Civil 03/29/2014, 7:34 AM

## 2014-03-29 NOTE — Progress Notes (Signed)
Pt not able todo the incentive at 1515. RT will try again tonight

## 2014-03-29 NOTE — Progress Notes (Signed)
Foley catheter removed at 0700 this morning. Bladder scan performed at 1505 showing 446m of residual urine. Dr. RTyrell Antoniopaged. She advised to urge patient to void again before replacing foley catheter. Stated that she did not want and in-and-out cath placed but instead place an indwelling foley catheter for 24 hours, if patient unable to void.  Will continue monitor.

## 2014-03-29 NOTE — Progress Notes (Signed)
Patient voided. Unable to measure amount of urine due to incontinence.

## 2014-03-30 ENCOUNTER — Inpatient Hospital Stay
Admission: RE | Admit: 2014-03-30 | Discharge: 2014-04-16 | Disposition: A | Discharge status: Other Acute Inpatient Hospital | Payer: TRICARE For Life (TFL) | Source: Ambulatory Visit | Attending: Internal Medicine | Admitting: Internal Medicine

## 2014-03-30 ENCOUNTER — Other Ambulatory Visit: Payer: Self-pay | Admitting: *Deleted

## 2014-03-30 DIAGNOSIS — Z794 Long term (current) use of insulin: Secondary | ICD-10-CM | POA: Diagnosis not present

## 2014-03-30 DIAGNOSIS — S72009A Fracture of unspecified part of neck of unspecified femur, initial encounter for closed fracture: Secondary | ICD-10-CM | POA: Diagnosis not present

## 2014-03-30 DIAGNOSIS — I509 Heart failure, unspecified: Secondary | ICD-10-CM | POA: Diagnosis present

## 2014-03-30 DIAGNOSIS — F329 Major depressive disorder, single episode, unspecified: Secondary | ICD-10-CM | POA: Diagnosis present

## 2014-03-30 DIAGNOSIS — L89609 Pressure ulcer of unspecified heel, unspecified stage: Secondary | ICD-10-CM | POA: Diagnosis present

## 2014-03-30 DIAGNOSIS — J984 Other disorders of lung: Secondary | ICD-10-CM | POA: Diagnosis not present

## 2014-03-30 DIAGNOSIS — L8995 Pressure ulcer of unspecified site, unstageable: Secondary | ICD-10-CM | POA: Diagnosis present

## 2014-03-30 DIAGNOSIS — D649 Anemia, unspecified: Secondary | ICD-10-CM | POA: Diagnosis present

## 2014-03-30 DIAGNOSIS — E119 Type 2 diabetes mellitus without complications: Secondary | ICD-10-CM | POA: Diagnosis not present

## 2014-03-30 DIAGNOSIS — I658 Occlusion and stenosis of other precerebral arteries: Secondary | ICD-10-CM | POA: Diagnosis present

## 2014-03-30 DIAGNOSIS — Z8051 Family history of malignant neoplasm of kidney: Secondary | ICD-10-CM | POA: Diagnosis not present

## 2014-03-30 DIAGNOSIS — Z9181 History of falling: Secondary | ICD-10-CM | POA: Diagnosis not present

## 2014-03-30 DIAGNOSIS — Z96649 Presence of unspecified artificial hip joint: Secondary | ICD-10-CM | POA: Diagnosis not present

## 2014-03-30 DIAGNOSIS — Z7902 Long term (current) use of antithrombotics/antiplatelets: Secondary | ICD-10-CM | POA: Diagnosis not present

## 2014-03-30 DIAGNOSIS — I2510 Atherosclerotic heart disease of native coronary artery without angina pectoris: Secondary | ICD-10-CM | POA: Diagnosis present

## 2014-03-30 DIAGNOSIS — E1142 Type 2 diabetes mellitus with diabetic polyneuropathy: Secondary | ICD-10-CM | POA: Diagnosis present

## 2014-03-30 DIAGNOSIS — F3289 Other specified depressive episodes: Secondary | ICD-10-CM | POA: Diagnosis not present

## 2014-03-30 DIAGNOSIS — Z9010 Acquired absence of unspecified breast and nipple: Secondary | ICD-10-CM | POA: Diagnosis not present

## 2014-03-30 DIAGNOSIS — R7401 Elevation of levels of liver transaminase levels: Secondary | ICD-10-CM | POA: Diagnosis present

## 2014-03-30 DIAGNOSIS — I5031 Acute diastolic (congestive) heart failure: Secondary | ICD-10-CM | POA: Diagnosis not present

## 2014-03-30 DIAGNOSIS — E871 Hypo-osmolality and hyponatremia: Secondary | ICD-10-CM | POA: Diagnosis present

## 2014-03-30 DIAGNOSIS — Z8249 Family history of ischemic heart disease and other diseases of the circulatory system: Secondary | ICD-10-CM | POA: Diagnosis not present

## 2014-03-30 DIAGNOSIS — R279 Unspecified lack of coordination: Secondary | ICD-10-CM | POA: Diagnosis not present

## 2014-03-30 DIAGNOSIS — IMO0001 Reserved for inherently not codable concepts without codable children: Secondary | ICD-10-CM | POA: Diagnosis not present

## 2014-03-30 DIAGNOSIS — I471 Supraventricular tachycardia, unspecified: Secondary | ICD-10-CM | POA: Diagnosis not present

## 2014-03-30 DIAGNOSIS — I5032 Chronic diastolic (congestive) heart failure: Secondary | ICD-10-CM | POA: Diagnosis present

## 2014-03-30 DIAGNOSIS — Z853 Personal history of malignant neoplasm of breast: Secondary | ICD-10-CM | POA: Diagnosis not present

## 2014-03-30 DIAGNOSIS — R404 Transient alteration of awareness: Secondary | ICD-10-CM | POA: Diagnosis not present

## 2014-03-30 DIAGNOSIS — M6281 Muscle weakness (generalized): Secondary | ICD-10-CM | POA: Diagnosis not present

## 2014-03-30 DIAGNOSIS — E1149 Type 2 diabetes mellitus with other diabetic neurological complication: Secondary | ICD-10-CM | POA: Diagnosis not present

## 2014-03-30 DIAGNOSIS — F411 Generalized anxiety disorder: Secondary | ICD-10-CM | POA: Diagnosis not present

## 2014-03-30 DIAGNOSIS — Z801 Family history of malignant neoplasm of trachea, bronchus and lung: Secondary | ICD-10-CM | POA: Diagnosis not present

## 2014-03-30 DIAGNOSIS — F0390 Unspecified dementia without behavioral disturbance: Secondary | ICD-10-CM | POA: Diagnosis not present

## 2014-03-30 DIAGNOSIS — I10 Essential (primary) hypertension: Secondary | ICD-10-CM | POA: Diagnosis not present

## 2014-03-30 DIAGNOSIS — R41841 Cognitive communication deficit: Secondary | ICD-10-CM | POA: Diagnosis not present

## 2014-03-30 DIAGNOSIS — R002 Palpitations: Secondary | ICD-10-CM | POA: Diagnosis not present

## 2014-03-30 DIAGNOSIS — Z87891 Personal history of nicotine dependence: Secondary | ICD-10-CM | POA: Diagnosis not present

## 2014-03-30 DIAGNOSIS — R079 Chest pain, unspecified: Secondary | ICD-10-CM | POA: Diagnosis not present

## 2014-03-30 DIAGNOSIS — I6529 Occlusion and stenosis of unspecified carotid artery: Secondary | ICD-10-CM | POA: Diagnosis present

## 2014-03-30 DIAGNOSIS — R262 Difficulty in walking, not elsewhere classified: Secondary | ICD-10-CM | POA: Diagnosis not present

## 2014-03-30 DIAGNOSIS — R748 Abnormal levels of other serum enzymes: Secondary | ICD-10-CM | POA: Diagnosis not present

## 2014-03-30 DIAGNOSIS — N390 Urinary tract infection, site not specified: Secondary | ICD-10-CM | POA: Diagnosis not present

## 2014-03-30 DIAGNOSIS — E785 Hyperlipidemia, unspecified: Secondary | ICD-10-CM | POA: Diagnosis present

## 2014-03-30 LAB — CBC
HCT: 30.9 % — ABNORMAL LOW (ref 36.0–46.0)
Hemoglobin: 10.4 g/dL — ABNORMAL LOW (ref 12.0–15.0)
MCH: 33.4 pg (ref 26.0–34.0)
MCHC: 33.7 g/dL (ref 30.0–36.0)
MCV: 99.4 fL (ref 78.0–100.0)
Platelets: 237 10*3/uL (ref 150–400)
RBC: 3.11 MIL/uL — ABNORMAL LOW (ref 3.87–5.11)
RDW: 13.5 % (ref 11.5–15.5)
WBC: 7 10*3/uL (ref 4.0–10.5)

## 2014-03-30 LAB — BASIC METABOLIC PANEL
BUN: 25 mg/dL — ABNORMAL HIGH (ref 6–23)
CO2: 21 mEq/L (ref 19–32)
Calcium: 9.3 mg/dL (ref 8.4–10.5)
Chloride: 105 mEq/L (ref 96–112)
Creatinine, Ser: 0.72 mg/dL (ref 0.50–1.10)
GFR calc Af Amer: >90 mL/min (ref >90)
GFR calc non Af Amer: 78 mL/min — ABNORMAL LOW (ref >90)
Glucose, Bld: 224 mg/dL — ABNORMAL HIGH (ref 70–99)
Potassium: 4.1 mEq/L (ref 3.7–5.3)
Sodium: 141 mEq/L (ref 137–147)

## 2014-03-30 LAB — GLUCOSE, CAPILLARY
Glucose-Capillary: 213 mg/dL — ABNORMAL HIGH (ref 70–99)
Glucose-Capillary: 264 mg/dL — ABNORMAL HIGH (ref 70–99)

## 2014-03-30 MED ORDER — POLYETHYLENE GLYCOL 3350 17 G PO PACK: 17.0000 g | PACK | Freq: Two times a day (BID) | ORAL | Status: DC

## 2014-03-30 MED ORDER — FUROSEMIDE 40 MG PO TABS: 40.0000 mg | ORAL_TABLET | Freq: Every day | ORAL | Status: DC

## 2014-03-30 MED ORDER — INSULIN GLARGINE 100 UNIT/ML ~~LOC~~ SOLN: 14.0000 [IU] | Freq: Every day | SUBCUTANEOUS | Status: DC

## 2014-03-30 MED ORDER — GLUCERNA SHAKE PO LIQD: 237.0000 mL | Freq: Three times a day (TID) | ORAL | Status: DC

## 2014-03-30 MED ORDER — HYDROCODONE-ACETAMINOPHEN 5-325 MG PO TABS: 1.0000 | ORAL_TABLET | Freq: Four times a day (QID) | ORAL | Status: DC | PRN

## 2014-03-30 MED ORDER — ALPRAZOLAM 0.25 MG PO TABS: 0.2500 mg | ORAL_TABLET | Freq: Two times a day (BID) | ORAL | Status: DC | PRN

## 2014-03-30 MED ORDER — DSS 100 MG PO CAPS: 100.0000 mg | ORAL_CAPSULE | Freq: Two times a day (BID) | ORAL | Status: DC

## 2014-03-30 NOTE — Discharge Planning (Addendum)
Pt being DCd to [facility].  Tele and IV DCd.  Wedge pillow in place and Report called to Nyra Capes, Agricultural consultant.  Pt will be pushed to [facility] when ready. - Rm 124

## 2014-03-30 NOTE — Telephone Encounter (Signed)
[place]

## 2014-03-30 NOTE — Discharge Summary (Signed)
  Orthopedic discharge summary  Diagnosis right hip fracture  Procedure right bipolar partial hip replacement  Operating surgeon Dr. Aline Brochure from number [phone] or to the hospital page operator [phone]  Weightbearing status as tolerated  Direct lateral hip precautions  DVT prevention use Plavix as the patient was previously on Plavix  Surgery date was Wednesday, May 13  Followup one month  Abduction pillow in place for 6 weeks  Staples on postop day 14  Implants [place] basic fracture stem

## 2014-03-30 NOTE — Discharge Summary (Signed)
Physician Discharge Summary  [name] GGY:[id number] DOB: May 10, 1931 DOA: 03/21/2014  PCP: [name], MD  Admit date: 03/21/2014 Discharge date: 03/30/2014  Time spent: 35 minutes  Recommendations for Outpatient Follow-up:  Needs to continue to work with PT.  Needs to follow up with Dr Aline Brochure post surgery.  Needs to follow up with Cardiologist.   Discharge Diagnoses:    Hip fracture, right   Acute encephalopayhy   DIABETES MELLITUS, TYPE II, UNCONTROLLED   Fall   UTI (lower urinary tract infection)   Hip fracture   Acute diastolic congestive heart failure   Preoperative cardiovascular examination   Fracture of femoral neck, right, closed   Discharge Condition: Stable.   Diet recommendation: Heart Healthy  Filed Weights   03/21/14 2120  Weight: 69.5 kg (153 lb 3.5 oz)    History of present illness:  80 year old female who has a past medical history of Hemorrhage of gastrointestinal tract, unspecified; Other malaise and fatigue; Tobacco use disorder; Parotid tumor; Neoplasm of malignant; Diabetes mellitus; Osteopenia; Hypertension; Hyperlipidemia; Depression; Anxiety state, unspecified; Chronic headaches; Carotid artery disease; and Orthostatic hypotension.  Was brought to the ED after patient fell in the bathroom. As per patient she went to the toilet and was about to clean herself when she lost her balance and fell. She denies loss of consciousness, no headache no chest pain. Patient says that after falling she had sudden onset of pain in the right hip. She also has some shortness of breath, patient was brought to the ED .  In the ED patient was found to be mildly hypoxic, with O2 sats 88% on room air . Chest x-ray showed pulmonary edema and vascular congestion , BNP elevated to 556. Last 2-D echocardiogram showed grade 1 diastolic dysfunction.  also patient found to have displaced right femoral neck fracture without dislocation. Orthopedics been consulted by ED physician.   She received a dose of Lasix 40 mg IV x1, and has diuresed about 800 cc of urine.  Patient also has been diagnosed with UTI recently, and has been taking Macrobid twice a day, despite taking Macrobid patient continued to have symptoms of dysuria and intermittent fevers. She has elevated white count of 15,000.   Hospital Course:   Right hip fracture.  Patient had taken Plavix prior to admission and therefore surgery was delayed. Patient S/P Arthroplasty bipolar of right hip on 5-13. Needs continue PT.  I will adjust pain medications to avoid oversedation. Increase frequency of bowel regimen. Received Lovenox for DVT prophylaxis during admission. Discussed with Dr Aline Brochure resume plavix.  will use plavix for DVT prophylaxis.    Acute diastolic congestive heart failure. Appears to have approached euvolemic. Lasix has been changed to by mouth. Follow renal function. Resume lisinopril at discharge. resume spironolactone.  Acute respiratory failure due to #2. Wean off oxygen as tolerated. Repeat chest xray shows worsening right lower lobe process, concerning for atelectasis, pneumonia or worsening edema. Since patient does not have fever or leukocytosis, will hold off on antibiotics. Encouraged incentive spirometry. Patient takes very shallow breaths and it is likely that atelectasis is playing a role. Monitor. Afebrile.   History of coronary disease. Ok to resume plavix.   Mild metabolic acidosis. Lactic acid was elevated. This has resolved.  Diabetes. On Lantus and sliding scale insulin.  Encephalopathy; toxic secondary to medication. Altered mental status. She does not appear to have any ongoing infectious process. This is likely related to pain medication as well as her underlying dementia. I  will discontinue IV morphine, robaxin. Will change Vicodin to 1 tablet. Improved, more interactive today. Avoid oversedation.  Acute blood loss anemia; post surgery expected. Hb stable at  10.  Procedures: Patient S/P Arthroplasty bipolar of right hip  Consultations:  Dr Aline Brochure.   Discharge Exam: Filed Vitals:   03/30/14 1309  BP: 114/69  Pulse: 82  Temp: 98.7 F (37.1 C)  Resp: 18    General: Alert in no distress.  Cardiovascular: S 1, S 2 RRR Respiratory: CTA  Discharge Instructions You were cared for by a hospitalist during your hospital stay. If you have any questions about your discharge medications or the care you received while you were in the hospital after you are discharged, you can call the unit and asked to speak with the hospitalist on call if the hospitalist that took care of you is not available. Once you are discharged, your primary care physician will handle any further medical issues. Please note that NO REFILLS for any discharge medications will be authorized once you are discharged, as it is imperative that you return to your primary care physician (or establish a relationship with a primary care physician if you do not have one) for your aftercare needs so that they can reassess your need for medications and monitor your lab values.      Discharge Instructions   Diet - low sodium heart healthy    Complete by:  As directed      Increase activity slowly    Complete by:  As directed             Medication List    STOP taking these medications       glyBURIDE-metformin 5-500 MG per tablet  Commonly known as:  GLUCOVANCE     nitrofurantoin (macrocrystal-monohydrate) 100 MG capsule  Commonly known as:  MACROBID      TAKE these medications       ALPRAZolam 0.25 MG tablet  Commonly known as:  XANAX  Take 1 tablet (0.25 mg total) by mouth 2 (two) times daily as needed for anxiety.     atorvastatin 10 MG tablet  Commonly known as:  LIPITOR  Take 1 tablet (10 mg total) by mouth daily.     CEREFOLIN NAC PO  Take 1 tablet by mouth daily.     cholecalciferol 1000 UNITS tablet  Commonly known as:  VITAMIN D  Take 1,000 Units by  mouth every morning.     clopidogrel 75 MG tablet  Commonly known as:  PLAVIX  Take 1 tablet (75 mg total) by mouth daily with breakfast.     DSS 100 MG Caps  Take 100 mg by mouth 2 (two) times daily.     esomeprazole 40 MG capsule  Commonly known as:  NEXIUM  Take 40 mg by mouth at bedtime.     feeding supplement (GLUCERNA SHAKE) Liqd  Take 237 mLs by mouth 3 (three) times daily between meals.     furosemide 40 MG tablet  Commonly known as:  LASIX  Take 1 tablet (40 mg total) by mouth daily.     HYDROcodone-acetaminophen 5-325 MG per tablet  Commonly known as:  NORCO/VICODIN  Take 1 tablet by mouth every 6 (six) hours as needed for moderate pain.     insulin aspart 100 UNIT/ML injection  Commonly known as:  novoLOG  Inject 3 Units into the skin daily after supper.     insulin glargine 100 UNIT/ML injection  Commonly known as:  LANTUS  Inject 0.14 mLs (14 Units total) into the skin at bedtime.     lisinopril 20 MG tablet  Commonly known as:  PRINIVIL,ZESTRIL  Take 1 tablet (20 mg total) by mouth daily.     metoprolol succinate 50 MG 24 hr tablet  Commonly known as:  TOPROL-XL  Take 1 tablet (50 mg total) by mouth daily. Take with or immediately following a meal.     mirtazapine 30 MG tablet  Commonly known as:  REMERON  Take 30 mg by mouth at bedtime.     polyethylene glycol packet  Commonly known as:  MIRALAX / GLYCOLAX  Take 17 g by mouth 2 (two) times daily.     spironolactone 25 MG tablet  Commonly known as:  ALDACTONE  Take 25 mg by mouth daily.     tiotropium 18 MCG inhalation capsule  Commonly known as:  SPIRIVA  Place 18 mcg into inhaler and inhale daily as needed (shortness of breath).       Allergies  Allergen Reactions  . Aricept [Donepezil Hcl]     Hives, vomiting and headache  . Namenda [Memantine Hcl]     hives  . Codeine   . Latex   . Penicillins Rash   Follow-up Information   Follow up with [name], MD In 2 weeks.   Specialty:   Internal Medicine   Contact information:   [address] [place] [address] [phone]       Follow up with Arther Abbott, MD In 2 weeks.   Specialties:  Orthopedic Surgery, Radiology   Contact information:   [address], [place], [place] [address] [phone]        The results of significant diagnostics from this hospitalization (including imaging, microbiology, ancillary and laboratory) are listed below for reference.    Significant Diagnostic Studies: Dg Hip Complete Right  03/21/2014   CLINICAL DATA:  RIGHT hip pain post fall earlier today  EXAM: RIGHT HIP - COMPLETE 2+ VIEW  COMPARISON:  None  FINDINGS: Osseous demineralization.  Minimal narrowing of the joints bilaterally.  Displaced fracture of the RIGHT femoral neck.  No dislocation.  SI joints symmetric.  No other focal bony abnormalities identified.  IMPRESSION: Displaced RIGHT femoral neck fracture without dislocation.   Electronically Signed   By: Lavonia Dana M.D.   On: 03/21/2014 16:31   Dg Pelvis Portable  03/28/2014   CLINICAL DATA:  Postop right hip arthroplasty  EXAM: PORTABLE PELVIS 1-2 VIEWS  COMPARISON:  03/21/2014  FINDINGS: Right hip arthroplasty changes noted. Alignment appears anatomic in the frontal plane. No hardware or osseous abnormality. Visualized pelvis intact. Minor degenerative changes of the left hip.  IMPRESSION: Status post right hip arthroplasty.  No complicating feature.   Electronically Signed   By: Daryll Brod M.D.   On: 03/28/2014 20:05   Dg Chest Port 1 View  03/24/2014   CLINICAL DATA:  Cough and congestion.  EXAM: PORTABLE CHEST - 1 VIEW  COMPARISON:  Single view of the chest 03/21/2014 and 02/16/2014.  FINDINGS: Right worse than left airspace disease persists. Aeration in the right lung base has worsened. Heart size is upper normal. No pneumothorax identified. There is likely a small right pleural effusion.  IMPRESSION: Persistent  pulmonary edema. Worsened aeration in the right base could be due to atelectasis, more intense edema or pneumonia.   Electronically Signed   By: Inge Rise M.D.   On: 03/24/2014 17:09  Dg Chest Port 1 View  03/21/2014   CLINICAL DATA:  [place].  Hip fracture.  EXAM: PORTABLE CHEST - 1 VIEW  COMPARISON:  Chest x-ray 02/16/2014  FINDINGS: The heart is within normal limits and stable. There is tortuosity and calcification of the thoracic aorta. There is significant vascular congestion and probable interstitial pulmonary edema. No definite pleural effusions or pneumothorax. The bony thorax is intact.  IMPRESSION: Vascular congestion and pulmonary edema.   Electronically Signed   By: Kalman Jewels M.D.   On: 03/21/2014 17:54   Dg Shoulder Left Port  03/25/2014   CLINICAL DATA:  Recent injury with pain  EXAM: PORTABLE LEFT SHOULDER - 2+ VIEW  COMPARISON:  None.  FINDINGS: There is no evidence of fracture or dislocation. There is no evidence of arthropathy or other focal bone abnormality. Soft tissues are unremarkable.  IMPRESSION: No acute abnormality noted.   Electronically Signed   By: Inez Catalina M.D.   On: 03/25/2014 20:27    Microbiology: Recent Results (from the past 240 hour(s))  URINE CULTURE     Status: None   Collection Time    03/21/14  5:35 PM      Result Value Ref Range Status   Specimen Description URINE, CATHETERIZED   Final   Special Requests NONE   Final   Culture  Setup Time     Final   Value: 03/21/2014 22:00     Performed at [facility] Count     Final   Value: NO GROWTH     Performed at [facility]   Culture     Final   Value: NO GROWTH     Performed at [facility]   Report Status 03/23/2014 FINAL   Final  SURGICAL PCR SCREEN     Status: None   Collection Time    03/28/14  5:19 AM      Result Value Ref Range Status   MRSA, PCR NEGATIVE  NEGATIVE Final   Staphylococcus aureus NEGATIVE  NEGATIVE Final   Comment:            The  Xpert SA Assay (FDA     approved for NASAL specimens     in patients over 40 years of age),     is one component of     a comprehensive surveillance     program.  Test performance has     been validated by [employer] for patients greater     than or equal to 49 year old.     It is not intended     to diagnose infection nor to     guide or monitor treatment.     Labs: Basic Metabolic Panel:  Recent Labs Lab 03/26/14 0635 03/27/14 0927 03/28/14 1116 03/29/14 0600 03/30/14 0521  NA 141 134* 137 138 141  K 3.7 3.5* 4.2 4.1 4.1  CL 100 94* 100 103 105  CO2 _0 GLUCOSE 313* 297* 246* 198* 224*  BUN 41* 36* 37* 30* 25*  CREATININE 0.88 0.71 0.73 0.63 0.72  CALCIUM 9.6 9.8 10.0 9.0 9.3   Liver Function Tests: No results found for this basename: AST, ALT, ALKPHOS, BILITOT, PROT, ALBUMIN,  in the last 168 hours No results found for this basename: LIPASE, AMYLASE,  in the last 168 hours No results found for this basename: AMMONIA,  in the last 168 hours CBC:  Recent Labs Lab  03/25/14 0605 03/26/14 0635 03/29/14 0600 03/30/14 0521  WBC 7.2 6.7 6.3 7.0  HGB 12.6 12.8 10.8* 10.4*  HCT 37.0 37.6 31.8* 30.9*  MCV 100.5* 101.1* 99.7 99.4  PLT 235 238 219 237   Cardiac Enzymes: No results found for this basename: CKTOTAL, CKMB, CKMBINDEX, TROPONINI,  in the last 168 hours BNP: BNP (last 3 results)  Recent Labs  06/24/13 0513 02/18/14 0724 03/21/14 1741  PROBNP 768.3* 285.7 556.1*   CBG:  Recent Labs Lab 03/29/14 1134 03/29/14 1648 03/29/14 2328 03/30/14 0727 03/30/14 1118  GLUCAP 240* 248* 161* 213* 264*       Signed:  Samiah Ricklefs A Kori Goins  [facility] 03/30/2014, 1:21 PM

## 2014-03-30 NOTE — Progress Notes (Addendum)
Physical Therapy Treatment Patient Details Name: BLAIKE NEWBURN MRN: [id number] DOB: August 04, 1931 Today's Date: 2014/03/31    History of Present Illness 80 year old female who   has a past medical history of Hemorrhage of gastrointestinal tract, unspecified; Other malaise and fatigue; Tobacco use disorder; Parotid tumor; Neoplasm of malignant; Diabetes mellitus; Osteopenia; Hypertension; Hyperlipidemia; Depression; Anxiety state, unspecified; Chronic headaches; Carotid artery disease; and Orthostatic hypotension.Pt fell at home sustaining a right hip Fx. Pt s/p R hip hemiarthroplasty.    PT Comments    PT appeared to do better with bed mobility but still is not assisting with any activity.   Follow Up Recommendations  SNF     Equipment Recommendations  None recommended by PT       Precautions / Restrictions Precautions Precautions: Fall Restrictions Weight Bearing Restrictions: Yes RLE Weight Bearing: Weight bearing as tolerated          Cognition Arousal/Alertness: Awake/alert Behavior During Therapy: WFL for tasks assessed/performed Overall Cognitive Status: History of cognitive impairments - at baseline                      Exercises Total Joint Exercises Ankle Circles/Pumps: PROM;Both;10 reps Heel Slides: PROM;5 reps        Pertinent Vitals/Pain Pt did not verbalize pain but moaned with motion to LE           PT Goals (current goals can now be found in the care plan section)      Frequency  Min 5X/week    PT Plan  SNF       End of Session Equipment Utilized During Treatment: Oxygen   Patient left: in bed;with call bell/phone within reach;with family/visitor present     Time: 7482-7078 PT Time Calculation (min): 21 min  Charges:  $Therapeutic Activity: 8-22 mins                    G Codes:      Leeroy Cha 31-Mar-2014, 10:14 AM

## 2014-03-30 NOTE — Progress Notes (Signed)
Inpatient Diabetes Program Recommendations  AACE/ADA: New Consensus Statement on Inpatient Glycemic Control (2013)  Target Ranges:  Prepandial:   less than 140 mg/dL      Peak postprandial:   less than 180 mg/dL (1-2 hours)      Critically ill patients:  140 - 180 mg/dL   Results for BERONICA, LANSDALE (MRN [id number]) as of 03/30/2014 08:54  Ref. Range 03/29/2014 07:39 03/29/2014 11:34 03/29/2014 16:48 03/29/2014 23:28 03/30/2014 07:27  Glucose-Capillary Latest Range: 70-99 mg/dL 183 (H) 240 (H) 248 (H) 161 (H) 213 (H)   Diabetes history: DM2  Outpatient Diabetes medications: Lantus 3-8 units depending on CBG, Novolog 3 units with supper, Glucovance 5-500 mg BID  Current orders for Inpatient glycemic control: Lantus 14 units QHS, Novolog 0-15 units AC, Novolog 0-5 units HS  Inpatient Diabetes Program Recommendations Insulin - Basal: Please consider increasing Lantus to 15 units QHS. Insulin - Meal Coverage: Please consider ordering Novolog 4 units TID with meals if patient is eating at least 50% of meals.  Thanks, Barnie Alderman, RN, MSN, CCRN Diabetes Coordinator Inpatient Diabetes Program [phone] (Team Pager) [phone] ([facility] office) [phone] [facility] office)

## 2014-03-30 NOTE — Clinical Social Work Placement (Signed)
Clinical Social Work Department CLINICAL SOCIAL WORK PLACEMENT NOTE 03/30/2014  Patient:  [name], [name]  Account Number:  [id number] Admit date:  03/21/2014  Clinical Social Worker:  Daiva Huge  Date/time:  03/27/2014 03:57 PM  Clinical Social Work is seeking post-discharge placement for this patient at the following level of care:   SKILLED NURSING   (*CSW will update this form in Epic as items are completed)   03/27/2014  Patient/family provided with [place] Department of Clinical Social Works list of facilities offering this level of care within the geographic area requested by the patient (or if unable, by the patients family).  03/27/2014  Patient/family informed of their freedom to choose among providers that offer the needed level of care, that participate in Medicare, Medicaid or managed care program needed by the patient, have an available bed and are willing to accept the patient.  03/27/2014  Patient/family informed of [facility] ownership interest in [facility], as well as of the fact that they are under no obligation to receive care at this facility.  PASARR submitted to EDS on 03/27/2014 PASARR number received from EDS on 03/27/2014  FL2 transmitted to all facilities in geographic area requested by pt/family on  03/27/2014 FL2 transmitted to all facilities within larger geographic area on   Patient informed that his/her managed care company has contracts with or will negotiate with  certain facilities, including the following:   NA     Patient/family informed of bed offers received:  03/30/2014 Patient chooses bed at [facility] Physician recommends and patient chooses bed at    Patient to be transferred to [facility] on  03/30/2014 Patient to be transferred to facility by Ambulance  [name]  The following physician request were entered in Epic:   Additional Comments: 03/30/14  [place] per MD for d/c today to SNF.  Ok per patient and her son [name] who was in patient's room. He stated that he will contact his sisters regarding the discharge. Patient is agreeable to go to rehab as is her son.  Nursing notified to call report.  CSW signing off.  Lorie Phenix. [place], [place]

## 2014-03-30 NOTE — Progress Notes (Signed)
Patient ID: [name], female   DOB: [birth year], 80 y.o.   MRN: [id number]  HIP FRACTURE INSTRUCTIONS: TAKE STAPLES OUT POD 12 APPLY STERI STRIPS WEIGHT BEARING AS TOLERATED WITH A WALKER X 6 WEEKS  plavix for dvt  HIP PRECAUTIONS: direct lateral  Follow up in a month (or sooner if any issues arrive)

## 2014-03-30 NOTE — Evaluation (Signed)
Occupational Therapy Evaluation Patient Details Name: [name] MRN: [id number] DOB: 1931-10-27 Today's Date: 03/30/2014    History of Present Illness 80 year old female who   has a past medical history of Hemorrhage of gastrointestinal tract, unspecified; Other malaise and fatigue; Tobacco use disorder; Parotid tumor; Neoplasm of malignant; Diabetes mellitus; Osteopenia; Hypertension; Hyperlipidemia; Depression; Anxiety state, unspecified; Chronic headaches; Carotid artery disease; and Orthostatic hypotension.Pt fell at home sustaining a right hip Fx. Pt s/p R hip hemiarthroplasty.   Clinical Impression   Pt is presenting to acute OT with above situation.  She is generally weak and still very confused, but is more verbal than yesterday.  In speaking with daughter, family's preference if for pt to go to SNF to gain further strength.  Pt will benefit from further OT services for strengthening, improving ADL status, and family education.  Recommend SNF OT at this time.    Follow Up Recommendations  SNF    Equipment Recommendations   (Defer to SNF)    Recommendations for Other Services       Precautions / Restrictions Precautions Precautions: Fall Restrictions Weight Bearing Restrictions: Yes RLE Weight Bearing: Weight bearing as tolerated      Mobility Bed Mobility Overal bed mobility: Needs Assistance Bed Mobility:  (to sidelying, max assist)      Transfers                      Balance                                            ADL Overall ADL's : Needs assistance/impaired Eating/Feeding: Moderate assistance;Bed level Eating/Feeding Details (indicate cue type and reason): Hand over hand assist to scoop food into spoon and bring spoon towards mouth.  Pt would turn face and open mouth to accept food. Grooming: Wash/dry face;Maximal assistance Grooming Details (indicate cue type and reason): Pt able to hold washcloth in her hand, but required  assist to to cloth to face or move cloth across face.                               General ADL Comments: Daughter reports that pt's cognitivon and ability to follow commands/iniate steps has been significanly more impaired since being in the hopsital, and beleives it is due to medication.     Vision                     Perception     Praxis      Pertinent Vitals/Pain      Hand Dominance Right   Extremity/Trunk Assessment Upper Extremity Assessment Upper Extremity Assessment: Generalized weakness;Difficult to assess due to impaired cognition           Communication Communication Communication: Expressive difficulties (Pt verbal today, but had minimal communiation during session.)   Cognition Arousal/Alertness: Awake/alert Behavior During Therapy: WFL for tasks assessed/performed Overall Cognitive Status: History of cognitive impairments - at baseline                     General Comments       Exercises       Shoulder Instructions      Home Living Family/patient expects to be discharged to:: Skilled nursing facility Living Arrangements: Children Available Help at Discharge: Family Type  of Home: House Home Access: Ramped entrance                     Home Equipment: Shower seat;Walker - 2 wheels;Grab bars - tub/shower;Hand held shower head;Bedside commode   Additional Comments: pt mainly in w/c at home and just started ambulating a few steps with HHPT.      Prior Functioning/Environment Level of Independence: Needs assistance    ADL's / Homemaking Assistance Needed: Pt's son and daughter provide assist with IADLs.  Pt was able to feed self prior to admission an dpreofmr grooming tasks.        OT Diagnosis: Generalized weakness   OT Problem List: Decreased strength;Decreased cognition;Decreased activity tolerance   OT Treatment/Interventions: Self-care/ADL training;Therapeutic exercise;Energy  conservation;Patient/family education    OT Goals(Current goals can be found in the care plan section) Acute Rehab OT Goals Patient Stated Goal: none stated OT Goal Formulation: With patient/family Time For Goal Achievement: 04/13/14 Potential to Achieve Goals: Fair ADL Goals Pt Will Perform Eating: with set-up Pt/caregiver will Perform Home Exercise Program: Increased ROM;Increased strength;Both right and left upper extremity  OT Frequency: Min 2X/week   Barriers to D/C:            Co-evaluation              End of Session    Activity Tolerance: Patient tolerated treatment well Patient left: in bed;with family/visitor present;with call bell/phone within reach;with bed alarm set   Time: 9483-4758 OT Time Calculation (min): 32 min Charges:  OT General Charges $OT Visit: 1 Procedure OT Evaluation $Initial OT Evaluation Tier I: 1 Procedure OT Treatments $Self Care/Home Management : 8-22 mins G-Codes:     Bea Graff, [place], OTR/L [phone]  03/30/2014, 12:27 PM

## 2014-04-01 LAB — TYPE AND SCREEN
ABO/RH(D): O POS
Antibody Screen: NEGATIVE
Unit division: 0
Unit division: 0

## 2014-04-02 LAB — GLUCOSE, CAPILLARY
Glucose-Capillary: 212 mg/dL — ABNORMAL HIGH (ref 70–99)
Glucose-Capillary: 249 mg/dL — ABNORMAL HIGH (ref 70–99)
Glucose-Capillary: 218 mg/dL — ABNORMAL HIGH (ref 70–99)
Glucose-Capillary: 402 mg/dL — ABNORMAL HIGH (ref 70–99)
Glucose-Capillary: 412 mg/dL — ABNORMAL HIGH (ref 70–99)
Glucose-Capillary: 379 mg/dL — ABNORMAL HIGH (ref 70–99)
Glucose-Capillary: 238 mg/dL — ABNORMAL HIGH (ref 70–99)
Glucose-Capillary: 247 mg/dL — ABNORMAL HIGH (ref 70–99)
Glucose-Capillary: 330 mg/dL — ABNORMAL HIGH (ref 70–99)
Glucose-Capillary: 288 mg/dL — ABNORMAL HIGH (ref 70–99)
Glucose-Capillary: 222 mg/dL — ABNORMAL HIGH (ref 70–99)
Glucose-Capillary: 355 mg/dL — ABNORMAL HIGH (ref 70–99)

## 2014-04-03 ENCOUNTER — Encounter: Payer: Self-pay | Admitting: Internal Medicine

## 2014-04-03 ENCOUNTER — Non-Acute Institutional Stay (SKILLED_NURSING_FACILITY): Payer: Medicare Other | Admitting: Internal Medicine

## 2014-04-03 DIAGNOSIS — IMO0001 Reserved for inherently not codable concepts without codable children: Secondary | ICD-10-CM

## 2014-04-03 DIAGNOSIS — E1165 Type 2 diabetes mellitus with hyperglycemia: Secondary | ICD-10-CM

## 2014-04-03 DIAGNOSIS — I2510 Atherosclerotic heart disease of native coronary artery without angina pectoris: Secondary | ICD-10-CM | POA: Diagnosis not present

## 2014-04-03 DIAGNOSIS — S72009A Fracture of unspecified part of neck of unspecified femur, initial encounter for closed fracture: Secondary | ICD-10-CM | POA: Diagnosis not present

## 2014-04-03 DIAGNOSIS — I509 Heart failure, unspecified: Secondary | ICD-10-CM

## 2014-04-03 DIAGNOSIS — I5031 Acute diastolic (congestive) heart failure: Secondary | ICD-10-CM

## 2014-04-03 DIAGNOSIS — S72001A Fracture of unspecified part of neck of right femur, initial encounter for closed fracture: Secondary | ICD-10-CM

## 2014-04-03 LAB — GLUCOSE, CAPILLARY
Glucose-Capillary: 391 mg/dL — ABNORMAL HIGH (ref 70–99)
Glucose-Capillary: 326 mg/dL — ABNORMAL HIGH (ref 70–99)
Glucose-Capillary: 277 mg/dL — ABNORMAL HIGH (ref 70–99)
Glucose-Capillary: 395 mg/dL — ABNORMAL HIGH (ref 70–99)
Glucose-Capillary: 399 mg/dL — ABNORMAL HIGH (ref 70–99)
Glucose-Capillary: 365 mg/dL — ABNORMAL HIGH (ref 70–99)
Glucose-Capillary: 244 mg/dL — ABNORMAL HIGH (ref 70–99)

## 2014-04-03 NOTE — Progress Notes (Signed)
Patient ID: [name], female   DOB: 10-29-1931, 80 y.o.   MRN: [id number]   This is an acute visit.  Level of care skilled.  Facility [facility].  Chief complaint-acute visit status post hospitalization for right hip fracture with repair as well as acute diastolic CHF.  History of present illness.  Patient is a pleasant 80 year old female was brought to the ED after she fell in the bathroom.  In the ED she was also found to be mildly hypoxic chest x-ray showed pulmonary edema and vascular congestion with an elevated BNP of 556.  2-D echo most recently showed grade 1 diastolic dysfunction.  She was also found to have a displaced right femoral neck fracture and subsequently did undergo a repair.  In regards to CHF she did receive a dose of Lasix IV.--Repeat Ardyth Gal ray shows worsening right lower lobe process concerning for atelectasis pneumonia or edema-it was felt the patient most likely was having atelectasis and she did not have a fever or leukocytosis spirometry was encouraged  She is now taking 40 mg a day by mouth as well as spironolactone which apparently she was on previously.  In regards to hip fracture surgery was delayed initially because she had been on Plavix-apparently she tolerated the operation without significant complication  She also is a type II diabetic apparently had been on Glucophage before admission however this was DC'd I I suspect secondary to apparently mild metabolic acidosis with elevated lactic acid which apparently resolved.  She continues on Lantus.  Her blood sugars appear to have an elevated so far the facility morning sugars ranging from 118-277 this morning.  Later in the day her sugars often are in the 300 range and at night more so in the higher 200 range average.  It appears her Lantus was increased to 18 units 2 nights ago.  We have given her NovoLog coverage here middays secondary to an elevated blood sugar greater than 300-she does have an  order for NovoLog 3 units at 6 PM as well.  Currently she has no complaints of shortness of breath fever chills-vital signs appear stable.  Previous medical history.  History of right hip fracture with repair.  Diastolic CHF.  History of acute encephalopathy were solved.  Diabetes type 2 uncontrolled in the past.  History of falls.  History UTI.  History GI bleed.  Parotid tumor.  Carotid artery disease.  Coronary artery disease.  Breast cancer status post mastectomy.  PSVT tachycardia.  Previous surgeries.  History of laparoscopic appendectomy-mastectomy-fistula closure    .  Medications have been reviewed per MAR.  They include.  Xanax 0.25 mg twice a day when necessary.  Lipitor 10 mg daily. cerefolin 1 tablet daily.  Vitamin D 1000 units every morning.  Plavix 75 mg daily.  Nexium 40 mg each bedtime.  Lasix 40 mg daily.  Vicodin 5-325 mg every 6 hours when necessary moderate pain.  NovoLog 3 units subcutaneous after supper.  Lantus 18 units each bedtime.  Lisinopril 20 mg daily.  Lopressor ER 50 mg daily.  MiraLax twice a day.  Remeron 30 mg daily.  Spiriva daily when necessary  Social history-limited information patient somewhat poor historian apparently quit smoking about a year ago---no history of significant alcohol or drug use she apparently is single  Family history significant for heart failure and kidney cancer mother.  Father with a history of lung cancer.  Sr. is well with a history of kidney cancer and a brother with a history  of cirrhosis.  Review of systems.  Somewhat limited since patient appears to be somewhat poor historian.  In general does not complain of fever or chills.  Skin is not complaining of itching or rashes.  Head ears eyes nose mouth and throat-no complaints of sore throat or visual changes.  Respiratory no complaints of shortness of breath.  Cardiac does not complaining of chest pain again  does have a history of diastolic CHF fairly minimal edema noted.  GU-was treated for UTI in the hospital.  Does not complain of dysuria currently.  GI-is not complaining of any nausea vomiting diarrhea constipation or abdominal pain.  Muscle skeletal recent right hip fracture with repair she is having some discomfort apparently pain medication is helping.  Neurologic is not complaining of numbness dizziness or syncopal-type feelings.  Psych she does apparently have some history of cognitive issues is pleasant and appropriate however.  Physical exam.  T-98.0 pulse 82 respirations 20 blood pressure 126/60.--O2 saturation-98%  In general this is a pleasant elderly female in no distress resting comfortably in bed.  Her skin is warm and dry past surgical site right hip Staples are in place there is minimal exudate here there is some postop bruising but no significant erythema concerning  of cellulitis or acute tenderness to the site  Oropharynx is clear mucous membranes are moist.  Eyes appear reactive to light sclera and conjunctiva clear visual acuity appears grossly intact.  Her chest is clear to auscultation without labored breathing.  Heart is regular rate and rhythm with minimal lower extremity edema she has positive pulses pedal he.  Abdomen soft nontender with positive bowel sounds.  Muscle skeletal has good strength bilaterally he is able to move her left leg although limited exam secondary to being in bed--she has a spacer in place between her legs- surgical site again on right hip appears unremarkable with minimal lower extremity edema either extremity.  Neurologic appears grossly intact cranial nerves appear grossly intact.  Psych she is oriented to self is pleasant and appropriate.  Labs.  03/30/2014.  Sodium 141 potassium 4.1 UA 25 creatinine 0.72.  WBC 7.0 hemoglobin 10.4 platelets 237.  03/21/2014.  Liver function tests within normal limits except alkaline  phosphatase 152.  Assessment and plan.  #1-history of right hip fracture-at this point appears to be stable with  will be orthopedic followup apparently her pain medications were decreased secondary to concerns of encephalopathy in the hospital her morphine Robaxin was changed the hospital to Vicodin and this appears to have helped. She is on Plavix for anticoagulation per orthopedics.  #1--UUVOZDG of diastolic CHF-will write orders for weights 2 times a week notify provider gain greater than 3 pounds-she is on Lasix--as well as spironolactone Will update metabolic panel tomorrow as well.  #3 history of repiratoryt failure felt secondary to CHF-at this point she appears stable with no notable chest congestion or shortness of breath O2 sats are satisfactory.  #4-history coronary artery disease at this point appears clinically stable she is on Plavix.  #5  history of diabetes type 2 blood sugars are rather high Lantus has just been increased-she has received NovoLog earlier today she has a standing order for NovoLog at 6 PM-o call provider if CBGs greater than 300 this evening although it appears her blood sugar does go down somewhat overnight  I suspect we may have to increase her Lantus further although will monitor for now since Lantus was just increased  Her family had been asking about restarting  Glucophage however it appears she did have metabolic acidosis with elevated lactic acid I suspect Glucophage would not be a good medication-it appears she has been possibly on glyburide in the past again we'll defer to Dr. Dellia Nims.  Dr. Dellia Nims will followup tomorrow a.m.  #6-history of encephalopathy this was thought secondary to medication again changes have been made as noted above.  #7-history of GERD she is on Prilosec  #8 hypertension at this point we have fairly minimal readings continue to monitor she is on Toprol as well as lisinopril  #9-history of anemia-Will update CBC  tomorrow.  #10 it appears she has had some history of elevated liver function tests in the past although apparently this has been stable we'll update her liver function tests as well note on May 6 or function tests were fairly unremarkable except alkaline phosphatase mildly elevated at 152  11-hyperlipidemia she does continue on low dose statin.  [id number] note greater than 35 minutes spent assessing patient  and coordinating and formulating a plan of care for numerous diagnoses-of note greater than 50% of time spent coordinating plan of care      .

## 2014-04-04 ENCOUNTER — Non-Acute Institutional Stay (SKILLED_NURSING_FACILITY): Payer: Medicare Other | Admitting: Internal Medicine

## 2014-04-04 DIAGNOSIS — I5031 Acute diastolic (congestive) heart failure: Secondary | ICD-10-CM | POA: Diagnosis not present

## 2014-04-04 DIAGNOSIS — S72009A Fracture of unspecified part of neck of unspecified femur, initial encounter for closed fracture: Secondary | ICD-10-CM | POA: Diagnosis not present

## 2014-04-04 DIAGNOSIS — S72001A Fracture of unspecified part of neck of right femur, initial encounter for closed fracture: Secondary | ICD-10-CM

## 2014-04-04 DIAGNOSIS — R404 Transient alteration of awareness: Secondary | ICD-10-CM | POA: Diagnosis not present

## 2014-04-04 DIAGNOSIS — E1149 Type 2 diabetes mellitus with other diabetic neurological complication: Secondary | ICD-10-CM | POA: Diagnosis not present

## 2014-04-04 DIAGNOSIS — I509 Heart failure, unspecified: Secondary | ICD-10-CM

## 2014-04-04 LAB — GLUCOSE, CAPILLARY
Glucose-Capillary: 194 mg/dL — ABNORMAL HIGH (ref 70–99)
Glucose-Capillary: 296 mg/dL — ABNORMAL HIGH (ref 70–99)
Glucose-Capillary: 208 mg/dL — ABNORMAL HIGH (ref 70–99)
Glucose-Capillary: 336 mg/dL — ABNORMAL HIGH (ref 70–99)
Glucose-Capillary: 336 mg/dL — ABNORMAL HIGH (ref 70–99)
Glucose-Capillary: 277 mg/dL — ABNORMAL HIGH (ref 70–99)
Glucose-Capillary: 151 mg/dL — ABNORMAL HIGH (ref 70–99)

## 2014-04-05 ENCOUNTER — Other Ambulatory Visit: Payer: Self-pay | Admitting: *Deleted

## 2014-04-05 LAB — GLUCOSE, CAPILLARY
Glucose-Capillary: 130 mg/dL — ABNORMAL HIGH (ref 70–99)
Glucose-Capillary: 284 mg/dL — ABNORMAL HIGH (ref 70–99)
Glucose-Capillary: 253 mg/dL — ABNORMAL HIGH (ref 70–99)
Glucose-Capillary: 176 mg/dL — ABNORMAL HIGH (ref 70–99)

## 2014-04-05 MED ORDER — HYDROCODONE-ACETAMINOPHEN 5-325 MG PO TABS: ORAL_TABLET | ORAL | Status: DC

## 2014-04-05 NOTE — Telephone Encounter (Signed)
[facility] 

## 2014-04-06 ENCOUNTER — Encounter (HOSPITAL_COMMUNITY): Payer: Self-pay | Admitting: Orthopedic Surgery

## 2014-04-06 LAB — GLUCOSE, CAPILLARY
Glucose-Capillary: 108 mg/dL — ABNORMAL HIGH (ref 70–99)
Glucose-Capillary: 231 mg/dL — ABNORMAL HIGH (ref 70–99)
Glucose-Capillary: 149 mg/dL — ABNORMAL HIGH (ref 70–99)
Glucose-Capillary: 135 mg/dL — ABNORMAL HIGH (ref 70–99)

## 2014-04-07 LAB — GLUCOSE, CAPILLARY
Glucose-Capillary: 101 mg/dL — ABNORMAL HIGH (ref 70–99)
Glucose-Capillary: 224 mg/dL — ABNORMAL HIGH (ref 70–99)
Glucose-Capillary: 220 mg/dL — ABNORMAL HIGH (ref 70–99)
Glucose-Capillary: 144 mg/dL — ABNORMAL HIGH (ref 70–99)

## 2014-04-09 LAB — GLUCOSE, CAPILLARY
Glucose-Capillary: 101 mg/dL — ABNORMAL HIGH (ref 70–99)
Glucose-Capillary: 236 mg/dL — ABNORMAL HIGH (ref 70–99)
Glucose-Capillary: 133 mg/dL — ABNORMAL HIGH (ref 70–99)
Glucose-Capillary: 85 mg/dL (ref 70–99)
Glucose-Capillary: 248 mg/dL — ABNORMAL HIGH (ref 70–99)
Glucose-Capillary: 216 mg/dL — ABNORMAL HIGH (ref 70–99)

## 2014-04-10 ENCOUNTER — Telehealth: Payer: Self-pay

## 2014-04-10 LAB — GLUCOSE, CAPILLARY
Glucose-Capillary: 184 mg/dL — ABNORMAL HIGH (ref 70–99)
Glucose-Capillary: 275 mg/dL — ABNORMAL HIGH (ref 70–99)
Glucose-Capillary: 275 mg/dL — ABNORMAL HIGH (ref 70–99)

## 2014-04-10 NOTE — Progress Notes (Signed)
Patient ID: [name], female   DOB: Apr 21, 1931, 80 y.o.   MRN: [id number]                  HISTORY & PHYSICAL  DATE:  04/04/2014    FACILITY: [facility]    LEVEL OF CARE:   SNF   CHIEF COMPLAINT:  Admission to SNF, post stay at [facility], 03/21/2014 through 03/30/2014.    HISTORY OF PRESENT ILLNESS:  This is an 80 year-old woman who was essentially nonambulatory at home.  She was brought into the emergency room after she fell  apparently in the bathroom.   She suffered a right femoral neck fracture.  She underwent a right hip arthroplasty.  She received Lovenox for DVT prophylaxis.     Postoperatively, she developed acute diastolic heart failure.  She received IV Lasix, changed to p.o. Lasix.  She resumed her lisinopril and spironolactone at discharge.    She was felt to be in acute  respiratory failure, discharged on oxygen.  Chest x-ray showed right lower lobe process concerning for atelectasis/pneumonia/worsening edema.  She was not given antibiotics.  She was advised to follow up on this radiographically.    She was also felt to have a postoperative delirium.  IV morphine and Robaxin were discontinued.    In discussing things with her daughter, who is present, she  apparently lived at home with the daughter and her brother.  She sounds very dependent, was mostly wheelchair-bound, has not ambulated since last fall.  The daughter actually describes that she had an appendectomy and prolonged ileus last June.  Since then, she has not really been independent and things have gone down, both cognitively and emotionally.  She does have a history of what sounds to be major depression.    PAST MEDICAL HISTORY/PROBLEM LIST:    Right hip fracture.    Postoperative delirium.    Type 2 diabetes.  On insulin at home.    Urinary tract infection.    Acute-on-chronic diastolic heart failure.    Chronic nonambulatory status at home.    Sounding as though she had a long  history of depression, although she has not been compliant with antidepressive therapy.    Longstanding anorexia.  On Remeron for appetite stimulant and depression.  She was  apparently also on Megace since last fall.  According to her daughter, she will not eat without her Megace.    CURRENT MEDICATIONS:  Discharge medications include:      Xanax 0.25 b.i.d. p.r.n.     Lipitor 10 q.d.     Cerefolin 1 tablet by mouth daily.    Vitamin D 5000 U daily.    Plavix 75 q.d.    Colace 100 b.i.d.    Nexium 40 q.d.    Glucerna three times daily.    Lasix 40 q.d.    Vicodin 5/325, 1 tablet q.6 hours p.r.n.    NovoLog 3 U daily after supper.    Lantus 14 U at bedtime.    Lisinopril 20 q.d.    Metoprolol XL 50 q.d.    Remeron 30 at bedtime.    MiraLAX 17 g b.i.d.    Spironolactone 25 q.d.    Spiriva 18 q.d.    REVIEW OF SYSTEMS:   CHEST/RESPIRATORY:  Probable COPD.  She quit smoking last year.  Has not been on oxygen, however.   CARDIAC:   No clear chest pain.    GI:  Sounds as though she has  chronic constipation, although she had a recent large bowel movement. GU:  She was not incontinent at home.   NEUROLOGICAL:   I am not really certain, but it sounds as though this lady has been off her legs since last fall.  Exactly why, I am unclear.  The daughter states that [place] told her that she had weakness on the right side.  I will need to see if she has had neuroimaging.   ENDOCRINE:  Blood sugars are not under control here.  Yesterday:  277, 395, 365, 296, and 208 this morning.    PHYSICAL EXAMINATION:   VITAL SIGNS:   O2 SATURATIONS:  97% on 2 L.   PULSE:  96.   RESPIRATIONS:  20.   GENERAL APPEARANCE:  This is a frail, elderly woman who appears to have trouble even sitting up straight, leans to the right.   CHEST/RESPIRATORY:  Very shallow air entry bilaterally, probably compatible with advanced emphysema.   CARDIOVASCULAR:  CARDIAC:   Heart sounds are  normal.  There is a midsystolic ejection murmur, but no significant signs of heart failure.   GASTROINTESTINAL:  ABDOMEN:   Distended, but soft.  There are no masses.  No tenderness.   GENITOURINARY:  BLADDER:   Not overtly distended.   MUSCULOSKELETAL:   EXTREMITIES:   RIGHT LOWER EXTREMITY:  I did not get to see her incision.   CIRCULATION:   EDEMA/VARICOSITIES:  Extremities:  She has some edema of her lower legs.   ARTERIAL:   I suspect she also has significant PAD.  There is livedo reticularis in the upper left leg at least.   NEUROLOGICAL:   Difficult to tell.   SENSATION/STRENGTH:  She has antigravity strength in her arms.  She can move her left leg.  I think she is diffusely weak.  She seems to lean off to the right.   DEEP TENDON REFLEXES:   She is diffusely hyporeflexic.   CRANIAL NERVES:  There was some question about her visual acuity and I would like the chance to test this next week.   PSYCHIATRIC:   MENTAL STATUS:   There  apparently is both depression, which is obvious, and memory issues.  She did not tolerate Aricept (ended up in the ER) and/or Namenda (rash).    ASSESSMENT/PLAN:  Status post right hip fracture.  Status post right hip arthroplasty.  This lady was nonambulatory before.  I think the goal should be for transfers with assistance.  She is on only Plavix.   She is not on anything for DVT prophylaxis.  This will need to be watched carefully.    Type 2 diabetes with probable neuropathy and PAD.  I am going to increase her Lantus insulin.  It would be worthwhile, as she has not had a hemoglobin A1c, to check this.  She is currently on 14 U.  I will increase her to 20 U.    Diastolically-mediated heart failure.   On Lasix 40 mg daily.  It is not clear to me that she is in heart failure currently.  We will leave her at Lasix 40.    History of right-sided weakness.  I could not really evaluate this properly with her right hip fracture and sitting in a wheelchair.  I  will try to do this next week.  Checking whether she has had neuroimaging would also be worthwhile.     Probable significant COPD.   She was not on oxygen previously.  Postoperative delirium.  This appears to have mostly resolved.    Probable severe depression.  Perhaps this has been longstanding.    This will be a very difficult case and a challenge to attempt to get to any form of functional level.  She is on her Remeron, not on Megace.  It is not a good time for Megace (postoperative delirium and/or postoperative thromboembolic risk).    CPT CODE: 25852

## 2014-04-10 NOTE — Telephone Encounter (Signed)
REVIEWED. PLEASE CALL PT's facility and daughter. I REVIEWED HER RECENT WEIGHT. PT DOES NOT NEED MEGACE AT THIS TIME.

## 2014-04-10 NOTE — Telephone Encounter (Signed)
PLEASE CALL PT's facility. Megace stopped because PT was GAINING JMEQAS(341 LBS) AND EATING BETTER. Her BMI was 25. If pt is losing weight again then Megace could be restarted. NEED PT LAST RECORDED WEIGHT PRIOR TO RE-STARTING MEGACE.

## 2014-04-10 NOTE — Telephone Encounter (Signed)
LM for Asa Lente to return call.

## 2014-04-10 NOTE — Telephone Encounter (Signed)
I called and informed [name] at the [facility] and I also called the pt's daughter, [name], and informed  [name] of the reason Megace was stopped and With the pt's weight, Dr. Oneida Alar feels that it is not necessary to have the Megace at this time.  [name] is also aware that the pt will be seeing the Dentist when he comes to the facility.

## 2014-04-10 NOTE — Telephone Encounter (Signed)
Asa Lente returned call. I informed her of the reason Dr. Oneida Alar stopped the Megace. She said that the pt weighed 147.2 on 04/09/2014. She is eating the pureed food and doing well at this time.  She is on the list to see the dentist when he comes to the facility.

## 2014-04-10 NOTE — Telephone Encounter (Signed)
T/C from [place] at the [facility] [phone]). She said the pt's daughter is concerned that she might need to go back on the Megace. She does have some issues with eating, but has some problems with her teeth. Asa Lente is making her an appt to have her teeth checked. She is currently on pureed diet, but pt's daughter thinks she might need the Megace again. Asa Lente just wanted to know why the Megace was discontinued, although she realizes it is not used long term.  Please advise!

## 2014-04-11 LAB — GLUCOSE, CAPILLARY
Glucose-Capillary: 129 mg/dL — ABNORMAL HIGH (ref 70–99)
Glucose-Capillary: 260 mg/dL — ABNORMAL HIGH (ref 70–99)
Glucose-Capillary: 144 mg/dL — ABNORMAL HIGH (ref 70–99)
Glucose-Capillary: 112 mg/dL — ABNORMAL HIGH (ref 70–99)
Glucose-Capillary: 287 mg/dL — ABNORMAL HIGH (ref 70–99)
Glucose-Capillary: 205 mg/dL — ABNORMAL HIGH (ref 70–99)
Glucose-Capillary: 126 mg/dL — ABNORMAL HIGH (ref 70–99)

## 2014-04-12 LAB — GLUCOSE, CAPILLARY
Glucose-Capillary: 205 mg/dL — ABNORMAL HIGH (ref 70–99)
Glucose-Capillary: 289 mg/dL — ABNORMAL HIGH (ref 70–99)
Glucose-Capillary: 134 mg/dL — ABNORMAL HIGH (ref 70–99)
Glucose-Capillary: 238 mg/dL — ABNORMAL HIGH (ref 70–99)

## 2014-04-13 LAB — GLUCOSE, CAPILLARY
Glucose-Capillary: 122 mg/dL — ABNORMAL HIGH (ref 70–99)
Glucose-Capillary: 261 mg/dL — ABNORMAL HIGH (ref 70–99)
Glucose-Capillary: 231 mg/dL — ABNORMAL HIGH (ref 70–99)
Glucose-Capillary: 223 mg/dL — ABNORMAL HIGH (ref 70–99)

## 2014-04-14 LAB — GLUCOSE, CAPILLARY
Glucose-Capillary: 119 mg/dL — ABNORMAL HIGH (ref 70–99)
Glucose-Capillary: 271 mg/dL — ABNORMAL HIGH (ref 70–99)
Glucose-Capillary: 221 mg/dL — ABNORMAL HIGH (ref 70–99)
Glucose-Capillary: 170 mg/dL — ABNORMAL HIGH (ref 70–99)

## 2014-04-15 LAB — GLUCOSE, CAPILLARY
Glucose-Capillary: 90 mg/dL (ref 70–99)
Glucose-Capillary: 201 mg/dL — ABNORMAL HIGH (ref 70–99)

## 2014-04-16 ENCOUNTER — Ambulatory Visit (INDEPENDENT_AMBULATORY_CARE_PROVIDER_SITE_OTHER): Payer: Self-pay | Admitting: Orthopedic Surgery

## 2014-04-16 ENCOUNTER — Emergency Department (HOSPITAL_COMMUNITY): Payer: Medicare Other

## 2014-04-16 ENCOUNTER — Encounter (HOSPITAL_COMMUNITY): Payer: Self-pay | Admitting: Emergency Medicine

## 2014-04-16 ENCOUNTER — Encounter: Payer: Self-pay | Admitting: Orthopedic Surgery

## 2014-04-16 ENCOUNTER — Inpatient Hospital Stay (HOSPITAL_COMMUNITY)
Admission: EM | Admit: 2014-04-16 | Discharge: 2014-04-20 | DRG: 309 | Disposition: A | Discharge status: Skilled Nursing Facility | Payer: Medicare Other | Attending: Internal Medicine | Admitting: Internal Medicine

## 2014-04-16 VITALS — BP 102/60 | Ht 64.0 in | Wt 153.0 lb

## 2014-04-16 DIAGNOSIS — L89609 Pressure ulcer of unspecified heel, unspecified stage: Secondary | ICD-10-CM | POA: Diagnosis present

## 2014-04-16 DIAGNOSIS — E1142 Type 2 diabetes mellitus with diabetic polyneuropathy: Secondary | ICD-10-CM | POA: Diagnosis present

## 2014-04-16 DIAGNOSIS — R748 Abnormal levels of other serum enzymes: Secondary | ICD-10-CM | POA: Diagnosis not present

## 2014-04-16 DIAGNOSIS — E1149 Type 2 diabetes mellitus with other diabetic neurological complication: Secondary | ICD-10-CM | POA: Diagnosis present

## 2014-04-16 DIAGNOSIS — I658 Occlusion and stenosis of other precerebral arteries: Secondary | ICD-10-CM | POA: Diagnosis present

## 2014-04-16 DIAGNOSIS — Z8249 Family history of ischemic heart disease and other diseases of the circulatory system: Secondary | ICD-10-CM | POA: Diagnosis not present

## 2014-04-16 DIAGNOSIS — M6281 Muscle weakness (generalized): Secondary | ICD-10-CM | POA: Diagnosis not present

## 2014-04-16 DIAGNOSIS — L8995 Pressure ulcer of unspecified site, unstageable: Secondary | ICD-10-CM | POA: Diagnosis present

## 2014-04-16 DIAGNOSIS — S72009D Fracture of unspecified part of neck of unspecified femur, subsequent encounter for closed fracture with routine healing: Secondary | ICD-10-CM

## 2014-04-16 DIAGNOSIS — I25119 Atherosclerotic heart disease of native coronary artery with unspecified angina pectoris: Secondary | ICD-10-CM | POA: Diagnosis present

## 2014-04-16 DIAGNOSIS — F329 Major depressive disorder, single episode, unspecified: Secondary | ICD-10-CM | POA: Diagnosis present

## 2014-04-16 DIAGNOSIS — F0390 Unspecified dementia without behavioral disturbance: Secondary | ICD-10-CM | POA: Diagnosis present

## 2014-04-16 DIAGNOSIS — Z87891 Personal history of nicotine dependence: Secondary | ICD-10-CM

## 2014-04-16 DIAGNOSIS — I509 Heart failure, unspecified: Secondary | ICD-10-CM | POA: Diagnosis present

## 2014-04-16 DIAGNOSIS — E119 Type 2 diabetes mellitus without complications: Secondary | ICD-10-CM | POA: Diagnosis present

## 2014-04-16 DIAGNOSIS — E871 Hypo-osmolality and hyponatremia: Secondary | ICD-10-CM | POA: Diagnosis present

## 2014-04-16 DIAGNOSIS — I471 Supraventricular tachycardia, unspecified: Secondary | ICD-10-CM | POA: Diagnosis not present

## 2014-04-16 DIAGNOSIS — Z801 Family history of malignant neoplasm of trachea, bronchus and lung: Secondary | ICD-10-CM

## 2014-04-16 DIAGNOSIS — D649 Anemia, unspecified: Secondary | ICD-10-CM | POA: Diagnosis present

## 2014-04-16 DIAGNOSIS — F3289 Other specified depressive episodes: Secondary | ICD-10-CM | POA: Diagnosis present

## 2014-04-16 DIAGNOSIS — I2510 Atherosclerotic heart disease of native coronary artery without angina pectoris: Secondary | ICD-10-CM | POA: Diagnosis not present

## 2014-04-16 DIAGNOSIS — R0600 Dyspnea, unspecified: Secondary | ICD-10-CM

## 2014-04-16 DIAGNOSIS — I6529 Occlusion and stenosis of unspecified carotid artery: Secondary | ICD-10-CM | POA: Diagnosis present

## 2014-04-16 DIAGNOSIS — R1310 Dysphagia, unspecified: Secondary | ICD-10-CM | POA: Diagnosis not present

## 2014-04-16 DIAGNOSIS — E785 Hyperlipidemia, unspecified: Secondary | ICD-10-CM | POA: Diagnosis present

## 2014-04-16 DIAGNOSIS — K743 Primary biliary cirrhosis: Secondary | ICD-10-CM | POA: Diagnosis present

## 2014-04-16 DIAGNOSIS — R262 Difficulty in walking, not elsewhere classified: Secondary | ICD-10-CM | POA: Diagnosis not present

## 2014-04-16 DIAGNOSIS — R0609 Other forms of dyspnea: Secondary | ICD-10-CM | POA: Diagnosis not present

## 2014-04-16 DIAGNOSIS — L89610 Pressure ulcer of right heel, unstageable: Secondary | ICD-10-CM

## 2014-04-16 DIAGNOSIS — S72009A Fracture of unspecified part of neck of unspecified femur, initial encounter for closed fracture: Secondary | ICD-10-CM

## 2014-04-16 DIAGNOSIS — R7402 Elevation of levels of lactic acid dehydrogenase (LDH): Secondary | ICD-10-CM | POA: Diagnosis present

## 2014-04-16 DIAGNOSIS — Z9010 Acquired absence of unspecified breast and nipple: Secondary | ICD-10-CM | POA: Diagnosis not present

## 2014-04-16 DIAGNOSIS — Z853 Personal history of malignant neoplasm of breast: Secondary | ICD-10-CM

## 2014-04-16 DIAGNOSIS — Z471 Aftercare following joint replacement surgery: Secondary | ICD-10-CM | POA: Diagnosis not present

## 2014-04-16 DIAGNOSIS — Z9181 History of falling: Secondary | ICD-10-CM | POA: Diagnosis not present

## 2014-04-16 DIAGNOSIS — Z96649 Presence of unspecified artificial hip joint: Secondary | ICD-10-CM

## 2014-04-16 DIAGNOSIS — J984 Other disorders of lung: Secondary | ICD-10-CM | POA: Diagnosis not present

## 2014-04-16 DIAGNOSIS — I5032 Chronic diastolic (congestive) heart failure: Secondary | ICD-10-CM | POA: Diagnosis present

## 2014-04-16 DIAGNOSIS — Z8051 Family history of malignant neoplasm of kidney: Secondary | ICD-10-CM | POA: Diagnosis not present

## 2014-04-16 DIAGNOSIS — I779 Disorder of arteries and arterioles, unspecified: Secondary | ICD-10-CM

## 2014-04-16 DIAGNOSIS — Z7902 Long term (current) use of antithrombotics/antiplatelets: Secondary | ICD-10-CM

## 2014-04-16 DIAGNOSIS — R489 Unspecified symbolic dysfunctions: Secondary | ICD-10-CM | POA: Diagnosis not present

## 2014-04-16 DIAGNOSIS — I10 Essential (primary) hypertension: Secondary | ICD-10-CM | POA: Diagnosis present

## 2014-04-16 DIAGNOSIS — N390 Urinary tract infection, site not specified: Secondary | ICD-10-CM | POA: Diagnosis not present

## 2014-04-16 DIAGNOSIS — R7401 Elevation of levels of liver transaminase levels: Secondary | ICD-10-CM | POA: Diagnosis present

## 2014-04-16 DIAGNOSIS — IMO0001 Reserved for inherently not codable concepts without codable children: Secondary | ICD-10-CM

## 2014-04-16 DIAGNOSIS — Z794 Long term (current) use of insulin: Secondary | ICD-10-CM

## 2014-04-16 DIAGNOSIS — R079 Chest pain, unspecified: Secondary | ICD-10-CM

## 2014-04-16 DIAGNOSIS — R279 Unspecified lack of coordination: Secondary | ICD-10-CM | POA: Diagnosis not present

## 2014-04-16 DIAGNOSIS — I739 Peripheral vascular disease, unspecified: Secondary | ICD-10-CM

## 2014-04-16 DIAGNOSIS — S72001A Fracture of unspecified part of neck of right femur, initial encounter for closed fracture: Secondary | ICD-10-CM

## 2014-04-16 DIAGNOSIS — R740 Nonspecific elevation of levels of transaminase and lactic acid dehydrogenase [LDH]: Secondary | ICD-10-CM

## 2014-04-16 DIAGNOSIS — R5381 Other malaise: Secondary | ICD-10-CM | POA: Diagnosis present

## 2014-04-16 DIAGNOSIS — R002 Palpitations: Secondary | ICD-10-CM | POA: Diagnosis not present

## 2014-04-16 DIAGNOSIS — R41841 Cognitive communication deficit: Secondary | ICD-10-CM | POA: Diagnosis not present

## 2014-04-16 DIAGNOSIS — E1165 Type 2 diabetes mellitus with hyperglycemia: Secondary | ICD-10-CM

## 2014-04-16 HISTORY — DX: Disorientation, unspecified: R41.0

## 2014-04-16 HISTORY — DX: Pressure ulcer of right heel, unstageable: L89.610

## 2014-04-16 HISTORY — DX: Acute diastolic (congestive) heart failure: I50.31

## 2014-04-16 HISTORY — DX: Fracture of unspecified part of neck of right femur, initial encounter for closed fracture: S72.001A

## 2014-04-16 HISTORY — DX: Syncope and collapse: R55

## 2014-04-16 LAB — COMPREHENSIVE METABOLIC PANEL
ALT: 37 U/L — ABNORMAL HIGH (ref 0–35)
AST: 52 U/L — ABNORMAL HIGH (ref 0–37)
Albumin: 2.5 g/dL — ABNORMAL LOW (ref 3.5–5.2)
Alkaline Phosphatase: 738 U/L — ABNORMAL HIGH (ref 39–117)
BUN: 11 mg/dL (ref 6–23)
CO2: 22 mEq/L (ref 19–32)
Calcium: 9.3 mg/dL (ref 8.4–10.5)
Chloride: 93 mEq/L — ABNORMAL LOW (ref 96–112)
Creatinine, Ser: 0.72 mg/dL (ref 0.50–1.10)
GFR calc Af Amer: >90 mL/min (ref >90)
GFR calc non Af Amer: 78 mL/min — ABNORMAL LOW (ref >90)
Glucose, Bld: 256 mg/dL — ABNORMAL HIGH (ref 70–99)
Potassium: 3.9 mEq/L (ref 3.7–5.3)
Sodium: 131 mEq/L — ABNORMAL LOW (ref 137–147)
Total Bilirubin: 0.5 mg/dL (ref 0.3–1.2)
Total Protein: 7.3 g/dL (ref 6.0–8.3)

## 2014-04-16 LAB — CBC WITH DIFFERENTIAL/PLATELET
Basophils Absolute: 0.1 10*3/uL (ref 0.0–0.1)
Basophils Relative: 2 % — ABNORMAL HIGH (ref 0–1)
Eosinophils Absolute: 0.4 10*3/uL (ref 0.0–0.7)
Eosinophils Relative: 8 % — ABNORMAL HIGH (ref 0–5)
HCT: 33.2 % — ABNORMAL LOW (ref 36.0–46.0)
Hemoglobin: 11 g/dL — ABNORMAL LOW (ref 12.0–15.0)
Lymphocytes Relative: 31 % (ref 12–46)
Lymphs Abs: 1.6 10*3/uL (ref 0.7–4.0)
MCH: 32.7 pg (ref 26.0–34.0)
MCHC: 33.1 g/dL (ref 30.0–36.0)
MCV: 98.8 fL (ref 78.0–100.0)
Monocytes Absolute: 0.5 10*3/uL (ref 0.1–1.0)
Monocytes Relative: 9 % (ref 3–12)
Neutro Abs: 2.6 10*3/uL (ref 1.7–7.7)
Neutrophils Relative %: 50 % (ref 43–77)
Platelets: 310 10*3/uL (ref 150–400)
RBC: 3.36 MIL/uL — ABNORMAL LOW (ref 3.87–5.11)
RDW: 13.6 % (ref 11.5–15.5)
WBC: 5.1 10*3/uL (ref 4.0–10.5)

## 2014-04-16 LAB — GLUCOSE, CAPILLARY
Glucose-Capillary: 119 mg/dL — ABNORMAL HIGH (ref 70–99)
Glucose-Capillary: 182 mg/dL — ABNORMAL HIGH (ref 70–99)
Glucose-Capillary: 140 mg/dL — ABNORMAL HIGH (ref 70–99)
Glucose-Capillary: 264 mg/dL — ABNORMAL HIGH (ref 70–99)
Glucose-Capillary: 170 mg/dL — ABNORMAL HIGH (ref 70–99)

## 2014-04-16 LAB — TROPONIN I: Troponin I: <0.3 ng/mL (ref <0.30)

## 2014-04-16 MED ORDER — SODIUM CHLORIDE 0.9 % IV BOLUS (SEPSIS)
500.0000 mL | Freq: Once | INTRAVENOUS | Status: AC
  Administered 2014-04-16: 500 mL via INTRAVENOUS

## 2014-04-16 MED ORDER — IOHEXOL 350 MG/ML SOLN: 100.0000 mL | Freq: Once | INTRAVENOUS | Status: AC | PRN

## 2014-04-16 MED ORDER — ASPIRIN 325 MG PO TABS
325.0000 mg | ORAL_TABLET | Freq: Once | ORAL | Status: AC
  Administered 2014-04-16: 325 mg via ORAL
  Filled 2014-04-16: qty 1

## 2014-04-16 NOTE — ED Notes (Addendum)
Pt to department from [facility] via EMS.  Per report, pt c./o chest pain earlier tonight.  12 lead EKG done previously at [facility].  Pt not currently c/o any CP, does report some pain in right hip. Pt underwent hip surgery on 5/13.    Pt was given her PRN Xanax at 2000 and Vicodin at 1708.  Pt in no distress at present time.

## 2014-04-16 NOTE — ED Notes (Signed)
Pt resting quietly with eyes closed.  Respirations regular, even and unlabored.  No distress noted.

## 2014-04-16 NOTE — Progress Notes (Signed)
Patient ID: KAZUKO CLEMENCE, female   DOB: 06-Mar-1931, 80 y.o.   MRN: [id number]  Chief Complaint  Patient presents with  . Follow-up    Hospital follow up Arthroplasty Bipolar Right Hip DOS 03/28/14    BP 102/60  Ht 5' 4" (1.626 m)  Wt 153 lb (69.4 kg)  BMI 26.25 kg/m2  Right hip fracture status post right bipolar  6 weeks postop. The patient complains of pain in the right hip she is on Vicodin 5 mg looks like you for when necessary. It is unusual at this point to have significant pain in the hip. I looked at her wound looks fine. Her leg lengths look equal. She did have painful range of motion right hip  Recommend x-ray right hip and reviewed the situation with her nurse specifically regarding her response to pain medication to see if it needs to be changed  Follow up with me in about a month  I would like to get an x-ray of her hip as soon as possible

## 2014-04-16 NOTE — ED Provider Notes (Signed)
CSN: [id number]     Arrival date & time 04/16/14  2116 History  This chart was scribed for Michele Relic, MD by Vernell Barrier, ED scribe. This patient was seen in room APA03/APA03 and the patient's care was started at 9:55 PM.    Chief Complaint  Patient presents with  . Chest Pain   The history is provided by a relative. The history is limited by the condition of the patient. No language interpreter was used.   HPI Comments: [name] is a 80 y.o. female w/ hx of heart disease and heart failure presents to the Emergency Department brought in by EMS from [facility] complaining of chest pain episode tonight; unclear of type or quality due to patient's mental status. First reported by pt according to relative 3 hours ago. Been at [facility] for rehab prior to ED visit. 6 weeks post-op right hip surgery following a fall and broken hip as a result. Still unable to walk due to pain. Been put on oxygen since hip surgery. At baseline, has confusion that has been worse since after surgery. Denies fever, nausea, vomiting, diarrhea, cough, diaphoresis, or abdominal pain. Patient's mental status is currently at baseline according to family.  Past Medical History  Diagnosis Date  . History of GI bleed   . Parotid tumor   . History of breast cancer   . Type 2 diabetes mellitus   . Osteopenia   . Essential hypertension, benign   . Hyperlipidemia   . Depression   . Anxiety   . Chronic headaches   . Carotid artery disease     Bilateral ICA occlusion  . Orthostatic hypotension     Diabetic polyneuropathy/diabetic dysautonomia   . Coronary atherosclerosis of native coronary artery     Mild nonobstructive disease at cardiac catheterization May 2011  . PSVT (paroxysmal supraventricular tachycardia)   . Acute diastolic congestive heart failure 03/21/2014  . Fracture of femoral neck, right, closed 03/28/2014  . Acute delirium 06/23/2013  . Syncope and collapse 04/15/2010    Qualifier: Diagnosis of   By: Angelena Form, MD, Harrell Gave    . Pressure ulcer, heel, right, unstageable 04/17/2014   Past Surgical History  Procedure Laterality Date  . Mastectomy  1994    Left breast -related to breast cancer  . Vesicovaginal fistula closure w/ tah  1979  . Cholecystectomy  2008  . Hemorrhoid surgery  1960s  . Laparoscopic appendectomy N/A 04/13/2013    Procedure: APPENDECTOMY LAPAROSCOPIC;  Surgeon: Donato Heinz, MD;  Location: [facility];  Service: General;  Laterality: N/A;  . Colonoscopy  2010    Dr. Oneida Alar: single tubular adenoma, one hyperplastic polyp. Next TCS 2020 health permitting.  . Hip arthroplasty Right 03/28/2014    Procedure: ARTHROPLASTY BIPOLAR HIP;  Surgeon: Carole Civil, MD;  Location: [facility] OR[facility];  Service: Orthopedics;  Laterality: Right;   Family History  Problem Relation Age of Onset  . Heart failure Mother   . Cirrhosis Brother   . Lung cancer Father     Brain METS  . Kidney cancer Mother   . Kidney cancer Sister    History  Substance Use Topics  . Smoking status: Former Smoker -- 0.50 packs/day for 40 years    Types: Cigarettes    Quit date: 03/30/2013  . Smokeless tobacco: Not on file  . Alcohol Use: No   OB History   Grav Para Term Preterm Abortions TAB SAB Ect Mult Living  Review of Systems  10 Systems reviewed and all are negative for acute change except as noted in the HPI.  Allergies  Aricept; Namenda; Codeine; Latex; and Penicillins  Home Medications   Prior to Admission medications   Medication Sig Start Date End Date Taking? Authorizing Provider  ALPRAZolam (XANAX) 0.25 MG tablet Take 1 tablet (0.25 mg total) by mouth 2 (two) times daily as needed for anxiety. 03/30/14  Yes [name], MD  atorvastatin (LIPITOR) 10 MG tablet Take 1 tablet (10 mg total) by mouth daily. 02/20/14  Yes [name], MD  cholecalciferol (VITAMIN D) 1000 UNITS tablet Take 1,000 Units by mouth every morning.    Yes Historical Provider, MD   clopidogrel (PLAVIX) 75 MG tablet Take 1 tablet (75 mg total) by mouth daily with breakfast. 02/20/14  Yes [name], MD  docusate sodium 100 MG CAPS Take 100 mg by mouth 2 (two) times daily. 03/30/14  Yes [name], MD  esomeprazole (NEXIUM) 40 MG capsule Take 40 mg by mouth at bedtime.    Yes Historical Provider, MD  feeding supplement, GLUCERNA SHAKE, (GLUCERNA SHAKE) LIQD Take 237 mLs by mouth 3 (three) times daily between meals. 03/30/14  Yes [name], MD  furosemide (LASIX) 40 MG tablet Take 1 tablet (40 mg total) by mouth daily. 03/30/14  Yes [name], MD  HYDROcodone-acetaminophen (NORCO/VICODIN) 5-325 MG per tablet Take one tablet by mouth every 4 hours as needed for pain 04/05/14  Yes Pricilla Larsson, NP  insulin aspart (NOVOLOG) 100 UNIT/ML injection Inject 3 Units into the skin 3 (three) times daily before meals. *if CBG > 150   Yes Historical Provider, MD  insulin glargine (LANTUS) 100 UNIT/ML injection Inject 20 Units into the skin at bedtime.   Yes Historical Provider, MD  lisinopril (PRINIVIL,ZESTRIL) 20 MG tablet Take 1 tablet (20 mg total) by mouth daily. 06/27/13  Yes [name], MD  Methylfol-Methylcob-Acetylcyst (CEREFOLIN NAC PO) Take 1 tablet by mouth daily.   Yes Historical Provider, MD  metoprolol succinate (TOPROL-XL) 50 MG 24 hr tablet Take 1 tablet (50 mg total) by mouth daily. Take with or immediately following a meal. 06/27/13  Yes [name], MD  mirtazapine (REMERON) 30 MG tablet Take 30 mg by mouth at bedtime.   Yes Historical Provider, MD  polyethylene glycol (MIRALAX / GLYCOLAX) packet Take 17 g by mouth 2 (two) times daily. 03/30/14  Yes [name], MD  spironolactone (ALDACTONE) 25 MG tablet Take 25 mg by mouth daily.   Yes Historical Provider, MD  tiotropium (SPIRIVA) 18 MCG inhalation capsule Place 18 mcg into inhaler and inhale daily as needed (shortness of breath).   Yes Historical Provider, MD   Triage vitals: BP 112/50   Pulse 94  Temp(Src) 97.9 F (36.6 C) (Oral)  Resp 26  SpO2 94%  Physical Exam  Nursing note and vitals reviewed. Constitutional:  Awake, alert, nontoxic appearance.  HENT:  Head: Atraumatic.  Eyes: Right eye exhibits no discharge. Left eye exhibits no discharge.  Neck: Neck supple.  Cardiovascular: Normal rate, regular rhythm and normal heart sounds.   No murmur heard. Pulmonary/Chest: Effort normal. She exhibits no tenderness.  Bibasilar crackles.  Abdominal: Soft. There is no tenderness. There is no rebound.  Musculoskeletal: She exhibits no edema and no tenderness.  Baseline ROM, no obvious new focal weakness. Capillary refill less than 2 seconds on bilateral feet. Right hip incision looks good. No erythema. No draining. Baseline right hip pain with  movement.  Neurological:  Mental status and motor strength appears baseline for patient and situation.  Skin: No rash noted.  Psychiatric: She has a normal mood and affect.    ED Course  Procedures (including critical care time) DIAGNOSTIC STUDIES: Oxygen Saturation is 94% on room air, normal by my interpretation.    COORDINATION OF CARE: At 10:06 PM: Discussed treatment plan with patient's family which includes continued monitoring.  Family understands and agrees with initial ED impression and plan with expectations set for ED visit. Moderate risk [place].  IV lost when CT attempted; VQ ordered for AM; handoff to Dr. Christy Gentles. [place] Reviewed  CBC WITH DIFFERENTIAL - Abnormal; Notable for the following:    RBC 3.36 (*)    Hemoglobin 11.0 (*)    HCT 33.2 (*)    Eosinophils Relative 8 (*)    Basophils Relative 2 (*)    All other components within normal limits  COMPREHENSIVE METABOLIC PANEL - Abnormal; Notable for the following:    Sodium 131 (*)    Chloride 93 (*)    Glucose, Bld 256 (*)    Albumin 2.5 (*)    AST 52 (*)    ALT 37 (*)    Alkaline Phosphatase 738 (*)    GFR calc non Af Amer 78 (*)     All other components within normal limits  BASIC METABOLIC PANEL - Abnormal; Notable for the following:    Potassium 3.5 (*)    Glucose, Bld 128 (*)    GFR calc non Af Amer 80 (*)    All other components within normal limits  PRO B NATRIURETIC PEPTIDE - Abnormal; Notable for the following:    Pro B Natriuretic peptide (BNP) 666.2 (*)    All other components within normal limits  HEMOGLOBIN A1C - Abnormal; Notable for the following:    Hemoglobin A1C 8.4 (*)    Mean Plasma Glucose 194 (*)    All other components within normal limits  GLUCOSE, CAPILLARY - Abnormal; Notable for the following:    Glucose-Capillary 118 (*)    All other components within normal limits  GLUCOSE, CAPILLARY - Abnormal; Notable for the following:    Glucose-Capillary 140 (*)    All other components within normal limits  BASIC METABOLIC PANEL - Abnormal; Notable for the following:    GFR calc non Af Amer 82 (*)    All other components within normal limits  HEPATIC FUNCTION PANEL - Abnormal; Notable for the following:    Albumin 2.5 (*)    AST 49 (*)    Alkaline Phosphatase 692 (*)    All other components within normal limits  CBC - Abnormal; Notable for the following:    RBC 3.06 (*)    Hemoglobin 10.2 (*)    HCT 30.5 (*)    All other components within normal limits  GLUCOSE, CAPILLARY - Abnormal; Notable for the following:    Glucose-Capillary 135 (*)    All other components within normal limits  GLUCOSE, CAPILLARY - Abnormal; Notable for the following:    Glucose-Capillary 154 (*)    All other components within normal limits  GLUCOSE, CAPILLARY - Abnormal; Notable for the following:    Glucose-Capillary 205 (*)    All other components within normal limits  GLUCOSE, CAPILLARY - Abnormal; Notable for the following:    Glucose-Capillary 169 (*)    All other components within normal limits  CBC - Abnormal; Notable for the following:  RBC 3.32 (*)    Hemoglobin 10.9 (*)    HCT 33.0 (*)    All  other components within normal limits  BASIC METABOLIC PANEL - Abnormal; Notable for the following:    Glucose, Bld 137 (*)    GFR calc non Af Amer 81 (*)    All other components within normal limits  GLUCOSE, CAPILLARY - Abnormal; Notable for the following:    Glucose-Capillary 157 (*)    All other components within normal limits  GLUCOSE, CAPILLARY - Abnormal; Notable for the following:    Glucose-Capillary 123 (*)    All other components within normal limits  GLUCOSE, CAPILLARY - Abnormal; Notable for the following:    Glucose-Capillary 145 (*)    All other components within normal limits  GLUCOSE, CAPILLARY - Abnormal; Notable for the following:    Glucose-Capillary 280 (*)    All other components within normal limits  MRSA PCR SCREENING  TROPONIN I  TROPONIN I  MAGNESIUM  TSH  T4, FREE  MAGNESIUM  GLUCOSE, CAPILLARY  BASIC METABOLIC PANEL  CBC    Imaging Review Dg Hip Complete Right  04/19/2014   CLINICAL DATA:  Recent total hip replacement  EXAM: RIGHT HIP - COMPLETE 2+ VIEW  COMPARISON:  Mar 21, 2014  FINDINGS: Frontal pelvis as well as frontal and lateral left hip images were obtained. The patient is status post total hip replacement on the right with prosthetic component well-seated. No fracture or dislocation. There is moderate narrowing of the left hip joint. Bones are osteoporotic.  IMPRESSION: The prosthesis on the right well-seated. Moderate osteoarthritic change left hip joint. Bones are osteoporotic. No acute fracture or dislocation.   Electronically Signed   By: Lowella Grip M.D.   On: 04/19/2014 10:31     EKG Interpretation   Date/Time:  Tuesday April 17 2014 06:43:04 EDT Ventricular Rate:  91 PR Interval:  233 QRS Duration: 80 QT Interval:  366 QTC Calculation: 450 R Axis:   8 Text Interpretation:  Sinus rhythm Prolonged PR interval Biatrial  enlargement ED PHYSICIAN INTERPRETATION AVAILABLE IN [facility]  Confirmed by TEST, Record ([id number]) on  04/19/2014 10:35:27 AM      MDM   Final diagnoses:  Chest pain  Elevated liver enzymes  SVT (supraventricular tachycardia)    I personally performed the services described in this documentation, which was scribed in my presence. The recorded information has been reviewed and is accurate.    Michele Relic, MD 04/19/14 [phone]

## 2014-04-16 NOTE — ED Notes (Signed)
Spoke with staff at [facility] to verify time EKG was completed.  Time stamp on printout is January 2002, staff verified that this is incorrect. Reports EKG was actually completed tonight aprox 8pm.

## 2014-04-17 ENCOUNTER — Encounter (HOSPITAL_COMMUNITY): Payer: Self-pay | Admitting: Internal Medicine

## 2014-04-17 ENCOUNTER — Inpatient Hospital Stay (HOSPITAL_COMMUNITY): Payer: Medicare Other

## 2014-04-17 DIAGNOSIS — I471 Supraventricular tachycardia, unspecified: Principal | ICD-10-CM

## 2014-04-17 DIAGNOSIS — R079 Chest pain, unspecified: Secondary | ICD-10-CM | POA: Diagnosis not present

## 2014-04-17 DIAGNOSIS — I779 Disorder of arteries and arterioles, unspecified: Secondary | ICD-10-CM

## 2014-04-17 DIAGNOSIS — R7401 Elevation of levels of liver transaminase levels: Secondary | ICD-10-CM

## 2014-04-17 DIAGNOSIS — D649 Anemia, unspecified: Secondary | ICD-10-CM | POA: Diagnosis present

## 2014-04-17 DIAGNOSIS — L89610 Pressure ulcer of right heel, unstageable: Secondary | ICD-10-CM | POA: Diagnosis present

## 2014-04-17 DIAGNOSIS — R740 Nonspecific elevation of levels of transaminase and lactic acid dehydrogenase [LDH]: Secondary | ICD-10-CM

## 2014-04-17 DIAGNOSIS — R7402 Elevation of levels of lactic acid dehydrogenase (LDH): Secondary | ICD-10-CM

## 2014-04-17 HISTORY — DX: Pressure ulcer of right heel, unstageable: L89.610

## 2014-04-17 LAB — GLUCOSE, CAPILLARY
Glucose-Capillary: 118 mg/dL — ABNORMAL HIGH (ref 70–99)
Glucose-Capillary: 140 mg/dL — ABNORMAL HIGH (ref 70–99)
Glucose-Capillary: 135 mg/dL — ABNORMAL HIGH (ref 70–99)
Glucose-Capillary: 154 mg/dL — ABNORMAL HIGH (ref 70–99)

## 2014-04-17 LAB — MRSA PCR SCREENING: MRSA by PCR: NEGATIVE

## 2014-04-17 LAB — BASIC METABOLIC PANEL
BUN: 8 mg/dL (ref 6–23)
CO2: 25 mEq/L (ref 19–32)
Calcium: 9.1 mg/dL (ref 8.4–10.5)
Chloride: 99 mEq/L (ref 96–112)
Creatinine, Ser: 0.68 mg/dL (ref 0.50–1.10)
GFR calc Af Amer: >90 mL/min (ref >90)
GFR calc non Af Amer: 80 mL/min — ABNORMAL LOW (ref >90)
Glucose, Bld: 128 mg/dL — ABNORMAL HIGH (ref 70–99)
Potassium: 3.5 mEq/L — ABNORMAL LOW (ref 3.7–5.3)
Sodium: 137 mEq/L (ref 137–147)

## 2014-04-17 LAB — PRO B NATRIURETIC PEPTIDE: Pro B Natriuretic peptide (BNP): 666.2 pg/mL — ABNORMAL HIGH (ref 0–450)

## 2014-04-17 LAB — TSH: TSH: 1.35 u[IU]/mL (ref 0.350–4.500)

## 2014-04-17 LAB — T4, FREE: Free T4: 1.41 ng/dL (ref 0.80–1.80)

## 2014-04-17 LAB — MAGNESIUM: Magnesium: 1.6 mg/dL (ref 1.5–2.5)

## 2014-04-17 LAB — HEMOGLOBIN A1C
Hgb A1c MFr Bld: 8.4 % — ABNORMAL HIGH (ref <5.7)
Mean Plasma Glucose: 194 mg/dL — ABNORMAL HIGH (ref <117)

## 2014-04-17 LAB — TROPONIN I: Troponin I: <0.3 ng/mL (ref <0.30)

## 2014-04-17 MED ORDER — ONDANSETRON HCL 4 MG/2ML IJ SOLN: 4.0000 mg | Freq: Four times a day (QID) | INTRAMUSCULAR | Status: DC | PRN

## 2014-04-17 MED ORDER — FUROSEMIDE 40 MG PO TABS
40.0000 mg | ORAL_TABLET | Freq: Every day | ORAL | Status: DC
  Administered 2014-04-17 – 2014-04-20 (×4): 40 mg via ORAL
  Filled 2014-04-17 (×4): qty 1

## 2014-04-17 MED ORDER — LISINOPRIL 10 MG PO TABS: 20.0000 mg | ORAL_TABLET | Freq: Every day | ORAL | Status: DC

## 2014-04-17 MED ORDER — SPIRONOLACTONE 25 MG PO TABS
25.0000 mg | ORAL_TABLET | Freq: Every day | ORAL | Status: DC
  Administered 2014-04-17 – 2014-04-20 (×4): 25 mg via ORAL
  Filled 2014-04-17 (×4): qty 1

## 2014-04-17 MED ORDER — ALBUTEROL SULFATE (2.5 MG/3ML) 0.083% IN NEBU: 2.5000 mg | INHALATION_SOLUTION | RESPIRATORY_TRACT | Status: DC | PRN

## 2014-04-17 MED ORDER — ENOXAPARIN SODIUM 40 MG/0.4ML ~~LOC~~ SOLN
40.0000 mg | SUBCUTANEOUS | Status: DC
  Administered 2014-04-17 – 2014-04-20 (×4): 40 mg via SUBCUTANEOUS
  Filled 2014-04-17 (×4): qty 0.4

## 2014-04-17 MED ORDER — MAGNESIUM SULFATE 40 MG/ML IJ SOLN
1.0000 g | Freq: Once | INTRAMUSCULAR | Status: AC
  Administered 2014-04-17: 1 g via INTRAVENOUS
  Filled 2014-04-17: qty 50

## 2014-04-17 MED ORDER — DEXTROSE 5 % IV SOLN: 1.0000 g | Freq: Once | INTRAVENOUS | Status: DC

## 2014-04-17 MED ORDER — MIRTAZAPINE 30 MG PO TABS
30.0000 mg | ORAL_TABLET | Freq: Every day | ORAL | Status: DC
  Administered 2014-04-17 – 2014-04-19 (×3): 30 mg via ORAL
  Filled 2014-04-17 (×3): qty 1

## 2014-04-17 MED ORDER — ATORVASTATIN CALCIUM 10 MG PO TABS
10.0000 mg | ORAL_TABLET | Freq: Every day | ORAL | Status: DC
  Administered 2014-04-17 – 2014-04-20 (×4): 10 mg via ORAL
  Filled 2014-04-17 (×5): qty 1

## 2014-04-17 MED ORDER — CLOPIDOGREL BISULFATE 75 MG PO TABS
75.0000 mg | ORAL_TABLET | Freq: Every day | ORAL | Status: DC
  Administered 2014-04-17 – 2014-04-20 (×4): 75 mg via ORAL
  Filled 2014-04-17 (×4): qty 1

## 2014-04-17 MED ORDER — HYDROCODONE-ACETAMINOPHEN 5-325 MG PO TABS
1.0000 | ORAL_TABLET | ORAL | Status: DC | PRN
  Administered 2014-04-17 – 2014-04-20 (×11): 1 via ORAL
  Filled 2014-04-17 (×11): qty 1

## 2014-04-17 MED ORDER — INSULIN ASPART 100 UNIT/ML ~~LOC~~ SOLN
0.0000 [IU] | Freq: Every day | SUBCUTANEOUS | Status: DC
Start: 2014-04-17 — End: 2014-04-20
  Administered 2014-04-19: 2 [IU] via SUBCUTANEOUS

## 2014-04-17 MED ORDER — TIOTROPIUM BROMIDE MONOHYDRATE 18 MCG IN CAPS
18.0000 ug | ORAL_CAPSULE | Freq: Every day | RESPIRATORY_TRACT | Status: DC | PRN
  Filled 2014-04-17: qty 5

## 2014-04-17 MED ORDER — DOCUSATE SODIUM 100 MG PO CAPS
100.0000 mg | ORAL_CAPSULE | Freq: Two times a day (BID) | ORAL | Status: DC
  Administered 2014-04-17 – 2014-04-20 (×7): 100 mg via ORAL
  Filled 2014-04-17 (×7): qty 1

## 2014-04-17 MED ORDER — METOPROLOL SUCCINATE ER 50 MG PO TB24
50.0000 mg | ORAL_TABLET | Freq: Every day | ORAL | Status: DC
  Administered 2014-04-18: 50 mg via ORAL
  Filled 2014-04-17 (×2): qty 1

## 2014-04-17 MED ORDER — TECHNETIUM TO 99M ALBUMIN AGGREGATED
6.0000 | Freq: Once | INTRAVENOUS | Status: AC | PRN
  Administered 2014-04-17: 6 via INTRAVENOUS

## 2014-04-17 MED ORDER — POTASSIUM CHLORIDE IN NACL 20-0.9 MEQ/L-% IV SOLN
INTRAVENOUS | Status: DC
  Administered 2014-04-17 – 2014-04-19 (×3): via INTRAVENOUS

## 2014-04-17 MED ORDER — PANTOPRAZOLE SODIUM 40 MG PO TBEC
80.0000 mg | DELAYED_RELEASE_TABLET | Freq: Every day | ORAL | Status: DC
  Administered 2014-04-17 – 2014-04-19 (×3): 80 mg via ORAL
  Filled 2014-04-17 (×3): qty 2

## 2014-04-17 MED ORDER — INSULIN GLARGINE 100 UNIT/ML ~~LOC~~ SOLN
10.0000 [IU] | Freq: Every day | SUBCUTANEOUS | Status: DC
  Administered 2014-04-17 – 2014-04-19 (×3): 10 [IU] via SUBCUTANEOUS
  Filled 2014-04-17 (×3): qty 0.1

## 2014-04-17 MED ORDER — HYDROCODONE-ACETAMINOPHEN 5-325 MG PO TABS
1.0000 | ORAL_TABLET | Freq: Once | ORAL | Status: AC
  Administered 2014-04-17: 1 via ORAL
  Filled 2014-04-17: qty 1

## 2014-04-17 MED ORDER — ONDANSETRON HCL 4 MG PO TABS: 4.0000 mg | ORAL_TABLET | Freq: Four times a day (QID) | ORAL | Status: DC | PRN

## 2014-04-17 MED ORDER — DILTIAZEM HCL 100 MG IV SOLR
5.0000 mg/h | INTRAVENOUS | Status: DC
  Administered 2014-04-17: 10 mg/h via INTRAVENOUS
  Filled 2014-04-17: qty 100

## 2014-04-17 MED ORDER — ACETAMINOPHEN 650 MG RE SUPP: 650.0000 mg | Freq: Four times a day (QID) | RECTAL | Status: DC | PRN

## 2014-04-17 MED ORDER — POTASSIUM CHLORIDE CRYS ER 20 MEQ PO TBCR
20.0000 meq | EXTENDED_RELEASE_TABLET | Freq: Two times a day (BID) | ORAL | Status: AC
  Administered 2014-04-17 (×2): 20 meq via ORAL
  Filled 2014-04-17 (×3): qty 1

## 2014-04-17 MED ORDER — GLUCERNA SHAKE PO LIQD
237.0000 mL | Freq: Three times a day (TID) | ORAL | Status: DC
  Administered 2014-04-17 – 2014-04-20 (×6): 237 mL via ORAL

## 2014-04-17 MED ORDER — ALUM & MAG HYDROXIDE-SIMETH 200-200-20 MG/5ML PO SUSP: 30.0000 mL | Freq: Four times a day (QID) | ORAL | Status: DC | PRN

## 2014-04-17 MED ORDER — ACETAMINOPHEN 325 MG PO TABS: 650.0000 mg | ORAL_TABLET | Freq: Four times a day (QID) | ORAL | Status: DC | PRN

## 2014-04-17 MED ORDER — SODIUM CHLORIDE 0.9 % IV BOLUS (SEPSIS)
500.0000 mL | Freq: Once | INTRAVENOUS | Status: AC
  Administered 2014-04-17: 500 mL via INTRAVENOUS

## 2014-04-17 MED ORDER — ALPRAZOLAM 0.25 MG PO TABS
0.2500 mg | ORAL_TABLET | Freq: Two times a day (BID) | ORAL | Status: DC | PRN
  Administered 2014-04-18 (×2): 0.25 mg via ORAL
  Filled 2014-04-17 (×3): qty 1

## 2014-04-17 MED ORDER — TECHNETIUM TC 99M DIETHYLENETRIAME-PENTAACETIC ACID
40.0000 | Freq: Once | INTRAVENOUS | Status: AC | PRN
  Administered 2014-04-17: 40 via RESPIRATORY_TRACT

## 2014-04-17 MED ORDER — INSULIN ASPART 100 UNIT/ML ~~LOC~~ SOLN
0.0000 [IU] | Freq: Three times a day (TID) | SUBCUTANEOUS | Status: DC
  Administered 2014-04-17 (×2): 1 [IU] via SUBCUTANEOUS
  Administered 2014-04-18: 2 [IU] via SUBCUTANEOUS
  Administered 2014-04-18: 3 [IU] via SUBCUTANEOUS
  Administered 2014-04-19: 5 [IU] via SUBCUTANEOUS
  Administered 2014-04-19 (×2): 1 [IU] via SUBCUTANEOUS
  Administered 2014-04-20: 3 [IU] via SUBCUTANEOUS

## 2014-04-17 MED ORDER — POLYETHYLENE GLYCOL 3350 17 G PO PACK
17.0000 g | PACK | Freq: Every day | ORAL | Status: DC
  Administered 2014-04-18 – 2014-04-20 (×3): 17 g via ORAL
  Filled 2014-04-17 (×4): qty 1

## 2014-04-17 MED ORDER — VITAMIN D 1000 UNITS PO TABS
1000.0000 [IU] | ORAL_TABLET | Freq: Every morning | ORAL | Status: DC
  Administered 2014-04-17 – 2014-04-20 (×4): 1000 [IU] via ORAL
  Filled 2014-04-17 (×4): qty 1

## 2014-04-17 NOTE — Progress Notes (Signed)
While having VQ scan ,  Pt went into sinus tachycardia rate 120's.  Pt denies any pain or sob.  Pt no visible distress. B/p 109/64

## 2014-04-17 NOTE — ED Notes (Signed)
Pt refused third troponin draw.

## 2014-04-17 NOTE — ED Provider Notes (Signed)
I assumed care at signout Pt to have VQ study in AM Also will have repeat troponin testing as well EKG/labs reviewed Pt currently stable BP 137/73  Pulse 84  Temp(Src) 97.9 F (36.6 C) (Oral)  Resp 18  SpO2 96%   Sharyon Cable, MD 04/17/14 [phone]

## 2014-04-17 NOTE — ED Provider Notes (Signed)
Pt has responded to IV cardizem Repeat EKG   Date: 04/17/2014 0643am  Rate: 91  Rhythm: normal sinus rhythm  QRS Axis: normal  Intervals: normal  ST/T Wave abnormalities: normal  Conduction Disutrbances:first-degree A-V block      Sharyon Cable, MD 04/17/14 [phone]

## 2014-04-17 NOTE — ED Provider Notes (Signed)
While waiting on imaging, pt abruptly became tachycardic It appears to be regular, narrow complex tachycardia She has refused all IV access, but now will allow [place] to place IV   Sharyon Cable, MD 04/17/14 [phone]

## 2014-04-17 NOTE — ED Provider Notes (Signed)
HR is improving with cardizem On review of chart, pt has h/o paroxysmal SVT, suspect this may be the current problem, though I am hesitant to give adenosine She will be admitted to stepdown unit for further monitoring D/w dr Dyann Kief, will admit   EKG Interpretation  Date/Time:  Tuesday April 17 2014 06:13:21 EDT Ventricular Rate:  131 PR Interval:  223 QRS Duration: 73 QT Interval:  316 QTC Calculation: 466 R Axis:   1 Text Interpretation:  tachycardic  suspect SVT Repolarization abnormality, prob rate related Confirmed by Christy Gentles  MD, Donyae Kohn ([id number]) on 04/17/2014 6:19:23 AM        CRITICAL CARE Performed by: Sharyon Cable Total critical care time: 33 Critical care time was exclusive of separately billable procedures and treating other patients. Critical care was necessary to treat or prevent imminent or life-threatening deterioration. Critical care was time spent personally by me on the following activities: development of treatment plan with patient and/or surrogate as well as nursing, discussions with consultants, evaluation of patient's response to treatment, examination of patient, obtaining history from patient or surrogate, ordering and performing treatments and interventions, ordering and review of laboratory studies, ordering and review of radiographic studies, pulse oximetry and re-evaluation of patient's condition.   Sharyon Cable, MD 04/17/14 [id number]

## 2014-04-17 NOTE — ED Notes (Addendum)
IV access lost while in CT.  Attempted multiple times to reestablish access, unsuccessful.  Dr. Stevie Kern aware of inability to obtain access in order to complete imaging study.  Pt refused any additional attempts to obtain IV access.

## 2014-04-17 NOTE — Care Management Note (Addendum)
    Page 1 of 1   04/20/2014     11:37:12 AM CARE MANAGEMENT NOTE 04/20/2014  Patient:  [name], [name]   Account Number:  [id number]  Date Initiated:  04/17/2014  Documentation initiated by:  Theophilus Kinds  Subjective/Objective Assessment:   Pt admitted from [facility] with PSVT and CP. Pt will return to facility at discharge.     Action/Plan:   CSW is aware and will arrange discharge to facility when medically stable.   Anticipated DC Date:  04/19/2014   Anticipated DC Plan:  SKILLED NURSING FACILITY  In-house referral  Clinical Social Worker      DC Planning Services  CM consult      Choice offered to / List presented to:             Status of service:  Completed, signed off Medicare Important Message given?  YES (If response is "NO", the following Medicare IM given date fields will be blank) Date Medicare IM given:  04/20/2014 Date Additional Medicare IM given:    Discharge Disposition:  [place]  Per UR Regulation:    If discussed at Long Length of Stay Meetings, dates discussed:    Comments:  04/20/14 [place], RN BSN CM Pt discharged back to [facility] today. CSW to arrange discharge to facility.  04/17/14 [place], RN BSN CM

## 2014-04-17 NOTE — Progress Notes (Signed)
Paged dr. Caryn Section to notify that pt was back in sinus tachycardia rate in the 120's.  And pt was asymptomatic.

## 2014-04-17 NOTE — ED Notes (Signed)
Pt resting quietly with eyes closed.  Respirations regular, even and unlabored.  No distress noted or needs verbalized.

## 2014-04-17 NOTE — Progress Notes (Signed)
Spoke with dr. Caryn Section. And notified pt was still sinus tachycardia and b/p was 90/50's,  Hold betablocker.  Give 250cc bolus with existing fluids/  Call back at noon with current b/p and heart rate.

## 2014-04-17 NOTE — Care Management Utilization Note (Signed)
UR completed

## 2014-04-17 NOTE — Clinical Social Work Psychosocial (Signed)
Clinical Social Work Department BRIEF PSYCHOSOCIAL ASSESSMENT 04/17/2014  Patient:  [name], [name]     Account Number:  [id number]     Admit date:  04/16/2014  Clinical Social Worker:  Wyatt Haste  Date/Time:  04/17/2014 11:05 AM  Referred by:  CSW  Date Referred:  04/17/2014 Referred for  SNF Placement   Other Referral:   Interview type:  Patient Other interview type:   daughter- [name]    PSYCHOSOCIAL DATA Living Status:  FACILITY Admitted from facility:  [facility] Level of care:  [place] Primary support name:  [name] Primary support relationship to patient:  CHILD, ADULT Degree of support available:   supportive    CURRENT CONCERNS Current Concerns  Post-Acute Placement   Other Concerns:    SOCIAL WORK ASSESSMENT / PLAN CSW met with pt at bedside. Two of pt's daughters who live out of town were also present. Pt alert and oriented to self and place. Requests for CSW to talk with her daughter, [name]. [name] called CSW to discuss d/c planning. Reports that pt has been at [facility] for the past few weeks after a hip fracture. Prior to this, pt was living with [name] and pt's son. [name] reports pt has made very slow progress with PT and is still complaining of a lot of pain in her hip. She said that pt was seen by ortho yesterday and was concerned about level of pain pt is still experiencing. He ordered an xray to be done today. [name] has been very pleased with care and staff at [facility], outside of one tech at night. Pt has made several complaints against this person. [name] states they discussed with DON and facility is actively investigating issue. CSW spoke with Tami at [facility] who confirms that a complete assessment was completed yesterday and it will be reported if necessary. [name] said they plan to stay with pt at night until it is resolved for pt's comfort. Family has completed a bed hold and request pt to return to [facility] when medically stable.   Assessment/plan  status:  Psychosocial Support/Ongoing Assessment of Needs Other assessment/ plan:   Information/referral to community resources:   [facility]    PATIENT'S/FAMILY'S RESPONSE TO PLAN OF CARE: Pt defers to daughter, [name]. She requests return to [facility] and is satisfied with investigation [facility] is completing.       [name], [place]

## 2014-04-17 NOTE — Consult Note (Signed)
WOC wound consult note Reason for Consult: evaluate heel wounds. Pt s/p hip surgery 03/28/14 and transferred to [facility] center.  She has developed pressure ulcers on the bilateral heels. She has palpable pulses bilaterally.  Wound type: sDTI (susected deep tissue injury) right heel Resolving pressure ulcer left heel skin intact  Pressure Ulcer POA: Yes Measurement:right heel: 3.5cm x 3.0cm x 0 Wound ZHG:DJMEQA, deep maroon area, may have been blood filled blister that has reabsorbed at this time.  Drainage (amount, consistency, odor) none Periwound:intact Dressing procedure/placement/frequency: No topical care needed at this time. Will add Prevalon boots for offloading the heels. I have instructed the patient to lift and reposition the heel off the bed every hour as well.   Discussed POC with patient and bedside nurse.  Re consult if needed, will not follow at this time. Thanks  Delane Wessinger [place], [place] [phone])

## 2014-04-17 NOTE — ED Notes (Signed)
Pt continues to refuse lab draws or repeated attempts for IV access. Dr. Christy Gentles to discuss options with pt.

## 2014-04-17 NOTE — ED Notes (Signed)
Pt resting quietly.  No distress noted.

## 2014-04-17 NOTE — H&P (Signed)
[facility] History and Physical  [name] JME:[id number] DOB: [birth year] DOA: 04/16/2014  Referring physician: ED physician Dr. Stevie Kern PCP: [name], MD   Chief Complaint: Chest pain  HPI: [name] is a 80 y.o. female with a history of PSVT, nonobstructive coronary artery disease, diabetes mellitus, and chronic diastolic heart failure. She is also status post right hip surgery on 03/28/14. She was transferred to the [facility] for rehabilitation following discharge. She presents to the hospital this morning from the [facility] with a chief complaint of chest pain. She developed chest pain earlier this morning at rest. The pain is located left of the substernal area. She describes the pain as a smothering feeling. She had associated shortness of breath and palpitations. She denies associated diaphoresis, nausea, vomiting, or radiation of the chest pain. The pain subsided before she was transferred to the ED. She is currently without any complaints of chest pain. Her review of systems is negative for fever, chills, abdominal pain, diarrhea, cough, or pain with urination. Her review of systems is positive for right hip pain, particularly when she tries to ambulate with physical therapy.  In the ED, she was tachycardic with a heart rate ranging from 90-138 beats per minute and her blood pressure was in the 22L systolically. Her initial EKG revealed sinus rhythm, prolonged PR interval, and a heart rate of 94 beats per minute. Her followup EKG revealed tachycardia with a heart rate of 134 beats per minute. Her troponin I was negative. Her chest x-ray revealed interstitial coarsening which could be congestive or bronchitic with mild improvement in right basilar opacity. Her serum sodium is 131, alkaline phosphatase 738, AST 52, ALT 37. Pro BNP 666. She is being admitted for further evaluation and management.      Review of Systems:  As above in history present illness. In addition,  her review of systems is positive for some memory impairment. Otherwise review of systems is negative.  Past Medical History  Diagnosis Date  . History of GI bleed   . Parotid tumor   . History of breast cancer   . Type 2 diabetes mellitus   . Osteopenia   . Essential hypertension, benign   . Hyperlipidemia   . Depression   . Anxiety   . Chronic headaches   . Carotid artery disease     Bilateral ICA occlusion  . Orthostatic hypotension     Diabetic polyneuropathy/diabetic dysautonomia   . Coronary atherosclerosis of native coronary artery     Mild nonobstructive disease at cardiac catheterization May 2011  . PSVT (paroxysmal supraventricular tachycardia)   . Acute diastolic congestive heart failure 03/21/2014  . Fracture of femoral neck, right, closed 03/28/2014  . Acute delirium 06/23/2013  . Syncope and collapse 04/15/2010    Qualifier: Diagnosis of  By: Angelena Form, MD, Harrell Gave    . Pressure ulcer, heel, right, unstageable 04/17/2014   Past Surgical History  Procedure Laterality Date  . Mastectomy  1994    Left breast -related to breast cancer  . Vesicovaginal fistula closure w/ tah  1979  . Cholecystectomy  2008  . Hemorrhoid surgery  1960s  . Laparoscopic appendectomy N/A 04/13/2013    Procedure: APPENDECTOMY LAPAROSCOPIC;  Surgeon: Donato Heinz, MD;  Location: [facility];  Service: General;  Laterality: N/A;  . Colonoscopy  2010    Dr. Oneida Alar: single tubular adenoma, one hyperplastic polyp. Next TCS 2020 health permitting.  . Hip arthroplasty Right 03/28/2014    Procedure: ARTHROPLASTY  BIPOLAR HIP;  Surgeon: Carole Civil, MD;  Location: [facility];  Service: Orthopedics;  Laterality: Right;   Social History: She is widowed. She has 4 children all, 2 of her daughters are present. She is retired. She stopped smoking approximately one year ago. She has a 20-pack-year smoking history. She denies alcohol and illicit drug use. She lives in [place], but is currently a resident at  the [facility] for rehabilitation.   Allergies  Allergen Reactions  . Aricept [Donepezil Hcl]     Hives, vomiting and headache  . Namenda [Memantine Hcl]     hives  . Codeine   . Latex   . Penicillins Rash    Family History  Problem Relation Age of Onset  . Heart failure Mother   . Cirrhosis Brother   . Lung cancer Father     Brain METS  . Kidney cancer Mother   . Kidney cancer Sister      Prior to Admission medications   Medication Sig Start Date End Date Taking? Authorizing Provider  ALPRAZolam (XANAX) 0.25 MG tablet Take 1 tablet (0.25 mg total) by mouth 2 (two) times daily as needed for anxiety. 03/30/14  Yes [name], MD  atorvastatin (LIPITOR) 10 MG tablet Take 1 tablet (10 mg total) by mouth daily. 02/20/14  Yes [name], MD  cholecalciferol (VITAMIN D) 1000 UNITS tablet Take 1,000 Units by mouth every morning.    Yes Historical Provider, MD  clopidogrel (PLAVIX) 75 MG tablet Take 1 tablet (75 mg total) by mouth daily with breakfast. 02/20/14  Yes [name], MD  docusate sodium 100 MG CAPS Take 100 mg by mouth 2 (two) times daily. 03/30/14  Yes [name], MD  esomeprazole (NEXIUM) 40 MG capsule Take 40 mg by mouth at bedtime.    Yes Historical Provider, MD  feeding supplement, GLUCERNA SHAKE, (GLUCERNA SHAKE) LIQD Take 237 mLs by mouth 3 (three) times daily between meals. 03/30/14  Yes [name], MD  furosemide (LASIX) 40 MG tablet Take 1 tablet (40 mg total) by mouth daily. 03/30/14  Yes [name], MD  HYDROcodone-acetaminophen (NORCO/VICODIN) 5-325 MG per tablet Take one tablet by mouth every 4 hours as needed for pain 04/05/14  Yes Pricilla Larsson, NP  insulin aspart (NOVOLOG) 100 UNIT/ML injection Inject 3 Units into the skin 3 (three) times daily before meals. *if CBG > 150   Yes Historical Provider, MD  insulin glargine (LANTUS) 100 UNIT/ML injection Inject 20 Units into the skin at bedtime.   Yes Historical Provider, MD    lisinopril (PRINIVIL,ZESTRIL) 20 MG tablet Take 1 tablet (20 mg total) by mouth daily. 06/27/13  Yes [name], MD  Methylfol-Methylcob-Acetylcyst (CEREFOLIN NAC PO) Take 1 tablet by mouth daily.   Yes Historical Provider, MD  metoprolol succinate (TOPROL-XL) 50 MG 24 hr tablet Take 1 tablet (50 mg total) by mouth daily. Take with or immediately following a meal. 06/27/13  Yes [name], MD  mirtazapine (REMERON) 30 MG tablet Take 30 mg by mouth at bedtime.   Yes Historical Provider, MD  polyethylene glycol (MIRALAX / GLYCOLAX) packet Take 17 g by mouth 2 (two) times daily. 03/30/14  Yes [name], MD  spironolactone (ALDACTONE) 25 MG tablet Take 25 mg by mouth daily.   Yes Historical Provider, MD  tiotropium (SPIRIVA) 18 MCG inhalation capsule Place 18 mcg into inhaler and inhale daily as needed (shortness of breath).   Yes Historical Provider, MD   Physical  Exam: Filed Vitals:   04/17/14 0851  BP: 109/64  Pulse: 122  Temp:   Resp: 18    BP 109/64  Pulse 122  Temp(Src) 97.9 F (36.6 C) (Oral)  Resp 18  Ht _0  (1.626 m)  Wt 61.4 kg (135 lb 5.8 oz)  BMI 23.22 kg/m2  SpO2 97%  General:  Appears calm and comfortable; no acute distress. Eyes: PERRL, normal lids, irises & conjunctiva ENT: grossly normal hearing, lips & tongue Neck: no LAD, masses or thyromegaly Cardiovascular: S1, S2, with mild tachycardia. No LE edema. Pedal pulses palpable bilaterally. Telemetry: Sinus tachycardia  Respiratory: Breathing is nonlabored. Occasional crackles auscultated in the bases, but otherwise clear anteriorly. Abdomen: Positive bowel sounds soft, nontender, nondistended. Skin: There is a large purplish blisterlike ulcer on her right heel, unstageable. Right hip incision site is healing and without edema or drainage. Musculoskeletal: No acute hot red joints, however, there is some mild tenderness over the right hip postoperative site. She experiences some discomfort with flexion and  extension of the hip. Psychiatric: She is alert, but has a flat affect. Her speech is clear. Neurologic: Cranial nerves II through XII are intact. She is alert and oriented to herself, hospital, and children. She has some memory impairment with regards to her medical conditions and dates.           Labs on Admission:  Basic Metabolic Panel:  Recent Labs Lab 04/16/14 2237 04/17/14 0749  NA 131* 137  K 3.9 3.5*  CL 93* 99  CO2 22 25  GLUCOSE 256* 128*  BUN 11 8  CREATININE 0.72 0.68  CALCIUM 9.3 9.1  MG  --  1.6   Liver Function Tests:  Recent Labs Lab 04/16/14 2237  AST 52*  ALT 37*  ALKPHOS 738*  BILITOT 0.5  PROT 7.3  ALBUMIN 2.5*   No results found for this basename: LIPASE, AMYLASE,  in the last 168 hours No results found for this basename: AMMONIA,  in the last 168 hours CBC:  Recent Labs Lab 04/16/14 2237  WBC 5.1  NEUTROABS 2.6  HGB 11.0*  HCT 33.2*  MCV 98.8  PLT 310   Cardiac Enzymes:  Recent Labs Lab 04/16/14 2249 04/17/14 0128  TROPONINI <0.30 <0.30    BNP (last 3 results)  Recent Labs  02/18/14 0724 03/21/14 1741 04/17/14 0749  PROBNP 285.7 556.1* 666.2*   CBG:  Recent Labs Lab 04/15/14 2131 04/16/14 0805 04/16/14 1056 04/16/14 1714 04/17/14 0912  GLUCAP 201* 140* 264* 170* 118*    Radiological Exams on Admission: Dg Chest Port 1 View  04/16/2014   CLINICAL DATA:  Chest pain.  EXAM: PORTABLE CHEST - 1 VIEW  COMPARISON:  03/24/2014  FINDINGS: Unchanged convexity of the upper mediastinum, vascular based on chest CT 06/23/2013. Mild cardiomegaly which is chronic.  Decreased but persistent diffuse interstitial coarsening. Persistent asymmetric streaky density at the right base. No evidence of effusion or pneumothorax. Left mastectomy.  IMPRESSION: 1. Interstitial coarsening which could be congestive or bronchitic. 2. Mild improvement in a right basilar opacity, possible clearing pneumonia or asymmetric edema.   Electronically  Signed   By: Jorje Guild M.D.   On: 04/16/2014 22:31    EKG: Independently reviewed. As above in history present illness.  Assessment/Plan Principal Problem:   PSVT (paroxysmal supraventricular tachycardia) Active Problems:   Chest pain   HYPERTENSION   DIABETES MELLITUS, TYPE II, UNCONTROLLED   CORONARY ATHEROSCLEROSIS NATIVE CORONARY ARTERY   Hyponatremia   Elevated transaminase  level   Pressure ulcer, heel, right, unstageable   Anemia   1. Chest pain, likely secondary to PSVT. The patient has a history of PSVT. She was given diltiazem in the ED with resolution. On exam and per telemetry, she has mild sinus tachycardia. Her cardiac enzymes are negative x2 thus far. Her EKG does not reveal suspicious or worrisome ST or T wave abnormalities. She has a known history of nonobstructive coronary artery disease per cardiac catheterization in May 2011. We'll continue to cycle cardiac enzymes. The VQ scan ordered in the ED and is pending. Will treat her supportively and symptomatically. 2. PSVT. As above. She may be a little volume depleted. Therefore, we'll hold furosemide until tomorrow and start her on gentle IV fluids. We'll continue Toprol XL for rate control. Consider adding small dosing of diltiazem if her blood pressure can tolerate it. We'll order a TSH and magnesium level for further evaluation. 3. Hypertension. Her blood pressure is low-normal. She may be a little volume contracted. She is treated with Toprol-XL, lisinopril, Aldactone, and Lasix chronically. We'll start gentle IV fluids. We'll hold Lasix until tomorrow. We'll discontinue lisinopril. Continue Toprol and Aldactone. 4. Mild diastolic dysfunction. Her 2-D echocardiogram in April 2015 revealed an ejection fraction of 65-70% and grade 1 diastolic dysfunction. We'll hold off on repeating 2-D echocardiogram unless clinically indicated. 5. Hyponatremia. Her serum sodium was initially 131, but with gentle IV fluids in the ED, it  has improved to 137. We'll continue to follow. 6. Diabetes mellitus, type II. We'll continue sliding scale NovoLog and a decreased dose of Lantus. Titrate accordingly. We'll order hemoglobin A1c. Will start a carbohydrate modified diet. 7. Status post right hip surgery, May 2015. We'll order physical therapy. 8. Right heel pressure ulcer. I have asked the nursing staff to place pillows underneath her legs as tolerated to offload the pressure. We'll order a wound care consult. 9. Elevated liver transaminases. She has a slightly elevated AST and ALT. Her alkaline phosphatase has apparently been elevated since 2014. We'll continue to monitor.    Code Status: Full code Family Communication: Discussed with daughters Disposition Plan: Anticipate discharge back to the [facility] in the next 24-48 hours depending on clinical status.  Time spent: One hour.  Rexene Alberts [facility] Pager [phone]  **Disclaimer: This note may have been dictated with voice recognition software. Similar sounding words can inadvertently be transcribed and this note may contain transcription errors which may not have been corrected upon publication of note.**

## 2014-04-18 DIAGNOSIS — IMO0001 Reserved for inherently not codable concepts without codable children: Secondary | ICD-10-CM

## 2014-04-18 DIAGNOSIS — E785 Hyperlipidemia, unspecified: Secondary | ICD-10-CM

## 2014-04-18 DIAGNOSIS — I5032 Chronic diastolic (congestive) heart failure: Secondary | ICD-10-CM

## 2014-04-18 DIAGNOSIS — I779 Disorder of arteries and arterioles, unspecified: Secondary | ICD-10-CM | POA: Diagnosis not present

## 2014-04-18 DIAGNOSIS — R748 Abnormal levels of other serum enzymes: Secondary | ICD-10-CM

## 2014-04-18 DIAGNOSIS — E1165 Type 2 diabetes mellitus with hyperglycemia: Secondary | ICD-10-CM

## 2014-04-18 DIAGNOSIS — R079 Chest pain, unspecified: Secondary | ICD-10-CM | POA: Diagnosis not present

## 2014-04-18 DIAGNOSIS — I10 Essential (primary) hypertension: Secondary | ICD-10-CM

## 2014-04-18 DIAGNOSIS — I471 Supraventricular tachycardia: Secondary | ICD-10-CM | POA: Diagnosis not present

## 2014-04-18 DIAGNOSIS — R0609 Other forms of dyspnea: Secondary | ICD-10-CM

## 2014-04-18 DIAGNOSIS — I498 Other specified cardiac arrhythmias: Secondary | ICD-10-CM

## 2014-04-18 DIAGNOSIS — R0989 Other specified symptoms and signs involving the circulatory and respiratory systems: Secondary | ICD-10-CM

## 2014-04-18 DIAGNOSIS — I2510 Atherosclerotic heart disease of native coronary artery without angina pectoris: Secondary | ICD-10-CM

## 2014-04-18 LAB — BASIC METABOLIC PANEL
BUN: 7 mg/dL (ref 6–23)
CO2: 23 mEq/L (ref 19–32)
Calcium: 9.2 mg/dL (ref 8.4–10.5)
Chloride: 103 mEq/L (ref 96–112)
Creatinine, Ser: 0.62 mg/dL (ref 0.50–1.10)
GFR calc Af Amer: >90 mL/min (ref >90)
GFR calc non Af Amer: 82 mL/min — ABNORMAL LOW (ref >90)
Glucose, Bld: 88 mg/dL (ref 70–99)
Potassium: 4.9 mEq/L (ref 3.7–5.3)
Sodium: 138 mEq/L (ref 137–147)

## 2014-04-18 LAB — HEPATIC FUNCTION PANEL
ALT: 33 U/L (ref 0–35)
AST: 49 U/L — ABNORMAL HIGH (ref 0–37)
Albumin: 2.5 g/dL — ABNORMAL LOW (ref 3.5–5.2)
Alkaline Phosphatase: 692 U/L — ABNORMAL HIGH (ref 39–117)
Bilirubin, Direct: <0.2 mg/dL (ref 0.0–0.3)
Total Bilirubin: 0.5 mg/dL (ref 0.3–1.2)
Total Protein: 7 g/dL (ref 6.0–8.3)

## 2014-04-18 LAB — CBC
HCT: 30.5 % — ABNORMAL LOW (ref 36.0–46.0)
Hemoglobin: 10.2 g/dL — ABNORMAL LOW (ref 12.0–15.0)
MCH: 33.3 pg (ref 26.0–34.0)
MCHC: 33.4 g/dL (ref 30.0–36.0)
MCV: 99.7 fL (ref 78.0–100.0)
Platelets: 328 10*3/uL (ref 150–400)
RBC: 3.06 MIL/uL — ABNORMAL LOW (ref 3.87–5.11)
RDW: 13.9 % (ref 11.5–15.5)
WBC: 5.4 10*3/uL (ref 4.0–10.5)

## 2014-04-18 LAB — GLUCOSE, CAPILLARY
Glucose-Capillary: 278 mg/dL — ABNORMAL HIGH (ref 70–99)
Glucose-Capillary: 88 mg/dL (ref 70–99)
Glucose-Capillary: 205 mg/dL — ABNORMAL HIGH (ref 70–99)
Glucose-Capillary: 169 mg/dL — ABNORMAL HIGH (ref 70–99)
Glucose-Capillary: 157 mg/dL — ABNORMAL HIGH (ref 70–99)

## 2014-04-18 LAB — MAGNESIUM: Magnesium: 1.8 mg/dL (ref 1.5–2.5)

## 2014-04-18 MED ORDER — METOPROLOL TARTRATE 50 MG PO TABS
50.0000 mg | ORAL_TABLET | Freq: Two times a day (BID) | ORAL | Status: DC
  Administered 2014-04-18 – 2014-04-20 (×4): 50 mg via ORAL
  Filled 2014-04-18 (×4): qty 1

## 2014-04-18 NOTE — Consult Note (Signed)
The patient was seen and examined, and I agree with the assessment and plan as documented above, with modifications as noted below. Pt admitted with chest pain due to symptomatic SVT. She has a history of hospitalizations in 2014 where both AVRT and atrial tachycardia were noted. Echo results as noted above with vigorous systolic function and small outflow gradient of 2.9 m/s. Coronary angiography by Dr. Olevia Perches on 03/31/2010 demonstrated a 40% mid LAD lesion. I agree with increasing metoprolol to 50 mg bid with continued BP monitoring.  She has no chest pain otherwise and is currently symptom free. I do not feel nuclear stress testing is indicated at this time. She can follow up with [name] in our office.

## 2014-04-18 NOTE — Progress Notes (Signed)
[facility] PROGRESS NOTE  ARGUSTA MCGANN GYI:[id number] DOB: [birth year] DOA: 04/16/2014 PCP: [name], MD  Assessment/Plan: 1. Chest pain. Possibly related to PSVT. Patient was given diltiazem in the emergency room with resolution. She's had recurrent tachycardia and recurrent chest discomfort. She is on Toprol once daily. This may need to be further adjusted. VQ scan is low probability for pulmonary embolus. She does have a history of nonobstructive coronary disease. She's had a recent echocardiogram. We'll request a cardiology consultation to help adjust her medications and see if any further workup is needed. 2. Hypertension. Blood pressure is stable. She is continued on Toprol and Aldactone. Lasix and lisinopril were held on admission. We'll hold off on resuming lisinopril for now, in case rate control needs to be adjusted. 3. Mild diastolic dysfunction. Lasix currently on hold. She does not appear to be volume overloaded. 4. Type 2 diabetes. Continue Lantus and sliding-scale NovoLog. 5. Status post right hip surgery. Physical therapy consultation. 6. Right heel pressure ulcer. Appreciate wound care recommendations.  Code Status: full code Family Communication: discussed with patient and daughters at the bedside Disposition Plan: discharge back to SNF   Consultants:    Procedures:    Antibiotics:    HPI/Subjective: Patient says she does not feel well.  Cannot characterize symptoms further  Objective: Filed Vitals:   04/18/14 0800  BP: 141/55  Pulse: 74  Temp: 98.5 F (36.9 C)  Resp: 17    Intake/Output Summary (Last 24 hours) at 04/18/14 1032 Last data filed at 04/18/14 0600  Gross per 24 hour  Intake 1442.5 ml  Output      0 ml  Net 1442.5 ml   Filed Weights   04/17/14 0738 04/18/14 0500  Weight: 61.4 kg (135 lb 5.8 oz) 62.1 kg (136 lb 14.5 oz)    Exam:   General:  NAD  Cardiovascular: S1, S2 tachycardic  Respiratory: CTA B  Abdomen: soft,  nt, nd, bs+  Musculoskeletal: no LE edema   Data Reviewed: Basic Metabolic Panel:  Recent Labs Lab 04/16/14 2237 04/17/14 0749 04/18/14 0738  NA 131* 137 138  K 3.9 3.5* 4.9  CL 93* 99 103  CO2 _0 GLUCOSE 256* 128* 88  BUN _1 CREATININE 0.72 0.68 0.62  CALCIUM 9.3 9.1 9.2  MG  --  1.6 1.8   Liver Function Tests:  Recent Labs Lab 04/16/14 2237 04/18/14 0738  AST 52* 49*  ALT 37* 33  ALKPHOS 738* 692*  BILITOT 0.5 0.5  PROT 7.3 7.0  ALBUMIN 2.5* 2.5*   No results found for this basename: LIPASE, AMYLASE,  in the last 168 hours No results found for this basename: AMMONIA,  in the last 168 hours CBC:  Recent Labs Lab 04/16/14 2237 04/18/14 0738  WBC 5.1 5.4  NEUTROABS 2.6  --   HGB 11.0* 10.2*  HCT 33.2* 30.5*  MCV 98.8 99.7  PLT 310 328   Cardiac Enzymes:  Recent Labs Lab 04/16/14 2249 04/17/14 0128  TROPONINI <0.30 <0.30   BNP (last 3 results)  Recent Labs  02/18/14 0724 03/21/14 1741 04/17/14 0749  PROBNP 285.7 556.1* 666.2*   CBG:  Recent Labs Lab 04/17/14 0912 04/17/14 1205 04/17/14 1653 04/17/14 2059 04/18/14 0754  GLUCAP 118* 140* 135* 154* 88    Recent Results (from the past 240 hour(s))  MRSA PCR SCREENING     Status: None   Collection Time    04/17/14  9:25 AM  Result Value Ref Range Status   MRSA by PCR NEGATIVE  NEGATIVE Final   Comment:            The GeneXpert MRSA Assay (FDA     approved for NASAL specimens     only), is one component of a     comprehensive MRSA colonization     surveillance program. It is not     intended to diagnose MRSA     infection nor to guide or     monitor treatment for     MRSA infections.     Studies: Nm Pulmonary Perf And Vent  04/17/2014   CLINICAL DATA:  Chest pain.  EXAM: NUCLEAR MEDICINE VENTILATION - PERFUSION LUNG SCAN  TECHNIQUE: Ventilation images were obtained in multiple projections using inhaled aerosol technetium 99 M DTPA. Perfusion images were obtained  in multiple projections after intravenous injection of Tc-20mMAA.  RADIOPHARMACEUTICALS:  40 mCi Tc-96mTPA aerosol and 6 mCi Tc-9945mA  COMPARISON:  Chest x-ray 06/16/2014.  FINDINGS: Ventilation: Small patchy ventilatory defects.  Perfusion: Small patchy perfusion defects smaller than ventilatory defect, no prominent perfusion mismatch.  IMPRESSION: Low probability pulmonary embolus.   Electronically Signed   By: ThoHawthorneOn: 04/17/2014 10:15   Dg Chest Port 1 View  04/16/2014   CLINICAL DATA:  Chest pain.  EXAM: PORTABLE CHEST - 1 VIEW  COMPARISON:  03/24/2014  FINDINGS: Unchanged convexity of the upper mediastinum, vascular based on chest CT 06/23/2013. Mild cardiomegaly which is chronic.  Decreased but persistent diffuse interstitial coarsening. Persistent asymmetric streaky density at the right base. No evidence of effusion or pneumothorax. Left mastectomy.  IMPRESSION: 1. Interstitial coarsening which could be congestive or bronchitic. 2. Mild improvement in a right basilar opacity, possible clearing pneumonia or asymmetric edema.   Electronically Signed   By: JonJorje GuildD.   On: 04/16/2014 22:31    Scheduled Meds: . atorvastatin  10 mg Oral Daily  . cholecalciferol  1,000 Units Oral q morning - 10a  . clopidogrel  75 mg Oral Q breakfast  . docusate sodium  100 mg Oral BID  . enoxaparin (LOVENOX) injection  40 mg Subcutaneous Q24H  . feeding supplement (GLUCERNA SHAKE)  237 mL Oral TID BM  . furosemide  40 mg Oral Daily  . insulin aspart  0-5 Units Subcutaneous QHS  . insulin aspart  0-9 Units Subcutaneous TID WC  . insulin glargine  10 Units Subcutaneous QHS  . metoprolol succinate  50 mg Oral Daily  . mirtazapine  30 mg Oral QHS  . pantoprazole  80 mg Oral Q1200  . polyethylene glycol  17 g Oral Daily  . spironolactone  25 mg Oral Daily   Continuous Infusions: . 0.9 % NaCl with KCl 20 mEq / L 70 mL/hr at 04/18/14 0600    Principal Problem:   PSVT (paroxysmal  supraventricular tachycardia) Active Problems:   DIABETES MELLITUS, TYPE II, UNCONTROLLED   HYPERTENSION   CORONARY ATHEROSCLEROSIS NATIVE CORONARY ARTERY   Hyponatremia   Elevated transaminase level   Chest pain   Pressure ulcer, heel, right, unstageable   Anemia    Time spent: 20m35m   Jehanzeb Memon  [facility] Pager [phone] 7PM-7AM, please contact night-coverage at [email or web address], password [email or web address], 10:32 AM  LOS: 2 days

## 2014-04-18 NOTE — Progress Notes (Signed)
PT Cancellation Note  Patient Details Name: [name] MRN: [id number] DOB: 18-Oct-1931   Cancelled Treatment:     Patient is in sinus tach and HR is at 86% of max.  Hold treatment today, and will re-assess on 04/19/14.    Leeroy Cha 04/18/2014, 11:48 AM

## 2014-04-18 NOTE — Consult Note (Signed)
Reason for Consult:chest pain Referring Physician: PTH Cardiologist: Armani Gawlik is an 80 y.o. female.  HPI:  This is an 80 year old female patient of Dr. Lonna Cobb who recently had right hip surgery on 03/28/14 who was transferred to the [facility] center for rehabilitation following discharge. She was readmitted yesterday with recurrent chest pain described as a smothering sensation and palpitations and was found to be in SVT. She converted with Cardizem. She has a history of nonobstructive CAD on cath in 2011. She also has a history of SVT treated with beta blocker, chronic diastolic heart failure, diabetes mellitus, hypertension. Last 2-D echo in 02/17/14 EF 65-70% with grade 1 diastolic dysfunction elevated LVOT gradient of 2.9 m/s most likely related to vigorous LV function in association with proximal septal thickening. She also has severe bilateral carotid artery disease with total occlusions managed with statin therapy and antiplatelet management.  Troponins negative. BNP 662. EKG's showed SVT at 138/m. VQ scan  low probability of PE. Chest x-ray interstitial coarsening question congestive versus bronchitic. Mild improvement in right basilar opacity possible clearing of pneumonia or asymmetric edema.  Patient states she was lying in bed when she developed a smothering sensation and palpitations. She's had no exertional chest tightness, pressure, dyspnea,, dizziness or presyncope.  Past Medical History  Diagnosis Date  . History of GI bleed   . Parotid tumor   . History of breast cancer   . Type 2 diabetes mellitus   . Osteopenia   . Essential hypertension, benign   . Hyperlipidemia   . Depression   . Anxiety   . Chronic headaches   . Carotid artery disease     Bilateral ICA occlusion  . Orthostatic hypotension     Diabetic polyneuropathy/diabetic dysautonomia   . Coronary atherosclerosis of native coronary artery     Mild nonobstructive disease at cardiac catheterization May  2011  . PSVT (paroxysmal supraventricular tachycardia)   . Acute diastolic congestive heart failure 03/21/2014  . Fracture of femoral neck, right, closed 03/28/2014  . Acute delirium 06/23/2013  . Syncope and collapse 04/15/2010    Qualifier: Diagnosis of  By: Angelena Form, MD, Harrell Gave    . Pressure ulcer, heel, right, unstageable 04/17/2014    Past Surgical History  Procedure Laterality Date  . Mastectomy  1994    Left breast -related to breast cancer  . Vesicovaginal fistula closure w/ tah  1979  . Cholecystectomy  2008  . Hemorrhoid surgery  1960s  . Laparoscopic appendectomy N/A 04/13/2013    Procedure: APPENDECTOMY LAPAROSCOPIC;  Surgeon: Donato Heinz, MD;  Location: [facility];  Service: General;  Laterality: N/A;  . Colonoscopy  2010    Dr. Oneida Alar: single tubular adenoma, one hyperplastic polyp. Next TCS 2020 health permitting.  . Hip arthroplasty Right 03/28/2014    Procedure: ARTHROPLASTY BIPOLAR HIP;  Surgeon: Carole Civil, MD;  Location: [facility];  Service: Orthopedics;  Laterality: Right;    Family History  Problem Relation Age of Onset  . Heart failure Mother   . Cirrhosis Brother   . Lung cancer Father     Brain METS  . Kidney cancer Mother   . Kidney cancer Sister     Social History:  reports that she quit smoking about 12 months ago. Her smoking use included Cigarettes. She has a 20 pack-year smoking history. She does not have any smokeless tobacco history on file. She reports that she does not drink alcohol or use illicit drugs.  Allergies:  Allergies  Allergen Reactions  . Aricept [Donepezil Hcl]     Hives, vomiting and headache  . Namenda [Memantine Hcl]     hives  . Codeine   . Latex   . Penicillins Rash    Medications: Scheduled Meds: . atorvastatin  10 mg Oral Daily  . cholecalciferol  1,000 Units Oral q morning - 10a  . clopidogrel  75 mg Oral Q breakfast  . docusate sodium  100 mg Oral BID  . enoxaparin (LOVENOX) injection  40 mg Subcutaneous  Q24H  . feeding supplement (GLUCERNA SHAKE)  237 mL Oral TID BM  . furosemide  40 mg Oral Daily  . insulin aspart  0-5 Units Subcutaneous QHS  . insulin aspart  0-9 Units Subcutaneous TID WC  . insulin glargine  10 Units Subcutaneous QHS  . metoprolol succinate  50 mg Oral Daily  . mirtazapine  30 mg Oral QHS  . pantoprazole  80 mg Oral Q1200  . polyethylene glycol  17 g Oral Daily  . spironolactone  25 mg Oral Daily   Continuous Infusions: . 0.9 % NaCl with KCl 20 mEq / L 70 mL/hr at 04/18/14 0600   PRN Meds:.acetaminophen, acetaminophen, albuterol, ALPRAZolam, alum & mag hydroxide-simeth, HYDROcodone-acetaminophen, ondansetron (ZOFRAN) IV, ondansetron, tiotropium   Results for orders placed during the hospital encounter of 04/16/14 (from the past 48 hour(s))  CBC WITH DIFFERENTIAL     Status: Abnormal   Collection Time    04/16/14 10:37 PM      Result Value Ref Range   WBC 5.1  4.0 - 10.5 K/uL   RBC 3.36 (*) 3.87 - 5.11 MIL/uL   Hemoglobin 11.0 (*) 12.0 - 15.0 g/dL   HCT 33.2 (*) 36.0 - 46.0 %   MCV 98.8  78.0 - 100.0 fL   MCH 32.7  26.0 - 34.0 pg   MCHC 33.1  30.0 - 36.0 g/dL   RDW 13.6  11.5 - 15.5 %   Platelets 310  150 - 400 K/uL   Neutrophils Relative % 50  43 - 77 %   Neutro Abs 2.6  1.7 - 7.7 K/uL   Lymphocytes Relative 31  12 - 46 %   Lymphs Abs 1.6  0.7 - 4.0 K/uL   Monocytes Relative 9  3 - 12 %   Monocytes Absolute 0.5  0.1 - 1.0 K/uL   Eosinophils Relative 8 (*) 0 - 5 %   Eosinophils Absolute 0.4  0.0 - 0.7 K/uL   Basophils Relative 2 (*) 0 - 1 %   Basophils Absolute 0.1  0.0 - 0.1 K/uL  COMPREHENSIVE METABOLIC PANEL     Status: Abnormal   Collection Time    04/16/14 10:37 PM      Result Value Ref Range   Sodium 131 (*) 137 - 147 mEq/L   Potassium 3.9  3.7 - 5.3 mEq/L   Chloride 93 (*) 96 - 112 mEq/L   CO2 22  19 - 32 mEq/L   Glucose, Bld 256 (*) 70 - 99 mg/dL   BUN 11  6 - 23 mg/dL   Creatinine, Ser 0.72  0.50 - 1.10 mg/dL   Calcium 9.3  8.4 - 10.5  mg/dL   Total Protein 7.3  6.0 - 8.3 g/dL   Albumin 2.5 (*) 3.5 - 5.2 g/dL   AST 52 (*) 0 - 37 U/L   ALT 37 (*) 0 - 35 U/L   Alkaline Phosphatase 738 (*) 39 - 117 U/L   Total Bilirubin 0.5  0.3 - 1.2 mg/dL   GFR calc non Af Amer 78 (*) >90 mL/min   GFR calc Af Amer >90  >90 mL/min   Comment: (NOTE)     The eGFR has been calculated using the CKD EPI equation.     This calculation has not been validated in all clinical situations.     eGFR's persistently <90 mL/min signify possible Chronic Kidney     Disease.  TROPONIN I     Status: None   Collection Time    04/16/14 10:49 PM      Result Value Ref Range   Troponin I <0.30  <0.30 ng/mL   Comment:            Due to the release kinetics of cTnI,     a negative result within the first hours     of the onset of symptoms does not rule out     myocardial infarction with certainty.     If myocardial infarction is still suspected,     repeat the test at appropriate intervals.  TROPONIN I     Status: None   Collection Time    04/17/14  1:28 AM      Result Value Ref Range   Troponin I <0.30  <0.30 ng/mL   Comment:            Due to the release kinetics of cTnI,     a negative result within the first hours     of the onset of symptoms does not rule out     myocardial infarction with certainty.     If myocardial infarction is still suspected,     repeat the test at appropriate intervals.  BASIC METABOLIC PANEL     Status: Abnormal   Collection Time    04/17/14  7:49 AM      Result Value Ref Range   Sodium 137  137 - 147 mEq/L   Potassium 3.5 (*) 3.7 - 5.3 mEq/L   Chloride 99  96 - 112 mEq/L   CO2 25  19 - 32 mEq/L   Glucose, Bld 128 (*) 70 - 99 mg/dL   BUN 8  6 - 23 mg/dL   Creatinine, Ser 0.68  0.50 - 1.10 mg/dL   Calcium 9.1  8.4 - 10.5 mg/dL   GFR calc non Af Amer 80 (*) >90 mL/min   GFR calc Af Amer >90  >90 mL/min   Comment: (NOTE)     The eGFR has been calculated using the CKD EPI equation.     This calculation has not  been validated in all clinical situations.     eGFR's persistently <90 mL/min signify possible Chronic Kidney     Disease.  MAGNESIUM     Status: None   Collection Time    04/17/14  7:49 AM      Result Value Ref Range   Magnesium 1.6  1.5 - 2.5 mg/dL  TSH     Status: None   Collection Time    04/17/14  7:49 AM      Result Value Ref Range   TSH 1.350  0.350 - 4.500 uIU/mL   Comment: Performed at [facility]  T4, FREE     Status: None   Collection Time    04/17/14  7:49 AM      Result Value Ref Range   Free T4 1.41  0.80 - 1.80 ng/dL   Comment: Performed at [facility]  NATRIURETIC PEPTIDE     Status: Abnormal   Collection Time    04/17/14  7:49 AM      Result Value Ref Range   Pro B Natriuretic peptide (BNP) 666.2 (*) 0 - 450 pg/mL  HEMOGLOBIN A1C     Status: Abnormal   Collection Time    04/17/14  8:35 AM      Result Value Ref Range   Hemoglobin A1C 8.4 (*) <5.7 %   Comment: (NOTE)                                                                               According to the ADA Clinical Practice Recommendations for 2011, when     HbA1c is used as a screening test:      >=6.5%   Diagnostic of Diabetes Mellitus               (if abnormal result is confirmed)     5.7-6.4%   Increased risk of developing Diabetes Mellitus     References:Diagnosis and Classification of Diabetes Mellitus,Diabetes     HTXH,7414,23(TRVUY 1):S62-S69 and Standards of Medical Care in             Diabetes - 2011,Diabetes Care,2011,34 (Suppl 1):S11-S61.   Mean Plasma Glucose 194 (*) <117 mg/dL   Comment: Performed at [place], [place]     Status: Abnormal   Collection Time    04/17/14  9:12 AM      Result Value Ref Range   Glucose-Capillary 118 (*) 70 - 99 mg/dL   Comment 1 Documented in Chart     Comment 2 Notify RN    MRSA PCR SCREENING     Status: None   Collection Time    04/17/14  9:25 AM      Result Value Ref Range   MRSA by PCR NEGATIVE   NEGATIVE   Comment:            The GeneXpert MRSA Assay (FDA     approved for NASAL specimens     only), is one component of a     comprehensive MRSA colonization     surveillance program. It is not     intended to diagnose MRSA     infection nor to guide or     monitor treatment for     MRSA infections.  GLUCOSE, CAPILLARY     Status: Abnormal   Collection Time    04/17/14 12:05 PM      Result Value Ref Range   Glucose-Capillary 140 (*) 70 - 99 mg/dL  GLUCOSE, CAPILLARY     Status: Abnormal   Collection Time    04/17/14  4:53 PM      Result Value Ref Range   Glucose-Capillary 135 (*) 70 - 99 mg/dL   Comment 1 Documented in Chart     Comment 2 Notify RN    GLUCOSE, CAPILLARY     Status: Abnormal   Collection Time    04/17/14  8:59 PM      Result Value Ref Range   Glucose-Capillary 154 (*) 70 - 99 mg/dL  BASIC METABOLIC PANEL     Status: Abnormal  Collection Time    04/18/14  7:38 AM      Result Value Ref Range   Sodium 138  137 - 147 mEq/L   Potassium 4.9  3.7 - 5.3 mEq/L   Comment: DELTA CHECK NOTED   Chloride 103  96 - 112 mEq/L   CO2 23  19 - 32 mEq/L   Glucose, Bld 88  70 - 99 mg/dL   BUN 7  6 - 23 mg/dL   Creatinine, Ser 0.62  0.50 - 1.10 mg/dL   Calcium 9.2  8.4 - 10.5 mg/dL   GFR calc non Af Amer 82 (*) >90 mL/min   GFR calc Af Amer >90  >90 mL/min   Comment: (NOTE)     The eGFR has been calculated using the CKD EPI equation.     This calculation has not been validated in all clinical situations.     eGFR's persistently <90 mL/min signify possible Chronic Kidney     Disease.  HEPATIC FUNCTION PANEL     Status: Abnormal   Collection Time    04/18/14  7:38 AM      Result Value Ref Range   Total Protein 7.0  6.0 - 8.3 g/dL   Albumin 2.5 (*) 3.5 - 5.2 g/dL   AST 49 (*) 0 - 37 U/L   ALT 33  0 - 35 U/L   Alkaline Phosphatase 692 (*) 39 - 117 U/L   Total Bilirubin 0.5  0.3 - 1.2 mg/dL   Bilirubin, Direct <0.2  0.0 - 0.3 mg/dL   Indirect Bilirubin NOT  CALCULATED  0.3 - 0.9 mg/dL  CBC     Status: Abnormal   Collection Time    04/18/14  7:38 AM      Result Value Ref Range   WBC 5.4  4.0 - 10.5 K/uL   RBC 3.06 (*) 3.87 - 5.11 MIL/uL   Hemoglobin 10.2 (*) 12.0 - 15.0 g/dL   HCT 30.5 (*) 36.0 - 46.0 %   MCV 99.7  78.0 - 100.0 fL   MCH 33.3  26.0 - 34.0 pg   MCHC 33.4  30.0 - 36.0 g/dL   RDW 13.9  11.5 - 15.5 %   Platelets 328  150 - 400 K/uL  MAGNESIUM     Status: None   Collection Time    04/18/14  7:38 AM      Result Value Ref Range   Magnesium 1.8  1.5 - 2.5 mg/dL  GLUCOSE, CAPILLARY     Status: None   Collection Time    04/18/14  7:54 AM      Result Value Ref Range   Glucose-Capillary 88  70 - 99 mg/dL  GLUCOSE, CAPILLARY     Status: Abnormal   Collection Time    04/18/14 11:18 AM      Result Value Ref Range   Glucose-Capillary 205 (*) 70 - 99 mg/dL   Comment 1 Documented in Chart     Comment 2 Notify RN      Nm Pulmonary Perf And Vent  04/17/2014   CLINICAL DATA:  Chest pain.  EXAM: NUCLEAR MEDICINE VENTILATION - PERFUSION LUNG SCAN  TECHNIQUE: Ventilation images were obtained in multiple projections using inhaled aerosol technetium 99 M DTPA. Perfusion images were obtained in multiple projections after intravenous injection of Tc-28mMAA.  RADIOPHARMACEUTICALS:  40 mCi Tc-976mTPA aerosol and 6 mCi Tc-997mA  COMPARISON:  Chest x-ray 06/16/2014.  FINDINGS: Ventilation: Small patchy ventilatory defects.  Perfusion: Small patchy  perfusion defects smaller than ventilatory defect, no prominent perfusion mismatch.  IMPRESSION: Low probability pulmonary embolus.   Electronically Signed   By: [place]   On: 04/17/2014 10:15   Dg Chest Port 1 View  04/16/2014   CLINICAL DATA:  Chest pain.  EXAM: PORTABLE CHEST - 1 VIEW  COMPARISON:  03/24/2014  FINDINGS: Unchanged convexity of the upper mediastinum, vascular based on chest CT 06/23/2013. Mild cardiomegaly which is chronic.  Decreased but persistent diffuse interstitial  coarsening. Persistent asymmetric streaky density at the right base. No evidence of effusion or pneumothorax. Left mastectomy.  IMPRESSION: 1. Interstitial coarsening which could be congestive or bronchitic. 2. Mild improvement in a right basilar opacity, possible clearing pneumonia or asymmetric edema.   Electronically Signed   By: Jorje Guild M.D.   On: 04/16/2014 22:31    ROS See HPI Eyes: Negative Ears: Positive for hearing loss, negative tinnitus Cardiovascular: Positive for chest pain, palpitations,irregular heartbeat, negative for dyspnea, dyspnea on exertion, near-syncope, orthopnea, paroxysmal nocturnal dyspnea and syncope,edema, claudication, cyanosis,.  Respiratory:   Negative for cough, hemoptysis, shortness of breath, sleep disturbances due to breathing, sputum production and wheezing.   Endocrine: Negative for cold intolerance and heat intolerance.  Hematologic/Lymphatic: Negative for adenopathy and bleeding problem. Does not bruise/bleed easily.  Musculoskeletal: Negative.   Gastrointestinal: Negative for nausea, vomiting, reflux, abdominal pain, diarrhea, constipation.   Genitourinary: Negative for bladder incontinence, dysuria, flank pain,  hematuria,   Neurological: History of dementia.  Allergic/Immunologic: Negative for environmental allergies.  Blood pressure 141/55, pulse 74, temperature 98.5 F (36.9 C), temperature source Oral, resp. rate 17, height _0  (1.626 m), weight 136 lb 14.5 oz (62.1 kg), SpO2 94.00%. Physical Exam PHYSICAL EXAM: Well-nournished, in no acute distress. Neck: No JVD, HJR, Bruit, or thyroid enlargement Lungs: Decreased breath sounds throughout but No tachypnea, clear without wheezing, rales, or rhonchi Cardiovascular: RRR, PMI not displaced, 2/6 systolic murmur at the left sternal border and apex, no bruit, thrill, or heave. Abdomen: BS normal. Soft without organomegaly, masses, lesions or tenderness. Extremities: Right foot for ulcers  otherwise lower extremities without cyanosis, clubbing or edema. Decreased distal pulses bilateral SKin: Warm, no lesions or rashes  Musculoskeletal: No deformities Neuro: no focal signs   Echocardiogram (02/17/2014): Study Conclusions  - Left ventricle: The cavity size was normal. Wall thickness was increased in a pattern of severe LVH. There was mild focal basal hypertrophy of the septum. Systolic function was vigorous. The estimated ejection fraction was in the range of 65% to 70%. Wall motion was normal; there were no regional wall motion abnormalities. Doppler parameters are consistent with abnormal left ventricular relaxation (grade 1 diastolic dysfunction). - Left atrium: The atrium was mildly dilated. - Tricuspid valve: Moderate regurgitation. - Pulmonary arteries: Systolic pressure was mildly increased. Impressions:  - Elevated LVOT gradient of 2.9 m/s most likely related to vigorous LV function in association with proximal septal thickening.    Assessment/Plan: Chest pain MI ruled out with negative troponins. EKG showed SVT. History of nonobstructive disease in 2011. Chest pain/smothering sensation most likely due to SVT. Will increase Toprol 31m BID and continue to monitor.  SVT converted to normal sinus rhythm with Cardizem in the emergency room. Patient has history of SVT treated with Toprol. Will increase dose.  Chronic diastolic heart failure with normal LV function EF 65-70% on echo in 02/2014. Currently compensated on Lasix, aldactone.  [facility]  Bilateral ICA occlusion treated medically  with statins and antiplatelet therapy(Plavix)  Hypertension BP stable.  Type 2 diabetes mellitus  Dementia  Imogene Burn 04/18/2014, 12:40 PM

## 2014-04-19 ENCOUNTER — Inpatient Hospital Stay (HOSPITAL_COMMUNITY): Payer: Medicare Other

## 2014-04-19 DIAGNOSIS — Z471 Aftercare following joint replacement surgery: Secondary | ICD-10-CM | POA: Diagnosis not present

## 2014-04-19 DIAGNOSIS — L89609 Pressure ulcer of unspecified heel, unspecified stage: Secondary | ICD-10-CM

## 2014-04-19 DIAGNOSIS — L8995 Pressure ulcer of unspecified site, unstageable: Secondary | ICD-10-CM

## 2014-04-19 DIAGNOSIS — Z96649 Presence of unspecified artificial hip joint: Secondary | ICD-10-CM | POA: Diagnosis not present

## 2014-04-19 DIAGNOSIS — I471 Supraventricular tachycardia: Secondary | ICD-10-CM | POA: Diagnosis not present

## 2014-04-19 DIAGNOSIS — I10 Essential (primary) hypertension: Secondary | ICD-10-CM | POA: Diagnosis not present

## 2014-04-19 DIAGNOSIS — R079 Chest pain, unspecified: Secondary | ICD-10-CM | POA: Diagnosis not present

## 2014-04-19 DIAGNOSIS — I2510 Atherosclerotic heart disease of native coronary artery without angina pectoris: Secondary | ICD-10-CM | POA: Diagnosis not present

## 2014-04-19 DIAGNOSIS — I779 Disorder of arteries and arterioles, unspecified: Secondary | ICD-10-CM | POA: Diagnosis not present

## 2014-04-19 LAB — BASIC METABOLIC PANEL
BUN: 7 mg/dL (ref 6–23)
CO2: 23 mEq/L (ref 19–32)
Calcium: 9.5 mg/dL (ref 8.4–10.5)
Chloride: 103 mEq/L (ref 96–112)
Creatinine, Ser: 0.65 mg/dL (ref 0.50–1.10)
GFR calc Af Amer: >90 mL/min (ref >90)
GFR calc non Af Amer: 81 mL/min — ABNORMAL LOW (ref >90)
Glucose, Bld: 137 mg/dL — ABNORMAL HIGH (ref 70–99)
Potassium: 4.5 mEq/L (ref 3.7–5.3)
Sodium: 140 mEq/L (ref 137–147)

## 2014-04-19 LAB — GLUCOSE, CAPILLARY
Glucose-Capillary: 123 mg/dL — ABNORMAL HIGH (ref 70–99)
Glucose-Capillary: 145 mg/dL — ABNORMAL HIGH (ref 70–99)
Glucose-Capillary: 280 mg/dL — ABNORMAL HIGH (ref 70–99)
Glucose-Capillary: 218 mg/dL — ABNORMAL HIGH (ref 70–99)

## 2014-04-19 LAB — CBC
HCT: 33 % — ABNORMAL LOW (ref 36.0–46.0)
Hemoglobin: 10.9 g/dL — ABNORMAL LOW (ref 12.0–15.0)
MCH: 32.8 pg (ref 26.0–34.0)
MCHC: 33 g/dL (ref 30.0–36.0)
MCV: 99.4 fL (ref 78.0–100.0)
Platelets: 366 10*3/uL (ref 150–400)
RBC: 3.32 MIL/uL — ABNORMAL LOW (ref 3.87–5.11)
RDW: 13.9 % (ref 11.5–15.5)
WBC: 4.8 10*3/uL (ref 4.0–10.5)

## 2014-04-19 NOTE — Progress Notes (Signed)
PT Cancellation Note  Patient Details Name: DRINA JOBST MRN: [id number] DOB: 08/25/1931   Cancelled Treatment:     PT order received for evaluation.  Attempted evaluation, however patient not in room and currently undergoing x-ray.  Will attempt evaluation at a later date.    Colon Flattery Ernestine Conrad 04/19/2014, 10:17 AM

## 2014-04-19 NOTE — Progress Notes (Signed)
[facility] PROGRESS NOTE  [name] KPT:[id number] DOB: [birth year] DOA: 04/16/2014 PCP: [name], MD  Assessment/Plan: 1. Chest pain. Possibly related to PSVT. Patient was given diltiazem in the emergency room with resolution. She's had recurrent tachycardia and recurrent chest discomfort. She is on Toprol once daily. This may need to be further adjusted. VQ scan is low probability for pulmonary embolus. She does have a history of nonobstructive coronary disease. She's had a recent echocardiogram. Cardiology input appreciated.  Metoprolol was increased from 2m daily to 545mbid.  Will continue to follow.  Will observe heart rate response to exertion when seen by physical therapy. 2. Hypertension. Blood pressure is stable. She is continued on Toprol and Aldactone, Lasix. Lisinopril was held on admission. We'll hold off on resuming lisinopril for now, in case rate control needs to be adjusted. 3. Mild diastolic dysfunction. Lasix currently on hold. She does not appear to be volume overloaded. 4. Type 2 diabetes. Continue Lantus and sliding-scale NovoLog. 5. Status post right hip surgery. Physical therapy consultation. Patient describes ongoing pain in her right hip.  She had followed up with Dr. HaAline Brochureho had ordered repeat xray of hip.  This was not done prior to admission, so we will pursue this now. 6. Right heel pressure ulcer. Appreciate wound care recommendations.  Code Status: full code Family Communication: discussed with patient and daughters at the bedside Disposition Plan: pending physical therapy evaluation   Consultants:  Cardiology  Procedures:    Antibiotics:    HPI/Subjective: Reports that she is feeling better. No chest pain or shortness of breath  Objective: Filed Vitals:   04/19/14 0900  BP: 140/63  Pulse: 64  Temp:   Resp: 16    Intake/Output Summary (Last 24 hours) at 04/19/14 1031 Last data filed at 04/19/14 0900  Gross per 24 hour   Intake   1730 ml  Output      0 ml  Net   1730 ml   Filed Weights   04/17/14 0738 04/18/14 0500 04/19/14 0500  Weight: 61.4 kg (135 lb 5.8 oz) 62.1 kg (136 lb 14.5 oz) 61.2 kg (134 lb 14.7 oz)    Exam:   General:  NAD  Cardiovascular: S1, S2 RRR  Respiratory: CTA B  Abdomen: soft, nt, nd, bs+  Musculoskeletal: no LE edema   Data Reviewed: Basic Metabolic Panel:  Recent Labs Lab 04/16/14 2237 04/17/14 0749 04/18/14 0738 04/19/14 0808  NA 131* 137 138 140  K 3.9 3.5* 4.9 4.5  CL 93* 99 103 103  CO2 _0 GLUCOSE 256* 128* 88 137*  BUN _1 CREATININE 0.72 0.68 0.62 0.65  CALCIUM 9.3 9.1 9.2 9.5  MG  --  1.6 1.8  --    Liver Function Tests:  Recent Labs Lab 04/16/14 2237 04/18/14 0738  AST 52* 49*  ALT 37* 33  ALKPHOS 738* 692*  BILITOT 0.5 0.5  PROT 7.3 7.0  ALBUMIN 2.5* 2.5*   No results found for this basename: LIPASE, AMYLASE,  in the last 168 hours No results found for this basename: AMMONIA,  in the last 168 hours CBC:  Recent Labs Lab 04/16/14 2237 04/18/14 0738 04/19/14 0808  WBC 5.1 5.4 4.8  NEUTROABS 2.6  --   --   HGB 11.0* 10.2* 10.9*  HCT 33.2* 30.5* 33.0*  MCV 98.8 99.7 99.4  PLT 310 328 366   Cardiac Enzymes:  Recent Labs Lab 04/16/14 2249 04/17/14 0128  TROPONINI <0.30 <0.30   BNP (last 3 results)  Recent Labs  02/18/14 0724 03/21/14 1741 04/17/14 0749  PROBNP 285.7 556.1* 666.2*   CBG:  Recent Labs Lab 04/18/14 0754 04/18/14 1118 04/18/14 1636 04/18/14 2102 04/19/14 0812  GLUCAP 88 205* 169* 157* 123*    Recent Results (from the past 240 hour(s))  MRSA PCR SCREENING     Status: None   Collection Time    04/17/14  9:25 AM      Result Value Ref Range Status   MRSA by PCR NEGATIVE  NEGATIVE Final   Comment:            The GeneXpert MRSA Assay (FDA     approved for NASAL specimens     only), is one component of a     comprehensive MRSA colonization     surveillance program. It is not      intended to diagnose MRSA     infection nor to guide or     monitor treatment for     MRSA infections.     Studies: No results found.  Scheduled Meds: . atorvastatin  10 mg Oral Daily  . cholecalciferol  1,000 Units Oral q morning - 10a  . clopidogrel  75 mg Oral Q breakfast  . docusate sodium  100 mg Oral BID  . enoxaparin (LOVENOX) injection  40 mg Subcutaneous Q24H  . feeding supplement (GLUCERNA SHAKE)  237 mL Oral TID BM  . furosemide  40 mg Oral Daily  . insulin aspart  0-5 Units Subcutaneous QHS  . insulin aspart  0-9 Units Subcutaneous TID WC  . insulin glargine  10 Units Subcutaneous QHS  . metoprolol tartrate  50 mg Oral BID  . mirtazapine  30 mg Oral QHS  . pantoprazole  80 mg Oral Q1200  . polyethylene glycol  17 g Oral Daily  . spironolactone  25 mg Oral Daily   Continuous Infusions:    Principal Problem:   PSVT (paroxysmal supraventricular tachycardia) Active Problems:   DIABETES MELLITUS, TYPE II, UNCONTROLLED   HYPERTENSION   CORONARY ATHEROSCLEROSIS NATIVE CORONARY ARTERY   Hyponatremia   Elevated transaminase level   Chest pain   Pressure ulcer, heel, right, unstageable   Anemia    Time spent: 56mns    Raylin Winer  [facility] Pager [phone] If 7PM-7AM, please contact night-coverage at [email or web address], password [email or web address]6/02/2014, 10:31 AM  LOS: 3 days

## 2014-04-19 NOTE — Progress Notes (Signed)
Consulting cardiologist:Koneswaran, Jamesetta So MD Primary Cardiologist: Lauree Chandler MD  Subjective:    No complaints. "I feel fine." No more smothering feeling.  Objective:   Temp:  [98 F (36.7 C)-98.8 F (37.1 C)] 98.6 F (37 C) (06/04 0015) Pulse Rate:  [68-75] 68 (06/04 0400) Resp:  [16-24] 21 (06/04 0400) BP: (100-140)/(28-111) 136/56 mmHg (06/04 0400) SpO2:  [94 %-95 %] 95 % (06/04 0400) Weight:  [134 lb 14.7 oz (61.2 kg)] 134 lb 14.7 oz (61.2 kg) (06/04 0500) Last BM Date: 04/17/14  Filed Weights   04/17/14 0738 04/18/14 0500 04/19/14 0500  Weight: 135 lb 5.8 oz (61.4 kg) 136 lb 14.5 oz (62.1 kg) 134 lb 14.7 oz (61.2 kg)    Intake/Output Summary (Last 24 hours) at 04/19/14 0819 Last data filed at 04/19/14 0400  Gross per 24 hour  Intake   1520 ml  Output      0 ml  Net   1520 ml    Telemetry:NSR in the 70's. No SVT or tachycardia.  Exam:  General: No acute distress.  HEENT: Conjunctiva and lids normal, oropharynx clear.  Lungs: Clear to auscultation, nonlabored.  Cardiac: No elevated JVP or bruits. RRR, no gallop or rub.   Abdomen: Normoactive bowel sounds, nontender, nondistended.  Extremities: No pitting edema, distal pulses full.  Neuropsychiatric: Alert and oriented x3, affect appropriate.   Lab Results:  Basic Metabolic Panel:  Recent Labs Lab 04/16/14 2237 04/17/14 0749 04/18/14 0738  NA 131* 137 138  K 3.9 3.5* 4.9  CL 93* 99 103  CO2 _0 GLUCOSE 256* 128* 88  BUN _1 CREATININE 0.72 0.68 0.62  CALCIUM 9.3 9.1 9.2  MG  --  1.6 1.8    Liver Function Tests:  Recent Labs Lab 04/16/14 2237 04/18/14 0738  AST 52* 49*  ALT 37* 33  ALKPHOS 738* 692*  BILITOT 0.5 0.5  PROT 7.3 7.0  ALBUMIN 2.5* 2.5*    CBC:  Recent Labs Lab 04/16/14 2237 04/18/14 0738  WBC 5.1 5.4  HGB 11.0* 10.2*  HCT 33.2* 30.5*  MCV 98.8 99.7  PLT 310 328    Cardiac Enzymes:  Recent Labs Lab 04/16/14 2249  04/17/14 0128  TROPONINI <0.30 <0.30    BNP:  Recent Labs  02/18/14 0724 03/21/14 1741 04/17/14 0749  PROBNP 285.7 556.1* 666.2*   Radiology: Nm Pulmonary Perf And Vent  04/17/2014   CLINICAL DATA:  Chest pain.  EXAM: NUCLEAR MEDICINE VENTILATION - PERFUSION LUNG SCAN  TECHNIQUE: Ventilation images were obtained in multiple projections using inhaled aerosol technetium 99 M DTPA. Perfusion images were obtained in multiple projections after intravenous injection of Tc-22mMAA.  RADIOPHARMACEUTICALS:  40 mCi Tc-968mTPA aerosol and 6 mCi Tc-9967mA  COMPARISON:  Chest x-ray 06/16/2014.  FINDINGS: Ventilation: Small patchy ventilatory defects.  Perfusion: Small patchy perfusion defects smaller than ventilatory defect, no prominent perfusion mismatch.  IMPRESSION: Low probability pulmonary embolus.   Electronically Signed   By: ThoLulingOn: 04/17/2014 10:15      Medications:   Scheduled Medications: . atorvastatin  10 mg Oral Daily  . cholecalciferol  1,000 Units Oral q morning - 10a  . clopidogrel  75 mg Oral Q breakfast  . docusate sodium  100 mg Oral BID  . enoxaparin (LOVENOX) injection  40 mg Subcutaneous Q24H  . feeding supplement (GLUCERNA SHAKE)  237 mL Oral TID BM  . furosemide  40 mg Oral  Daily  . insulin aspart  0-5 Units Subcutaneous QHS  . insulin aspart  0-9 Units Subcutaneous TID WC  . insulin glargine  10 Units Subcutaneous QHS  . metoprolol tartrate  50 mg Oral BID  . mirtazapine  30 mg Oral QHS  . pantoprazole  80 mg Oral Q1200  . polyethylene glycol  17 g Oral Daily  . spironolactone  25 mg Oral Daily    Infusions: . 0.9 % NaCl with KCl 20 mEq / L 70 mL/hr at 04/19/14 0400    PRN Medications: acetaminophen, acetaminophen, albuterol, ALPRAZolam, alum & mag hydroxide-simeth, HYDROcodone-acetaminophen, ondansetron (ZOFRAN) IV, ondansetron, tiotropium   Assessment and Plan:   1.SVT: Resolved with IV cardiazem in ER and not placed on increased  metoprolol dose of 50 mg BID from 50 mg daily. She is asymptomatic and is much more comfortable.   2. Hypertension: Controlled. Not on ACE which is recommended in the setting of diabetes. Creatinine is 0.62. Echo demonstrated normal EF 65%-70% with grade I diastolic dysfunction with elevated LVOT gradient. Consider adding low dose for renal protection in this setting. She has a h/o orthostatic hypotension. Will defer to Dr. Greg Cutter for recommendations on follow up.   3. CAD: Cath in 2011, non-obstructive disease. Continues on statin, and BB.  4.Carotid Artery Disease: Managed medically. Now on clopidogrel 75 mg daily.   5. Chronic Diastolic CHF: Appears well compensated. On lasix 40 mg daily along with spironolactone. Potassium 4.9.  6. Recent hip repair on the right: Fx in the setting of mechanical fall. Continue PT per regimen at [facility] on discharge.   7. Anemia: Noted Hgb of 10.9 This is stable on comparison to prior lab values during hospitalization late May. But found to be higher levels at 13.9 early May.  Consider anemia profile.     Phill Myron. Saanvi Hakala NP  04/19/2014, 8:19 AM

## 2014-04-19 NOTE — Progress Notes (Signed)
The patient was seen and examined, and I agree with the assessment and plan as documented above, with modifications as noted below. Pt had an episode of SVT this morning up to the 140 bpm range which quickly resolved. Pt's daughter informed me. Pt was asymptomatic. I confirmed with nurse that pt did indeed receive 50 mg of metoprolol yesterday evening, thus increased dosing to 50 mg bid was in fact administered. I spoke with Dr. Roderic Palau. Will plan to watch her for another 24 hours and possibly discharge on 6/5. Will defer further management to [name].

## 2014-04-19 NOTE — Clinical Social Work Note (Signed)
CSW met with daughter, [name] at bedside.  Aware of possible d/c tomorrow. [name] states she will do another day bed hold at [facility]. Updated [place] on pt.   [name], [place]

## 2014-04-19 NOTE — Evaluation (Signed)
Physical Therapy Evaluation Patient Details Name: [name] MRN: [id number] DOB: [birth year] Today's Date: 04/19/2014   History of Present Illness  HPI: [name] is a 80 y.o. female with a history of PSVT, nonobstructive coronary artery disease, diabetes mellitus, and chronic diastolic heart failure. She is also status post right hip surgery on 03/28/14. She was transferred to the [facility] for rehabilitation following discharge. She presents to the hospital this morning from the [facility] with a chief complaint of chest pain. She developed chest pain earlier this morning at rest. The pain is located left of the substernal area. She describes the pain as a smothering feeling. She had associated shortness of breath and palpitations. She denies associated diaphoresis, nausea, vomiting, or radiation of the chest pain. The pain subsided before she was transferred to the ED. She is currently without any complaints of chest pain. Her review of systems is negative for fever, chills, abdominal pain, diarrhea, cough, or pain with urination. Her review of systems is positive for right hip pain, particularly when she tries to ambulate with physical therapy.  Clinical Impression  Patient requires two person max assist for all bed mobility, transfers and to maintain standing. Patient and care giver(s) are not safe to discharge patient home without 24 hour 2 person assistance. Recommending skilled nursing facility as patient demonstrated good ability to participate in therapy, and activity tolerance limited only by difficulty of exercise.    Follow Up Recommendations SNF    Equipment Recommendations  None recommended by PT       Precautions / Restrictions Precautions Precautions: Fall Restrictions Weight Bearing Restrictions: No RLE Weight Bearing: Weight bearing as tolerated      Mobility  Bed Mobility Overal bed mobility: Needs Assistance Bed Mobility: Supine to Sit;Sidelying to Sit    Sidelying to sit: Max assist Supine to sit: Max assist Sit to supine: Max assist      Transfers Overall transfer level: Needs assistance   Transfers: Sit to/from Stand Sit to Stand: Max assist;+2 physical assistance         General transfer comment: Patient unable to perform sit to stand withtou two peson assit due to LE and trunk weakness  Ambulation/Gait Ambulation/Gait assistance:  (Not tested due to inability to stand with out max two person assist)     Balance Overall balance assessment: Needs assistance Sitting-balance support: Bilateral upper extremity supported;Feet supported Sitting balance-Leahy Scale: Zero   Postural control: Posterior lean;Left lateral lean Standing balance support: Bilateral upper extremity supported Standing balance-Leahy Scale: Zero                               Pertinent Vitals/Pain No pain    Home Living Family/patient expects to be discharged to:: Skilled nursing facility Living Arrangements: Children Available Help at Discharge: Family Type of Home: House Home Access: Ramped entrance       Home Equipment: Clinical cytogeneticist - 2 wheels;Grab bars - tub/shower;Hand held shower head;Bedside commode Additional Comments: pt mainly in w/c at home and just started ambulating a few steps with HHPT.    Prior Function Level of Independence: Needs assistance   Gait / Transfers Assistance Needed: as per daughter patient  required mod A with all functional transfers since nov 2014 , was non ambulatory and more wc bounded  ADL's / Homemaking Assistance Needed: Pt's son and daughter provide assist with IADLs.  Pt was able to feed self prior to  admission an dpreofmr grooming tasks.           Extremity/Trunk Assessment               Lower Extremity Assessment: Generalized weakness            Cognition Arousal/Alertness: Awake/alert Behavior During Therapy: WFL for tasks assessed/performed Overall Cognitive  Status: History of cognitive impairments - at baseline                      General Comments      Exercises        Assessment/Plan    PT Assessment Patient needs continued PT services  PT Diagnosis Acute pain;Generalized weakness;Difficulty walking   PT Problem List Decreased strength;Decreased range of motion;Decreased activity tolerance;Decreased balance;Decreased mobility;Decreased cognition;Decreased safety awareness;Decreased knowledge of precautions;Pain  PT Treatment Interventions DME instruction;Therapeutic activities;Therapeutic exercise;Balance training;Patient/family education;Gait training   PT Goals (Current goals can be found in the Care Plan section) Acute Rehab PT Goals Patient Stated Goal: none stated PT Goal Formulation: Patient unable to participate in goal setting Time For Goal Achievement: 04/26/14 Potential to Achieve Goals: Fair    Frequency Min 3X/week   Barriers to discharge Decreased caregiver support patient will not hove two person assist 24 hours a day.    Co-evaluation               End of Session Equipment Utilized During Treatment: Oxygen Activity Tolerance: Patient limited by lethargy Patient left: in bed;with call bell/phone within reach;with family/visitor present Nurse Communication: Mobility status         Time: 6151-8343 PT Time Calculation (min): 23 min   Charges:   PT Evaluation $Initial PT Evaluation Tier I: 1 Procedure PT Treatments $Therapeutic Activity: 23-37 mins   PT G Codes:          Kambryn Dapolito R Ieshia Hatcher 05-08-14, 7:02 PM

## 2014-04-20 ENCOUNTER — Inpatient Hospital Stay
Admission: RE | Admit: 2014-04-20 | Discharge: 2014-06-29 | Disposition: A | Discharge status: Home / Self Care | Payer: Medicare Other | Source: Ambulatory Visit | Attending: Internal Medicine | Admitting: Internal Medicine

## 2014-04-20 DIAGNOSIS — M169 Osteoarthritis of hip, unspecified: Secondary | ICD-10-CM | POA: Diagnosis not present

## 2014-04-20 DIAGNOSIS — K819 Cholecystitis, unspecified: Secondary | ICD-10-CM

## 2014-04-20 DIAGNOSIS — D649 Anemia, unspecified: Secondary | ICD-10-CM | POA: Diagnosis not present

## 2014-04-20 DIAGNOSIS — N390 Urinary tract infection, site not specified: Secondary | ICD-10-CM | POA: Diagnosis not present

## 2014-04-20 DIAGNOSIS — K7689 Other specified diseases of liver: Secondary | ICD-10-CM | POA: Diagnosis not present

## 2014-04-20 DIAGNOSIS — F3289 Other specified depressive episodes: Secondary | ICD-10-CM | POA: Diagnosis not present

## 2014-04-20 DIAGNOSIS — R7401 Elevation of levels of liver transaminase levels: Secondary | ICD-10-CM | POA: Diagnosis not present

## 2014-04-20 DIAGNOSIS — M7989 Other specified soft tissue disorders: Secondary | ICD-10-CM | POA: Diagnosis not present

## 2014-04-20 DIAGNOSIS — F329 Major depressive disorder, single episode, unspecified: Secondary | ICD-10-CM | POA: Diagnosis not present

## 2014-04-20 DIAGNOSIS — S72009D Fracture of unspecified part of neck of unspecified femur, subsequent encounter for closed fracture with routine healing: Secondary | ICD-10-CM | POA: Diagnosis not present

## 2014-04-20 DIAGNOSIS — Z87891 Personal history of nicotine dependence: Secondary | ICD-10-CM | POA: Diagnosis not present

## 2014-04-20 DIAGNOSIS — R1011 Right upper quadrant pain: Secondary | ICD-10-CM | POA: Diagnosis not present

## 2014-04-20 DIAGNOSIS — R262 Difficulty in walking, not elsewhere classified: Secondary | ICD-10-CM | POA: Diagnosis not present

## 2014-04-20 DIAGNOSIS — R079 Chest pain, unspecified: Secondary | ICD-10-CM | POA: Diagnosis not present

## 2014-04-20 DIAGNOSIS — R279 Unspecified lack of coordination: Secondary | ICD-10-CM | POA: Diagnosis not present

## 2014-04-20 DIAGNOSIS — E119 Type 2 diabetes mellitus without complications: Secondary | ICD-10-CM | POA: Diagnosis not present

## 2014-04-20 DIAGNOSIS — I2510 Atherosclerotic heart disease of native coronary artery without angina pectoris: Secondary | ICD-10-CM | POA: Diagnosis not present

## 2014-04-20 DIAGNOSIS — R489 Unspecified symbolic dysfunctions: Secondary | ICD-10-CM | POA: Diagnosis not present

## 2014-04-20 DIAGNOSIS — IMO0001 Reserved for inherently not codable concepts without codable children: Secondary | ICD-10-CM | POA: Diagnosis not present

## 2014-04-20 DIAGNOSIS — K5900 Constipation, unspecified: Secondary | ICD-10-CM | POA: Diagnosis not present

## 2014-04-20 DIAGNOSIS — E871 Hypo-osmolality and hyponatremia: Secondary | ICD-10-CM | POA: Diagnosis not present

## 2014-04-20 DIAGNOSIS — R41841 Cognitive communication deficit: Secondary | ICD-10-CM | POA: Diagnosis not present

## 2014-04-20 DIAGNOSIS — L89609 Pressure ulcer of unspecified heel, unspecified stage: Secondary | ICD-10-CM | POA: Diagnosis not present

## 2014-04-20 DIAGNOSIS — Z96649 Presence of unspecified artificial hip joint: Secondary | ICD-10-CM | POA: Diagnosis not present

## 2014-04-20 DIAGNOSIS — R143 Flatulence: Secondary | ICD-10-CM | POA: Diagnosis not present

## 2014-04-20 DIAGNOSIS — K831 Obstruction of bile duct: Principal | ICD-10-CM

## 2014-04-20 DIAGNOSIS — R1310 Dysphagia, unspecified: Secondary | ICD-10-CM | POA: Diagnosis not present

## 2014-04-20 DIAGNOSIS — K8689 Other specified diseases of pancreas: Secondary | ICD-10-CM | POA: Diagnosis not present

## 2014-04-20 DIAGNOSIS — I7090 Unspecified atherosclerosis: Secondary | ICD-10-CM | POA: Diagnosis not present

## 2014-04-20 DIAGNOSIS — H8110 Benign paroxysmal vertigo, unspecified ear: Secondary | ICD-10-CM | POA: Diagnosis not present

## 2014-04-20 DIAGNOSIS — M6281 Muscle weakness (generalized): Secondary | ICD-10-CM | POA: Diagnosis not present

## 2014-04-20 DIAGNOSIS — I209 Angina pectoris, unspecified: Secondary | ICD-10-CM | POA: Diagnosis not present

## 2014-04-20 DIAGNOSIS — I5031 Acute diastolic (congestive) heart failure: Secondary | ICD-10-CM | POA: Diagnosis not present

## 2014-04-20 DIAGNOSIS — N289 Disorder of kidney and ureter, unspecified: Secondary | ICD-10-CM | POA: Diagnosis not present

## 2014-04-20 DIAGNOSIS — I10 Essential (primary) hypertension: Secondary | ICD-10-CM | POA: Diagnosis not present

## 2014-04-20 DIAGNOSIS — I509 Heart failure, unspecified: Secondary | ICD-10-CM | POA: Diagnosis not present

## 2014-04-20 DIAGNOSIS — I471 Supraventricular tachycardia: Secondary | ICD-10-CM | POA: Diagnosis not present

## 2014-04-20 DIAGNOSIS — E1149 Type 2 diabetes mellitus with other diabetic neurological complication: Secondary | ICD-10-CM | POA: Diagnosis not present

## 2014-04-20 DIAGNOSIS — M1610 Unilateral primary osteoarthritis, unspecified hip: Secondary | ICD-10-CM | POA: Diagnosis not present

## 2014-04-20 DIAGNOSIS — Z471 Aftercare following joint replacement surgery: Secondary | ICD-10-CM | POA: Diagnosis not present

## 2014-04-20 DIAGNOSIS — R141 Gas pain: Secondary | ICD-10-CM | POA: Diagnosis not present

## 2014-04-20 DIAGNOSIS — I5032 Chronic diastolic (congestive) heart failure: Secondary | ICD-10-CM | POA: Diagnosis not present

## 2014-04-20 DIAGNOSIS — M1990 Unspecified osteoarthritis, unspecified site: Secondary | ICD-10-CM

## 2014-04-20 DIAGNOSIS — M79609 Pain in unspecified limb: Secondary | ICD-10-CM | POA: Diagnosis not present

## 2014-04-20 DIAGNOSIS — I498 Other specified cardiac arrhythmias: Secondary | ICD-10-CM | POA: Diagnosis not present

## 2014-04-20 DIAGNOSIS — Z9181 History of falling: Secondary | ICD-10-CM | POA: Diagnosis not present

## 2014-04-20 DIAGNOSIS — R634 Abnormal weight loss: Secondary | ICD-10-CM | POA: Diagnosis not present

## 2014-04-20 LAB — CBC
HCT: 34.4 % — ABNORMAL LOW (ref 36.0–46.0)
Hemoglobin: 11.6 g/dL — ABNORMAL LOW (ref 12.0–15.0)
MCH: 33.1 pg (ref 26.0–34.0)
MCHC: 33.7 g/dL (ref 30.0–36.0)
MCV: 98.3 fL (ref 78.0–100.0)
Platelets: 379 10*3/uL (ref 150–400)
RBC: 3.5 MIL/uL — ABNORMAL LOW (ref 3.87–5.11)
RDW: 13.7 % (ref 11.5–15.5)
WBC: 4.8 10*3/uL (ref 4.0–10.5)

## 2014-04-20 LAB — GLUCOSE, CAPILLARY
Glucose-Capillary: 240 mg/dL — ABNORMAL HIGH (ref 70–99)
Glucose-Capillary: 285 mg/dL — ABNORMAL HIGH (ref 70–99)

## 2014-04-20 LAB — BASIC METABOLIC PANEL
BUN: 9 mg/dL (ref 6–23)
CO2: 20 mEq/L (ref 19–32)
Calcium: 9.3 mg/dL (ref 8.4–10.5)
Chloride: 96 mEq/L (ref 96–112)
Creatinine, Ser: 0.56 mg/dL (ref 0.50–1.10)
GFR calc Af Amer: >90 mL/min (ref >90)
GFR calc non Af Amer: 85 mL/min — ABNORMAL LOW (ref >90)
Glucose, Bld: 271 mg/dL — ABNORMAL HIGH (ref 70–99)
Potassium: 4.7 mEq/L (ref 3.7–5.3)
Sodium: 132 mEq/L — ABNORMAL LOW (ref 137–147)

## 2014-04-20 MED ORDER — CITALOPRAM HYDROBROMIDE 10 MG PO TABS: 10.0000 mg | ORAL_TABLET | Freq: Every day | ORAL | Status: DC

## 2014-04-20 MED ORDER — METOPROLOL TARTRATE 50 MG PO TABS: 50.0000 mg | ORAL_TABLET | Freq: Two times a day (BID) | ORAL | Status: DC

## 2014-04-20 NOTE — Progress Notes (Signed)
Pt discharged prior to my evaluation. Note reviewed. Agree with recommendations as noted above.

## 2014-04-20 NOTE — Progress Notes (Signed)
UR chart review completed.  

## 2014-04-20 NOTE — Discharge Summary (Signed)
Physician Discharge Summary  [name] TDV:[id number] DOB: March 05, 1931 DOA: 04/16/2014  PCP: Michele Gravel, MD  Admit date: 04/16/2014 Discharge date: 04/20/2014  Time spent: 40 minutes  Recommendations for Outpatient Follow-up:  1. Return to [facility] for continued physical therapy 2. Follow up with cardiologist, Dr. Angelena Chambers in 2 weeks 3. Follow up with orthopedics, Dr. Aline Chambers in 4 weeks  Discharge Diagnoses:  Principal Problem:   PSVT (paroxysmal supraventricular tachycardia) Active Problems:   DIABETES MELLITUS, TYPE II, UNCONTROLLED   HYPERTENSION   CORONARY ATHEROSCLEROSIS NATIVE CORONARY ARTERY   Hyponatremia   Elevated transaminase level   Chest pain   Pressure ulcer, heel, right, unstageable   Anemia  Depression  Discharge Condition: improved  Diet recommendation: low salt, low carb  Filed Weights   04/17/14 0738 04/18/14 0500 04/19/14 0500  Weight: 61.4 kg (135 lb 5.8 oz) 62.1 kg (136 lb 14.5 oz) 61.2 kg (134 lb 14.7 oz)    History of present illness:  [name] is a 80 y.o. female with a history of PSVT, nonobstructive coronary artery disease, diabetes mellitus, and chronic diastolic heart failure. She is also status post right hip surgery on 03/28/14. She was transferred to the [facility] for rehabilitation following discharge. She presents to the hospital this morning from the [name] ([place]) with a chief complaint of chest pain. She developed chest pain earlier this morning at rest. The pain is located left of the substernal area. She describes the pain as a smothering feeling. She had associated shortness of breath and palpitations. She denies associated diaphoresis, nausea, vomiting, or radiation of the chest pain. The pain subsided before she was transferred to the ED. She is currently without any complaints of chest pain. Her review of systems is negative for fever, chills, abdominal pain, diarrhea, cough, or pain with urination. Her review of systems is  positive for right hip pain, particularly when she tries to ambulate with physical therapy.  In the ED, she was tachycardic with a heart rate ranging from 90-138 beats per minute and her blood pressure was in the 06Y systolically. Her initial EKG revealed sinus rhythm, prolonged PR interval, and a heart rate of 94 beats per minute. Her followup EKG revealed tachycardia with a heart rate of 134 beats per minute. Her troponin I was negative. Her chest x-ray revealed interstitial coarsening which could be congestive or bronchitic with mild improvement in right basilar opacity. Her serum sodium is 131, alkaline phosphatase 738, AST 52, ALT 37. Pro BNP 666. She is being admitted for further evaluation and management.   Hospital Course:  This patient was admitted to hospital with chest pain and supraventricular tachycardia. She was admitted to the step down unit. She initially received intravenous Cardizem in the emergency room which transiently improved her heart rate. She was seen by cardiology and her Lopressor was increased from 50 mg daily to twice a day. Her heart rate has since improved and stabilized in the 60s to 70s range. She's not having any further symptoms. The patient did attempt to work with physical therapy her heart rate remained controlled. She will need followup with cardiology in the outpatient setting. She did complain of right hip pain and therefore x-ray was performed. This did not show any acute abnormalities. She is recommended to continue physical therapy and follow up with Dr. Aline Chambers in 4 weeks. She recently had hip surgery done. Per Dr. Ruthe Mannan note, she is continued on Plavix for DVT prophylaxis. Her family does describe  significant depression. Patient has a long history of depression and her current illness/debility has worsened her symptoms. She will be started on low-dose Celexa and this can be further titrated in the outpatient setting as felt  appropriate.  Procedures:    Consultations:  Cardiology  Discharge Exam: Filed Vitals:   04/20/14 0730  BP:   Pulse:   Temp: 98.6 F (37 C)  Resp:     General: NAd Cardiovascular: S1, S2 RRR Respiratory: CTA B  Discharge Instructions You were cared for by a hospitalist during your hospital stay. If you have any questions about your discharge medications or the care you received while you were in the hospital after you are discharged, you can call the unit and asked to speak with the hospitalist on call if the hospitalist that took care of you is not available. Once you are discharged, your primary care physician will handle any further medical issues. Please note that NO REFILLS for any discharge medications will be authorized once you are discharged, as it is imperative that you return to your primary care physician (or establish a relationship with a primary care physician if you do not have one) for your aftercare needs so that they can reassess your need for medications and monitor your lab values.  Discharge Instructions   Diet - low sodium heart healthy    Complete by:  As directed      Diet Carb Modified    Complete by:  As directed      Increase activity slowly    Complete by:  As directed             Medication List    STOP taking these medications       lisinopril 20 MG tablet  Commonly known as:  PRINIVIL,ZESTRIL     metoprolol succinate 50 MG 24 hr tablet  Commonly known as:  TOPROL-XL      TAKE these medications       ALPRAZolam 0.25 MG tablet  Commonly known as:  XANAX  Take 1 tablet (0.25 mg total) by mouth 2 (two) times daily as needed for anxiety.     atorvastatin 10 MG tablet  Commonly known as:  LIPITOR  Take 1 tablet (10 mg total) by mouth daily.     CEREFOLIN NAC PO  Take 1 tablet by mouth daily.     cholecalciferol 1000 UNITS tablet  Commonly known as:  VITAMIN D  Take 1,000 Units by mouth every morning.     citalopram 10 MG  tablet  Commonly known as:  CELEXA  Take 1 tablet (10 mg total) by mouth daily.     clopidogrel 75 MG tablet  Commonly known as:  PLAVIX  Take 1 tablet (75 mg total) by mouth daily with breakfast.     DSS 100 MG Caps  Take 100 mg by mouth 2 (two) times daily.     esomeprazole 40 MG capsule  Commonly known as:  NEXIUM  Take 40 mg by mouth at bedtime.     feeding supplement (GLUCERNA SHAKE) Liqd  Take 237 mLs by mouth 3 (three) times daily between meals.     furosemide 40 MG tablet  Commonly known as:  LASIX  Take 1 tablet (40 mg total) by mouth daily.     HYDROcodone-acetaminophen 5-325 MG per tablet  Commonly known as:  NORCO/VICODIN  Take one tablet by mouth every 4 hours as needed for pain     insulin aspart 100 UNIT/ML injection  Commonly known as:  novoLOG  Inject 3 Units into the skin 3 (three) times daily before meals. *if CBG > 150     insulin glargine 100 UNIT/ML injection  Commonly known as:  LANTUS  Inject 20 Units into the skin at bedtime.     metoprolol 50 MG tablet  Commonly known as:  LOPRESSOR  Take 1 tablet (50 mg total) by mouth 2 (two) times daily.     mirtazapine 30 MG tablet  Commonly known as:  REMERON  Take 30 mg by mouth at bedtime.     polyethylene glycol packet  Commonly known as:  MIRALAX / GLYCOLAX  Take 17 g by mouth 2 (two) times daily.     spironolactone 25 MG tablet  Commonly known as:  ALDACTONE  Take 25 mg by mouth daily.     tiotropium 18 MCG inhalation capsule  Commonly known as:  SPIRIVA  Place 18 mcg into inhaler and inhale daily as needed (shortness of breath).       Allergies  Allergen Reactions  . Aricept [Donepezil Hcl]     Hives, vomiting and headache  . Namenda [Memantine Hcl]     hives  . Codeine   . Latex   . Penicillins Rash      The results of significant diagnostics from this hospitalization (including imaging, microbiology, ancillary and laboratory) are listed below for reference.    Significant  Diagnostic Studies: Dg Hip Complete Right  04/19/2014   CLINICAL DATA:  Recent total hip replacement  EXAM: RIGHT HIP - COMPLETE 2+ VIEW  COMPARISON:  Mar 21, 2014  FINDINGS: Frontal pelvis as well as frontal and lateral left hip images were obtained. The patient is status post total hip replacement on the right with prosthetic component well-seated. No fracture or dislocation. There is moderate narrowing of the left hip joint. Bones are osteoporotic.  IMPRESSION: The prosthesis on the right well-seated. Moderate osteoarthritic change left hip joint. Bones are osteoporotic. No acute fracture or dislocation.   Electronically Signed   By: Lowella Grip M.D.   On: 04/19/2014 10:31   Dg Hip Complete Right  03/21/2014   CLINICAL DATA:  RIGHT hip pain post fall earlier today  EXAM: RIGHT HIP - COMPLETE 2+ VIEW  COMPARISON:  None  FINDINGS: Osseous demineralization.  Minimal narrowing of the joints bilaterally.  Displaced fracture of the RIGHT femoral neck.  No dislocation.  SI joints symmetric.  No other focal bony abnormalities identified.  IMPRESSION: Displaced RIGHT femoral neck fracture without dislocation.   Electronically Signed   By: Lavonia Dana M.D.   On: 03/21/2014 16:31   Dg Pelvis Portable  03/28/2014   CLINICAL DATA:  Postop right hip arthroplasty  EXAM: PORTABLE PELVIS 1-2 VIEWS  COMPARISON:  03/21/2014  FINDINGS: Right hip arthroplasty changes noted. Alignment appears anatomic in the frontal plane. No hardware or osseous abnormality. Visualized pelvis intact. Minor degenerative changes of the left hip.  IMPRESSION: Status post right hip arthroplasty.  No complicating feature.   Electronically Signed   By: Daryll Brod M.D.   On: 03/28/2014 20:05   Nm Pulmonary Perf And Vent  04/17/2014   CLINICAL DATA:  Chest pain.  EXAM: NUCLEAR MEDICINE VENTILATION - PERFUSION LUNG SCAN  TECHNIQUE: Ventilation images were obtained in multiple projections using inhaled aerosol technetium 99 M DTPA. Perfusion  images were obtained in multiple projections after intravenous injection of Tc-42mMAA.  RADIOPHARMACEUTICALS:  40 mCi Tc-944mTPA aerosol and 6 mCi Tc-9960mA  COMPARISON:  Chest x-ray 06/16/2014.  FINDINGS: Ventilation: Small patchy ventilatory defects.  Perfusion: Small patchy perfusion defects smaller than ventilatory defect, no prominent perfusion mismatch.  IMPRESSION: Low probability pulmonary embolus.   Electronically Signed   By: [place]   On: 04/17/2014 10:15   Dg Chest Port 1 View  04/16/2014   CLINICAL DATA:  Chest pain.  EXAM: PORTABLE CHEST - 1 VIEW  COMPARISON:  03/24/2014  FINDINGS: Unchanged convexity of the upper mediastinum, vascular based on chest CT 06/23/2013. Mild cardiomegaly which is chronic.  Decreased but persistent diffuse interstitial coarsening. Persistent asymmetric streaky density at the right base. No evidence of effusion or pneumothorax. Left mastectomy.  IMPRESSION: 1. Interstitial coarsening which could be congestive or bronchitic. 2. Mild improvement in a right basilar opacity, possible clearing pneumonia or asymmetric edema.   Electronically Signed   By: Jorje Guild M.D.   On: 04/16/2014 22:31   Dg Chest Port 1 View  03/24/2014   CLINICAL DATA:  Cough and congestion.  EXAM: PORTABLE CHEST - 1 VIEW  COMPARISON:  Single view of the chest 03/21/2014 and 02/16/2014.  FINDINGS: Right worse than left airspace disease persists. Aeration in the right lung base has worsened. Heart size is upper normal. No pneumothorax identified. There is likely a small right pleural effusion.  IMPRESSION: Persistent pulmonary edema. Worsened aeration in the right base could be due to atelectasis, more intense edema or pneumonia.   Electronically Signed   By: Inge Rise M.D.   On: 03/24/2014 17:09   Dg Chest Port 1 View  03/21/2014   CLINICAL DATA:  [place].  Hip fracture.  EXAM: PORTABLE CHEST - 1 VIEW  COMPARISON:  Chest x-ray 02/16/2014  FINDINGS: The heart is within normal  limits and stable. There is tortuosity and calcification of the thoracic aorta. There is significant vascular congestion and probable interstitial pulmonary edema. No definite pleural effusions or pneumothorax. The bony thorax is intact.  IMPRESSION: Vascular congestion and pulmonary edema.   Electronically Signed   By: Kalman Jewels M.D.   On: 03/21/2014 17:54   Dg Shoulder Left Port  03/25/2014   CLINICAL DATA:  Recent injury with pain  EXAM: PORTABLE LEFT SHOULDER - 2+ VIEW  COMPARISON:  None.  FINDINGS: There is no evidence of fracture or dislocation. There is no evidence of arthropathy or other focal bone abnormality. Soft tissues are unremarkable.  IMPRESSION: No acute abnormality noted.   Electronically Signed   By: Inez Catalina M.D.   On: 03/25/2014 20:27    Microbiology: Recent Results (from the past 240 hour(s))  MRSA PCR SCREENING     Status: None   Collection Time    04/17/14  9:25 AM      Result Value Ref Range Status   MRSA by PCR NEGATIVE  NEGATIVE Final   Comment:            The GeneXpert MRSA Assay (FDA     approved for NASAL specimens     only), is one component of a     comprehensive MRSA colonization     surveillance program. It is not     intended to diagnose MRSA     infection nor to guide or     monitor treatment for     MRSA infections.     Labs: Basic Metabolic Panel:  Recent Labs Lab 04/16/14 2237 04/17/14 0749 04/18/14 0738 04/19/14 0808  NA 131* 137 138 140  K 3.9 3.5* 4.9 4.5  CL 93* 99 103 103  CO2 _0 GLUCOSE 256* 128* 88 137*  BUN _1 CREATININE 0.72 0.68 0.62 0.65  CALCIUM 9.3 9.1 9.2 9.5  MG  --  1.6 1.8  --    Liver Function Tests:  Recent Labs Lab 04/16/14 2237 04/18/14 0738  AST 52* 49*  ALT 37* 33  ALKPHOS 738* 692*  BILITOT 0.5 0.5  PROT 7.3 7.0  ALBUMIN 2.5* 2.5*   No results found for this basename: LIPASE, AMYLASE,  in the last 168 hours No results found for this basename: AMMONIA,  in the last 168  hours CBC:  Recent Labs Lab 04/16/14 2237 04/18/14 0738 04/19/14 0808 04/20/14 0908  WBC 5.1 5.4 4.8 4.8  NEUTROABS 2.6  --   --   --   HGB 11.0* 10.2* 10.9* 11.6*  HCT 33.2* 30.5* 33.0* 34.4*  MCV 98.8 99.7 99.4 98.3  PLT 310 328 366 379   Cardiac Enzymes:  Recent Labs Lab 04/16/14 2249 04/17/14 0128  TROPONINI <0.30 <0.30   BNP: BNP (last 3 results)  Recent Labs  02/18/14 0724 03/21/14 1741 04/17/14 0749  PROBNP 285.7 556.1* 666.2*   CBG:  Recent Labs Lab 04/19/14 0812 04/19/14 1149 04/19/14 1633 04/19/14 2058 04/20/14 0731  GLUCAP 123* 145* 280* 218* 240*       Signed:  Jolaine Artist Calob Baskette  [facility] 04/20/2014, 9:42 AM

## 2014-04-20 NOTE — Progress Notes (Signed)
Consulting cardiologist: Kate Sable MD Primary Cardiologist: Lauree Chandler Md  Subjective:    No complaints. Denies rapid heart rhythm. Overall better, but continues generalized weakness from hip repair. Unable to walk with physical therapy yesterday.  Objective:   Temp:  [98.3 F (36.8 C)-99 F (37.2 C)] 98.3 F (36.8 C) (06/05 0434) Pulse Rate:  [26-89] 71 (06/05 0500) Resp:  [15-26] 21 (06/05 0600) BP: (81-158)/(42-107) 133/73 mmHg (06/05 0600) SpO2:  [82 %-99 %] 93 % (06/05 0500) Last BM Date: 04/17/14  Filed Weights   04/17/14 0738 04/18/14 0500 04/19/14 0500  Weight: 135 lb 5.8 oz (61.4 kg) 136 lb 14.5 oz (62.1 kg) 134 lb 14.7 oz (61.2 kg)    Intake/Output Summary (Last 24 hours) at 04/20/14 0751 Last data filed at 04/19/14 2100  Gross per 24 hour  Intake    200 ml  Output      0 ml  Net    200 ml    Telemetry: NSR with blocked PAC   Exam:  General: No acute distress.  HEENT: Conjunctiva and lids normal, oropharynx clear.  Lungs: Clear to auscultation, nonlabored.  Cardiac: No elevated JVP or bruits. RRR, no gallop or rub.   Abdomen: Normoactive bowel sounds, nontender, nondistended.  Extremities: No pitting edema, distal pulses full.  Neuropsychiatric: Alert and oriented x3, affect appropriate.   Lab Results:  Basic Metabolic Panel:  Recent Labs Lab 04/17/14 0749 04/18/14 0738 04/19/14 0808  NA 137 138 140  K 3.5* 4.9 4.5  CL 99 103 103  CO2 _0 GLUCOSE 128* 88 137*  BUN _1 CREATININE 0.68 0.62 0.65  CALCIUM 9.1 9.2 9.5  MG 1.6 1.8  --     Liver Function Tests:  Recent Labs Lab 04/16/14 2237 04/18/14 0738  AST 52* 49*  ALT 37* 33  ALKPHOS 738* 692*  BILITOT 0.5 0.5  PROT 7.3 7.0  ALBUMIN 2.5* 2.5*    CBC:  Recent Labs Lab 04/16/14 2237 04/18/14 0738 04/19/14 0808  WBC 5.1 5.4 4.8  HGB 11.0* 10.2* 10.9*  HCT 33.2* 30.5* 33.0*  MCV 98.8 99.7 99.4  PLT 310 328 366    Cardiac  Enzymes:  Recent Labs Lab 04/16/14 2249 04/17/14 0128  TROPONINI <0.30 <0.30    BNP:  Recent Labs  02/18/14 0724 03/21/14 1741 04/17/14 0749  PROBNP 285.7 556.1* 666.2*   Radiology: Dg Hip Complete Right  04/19/2014   CLINICAL DATA:  Recent total hip replacement  EXAM: RIGHT HIP - COMPLETE 2+ VIEW  COMPARISON:  Mar 21, 2014  FINDINGS: Frontal pelvis as well as frontal and lateral left hip images were obtained. The patient is status post total hip replacement on the right with prosthetic component well-seated. No fracture or dislocation. There is moderate narrowing of the left hip joint. Bones are osteoporotic.  IMPRESSION: The prosthesis on the right well-seated. Moderate osteoarthritic change left hip joint. Bones are osteoporotic. No acute fracture or dislocation.   Electronically Signed   By: Lowella Grip M.D.   On: 04/19/2014 10:31      Medications:   Scheduled Medications: . atorvastatin  10 mg Oral Daily  . cholecalciferol  1,000 Units Oral q morning - 10a  . clopidogrel  75 mg Oral Q breakfast  . docusate sodium  100 mg Oral BID  . enoxaparin (LOVENOX) injection  40 mg Subcutaneous Q24H  . feeding supplement (GLUCERNA SHAKE)  237 mL Oral TID BM  . furosemide  40  mg Oral Daily  . insulin aspart  0-5 Units Subcutaneous QHS  . insulin aspart  0-9 Units Subcutaneous TID WC  . insulin glargine  10 Units Subcutaneous QHS  . metoprolol tartrate  50 mg Oral BID  . mirtazapine  30 mg Oral QHS  . pantoprazole  80 mg Oral Q1200  . polyethylene glycol  17 g Oral Daily  . spironolactone  25 mg Oral Daily       PRN Medications: acetaminophen, acetaminophen, albuterol, ALPRAZolam, alum & mag hydroxide-simeth, HYDROcodone-acetaminophen, ondansetron (ZOFRAN) IV, ondansetron, tiotropium   Assessment and Plan:   1. SVT: Review of telemetry over the last 24 hours demonstrates that she did have one short episode of SVT early yesterday morning, no further episodes. Review of  recent few hours demonstrated she had normal sinus rhythm with a blocked PAC. Heart rate is running in the 60s and 70s. Heart rate is extremely elevated with activity. Continue current dose of metoprolol 50 mg twice a day. Consider moving to the telemetry floor.  2. Hypertension: Blood pressure is controlled. Review of transfer demonstrates blood pressure between 856 and 3:14 systolically. One episode at 4 AM of 81/68. She remains on beta blocker, also Lasix and spironolactone. Review of most recent labs yesterday morning potassium 4.5 with a creatinine of 0.65, CO2 23.  3.CAD: Nonobstructive disease per cardiac catheterization in 2011. Continue medical management.  4. Chronic Diastolic CHF: Appears well compensated. Lungs are clear. No edema. No shortness of breath. Continue Lasix and spironolactone. Labs are reviewed.  5. Right hip fx with repair: Continued stability, working with physical therapy, complaining that she is unable to bear weight. Progression of her physical therapy recommendations.  6. Carotid artery disease: Medical management. Continues on clopidogrel 75 mg daily.    [name]. Tyjah Hai NP  04/20/2014, 7:51 AM

## 2014-04-20 NOTE — Clinical Social Work Note (Signed)
Patient ready for discharge today, will return to [facility].  [facility] aware and agreeable, CSW spoke w daughter [name] in room, family agreeable.  Discharge summary faxed to facility via [facility], no FL2 needed per [facility], discharge packet prepared and delivered to [facility] Marianna Fuss).  CSW signing off as no further SW needs identified.  Edwyna Shell, LCSW Clinical Social Worker [phone])

## 2014-04-20 NOTE — Progress Notes (Signed)
Patient transferred back to [facility], room 124. Report given to Holley Bouche, nurse. Vital signs stable at transfer. Daughter with patient at transfer.

## 2014-04-21 LAB — GLUCOSE, CAPILLARY
Glucose-Capillary: 276 mg/dL — ABNORMAL HIGH (ref 70–99)
Glucose-Capillary: 316 mg/dL — ABNORMAL HIGH (ref 70–99)
Glucose-Capillary: 302 mg/dL — ABNORMAL HIGH (ref 70–99)
Glucose-Capillary: 284 mg/dL — ABNORMAL HIGH (ref 70–99)

## 2014-04-22 LAB — GLUCOSE, CAPILLARY
Glucose-Capillary: 246 mg/dL — ABNORMAL HIGH (ref 70–99)
Glucose-Capillary: 291 mg/dL — ABNORMAL HIGH (ref 70–99)
Glucose-Capillary: 305 mg/dL — ABNORMAL HIGH (ref 70–99)
Glucose-Capillary: 194 mg/dL — ABNORMAL HIGH (ref 70–99)

## 2014-04-23 ENCOUNTER — Other Ambulatory Visit: Payer: Self-pay | Admitting: *Deleted

## 2014-04-23 ENCOUNTER — Non-Acute Institutional Stay (SKILLED_NURSING_FACILITY): Payer: Medicare Other | Admitting: Internal Medicine

## 2014-04-23 DIAGNOSIS — E1149 Type 2 diabetes mellitus with other diabetic neurological complication: Secondary | ICD-10-CM

## 2014-04-23 DIAGNOSIS — H8110 Benign paroxysmal vertigo, unspecified ear: Secondary | ICD-10-CM

## 2014-04-23 DIAGNOSIS — I509 Heart failure, unspecified: Secondary | ICD-10-CM

## 2014-04-23 DIAGNOSIS — I5031 Acute diastolic (congestive) heart failure: Secondary | ICD-10-CM | POA: Diagnosis not present

## 2014-04-23 LAB — GLUCOSE, CAPILLARY
Glucose-Capillary: 290 mg/dL — ABNORMAL HIGH (ref 70–99)
Glucose-Capillary: 251 mg/dL — ABNORMAL HIGH (ref 70–99)
Glucose-Capillary: 255 mg/dL — ABNORMAL HIGH (ref 70–99)
Glucose-Capillary: 264 mg/dL — ABNORMAL HIGH (ref 70–99)
Glucose-Capillary: 238 mg/dL — ABNORMAL HIGH (ref 70–99)

## 2014-04-23 MED ORDER — ALPRAZOLAM 0.25 MG PO TABS: ORAL_TABLET | ORAL | Status: DC

## 2014-04-23 NOTE — Telephone Encounter (Signed)
[facility]

## 2014-04-24 LAB — GLUCOSE, CAPILLARY
Glucose-Capillary: 169 mg/dL — ABNORMAL HIGH (ref 70–99)
Glucose-Capillary: 289 mg/dL — ABNORMAL HIGH (ref 70–99)
Glucose-Capillary: 300 mg/dL — ABNORMAL HIGH (ref 70–99)
Glucose-Capillary: 83 mg/dL (ref 70–99)

## 2014-04-26 LAB — GLUCOSE, CAPILLARY
Glucose-Capillary: 124 mg/dL — ABNORMAL HIGH (ref 70–99)
Glucose-Capillary: 171 mg/dL — ABNORMAL HIGH (ref 70–99)
Glucose-Capillary: 259 mg/dL — ABNORMAL HIGH (ref 70–99)
Glucose-Capillary: 233 mg/dL — ABNORMAL HIGH (ref 70–99)
Glucose-Capillary: 150 mg/dL — ABNORMAL HIGH (ref 70–99)
Glucose-Capillary: 345 mg/dL — ABNORMAL HIGH (ref 70–99)
Glucose-Capillary: 397 mg/dL — ABNORMAL HIGH (ref 70–99)
Glucose-Capillary: 318 mg/dL — ABNORMAL HIGH (ref 70–99)
Glucose-Capillary: 212 mg/dL — ABNORMAL HIGH (ref 70–99)

## 2014-04-26 NOTE — Progress Notes (Addendum)
Patient ID: [name], female   DOB: Mar 20, 1931, 80 y.o.   MRN: [id number]                  HISTORY & PHYSICAL  DATE:  04/23/2014     FACILITY: [place]    LEVEL OF CARE:   SNF   HISTORY OF PRESENT ILLNESS:  [name] is a patient whom I recently admitted to the facility after suffering a fall and a right hip fracture.  She underwent a right hip arthroplasty.    Postoperatively, she developed acute diastolic heart failure, necessitating IV Lasix.    She also had a right lower lobe atelectasis or pneumonia.    Finally, she had postoperative delirium.    On this occasion, she was found to be tachycardic.  She was diagnosed with PSVT.  She received IV Cardizem and then was seen by Cardiology and her Lopressor was increased to 50 mg twice a day.   She did not have any further symptoms.    X-ray of her previously operated-on right hip did not show any abnormalities.    She was felt to be depressed and started on low dose Celexa.     Her previous echocardiogram on April 4th showed an ejection fraction of 60-65%.  She had grade 1 diastolic dysfunction.  She had moderate tricuspid regurgitation.     CURRENT MEDICATIONS:  Discharge medications include:      Atorvastatin 10 q.d.    Vitamin D 1,000 q.d.    Celexa 10 q.d.     Plavix 75 q.d.    Nexium 40 q.d.    Lasix 40 q.d.    NovoLog 3 U a.c. meals for blood sugars greater than 150.    Lantus 20 U into the skin at bedtime.    Lopressor 50 b.i.d.    Remeron 30 mg at bedtime.    MiraLAX 17 g b.i.d.    Spironolactone 25 mg daily.               Spiriva 18 mcg as needed for shortness of breath.     REVIEW OF SYSTEMS:   CHEST/RESPIRATORY:  The patient is not complaining of shortness of breath.   CARDIAC:   No clear chest pain or palpitations.   GI:  No nausea or vomiting.   GU:  No dysuria.    PHYSICAL EXAMINATION:   VITAL SIGNS:   PULSE:  74.   O2 SATURATIONS:  96% on room air.   RESPIRATIONS:   18 and unlabored.   GENERAL APPEARANCE:  The patient does indeed look perplexed and somewhat depressed.   CHEST/RESPIRATORY:  Decreased air entry bilaterally.  Her chest x-ray from 04/16/2014 suggested mild interstitial edema.  Right basilar opacity had cleared.   CARDIOVASCULAR:  CARDIAC:   Heart sounds are normal.  There are no murmurs.  No increase in jugular venous pressure.   GASTROINTESTINAL:  LIVER/SPLEEN/KIDNEYS:  No liver, no spleen.  No tenderness.   GENITOURINARY:  BLADDER:   Not enlarged.   SKIN:  INSPECTION:   Extremities:  Her incision is well healed.  CIRCULATION:   EDEMA/VARICOSITIES:  There is no evidence of a DVT.   NEUROLOGICAL:    BALANCE/GAIT:  I did not attempt to ambulate her.   PSYCHIATRIC:   MENTAL STATUS:   Still depressed-looking.  There are definite cognitive issues here.    ASSESSMENT/PLAN:  Paroxysmal supraventricular tachycardia.  This appears to be controlled.    Previous right hip fracture.  This appears to be well healed.  She is on Plavix, listed as for DVT prophylaxis.    Diastolically-mediated congestive heart failure.  She is on Lasix 40.  Stable  Type 2 diabetes.  On insulin.  Hemoglobin A1c was 8.4 on 04/17/2014.    Coronary artery disease.   Elevated liver function tests.  She has an alk phos of 804.  This was 692 in the hospital.  AST was 51, ALT 41.  The etiology here is not really clear.    Depression with coexistent dementia.   She is now on Remeron 30 which she was on previously, and Celexa 10.  I am not really sure about this combination.    As I remember, this patient was essentially nonambulatory.  The efficacy of rehab here is questionable.  We will have to see how she does.    She was on Remeron for her depression.  This has been changed to Celexa, or at least Celexa has been added in whether she has been improving on the Remeron or not.      CPT CODE: 94496       ADDENDUM:  I have discussed the Remeron with her son.    Apparently, this has helped her sleep but has not been an effective antidepressant.  I may actually reduce the Remeron and increase the Celexa since it has been started.    Also, she has an unstageable wound on her right heel.  This is blistered.  We will have to see how this goes.

## 2014-04-27 LAB — GLUCOSE, CAPILLARY
Glucose-Capillary: 239 mg/dL — ABNORMAL HIGH (ref 70–99)
Glucose-Capillary: 158 mg/dL — ABNORMAL HIGH (ref 70–99)
Glucose-Capillary: 301 mg/dL — ABNORMAL HIGH (ref 70–99)
Glucose-Capillary: 241 mg/dL — ABNORMAL HIGH (ref 70–99)

## 2014-04-28 LAB — GLUCOSE, CAPILLARY
Glucose-Capillary: 150 mg/dL — ABNORMAL HIGH (ref 70–99)
Glucose-Capillary: 225 mg/dL — ABNORMAL HIGH (ref 70–99)
Glucose-Capillary: 251 mg/dL — ABNORMAL HIGH (ref 70–99)
Glucose-Capillary: 269 mg/dL — ABNORMAL HIGH (ref 70–99)

## 2014-04-29 LAB — GLUCOSE, CAPILLARY
Glucose-Capillary: 255 mg/dL — ABNORMAL HIGH (ref 70–99)
Glucose-Capillary: 155 mg/dL — ABNORMAL HIGH (ref 70–99)

## 2014-04-30 LAB — GLUCOSE, CAPILLARY
Glucose-Capillary: 166 mg/dL — ABNORMAL HIGH (ref 70–99)
Glucose-Capillary: 172 mg/dL — ABNORMAL HIGH (ref 70–99)
Glucose-Capillary: 174 mg/dL — ABNORMAL HIGH (ref 70–99)
Glucose-Capillary: 219 mg/dL — ABNORMAL HIGH (ref 70–99)
Glucose-Capillary: 328 mg/dL — ABNORMAL HIGH (ref 70–99)
Glucose-Capillary: 277 mg/dL — ABNORMAL HIGH (ref 70–99)

## 2014-05-01 LAB — GLUCOSE, CAPILLARY
Glucose-Capillary: 161 mg/dL — ABNORMAL HIGH (ref 70–99)
Glucose-Capillary: 241 mg/dL — ABNORMAL HIGH (ref 70–99)
Glucose-Capillary: 227 mg/dL — ABNORMAL HIGH (ref 70–99)
Glucose-Capillary: 141 mg/dL — ABNORMAL HIGH (ref 70–99)

## 2014-05-02 ENCOUNTER — Non-Acute Institutional Stay (SKILLED_NURSING_FACILITY): Payer: Medicare Other | Admitting: Internal Medicine

## 2014-05-02 DIAGNOSIS — K7689 Other specified diseases of liver: Secondary | ICD-10-CM | POA: Diagnosis not present

## 2014-05-02 DIAGNOSIS — K831 Obstruction of bile duct: Secondary | ICD-10-CM

## 2014-05-02 LAB — GLUCOSE, CAPILLARY: Glucose-Capillary: 247 mg/dL — ABNORMAL HIGH (ref 70–99)

## 2014-05-03 LAB — GLUCOSE, CAPILLARY
Glucose-Capillary: 106 mg/dL — ABNORMAL HIGH (ref 70–99)
Glucose-Capillary: 143 mg/dL — ABNORMAL HIGH (ref 70–99)
Glucose-Capillary: 188 mg/dL — ABNORMAL HIGH (ref 70–99)
Glucose-Capillary: 79 mg/dL (ref 70–99)
Glucose-Capillary: 305 mg/dL — ABNORMAL HIGH (ref 70–99)
Glucose-Capillary: 221 mg/dL — ABNORMAL HIGH (ref 70–99)

## 2014-05-04 ENCOUNTER — Ambulatory Visit (HOSPITAL_COMMUNITY)
Admit: 2014-05-04 | Discharge: 2014-05-04 | Disposition: A | Discharge status: Home / Self Care | Payer: Medicare Other | Attending: Internal Medicine | Admitting: Internal Medicine

## 2014-05-04 DIAGNOSIS — K7689 Other specified diseases of liver: Secondary | ICD-10-CM | POA: Diagnosis not present

## 2014-05-04 LAB — GLUCOSE, CAPILLARY
Glucose-Capillary: 98 mg/dL (ref 70–99)
Glucose-Capillary: 164 mg/dL — ABNORMAL HIGH (ref 70–99)
Glucose-Capillary: 169 mg/dL — ABNORMAL HIGH (ref 70–99)
Glucose-Capillary: 265 mg/dL — ABNORMAL HIGH (ref 70–99)
Glucose-Capillary: 197 mg/dL — ABNORMAL HIGH (ref 70–99)

## 2014-05-05 LAB — GLUCOSE, CAPILLARY
Glucose-Capillary: 140 mg/dL — ABNORMAL HIGH (ref 70–99)
Glucose-Capillary: 227 mg/dL — ABNORMAL HIGH (ref 70–99)
Glucose-Capillary: 280 mg/dL — ABNORMAL HIGH (ref 70–99)
Glucose-Capillary: 302 mg/dL — ABNORMAL HIGH (ref 70–99)

## 2014-05-06 LAB — GLUCOSE, CAPILLARY
Glucose-Capillary: 161 mg/dL — ABNORMAL HIGH (ref 70–99)
Glucose-Capillary: 134 mg/dL — ABNORMAL HIGH (ref 70–99)
Glucose-Capillary: 177 mg/dL — ABNORMAL HIGH (ref 70–99)
Glucose-Capillary: 433 mg/dL — ABNORMAL HIGH (ref 70–99)
Glucose-Capillary: 295 mg/dL — ABNORMAL HIGH (ref 70–99)
Glucose-Capillary: 206 mg/dL — ABNORMAL HIGH (ref 70–99)

## 2014-05-08 LAB — GLUCOSE, CAPILLARY
Glucose-Capillary: 135 mg/dL — ABNORMAL HIGH (ref 70–99)
Glucose-Capillary: 270 mg/dL — ABNORMAL HIGH (ref 70–99)
Glucose-Capillary: 239 mg/dL — ABNORMAL HIGH (ref 70–99)
Glucose-Capillary: 260 mg/dL — ABNORMAL HIGH (ref 70–99)

## 2014-05-09 LAB — GLUCOSE, CAPILLARY
Glucose-Capillary: 150 mg/dL — ABNORMAL HIGH (ref 70–99)
Glucose-Capillary: 165 mg/dL — ABNORMAL HIGH (ref 70–99)
Glucose-Capillary: 118 mg/dL — ABNORMAL HIGH (ref 70–99)
Glucose-Capillary: 268 mg/dL — ABNORMAL HIGH (ref 70–99)
Glucose-Capillary: 165 mg/dL — ABNORMAL HIGH (ref 70–99)
Glucose-Capillary: 168 mg/dL — ABNORMAL HIGH (ref 70–99)

## 2014-05-10 ENCOUNTER — Non-Acute Institutional Stay (SKILLED_NURSING_FACILITY): Payer: Medicare Other | Admitting: Internal Medicine

## 2014-05-10 DIAGNOSIS — R7401 Elevation of levels of liver transaminase levels: Secondary | ICD-10-CM

## 2014-05-10 DIAGNOSIS — I509 Heart failure, unspecified: Secondary | ICD-10-CM | POA: Diagnosis not present

## 2014-05-10 DIAGNOSIS — I5031 Acute diastolic (congestive) heart failure: Secondary | ICD-10-CM | POA: Diagnosis not present

## 2014-05-10 DIAGNOSIS — R740 Nonspecific elevation of levels of transaminase and lactic acid dehydrogenase [LDH]: Principal | ICD-10-CM

## 2014-05-10 DIAGNOSIS — R7402 Elevation of levels of lactic acid dehydrogenase (LDH): Secondary | ICD-10-CM | POA: Diagnosis not present

## 2014-05-10 LAB — GLUCOSE, CAPILLARY
Glucose-Capillary: 184 mg/dL — ABNORMAL HIGH (ref 70–99)
Glucose-Capillary: 156 mg/dL — ABNORMAL HIGH (ref 70–99)
Glucose-Capillary: 133 mg/dL — ABNORMAL HIGH (ref 70–99)
Glucose-Capillary: 199 mg/dL — ABNORMAL HIGH (ref 70–99)
Glucose-Capillary: 248 mg/dL — ABNORMAL HIGH (ref 70–99)
Glucose-Capillary: 241 mg/dL — ABNORMAL HIGH (ref 70–99)

## 2014-05-10 NOTE — Progress Notes (Addendum)
Patient ID: JANAYSHA DEPAULO, female   DOB: Feb 09, 1931, 79 y.o.   MRN: [id number]                 PROGRESS NOTE  DATE:  05/02/2014     FACILITY: [facility]    LEVEL OF CARE:   SNF   Acute Visit   CHIEF COMPLAINT:  Cholestatic liver function test abnormalities.    HISTORY OF PRESENT ILLNESS:  Mrs. Vossler is a patient whom I initially admitted to the facility after a fall and a right hip fracture.  She underwent a right hip hemiarthroplasty.    Her postoperative course was dominated by acute diastolic heart failure.  She also had right lower lobe atelectasis or pneumonia and delirium.  She was readmitted to the hospital and found to be in PSVT.  This was treated with Cardizem and Lopressor.  She did not have any further symptoms.    While she was in hospital on 04/18/2014, it was found that her alk phos was 692, AST 49, and her ALT was 33.   Albumin was 2.5.  On 04/16/2014, the pattern was the same:  738, 52, and 37.  Going back in [facility]:  On 03/21/2014, her alk phos was 152, her AST was 28, ALT 18.  Her lipase was 23.  On 02/16/2014, all of these values were normal.  She had an ultrasound of the abdomen on 06/15/2013 that was essentially normal post cholecystectomy.    CURRENT MEDICATIONS:  Reviewing her medications, noting that she is a diabetic on insulin, shows her to be on:    Atorvastatin 10 q.d.    Celexa 10 q.d.    Plavix 75 q.d.    Nexium 40 q.d.    Lasix 40 q.d.    NovoLog and Lantus insulin.    Lopressor 50 b.i.d.    Remeron 30 mg at bedtime.    Spironolactone 25 mg daily.    Spiriva 18 mcg p.r.n.    REVIEW OF SYSTEMS:   GI:   She is not complaining of nausea and vomiting.   CARDIAC:   No clear chest pain.   CHEST/RESPIRATORY:  No shortness of breath.    PHYSICAL EXAMINATION:   GENERAL APPEARANCE:  The patient does not look to be in any distress.   CHEST/RESPIRATORY:  Clear air entry bilaterally.   CARDIOVASCULAR:  CARDIAC:   Heart  sounds are normal.  She appears to be euvolemic.   GASTROINTESTINAL:  LIVER/SPLEEN/KIDNEYS:  No liver, no spleen.  No tenderness.   ABDOMEN:  No masses are noted.    ASSESSMENT/PLAN:  Cholestatic liver function tests dominated by an alk phos of over 800 with trivial elevations of her transaminases.  No evidence of her bilirubin being abnormal.  I am not aware that any of her medications can do this.  Spironolactone is listed as having liver function test abnormalities, although I am not certain about what pattern.    I am going to order a liver ultrasound.  A CT scan is not out of the question.  As I have some question about the Spironolactone, I am going to put this on hold while I repeat her liver function tests early next week.

## 2014-05-11 LAB — GLUCOSE, CAPILLARY
Glucose-Capillary: 135 mg/dL — ABNORMAL HIGH (ref 70–99)
Glucose-Capillary: 194 mg/dL — ABNORMAL HIGH (ref 70–99)
Glucose-Capillary: 228 mg/dL — ABNORMAL HIGH (ref 70–99)
Glucose-Capillary: 323 mg/dL — ABNORMAL HIGH (ref 70–99)

## 2014-05-12 ENCOUNTER — Encounter: Payer: Self-pay | Admitting: Internal Medicine

## 2014-05-12 LAB — GLUCOSE, CAPILLARY
Glucose-Capillary: 86 mg/dL (ref 70–99)
Glucose-Capillary: 167 mg/dL — ABNORMAL HIGH (ref 70–99)

## 2014-05-12 NOTE — Progress Notes (Signed)
Patient ID: [name], female   DOB: 1931-08-23, 80 y.o.   MRN: [id number]   This is an acute visit.  Level of care skilled.  Facility [facility].  CHIEF COMPLAINT:  Follow up Cholestatic liver function test abnormalities .  HISTORY OF PRESENT ILLNESS: [name] is a patient initially admitted to the facility after a fall and a right hip fracture. She underwent a right hip hemiarthroplasty.  Her postoperative course was dominated by acute diastolic heart failure. She also had right lower lobe atelectasis or pneumonia and delirium. She was readmitted to the hospital and found to be in PSVT. This was treated with Cardizem and Lopressor. She did not have any further symptoms.  While she was in hospital on 04/18/2014, it was found that her alk phos was 692, AST 49, and her ALT was 33. Albumin was 2.5. On 04/16/2014, the pattern was the same: 738, 52, and 37. Going back in [facility]: On 03/21/2014, her alk phos was 152, her AST was 28, ALT 18. Her lipase was 23. On 02/16/2014, all of these values were normal. She had an ultrasound of the abdomen on 06/15/2013 that was essentially normal post cholecystectomy.--Dr. Dellia Nims did order repeat ultrasound which did not show any changes  Her spironolactone has been held secondary to concerns this possibly may be contributing to the abnormalities-updated lab also was ordered on June 22 which shows relatively baseline values with the previous results with an alkaline phosphatase of 864-AST 67-ALT 72-albumin of 2.5-and bilirubin still within normal range at 0.4  The patient apparently is doing better she is more alert and coherent per nursing staff she has no complaints of nausea vomiting or abdominal discomfort apparently her appetite is good.  In regards to the diastolic CHF her weight has been relatively stable despite off the Aldactone-most recent weight 150.2 this appears to be a gain of about 3 pounds from her admission weight she has gained about 1 pound  in the past 3 days.  She does not complaining of any shortness of breath or chest pain-edema appears to be at baseline   CURRENT MEDICATIONS: Reviewing her medications, noting that she is a diabetic on insulin, shows her to be on:  Atorvastatin 10 q.d.  Celexa 10 q.d.  Plavix 75 q.d.  Nexium 40 q.d.  Lasix 40 q.d.  NovoLog and Lantus insulin.  Lopressor 50 b.i.d.  Remeron 30 mg at bedtime.  Spiriva 18 mcg p.r.n.  REVIEW OF SYSTEMS Gen. no complaints of fever chills Skin-no complaints rashes or itching:  GI: She is not complaining of nausea and vomiting. --Apparently appetite is stable CARDIAC: No clear chest pain.  CHEST/RESPIRATORY: No shortness of breath. Muscle skeletal-status post hip fracture with repair she's not really complaining of significant pain   PHYSICAL EXAMINATION: Temperature 98.4 pulse 72 respirations 20 blood pressure 145/72-112/68-in this range  GENERAL APPEARANCE: The patient does not look to be in any distress. Skin is warm and dry I  CHEST/RESPIRATORY: Clear air entry bilaterally.  CARDIOVASCULAR:  CARDIAC: Heart sounds are normal. She appears to be euvolemic. --1+ edema appears to be baseline GASTROINTESTINAL:  LIVER/SPLEEN/KIDNEYS: No liver, no spleen. No tenderness bowel sounds are positive.  ABDOMEN: No masses are noted.  Neurologic-is grossly intact her speech is clear.  Psych she appears grossly alert and oriented apparently this is significantly better the last few days  Labs.  05/07/2014.  Sodium 141 potassium 3.6 BUN 11 creatinine 0.5.  Alkaline phosphatase 864-AST 67--ALT 72--albumin 2.5--bilirubin 0.4  04/30/2014.  WBC 8.0 hemoglobin 12.7 platelets 446.    ASSESSMENT/PLAN:  Cholestatic liver function tests dominated by an alk phos of over 800 with trivial elevations of her transaminases. No evidence of her bilirubin being abnormal--ultrasound does not show any acute etiology here-liver function tests appear to be relatively baseline  with the previous ones which showed elevation-clinically she appears to be doing well-there is some suggestion of obtaining a CT scan per Dr. Dellia Nims we'll await his input- clinically she appears to be stable.  #2 history of diastolic CHF again her Aldactone is being held she is on Lasix as this appears to be stable but will have to be monitored closely-her weights are relatively stable she is not complaining of any chest pain or shortness of breath-per nursing staff she generally appears to be improving.  Will obtain a CBC and CMP tomorrow to keep and an eye on her electrolytes renal function  [id number]-of note greater than 25 minutes spent assessing patient and  -Discussing her status with nursing staff-reviewing her medical records-and coordinating plan of care a-of note greater than 50% of time spent coordinating plan of care .

## 2014-05-13 LAB — GLUCOSE, CAPILLARY
Glucose-Capillary: 253 mg/dL — ABNORMAL HIGH (ref 70–99)
Glucose-Capillary: 216 mg/dL — ABNORMAL HIGH (ref 70–99)
Glucose-Capillary: 101 mg/dL — ABNORMAL HIGH (ref 70–99)
Glucose-Capillary: 147 mg/dL — ABNORMAL HIGH (ref 70–99)
Glucose-Capillary: 328 mg/dL — ABNORMAL HIGH (ref 70–99)
Glucose-Capillary: 293 mg/dL — ABNORMAL HIGH (ref 70–99)
Glucose-Capillary: 232 mg/dL — ABNORMAL HIGH (ref 70–99)

## 2014-05-14 ENCOUNTER — Non-Acute Institutional Stay (SKILLED_NURSING_FACILITY): Payer: Medicare Other | Admitting: Internal Medicine

## 2014-05-14 DIAGNOSIS — K7689 Other specified diseases of liver: Secondary | ICD-10-CM

## 2014-05-14 DIAGNOSIS — K831 Obstruction of bile duct: Secondary | ICD-10-CM

## 2014-05-14 LAB — GLUCOSE, CAPILLARY
Glucose-Capillary: 114 mg/dL — ABNORMAL HIGH (ref 70–99)
Glucose-Capillary: 287 mg/dL — ABNORMAL HIGH (ref 70–99)
Glucose-Capillary: 285 mg/dL — ABNORMAL HIGH (ref 70–99)
Glucose-Capillary: 211 mg/dL — ABNORMAL HIGH (ref 70–99)

## 2014-05-15 ENCOUNTER — Encounter: Payer: Self-pay | Admitting: Cardiovascular Disease

## 2014-05-15 ENCOUNTER — Ambulatory Visit (INDEPENDENT_AMBULATORY_CARE_PROVIDER_SITE_OTHER): Payer: Medicare Other | Admitting: Cardiovascular Disease

## 2014-05-15 VITALS — BP 120/60 | HR 70 | Wt 153.0 lb

## 2014-05-15 DIAGNOSIS — I10 Essential (primary) hypertension: Secondary | ICD-10-CM

## 2014-05-15 DIAGNOSIS — Z87891 Personal history of nicotine dependence: Secondary | ICD-10-CM | POA: Diagnosis not present

## 2014-05-15 DIAGNOSIS — I498 Other specified cardiac arrhythmias: Secondary | ICD-10-CM

## 2014-05-15 DIAGNOSIS — F17201 Nicotine dependence, unspecified, in remission: Secondary | ICD-10-CM

## 2014-05-15 DIAGNOSIS — I2510 Atherosclerotic heart disease of native coronary artery without angina pectoris: Secondary | ICD-10-CM

## 2014-05-15 DIAGNOSIS — I471 Supraventricular tachycardia: Secondary | ICD-10-CM

## 2014-05-15 LAB — GLUCOSE, CAPILLARY
Glucose-Capillary: 103 mg/dL — ABNORMAL HIGH (ref 70–99)
Glucose-Capillary: 93 mg/dL (ref 70–99)
Glucose-Capillary: 299 mg/dL — ABNORMAL HIGH (ref 70–99)

## 2014-05-15 NOTE — Progress Notes (Signed)
History of Present Illness: 80 yo AAF with history of mild CAD, DM, HTN, hyperlipidemia, PAD, tobacco abuse admitted to [facility] May 13,2011 with a syncopal episode. Her troponin was elevated at 0.96 and her D-dimer was elevated at 0.93. CT angiogram chest without evidence of PE. EKG with T wave inversions anterior and inferior. Cardiac cath on 03/31/10 with mild disease in the LAD but no obstructive lesions. Echo with normal LV size and function with mild AI. Carotid dopplers normal. Her CT chest did show centrilobular emphysema. Echo 02/17/14 with normal LV function, severe LVH, moderate TR. She had right hip surgery on 03/28/14 who was transferred to the [facility] center for rehabilitation following discharge. She was readmitted 04/18/14 with recurrent chest pain described as a smothering sensation and palpitations and was found to be in SVT. She converted with IV Cardizem. She also has severe bilateral carotid artery disease with total occlusions managed with statin therapy and antiplatelet management.  She is here today for cardiac follow up. She has been doing well. She does her own cooking and cleaning. No chest pain. No recurrent syncope. She denies palpitations, lower ext edema. No claudication. She continues to smoke 6 cigarettes per day. She does have dizziness when she is scrubbing floors. Breathing is stable.   Primary Care Physician: Dr. Legrand Rams in [place]  Last Lipid Profile: Followed in primary care.   Past Medical History  Diagnosis Date  . History of GI bleed   . Parotid tumor   . History of breast cancer   . Type 2 diabetes mellitus   . Osteopenia   . Essential hypertension, benign   . Hyperlipidemia   . Depression   . Anxiety   . Chronic headaches   . Carotid artery disease     Bilateral ICA occlusion  . Orthostatic hypotension     Diabetic polyneuropathy/diabetic dysautonomia   . Coronary atherosclerosis of native coronary artery     Mild nonobstructive disease at  cardiac catheterization May 2011  . PSVT (paroxysmal supraventricular tachycardia)   . Acute diastolic congestive heart failure 03/21/2014  . Fracture of femoral neck, right, closed 03/28/2014  . Acute delirium 06/23/2013  . Syncope and collapse 04/15/2010    Qualifier: Diagnosis of  By: Angelena Form, MD, Harrell Gave    . Pressure ulcer, heel, right, unstageable 04/17/2014    Past Surgical History  Procedure Laterality Date  . Mastectomy  1994    Left breast -related to breast cancer  . Vesicovaginal fistula closure w/ tah  1979  . Cholecystectomy  2008  . Hemorrhoid surgery  1960s  . Laparoscopic appendectomy N/A 04/13/2013    Procedure: APPENDECTOMY LAPAROSCOPIC;  Surgeon: Donato Heinz, MD;  Location: [facility];  Service: General;  Laterality: N/A;  . Colonoscopy  2010    Dr. Oneida Alar: single tubular adenoma, one hyperplastic polyp. Next TCS 2020 health permitting.  . Hip arthroplasty Right 03/28/2014    Procedure: ARTHROPLASTY BIPOLAR HIP;  Surgeon: Carole Civil, MD;  Location: [facility];  Service: Orthopedics;  Laterality: Right;    Current Outpatient Prescriptions  Medication Sig Dispense Refill  . ALPRAZolam (XANAX) 0.25 MG tablet Take one tablet by mouth twice daily as needed for anxiety  60 tablet  5   No current facility-administered medications for this visit.   Outpatient Encounter Prescriptions as of 05/15/2014  Medication Sig  . ALPRAZolam (XANAX) 0.25 MG tablet Take one tablet by mouth twice daily as needed for anxiety  . atorvastatin (LIPITOR) 10 MG  tablet Take 1 tablet (10 mg total) by mouth daily.  . cholecalciferol (VITAMIN D) 1000 UNITS tablet Take 1,000 Units by mouth every morning.   . citalopram (CELEXA) 10 MG tablet Take 1 tablet (10 mg total) by mouth daily.  . clopidogrel (PLAVIX) 75 MG tablet Take 1 tablet (75 mg total) by mouth daily with breakfast.  . docusate sodium 100 MG CAPS Take 100 mg by mouth 2 (two) times daily.  Marland Kitchen esomeprazole (NEXIUM) 40 MG capsule Take  40 mg by mouth at bedtime.   . feeding supplement, GLUCERNA SHAKE, (GLUCERNA SHAKE) LIQD Take 237 mLs by mouth 3 (three) times daily between meals.  . furosemide (LASIX) 40 MG tablet Take 1 tablet (40 mg total) by mouth daily.  Marland Kitchen HYDROcodone-acetaminophen (NORCO/VICODIN) 5-325 MG per tablet Take one tablet by mouth every 4 hours as needed for pain  . insulin glargine (LANTUS) 100 UNIT/ML injection Inject 20 Units into the skin at bedtime.  . Methylfol-Methylcob-Acetylcyst (CEREFOLIN NAC PO) Take 1 tablet by mouth daily.  . Methylfol-Methylcob-Acetylcyst (CEREFOLIN NAC) 6-2-600 MG TABS Take by mouth.  . metoprolol (LOPRESSOR) 50 MG tablet Take 1 tablet (50 mg total) by mouth 2 (two) times daily.  . mirtazapine (REMERON) 30 MG tablet Take 30 mg by mouth at bedtime.  . polyethylene glycol (MIRALAX / GLYCOLAX) packet Take 17 g by mouth 2 (two) times daily.  Marland Kitchen tiotropium (SPIRIVA) 18 MCG inhalation capsule Place 18 mcg into inhaler and inhale daily as needed (shortness of breath).  . [DISCONTINUED] insulin aspart (NOVOLOG) 100 UNIT/ML injection Inject 3 Units into the skin 3 (three) times daily before meals. *if CBG > 150  . [DISCONTINUED] spironolactone (ALDACTONE) 25 MG tablet Take 25 mg by mouth daily.    Allergies  Allergen Reactions  . Aricept [Donepezil Hcl]     Hives, vomiting and headache  . Namenda [Memantine Hcl]     hives  . Codeine   . Latex   . Penicillins Rash    History   Social History  . Marital Status: Divorced    Spouse Name: N/A    Number of Children: N/A  . Years of Education: N/A   Occupational History  . RETIRED FACTORY WORKER     [employer]   Social History Main Topics  . Smoking status: Former Smoker -- 0.50 packs/day for 40 years    Types: Cigarettes    Quit date: 03/30/2013  . Smokeless tobacco: Not on file  . Alcohol Use: No  . Drug Use: No  . Sexual Activity: No   Other Topics Concern  . 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 Started age 74.   Drug use -no   Alcohol use - no   Lives with son   Education - 64 th grade                   Family History  Problem Relation Age of Onset  . Heart failure Mother   . Cirrhosis Brother   . Lung cancer Father     Brain METS  . Kidney cancer Mother   . Kidney cancer Sister     Review of Systems:  As stated in the HPI and otherwise negative.   BP 120/60  Pulse 70  Wt 153 lb (69.4 kg)  Physical Examination: General: Well developed, well nourished, NAD HEENT: OP clear, mucus membranes moist SKIN: warm, dry. No rashes. Neuro: No focal deficits Musculoskeletal: Muscle strength 5/5 all ext Psychiatric: Mood and affect normal Neck: No JVD, no carotid bruits, no thyromegaly, no lymphadenopathy. Lungs:Clear bilaterally, no wheezes, rhonci, crackles Cardiovascular: Regular rate and rhythm. No murmurs, gallops or rubs. Abdomen:Soft. Bowel sounds present. Non-tender.  Extremities: No lower extremity edema. Pulses are 2 + in the bilateral DP/PT.  Echo 02/17/14: Left ventricle: The cavity size was normal. Wall thickness was increased in a pattern of severe LVH. There was mild focal basal hypertrophy of the septum. Systolic function was vigorous. The estimated ejection fraction was in the range of 65% to 70%. Wall motion was normal; there were no regional wall motion abnormalities. Doppler parameters are consistent with abnormal left ventricular relaxation (grade 1 diastolic dysfunction). - Left atrium: The atrium was mildly dilated. - Tricuspid valve: Moderate regurgitation. - Pulmonary arteries: Systolic pressure was mildly increased. Impressions:  - Elevated LVOT gradient of 2.9 m/s most likely related to vigorous LV function in association with proximal  septal thickening.  Assessment and Plan:   1. CAD: Mild CAD by cath May 2011. She is on a statin, ASA and Ace-inhibitor. No changes in therapy.     2. HYPERTENSION:  BP well controlled. No changes.     3. Dyspnea: Stable. Likely from her COPD. Followed in Pulmonary office.   4. Tobacco abuse: She has stopped smoking.   5. Aortic insufficiency: No significant regurgitation seen on echo April 2015.    6. SVT: No recurrence over last three weeks. Continue metoprolol 50 mg po BID

## 2014-05-15 NOTE — Patient Instructions (Signed)
Your physician wants you to follow-up in:  6 months. You will receive a reminder letter in the mail two months in advance. If you don't receive a letter, please call our office to schedule the follow-up appointment.   

## 2014-05-16 ENCOUNTER — Ambulatory Visit (HOSPITAL_COMMUNITY)
Admit: 2014-05-16 | Discharge: 2014-05-16 | Disposition: A | Discharge status: Home / Self Care | Payer: Medicare Other | Source: Ambulatory Visit | Attending: Internal Medicine | Admitting: Internal Medicine

## 2014-05-16 DIAGNOSIS — R1011 Right upper quadrant pain: Secondary | ICD-10-CM | POA: Insufficient documentation

## 2014-05-16 DIAGNOSIS — K5900 Constipation, unspecified: Secondary | ICD-10-CM | POA: Diagnosis not present

## 2014-05-16 DIAGNOSIS — K8689 Other specified diseases of pancreas: Secondary | ICD-10-CM | POA: Insufficient documentation

## 2014-05-16 DIAGNOSIS — I7090 Unspecified atherosclerosis: Secondary | ICD-10-CM | POA: Insufficient documentation

## 2014-05-16 DIAGNOSIS — N289 Disorder of kidney and ureter, unspecified: Secondary | ICD-10-CM | POA: Insufficient documentation

## 2014-05-16 LAB — GLUCOSE, CAPILLARY
Glucose-Capillary: 164 mg/dL — ABNORMAL HIGH (ref 70–99)
Glucose-Capillary: 183 mg/dL — ABNORMAL HIGH (ref 70–99)
Glucose-Capillary: 210 mg/dL — ABNORMAL HIGH (ref 70–99)
Glucose-Capillary: 201 mg/dL — ABNORMAL HIGH (ref 70–99)

## 2014-05-16 MED ORDER — IOHEXOL 300 MG/ML  SOLN
100.0000 mL | Freq: Once | INTRAMUSCULAR | Status: AC | PRN
Start: 2014-05-16 — End: 2014-05-16
  Administered 2014-05-16: 100 mL via INTRAVENOUS

## 2014-05-17 ENCOUNTER — Ambulatory Visit (INDEPENDENT_AMBULATORY_CARE_PROVIDER_SITE_OTHER): Payer: Self-pay | Admitting: Orthopedic Surgery

## 2014-05-17 DIAGNOSIS — S72009D Fracture of unspecified part of neck of unspecified femur, subsequent encounter for closed fracture with routine healing: Secondary | ICD-10-CM

## 2014-05-17 DIAGNOSIS — S72001D Fracture of unspecified part of neck of right femur, subsequent encounter for closed fracture with routine healing: Secondary | ICD-10-CM

## 2014-05-17 LAB — GLUCOSE, CAPILLARY
Glucose-Capillary: 186 mg/dL — ABNORMAL HIGH (ref 70–99)
Glucose-Capillary: 143 mg/dL — ABNORMAL HIGH (ref 70–99)
Glucose-Capillary: 188 mg/dL — ABNORMAL HIGH (ref 70–99)
Glucose-Capillary: 201 mg/dL — ABNORMAL HIGH (ref 70–99)
Glucose-Capillary: 269 mg/dL — ABNORMAL HIGH (ref 70–99)

## 2014-05-17 NOTE — Progress Notes (Signed)
Patient ID: [name], female   DOB: 1931-05-09, 80 y.o.   MRN: [id number]  Followup bipolar right hip 03/28/2014. X-ray on June 1 and June 4 showed no abnormality in the right hip prosthesis  Patient complains of pain after therapy which has continued. She does have therapy she has a very sore tender right thigh and pain in the right iliac crest area. Her daughter tells me that prior to her hip fracture she had frequent giving way episodes of the right leg and she noted abnormal posture when she was standing and sitting. She also was only bed to chair with pivoting.  I would like to decrease the amount of therapy she is getting in terms of the walking distance area prior to surgery she was stand and pivot she should be returned to that amount of activity.  Followup 6 weeks therapy adjustments as noted

## 2014-05-18 LAB — GLUCOSE, CAPILLARY
Glucose-Capillary: 205 mg/dL — ABNORMAL HIGH (ref 70–99)
Glucose-Capillary: 201 mg/dL — ABNORMAL HIGH (ref 70–99)
Glucose-Capillary: 217 mg/dL — ABNORMAL HIGH (ref 70–99)
Glucose-Capillary: 193 mg/dL — ABNORMAL HIGH (ref 70–99)
Glucose-Capillary: 225 mg/dL — ABNORMAL HIGH (ref 70–99)

## 2014-05-19 LAB — GLUCOSE, CAPILLARY
Glucose-Capillary: 126 mg/dL — ABNORMAL HIGH (ref 70–99)
Glucose-Capillary: 190 mg/dL — ABNORMAL HIGH (ref 70–99)
Glucose-Capillary: 199 mg/dL — ABNORMAL HIGH (ref 70–99)
Glucose-Capillary: 255 mg/dL — ABNORMAL HIGH (ref 70–99)

## 2014-05-20 LAB — GLUCOSE, CAPILLARY
Glucose-Capillary: 78 mg/dL (ref 70–99)
Glucose-Capillary: 141 mg/dL — ABNORMAL HIGH (ref 70–99)
Glucose-Capillary: 225 mg/dL — ABNORMAL HIGH (ref 70–99)
Glucose-Capillary: 241 mg/dL — ABNORMAL HIGH (ref 70–99)

## 2014-05-21 LAB — GLUCOSE, CAPILLARY
Glucose-Capillary: 77 mg/dL (ref 70–99)
Glucose-Capillary: 146 mg/dL — ABNORMAL HIGH (ref 70–99)

## 2014-05-21 NOTE — Progress Notes (Addendum)
Patient ID: [name], female   DOB: 1931-06-24, 80 y.o.   MRN: [id number]                 PROGRESS NOTE  DATE:  05/14/2014      FACILITY: [facility]    LEVEL OF CARE:   SNF   Acute Visit   CHIEF COMPLAINT:  Continued follow-up of cholestatic pattern to her liver function tests.     HISTORY OF PRESENT ILLNESS:  I am re-reviewing [name] with regards to this problem.  Her liver function tests today show an alk phos of 1004, an AST of 96, an ALT of 90.  This pattern seems to have started during her index hospitalization at which time she had a right hip fracture and underwent a right hip arthroplasty.     Postoperatively, she developed acute diastolic heart failure.  She then had to be readmitted to hospital for PSVT, all before I had ever seen her.    I noted the elevated pattern of predominantly her alk phos.  I did an ultrasound of her liver on 05/04/2014.   This showed a prior cholecystectomy with no acute findings.  Her common bile duct had a normal caliber at 4 mm.    I stopped her spironolactone two weeks ago, as well; not completely certain whether this could cause a cholestatic pattern, but it has not affected this.  She is on Lipitor, but I am not aware that this can cause cholestasis, either.  With regards to the rest of her medications, I am not aware that they caused this pattern of abnormality.    Fortunately, the patient does not appear to be at all symptomatic.  She is not complaining of abdominal pain, nausea or vomiting.  Her bilirubin has remained normal.     PHYSICAL EXAMINATION:   GASTROINTESTINAL:  LIVER/SPLEEN/KIDNEYS:  There is no liver or spleen palpable.  No tenderness.    ASSESSMENT/PLAN:  Cholestatic liver function test abnormalities.  I am not completely certain of the cause of this.  The normal ultrasound is somewhat reassuring, although partial obstruction can sometimes be missed in this area towards this end.  I think she needs a CT  scan of her abdomen, focusing on the distal common bile duct/pancreas.  I am also going to arrange a follow-up consultation with Gastroenterology.  I will confirm the alk phos of liver with a GGT, although I am fairly certain that this is the source.

## 2014-05-22 LAB — GLUCOSE, CAPILLARY
Glucose-Capillary: 285 mg/dL — ABNORMAL HIGH (ref 70–99)
Glucose-Capillary: 273 mg/dL — ABNORMAL HIGH (ref 70–99)
Glucose-Capillary: 108 mg/dL — ABNORMAL HIGH (ref 70–99)
Glucose-Capillary: 193 mg/dL — ABNORMAL HIGH (ref 70–99)
Glucose-Capillary: 162 mg/dL — ABNORMAL HIGH (ref 70–99)

## 2014-05-23 LAB — GLUCOSE, CAPILLARY
Glucose-Capillary: 210 mg/dL — ABNORMAL HIGH (ref 70–99)
Glucose-Capillary: 81 mg/dL (ref 70–99)
Glucose-Capillary: 98 mg/dL (ref 70–99)
Glucose-Capillary: 134 mg/dL — ABNORMAL HIGH (ref 70–99)
Glucose-Capillary: 291 mg/dL — ABNORMAL HIGH (ref 70–99)
Glucose-Capillary: 281 mg/dL — ABNORMAL HIGH (ref 70–99)

## 2014-05-24 LAB — GLUCOSE, CAPILLARY
Glucose-Capillary: 140 mg/dL — ABNORMAL HIGH (ref 70–99)
Glucose-Capillary: 159 mg/dL — ABNORMAL HIGH (ref 70–99)
Glucose-Capillary: 289 mg/dL — ABNORMAL HIGH (ref 70–99)
Glucose-Capillary: 304 mg/dL — ABNORMAL HIGH (ref 70–99)

## 2014-05-25 ENCOUNTER — Encounter: Payer: Self-pay | Admitting: Internal Medicine

## 2014-05-25 ENCOUNTER — Non-Acute Institutional Stay (SKILLED_NURSING_FACILITY): Payer: Medicare Other | Admitting: Internal Medicine

## 2014-05-25 DIAGNOSIS — R7402 Elevation of levels of lactic acid dehydrogenase (LDH): Secondary | ICD-10-CM

## 2014-05-25 DIAGNOSIS — R609 Edema, unspecified: Secondary | ICD-10-CM

## 2014-05-25 DIAGNOSIS — I10 Essential (primary) hypertension: Secondary | ICD-10-CM | POA: Diagnosis not present

## 2014-05-25 DIAGNOSIS — I5031 Acute diastolic (congestive) heart failure: Secondary | ICD-10-CM

## 2014-05-25 DIAGNOSIS — IMO0001 Reserved for inherently not codable concepts without codable children: Secondary | ICD-10-CM

## 2014-05-25 DIAGNOSIS — I2510 Atherosclerotic heart disease of native coronary artery without angina pectoris: Secondary | ICD-10-CM

## 2014-05-25 DIAGNOSIS — I471 Supraventricular tachycardia: Secondary | ICD-10-CM | POA: Diagnosis not present

## 2014-05-25 DIAGNOSIS — I509 Heart failure, unspecified: Secondary | ICD-10-CM

## 2014-05-25 DIAGNOSIS — I209 Angina pectoris, unspecified: Secondary | ICD-10-CM | POA: Diagnosis not present

## 2014-05-25 DIAGNOSIS — R740 Nonspecific elevation of levels of transaminase and lactic acid dehydrogenase [LDH]: Secondary | ICD-10-CM

## 2014-05-25 DIAGNOSIS — E1165 Type 2 diabetes mellitus with hyperglycemia: Secondary | ICD-10-CM

## 2014-05-25 DIAGNOSIS — I25119 Atherosclerotic heart disease of native coronary artery with unspecified angina pectoris: Secondary | ICD-10-CM

## 2014-05-25 DIAGNOSIS — R7401 Elevation of levels of liver transaminase levels: Secondary | ICD-10-CM

## 2014-05-25 DIAGNOSIS — D649 Anemia, unspecified: Secondary | ICD-10-CM

## 2014-05-25 LAB — GLUCOSE, CAPILLARY
Glucose-Capillary: 120 mg/dL — ABNORMAL HIGH (ref 70–99)
Glucose-Capillary: 162 mg/dL — ABNORMAL HIGH (ref 70–99)
Glucose-Capillary: 216 mg/dL — ABNORMAL HIGH (ref 70–99)
Glucose-Capillary: 191 mg/dL — ABNORMAL HIGH (ref 70–99)

## 2014-05-25 NOTE — Progress Notes (Signed)
Patient ID: [name], female   DOB: 03-31-1931, 80 y.o.   MRN: [id number]   This is a routine visit.  Level care skills.  Facility [facility]  Chief complaint medical management of right hip fracture with repair-diastolic CHF-PSVT-depression-elevated liver function tests-diabetes type 2-depression-.  History of present illness patient is a pleasant 80 year old female with the above diagnoses-she came here about a month ago for rehabilitation after sustaining a right hip fracture that was surgically repaired-she is followed by orthopedics and they have actually written recommendation earlier this month to somewhat reduce her therapy regiment I think secondary to pain of her right hip and this appears to have benefited her-there are orders for gait training in room only  Her family thinks she had some increased edema of her right lower leg and foot tonight this appears to have largely resolved with elevation per nursinin her postop course was dominated by Q. diastolic heart failure she also had right lower lobe atelectasis or pneumonia and delirium these largely covers all.  She was readmitted to the hospital and found to be in PSVT this was treated with Cardizem and Lopressor she did not have any further symptoms  In the hospital it was noted she had some elevated liver function tests with an alkaline phosphatase of 692 AST 49 ALT 33-in early June pattern was the same at one point apparently she had normal liver function tests this was back in in April.  Most recent liver function tests on June 29 showed an alkaline phosphatase of 1004 ALT of 90 AST of 96-an abdominal ultrasound and CT scan of the abdomen have been largely unremarkable-she apparently has a GI consult pending-she does not appear to be symptomatic fairly seeding better no abdominal pain nausea or vomiting noted.  Regards to her diastolic CHF her weight has been stable here for the last several weeks that she does not complaining of  increased shortness of breath or cough-she is on Lasix her Aldactone was DC'd secondary to concerns this may be affecting her liver function  I do note she recently saw her cardiologist on June 29 and said she was stable and a was thought to be on lisinopril however she is not currently on the lisinopril.  She is on Plavix for anticoagulation as per cardiology it appears there was thought she was on aspirin per the note I see on June 29-she also is on a statin again with a history of coronary artery disease.  Patient is doing fairly well he is progressing with therapy does not really have any complaints tonight her vital signs remained stable.  Family medical social history as been reviewed per numerous progress notes including cardiology note on 05/14/2014-progress notes including 05/12/2014 and 04/26/2014  Medications have been reviewed per [facility].  Review of systems.  In general no complaints of fever or chills.   eyes does not complain of visual changes.  Ear nose mouth and throat no complaints of sore throat or dysphasia.  Respiratory does not complaining of shortness of breath or cough.  Cardiac no chest pain has some mild lower extremity edema.  GI elevated liver function tests but does not complaining of any nausea vomiting diarrhea constipation or abdominal discomfort.  Musculoskeletal is status post hip fracture with repair apparently pain is controlled but orthopedics has apparently recommended a bit less intensive therapy.  Neurologic does not complaining of dizziness headache or numbness.  Psych she does have a history of depression but this appears to be relatively well  controlled.  Physical exam.  Temperature is 98.1 pulse 70 respirations 20 blood pressure 140/72-137/76-154/76 recently.  In general this is a somewhat obese elderly female in no distress resting comfortably in bed.  Her skin is warm and dry.  Eyes-sclerae conjunctivae clear pupils appear reactive to  light visual acuity appears grossly intact.  Oropharynx is clear mucous membranes moist.   chest is clear to auscultation there is no labored breathing.  Heart is regular rate and rhythm without murmur gallop or rub she has some slight increased edema of her right leg with a positive pedal pulse per nursing staff the says decreased since she has had her leg elevated as it is nontender and nonerythematous.   GI-abdomen is soft nontender somewhat obese with positive bowel sounds  Muscle skeletal lose all extremities x4 again limited exam since patient is in bed but appears to be grossly within her normal range I do not note any deformities.   Neurologic appears grossly intact without lateralizing findings her speech is clear.  Psych she appears grossly alert and oriented pleasant and appropriate.  Labs.  05/14/2014.  CBC 5.7 hemoglobin 12.2 platelets 332.  Sodium 140 potassium 3.5 BUN 11 creatinine 0.59.  Alkaline phosphatase 1004 AST 96-ALT 90   assessment and plan.  #1 elevated liver function tests-will write an order to make sure GI consult is pending also will update labs-clinically she appears to be largely asymptomatic-again ultrasound and CT scan have been fairly unremarkable.  #1-TAVWPVX of CHF diastolic-this appears relatively well compensated she is on Lasix will update a metabolic panel her Aldactone again has been DC'd secondary to liver cancer.  #3 coronary artery disease this appears stable she is on Plavix as well as a statin is not on lisinopril currently I will write ordersto see. where this was discontinued --if it was because of low blood pressure we'll restart this at a lower dose since her blood pressures appear to be borderline high--per cardiology note patient is on aspirin although I do not see this on her med list this will have to be clarified  #4-history of mild right leg edema this does not appear to be acutely concerning I suspect dependent related but  will order a venous Doppler  out of  caution.  . #5-history of PSVT this is stable rate controlled on Lopressor.  Number 6- of anemia this appears to be fairly mild we'll update a CBC.  #7 history of diabetes-she is on NovoLog before meals as well as Lantus each bedtime morning blood sugars are quite variable ranging from high 70s to mid 100s-later that day also quite variable ranging from 90s largely to mid 100s average somewhat higher at 4 PM with blood sugars largely in the 200s range-also appears to be more in the 200s at at bedtime--will order a hemoglobin A1c before making medication adjustments--consider increasing the NovoLog before later meals would like to see where we stand with the A1c   #8 past history of GERD-like symptoms she is on Nexium apparently this is helping  #9-depression this appears to be stable on current medications apparently she is eating better more interactive continues to be pleasant and appropriate.  [id number] note greater than 40 minutes spent assessing patient-reviewing her chart-previous progress notes  including cardiology notes-and coordinating and formulating a plan of care for numerous diagnoses-of note greater than 50% of time spent coordinating plan of care .  ..     .  Marland Kitchen

## 2014-05-27 LAB — GLUCOSE, CAPILLARY
Glucose-Capillary: 96 mg/dL (ref 70–99)
Glucose-Capillary: 216 mg/dL — ABNORMAL HIGH (ref 70–99)
Glucose-Capillary: 240 mg/dL — ABNORMAL HIGH (ref 70–99)
Glucose-Capillary: 220 mg/dL — ABNORMAL HIGH (ref 70–99)
Glucose-Capillary: 135 mg/dL — ABNORMAL HIGH (ref 70–99)
Glucose-Capillary: 217 mg/dL — ABNORMAL HIGH (ref 70–99)
Glucose-Capillary: 320 mg/dL — ABNORMAL HIGH (ref 70–99)
Glucose-Capillary: 299 mg/dL — ABNORMAL HIGH (ref 70–99)

## 2014-05-28 ENCOUNTER — Non-Acute Institutional Stay (SKILLED_NURSING_FACILITY): Payer: Medicare Other | Admitting: Internal Medicine

## 2014-05-28 ENCOUNTER — Ambulatory Visit (HOSPITAL_COMMUNITY)
Admit: 2014-05-28 | Discharge: 2014-05-28 | Disposition: A | Discharge status: Home / Self Care | Payer: No Typology Code available for payment source | Source: Ambulatory Visit | Attending: Internal Medicine | Admitting: Internal Medicine

## 2014-05-28 DIAGNOSIS — L89609 Pressure ulcer of unspecified heel, unspecified stage: Secondary | ICD-10-CM

## 2014-05-28 DIAGNOSIS — M7989 Other specified soft tissue disorders: Secondary | ICD-10-CM | POA: Insufficient documentation

## 2014-05-28 DIAGNOSIS — M79609 Pain in unspecified limb: Secondary | ICD-10-CM | POA: Diagnosis not present

## 2014-05-28 LAB — GLUCOSE, CAPILLARY
Glucose-Capillary: 77 mg/dL (ref 70–99)
Glucose-Capillary: 85 mg/dL (ref 70–99)
Glucose-Capillary: 216 mg/dL — ABNORMAL HIGH (ref 70–99)
Glucose-Capillary: 253 mg/dL — ABNORMAL HIGH (ref 70–99)

## 2014-05-29 LAB — GLUCOSE, CAPILLARY
Glucose-Capillary: 105 mg/dL — ABNORMAL HIGH (ref 70–99)
Glucose-Capillary: 214 mg/dL — ABNORMAL HIGH (ref 70–99)
Glucose-Capillary: 253 mg/dL — ABNORMAL HIGH (ref 70–99)
Glucose-Capillary: 172 mg/dL — ABNORMAL HIGH (ref 70–99)

## 2014-05-30 ENCOUNTER — Other Ambulatory Visit: Payer: Self-pay | Admitting: *Deleted

## 2014-05-30 LAB — GLUCOSE, CAPILLARY
Glucose-Capillary: 113 mg/dL — ABNORMAL HIGH (ref 70–99)
Glucose-Capillary: 141 mg/dL — ABNORMAL HIGH (ref 70–99)
Glucose-Capillary: 150 mg/dL — ABNORMAL HIGH (ref 70–99)
Glucose-Capillary: 206 mg/dL — ABNORMAL HIGH (ref 70–99)

## 2014-05-30 MED ORDER — HYDROCODONE-ACETAMINOPHEN 5-325 MG PO TABS: ORAL_TABLET | ORAL | Status: DC

## 2014-05-30 NOTE — Telephone Encounter (Signed)
[name]

## 2014-05-31 LAB — GLUCOSE, CAPILLARY
Glucose-Capillary: 282 mg/dL — ABNORMAL HIGH (ref 70–99)
Glucose-Capillary: 225 mg/dL — ABNORMAL HIGH (ref 70–99)

## 2014-06-01 LAB — GLUCOSE, CAPILLARY
Glucose-Capillary: 75 mg/dL (ref 70–99)
Glucose-Capillary: 147 mg/dL — ABNORMAL HIGH (ref 70–99)

## 2014-06-02 LAB — GLUCOSE, CAPILLARY
Glucose-Capillary: 71 mg/dL (ref 70–99)
Glucose-Capillary: 121 mg/dL — ABNORMAL HIGH (ref 70–99)
Glucose-Capillary: 229 mg/dL — ABNORMAL HIGH (ref 70–99)
Glucose-Capillary: 250 mg/dL — ABNORMAL HIGH (ref 70–99)
Glucose-Capillary: 160 mg/dL — ABNORMAL HIGH (ref 70–99)
Glucose-Capillary: 126 mg/dL — ABNORMAL HIGH (ref 70–99)
Glucose-Capillary: 313 mg/dL — ABNORMAL HIGH (ref 70–99)
Glucose-Capillary: 301 mg/dL — ABNORMAL HIGH (ref 70–99)

## 2014-06-03 LAB — GLUCOSE, CAPILLARY
Glucose-Capillary: 164 mg/dL — ABNORMAL HIGH (ref 70–99)
Glucose-Capillary: 131 mg/dL — ABNORMAL HIGH (ref 70–99)
Glucose-Capillary: 195 mg/dL — ABNORMAL HIGH (ref 70–99)

## 2014-06-04 LAB — GLUCOSE, CAPILLARY
Glucose-Capillary: 337 mg/dL — ABNORMAL HIGH (ref 70–99)
Glucose-Capillary: 216 mg/dL — ABNORMAL HIGH (ref 70–99)

## 2014-06-04 NOTE — Progress Notes (Signed)
Patient ID: [name], female   DOB: Aug 02, 1931, 80 y.o.   MRN: [id number]               PROGRESS NOTE  DATE:  05/28/2014    FACILITY: [facility]    LEVEL OF CARE:   SNF   Acute Visit   HISTORY OF PRESENT ILLNESS:  [name] is a lady who came to [place] after a right hip fracture.  She had diastolic heart failure postoperatively and was readmitted to the hospital and found to be in PSVT.  She has not been problematic from either one of these issues.    She is being followed for cholestatic liver function test pictures, last checked on 05/26/2014.  Her alk phos was 911, AST 75, ALT 60.  I note her appointment with [facility] is not until 06/27/2014.  She has remained reasonably asymptomatic.  Her bilirubin is normal.  I have wondered whether to put her through a HIDA scan.  Her ultrasound and CT scans have not shown the cause of this.  However, I will wait for Gastroenterology in this otherwise asymptomatic patient.    In the meantime, she has developed a pressure ulcer on her right heel.  There is a fair amount of drainage coming out of this, although not obviously infected.     PHYSICAL EXAMINATION:   CIRCULATION:   ARTERIAL:  Right foot:  Peripheral pulses are palpable.   EDEMA/VARICOSITIES:  There was  apparently some edema at some point and a duplex ultrasound was ordered, although there is no evidence of a DVT here.    SKIN:  INSPECTION:   There is a stage II wound here with a lot of drainage and maceration.     ASSESSMENT/PLAN:  We are going to add silver alginate under the foam which can be changed three times a week.  I do not see any evidence of infection here currently.        CPT CODE: 94854

## 2014-06-05 LAB — GLUCOSE, CAPILLARY
Glucose-Capillary: 85 mg/dL (ref 70–99)
Glucose-Capillary: 82 mg/dL (ref 70–99)
Glucose-Capillary: 165 mg/dL — ABNORMAL HIGH (ref 70–99)
Glucose-Capillary: 133 mg/dL — ABNORMAL HIGH (ref 70–99)
Glucose-Capillary: 176 mg/dL — ABNORMAL HIGH (ref 70–99)
Glucose-Capillary: 103 mg/dL — ABNORMAL HIGH (ref 70–99)
Glucose-Capillary: 171 mg/dL — ABNORMAL HIGH (ref 70–99)

## 2014-06-06 LAB — GLUCOSE, CAPILLARY
Glucose-Capillary: 94 mg/dL (ref 70–99)
Glucose-Capillary: 158 mg/dL — ABNORMAL HIGH (ref 70–99)
Glucose-Capillary: 116 mg/dL — ABNORMAL HIGH (ref 70–99)
Glucose-Capillary: 178 mg/dL — ABNORMAL HIGH (ref 70–99)

## 2014-06-07 LAB — GLUCOSE, CAPILLARY
Glucose-Capillary: 59 mg/dL — ABNORMAL LOW (ref 70–99)
Glucose-Capillary: 88 mg/dL (ref 70–99)
Glucose-Capillary: 263 mg/dL — ABNORMAL HIGH (ref 70–99)
Glucose-Capillary: 393 mg/dL — ABNORMAL HIGH (ref 70–99)
Glucose-Capillary: 303 mg/dL — ABNORMAL HIGH (ref 70–99)

## 2014-06-08 LAB — GLUCOSE, CAPILLARY
Glucose-Capillary: 179 mg/dL — ABNORMAL HIGH (ref 70–99)
Glucose-Capillary: 85 mg/dL (ref 70–99)
Glucose-Capillary: 206 mg/dL — ABNORMAL HIGH (ref 70–99)
Glucose-Capillary: 89 mg/dL (ref 70–99)

## 2014-06-09 LAB — GLUCOSE, CAPILLARY
Glucose-Capillary: 72 mg/dL (ref 70–99)
Glucose-Capillary: 135 mg/dL — ABNORMAL HIGH (ref 70–99)

## 2014-06-10 LAB — GLUCOSE, CAPILLARY
Glucose-Capillary: 117 mg/dL — ABNORMAL HIGH (ref 70–99)
Glucose-Capillary: 158 mg/dL — ABNORMAL HIGH (ref 70–99)
Glucose-Capillary: 201 mg/dL — ABNORMAL HIGH (ref 70–99)
Glucose-Capillary: 120 mg/dL — ABNORMAL HIGH (ref 70–99)

## 2014-06-11 LAB — GLUCOSE, CAPILLARY
Glucose-Capillary: 266 mg/dL — ABNORMAL HIGH (ref 70–99)
Glucose-Capillary: 258 mg/dL — ABNORMAL HIGH (ref 70–99)
Glucose-Capillary: 377 mg/dL — ABNORMAL HIGH (ref 70–99)
Glucose-Capillary: 151 mg/dL — ABNORMAL HIGH (ref 70–99)

## 2014-06-12 LAB — GLUCOSE, CAPILLARY
Glucose-Capillary: 195 mg/dL — ABNORMAL HIGH (ref 70–99)
Glucose-Capillary: 141 mg/dL — ABNORMAL HIGH (ref 70–99)
Glucose-Capillary: 67 mg/dL — ABNORMAL LOW (ref 70–99)
Glucose-Capillary: 282 mg/dL — ABNORMAL HIGH (ref 70–99)
Glucose-Capillary: 224 mg/dL — ABNORMAL HIGH (ref 70–99)
Glucose-Capillary: 90 mg/dL (ref 70–99)

## 2014-06-14 LAB — GLUCOSE, CAPILLARY
Glucose-Capillary: 136 mg/dL — ABNORMAL HIGH (ref 70–99)
Glucose-Capillary: 86 mg/dL (ref 70–99)
Glucose-Capillary: 162 mg/dL — ABNORMAL HIGH (ref 70–99)
Glucose-Capillary: 147 mg/dL — ABNORMAL HIGH (ref 70–99)
Glucose-Capillary: 126 mg/dL — ABNORMAL HIGH (ref 70–99)
Glucose-Capillary: 150 mg/dL — ABNORMAL HIGH (ref 70–99)
Glucose-Capillary: 296 mg/dL — ABNORMAL HIGH (ref 70–99)
Glucose-Capillary: 246 mg/dL — ABNORMAL HIGH (ref 70–99)

## 2014-06-15 LAB — GLUCOSE, CAPILLARY
Glucose-Capillary: 111 mg/dL — ABNORMAL HIGH (ref 70–99)
Glucose-Capillary: 154 mg/dL — ABNORMAL HIGH (ref 70–99)
Glucose-Capillary: 111 mg/dL — ABNORMAL HIGH (ref 70–99)
Glucose-Capillary: 229 mg/dL — ABNORMAL HIGH (ref 70–99)

## 2014-06-16 LAB — GLUCOSE, CAPILLARY
Glucose-Capillary: 65 mg/dL — ABNORMAL LOW (ref 70–99)
Glucose-Capillary: 190 mg/dL — ABNORMAL HIGH (ref 70–99)

## 2014-06-17 LAB — GLUCOSE, CAPILLARY
Glucose-Capillary: 151 mg/dL — ABNORMAL HIGH (ref 70–99)
Glucose-Capillary: 87 mg/dL (ref 70–99)
Glucose-Capillary: 78 mg/dL (ref 70–99)
Glucose-Capillary: 160 mg/dL — ABNORMAL HIGH (ref 70–99)
Glucose-Capillary: 267 mg/dL — ABNORMAL HIGH (ref 70–99)
Glucose-Capillary: 227 mg/dL — ABNORMAL HIGH (ref 70–99)

## 2014-06-18 LAB — GLUCOSE, CAPILLARY
Glucose-Capillary: 147 mg/dL — ABNORMAL HIGH (ref 70–99)
Glucose-Capillary: 213 mg/dL — ABNORMAL HIGH (ref 70–99)
Glucose-Capillary: 139 mg/dL — ABNORMAL HIGH (ref 70–99)
Glucose-Capillary: 261 mg/dL — ABNORMAL HIGH (ref 70–99)

## 2014-06-19 LAB — GLUCOSE, CAPILLARY
Glucose-Capillary: 117 mg/dL — ABNORMAL HIGH (ref 70–99)
Glucose-Capillary: 139 mg/dL — ABNORMAL HIGH (ref 70–99)
Glucose-Capillary: 319 mg/dL — ABNORMAL HIGH (ref 70–99)
Glucose-Capillary: 251 mg/dL — ABNORMAL HIGH (ref 70–99)

## 2014-06-20 LAB — GLUCOSE, CAPILLARY
Glucose-Capillary: 174 mg/dL — ABNORMAL HIGH (ref 70–99)
Glucose-Capillary: 117 mg/dL — ABNORMAL HIGH (ref 70–99)
Glucose-Capillary: 267 mg/dL — ABNORMAL HIGH (ref 70–99)
Glucose-Capillary: 174 mg/dL — ABNORMAL HIGH (ref 70–99)

## 2014-06-21 LAB — GLUCOSE, CAPILLARY
Glucose-Capillary: 74 mg/dL (ref 70–99)
Glucose-Capillary: 145 mg/dL — ABNORMAL HIGH (ref 70–99)
Glucose-Capillary: 338 mg/dL — ABNORMAL HIGH (ref 70–99)
Glucose-Capillary: 188 mg/dL — ABNORMAL HIGH (ref 70–99)

## 2014-06-22 LAB — GLUCOSE, CAPILLARY
Glucose-Capillary: 94 mg/dL (ref 70–99)
Glucose-Capillary: 185 mg/dL — ABNORMAL HIGH (ref 70–99)
Glucose-Capillary: 125 mg/dL — ABNORMAL HIGH (ref 70–99)
Glucose-Capillary: 192 mg/dL — ABNORMAL HIGH (ref 70–99)

## 2014-06-23 LAB — GLUCOSE, CAPILLARY
Glucose-Capillary: 84 mg/dL (ref 70–99)
Glucose-Capillary: 192 mg/dL — ABNORMAL HIGH (ref 70–99)
Glucose-Capillary: 261 mg/dL — ABNORMAL HIGH (ref 70–99)

## 2014-06-24 LAB — GLUCOSE, CAPILLARY
Glucose-Capillary: 183 mg/dL — ABNORMAL HIGH (ref 70–99)
Glucose-Capillary: 72 mg/dL (ref 70–99)
Glucose-Capillary: 98 mg/dL (ref 70–99)
Glucose-Capillary: 196 mg/dL — ABNORMAL HIGH (ref 70–99)
Glucose-Capillary: 140 mg/dL — ABNORMAL HIGH (ref 70–99)

## 2014-06-25 LAB — GLUCOSE, CAPILLARY
Glucose-Capillary: 65 mg/dL — ABNORMAL LOW (ref 70–99)
Glucose-Capillary: 108 mg/dL — ABNORMAL HIGH (ref 70–99)
Glucose-Capillary: 233 mg/dL — ABNORMAL HIGH (ref 70–99)
Glucose-Capillary: 212 mg/dL — ABNORMAL HIGH (ref 70–99)
Glucose-Capillary: 138 mg/dL — ABNORMAL HIGH (ref 70–99)

## 2014-06-26 LAB — GLUCOSE, CAPILLARY
Glucose-Capillary: 104 mg/dL — ABNORMAL HIGH (ref 70–99)
Glucose-Capillary: 140 mg/dL — ABNORMAL HIGH (ref 70–99)
Glucose-Capillary: 197 mg/dL — ABNORMAL HIGH (ref 70–99)

## 2014-06-27 ENCOUNTER — Ambulatory Visit (INDEPENDENT_AMBULATORY_CARE_PROVIDER_SITE_OTHER): Payer: Medicare Other | Admitting: Gastroenterology

## 2014-06-27 ENCOUNTER — Non-Acute Institutional Stay (SKILLED_NURSING_FACILITY): Payer: Medicare Other | Admitting: Internal Medicine

## 2014-06-27 ENCOUNTER — Encounter: Payer: Self-pay | Admitting: Internal Medicine

## 2014-06-27 ENCOUNTER — Encounter: Payer: Self-pay | Admitting: Gastroenterology

## 2014-06-27 VITALS — BP 164/80 | HR 71 | Temp 98.4°F | Resp 18 | Ht 64.0 in

## 2014-06-27 DIAGNOSIS — R7401 Elevation of levels of liver transaminase levels: Secondary | ICD-10-CM

## 2014-06-27 DIAGNOSIS — R141 Gas pain: Secondary | ICD-10-CM | POA: Diagnosis not present

## 2014-06-27 DIAGNOSIS — R740 Nonspecific elevation of levels of transaminase and lactic acid dehydrogenase [LDH]: Secondary | ICD-10-CM

## 2014-06-27 DIAGNOSIS — R634 Abnormal weight loss: Secondary | ICD-10-CM | POA: Diagnosis not present

## 2014-06-27 DIAGNOSIS — I2510 Atherosclerotic heart disease of native coronary artery without angina pectoris: Secondary | ICD-10-CM

## 2014-06-27 DIAGNOSIS — S72001D Fracture of unspecified part of neck of right femur, subsequent encounter for closed fracture with routine healing: Secondary | ICD-10-CM

## 2014-06-27 DIAGNOSIS — R143 Flatulence: Secondary | ICD-10-CM

## 2014-06-27 DIAGNOSIS — E1165 Type 2 diabetes mellitus with hyperglycemia: Secondary | ICD-10-CM

## 2014-06-27 DIAGNOSIS — R140 Abdominal distension (gaseous): Secondary | ICD-10-CM

## 2014-06-27 DIAGNOSIS — R7402 Elevation of levels of lactic acid dehydrogenase (LDH): Secondary | ICD-10-CM

## 2014-06-27 DIAGNOSIS — I10 Essential (primary) hypertension: Secondary | ICD-10-CM

## 2014-06-27 DIAGNOSIS — IMO0001 Reserved for inherently not codable concepts without codable children: Secondary | ICD-10-CM | POA: Diagnosis not present

## 2014-06-27 DIAGNOSIS — S72009D Fracture of unspecified part of neck of unspecified femur, subsequent encounter for closed fracture with routine healing: Secondary | ICD-10-CM | POA: Diagnosis not present

## 2014-06-27 DIAGNOSIS — R142 Eructation: Secondary | ICD-10-CM

## 2014-06-27 DIAGNOSIS — D649 Anemia, unspecified: Secondary | ICD-10-CM

## 2014-06-27 LAB — GLUCOSE, CAPILLARY
Glucose-Capillary: 146 mg/dL — ABNORMAL HIGH (ref 70–99)
Glucose-Capillary: 68 mg/dL — ABNORMAL LOW (ref 70–99)
Glucose-Capillary: 86 mg/dL (ref 70–99)
Glucose-Capillary: 168 mg/dL — ABNORMAL HIGH (ref 70–99)
Glucose-Capillary: 181 mg/dL — ABNORMAL HIGH (ref 70–99)

## 2014-06-27 MED ORDER — URSODIOL 500 MG PO TABS: 500.0000 mg | ORAL_TABLET | Freq: Two times a day (BID) | ORAL | Status: DC

## 2014-06-27 NOTE — Addendum Note (Signed)
Addended by: Danie Binder on: 06/27/2014 11:34 AM   Modules accepted: Orders

## 2014-06-27 NOTE — Progress Notes (Signed)
Patient ID: [name], female   DOB: 1931/07/21, 80 y.o.   MRN: [id number]   this is a discharge note Level care skills.  Facility  [facility]   Chief complaint---Discharge note  History of present illness.  Patient is a pleasant 80 year old female with a history of  right hip fracture with repair-diastolic CHF-PSVT-depression-elevated liver function tests-diabetes type 2-depression-. -she came here about a month ago for rehabilitation after sustaining a right hip fracture that was surgically repaired-she is followed by orthopedics      her postop course was dominated by. diastolic heart failure she also had right lower lobe atelectasis or pneumonia .  She was readmitted to the hospital and found to be in PSVT this was treated with Cardizem and Lopressor she did not have any further symptoms  In the hospital it was noted she had some elevated liver function tests with an alkaline phosphatase of 692 AST 49 ALT 33-in early June -- at one point apparently she had normal liver function tests this was back in in April.  Most recent liver function tests on  July 11 showed an alkaline phosphatase of 911-AST 75-ALT 60-an abdominal ultrasound and CT scan of the abdomen have been largely unremarkable- had a GI consult today and she was started on Urso  forte-with followup scheduled of her labs  Regards to complaints of bloating she was started on Prilosec with recommendation to discontinue her Zantac-also started on a probiotic  t.  Regards to her diastolic CHF her weight has been stable here for the last several weeks that she does not complaining of increased shortness of breath or cough-she is on Lasix her Aldactone was DC'd secondary to concerns this may be affecting her liver function  I do note she recently saw her cardiologist on June 29 and said she was stable   She is on Plavix for anticoagulation -she also is on a statin again with a history of coronary artery disease  She is a type II diabetic  blood sugars somewhat variable although I do see some more frequent 60s 70s--up to mid 100s in the morning-later in the day quite variable mainly low mid 100s at noon-125-2 higher 200s at 4:30 again there's quite a bit of variability here--at night likewise there is variability with at bedtime blood sugars running 87-to the mid 200s.  She is on Lantus 20 units each bedtime as well as NovoLog 3 units with meals if CBGs greater than 150.  I do note she appears to have some elevated systolics at times in the 140-180s range-she is on lisinopril 10 mg a day-as well as Lopressor 50 mg twice a day.      .   .  Family medical social history as been reviewed per numerous progress notes i   Medications have been reviewed per [facility]. including 05/28/2014-and 04/26/2049   Review of systems.  In general no complaints of fever or chills.  eyes does not complain of visual changes.  Ear nose mouth and throat no complaints of sore throat or dysphasia.  Respiratory does not complaining of shortness of breath or cough.  Cardiac no chest pain has some mild lower extremity edema--right greater than left although this appears to be improved.  GI elevated liver function tests but does not complaining of any nausea vomiting diarrhea constipation or abdominal discomfort other than bloating feeling.  Musculoskeletal is status post hip fracture with repair --she is now ambulating with a walker although still quite weak.  Neurologic does  not complaining of dizziness headache or numbness.  Psych she does have a history of depression but this appears to be relatively well controlled.   Physical exam.  Temperature 98.5 pulse 66 respirations 18 blood pressure taken manually 166/84.--Weight is stable at 154  In general this is a somewhat obese elderly female in no distress sitting comfortably in her wheelchair  Her skin is warm and dry. --She does have a history of a right heel ulcer-apparently this is healing  unremarkably-with topical treatment--is currently covered with a small dressing Eyes-sclerae conjunctivae clear pupils appear reactive to light visual acuity appears grossly intact.  Oropharynx is clear mucous membranes moist.  chest is clear to auscultation there is no labored breathing.  Heart is regular rate and rhythm without murmur gallop or rub --she continues to have some right greater than left lower extremity edema although this appears to be improving-pedal pulses are intact edema is non-tender non-erythematous non-warm   GI-abdomen is soft nontender somewhat obese with positive bowel sounds  Muscle skeletal--moves all extremities x4-is able to stand without assistance but is quite weak and unsteady-is ambulating with a walker again she is quite weak with this    Neurologic appears grossly intact without lateralizing findings her speech is clear.  Psych she appears grossly alert and oriented pleasant and appropriate.    Labs. 06/25/2014.  Sodium 141 potassium 3.4 BUN 10 creatinine 0.553   05/26/2014.  WBC 6.3 hemoglobin 11.9 platelets 386.  Sodium 142 potassium 3.8 BUN 9 creatinine 0.6.  Bilirubin 0.7-alk phosphatase 911-AST 75-ALT 60-albumin 2.8    05/14/2014.  CBC 5.7 hemoglobin 12.2 platelets 332.  Sodium 140 potassium 3.5 BUN 11 creatinine 0.59.  Alkaline phosphatase 1004 AST 96-ALT 90   Assessment and plan.   #1 elevated liver function tests-she continues to be relatively asymptomatic again she has seen GI earlier today for this -- they will follow   #2-VOJJKKX of CHF diastolic-this appears relatively well compensated she is on Lasix will update a metabolic panel her Aldactone again has been DC'd secondary to liver concerns--I do note a potassium of 3.2 on August 5 she was started on potassium 20 mEq a day-- potassium 2 days ago was 3.4-and potassium was increased to 30 mEq a day-however with potassium almost normalized at 20 meq she had only been on it for 5 days  will reduce the potassium to 20 mEq-again check a metabolic panel tomorrow as well as next week to ensure stability.  #3 coronary artery disease this appears stable she is on Plavix as well as a statin-as well as lisinopril- also on a beta blocker--secondary to  some elevated blood pressures we'll increase her lisinopril to 20 mg a day with followup by cardiology and primary care provider--also per review of cardiology note n late June and apparently thought she was on aspirin-will have nursing staff tried to contact cardio to clarify   #4-history of mild right leg edema this does not appear to be acutely concerning and improving gas venous Doppler has been negative for DVT  . #5-history of PSVT this is stable rate controlled on Lopressor.  Number 6- of anemia this appears to be fairly mild we'll update a CBC.  #7 history of diabetes-she is on NovoLog before meals as well as Lantus each bedtime morning blood sugars are quite variable noted above-however some concern with the 60s in the morning-we'll decrease her Lantus to 17 units and have this monitored at home I discussed this as well with her daughter who  is with her mother in the room today  c  #8 past history of GERD-like symptoms GI has recommended starting Prilosec-also a probiotic has been started for complaints of bloating   #9-depression this appears to be stable on current medications apparently she is eating better more interactive continues to be pleasant and appropriate -- Anxiety-she is on Xanax as needed. She did not take this real often .   [id number] note greater than 30 minutes spent on this discharge summary .  ..  Marland Kitchen

## 2014-06-27 NOTE — Patient Instructions (Addendum)
TAKE A PROBIOTIC DAILY FOR ONE MONTH ([place], [place]). STOP IT IF YOU FEEL IT'S NOT MAKING A DIFFERENCE.  AVOID FOODS THAT CAUSE BLOATING/GAS.  SEE INFO BELOW.  STOP RANITIDINE.  ADD PRILOSEC DAILY.  USE COLACE TWICE DAILY.  START URSO FORTE TWICE DAILY.   GET LABS DRAWN. FOLLOW UP IN 4 MOS.   BLOATING AND GAS PREVENTION  Although gas may be uncomfortable and embarrassing, it is not life-threatening. Understanding causes, ways to reduce symptoms, and treatment will help most people find some relief. Points to remember   Everyone has gas in the digestive tract.   People often believe normal passage of gas to be excessive.   Gas comes from two main sources: swallowed air and normal breakdown of certain foods by harmless bacteria naturally present in the large intestine.   Many foods with carbohydrates can cause gas. Fats and proteins cause little gas.   Foods that may cause gas include o beans  o vegetables, such as broccoli, cabbage, brussels sprouts, onions, artichokes, and asparagus  o fruits, such as pears, apples, and peaches  o whole grains, such as whole wheat and bran  o soft drinks and fruit drinks  o milk and milk products, such as cheese and ice cream, and packaged foods prepared with lactose, such as bread, cereal, and salad dressing  o foods containing sorbitol, such as dietetic foods and sugar free candies and gums   The most common symptoms of gas are belching, flatulence, bloating, and abdominal pain. However, some of these symptoms are often caused by an intestinal disorder, such as irritable bowel syndrome, rather than too much gas.   The most common ways to reduce the discomfort of gas are changing diet, taking nonprescription medicines, and reducing the amount of air swallowed.   Digestive enzymes, such as lactase supplements, actually help digest carbohydrates and may allow people to eat foods that normally cause gas.

## 2014-06-27 NOTE — Assessment & Plan Note (Signed)
MAY BE DUE TO FOOD INTOLERANCE OR BACTERIAL OVERGROWTH IN SETTING OF DIABETES/AGE.  TAKE A PROBIOTIC DAILY FOR ONE MONTH ([place], [place]). STOP IT IF YOU FEEL IT'S NOT MAKING A DIFFERENCE. AVOID FOODS THAT CAUSE BLOATING/GAS. HO GIVEN. OPV IN 4 MOS

## 2014-06-27 NOTE — Assessment & Plan Note (Signed)
WEIGHT UP TO 154 LBS.  CONTINUE TO MONITOR SYMPTOMS.

## 2014-06-27 NOTE — Progress Notes (Signed)
Reminder in epic °

## 2014-06-27 NOTE — Assessment & Plan Note (Signed)
ALK PHOS NOW UP TO 892(Apr 18 2014)  REPEAT LABS. ADD URSO FORTE BID OPV IN 4 MOS

## 2014-06-27 NOTE — Progress Notes (Signed)
   Subjective:    Patient ID: [name], female    DOB: 1931-08-12, 80 y.o.   MRN: [id number]  [name], MD  HPI Appetite is better. HAS BLOATING AND THINKS IT MAY BE THE FOOD. STOMACH DOESN'T "FEEL RIGHT". FEELS LIKE SHE HAS TO GO TO THE BR A LOT AND IT'S JUST GAS. OCCASIONAL CONSTIPATION.   PT DENIES FEVER, CHILLS, HEMATOCHEZIA, nausea, vomiting, melena, diarrhea, CHEST PAIN, SHORTNESS OF BREATH,  CHANGE IN BOWEL IN HABITS, abdominal pain, problems swallowing,OR heartburn or indigestion.  Past Medical History  Diagnosis Date  . History of GI bleed   . Parotid tumor   . History of breast cancer   . Type 2 diabetes mellitus   . Osteopenia   . Essential hypertension, benign   . Hyperlipidemia   . Depression   . Anxiety   . Chronic headaches   . Carotid artery disease     Bilateral ICA occlusion  . Orthostatic hypotension     Diabetic polyneuropathy/diabetic dysautonomia   . Coronary atherosclerosis of native coronary artery     Mild nonobstructive disease at cardiac catheterization May 2011  . PSVT (paroxysmal supraventricular tachycardia)   . Acute diastolic congestive heart failure 03/21/2014  . Fracture of femoral neck, right, closed 03/28/2014  . Acute delirium 06/23/2013  . Syncope and collapse 04/15/2010    Qualifier: Diagnosis of  By: Angelena Form, MD, Harrell Gave    . Pressure ulcer, heel, right, unstageable 04/17/2014    Past Surgical History  Procedure Laterality Date  . Mastectomy  1994    Left breast -related to breast cancer  . Vesicovaginal fistula closure w/ tah  1979  . Cholecystectomy  2008  . Hemorrhoid surgery  1960s  . Laparoscopic appendectomy N/A 04/13/2013    Procedure: APPENDECTOMY LAPAROSCOPIC;  Surgeon: Donato Heinz, MD;  Location: [facility];  Service: General;  Laterality: N/A;  . Colonoscopy  2010    Dr. Oneida Alar: single tubular adenoma, one hyperplastic polyp. Next TCS 2020 health permitting.  . Hip arthroplasty Right 03/28/2014    Procedure:  ARTHROPLASTY BIPOLAR HIP;  Surgeon: Carole Civil, MD;  Location: [facility];  Service: Orthopedics;  Laterality: Right;   Allergies  Allergen Reactions  . Aricept [Donepezil Hcl]     Hives, vomiting and headache  . Namenda [Memantine Hcl]     hives  . Codeine   . Latex   . Penicillins Rash    Review of Systems     Objective:   Physical Exam  Vitals reviewed. Constitutional: She is oriented to person, place, and time. She appears well-developed and well-nourished. No distress.  HENT:  Head: Normocephalic and atraumatic.  Mouth/Throat: Oropharynx is clear and moist.  Eyes: Pupils are equal, round, and reactive to light. No scleral icterus.  Neck: Normal range of motion. Neck supple.  Cardiovascular: Normal rate, regular rhythm and normal heart sounds.   Pulmonary/Chest: Effort normal and breath sounds normal. No respiratory distress.  Abdominal: Soft. Bowel sounds are normal. She exhibits no distension. There is no tenderness.  EXAM LIMITED-PT IN [facility]   Musculoskeletal: She exhibits edema (BIL LE).  Lymphadenopathy:    She has no cervical adenopathy.  Neurological: She is alert and oriented to person, place, and time.  NO  NEW FOCAL DEFICITS   Psychiatric:  FLAT AFFECT, NL MOOD           Assessment & Plan:

## 2014-06-28 ENCOUNTER — Telehealth: Payer: Self-pay | Admitting: Cardiovascular Disease

## 2014-06-28 ENCOUNTER — Ambulatory Visit (INDEPENDENT_AMBULATORY_CARE_PROVIDER_SITE_OTHER): Payer: Self-pay | Admitting: Orthopedic Surgery

## 2014-06-28 ENCOUNTER — Ambulatory Visit (HOSPITAL_COMMUNITY): Payer: Medicare Other | Attending: Orthopedic Surgery

## 2014-06-28 VITALS — BP 125/68 | Ht 64.0 in | Wt 154.0 lb

## 2014-06-28 DIAGNOSIS — S72001D Fracture of unspecified part of neck of right femur, subsequent encounter for closed fracture with routine healing: Secondary | ICD-10-CM

## 2014-06-28 DIAGNOSIS — Z471 Aftercare following joint replacement surgery: Secondary | ICD-10-CM | POA: Diagnosis not present

## 2014-06-28 DIAGNOSIS — Z96649 Presence of unspecified artificial hip joint: Secondary | ICD-10-CM | POA: Insufficient documentation

## 2014-06-28 DIAGNOSIS — M1610 Unilateral primary osteoarthritis, unspecified hip: Secondary | ICD-10-CM | POA: Insufficient documentation

## 2014-06-28 DIAGNOSIS — S72009D Fracture of unspecified part of neck of unspecified femur, subsequent encounter for closed fracture with routine healing: Secondary | ICD-10-CM

## 2014-06-28 DIAGNOSIS — M169 Osteoarthritis of hip, unspecified: Secondary | ICD-10-CM | POA: Diagnosis not present

## 2014-06-28 MED ORDER — PREDNISONE (PAK) 5 MG PO TABS: ORAL_TABLET | ORAL | Status: DC

## 2014-06-28 NOTE — Telephone Encounter (Signed)
New message     Pt is on plavix, lipitor and lisinopril.  Will Dr Angelena Form want patient to take aspirin also?

## 2014-06-28 NOTE — Patient Instructions (Signed)
Continue with walker

## 2014-06-28 NOTE — Telephone Encounter (Signed)
Spoke with [place] at [facility]. The patient is being discharged and will go home tomorrow. PA wants to know if Dr.McAlhany wants her taking ASA every day in addition to Plavix. She has not been taking ASA since the hip fracture. Advised will ask MD next week and call the patient back at home.

## 2014-06-28 NOTE — Telephone Encounter (Signed)
I would continue Plavix alone without ASA. cdm

## 2014-06-28 NOTE — Progress Notes (Signed)
Chief Complaint  Patient presents with  . Follow-up    6 week follow up, bipolar right hip for right hip fracture, DOS 03/28/14    Three-month postop right bipolar hip      Patient still complains of groin pain. X-rays are normal  The only thing that has made any significant relief in this problem is removing the pad from her wheelchair and allowing her hips to be in less flexion. She's also taking Vicodin for pain.  She was a nonambulator bed to chair prior to operation and she's actually become an ambulator now that the surgery has been done although minimal with a walker.  Recommend dose pack to address hip inflammation continue Norco for pain  Follow up in a month

## 2014-06-29 LAB — GLUCOSE, CAPILLARY
Glucose-Capillary: 152 mg/dL — ABNORMAL HIGH (ref 70–99)
Glucose-Capillary: 72 mg/dL (ref 70–99)
Glucose-Capillary: 238 mg/dL — ABNORMAL HIGH (ref 70–99)
Glucose-Capillary: 205 mg/dL — ABNORMAL HIGH (ref 70–99)

## 2014-07-01 DIAGNOSIS — I509 Heart failure, unspecified: Secondary | ICD-10-CM | POA: Diagnosis not present

## 2014-07-01 DIAGNOSIS — Z96641 Presence of right artificial hip joint: Secondary | ICD-10-CM | POA: Diagnosis not present

## 2014-07-01 DIAGNOSIS — E119 Type 2 diabetes mellitus without complications: Secondary | ICD-10-CM | POA: Diagnosis not present

## 2014-07-01 DIAGNOSIS — Z471 Aftercare following joint replacement surgery: Secondary | ICD-10-CM | POA: Diagnosis not present

## 2014-07-01 DIAGNOSIS — I5033 Acute on chronic diastolic (congestive) heart failure: Secondary | ICD-10-CM | POA: Diagnosis not present

## 2014-07-01 DIAGNOSIS — D649 Anemia, unspecified: Secondary | ICD-10-CM | POA: Diagnosis not present

## 2014-07-01 DIAGNOSIS — L89620 Pressure ulcer of left heel, unstageable: Secondary | ICD-10-CM | POA: Diagnosis not present

## 2014-07-01 DIAGNOSIS — I5032 Chronic diastolic (congestive) heart failure: Secondary | ICD-10-CM | POA: Diagnosis not present

## 2014-07-02 LAB — GLUCOSE, CAPILLARY
Glucose-Capillary: 125 mg/dL — ABNORMAL HIGH (ref 70–99)
Glucose-Capillary: 256 mg/dL — ABNORMAL HIGH (ref 70–99)

## 2014-07-03 ENCOUNTER — Telehealth: Payer: Self-pay

## 2014-07-03 NOTE — Telephone Encounter (Signed)
Pt's daughter, [name], left a VM that she had a question about the [employer]. When I returned her call, everything was ok. [facility] that they did not have the prescription and then they found it file away.

## 2014-07-04 ENCOUNTER — Other Ambulatory Visit: Payer: Self-pay

## 2014-07-04 MED ORDER — URSODIOL 500 MG PO TABS: 500.0000 mg | ORAL_TABLET | Freq: Two times a day (BID) | ORAL | Status: DC

## 2014-07-04 NOTE — Telephone Encounter (Signed)
[facility] has it in [facility], drilling

## 2014-07-04 NOTE — Addendum Note (Signed)
Addended by: Marlou Porch on: 07/04/2014 04:42 PM   Modules accepted: Orders

## 2014-07-04 NOTE — Telephone Encounter (Signed)
Spoke with pt's son and gave him instructions from [name]

## 2014-07-04 NOTE — Telephone Encounter (Signed)
[facility] is temporarily out of stock Please advise

## 2014-07-04 NOTE — Telephone Encounter (Signed)
Please send it to [facility]

## 2014-07-06 MED ORDER — URSODIOL 500 MG PO TABS: 500.0000 mg | ORAL_TABLET | Freq: Two times a day (BID) | ORAL | Status: DC

## 2014-07-06 NOTE — Telephone Encounter (Signed)
Done.

## 2014-07-06 NOTE — Addendum Note (Signed)
Addended by: Orvil Feil on: 07/06/2014 12:30 PM   Modules accepted: Orders

## 2014-07-11 DIAGNOSIS — F0390 Unspecified dementia without behavioral disturbance: Secondary | ICD-10-CM | POA: Diagnosis not present

## 2014-07-11 DIAGNOSIS — E119 Type 2 diabetes mellitus without complications: Secondary | ICD-10-CM | POA: Diagnosis not present

## 2014-07-11 DIAGNOSIS — I10 Essential (primary) hypertension: Secondary | ICD-10-CM | POA: Diagnosis not present

## 2014-07-17 DIAGNOSIS — E119 Type 2 diabetes mellitus without complications: Secondary | ICD-10-CM | POA: Diagnosis not present

## 2014-07-24 DIAGNOSIS — L608 Other nail disorders: Secondary | ICD-10-CM | POA: Diagnosis not present

## 2014-07-24 DIAGNOSIS — E119 Type 2 diabetes mellitus without complications: Secondary | ICD-10-CM | POA: Diagnosis not present

## 2014-07-24 DIAGNOSIS — M7989 Other specified soft tissue disorders: Secondary | ICD-10-CM | POA: Diagnosis not present

## 2014-07-24 DIAGNOSIS — M79609 Pain in unspecified limb: Secondary | ICD-10-CM | POA: Diagnosis not present

## 2014-07-26 ENCOUNTER — Ambulatory Visit (INDEPENDENT_AMBULATORY_CARE_PROVIDER_SITE_OTHER): Payer: Medicare Other | Admitting: Otolaryngology

## 2014-07-26 ENCOUNTER — Telehealth: Payer: Self-pay | Admitting: Orthopedic Surgery

## 2014-07-26 DIAGNOSIS — D37039 Neoplasm of uncertain behavior of the major salivary glands, unspecified: Secondary | ICD-10-CM

## 2014-07-26 NOTE — Telephone Encounter (Signed)
Routing to Dr Aline Brochure

## 2014-07-26 NOTE — Telephone Encounter (Signed)
Stacy/[facility] said [name] therapy order expires this week  Would like to add 3 more weeks of PT, but needs a new order.

## 2014-07-26 NOTE — Telephone Encounter (Signed)
OK GIVE HER A NEW ORDER

## 2014-07-27 NOTE — Telephone Encounter (Signed)
LEFT VM WITH VERBAL ORDER

## 2014-07-31 ENCOUNTER — Ambulatory Visit (INDEPENDENT_AMBULATORY_CARE_PROVIDER_SITE_OTHER): Payer: Self-pay | Admitting: Orthopedic Surgery

## 2014-07-31 ENCOUNTER — Telehealth: Payer: Self-pay | Admitting: Orthopedic Surgery

## 2014-07-31 VITALS — BP 123/68 | Ht 64.0 in | Wt 154.0 lb

## 2014-07-31 DIAGNOSIS — M948X9 Other specified disorders of cartilage, unspecified sites: Secondary | ICD-10-CM

## 2014-07-31 DIAGNOSIS — M898X9 Other specified disorders of bone, unspecified site: Secondary | ICD-10-CM

## 2014-07-31 DIAGNOSIS — S72009D Fracture of unspecified part of neck of unspecified femur, subsequent encounter for closed fracture with routine healing: Secondary | ICD-10-CM

## 2014-07-31 DIAGNOSIS — S72001D Fracture of unspecified part of neck of right femur, subsequent encounter for closed fracture with routine healing: Secondary | ICD-10-CM

## 2014-07-31 MED ORDER — INDOMETHACIN 25 MG PO CAPS: 25.0000 mg | ORAL_CAPSULE | Freq: Three times a day (TID) | ORAL | Status: DC

## 2014-07-31 NOTE — Telephone Encounter (Signed)
Routing to Dr Harrison 

## 2014-07-31 NOTE — Telephone Encounter (Signed)
YES CONTINUE

## 2014-07-31 NOTE — Progress Notes (Signed)
Chief Complaint  Patient presents with  . Follow-up    1 month recheck right bipolar hip, DOS 03/28/14    Patient continues to complain of groin pain  X-rays show grade 4 heterotopic ossification including the [place] is muscle. This is a large lesion. I'll put her on an and hope for some relief if not we may have to consult a hip specialist and/or give her radiation  She has groin pain on flexion extension she has groin pain which radiates down the anterior thigh. No back pain no posterior thigh pain.  Start Meds ordered this encounter  Medications  . indomethacin (INDOCIN) 25 MG capsule    Sig: Take 1 capsule (25 mg total) by mouth 3 (three) times daily with meals.    Dispense:  120 capsule    Refill:  0

## 2014-08-01 NOTE — Telephone Encounter (Signed)
[name] states it is too painful for patient to sit on toilet for extended periods of time for bowel movements, states straining to have bowel movement hurts in her hips down her legs,what can she do about this? Also states patient has red and blue area on right calf and states it hurts.

## 2014-08-01 NOTE — Telephone Encounter (Signed)
TAKE STOOOL SOFTENERS   AND MIRALAX DAILY

## 2014-08-06 NOTE — Telephone Encounter (Signed)
Advised daughter of below, she also states that the indomethacin really helped pain but developed rash and itching and stopped, is there another med like indomethacin? Is there another med to stop the bone growth and control pain.

## 2014-08-07 NOTE — Telephone Encounter (Signed)
Start naproxen 500 mg twice a day #60 with 2 refills refills

## 2014-08-08 ENCOUNTER — Other Ambulatory Visit: Payer: Self-pay | Admitting: *Deleted

## 2014-08-08 ENCOUNTER — Telehealth: Payer: Self-pay

## 2014-08-08 MED ORDER — NAPROXEN 500 MG PO TABS: 500.0000 mg | ORAL_TABLET | Freq: Two times a day (BID) | ORAL | Status: DC

## 2014-08-08 NOTE — Telephone Encounter (Signed)
T/C from pt's daughter, Erina Hamme, [phone]), said if pt is to stay on the ursodiol please send prescription to [facility] for 90 day supply. [name] is aware that I will call when I hear from Dr. Oneida Alar.

## 2014-08-08 NOTE — Telephone Encounter (Signed)
Med sent to pharmacy, patients daughter aware

## 2014-08-13 MED ORDER — URSODIOL 500 MG PO TABS: 500.0000 mg | ORAL_TABLET | Freq: Two times a day (BID) | ORAL | Status: DC

## 2014-08-13 NOTE — Telephone Encounter (Signed)
PLEASE CALL PT'S DAUGHTER. THE RX HAS BEEN SENT TO [facility].

## 2014-08-13 NOTE — Telephone Encounter (Signed)
LMOM that Rx has been sent.

## 2014-08-16 DIAGNOSIS — S6991XA Unspecified injury of right wrist, hand and finger(s), initial encounter: Secondary | ICD-10-CM | POA: Diagnosis not present

## 2014-08-16 DIAGNOSIS — S6981XA Other specified injuries of right wrist, hand and finger(s), initial encounter: Secondary | ICD-10-CM | POA: Diagnosis not present

## 2014-08-16 DIAGNOSIS — R600 Localized edema: Secondary | ICD-10-CM | POA: Diagnosis not present

## 2014-08-27 ENCOUNTER — Other Ambulatory Visit: Payer: Self-pay | Admitting: *Deleted

## 2014-08-27 MED ORDER — NAPROXEN 500 MG PO TABS: 500.0000 mg | ORAL_TABLET | Freq: Two times a day (BID) | ORAL | Status: DC

## 2014-08-28 DIAGNOSIS — Z23 Encounter for immunization: Secondary | ICD-10-CM | POA: Diagnosis not present

## 2014-08-29 ENCOUNTER — Telehealth: Payer: Self-pay

## 2014-08-29 NOTE — Telephone Encounter (Signed)
Pt's daughter left VM that the [facility] is on back order at mail order and they need Rx sent to [facility].

## 2014-08-30 ENCOUNTER — Telehealth: Payer: Self-pay | Admitting: Orthopedic Surgery

## 2014-08-30 MED ORDER — URSODIOL 500 MG PO TABS: 500.0000 mg | ORAL_TABLET | Freq: Two times a day (BID) | ORAL | Status: DC

## 2014-08-30 NOTE — Telephone Encounter (Signed)
This was refaxed to them this morning

## 2014-08-30 NOTE — Telephone Encounter (Signed)
Completed.

## 2014-08-30 NOTE — Telephone Encounter (Signed)
Pt's daughter called this morning and said her mom also needs Prilosec Rx sent to the pharmacy. She said her PCP gave Rx once but it was for only once daily. She said that Dr. Oneida Alar was giving it to her tid ( #90 at the time to take one before each meal). She also needs this sent to [facility].

## 2014-08-30 NOTE — Telephone Encounter (Signed)
[facility]ie [facility] called and has a problem with Ms. Jolin Naproxen please advise, Ph # [phone]

## 2014-08-31 MED ORDER — PANTOPRAZOLE SODIUM 40 MG PO TBEC: 40.0000 mg | DELAYED_RELEASE_TABLET | Freq: Every day | ORAL | Status: DC

## 2014-08-31 NOTE — Telephone Encounter (Signed)
We don't do Prilosec TID. Per Dr. Oneida Alar' last note, Prilosec was just once daily. As she is on Plavix, I am changing her to Protonix once daily. [facility] has already been sent.

## 2014-08-31 NOTE — Telephone Encounter (Signed)
Called and informed pt's daughter, [name].

## 2014-08-31 NOTE — Addendum Note (Signed)
Addended by: Orvil Feil on: 08/31/2014 08:47 AM   Modules accepted: Orders, Medications

## 2014-09-07 ENCOUNTER — Other Ambulatory Visit (HOSPITAL_COMMUNITY): Payer: Self-pay | Admitting: Internal Medicine

## 2014-09-07 DIAGNOSIS — Z1231 Encounter for screening mammogram for malignant neoplasm of breast: Secondary | ICD-10-CM

## 2014-09-11 ENCOUNTER — Encounter: Payer: Self-pay | Admitting: Orthopedic Surgery

## 2014-09-11 ENCOUNTER — Ambulatory Visit (INDEPENDENT_AMBULATORY_CARE_PROVIDER_SITE_OTHER): Payer: Medicare Other | Admitting: Orthopedic Surgery

## 2014-09-11 VITALS — BP 100/68 | Ht 64.0 in | Wt 154.0 lb

## 2014-09-11 DIAGNOSIS — M898X9 Other specified disorders of bone, unspecified site: Secondary | ICD-10-CM

## 2014-09-11 DIAGNOSIS — S72001D Fracture of unspecified part of neck of right femur, subsequent encounter for closed fracture with routine healing: Secondary | ICD-10-CM

## 2014-09-11 DIAGNOSIS — I2510 Atherosclerotic heart disease of native coronary artery without angina pectoris: Secondary | ICD-10-CM

## 2014-09-11 DIAGNOSIS — M948X9 Other specified disorders of cartilage, unspecified sites: Secondary | ICD-10-CM | POA: Diagnosis not present

## 2014-09-11 MED ORDER — HYDROCODONE-ACETAMINOPHEN 5-325 MG PO TABS: ORAL_TABLET | ORAL | Status: DC

## 2014-09-11 NOTE — Progress Notes (Signed)
Patient ID: [name], female   DOB: [birth year], 80 y.o.   MRN: [id number] Chief Complaint  Patient presents with  . Follow-up    6 week recheck Right hip, DOS 03/28/14   BP 100/68  Ht _0  (1.626 m)  Wt 154 lb (69.854 kg)  BMI 26.42 kg/m2  The patient had bipolar hip replacement for femoral neck fracture on May 13 of this year developed heterotopic ossification grade 4 in the gluteus muscles and the iliopsoas muscle. We tried her on Indocin but she was allergic to it we put her on naproxen 500 twice a day and she did much better. She's managed now with Norco 5 mg every 4 when necessary and naproxen 500 twice a day. Now she can walk on her own even without the walker. She can now sit on the toilet. And she can also sit comfortably.  Review of systems no other complaints.  Impression improved hip pain status post hip fracture and bipolar replacement with complication of heterotopic ossification.  No orders of the defined types were placed in this encounter.   Continue Norco and naproxen follow-up in 3 months

## 2014-09-11 NOTE — Patient Instructions (Signed)
Continue naproxen and norco

## 2014-09-13 ENCOUNTER — Telehealth: Payer: Self-pay | Admitting: Orthopedic Surgery

## 2014-09-13 NOTE — Telephone Encounter (Signed)
ROUTING TO DR Aline Brochure

## 2014-09-13 NOTE — Telephone Encounter (Signed)
Remind me about this next week

## 2014-09-13 NOTE — Telephone Encounter (Signed)
Patient's son Elgie Collard") called with question; states, although patient was just seen here and evaluated 09/11/14, that his mother complained of right hip hurting when getting in and out of car. Please advise. He is with her at home, [phone].

## 2014-09-18 NOTE — Telephone Encounter (Signed)
REMINDER

## 2014-09-25 ENCOUNTER — Encounter: Payer: Self-pay | Admitting: Gastroenterology

## 2014-09-25 DIAGNOSIS — E1140 Type 2 diabetes mellitus with diabetic neuropathy, unspecified: Secondary | ICD-10-CM | POA: Diagnosis not present

## 2014-09-25 DIAGNOSIS — B351 Tinea unguium: Secondary | ICD-10-CM | POA: Diagnosis not present

## 2014-09-25 DIAGNOSIS — L859 Epidermal thickening, unspecified: Secondary | ICD-10-CM | POA: Diagnosis not present

## 2014-10-01 ENCOUNTER — Ambulatory Visit (HOSPITAL_COMMUNITY)
Admission: RE | Admit: 2014-10-01 | Discharge: 2014-10-01 | Disposition: A | Discharge status: Home / Self Care | Payer: Medicare Other | Source: Ambulatory Visit | Attending: Internal Medicine | Admitting: Internal Medicine

## 2014-10-01 DIAGNOSIS — Z1231 Encounter for screening mammogram for malignant neoplasm of breast: Secondary | ICD-10-CM | POA: Diagnosis not present

## 2014-10-09 DIAGNOSIS — I10 Essential (primary) hypertension: Secondary | ICD-10-CM | POA: Diagnosis not present

## 2014-10-09 DIAGNOSIS — D649 Anemia, unspecified: Secondary | ICD-10-CM | POA: Diagnosis not present

## 2014-10-09 DIAGNOSIS — E538 Deficiency of other specified B group vitamins: Secondary | ICD-10-CM | POA: Diagnosis not present

## 2014-10-09 DIAGNOSIS — K5900 Constipation, unspecified: Secondary | ICD-10-CM | POA: Diagnosis not present

## 2014-10-09 DIAGNOSIS — E118 Type 2 diabetes mellitus with unspecified complications: Secondary | ICD-10-CM | POA: Diagnosis not present

## 2014-10-09 DIAGNOSIS — E119 Type 2 diabetes mellitus without complications: Secondary | ICD-10-CM | POA: Diagnosis not present

## 2014-10-09 DIAGNOSIS — D81819 Biotin-dependent carboxylase deficiency, unspecified: Secondary | ICD-10-CM | POA: Diagnosis not present

## 2014-10-16 DIAGNOSIS — Z794 Long term (current) use of insulin: Secondary | ICD-10-CM | POA: Diagnosis not present

## 2014-10-16 DIAGNOSIS — Z961 Presence of intraocular lens: Secondary | ICD-10-CM | POA: Diagnosis not present

## 2014-10-16 DIAGNOSIS — E119 Type 2 diabetes mellitus without complications: Secondary | ICD-10-CM | POA: Diagnosis not present

## 2014-10-24 ENCOUNTER — Inpatient Hospital Stay (HOSPITAL_COMMUNITY)
Admission: EM | Admit: 2014-10-24 | Discharge: 2014-10-28 | DRG: 392 | Disposition: A | Discharge status: Home / Self Care | Payer: Medicare Other | Attending: Family Medicine | Admitting: Family Medicine

## 2014-10-24 ENCOUNTER — Encounter (HOSPITAL_COMMUNITY): Payer: Self-pay | Admitting: Emergency Medicine

## 2014-10-24 DIAGNOSIS — K625 Hemorrhage of anus and rectum: Secondary | ICD-10-CM | POA: Diagnosis not present

## 2014-10-24 DIAGNOSIS — Z853 Personal history of malignant neoplasm of breast: Secondary | ICD-10-CM | POA: Diagnosis not present

## 2014-10-24 DIAGNOSIS — Z7952 Long term (current) use of systemic steroids: Secondary | ICD-10-CM | POA: Diagnosis not present

## 2014-10-24 DIAGNOSIS — Z801 Family history of malignant neoplasm of trachea, bronchus and lung: Secondary | ICD-10-CM | POA: Diagnosis not present

## 2014-10-24 DIAGNOSIS — Z794 Long term (current) use of insulin: Secondary | ICD-10-CM | POA: Diagnosis not present

## 2014-10-24 DIAGNOSIS — K529 Noninfective gastroenteritis and colitis, unspecified: Secondary | ICD-10-CM | POA: Insufficient documentation

## 2014-10-24 DIAGNOSIS — E119 Type 2 diabetes mellitus without complications: Secondary | ICD-10-CM | POA: Diagnosis not present

## 2014-10-24 DIAGNOSIS — Z79899 Other long term (current) drug therapy: Secondary | ICD-10-CM

## 2014-10-24 DIAGNOSIS — Z96641 Presence of right artificial hip joint: Secondary | ICD-10-CM | POA: Diagnosis present

## 2014-10-24 DIAGNOSIS — E1142 Type 2 diabetes mellitus with diabetic polyneuropathy: Secondary | ICD-10-CM | POA: Diagnosis present

## 2014-10-24 DIAGNOSIS — R112 Nausea with vomiting, unspecified: Secondary | ICD-10-CM | POA: Diagnosis not present

## 2014-10-24 DIAGNOSIS — I2510 Atherosclerotic heart disease of native coronary artery without angina pectoris: Secondary | ICD-10-CM | POA: Diagnosis present

## 2014-10-24 DIAGNOSIS — K7460 Unspecified cirrhosis of liver: Secondary | ICD-10-CM | POA: Diagnosis present

## 2014-10-24 DIAGNOSIS — Z791 Long term (current) use of non-steroidal anti-inflammatories (NSAID): Secondary | ICD-10-CM

## 2014-10-24 DIAGNOSIS — Z8249 Family history of ischemic heart disease and other diseases of the circulatory system: Secondary | ICD-10-CM | POA: Diagnosis not present

## 2014-10-24 DIAGNOSIS — D72829 Elevated white blood cell count, unspecified: Secondary | ICD-10-CM | POA: Diagnosis present

## 2014-10-24 DIAGNOSIS — Z7902 Long term (current) use of antithrombotics/antiplatelets: Secondary | ICD-10-CM

## 2014-10-24 DIAGNOSIS — Z808 Family history of malignant neoplasm of other organs or systems: Secondary | ICD-10-CM | POA: Diagnosis not present

## 2014-10-24 DIAGNOSIS — I5032 Chronic diastolic (congestive) heart failure: Secondary | ICD-10-CM | POA: Diagnosis present

## 2014-10-24 DIAGNOSIS — I25119 Atherosclerotic heart disease of native coronary artery with unspecified angina pectoris: Secondary | ICD-10-CM | POA: Diagnosis present

## 2014-10-24 DIAGNOSIS — Z90710 Acquired absence of both cervix and uterus: Secondary | ICD-10-CM

## 2014-10-24 DIAGNOSIS — N179 Acute kidney failure, unspecified: Secondary | ICD-10-CM | POA: Diagnosis present

## 2014-10-24 DIAGNOSIS — Z9010 Acquired absence of unspecified breast and nipple: Secondary | ICD-10-CM | POA: Diagnosis present

## 2014-10-24 DIAGNOSIS — E785 Hyperlipidemia, unspecified: Secondary | ICD-10-CM | POA: Diagnosis present

## 2014-10-24 DIAGNOSIS — I252 Old myocardial infarction: Secondary | ICD-10-CM

## 2014-10-24 DIAGNOSIS — Z87891 Personal history of nicotine dependence: Secondary | ICD-10-CM

## 2014-10-24 DIAGNOSIS — I10 Essential (primary) hypertension: Secondary | ICD-10-CM | POA: Diagnosis not present

## 2014-10-24 DIAGNOSIS — A09 Infectious gastroenteritis and colitis, unspecified: Secondary | ICD-10-CM | POA: Diagnosis not present

## 2014-10-24 DIAGNOSIS — R1110 Vomiting, unspecified: Secondary | ICD-10-CM | POA: Diagnosis not present

## 2014-10-24 DIAGNOSIS — K6389 Other specified diseases of intestine: Secondary | ICD-10-CM | POA: Diagnosis not present

## 2014-10-24 HISTORY — DX: Liver disease, unspecified: K76.9

## 2014-10-24 MED ORDER — SODIUM CHLORIDE 0.9 % IV BOLUS (SEPSIS)
500.0000 mL | Freq: Once | INTRAVENOUS | Status: AC
  Administered 2014-10-25: 500 mL via INTRAVENOUS

## 2014-10-24 NOTE — ED Notes (Signed)
Pt c/o n/v/d with rectal bleeding.

## 2014-10-24 NOTE — ED Provider Notes (Signed)
CSN: [id number]     Arrival date & time 10/24/14  2319 History  This chart was scribed for Maudry Diego, MD by Einar Pheasant, ED Scribe. This patient was seen in room APA14/APA14 and the patient's care was started at 11:51 PM.    Chief Complaint  Patient presents with  . Rectal Bleeding   Patient is a 80 y.o. female presenting with hematochezia. The history is provided by the patient. No language interpreter was used.  Rectal Bleeding Quality:  Bright red Amount:  Moderate Duration:  6 hours Timing:  Intermittent Progression:  Worsening Chronicity:  New Context: diarrhea   Similar prior episodes: no   Relieved by:  None tried Worsened by:  Nothing tried Ineffective treatments:  None tried Associated symptoms: vomiting   Associated symptoms: no abdominal pain   Risk factors: anticoagulant use   Risk factors comment:  Pt is on Plavix and has a hx of GI bleed  HPI Comments: [name] is a 80 y.o. female with a PMhx of GI bleeds presents to the Emergency Department complaining of sudden onset rectal bleeding which [name] first started today. Pt is also complaining of associated nausea, emesis, and diarrhea. Daughter states that [name] noted pt shake while having a bowel movement. [name] endorses approximately 6 episodes of diarrhea and 4 episodes of emesis. Denies any fatigue, congestion, cough, SOB, abdominal pain, or back pain.   Past Medical History  Diagnosis Date  . History of GI bleed   . Parotid tumor   . History of breast cancer   . Type 2 diabetes mellitus   . Osteopenia   . Essential hypertension, benign   . Hyperlipidemia   . Depression   . Anxiety   . Chronic headaches   . Carotid artery disease     Bilateral ICA occlusion  . Orthostatic hypotension     Diabetic polyneuropathy/diabetic dysautonomia   . Coronary atherosclerosis of native coronary artery     Mild nonobstructive disease at cardiac catheterization May 2011  . PSVT (paroxysmal supraventricular  tachycardia)   . Acute diastolic congestive heart failure 03/21/2014  . Fracture of femoral neck, right, closed 03/28/2014  . Acute delirium 06/23/2013  . Syncope and collapse 04/15/2010    Qualifier: Diagnosis of  By: Angelena Form, MD, Harrell Gave    . Pressure ulcer, heel, right, unstageable 04/17/2014  . UPPER GASTROINTESTINAL HEMORRHAGE 03/21/2007    Qualifier: Diagnosis of  By: Jonna Munro MD, Roderic Scarce     Past Surgical History  Procedure Laterality Date  . Mastectomy  1994    Left breast -related to breast cancer  . Vesicovaginal fistula closure w/ tah  1979  . Cholecystectomy  2008  . Hemorrhoid surgery  1960s  . Laparoscopic appendectomy N/A 04/13/2013    Procedure: APPENDECTOMY LAPAROSCOPIC;  Surgeon: Donato Heinz, MD;  Location: [facility];  Service: General;  Laterality: N/A;  . Colonoscopy  2010    Dr. Oneida Alar: single tubular adenoma, one hyperplastic polyp. Next TCS 2020 health permitting.  . Hip arthroplasty Right 03/28/2014    Procedure: ARTHROPLASTY BIPOLAR HIP;  Surgeon: Carole Civil, MD;  Location: [facility];  Service: Orthopedics;  Laterality: Right;   Family History  Problem Relation Age of Onset  . Heart failure Mother   . Cirrhosis Brother   . Lung cancer Father     Brain METS  . Kidney cancer Mother   . Kidney cancer Sister    History  Substance Use Topics  . Smoking status: Former Smoker --  0.50 packs/day for 40 years    Types: Cigarettes    Quit date: 03/30/2013  . Smokeless tobacco: Not on file  . Alcohol Use: No   OB History    No data available     Review of Systems  Constitutional: Negative for appetite change and fatigue.  HENT: Negative for congestion, ear discharge and sinus pressure.   Eyes: Negative for discharge.  Respiratory: Negative for cough.   Cardiovascular: Negative for chest pain.  Gastrointestinal: Positive for nausea, vomiting, diarrhea, hematochezia and anal bleeding. Negative for abdominal pain.  Genitourinary: Negative for frequency  and hematuria.  Musculoskeletal: Negative for back pain.  Skin: Negative for rash.  Neurological: Negative for seizures and headaches.  Psychiatric/Behavioral: Negative for hallucinations.  All other systems reviewed and are negative.     Allergies  Aricept; Namenda; Codeine; Latex; Indocin; and Penicillins  Home Medications   Prior to Admission medications   Medication Sig Start Date End Date Taking? Authorizing Provider  ALPRAZolam Duanne Moron) 0.25 MG tablet Take one tablet by mouth twice daily as needed for anxiety 04/23/14  Yes Tiffany L Reed, DO  atorvastatin (LIPITOR) 10 MG tablet Take 1 tablet (10 mg total) by mouth daily. 02/20/14  Yes [name], MD  cholecalciferol (VITAMIN D) 1000 UNITS tablet Take 1,000 Units by mouth every morning.    Yes Historical Provider, MD  citalopram (CELEXA) 10 MG tablet Take 1 tablet (10 mg total) by mouth daily. 04/20/14  Yes Kathie Dike, MD  clopidogrel (PLAVIX) 75 MG tablet Take 1 tablet (75 mg total) by mouth daily with breakfast. 02/20/14  Yes [name], MD  docusate sodium 100 MG CAPS Take 100 mg by mouth 2 (two) times daily. 03/30/14  Yes [name], MD  feeding supplement, GLUCERNA SHAKE, (GLUCERNA SHAKE) LIQD Take 237 mLs by mouth 3 (three) times daily between meals. 03/30/14  Yes [name], MD  furosemide (LASIX) 40 MG tablet Take 1 tablet (40 mg total) by mouth daily. 03/30/14  Yes [name], MD  HYDROcodone-acetaminophen (NORCO/VICODIN) 5-325 MG per tablet Take one tablet by mouth every 4 hours as needed for pain 09/11/14  Yes Carole Civil, MD  insulin aspart (NOVOLOG) 100 UNIT/ML injection Inject into the skin 3 (three) times daily before meals.   Yes Historical Provider, MD  insulin glargine (LANTUS) 100 UNIT/ML injection Inject 17 Units into the skin at bedtime.    Yes Historical Provider, MD  lisinopril (PRINIVIL,ZESTRIL) 20 MG tablet Take 20 mg by mouth daily.   Yes Historical Provider, MD   Methylfol-Algae-B12-Acetylcyst (CEREFOLIN NAC) 6-90.314-2-600 MG TABS Take by mouth.   Yes Historical Provider, MD  metoprolol (LOPRESSOR) 50 MG tablet Take 1 tablet (50 mg total) by mouth 2 (two) times daily. 04/20/14  Yes Kathie Dike, MD  mirtazapine (REMERON) 30 MG tablet Take 30 mg by mouth at bedtime.   Yes Historical Provider, MD  naproxen (NAPROSYN) 500 MG tablet Take 1 tablet (500 mg total) by mouth 2 (two) times daily with a meal. 08/27/14  Yes Carole Civil, MD  pantoprazole (PROTONIX) 40 MG tablet Take 1 tablet (40 mg total) by mouth daily. 08/31/14  Yes Orvil Feil, NP  potassium chloride (KLOR-CON) 20 MEQ packet Take by mouth 2 (two) times daily.   Yes Historical Provider, MD  tiotropium (SPIRIVA) 18 MCG inhalation capsule Place 18 mcg into inhaler and inhale daily as needed (shortness of breath).   Yes Historical Provider, MD  ursodiol (URSO FORTE) 500 MG tablet  Take 1 tablet (500 mg total) by mouth 2 (two) times daily with a meal. 08/30/14  Yes Orvil Feil, NP  esomeprazole (NEXIUM) 40 MG capsule Take 40 mg by mouth at bedtime.     Historical Provider, MD  indomethacin (INDOCIN) 25 MG capsule Take 1 capsule (25 mg total) by mouth 3 (three) times daily with meals. 07/31/14   Carole Civil, MD  Methylfol-Methylcob-Acetylcyst (CEREFOLIN NAC PO) Take 1 tablet by mouth daily.    Historical Provider, MD  Methylfol-Methylcob-Acetylcyst (CEREFOLIN NAC) 6-2-600 MG TABS Take by mouth.    Historical Provider, MD  polyethylene glycol (MIRALAX / GLYCOLAX) packet Take 17 g by mouth 2 (two) times daily. 03/30/14   [name], MD  predniSONE (STERAPRED UNI-PAK) 5 MG TABS tablet As directed 06/28/14   Carole Civil, MD   BP 132/69 mmHg  Pulse 64  Temp(Src) 98.1 F (36.7 C)  Resp 17  Ht _0  (1.626 m)  Wt 153 lb (69.4 kg)  BMI 26.25 kg/m2  SpO2 97%  Physical Exam  Constitutional: [name] is oriented to person, place, and time. [name] appears well-developed.  HENT:  Head:  Normocephalic.  Eyes: Conjunctivae and EOM are normal. No scleral icterus.  Neck: Neck supple. No thyromegaly present.  Cardiovascular: Normal rate and regular rhythm.  Exam reveals no gallop and no friction rub.   No murmur heard. Pulmonary/Chest: No stridor. [name] has no wheezes. [name] has no rales. [name] exhibits no tenderness.  Abdominal: [name] exhibits no distension. There is no tenderness. There is no rebound.  Musculoskeletal: Normal range of motion. [name] exhibits no edema.  Lymphadenopathy:    [name] has no cervical adenopathy.  Neurological: [name] is oriented to person, place, and time. [name] exhibits normal muscle tone. Coordination normal.  Skin: No rash noted. No erythema.  Psychiatric: [name] has a normal mood and affect. Her behavior is normal.    ED Course  Procedures (including critical care time)  DIAGNOSTIC STUDIES: Oxygen Saturation is 97% on RA, adequate by my interpretation.    COORDINATION OF CARE: 11:55 PM- Pt advised of plan for treatment and pt agrees.  Labs Review Labs Reviewed - No data to display  Imaging Review No results found.   EKG Interpretation None      MDM   Final diagnoses:  None    Colitis,  admit  I personally performed the services described in this documentation, which was scribed in my presence. The recorded information has been reviewed and is accurate.    Maudry Diego, MD 10/25/14 [phone]

## 2014-10-25 ENCOUNTER — Encounter (HOSPITAL_COMMUNITY): Payer: Self-pay | Admitting: Gastroenterology

## 2014-10-25 ENCOUNTER — Emergency Department (HOSPITAL_COMMUNITY): Payer: Medicare Other

## 2014-10-25 DIAGNOSIS — R112 Nausea with vomiting, unspecified: Secondary | ICD-10-CM

## 2014-10-25 DIAGNOSIS — Z808 Family history of malignant neoplasm of other organs or systems: Secondary | ICD-10-CM | POA: Diagnosis not present

## 2014-10-25 DIAGNOSIS — E1142 Type 2 diabetes mellitus with diabetic polyneuropathy: Secondary | ICD-10-CM | POA: Diagnosis present

## 2014-10-25 DIAGNOSIS — K625 Hemorrhage of anus and rectum: Secondary | ICD-10-CM | POA: Diagnosis not present

## 2014-10-25 DIAGNOSIS — I5032 Chronic diastolic (congestive) heart failure: Secondary | ICD-10-CM

## 2014-10-25 DIAGNOSIS — K529 Noninfective gastroenteritis and colitis, unspecified: Secondary | ICD-10-CM

## 2014-10-25 DIAGNOSIS — Z853 Personal history of malignant neoplasm of breast: Secondary | ICD-10-CM | POA: Diagnosis not present

## 2014-10-25 DIAGNOSIS — Z79899 Other long term (current) drug therapy: Secondary | ICD-10-CM | POA: Diagnosis not present

## 2014-10-25 DIAGNOSIS — I10 Essential (primary) hypertension: Secondary | ICD-10-CM

## 2014-10-25 DIAGNOSIS — A09 Infectious gastroenteritis and colitis, unspecified: Secondary | ICD-10-CM | POA: Insufficient documentation

## 2014-10-25 DIAGNOSIS — N179 Acute kidney failure, unspecified: Secondary | ICD-10-CM | POA: Diagnosis not present

## 2014-10-25 DIAGNOSIS — E119 Type 2 diabetes mellitus without complications: Secondary | ICD-10-CM | POA: Diagnosis not present

## 2014-10-25 DIAGNOSIS — Z8249 Family history of ischemic heart disease and other diseases of the circulatory system: Secondary | ICD-10-CM | POA: Diagnosis not present

## 2014-10-25 DIAGNOSIS — K6389 Other specified diseases of intestine: Secondary | ICD-10-CM | POA: Diagnosis not present

## 2014-10-25 DIAGNOSIS — D72829 Elevated white blood cell count, unspecified: Secondary | ICD-10-CM | POA: Diagnosis present

## 2014-10-25 DIAGNOSIS — E785 Hyperlipidemia, unspecified: Secondary | ICD-10-CM | POA: Diagnosis present

## 2014-10-25 DIAGNOSIS — Z794 Long term (current) use of insulin: Secondary | ICD-10-CM | POA: Diagnosis not present

## 2014-10-25 DIAGNOSIS — Z96641 Presence of right artificial hip joint: Secondary | ICD-10-CM | POA: Diagnosis present

## 2014-10-25 DIAGNOSIS — R1110 Vomiting, unspecified: Secondary | ICD-10-CM | POA: Diagnosis not present

## 2014-10-25 DIAGNOSIS — K7460 Unspecified cirrhosis of liver: Secondary | ICD-10-CM | POA: Diagnosis present

## 2014-10-25 DIAGNOSIS — I2510 Atherosclerotic heart disease of native coronary artery without angina pectoris: Secondary | ICD-10-CM | POA: Diagnosis present

## 2014-10-25 DIAGNOSIS — Z9010 Acquired absence of unspecified breast and nipple: Secondary | ICD-10-CM | POA: Diagnosis present

## 2014-10-25 DIAGNOSIS — Z7902 Long term (current) use of antithrombotics/antiplatelets: Secondary | ICD-10-CM | POA: Diagnosis not present

## 2014-10-25 DIAGNOSIS — Z90710 Acquired absence of both cervix and uterus: Secondary | ICD-10-CM | POA: Diagnosis not present

## 2014-10-25 DIAGNOSIS — I252 Old myocardial infarction: Secondary | ICD-10-CM | POA: Diagnosis not present

## 2014-10-25 DIAGNOSIS — Z87891 Personal history of nicotine dependence: Secondary | ICD-10-CM | POA: Diagnosis not present

## 2014-10-25 DIAGNOSIS — Z801 Family history of malignant neoplasm of trachea, bronchus and lung: Secondary | ICD-10-CM | POA: Diagnosis not present

## 2014-10-25 DIAGNOSIS — Z7952 Long term (current) use of systemic steroids: Secondary | ICD-10-CM | POA: Diagnosis not present

## 2014-10-25 DIAGNOSIS — Z791 Long term (current) use of non-steroidal anti-inflammatories (NSAID): Secondary | ICD-10-CM | POA: Diagnosis not present

## 2014-10-25 LAB — COMPREHENSIVE METABOLIC PANEL
ALT: 11 U/L (ref 0–35)
ALT: 11 U/L (ref 0–35)
AST: 21 U/L (ref 0–37)
AST: 22 U/L (ref 0–37)
Albumin: 3.3 g/dL — ABNORMAL LOW (ref 3.5–5.2)
Albumin: 3.4 g/dL — ABNORMAL LOW (ref 3.5–5.2)
Alkaline Phosphatase: 264 U/L — ABNORMAL HIGH (ref 39–117)
Alkaline Phosphatase: 269 U/L — ABNORMAL HIGH (ref 39–117)
Anion gap: 14 (ref 5–15)
Anion gap: 15 (ref 5–15)
BUN: 21 mg/dL (ref 6–23)
BUN: 26 mg/dL — ABNORMAL HIGH (ref 6–23)
CO2: 24 mEq/L (ref 19–32)
CO2: 24 mEq/L (ref 19–32)
Calcium: 9.3 mg/dL (ref 8.4–10.5)
Calcium: 9.4 mg/dL (ref 8.4–10.5)
Chloride: 102 mEq/L (ref 96–112)
Chloride: 104 mEq/L (ref 96–112)
Creatinine, Ser: 0.97 mg/dL (ref 0.50–1.10)
Creatinine, Ser: 1.24 mg/dL — ABNORMAL HIGH (ref 0.50–1.10)
GFR calc Af Amer: 46 mL/min — ABNORMAL LOW (ref >90)
GFR calc Af Amer: 62 mL/min — ABNORMAL LOW (ref >90)
GFR calc non Af Amer: 40 mL/min — ABNORMAL LOW (ref >90)
GFR calc non Af Amer: 53 mL/min — ABNORMAL LOW (ref >90)
Glucose, Bld: 145 mg/dL — ABNORMAL HIGH (ref 70–99)
Glucose, Bld: 172 mg/dL — ABNORMAL HIGH (ref 70–99)
Potassium: 3.4 mEq/L — ABNORMAL LOW (ref 3.7–5.3)
Potassium: 3.8 mEq/L (ref 3.7–5.3)
Sodium: 140 mEq/L (ref 137–147)
Sodium: 143 mEq/L (ref 137–147)
Total Bilirubin: 0.4 mg/dL (ref 0.3–1.2)
Total Bilirubin: 0.7 mg/dL (ref 0.3–1.2)
Total Protein: 7.4 g/dL (ref 6.0–8.3)
Total Protein: 7.6 g/dL (ref 6.0–8.3)

## 2014-10-25 LAB — CBC WITH DIFFERENTIAL/PLATELET
Basophils Absolute: 0.1 10*3/uL (ref 0.0–0.1)
Basophils Relative: 1 % (ref 0–1)
Eosinophils Absolute: 0.1 10*3/uL (ref 0.0–0.7)
Eosinophils Relative: 1 % (ref 0–5)
HCT: 35.7 % — ABNORMAL LOW (ref 36.0–46.0)
Hemoglobin: 11.9 g/dL — ABNORMAL LOW (ref 12.0–15.0)
Lymphocytes Relative: 11 % — ABNORMAL LOW (ref 12–46)
Lymphs Abs: 1.2 10*3/uL (ref 0.7–4.0)
MCH: 31.5 pg (ref 26.0–34.0)
MCHC: 33.3 g/dL (ref 30.0–36.0)
MCV: 94.4 fL (ref 78.0–100.0)
Monocytes Absolute: 0.6 10*3/uL (ref 0.1–1.0)
Monocytes Relative: 6 % (ref 3–12)
Neutro Abs: 8.9 10*3/uL — ABNORMAL HIGH (ref 1.7–7.7)
Neutrophils Relative %: 81 % — ABNORMAL HIGH (ref 43–77)
Platelets: 224 10*3/uL (ref 150–400)
RBC: 3.78 MIL/uL — ABNORMAL LOW (ref 3.87–5.11)
RDW: 15.2 % (ref 11.5–15.5)
WBC: 11 10*3/uL — ABNORMAL HIGH (ref 4.0–10.5)

## 2014-10-25 LAB — CBC
HCT: 37.4 % (ref 36.0–46.0)
HCT: 34.1 % — ABNORMAL LOW (ref 36.0–46.0)
HCT: 33.1 % — ABNORMAL LOW (ref 36.0–46.0)
Hemoglobin: 12.3 g/dL (ref 12.0–15.0)
Hemoglobin: 11.7 g/dL — ABNORMAL LOW (ref 12.0–15.0)
Hemoglobin: 11.2 g/dL — ABNORMAL LOW (ref 12.0–15.0)
MCH: 30.8 pg (ref 26.0–34.0)
MCH: 31.9 pg (ref 26.0–34.0)
MCH: 31.7 pg (ref 26.0–34.0)
MCHC: 32.9 g/dL (ref 30.0–36.0)
MCHC: 34.3 g/dL (ref 30.0–36.0)
MCHC: 33.8 g/dL (ref 30.0–36.0)
MCV: 93.5 fL (ref 78.0–100.0)
MCV: 92.9 fL (ref 78.0–100.0)
MCV: 93.8 fL (ref 78.0–100.0)
Platelets: 229 10*3/uL (ref 150–400)
Platelets: 223 10*3/uL (ref 150–400)
Platelets: 232 10*3/uL (ref 150–400)
RBC: 4 MIL/uL (ref 3.87–5.11)
RBC: 3.67 MIL/uL — ABNORMAL LOW (ref 3.87–5.11)
RBC: 3.53 MIL/uL — ABNORMAL LOW (ref 3.87–5.11)
RDW: 15.1 % (ref 11.5–15.5)
RDW: 15.1 % (ref 11.5–15.5)
RDW: 15.2 % (ref 11.5–15.5)
WBC: 9.6 10*3/uL (ref 4.0–10.5)
WBC: 12.7 10*3/uL — ABNORMAL HIGH (ref 4.0–10.5)
WBC: 13.6 10*3/uL — ABNORMAL HIGH (ref 4.0–10.5)

## 2014-10-25 LAB — CLOSTRIDIUM DIFFICILE BY PCR: Toxigenic C. Difficile by PCR: NEGATIVE

## 2014-10-25 MED ORDER — ALUM & MAG HYDROXIDE-SIMETH 200-200-20 MG/5ML PO SUSP: 30.0000 mL | Freq: Four times a day (QID) | ORAL | Status: DC | PRN

## 2014-10-25 MED ORDER — METRONIDAZOLE IN NACL 5-0.79 MG/ML-% IV SOLN
500.0000 mg | Freq: Three times a day (TID) | INTRAVENOUS | Status: DC
  Administered 2014-10-25 – 2014-10-27 (×8): 500 mg via INTRAVENOUS
  Filled 2014-10-25 (×8): qty 100

## 2014-10-25 MED ORDER — CIPROFLOXACIN IN D5W 200 MG/100ML IV SOLN
200.0000 mg | Freq: Two times a day (BID) | INTRAVENOUS | Status: DC
  Administered 2014-10-25 – 2014-10-27 (×5): 200 mg via INTRAVENOUS
  Filled 2014-10-25 (×8): qty 100

## 2014-10-25 MED ORDER — METRONIDAZOLE IN NACL 5-0.79 MG/ML-% IV SOLN
500.0000 mg | Freq: Once | INTRAVENOUS | Status: DC
  Filled 2014-10-25: qty 100

## 2014-10-25 MED ORDER — POTASSIUM CHLORIDE CRYS ER 20 MEQ PO TBCR
40.0000 meq | EXTENDED_RELEASE_TABLET | Freq: Once | ORAL | Status: AC
  Administered 2014-10-25: 40 meq via ORAL
  Filled 2014-10-25: qty 2

## 2014-10-25 MED ORDER — MORPHINE SULFATE 2 MG/ML IJ SOLN
1.0000 mg | INTRAMUSCULAR | Status: DC | PRN
Start: 2014-10-25 — End: 2014-10-28
  Administered 2014-10-25: 1 mg via INTRAVENOUS
  Filled 2014-10-25: qty 1

## 2014-10-25 MED ORDER — ONDANSETRON HCL 4 MG PO TABS: 4.0000 mg | ORAL_TABLET | Freq: Four times a day (QID) | ORAL | Status: DC | PRN

## 2014-10-25 MED ORDER — SODIUM CHLORIDE 0.9 % IV SOLN: INTRAVENOUS | Status: DC

## 2014-10-25 MED ORDER — IOHEXOL 300 MG/ML  SOLN
25.0000 mL | Freq: Once | INTRAMUSCULAR | Status: AC | PRN
  Administered 2014-10-25: 25 mL via ORAL

## 2014-10-25 MED ORDER — TIOTROPIUM BROMIDE MONOHYDRATE 18 MCG IN CAPS
18.0000 ug | ORAL_CAPSULE | Freq: Every day | RESPIRATORY_TRACT | Status: DC | PRN
  Filled 2014-10-25: qty 5

## 2014-10-25 MED ORDER — CIPROFLOXACIN IN D5W 400 MG/200ML IV SOLN
400.0000 mg | Freq: Once | INTRAVENOUS | Status: AC
  Administered 2014-10-25: 400 mg via INTRAVENOUS
  Filled 2014-10-25: qty 200

## 2014-10-25 MED ORDER — ONDANSETRON HCL 4 MG/2ML IJ SOLN: 4.0000 mg | Freq: Four times a day (QID) | INTRAMUSCULAR | Status: DC | PRN

## 2014-10-25 MED ORDER — IOHEXOL 300 MG/ML  SOLN
100.0000 mL | Freq: Once | INTRAMUSCULAR | Status: AC | PRN
  Administered 2014-10-25: 100 mL via INTRAVENOUS

## 2014-10-25 NOTE — Consult Note (Signed)
Referring Provider: Samuella Cota, MD Primary Care Physician:  [name], MD Primary Gastroenterologist:  Barney Drain, MD  Reason for Consultation:  Bloody stools  HPI: [name] is a 80 y.o. female admitted with acute onset N/V/D yesterday after eating a cheeseburger from a [employer]. Symptoms began about 3 hours after she ate. First episode of diarrhea, there was brbpr. Passing mostly blood per rectum. Two episodes since admitted to the floor overnight. Denies melena. Has been having problems with constipation lately, takes stool softener twice daily and MOM prn only. Denies prior abdominal pain. Currently has lower abdominal cramping. No hematemesis. Heartburn has been well controlled. She is on Plavix and also has multiple NSAIDs on her medication list.   Last TCS in 2010, single tubular adenoma.  CT A/P with contrast: prominence of lateral segment left lobe of liver and nodular contour suggesting hepatic cirrhosis. Descending colon with diffuse wall thickening with pericolonic infiltration ?nonspecific colitis such as infectious or inflammatory. Ischemic ?less likely per radiologist. WBC initially 11000. Stool studies are pending.      Prior to Admission medications   Medication Sig Start Date End Date Taking? Authorizing Provider  ALPRAZolam Duanne Moron) 0.25 MG tablet Take one tablet by mouth twice daily as needed for anxiety 04/23/14  Yes Tiffany L Reed, DO  atorvastatin (LIPITOR) 10 MG tablet Take 1 tablet (10 mg total) by mouth daily. 02/20/14  Yes [name], MD  cholecalciferol (VITAMIN D) 1000 UNITS tablet Take 1,000 Units by mouth every morning.    Yes Historical Provider, MD  citalopram (CELEXA) 10 MG tablet Take 1 tablet (10 mg total) by mouth daily. 04/20/14  Yes Kathie Dike, MD  clopidogrel (PLAVIX) 75 MG tablet Take 1 tablet (75 mg total) by mouth daily with breakfast. 02/20/14  Yes [name], MD  docusate sodium 100 MG CAPS Take 100 mg by mouth 2 (two)  times daily. 03/30/14  Yes [name], MD  feeding supplement, GLUCERNA SHAKE, (GLUCERNA SHAKE) LIQD Take 237 mLs by mouth 3 (three) times daily between meals. 03/30/14  Yes [name], MD  furosemide (LASIX) 40 MG tablet Take 1 tablet (40 mg total) by mouth daily. 03/30/14  Yes [name], MD  HYDROcodone-acetaminophen (NORCO/VICODIN) 5-325 MG per tablet Take one tablet by mouth every 4 hours as needed for pain 09/11/14  Yes Carole Civil, MD  insulin aspart (NOVOLOG) 100 UNIT/ML injection Inject into the skin 3 (three) times daily before meals.   Yes Historical Provider, MD  insulin glargine (LANTUS) 100 UNIT/ML injection Inject 17 Units into the skin at bedtime.    Yes Historical Provider, MD  lisinopril (PRINIVIL,ZESTRIL) 20 MG tablet Take 20 mg by mouth daily.   Yes Historical Provider, MD  Methylfol-Algae-B12-Acetylcyst (CEREFOLIN NAC) 6-90.314-2-600 MG TABS Take by mouth.   Yes Historical Provider, MD  metoprolol (LOPRESSOR) 50 MG tablet Take 1 tablet (50 mg total) by mouth 2 (two) times daily. 04/20/14  Yes Kathie Dike, MD  mirtazapine (REMERON) 30 MG tablet Take 30 mg by mouth at bedtime.   Yes Historical Provider, MD  naproxen (NAPROSYN) 500 MG tablet Take 1 tablet (500 mg total) by mouth 2 (two) times daily with a meal. 08/27/14  Yes Carole Civil, MD  pantoprazole (PROTONIX) 40 MG tablet Take 1 tablet (40 mg total) by mouth daily. 08/31/14  Yes Orvil Feil, NP  potassium chloride (KLOR-CON) 20 MEQ packet Take by mouth 2 (two) times daily.   Yes Historical Provider, MD  tiotropium (SPIRIVA) 18 MCG inhalation capsule Place 18 mcg into inhaler and inhale daily as needed (shortness of breath).   Yes Historical Provider, MD  ursodiol (URSO FORTE) 500 MG tablet Take 1 tablet (500 mg total) by mouth 2 (two) times daily with a meal. 08/30/14  Yes Orvil Feil, NP        Historical Provider, MD  indomethacin (INDOCIN) 25 MG capsule Take 1 capsule (25 mg total) by mouth  3 (three) times daily with meals. 07/31/14   Carole Civil, MD  Methylfol-Methylcob-Acetylcyst (CEREFOLIN NAC PO) Take 1 tablet by mouth daily.    Historical Provider, MD  Methylfol-Methylcob-Acetylcyst (CEREFOLIN NAC) 6-2-600 MG TABS Take by mouth.    Historical Provider, MD          predniSONE (STERAPRED UNI-PAK) 5 MG TABS tablet As directed 06/28/14   Carole Civil, MD    Current Facility-Administered Medications  Medication Dose Route Frequency Provider Last Rate Last Dose  . 0.9 %  sodium chloride infusion   Intravenous Continuous Phillips Grout, MD      . alum & mag hydroxide-simeth (MAALOX/MYLANTA) 200-200-20 MG/5ML suspension 30 mL  30 mL Oral Q6H PRN Phillips Grout, MD      . ciprofloxacin (CIPRO) IVPB 200 mg  200 mg Intravenous Q12H Phillips Grout, MD      . metroNIDAZOLE (FLAGYL) IVPB 500 mg  500 mg Intravenous Q8H Phillips Grout, MD   500 mg at 10/25/14 0450  . ondansetron (ZOFRAN) tablet 4 mg  4 mg Oral Q6H PRN Phillips Grout, MD       Or  . ondansetron [facility]) injection 4 mg  4 mg Intravenous Q6H PRN Phillips Grout, MD      . tiotropium [facility]) inhalation capsule 18 mcg  18 mcg Inhalation Daily PRN Phillips Grout, MD        Allergies as of 10/24/2014 - Review Complete 10/24/2014  Allergen Reaction Noted  . Aricept [donepezil hcl]  02/16/2014  . Namenda [memantine hcl]  02/16/2014  . Codeine  02/16/2014  . Latex  02/16/2014  . Indocin [indomethacin] Rash 09/11/2014  . Penicillins Rash 01/11/2007    Past Medical History  Diagnosis Date  . History of GI bleed   . Parotid tumor   . History of breast cancer   . Type 2 diabetes mellitus   . Osteopenia   . Essential hypertension, benign   . Hyperlipidemia   . Depression   . Anxiety   . Chronic headaches   . Carotid artery disease     Bilateral ICA occlusion  . Orthostatic hypotension     Diabetic polyneuropathy/diabetic dysautonomia   . Coronary atherosclerosis of native coronary artery     Mild  nonobstructive disease at cardiac catheterization May 2011  . PSVT (paroxysmal supraventricular tachycardia)   . Acute diastolic congestive heart failure 03/21/2014  . Fracture of femoral neck, right, closed 03/28/2014  . Acute delirium 06/23/2013  . Syncope and collapse 04/15/2010    Qualifier: Diagnosis of  By: Angelena Form, MD, Harrell Gave    . Pressure ulcer, heel, right, unstageable 04/17/2014  . UPPER GASTROINTESTINAL HEMORRHAGE 03/21/2007    Qualifier: Diagnosis of  By: Jonna Munro MD, Roderic Scarce    . Chronic liver disease     ?cirrhosis on 10/2014 CT. H/O elevated alkphos and +AMA on URSO.    Past Surgical History  Procedure Laterality Date  . Mastectomy  1994    Left breast -related to breast cancer  .  Vesicovaginal fistula closure w/ tah  1979  . Cholecystectomy  2008  . Hemorrhoid surgery  1960s  . Laparoscopic appendectomy N/A 04/13/2013    Procedure: APPENDECTOMY LAPAROSCOPIC;  Surgeon: Donato Heinz, MD;  Location: [facility];  Service: General;  Laterality: N/A;  . Colonoscopy  2010    Dr. Oneida Alar: single tubular adenoma, one hyperplastic polyp. Next TCS 2020 health permitting.  . Hip arthroplasty Right 03/28/2014    Procedure: ARTHROPLASTY BIPOLAR HIP;  Surgeon: Carole Civil, MD;  Location: [facility];  Service: Orthopedics;  Laterality: Right;    Family History  Problem Relation Age of Onset  . Heart failure Mother   . Cirrhosis Brother   . Lung cancer Father     Brain METS  . Kidney cancer Mother   . Kidney cancer Sister     History   Social History  . Marital Status: Divorced    Spouse Name: N/A    Number of Children: N/A  . Years of Education: N/A   Occupational History  . RETIRED FACTORY WORKER     [employer]   Social History Main Topics  . Smoking status: Former Smoker -- 0.50 packs/day for 40 years    Types: Cigarettes    Quit date: 03/30/2013  . Smokeless tobacco: Not on file  . Alcohol Use: No  . Drug Use: No  . Sexual Activity: No   Other Topics Concern   . 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 Started age 22.   Drug use -no   Alcohol use - no   Lives with son   Education - 12 th grade                    ROS:  General: Negative for anorexia, weight loss, fever, chills, fatigue, weakness. Eyes: Negative for vision changes.  ENT: Negative for hoarseness, difficulty swallowing , nasal congestion. CV: Negative for chest pain, angina, palpitations, dyspnea on exertion, peripheral edema.  Respiratory: Negative for dyspnea at rest, dyspnea on exertion, cough, sputum, wheezing.  GI: See history of present illness. GU:  Negative for dysuria, hematuria, urinary incontinence, urinary frequency, nocturnal urination.  MS: Negative for joint pain, low back pain.  Derm: Negative for rash or itching.  Neuro: Negative for weakness, abnormal sensation, seizure, frequent headaches, memory loss, confusion.  Psych: Negative for anxiety, depression, suicidal ideation, hallucinations.  Endo: Negative for unusual weight change.  Heme: Negative for bruising or bleeding. Allergy: Negative for rash or hives.       Physical Examination: Vital signs in last 24 hours: Temp:  [98.1 F (36.7 C)-99.4 F (37.4 C)] 99.4 F (37.4 C) (12/10 0415) Pulse Rate:  [64-78] 76 (12/10 0415) Resp:  [17-48] 48 (12/10 0415) BP: (132-158)/(69-82) 148/77 mmHg (12/10 0415) SpO2:  [92 %-97 %] 93 % (12/10 0415) Weight:  [153 lb (69.4 kg)-156 lb 1.4 oz (70.8 kg)] 156 lb 1.4 oz (70.8 kg) (12/10 0415) Last BM Date: 10/25/14  General: Well-nourished, well-developed in no acute distress.  Head: Normocephalic, atraumatic.   Eyes: Conjunctiva pink, no icterus. Mouth: Oropharyngeal mucosa moist and pink , no lesions erythema or exudate. Neck: Supple without thyromegaly,  masses, or lymphadenopathy.  Lungs: Clear to auscultation bilaterally.  Heart: Regular rate and rhythm, no  murmurs rubs or gallops.  Abdomen: Bowel sounds are normal, mild lower abd tenderness, nondistended, no hepatosplenomegaly or masses, no abdominal bruits or    hernia , no rebound or guarding.   Rectal: not performed Extremities: No lower extremity edema, clubbing, deformity.  Neuro: Alert and oriented x 4 , grossly normal neurologically.  Skin: Warm and dry, no rash or jaundice.   Psych: Alert and cooperative, normal mood and affect.        Intake/Output from previous day:   Intake/Output this shift:    Lab Results: CBC  Recent Labs  10/24/14 2356 10/25/14 0621  WBC 11.0* 9.6  HGB 11.9* 12.3  HCT 35.7* 37.4  MCV 94.4 93.5  PLT 224 229   BMET  Recent Labs  10/24/14 2356 10/25/14 0621  NA 140 143  K 3.8 3.4*  CL 102 104  CO2 24 24  GLUCOSE 145* 172*  BUN 26* 21  CREATININE 1.24* 0.97  CALCIUM 9.4 9.3   LFT  Recent Labs  10/24/14 2356 10/25/14 0621  BILITOT 0.4 0.7  ALKPHOS 264* 269*  AST 22 21  ALT 11 11  PROT 7.4 7.6  ALBUMIN 3.3* 3.4*    Lipase No results for input(s): LIPASE in the last 72 hours.  PT/INR No results for input(s): LABPROT, INR in the last 72 hours.    Imaging Studies: Ct Abdomen Pelvis W Contrast  10/25/2014   CLINICAL DATA:  Bright red blood per rectum with nausea, vomiting, and diarrhea. Patient is on Plavix. History of diabetes, breast cancer with mastectomy, hypertension.  EXAM: CT ABDOMEN AND PELVIS WITH CONTRAST  TECHNIQUE: Multidetector CT imaging of the abdomen and pelvis was performed using the standard protocol following bolus administration of intravenous contrast.  CONTRAST:  36m OMNIPAQUE IOHEXOL 300 MG/ML SOLN, 1026mOMNIPAQUE IOHEXOL 300 MG/ML SOLN  COMPARISON:  05/16/2014  FINDINGS: Interstitial changes in the lung bases probably representing fibrosis.  Prominence of the lateral segment left lobe of liver and  nodular contour suggesting hepatic cirrhosis. Surgical absence of the gallbladder. Pancreas is atrophic. Spleen, adrenal glands, inferior vena cava, and retroperitoneal lymph nodes are unremarkable. Calcification of aorta without aneurysm. Stomach and small bowel are unremarkable. No small bowel distention. Descending colon demonstrates diffuse wall thickening with pericolonic infiltration. This suggests nonspecific colitis. Infectious or inflammatory causes most likely. No free air or free fluid in the abdomen. Subcentimeter cysts in the kidneys. No solid mass or hydronephrosis.  Pelvis: Postoperative changes in the right hip. Streak artifact from hip prosthesis limits evaluation of lower pelvis. Uterus appears surgically absent. Bladder wall is not thickened. No free or loculated pelvic fluid collections. No pelvic mass or lymphadenopathy. Surgical absence of the appendix. Degenerative changes in the lumbar spine. No destructive bone lesions.  IMPRESSION: Wall thickening and pericolonic infiltration along the descending colon suggesting nonspecific focal colitis. This could represent infectious or inflammatory etiologies. Ischemic colitis felt less likely. Probable hepatic cirrhosis.   Electronically Signed   By: WiLucienne Capers.D.   On: 10/25/2014 02:47     Impression: [age]/o female with h/o chronically elevated AlkPhos, +AMA on URSO, who presents with acute onset N/V/bloody diarrhea which occurred a few hours after eating a cheeseburger. CT findings with focal colitis in descending colon and ?cirrhosis (new finding). Ddx includes infectious etiology, ischemic colitis. Currently patient is hemodynamically stable. Hgb remains normal and WBC improved. Does not appear toxic. Creatinine is improved as well.   Plan: 1. Await stool studies. Add E.coli [id number]. Monitor  closely the next 24 hours. If bleeding does not improve, she may require inpatient colonoscopy. 3. Follow up liver findings as outpatient.    We would like to thank you for the opportunity to participate in the care of Keeara S Bossler.    LOS: 1 day   Neil Crouch  10/25/2014, 8:06 AM  Attending note;  Pt seen and examined; may well have ischemic or segmental colitis given clinical presentation and CT findings.  She is not toxic appearing at all.  Liver issues will need adressing further as an outpatient

## 2014-10-25 NOTE — H&P (Signed)
PCP:   [name], MD   Chief Complaint:  N/v/d  HPI: 80 yo female with acute onset of n/v/d today after eating something that she says was bad at a local restaruant.  Several episodes of nonbloody vomit and several episodes of bloody diarrhea with associated lower abd cramping.  No dysuria.  Pain is not bad.  She did have some abx about a week after having all of her teeth removed, does not remember the name of the abx and none other than that.  She is very functional at home and lives with her son who is present in room.  mentating normally.  We are asked to admit pt for intractable nausea and vomiting.  Review of Systems:  Positive and negative as per HPI otherwise all other systems are negative  Past Medical History: Past Medical History  Diagnosis Date  . History of GI bleed   . Parotid tumor   . History of breast cancer   . Type 2 diabetes mellitus   . Osteopenia   . Essential hypertension, benign   . Hyperlipidemia   . Depression   . Anxiety   . Chronic headaches   . Carotid artery disease     Bilateral ICA occlusion  . Orthostatic hypotension     Diabetic polyneuropathy/diabetic dysautonomia   . Coronary atherosclerosis of native coronary artery     Mild nonobstructive disease at cardiac catheterization May 2011  . PSVT (paroxysmal supraventricular tachycardia)   . Acute diastolic congestive heart failure 03/21/2014  . Fracture of femoral neck, right, closed 03/28/2014  . Acute delirium 06/23/2013  . Syncope and collapse 04/15/2010    Qualifier: Diagnosis of  By: Angelena Form, MD, Harrell Gave    . Pressure ulcer, heel, right, unstageable 04/17/2014  . UPPER GASTROINTESTINAL HEMORRHAGE 03/21/2007    Qualifier: Diagnosis of  By: Jonna Munro MD, Roderic Scarce     Past Surgical History  Procedure Laterality Date  . Mastectomy  1994    Left breast -related to breast cancer  . Vesicovaginal fistula closure w/ tah  1979  . Cholecystectomy  2008  . Hemorrhoid surgery  1960s  .  Laparoscopic appendectomy N/A 04/13/2013    Procedure: APPENDECTOMY LAPAROSCOPIC;  Surgeon: Donato Heinz, MD;  Location: [facility];  Service: General;  Laterality: N/A;  . Colonoscopy  2010    Dr. Oneida Alar: single tubular adenoma, one hyperplastic polyp. Next TCS 2020 health permitting.  . Hip arthroplasty Right 03/28/2014    Procedure: ARTHROPLASTY BIPOLAR HIP;  Surgeon: Carole Civil, MD;  Location: [facility];  Service: Orthopedics;  Laterality: Right;    Medications: Prior to Admission medications   Medication Sig Start Date End Date Taking? Authorizing Provider  ALPRAZolam Duanne Moron) 0.25 MG tablet Take one tablet by mouth twice daily as needed for anxiety 04/23/14  Yes Tiffany L Reed, DO  atorvastatin (LIPITOR) 10 MG tablet Take 1 tablet (10 mg total) by mouth daily. 02/20/14  Yes [name], MD  cholecalciferol (VITAMIN D) 1000 UNITS tablet Take 1,000 Units by mouth every morning.    Yes Historical Provider, MD  citalopram (CELEXA) 10 MG tablet Take 1 tablet (10 mg total) by mouth daily. 04/20/14  Yes Kathie Dike, MD  clopidogrel (PLAVIX) 75 MG tablet Take 1 tablet (75 mg total) by mouth daily with breakfast. 02/20/14  Yes [name], MD  docusate sodium 100 MG CAPS Take 100 mg by mouth 2 (two) times daily. 03/30/14  Yes [name], MD  feeding supplement, [place], ([place])  LIQD Take 237 mLs by mouth 3 (three) times daily between meals. 03/30/14  Yes [name], MD  furosemide (LASIX) 40 MG tablet Take 1 tablet (40 mg total) by mouth daily. 03/30/14  Yes [name], MD  HYDROcodone-acetaminophen (NORCO/VICODIN) 5-325 MG per tablet Take one tablet by mouth every 4 hours as needed for pain 09/11/14  Yes Carole Civil, MD  insulin aspart (NOVOLOG) 100 UNIT/ML injection Inject into the skin 3 (three) times daily before meals.   Yes Historical Provider, MD  insulin glargine (LANTUS) 100 UNIT/ML injection Inject 17 Units into the skin at bedtime.    Yes  Historical Provider, MD  lisinopril (PRINIVIL,ZESTRIL) 20 MG tablet Take 20 mg by mouth daily.   Yes Historical Provider, MD  Methylfol-Algae-B12-Acetylcyst (CEREFOLIN NAC) 6-90.314-2-600 MG TABS Take by mouth.   Yes Historical Provider, MD  metoprolol (LOPRESSOR) 50 MG tablet Take 1 tablet (50 mg total) by mouth 2 (two) times daily. 04/20/14  Yes Kathie Dike, MD  mirtazapine (REMERON) 30 MG tablet Take 30 mg by mouth at bedtime.   Yes Historical Provider, MD  naproxen (NAPROSYN) 500 MG tablet Take 1 tablet (500 mg total) by mouth 2 (two) times daily with a meal. 08/27/14  Yes Carole Civil, MD  pantoprazole (PROTONIX) 40 MG tablet Take 1 tablet (40 mg total) by mouth daily. 08/31/14  Yes Orvil Feil, NP  potassium chloride (KLOR-CON) 20 MEQ packet Take by mouth 2 (two) times daily.   Yes Historical Provider, MD  tiotropium (SPIRIVA) 18 MCG inhalation capsule Place 18 mcg into inhaler and inhale daily as needed (shortness of breath).   Yes Historical Provider, MD  ursodiol (URSO FORTE) 500 MG tablet Take 1 tablet (500 mg total) by mouth 2 (two) times daily with a meal. 08/30/14  Yes Orvil Feil, NP  esomeprazole (NEXIUM) 40 MG capsule Take 40 mg by mouth at bedtime.     Historical Provider, MD  indomethacin (INDOCIN) 25 MG capsule Take 1 capsule (25 mg total) by mouth 3 (three) times daily with meals. 07/31/14   Carole Civil, MD  Methylfol-Methylcob-Acetylcyst (CEREFOLIN NAC PO) Take 1 tablet by mouth daily.    Historical Provider, MD  Methylfol-Methylcob-Acetylcyst (CEREFOLIN NAC) 6-2-600 MG TABS Take by mouth.    Historical Provider, MD  polyethylene glycol (MIRALAX / GLYCOLAX) packet Take 17 g by mouth 2 (two) times daily. 03/30/14   [name], MD  predniSONE (STERAPRED UNI-PAK) 5 MG TABS tablet As directed 06/28/14   Carole Civil, MD    Allergies:   Allergies  Allergen Reactions  . Aricept [Donepezil Hcl]     Hives, vomiting and headache  . Namenda [Memantine Hcl]      hives  . Codeine   . Latex   . Indocin [Indomethacin] Rash  . Penicillins Rash    Social History:  reports that she quit smoking about 18 months ago. Her smoking use included Cigarettes. She has a 20 pack-year smoking history. She does not have any smokeless tobacco history on file. She reports that she does not drink alcohol or use illicit drugs.  Family History: Family History  Problem Relation Age of Onset  . Heart failure Mother   . Cirrhosis Brother   . Lung cancer Father     Brain METS  . Kidney cancer Mother   . Kidney cancer Sister     Physical Exam: Filed Vitals:   10/24/14 2329 10/25/14 0049 10/25/14 0312 10/25/14 0314  BP: 132/69  158/82  Pulse: 64   78  Temp: 98.1 F (36.7 C) 98.8 F (37.1 C) 98.6 F (37 C)   TempSrc:  Oral Oral   Resp: 17     Height: _0  (1.626 m)     Weight: 69.4 kg (153 lb)     SpO2: 97%   92%   General appearance: alert, cooperative and no distress Head: Normocephalic, without obvious abnormality, atraumatic Eyes: negative Nose: Nares normal. Septum midline. Mucosa normal. No drainage or sinus tenderness. Neck: no JVD and supple, symmetrical, trachea midline Lungs: clear to auscultation bilaterally Heart: regular rate and rhythm, S1, S2 normal, no murmur, click, rub or gallop Abdomen: soft, non-tender; bowel sounds normal; no masses,  no organomegaly Extremities: extremities normal, atraumatic, no cyanosis or edema Pulses: 2+ and symmetric Skin: Skin color, texture, turgor normal. No rashes or lesions Neurologic: Grossly normal    Labs on Admission:   Recent Labs  10/24/14 2356  NA 140  K 3.8  CL 102  CO2 24  GLUCOSE 145*  BUN 26*  CREATININE 1.24*  CALCIUM 9.4    Recent Labs  10/24/14 2356  AST 22  ALT 11  ALKPHOS 264*  BILITOT 0.4  PROT 7.4  ALBUMIN 3.3*    Recent Labs  10/24/14 2356  WBC 11.0*  NEUTROABS 8.9*  HGB 11.9*  HCT 35.7*  MCV 94.4  PLT 224   Radiological Exams on Admission: Ct  Abdomen Pelvis W Contrast  10/25/2014   CLINICAL DATA:  Bright red blood per rectum with nausea, vomiting, and diarrhea. Patient is on Plavix. History of diabetes, breast cancer with mastectomy, hypertension.  EXAM: CT ABDOMEN AND PELVIS WITH CONTRAST  TECHNIQUE: Multidetector CT imaging of the abdomen and pelvis was performed using the standard protocol following bolus administration of intravenous contrast.  CONTRAST:  4m OMNIPAQUE IOHEXOL 300 MG/ML SOLN, 1047mOMNIPAQUE IOHEXOL 300 MG/ML SOLN  COMPARISON:  05/16/2014  FINDINGS: Interstitial changes in the lung bases probably representing fibrosis.  Prominence of the lateral segment left lobe of liver and nodular contour suggesting hepatic cirrhosis. Surgical absence of the gallbladder. Pancreas is atrophic. Spleen, adrenal glands, inferior vena cava, and retroperitoneal lymph nodes are unremarkable. Calcification of aorta without aneurysm. Stomach and small bowel are unremarkable. No small bowel distention. Descending colon demonstrates diffuse wall thickening with pericolonic infiltration. This suggests nonspecific colitis. Infectious or inflammatory causes most likely. No free air or free fluid in the abdomen. Subcentimeter cysts in the kidneys. No solid mass or hydronephrosis.  Pelvis: Postoperative changes in the right hip. Streak artifact from hip prosthesis limits evaluation of lower pelvis. Uterus appears surgically absent. Bladder wall is not thickened. No free or loculated pelvic fluid collections. No pelvic mass or lymphadenopathy. Surgical absence of the appendix. Degenerative changes in the lumbar spine. No destructive bone lesions.  IMPRESSION: Wall thickening and pericolonic infiltration along the descending colon suggesting nonspecific focal colitis. This could represent infectious or inflammatory etiologies. Ischemic colitis felt less likely. Probable hepatic cirrhosis.   Electronically Signed   By: WiLucienne Capers.D.   On: 10/25/2014 02:47    Assessment/Plan  [age]o female with acute colitis  Principal Problem:   Colitis-  Iv cipro/flagyl.  Ck stool cx and cdiff.  dirrhea is bloody, will repeat h/h in am.  Gentle ivf overnight.  Full liq diet.  abd exam benign.  Active Problems:  Stable unless o/w noted   Essential hypertension   CORONARY ATHEROSCLEROSIS NATIVE CORONARY ARTERY   Nausea & vomiting-  Feeling better, is wanting to eat.   AKI (acute kidney injury)-  Ivf, repeat cr in am.   Diarrhea of infectious origin   Diastolic CHF, chronic  obs on med.  Full code.  Pt requesting to go home already.  Possible d/c later today or tomorrow depending on if she tolerates eating and drinking.  Emmanuelle Hibbitts A 10/25/2014, 3:15 AM

## 2014-10-25 NOTE — Progress Notes (Signed)
Bloody stools continue - 2 since being on floor.  Specimen sent to lab.  Pt is now resting in bed with eyes closed.

## 2014-10-25 NOTE — Progress Notes (Signed)
Pt had BRBPR when attempting to pass stool. Approximately 20 cc's of bright red blood in hat.  Attempted to page Dr. Gala Romney to make aware. No return page at this time.

## 2014-10-25 NOTE — Progress Notes (Signed)
  PROGRESS NOTE  [name] [name] DOB: [birth year] DOA: 10/24/2014 PCP: Michele Gravel, MD Primary Gastroenterologist: Michele Drain, MD  Summary: 80 year old woman presented with nausea, vomiting and diarrhea with acute onset day of admission with several episodes of bloody diarrhea associated with lower abdominal pain.  Assessment/Plan: 1. Acute colitis, unclear etiology with associated nausea, vomiting and diarrhea/hematochezia. 2. Hematochezia, on Plavix. Multiple NSAIDs listed. 3. Possible hepatic cirrhosis. 4. Diabetes mellitus type 2 5. Chronic diastolic heart failure 6. History of MI 2011. No history of stent placement.   GI consultation appreciated. Follow-up stool studies. Follow for bleeding, may require inpatient colonoscopy. Serial CBC. Continue empiric antibiotics.  GI follow-up as an outpatient for liver findings.  Sliding-scale insulin.  Code Status: full code DVT prophylaxis: SCDs Family Communication: none Disposition Plan: home  Michele Hodgkins, MD  [facility]  Pager [phone] If 7PM-7AM, please contact night-coverage at [email or web address], password [facility] 10/25/2014, 10:58 AM  LOS: 1 day   Consultants:  Gastroenterology  Procedures:    Antibiotics:  Ciprofloxacin 12/10 >>  Flagyl 12/10 >>  HPI/Subjective: GI has evaluated the patient. Conservative management at this point.  Still has some lower abdominal pain. Reports some bleeding earlier this morning.  Objective: Filed Vitals:   10/25/14 0049 10/25/14 0312 10/25/14 0314 10/25/14 0415  BP:   158/82 148/77  Pulse:   78 76  Temp: 98.8 F (37.1 C) 98.6 F (37 C)  99.4 F (37.4 C)  TempSrc: Oral Oral  Oral  Resp:    48  Height:      Weight:    70.8 kg (156 lb 1.4 oz)  SpO2:   92% 93%    Intake/Output Summary (Last 24 hours) at 10/25/14 1058 Last data filed at 10/25/14 0800  Gross per 24 hour  Intake    240 ml  Output      0 ml  Net    240 ml     Filed Weights   10/24/14 2329 10/25/14 0415  Weight: 69.4 kg (153 lb) 70.8 kg (156 lb 1.4 oz)    Exam:     Afebrile, vital signs are stable. No hypoxia.  General: Appears calm, comfortable.  Psych: Alert. Speech fluent and clear.  CV: Regular rate and rhythm. No murmur, rub or gallop.  Respiratory: Clear to auscultation bilaterally. No wheezes, rales or rhonchi. Normal respiratory effort.  Abdomen: Soft, nontender, nondistended  Data Reviewed:  Potassium 3.4. Basic metabolic panel unremarkable otherwise. Chronic elevation of alkaline phosphatase. Remainder of LFTs unremarkable.  WBC has normalized. CBC unremarkable. Hemoglobin stable 12.3.  C. difficile PCR is negative.  GI pathogen panel has been ordered. Stool culture pending.  CT abdomen and pelvis with descending colitis of unclear etiology. Consider infectious, inflammatory. Ischemic colitis. He less likely. Probable hepatic cirrhosis.  Scheduled Meds: . ciprofloxacin  200 mg Intravenous Q12H  . metronidazole  500 mg Intravenous Q8H   Continuous Infusions: . sodium chloride      Principal Problem:   Colitis Active Problems:   Essential hypertension   CORONARY ATHEROSCLEROSIS NATIVE CORONARY ARTERY   Nausea & vomiting   Diastolic CHF, chronic   Rectal bleeding   Time spent 20 minutes      \

## 2014-10-26 LAB — CBC
HCT: 35.5 % — ABNORMAL LOW (ref 36.0–46.0)
HCT: 31.3 % — ABNORMAL LOW (ref 36.0–46.0)
HCT: 31.5 % — ABNORMAL LOW (ref 36.0–46.0)
Hemoglobin: 11.6 g/dL — ABNORMAL LOW (ref 12.0–15.0)
Hemoglobin: 10.5 g/dL — ABNORMAL LOW (ref 12.0–15.0)
Hemoglobin: 10.5 g/dL — ABNORMAL LOW (ref 12.0–15.0)
MCH: 30.9 pg (ref 26.0–34.0)
MCH: 31.7 pg (ref 26.0–34.0)
MCH: 31.4 pg (ref 26.0–34.0)
MCHC: 32.7 g/dL (ref 30.0–36.0)
MCHC: 33.5 g/dL (ref 30.0–36.0)
MCHC: 33.3 g/dL (ref 30.0–36.0)
MCV: 94.7 fL (ref 78.0–100.0)
MCV: 94.6 fL (ref 78.0–100.0)
MCV: 94.3 fL (ref 78.0–100.0)
Platelets: 227 10*3/uL (ref 150–400)
Platelets: 195 10*3/uL (ref 150–400)
Platelets: 191 10*3/uL (ref 150–400)
RBC: 3.75 MIL/uL — ABNORMAL LOW (ref 3.87–5.11)
RBC: 3.31 MIL/uL — ABNORMAL LOW (ref 3.87–5.11)
RBC: 3.34 MIL/uL — ABNORMAL LOW (ref 3.87–5.11)
RDW: 15.3 % (ref 11.5–15.5)
RDW: 15.3 % (ref 11.5–15.5)
RDW: 15.2 % (ref 11.5–15.5)
WBC: 15.7 10*3/uL — ABNORMAL HIGH (ref 4.0–10.5)
WBC: 13.5 10*3/uL — ABNORMAL HIGH (ref 4.0–10.5)
WBC: 12.7 10*3/uL — ABNORMAL HIGH (ref 4.0–10.5)

## 2014-10-26 LAB — BASIC METABOLIC PANEL
Anion gap: 17 — ABNORMAL HIGH (ref 5–15)
BUN: 10 mg/dL (ref 6–23)
CO2: 20 mEq/L (ref 19–32)
Calcium: 9.1 mg/dL (ref 8.4–10.5)
Chloride: 105 mEq/L (ref 96–112)
Creatinine, Ser: 0.67 mg/dL (ref 0.50–1.10)
GFR calc Af Amer: >90 mL/min (ref >90)
GFR calc non Af Amer: 80 mL/min — ABNORMAL LOW (ref >90)
Glucose, Bld: 216 mg/dL — ABNORMAL HIGH (ref 70–99)
Potassium: 3.7 mEq/L (ref 3.7–5.3)
Sodium: 142 mEq/L (ref 137–147)

## 2014-10-26 LAB — GLUCOSE, CAPILLARY
Glucose-Capillary: 220 mg/dL — ABNORMAL HIGH (ref 70–99)
Glucose-Capillary: 205 mg/dL — ABNORMAL HIGH (ref 70–99)
Glucose-Capillary: 241 mg/dL — ABNORMAL HIGH (ref 70–99)
Glucose-Capillary: 206 mg/dL — ABNORMAL HIGH (ref 70–99)

## 2014-10-26 MED ORDER — LISINOPRIL 10 MG PO TABS
20.0000 mg | ORAL_TABLET | Freq: Every day | ORAL | Status: DC
  Administered 2014-10-27 – 2014-10-28 (×2): 20 mg via ORAL
  Filled 2014-10-26 (×2): qty 2

## 2014-10-26 MED ORDER — CITALOPRAM HYDROBROMIDE 20 MG PO TABS
10.0000 mg | ORAL_TABLET | Freq: Every day | ORAL | Status: DC
  Administered 2014-10-27 – 2014-10-28 (×2): 10 mg via ORAL
  Filled 2014-10-26 (×2): qty 1

## 2014-10-26 MED ORDER — INSULIN ASPART 100 UNIT/ML ~~LOC~~ SOLN
0.0000 [IU] | Freq: Three times a day (TID) | SUBCUTANEOUS | Status: DC
  Administered 2014-10-26 (×2): 3 [IU] via SUBCUTANEOUS
  Administered 2014-10-27 (×2): 2 [IU] via SUBCUTANEOUS
  Administered 2014-10-27: 5 [IU] via SUBCUTANEOUS
  Administered 2014-10-28: 3 [IU] via SUBCUTANEOUS

## 2014-10-26 MED ORDER — PANTOPRAZOLE SODIUM 40 MG PO TBEC
40.0000 mg | DELAYED_RELEASE_TABLET | Freq: Every day | ORAL | Status: DC
  Administered 2014-10-26 – 2014-10-28 (×3): 40 mg via ORAL
  Filled 2014-10-26 (×3): qty 1

## 2014-10-26 MED ORDER — METOPROLOL TARTRATE 50 MG PO TABS
50.0000 mg | ORAL_TABLET | Freq: Two times a day (BID) | ORAL | Status: DC
  Administered 2014-10-26 – 2014-10-28 (×4): 50 mg via ORAL
  Filled 2014-10-26 (×4): qty 1

## 2014-10-26 NOTE — Progress Notes (Signed)
Subjective:  Feels some better. No further vomiting but appetite slow to return. Less abdominal pain. No BM overnight per patient. Small brbpr yesterday early evening.   Objective: Vital signs in last 24 hours: Temp:  [99.4 F (37.4 C)-100.3 F (37.9 C)] 99.4 F (37.4 C) (12/11 0600) Pulse Rate:  [94] 94 (12/11 0600) Resp:  [20] 20 (12/11 0600) BP: (134-170)/(62-83) 134/83 mmHg (12/11 0600) SpO2:  [90 %-94 %] 94 % (12/11 0600) Last BM Date: 10/25/14 General:   Alert,  Well-developed, well-nourished, pleasant and cooperative in NAD Head:  Normocephalic and atraumatic. Eyes:  Sclera clear, no icterus.   Abdomen:  Soft, nontender and nondistended.  Normal bowel sounds, without guarding, and without rebound.   Extremities:  Without clubbing, deformity or edema. Neurologic:  Alert and  oriented x4;  grossly normal neurologically. Skin:  Intact without significant lesions or rashes. Psych:  Alert and cooperative. Normal mood and affect.  Intake/Output from previous day: 12/10 0701 - 12/11 0700 In: 920 [P.O.:720; IV Piggyback:200] Out: -  Intake/Output this shift:    Lab Results: CBC  Recent Labs  10/25/14 1212 10/25/14 1751 10/26/14 0556  WBC 12.7* 13.6* 15.7*  HGB 11.7* 11.2* 11.6*  HCT 34.1* 33.1* 35.5*  MCV 92.9 93.8 94.7  PLT 223 232 227   BMET  Recent Labs  10/24/14 2356 10/25/14 0621 10/26/14 0556  NA 140 143 142  K 3.8 3.4* 3.7  CL 102 104 105  CO2 _0 GLUCOSE 145* 172* 216*  BUN 26* 21 10  CREATININE 1.24* 0.97 0.67  CALCIUM 9.4 9.3 9.1   LFTs  Recent Labs  10/24/14 2356 10/25/14 0621  BILITOT 0.4 0.7  ALKPHOS 264* 269*  AST 22 21  ALT 11 11  PROT 7.4 7.6  ALBUMIN 3.3* 3.4*   No results for input(s): LIPASE in the last 72 hours. PT/INR No results for input(s): LABPROT, INR in the last 72 hours.    Imaging Studies: Ct Abdomen Pelvis W Contrast  10/25/2014   CLINICAL DATA:  Bright red blood per rectum with nausea, vomiting, and  diarrhea. Patient is on Plavix. History of diabetes, breast cancer with mastectomy, hypertension.  EXAM: CT ABDOMEN AND PELVIS WITH CONTRAST  TECHNIQUE: Multidetector CT imaging of the abdomen and pelvis was performed using the standard protocol following bolus administration of intravenous contrast.  CONTRAST:  60m OMNIPAQUE IOHEXOL 300 MG/ML SOLN, 1067mOMNIPAQUE IOHEXOL 300 MG/ML SOLN  COMPARISON:  05/16/2014  FINDINGS: Interstitial changes in the lung bases probably representing fibrosis.  Prominence of the lateral segment left lobe of liver and nodular contour suggesting hepatic cirrhosis. Surgical absence of the gallbladder. Pancreas is atrophic. Spleen, adrenal glands, inferior vena cava, and retroperitoneal lymph nodes are unremarkable. Calcification of aorta without aneurysm. Stomach and small bowel are unremarkable. No small bowel distention. Descending colon demonstrates diffuse wall thickening with pericolonic infiltration. This suggests nonspecific colitis. Infectious or inflammatory causes most likely. No free air or free fluid in the abdomen. Subcentimeter cysts in the kidneys. No solid mass or hydronephrosis.  Pelvis: Postoperative changes in the right hip. Streak artifact from hip prosthesis limits evaluation of lower pelvis. Uterus appears surgically absent. Bladder wall is not thickened. No free or loculated pelvic fluid collections. No pelvic mass or lymphadenopathy. Surgical absence of the appendix. Degenerative changes in the lumbar spine. No destructive bone lesions.  IMPRESSION: Wall thickening and pericolonic infiltration along the descending colon suggesting nonspecific focal colitis. This could represent infectious or inflammatory etiologies. Ischemic colitis  felt less likely. Probable hepatic cirrhosis.   Electronically Signed   By: Lucienne Capers M.D.   On: 10/25/2014 02:47   Mm Digital Screening Unilat R  10/04/2014   CLINICAL DATA:  Screening.  EXAM: DIGITAL SCREENING UNILATERAL  RIGHT MAMMOGRAM WITH CAD  COMPARISON:  Previous exam(s).  ACR Breast Density Category b: There are scattered areas of fibroglandular density.  FINDINGS: There are no findings suspicious for malignancy. Images were processed with CAD.  IMPRESSION: No mammographic evidence of malignancy. A result letter of this screening mammogram will be mailed directly to the patient.  RECOMMENDATION: Screening mammogram in one year. (Code:SM-B-01Y)  BI-RADS CATEGORY  1: Negative.   Electronically Signed   By: [name] M.D.   On: 10/04/2014 17:03  [2 weeks]   Assessment:  [name] with h/o chronically elevated AlkPhos, +AMA on URSO, who presents with acute onset N/V/bloody diarrhea which occurred a few hours after eating a cheeseburger. CT findings with focal colitis in descending colon and ?cirrhosis (new finding). Ddx includes infectious etiology, ischemic colitis. Currently patient is hemodynamically stable. Hgb drifted down to 11.6. WBC have increased. Patient denies cough, dysuria. Does not appear toxic. Creatinine is improved as well.   Plan: 1. Await stools. GI pathogen panel has not been collected. Cdiff pcr neg. Stool culture pend. 2. Outpatient follow up of liver findings.  3. Continue monitoring for now given signs of decreased bleeding and overall improvement.    LOS: 2 days   [name]  10/26/2014, 7:54 AM

## 2014-10-26 NOTE — Care Management Note (Unsigned)
    Page 1 of 1   10/26/2014     2:15:53 PM CARE MANAGEMENT NOTE 10/26/2014  Patient:  [name], [name]   Account Number:  [id number]  Date Initiated:  10/26/2014  Documentation initiated by:  Theophilus Kinds  Subjective/Objective Assessment:   Pt admitted from home with colitis. Pt lives with her son and daughter and pt will return home at discharge. Pt is fairly independent with ADL's.     Action/Plan:   Will continue to follow for discharge planning needs.   Anticipated DC Date:  10/30/2014   Anticipated DC Plan:  [place]  CM consult      Choice offered to / List presented to:             Status of service:  In process, will continue to follow Medicare Important Message given?  YES (If response is "NO", the following Medicare IM given date fields will be blank) Date Medicare IM given:  10/26/2014 Medicare IM given by:  Vladimir Creeks Date Additional Medicare IM given:   Additional Medicare IM given by:    Discharge Disposition:    Per UR Regulation:    If discussed at Long Length of Stay Meetings, dates discussed:    Comments:  10/26/14 [place], RN BSN CM

## 2014-10-26 NOTE — Progress Notes (Signed)
Notified MD that patient had another watery, bright red bloody stool this morning but patient was incontinent and GI pathogen was unable to be collected. MD and GI was notified. Will continue to monitor patient at this time.

## 2014-10-26 NOTE — Progress Notes (Signed)
  PROGRESS NOTE  [name] EPP:[id number] DOB: October 24, 1931 DOA: 10/24/2014 PCP: [name], MD Primary Gastroenterologist: Barney Drain, MD  Summary: 80 year old woman presented with nausea, vomiting and diarrhea with acute onset day of admission with several episodes of bloody diarrhea associated with lower abdominal pain.  Assessment/Plan: 1. Acute colitis, unclear etiology with associated nausea, vomiting and diarrhea/hematochezia. Possibly ischemic. GI following. Repeat CBC today. 2. Hematochezia, on Plavix as outpatient. Multiple NSAIDs listed. Plavix on hold. Low volume. No significant blood loss as of yet. No evidence of ongoing bleeding. 3. Possible hepatic cirrhosis. GI follow-up planned as an outpatient. 4. Diabetes mellitus type 2. Blood sugars stable. Sliding-scale insulin. 5. Chronic diastolic heart failure, appears compensated. 6. History of MI 2011. No history of stent placement.   Overall subjectively better today. Nontoxic with stable vital signs, stable hemoglobin, mild leukocytosis.  Plan to continue empiric antibiotics for colitis, await further GI recommendations. Follow CBC closely.  GI follow-up as an outpatient for liver findings.  Code Status: full code DVT prophylaxis: SCDs Family Communication: none Disposition Plan: home  Murray Hodgkins, MD  [facility]  Pager [phone] If 7PM-7AM, please contact night-coverage at [email or web address], password [facility] 10/26/2014, 7:59 AM  LOS: 2 days   Consultants:  Gastroenterology  Procedures:    Antibiotics:  Ciprofloxacin 12/10 >>  Flagyl 12/10 >>  HPI/Subjective: Nursing noted some bright red blood with bowel movement last evening.  Feeling a little better today, less abdominal pain. Had some bleeding this morning. Tolerating liquids.  Objective: Filed Vitals:   10/25/14 0415 10/25/14 1411 10/25/14 2140 10/26/14 0600  BP: 148/77 170/62 163/76 134/83  Pulse: 76 94 94 94  Temp: 99.4 F (37.4  C) 100.3 F (37.9 C) 99.5 F (37.5 C) 99.4 F (37.4 C)  TempSrc: Oral Oral Oral Oral  Resp: 48 _0 Height:      Weight: 70.8 kg (156 lb 1.4 oz)     SpO2: 93% 90% 91% 94%    Intake/Output Summary (Last 24 hours) at 10/26/14 0759 Last data filed at 10/25/14 1800  Gross per 24 hour  Intake    920 ml  Output      0 ml  Net    920 ml     Filed Weights   10/24/14 2329 10/25/14 0415  Weight: 69.4 kg (153 lb) 70.8 kg (156 lb 1.4 oz)    Exam:     Afebrile, vital signs are stable. No hypoxia.  Appears calm, comfortable. Speech fluent and clear.  Cardiovascular regular rate and rhythm. No murmur, rub or gallop. No lower extremity edema.  Respiratory clear to auscultation bilaterally. No wheezes, rales or rhonchi. Normal respiratory effort.  Abdomen soft, nondistended, minimal lower tenderness. Better than yesterday.  Data Reviewed:  Basic metabolic panel unremarkable.  WBC slightly elevated, 15.7. Hemoglobin is stable at 11.6.  Pertinent Data  C. difficile PCR negative.  GI pathogen panel has been ordered. Stool culture pending.  CT abdomen and pelvis with descending colitis of unclear etiology. Consider infectious, inflammatory. Ischemic colitis. He less likely. Probable hepatic cirrhosis.  Scheduled Meds: . ciprofloxacin  200 mg Intravenous Q12H  . metronidazole  500 mg Intravenous Q8H   Continuous Infusions:    Principal Problem:   Colitis Active Problems:   Essential hypertension   CORONARY ATHEROSCLEROSIS NATIVE CORONARY ARTERY   Nausea & vomiting   Diastolic CHF, chronic   Rectal bleeding   Time spent 20 minutes      \

## 2014-10-27 LAB — BASIC METABOLIC PANEL
Anion gap: 12 (ref 5–15)
BUN: 7 mg/dL (ref 6–23)
CO2: 23 mEq/L (ref 19–32)
Calcium: 8.6 mg/dL (ref 8.4–10.5)
Chloride: 107 mEq/L (ref 96–112)
Creatinine, Ser: 0.63 mg/dL (ref 0.50–1.10)
GFR calc Af Amer: >90 mL/min (ref >90)
GFR calc non Af Amer: 82 mL/min — ABNORMAL LOW (ref >90)
Glucose, Bld: 208 mg/dL — ABNORMAL HIGH (ref 70–99)
Potassium: 3.5 mEq/L — ABNORMAL LOW (ref 3.7–5.3)
Sodium: 142 mEq/L (ref 137–147)

## 2014-10-27 LAB — CBC
HCT: 32.5 % — ABNORMAL LOW (ref 36.0–46.0)
Hemoglobin: 10.6 g/dL — ABNORMAL LOW (ref 12.0–15.0)
MCH: 30.9 pg (ref 26.0–34.0)
MCHC: 32.6 g/dL (ref 30.0–36.0)
MCV: 94.8 fL (ref 78.0–100.0)
Platelets: 204 10*3/uL (ref 150–400)
RBC: 3.43 MIL/uL — ABNORMAL LOW (ref 3.87–5.11)
RDW: 15.3 % (ref 11.5–15.5)
WBC: 11.2 10*3/uL — ABNORMAL HIGH (ref 4.0–10.5)

## 2014-10-27 LAB — GLUCOSE, CAPILLARY
Glucose-Capillary: 186 mg/dL — ABNORMAL HIGH (ref 70–99)
Glucose-Capillary: 253 mg/dL — ABNORMAL HIGH (ref 70–99)
Glucose-Capillary: 166 mg/dL — ABNORMAL HIGH (ref 70–99)
Glucose-Capillary: 179 mg/dL — ABNORMAL HIGH (ref 70–99)

## 2014-10-27 MED ORDER — MIRTAZAPINE 30 MG PO TABS
30.0000 mg | ORAL_TABLET | Freq: Every day | ORAL | Status: DC
  Administered 2014-10-27: 30 mg via ORAL
  Filled 2014-10-27: qty 1

## 2014-10-27 MED ORDER — NAPROXEN 250 MG PO TABS: 500.0000 mg | ORAL_TABLET | Freq: Two times a day (BID) | ORAL | Status: DC

## 2014-10-27 MED ORDER — POTASSIUM CHLORIDE CRYS ER 20 MEQ PO TBCR
40.0000 meq | EXTENDED_RELEASE_TABLET | Freq: Once | ORAL | Status: AC
  Administered 2014-10-27: 40 meq via ORAL
  Filled 2014-10-27: qty 2

## 2014-10-27 MED ORDER — METRONIDAZOLE 500 MG PO TABS
500.0000 mg | ORAL_TABLET | Freq: Three times a day (TID) | ORAL | Status: DC
  Administered 2014-10-28 (×2): 500 mg via ORAL
  Filled 2014-10-27 (×3): qty 1

## 2014-10-27 MED ORDER — CIPROFLOXACIN HCL 250 MG PO TABS
500.0000 mg | ORAL_TABLET | Freq: Two times a day (BID) | ORAL | Status: DC
  Administered 2014-10-28: 500 mg via ORAL
  Filled 2014-10-27: qty 2

## 2014-10-27 MED ORDER — METRONIDAZOLE 500 MG PO TABS: 500.0000 mg | ORAL_TABLET | Freq: Three times a day (TID) | ORAL | Status: DC

## 2014-10-27 MED ORDER — METRONIDAZOLE 500 MG PO TABS
500.0000 mg | ORAL_TABLET | Freq: Once | ORAL | Status: AC
  Administered 2014-10-27: 500 mg via ORAL

## 2014-10-27 MED ORDER — INSULIN GLARGINE 100 UNIT/ML ~~LOC~~ SOLN
5.0000 [IU] | Freq: Every day | SUBCUTANEOUS | Status: DC
  Administered 2014-10-27: 5 [IU] via SUBCUTANEOUS
  Filled 2014-10-27 (×2): qty 0.05

## 2014-10-27 NOTE — Progress Notes (Signed)
Patient feeling better today. Tolerating full liquid diet. Seen with nursing staff. Per staff has not required any analgesic therapy in the past 24 hours. 2 formed bowel movements this morning with only a scant amount of blood.   Vital signs in last 24 hours: Temp:  [98.9 F (37.2 C)-99.4 F (37.4 C)] 98.9 F (37.2 C) (12/12 0541) Pulse Rate:  [74-117] 74 (12/12 0541) Resp:  [20] 20 (12/12 0541) BP: (143-193)/(66-78) 193/78 mmHg (12/12 0541) SpO2:  [94 %-97 %] 97 % (12/12 0541) Last BM Date: 10/27/14 General:   Alert,  comfortable appearing pleasant and cooperative in NAD Abdomen:  Nondistended. Positive bowel sounds. Minimal diffuse tenderness to palpation. No mass or guarding. Soft, nontender and nondistended.    Extremities:  Without clubbing or edema.    Intake/Output from previous day: 12/11 0701 - 12/12 0700 In: 680 [P.O.:480; IV Piggyback:200] Out: 700 [Urine:700] Intake/Output this shift: Total I/O In: -  Out: 400 [Urine:400]  Lab Results:  Recent Labs  10/26/14 1652 10/26/14 2227 10/27/14 0522  WBC 13.5* 12.7* 11.2*  HGB 10.5* 10.5* 10.6*  HCT 31.3* 31.5* 32.5*  PLT 195 191 204   BMET  Recent Labs  10/25/14 0621 10/26/14 0556 10/27/14 0522  NA 143 142 142  K 3.4* 3.7 3.5*  CL 104 105 107  CO2 _0 GLUCOSE 172* 216* 208*  BUN _1 CREATININE 0.97 0.67 0.63  CALCIUM 9.3 9.1 8.6    Impression:  Segmental colitis improved. Ischemic origin may be more likely than infectious etiology.             Steady clinical improvement appreciated.  Imaging changes consistent with cirrhosis.  Recommendations:  Advance to a low residue diet as tolerated. Would go ahead and plan to complete a course of oral Cipro and Flagyl although antibiotics may be superfluous in this setting.  Follow-up with Dr. Oneida Alar for cirrhosis care, etc.  If she continues to progress,  she may be able to go home from a GI standpoint in the next 24 hours or so per hospitalist  attending.

## 2014-10-27 NOTE — Progress Notes (Signed)
Pt lost IV access prior to beginning night time dose of Flagyl. Pt requested not to be stuck again unless she had to. Paged Dr and after going over notes and plans for discharge decided to change antibiotics to PO. I did explain to patient that if she is not able to go home for some reason tomorrow that we may have to start a new IV and she said that she understood.

## 2014-10-27 NOTE — Progress Notes (Signed)
PROGRESS NOTE  [name]:[id number] DOB: Jun 04, 1931 DOA: 10/24/2014 PCP: [name], MD Primary Gastroenterologist: Barney Drain, MD  Summary: 80 year old woman presented with nausea, vomiting and diarrhea with acute onset day of admission with several episodes of bloody diarrhea associated with lower abdominal pain.  Assessment/Plan: 1. Acute colitis, unclear etiology with associated nausea, vomiting and diarrhea/hematochezia.  Possibly ischemic. No fever. Leukocytosis trending downwards. GI following.  2. Hematochezia, on Plavix as outpatient. Multiple NSAIDs listed. Plavix on hold. Low volume. Hemoglobin remained stable. 3. Possible hepatic cirrhosis. GI follow-up planned as an outpatient. 4. Diabetes mellitus type 2. Blood sugars remain stable though a bit high. Sliding-scale insulin. Will add Lantus. 5. Chronic diastolic heart failure, compensated. 6. History of MI 2011. No history of stent placement. Asymptomatic.   Overall subjectively improving. Hemoglobin has stabilized. Very minimal bleeding noted. Further recommendations per GI. Plan to continue empiric antibiotics.  Monitor hypertension. May need to increase medications.  Start Lantus   Replace potassium.  GI follow-up as an outpatient for liver findings.  Discussed with daughter at bedside.  Code Status: full code DVT prophylaxis: SCDs Family Communication: none Disposition Plan: home  Murray Hodgkins, MD  [facility]  Pager [phone] If 7PM-7AM, please contact night-coverage at [email or web address], password [facility] 10/27/2014, 8:04 AM  LOS: 3 days   Consultants:  Gastroenterology  Procedures:    Antibiotics:  Ciprofloxacin 12/10 >>  Flagyl 12/10 >>  HPI/Subjective: No events charted overnight.  Overall feeling better today. Very minimal abdominal pain. No vomiting. Small amount of blood passed with bowel movement.  Objective: Filed Vitals:   10/26/14 0600 10/26/14 1447 10/26/14 2044  10/27/14 0541  BP: 134/83 143/66 158/73 193/78  Pulse: 94 117 94 74  Temp: 99.4 F (37.4 C) 99 F (37.2 C) 99.4 F (37.4 C) 98.9 F (37.2 C)  TempSrc: Oral Oral Oral Oral  Resp: _0 Height:      Weight:      SpO2: 94% 96% 94% 97%    Intake/Output Summary (Last 24 hours) at 10/27/14 0804 Last data filed at 10/26/14 2319  Gross per 24 hour  Intake    680 ml  Output    700 ml  Net    -20 ml     Filed Weights   10/24/14 2329 10/25/14 0415  Weight: 69.4 kg (153 lb) 70.8 kg (156 lb 1.4 oz)    Exam:     Afebrile, vital signs are stable. Hypertensive. No hypoxia.  Appears calm, comfortable. Overall better.  Respiratory clear to auscultation bilaterally. No wheezes, rales or rhonchi. Normal respiratory effort.  Cardiovascular regular rate and rhythm. No murmur, rub or gallop.  Abdomen soft, nontender, very minimal lower abdominal pain.  Data Reviewed:  Multiple voids. BM 2.  Potassium 3.5. Basic metabolic panel otherwise unremarkable.  Leukocytosis trending down. Hemoglobin stable at 10.6.  Pertinent Data since admission:  C. difficile PCR negative.  GI pathogen panel has been ordered. Stool culture pending.  CT abdomen and pelvis with descending colitis of unclear etiology. Consider infectious, inflammatory. Ischemic colitis. He less likely. Probable hepatic cirrhosis.  Scheduled Meds: . ciprofloxacin  200 mg Intravenous Q12H  . citalopram  10 mg Oral Daily  . insulin aspart  0-9 Units Subcutaneous TID WC  . lisinopril  20 mg Oral Daily  . metoprolol  50 mg Oral BID  . metronidazole  500 mg Intravenous Q8H  . pantoprazole  40 mg Oral Daily   Continuous Infusions:    Principal  Problem:   Colitis Active Problems:   Essential hypertension   CORONARY ATHEROSCLEROSIS NATIVE CORONARY ARTERY   Nausea & vomiting   Diastolic CHF, chronic   Rectal bleeding   Time spent 15 minutes      \

## 2014-10-28 DIAGNOSIS — K529 Noninfective gastroenteritis and colitis, unspecified: Secondary | ICD-10-CM | POA: Insufficient documentation

## 2014-10-28 DIAGNOSIS — E119 Type 2 diabetes mellitus without complications: Secondary | ICD-10-CM

## 2014-10-28 LAB — GLUCOSE, CAPILLARY
Glucose-Capillary: 202 mg/dL — ABNORMAL HIGH (ref 70–99)
Glucose-Capillary: 141 mg/dL — ABNORMAL HIGH (ref 70–99)

## 2014-10-28 LAB — CBC
HCT: 36.1 % (ref 36.0–46.0)
Hemoglobin: 11.7 g/dL — ABNORMAL LOW (ref 12.0–15.0)
MCH: 31.1 pg (ref 26.0–34.0)
MCHC: 32.4 g/dL (ref 30.0–36.0)
MCV: 96 fL (ref 78.0–100.0)
Platelets: 235 10*3/uL (ref 150–400)
RBC: 3.76 MIL/uL — ABNORMAL LOW (ref 3.87–5.11)
RDW: 14.7 % (ref 11.5–15.5)
WBC: 8.1 10*3/uL (ref 4.0–10.5)

## 2014-10-28 MED ORDER — CIPROFLOXACIN HCL 500 MG PO TABS: 500.0000 mg | ORAL_TABLET | Freq: Two times a day (BID) | ORAL | Status: DC

## 2014-10-28 MED ORDER — CLOPIDOGREL BISULFATE 75 MG PO TABS: 75.0000 mg | ORAL_TABLET | Freq: Every day | ORAL | Status: DC

## 2014-10-28 MED ORDER — METRONIDAZOLE 500 MG PO TABS: 500.0000 mg | ORAL_TABLET | Freq: Three times a day (TID) | ORAL | Status: DC

## 2014-10-28 NOTE — Progress Notes (Signed)
PROGRESS NOTE  YARET HUSH VWU:[id number] DOB: Jul 24, 1931 DOA: 10/24/2014 PCP: [name], MD Primary Gastroenterologist: Barney Drain, MD  Summary: 80 year old woman presented with nausea, vomiting and diarrhea with acute onset day of admission with several episodes of bloody diarrhea associated with lower abdominal pain.  Assessment/Plan: 1. Acute colitis, unclear etiology with associated nausea, vomiting and diarrhea/hematochezia.  Possibly ischemic. Possibly infectious. No fever. Leukocytosis resolved. GI cleared for discharge. 2. Hematochezia, on Plavix as outpatient. Multiple NSAIDs listed. Plavix held. Hemoglobin stable. 3. Possible hepatic cirrhosis. GI follow-up planned as an outpatient. 4. Diabetes mellitus type 2. Stable. 5. Chronic diastolic heart failure, compensated. 6. History of MI 2011. No history of stent placement. Asymptomatic.   Overall much improved. Bleeding stopped. Hemoglobin remained stable. Did not require any blood products.  Plan discharge home today. Complete course of ciprofloxacin and metronidazole.  May resume Plavix 12/20 no further bleeding.  Follow-up with GI as an outpatient to be arranged by GI office.  Murray Hodgkins, MD  [facility]  Pager [phone] If 7PM-7AM, please contact night-coverage at [email or web address], password [facility] 10/28/2014, 10:23 AM  LOS: 4 days   Consultants:  Gastroenterology  Procedures:    Antibiotics:  Ciprofloxacin 12/10 >> 12/16  Flagyl 12/10 >> 12/16  HPI/Subjective: Late entry.   Feeling much better. Minimal abdominal soreness. No bleeding. Tolerating diet.  Objective: Filed Vitals:   10/27/14 1456 10/27/14 2258 10/28/14 0533 10/28/14 1000  BP: 161/75 174/90 135/79 180/70  Pulse: 67 100 113 77  Temp: 98.9 F (37.2 C) 98.4 F (36.9 C) 98.7 F (37.1 C)   TempSrc: Oral Oral Oral   Resp: _0 Height:      Weight:      SpO2: 93% 99% 96%     Intake/Output Summary (Last 24 hours) at  10/28/14 1023 Last data filed at 10/28/14 0534  Gross per 24 hour  Intake    200 ml  Output   1300 ml  Net  -1100 ml     Filed Weights   10/24/14 2329 10/25/14 0415  Weight: 69.4 kg (153 lb) 70.8 kg (156 lb 1.4 oz)    Exam:     Afebrile, vital signs stable.  Alert. Speech fluent and clear. Appears calm, comfortable.  Respiratory clear to auscultation bilaterally. No wheezes, rales or rhonchi. Normal respiratory effort.  Cardiovascular regular rate and rhythm. No murmur, rub or gallop.  Abdomen soft, nontender, nondistended.  Data Reviewed:  Urine output 1300. BM 3.  Hemoglobin stable 11.7. Leukocytosis resolved.  Pertinent Data since admission:  C. difficile PCR negative.  GI pathogen panel has been ordered. Stool culture pending.  CT abdomen and pelvis with descending colitis of unclear etiology. Consider infectious, inflammatory. Ischemic colitis. He less likely. Probable hepatic cirrhosis.  Scheduled Meds: . ciprofloxacin  500 mg Oral Q12H  . citalopram  10 mg Oral Daily  . insulin aspart  0-9 Units Subcutaneous TID WC  . insulin glargine  5 Units Subcutaneous QHS  . lisinopril  20 mg Oral Daily  . metoprolol  50 mg Oral BID  . metroNIDAZOLE  500 mg Oral 3 times per day  . mirtazapine  30 mg Oral QHS  . pantoprazole  40 mg Oral Daily   Continuous Infusions:    Principal Problem:   Colitis Active Problems:   Essential hypertension   CORONARY ATHEROSCLEROSIS NATIVE CORONARY ARTERY   Nausea & vomiting   Diastolic CHF, chronic   Rectal bleeding        \

## 2014-10-28 NOTE — Progress Notes (Signed)
[name] discharged home with son and daughter per MD order.  Discharge instructions reviewed and discussed with the patient and daughter at bedside, all questions and concerns answered. Copy of instructions and scripts given to patient and daughter.    Medication List    STOP taking these medications        esomeprazole 40 MG capsule  Commonly known as:  NEXIUM     indomethacin 25 MG capsule  Commonly known as:  INDOCIN     naproxen 500 MG tablet  Commonly known as:  NAPROSYN     polyethylene glycol packet  Commonly known as:  MIRALAX / GLYCOLAX     predniSONE 5 MG Tabs tablet  Commonly known as:  STERAPRED UNI-PAK      TAKE these medications        ALPRAZolam 0.25 MG tablet  Commonly known as:  XANAX  Take one tablet by mouth twice daily as needed for anxiety     atorvastatin 10 MG tablet  Commonly known as:  LIPITOR  Take 1 tablet (10 mg total) by mouth daily.     CEREFOLIN NAC [id number] MG Tabs  Take 1 tablet by mouth daily.     cholecalciferol 1000 UNITS tablet  Commonly known as:  VITAMIN D  Take 1,000 Units by mouth every morning.     ciprofloxacin 500 MG tablet  Commonly known as:  CIPRO  Take 1 tablet (500 mg total) by mouth every 12 (twelve) hours.     citalopram 10 MG tablet  Commonly known as:  CELEXA  Take 1 tablet (10 mg total) by mouth daily.     clopidogrel 75 MG tablet  Commonly known as:  PLAVIX  Take 1 tablet (75 mg total) by mouth daily with breakfast. Resume Plavix 12/20 if no further bleeding.     DSS 100 MG Caps  Take 100 mg by mouth 2 (two) times daily.     feeding supplement (GLUCERNA SHAKE) Liqd  Take 237 mLs by mouth 3 (three) times daily between meals.     furosemide 40 MG tablet  Commonly known as:  LASIX  Take 1 tablet (40 mg total) by mouth daily.     HYDROcodone-acetaminophen 5-325 MG per tablet  Commonly known as:  NORCO/VICODIN  Take one tablet by mouth every 4 hours as needed for pain     insulin aspart  100 UNIT/ML injection  Commonly known as:  novoLOG  Inject 3 Units into the skin 3 (three) times daily before meals.     insulin glargine 100 UNIT/ML injection  Commonly known as:  LANTUS  Inject 17 Units into the skin at bedtime.     lisinopril 20 MG tablet  Commonly known as:  PRINIVIL,ZESTRIL  Take 20 mg by mouth daily.     metoprolol 50 MG tablet  Commonly known as:  LOPRESSOR  Take 1 tablet (50 mg total) by mouth 2 (two) times daily.     metroNIDAZOLE 500 MG tablet  Commonly known as:  FLAGYL  Take 1 tablet (500 mg total) by mouth every 8 (eight) hours.     mirtazapine 30 MG tablet  Commonly known as:  REMERON  Take 30 mg by mouth at bedtime.     pantoprazole 40 MG tablet  Commonly known as:  PROTONIX  Take 1 tablet (40 mg total) by mouth daily.     potassium chloride SA 20 MEQ tablet  Commonly known as:  K-DUR,KLOR-CON  Take 20 mEq by mouth 2 (  two) times daily.     tiotropium 18 MCG inhalation capsule  Commonly known as:  SPIRIVA  Place 18 mcg into inhaler and inhale daily as needed (shortness of breath).     ursodiol 500 MG tablet  Commonly known as:  URSO FORTE  Take 1 tablet (500 mg total) by mouth 2 (two) times daily with a meal.        Patients skin is clean, dry and intact, no evidence of skin break down. IV site discontinued and catheter remains intact. Site without signs and symptoms of complications. Dressing and pressure applied.  Patient escorted to car by Lovena Le, RN in a wheelchair,  no distress noted upon discharge.  [name] 10/28/2014 12:02 PM

## 2014-10-28 NOTE — Progress Notes (Signed)
She looks good this morning. Tolerating diet. (1) Normal appearing bowel movement so far today   Vital signs in last 24 hours: Temp:  [98.4 F (36.9 C)-98.9 F (37.2 C)] 98.7 F (37.1 C) (12/13 0533) Pulse Rate:  [67-113] 77 (12/13 1000) Resp:  [18-20] 20 (12/13 0533) BP: (135-180)/(70-90) 180/70 mmHg (12/13 1000) SpO2:  [93 %-99 %] 96 % (12/13 0533) Last BM Date: 10/27/14 General:   Alert,  , pleasant and cooperative in NAD Abdomen: Nondistended. Soft and non-tender to palpation. Extremities:  Without clubbing or edema.    Intake/Output from previous day: 12/12 0701 - 12/13 0700 In: 200 [IV Piggyback:200] Out: 1300 [Urine:1300] Intake/Output this shift: Total I/O In: -  Out: 400 [Urine:400]  Lab Results:  Recent Labs  10/26/14 2227 10/27/14 0522 10/28/14 0538  WBC 12.7* 11.2* 8.1  HGB 10.5* 10.6* 11.7*  HCT 31.5* 32.5* 36.1  PLT 191 204 235   BMET  Recent Labs  10/26/14 0556 10/27/14 0522  NA 142 142  K 3.7 3.5*  CL 105 107  CO2 20 23  GLUCOSE 216* 208*  BUN 10 7  CREATININE 0.67 0.63  CALCIUM 9.1 8.6    Impression:   Segmental colitis-much improved. Cirrhosis  Recommendations:   Advanced diet.  Complete course of oral Cipro and Flagyl. Office follow-up with Dr. Oneida Alar to be arranged.                                      As far as antiplatelet therapy is concerned, I would go along with her resuming Plavix     if the benefits felt to outweigh the overall risks once her course of antibiotic therapy has been completed.

## 2014-10-28 NOTE — Discharge Summary (Signed)
Physician Discharge Summary  [name] QTM:[id number] DOB: 10/01/1931 DOA: 10/24/2014  PCP: Michele Gravel, MD  Admit date: 10/24/2014 Discharge date: 10/28/2014  Recommendations for Outpatient Follow-up:  1. Follow-up colitis as well as abnormal liver findings, possible cirrhosis.  2. Can resume Plavix 12/20 if no further bleeding.   Follow-up Information    Follow up with Michele Gravel, MD. Schedule an appointment as soon as possible for a visit in 1 week.   Specialty:  Internal Medicine   Contact information:   [address] [place] [address] [phone]       Follow up with Michele Drain, MD. Schedule an appointment as soon as possible for a visit in 2 weeks.   Specialty:  Gastroenterology   Contact information:   [address] [place] [address] [phone]      Discharge Diagnoses:  1. Acute colitis, possibly ischemic, possibly infectious. 2. Low volume hematochezia. 3. Possible hepatic cirrhosis. 4. Diabetes mellitus type 2. 5. Chronic diastolic congestive heart failure.  Discharge Condition: Improved Disposition: Home  Diet recommendation: Heart healthy diabetic diet  Filed Weights   10/24/14 2329 10/25/14 0415  Weight: 69.4 kg (153 lb) 70.8 kg (156 lb 1.4 oz)    History of present illness:  80 year old woman presented with nausea, vomiting and diarrhea with acute onset day of admission with several episodes of bloody diarrhea associated with lower abdominal pain. CT revealed colitis.  Hospital Course:  [name] was placed on empiric antibiotics and followed by gastroenterology. Bleeding gradually stopped, low volume, hemoglobin did not significantly decrease and she did not require blood products. Abdominal pain gradually resolved. She is tolerating a diet and is now stable for discharge. Etiology of her bleeding was unclear, possibly ischemic versus infectious. Plan complete antibiotics as an  outpatient. She can resume her Plavix in the near future she has no further bleeding. She has no history of stroke or stent placement and MI was 2011. Low risk to withhold Plavix for now.  1. Acute colitis, unclear etiology with associated nausea, vomiting and diarrhea/hematochezia. Possibly ischemic. Possibly infectious. No fever. Leukocytosis resolved. GI cleared for discharge. 2. Hematochezia, on Plavix as outpatient. Multiple NSAIDs listed. Plavix held. Hemoglobin stable. 3. Possible hepatic cirrhosis. GI follow-up planned as an outpatient. 4. Diabetes mellitus type 2. Stable. 5. Chronic diastolic heart failure, compensated. 6. History of MI 2011. No history of stent placement. Asymptomatic.   Overall much improved. Bleeding stopped. Hemoglobin remained stable. Did not require any blood products.  Plan discharge home today. Complete course of ciprofloxacin and metronidazole.  May resume Plavix 12/20 no further bleeding.  Follow-up with GI as an outpatient to be arranged by GI office.  Consultants:  Gastroenterology  Procedures:  none  Antibiotics:  Ciprofloxacin 12/10 >> 12/16  Flagyl 12/10 >> 12/16  Discharge Instructions  Discharge Instructions    Activity as tolerated - No restrictions    Complete by:  As directed      Diet - low sodium heart healthy    Complete by:  As directed      Diet Carb Modified    Complete by:  As directed      Discharge instructions    Complete by:  As directed   Call your physician or seek immediate medical attention for bleeding, pain, or worsening of condition.          Discharge Medication List as of 10/28/2014 11:47 AM    START taking these  medications   Details  ciprofloxacin (CIPRO) 500 MG tablet Take 1 tablet (500 mg total) by mouth every 12 (twelve) hours., Starting 10/28/2014, Until Discontinued, Normal    metroNIDAZOLE (FLAGYL) 500 MG tablet Take 1 tablet (500 mg total) by mouth every 8 (eight) hours., Starting  10/28/2014, Until Discontinued, Normal      CONTINUE these medications which have CHANGED   Details  clopidogrel (PLAVIX) 75 MG tablet Take 1 tablet (75 mg total) by mouth daily with breakfast. Resume Plavix 12/20 if no further bleeding., Starting 10/28/2014, Until Discontinued, No Print      CONTINUE these medications which have NOT CHANGED   Details  ALPRAZolam (XANAX) 0.25 MG tablet Take one tablet by mouth twice daily as needed for anxiety, Print    atorvastatin (LIPITOR) 10 MG tablet Take 1 tablet (10 mg total) by mouth daily., Starting 02/20/2014, Until Discontinued, Print    cholecalciferol (VITAMIN D) 1000 UNITS tablet Take 1,000 Units by mouth every morning. , Until Discontinued, Historical Med    citalopram (CELEXA) 10 MG tablet Take 1 tablet (10 mg total) by mouth daily., Starting 04/20/2014, Until Discontinued, No Print    docusate sodium 100 MG CAPS Take 100 mg by mouth 2 (two) times daily., Starting 03/30/2014, Until Discontinued, Print    feeding supplement, GLUCERNA SHAKE, (GLUCERNA SHAKE) LIQD Take 237 mLs by mouth 3 (three) times daily between meals., Starting 03/30/2014, Until Discontinued, Print    furosemide (LASIX) 40 MG tablet Take 1 tablet (40 mg total) by mouth daily., Starting 03/30/2014, Until Discontinued, Print    HYDROcodone-acetaminophen (NORCO/VICODIN) 5-325 MG per tablet Take one tablet by mouth every 4 hours as needed for pain, No Print    insulin aspart (NOVOLOG) 100 UNIT/ML injection Inject 3 Units into the skin 3 (three) times daily before meals. , Until Discontinued, Historical Med    insulin glargine (LANTUS) 100 UNIT/ML injection Inject 17 Units into the skin at bedtime. , Until Discontinued, Historical Med    lisinopril (PRINIVIL,ZESTRIL) 20 MG tablet Take 20 mg by mouth daily., Until Discontinued, Historical Med    Methylfol-Algae-B12-Acetylcyst (CEREFOLIN NAC) 6-90.314-2-600 MG TABS Take 1 tablet by mouth daily. , Until Discontinued, Historical Med      metoprolol (LOPRESSOR) 50 MG tablet Take 1 tablet (50 mg total) by mouth 2 (two) times daily., Starting 04/20/2014, Until Discontinued, No Print    mirtazapine (REMERON) 30 MG tablet Take 30 mg by mouth at bedtime., Until Discontinued, Historical Med    pantoprazole (PROTONIX) 40 MG tablet Take 1 tablet (40 mg total) by mouth daily., Starting 08/31/2014, Until Discontinued, Normal    potassium chloride SA (K-DUR,KLOR-CON) 20 MEQ tablet Take 20 mEq by mouth 2 (two) times daily., Until Discontinued, Historical Med    tiotropium (SPIRIVA) 18 MCG inhalation capsule Place 18 mcg into inhaler and inhale daily as needed (shortness of breath)., Until Discontinued, Historical Med    ursodiol (URSO FORTE) 500 MG tablet Take 1 tablet (500 mg total) by mouth 2 (two) times daily with a meal., Starting 08/30/2014, Until Discontinued, Normal      STOP taking these medications     naproxen (NAPROSYN) 500 MG tablet      esomeprazole (NEXIUM) 40 MG capsule      indomethacin (INDOCIN) 25 MG capsule      polyethylene glycol (MIRALAX / GLYCOLAX) packet      predniSONE (STERAPRED UNI-PAK) 5 MG TABS tablet        Allergies  Allergen Reactions  . Aricept [Donepezil Hcl]  Hives, vomiting and headache  . Namenda [Memantine Hcl]     hives  . Codeine   . Latex   . Indocin [Indomethacin] Rash  . Penicillins Rash    The results of significant diagnostics from this hospitalization (including imaging, microbiology, ancillary and laboratory) are listed below for reference.    Significant Diagnostic Studies: Ct Abdomen Pelvis W Contrast  10/25/2014   CLINICAL DATA:  Bright red blood per rectum with nausea, vomiting, and diarrhea. Patient is on Plavix. History of diabetes, breast cancer with mastectomy, hypertension.  EXAM: CT ABDOMEN AND PELVIS WITH CONTRAST  TECHNIQUE: Multidetector CT imaging of the abdomen and pelvis was performed using the standard protocol following bolus administration of  intravenous contrast.  CONTRAST:  17m OMNIPAQUE IOHEXOL 300 MG/ML SOLN, 1070mOMNIPAQUE IOHEXOL 300 MG/ML SOLN  COMPARISON:  05/16/2014  FINDINGS: Interstitial changes in the lung bases probably representing fibrosis.  Prominence of the lateral segment left lobe of liver and nodular contour suggesting hepatic cirrhosis. Surgical absence of the gallbladder. Pancreas is atrophic. Spleen, adrenal glands, inferior vena cava, and retroperitoneal lymph nodes are unremarkable. Calcification of aorta without aneurysm. Stomach and small bowel are unremarkable. No small bowel distention. Descending colon demonstrates diffuse wall thickening with pericolonic infiltration. This suggests nonspecific colitis. Infectious or inflammatory causes most likely. No free air or free fluid in the abdomen. Subcentimeter cysts in the kidneys. No solid mass or hydronephrosis.  Pelvis: Postoperative changes in the right hip. Streak artifact from hip prosthesis limits evaluation of lower pelvis. Uterus appears surgically absent. Bladder wall is not thickened. No free or loculated pelvic fluid collections. No pelvic mass or lymphadenopathy. Surgical absence of the appendix. Degenerative changes in the lumbar spine. No destructive bone lesions.  IMPRESSION: Wall thickening and pericolonic infiltration along the descending colon suggesting nonspecific focal colitis. This could represent infectious or inflammatory etiologies. Ischemic colitis felt less likely. Probable hepatic cirrhosis.   Electronically Signed   By: WiLucienne Capers.D.   On: 10/25/2014 02:47   Microbiology: Recent Results (from the past 240 hour(s))  Clostridium Difficile by PCR     Status: None   Collection Time: 10/25/14  4:09 AM  Result Value Ref Range Status   C difficile by pcr NEGATIVE NEGATIVE Final  Stool culture     Status: None (Preliminary result)   Collection Time: 10/25/14  5:09 AM  Result Value Ref Range Status   Specimen Description STOOL  Final    Special Requests NONE  Final   Culture   Final    NO SUSPICIOUS COLONIES, CONTINUING TO HOLD Note: REDUCED NORMAL FLORA PRESENT Performed at [facility]  Report Status PENDING  Incomplete     Labs: Basic Metabolic Panel:  Recent Labs Lab 10/24/14 2356 10/25/14 0621 10/26/14 0556 10/27/14 0522  NA 140 143 142 142  K 3.8 3.4* 3.7 3.5*  CL 102 104 105 107  CO2 _0 GLUCOSE 145* 172* 216* 208*  BUN 26* _1 CREATININE 1.24* 0.97 0.67 0.63  CALCIUM 9.4 9.3 9.1 8.6   Liver Function Tests:  Recent Labs Lab 10/24/14 2356 10/25/14 0621  AST 22 21  ALT 11 11  ALKPHOS 264* 269*  BILITOT 0.4 0.7  PROT 7.4 7.6  ALBUMIN 3.3* 3.4*   CBC:  Recent Labs Lab 10/24/14 2356  10/26/14 0556 10/26/14 1652 10/26/14 2227 10/27/14 0522 10/28/14 0538  WBC 11.0*  < > 15.7* 13.5* 12.7* 11.2* 8.1  NEUTROABS 8.9*  --   --   --   --   --   --  HGB 11.9*  < > 11.6* 10.5* 10.5* 10.6* 11.7*  HCT 35.7*  < > 35.5* 31.3* 31.5* 32.5* 36.1  MCV 94.4  < > 94.7 94.6 94.3 94.8 96.0  PLT 224  < > 227 195 191 204 235  < > = values in this interval not displayed.    Recent Labs  02/18/14 0724 03/21/14 1741 04/17/14 0749  PROBNP 285.7 556.1* 666.2*   CBG:  Recent Labs Lab 10/27/14 0731 10/27/14 1113 10/27/14 1608 10/27/14 2108 10/28/14 0745  GLUCAP 186* 253* 166* 179* 202*    Principal Problem:   Colitis Active Problems:   Essential hypertension   CORONARY ATHEROSCLEROSIS NATIVE CORONARY ARTERY   Nausea & vomiting   Diastolic CHF, chronic   Rectal bleeding   Time coordinating discharge: 25 minutes  Signed:  Murray Hodgkins, MD [facility] 10/28/2014, 10:32 AM

## 2014-10-29 LAB — GI PATHOGEN PANEL BY PCR, STOOL
C difficile toxin A/B: NEGATIVE
Campylobacter by PCR: NEGATIVE
Cryptosporidium by PCR: NEGATIVE
E coli (ETEC) LT/ST: NEGATIVE
E coli (STEC): NEGATIVE
E coli 0157 by PCR: NEGATIVE
G lamblia by PCR: NEGATIVE
Norovirus GI/GII: NEGATIVE
Rotavirus A by PCR: NEGATIVE
Salmonella by PCR: NEGATIVE
Shigella by PCR: NEGATIVE

## 2014-10-29 LAB — STOOL CULTURE

## 2014-10-29 NOTE — Progress Notes (Signed)
UR chart review completed.

## 2014-11-12 DIAGNOSIS — K529 Noninfective gastroenteritis and colitis, unspecified: Secondary | ICD-10-CM | POA: Diagnosis not present

## 2014-11-12 DIAGNOSIS — R609 Edema, unspecified: Secondary | ICD-10-CM | POA: Diagnosis not present

## 2014-11-12 DIAGNOSIS — R197 Diarrhea, unspecified: Secondary | ICD-10-CM | POA: Diagnosis not present

## 2014-11-12 DIAGNOSIS — Z8719 Personal history of other diseases of the digestive system: Secondary | ICD-10-CM | POA: Diagnosis not present

## 2014-11-26 ENCOUNTER — Other Ambulatory Visit: Payer: Self-pay

## 2014-11-26 NOTE — Telephone Encounter (Signed)
This was originally done in October and was on back order thru [facility]. An rx was sent to [facility] and now they need it sent to her mail order again.

## 2014-11-27 ENCOUNTER — Other Ambulatory Visit: Payer: Self-pay | Admitting: *Deleted

## 2014-11-27 ENCOUNTER — Telehealth: Payer: Self-pay | Admitting: Orthopedic Surgery

## 2014-11-27 MED ORDER — HYDROCODONE-ACETAMINOPHEN 5-325 MG PO TABS: ORAL_TABLET | ORAL | Status: DC

## 2014-11-27 MED ORDER — URSODIOL 500 MG PO TABS: 500.0000 mg | ORAL_TABLET | Freq: Two times a day (BID) | ORAL | Status: DC

## 2014-11-27 NOTE — Telephone Encounter (Signed)
Received call from patient and daughter Zandra Lajeunesse (designated party release form on file) - requesting refill of pain medication: HYDROcodone-acetaminophen (NORCO/VICODIN) 5-325 MG per tablet [[id number]] - ph# [phone] or [phone]

## 2014-11-28 NOTE — Telephone Encounter (Signed)
PATIENTS DAUGHTER KAREN PICKED UP RX

## 2014-11-28 NOTE — Telephone Encounter (Signed)
Prescription available, patients daughter aware

## 2014-12-12 ENCOUNTER — Ambulatory Visit (INDEPENDENT_AMBULATORY_CARE_PROVIDER_SITE_OTHER): Payer: Medicare Other | Admitting: Gastroenterology

## 2014-12-12 ENCOUNTER — Encounter: Payer: Self-pay | Admitting: Gastroenterology

## 2014-12-12 VITALS — BP 158/76 | HR 81 | Temp 97.0°F | Ht 64.0 in | Wt 153.4 lb

## 2014-12-12 DIAGNOSIS — K529 Noninfective gastroenteritis and colitis, unspecified: Secondary | ICD-10-CM

## 2014-12-12 DIAGNOSIS — K743 Primary biliary cirrhosis: Secondary | ICD-10-CM | POA: Diagnosis not present

## 2014-12-12 NOTE — Assessment & Plan Note (Signed)
MOST LIKELY DUE TO ISCHEMIA OR INFECTION, LESS LIKELY OCCULT POLYP OR MALIGNANCY.  CONTINUE TO MONITOR SYMPTOMS. DISCUSSED MANAGEMENT OPTIONS: 1. TCS, 2. FLEX SIG, OR 3. WAIT AND SEE. PT AND DAUGHTER ELECTED TO CONTINUE TO MONITOR SYMPTOMS AND WILL CALL WITH QUESTIONS OR CONCERNS. CONTINUE PROBIOTIC.

## 2014-12-12 NOTE — Progress Notes (Signed)
Subjective:    Patient ID: [name], female    DOB: 08/07/1931, 81 y.o.   MRN: [id number]  [name], MD  HPI Adonis Huguenin but it taste bad. BMs: 1-2X(NL FORMED).  MAY HAVE RUMBLING IN HER STOMACH. GETS TIRED BUT LIKELY DUE TO CHF.  PT DENIES FEVER, CHILLS, HEMATOCHEZIA, nausea, vomiting, melena, diarrhea, CHEST PAIN, SHORTNESS OF BREATH,  constipation, abdominal pain, problems swallowing, OR heartburn or indigestion.   Past Medical History  Diagnosis Date  . History of GI bleed   . Parotid tumor   . History of breast cancer   . Type 2 diabetes mellitus   . Osteopenia   . Essential hypertension, benign   . Hyperlipidemia   . Depression   . Anxiety   . Chronic headaches   . Carotid artery disease     Bilateral ICA occlusion  . Orthostatic hypotension     Diabetic polyneuropathy/diabetic dysautonomia   . Coronary atherosclerosis of native coronary artery     Mild nonobstructive disease at cardiac catheterization May 2011  . PSVT (paroxysmal supraventricular tachycardia)   . Acute diastolic congestive heart failure 03/21/2014  . Fracture of femoral neck, right, closed 03/28/2014  . Acute delirium 06/23/2013  . Syncope and collapse 04/15/2010    Qualifier: Diagnosis of  By: Angelena Form, MD, Harrell Gave    . Pressure ulcer, heel, right, unstageable 04/17/2014  . UPPER GASTROINTESTINAL HEMORRHAGE 03/21/2007    Qualifier: Diagnosis of  By: Jonna Munro MD, Roderic Scarce    . Chronic liver disease     ?cirrhosis on 10/2014 CT. H/O elevated alkphos and +AMA on URSO.   Past Surgical History  Procedure Laterality Date  . Mastectomy  1994    Left breast -related to breast cancer  . Vesicovaginal fistula closure w/ tah  1979  . Cholecystectomy  2008  . Hemorrhoid surgery  1960s  . Laparoscopic appendectomy N/A 04/13/2013    Procedure: APPENDECTOMY LAPAROSCOPIC;  Surgeon: Donato Heinz, MD;  Location: [facility];  Service: General;  Laterality: N/A;  . Colonoscopy  2010    Dr. Oneida Alar: single  tubular adenoma, one hyperplastic polyp. Next TCS 2020 health permitting.  . Hip arthroplasty Right 03/28/2014    Procedure: ARTHROPLASTY BIPOLAR HIP;  Surgeon: Carole Civil, MD;  Location: [facility];  Service: Orthopedics;  Laterality: Right;    Allergies  Allergen Reactions  . Aricept [Donepezil Hcl]     Hives, vomiting and headache  . Namenda [Memantine Hcl]     hives  . Codeine   . Latex   . Indocin [Indomethacin] Rash  . Penicillins Rash    Current Outpatient Prescriptions  Medication Sig Dispense Refill  . ALPRAZolam (XANAX) 0.25 MG tablet Take one tablet by mouth twice daily as needed for anxiety (Patient taking differently: Take 0.25 mg by mouth 2 (two) times daily as needed for anxiety. Take one tablet by mouth twice daily as needed for anxiety)    . atorvastatin (LIPITOR) 10 MG tablet Take 1 tablet (10 mg total) by mouth daily.    . cholecalciferol (VITAMIN D) 1000 UNITS tablet Take 1,000 Units by mouth every morning.     . citalopram (CELEXA) 10 MG tablet Take 1 tablet (10 mg total) by mouth daily.    . clopidogrel (PLAVIX) 75 MG tablet Take 1 tablet (75 mg total) by mouth daily with breakfast. Resume Plavix 12/20 if no further bleeding.    . docusate sodium 100 MG CAPS Take 100 mg by mouth 2 (two) times daily.    Marland Kitchen  feeding supplement, GLUCERNA SHAKE, (GLUCERNA SHAKE) LIQD Take 237 mLs by mouth 3 (three) times daily between meals. (Patient taking differently: Take 237 mLs by mouth 3 (three) times daily between meals. Only takes one daily)    . furosemide (LASIX) 40 MG tablet Take 1 tablet (40 mg total) by mouth daily.    Marland Kitchen HYDROcodone-acetaminophen (NORCO/VICODIN) 5-325 MG per tablet Take one tablet by mouth every 4 hours as needed for pain (Patient taking differently: Only takes one tablet as needed)    . insulin aspart (NOVOLOG) 100 UNIT/ML injection Inject 3 Units into the skin 3 (three) times daily before meals.     . insulin glargine (LANTUS) 100 UNIT/ML injection  Inject 17 Units into the skin at bedtime.     Marland Kitchen lisinopril (PRINIVIL,ZESTRIL) 20 MG tablet Take 20 mg by mouth daily.    Marland Kitchen ALIGN Take 1 tablet by mouth daily.     . metoprolol (LOPRESSOR) 50 MG tablet Take 1 tablet (50 mg total) by mouth 2 (two) times daily.    . mirtazapine (REMERON) 30 MG tablet Take 30 mg by mouth at bedtime.    . pantoprazole (PROTONIX) 40 MG tablet Take 1 tablet (40 mg total) by mouth daily.    . potassium chloride SA (K-DUR,KLOR-CON) 20 MEQ tablet Take 20 mEq by mouth 2 (two) times daily.    Marland Kitchen tiotropium (SPIRIVA) 18 MCG inhalation capsule Place 18 mcg into inhaler and inhale daily as needed (shortness of breath).    . ursodiol (URSO FORTE) 500 MG tablet Take 1 tablet (500 mg total) by mouth 2 (two) times daily with a meal.    .      .       Review of Systems     Objective:   Physical Exam  Constitutional: She is oriented to person, place, and time. She appears well-developed and well-nourished. No distress.  HENT:  Head: Normocephalic and atraumatic.  Mouth/Throat: Oropharynx is clear and moist. No oropharyngeal exudate.  Eyes: Pupils are equal, round, and reactive to light. No scleral icterus.  Neck: Normal range of motion. Neck supple.  Cardiovascular: Normal rate, regular rhythm and normal heart sounds.   Pulmonary/Chest: Effort normal and breath sounds normal. No respiratory distress.  Abdominal: Soft. Bowel sounds are normal. She exhibits no distension. There is no tenderness.  Musculoskeletal: She exhibits no edema.  WALKS ASSISTED WITH A WALKER  Neurological: She is alert and oriented to person, place, and time.  NO  NEW FOCAL DEFICITS   Psychiatric:  FLAT AFFECT, NL MOOD   Vitals reviewed.         Assessment & Plan:

## 2014-12-12 NOTE — Progress Notes (Signed)
cc'ed to pcp °

## 2014-12-12 NOTE — Patient Instructions (Signed)
CONTINUE URSO.  CONTINUE A PROBIOTIC DAILY. TRY RESTORA DAILY.  PLEASE CALL WITH QUESTIONS OR CONCERNS REGARDING ABDOMINAL PAIN, DIARRHEA,OR BLEEDING.  FOLLOW UP IN JUL 2016. YOU NEED A REPEAT HFP 1 WEEK PRIOR TO VISIT.

## 2014-12-12 NOTE — Progress Notes (Signed)
ON RECALL LIST

## 2014-12-12 NOTE — Assessment & Plan Note (Signed)
ALK PHOS IMPROVED ON URSO FORTE.  CONTINUE URSO. FOLLOW UP IN JUL 2016. NEED REPEAT HFP 1 WEEK PRIOR TO VISIT. 

## 2014-12-13 ENCOUNTER — Encounter: Payer: Self-pay | Admitting: Orthopedic Surgery

## 2014-12-13 ENCOUNTER — Ambulatory Visit (INDEPENDENT_AMBULATORY_CARE_PROVIDER_SITE_OTHER): Payer: Medicare Other | Admitting: Orthopedic Surgery

## 2014-12-13 VITALS — BP 127/94 | Ht 64.0 in | Wt 153.4 lb

## 2014-12-13 DIAGNOSIS — S72001D Fracture of unspecified part of neck of right femur, subsequent encounter for closed fracture with routine healing: Secondary | ICD-10-CM

## 2014-12-13 DIAGNOSIS — M898X9 Other specified disorders of bone, unspecified site: Secondary | ICD-10-CM

## 2014-12-13 DIAGNOSIS — M948X9 Other specified disorders of cartilage, unspecified sites: Secondary | ICD-10-CM

## 2014-12-13 NOTE — Progress Notes (Signed)
Subjective:     Patient ID: [name], female   DOB: 01/27/1931, 81 y.o.   MRN: [id number] Chief Complaint  Patient presents with  . 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 She appears well-developed and well-nourished.  Musculoskeletal:       Right hip: She exhibits decreased range of motion. She exhibits normal strength, no tenderness, no bony tenderness, no crepitus and no deformity.       Left hip: She exhibits normal range of motion.       Legs: WALKER DOES WELL; DOESN'T USE IN THE HOUSE   Neurological: She is alert and oriented to person, place, and time.  Skin: Skin is warm.  Psychiatric: She has a normal mood and affect. Her behavior is normal.  Vitals reviewed.      Assessment:     Stable heterotopic ossification treated with Naprosyn. Take twice a day.    Plan:     Return in May 1 year follow-up from original surgery for x-rays [facility] lateral

## 2014-12-14 ENCOUNTER — Other Ambulatory Visit: Payer: Self-pay | Admitting: Gastroenterology

## 2014-12-14 DIAGNOSIS — K743 Primary biliary cirrhosis: Secondary | ICD-10-CM

## 2014-12-27 DIAGNOSIS — B353 Tinea pedis: Secondary | ICD-10-CM | POA: Diagnosis not present

## 2014-12-27 DIAGNOSIS — L84 Corns and callosities: Secondary | ICD-10-CM | POA: Diagnosis not present

## 2014-12-27 DIAGNOSIS — E1140 Type 2 diabetes mellitus with diabetic neuropathy, unspecified: Secondary | ICD-10-CM | POA: Diagnosis not present

## 2014-12-27 DIAGNOSIS — L602 Onychogryphosis: Secondary | ICD-10-CM | POA: Diagnosis not present

## 2015-01-07 DIAGNOSIS — E538 Deficiency of other specified B group vitamins: Secondary | ICD-10-CM | POA: Diagnosis not present

## 2015-01-07 DIAGNOSIS — E118 Type 2 diabetes mellitus with unspecified complications: Secondary | ICD-10-CM | POA: Diagnosis not present

## 2015-01-07 DIAGNOSIS — I10 Essential (primary) hypertension: Secondary | ICD-10-CM | POA: Diagnosis not present

## 2015-01-10 DIAGNOSIS — I10 Essential (primary) hypertension: Secondary | ICD-10-CM | POA: Diagnosis not present

## 2015-01-10 DIAGNOSIS — E119 Type 2 diabetes mellitus without complications: Secondary | ICD-10-CM | POA: Diagnosis not present

## 2015-01-10 DIAGNOSIS — E538 Deficiency of other specified B group vitamins: Secondary | ICD-10-CM | POA: Diagnosis not present

## 2015-02-21 ENCOUNTER — Ambulatory Visit (INDEPENDENT_AMBULATORY_CARE_PROVIDER_SITE_OTHER): Payer: Medicare Other | Admitting: Cardiovascular Disease

## 2015-02-21 ENCOUNTER — Encounter: Payer: Self-pay | Admitting: Cardiovascular Disease

## 2015-02-21 VITALS — BP 190/80 | HR 65 | Resp 12 | Ht 64.0 in | Wt 154.0 lb

## 2015-02-21 DIAGNOSIS — I471 Supraventricular tachycardia: Secondary | ICD-10-CM | POA: Diagnosis not present

## 2015-02-21 DIAGNOSIS — I2510 Atherosclerotic heart disease of native coronary artery without angina pectoris: Secondary | ICD-10-CM

## 2015-02-21 DIAGNOSIS — I10 Essential (primary) hypertension: Secondary | ICD-10-CM | POA: Diagnosis not present

## 2015-02-21 DIAGNOSIS — F17201 Nicotine dependence, unspecified, in remission: Secondary | ICD-10-CM

## 2015-02-21 NOTE — Progress Notes (Signed)
Chief Complaint  Patient presents with  . Irregular Heart Beat     History of Present Illness: 81 yo AAF with history of mild CAD, DM, HTN, hyperlipidemia, PAD, tobacco abuse admitted to [facility] May 13,2011 with a syncopal episode. Her troponin was elevated at 0.96 and her D-dimer was elevated at 0.93. CT angiogram chest without evidence of PE. EKG with T wave inversions anterior and inferior. Cardiac cath on 03/31/10 with mild disease in the LAD but no obstructive lesions. Echo with normal LV size and function with mild AI. Carotid dopplers normal at that time. Her CT chest did show centrilobular emphysema. Echo 02/17/14 with normal LV function, severe LVH, moderate TR. She had right hip surgery on 03/28/14 who was transferred to the [facility] center for rehabilitation following discharge. She was readmitted 04/18/14 with recurrent chest pain described as a smothering sensation and palpitations and was found to be in SVT. She converted with IV Cardizem. She also has severe bilateral carotid artery disease with total occlusions managed with statin therapy and antiplatelet management.  She is here today for cardiac follow up. She has been doing well. She does her own cooking and cleaning. No chest pain. No syncope. She denies palpitations, lower ext edema. No claudication. She has stopped smoking.  Admitted with colitis December 2015.   Primary Care Physician: Dr. Maudie Mercury   Last Lipid Profile: Followed in primary care.   Past Medical History  Diagnosis Date  . History of GI bleed   . Parotid tumor   . History of breast cancer   . Type 2 diabetes mellitus   . Osteopenia   . Essential hypertension, benign   . Hyperlipidemia   . Depression   . Anxiety   . Chronic headaches   . Carotid artery disease     Bilateral ICA occlusion  . Orthostatic hypotension     Diabetic polyneuropathy/diabetic dysautonomia   . Coronary atherosclerosis of native coronary artery     Mild nonobstructive disease  at cardiac catheterization May 2011  . PSVT (paroxysmal supraventricular tachycardia)   . Acute diastolic congestive heart failure 03/21/2014  . Fracture of femoral neck, right, closed 03/28/2014  . Acute delirium 06/23/2013  . Syncope and collapse 04/15/2010    Qualifier: Diagnosis of  By: Angelena Form, MD, Harrell Gave    . Pressure ulcer, heel, right, unstageable 04/17/2014  . UPPER GASTROINTESTINAL HEMORRHAGE 03/21/2007    Qualifier: Diagnosis of  By: Jonna Munro MD, Roderic Scarce    . Chronic liver disease     ?cirrhosis on 10/2014 CT. H/O elevated alkphos and +AMA on URSO.    Past Surgical History  Procedure Laterality Date  . Mastectomy  1994    Left breast -related to breast cancer  . Vesicovaginal fistula closure w/ tah  1979  . Cholecystectomy  2008  . Hemorrhoid surgery  1960s  . Laparoscopic appendectomy N/A 04/13/2013    Procedure: APPENDECTOMY LAPAROSCOPIC;  Surgeon: Donato Heinz, MD;  Location: [facility]S;  Service: General;  Laterality: N/A;  . Colonoscopy  2010    Dr. Oneida Alar: single tubular adenoma, one hyperplastic polyp. Next TCS 2020 health permitting.  . Hip arthroplasty Right 03/28/2014    Procedure: ARTHROPLASTY BIPOLAR HIP;  Surgeon: Carole Civil, MD;  Location: [facility] ORS;  Service: Orthopedics;  Laterality: Right;    Current Outpatient Prescriptions  Medication Sig Dispense Refill  . ALPRAZolam (XANAX) 0.25 MG tablet Take one tablet by mouth twice daily as needed for anxiety (Patient taking differently: Take  0.25 mg by mouth 2 (two) times daily as needed for anxiety. Take one tablet by mouth twice daily as needed for anxiety) 60 tablet 5  . atorvastatin (LIPITOR) 10 MG tablet Take 1 tablet (10 mg total) by mouth daily. 30 tablet 0  . cholecalciferol (VITAMIN D) 1000 UNITS tablet Take 1,000 Units by mouth every morning.     . citalopram (CELEXA) 10 MG tablet Take 1 tablet (10 mg total) by mouth daily.    . clopidogrel (PLAVIX) 75 MG tablet Take 1 tablet (75 mg total) by mouth  daily with breakfast. Resume Plavix 12/20 if no further bleeding.    . docusate sodium 100 MG CAPS Take 100 mg by mouth 2 (two) times daily. 10 capsule 0  . feeding supplement, GLUCERNA SHAKE, (GLUCERNA SHAKE) LIQD Take 237 mLs by mouth 3 (three) times daily between meals. (Patient taking differently: Take 237 mLs by mouth 3 (three) times daily between meals. Only takes one daily) 30 Can 0  . furosemide (LASIX) 40 MG tablet Take 1 tablet (40 mg total) by mouth daily. 30 tablet 0  . HYDROcodone-acetaminophen (NORCO/VICODIN) 5-325 MG per tablet Take one tablet by mouth every 4 hours as needed for pain (Patient taking differently: Only takes one tablet as needed) 120 tablet 0  . insulin aspart (NOVOLOG) 100 UNIT/ML injection Inject 3 Units into the skin 3 (three) times daily before meals.     . insulin glargine (LANTUS) 100 UNIT/ML injection Inject 17 Units into the skin at bedtime.     Marland Kitchen lisinopril (PRINIVIL,ZESTRIL) 20 MG tablet Take 20 mg by mouth daily.    . Methylfol-Algae-B12-Acetylcyst (CEREFOLIN NAC) 6-90.314-2-600 MG TABS Take 1 tablet by mouth daily.     . metoprolol (LOPRESSOR) 50 MG tablet Take 1 tablet (50 mg total) by mouth 2 (two) times daily.    . mirtazapine (REMERON) 30 MG tablet Take 30 mg by mouth at bedtime.    . naproxen (NAPROSYN) 500 MG tablet Take 500 mg by mouth 2 (two) times daily with a meal.    . pantoprazole (PROTONIX) 40 MG tablet Take 1 tablet (40 mg total) by mouth daily. 30 tablet 3  . potassium chloride SA (K-DUR,KLOR-CON) 20 MEQ tablet Take 20 mEq by mouth 2 (two) times daily.    Marland Kitchen tiotropium (SPIRIVA) 18 MCG inhalation capsule Place 18 mcg into inhaler and inhale daily as needed (shortness of breath).    . ursodiol (URSO FORTE) 500 MG tablet Take 1 tablet (500 mg total) by mouth 2 (two) times daily with a meal. 180 tablet 3   No current facility-administered medications for this visit.   Outpatient Encounter Prescriptions as of 02/21/2015  Medication Sig  .  ALPRAZolam (XANAX) 0.25 MG tablet Take one tablet by mouth twice daily as needed for anxiety (Patient taking differently: Take 0.25 mg by mouth 2 (two) times daily as needed for anxiety. Take one tablet by mouth twice daily as needed for anxiety)  . atorvastatin (LIPITOR) 10 MG tablet Take 1 tablet (10 mg total) by mouth daily.  . cholecalciferol (VITAMIN D) 1000 UNITS tablet Take 1,000 Units by mouth every morning.   . citalopram (CELEXA) 10 MG tablet Take 1 tablet (10 mg total) by mouth daily.  . clopidogrel (PLAVIX) 75 MG tablet Take 1 tablet (75 mg total) by mouth daily with breakfast. Resume Plavix 12/20 if no further bleeding.  . docusate sodium 100 MG CAPS Take 100 mg by mouth 2 (two) times daily.  . feeding supplement, GLUCERNA  SHAKE, (GLUCERNA SHAKE) LIQD Take 237 mLs by mouth 3 (three) times daily between meals. (Patient taking differently: Take 237 mLs by mouth 3 (three) times daily between meals. Only takes one daily)  . furosemide (LASIX) 40 MG tablet Take 1 tablet (40 mg total) by mouth daily.  Marland Kitchen HYDROcodone-acetaminophen (NORCO/VICODIN) 5-325 MG per tablet Take one tablet by mouth every 4 hours as needed for pain (Patient taking differently: Only takes one tablet as needed)  . insulin aspart (NOVOLOG) 100 UNIT/ML injection Inject 3 Units into the skin 3 (three) times daily before meals.   . insulin glargine (LANTUS) 100 UNIT/ML injection Inject 17 Units into the skin at bedtime.   Marland Kitchen lisinopril (PRINIVIL,ZESTRIL) 20 MG tablet Take 20 mg by mouth daily.  . Methylfol-Algae-B12-Acetylcyst (CEREFOLIN NAC) 6-90.314-2-600 MG TABS Take 1 tablet by mouth daily.   . metoprolol (LOPRESSOR) 50 MG tablet Take 1 tablet (50 mg total) by mouth 2 (two) times daily.  . mirtazapine (REMERON) 30 MG tablet Take 30 mg by mouth at bedtime.  . naproxen (NAPROSYN) 500 MG tablet Take 500 mg by mouth 2 (two) times daily with a meal.  . pantoprazole (PROTONIX) 40 MG tablet Take 1 tablet (40 mg total) by mouth  daily.  . potassium chloride SA (K-DUR,KLOR-CON) 20 MEQ tablet Take 20 mEq by mouth 2 (two) times daily.  Marland Kitchen tiotropium (SPIRIVA) 18 MCG inhalation capsule Place 18 mcg into inhaler and inhale daily as needed (shortness of breath).  . ursodiol (URSO FORTE) 500 MG tablet Take 1 tablet (500 mg total) by mouth 2 (two) times daily with a meal.  . [DISCONTINUED] ciprofloxacin (CIPRO) 500 MG tablet Take 1 tablet (500 mg total) by mouth every 12 (twelve) hours. (Patient not taking: Reported on 02/21/2015)  . [DISCONTINUED] metroNIDAZOLE (FLAGYL) 500 MG tablet Take 1 tablet (500 mg total) by mouth every 8 (eight) hours. (Patient not taking: Reported on 02/21/2015)    Allergies  Allergen Reactions  . Aricept [Donepezil Hcl]     Hives, vomiting and headache  . Namenda [Memantine Hcl]     hives  . Codeine   . Latex   . Indocin [Indomethacin] Rash  . Penicillins Rash    History   Social History  . Marital Status: Divorced    Spouse Name: N/A  . Number of Children: N/A  . Years of Education: N/A   Occupational History  . RETIRED FACTORY WORKER     [employer]   Social History Main Topics  . Smoking status: Former Smoker -- 0.50 packs/day for 40 years    Types: Cigarettes    Quit date: 03/30/2013  . Smokeless tobacco: Not on file     Comment: Quit since 03/2013  . Alcohol Use: No  . Drug Use: No  . Sexual Activity: No   Other Topics Concern  . 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 Started age 22.   Drug use -no   Alcohol use - no   Lives with son   Education - 62 th grade                   Family History  Problem Relation Age of Onset  . Heart failure Mother   . Cirrhosis Brother   . Lung cancer  Father     Brain METS  . Kidney cancer Mother   . Kidney cancer Sister   .  Heart attack Neg Hx   . Stroke Neg Hx   . Cancer Mother   . Cancer Father   . Cancer Sister   . Hypertension Sister   . Hypertension Mother     Review of Systems:  As stated in the HPI and otherwise negative.   BP 190/80 mmHg  Pulse 65  Resp 12  Ht _0  (1.626 m)  Wt 154 lb (69.854 kg)  BMI 26.42 kg/m2  Physical Examination: General: Well developed, well nourished, NAD HEENT: OP clear, mucus membranes moist SKIN: warm, dry. No rashes. Neuro: No focal deficits Musculoskeletal: Muscle strength 5/5 all ext Psychiatric: Mood and affect normal Neck: No JVD, no carotid bruits, no thyromegaly, no lymphadenopathy. Lungs:Clear bilaterally, no wheezes, rhonci, crackles Cardiovascular: Regular rate and rhythm. No murmurs, gallops or rubs. Abdomen:Soft. Bowel sounds present. Non-tender.  Extremities: No lower extremity edema. Pulses are 2 + in the bilateral DP/PT.  Echo 02/17/14: Left ventricle: The cavity size was normal. Wall thickness was increased in a pattern of severe LVH. There was mild focal basal hypertrophy of the septum. Systolic function was vigorous. The estimated ejection fraction was in the range of 65% to 70%. Wall motion was normal; there were no regional wall motion abnormalities. Doppler parameters are consistent with abnormal left ventricular relaxation (grade 1 diastolic dysfunction). - Left atrium: The atrium was mildly dilated. - Tricuspid valve: Moderate regurgitation. - Pulmonary arteries: Systolic pressure was mildly increased. Impressions:  - Elevated LVOT gradient of 2.9 m/s most likely related to vigorous LV function in association with proximal septal Thickening.  EKG:  EKG is ordered today. The ekg ordered today demonstrates Sinus, rate 65 bpm. 1st degree AV block. Bilateral atrial enlargement. QTc 478.   Recent Labs: 04/17/2014: Pro B Natriuretic peptide (BNP) 666.2*; TSH 1.350 04/18/2014: Magnesium 1.8 10/25/2014: ALT 11 10/27/2014: BUN 7;  Creatinine 0.63; Potassium 3.5*; Sodium 142 10/28/2014: Hemoglobin 11.7*; Platelets 235   Lipid Panel    Component Value Date/Time   CHOL 189 02/17/2014 0648   TRIG 122 02/17/2014 0648   HDL 35* 02/17/2014 0648   CHOLHDL 5.4 02/17/2014 0648   VLDL 24 02/17/2014 0648   LDLCALC 130* 02/17/2014 0648     Wt Readings from Last 3 Encounters:  02/21/15 154 lb (69.854 kg)  12/13/14 153 lb 6.4 oz (69.582 kg)  12/12/14 153 lb 6.4 oz (69.582 kg)     Other studies Reviewed: Additional studies/ records that were reviewed today include:. Review of the above records demonstrates:    Assessment and Plan:   1. CAD: Mild CAD by cath May 2011. She is on a statin, ASA and Ace-inhibitor. No changes in therapy.     2. HYPERTENSION:  BP elevated but has been controlled at home. Pt reports being nervous today about her appt.   3. Dyspnea: Stable. Likely from her COPD. Followed in Pulmonary office but she has not seen lately.   4. Tobacco abuse: She has stopped smoking.   5. Aortic insufficiency: No significant regurgitation seen on echo April 2015.    6. SVT: No recurrence. Continue metoprolol 50 mg po BID  Current medicines are reviewed at length with the patient today.  The patient does not have concerns regarding medicines.  The following changes have been made:  no change  Labs/ tests ordered today include: No orders of the defined types were placed in  this encounter.    Disposition:   FU with me in 12 months  Signed, Lauree Chandler, MD 02/21/2015 12:34 PM    [place] [place], [place], [place]  [address] Phone: [phone]; Fax: [phone]

## 2015-02-21 NOTE — Patient Instructions (Signed)
Medication Instructions:  Your physician recommends that you continue on your current medications as directed. Please refer to the Current Medication list given to you today.   Labwork: none  Testing/Procedures: none  Follow-Up: Your physician wants you to follow-up in:  12 months.  You will receive a reminder letter in the mail two months in advance. If you don't receive a letter, please call our office to schedule the follow-up appointment.

## 2015-02-27 DIAGNOSIS — E1151 Type 2 diabetes mellitus with diabetic peripheral angiopathy without gangrene: Secondary | ICD-10-CM | POA: Diagnosis not present

## 2015-02-27 DIAGNOSIS — L602 Onychogryphosis: Secondary | ICD-10-CM | POA: Diagnosis not present

## 2015-04-04 DIAGNOSIS — I10 Essential (primary) hypertension: Secondary | ICD-10-CM | POA: Diagnosis not present

## 2015-04-04 DIAGNOSIS — E119 Type 2 diabetes mellitus without complications: Secondary | ICD-10-CM | POA: Diagnosis not present

## 2015-04-09 DIAGNOSIS — E538 Deficiency of other specified B group vitamins: Secondary | ICD-10-CM | POA: Diagnosis not present

## 2015-04-09 DIAGNOSIS — F419 Anxiety disorder, unspecified: Secondary | ICD-10-CM | POA: Diagnosis not present

## 2015-04-09 DIAGNOSIS — E119 Type 2 diabetes mellitus without complications: Secondary | ICD-10-CM | POA: Diagnosis not present

## 2015-04-09 DIAGNOSIS — I10 Essential (primary) hypertension: Secondary | ICD-10-CM | POA: Diagnosis not present

## 2015-04-11 ENCOUNTER — Ambulatory Visit (INDEPENDENT_AMBULATORY_CARE_PROVIDER_SITE_OTHER): Payer: Medicare Other | Admitting: Orthopedic Surgery

## 2015-04-11 ENCOUNTER — Ambulatory Visit (INDEPENDENT_AMBULATORY_CARE_PROVIDER_SITE_OTHER): Payer: Medicare Other

## 2015-04-11 ENCOUNTER — Encounter: Payer: Self-pay | Admitting: Orthopedic Surgery

## 2015-04-11 VITALS — BP 174/83 | Ht 64.0 in | Wt 154.0 lb

## 2015-04-11 DIAGNOSIS — M948X9 Other specified disorders of cartilage, unspecified sites: Secondary | ICD-10-CM | POA: Diagnosis not present

## 2015-04-11 DIAGNOSIS — Z8781 Personal history of (healed) traumatic fracture: Secondary | ICD-10-CM | POA: Diagnosis not present

## 2015-04-11 DIAGNOSIS — S72001S Fracture of unspecified part of neck of right femur, sequela: Secondary | ICD-10-CM

## 2015-04-11 DIAGNOSIS — S72001D Fracture of unspecified part of neck of right femur, subsequent encounter for closed fracture with routine healing: Secondary | ICD-10-CM

## 2015-04-11 DIAGNOSIS — M898X9 Other specified disorders of bone, unspecified site: Secondary | ICD-10-CM

## 2015-04-11 DIAGNOSIS — I2510 Atherosclerotic heart disease of native coronary artery without angina pectoris: Secondary | ICD-10-CM

## 2015-04-11 NOTE — Progress Notes (Signed)
Patient ID: [name], female   DOB: December 01, 1930, 81 y.o.   MRN: [id number] Chief Complaint  Patient presents with  . Follow-up    4 month follow up + xray right hip    Surgery on bipolar 03/28/2014  Postop developed heterotopic ossification  Treated with anti-inflammatory. That helped. She is now walking with no cane at home and uses a cane out of the house and pain has been resolved in the thigh and groin  Current medications are citalopram 10 clavicle Pedro 75 DSS 100 Glucerna furosemide insulin lisinopril metoprolol mirtazapine naproxen pantoprazole potassium tiotropium ursodiol alprazolam atorvastatin cholecalciferol  Only thing under review of systems that we see is that she complains of lack of sleep  She is ambulatory with a cane looks good walking in the office. 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  Stable on naproxen continue  Follow-up as needed released from care

## 2015-05-01 ENCOUNTER — Encounter: Payer: Self-pay | Admitting: Gastroenterology

## 2015-05-01 ENCOUNTER — Telehealth: Payer: Self-pay | Admitting: Gastroenterology

## 2015-05-01 DIAGNOSIS — K743 Primary biliary cirrhosis: Secondary | ICD-10-CM

## 2015-05-01 NOTE — Telephone Encounter (Signed)
Patent is on the July recall list and repeat HFP is needed week prior

## 2015-05-01 NOTE — Telephone Encounter (Signed)
Orders are in box for July

## 2015-05-02 DIAGNOSIS — L84 Corns and callosities: Secondary | ICD-10-CM | POA: Diagnosis not present

## 2015-05-02 DIAGNOSIS — L602 Onychogryphosis: Secondary | ICD-10-CM | POA: Diagnosis not present

## 2015-05-02 DIAGNOSIS — E1151 Type 2 diabetes mellitus with diabetic peripheral angiopathy without gangrene: Secondary | ICD-10-CM | POA: Diagnosis not present

## 2015-05-03 ENCOUNTER — Other Ambulatory Visit: Payer: Self-pay

## 2015-05-03 DIAGNOSIS — K743 Primary biliary cirrhosis: Secondary | ICD-10-CM

## 2015-05-09 IMAGING — CT CT ABD-PELV W/ CM
2 of 5 series · 16 of 46 positions shown, 18 images · IV contrast (Omnipaque 300)
Comparison: 05/16/2014

CLINICAL DATA: Bright red blood per rectum with nausea, vomiting,
and diarrhea. Patient is on Plavix. History of diabetes, breast
cancer with mastectomy, hypertension.

EXAM:
CT ABDOMEN AND PELVIS WITH CONTRAST
TECHNIQUE: Multidetector CT imaging of the abdomen and pelvis was performed
using the standard protocol following bolus administration of
intravenous contrast.
CONTRAST:  25mL OMNIPAQUE IOHEXOL 300 MG/ML SOLN, 100mL OMNIPAQUE
IOHEXOL 300 MG/ML SOLN

[Series 2: abd_pel_with 5.0 b40f · axial · 0.76mm/px · z∈[-422,-57]mm · 13 of 83 slices shown, 15 images]
[im 5/83  soft-tissue]
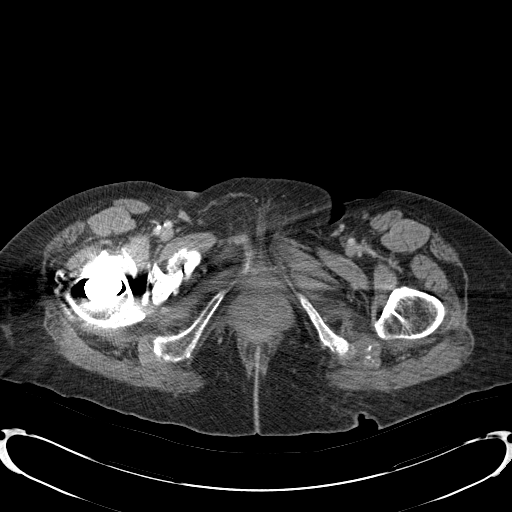
[im 5/83  bone]
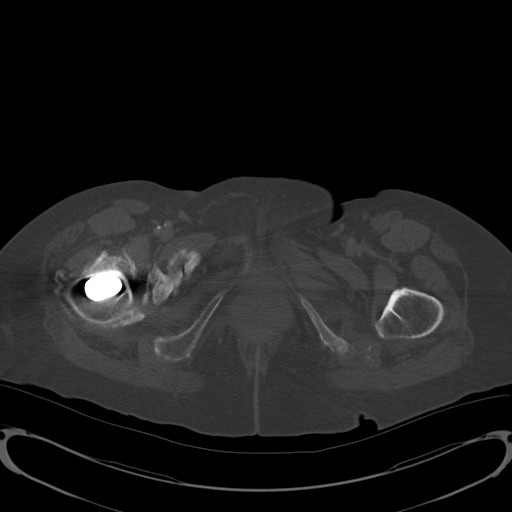
[im 10/83  soft-tissue]
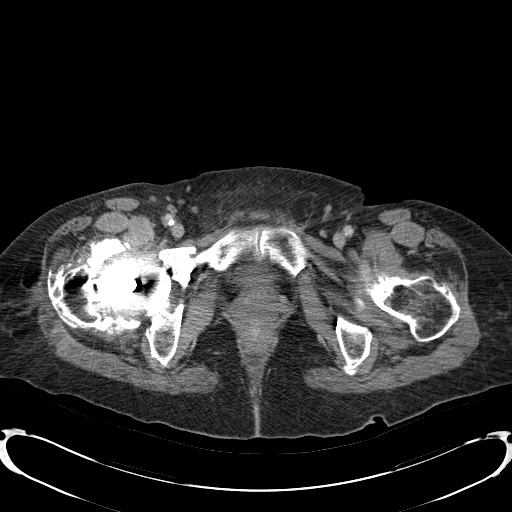
[im 20/83  soft-tissue]
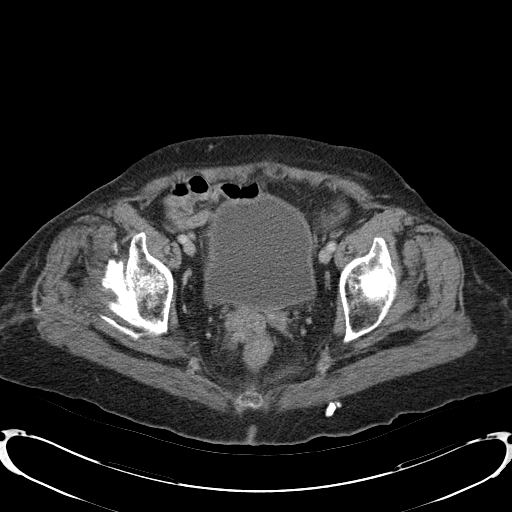
[im 29/83  soft-tissue]
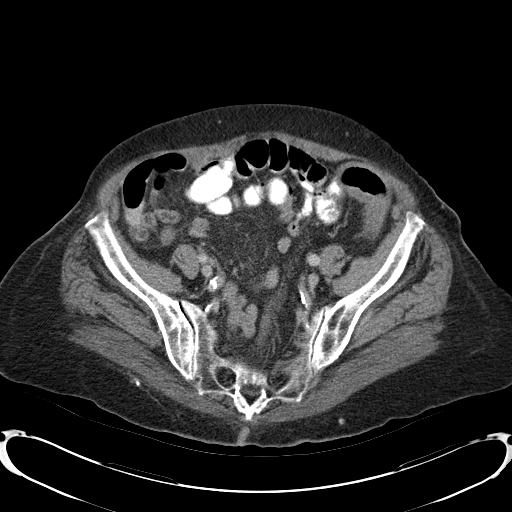
[im 34/83  soft-tissue]
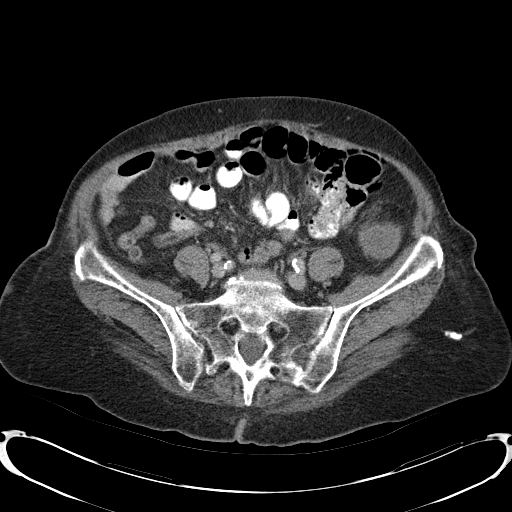
[im 39/83  soft-tissue]
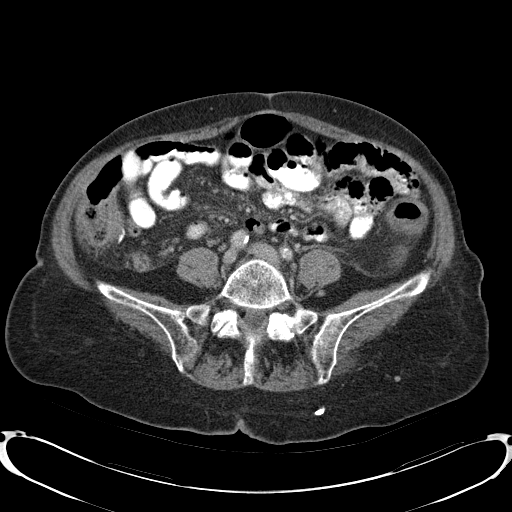
[im 44/83  soft-tissue]
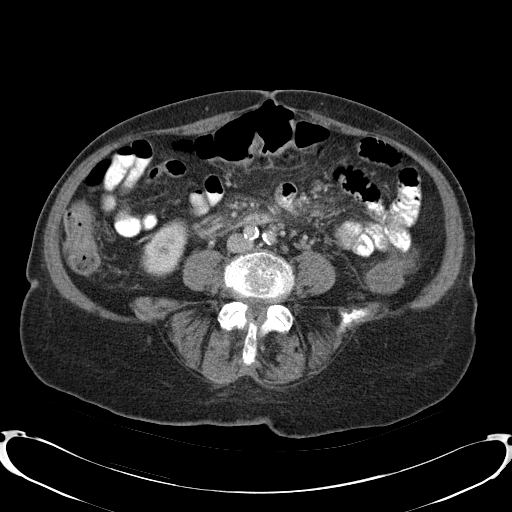
[im 49/83  soft-tissue]
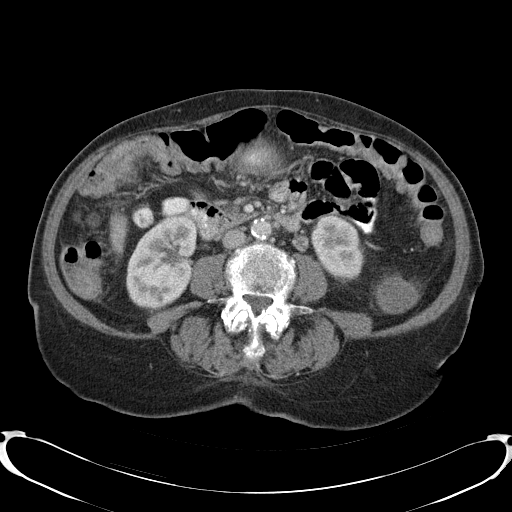
[im 54/83  soft-tissue]
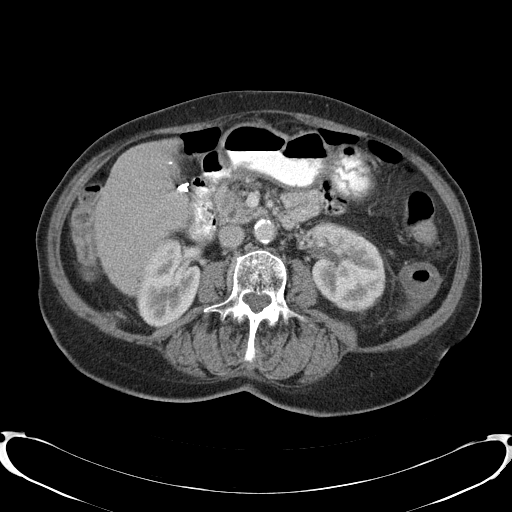
[im 54/83  bone]
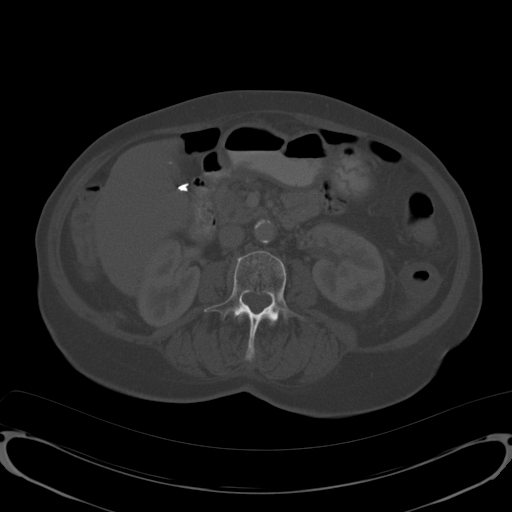
[im 58/83  soft-tissue]
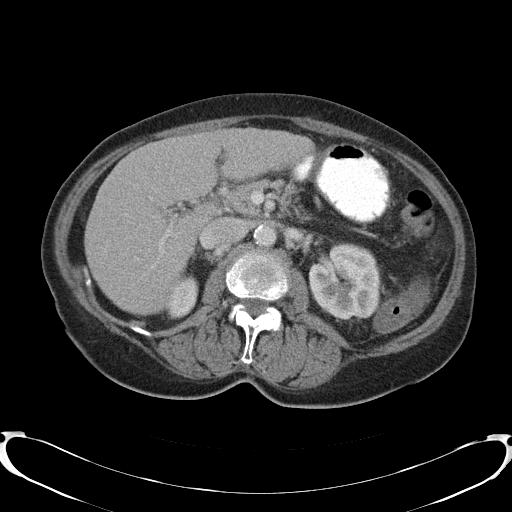
[im 68/83  soft-tissue]
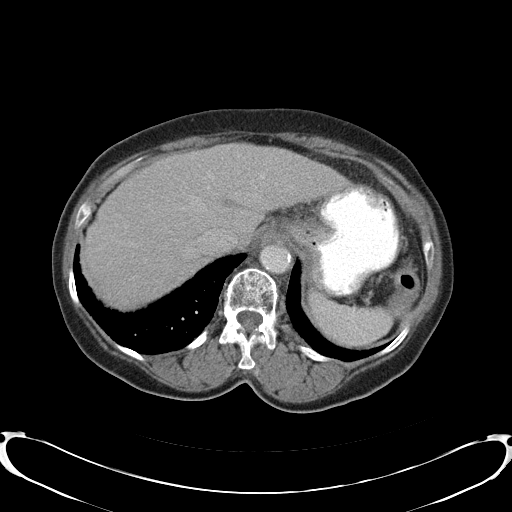
[im 73/83  soft-tissue]
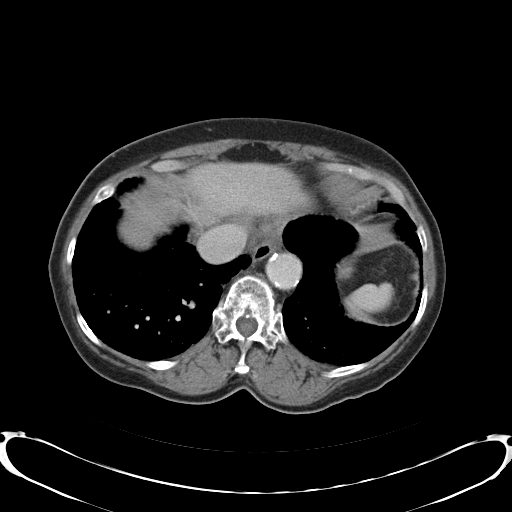
[im 78/83  soft-tissue]
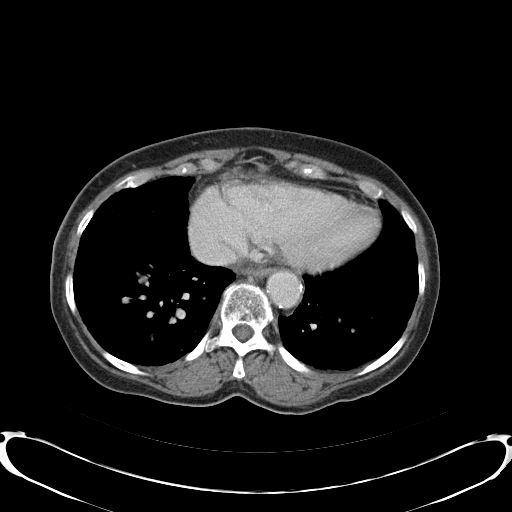

[Series 3: abd_pel_with 3.0 spo cor · coronal · 0.66mm/px · 3 of 82 slices shown]
[im 28/82  soft-tissue]
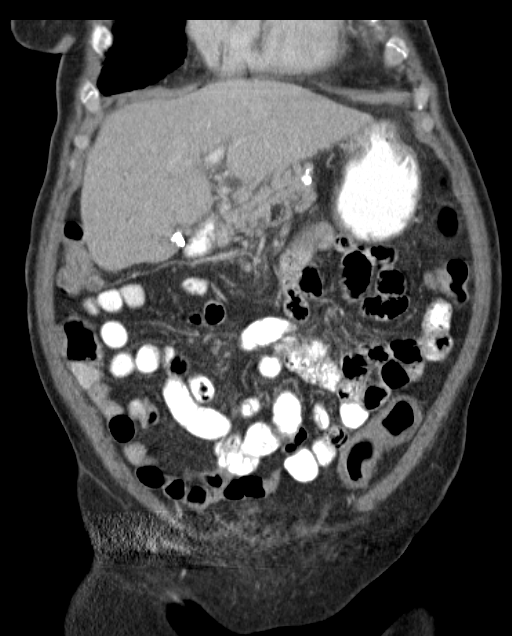
[im 37/82  soft-tissue]
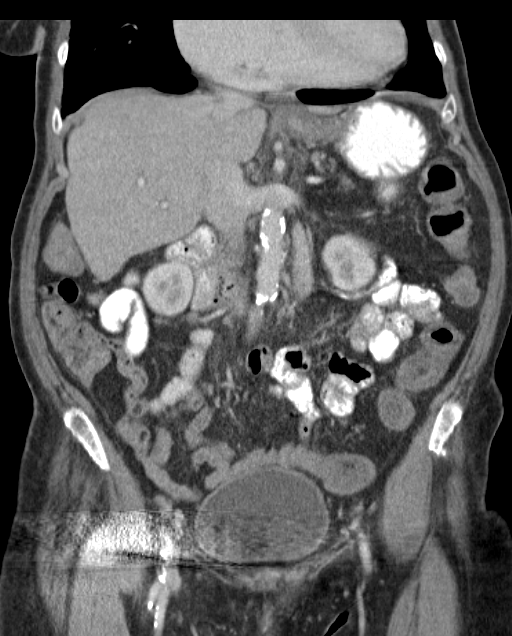
[im 46/82  soft-tissue]
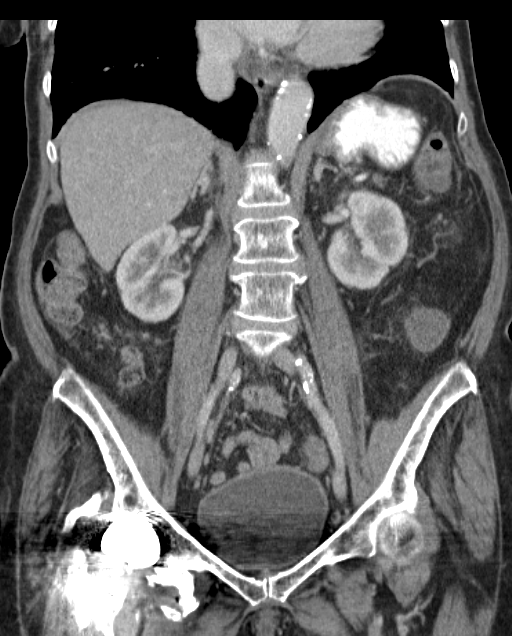

[16 of 46 positions shown; findings below may reference images not displayed]

FINDINGS: Interstitial changes in the lung bases probably representing
fibrosis.

Prominence of the lateral segment left lobe of liver and nodular
contour suggesting hepatic cirrhosis. Surgical absence of the
gallbladder. Pancreas is atrophic. Spleen, adrenal glands, inferior
vena cava, and retroperitoneal lymph nodes are unremarkable.
Calcification of aorta without aneurysm. Stomach and small bowel are
unremarkable. No small bowel distention. Descending colon
demonstrates diffuse wall thickening with pericolonic infiltration.
This suggests nonspecific colitis. Infectious or inflammatory causes
most likely. No free air or free fluid in the abdomen. Subcentimeter
cysts in the kidneys. No solid mass or hydronephrosis.

Pelvis: Postoperative changes in the right hip. Streak artifact from
hip prosthesis limits evaluation of lower pelvis. Uterus appears
surgically absent. Bladder wall is not thickened. No free or
loculated pelvic fluid collections. No pelvic mass or
lymphadenopathy. Surgical absence of the appendix. Degenerative
changes in the lumbar spine. No destructive bone lesions.
IMPRESSION: Wall thickening and pericolonic infiltration along the descending
colon suggesting nonspecific focal colitis. This could represent
infectious or inflammatory etiologies. Ischemic colitis felt less
likely. Probable hepatic cirrhosis.

## 2015-05-28 DIAGNOSIS — K743 Primary biliary cirrhosis: Secondary | ICD-10-CM | POA: Diagnosis not present

## 2015-05-29 LAB — HEPATIC FUNCTION PANEL
ALT: 11 U/L (ref 0–35)
AST: 20 U/L (ref 0–37)
Albumin: 3.7 g/dL (ref 3.5–5.2)
Alkaline Phosphatase: 150 U/L — ABNORMAL HIGH (ref 39–117)
Bilirubin, Direct: 0.2 mg/dL (ref 0.0–0.3)
Indirect Bilirubin: 0.5 mg/dL (ref 0.2–1.2)
Total Bilirubin: 0.7 mg/dL (ref 0.2–1.2)
Total Protein: 6.9 g/dL (ref 6.0–8.3)

## 2015-06-05 NOTE — Telephone Encounter (Addendum)
PLEASE CALL PT. HER ALK PHOS IS SLIGHTLY ELEVATED AT 150 BUT IT IS MOST LIKELY DUE TO MILD RIGHT SIDED HEART FAILURE. CONTINUE URSO. Next OPV AUG 31. 

## 2015-06-05 NOTE — Telephone Encounter (Signed)
PT's daughter, Karen is aware.

## 2015-06-05 NOTE — Telephone Encounter (Signed)
OV MADE 

## 2015-06-11 ENCOUNTER — Encounter (HOSPITAL_COMMUNITY): Payer: Self-pay

## 2015-06-11 ENCOUNTER — Emergency Department (HOSPITAL_COMMUNITY): Payer: Medicare Other

## 2015-06-11 ENCOUNTER — Inpatient Hospital Stay (HOSPITAL_COMMUNITY)
Admission: EM | Admit: 2015-06-11 | Discharge: 2015-06-13 | DRG: 293 | Disposition: A | Discharge status: Home / Self Care | Payer: Medicare Other | Attending: Internal Medicine | Admitting: Internal Medicine

## 2015-06-11 DIAGNOSIS — J449 Chronic obstructive pulmonary disease, unspecified: Secondary | ICD-10-CM | POA: Diagnosis present

## 2015-06-11 DIAGNOSIS — I509 Heart failure, unspecified: Secondary | ICD-10-CM | POA: Diagnosis not present

## 2015-06-11 DIAGNOSIS — E1142 Type 2 diabetes mellitus with diabetic polyneuropathy: Secondary | ICD-10-CM | POA: Diagnosis present

## 2015-06-11 DIAGNOSIS — R0600 Dyspnea, unspecified: Secondary | ICD-10-CM

## 2015-06-11 DIAGNOSIS — I10 Essential (primary) hypertension: Secondary | ICD-10-CM | POA: Diagnosis present

## 2015-06-11 DIAGNOSIS — Z853 Personal history of malignant neoplasm of breast: Secondary | ICD-10-CM | POA: Diagnosis not present

## 2015-06-11 DIAGNOSIS — R0602 Shortness of breath: Secondary | ICD-10-CM | POA: Diagnosis not present

## 2015-06-11 DIAGNOSIS — I5031 Acute diastolic (congestive) heart failure: Secondary | ICD-10-CM | POA: Diagnosis not present

## 2015-06-11 DIAGNOSIS — I5033 Acute on chronic diastolic (congestive) heart failure: Principal | ICD-10-CM | POA: Diagnosis present

## 2015-06-11 DIAGNOSIS — Z8249 Family history of ischemic heart disease and other diseases of the circulatory system: Secondary | ICD-10-CM

## 2015-06-11 DIAGNOSIS — Z801 Family history of malignant neoplasm of trachea, bronchus and lung: Secondary | ICD-10-CM | POA: Diagnosis not present

## 2015-06-11 DIAGNOSIS — Z9010 Acquired absence of unspecified breast and nipple: Secondary | ICD-10-CM | POA: Diagnosis present

## 2015-06-11 DIAGNOSIS — F411 Generalized anxiety disorder: Secondary | ICD-10-CM | POA: Diagnosis not present

## 2015-06-11 DIAGNOSIS — E785 Hyperlipidemia, unspecified: Secondary | ICD-10-CM | POA: Diagnosis present

## 2015-06-11 DIAGNOSIS — Z87891 Personal history of nicotine dependence: Secondary | ICD-10-CM

## 2015-06-11 DIAGNOSIS — G4700 Insomnia, unspecified: Secondary | ICD-10-CM | POA: Diagnosis present

## 2015-06-11 DIAGNOSIS — F329 Major depressive disorder, single episode, unspecified: Secondary | ICD-10-CM | POA: Diagnosis not present

## 2015-06-11 DIAGNOSIS — F32A Depression, unspecified: Secondary | ICD-10-CM | POA: Diagnosis present

## 2015-06-11 LAB — GLUCOSE, CAPILLARY
Glucose-Capillary: 106 mg/dL — ABNORMAL HIGH (ref 65–99)
Glucose-Capillary: 172 mg/dL — ABNORMAL HIGH (ref 65–99)

## 2015-06-11 LAB — COMPREHENSIVE METABOLIC PANEL
ALT: 22 U/L (ref 14–54)
AST: 28 U/L (ref 15–41)
Albumin: 3.5 g/dL (ref 3.5–5.0)
Alkaline Phosphatase: 137 U/L — ABNORMAL HIGH (ref 38–126)
Anion gap: 7 (ref 5–15)
BUN: 18 mg/dL (ref 6–20)
CO2: 27 mmol/L (ref 22–32)
Calcium: 8.6 mg/dL — ABNORMAL LOW (ref 8.9–10.3)
Chloride: 106 mmol/L (ref 101–111)
Creatinine, Ser: 0.81 mg/dL (ref 0.44–1.00)
GFR calc Af Amer: >60 mL/min (ref >60)
GFR calc non Af Amer: >60 mL/min (ref >60)
Glucose, Bld: 124 mg/dL — ABNORMAL HIGH (ref 65–99)
Potassium: 3.2 mmol/L — ABNORMAL LOW (ref 3.5–5.1)
Sodium: 140 mmol/L (ref 135–145)
Total Bilirubin: 0.9 mg/dL (ref 0.3–1.2)
Total Protein: 6.9 g/dL (ref 6.5–8.1)

## 2015-06-11 LAB — CBC WITH DIFFERENTIAL/PLATELET
Basophils Absolute: 0.1 10*3/uL (ref 0.0–0.1)
Basophils Relative: 2 % — ABNORMAL HIGH (ref 0–1)
Eosinophils Absolute: 0.2 10*3/uL (ref 0.0–0.7)
Eosinophils Relative: 5 % (ref 0–5)
HCT: 38.1 % (ref 36.0–46.0)
Hemoglobin: 12.6 g/dL (ref 12.0–15.0)
Lymphocytes Relative: 38 % (ref 12–46)
Lymphs Abs: 1.8 10*3/uL (ref 0.7–4.0)
MCH: 31.7 pg (ref 26.0–34.0)
MCHC: 33.1 g/dL (ref 30.0–36.0)
MCV: 95.7 fL (ref 78.0–100.0)
Monocytes Absolute: 0.3 10*3/uL (ref 0.1–1.0)
Monocytes Relative: 6 % (ref 3–12)
Neutro Abs: 2.3 10*3/uL (ref 1.7–7.7)
Neutrophils Relative %: 49 % (ref 43–77)
Platelets: 238 10*3/uL (ref 150–400)
RBC: 3.98 MIL/uL (ref 3.87–5.11)
RDW: 15.7 % — ABNORMAL HIGH (ref 11.5–15.5)
WBC: 4.7 10*3/uL (ref 4.0–10.5)

## 2015-06-11 LAB — URINALYSIS, ROUTINE W REFLEX MICROSCOPIC
Bilirubin Urine: NEGATIVE
Glucose, UA: NEGATIVE mg/dL
Ketones, ur: NEGATIVE mg/dL
Leukocytes, UA: NEGATIVE
Nitrite: NEGATIVE
Protein, ur: NEGATIVE mg/dL
Specific Gravity, Urine: 1.01 (ref 1.005–1.030)
Urobilinogen, UA: 0.2 mg/dL (ref 0.0–1.0)
pH: 7 (ref 5.0–8.0)

## 2015-06-11 LAB — URINE MICROSCOPIC-ADD ON

## 2015-06-11 LAB — BRAIN NATRIURETIC PEPTIDE: B Natriuretic Peptide: 604 pg/mL — ABNORMAL HIGH (ref 0.0–100.0)

## 2015-06-11 LAB — TROPONIN I: Troponin I: <0.03 ng/mL (ref <0.031)

## 2015-06-11 MED ORDER — INSULIN ASPART 100 UNIT/ML ~~LOC~~ SOLN
5.0000 [IU] | Freq: Three times a day (TID) | SUBCUTANEOUS | Status: DC
  Administered 2015-06-12 – 2015-06-13 (×4): 5 [IU] via SUBCUTANEOUS

## 2015-06-11 MED ORDER — FUROSEMIDE 10 MG/ML IJ SOLN
20.0000 mg | Freq: Two times a day (BID) | INTRAMUSCULAR | Status: DC
Start: 2015-06-11 — End: 2015-06-12
  Administered 2015-06-11 – 2015-06-12 (×2): 20 mg via INTRAVENOUS
  Filled 2015-06-11 (×2): qty 2

## 2015-06-11 MED ORDER — SODIUM CHLORIDE 0.9 % IJ SOLN: 3.0000 mL | INTRAMUSCULAR | Status: DC | PRN

## 2015-06-11 MED ORDER — ACETAMINOPHEN 325 MG PO TABS
650.0000 mg | ORAL_TABLET | ORAL | Status: DC | PRN
  Filled 2015-06-11: qty 2

## 2015-06-11 MED ORDER — SODIUM CHLORIDE 0.9 % IV SOLN: 250.0000 mL | INTRAVENOUS | Status: DC | PRN

## 2015-06-11 MED ORDER — SODIUM CHLORIDE 0.9 % IJ SOLN
3.0000 mL | Freq: Two times a day (BID) | INTRAMUSCULAR | Status: DC
  Administered 2015-06-11 – 2015-06-13 (×3): 3 mL via INTRAVENOUS

## 2015-06-11 MED ORDER — VITAMIN D 1000 UNITS PO TABS
1000.0000 [IU] | ORAL_TABLET | Freq: Every morning | ORAL | Status: DC
  Administered 2015-06-12 – 2015-06-13 (×2): 1000 [IU] via ORAL
  Filled 2015-06-11 (×2): qty 1

## 2015-06-11 MED ORDER — INSULIN ASPART 100 UNIT/ML ~~LOC~~ SOLN
0.0000 [IU] | Freq: Three times a day (TID) | SUBCUTANEOUS | Status: DC
  Administered 2015-06-12: 2 [IU] via SUBCUTANEOUS
  Administered 2015-06-13 (×2): 1 [IU] via SUBCUTANEOUS

## 2015-06-11 MED ORDER — POTASSIUM CHLORIDE CRYS ER 20 MEQ PO TBCR
20.0000 meq | EXTENDED_RELEASE_TABLET | Freq: Every day | ORAL | Status: DC
  Administered 2015-06-11 – 2015-06-13 (×3): 20 meq via ORAL
  Filled 2015-06-11 (×3): qty 1

## 2015-06-11 MED ORDER — INSULIN GLARGINE 100 UNIT/ML ~~LOC~~ SOLN
8.0000 [IU] | Freq: Every day | SUBCUTANEOUS | Status: DC
  Administered 2015-06-11 – 2015-06-12 (×2): 8 [IU] via SUBCUTANEOUS
  Filled 2015-06-11 (×3): qty 0.08

## 2015-06-11 MED ORDER — URSODIOL 300 MG PO CAPS
300.0000 mg | ORAL_CAPSULE | Freq: Two times a day (BID) | ORAL | Status: DC
  Administered 2015-06-11 – 2015-06-13 (×4): 300 mg via ORAL
  Filled 2015-06-11 (×6): qty 1

## 2015-06-11 MED ORDER — CETYLPYRIDINIUM CHLORIDE 0.05 % MT LIQD
7.0000 mL | Freq: Two times a day (BID) | OROMUCOSAL | Status: DC
  Administered 2015-06-13: 7 mL via OROMUCOSAL

## 2015-06-11 MED ORDER — ENOXAPARIN SODIUM 40 MG/0.4ML ~~LOC~~ SOLN
40.0000 mg | SUBCUTANEOUS | Status: DC
  Administered 2015-06-11 – 2015-06-12 (×2): 40 mg via SUBCUTANEOUS
  Filled 2015-06-11 (×2): qty 0.4

## 2015-06-11 MED ORDER — IPRATROPIUM-ALBUTEROL 0.5-2.5 (3) MG/3ML IN SOLN
3.0000 mL | Freq: Once | RESPIRATORY_TRACT | Status: AC
  Administered 2015-06-11: 3 mL via RESPIRATORY_TRACT
  Filled 2015-06-11: qty 3

## 2015-06-11 MED ORDER — CEREFOLIN NAC 6-90.314-2-600 MG PO TABS
1.0000 | ORAL_TABLET | Freq: Every day | ORAL | Status: DC
Start: 2015-06-11 — End: 2015-06-13
  Administered 2015-06-12 – 2015-06-13 (×2): 1 via ORAL

## 2015-06-11 MED ORDER — CITALOPRAM HYDROBROMIDE 20 MG PO TABS
10.0000 mg | ORAL_TABLET | Freq: Every day | ORAL | Status: DC
  Administered 2015-06-12 – 2015-06-13 (×2): 10 mg via ORAL
  Filled 2015-06-11 (×2): qty 1

## 2015-06-11 MED ORDER — CHLORHEXIDINE GLUCONATE 0.12 % MT SOLN
15.0000 mL | Freq: Two times a day (BID) | OROMUCOSAL | Status: DC
  Administered 2015-06-11 – 2015-06-12 (×2): 15 mL via OROMUCOSAL
  Filled 2015-06-11 (×2): qty 15

## 2015-06-11 MED ORDER — POTASSIUM CHLORIDE CRYS ER 20 MEQ PO TBCR
40.0000 meq | EXTENDED_RELEASE_TABLET | Freq: Once | ORAL | Status: AC
  Administered 2015-06-11: 40 meq via ORAL
  Filled 2015-06-11: qty 2

## 2015-06-11 MED ORDER — METOPROLOL TARTRATE 50 MG PO TABS
50.0000 mg | ORAL_TABLET | Freq: Two times a day (BID) | ORAL | Status: DC
  Administered 2015-06-11 – 2015-06-13 (×4): 50 mg via ORAL
  Filled 2015-06-11 (×4): qty 1

## 2015-06-11 MED ORDER — ONDANSETRON HCL 4 MG/2ML IJ SOLN: 4.0000 mg | Freq: Four times a day (QID) | INTRAMUSCULAR | Status: DC | PRN

## 2015-06-11 MED ORDER — PANTOPRAZOLE SODIUM 40 MG PO TBEC
40.0000 mg | DELAYED_RELEASE_TABLET | Freq: Every day | ORAL | Status: DC
  Administered 2015-06-12 – 2015-06-13 (×2): 40 mg via ORAL
  Filled 2015-06-11 (×2): qty 1

## 2015-06-11 MED ORDER — LISINOPRIL 10 MG PO TABS
20.0000 mg | ORAL_TABLET | Freq: Every day | ORAL | Status: DC
  Administered 2015-06-12 – 2015-06-13 (×2): 20 mg via ORAL
  Filled 2015-06-11 (×2): qty 2

## 2015-06-11 MED ORDER — ALPRAZOLAM 0.25 MG PO TABS
0.2500 mg | ORAL_TABLET | Freq: Two times a day (BID) | ORAL | Status: DC | PRN
  Administered 2015-06-11 – 2015-06-12 (×2): 0.25 mg via ORAL
  Filled 2015-06-11 (×2): qty 1

## 2015-06-11 MED ORDER — TIOTROPIUM BROMIDE MONOHYDRATE 18 MCG IN CAPS
18.0000 ug | ORAL_CAPSULE | Freq: Every day | RESPIRATORY_TRACT | Status: DC | PRN
  Filled 2015-06-11: qty 5

## 2015-06-11 MED ORDER — MIRTAZAPINE 30 MG PO TABS
30.0000 mg | ORAL_TABLET | Freq: Every day | ORAL | Status: DC
  Administered 2015-06-11 – 2015-06-12 (×2): 30 mg via ORAL
  Filled 2015-06-11 (×2): qty 1

## 2015-06-11 MED ORDER — CLOPIDOGREL BISULFATE 75 MG PO TABS
75.0000 mg | ORAL_TABLET | Freq: Every day | ORAL | Status: DC
  Administered 2015-06-12 – 2015-06-13 (×2): 75 mg via ORAL
  Filled 2015-06-11 (×2): qty 1

## 2015-06-11 MED ORDER — GLUCERNA SHAKE PO LIQD
237.0000 mL | Freq: Three times a day (TID) | ORAL | Status: DC
  Administered 2015-06-11 – 2015-06-13 (×3): 237 mL via ORAL

## 2015-06-11 MED ORDER — ATORVASTATIN CALCIUM 10 MG PO TABS
10.0000 mg | ORAL_TABLET | Freq: Every day | ORAL | Status: DC
  Administered 2015-06-11 – 2015-06-12 (×2): 10 mg via ORAL
  Filled 2015-06-11 (×2): qty 1

## 2015-06-11 MED ORDER — DOCUSATE SODIUM 100 MG PO CAPS
100.0000 mg | ORAL_CAPSULE | Freq: Two times a day (BID) | ORAL | Status: DC
  Administered 2015-06-11 – 2015-06-13 (×4): 100 mg via ORAL
  Filled 2015-06-11 (×4): qty 1

## 2015-06-11 MED ORDER — FUROSEMIDE 10 MG/ML IJ SOLN
40.0000 mg | Freq: Once | INTRAMUSCULAR | Status: AC
  Administered 2015-06-11: 40 mg via INTRAVENOUS
  Filled 2015-06-11: qty 4

## 2015-06-11 NOTE — H&P (Signed)
[facility] History and Physical  [name] QQV:[id number] DOB: 05-22-1931 DOA: 06/11/2015  Referring physician: Dr. Francine Graven, EDP PCP: [name], MD  Specialists:   Chief Complaint: Shortness of breath  HPI: [name] is a 81 y.o. female  With a history of diastolic heart failure, diabetes mellitus, type II, hypertension, who presented to the emergency department with complaints of shortness of breath. It seems the patient has had shortness of breath with increasing lower extremity edema over the past several days. Per family, patient has gained approximately 3 pounds. She has been more short of breath with exertion. She denied any chest pain or abdominal pain upon admission. Patient denies any recent illness or travel. She denies any dietary changes. In the emergency department, patient was found to have an elevated BNP604, chest x-rays also consistent with CHF. Was given 40 of Lasix IV. [facility] called for admission.  Review of Systems:  Constitutional: Denies fever, chills, diaphoresis, appetite change and fatigue.  HEENT: Denies photophobia, eye pain, redness, hearing loss, ear pain, congestion, sore throat, rhinorrhea, sneezing, mouth sores, trouble swallowing, neck pain, neck stiffness and tinnitus.   Respiratory: Complains of shortness of breath, dyspnea upon exertion. Cardiovascular: Denies chest pain, palpitations. Complains of leg swelling. Gastrointestinal: Denies nausea, vomiting, abdominal pain, diarrhea, constipation, blood in stool and abdominal distention.  Genitourinary: Denies dysuria, urgency, frequency, hematuria, flank pain and difficulty urinating.  Musculoskeletal: Denies myalgias, back pain, joint swelling, arthralgias and gait problem.  Skin: Denies pallor, rash and wound.  Neurological: Denies dizziness, seizures, syncope, weakness, light-headedness, numbness and headaches.  Hematological: Denies adenopathy. Easy bruising, personal or family  bleeding history  Psychiatric/Behavioral: Denies suicidal ideation, mood changes, confusion, nervousness, sleep disturbance and agitation  Past Medical History  Diagnosis Date  . History of GI bleed   . Parotid tumor   . History of breast cancer   . Type 2 diabetes mellitus   . Osteopenia   . Essential hypertension, benign   . Hyperlipidemia   . Depression   . Anxiety   . Chronic headaches   . Carotid artery disease     Bilateral ICA occlusion  . Orthostatic hypotension     Diabetic polyneuropathy/diabetic dysautonomia   . Coronary atherosclerosis of native coronary artery     Mild nonobstructive disease at cardiac catheterization May 2011  . PSVT (paroxysmal supraventricular tachycardia)   . Acute diastolic congestive heart failure 03/21/2014  . Fracture of femoral neck, right, closed 03/28/2014  . Acute delirium 06/23/2013  . Syncope and collapse 04/15/2010    Qualifier: Diagnosis of  By: Angelena Form, MD, Harrell Gave    . Pressure ulcer, heel, right, unstageable 04/17/2014  . UPPER GASTROINTESTINAL HEMORRHAGE 03/21/2007    Qualifier: Diagnosis of  By: Jonna Munro MD, Roderic Scarce    . Chronic liver disease     ?cirrhosis on 10/2014 CT. H/O elevated alkphos and +AMA on URSO.   Past Surgical History  Procedure Laterality Date  . Mastectomy  1994    Left breast -related to breast cancer  . Vesicovaginal fistula closure w/ tah  1979  . Cholecystectomy  2008  . Hemorrhoid surgery  1960s  . Laparoscopic appendectomy N/A 04/13/2013    Procedure: APPENDECTOMY LAPAROSCOPIC;  Surgeon: Donato Heinz, MD;  Location: [facility];  Service: General;  Laterality: N/A;  . Colonoscopy  2010    Dr. Oneida Alar: single tubular adenoma, one hyperplastic polyp. Next TCS 2020 health permitting.  . Hip arthroplasty Right 03/28/2014    Procedure: ARTHROPLASTY BIPOLAR HIP;  Surgeon: Carole Civil, MD;  Location: [facility];  Service: Orthopedics;  Laterality: Right;   Social History:  reports that she quit smoking about  2 years ago. Her smoking use included Cigarettes. She has a 20 pack-year smoking history. She does not have any smokeless tobacco history on file. She reports that she does not drink alcohol or use illicit drugs.   Allergies  Allergen Reactions  . Aricept [Donepezil Hcl]     Hives, vomiting and headache  . Namenda [Memantine Hcl]     hives  . Codeine   . Latex   . Indocin [Indomethacin] Rash  . Penicillins Rash    Family History  Problem Relation Age of Onset  . Heart failure Mother   . Cirrhosis Brother   . Lung cancer Father     Brain METS  . Kidney cancer Mother   . Kidney cancer Sister   . Heart attack Neg Hx   . Stroke Neg Hx   . Cancer Mother   . Cancer Father   . Cancer Sister   . Hypertension Sister   . Hypertension Mother     Prior to Admission medications   Medication Sig Start Date End Date Taking? Authorizing Provider  ALPRAZolam Duanne Moron) 0.25 MG tablet Take one tablet by mouth twice daily as needed for anxiety Patient taking differently: Take 0.25 mg by mouth 2 (two) times daily as needed for anxiety.  04/23/14  Yes Tiffany L Reed, DO  atorvastatin (LIPITOR) 10 MG tablet Take 1 tablet (10 mg total) by mouth daily. 02/20/14  Yes [name], MD  cholecalciferol (VITAMIN D) 1000 UNITS tablet Take 1,000 Units by mouth every morning.    Yes Historical Provider, MD  citalopram (CELEXA) 10 MG tablet Take 1 tablet (10 mg total) by mouth daily. 04/20/14  Yes Kathie Dike, MD  clopidogrel (PLAVIX) 75 MG tablet Take 1 tablet (75 mg total) by mouth daily with breakfast. Resume Plavix 12/20 if no further bleeding. 10/28/14  Yes Samuella Cota, MD  docusate sodium 100 MG CAPS Take 100 mg by mouth 2 (two) times daily. 03/30/14  Yes [name], MD  feeding supplement, GLUCERNA SHAKE, (GLUCERNA SHAKE) LIQD Take 237 mLs by mouth 3 (three) times daily between meals. Patient taking differently: Take 237 mLs by mouth 3 (three) times daily between meals. Only takes one daily  03/30/14  Yes [name], MD  furosemide (LASIX) 40 MG tablet Take 1 tablet (40 mg total) by mouth daily. 03/30/14  Yes [name], MD  insulin aspart (NOVOLOG) 100 UNIT/ML injection Inject 5 Units into the skin 3 (three) times daily before meals.    Yes Historical Provider, MD  insulin glargine (LANTUS) 100 UNIT/ML injection Inject 8 Units into the skin at bedtime.    Yes Historical Provider, MD  lisinopril (PRINIVIL,ZESTRIL) 20 MG tablet Take 20 mg by mouth daily.   Yes Historical Provider, MD  Methylfol-Algae-B12-Acetylcyst (CEREFOLIN NAC) 6-90.314-2-600 MG TABS Take 1 tablet by mouth daily.    Yes Historical Provider, MD  metoprolol (LOPRESSOR) 50 MG tablet Take 1 tablet (50 mg total) by mouth 2 (two) times daily. 04/20/14  Yes Kathie Dike, MD  mirtazapine (REMERON) 30 MG tablet Take 30 mg by mouth at bedtime.   Yes Historical Provider, MD  naproxen (NAPROSYN) 500 MG tablet Take 500 mg by mouth 2 (two) times daily with a meal.   Yes Historical Provider, MD  pantoprazole (PROTONIX) 40 MG tablet Take 1 tablet (40 mg total)  by mouth daily. 08/31/14  Yes Orvil Feil, NP  potassium chloride SA (K-DUR,KLOR-CON) 20 MEQ tablet Take 20 mEq by mouth daily.    Yes Historical Provider, MD  tiotropium (SPIRIVA) 18 MCG inhalation capsule Place 18 mcg into inhaler and inhale daily as needed (shortness of breath).   Yes Historical Provider, MD  ursodiol (URSO FORTE) 500 MG tablet Take 1 tablet (500 mg total) by mouth 2 (two) times daily with a meal. 11/27/14  Yes Orvil Feil, NP   Physical Exam: Filed Vitals:   06/11/15 1312  BP: 190/76  Pulse: 56  Temp:   Resp: 17     General: Well developed, well nourished, NAD, appears stated age  HEENT: NCAT, PERRLA, EOMI, Anicteic Sclera, mucous membranes moist.   Neck: Supple, no masses  Cardiovascular: S1 S2 auscultated, no rubs, murmurs or gallops. Regular rate and rhythm.  Respiratory: Diminished breath sounds, however clear  Abdomen: Soft,  nontender, nondistended, + bowel sounds  Extremities: warm dry without cyanosis clubbing. 2+edema in LE B/L   Neuro: AAOx3, cranial nerves grossly intact. Strength 5/5 in patient's upper and lower extremities bilaterally  Skin: Without rashes exudates or nodules  Psych: Normal affect and demeanor with intact judgement and insight  Labs on Admission:  Basic Metabolic Panel:  Recent Labs Lab 06/11/15 1238  NA 140  K 3.2*  CL 106  CO2 27  GLUCOSE 124*  BUN 18  CREATININE 0.81  CALCIUM 8.6*   Liver Function Tests:  Recent Labs Lab 06/11/15 1238  AST 28  ALT 22  ALKPHOS 137*  BILITOT 0.9  PROT 6.9  ALBUMIN 3.5   No results for input(s): LIPASE, AMYLASE in the last 168 hours. No results for input(s): AMMONIA in the last 168 hours. CBC:  Recent Labs Lab 06/11/15 1238  WBC 4.7  NEUTROABS 2.3  HGB 12.6  HCT 38.1  MCV 95.7  PLT 238   Cardiac Enzymes:  Recent Labs Lab 06/11/15 1238  TROPONINI <0.03    BNP (last 3 results)  Recent Labs  06/11/15 1238  BNP 604.0*    ProBNP (last 3 results) No results for input(s): PROBNP in the last 8760 hours.  CBG: No results for input(s): GLUCAP in the last 168 hours.  Radiological Exams on Admission: Dg Chest 2 View  06/11/2015   CLINICAL DATA:  Shortness of breath with lower extremity edema  EXAM: CHEST  2 VIEW  COMPARISON:  April 16, 2014  FINDINGS: There is interstitial edema with minimal right effusion and mild cardiomegaly. There is also pulmonary venous hypertension. There is no airspace consolidation. No adenopathy. No bone lesions.  IMPRESSION: Evidence of a degree of congestive heart failure. No airspace consolidation.   Electronically Signed   By: Lowella Grip III M.D.   On: 06/11/2015 13:26    EKG: Independently reviewed. Sinus rhythm, rate 62, first-degree AV block, PR 301  Assessment/Plan  Dyspnea secondary to Acute diastolic heart failure -Patient will be admitted. -Last echocardiogram  02/17/2014: EF of 77-82%, grade 1 diastolic dysfunction, elevated LVOT gradient -BNP 604 -Chest x-ray: Evidence of a degree of congestive heart failure, no airspace consolidation -Patient was given 40 of Lasix IV in the ER -Will continue 20 mg IV twice a day -Will obtain repeat echocardiogram. -Monitor daily weights, intake and output -Continue home medications:  Statin, plavix, lisinopril, metoprolol -Continue to monitor O2 saturations, currently on nasal cannula to maintain saturations above 92%  Diabetes mellitus, type II  -Continue home regimen along with insulin  sliding scale CBG monitoring   Depression/anxiety -Continue Celexa, Xanax as needed  Insomnia -Continue Remeron  COPD -Continue Spiriva  Hyperlipidemia -Continue atorvastatin  DVT prophylaxis: Lovenox  Code Status: DNI   Condition: Guarded  Family Communication: Daughters at bedside. Admission, patients condition and plan of care including tests being ordered have been discussed with the patient and family who indicate understanding and agree with the plan and Code Status.  Disposition Plan: Admitted.    Time spent: 65 minutes  Raziyah Vanvleck D.O. [facility] Pager [phone]  If 7PM-7AM, please contact night-coverage [email or web address] Password [facility] 06/11/2015, 2:37 PM

## 2015-06-11 NOTE — ED Notes (Signed)
Unable to obtain IV access after mult attempts by RNs. Dr. Thurnell Garbe informed.

## 2015-06-11 NOTE — ED Notes (Signed)
Pt c/o sob, swelling in ankles, feet, and abd for past 2 or 3 days.

## 2015-06-11 NOTE — ED Provider Notes (Signed)
CSN: [id number]     Arrival date & time 06/11/15  1140 History   First MD Initiated Contact with Patient 06/11/15 1207     Chief Complaint  Patient presents with  . Shortness of Breath      HPI Pt was seen at 1210. Per pt and her family, c/o gradual onset and worsening of persistent SOB and peripheral edema for the past 3 days. Has been associated with generalized fatigue/weakness. Symptoms worsen with exertion. Denies abd pain, no CP/palpitations, no N/V/D, no back pain, no calf/LE pain or unilateral swelling.    Past Medical History  Diagnosis Date  . History of GI bleed   . Parotid tumor   . History of breast cancer   . Type 2 diabetes mellitus   . Osteopenia   . Essential hypertension, benign   . Hyperlipidemia   . Depression   . Anxiety   . Chronic headaches   . Carotid artery disease     Bilateral ICA occlusion  . Orthostatic hypotension     Diabetic polyneuropathy/diabetic dysautonomia   . Coronary atherosclerosis of native coronary artery     Mild nonobstructive disease at cardiac catheterization May 2011  . PSVT (paroxysmal supraventricular tachycardia)   . Acute diastolic congestive heart failure 03/21/2014  . Fracture of femoral neck, right, closed 03/28/2014  . Acute delirium 06/23/2013  . Syncope and collapse 04/15/2010    Qualifier: Diagnosis of  By: Angelena Form, MD, Harrell Gave    . Pressure ulcer, heel, right, unstageable 04/17/2014  . UPPER GASTROINTESTINAL HEMORRHAGE 03/21/2007    Qualifier: Diagnosis of  By: Jonna Munro MD, Roderic Scarce    . Chronic liver disease     ?cirrhosis on 10/2014 CT. H/O elevated alkphos and +AMA on URSO.   Past Surgical History  Procedure Laterality Date  . Mastectomy  1994    Left breast -related to breast cancer  . Vesicovaginal fistula closure w/ tah  1979  . Cholecystectomy  2008  . Hemorrhoid surgery  1960s  . Laparoscopic appendectomy N/A 04/13/2013    Procedure: APPENDECTOMY LAPAROSCOPIC;  Surgeon: Donato Heinz, MD;  Location: [facility];  Service: General;  Laterality: N/A;  . Colonoscopy  2010    Dr. Oneida Alar: single tubular adenoma, one hyperplastic polyp. Next TCS 2020 health permitting.  . Hip arthroplasty Right 03/28/2014    Procedure: ARTHROPLASTY BIPOLAR HIP;  Surgeon: Carole Civil, MD;  Location: [facility];  Service: Orthopedics;  Laterality: Right;   Family History  Problem Relation Age of Onset  . Heart failure Mother   . Cirrhosis Brother   . Lung cancer Father     Brain METS  . Kidney cancer Mother   . Kidney cancer Sister   . Heart attack Neg Hx   . Stroke Neg Hx   . Cancer Mother   . Cancer Father   . Cancer Sister   . Hypertension Sister   . Hypertension Mother    History  Substance Use Topics  . Smoking status: Former Smoker -- 0.50 packs/day for 40 years    Types: Cigarettes    Quit date: 03/30/2013  . Smokeless tobacco: Not on file     Comment: Quit since 03/2013  . Alcohol Use: No    Review of Systems ROS: Statement: All systems negative except as marked or noted in the HPI; Constitutional: Negative for fever and chills. +generalized weakness/fatigue.; ; Eyes: Negative for eye pain, redness and discharge. ; ; ENMT: Negative for ear pain, hoarseness, nasal congestion, sinus pressure  and sore throat. ; ; Cardiovascular: Negative for chest pain, palpitations, diaphoresis, +dyspnea and peripheral edema. ; ; Respiratory: Negative for cough, wheezing and stridor. ; ; Gastrointestinal: Negative for nausea, vomiting, diarrhea, abdominal pain, blood in stool, hematemesis, jaundice and rectal bleeding. . ; ; Genitourinary: Negative for dysuria, flank pain and hematuria. ; ; Musculoskeletal: Negative for back pain and neck pain. Negative for swelling and trauma.; ; Skin: Negative for pruritus, rash, abrasions, blisters, bruising and skin lesion.; ; Neuro: Negative for headache, lightheadedness and neck stiffness. Negative for altered level of consciousness , altered mental status, extremity weakness,  paresthesias, involuntary movement, seizure and syncope.      Allergies  Aricept; Namenda; Codeine; Latex; Indocin; and Penicillins  Home Medications   Prior to Admission medications   Medication Sig Start Date End Date Taking? Authorizing Provider  ALPRAZolam Duanne Moron) 0.25 MG tablet Take one tablet by mouth twice daily as needed for anxiety Patient taking differently: Take 0.25 mg by mouth 2 (two) times daily as needed for anxiety. Take one tablet by mouth twice daily as needed for anxiety 04/23/14   Tiffany L Reed, DO  atorvastatin (LIPITOR) 10 MG tablet Take 1 tablet (10 mg total) by mouth daily. 02/20/14   [name], MD  cholecalciferol (VITAMIN D) 1000 UNITS tablet Take 1,000 Units by mouth every morning.     Historical Provider, MD  citalopram (CELEXA) 10 MG tablet Take 1 tablet (10 mg total) by mouth daily. 04/20/14   Kathie Dike, MD  clopidogrel (PLAVIX) 75 MG tablet Take 1 tablet (75 mg total) by mouth daily with breakfast. Resume Plavix 12/20 if no further bleeding. 10/28/14   Samuella Cota, MD  docusate sodium 100 MG CAPS Take 100 mg by mouth 2 (two) times daily. 03/30/14   [name], MD  feeding supplement, GLUCERNA SHAKE, (GLUCERNA SHAKE) LIQD Take 237 mLs by mouth 3 (three) times daily between meals. Patient taking differently: Take 237 mLs by mouth 3 (three) times daily between meals. Only takes one daily 03/30/14   [name], MD  furosemide (LASIX) 40 MG tablet Take 1 tablet (40 mg total) by mouth daily. 03/30/14   [name], MD  insulin aspart (NOVOLOG) 100 UNIT/ML injection Inject 3 Units into the skin 3 (three) times daily before meals.     Historical Provider, MD  insulin glargine (LANTUS) 100 UNIT/ML injection Inject 17 Units into the skin at bedtime.     Historical Provider, MD  lisinopril (PRINIVIL,ZESTRIL) 20 MG tablet Take 20 mg by mouth daily.    Historical Provider, MD  Methylfol-Algae-B12-Acetylcyst (CEREFOLIN NAC) 6-90.314-2-600 MG TABS  Take 1 tablet by mouth daily.     Historical Provider, MD  metoprolol (LOPRESSOR) 50 MG tablet Take 1 tablet (50 mg total) by mouth 2 (two) times daily. 04/20/14   Kathie Dike, MD  mirtazapine (REMERON) 30 MG tablet Take 30 mg by mouth at bedtime.    Historical Provider, MD  naproxen (NAPROSYN) 500 MG tablet Take 500 mg by mouth 2 (two) times daily with a meal.    Historical Provider, MD  pantoprazole (PROTONIX) 40 MG tablet Take 1 tablet (40 mg total) by mouth daily. 08/31/14   Orvil Feil, NP  potassium chloride SA (K-DUR,KLOR-CON) 20 MEQ tablet Take 20 mEq by mouth 2 (two) times daily.    Historical Provider, MD  tiotropium (SPIRIVA) 18 MCG inhalation capsule Place 18 mcg into inhaler and inhale daily as needed (shortness of breath).    Historical Provider,  MD  ursodiol (URSO FORTE) 500 MG tablet Take 1 tablet (500 mg total) by mouth 2 (two) times daily with a meal. 11/27/14   Orvil Feil, NP   BP 190/76 mmHg  Pulse 56  Temp(Src) 98.5 F (36.9 C) (Oral)  Resp 17  Ht _0  (1.626 m)  Wt 169 lb (76.658 kg)  BMI 28.99 kg/m2  SpO2 93% Physical Exam  1215: Physical examination:  Nursing notes reviewed; Vital signs and O2 SAT reviewed;  Constitutional: Well developed, Well nourished, Well hydrated, In no acute distress; Head:  Normocephalic, atraumatic; Eyes: EOMI, PERRL, No scleral icterus; ENMT: Mouth and pharynx normal, Mucous membranes moist; Neck: Supple, Full range of motion, No lymphadenopathy; Cardiovascular: Regular rate and rhythm, No gallop; Respiratory: Breath sounds coarse & equal bilaterally, No wheezes.  Speaking full sentences with ease, Normal respiratory effort/excursion; Chest: Nontender, Movement normal; Abdomen: Soft, Nontender, Nondistended, Normal bowel sounds; Genitourinary: No CVA tenderness; Extremities: Pulses normal, No tenderness, +1 pedal edema bilat..; Neuro: AA&Ox3, Major CN grossly intact.  Speech clear. No gross focal motor or sensory deficits in extremities.; Skin:  Color normal, Warm, Dry.   ED Course  Procedures     EKG Interpretation   Date/Time:  Tuesday June 11 2015 12:00:55 EDT Ventricular Rate:  62 PR Interval:  301 QRS Duration: 93 QT Interval:  470 QTC Calculation: 477 R Axis:   -74 Text Interpretation:  Sinus rhythm with 1st degree A-V block Left axis  deviation Left anterior fascicular block Consider RVH w/ secondary repol  abnormality Nonspecific ST and T wave abnormality Baseline wander Artifact  When compared with ECG of 04/17/2014 Nonspecific ST and T wave abnormality  is now Present Confirmed by [facility]  MD, Nunzio Cory [phone]) on 06/11/2015  1:17:47 PM      MDM  MDM Reviewed: previous chart, nursing note and vitals Reviewed previous: labs and ECG Interpretation: labs, ECG and x-ray      Results for orders placed or performed during the hospital encounter of 06/11/15  CBC with Differential  Result Value Ref Range   WBC 4.7 4.0 - 10.5 K/uL   RBC 3.98 3.87 - 5.11 MIL/uL   Hemoglobin 12.6 12.0 - 15.0 g/dL   HCT 38.1 36.0 - 46.0 %   MCV 95.7 78.0 - 100.0 fL   MCH 31.7 26.0 - 34.0 pg   MCHC 33.1 30.0 - 36.0 g/dL   RDW 15.7 (H) 11.5 - 15.5 %   Platelets 238 150 - 400 K/uL   Neutrophils Relative % 49 43 - 77 %   Neutro Abs 2.3 1.7 - 7.7 K/uL   Lymphocytes Relative 38 12 - 46 %   Lymphs Abs 1.8 0.7 - 4.0 K/uL   Monocytes Relative 6 3 - 12 %   Monocytes Absolute 0.3 0.1 - 1.0 K/uL   Eosinophils Relative 5 0 - 5 %   Eosinophils Absolute 0.2 0.0 - 0.7 K/uL   Basophils Relative 2 (H) 0 - 1 %   Basophils Absolute 0.1 0.0 - 0.1 K/uL  Comprehensive metabolic panel  Result Value Ref Range   Sodium 140 135 - 145 mmol/L   Potassium 3.2 (L) 3.5 - 5.1 mmol/L   Chloride 106 101 - 111 mmol/L   CO2 27 22 - 32 mmol/L   Glucose, Bld 124 (H) 65 - 99 mg/dL   BUN 18 6 - 20 mg/dL   Creatinine, Ser 0.81 0.44 - 1.00 mg/dL   Calcium 8.6 (L) 8.9 - 10.3 mg/dL   Total Protein  6.9 6.5 - 8.1 g/dL   Albumin 3.5 3.5 - 5.0 g/dL   AST 28 15  - 41 U/L   ALT 22 14 - 54 U/L   Alkaline Phosphatase 137 (H) 38 - 126 U/L   Total Bilirubin 0.9 0.3 - 1.2 mg/dL   GFR calc non Af Amer >60 >60 mL/min   GFR calc Af Amer >60 >60 mL/min   Anion gap 7 5 - 15  Urinalysis, Routine w reflex microscopic (not at [facility])  Result Value Ref Range   Color, Urine YELLOW YELLOW   APPearance CLEAR CLEAR   Specific Gravity, Urine 1.010 1.005 - 1.030   pH 7.0 5.0 - 8.0   Glucose, UA NEGATIVE NEGATIVE mg/dL   Hgb urine dipstick MODERATE (A) NEGATIVE   Bilirubin Urine NEGATIVE NEGATIVE   Ketones, ur NEGATIVE NEGATIVE mg/dL   Protein, ur NEGATIVE NEGATIVE mg/dL   Urobilinogen, UA 0.2 0.0 - 1.0 mg/dL   Nitrite NEGATIVE NEGATIVE   Leukocytes, UA NEGATIVE NEGATIVE  Troponin I  Result Value Ref Range   Troponin I <0.03 <0.031 ng/mL  Brain natriuretic peptide  Result Value Ref Range   B Natriuretic Peptide 604.0 (H) 0.0 - 100.0 pg/mL  Urine microscopic-add on  Result Value Ref Range   Squamous Epithelial / LPF RARE RARE   WBC, UA 0-2 <3 WBC/hpf   RBC / HPF 0-2 <3 RBC/hpf   Bacteria, UA RARE RARE   Dg Chest 2 View  06/11/2015   CLINICAL DATA:  Shortness of breath with lower extremity edema  EXAM: CHEST  2 VIEW  COMPARISON:  April 16, 2014  FINDINGS: There is interstitial edema with minimal right effusion and mild cardiomegaly. There is also pulmonary venous hypertension. There is no airspace consolidation. No adenopathy. No bone lesions.  IMPRESSION: Evidence of a degree of congestive heart failure. No airspace consolidation.   Electronically Signed   By: Lowella Grip III M.D.   On: 06/11/2015 13:26    1410:  Neb given for Sats 90% R/A on arrival. Sats now 98% on O2 2L N/C. BNP elevated with CHF on CXR; will dose IV lasix. Dx and testing d/w pt and family.  Questions answered.  Verb understanding, agreeable to admit.  T/C to [facility] Dr. Ree Kida, case discussed, including:  HPI, pertinent PM/SHx, VS/PE, dx testing, ED course and treatment:  Agreeable to  admit, requests to write temporary orders, obtain inpt medical bed to team [facility].   Francine Graven, DO 06/14/15 [phone]

## 2015-06-11 NOTE — ED Notes (Signed)
Per floor, pt room has been changed. Awaiting call back.

## 2015-06-12 ENCOUNTER — Inpatient Hospital Stay (HOSPITAL_COMMUNITY): Payer: Medicare Other

## 2015-06-12 DIAGNOSIS — I5031 Acute diastolic (congestive) heart failure: Secondary | ICD-10-CM

## 2015-06-12 DIAGNOSIS — I509 Heart failure, unspecified: Secondary | ICD-10-CM

## 2015-06-12 DIAGNOSIS — R0600 Dyspnea, unspecified: Secondary | ICD-10-CM

## 2015-06-12 LAB — CBC
HCT: 38.1 % (ref 36.0–46.0)
Hemoglobin: 12.2 g/dL (ref 12.0–15.0)
MCH: 31 pg (ref 26.0–34.0)
MCHC: 32 g/dL (ref 30.0–36.0)
MCV: 96.9 fL (ref 78.0–100.0)
Platelets: 216 10*3/uL (ref 150–400)
RBC: 3.93 MIL/uL (ref 3.87–5.11)
RDW: 15.6 % — ABNORMAL HIGH (ref 11.5–15.5)
WBC: 4.5 10*3/uL (ref 4.0–10.5)

## 2015-06-12 LAB — GLUCOSE, CAPILLARY
Glucose-Capillary: 112 mg/dL — ABNORMAL HIGH (ref 65–99)
Glucose-Capillary: 156 mg/dL — ABNORMAL HIGH (ref 65–99)
Glucose-Capillary: 96 mg/dL (ref 65–99)
Glucose-Capillary: 152 mg/dL — ABNORMAL HIGH (ref 65–99)

## 2015-06-12 LAB — BASIC METABOLIC PANEL
Anion gap: 7 (ref 5–15)
BUN: 22 mg/dL — ABNORMAL HIGH (ref 6–20)
CO2: 30 mmol/L (ref 22–32)
Calcium: 8.5 mg/dL — ABNORMAL LOW (ref 8.9–10.3)
Chloride: 108 mmol/L (ref 101–111)
Creatinine, Ser: 0.78 mg/dL (ref 0.44–1.00)
GFR calc Af Amer: >60 mL/min (ref >60)
GFR calc non Af Amer: >60 mL/min (ref >60)
Glucose, Bld: 128 mg/dL — ABNORMAL HIGH (ref 65–99)
Potassium: 3.5 mmol/L (ref 3.5–5.1)
Sodium: 145 mmol/L (ref 135–145)

## 2015-06-12 MED ORDER — FUROSEMIDE 10 MG/ML IJ SOLN: 40.0000 mg | Freq: Once | INTRAMUSCULAR | Status: DC

## 2015-06-12 MED ORDER — FUROSEMIDE 40 MG PO TABS
40.0000 mg | ORAL_TABLET | Freq: Two times a day (BID) | ORAL | Status: DC
  Administered 2015-06-12 – 2015-06-13 (×2): 40 mg via ORAL
  Filled 2015-06-12 (×2): qty 1

## 2015-06-12 MED ORDER — FUROSEMIDE 10 MG/ML IJ SOLN
40.0000 mg | Freq: Two times a day (BID) | INTRAMUSCULAR | Status: DC
Start: 2015-06-12 — End: 2015-06-12
  Filled 2015-06-12: qty 4

## 2015-06-12 MED ORDER — FUROSEMIDE 10 MG/ML IJ SOLN
20.0000 mg | Freq: Once | INTRAMUSCULAR | Status: AC
  Administered 2015-06-12: 20 mg via INTRAVENOUS
  Filled 2015-06-12: qty 2

## 2015-06-12 MED ORDER — FUROSEMIDE 10 MG/ML IJ SOLN: 40.0000 mg | Freq: Two times a day (BID) | INTRAMUSCULAR | Status: DC

## 2015-06-12 NOTE — Care Management Note (Signed)
Case Management Note  Patient Details  Name: [name] MRN: [id number] Date of Birth: 08/03/1931  Subjective/Objective:                  Pt admitted from home with CHF. Pt lives with her daughter and son and will return home at discharge. Pt has a cane and walker for home use. Pt is fairly independent with ADL's.  Action/Plan: Will continue to follow for discharge planning needs. ? Need for [facility] RN at discharge.  Expected Discharge Date:                  Expected Discharge Plan:  Home/Self Care  In-House Referral:  NA  Discharge planning Services  CM Consult  Post Acute Care Choice:  NA Choice offered to:  NA  DME Arranged:    DME Agency:     HH Arranged:    HH Agency:     Status of Service:  In process, will continue to follow  Medicare Important Message Given:    Date Medicare IM Given:    Medicare IM give by:    Date Additional Medicare IM Given:    Additional Medicare Important Message give by:     If discussed at [place] of Stay Meetings, dates discussed:    Additional Comments:  Joylene Draft, RN 06/12/2015, 11:23 AM

## 2015-06-12 NOTE — Progress Notes (Signed)
Family and pt requests to not have pt stuck anymore for IV attempts. Family requests to have IV Lasix changed to po. Made Dr Hilbert Bible aware of pt/family wishes. Dr Hilbert Bible ordered to switch lasix form IV to po. Will administer as soon as available. No further complaints from pt or family. Will continue to monitor pt frequently throughout night. Bed is in lowest position and call bell is within reach.

## 2015-06-12 NOTE — Progress Notes (Signed)
[facility] PROGRESS NOTE  [name] CXK:[id number] DOB: [birth year] DOA: 06/11/2015 PCP: [name], MD  Assessment/Plan: Acute on chronic diastolic CHF. -2-D echo with ejection fraction of 65-70% with indeterminate grade diastolic dysfunction. -She has had 600 mL of urine output overnight, remains with lower extremity edema. Will increase Lasix to double her dose of 40 mg IV twice a day. Chest x-ray on admission with pulmonary vascular congestion. -Continue to monitor daily weights, intake and output. -Continue statin, Plavix, ACE inhibitor, metoprolol. -To determine whether she will have oxygen prior to discharge.  Type 2 diabetes mellitus -Well controlled, continue current regimen.  COPD -Not exacerbated at present.  Hyperlipidemia -Continue atorvastatin.   Depression/anxiety -Continue Celexa, when necessary Xanax.  Code Status: Partial code Family Communication: Discussed with daughter [name] at bedside  Disposition Plan: Home when medically ready, anticipate 24-48 hours.   Consultants:  None   Antibiotics:  None   Subjective: Feels well, shortness of breath improved  Objective: Filed Vitals:   06/12/15 0801 06/12/15 0802 06/12/15 1024 06/12/15 1458  BP:   155/69 141/57  Pulse:   52 55  Temp:    97.6 F (36.4 C)  TempSrc:    Oral  Resp:    18  Height:      Weight:      SpO2: 88% 93%  98%    Intake/Output Summary (Last 24 hours) at 06/12/15 1726 Last data filed at 06/12/15 1300  Gross per 24 hour  Intake    400 ml  Output    900 ml  Net   -500 ml   Filed Weights   06/11/15 1149 06/11/15 1755  Weight: 76.658 kg (169 lb) 69.673 kg (153 lb 9.6 oz)    Exam:   General:  Alert, awake, oriented 3, no current distress  Cardiovascular: Regular rate and rhythm, no murmurs, rubs or gallops  Respiratory: Clear to auscultation bilaterally  Abdomen: Soft, nontender, nondistended, positive bowel sounds  Extremities: 2+ pitting edema  bilaterally   Neurologic:  Nonfocal  Data Reviewed: Basic Metabolic Panel:  Recent Labs Lab 06/11/15 1238 06/12/15 0607  NA 140 145  K 3.2* 3.5  CL 106 108  CO2 27 30  GLUCOSE 124* 128*  BUN 18 22*  CREATININE 0.81 0.78  CALCIUM 8.6* 8.5*   Liver Function Tests:  Recent Labs Lab 06/11/15 1238  AST 28  ALT 22  ALKPHOS 137*  BILITOT 0.9  PROT 6.9  ALBUMIN 3.5   No results for input(s): LIPASE, AMYLASE in the last 168 hours. No results for input(s): AMMONIA in the last 168 hours. CBC:  Recent Labs Lab 06/11/15 1238 06/12/15 0607  WBC 4.7 4.5  NEUTROABS 2.3  --   HGB 12.6 12.2  HCT 38.1 38.1  MCV 95.7 96.9  PLT 238 216   Cardiac Enzymes:  Recent Labs Lab 06/11/15 1238  TROPONINI <0.03   BNP (last 3 results)  Recent Labs  06/11/15 1238  BNP 604.0*    ProBNP (last 3 results) No results for input(s): PROBNP in the last 8760 hours.  CBG:  Recent Labs Lab 06/11/15 1720 06/11/15 2143 06/12/15 0718 06/12/15 1123 06/12/15 1646  GLUCAP 106* 172* 112* 156* 96    No results found for this or any previous visit (from the past 240 hour(s)).   Studies: Dg Chest 2 View  06/11/2015   CLINICAL DATA:  Shortness of breath with lower extremity edema  EXAM: CHEST  2 VIEW  COMPARISON:  April 16, 2014  FINDINGS: There is interstitial edema with minimal right effusion and mild cardiomegaly. There is also pulmonary venous hypertension. There is no airspace consolidation. No adenopathy. No bone lesions.  IMPRESSION: Evidence of a degree of congestive heart failure. No airspace consolidation.   Electronically Signed   By: Lowella Grip III M.D.   On: 06/11/2015 13:26    Scheduled Meds: . antiseptic oral rinse  7 mL Mouth Rinse q12n4p  . atorvastatin  10 mg Oral q1800  . CEREFOLIN NAC  1 tablet Oral Daily  . cholecalciferol  1,000 Units Oral q morning - 10a  . citalopram  10 mg Oral Daily  . clopidogrel  75 mg Oral Q breakfast  . docusate sodium  100 mg  Oral BID  . enoxaparin (LOVENOX) injection  40 mg Subcutaneous Q24H  . feeding supplement (GLUCERNA SHAKE)  237 mL Oral TID BM  . furosemide  40 mg Intravenous BID  . insulin aspart  0-9 Units Subcutaneous TID WC  . insulin aspart  5 Units Subcutaneous TID AC  . insulin glargine  8 Units Subcutaneous QHS  . lisinopril  20 mg Oral Daily  . metoprolol  50 mg Oral BID  . mirtazapine  30 mg Oral QHS  . pantoprazole  40 mg Oral Daily  . potassium chloride SA  20 mEq Oral Daily  . sodium chloride  3 mL Intravenous Q12H  . ursodiol  300 mg Oral BID WC   Continuous Infusions:   Principal Problem:   Acute diastolic heart failure Active Problems:   Hyperlipidemia   Anxiety state   Depression   Essential hypertension   Dyspnea    Time spent: 35 minutes. Greater than 50% of this time was spent in direct contact with the patient coordinating care.    Lelon Frohlich  [facility] Pager [phone]  If 7PM-7AM, please contact night-coverage at [email or web address], password [facility] 06/12/2015, 5:26 PM  LOS: 1 day

## 2015-06-13 DIAGNOSIS — I10 Essential (primary) hypertension: Secondary | ICD-10-CM

## 2015-06-13 LAB — BASIC METABOLIC PANEL
Anion gap: 8 (ref 5–15)
BUN: 19 mg/dL (ref 6–20)
CO2: 31 mmol/L (ref 22–32)
Calcium: 8.9 mg/dL (ref 8.9–10.3)
Chloride: 105 mmol/L (ref 101–111)
Creatinine, Ser: 0.76 mg/dL (ref 0.44–1.00)
GFR calc Af Amer: >60 mL/min (ref >60)
GFR calc non Af Amer: >60 mL/min (ref >60)
Glucose, Bld: 149 mg/dL — ABNORMAL HIGH (ref 65–99)
Potassium: 3.5 mmol/L (ref 3.5–5.1)
Sodium: 144 mmol/L (ref 135–145)

## 2015-06-13 LAB — GLUCOSE, CAPILLARY
Glucose-Capillary: 144 mg/dL — ABNORMAL HIGH (ref 65–99)
Glucose-Capillary: 150 mg/dL — ABNORMAL HIGH (ref 65–99)

## 2015-06-13 NOTE — Care Management Note (Signed)
Case Management Note  Patient Details  Name: VALLEY KE MRN: [id number] Date of Birth: 1931-04-17  Subjective/Objective:                    Action/Plan:   Expected Discharge Date:                  Expected Discharge Plan:  Home/Self Care  In-House Referral:  NA  Discharge planning Services  CM Consult  Post Acute Care Choice:  NA Choice offered to:  NA  DME Arranged:    DME Agency:     HH Arranged:    [place] Agency:     Status of Service:  Completed, signed off  Medicare Important Message Given:    Date Medicare IM Given:    Medicare IM give by:    Date Additional Medicare IM Given:    Additional Medicare Important Message give by:     If discussed at [place] of Stay Meetings, dates discussed:    Additional Comments: Pt discharge home today. No CM needs noted. Christinia Gully [place], RN 06/13/2015, 12:43 PM

## 2015-06-13 NOTE — Progress Notes (Signed)
Discharge instructions given on medications,and follow up visits,patient,and family verbalized understanding.Vital signs stable. Accompanied by staff to an awaiting vehicle.

## 2015-06-13 NOTE — Discharge Summary (Signed)
Physician Discharge Summary  [name] JYN:[id number] DOB: Sep 18, 1931 DOA: 06/11/2015  PCP: [name], MD  Admit date: 06/11/2015 Discharge date: 06/13/2015  Time spent: 45 minutes  Recommendations for Outpatient Follow-up:  -Will be discharged home today. -Advised to follow-up with primary care provider in 2 weeks.   Discharge Diagnoses:  Principal Problem:   Acute diastolic heart failure Active Problems:   Hyperlipidemia   Anxiety state   Depression   Essential hypertension   Dyspnea   Discharge Condition: Stable and improved  Filed Weights   06/11/15 1149 06/11/15 1755 06/13/15 0504  Weight: 76.658 kg (169 lb) 69.673 kg (153 lb 9.6 oz) 68.221 kg (150 lb 6.4 oz)    History of present illness:  [name] is a 81 y.o. female  With a history of diastolic heart failure, diabetes mellitus, type II, hypertension, who presented to the emergency department with complaints of shortness of breath. It seems the patient has had shortness of breath with increasing lower extremity edema over the past several days. Per family, patient has gained approximately 3 pounds. She has been more short of breath with exertion. She denied any chest pain or abdominal pain upon admission. Patient denies any recent illness or travel. She denies any dietary changes. In the emergency department, patient was found to have an elevated BNP604, chest x-rays also consistent with CHF. Was given 40 of Lasix IV. [facility] called for admission.  Hospital Course:   Acute on chronic diastolic CHF -2-D echo with ejection fraction of 65-70% with indeterminate grade diastolic dysfunction. -Is 1.6 L negative since admission, 1000 mL negative overnight. -No longer has lower extremity edema and lungs are clear. -We'll discharge back on home dose of Lasix which is 40 mg once daily. -Continue statin, ACE inhibitor, metoprolol.  Type 2 diabetes -Well controlled, continue current regimen.   COPD -Compensated,  not exacerbated at present.  Hyperlipidemia -Continue atorvastatin.  Depression/anxiety -Continue Celexa and when necessary Xanax.   Procedures:  None    Consultations:  None  Discharge Instructions  Discharge Instructions    Diet - low sodium heart healthy    Complete by:  As directed      Increase activity slowly    Complete by:  As directed             Medication List    STOP taking these medications        naproxen 500 MG tablet  Commonly known as:  NAPROSYN      TAKE these medications        ALPRAZolam 0.25 MG tablet  Commonly known as:  XANAX  Take one tablet by mouth twice daily as needed for anxiety     atorvastatin 10 MG tablet  Commonly known as:  LIPITOR  Take 1 tablet (10 mg total) by mouth daily.     CEREFOLIN NAC 6-90.314-2-600 MG Tabs  Take 1 tablet by mouth daily.     cholecalciferol 1000 UNITS tablet  Commonly known as:  VITAMIN D  Take 1,000 Units by mouth every morning.     citalopram 10 MG tablet  Commonly known as:  CELEXA  Take 1 tablet (10 mg total) by mouth daily.     clopidogrel 75 MG tablet  Commonly known as:  PLAVIX  Take 1 tablet (75 mg total) by mouth daily with breakfast. Resume Plavix 12/20 if no further bleeding.     DSS 100 MG Caps  Take 100 mg by mouth 2 (two) times  daily.     feeding supplement (GLUCERNA SHAKE) Liqd  Take 237 mLs by mouth 3 (three) times daily between meals.     furosemide 40 MG tablet  Commonly known as:  LASIX  Take 1 tablet (40 mg total) by mouth daily.     insulin aspart 100 UNIT/ML injection  Commonly known as:  novoLOG  Inject 5 Units into the skin 3 (three) times daily before meals.     insulin glargine 100 UNIT/ML injection  Commonly known as:  LANTUS  Inject 8 Units into the skin at bedtime.     lisinopril 20 MG tablet  Commonly known as:  PRINIVIL,ZESTRIL  Take 20 mg by mouth daily.     metoprolol 50 MG tablet  Commonly known as:  LOPRESSOR  Take 1 tablet (50 mg total)  by mouth 2 (two) times daily.     mirtazapine 30 MG tablet  Commonly known as:  REMERON  Take 30 mg by mouth at bedtime.     pantoprazole 40 MG tablet  Commonly known as:  PROTONIX  Take 1 tablet (40 mg total) by mouth daily.     potassium chloride SA 20 MEQ tablet  Commonly known as:  K-DUR,KLOR-CON  Take 20 mEq by mouth daily.     tiotropium 18 MCG inhalation capsule  Commonly known as:  SPIRIVA  Place 18 mcg into inhaler and inhale daily as needed (shortness of breath).     ursodiol 500 MG tablet  Commonly known as:  URSO FORTE  Take 1 tablet (500 mg total) by mouth 2 (two) times daily with a meal.       Allergies  Allergen Reactions  . Aricept [Donepezil Hcl]     Hives, vomiting and headache  . Namenda [Memantine Hcl]     hives  . Codeine   . Latex   . Indocin [Indomethacin] Rash  . Penicillins Rash       Follow-up Information    Follow up with [name], MD. Schedule an appointment as soon as possible for a visit in 1 week.   Specialty:  Internal Medicine   Contact information:   [address] [place] [address] [phone]        The results of significant diagnostics from this hospitalization (including imaging, microbiology, ancillary and laboratory) are listed below for reference.    Significant Diagnostic Studies: Dg Chest 2 View  06/11/2015   CLINICAL DATA:  Shortness of breath with lower extremity edema  EXAM: CHEST  2 VIEW  COMPARISON:  April 16, 2014  FINDINGS: There is interstitial edema with minimal right effusion and mild cardiomegaly. There is also pulmonary venous hypertension. There is no airspace consolidation. No adenopathy. No bone lesions.  IMPRESSION: Evidence of a degree of congestive heart failure. No airspace consolidation.   Electronically Signed   By: Lowella Grip III M.D.   On: 06/11/2015 13:26    Microbiology: No results found for this or any previous visit (from the past 240 hour(s)).   Labs: Basic  Metabolic Panel:  Recent Labs Lab 06/11/15 1238 06/12/15 0607 06/13/15 0610  NA 140 145 144  K 3.2* 3.5 3.5  CL 106 108 105  CO2 _0 GLUCOSE 124* 128* 149*  BUN 18 22* 19  CREATININE 0.81 0.78 0.76  CALCIUM 8.6* 8.5* 8.9   Liver Function Tests:  Recent Labs Lab 06/11/15 1238  AST 28  ALT 22  ALKPHOS 137*  BILITOT 0.9  PROT 6.9  ALBUMIN 3.5   No results for input(s): LIPASE, AMYLASE in the last 168 hours. No results for input(s): AMMONIA in the last 168 hours. CBC:  Recent Labs Lab 06/11/15 1238 06/12/15 0607  WBC 4.7 4.5  NEUTROABS 2.3  --   HGB 12.6 12.2  HCT 38.1 38.1  MCV 95.7 96.9  PLT 238 216   Cardiac Enzymes:  Recent Labs Lab 06/11/15 1238  TROPONINI <0.03   BNP: BNP (last 3 results)  Recent Labs  06/11/15 1238  BNP 604.0*    ProBNP (last 3 results) No results for input(s): PROBNP in the last 8760 hours.  CBG:  Recent Labs Lab 06/12/15 1123 06/12/15 1646 06/12/15 2122 06/13/15 0734 06/13/15 1128  GLUCAP 156* 96 152* 144* 150*       Signed:  [facility] Pager: [phone] 06/13/2015, 2:54 PM

## 2015-06-13 NOTE — Care Management Important Message (Signed)
Important Message  Patient Details  Name: [name] MRN: [id number] Date of Birth: 09-04-1931   Medicare Important Message Given:  Yes-second notification given    Joylene Draft, RN 06/13/2015, 12:44 PM

## 2015-06-13 NOTE — Progress Notes (Signed)
Patient ambulated in hallway without difficulty,room air saturation 92 percent. No c/o pain or discomfort noted. Will continue to monitor patient.

## 2015-06-20 DIAGNOSIS — I10 Essential (primary) hypertension: Secondary | ICD-10-CM | POA: Diagnosis not present

## 2015-06-20 DIAGNOSIS — E119 Type 2 diabetes mellitus without complications: Secondary | ICD-10-CM | POA: Diagnosis not present

## 2015-06-20 DIAGNOSIS — E1165 Type 2 diabetes mellitus with hyperglycemia: Secondary | ICD-10-CM | POA: Diagnosis not present

## 2015-06-20 DIAGNOSIS — E538 Deficiency of other specified B group vitamins: Secondary | ICD-10-CM | POA: Diagnosis not present

## 2015-06-20 DIAGNOSIS — F419 Anxiety disorder, unspecified: Secondary | ICD-10-CM | POA: Diagnosis not present

## 2015-06-20 DIAGNOSIS — I5030 Unspecified diastolic (congestive) heart failure: Secondary | ICD-10-CM | POA: Diagnosis not present

## 2015-07-02 DIAGNOSIS — E538 Deficiency of other specified B group vitamins: Secondary | ICD-10-CM | POA: Diagnosis not present

## 2015-07-02 DIAGNOSIS — I10 Essential (primary) hypertension: Secondary | ICD-10-CM | POA: Diagnosis not present

## 2015-07-02 DIAGNOSIS — E119 Type 2 diabetes mellitus without complications: Secondary | ICD-10-CM | POA: Diagnosis not present

## 2015-07-03 DIAGNOSIS — E1151 Type 2 diabetes mellitus with diabetic peripheral angiopathy without gangrene: Secondary | ICD-10-CM | POA: Diagnosis not present

## 2015-07-03 DIAGNOSIS — L84 Corns and callosities: Secondary | ICD-10-CM | POA: Diagnosis not present

## 2015-07-03 DIAGNOSIS — L602 Onychogryphosis: Secondary | ICD-10-CM | POA: Diagnosis not present

## 2015-07-17 ENCOUNTER — Ambulatory Visit (INDEPENDENT_AMBULATORY_CARE_PROVIDER_SITE_OTHER): Payer: Medicare Other | Admitting: Gastroenterology

## 2015-07-17 ENCOUNTER — Encounter: Payer: Self-pay | Admitting: Gastroenterology

## 2015-07-17 VITALS — BP 131/73 | HR 65 | Temp 97.6°F | Ht 64.0 in | Wt 165.8 lb

## 2015-07-17 DIAGNOSIS — I2510 Atherosclerotic heart disease of native coronary artery without angina pectoris: Secondary | ICD-10-CM

## 2015-07-17 DIAGNOSIS — K7460 Unspecified cirrhosis of liver: Secondary | ICD-10-CM

## 2015-07-17 DIAGNOSIS — K743 Primary biliary cirrhosis: Secondary | ICD-10-CM

## 2015-07-17 NOTE — Progress Notes (Signed)
ON RECALL  °

## 2015-07-17 NOTE — Patient Instructions (Signed)
Complete labs and ultrasound.  FOLLOW UP IN FEB 2017. MERRY CHRISTMAS AND HAPPY NEW YEAR!  PLEASE CALL WITH QUESTIONS OR CONCERNS.

## 2015-07-17 NOTE — Assessment & Plan Note (Signed)
WELL COMPENSATED DISEASE. NO BRBPR OR MELENA.  COMPLETE PT/INR/AFP Q6 MOS AND RUQ U/S. CONTINUE URSO OUTPATIENT VISIT IN FEB 2017.  GREATER THAN 50% WAS SPENT IN COUNSELING & COORDINATION OF CARE WITH THE PATIENT: DISCUSSED BENEFITS, RISKS, AND MANAGEMENT OF PRIMARY BILIARY CIRRHOSIS. TOTAL ENCOUNTER TIME: 15 MINS.

## 2015-07-17 NOTE — Assessment & Plan Note (Signed)
WELL COMPENSATED DISEASE.  AFP Q6 MOS CONSIDER EGD

## 2015-07-17 NOTE — Progress Notes (Signed)
Subjective:    Patient ID: [name], female    DOB: 02-20-1931, 81 y.o.   MRN: [id number]  [name], MD  HPI FEELING GOOD. GAINING WEIGHT. LAST HAD FLUID PULLED OFF ABD IN JUL 2016. BMS: MOST DAYS WhILE USING METAMUCIL.  PT DENIES FEVER, CHILLS, HEMATOCHEZIA, HEMATEMESIS, nausea, vomiting, melena, diarrhea, CHEST PAIN, SHORTNESS OF BREATH,  CHANGE IN BOWEL IN HABITS, constipation, abdominal pain, problems swallowing, problems with sedation, heartburn or indigestion.   Past Medical History  Diagnosis Date  . History of GI bleed   . Parotid tumor   . History of breast cancer   . Type 2 diabetes mellitus   . Osteopenia   . Essential hypertension, benign   . Hyperlipidemia   . Depression   . Anxiety   . Chronic headaches   . Carotid artery disease     Bilateral ICA occlusion  . Orthostatic hypotension     Diabetic polyneuropathy/diabetic dysautonomia   . Coronary atherosclerosis of native coronary artery     Mild nonobstructive disease at cardiac catheterization May 2011  . PSVT (paroxysmal supraventricular tachycardia)   . Acute diastolic congestive heart failure 03/21/2014  . Fracture of femoral neck, right, closed 03/28/2014  . Acute delirium 06/23/2013  . Syncope and collapse 04/15/2010    Qualifier: Diagnosis of  By: Angelena Form, MD, Harrell Gave    . Pressure ulcer, heel, right, unstageable 04/17/2014  . UPPER GASTROINTESTINAL HEMORRHAGE 03/21/2007    Qualifier: Diagnosis of  By: Jonna Munro MD, Roderic Scarce    . Chronic liver disease     ?cirrhosis on 10/2014 CT. H/O elevated alkphos and +AMA on URSO.    Past Surgical History  Procedure Laterality Date  . Mastectomy  1994    Left breast -related to breast cancer  . Vesicovaginal fistula closure w/ tah  1979  . Cholecystectomy  2008  . Hemorrhoid surgery  1960s  . Laparoscopic appendectomy N/A 04/13/2013    Procedure: APPENDECTOMY LAPAROSCOPIC;  Surgeon: Donato Heinz, MD;  Location: [facility];  Service: General;  Laterality:  N/A;  . Colonoscopy  2010    Dr. Oneida Alar: single tubular adenoma, one hyperplastic polyp. Next TCS 2020 health permitting.  . Hip arthroplasty Right 03/28/2014    Procedure: ARTHROPLASTY BIPOLAR HIP;  Surgeon: Carole Civil, MD;  Location: [facility];  Service: Orthopedics;  Laterality: Right;   Allergies  Allergen Reactions  . Aricept [Donepezil Hcl]     Hives, vomiting and headache  . Namenda [Memantine Hcl]     hives  . Codeine   . Latex   . Indocin [Indomethacin] Rash  . Penicillins Rash   Current Outpatient Prescriptions  Medication Sig Dispense Refill  . ALPRAZolam (XANAX) 0.25 MG tablet Take one tablet by mouth twice daily as needed for anxiety (Patient taking differently: Take 0.25 mg by mouth 2 (two) times daily as needed for anxiety. )    . atorvastatin (LIPITOR) 10 MG tablet Take 1 tablet (10 mg total) by mouth daily.    . cholecalciferol (VITAMIN D) 1000 UNITS tablet Take 1,000 Units by mouth every morning.     . citalopram (CELEXA) 10 MG tablet Take 1 tablet (10 mg total) by mouth daily.    . clopidogrel (PLAVIX) 75 MG tablet Take 1 tablet (75 mg total) by mouth daily with breakfast. Resume Plavix 12/20 if no further bleeding.    . docusate sodium 100 MG CAPS Take 100 mg by mouth 2 (two) times daily.    . feeding supplement, GLUCERNA  SHAKE, (GLUCERNA SHAKE) LIQD Take 237 mLs DAILY 1 DAILY   . furosemide (LASIX) 40 MG tablet Take 1 tablet (40 mg total) by mouth daily.    . insulin aspart (NOVOLOG) 100 UNIT/ML injection Inject 5 Units into the skin 3 (three) times daily before meals.     . insulin glargine (LANTUS) 100 UNIT/ML injection Inject 8 Units into the skin at bedtime.     Marland Kitchen lisinopril (PRINIVIL,ZESTRIL) 20 MG tablet Take 20 mg by mouth daily.    . Methylfol-Algae-B12-Acetylcyst (CEREFOLIN NAC) 6-90.314-2-600 MG TABS Take 1 tablet by mouth daily.     . metoprolol (LOPRESSOR) 50 MG tablet Take 1 tablet (50 mg total) by mouth 2 (two) times daily.    . mirtazapine  (REMERON) 30 MG tablet Take 30 mg by mouth at bedtime.    . pantoprazole (PROTONIX) 40 MG tablet Take 1 tablet (40 mg total) by mouth daily.    . potassium chloride SA (K-DUR,KLOR-CON) 20 MEQ tablet Take 20 mEq by mouth daily.     Marland Kitchen tiotropium (SPIRIVA) 18 MCG inhalation capsule Place 18 mcg into inhaler and inhale daily as needed (shortness of breath).    . ursodiol (URSO FORTE) 500 MG tablet Take 1 tablet (500 mg total) by mouth 2 (two) times daily with a meal.       Review of Systems PER HPI OTHERWISE ALL SYSTEMS ARE NEGATIVE.     Objective:   Physical Exam  Constitutional: She is oriented to person, place, and time. She appears well-developed and well-nourished. No distress.  HENT:  Head: Normocephalic and atraumatic.  Mouth/Throat: Oropharynx is clear and moist. No oropharyngeal exudate.  Eyes: Pupils are equal, round, and reactive to light. No scleral icterus.  Neck: Normal range of motion. Neck supple.  Cardiovascular: Normal rate, regular rhythm and normal heart sounds.   Pulmonary/Chest: Effort normal and breath sounds normal. No respiratory distress.  Abdominal: Soft. Bowel sounds are normal. She exhibits no distension. There is no tenderness.  Musculoskeletal: She exhibits edema (1+-2+).  Lymphadenopathy:    She has no cervical adenopathy.  Neurological: She is alert and oriented to person, place, and time.  NO  NEW FOCAL DEFICITS   Psychiatric:  FLAT AFFECT, NL MOOD   Vitals reviewed.         Assessment & Plan:

## 2015-07-18 NOTE — Progress Notes (Signed)
CC'ED TO PCP

## 2015-07-25 ENCOUNTER — Ambulatory Visit (HOSPITAL_COMMUNITY)
Admission: RE | Admit: 2015-07-25 | Discharge: 2015-07-25 | Disposition: A | Discharge status: Home / Self Care | Payer: Medicare Other | Source: Ambulatory Visit | Attending: Gastroenterology | Admitting: Gastroenterology

## 2015-07-25 DIAGNOSIS — Z853 Personal history of malignant neoplasm of breast: Secondary | ICD-10-CM | POA: Insufficient documentation

## 2015-07-25 DIAGNOSIS — Z794 Long term (current) use of insulin: Secondary | ICD-10-CM | POA: Insufficient documentation

## 2015-07-25 DIAGNOSIS — K743 Primary biliary cirrhosis: Secondary | ICD-10-CM | POA: Diagnosis not present

## 2015-07-25 DIAGNOSIS — K7460 Unspecified cirrhosis of liver: Secondary | ICD-10-CM | POA: Diagnosis not present

## 2015-07-25 DIAGNOSIS — E1165 Type 2 diabetes mellitus with hyperglycemia: Secondary | ICD-10-CM | POA: Diagnosis not present

## 2015-07-25 LAB — PROTIME-INR
INR: 1.04 (ref <1.50)
Prothrombin Time: 13.7 seconds (ref 11.6–15.2)

## 2015-07-26 LAB — AFP TUMOR MARKER: AFP-Tumor Marker: 9.9 ng/mL — ABNORMAL HIGH (ref <6.1)

## 2015-08-06 ENCOUNTER — Other Ambulatory Visit: Payer: Self-pay

## 2015-08-06 DIAGNOSIS — R069 Unspecified abnormalities of breathing: Secondary | ICD-10-CM | POA: Diagnosis not present

## 2015-08-06 DIAGNOSIS — R0602 Shortness of breath: Secondary | ICD-10-CM | POA: Diagnosis not present

## 2015-08-06 DIAGNOSIS — I509 Heart failure, unspecified: Secondary | ICD-10-CM | POA: Diagnosis not present

## 2015-08-06 DIAGNOSIS — K7460 Unspecified cirrhosis of liver: Secondary | ICD-10-CM

## 2015-08-06 DIAGNOSIS — E789 Disorder of lipoprotein metabolism, unspecified: Secondary | ICD-10-CM | POA: Diagnosis not present

## 2015-08-06 NOTE — Progress Notes (Signed)
LMOM to call.

## 2015-08-06 NOTE — Progress Notes (Signed)
Lab order on file for 01/2016.

## 2015-08-06 NOTE — Progress Notes (Signed)
PLEASE CALL PT. Her U/S SHOWS A NORMAL LIVER. HER LIVER TUMOR MARKER IS 9.9. HER INRI IS NORMAL. REPEAT U/S AND LABS(AFP, U/S) IN [facility] 2017.

## 2015-08-06 NOTE — Progress Notes (Signed)
Reminder in epic °

## 2015-08-06 NOTE — Progress Notes (Signed)
Pt's daughter, [name], called and was informed of results.

## 2015-08-08 ENCOUNTER — Ambulatory Visit (INDEPENDENT_AMBULATORY_CARE_PROVIDER_SITE_OTHER): Payer: Medicare Other | Admitting: Otolaryngology

## 2015-08-12 DIAGNOSIS — E119 Type 2 diabetes mellitus without complications: Secondary | ICD-10-CM | POA: Diagnosis not present

## 2015-08-12 DIAGNOSIS — D81818 Other biotin-dependent carboxylase deficiency: Secondary | ICD-10-CM | POA: Diagnosis not present

## 2015-08-12 DIAGNOSIS — I10 Essential (primary) hypertension: Secondary | ICD-10-CM | POA: Diagnosis not present

## 2015-09-18 DIAGNOSIS — E538 Deficiency of other specified B group vitamins: Secondary | ICD-10-CM | POA: Diagnosis not present

## 2015-09-18 DIAGNOSIS — Z23 Encounter for immunization: Secondary | ICD-10-CM | POA: Diagnosis not present

## 2015-09-18 DIAGNOSIS — E119 Type 2 diabetes mellitus without complications: Secondary | ICD-10-CM | POA: Diagnosis not present

## 2015-09-18 DIAGNOSIS — R609 Edema, unspecified: Secondary | ICD-10-CM | POA: Diagnosis not present

## 2015-09-18 DIAGNOSIS — I10 Essential (primary) hypertension: Secondary | ICD-10-CM | POA: Diagnosis not present

## 2015-09-26 DIAGNOSIS — L84 Corns and callosities: Secondary | ICD-10-CM | POA: Diagnosis not present

## 2015-09-26 DIAGNOSIS — E1351 Other specified diabetes mellitus with diabetic peripheral angiopathy without gangrene: Secondary | ICD-10-CM | POA: Diagnosis not present

## 2015-09-26 DIAGNOSIS — M19072 Primary osteoarthritis, left ankle and foot: Secondary | ICD-10-CM | POA: Diagnosis not present

## 2015-09-26 DIAGNOSIS — L602 Onychogryphosis: Secondary | ICD-10-CM | POA: Diagnosis not present

## 2015-10-03 DIAGNOSIS — I10 Essential (primary) hypertension: Secondary | ICD-10-CM | POA: Diagnosis not present

## 2015-10-07 DIAGNOSIS — R609 Edema, unspecified: Secondary | ICD-10-CM | POA: Diagnosis not present

## 2015-10-08 ENCOUNTER — Other Ambulatory Visit: Payer: Self-pay | Admitting: Gastroenterology

## 2015-11-01 ENCOUNTER — Other Ambulatory Visit: Payer: Self-pay | Admitting: Orthopedic Surgery

## 2015-11-28 DIAGNOSIS — R0600 Dyspnea, unspecified: Secondary | ICD-10-CM | POA: Diagnosis not present

## 2015-11-28 DIAGNOSIS — E119 Type 2 diabetes mellitus without complications: Secondary | ICD-10-CM | POA: Diagnosis not present

## 2015-11-28 DIAGNOSIS — I5030 Unspecified diastolic (congestive) heart failure: Secondary | ICD-10-CM | POA: Diagnosis not present

## 2015-11-28 DIAGNOSIS — I10 Essential (primary) hypertension: Secondary | ICD-10-CM | POA: Diagnosis not present

## 2015-12-02 ENCOUNTER — Encounter: Payer: Self-pay | Admitting: Gastroenterology

## 2015-12-02 ENCOUNTER — Ambulatory Visit (INDEPENDENT_AMBULATORY_CARE_PROVIDER_SITE_OTHER): Payer: Medicare Other | Admitting: Cardiology

## 2015-12-02 ENCOUNTER — Encounter: Payer: Self-pay | Admitting: Cardiology

## 2015-12-02 VITALS — BP 140/70 | HR 72 | Ht 64.0 in | Wt 165.0 lb

## 2015-12-02 DIAGNOSIS — I5032 Chronic diastolic (congestive) heart failure: Secondary | ICD-10-CM

## 2015-12-02 DIAGNOSIS — I25119 Atherosclerotic heart disease of native coronary artery with unspecified angina pectoris: Secondary | ICD-10-CM | POA: Diagnosis not present

## 2015-12-02 DIAGNOSIS — I209 Angina pectoris, unspecified: Secondary | ICD-10-CM | POA: Diagnosis not present

## 2015-12-02 DIAGNOSIS — I2510 Atherosclerotic heart disease of native coronary artery without angina pectoris: Secondary | ICD-10-CM | POA: Diagnosis not present

## 2015-12-02 LAB — COMPREHENSIVE METABOLIC PANEL
ALT: 12 U/L (ref 6–29)
AST: 22 U/L (ref 10–35)
Albumin: 3.9 g/dL (ref 3.6–5.1)
Alkaline Phosphatase: 130 U/L (ref 33–130)
BUN: 13 mg/dL (ref 7–25)
CO2: 30 mmol/L (ref 20–31)
Calcium: 9.4 mg/dL (ref 8.6–10.4)
Chloride: 103 mmol/L (ref 98–110)
Creat: 0.84 mg/dL (ref 0.60–0.88)
Glucose, Bld: 101 mg/dL — ABNORMAL HIGH (ref 65–99)
Potassium: 4.1 mmol/L (ref 3.5–5.3)
Sodium: 144 mmol/L (ref 135–146)
Total Bilirubin: 0.8 mg/dL (ref 0.2–1.2)
Total Protein: 7.1 g/dL (ref 6.1–8.1)

## 2015-12-02 LAB — CBC WITH DIFFERENTIAL/PLATELET
Basophils Absolute: 0.2 10*3/uL — ABNORMAL HIGH (ref 0.0–0.1)
Basophils Relative: 3 % — ABNORMAL HIGH (ref 0–1)
Eosinophils Absolute: 0.3 10*3/uL (ref 0.0–0.7)
Eosinophils Relative: 5 % (ref 0–5)
HCT: 44.2 % (ref 36.0–46.0)
Hemoglobin: 14.6 g/dL (ref 12.0–15.0)
Lymphocytes Relative: 47 % — ABNORMAL HIGH (ref 12–46)
Lymphs Abs: 2.4 10*3/uL (ref 0.7–4.0)
MCH: 32.1 pg (ref 26.0–34.0)
MCHC: 33 g/dL (ref 30.0–36.0)
MCV: 97.1 fL (ref 78.0–100.0)
MPV: 10.7 fL (ref 8.6–12.4)
Monocytes Absolute: 0.3 10*3/uL (ref 0.1–1.0)
Monocytes Relative: 5 % (ref 3–12)
Neutro Abs: 2 10*3/uL (ref 1.7–7.7)
Neutrophils Relative %: 40 % — ABNORMAL LOW (ref 43–77)
Platelets: 230 10*3/uL (ref 150–400)
RBC: 4.55 MIL/uL (ref 3.87–5.11)
RDW: 15.2 % (ref 11.5–15.5)
WBC: 5 10*3/uL (ref 4.0–10.5)

## 2015-12-02 LAB — MAGNESIUM: Magnesium: 1.8 mg/dL (ref 1.5–2.5)

## 2015-12-02 NOTE — Progress Notes (Signed)
12/02/2015 [name]   12-Oct-1931  [id number]  Primary Physician [name], MD Primary Cardiologist: [name]   Reason for Visit/CC: DOE + Fatigue  HPI:  The patient to 82 year old African [place] female with a history of mild CAD, DM, HTN, hyperlipidemia, PAD, and tobacco abuse (quit in 2013). In May 2011, she presented to [facility] with a syncopal episode.  Her troponin was elevated at 0.96 and her D-dimer was elevated at 0.93. CT angiogram chest without evidence of PE. EKG with T wave inversions anterior and inferior. Cardiac cath on 03/31/10 with mild disease in the LAD but no obstructive lesions. Echo with normal LV size and function with mild AI. Carotid dopplers normal at that time. Her CT chest did show centrilobular emphysema. Echo 02/17/14 with normal LV function, severe LVH, moderate TR. She was readmitted 04/18/14 with recurrent chest pain described as a smothering sensation and palpitations and was found to be in SVT. She converted with IV Cardizem. She also has severe bilateral carotid artery disease with total occlusions managed with statin therapy and antiplatelet management. He was last seen by [name] in April 2016 and was felt to be stable from a cardiac standpoint. He ordered for her to follow-up in 12 months. Since that time, she had an admission for acute diastolic CHF at [facility]. She was treated with lasix.  She presents back to clinic with a complaint of increased shortness of breath with exertion and fatigue/ decreased exercise tolerance. She is accompanied to clinic today by her daughter. Symptoms started one month ago and have progressively worsened. Her daughter has noticed that she appears more winded whenever she ambulates from room to room around her house. She denies any resting dyspnea. She also denies any chest discomfort. No syncope/ near syncope. She reports that she did have issues with dizziness, however this recently resolved after  her PCP, Dr. Maudie Mercury, discontinued her spirolactone. She does have mild LEE, R>L. Her right lower extremity has chronically been swollen since her hip replacement.   EKG shows sinus bradycardia with marked sinus arrhythmia with 1st degree AVB. HR is 52 bpm. Blocked PACs seen.     Current Outpatient Prescriptions  Medication Sig Dispense Refill  . ALPRAZolam (XANAX) 0.25 MG tablet Take one tablet by mouth twice daily as needed for anxiety (Patient taking differently: Take 0.25 mg by mouth 2 (two) times daily as needed for anxiety. ) 60 tablet 5  . atorvastatin (LIPITOR) 10 MG tablet Take 1 tablet (10 mg total) by mouth daily. 30 tablet 0  . cholecalciferol (VITAMIN D) 1000 UNITS tablet Take 1,000 Units by mouth every morning.     . citalopram (CELEXA) 10 MG tablet Take 1 tablet (10 mg total) by mouth daily.    . clopidogrel (PLAVIX) 75 MG tablet Take 1 tablet (75 mg total) by mouth daily with breakfast. Resume Plavix 12/20 if no further bleeding.    . docusate sodium 100 MG CAPS Take 100 mg by mouth 2 (two) times daily. 10 capsule 0  . feeding supplement, GLUCERNA SHAKE, (GLUCERNA SHAKE) LIQD Take 237 mLs by mouth 3 (three) times daily between meals. (Patient taking differently: Take 237 mLs by mouth 3 (three) times daily between meals. Only takes one daily) 30 Can 0  . furosemide (LASIX) 40 MG tablet Take 1 tablet (40 mg total) by mouth daily. 30 tablet 0  . insulin aspart (NOVOLOG) 100 UNIT/ML injection Inject 5 Units into the skin 3 (three) times daily  before meals.     . insulin glargine (LANTUS) 100 UNIT/ML injection Inject 8 Units into the skin at bedtime.     Marland Kitchen lisinopril (PRINIVIL,ZESTRIL) 20 MG tablet Take 20 mg by mouth daily.    . Methylfol-Algae-B12-Acetylcyst (CEREFOLIN NAC) 6-90.314-2-600 MG TABS Take 1 tablet by mouth daily.     . metoprolol (LOPRESSOR) 50 MG tablet Take 1 tablet (50 mg total) by mouth 2 (two) times daily.    . mirtazapine (REMERON) 30 MG tablet Take 30 mg by mouth at  bedtime.    . pantoprazole (PROTONIX) 40 MG tablet Take 1 tablet (40 mg total) by mouth daily. 30 tablet 3  . tiotropium (SPIRIVA) 18 MCG inhalation capsule Place 18 mcg into inhaler and inhale daily as needed (shortness of breath).    . URSO FORTE 500 MG tablet TAKE 1 TABLET TWICE A DAY WITH MEALS 180 tablet 3   No current facility-administered medications for this visit.    Allergies  Allergen Reactions  . Aricept [Donepezil Hcl]     Hives, vomiting and headache  . Namenda [Memantine Hcl]     hives  . Codeine   . Latex   . Indocin [Indomethacin] Rash  . Penicillins Rash    Social History   Social History  . Marital Status: Divorced    Spouse Name: N/A  . Number of Children: N/A  . Years of Education: N/A   Occupational History  . RETIRED FACTORY WORKER     [employer]   Social History Main Topics  . Smoking status: Former Smoker -- 0.50 packs/day for 40 years    Types: Cigarettes    Quit date: 03/30/2013  . Smokeless tobacco: Not on file     Comment: Quit since 03/2013  . Alcohol Use: No  . Drug Use: No  . Sexual Activity: No   Other Topics Concern  . 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 Started age 60.   Drug use -no   Alcohol use - no   Lives with son   Education - 12 th grade                    Review of Systems: General: negative for chills, fever, night sweats or weight changes.  Cardiovascular: negative for chest pain, dyspnea on exertion, edema, orthopnea, palpitations, paroxysmal nocturnal dyspnea or shortness of breath Dermatological: negative for rash Respiratory: negative for cough or wheezing Urologic: negative for hematuria Abdominal: negative for nausea, vomiting, diarrhea, bright red blood per rectum, melena, or hematemesis Neurologic:  negative for visual changes, syncope, or dizziness All other systems reviewed and are otherwise negative except as noted above.    Height _0  (1.626 m), weight 165 lb (74.844 kg).  General appearance: alert, cooperative and no distress Neck: no carotid bruit and no JVD Lungs: mild bibasilar honchi Heart: regular rate and rhythm and PACs Extremities: 1+ bilateral LEE, R>L Pulses: 2+ and symmetric Skin: warm and dry Neurologic: Grossly normal  EKG sinus bradycardia. 52 bpm. Blocked PACs.   ASSESSMENT AND PLAN:   1. DOE + Fatigue: check CBC to r/o anemia. BNP given h/o chronic diastolic CHF + evidence of mild LEE. We will have her temporarily increase lasix to BID x 3 days. Continue supplemental  K. Check CMP today and f/u BMP in 1 week. We will also check a TSH. EKG shows mild sinus bradycardia with rate in the 50s and blocked PAC. We may consider decreasing BB if initial w/u is negative. Given h/o mild LAD lesion on cath in 2011 (40% stenosis of the mid portion), we will order a NST to r/o ischemia/ progressive CAD as a potential etiology. Note, she recently had an echo 05/2015 which showed normal LVEF of 65-70%. Will consider repeating if stress test EF is abnormal.    PLAN  F/u in 1- 2 weeks after stress test.   Lyda Jester PA-C 12/02/2015 3:34 PM

## 2015-12-02 NOTE — Patient Instructions (Signed)
Medication Instructions:  Your physician has recommended you make the following change in your medication:  INCREASE Lasix to 27m twice daily for 3 days.  Labwork: Cmet, Cbc, Tsh, Bnp, Mag  Testing/Procedures: Your physician has requested that you have a lexiscan myoview. For further information please visit [email or web address] Please follow instruction sheet, as given. ([facility]   Follow-Up: Your physician recommends that you schedule a follow-up appointment [facility]   Any Other Special Instructions Will Be Listed Below (If Applicable).     If you need a refill on your cardiac medications before your next appointment, please call your pharmacy.

## 2015-12-03 ENCOUNTER — Other Ambulatory Visit: Payer: Self-pay

## 2015-12-03 DIAGNOSIS — R7989 Other specified abnormal findings of blood chemistry: Secondary | ICD-10-CM

## 2015-12-03 LAB — TSH: TSH: 8.882 u[IU]/mL — ABNORMAL HIGH (ref 0.350–4.500)

## 2015-12-03 LAB — BRAIN NATRIURETIC PEPTIDE: Brain Natriuretic Peptide: 597.7 pg/mL — ABNORMAL HIGH (ref 0.0–100.0)

## 2015-12-06 DIAGNOSIS — L602 Onychogryphosis: Secondary | ICD-10-CM | POA: Diagnosis not present

## 2015-12-06 DIAGNOSIS — I70293 Other atherosclerosis of native arteries of extremities, bilateral legs: Secondary | ICD-10-CM | POA: Diagnosis not present

## 2015-12-06 DIAGNOSIS — E1351 Other specified diabetes mellitus with diabetic peripheral angiopathy without gangrene: Secondary | ICD-10-CM | POA: Diagnosis not present

## 2015-12-07 ENCOUNTER — Other Ambulatory Visit: Payer: Self-pay | Admitting: Cardiology

## 2015-12-07 DIAGNOSIS — I25118 Atherosclerotic heart disease of native coronary artery with other forms of angina pectoris: Secondary | ICD-10-CM

## 2015-12-09 ENCOUNTER — Encounter (HOSPITAL_COMMUNITY)
Admission: RE | Admit: 2015-12-09 | Discharge: 2015-12-09 | Disposition: A | Discharge status: Home / Self Care | Payer: Medicare Other | Source: Ambulatory Visit | Attending: Cardiology | Admitting: Cardiology

## 2015-12-09 ENCOUNTER — Encounter (HOSPITAL_COMMUNITY): Payer: Self-pay

## 2015-12-09 ENCOUNTER — Inpatient Hospital Stay (HOSPITAL_COMMUNITY): Admission: RE | Admit: 2015-12-09 | Payer: Medicare Other | Source: Ambulatory Visit

## 2015-12-09 ENCOUNTER — Telehealth: Payer: Self-pay | Admitting: Cardiology

## 2015-12-09 ENCOUNTER — Other Ambulatory Visit: Payer: Self-pay

## 2015-12-09 DIAGNOSIS — R072 Precordial pain: Secondary | ICD-10-CM

## 2015-12-09 DIAGNOSIS — I2510 Atherosclerotic heart disease of native coronary artery without angina pectoris: Secondary | ICD-10-CM | POA: Insufficient documentation

## 2015-12-09 DIAGNOSIS — I25118 Atherosclerotic heart disease of native coronary artery with other forms of angina pectoris: Secondary | ICD-10-CM | POA: Diagnosis not present

## 2015-12-09 LAB — NM MYOCAR MULTI W/SPECT W/WALL MOTION / EF
LV dias vol: 44 mL
LV sys vol: 7 mL
Peak HR: 80 {beats}/min
RATE: 0.12
Rest HR: 64 {beats}/min
TID: 0.81

## 2015-12-09 MED ORDER — REGADENOSON 0.4 MG/5ML IV SOLN
INTRAVENOUS | Status: AC
  Administered 2015-12-09: 0.4 mg via INTRAVENOUS
  Filled 2015-12-09: qty 5

## 2015-12-09 MED ORDER — SODIUM CHLORIDE 0.9 % IJ SOLN
INTRAMUSCULAR | Status: AC
Start: 2015-12-09 — End: 2015-12-09
  Administered 2015-12-09: 10 mL via INTRAVENOUS
  Filled 2015-12-09: qty 3

## 2015-12-09 MED ORDER — TECHNETIUM TC 99M SESTAMIBI GENERIC - CARDIOLITE
10.0000 | Freq: Once | INTRAVENOUS | Status: AC | PRN
  Administered 2015-12-09: 10 via INTRAVENOUS

## 2015-12-09 MED ORDER — TECHNETIUM TC 99M SESTAMIBI - CARDIOLITE
30.0000 | Freq: Once | INTRAVENOUS | Status: AC | PRN
  Administered 2015-12-09: 11:00:00 30.9 via INTRAVENOUS

## 2015-12-09 NOTE — Telephone Encounter (Signed)
I spoke with [name]. They have received order and do not need anything else at this time.

## 2015-12-09 NOTE — Telephone Encounter (Signed)
Order faxed.

## 2015-12-09 NOTE — Telephone Encounter (Signed)
[name] is calling because she needs the order signed or fax to her . FAx # [phone]

## 2015-12-09 NOTE — Telephone Encounter (Signed)
New message     Forestine Na calling that order needs to be signed or fax back - nuclear med.

## 2015-12-10 NOTE — Addendum Note (Signed)
Addended by: Freada Bergeron on: 12/10/2015 05:19 PM   Modules accepted: Orders

## 2015-12-16 ENCOUNTER — Telehealth: Payer: Self-pay | Admitting: Cardiovascular Disease

## 2015-12-16 ENCOUNTER — Ambulatory Visit (INDEPENDENT_AMBULATORY_CARE_PROVIDER_SITE_OTHER): Payer: Medicare Other | Admitting: Physician Assistant

## 2015-12-16 ENCOUNTER — Other Ambulatory Visit (INDEPENDENT_AMBULATORY_CARE_PROVIDER_SITE_OTHER): Payer: Medicare Other | Admitting: *Deleted

## 2015-12-16 ENCOUNTER — Encounter: Payer: Self-pay | Admitting: Physician Assistant

## 2015-12-16 VITALS — BP 150/80 | HR 64 | Ht 64.0 in | Wt 167.0 lb

## 2015-12-16 DIAGNOSIS — E038 Other specified hypothyroidism: Secondary | ICD-10-CM

## 2015-12-16 DIAGNOSIS — R946 Abnormal results of thyroid function studies: Secondary | ICD-10-CM | POA: Diagnosis not present

## 2015-12-16 DIAGNOSIS — I5032 Chronic diastolic (congestive) heart failure: Secondary | ICD-10-CM

## 2015-12-16 DIAGNOSIS — I509 Heart failure, unspecified: Secondary | ICD-10-CM

## 2015-12-16 DIAGNOSIS — R7989 Other specified abnormal findings of blood chemistry: Secondary | ICD-10-CM

## 2015-12-16 DIAGNOSIS — E039 Hypothyroidism, unspecified: Secondary | ICD-10-CM | POA: Insufficient documentation

## 2015-12-16 DIAGNOSIS — I10 Essential (primary) hypertension: Secondary | ICD-10-CM | POA: Diagnosis not present

## 2015-12-16 DIAGNOSIS — I2510 Atherosclerotic heart disease of native coronary artery without angina pectoris: Secondary | ICD-10-CM

## 2015-12-16 LAB — T4, FREE: Free T4: 0.96 ng/dL (ref 0.80–1.80)

## 2015-12-16 LAB — T3, FREE: T3, Free: 2.2 pg/mL — ABNORMAL LOW (ref 2.3–4.2)

## 2015-12-16 MED ORDER — METOPROLOL TARTRATE 50 MG PO TABS: ORAL_TABLET | ORAL | Status: DC

## 2015-12-16 MED ORDER — HYDRALAZINE HCL 25 MG PO TABS: 25.0000 mg | ORAL_TABLET | Freq: Two times a day (BID) | ORAL | Status: DC

## 2015-12-16 MED ORDER — FUROSEMIDE 40 MG PO TABS: 60.0000 mg | ORAL_TABLET | Freq: Every day | ORAL | Status: DC

## 2015-12-16 NOTE — Assessment & Plan Note (Addendum)
This is a new diagnosis for the patient. Her TSH is 8.8. We will forward this to Dr. Maudie Mercury and have her treat this patient. Her main complaint is fatigue and weakness. Patient is having a free T4 and T3 drawn today.

## 2015-12-16 NOTE — Addendum Note (Signed)
Addended by: Eulis Foster on: 12/16/2015 02:12 PM   Modules accepted: Orders

## 2015-12-16 NOTE — Assessment & Plan Note (Signed)
Nuclear stress test 12/09/15 was a low risk study EF 83% no ischemia. Symptoms most likely due to diastolic CHF.

## 2015-12-16 NOTE — Assessment & Plan Note (Signed)
Blood pressure is still elevated. Add hydralazine 25 mg twice a day

## 2015-12-16 NOTE — Telephone Encounter (Signed)
Per daughter needs LAB results for today faxed to Dr. Maudie Mercury phone # [phone]  Fax#  [phone] and last visit office note as well.

## 2015-12-16 NOTE — Progress Notes (Signed)
Cardiology Office Note   Date:  12/16/2015   ID:  [name], DOB Sep 17, 1931, MRN [id number]  PCP:  [name], MD  Cardiologist:  [name]   Chief Complaint:    History of Present Illness: [name] is a 82 y.o. female who presents for one-week follow-up  She has a history of CAD, DM, HTN, HLD, PAD and tobacco abuse quit in 2013. In 2011 she had a syncopal episode. CT was negative for PE and EKG had T-wave inversion anterior and inferior. Cardiac cath showed mild disease in the LAD but no obstructive lesions. Echo with normal LV size and function and mild AI. Carotid Dopplers were normal at that time. CT of the chest that shows central lobular emphysema. She was readmitted in 2015 with recurrent chest pain and found to be in SVT converted with IV diltiazem. She has severe bilateral carotid artery disease with total occlusions managed with statin therapy and anti-platelet management. She was admitted to [facility] 05/8294 with diastolic heart failure treated with Lasix. Last 2-D echo 05/2015 showed moderate LVH EF 65-70% with abnormal diastolic function.   She saw Lesia Hausen PA 12/02/15 complaining of increased dyspnea on exertion and fatigue with decreased exercise tolerance. This has been going on for the past month. Her spironolactone had been stopped by her PCP because of dizziness. EKG showed sinus bradycardia in the 50s. Nuclear stress test was done on 12/09/15 was a low risk study EF 83% with small mild intensity basal inferior defect consistent with diaphragmatic attenuation. No reversible defects to indicate ischemia.  Patient felt better after 3 days of extra Lasix but feels like the fluid is building back up. BNP was 597, TSH 8.882. She is still very weak. She still has some leg swelling. Her weight is up 2 pounds his last office visit but is actually down a pound on home scales.  Past Medical History  Diagnosis Date  . History of GI bleed   . Parotid tumor    . History of breast cancer   . Type 2 diabetes mellitus ([place])   . Osteopenia   . Essential hypertension, benign   . Hyperlipidemia   . Depression   . Anxiety   . Chronic headaches   . Carotid artery disease (HCC)     Bilateral ICA occlusion  . Orthostatic hypotension     Diabetic polyneuropathy/diabetic dysautonomia   . Coronary atherosclerosis of native coronary artery     Mild nonobstructive disease at cardiac catheterization May 2011  . PSVT (paroxysmal supraventricular tachycardia) ([place])   . Acute diastolic congestive heart failure ([place]) 03/21/2014  . Fracture of femoral neck, right, closed 03/28/2014  . Acute delirium 06/23/2013  . Syncope and collapse 04/15/2010    Qualifier: Diagnosis of  By: Angelena Form, MD, Harrell Gave    . Pressure ulcer, heel, right, unstageable ([place]) 04/17/2014  . UPPER GASTROINTESTINAL HEMORRHAGE 03/21/2007    Qualifier: Diagnosis of  By: Jonna Munro MD, Roderic Scarce    . Chronic liver disease     ?cirrhosis on 10/2014 CT. H/O elevated alkphos and +AMA on URSO.    Past Surgical History  Procedure Laterality Date  . Mastectomy  1994    Left breast -related to breast cancer  . Vesicovaginal fistula closure w/ tah  1979  . Cholecystectomy  2008  . Hemorrhoid surgery  1960s  . Laparoscopic appendectomy N/A 04/13/2013    Procedure: APPENDECTOMY LAPAROSCOPIC;  Surgeon: Donato Heinz, MD;  Location: [facility];  Service: General;  Laterality: N/A;  . Colonoscopy  2010    Dr. Oneida Alar: single tubular adenoma, one hyperplastic polyp. Next TCS 2020 health permitting.  . Hip arthroplasty Right 03/28/2014    Procedure: ARTHROPLASTY BIPOLAR HIP;  Surgeon: Carole Civil, MD;  Location: [facility];  Service: Orthopedics;  Laterality: Right;     Current Outpatient Prescriptions  Medication Sig Dispense Refill  . ALPRAZolam (XANAX) 0.25 MG tablet Take one tablet by mouth twice daily as needed for anxiety (Patient taking differently: Take 0.25 mg by mouth 2 (two) times daily as  needed for anxiety. ) 60 tablet 5  . atorvastatin (LIPITOR) 10 MG tablet Take 1 tablet (10 mg total) by mouth daily. 30 tablet 0  . cholecalciferol (VITAMIN D) 1000 UNITS tablet Take 1,000 Units by mouth every morning.     . citalopram (CELEXA) 10 MG tablet Take 1 tablet (10 mg total) by mouth daily.    . clopidogrel (PLAVIX) 75 MG tablet Take 1 tablet (75 mg total) by mouth daily with breakfast. Resume Plavix 12/20 if no further bleeding.    . docusate sodium 100 MG CAPS Take 100 mg by mouth 2 (two) times daily. 10 capsule 0  . feeding supplement, GLUCERNA SHAKE, (GLUCERNA SHAKE) LIQD Take 237 mLs by mouth 3 (three) times daily between meals. (Patient taking differently: Take 237 mLs by mouth 3 (three) times daily between meals. Only takes one daily) 30 Can 0  . furosemide (LASIX) 40 MG tablet Take 1 tablet (40 mg total) by mouth daily. 30 tablet 0  . insulin aspart (NOVOLOG) 100 UNIT/ML injection Inject 5 Units into the skin 3 (three) times daily before meals.     . insulin glargine (LANTUS) 100 UNIT/ML injection Inject 8 Units into the skin at bedtime.     Marland Kitchen lisinopril (PRINIVIL,ZESTRIL) 20 MG tablet Take 20 mg by mouth daily.    . Methylfol-Algae-B12-Acetylcyst (CEREFOLIN NAC) 6-90.314-2-600 MG TABS Take 1 tablet by mouth daily.     . metoprolol (LOPRESSOR) 50 MG tablet Take 1 tablet (50 mg total) by mouth 2 (two) times daily.    . mirtazapine (REMERON) 30 MG tablet Take 30 mg by mouth at bedtime.    . pantoprazole (PROTONIX) 40 MG tablet Take 1 tablet (40 mg total) by mouth daily. 30 tablet 3  . tiotropium (SPIRIVA) 18 MCG inhalation capsule Place 18 mcg into inhaler and inhale daily as needed (shortness of breath).    . URSO FORTE 500 MG tablet TAKE 1 TABLET TWICE A DAY WITH MEALS 180 tablet 3   No current facility-administered medications for this visit.    Allergies:   Aricept; Namenda; Codeine; Latex; Spironolactone; Indocin; and Penicillins    Social History:  The patient  reports  that she quit smoking about 2 years ago. Her smoking use included Cigarettes. She has a 20 pack-year smoking history. She does not have any smokeless tobacco history on file. She reports that she does not drink alcohol or use illicit drugs.   Family History:  The patient's    family history includes Cancer in her father, mother, and sister; Cirrhosis in her brother; Heart failure in her mother; Hypertension in her mother and sister; Kidney cancer in her mother and sister; Lung cancer in her father. There is no history of Heart attack or Stroke.    ROS:  Please see the history of present illness.   Otherwise, review of systems are positive for excessive fatigue, cough, anxiety depression, chronic back pain and balance issues, easy  bruising.   All other systems are reviewed and negative.    PHYSICAL EXAM: VS:  BP 150/80 mmHg  Pulse 64  Ht _0  (1.626 m)  Wt 167 lb (75.751 kg)  BMI 28.65 kg/m2 , BMI Body mass index is 28.65 kg/(m^2). GEN: Well nourished, well developed, in no acute distress Neck: no JVD, HJR, carotid bruits, or masses Cardiac: RRR; positive S4, 2/6 systolic murmur at the left sternal border, no rubs, thrill or heave,  Respiratory:  clear to auscultation bilaterally, normal work of breathing GI: soft, nontender, nondistended, + BS MS: no deformity or atrophy Extremities: +1 edema bilaterally without cyanosis, clubbing, good distal pulses bilaterally.  Skin: warm and dry, no rash Neuro:  Strength and sensation are intact    EKG:  EKG is not ordered today.    Recent Labs: 06/11/2015: B Natriuretic Peptide 604.0* 12/02/2015: ALT 12; BUN 13; Creat 0.84; Hemoglobin 14.6; Magnesium 1.8; Platelets 230; Potassium 4.1; Sodium 144; TSH 8.882*    Lipid Panel    Component Value Date/Time   CHOL 189 02/17/2014 0648   TRIG 122 02/17/2014 0648   HDL 35* 02/17/2014 0648   CHOLHDL 5.4 02/17/2014 0648   VLDL 24 02/17/2014 0648   LDLCALC 130* 02/17/2014 0648      Wt Readings  from Last 3 Encounters:  12/16/15 167 lb (75.751 kg)  12/02/15 165 lb (74.844 kg)  07/17/15 165 lb 12.8 oz (75.206 kg)      Other studies Reviewed: Additional studies/ records that were reviewed today include and review of the records demonstrates: Nuclear stress test 12/09/15  No diagnostic ST segment abnormalities over baseline changes to indicate ischemia.  Small, mild intensity, basal inferior defect consistent with diaphragmatic attenuation. No reversible defects to indicate ischemia.  This is a low risk study.  Nuclear stress EF: 83%.     2-D echo 06/12/15  Study Conclusions  - Left ventricle: The cavity size was normal. Wall thickness was   increased in a pattern of moderate LVH. Systolic function was   vigorous. The estimated ejection fraction was in the range of 65%   to 70%. Abnormal diastolic function, indeterminate grade. - Aortic valve: Mildly calcified annulus. Trileaflet; mildly   thickened leaflets. Valve area (VTI): 2.13 cm^2. Valve area   (Vmax): 2.32 cm^2. - Mitral valve: Mildly calcified annulus. Mildly thickened leaflets   . - Left atrium: The atrium was mildly dilated. - Right ventricle: The cavity size was mildly dilated. - Right atrium: The atrium was mildly dilated. - Atrial septum: No defect or patent foramen ovale was identified. - Pulmonary arteries: Systolic pressure was moderately increased.   PA peak pressure: 42 mm Hg (S). - Technically adequate study.   ASSESSMENT AND PLAN: Diastolic CHF, chronic Patient continues to have trouble with diastolic heart failure. I will increase her Lasix to 60 mg daily. She is not on potassium but is on an ACE inhibitor. We will recheck her labs today and reevaluate if we need to add potassium. Add hydralazine 25 mg twice a day for better blood pressure control. Decrease metoprolol to 50 mg in the morning 20 5 in the evening because of bradycardia. Follow-up with Dr.McAlhany in 1-2 months.  Essential  hypertension Blood pressure is still elevated. Add hydralazine 25 mg twice a day  Hypothyroid This is a new diagnosis for the patient. Her TSH is 8.8. We will forward this to Dr. Maudie Mercury and have her treat this patient. Her main complaint is fatigue and weakness. Patient is having  a free T4 and T3 drawn today.  CORONARY ATHEROSCLEROSIS NATIVE CORONARY ARTERY Nuclear stress test 12/09/15 was a low risk study EF 83% no ischemia. Symptoms most likely due to diastolic CHF.     Signed, Ermalinda Barrios, PA-C  12/16/2015 2:41 PM    [facility] [address], [place], [place]  [address] Phone: [phone]; Fax: [phone]

## 2015-12-16 NOTE — Patient Instructions (Signed)
Medication Instructions:  Your physician has recommended you make the following change in your medication:  1) INCREASE Lasix to 62m daily 2) DECREASE Metoprolol to 547min the a.m.and 2554mn the p.m. 3) START Hydralazine 26m75mice daily   Labwork: Bmet, Bnp, T3, T4 today  Testing/Procedures: None ordered  Follow-Up: Your physician recommends that you schedule a follow-up appointment in: 1-2 months with Dr.Mcalhany   Any Other Special Instructions Will Be Listed Below (If Applicable).     If you need a refill on your cardiac medications before your next appointment, please call your pharmacy.

## 2015-12-16 NOTE — Assessment & Plan Note (Signed)
Patient continues to have trouble with diastolic heart failure. I will increase her Lasix to 60 mg daily. She is not on potassium but is on an ACE inhibitor. We will recheck her labs today and reevaluate if we need to add potassium. Add hydralazine 25 mg twice a day for better blood pressure control. Decrease metoprolol to 50 mg in the morning 20 5 in the evening because of bradycardia. Follow-up with Dr.McAlhany in 1-2 months.

## 2015-12-24 ENCOUNTER — Emergency Department (HOSPITAL_COMMUNITY): Payer: Medicare Other

## 2015-12-24 ENCOUNTER — Inpatient Hospital Stay (HOSPITAL_COMMUNITY)
Admission: EM | Admit: 2015-12-24 | Discharge: 2015-12-26 | DRG: 291 | Disposition: A | Discharge status: Home Health | Payer: Medicare Other | Attending: Internal Medicine | Admitting: Internal Medicine

## 2015-12-24 ENCOUNTER — Encounter (HOSPITAL_COMMUNITY): Payer: Self-pay

## 2015-12-24 DIAGNOSIS — E1142 Type 2 diabetes mellitus with diabetic polyneuropathy: Secondary | ICD-10-CM | POA: Diagnosis not present

## 2015-12-24 DIAGNOSIS — J189 Pneumonia, unspecified organism: Secondary | ICD-10-CM | POA: Diagnosis not present

## 2015-12-24 DIAGNOSIS — R0902 Hypoxemia: Secondary | ICD-10-CM | POA: Diagnosis present

## 2015-12-24 DIAGNOSIS — E039 Hypothyroidism, unspecified: Secondary | ICD-10-CM | POA: Diagnosis not present

## 2015-12-24 DIAGNOSIS — Z853 Personal history of malignant neoplasm of breast: Secondary | ICD-10-CM | POA: Diagnosis not present

## 2015-12-24 DIAGNOSIS — I2510 Atherosclerotic heart disease of native coronary artery without angina pectoris: Secondary | ICD-10-CM | POA: Diagnosis present

## 2015-12-24 DIAGNOSIS — Z9049 Acquired absence of other specified parts of digestive tract: Secondary | ICD-10-CM

## 2015-12-24 DIAGNOSIS — E119 Type 2 diabetes mellitus without complications: Secondary | ICD-10-CM

## 2015-12-24 DIAGNOSIS — I5033 Acute on chronic diastolic (congestive) heart failure: Secondary | ICD-10-CM | POA: Diagnosis not present

## 2015-12-24 DIAGNOSIS — Z7902 Long term (current) use of antithrombotics/antiplatelets: Secondary | ICD-10-CM

## 2015-12-24 DIAGNOSIS — I110 Hypertensive heart disease with heart failure: Secondary | ICD-10-CM | POA: Diagnosis not present

## 2015-12-24 DIAGNOSIS — Z801 Family history of malignant neoplasm of trachea, bronchus and lung: Secondary | ICD-10-CM

## 2015-12-24 DIAGNOSIS — Z87891 Personal history of nicotine dependence: Secondary | ICD-10-CM

## 2015-12-24 DIAGNOSIS — E785 Hyperlipidemia, unspecified: Secondary | ICD-10-CM | POA: Diagnosis present

## 2015-12-24 DIAGNOSIS — Z794 Long term (current) use of insulin: Secondary | ICD-10-CM | POA: Diagnosis not present

## 2015-12-24 DIAGNOSIS — Z8249 Family history of ischemic heart disease and other diseases of the circulatory system: Secondary | ICD-10-CM

## 2015-12-24 DIAGNOSIS — I5032 Chronic diastolic (congestive) heart failure: Secondary | ICD-10-CM | POA: Diagnosis not present

## 2015-12-24 DIAGNOSIS — R0602 Shortness of breath: Secondary | ICD-10-CM | POA: Diagnosis not present

## 2015-12-24 DIAGNOSIS — Z8051 Family history of malignant neoplasm of kidney: Secondary | ICD-10-CM

## 2015-12-24 DIAGNOSIS — Z90710 Acquired absence of both cervix and uterus: Secondary | ICD-10-CM

## 2015-12-24 DIAGNOSIS — E876 Hypokalemia: Secondary | ICD-10-CM | POA: Diagnosis present

## 2015-12-24 DIAGNOSIS — Z9012 Acquired absence of left breast and nipple: Secondary | ICD-10-CM

## 2015-12-24 LAB — URINALYSIS, ROUTINE W REFLEX MICROSCOPIC
Bilirubin Urine: NEGATIVE
Glucose, UA: NEGATIVE mg/dL
Hgb urine dipstick: NEGATIVE
Ketones, ur: NEGATIVE mg/dL
Leukocytes, UA: NEGATIVE
Nitrite: NEGATIVE
Protein, ur: NEGATIVE mg/dL
Specific Gravity, Urine: <1.005 — ABNORMAL LOW (ref 1.005–1.030)
pH: 6.5 (ref 5.0–8.0)

## 2015-12-24 LAB — BASIC METABOLIC PANEL
Anion gap: 10 (ref 5–15)
BUN: 21 mg/dL — ABNORMAL HIGH (ref 6–20)
CO2: 28 mmol/L (ref 22–32)
Calcium: 8.7 mg/dL — ABNORMAL LOW (ref 8.9–10.3)
Chloride: 100 mmol/L — ABNORMAL LOW (ref 101–111)
Creatinine, Ser: 0.9 mg/dL (ref 0.44–1.00)
GFR calc Af Amer: >60 mL/min (ref >60)
GFR calc non Af Amer: 58 mL/min — ABNORMAL LOW (ref >60)
Glucose, Bld: 146 mg/dL — ABNORMAL HIGH (ref 65–99)
Potassium: 3.4 mmol/L — ABNORMAL LOW (ref 3.5–5.1)
Sodium: 138 mmol/L (ref 135–145)

## 2015-12-24 LAB — CBC
HCT: 40.6 % (ref 36.0–46.0)
Hemoglobin: 13.5 g/dL (ref 12.0–15.0)
MCH: 32.2 pg (ref 26.0–34.0)
MCHC: 33.3 g/dL (ref 30.0–36.0)
MCV: 96.9 fL (ref 78.0–100.0)
Platelets: 213 10*3/uL (ref 150–400)
RBC: 4.19 MIL/uL (ref 3.87–5.11)
RDW: 15.3 % (ref 11.5–15.5)
WBC: 5.8 10*3/uL (ref 4.0–10.5)

## 2015-12-24 LAB — GLUCOSE, CAPILLARY: Glucose-Capillary: 241 mg/dL — ABNORMAL HIGH (ref 65–99)

## 2015-12-24 LAB — BRAIN NATRIURETIC PEPTIDE: B Natriuretic Peptide: 818 pg/mL — ABNORMAL HIGH (ref 0.0–100.0)

## 2015-12-24 LAB — TROPONIN I: Troponin I: 0.03 ng/mL (ref <0.031)

## 2015-12-24 MED ORDER — ACETAMINOPHEN 650 MG RE SUPP: 650.0000 mg | Freq: Four times a day (QID) | RECTAL | Status: DC | PRN

## 2015-12-24 MED ORDER — POTASSIUM CHLORIDE CRYS ER 20 MEQ PO TBCR: 40.0000 meq | EXTENDED_RELEASE_TABLET | ORAL | Status: DC

## 2015-12-24 MED ORDER — PANTOPRAZOLE SODIUM 40 MG PO TBEC: 40.0000 mg | DELAYED_RELEASE_TABLET | Freq: Every day | ORAL | Status: DC

## 2015-12-24 MED ORDER — FUROSEMIDE 10 MG/ML IJ SOLN
40.0000 mg | Freq: Once | INTRAMUSCULAR | Status: AC
  Administered 2015-12-24: 40 mg via INTRAVENOUS
  Filled 2015-12-24: qty 4

## 2015-12-24 MED ORDER — ACETAMINOPHEN 325 MG PO TABS
650.0000 mg | ORAL_TABLET | Freq: Four times a day (QID) | ORAL | Status: DC | PRN
Start: 2015-12-24 — End: 2015-12-27

## 2015-12-24 MED ORDER — DEXTROSE 5 % IV SOLN: 500.0000 mg | INTRAVENOUS | Status: DC

## 2015-12-24 MED ORDER — PANTOPRAZOLE SODIUM 40 MG PO TBEC
40.0000 mg | DELAYED_RELEASE_TABLET | Freq: Every day | ORAL | Status: DC
  Administered 2015-12-25 – 2015-12-26 (×2): 40 mg via ORAL
  Filled 2015-12-24: qty 1

## 2015-12-24 MED ORDER — INSULIN ASPART 100 UNIT/ML ~~LOC~~ SOLN
0.0000 [IU] | Freq: Three times a day (TID) | SUBCUTANEOUS | Status: DC
  Administered 2015-12-25: 5 [IU] via SUBCUTANEOUS
  Administered 2015-12-25: 3 [IU] via SUBCUTANEOUS
  Administered 2015-12-26: 2 [IU] via SUBCUTANEOUS
  Administered 2015-12-26: 1 [IU] via SUBCUTANEOUS

## 2015-12-24 MED ORDER — INSULIN GLARGINE 100 UNIT/ML ~~LOC~~ SOLN
8.0000 [IU] | Freq: Every day | SUBCUTANEOUS | Status: DC
  Administered 2015-12-24 – 2015-12-25 (×2): 8 [IU] via SUBCUTANEOUS
  Filled 2015-12-24 (×3): qty 0.08

## 2015-12-24 MED ORDER — CLOPIDOGREL BISULFATE 75 MG PO TABS
75.0000 mg | ORAL_TABLET | Freq: Every day | ORAL | Status: DC
  Administered 2015-12-25 – 2015-12-26 (×2): 75 mg via ORAL
  Filled 2015-12-24 (×2): qty 1

## 2015-12-24 MED ORDER — ENOXAPARIN SODIUM 40 MG/0.4ML ~~LOC~~ SOLN
40.0000 mg | SUBCUTANEOUS | Status: DC
  Administered 2015-12-24 – 2015-12-25 (×2): 40 mg via SUBCUTANEOUS
  Filled 2015-12-24 (×3): qty 0.4

## 2015-12-24 MED ORDER — POTASSIUM CHLORIDE CRYS ER 20 MEQ PO TBCR
40.0000 meq | EXTENDED_RELEASE_TABLET | ORAL | Status: AC
  Administered 2015-12-24 (×2): 40 meq via ORAL
  Filled 2015-12-24 (×2): qty 2

## 2015-12-24 MED ORDER — DEXTROSE 5 % IV SOLN
INTRAVENOUS | Status: AC
  Filled 2015-12-24: qty 500

## 2015-12-24 MED ORDER — LORATADINE 10 MG PO TABS
10.0000 mg | ORAL_TABLET | Freq: Every day | ORAL | Status: DC
  Administered 2015-12-24 – 2015-12-26 (×3): 10 mg via ORAL
  Filled 2015-12-24 (×2): qty 1

## 2015-12-24 MED ORDER — MIRTAZAPINE 30 MG PO TABS
30.0000 mg | ORAL_TABLET | Freq: Every day | ORAL | Status: DC
  Administered 2015-12-24 – 2015-12-25 (×2): 30 mg via ORAL
  Filled 2015-12-24 (×4): qty 1

## 2015-12-24 MED ORDER — CITALOPRAM HYDROBROMIDE 20 MG PO TABS
10.0000 mg | ORAL_TABLET | Freq: Every day | ORAL | Status: DC
  Administered 2015-12-24 – 2015-12-26 (×3): 10 mg via ORAL
  Filled 2015-12-24 (×2): qty 1
  Filled 2015-12-24: qty 0.5

## 2015-12-24 MED ORDER — LEVOFLOXACIN IN D5W 750 MG/150ML IV SOLN
750.0000 mg | Freq: Once | INTRAVENOUS | Status: AC
  Administered 2015-12-24: 750 mg via INTRAVENOUS
  Filled 2015-12-24: qty 150

## 2015-12-24 MED ORDER — ONDANSETRON HCL 4 MG/2ML IJ SOLN: 4.0000 mg | Freq: Four times a day (QID) | INTRAMUSCULAR | Status: DC | PRN

## 2015-12-24 MED ORDER — LISINOPRIL 10 MG PO TABS: 20.0000 mg | ORAL_TABLET | Freq: Every day | ORAL | Status: DC

## 2015-12-24 MED ORDER — METOPROLOL TARTRATE 50 MG PO TABS
50.0000 mg | ORAL_TABLET | Freq: Every day | ORAL | Status: DC
  Administered 2015-12-25 – 2015-12-26 (×2): 50 mg via ORAL
  Filled 2015-12-24: qty 1

## 2015-12-24 MED ORDER — ATORVASTATIN CALCIUM 10 MG PO TABS
10.0000 mg | ORAL_TABLET | Freq: Every day | ORAL | Status: DC
  Administered 2015-12-24 – 2015-12-26 (×3): 10 mg via ORAL
  Filled 2015-12-24 (×4): qty 1

## 2015-12-24 MED ORDER — ONDANSETRON HCL 4 MG PO TABS: 4.0000 mg | ORAL_TABLET | Freq: Four times a day (QID) | ORAL | Status: DC | PRN

## 2015-12-24 MED ORDER — METOPROLOL TARTRATE 50 MG PO TABS: 50.0000 mg | ORAL_TABLET | Freq: Two times a day (BID) | ORAL | Status: DC

## 2015-12-24 MED ORDER — FUROSEMIDE 10 MG/ML IJ SOLN
20.0000 mg | Freq: Two times a day (BID) | INTRAMUSCULAR | Status: DC
  Administered 2015-12-24 – 2015-12-26 (×5): 20 mg via INTRAVENOUS
  Filled 2015-12-24 (×5): qty 2

## 2015-12-24 MED ORDER — ALPRAZOLAM 0.25 MG PO TABS
0.2500 mg | ORAL_TABLET | Freq: Two times a day (BID) | ORAL | Status: DC | PRN
  Administered 2015-12-25 – 2015-12-26 (×2): 0.25 mg via ORAL
  Filled 2015-12-24 (×2): qty 1

## 2015-12-24 MED ORDER — DOCUSATE SODIUM 100 MG PO CAPS
100.0000 mg | ORAL_CAPSULE | Freq: Two times a day (BID) | ORAL | Status: DC
  Administered 2015-12-24 – 2015-12-26 (×4): 100 mg via ORAL
  Filled 2015-12-24 (×6): qty 1

## 2015-12-24 MED ORDER — ENOXAPARIN SODIUM 40 MG/0.4ML ~~LOC~~ SOLN: 40.0000 mg | SUBCUTANEOUS | Status: DC

## 2015-12-24 MED ORDER — METOPROLOL TARTRATE 25 MG PO TABS
25.0000 mg | ORAL_TABLET | Freq: Every day | ORAL | Status: DC
  Administered 2015-12-24 – 2015-12-25 (×2): 25 mg via ORAL
  Filled 2015-12-24 (×3): qty 1

## 2015-12-24 MED ORDER — HYDRALAZINE HCL 25 MG PO TABS
25.0000 mg | ORAL_TABLET | Freq: Two times a day (BID) | ORAL | Status: DC
  Administered 2015-12-24 – 2015-12-26 (×4): 25 mg via ORAL
  Filled 2015-12-24 (×6): qty 1

## 2015-12-24 MED ORDER — LISINOPRIL 10 MG PO TABS
20.0000 mg | ORAL_TABLET | Freq: Every day | ORAL | Status: DC
  Administered 2015-12-25 – 2015-12-26 (×2): 20 mg via ORAL
  Filled 2015-12-24: qty 2

## 2015-12-24 MED ORDER — METHYLPREDNISOLONE SODIUM SUCC 125 MG IJ SOLR
125.0000 mg | Freq: Once | INTRAMUSCULAR | Status: AC
  Administered 2015-12-24: 125 mg via INTRAVENOUS
  Filled 2015-12-24: qty 2

## 2015-12-24 MED ORDER — DEXTROSE 5 % IV SOLN
500.0000 mg | INTRAVENOUS | Status: DC
  Administered 2015-12-24 – 2015-12-25 (×2): 500 mg via INTRAVENOUS
  Filled 2015-12-24 (×3): qty 500

## 2015-12-24 MED ORDER — DEXTROSE 5 % IV SOLN
1.0000 g | INTRAVENOUS | Status: DC
  Administered 2015-12-24 – 2015-12-26 (×3): 1 g via INTRAVENOUS
  Filled 2015-12-24 (×3): qty 10

## 2015-12-24 NOTE — ED Provider Notes (Signed)
CSN: [id number]     Arrival date & time 12/24/15  1308 History   First MD Initiated Contact with Patient 12/24/15 1342     Chief Complaint  Patient presents with  . Shortness of Breath   HPI Patient presents to the emergency room for evaluation of shortness of breath. Patient has been having trouble with shortness of breath for at least several weeks. She has been evaluated by her primary doctor as well as her cardiologist. Most of the testing has been unremarkable according to the family with the exception of an elevated thyroid level. She is waiting to follow up with primary doctor on that. In the last several days however symptoms have gotten worse. Today at home they checked her oxygen level and noted it was in the 70s. Patient has been coughing more recently. She's also had some more swelling in her legs. Right leg is worse than the left but this is chronic ever since having surgery in the past.  She denies any chest pain. She denies any fevers. Past Medical History  Diagnosis Date  . History of GI bleed   . Parotid tumor   . History of breast cancer   . Type 2 diabetes mellitus ([place])   . Osteopenia   . Essential hypertension, benign   . Hyperlipidemia   . Depression   . Anxiety   . Chronic headaches   . Carotid artery disease (HCC)     Bilateral ICA occlusion  . Orthostatic hypotension     Diabetic polyneuropathy/diabetic dysautonomia   . Coronary atherosclerosis of native coronary artery     Mild nonobstructive disease at cardiac catheterization May 2011  . PSVT (paroxysmal supraventricular tachycardia) ([place])   . Acute diastolic congestive heart failure ([place]) 03/21/2014  . Fracture of femoral neck, right, closed 03/28/2014  . Acute delirium 06/23/2013  . Syncope and collapse 04/15/2010    Qualifier: Diagnosis of  By: Angelena Form, MD, Harrell Gave    . Pressure ulcer, heel, right, unstageable ([place]) 04/17/2014  . UPPER GASTROINTESTINAL HEMORRHAGE 03/21/2007    Qualifier: Diagnosis of  By:  Jonna Munro MD, Roderic Scarce    . Chronic liver disease     ?cirrhosis on 10/2014 CT. H/O elevated alkphos and +AMA on URSO.   Past Surgical History  Procedure Laterality Date  . Mastectomy  1994    Left breast -related to breast cancer  . Vesicovaginal fistula closure w/ tah  1979  . Cholecystectomy  2008  . Hemorrhoid surgery  1960s  . Laparoscopic appendectomy N/A 04/13/2013    Procedure: APPENDECTOMY LAPAROSCOPIC;  Surgeon: Donato Heinz, MD;  Location: [facility];  Service: General;  Laterality: N/A;  . Colonoscopy  2010    Dr. Oneida Alar: single tubular adenoma, one hyperplastic polyp. Next TCS 2020 health permitting.  . Hip arthroplasty Right 03/28/2014    Procedure: ARTHROPLASTY BIPOLAR HIP;  Surgeon: Carole Civil, MD;  Location: [facility];  Service: Orthopedics;  Laterality: Right;  . Abdominal hysterectomy     Family History  Problem Relation Age of Onset  . Heart failure Mother   . Cirrhosis Brother   . Lung cancer Father     Brain METS  . Kidney cancer Mother   . Kidney cancer Sister   . Heart attack Neg Hx   . Stroke Neg Hx   . Cancer Mother   . Cancer Father   . Cancer Sister   . Hypertension Sister   . Hypertension Mother    Social History  Substance Use Topics  .  Smoking status: Former Smoker -- 0.50 packs/day for 40 years    Types: Cigarettes    Quit date: 03/30/2013  . Smokeless tobacco: None     Comment: Quit since 03/2013  . Alcohol Use: No   OB History    No data available     Review of Systems  All other systems reviewed and are negative.     Allergies  Aricept; Namenda; Codeine; Latex; Spironolactone; Indocin; and Penicillins  Home Medications   Prior to Admission medications   Medication Sig Start Date End Date Taking? Authorizing Provider  ALPRAZolam Duanne Moron) 0.25 MG tablet Take one tablet by mouth twice daily as needed for anxiety Patient taking differently: Take 0.25 mg by mouth 2 (two) times daily as needed for anxiety.  04/23/14  Yes Tiffany  L Reed, DO  atorvastatin (LIPITOR) 10 MG tablet Take 1 tablet (10 mg total) by mouth daily. 02/20/14  Yes [name], MD  cholecalciferol (VITAMIN D) 1000 UNITS tablet Take 1,000 Units by mouth every morning.    Yes Historical Provider, MD  citalopram (CELEXA) 10 MG tablet Take 1 tablet (10 mg total) by mouth daily. 04/20/14  Yes Kathie Dike, MD  clopidogrel (PLAVIX) 75 MG tablet Take 1 tablet (75 mg total) by mouth daily with breakfast. Resume Plavix 12/20 if no further bleeding. 10/28/14  Yes Samuella Cota, MD  docusate sodium 100 MG CAPS Take 100 mg by mouth 2 (two) times daily. 03/30/14  Yes [name], MD  feeding supplement, GLUCERNA SHAKE, (GLUCERNA SHAKE) LIQD Take 237 mLs by mouth 3 (three) times daily between meals. Patient taking differently: Take 237 mLs by mouth 3 (three) times daily between meals. Only takes one daily 03/30/14  Yes [name], MD  furosemide (LASIX) 40 MG tablet Take 1.5 tablets (60 mg total) by mouth daily. 12/16/15  Yes Imogene Burn, PA-C  hydrALAZINE (APRESOLINE) 25 MG tablet Take 1 tablet (25 mg total) by mouth 2 (two) times daily. 12/16/15  Yes Imogene Burn, PA-C  insulin aspart (NOVOLOG) 100 UNIT/ML injection Inject 5 Units into the skin 3 (three) times daily before meals.    Yes Historical Provider, MD  insulin glargine (LANTUS) 100 UNIT/ML injection Inject 8 Units into the skin at bedtime.    Yes Historical Provider, MD  lisinopril (PRINIVIL,ZESTRIL) 20 MG tablet Take 20 mg by mouth daily.   Yes Historical Provider, MD  loratadine (CLARITIN) 10 MG tablet Take 10 mg by mouth daily.   Yes Historical Provider, MD  Methylfol-Algae-B12-Acetylcyst (CEREFOLIN NAC) 6-90.314-2-600 MG TABS Take 1 tablet by mouth daily.    Yes Historical Provider, MD  metoprolol (LOPRESSOR) 50 MG tablet Take 64m in the a.m.and 219min the pm 12/16/15  Yes MiImogene BurnPA-C  mirtazapine (REMERON) 30 MG tablet Take 30 mg by mouth at bedtime.   Yes Historical  Provider, MD  pantoprazole (PROTONIX) 40 MG tablet Take 1 tablet (40 mg total) by mouth daily. 08/31/14  Yes AnOrvil FeilNP  psyllium (METAMUCIL SMOOTH TEXTURE) 28 % packet Take 1 packet by mouth 2 (two) times daily.   Yes Historical Provider, MD  tiotropium (SPIRIVA) 18 MCG inhalation capsule Place 18 mcg into inhaler and inhale daily as needed (shortness of breath).   Yes Historical Provider, MD  URSO FORTE 500 MG tablet TAKE 1 TABLET TWICE A DAY WITH MEALS 10/08/15  Yes LeMahala MenghiniPA-C   BP 135/61 mmHg  Pulse 60  Temp(Src) 98.6 F (37 C) (Oral)  Resp 17  Ht _0  (1.626 m)  Wt 75.297 kg  BMI 28.48 kg/m2  SpO2 93% Physical Exam  Constitutional: No distress.  HENT:  Head: Normocephalic and atraumatic.  Right Ear: External ear normal.  Left Ear: External ear normal.  Eyes: Conjunctivae are normal. Right eye exhibits no discharge. Left eye exhibits no discharge. No scleral icterus.  Neck: Neck supple. No tracheal deviation present.  Cardiovascular: Normal rate, regular rhythm and intact distal pulses.   Pulmonary/Chest: Effort normal. No stridor. No respiratory distress. She has no wheezes. She has rales.  Rales bilaterally but greater on the right side  Abdominal: Soft. Bowel sounds are normal. She exhibits no distension. There is no tenderness. There is no rebound and no guarding.  Musculoskeletal: She exhibits edema. She exhibits no tenderness.  Edema bilateral lower extremities with the right greater than the left  Neurological: She is alert. She has normal strength. No cranial nerve deficit (no facial droop, extraocular movements intact, no slurred speech) or sensory deficit. She exhibits normal muscle tone. She displays no seizure activity. Coordination normal.  Skin: Skin is warm and dry. No rash noted.  Psychiatric: She has a normal mood and affect.  Nursing note and vitals reviewed.   ED Course  Procedures (including critical care time) Labs Review Labs Reviewed   BASIC METABOLIC PANEL - Abnormal; Notable for the following:    Potassium 3.4 (*)    Chloride 100 (*)    Glucose, Bld 146 (*)    BUN 21 (*)    Calcium 8.7 (*)    GFR calc non Af Amer 58 (*)    All other components within normal limits  BRAIN NATRIURETIC PEPTIDE - Abnormal; Notable for the following:    B Natriuretic Peptide 818.0 (*)    All other components within normal limits  URINALYSIS, ROUTINE W REFLEX MICROSCOPIC (NOT AT [facility]) - Abnormal; Notable for the following:    Specific Gravity, Urine <1.005 (*)    All other components within normal limits  CBC  TROPONIN I    Imaging Review Dg Chest Portable 1 View  12/24/2015  CLINICAL DATA:  Shortness of Breath EXAM: PORTABLE CHEST 1 VIEW COMPARISON:  November 28, 2015 FINDINGS: There is airspace consolidation in the right lower lobe with small right effusion. Left lung is clear. There is cardiomegaly with mild pulmonary venous hypertension. No adenopathy. There is atherosclerotic calcification in the aortic arch region. IMPRESSION: Airspace consolidation right lower lobe with small right effusion. Suspect pneumonia, although atypical pulmonary edema could present in this manner. Both entities may exist concurrently. There is underlying pulmonary vascular congestion with pulmonary venous hypertension and cardiomegaly. No edema or consolidation is appreciable on the left side. Electronically Signed   By: Lowella Grip III M.D.   On: 12/24/2015 13:47   I have personally reviewed and evaluated these images and lab results as part of my medical decision-making.   EKG Interpretation   Date/Time:  Tuesday December 24 2015 13:29:09 EST Ventricular Rate:  68 PR Interval:  295 QRS Duration: 90 QT Interval:  399 QTC Calculation: 424 R Axis:   -130 Text Interpretation:  Sinus rhythm Prolonged PR interval Probable left  atrial enlargement RVH with secondary repolarization abnrm Nonspecific T  abnormalities, lateral leads No significant  change since last tracing  Confirmed by Nikeria Kalman  MD-J, Ebonee Stober ([id number]) on 12/24/2015 3:18:24 PM      MDM   Final diagnoses:  CAP (community acquired pneumonia)    The patient's chest x-ray  shows airspace consolidation in the right lower lobe. I favor that this represents pneumonia rather than congestive heart failure.  She does have an increased BNP but she has had an elevated BNP in the past. Plan on starting her on IV antibiotics. Considering her hypoxia, I will consult the medical service for admission and further treatment.    Dorie Rank, MD 12/24/15 [phone]

## 2015-12-24 NOTE — ED Notes (Signed)
Pt's daughter reports pt has been having problems with ongoing SOB and saw cardiologist last week.   Today daughter reports pt sob, fatigued, 02 sat 74% on room air at home.   Pt also c/o pain in left hip x 2 weeks.

## 2015-12-24 NOTE — H&P (Addendum)
PCP:   [name], MD   Chief Complaint:  Shortness of breath  HPI:  82 year old female who  has a past medical history of History of GI bleed; Parotid tumor; History of breast cancer; Type 2 diabetes mellitus ([place]); Osteopenia; Essential hypertension, benign; Hyperlipidemia; Depression; Anxiety; Chronic headaches; Carotid artery disease ([place]); Orthostatic hypotension; Coronary atherosclerosis of native coronary artery; PSVT (paroxysmal supraventricular tachycardia) ([place]); Acute diastolic congestive heart failure ([place]) (03/21/2014); Fracture of femoral neck, right, closed (03/28/2014); Acute delirium (06/23/2013); Syncope and collapse (04/15/2010); Pressure ulcer, heel, right, unstageable ([place]) (04/17/2014); UPPER GASTROINTESTINAL HEMORRHAGE (03/21/2007); and Chronic liver disease. Today presents to the hospital with chief complaint of shortness of breath for past 2 weeks. Patient was recently seen by cardiology clinic where the Lasix was increased to 60 mg daily. Even then patient continues to have shortness of breath with exertion. Also continues of coughing up phlegm which ranges from white to yellow colored. Denies any fever or chills. Denies dysuria. No nausea vomiting or diarrhea. In the ED patient was found to be hypoxic with O2 sats less than 90% requiring oxygen by nasal cannula. Chest x-ray showed pulmonary edema versus pneumonia.    Allergies:   Allergies  Allergen Reactions  . Aricept [Donepezil Hcl]     Hives, vomiting and headache  . Namenda [Memantine Hcl]     hives  . Codeine   . Latex   . Spironolactone Other (See Comments)    Dizziness, balance problems  . Indocin [Indomethacin] Rash  . Penicillins Rash      Past Medical History  Diagnosis Date  . History of GI bleed   . Parotid tumor   . History of breast cancer   . Type 2 diabetes mellitus ([place])   . Osteopenia   . Essential hypertension, benign   . Hyperlipidemia   . Depression   . Anxiety   . Chronic headaches     . Carotid artery disease (HCC)     Bilateral ICA occlusion  . Orthostatic hypotension     Diabetic polyneuropathy/diabetic dysautonomia   . Coronary atherosclerosis of native coronary artery     Mild nonobstructive disease at cardiac catheterization May 2011  . PSVT (paroxysmal supraventricular tachycardia) ([place])   . Acute diastolic congestive heart failure ([place]) 03/21/2014  . Fracture of femoral neck, right, closed 03/28/2014  . Acute delirium 06/23/2013  . Syncope and collapse 04/15/2010    Qualifier: Diagnosis of  By: Angelena Form, MD, Harrell Gave    . Pressure ulcer, heel, right, unstageable ([place]) 04/17/2014  . UPPER GASTROINTESTINAL HEMORRHAGE 03/21/2007    Qualifier: Diagnosis of  By: Jonna Munro MD, Roderic Scarce    . Chronic liver disease     ?cirrhosis on 10/2014 CT. H/O elevated alkphos and +AMA on URSO.    Past Surgical History  Procedure Laterality Date  . Mastectomy  1994    Left breast -related to breast cancer  . Vesicovaginal fistula closure w/ tah  1979  . Cholecystectomy  2008  . Hemorrhoid surgery  1960s  . Laparoscopic appendectomy N/A 04/13/2013    Procedure: APPENDECTOMY LAPAROSCOPIC;  Surgeon: Donato Heinz, MD;  Location: [facility];  Service: General;  Laterality: N/A;  . Colonoscopy  2010    Dr. Oneida Alar: single tubular adenoma, one hyperplastic polyp. Next TCS 2020 health permitting.  . Hip arthroplasty Right 03/28/2014    Procedure: ARTHROPLASTY BIPOLAR HIP;  Surgeon: Carole Civil, MD;  Location: [facility];  Service: Orthopedics;  Laterality: Right;  . Abdominal hysterectomy  Prior to Admission medications   Medication Sig Start Date End Date Taking? Authorizing Provider  ALPRAZolam Duanne Moron) 0.25 MG tablet Take one tablet by mouth twice daily as needed for anxiety Patient taking differently: Take 0.25 mg by mouth 2 (two) times daily as needed for anxiety.  04/23/14  Yes Tiffany L Reed, DO  atorvastatin (LIPITOR) 10 MG tablet Take 1 tablet (10 mg total) by mouth daily.  02/20/14  Yes [name], MD  cholecalciferol (VITAMIN D) 1000 UNITS tablet Take 1,000 Units by mouth every morning.    Yes Historical Provider, MD  citalopram (CELEXA) 10 MG tablet Take 1 tablet (10 mg total) by mouth daily. 04/20/14  Yes Kathie Dike, MD  clopidogrel (PLAVIX) 75 MG tablet Take 1 tablet (75 mg total) by mouth daily with breakfast. Resume Plavix 12/20 if no further bleeding. 10/28/14  Yes Samuella Cota, MD  docusate sodium 100 MG CAPS Take 100 mg by mouth 2 (two) times daily. 03/30/14  Yes [name], MD  feeding supplement, GLUCERNA SHAKE, (GLUCERNA SHAKE) LIQD Take 237 mLs by mouth 3 (three) times daily between meals. Patient taking differently: Take 237 mLs by mouth 3 (three) times daily between meals. Only takes one daily 03/30/14  Yes [name], MD  furosemide (LASIX) 40 MG tablet Take 1.5 tablets (60 mg total) by mouth daily. 12/16/15  Yes Imogene Burn, PA-C  hydrALAZINE (APRESOLINE) 25 MG tablet Take 1 tablet (25 mg total) by mouth 2 (two) times daily. 12/16/15  Yes Imogene Burn, PA-C  insulin aspart (NOVOLOG) 100 UNIT/ML injection Inject 5 Units into the skin 3 (three) times daily before meals.    Yes Historical Provider, MD  insulin glargine (LANTUS) 100 UNIT/ML injection Inject 8 Units into the skin at bedtime.    Yes Historical Provider, MD  lisinopril (PRINIVIL,ZESTRIL) 20 MG tablet Take 20 mg by mouth daily.   Yes Historical Provider, MD  loratadine (CLARITIN) 10 MG tablet Take 10 mg by mouth daily.   Yes Historical Provider, MD  Methylfol-Algae-B12-Acetylcyst (CEREFOLIN NAC) 6-90.314-2-600 MG TABS Take 1 tablet by mouth daily.    Yes Historical Provider, MD  metoprolol (LOPRESSOR) 50 MG tablet Take 44m in the a.m.and 249min the pm 12/16/15  Yes MiImogene BurnPA-C  mirtazapine (REMERON) 30 MG tablet Take 30 mg by mouth at bedtime.   Yes Historical Provider, MD  pantoprazole (PROTONIX) 40 MG tablet Take 1 tablet (40 mg total) by mouth daily.  08/31/14  Yes AnOrvil FeilNP  psyllium (METAMUCIL SMOOTH TEXTURE) 28 % packet Take 1 packet by mouth 2 (two) times daily.   Yes Historical Provider, MD  tiotropium (SPIRIVA) 18 MCG inhalation capsule Place 18 mcg into inhaler and inhale daily as needed (shortness of breath).   Yes Historical Provider, MD  URSO FORTE 500 MG tablet TAKE 1 TABLET TWICE A DAY WITH MEALS 10/08/15  Yes LeMahala MenghiniPA-C    Social History:  reports that she quit smoking about 2 years ago. Her smoking use included Cigarettes. She has a 20 pack-year smoking history. She does not have any smokeless tobacco history on file. She reports that she does not drink alcohol or use illicit drugs.  Family History  Problem Relation Age of Onset  . Heart failure Mother   . Cirrhosis Brother   . Lung cancer Father     Brain METS  . Kidney cancer Mother   . Kidney cancer Sister   . Heart attack Neg Hx   .  Stroke Neg Hx   . Cancer Mother   . Cancer Father   . Cancer Sister   . Hypertension Sister   . Hypertension Mother     Danley Danker Weights   12/24/15 1317  Weight: 75.297 kg (166 lb)    All the positives are listed in BOLD  Review of Systems:  HEENT: Headache, blurred vision, runny nose, sore throat Neck: Hypothyroidism, hyperthyroidism,,lymphadenopathy Chest : Shortness of breath, history of COPD, Asthma Heart : Chest pain, history of coronary arterey disease GI:  Nausea, vomiting, diarrhea, constipation, GERD GU: Dysuria, urgency, frequency of urination, hematuria Neuro: Stroke, seizures, syncope Psych: Depression, anxiety, hallucinations   Physical Exam: Blood pressure 135/61, pulse 60, temperature 98.6 F (37 C), temperature source Oral, resp. rate 17, height _0  (1.626 m), weight 75.297 kg (166 lb), SpO2 93 %. Constitutional:   Patient is a well-developed and well-nourished female in no acute distress and cooperative with exam. Head: Normocephalic and atraumatic Mouth: Mucus membranes moist Eyes:  PERRL, EOMI, conjunctivae normal Neck: Supple, No Thyromegaly Cardiovascular: RRR, S1 normal, S2 normal Pulmonary/Chest: Bilateral crackles Abdominal: Soft. Non-tender, non-distended, bowel sounds are normal, no masses, organomegaly, or guarding present.  Neurological: A&O x3, Strength is normal and symmetric bilaterally, cranial nerve II-XII are grossly intact, no focal motor deficit, sensory intact to light touch bilaterally.  Extremities : No Cyanosis, Clubbing or Edema  Labs on Admission:  Basic Metabolic Panel:  Recent Labs Lab 12/24/15 1340  NA 138  K 3.4*  CL 100*  CO2 28  GLUCOSE 146*  BUN 21*  CREATININE 0.90  CALCIUM 8.7*   CBC:  Recent Labs Lab 12/24/15 1340  WBC 5.8  HGB 13.5  HCT 40.6  MCV 96.9  PLT 213   Cardiac Enzymes:  Recent Labs Lab 12/24/15 1340  TROPONINI 0.03    BNP (last 3 results)  Recent Labs  06/11/15 1238 12/24/15 1340  BNP 604.0* 818.0*     Radiological Exams on Admission: Dg Chest Portable 1 View  12/24/2015  CLINICAL DATA:  Shortness of Breath EXAM: PORTABLE CHEST 1 VIEW COMPARISON:  November 28, 2015 FINDINGS: There is airspace consolidation in the right lower lobe with small right effusion. Left lung is clear. There is cardiomegaly with mild pulmonary venous hypertension. No adenopathy. There is atherosclerotic calcification in the aortic arch region. IMPRESSION: Airspace consolidation right lower lobe with small right effusion. Suspect pneumonia, although atypical pulmonary edema could present in this manner. Both entities may exist concurrently. There is underlying pulmonary vascular congestion with pulmonary venous hypertension and cardiomegaly. No edema or consolidation is appreciable on the left side. Electronically Signed   By: Lowella Grip III M.D.   On: 12/24/2015 13:47    EKG: Independently reviewed. Normal sinus rhythm    Assessment/Plan Active Problems:   CAP (community acquired pneumonia)   Pulmonary edema    Diabetes mellitus   Acute on chronic diastolic CHF    Community-acquired pneumonia  will admit the patient and start Levaquin per pharmacy consultation. Check urinary strep pneumo antigen  Acute on chronic diastolic CHF Patient has history of chronic diastolic CHF, Lasix dose was recently increased by cardiology to 60 mg by mouth daily. She did not improve with these changes so we'll start on IV Lasix. Will give 1 dose of Lasix 40 mg IV in the ED and then start 20 mg IV every 12 hours from tonight. Dose of Lasix can be adjusted based on the renal function as well as patient's clinical response.  Diabetes mellitus We'll continue with Lantus. And start sliding scale insulin with NovoLog.  Hypokalemia   replace potassium and check BMP in a.m.  Hypertension Patient takes metoprolol, lisinopril at home. Continue with home medications.  History of CAD Continue with plavix, Lipitor.  DVT prophylaxis Lovenox  Code status: Partial code, DO NOT INTUBATE  Family discussion: Admission, patients condition and plan of care including tests being ordered have been discussed with the patient and her daughter at bedside who indicate understanding and agree with the plan and Code Status.   Time Spent on Admission: 60 min  [place] [facility] Pager: [phone] 12/24/2015, 3:40 PM  If 7PM-7AM, please contact night-coverage  [email or web address]  Password [email or web address]

## 2015-12-24 NOTE — ED Notes (Addendum)
Pt and daughter c/o red areas to right arm after starting levaquin IV. Levaquin stopped. Pt denies itching/sob or any other sx.  MD notified. 125 mg Solumedrol IV ordered.

## 2015-12-24 NOTE — Progress Notes (Signed)
ANTIBIOTIC CONSULT NOTE - INITIAL  Pharmacy Consult for Ceftriaxone Indication: pneumonia  Allergies  Allergen Reactions  . Aricept [Donepezil Hcl]     Hives, vomiting and headache  . Namenda [Memantine Hcl]     hives  . Codeine   . Latex   . Spironolactone Other (See Comments)    Dizziness, balance problems  . Indocin [Indomethacin] Rash  . Penicillins Rash    Patient Measurements: Height: _0  (162.6 cm) Weight: 166 lb (75.297 kg) IBW/kg (Calculated) : 54.7 Adjusted Body Weight:   Vital Signs: Temp: 98.1 F (36.7 C) (02/07 1607) Temp Source: Oral (02/07 1317) BP: 147/66 mmHg (02/07 1607) Pulse Rate: 65 (02/07 1607) Intake/Output from previous day:   Intake/Output from this shift:    Labs:  Recent Labs  12/24/15 1340  WBC 5.8  HGB 13.5  PLT 213  CREATININE 0.90   Estimated Creatinine Clearance: 47.9 mL/min (by C-G formula based on Cr of 0.9). No results for input(s): VANCOTROUGH, VANCOPEAK, VANCORANDOM, GENTTROUGH, GENTPEAK, GENTRANDOM, TOBRATROUGH, TOBRAPEAK, TOBRARND, AMIKACINPEAK, AMIKACINTROU, AMIKACIN in the last 72 hours.   Microbiology: No results found for this or any previous visit (from the past 720 hour(s)).  Medical History: Past Medical History  Diagnosis Date  . History of GI bleed   . Parotid tumor   . History of breast cancer   . Type 2 diabetes mellitus ([place])   . Osteopenia   . Essential hypertension, benign   . Hyperlipidemia   . Depression   . Anxiety   . Chronic headaches   . Carotid artery disease (HCC)     Bilateral ICA occlusion  . Orthostatic hypotension     Diabetic polyneuropathy/diabetic dysautonomia   . Coronary atherosclerosis of native coronary artery     Mild nonobstructive disease at cardiac catheterization May 2011  . PSVT (paroxysmal supraventricular tachycardia) ([place])   . Acute diastolic congestive heart failure ([place]) 03/21/2014  . Fracture of femoral neck, right, closed 03/28/2014  . Acute delirium 06/23/2013   . Syncope and collapse 04/15/2010    Qualifier: Diagnosis of  By: Angelena Form, MD, Harrell Gave    . Pressure ulcer, heel, right, unstageable ([place]) 04/17/2014  . UPPER GASTROINTESTINAL HEMORRHAGE 03/21/2007    Qualifier: Diagnosis of  By: Jonna Munro MD, Roderic Scarce    . Chronic liver disease     ?cirrhosis on 10/2014 CT. H/O elevated alkphos and +AMA on URSO.    Medications:  Scheduled:  . atorvastatin  10 mg Oral Daily  . citalopram  10 mg Oral Daily  . [START ON 12/25/2015] clopidogrel  75 mg Oral Q breakfast  . docusate sodium  100 mg Oral BID  . enoxaparin (LOVENOX) injection  40 mg Subcutaneous Q24H  . furosemide  20 mg Intravenous Q12H  . hydrALAZINE  25 mg Oral BID  . insulin aspart  0-9 Units Subcutaneous TID WC  . insulin glargine  8 Units Subcutaneous QHS  . lisinopril  20 mg Oral Daily  . loratadine  10 mg Oral Daily  . metoprolol  50 mg Oral BID  . mirtazapine  30 mg Oral QHS  . pantoprazole  40 mg Oral Daily  . potassium chloride  40 mEq Oral Q4H   Assessment: 82 yo ED patient, to be admitted with CAP Levaquin given in ED, patient complained red area, Levaquin stopped. No itching/SOB. Solu medrol given Pharmacy consult for Ceftriaxone  Goal of Therapy:  Eradicate infection  Plan:  Ceftriaxone 1 GM IV every 24 hours Labs per protocol LOT per MD  Abner Greenspan, Geetika Laborde Bennett 12/24/2015,4:41 PM

## 2015-12-24 NOTE — ED Notes (Signed)
Pt ambulated to restroom with 1 assist. O2 saturation decreased to 86% on ra. Pt returned to stretcher, placed on O2 at 2L. VSS.

## 2015-12-25 DIAGNOSIS — J189 Pneumonia, unspecified organism: Secondary | ICD-10-CM

## 2015-12-25 DIAGNOSIS — E119 Type 2 diabetes mellitus without complications: Secondary | ICD-10-CM | POA: Diagnosis not present

## 2015-12-25 DIAGNOSIS — I5032 Chronic diastolic (congestive) heart failure: Secondary | ICD-10-CM | POA: Diagnosis not present

## 2015-12-25 DIAGNOSIS — Z8051 Family history of malignant neoplasm of kidney: Secondary | ICD-10-CM | POA: Diagnosis not present

## 2015-12-25 DIAGNOSIS — E038 Other specified hypothyroidism: Secondary | ICD-10-CM

## 2015-12-25 DIAGNOSIS — E785 Hyperlipidemia, unspecified: Secondary | ICD-10-CM | POA: Diagnosis present

## 2015-12-25 DIAGNOSIS — Z7902 Long term (current) use of antithrombotics/antiplatelets: Secondary | ICD-10-CM | POA: Diagnosis not present

## 2015-12-25 DIAGNOSIS — I2510 Atherosclerotic heart disease of native coronary artery without angina pectoris: Secondary | ICD-10-CM | POA: Diagnosis present

## 2015-12-25 DIAGNOSIS — E1142 Type 2 diabetes mellitus with diabetic polyneuropathy: Secondary | ICD-10-CM | POA: Diagnosis present

## 2015-12-25 DIAGNOSIS — Z90710 Acquired absence of both cervix and uterus: Secondary | ICD-10-CM | POA: Diagnosis not present

## 2015-12-25 DIAGNOSIS — Z8249 Family history of ischemic heart disease and other diseases of the circulatory system: Secondary | ICD-10-CM | POA: Diagnosis not present

## 2015-12-25 DIAGNOSIS — E876 Hypokalemia: Secondary | ICD-10-CM | POA: Diagnosis present

## 2015-12-25 DIAGNOSIS — Z853 Personal history of malignant neoplasm of breast: Secondary | ICD-10-CM | POA: Diagnosis not present

## 2015-12-25 DIAGNOSIS — I110 Hypertensive heart disease with heart failure: Secondary | ICD-10-CM | POA: Diagnosis present

## 2015-12-25 DIAGNOSIS — Z794 Long term (current) use of insulin: Secondary | ICD-10-CM

## 2015-12-25 DIAGNOSIS — Z9012 Acquired absence of left breast and nipple: Secondary | ICD-10-CM | POA: Diagnosis not present

## 2015-12-25 DIAGNOSIS — Z9049 Acquired absence of other specified parts of digestive tract: Secondary | ICD-10-CM | POA: Diagnosis not present

## 2015-12-25 DIAGNOSIS — I5033 Acute on chronic diastolic (congestive) heart failure: Secondary | ICD-10-CM | POA: Diagnosis present

## 2015-12-25 DIAGNOSIS — Z87891 Personal history of nicotine dependence: Secondary | ICD-10-CM | POA: Diagnosis not present

## 2015-12-25 DIAGNOSIS — R0602 Shortness of breath: Secondary | ICD-10-CM | POA: Diagnosis present

## 2015-12-25 DIAGNOSIS — Z801 Family history of malignant neoplasm of trachea, bronchus and lung: Secondary | ICD-10-CM | POA: Diagnosis not present

## 2015-12-25 DIAGNOSIS — E039 Hypothyroidism, unspecified: Secondary | ICD-10-CM | POA: Diagnosis present

## 2015-12-25 DIAGNOSIS — R0902 Hypoxemia: Secondary | ICD-10-CM | POA: Diagnosis present

## 2015-12-25 LAB — COMPREHENSIVE METABOLIC PANEL
ALT: 16 U/L (ref 14–54)
AST: 28 U/L (ref 15–41)
Albumin: 2.9 g/dL — ABNORMAL LOW (ref 3.5–5.0)
Alkaline Phosphatase: 101 U/L (ref 38–126)
Anion gap: 7 (ref 5–15)
BUN: 24 mg/dL — ABNORMAL HIGH (ref 6–20)
CO2: 31 mmol/L (ref 22–32)
Calcium: 8.2 mg/dL — ABNORMAL LOW (ref 8.9–10.3)
Chloride: 102 mmol/L (ref 101–111)
Creatinine, Ser: 0.98 mg/dL (ref 0.44–1.00)
GFR calc Af Amer: >60 mL/min (ref >60)
GFR calc non Af Amer: 52 mL/min — ABNORMAL LOW (ref >60)
Glucose, Bld: 331 mg/dL — ABNORMAL HIGH (ref 65–99)
Potassium: 4.2 mmol/L (ref 3.5–5.1)
Sodium: 140 mmol/L (ref 135–145)
Total Bilirubin: 0.8 mg/dL (ref 0.3–1.2)
Total Protein: 5.8 g/dL — ABNORMAL LOW (ref 6.5–8.1)

## 2015-12-25 LAB — CBC
HCT: 39.2 % (ref 36.0–46.0)
Hemoglobin: 12.7 g/dL (ref 12.0–15.0)
MCH: 31.7 pg (ref 26.0–34.0)
MCHC: 32.4 g/dL (ref 30.0–36.0)
MCV: 97.8 fL (ref 78.0–100.0)
Platelets: 208 10*3/uL (ref 150–400)
RBC: 4.01 MIL/uL (ref 3.87–5.11)
RDW: 15.5 % (ref 11.5–15.5)
WBC: 6.7 10*3/uL (ref 4.0–10.5)

## 2015-12-25 LAB — HEMOGLOBIN A1C
Hgb A1c MFr Bld: 8 % — ABNORMAL HIGH (ref 4.8–5.6)
Mean Plasma Glucose: 183 mg/dL

## 2015-12-25 LAB — GLUCOSE, CAPILLARY
Glucose-Capillary: 282 mg/dL — ABNORMAL HIGH (ref 65–99)
Glucose-Capillary: 245 mg/dL — ABNORMAL HIGH (ref 65–99)
Glucose-Capillary: 117 mg/dL — ABNORMAL HIGH (ref 65–99)
Glucose-Capillary: 221 mg/dL — ABNORMAL HIGH (ref 65–99)

## 2015-12-25 LAB — STREP PNEUMONIAE URINARY ANTIGEN: Strep Pneumo Urinary Antigen: NEGATIVE

## 2015-12-25 MED ORDER — GUAIFENESIN ER 600 MG PO TB12
1200.0000 mg | ORAL_TABLET | Freq: Two times a day (BID) | ORAL | Status: DC
  Administered 2015-12-25 – 2015-12-26 (×2): 1200 mg via ORAL
  Filled 2015-12-25 (×2): qty 2

## 2015-12-25 NOTE — Care Management Obs Status (Signed)
[facility] NOTIFICATION   Patient Details  Name: [name] MRN: [id number] Date of Birth: 27-Apr-1931   Medicare Observation Status Notification Given:  Yes    Alvie Heidelberg, RN 12/25/2015, 9:17 AM

## 2015-12-25 NOTE — Care Management Note (Signed)
Case Management Note  Patient Details  Name: [name] MRN: [id number] Date of Birth: 03/04/1931  Subjective/Objective:              Spoke with patient for discharge planning. Alert and oriented from home with adult son and duaghter. Has cane and walker but stated that she does not use her walker at all.  Uses a cane when she goes out. Home is on one level. Denies difficulty with medications or transportation. No Cm needs identified.   Action/Plan:  Home with self/family care Expected Discharge Date:                  Expected Discharge Plan:  Home/Self Care  In-House Referral:     Discharge planning Services  CM Consult  Post Acute Care Choice:    Choice offered to:     DME Arranged:    DME Agency:     HH Arranged:    [place] Agency:     Status of Service:  Completed, signed off  Medicare Important Message Given:    Date Medicare IM Given:    Medicare IM give by:    Date Additional Medicare IM Given:    Additional Medicare Important Message give by:     If discussed at [place] of Stay Meetings, dates discussed:    Additional Comments:  Alvie Heidelberg, RN 12/25/2015, 10:18 AM

## 2015-12-25 NOTE — Progress Notes (Signed)
[facility] PROGRESS NOTE  ITZELL BENDAVID MPN:[id number] DOB: [birth year] DOA: 12/24/2015 PCP: [name], MD  Assessment/Plan: 1. CAP. Continue IV abx. Patient is afebrile with a normal WBC. BC are pending.  2. Acute on chronic diastolic CHF. BNP on admission was 818. Continue IV Lasix and monitor I&Os.  3. DM type 2, stable. Continue Lantus and SSI.  4. Hypokalemia, replaced.  5. Essential HTN, stable. Continue home meds. 6. Hx of CAD, continue Plavix and Lipitor.  Code Status: Partial, DNI DVT prophylaxis: Lovenox Family Communication: discussed with daughters at the bedside  Disposition Plan: discharge home once improved   Consultants:    Procedures:    Antibiotics:  Zithromax 2/7>>  Rocephin 2/7>>  HPI/Subjective: Coughing, feels shortness of breath is improving  Objective: Filed Vitals:   12/25/15 0844 12/25/15 1153  BP: 113/41 138/57  Pulse: 68 64  Temp:    Resp:      Intake/Output Summary (Last 24 hours) at 12/25/15 1301 Last data filed at 12/25/15 0848  Gross per 24 hour  Intake    360 ml  Output   1100 ml  Net   -740 ml   Filed Weights   12/24/15 1317 12/24/15 1755  Weight: 75.297 kg (166 lb) 74.118 kg (163 lb 6.4 oz)    Exam:  General: NAD, looks comfortable Cardiovascular: RRR, S1, S2  Respiratory: crackles at bases r>l Abdomen: soft, non tender, no distention , bowel sounds normal Musculoskeletal: 1+ edema b/l  Data Reviewed: Basic Metabolic Panel:  Recent Labs Lab 12/24/15 1340 12/25/15 0551  NA 138 140  K 3.4* 4.2  CL 100* 102  CO2 28 31  GLUCOSE 146* 331*  BUN 21* 24*  CREATININE 0.90 0.98  CALCIUM 8.7* 8.2*   Liver Function Tests:  Recent Labs Lab 12/25/15 0551  AST 28  ALT 16  ALKPHOS 101  BILITOT 0.8  PROT 5.8*  ALBUMIN 2.9*    CBC:  Recent Labs Lab 12/24/15 1340 12/25/15 0551  WBC 5.8 6.7  HGB 13.5 12.7  HCT 40.6 39.2  MCV 96.9 97.8  PLT 213 208   Cardiac Enzymes:  Recent Labs Lab  12/24/15 1340  TROPONINI 0.03   BNP (last 3 results)  Recent Labs  06/11/15 1238 12/24/15 1340  BNP 604.0* 818.0*     CBG:  Recent Labs Lab 12/24/15 2218 12/25/15 0741 12/25/15 1110  GLUCAP 241* 282* 245*    Recent Results (from the past 240 hour(s))  Culture, blood (Routine X 2) w Reflex to ID Panel     Status: None (Preliminary result)   Collection Time: 12/24/15  4:18 PM  Result Value Ref Range Status   Specimen Description BLOOD LEFT HAND  Final   Special Requests BOTTLES DRAWN AEROBIC AND ANAEROBIC 6CC EACH  Final   Culture NO GROWTH < 24 HOURS  Final   Report Status PENDING  Incomplete  Culture, blood (Routine X 2) w Reflex to ID Panel     Status: None (Preliminary result)   Collection Time: 12/24/15  7:14 PM  Result Value Ref Range Status   Specimen Description BLOOD RIGHT HAND  Final   Special Requests BOTTLES DRAWN AEROBIC ONLY 6CC  Final   Culture NO GROWTH < 24 HOURS  Final   Report Status PENDING  Incomplete     Studies: Dg Chest Portable 1 View  12/24/2015  CLINICAL DATA:  Shortness of Breath EXAM: PORTABLE CHEST 1 VIEW COMPARISON:  November 28, 2015 FINDINGS: There is airspace consolidation in the  right lower lobe with small right effusion. Left lung is clear. There is cardiomegaly with mild pulmonary venous hypertension. No adenopathy. There is atherosclerotic calcification in the aortic arch region. IMPRESSION: Airspace consolidation right lower lobe with small right effusion. Suspect pneumonia, although atypical pulmonary edema could present in this manner. Both entities may exist concurrently. There is underlying pulmonary vascular congestion with pulmonary venous hypertension and cardiomegaly. No edema or consolidation is appreciable on the left side. Electronically Signed   By: Lowella Grip III M.D.   On: 12/24/2015 13:47    Scheduled Meds: . atorvastatin  10 mg Oral Daily  . azithromycin  500 mg Intravenous Q24H  . cefTRIAXone (ROCEPHIN)  IV  1  g Intravenous Q24H  . citalopram  10 mg Oral Daily  . clopidogrel  75 mg Oral Q breakfast  . docusate sodium  100 mg Oral BID  . enoxaparin (LOVENOX) injection  40 mg Subcutaneous Q24H  . furosemide  20 mg Intravenous Q12H  . hydrALAZINE  25 mg Oral BID  . insulin aspart  0-9 Units Subcutaneous TID WC  . insulin glargine  8 Units Subcutaneous QHS  . lisinopril  20 mg Oral Daily  . loratadine  10 mg Oral Daily  . metoprolol tartrate  50 mg Oral Daily   And  . metoprolol tartrate  25 mg Oral QHS  . mirtazapine  30 mg Oral QHS  . pantoprazole  40 mg Oral Daily   Continuous Infusions:   Active Problems:   Diastolic CHF, chronic (HCC)   Type 2 diabetes mellitus without complication (HCC)   Hypothyroid   CAP (community acquired pneumonia)    Time spent: 7mnutes   JRaytheon MD  [facility] Pager [phone] If 7PM-7AM, please contact night-coverage at [email or web address], password [facility]2/06/2016, 1:01 PM

## 2015-12-26 LAB — GLUCOSE, CAPILLARY
Glucose-Capillary: 108 mg/dL — ABNORMAL HIGH (ref 65–99)
Glucose-Capillary: 149 mg/dL — ABNORMAL HIGH (ref 65–99)
Glucose-Capillary: 188 mg/dL — ABNORMAL HIGH (ref 65–99)

## 2015-12-26 MED ORDER — GUAIFENESIN ER 600 MG PO TB12: 600.0000 mg | ORAL_TABLET | Freq: Two times a day (BID) | ORAL | Status: DC

## 2015-12-26 MED ORDER — AZITHROMYCIN 250 MG PO TABS
500.0000 mg | ORAL_TABLET | Freq: Every day | ORAL | Status: DC
  Administered 2015-12-26: 500 mg via ORAL
  Filled 2015-12-26: qty 2

## 2015-12-26 NOTE — Progress Notes (Signed)
[facility] home care nurse [place] contacted about [name] discharge

## 2015-12-26 NOTE — Progress Notes (Signed)
SATURATION QUALIFICATIONS: (This note is used to comply with regulatory documentation for home oxygen)  Patient Saturations on Room Air at Rest = 90%   Patient Saturations on Room Air while Ambulating = 86%  Patient Saturations on 1 Liter of oxygen while Ambulating = 96%  Please briefly explain why patient needs home oxygen:

## 2015-12-26 NOTE — Care Management (Signed)
ED CM received call from [facility] at [facility] regarding patient needing home oxygen for discharge tonight. CM reviewed records and contacted patient and daughter Jerricka Carvey ([phone]) concerning the recommendations for oxygen patient is agreeable. Offered choice but, explained that at this time in the evening options are limited. Discussed [employer] as an after hour DME company. Patient amendable, Referral called in to [facility], confirms referral will deliver a tank to room prior to discharge tonight, and concentrator will be delivered home tonight. CM updated Patient and daughter teach back done verbalized understanding. No further CM needs identified.

## 2015-12-26 NOTE — Discharge Summary (Signed)
Physician Discharge Summary  [name] BUL:[id number] DOB: 1931/02/10 DOA: 12/24/2015  PCP: [name], MD  Admit date: 12/24/2015 Discharge date: 12/26/2015  Time spent: 35 minutes  Recommendations for Outpatient Follow-up:  1. Follow up with PCP within 1-2 weeks.  2.    Discharge Diagnoses:  Active Problems:   Diastolic CHF, chronic (HCC)   Type 2 diabetes mellitus without complication (HCC)   Hypothyroid   CAP (community acquired pneumonia)   Discharge Condition: Improved.   Diet recommendation: Heart healthy   Filed Weights   12/24/15 1317 12/24/15 1755  Weight: 75.297 kg (166 lb) 74.118 kg (163 lb 6.4 oz)    History of present illness:  7 yow with PMH of breat cancer, DM type 2, Essential HTN, HLD, Anxiety, CHF presented with complaints of shortness of breath. Patient reports recent cardiology visit, for which lasix was increased to 60 mg daily. Yet, this provided no relief for SOB complaints. In the ED patient was found to be hypoxic with sats less than 90% and CXR revealed pulmonary edemea vs. Pneumonia. She was admitted for further treatment.    Hospital Course:  Patient presented with worsening shortness of breath that was felt to be multifactorial due to pneumonia and chf exacerbation.She was treated with antibiotics and IV lasix with improvement in her breathing. She remained afebrile and with WBC wnl during hospitalization. BC with no growth to date. She is currently on 2 L of oxygen and breathing/ambulating comfortably. She does desaturate to 86% on ambulation on room air. She will be discharged with supplemental oxygen. She will continue on a course of levaquin at home to complete her abx course.  Acute on chronic diastolic CHF. BNP on admission was 818. On admission placed on IV Lasix for aggressive diuresis. Transitioned back to PO lasix on discharge. Monitored I&Os. LE edema has resolved.  DM type 2, stable. Continue Lantus and SSI.   Hypokalemia, replaced.    Essential HTN, stable. Continue home meds.  Hx of CAD, continue Plavix and Lipitor.  Procedures:  None  Consultations:  None   Discharge Exam: Filed Vitals:   12/26/15 0609 12/26/15 1441  BP: 114/50 165/68  Pulse: 58 67  Temp: 98.5 F (36.9 C) 98.9 F (37.2 C)  Resp: 15 16     General: NAD, looks comfortable  Cardiovascular: RRR, S1, S2   Respiratory: crackles at bases  Abdomen: soft, non tender, no distention , bowel sounds normal  Musculoskeletal: No edema b/l   Discharge Instructions   Discharge Instructions    Diet - low sodium heart healthy    Complete by:  As directed      Increase activity slowly    Complete by:  As directed           Current Discharge Medication List    START taking these medications   Details  guaiFENesin (MUCINEX) 600 MG 12 hr tablet Take 1 tablet (600 mg total) by mouth 2 (two) times daily. Qty: 30 tablet, Refills: 0      CONTINUE these medications which have NOT CHANGED   Details  ALPRAZolam (XANAX) 0.25 MG tablet Take one tablet by mouth twice daily as needed for anxiety Qty: 60 tablet, Refills: 5    atorvastatin (LIPITOR) 10 MG tablet Take 1 tablet (10 mg total) by mouth daily. Qty: 30 tablet, Refills: 0    cholecalciferol (VITAMIN D) 1000 UNITS tablet Take 1,000 Units by mouth every morning.     citalopram (CELEXA) 10 MG tablet Take 1  tablet (10 mg total) by mouth daily.    clopidogrel (PLAVIX) 75 MG tablet Take 1 tablet (75 mg total) by mouth daily with breakfast. Resume Plavix 12/20 if no further bleeding.    docusate sodium 100 MG CAPS Take 100 mg by mouth 2 (two) times daily. Qty: 10 capsule, Refills: 0    feeding supplement, GLUCERNA SHAKE, (GLUCERNA SHAKE) LIQD Take 237 mLs by mouth 3 (three) times daily between meals. Qty: 30 Can, Refills: 0    furosemide (LASIX) 40 MG tablet Take 1.5 tablets (60 mg total) by mouth daily. Qty: 45 tablet, Refills: 5    hydrALAZINE (APRESOLINE) 25 MG tablet Take 1  tablet (25 mg total) by mouth 2 (two) times daily. Qty: 60 tablet, Refills: 5    insulin aspart (NOVOLOG) 100 UNIT/ML injection Inject 5 Units into the skin 3 (three) times daily before meals.     insulin glargine (LANTUS) 100 UNIT/ML injection Inject 8 Units into the skin at bedtime.     lisinopril (PRINIVIL,ZESTRIL) 20 MG tablet Take 20 mg by mouth daily.    loratadine (CLARITIN) 10 MG tablet Take 10 mg by mouth daily.    Methylfol-Algae-B12-Acetylcyst (CEREFOLIN NAC) 6-90.314-2-600 MG TABS Take 1 tablet by mouth daily.     metoprolol (LOPRESSOR) 50 MG tablet Take 66m in the a.m.and 239min the pm    mirtazapine (REMERON) 30 MG tablet Take 30 mg by mouth at bedtime.    pantoprazole (PROTONIX) 40 MG tablet Take 1 tablet (40 mg total) by mouth daily. Qty: 30 tablet, Refills: 3    psyllium (METAMUCIL SMOOTH TEXTURE) 28 % packet Take 1 packet by mouth 2 (two) times daily.    tiotropium (SPIRIVA) 18 MCG inhalation capsule Place 18 mcg into inhaler and inhale daily as needed (shortness of breath).    URSO FORTE 500 MG tablet TAKE 1 TABLET TWICE A DAY WITH MEALS Qty: 180 tablet, Refills: 3       Allergies  Allergen Reactions  . Aricept [Donepezil Hcl]     Hives, vomiting and headache  . Namenda [Memantine Hcl]     hives  . Codeine   . Latex   . Spironolactone Other (See Comments)    Dizziness, balance problems  . Indocin [Indomethacin] Rash  . Penicillins Rash   Follow-up Information    Follow up with JaJani GravelMD. Schedule an appointment as soon as possible for a visit in 2 weeks.   Specialty:  Internal Medicine   Contact information:   [address] [phone]      The results of significant diagnostics from this hospitalization (including imaging, microbiology, ancillary and laboratory) are listed below for reference.    Significant Diagnostic Studies: Nm Myocar Multi W/spect W/wall Motion / Ef  12/09/2015   No  diagnostic ST segment abnormalities over baseline changes to indicate ischemia.  Small, mild intensity, basal inferior defect consistent with diaphragmatic attenuation. No reversible defects to indicate ischemia.  This is a low risk study.  Nuclear stress EF: 83%.    Dg Chest Portable 1 View  12/24/2015  CLINICAL DATA:  Shortness of Breath EXAM: PORTABLE CHEST 1 VIEW COMPARISON:  November 28, 2015 FINDINGS: There is airspace consolidation in the right lower lobe with small right effusion. Left lung is clear. There is cardiomegaly with mild pulmonary venous hypertension. No adenopathy. There is atherosclerotic calcification in the aortic arch region. IMPRESSION: Airspace consolidation right lower lobe with small right effusion. Suspect pneumonia, although  atypical pulmonary edema could present in this manner. Both entities may exist concurrently. There is underlying pulmonary vascular congestion with pulmonary venous hypertension and cardiomegaly. No edema or consolidation is appreciable on the left side. Electronically Signed   By: Lowella Grip III M.D.   On: 12/24/2015 13:47    Microbiology: Recent Results (from the past 240 hour(s))  Culture, blood (Routine X 2) w Reflex to ID Panel     Status: None (Preliminary result)   Collection Time: 12/24/15  4:18 PM  Result Value Ref Range Status   Specimen Description BLOOD LEFT HAND  Final   Special Requests BOTTLES DRAWN AEROBIC AND ANAEROBIC 6CC EACH  Final   Culture NO GROWTH 2 DAYS  Final   Report Status PENDING  Incomplete  Culture, blood (Routine X 2) w Reflex to ID Panel     Status: None (Preliminary result)   Collection Time: 12/24/15  7:14 PM  Result Value Ref Range Status   Specimen Description BLOOD RIGHT HAND  Final   Special Requests BOTTLES DRAWN AEROBIC ONLY 6CC  Final   Culture NO GROWTH 2 DAYS  Final   Report Status PENDING  Incomplete     Labs: Basic Metabolic Panel:  Recent Labs Lab 12/24/15 1340 12/25/15 0551  NA  138 140  K 3.4* 4.2  CL 100* 102  CO2 28 31  GLUCOSE 146* 331*  BUN 21* 24*  CREATININE 0.90 0.98  CALCIUM 8.7* 8.2*   Liver Function Tests:  Recent Labs Lab 12/25/15 0551  AST 28  ALT 16  ALKPHOS 101  BILITOT 0.8  PROT 5.8*  ALBUMIN 2.9*   CBC:  Recent Labs Lab 12/24/15 1340 12/25/15 0551  WBC 5.8 6.7  HGB 13.5 12.7  HCT 40.6 39.2  MCV 96.9 97.8  PLT 213 208   Cardiac Enzymes:  Recent Labs Lab 12/24/15 1340  TROPONINI 0.03   BNP: BNP (last 3 results)  Recent Labs  06/11/15 1238 12/24/15 1340  BNP 604.0* 818.0*   CBG:  Recent Labs Lab 12/25/15 1110 12/25/15 1638 12/25/15 2119 12/26/15 0753 12/26/15 1211  GLUCAP 245* 117* 221* 108* 149*       Signed:  Kathie Dike, MD  [facility] 12/26/2015, 5:11 PM    By signing my name below, I, Rennis Harding, attest that this documentation has been prepared under the direction and in the presence of Kathie Dike, MD. Electronically signed: Rennis Harding, Scribe. 12/26/2015   I, Dr. Kathie Dike, personally performed the services described in this documentaiton. All medical record entries made by the scribe were at my direction and in my presence. I have reviewed the chart and agree that the record reflects my personal performance and is accurate and complete  Kathie Dike, MD, 12/26/2015 5:11 PM

## 2015-12-26 NOTE — Progress Notes (Signed)
ANTIBIOTIC CONSULT NOTE - follow up  Pharmacy Consult for Ceftriaxone Indication: pneumonia  Allergies  Allergen Reactions  . Aricept [Donepezil Hcl]     Hives, vomiting and headache  . Namenda [Memantine Hcl]     hives  . Codeine   . Latex   . Spironolactone Other (See Comments)    Dizziness, balance problems  . Indocin [Indomethacin] Rash  . Penicillins Rash   Patient Measurements: Height: _0  (162.6 cm) Weight: 163 lb 6.4 oz (74.118 kg) IBW/kg (Calculated) : 54.7  Temp: 98.5 F (36.9 C) (02/09 0609) Temp Source: Oral (02/09 0609) BP: 114/50 mmHg (02/09 0609) Pulse Rate: 58 (02/09 0609) Intake/Output from previous day: 02/08 0701 - 02/09 0700 In: 840 [P.O.:840] Out: 800 [Urine:800] Intake/Output from this shift:    Labs:  Recent Labs  12/24/15 1340 12/25/15 0551  WBC 5.8 6.7  HGB 13.5 12.7  PLT 213 208  CREATININE 0.90 0.98   Estimated Creatinine Clearance: 43.7 mL/min (by C-G formula based on Cr of 0.98). No results for input(s): VANCOTROUGH, VANCOPEAK, VANCORANDOM, GENTTROUGH, GENTPEAK, GENTRANDOM, TOBRATROUGH, TOBRAPEAK, TOBRARND, AMIKACINPEAK, AMIKACINTROU, AMIKACIN in the last 72 hours.   Microbiology: Recent Results (from the past 720 hour(s))  Culture, blood (Routine X 2) w Reflex to ID Panel     Status: None (Preliminary result)   Collection Time: 12/24/15  4:18 PM  Result Value Ref Range Status   Specimen Description BLOOD LEFT HAND  Final   Special Requests BOTTLES DRAWN AEROBIC AND ANAEROBIC 6CC EACH  Final   Culture NO GROWTH 2 DAYS  Final   Report Status PENDING  Incomplete  Culture, blood (Routine X 2) w Reflex to ID Panel     Status: None (Preliminary result)   Collection Time: 12/24/15  7:14 PM  Result Value Ref Range Status   Specimen Description BLOOD RIGHT HAND  Final   Special Requests BOTTLES DRAWN AEROBIC ONLY 6CC  Final   Culture NO GROWTH 2 DAYS  Final   Report Status PENDING  Incomplete    Medical History: Past Medical  History  Diagnosis Date  . History of GI bleed   . Parotid tumor   . History of breast cancer   . Type 2 diabetes mellitus ([place])   . Osteopenia   . Essential hypertension, benign   . Hyperlipidemia   . Depression   . Anxiety   . Chronic headaches   . Carotid artery disease (HCC)     Bilateral ICA occlusion  . Orthostatic hypotension     Diabetic polyneuropathy/diabetic dysautonomia   . Coronary atherosclerosis of native coronary artery     Mild nonobstructive disease at cardiac catheterization May 2011  . PSVT (paroxysmal supraventricular tachycardia) ([place])   . Acute diastolic congestive heart failure ([place]) 03/21/2014  . Fracture of femoral neck, right, closed 03/28/2014  . Acute delirium 06/23/2013  . Syncope and collapse 04/15/2010    Qualifier: Diagnosis of  By: Angelena Form, MD, Harrell Gave    . Pressure ulcer, heel, right, unstageable ([place]) 04/17/2014  . UPPER GASTROINTESTINAL HEMORRHAGE 03/21/2007    Qualifier: Diagnosis of  By: Jonna Munro MD, Roderic Scarce    . Chronic liver disease     ?cirrhosis on 10/2014 CT. H/O elevated alkphos and +AMA on URSO.   Medications:  Scheduled:  . atorvastatin  10 mg Oral Daily  . azithromycin  500 mg Oral q1800  . cefTRIAXone (ROCEPHIN)  IV  1 g Intravenous Q24H  . citalopram  10 mg Oral Daily  . clopidogrel  75  mg Oral Q breakfast  . docusate sodium  100 mg Oral BID  . enoxaparin (LOVENOX) injection  40 mg Subcutaneous Q24H  . furosemide  20 mg Intravenous Q12H  . guaiFENesin  1,200 mg Oral BID  . hydrALAZINE  25 mg Oral BID  . insulin aspart  0-9 Units Subcutaneous TID WC  . insulin glargine  8 Units Subcutaneous QHS  . lisinopril  20 mg Oral Daily  . loratadine  10 mg Oral Daily  . metoprolol tartrate  50 mg Oral Daily   And  . metoprolol tartrate  25 mg Oral QHS  . mirtazapine  30 mg Oral QHS  . pantoprazole  40 mg Oral Daily   Assessment: 82 yo ED patient, to be admitted with CAP Anti-infectives    Start     Dose/Rate Route Frequency  Ordered Stop   12/26/15 1800  azithromycin (ZITHROMAX) tablet 500 mg     500 mg Oral Daily-1800 12/26/15 1118     12/25/15 1600  azithromycin (ZITHROMAX) 500 mg in dextrose 5 % 250 mL IVPB  Status:  Discontinued     500 mg 250 mL/hr over 60 Minutes Intravenous Every 24 hours 12/24/15 1747 12/24/15 1759   12/24/15 2000  azithromycin (ZITHROMAX) 500 mg in dextrose 5 % 250 mL IVPB  Status:  Discontinued     500 mg 250 mL/hr over 60 Minutes Intravenous Every 24 hours 12/24/15 1759 12/26/15 1118   12/24/15 1700  cefTRIAXone (ROCEPHIN) 1 g in dextrose 5 % 50 mL IVPB     1 g 100 mL/hr over 30 Minutes Intravenous Every 24 hours 12/24/15 1637     12/24/15 1645  azithromycin (ZITHROMAX) 500 mg in dextrose 5 % 250 mL IVPB  Status:  Discontinued     500 mg 250 mL/hr over 60 Minutes Intravenous Every 24 hours 12/24/15 1635 12/24/15 1746   12/24/15 1515  levofloxacin (LEVAQUIN) IVPB 750 mg     750 mg 100 mL/hr over 90 Minutes Intravenous  Once 12/24/15 1506 12/24/15 1623     Goal of Therapy:  Eradicate infection  Plan:  Continue Ceftriaxone 1 GM IV every 24 hours   PHARMACIST - PHYSICIAN COMMUNICATION CONCERNING: Antibiotic IV to Oral Route Change Policy  RECOMMENDATION: This patient is receiving ZITHROMAX by the intravenous route.  Based on criteria approved by the Pharmacy and Therapeutics Committee, the antibiotic(s) is/are being converted to the equivalent oral dose form(s).  DESCRIPTION: These criteria include:  Patient being treated for a respiratory tract infection, urinary tract infection, cellulitis or clostridium difficile associated diarrhea if on metronidazole  The patient is not neutropenic and does not exhibit a GI malabsorption state  The patient is eating (either orally or via tube) and/or has been taking other orally administered medications for a least 24 hours  The patient is improving clinically and has a Tmax < 100.5  If you have questions about this conversion,  please contact the Pharmacy Department  _0   [phone] )  [facility] _1   [phone] )  [facility] _2   [phone] )  [facility] _3   [phone] )  [facility] _4   [phone] )  [facility], Abryanna Musolino A 12/26/2015,11:18 AM

## 2015-12-26 NOTE — Progress Notes (Signed)
Central telemetry notified of patient being discharge, telemetry removed.  IV access removed.  Daughter present in room.

## 2015-12-26 NOTE — Consult Note (Signed)
   [facility] CM Inpatient Consult   12/26/2015  [name] 28-Apr-1931 [id number]  Spoke with patient and her daughter at the bedside regarding [facility] services. Patient does not want to participate with [facility] at this time. Patient given [facility] brochure and contact information for future reference.  Of note, [facility] Care Management services would not replace or interfere with any services that are arranged by inpatient case management or social work. For additional questions or referrals please contact:  Royetta Crochet. Laymond Purser, RN, BSN, [facility] Liaison [phone]

## 2015-12-28 DIAGNOSIS — J189 Pneumonia, unspecified organism: Secondary | ICD-10-CM | POA: Diagnosis not present

## 2015-12-28 DIAGNOSIS — I5033 Acute on chronic diastolic (congestive) heart failure: Secondary | ICD-10-CM | POA: Diagnosis not present

## 2015-12-28 DIAGNOSIS — E785 Hyperlipidemia, unspecified: Secondary | ICD-10-CM | POA: Diagnosis not present

## 2015-12-28 DIAGNOSIS — E119 Type 2 diabetes mellitus without complications: Secondary | ICD-10-CM | POA: Diagnosis not present

## 2015-12-28 DIAGNOSIS — Z9981 Dependence on supplemental oxygen: Secondary | ICD-10-CM | POA: Diagnosis not present

## 2015-12-28 DIAGNOSIS — F419 Anxiety disorder, unspecified: Secondary | ICD-10-CM | POA: Diagnosis not present

## 2015-12-28 DIAGNOSIS — E039 Hypothyroidism, unspecified: Secondary | ICD-10-CM | POA: Diagnosis not present

## 2015-12-28 DIAGNOSIS — Z794 Long term (current) use of insulin: Secondary | ICD-10-CM | POA: Diagnosis not present

## 2015-12-28 DIAGNOSIS — Z87891 Personal history of nicotine dependence: Secondary | ICD-10-CM | POA: Diagnosis not present

## 2015-12-29 LAB — CULTURE, BLOOD (ROUTINE X 2)
Culture: NO GROWTH
Culture: NO GROWTH

## 2015-12-30 ENCOUNTER — Other Ambulatory Visit: Payer: Self-pay

## 2015-12-30 ENCOUNTER — Telehealth: Payer: Self-pay | Admitting: Gastroenterology

## 2015-12-30 DIAGNOSIS — I5033 Acute on chronic diastolic (congestive) heart failure: Secondary | ICD-10-CM | POA: Diagnosis not present

## 2015-12-30 DIAGNOSIS — E785 Hyperlipidemia, unspecified: Secondary | ICD-10-CM | POA: Diagnosis not present

## 2015-12-30 DIAGNOSIS — K7460 Unspecified cirrhosis of liver: Secondary | ICD-10-CM

## 2015-12-30 DIAGNOSIS — E039 Hypothyroidism, unspecified: Secondary | ICD-10-CM | POA: Diagnosis not present

## 2015-12-30 DIAGNOSIS — F419 Anxiety disorder, unspecified: Secondary | ICD-10-CM | POA: Diagnosis not present

## 2015-12-30 DIAGNOSIS — J189 Pneumonia, unspecified organism: Secondary | ICD-10-CM | POA: Diagnosis not present

## 2015-12-30 DIAGNOSIS — E119 Type 2 diabetes mellitus without complications: Secondary | ICD-10-CM | POA: Diagnosis not present

## 2015-12-30 NOTE — Telephone Encounter (Signed)
Letter and labs mailed

## 2015-12-30 NOTE — Telephone Encounter (Signed)
PATIENT ON RECALL FOR REPEAT ULTASOUND AND LABS IN [facility]

## 2016-01-01 DIAGNOSIS — E119 Type 2 diabetes mellitus without complications: Secondary | ICD-10-CM | POA: Diagnosis not present

## 2016-01-01 DIAGNOSIS — J189 Pneumonia, unspecified organism: Secondary | ICD-10-CM | POA: Diagnosis not present

## 2016-01-01 DIAGNOSIS — F419 Anxiety disorder, unspecified: Secondary | ICD-10-CM | POA: Diagnosis not present

## 2016-01-01 DIAGNOSIS — E039 Hypothyroidism, unspecified: Secondary | ICD-10-CM | POA: Diagnosis not present

## 2016-01-01 DIAGNOSIS — E785 Hyperlipidemia, unspecified: Secondary | ICD-10-CM | POA: Diagnosis not present

## 2016-01-01 DIAGNOSIS — I5033 Acute on chronic diastolic (congestive) heart failure: Secondary | ICD-10-CM | POA: Diagnosis not present

## 2016-01-02 ENCOUNTER — Other Ambulatory Visit (HOSPITAL_COMMUNITY)
Admission: RE | Admit: 2016-01-02 | Discharge: 2016-01-02 | Disposition: A | Discharge status: Home / Self Care | Payer: Medicare Other | Source: Other Acute Inpatient Hospital | Attending: Internal Medicine | Admitting: Internal Medicine

## 2016-01-02 DIAGNOSIS — E785 Hyperlipidemia, unspecified: Secondary | ICD-10-CM | POA: Diagnosis not present

## 2016-01-02 DIAGNOSIS — I5033 Acute on chronic diastolic (congestive) heart failure: Secondary | ICD-10-CM | POA: Diagnosis not present

## 2016-01-02 DIAGNOSIS — I10 Essential (primary) hypertension: Secondary | ICD-10-CM | POA: Insufficient documentation

## 2016-01-02 DIAGNOSIS — E119 Type 2 diabetes mellitus without complications: Secondary | ICD-10-CM | POA: Diagnosis not present

## 2016-01-02 DIAGNOSIS — R899 Unspecified abnormal finding in specimens from other organs, systems and tissues: Secondary | ICD-10-CM | POA: Insufficient documentation

## 2016-01-02 DIAGNOSIS — J189 Pneumonia, unspecified organism: Secondary | ICD-10-CM | POA: Diagnosis not present

## 2016-01-02 DIAGNOSIS — F419 Anxiety disorder, unspecified: Secondary | ICD-10-CM | POA: Diagnosis not present

## 2016-01-02 DIAGNOSIS — E039 Hypothyroidism, unspecified: Secondary | ICD-10-CM | POA: Diagnosis not present

## 2016-01-02 LAB — COMPREHENSIVE METABOLIC PANEL
ALT: 15 U/L (ref 14–54)
AST: 26 U/L (ref 15–41)
Albumin: 3.6 g/dL (ref 3.5–5.0)
Alkaline Phosphatase: 112 U/L (ref 38–126)
Anion gap: 12 (ref 5–15)
BUN: 18 mg/dL (ref 6–20)
CO2: 28 mmol/L (ref 22–32)
Calcium: 8.7 mg/dL — ABNORMAL LOW (ref 8.9–10.3)
Chloride: 99 mmol/L — ABNORMAL LOW (ref 101–111)
Creatinine, Ser: 0.78 mg/dL (ref 0.44–1.00)
GFR calc Af Amer: >60 mL/min (ref >60)
GFR calc non Af Amer: >60 mL/min (ref >60)
Glucose, Bld: 107 mg/dL — ABNORMAL HIGH (ref 65–99)
Potassium: 3.5 mmol/L (ref 3.5–5.1)
Sodium: 139 mmol/L (ref 135–145)
Total Bilirubin: 1.1 mg/dL (ref 0.3–1.2)
Total Protein: 7.1 g/dL (ref 6.5–8.1)

## 2016-01-02 LAB — CBC WITH DIFFERENTIAL/PLATELET
Basophils Absolute: 0.1 10*3/uL (ref 0.0–0.1)
Basophils Relative: 4 %
Eosinophils Absolute: 0.2 10*3/uL (ref 0.0–0.7)
Eosinophils Relative: 5 %
HCT: 39.3 % (ref 36.0–46.0)
Hemoglobin: 12.9 g/dL (ref 12.0–15.0)
Lymphocytes Relative: 40 %
Lymphs Abs: 1.4 10*3/uL (ref 0.7–4.0)
MCH: 32.2 pg (ref 26.0–34.0)
MCHC: 32.8 g/dL (ref 30.0–36.0)
MCV: 98 fL (ref 78.0–100.0)
Monocytes Absolute: 0.2 10*3/uL (ref 0.1–1.0)
Monocytes Relative: 6 %
Neutro Abs: 1.6 10*3/uL — ABNORMAL LOW (ref 1.7–7.7)
Neutrophils Relative %: 45 %
Platelets: 208 10*3/uL (ref 150–400)
RBC: 4.01 MIL/uL (ref 3.87–5.11)
RDW: 15.4 % (ref 11.5–15.5)
WBC: 3.4 10*3/uL — ABNORMAL LOW (ref 4.0–10.5)

## 2016-01-02 LAB — URINALYSIS, ROUTINE W REFLEX MICROSCOPIC
Bilirubin Urine: NEGATIVE
Glucose, UA: NEGATIVE mg/dL
Hgb urine dipstick: NEGATIVE
Ketones, ur: NEGATIVE mg/dL
Leukocytes, UA: NEGATIVE
Nitrite: NEGATIVE
Protein, ur: NEGATIVE mg/dL
Specific Gravity, Urine: <1.005 — ABNORMAL LOW (ref 1.005–1.030)
pH: 5.5 (ref 5.0–8.0)

## 2016-01-02 LAB — LIPID PANEL
Cholesterol: 165 mg/dL (ref 0–200)
HDL: 58 mg/dL (ref >40)
LDL Cholesterol: 86 mg/dL (ref 0–99)
Total CHOL/HDL Ratio: 2.8 RATIO
Triglycerides: 106 mg/dL (ref <150)
VLDL: 21 mg/dL (ref 0–40)

## 2016-01-02 LAB — BRAIN NATRIURETIC PEPTIDE: B Natriuretic Peptide: 241 pg/mL — ABNORMAL HIGH (ref 0.0–100.0)

## 2016-01-02 LAB — VITAMIN B12: Vitamin B-12: 2601 pg/mL — ABNORMAL HIGH (ref 180–914)

## 2016-01-03 LAB — HEMOGLOBIN A1C
Hgb A1c MFr Bld: 7.9 % — ABNORMAL HIGH (ref 4.8–5.6)
Mean Plasma Glucose: 180 mg/dL

## 2016-01-06 DIAGNOSIS — F419 Anxiety disorder, unspecified: Secondary | ICD-10-CM | POA: Diagnosis not present

## 2016-01-06 DIAGNOSIS — J189 Pneumonia, unspecified organism: Secondary | ICD-10-CM | POA: Diagnosis not present

## 2016-01-06 DIAGNOSIS — E785 Hyperlipidemia, unspecified: Secondary | ICD-10-CM | POA: Diagnosis not present

## 2016-01-06 DIAGNOSIS — E039 Hypothyroidism, unspecified: Secondary | ICD-10-CM | POA: Diagnosis not present

## 2016-01-06 DIAGNOSIS — E119 Type 2 diabetes mellitus without complications: Secondary | ICD-10-CM | POA: Diagnosis not present

## 2016-01-06 DIAGNOSIS — I5033 Acute on chronic diastolic (congestive) heart failure: Secondary | ICD-10-CM | POA: Diagnosis not present

## 2016-01-10 DIAGNOSIS — E785 Hyperlipidemia, unspecified: Secondary | ICD-10-CM | POA: Diagnosis not present

## 2016-01-10 DIAGNOSIS — E039 Hypothyroidism, unspecified: Secondary | ICD-10-CM | POA: Diagnosis not present

## 2016-01-10 DIAGNOSIS — J189 Pneumonia, unspecified organism: Secondary | ICD-10-CM | POA: Diagnosis not present

## 2016-01-10 DIAGNOSIS — I5033 Acute on chronic diastolic (congestive) heart failure: Secondary | ICD-10-CM | POA: Diagnosis not present

## 2016-01-10 DIAGNOSIS — E119 Type 2 diabetes mellitus without complications: Secondary | ICD-10-CM | POA: Diagnosis not present

## 2016-01-10 DIAGNOSIS — F419 Anxiety disorder, unspecified: Secondary | ICD-10-CM | POA: Diagnosis not present

## 2016-01-13 ENCOUNTER — Ambulatory Visit: Payer: Medicare Other | Admitting: Physician Assistant

## 2016-01-13 DIAGNOSIS — I10 Essential (primary) hypertension: Secondary | ICD-10-CM | POA: Diagnosis not present

## 2016-01-13 DIAGNOSIS — J189 Pneumonia, unspecified organism: Secondary | ICD-10-CM | POA: Diagnosis not present

## 2016-01-13 DIAGNOSIS — B370 Candidal stomatitis: Secondary | ICD-10-CM | POA: Diagnosis not present

## 2016-01-13 DIAGNOSIS — E119 Type 2 diabetes mellitus without complications: Secondary | ICD-10-CM | POA: Diagnosis not present

## 2016-01-14 DIAGNOSIS — Z961 Presence of intraocular lens: Secondary | ICD-10-CM | POA: Diagnosis not present

## 2016-01-14 DIAGNOSIS — E119 Type 2 diabetes mellitus without complications: Secondary | ICD-10-CM | POA: Diagnosis not present

## 2016-01-17 DIAGNOSIS — F419 Anxiety disorder, unspecified: Secondary | ICD-10-CM | POA: Diagnosis not present

## 2016-01-17 DIAGNOSIS — E785 Hyperlipidemia, unspecified: Secondary | ICD-10-CM | POA: Diagnosis not present

## 2016-01-17 DIAGNOSIS — J189 Pneumonia, unspecified organism: Secondary | ICD-10-CM | POA: Diagnosis not present

## 2016-01-17 DIAGNOSIS — E119 Type 2 diabetes mellitus without complications: Secondary | ICD-10-CM | POA: Diagnosis not present

## 2016-01-17 DIAGNOSIS — I5033 Acute on chronic diastolic (congestive) heart failure: Secondary | ICD-10-CM | POA: Diagnosis not present

## 2016-01-17 DIAGNOSIS — E039 Hypothyroidism, unspecified: Secondary | ICD-10-CM | POA: Diagnosis not present

## 2016-01-20 ENCOUNTER — Ambulatory Visit (INDEPENDENT_AMBULATORY_CARE_PROVIDER_SITE_OTHER): Payer: Medicare Other | Admitting: Physician Assistant

## 2016-01-20 ENCOUNTER — Encounter: Payer: Self-pay | Admitting: Physician Assistant

## 2016-01-20 DIAGNOSIS — I471 Supraventricular tachycardia: Secondary | ICD-10-CM | POA: Diagnosis not present

## 2016-01-20 DIAGNOSIS — I509 Heart failure, unspecified: Secondary | ICD-10-CM | POA: Diagnosis not present

## 2016-01-20 DIAGNOSIS — I2510 Atherosclerotic heart disease of native coronary artery without angina pectoris: Secondary | ICD-10-CM

## 2016-01-20 DIAGNOSIS — I5032 Chronic diastolic (congestive) heart failure: Secondary | ICD-10-CM | POA: Diagnosis not present

## 2016-01-20 LAB — BASIC METABOLIC PANEL
BUN: 14 mg/dL (ref 7–25)
CO2: 31 mmol/L (ref 20–31)
Calcium: 9 mg/dL (ref 8.6–10.4)
Chloride: 101 mmol/L (ref 98–110)
Creat: 0.78 mg/dL (ref 0.60–0.88)
Glucose, Bld: 121 mg/dL — ABNORMAL HIGH (ref 65–99)
Potassium: 4.3 mmol/L (ref 3.5–5.3)
Sodium: 141 mmol/L (ref 135–146)

## 2016-01-20 NOTE — Assessment & Plan Note (Signed)
Patient has lost 5 pounds on increased Lasix and additional hydralazine. She is feeling much better. Continue current medications. Recheck renal function today. Follow-up with Dr.McAlhany as scheduled.

## 2016-01-20 NOTE — Progress Notes (Signed)
Cardiology Office Note   Date:  01/20/2016   ID:  [name], DOB 1931/11/11, MRN [id number]  PCP:  [name], MD  Cardiologist:  [name]   Chief Complaint:  follow-up of CHF    History of Present Illness: [name] is a 82 y.o. female who presents for week follow-up of CHF.  She has a history of CAD, DM, HTN, HLD, PAD and tobacco abuse quit in 2013. In 2011 she had a syncopal episode. CT was negative for PE and EKG had T-wave inversion anterior and inferior. Cardiac cath showed mild disease in the LAD but no obstructive lesions. Echo with normal LV size and function and mild AI. Carotid Dopplers were normal at that time. CT of the chest that shows central lobular emphysema. She was readmitted in 2015 with recurrent chest pain and found to be in SVT converted with IV diltiazem. She has severe bilateral carotid artery disease with total occlusions managed with statin therapy and anti-platelet management. She was admitted to [facility] 02/980 with diastolic heart failure treated with Lasix. Last 2-D echo 05/2015 showed moderate LVH EF 65-70% with abnormal diastolic function.   She saw Lesia Hausen PA 12/02/15 complaining of increased dyspnea on exertion and fatigue with decreased exercise tolerance. This has been going on for the past month. Her spironolactone had been stopped by her PCP because of dizziness. EKG showed sinus bradycardia in the 50s. Nuclear stress test was done on 12/09/15 was a low risk study EF 83% with small mild intensity basal inferior defect consistent with diaphragmatic attenuation. No reversible defects to indicate ischemia.  Patient felt better after 3 days of extra Lasix but feels like the fluid is building back up. BNP was 597, TSH 8.882. She was still very weak. She still has some leg swelling. Her weight was up 2 pounds. I resumed hydralazine 25 mg twice a day and increased her Lasix to 60 mg daily. I decreased her metoprolol to 50 mg in the morning  20 5 at night for bradycardia.  Patient comes in today accompanied by her daughter. She has lost 5 pounds. She is feeling much better. Her daughter keeps close check on her blood pressure and occasionally holds her evening dose of metoprolol because of diastolic blood pressure in the 50s.  Past Medical History  Diagnosis Date  . History of GI bleed   . Parotid tumor   . History of breast cancer   . Type 2 diabetes mellitus ([place])   . Osteopenia   . Essential hypertension, benign   . Hyperlipidemia   . Depression   . Anxiety   . Chronic headaches   . Carotid artery disease (HCC)     Bilateral ICA occlusion  . Orthostatic hypotension     Diabetic polyneuropathy/diabetic dysautonomia   . Coronary atherosclerosis of native coronary artery     Mild nonobstructive disease at cardiac catheterization May 2011  . PSVT (paroxysmal supraventricular tachycardia) ([place])   . Acute diastolic congestive heart failure ([place]) 03/21/2014  . Fracture of femoral neck, right, closed 03/28/2014  . Acute delirium 06/23/2013  . Syncope and collapse 04/15/2010    Qualifier: Diagnosis of  By: Angelena Form, MD, Harrell Gave    . Pressure ulcer, heel, right, unstageable ([place]) 04/17/2014  . UPPER GASTROINTESTINAL HEMORRHAGE 03/21/2007    Qualifier: Diagnosis of  By: Jonna Munro MD, Roderic Scarce    . Chronic liver disease     ?cirrhosis on 10/2014 CT. H/O elevated alkphos and +AMA on URSO.  Past Surgical History  Procedure Laterality Date  . Mastectomy  1994    Left breast -related to breast cancer  . Vesicovaginal fistula closure w/ tah  1979  . Cholecystectomy  2008  . Hemorrhoid surgery  1960s  . Laparoscopic appendectomy N/A 04/13/2013    Procedure: APPENDECTOMY LAPAROSCOPIC;  Surgeon: Donato Heinz, MD;  Location: [facility] ORS;  Service: General;  Laterality: N/A;  . Colonoscopy  2010    Dr. Oneida Alar: single tubular adenoma, one hyperplastic polyp. Next TCS 2020 health permitting.  . Hip arthroplasty Right 03/28/2014     Procedure: ARTHROPLASTY BIPOLAR HIP;  Surgeon: Carole Civil, MD;  Location: [facility] ORS;  Service: Orthopedics;  Laterality: Right;  . Abdominal hysterectomy       Current Outpatient Prescriptions  Medication Sig Dispense Refill  . ALPRAZolam (XANAX) 0.25 MG tablet Take 0.25 mg by mouth 2 (two) times daily as needed for anxiety.    Marland Kitchen atorvastatin (LIPITOR) 10 MG tablet Take 1 tablet (10 mg total) by mouth daily. 30 tablet 0  . cholecalciferol (VITAMIN D) 1000 UNITS tablet Take 1,000 Units by mouth every morning.     . citalopram (CELEXA) 10 MG tablet Take 1 tablet (10 mg total) by mouth daily.    . clopidogrel (PLAVIX) 75 MG tablet Take 75 mg by mouth daily.    Marland Kitchen docusate sodium 100 MG CAPS Take 100 mg by mouth 2 (two) times daily. 10 capsule 0  . feeding supplement, GLUCERNA SHAKE, (GLUCERNA SHAKE) LIQD Take 237 mLs by mouth daily.    . furosemide (LASIX) 40 MG tablet Take 1.5 tablets (60 mg total) by mouth daily. 45 tablet 5  . guaiFENesin (MUCINEX) 600 MG 12 hr tablet Take 1 tablet (600 mg total) by mouth 2 (two) times daily. 30 tablet 0  . hydrALAZINE (APRESOLINE) 25 MG tablet Take 1 tablet (25 mg total) by mouth 2 (two) times daily. 60 tablet 5  . insulin aspart (NOVOLOG) 100 UNIT/ML injection Inject 5 Units into the skin 3 (three) times daily as needed for high blood sugar (when BG is over 100).     . insulin glargine (LANTUS) 100 UNIT/ML injection Inject 8 Units into the skin at bedtime.     Marland Kitchen lisinopril (PRINIVIL,ZESTRIL) 20 MG tablet Take 20 mg by mouth daily.    Marland Kitchen loratadine (CLARITIN) 10 MG tablet Take 10 mg by mouth daily.    . Methylfol-Algae-B12-Acetylcyst (CEREFOLIN NAC) 6-90.314-2-600 MG TABS Take 1 tablet by mouth daily.     . metoprolol (LOPRESSOR) 50 MG tablet Take 22m in the a.m.and 281min the pm    . mirtazapine (REMERON) 30 MG tablet Take 30 mg by mouth at bedtime.    . pantoprazole (PROTONIX) 40 MG tablet Take 1 tablet (40 mg total) by mouth daily. 30 tablet 3  .  psyllium (METAMUCIL SMOOTH TEXTURE) 28 % packet Take 1 packet by mouth 2 (two) times daily.    . Marland Kitcheniotropium (SPIRIVA) 18 MCG inhalation capsule Place 18 mcg into inhaler and inhale daily as needed (shortness of breath).    . URSO FORTE 500 MG tablet TAKE 1 TABLET TWICE A DAY WITH MEALS 180 tablet 3   No current facility-administered medications for this visit.    Allergies:   Aricept; Namenda; Donepezil; Spironolactone; Codeine; Indocin; Latex; and Penicillins    Social History:  The patient  reports that she quit smoking about 2 years ago. Her smoking use included Cigarettes. She has a 20 pack-year smoking history. She does  not have any smokeless tobacco history on file. She reports that she does not drink alcohol or use illicit drugs.   Family History:  The patient's    family history includes Cancer in her father, mother, and sister; Cirrhosis in her brother; Heart failure in her mother; Hypertension in her mother and sister; Kidney cancer in her mother and sister; Lung cancer in her father. There is no history of Heart attack or Stroke.    ROS:  Please see the history of present illness.   Otherwise, review of systems are positive for excessive fatigue, cough, anxiety depression, chronic back pain and balance issues, easy bruising.   All other systems are reviewed and negative.    PHYSICAL EXAM: VS:  BP 132/70 mmHg  Pulse 66  Ht _0  (1.626 m)  Wt 162 lb 12.8 oz (73.846 kg)  BMI 27.93 kg/m2  SpO2 94% , BMI Body mass index is 27.93 kg/(m^2). GEN: Well nourished, well developed, in no acute distress Neck: no JVD, HJR, carotid bruits, or masses Cardiac: RRR; positive S4, 2/6 systolic murmur at the left sternal border, no rubs, thrill or heave,  Respiratory:  clear to auscultation bilaterally, normal work of breathing GI: soft, nontender, nondistended, + BS MS: no deformity or atrophy Extremities: +1 edema bilaterally without cyanosis, clubbing, good distal pulses bilaterally.  Skin:  warm and dry, no rash Neuro:  Chambers and sensation are intact    EKG:  EKG is not ordered today.    Recent Labs: 12/02/2015: Magnesium 1.8; TSH 8.882* 01/02/2016: ALT 15; B Natriuretic Peptide 241.0*; BUN 18; Creatinine, Ser 0.78; Hemoglobin 12.9; Platelets 208; Potassium 3.5; Sodium 139    Lipid Panel    Component Value Date/Time   CHOL 165 01/02/2016 1100   TRIG 106 01/02/2016 1100   HDL 58 01/02/2016 1100   CHOLHDL 2.8 01/02/2016 1100   VLDL 21 01/02/2016 1100   LDLCALC 86 01/02/2016 1100      Wt Readings from Last 3 Encounters:  01/20/16 162 lb 12.8 oz (73.846 kg)  12/24/15 163 lb 6.4 oz (74.118 kg)  12/16/15 167 lb (75.751 kg)      Other studies Reviewed: Additional studies/ records that were reviewed today include and review of the records demonstrates: Nuclear stress test 12/09/15  No diagnostic ST segment abnormalities over baseline changes to indicate ischemia.  Small, mild intensity, basal inferior defect consistent with diaphragmatic attenuation. No reversible defects to indicate ischemia.  This is a low risk study.  Nuclear stress EF: 83%.     2-D echo 06/12/15  Study Conclusions  - Left ventricle: The cavity size was normal. Wall thickness was   increased in a pattern of moderate LVH. Systolic function was   vigorous. The estimated ejection fraction was in the range of 65%   to 70%. Abnormal diastolic function, indeterminate grade. - Aortic valve: Mildly calcified annulus. Trileaflet; mildly   thickened leaflets. Valve area (VTI): 2.13 cm^2. Valve area   (Vmax): 2.32 cm^2. - Mitral valve: Mildly calcified annulus. Mildly thickened leaflets   . - Left atrium: The atrium was mildly dilated. - Right ventricle: The cavity size was mildly dilated. - Right atrium: The atrium was mildly dilated. - Atrial septum: No defect or patent foramen ovale was identified. - Pulmonary arteries: Systolic pressure was moderately increased.   PA peak pressure: 42  mm Hg (S). - Technically adequate study.   ASSESSMENT AND PLAN: Diastolic CHF, chronic (HCC) Patient has lost 5 pounds on increased Lasix and additional hydralazine.  She is feeling much better. Continue current medications. Recheck renal function today. Follow-up with Dr.McAlhany as scheduled.  PSVT (paroxysmal supraventricular tachycardia) No recent trouble with tachycardia. She was bradycardic but heart rate 66 today on reduced dose of metoprolol.  CORONARY ATHEROSCLEROSIS NATIVE CORONARY ARTERY Stable without chest pain      Signed, Ermalinda Barrios, PA-C  01/20/2016 2:34 PM    [facility] [address], [place], [place]  [address] Phone: [phone]; Fax: [phone]

## 2016-01-20 NOTE — Assessment & Plan Note (Signed)
Stable without chest pain

## 2016-01-20 NOTE — Assessment & Plan Note (Signed)
No recent trouble with tachycardia. She was bradycardic but heart rate 66 today on reduced dose of metoprolol.

## 2016-01-20 NOTE — Patient Instructions (Signed)
Medication Instructions:   Your physician recommends that you continue on your current medications as directed. Please refer to the Current Medication list given to you today.  If you need a refill on your cardiac medications before your next appointment, please call your pharmacy.  Labwork:  BMET TODAY    Testing/Procedures: NONE ORDER TODAY    Follow-Up:  AS SCHEDULED WITH MCALHANY    Any Other Special Instructions Will Be Listed Below (If Applicable).

## 2016-01-22 DIAGNOSIS — E785 Hyperlipidemia, unspecified: Secondary | ICD-10-CM | POA: Diagnosis not present

## 2016-01-22 DIAGNOSIS — J189 Pneumonia, unspecified organism: Secondary | ICD-10-CM | POA: Diagnosis not present

## 2016-01-22 DIAGNOSIS — F419 Anxiety disorder, unspecified: Secondary | ICD-10-CM | POA: Diagnosis not present

## 2016-01-22 DIAGNOSIS — I5033 Acute on chronic diastolic (congestive) heart failure: Secondary | ICD-10-CM | POA: Diagnosis not present

## 2016-01-22 DIAGNOSIS — E119 Type 2 diabetes mellitus without complications: Secondary | ICD-10-CM | POA: Diagnosis not present

## 2016-01-22 DIAGNOSIS — E039 Hypothyroidism, unspecified: Secondary | ICD-10-CM | POA: Diagnosis not present

## 2016-01-27 DIAGNOSIS — I5033 Acute on chronic diastolic (congestive) heart failure: Secondary | ICD-10-CM | POA: Diagnosis not present

## 2016-01-27 DIAGNOSIS — E119 Type 2 diabetes mellitus without complications: Secondary | ICD-10-CM | POA: Diagnosis not present

## 2016-01-27 DIAGNOSIS — J189 Pneumonia, unspecified organism: Secondary | ICD-10-CM | POA: Diagnosis not present

## 2016-01-27 DIAGNOSIS — E039 Hypothyroidism, unspecified: Secondary | ICD-10-CM | POA: Diagnosis not present

## 2016-01-27 DIAGNOSIS — F419 Anxiety disorder, unspecified: Secondary | ICD-10-CM | POA: Diagnosis not present

## 2016-01-27 DIAGNOSIS — E785 Hyperlipidemia, unspecified: Secondary | ICD-10-CM | POA: Diagnosis not present

## 2016-01-28 DIAGNOSIS — J189 Pneumonia, unspecified organism: Secondary | ICD-10-CM | POA: Diagnosis not present

## 2016-01-28 DIAGNOSIS — E119 Type 2 diabetes mellitus without complications: Secondary | ICD-10-CM | POA: Diagnosis not present

## 2016-01-28 DIAGNOSIS — E039 Hypothyroidism, unspecified: Secondary | ICD-10-CM | POA: Diagnosis not present

## 2016-01-28 DIAGNOSIS — I5033 Acute on chronic diastolic (congestive) heart failure: Secondary | ICD-10-CM | POA: Diagnosis not present

## 2016-01-28 DIAGNOSIS — E785 Hyperlipidemia, unspecified: Secondary | ICD-10-CM | POA: Diagnosis not present

## 2016-01-28 DIAGNOSIS — F419 Anxiety disorder, unspecified: Secondary | ICD-10-CM | POA: Diagnosis not present

## 2016-01-29 DIAGNOSIS — J189 Pneumonia, unspecified organism: Secondary | ICD-10-CM | POA: Diagnosis not present

## 2016-01-29 DIAGNOSIS — F419 Anxiety disorder, unspecified: Secondary | ICD-10-CM | POA: Diagnosis not present

## 2016-01-29 DIAGNOSIS — E119 Type 2 diabetes mellitus without complications: Secondary | ICD-10-CM | POA: Diagnosis not present

## 2016-01-29 DIAGNOSIS — I5033 Acute on chronic diastolic (congestive) heart failure: Secondary | ICD-10-CM | POA: Diagnosis not present

## 2016-01-29 DIAGNOSIS — E785 Hyperlipidemia, unspecified: Secondary | ICD-10-CM | POA: Diagnosis not present

## 2016-01-29 DIAGNOSIS — E039 Hypothyroidism, unspecified: Secondary | ICD-10-CM | POA: Diagnosis not present

## 2016-01-30 DIAGNOSIS — R05 Cough: Secondary | ICD-10-CM | POA: Diagnosis not present

## 2016-02-03 ENCOUNTER — Other Ambulatory Visit: Payer: Self-pay

## 2016-02-03 ENCOUNTER — Telehealth: Payer: Self-pay | Admitting: Gastroenterology

## 2016-02-03 DIAGNOSIS — K7460 Unspecified cirrhosis of liver: Secondary | ICD-10-CM

## 2016-02-03 NOTE — Telephone Encounter (Signed)
Called [name] and spoke with her. She is aware of appt at [facility] Pt is set for [place] on 02/04/2016 @ 0715

## 2016-02-03 NOTE — Telephone Encounter (Signed)
Patient's daughter, [name], called. They received a letter to schedule Ultrasound for patient. Patient has OV with SF this Thursday. Can she get U/S done and results available before her OV? Please advise and call daughter's cell [phone]

## 2016-02-04 ENCOUNTER — Emergency Department (HOSPITAL_COMMUNITY)
Admission: EM | Admit: 2016-02-04 | Discharge: 2016-02-04 | Disposition: A | Discharge status: Home / Self Care | Payer: Medicare Other | Attending: Emergency Medicine | Admitting: Emergency Medicine

## 2016-02-04 ENCOUNTER — Emergency Department (HOSPITAL_COMMUNITY): Payer: Medicare Other

## 2016-02-04 ENCOUNTER — Encounter (HOSPITAL_COMMUNITY): Payer: Self-pay

## 2016-02-04 ENCOUNTER — Ambulatory Visit (HOSPITAL_COMMUNITY)
Admission: RE | Admit: 2016-02-04 | Discharge: 2016-02-04 | Disposition: A | Discharge status: Home / Self Care | Payer: Medicare Other | Source: Ambulatory Visit | Attending: Gastroenterology | Admitting: Gastroenterology

## 2016-02-04 DIAGNOSIS — Z79899 Other long term (current) drug therapy: Secondary | ICD-10-CM | POA: Insufficient documentation

## 2016-02-04 DIAGNOSIS — Z9049 Acquired absence of other specified parts of digestive tract: Secondary | ICD-10-CM | POA: Insufficient documentation

## 2016-02-04 DIAGNOSIS — E785 Hyperlipidemia, unspecified: Secondary | ICD-10-CM | POA: Diagnosis not present

## 2016-02-04 DIAGNOSIS — M25572 Pain in left ankle and joints of left foot: Secondary | ICD-10-CM | POA: Diagnosis not present

## 2016-02-04 DIAGNOSIS — E119 Type 2 diabetes mellitus without complications: Secondary | ICD-10-CM | POA: Diagnosis not present

## 2016-02-04 DIAGNOSIS — Z794 Long term (current) use of insulin: Secondary | ICD-10-CM | POA: Insufficient documentation

## 2016-02-04 DIAGNOSIS — M79662 Pain in left lower leg: Secondary | ICD-10-CM | POA: Diagnosis not present

## 2016-02-04 DIAGNOSIS — I5031 Acute diastolic (congestive) heart failure: Secondary | ICD-10-CM | POA: Diagnosis not present

## 2016-02-04 DIAGNOSIS — K7460 Unspecified cirrhosis of liver: Secondary | ICD-10-CM

## 2016-02-04 DIAGNOSIS — R52 Pain, unspecified: Secondary | ICD-10-CM

## 2016-02-04 DIAGNOSIS — I2510 Atherosclerotic heart disease of native coronary artery without angina pectoris: Secondary | ICD-10-CM | POA: Diagnosis not present

## 2016-02-04 DIAGNOSIS — I110 Hypertensive heart disease with heart failure: Secondary | ICD-10-CM | POA: Insufficient documentation

## 2016-02-04 DIAGNOSIS — R05 Cough: Secondary | ICD-10-CM | POA: Diagnosis not present

## 2016-02-04 DIAGNOSIS — Z87891 Personal history of nicotine dependence: Secondary | ICD-10-CM | POA: Insufficient documentation

## 2016-02-04 DIAGNOSIS — F329 Major depressive disorder, single episode, unspecified: Secondary | ICD-10-CM | POA: Diagnosis not present

## 2016-02-04 LAB — CBC WITH DIFFERENTIAL/PLATELET
Basophils Absolute: 0.1 10*3/uL (ref 0.0–0.1)
Basophils Relative: 3 %
Eosinophils Absolute: 0.2 10*3/uL (ref 0.0–0.7)
Eosinophils Relative: 4 %
HCT: 42.1 % (ref 36.0–46.0)
Hemoglobin: 14.3 g/dL (ref 12.0–15.0)
Lymphocytes Relative: 37 %
Lymphs Abs: 1.7 10*3/uL (ref 0.7–4.0)
MCH: 33.1 pg (ref 26.0–34.0)
MCHC: 34 g/dL (ref 30.0–36.0)
MCV: 97.5 fL (ref 78.0–100.0)
Monocytes Absolute: 0.3 10*3/uL (ref 0.1–1.0)
Monocytes Relative: 7 %
Neutro Abs: 2.2 10*3/uL (ref 1.7–7.7)
Neutrophils Relative %: 49 %
Platelets: 205 10*3/uL (ref 150–400)
RBC: 4.32 MIL/uL (ref 3.87–5.11)
RDW: 14.7 % (ref 11.5–15.5)
WBC: 4.5 10*3/uL (ref 4.0–10.5)

## 2016-02-04 LAB — BASIC METABOLIC PANEL
Anion gap: 8 (ref 5–15)
BUN: 15 mg/dL (ref 6–20)
CO2: 31 mmol/L (ref 22–32)
Calcium: 9.2 mg/dL (ref 8.9–10.3)
Chloride: 100 mmol/L — ABNORMAL LOW (ref 101–111)
Creatinine, Ser: 0.7 mg/dL (ref 0.44–1.00)
GFR calc Af Amer: >60 mL/min (ref >60)
GFR calc non Af Amer: >60 mL/min (ref >60)
Glucose, Bld: 149 mg/dL — ABNORMAL HIGH (ref 65–99)
Potassium: 3.6 mmol/L (ref 3.5–5.1)
Sodium: 139 mmol/L (ref 135–145)

## 2016-02-04 LAB — BRAIN NATRIURETIC PEPTIDE: B Natriuretic Peptide: 333 pg/mL — ABNORMAL HIGH (ref 0.0–100.0)

## 2016-02-04 MED ORDER — ACETAMINOPHEN 325 MG PO TABS
650.0000 mg | ORAL_TABLET | Freq: Once | ORAL | Status: AC
  Administered 2016-02-04: 650 mg via ORAL
  Filled 2016-02-04: qty 2

## 2016-02-04 NOTE — ED Provider Notes (Signed)
CSN: [id number]     Arrival date & time 02/04/16  7517 History   First MD Initiated Contact with Patient 02/04/16 [phone]     Chief Complaint  Patient presents with  . Leg Pain     (Consider location/radiation/quality/duration/timing/severity/associated sxs/prior Treatment) HPI Patient presents with concern of left lower extremity pain, intermittent swelling, discoloration. Pain is focally in the lateral calf, has been present for about one month, without clear precipitant. Since onset the pain has been intermittent, with increasing frequency and severity. The pain is severe, sharp, worse with inhalation, weightbearing. No new dyspnea, though the patient does have ongoing cough, worsening since episode of pneumonia 1 month ago. No new fever, chills, syncope, chest pain. No new medication.  But the patient's Lasix dose was increased about 6 weeks ago Calf pain minimally controlled with anything. Patient is here with her daughter who assists with the history of present illness.  Patient also has ongoing evaluation for cirrhotic changes noticed on recent physician evaluation. Today she had planned outpatient ultrasound. She denies any new abdominal pain, nausea, fever.   Past Medical History  Diagnosis Date  . History of GI bleed   . Parotid tumor   . History of breast cancer   . Type 2 diabetes mellitus ([place])   . Osteopenia   . Essential hypertension, benign   . Hyperlipidemia   . Depression   . Anxiety   . Chronic headaches   . Carotid artery disease (HCC)     Bilateral ICA occlusion  . Orthostatic hypotension     Diabetic polyneuropathy/diabetic dysautonomia   . Coronary atherosclerosis of native coronary artery     Mild nonobstructive disease at cardiac catheterization May 2011  . PSVT (paroxysmal supraventricular tachycardia) ([place])   . Acute diastolic congestive heart failure ([place]) 03/21/2014  . Fracture of femoral neck, right, closed 03/28/2014  . Acute delirium 06/23/2013    . Syncope and collapse 04/15/2010    Qualifier: Diagnosis of  By: Angelena Form, MD, Harrell Gave    . Pressure ulcer, heel, right, unstageable ([place]) 04/17/2014  . UPPER GASTROINTESTINAL HEMORRHAGE 03/21/2007    Qualifier: Diagnosis of  By: Jonna Munro MD, Roderic Scarce    . Chronic liver disease     ?cirrhosis on 10/2014 CT. H/O elevated alkphos and +AMA on URSO.   Past Surgical History  Procedure Laterality Date  . Mastectomy  1994    Left breast -related to breast cancer  . Vesicovaginal fistula closure w/ tah  1979  . Cholecystectomy  2008  . Hemorrhoid surgery  1960s  . Laparoscopic appendectomy N/A 04/13/2013    Procedure: APPENDECTOMY LAPAROSCOPIC;  Surgeon: Donato Heinz, MD;  Location: [facility];  Service: General;  Laterality: N/A;  . Colonoscopy  2010    Dr. Oneida Alar: single tubular adenoma, one hyperplastic polyp. Next TCS 2020 health permitting.  . Hip arthroplasty Right 03/28/2014    Procedure: ARTHROPLASTY BIPOLAR HIP;  Surgeon: Carole Civil, MD;  Location: [facility];  Service: Orthopedics;  Laterality: Right;  . Abdominal hysterectomy     Family History  Problem Relation Age of Onset  . Heart failure Mother   . Cirrhosis Brother   . Lung cancer Father     Brain METS  . Kidney cancer Mother   . Kidney cancer Sister   . Heart attack Neg Hx   . Stroke Neg Hx   . Cancer Mother   . Cancer Father   . Cancer Sister   . Hypertension Sister   . Hypertension Mother  Social History  Substance Use Topics  . Smoking status: Former Smoker -- 0.50 packs/day for 40 years    Types: Cigarettes    Quit date: 03/30/2013  . Smokeless tobacco: None     Comment: Quit since 03/2013  . Alcohol Use: No   OB History    No data available     Review of Systems  Constitutional:       Per HPI, otherwise negative  HENT:       Per HPI, otherwise negative  Respiratory:       Per HPI, otherwise negative  Cardiovascular:       Per HPI, otherwise negative  Gastrointestinal: Negative for  vomiting.  Endocrine:       Negative aside from HPI  Genitourinary:       Neg aside from HPI   Musculoskeletal:       Per HPI, otherwise negative  Skin: Negative.   Neurological: Positive for weakness. Negative for syncope.      Allergies  Aricept; Namenda; Donepezil; Spironolactone; Codeine; Indocin; Latex; and Penicillins  Home Medications   Prior to Admission medications   Medication Sig Start Date End Date Taking? Authorizing Provider  ALPRAZolam (XANAX) 0.25 MG tablet Take 0.25 mg by mouth 2 (two) times daily as needed for anxiety.    Historical Provider, MD  atorvastatin (LIPITOR) 10 MG tablet Take 1 tablet (10 mg total) by mouth daily. 02/20/14   [name], MD  cholecalciferol (VITAMIN D) 1000 UNITS tablet Take 1,000 Units by mouth every morning.     Historical Provider, MD  citalopram (CELEXA) 10 MG tablet Take 1 tablet (10 mg total) by mouth daily. 04/20/14   Kathie Dike, MD  clopidogrel (PLAVIX) 75 MG tablet Take 75 mg by mouth daily.    Historical Provider, MD  docusate sodium 100 MG CAPS Take 100 mg by mouth 2 (two) times daily. 03/30/14   [name], MD  feeding supplement, GLUCERNA SHAKE, (GLUCERNA SHAKE) LIQD Take 237 mLs by mouth daily.    Historical Provider, MD  furosemide (LASIX) 40 MG tablet Take 1.5 tablets (60 mg total) by mouth daily. 12/16/15   Imogene Burn, PA-C  guaiFENesin (MUCINEX) 600 MG 12 hr tablet Take 1 tablet (600 mg total) by mouth 2 (two) times daily. 12/26/15   Kathie Dike, MD  hydrALAZINE (APRESOLINE) 25 MG tablet Take 1 tablet (25 mg total) by mouth 2 (two) times daily. 12/16/15   Imogene Burn, PA-C  insulin aspart (NOVOLOG) 100 UNIT/ML injection Inject 5 Units into the skin 3 (three) times daily as needed for high blood sugar (when BG is over 100).     Historical Provider, MD  insulin glargine (LANTUS) 100 UNIT/ML injection Inject 8 Units into the skin at bedtime.     Historical Provider, MD  lisinopril (PRINIVIL,ZESTRIL) 20 MG  tablet Take 20 mg by mouth daily.    Historical Provider, MD  loratadine (CLARITIN) 10 MG tablet Take 10 mg by mouth daily.    Historical Provider, MD  Methylfol-Algae-B12-Acetylcyst (CEREFOLIN NAC) 6-90.314-2-600 MG TABS Take 1 tablet by mouth daily.     Historical Provider, MD  metoprolol (LOPRESSOR) 50 MG tablet Take 39m in the a.m.and 227min the pm 12/16/15   MiImogene BurnPA-C  mirtazapine (REMERON) 30 MG tablet Take 30 mg by mouth at bedtime.    Historical Provider, MD  pantoprazole (PROTONIX) 40 MG tablet Take 1 tablet (40 mg total) by mouth daily. 08/31/14   AnOrvil Feil  NP  psyllium (METAMUCIL SMOOTH TEXTURE) 28 % packet Take 1 packet by mouth 2 (two) times daily.    Historical Provider, MD  tiotropium (SPIRIVA) 18 MCG inhalation capsule Place 18 mcg into inhaler and inhale daily as needed (shortness of breath).    Historical Provider, MD  URSO FORTE 500 MG tablet TAKE 1 TABLET TWICE A DAY WITH MEALS 10/08/15   Mahala Menghini, PA-C   BP 153/64 mmHg  Pulse 66  Temp(Src) 98.7 F (37.1 C) (Temporal)  Resp 18  Ht 5' 4" (1.626 m)  Wt 158 lb (71.668 kg)  BMI 27.11 kg/m2  SpO2 93% Physical Exam  Constitutional: She is oriented to person, place, and time. She appears well-developed and well-nourished. No distress.  HENT:  Head: Normocephalic and atraumatic.  Eyes: Conjunctivae and EOM are normal.  Cardiovascular: Normal rate and regular rhythm.   Pulmonary/Chest: Effort normal and breath sounds normal. No stridor. No respiratory distress.  Abdominal: She exhibits no distension.  Musculoskeletal: She exhibits no edema.       Arms: Neurological: She is alert and oriented to person, place, and time. No cranial nerve deficit.  Skin: Skin is warm and dry.     Psychiatric: She has a normal mood and affect.  Nursing note and vitals reviewed.   ED Course  Procedures (including critical care time) Labs Review Labs Reviewed  BASIC METABOLIC PANEL  BRAIN NATRIURETIC PEPTIDE  CBC  WITH DIFFERENTIAL/PLATELET    Imaging Review US Abdomen Limited  02/04/2016  CLINICAL DATA:  Cirrhosis.  Cholecystectomy 10 years prior. EXAM: US ABDOMEN LIMITED - RIGHT UPPER QUADRANT COMPARISON:  07/25/2015 abdominal sonogram. 10/25/2014 CT abdomen/pelvis. FINDINGS: Gallbladder: Status post cholecystectomy. Common bile duct: Diameter: 1 mm Liver: Liver parenchyma is diffusely mildly coarsened in echotexture in the liver surface is diffusely mildly irregular, in keeping with cirrhosis. No liver mass. Liver parenchymal echogenicity is normal. No right upper quadrant ascites. IMPRESSION: 1. Cirrhosis.  No liver mass. 2. No right upper quadrant ascites. 3. Cholecystectomy.  No biliary ductal dilatation. Electronically Signed   By: Ilona Sorrel M.D.   On: 02/04/2016 08:02   I have personally reviewed and evaluated these images and lab results as part of my medical decision-making.   On repeat exam the patient is in no distress. I discussed all findings with patient and her daughter. We again discussed the need for close outpatient follow-up, given the patient's history of congestive heart failure, possibility for some fluid retention. With no distress, she will also follow-up with primary care for additional evaluation, management of her leg pain.  MDM  Pleasant elderly female presents with left calf pain. Patient has history of malignancy, and given her description of new color changes, worsening pain, there is some suspicion for DVT. With moderate suspicion, ultrasound was performed. This was reassuring. Patient notes planes, which was well, in no distress. Patient appropriate for further evaluation, management to occur with her physician.  Carmin Muskrat, MD 02/04/16 1247

## 2016-02-04 NOTE — Discharge Instructions (Signed)
As discussed, your evaluation today has been largely reassuring.  But, it is important that you monitor your condition carefully, and do not hesitate to return to the ED if you develop new, or concerning changes in your condition.  Otherwise, please follow-up with your physician for appropriate ongoing care.  Cryotherapy Cryotherapy means treatment with cold. Ice or gel packs can be used to reduce both pain and swelling. Ice is the most helpful within the first 24 to 48 hours after an injury or flare-up from overusing a muscle or joint. Sprains, strains, spasms, burning pain, shooting pain, and aches can all be eased with ice. Ice can also be used when recovering from surgery. Ice is effective, has very few side effects, and is safe for most people to use. PRECAUTIONS  Ice is not a safe treatment option for people with:  Raynaud phenomenon. This is a condition affecting small blood vessels in the extremities. Exposure to cold may cause your problems to return.  Cold hypersensitivity. There are many forms of cold hypersensitivity, including:  Cold urticaria. Red, itchy hives appear on the skin when the tissues begin to warm after being iced.  Cold erythema. This is a red, itchy rash caused by exposure to cold.  Cold hemoglobinuria. Red blood cells break down when the tissues begin to warm after being iced. The hemoglobin that carry oxygen are passed into the urine because they cannot combine with blood proteins fast enough.  Numbness or altered sensitivity in the area being iced. If you have any of the following conditions, do not use ice until you have discussed cryotherapy with your caregiver:  Heart conditions, such as arrhythmia, angina, or chronic heart disease.  High blood pressure.  Healing wounds or open skin in the area being iced.  Current infections.  Rheumatoid arthritis.  Poor circulation.  Diabetes. Ice slows the blood flow in the region it is applied. This is  beneficial when trying to stop inflamed tissues from spreading irritating chemicals to surrounding tissues. However, if you expose your skin to cold temperatures for too long or without the proper protection, you can damage your skin or nerves. Watch for signs of skin damage due to cold. HOME CARE INSTRUCTIONS Follow these tips to use ice and cold packs safely.  Place a dry or damp towel between the ice and skin. A damp towel will cool the skin more quickly, so you may need to shorten the time that the ice is used.  For a more rapid response, add gentle compression to the ice.  Ice for no more than 10 to 20 minutes at a time. The bonier the area you are icing, the less time it will take to get the benefits of ice.  Check your skin after 5 minutes to make sure there are no signs of a poor response to cold or skin damage.  Rest 20 minutes or more between uses.  Once your skin is numb, you can end your treatment. You can test numbness by very lightly touching your skin. The touch should be so light that you do not see the skin dimple from the pressure of your fingertip. When using ice, most people will feel these normal sensations in this order: cold, burning, aching, and numbness.  Do not use ice on someone who cannot communicate their responses to pain, such as small children or people with dementia. HOW TO MAKE AN ICE PACK Ice packs are the most common way to use ice therapy. Other methods include ice  massage, ice baths, and cryosprays. Muscle creams that cause a cold, tingly feeling do not offer the same benefits that ice offers and should not be used as a substitute unless recommended by your caregiver. To make an ice pack, do one of the following:  Place crushed ice or a bag of frozen vegetables in a sealable plastic bag. Squeeze out the excess air. Place this bag inside another plastic bag. Slide the bag into a pillowcase or place a damp towel between your skin and the bag.  Mix 3 parts  water with 1 part rubbing alcohol. Freeze the mixture in a sealable plastic bag. When you remove the mixture from the freezer, it will be slushy. Squeeze out the excess air. Place this bag inside another plastic bag. Slide the bag into a pillowcase or place a damp towel between your skin and the bag. SEEK MEDICAL CARE IF:  You develop white spots on your skin. This may give the skin a blotchy (mottled) appearance.  Your skin turns blue or pale.  Your skin becomes waxy or hard.  Your swelling gets worse. MAKE SURE YOU:   Understand these instructions.  Will watch your condition.  Will get help right away if you are not doing well or get worse.   This information is not intended to replace advice given to you by your health care provider. Make sure you discuss any questions you have with your health care provider.   Document Released: 06/29/2011 Document Revised: 11/23/2014 Document Reviewed: 06/29/2011 Elsevier Interactive Patient Education [employer].

## 2016-02-04 NOTE — ED Notes (Signed)
Pt reports pain in left lower leg and ankle off and on x 1 month.  No obvious deformity.  Pedal pulse present.  Reports has been talking to her doctor and her home health nurse about same.  Reports pcp changed one of her medications thinking it may be coming from the medicine but hasn't helped.

## 2016-02-04 NOTE — Progress Notes (Signed)
Quick Note:  PT's daughter, [name] is aware. Sending to [facility] to nic for 6 months. ______

## 2016-02-05 DIAGNOSIS — E119 Type 2 diabetes mellitus without complications: Secondary | ICD-10-CM | POA: Diagnosis not present

## 2016-02-05 DIAGNOSIS — M79605 Pain in left leg: Secondary | ICD-10-CM | POA: Diagnosis not present

## 2016-02-05 DIAGNOSIS — M5136 Other intervertebral disc degeneration, lumbar region: Secondary | ICD-10-CM | POA: Diagnosis not present

## 2016-02-05 DIAGNOSIS — F329 Major depressive disorder, single episode, unspecified: Secondary | ICD-10-CM | POA: Diagnosis not present

## 2016-02-05 DIAGNOSIS — I5033 Acute on chronic diastolic (congestive) heart failure: Secondary | ICD-10-CM | POA: Diagnosis not present

## 2016-02-05 DIAGNOSIS — E039 Hypothyroidism, unspecified: Secondary | ICD-10-CM | POA: Diagnosis not present

## 2016-02-05 DIAGNOSIS — M79604 Pain in right leg: Secondary | ICD-10-CM | POA: Diagnosis not present

## 2016-02-05 DIAGNOSIS — K7460 Unspecified cirrhosis of liver: Secondary | ICD-10-CM | POA: Diagnosis not present

## 2016-02-05 DIAGNOSIS — E784 Other hyperlipidemia: Secondary | ICD-10-CM | POA: Diagnosis not present

## 2016-02-05 DIAGNOSIS — E785 Hyperlipidemia, unspecified: Secondary | ICD-10-CM | POA: Diagnosis not present

## 2016-02-05 DIAGNOSIS — J189 Pneumonia, unspecified organism: Secondary | ICD-10-CM | POA: Diagnosis not present

## 2016-02-05 DIAGNOSIS — F419 Anxiety disorder, unspecified: Secondary | ICD-10-CM | POA: Diagnosis not present

## 2016-02-05 DIAGNOSIS — Z1389 Encounter for screening for other disorder: Secondary | ICD-10-CM | POA: Diagnosis not present

## 2016-02-06 ENCOUNTER — Ambulatory Visit (INDEPENDENT_AMBULATORY_CARE_PROVIDER_SITE_OTHER): Payer: Medicare Other | Admitting: Gastroenterology

## 2016-02-06 ENCOUNTER — Encounter: Payer: Self-pay | Admitting: Gastroenterology

## 2016-02-06 VITALS — BP 152/74 | HR 71 | Temp 98.1°F | Ht 64.0 in | Wt 159.4 lb

## 2016-02-06 DIAGNOSIS — F419 Anxiety disorder, unspecified: Secondary | ICD-10-CM | POA: Diagnosis not present

## 2016-02-06 DIAGNOSIS — I5033 Acute on chronic diastolic (congestive) heart failure: Secondary | ICD-10-CM | POA: Diagnosis not present

## 2016-02-06 DIAGNOSIS — E785 Hyperlipidemia, unspecified: Secondary | ICD-10-CM | POA: Diagnosis not present

## 2016-02-06 DIAGNOSIS — I2510 Atherosclerotic heart disease of native coronary artery without angina pectoris: Secondary | ICD-10-CM | POA: Diagnosis not present

## 2016-02-06 DIAGNOSIS — E119 Type 2 diabetes mellitus without complications: Secondary | ICD-10-CM | POA: Diagnosis not present

## 2016-02-06 DIAGNOSIS — K743 Primary biliary cirrhosis: Secondary | ICD-10-CM | POA: Diagnosis not present

## 2016-02-06 DIAGNOSIS — E039 Hypothyroidism, unspecified: Secondary | ICD-10-CM | POA: Diagnosis not present

## 2016-02-06 DIAGNOSIS — J189 Pneumonia, unspecified organism: Secondary | ICD-10-CM | POA: Diagnosis not present

## 2016-02-06 LAB — AFP TUMOR MARKER: AFP-Tumor Marker: 8.6 ng/mL — ABNORMAL HIGH (ref <6.1)

## 2016-02-06 NOTE — Progress Notes (Signed)
CC'ED TO PCP

## 2016-02-06 NOTE — Assessment & Plan Note (Signed)
WELL COMPENSATED DISEASE.  CONTINUE URSO. Complete labs and ultrasound. FOLLOW UP IN SEP 2017. PLEASE CALL WITH QUESTIONS OR CONCERNS.

## 2016-02-06 NOTE — Progress Notes (Signed)
Subjective:    Patient ID: [name], female    DOB: 03-11-1931, 82 y.o.   MRN: [id number]  [name], MD  HPI GOT SOB AND THEN IN HOSPITAL FOR FOUR DAY AND HAD PNA.. SOB NOW BACK O BASELINE. BMs: 1-2X/DAY. MEDS MAY BE MAKING HER FEEL CONSTIPATED. TAKING METAMUCIL. ENERGY LEVEL PRETTY GOOD. WEIGHT STABLE SINCE 2016.  PT DENIES FEVER, CHILLS, HEMATOCHEZIA, HEMATEMESIS, nausea, vomiting, melena, diarrhea, CHEST PAIN, SHORTNESS OF BREATH,  CHANGE IN BOWEL IN HABITS, abdominal pain, problems swallowing, ITCHING, OR heartburn or indigestion.   Past Medical History  Diagnosis Date  . History of GI bleed   . Parotid tumor   . History of breast cancer   . Type 2 diabetes mellitus ([place])   . Osteopenia   . Essential hypertension, benign   . Hyperlipidemia   . Depression   . Anxiety   . Chronic headaches   . Carotid artery disease ([facility])     Bilateral ICA occlusion  . Orthostatic hypotension     Diabetic polyneuropathy/diabetic dysautonomia   . Coronary atherosclerosis of native coronary artery     Mild nonobstructive disease at cardiac catheterization May 2011  . PSVT (paroxysmal supraventricular tachycardia) ([place])   . Acute diastolic congestive heart failure ([place]) 03/21/2014  . Fracture of femoral neck, right, closed 03/28/2014  . Acute delirium 06/23/2013  . Syncope and collapse 04/15/2010    Qualifier: Diagnosis of  By: Angelena Form, MD, Harrell Gave    . Pressure ulcer, heel, right, unstageable ([place]) 04/17/2014  . UPPER GASTROINTESTINAL HEMORRHAGE 03/21/2007    Qualifier: Diagnosis of  By: Jonna Munro MD, Roderic Scarce    . Chronic liver disease     ?cirrhosis on 10/2014 CT. H/O elevated alkphos and +AMA on URSO.   Past Surgical History  Procedure Laterality Date  . Mastectomy  1994    Left breast -related to breast cancer  . Vesicovaginal fistula closure w/ tah  1979  . Cholecystectomy  2008  . Hemorrhoid surgery  1960s  . Laparoscopic appendectomy N/A 04/13/2013    Procedure: APPENDECTOMY  LAPAROSCOPIC;  Surgeon: Donato Heinz, MD;  Location: [facility];  Service: General;  Laterality: N/A;  . Colonoscopy  2010    Dr. Oneida Alar: single tubular adenoma, one hyperplastic polyp. Next TCS 2020 health permitting.  . Hip arthroplasty Right 03/28/2014    Procedure: ARTHROPLASTY BIPOLAR HIP;  Surgeon: Carole Civil, MD;  Location: [facility];  Service: Orthopedics;  Laterality: Right;  . Abdominal hysterectomy      Allergies  Allergen Reactions  . Aricept [Donepezil Hcl]     Hives, vomiting and headache  . Namenda [Memantine Hcl]     hives  . Donepezil Nausea Only  . Spironolactone Other (See Comments)    Dizziness, balance problems  . Codeine Rash  . Indocin [Indomethacin] Rash  . Latex Rash  . Penicillins Rash    Current Outpatient Prescriptions  Medication Sig Dispense Refill  . ALPRAZolam (XANAX) 0.25 MG tablet Take 0.25 mg by mouth 2 (two) times daily as needed for anxiety.    Marland Kitchen atorvastatin (LIPITOR) 10 MG tablet Take 1 tablet (10 mg total) by mouth daily.    Marland Kitchen azithromycin (ZITHROMAX) 250 MG tablet Take by mouth daily.    . benzonatate (TESSALON) 100 MG capsule Take by mouth 3 (three) times daily as needed for cough.    . cholecalciferol (VITAMIN D) 1000 UNITS tablet Take 1,000 Units by mouth every morning.     . citalopram (CELEXA) 10 MG  tablet Take 1 tablet (10 mg total) by mouth daily.    . clopidogrel (PLAVIX) 75 MG tablet Take 75 mg by mouth daily.    Marland Kitchen docusate sodium (COLACE) 100 MG capsule Take 100 mg by mouth 2 (two) times daily.    . feeding supplement, GLUCERNA SHAKE, (GLUCERNA SHAKE) LIQD Take 237 mLs by mouth daily. ONE A DAY   . furosemide (LASIX) 40 MG tablet Take 1.5 tablets (60 mg total) by mouth daily.    Marland Kitchen guaiFENesin (MUCINEX) 600 MG 12 hr tablet Take 1 tablet (600 mg total) by mouth 2 (two) times daily.    . hydrALAZINE (APRESOLINE) 25 MG tablet Take 1 tablet (25 mg total) by mouth 2 (two) times daily.    . insulin aspart (NOVOLOG FLEXPEN) 100  UNIT/ML FlexPen Inject 5 Units into the skin 3 (three) times daily with meals. When BG is over 100    . Insulin Glargine (LANTUS SOLOSTAR) 100 UNIT/ML Solostar Pen Inject 8 Units into the skin at bedtime.    Marland Kitchen loratadine (CLARITIN) 10 MG tablet Take 10 mg by mouth daily.    Marland Kitchen losartan (COZAAR) 25 MG tablet Take 25 mg by mouth daily.    . metFORMIN (GLUCOPHAGE) 500 MG tablet Take by mouth daily with breakfast.    . metoprolol (LOPRESSOR) 50 MG tablet Take 71m in the a.m.and 2103min the pm    . mirtazapine (REMERON) 30 MG tablet Take 30 mg by mouth at bedtime.    . naproxen (NAPROSYN) 500 MG tablet Take 250 mg by mouth 2 (two) times daily with a meal.    . Omega-3 Fatty Acids (FISH OIL) 1000 MG CAPS Take 1,000 mg by mouth daily.    . pantoprazole (PROTONIX) 40 MG tablet Take 1 tablet (40 mg total) by mouth daily.    . psyllium (METAMUCIL SMOOTH TEXTURE) 28 % packet Take 1 packet by mouth daily.     . Marland Kitcheniotropium (SPIRIVA) 18 MCG inhalation capsule Place 18 mcg into inhaler and inhale daily.     . Marland KitchenRSO FORTE 500 MG tablet TAKE 1 TABLET TWICE A DAY WITH MEALS    . docusate sodium 100 MG CAPS Take 100 mg by mouth 2 (two) times daily.    . Methylfol-Algae-B12-Acetylcyst (CEREFOLIN NAC) 6-90.314-2-600 MG TABS Take 1 tablet by mouth daily.     . Multiple Vitamin (MULTIVITAMIN) tablet Take 1 tablet by mouth daily. Reported on 02/06/2016     Review of Systems PER HPI OTHERWISE ALL SYSTEMS ARE NEGATIVE.    Objective:   Physical Exam  Constitutional: She is oriented to person, place, and time. She appears well-developed and well-nourished. No distress.  HENT:  Head: Normocephalic and atraumatic.  Mouth/Throat: Oropharynx is clear and moist. No oropharyngeal exudate.  Eyes: Pupils are equal, round, and reactive to light. No scleral icterus.  Neck: Normal range of motion. Neck supple.  Cardiovascular: Normal rate, regular rhythm and normal heart sounds.   Pulmonary/Chest: Effort normal and breath  sounds normal. No respiratory distress.  Abdominal: Soft. Bowel sounds are normal. She exhibits no distension. There is no tenderness.  Musculoskeletal: She exhibits no edema.  Lymphadenopathy:    She has no cervical adenopathy.  Neurological: She is alert and oriented to person, place, and time.  NO FOCAL DEFICITS  Psychiatric: She has a normal mood and affect.  Vitals reviewed.     Assessment & Plan:

## 2016-02-06 NOTE — Patient Instructions (Signed)
CONTINUE URSO FORTE.  Complete labs and ultrasound IN SEP 2017.  FOLLOW UP IN SEP 2017.   PLEASE CALL WITH QUESTIONS OR CONCERNS.

## 2016-02-07 DIAGNOSIS — J189 Pneumonia, unspecified organism: Secondary | ICD-10-CM | POA: Diagnosis not present

## 2016-02-07 DIAGNOSIS — I5033 Acute on chronic diastolic (congestive) heart failure: Secondary | ICD-10-CM | POA: Diagnosis not present

## 2016-02-07 DIAGNOSIS — E119 Type 2 diabetes mellitus without complications: Secondary | ICD-10-CM | POA: Diagnosis not present

## 2016-02-07 DIAGNOSIS — E039 Hypothyroidism, unspecified: Secondary | ICD-10-CM | POA: Diagnosis not present

## 2016-02-07 DIAGNOSIS — E785 Hyperlipidemia, unspecified: Secondary | ICD-10-CM | POA: Diagnosis not present

## 2016-02-07 DIAGNOSIS — F419 Anxiety disorder, unspecified: Secondary | ICD-10-CM | POA: Diagnosis not present

## 2016-02-11 DIAGNOSIS — E039 Hypothyroidism, unspecified: Secondary | ICD-10-CM | POA: Diagnosis not present

## 2016-02-11 DIAGNOSIS — E119 Type 2 diabetes mellitus without complications: Secondary | ICD-10-CM | POA: Diagnosis not present

## 2016-02-11 DIAGNOSIS — J189 Pneumonia, unspecified organism: Secondary | ICD-10-CM | POA: Diagnosis not present

## 2016-02-11 DIAGNOSIS — M19071 Primary osteoarthritis, right ankle and foot: Secondary | ICD-10-CM | POA: Diagnosis not present

## 2016-02-11 DIAGNOSIS — L602 Onychogryphosis: Secondary | ICD-10-CM | POA: Diagnosis not present

## 2016-02-11 DIAGNOSIS — E785 Hyperlipidemia, unspecified: Secondary | ICD-10-CM | POA: Diagnosis not present

## 2016-02-11 DIAGNOSIS — E1351 Other specified diabetes mellitus with diabetic peripheral angiopathy without gangrene: Secondary | ICD-10-CM | POA: Diagnosis not present

## 2016-02-11 DIAGNOSIS — F419 Anxiety disorder, unspecified: Secondary | ICD-10-CM | POA: Diagnosis not present

## 2016-02-11 DIAGNOSIS — I70293 Other atherosclerosis of native arteries of extremities, bilateral legs: Secondary | ICD-10-CM | POA: Diagnosis not present

## 2016-02-11 DIAGNOSIS — I5033 Acute on chronic diastolic (congestive) heart failure: Secondary | ICD-10-CM | POA: Diagnosis not present

## 2016-02-14 DIAGNOSIS — J189 Pneumonia, unspecified organism: Secondary | ICD-10-CM | POA: Diagnosis not present

## 2016-02-14 DIAGNOSIS — E119 Type 2 diabetes mellitus without complications: Secondary | ICD-10-CM | POA: Diagnosis not present

## 2016-02-14 DIAGNOSIS — E039 Hypothyroidism, unspecified: Secondary | ICD-10-CM | POA: Diagnosis not present

## 2016-02-14 DIAGNOSIS — I5033 Acute on chronic diastolic (congestive) heart failure: Secondary | ICD-10-CM | POA: Diagnosis not present

## 2016-02-14 DIAGNOSIS — F419 Anxiety disorder, unspecified: Secondary | ICD-10-CM | POA: Diagnosis not present

## 2016-02-14 DIAGNOSIS — E785 Hyperlipidemia, unspecified: Secondary | ICD-10-CM | POA: Diagnosis not present

## 2016-02-18 ENCOUNTER — Other Ambulatory Visit: Payer: Self-pay | Admitting: *Deleted

## 2016-02-18 MED ORDER — FUROSEMIDE 40 MG PO TABS: 60.0000 mg | ORAL_TABLET | Freq: Every day | ORAL | Status: AC

## 2016-02-21 DIAGNOSIS — E119 Type 2 diabetes mellitus without complications: Secondary | ICD-10-CM | POA: Diagnosis not present

## 2016-02-21 DIAGNOSIS — E039 Hypothyroidism, unspecified: Secondary | ICD-10-CM | POA: Diagnosis not present

## 2016-02-21 DIAGNOSIS — F419 Anxiety disorder, unspecified: Secondary | ICD-10-CM | POA: Diagnosis not present

## 2016-02-21 DIAGNOSIS — J189 Pneumonia, unspecified organism: Secondary | ICD-10-CM | POA: Diagnosis not present

## 2016-02-21 DIAGNOSIS — I5033 Acute on chronic diastolic (congestive) heart failure: Secondary | ICD-10-CM | POA: Diagnosis not present

## 2016-02-21 DIAGNOSIS — E785 Hyperlipidemia, unspecified: Secondary | ICD-10-CM | POA: Diagnosis not present

## 2016-02-26 DIAGNOSIS — E039 Hypothyroidism, unspecified: Secondary | ICD-10-CM | POA: Diagnosis not present

## 2016-02-26 DIAGNOSIS — F419 Anxiety disorder, unspecified: Secondary | ICD-10-CM | POA: Diagnosis not present

## 2016-02-26 DIAGNOSIS — E785 Hyperlipidemia, unspecified: Secondary | ICD-10-CM | POA: Diagnosis not present

## 2016-02-26 DIAGNOSIS — E119 Type 2 diabetes mellitus without complications: Secondary | ICD-10-CM | POA: Diagnosis not present

## 2016-02-26 DIAGNOSIS — Z794 Long term (current) use of insulin: Secondary | ICD-10-CM | POA: Diagnosis not present

## 2016-02-26 DIAGNOSIS — Z9981 Dependence on supplemental oxygen: Secondary | ICD-10-CM | POA: Diagnosis not present

## 2016-02-26 DIAGNOSIS — Z87891 Personal history of nicotine dependence: Secondary | ICD-10-CM | POA: Diagnosis not present

## 2016-02-26 DIAGNOSIS — I5033 Acute on chronic diastolic (congestive) heart failure: Secondary | ICD-10-CM | POA: Diagnosis not present

## 2016-02-27 DIAGNOSIS — E785 Hyperlipidemia, unspecified: Secondary | ICD-10-CM | POA: Diagnosis not present

## 2016-02-27 DIAGNOSIS — E119 Type 2 diabetes mellitus without complications: Secondary | ICD-10-CM | POA: Diagnosis not present

## 2016-02-27 DIAGNOSIS — I5033 Acute on chronic diastolic (congestive) heart failure: Secondary | ICD-10-CM | POA: Diagnosis not present

## 2016-02-27 DIAGNOSIS — E039 Hypothyroidism, unspecified: Secondary | ICD-10-CM | POA: Diagnosis not present

## 2016-02-27 DIAGNOSIS — Z794 Long term (current) use of insulin: Secondary | ICD-10-CM | POA: Diagnosis not present

## 2016-02-27 DIAGNOSIS — F419 Anxiety disorder, unspecified: Secondary | ICD-10-CM | POA: Diagnosis not present

## 2016-03-05 DIAGNOSIS — E119 Type 2 diabetes mellitus without complications: Secondary | ICD-10-CM | POA: Diagnosis not present

## 2016-03-05 DIAGNOSIS — E039 Hypothyroidism, unspecified: Secondary | ICD-10-CM | POA: Diagnosis not present

## 2016-03-05 DIAGNOSIS — Z794 Long term (current) use of insulin: Secondary | ICD-10-CM | POA: Diagnosis not present

## 2016-03-05 DIAGNOSIS — F419 Anxiety disorder, unspecified: Secondary | ICD-10-CM | POA: Diagnosis not present

## 2016-03-05 DIAGNOSIS — I5033 Acute on chronic diastolic (congestive) heart failure: Secondary | ICD-10-CM | POA: Diagnosis not present

## 2016-03-05 DIAGNOSIS — E785 Hyperlipidemia, unspecified: Secondary | ICD-10-CM | POA: Diagnosis not present

## 2016-03-06 ENCOUNTER — Encounter: Payer: Medicare Other | Admitting: Cardiovascular Disease

## 2016-03-06 NOTE — Progress Notes (Signed)
cancel

## 2016-03-13 DIAGNOSIS — Z794 Long term (current) use of insulin: Secondary | ICD-10-CM | POA: Diagnosis not present

## 2016-03-13 DIAGNOSIS — E119 Type 2 diabetes mellitus without complications: Secondary | ICD-10-CM | POA: Diagnosis not present

## 2016-03-13 DIAGNOSIS — F419 Anxiety disorder, unspecified: Secondary | ICD-10-CM | POA: Diagnosis not present

## 2016-03-13 DIAGNOSIS — E785 Hyperlipidemia, unspecified: Secondary | ICD-10-CM | POA: Diagnosis not present

## 2016-03-13 DIAGNOSIS — I5033 Acute on chronic diastolic (congestive) heart failure: Secondary | ICD-10-CM | POA: Diagnosis not present

## 2016-03-13 DIAGNOSIS — E039 Hypothyroidism, unspecified: Secondary | ICD-10-CM | POA: Diagnosis not present

## 2016-03-17 DIAGNOSIS — F419 Anxiety disorder, unspecified: Secondary | ICD-10-CM | POA: Diagnosis not present

## 2016-03-17 DIAGNOSIS — Z794 Long term (current) use of insulin: Secondary | ICD-10-CM | POA: Diagnosis not present

## 2016-03-17 DIAGNOSIS — E785 Hyperlipidemia, unspecified: Secondary | ICD-10-CM | POA: Diagnosis not present

## 2016-03-17 DIAGNOSIS — E119 Type 2 diabetes mellitus without complications: Secondary | ICD-10-CM | POA: Diagnosis not present

## 2016-03-17 DIAGNOSIS — E039 Hypothyroidism, unspecified: Secondary | ICD-10-CM | POA: Diagnosis not present

## 2016-03-17 DIAGNOSIS — I5033 Acute on chronic diastolic (congestive) heart failure: Secondary | ICD-10-CM | POA: Diagnosis not present

## 2016-03-26 DIAGNOSIS — F419 Anxiety disorder, unspecified: Secondary | ICD-10-CM | POA: Diagnosis not present

## 2016-03-26 DIAGNOSIS — I5033 Acute on chronic diastolic (congestive) heart failure: Secondary | ICD-10-CM | POA: Diagnosis not present

## 2016-03-26 DIAGNOSIS — E785 Hyperlipidemia, unspecified: Secondary | ICD-10-CM | POA: Diagnosis not present

## 2016-03-26 DIAGNOSIS — E039 Hypothyroidism, unspecified: Secondary | ICD-10-CM | POA: Diagnosis not present

## 2016-03-26 DIAGNOSIS — E119 Type 2 diabetes mellitus without complications: Secondary | ICD-10-CM | POA: Diagnosis not present

## 2016-03-26 DIAGNOSIS — Z794 Long term (current) use of insulin: Secondary | ICD-10-CM | POA: Diagnosis not present

## 2016-03-30 DIAGNOSIS — F419 Anxiety disorder, unspecified: Secondary | ICD-10-CM | POA: Diagnosis not present

## 2016-03-30 DIAGNOSIS — E119 Type 2 diabetes mellitus without complications: Secondary | ICD-10-CM | POA: Diagnosis not present

## 2016-03-30 DIAGNOSIS — E785 Hyperlipidemia, unspecified: Secondary | ICD-10-CM | POA: Diagnosis not present

## 2016-03-30 DIAGNOSIS — Z794 Long term (current) use of insulin: Secondary | ICD-10-CM | POA: Diagnosis not present

## 2016-03-30 DIAGNOSIS — I5033 Acute on chronic diastolic (congestive) heart failure: Secondary | ICD-10-CM | POA: Diagnosis not present

## 2016-03-30 DIAGNOSIS — E039 Hypothyroidism, unspecified: Secondary | ICD-10-CM | POA: Diagnosis not present

## 2016-04-09 DIAGNOSIS — E119 Type 2 diabetes mellitus without complications: Secondary | ICD-10-CM | POA: Diagnosis not present

## 2016-04-09 DIAGNOSIS — F419 Anxiety disorder, unspecified: Secondary | ICD-10-CM | POA: Diagnosis not present

## 2016-04-09 DIAGNOSIS — Z794 Long term (current) use of insulin: Secondary | ICD-10-CM | POA: Diagnosis not present

## 2016-04-09 DIAGNOSIS — E039 Hypothyroidism, unspecified: Secondary | ICD-10-CM | POA: Diagnosis not present

## 2016-04-09 DIAGNOSIS — I5033 Acute on chronic diastolic (congestive) heart failure: Secondary | ICD-10-CM | POA: Diagnosis not present

## 2016-04-09 DIAGNOSIS — E785 Hyperlipidemia, unspecified: Secondary | ICD-10-CM | POA: Diagnosis not present

## 2016-04-10 ENCOUNTER — Encounter: Payer: Self-pay | Admitting: Cardiovascular Disease

## 2016-04-10 ENCOUNTER — Ambulatory Visit (INDEPENDENT_AMBULATORY_CARE_PROVIDER_SITE_OTHER): Payer: Medicare Other | Admitting: Cardiovascular Disease

## 2016-04-10 VITALS — BP 120/88 | HR 60 | Ht 64.0 in | Wt 159.4 lb

## 2016-04-10 DIAGNOSIS — I471 Supraventricular tachycardia: Secondary | ICD-10-CM

## 2016-04-10 DIAGNOSIS — I25119 Atherosclerotic heart disease of native coronary artery with unspecified angina pectoris: Secondary | ICD-10-CM

## 2016-04-10 DIAGNOSIS — I2510 Atherosclerotic heart disease of native coronary artery without angina pectoris: Secondary | ICD-10-CM | POA: Diagnosis not present

## 2016-04-10 DIAGNOSIS — F17201 Nicotine dependence, unspecified, in remission: Secondary | ICD-10-CM

## 2016-04-10 DIAGNOSIS — I10 Essential (primary) hypertension: Secondary | ICD-10-CM

## 2016-04-10 DIAGNOSIS — R5383 Other fatigue: Secondary | ICD-10-CM

## 2016-04-10 NOTE — Progress Notes (Signed)
Chief Complaint  Patient presents with  . Follow-up     History of Present Illness: 82 yo AAF with history of mild CAD, DM, HTN, hyperlipidemia, PAD, tobacco abuse admitted to [facility] May 13,2011 with a syncopal episode. Her troponin was elevated at 0.96 and her D-dimer was elevated at 0.93. CT angiogram chest without evidence of PE. EKG with T wave inversions anterior and inferior. Cardiac cath on 03/31/10 with mild disease in the LAD but no obstructive lesions. Echo with normal LV size and function with mild AI. Carotid dopplers normal at that time. Her CT chest did show centrilobular emphysema. Echo 02/17/14 with normal LV function, severe LVH, moderate TR. She had right hip surgery on 03/28/14 who was transferred to the [facility] center for rehabilitation following discharge. She was readmitted 04/18/14 with recurrent chest pain described as a smothering sensation and palpitations and was found to be in SVT. She converted with IV Cardizem. She also has severe bilateral carotid artery disease with total occlusions managed with statin therapy and antiplatelet management. Admitted to [facility] with diastolic CHF and diuresed with Lasix. Echo July 2016 with normal LV systolic function. No valve disease. Nuclear stress test January 2017 with no ischemia.   She is here today for cardiac follow up. She has been doing well. No chest pain. No syncope. She denies palpitations, lower ext edema. No claudication. She has stopped smoking. She has been feeling weak. No specific complaints. Labs from January 2017 here with TSH 8, borderline low T3, T4. She is planning to see DR. Kim next week.   Primary Care Physician: [name], MD  Last Lipid Profile: Followed in primary care.   Past Medical History  Diagnosis Date  . History of GI bleed   . Parotid tumor   . History of breast cancer   . Type 2 diabetes mellitus ([place])   . Osteopenia   . Essential hypertension, benign   . Hyperlipidemia   .  Depression   . Anxiety   . Chronic headaches   . Carotid artery disease (HCC)     Bilateral ICA occlusion  . Orthostatic hypotension     Diabetic polyneuropathy/diabetic dysautonomia   . Coronary atherosclerosis of native coronary artery     Mild nonobstructive disease at cardiac catheterization May 2011  . PSVT (paroxysmal supraventricular tachycardia) ([place])   . Acute diastolic congestive heart failure ([place]) 03/21/2014  . Fracture of femoral neck, right, closed 03/28/2014  . Acute delirium 06/23/2013  . Syncope and collapse 04/15/2010    Qualifier: Diagnosis of  By: Angelena Form, MD, Harrell Gave    . Pressure ulcer, heel, right, unstageable ([place]) 04/17/2014  . UPPER GASTROINTESTINAL HEMORRHAGE 03/21/2007    Qualifier: Diagnosis of  By: Jonna Munro MD, Roderic Scarce    . Chronic liver disease     ?cirrhosis on 10/2014 CT. H/O elevated alkphos and +AMA on URSO.    Past Surgical History  Procedure Laterality Date  . Mastectomy  1994    Left breast -related to breast cancer  . Vesicovaginal fistula closure w/ tah  1979  . Cholecystectomy  2008  . Hemorrhoid surgery  1960s  . Laparoscopic appendectomy N/A 04/13/2013    Procedure: APPENDECTOMY LAPAROSCOPIC;  Surgeon: Donato Heinz, MD;  Location: [facility];  Service: General;  Laterality: N/A;  . Colonoscopy  2010    Dr. Oneida Alar: single tubular adenoma, one hyperplastic polyp. Next TCS 2020 health permitting.  . Hip arthroplasty Right 03/28/2014    Procedure: ARTHROPLASTY BIPOLAR  HIP;  Surgeon: Carole Civil, MD;  Location: [facility];  Service: Orthopedics;  Laterality: Right;  . Abdominal hysterectomy      Current Outpatient Prescriptions  Medication Sig Dispense Refill  . ALPRAZolam (XANAX) 0.25 MG tablet Take 0.25 mg by mouth 2 (two) times daily as needed for anxiety.    Marland Kitchen atorvastatin (LIPITOR) 10 MG tablet Take 1 tablet (10 mg total) by mouth daily. 30 tablet 0  . cholecalciferol (VITAMIN D) 1000 UNITS tablet Take 1,000 Units by mouth every  morning.     . citalopram (CELEXA) 10 MG tablet Take 1 tablet (10 mg total) by mouth daily.    . clopidogrel (PLAVIX) 75 MG tablet Take 75 mg by mouth daily.    Marland Kitchen docusate sodium (COLACE) 100 MG capsule Take 100 mg by mouth 2 (two) times daily.    . feeding supplement, GLUCERNA SHAKE, (GLUCERNA SHAKE) LIQD Take 237 mLs by mouth daily.    . furosemide (LASIX) 40 MG tablet Take 1.5 tablets (60 mg total) by mouth daily. 135 tablet 3  . guaiFENesin (MUCINEX) 600 MG 12 hr tablet Take 1 tablet (600 mg total) by mouth 2 (two) times daily. 30 tablet 0  . hydrALAZINE (APRESOLINE) 25 MG tablet Take 1 tablet (25 mg total) by mouth 2 (two) times daily. 60 tablet 5  . insulin aspart (NOVOLOG FLEXPEN) 100 UNIT/ML FlexPen Inject 5 Units into the skin 3 (three) times daily with meals. When BG is over 100    . Insulin Glargine (LANTUS SOLOSTAR) 100 UNIT/ML Solostar Pen Inject 8 Units into the skin at bedtime.    Marland Kitchen loratadine (CLARITIN) 10 MG tablet Take 10 mg by mouth daily.    Marland Kitchen losartan (COZAAR) 25 MG tablet Take 25 mg by mouth daily.    . metFORMIN (GLUCOPHAGE) 500 MG tablet Take by mouth daily with breakfast.    . Methylfol-Algae-B12-Acetylcyst (CEREFOLIN NAC) 6-90.314-2-600 MG TABS Take 1 tablet by mouth daily.     . metoprolol (LOPRESSOR) 50 MG tablet Take 51m in the a.m.and 216min the pm    . mirtazapine (REMERON) 30 MG tablet Take 30 mg by mouth at bedtime.    . Multiple Vitamin (MULTIVITAMIN) tablet Take 1 tablet by mouth daily. Reported on 02/06/2016    . Omega-3 Fatty Acids (FISH OIL) 1000 MG CAPS Take 1,000 mg by mouth daily.    . pantoprazole (PROTONIX) 40 MG tablet Take 1 tablet (40 mg total) by mouth daily. 30 tablet 3  . psyllium (METAMUCIL SMOOTH TEXTURE) 28 % packet Take 1 packet by mouth daily.     . Marland Kitcheniotropium (SPIRIVA) 18 MCG inhalation capsule Place 18 mcg into inhaler and inhale daily.     . Marland KitchenRSO FORTE 500 MG tablet TAKE 1 TABLET TWICE A DAY WITH MEALS 180 tablet 3   No current  facility-administered medications for this visit.   Outpatient Encounter Prescriptions as of 04/10/2016  Medication Sig  . ALPRAZolam (XANAX) 0.25 MG tablet Take 0.25 mg by mouth 2 (two) times daily as needed for anxiety.  . Marland Kitchentorvastatin (LIPITOR) 10 MG tablet Take 1 tablet (10 mg total) by mouth daily.  . cholecalciferol (VITAMIN D) 1000 UNITS tablet Take 1,000 Units by mouth every morning.   . citalopram (CELEXA) 10 MG tablet Take 1 tablet (10 mg total) by mouth daily.  . clopidogrel (PLAVIX) 75 MG tablet Take 75 mg by mouth daily.  . Marland Kitchenocusate sodium (COLACE) 100 MG capsule Take 100 mg by mouth 2 (two) times daily.  .Marland Kitchen  feeding supplement, GLUCERNA SHAKE, (GLUCERNA SHAKE) LIQD Take 237 mLs by mouth daily.  . furosemide (LASIX) 40 MG tablet Take 1.5 tablets (60 mg total) by mouth daily.  Marland Kitchen guaiFENesin (MUCINEX) 600 MG 12 hr tablet Take 1 tablet (600 mg total) by mouth 2 (two) times daily.  . hydrALAZINE (APRESOLINE) 25 MG tablet Take 1 tablet (25 mg total) by mouth 2 (two) times daily.  . insulin aspart (NOVOLOG FLEXPEN) 100 UNIT/ML FlexPen Inject 5 Units into the skin 3 (three) times daily with meals. When BG is over 100  . Insulin Glargine (LANTUS SOLOSTAR) 100 UNIT/ML Solostar Pen Inject 8 Units into the skin at bedtime.  Marland Kitchen loratadine (CLARITIN) 10 MG tablet Take 10 mg by mouth daily.  Marland Kitchen losartan (COZAAR) 25 MG tablet Take 25 mg by mouth daily.  . metFORMIN (GLUCOPHAGE) 500 MG tablet Take by mouth daily with breakfast.  . Methylfol-Algae-B12-Acetylcyst (CEREFOLIN NAC) 6-90.314-2-600 MG TABS Take 1 tablet by mouth daily.   . metoprolol (LOPRESSOR) 50 MG tablet Take 74m in the a.m.and 253min the pm  . mirtazapine (REMERON) 30 MG tablet Take 30 mg by mouth at bedtime.  . Multiple Vitamin (MULTIVITAMIN) tablet Take 1 tablet by mouth daily. Reported on 02/06/2016  . Omega-3 Fatty Acids (FISH OIL) 1000 MG CAPS Take 1,000 mg by mouth daily.  . pantoprazole (PROTONIX) 40 MG tablet Take 1 tablet (40  mg total) by mouth daily.  . psyllium (METAMUCIL SMOOTH TEXTURE) 28 % packet Take 1 packet by mouth daily.   . Marland Kitcheniotropium (SPIRIVA) 18 MCG inhalation capsule Place 18 mcg into inhaler and inhale daily.   . Marland KitchenRSO FORTE 500 MG tablet TAKE 1 TABLET TWICE A DAY WITH MEALS  . [DISCONTINUED] azithromycin (ZITHROMAX) 250 MG tablet Take by mouth daily. Reported on 04/10/2016  . [DISCONTINUED] benzonatate (TESSALON) 100 MG capsule Take by mouth 3 (three) times daily as needed for cough. Reported on 04/10/2016  . [DISCONTINUED] docusate sodium 100 MG CAPS Take 100 mg by mouth 2 (two) times daily. (Patient not taking: Reported on 04/10/2016)  . [DISCONTINUED] naproxen (NAPROSYN) 500 MG tablet Take 250 mg by mouth 2 (two) times daily with a meal. Reported on 04/10/2016   No facility-administered encounter medications on file as of 04/10/2016.    Allergies  Allergen Reactions  . Aricept [Donepezil Hcl]     Hives, vomiting and headache  . Namenda [Memantine Hcl]     hives  . Donepezil Nausea Only  . Spironolactone Other (See Comments)    Dizziness, balance problems  . Codeine Rash  . Indocin [Indomethacin] Rash  . Latex Rash  . Penicillins Rash    Social History   Social History  . Marital Status: Divorced    Spouse Name: N/A  . Number of Children: N/A  . Years of Education: N/A   Occupational History  . RETIRED FACTORY WORKER     [employer]   Social History Main Topics  . Smoking status: Former Smoker -- 0.50 packs/day for 40 years    Types: Cigarettes    Quit date: 03/30/2013  . Smokeless tobacco: Not on file     Comment: Quit since 03/2013  . Alcohol Use: No  . Drug Use: No  . Sexual Activity: No   Other Topics Concern  . Not on file   Social History Narrative   Occupation [employer]-took care of children   Retired   Divorced  And widowed in 2010- did not  Get along with spouse   Tobacco  abuse h/or-strong history,1/2 ppd for 50 years stopped 5 16 2011. Started age 65.   Drug  use -no   Alcohol use - no   Lives with son   Education - 2 th grade                   Family History  Problem Relation Age of Onset  . Heart failure Mother   . Cirrhosis Brother   . Lung cancer Father     Brain METS  . Kidney cancer Mother   . Kidney cancer Sister   . Heart attack Neg Hx   . Stroke Neg Hx   . Cancer Mother   . Cancer Father   . Cancer Sister   . Hypertension Sister   . Hypertension Mother     Review of Systems:  As stated in the HPI and otherwise negative.   BP 120/88 mmHg  Pulse 60  Ht _0  (1.626 m)  Wt 159 lb 6.4 oz (72.303 kg)  BMI 27.35 kg/m2  SpO2 90%  Physical Examination: General: Well developed, well nourished, NAD HEENT: OP clear, mucus membranes moist SKIN: warm, dry. No rashes. Neuro: No focal deficits Musculoskeletal: Muscle strength 5/5 all ext Psychiatric: Mood and affect normal Neck: No JVD, no carotid bruits, no thyromegaly, no lymphadenopathy. Lungs:Clear bilaterally, no wheezes, rhonci, crackles Cardiovascular: Regular rate and rhythm. No murmurs, gallops or rubs. Abdomen:Soft. Bowel sounds present. Non-tender.  Extremities: No lower extremity edema. Pulses are 2 + in the bilateral DP/PT.  Echo July 2016: Left ventricle: The cavity size was normal. Wall thickness was  increased in a pattern of moderate LVH. Systolic function was  vigorous. The estimated ejection fraction was in the range of 65%  to 70%. Abnormal diastolic function, indeterminate grade. - Aortic valve: Mildly calcified annulus. Trileaflet; mildly  thickened leaflets. Valve area (VTI): 2.13 cm^2. Valve area  (Vmax): 2.32 cm^2. - Mitral valve: Mildly calcified annulus. Mildly thickened leaflets  EKG:  EKG is not ordered today. The ekg ordered today demonstrates   Recent Labs: 12/02/2015: Magnesium 1.8; TSH 8.882* 01/02/2016: ALT 15 02/04/2016: B Natriuretic Peptide 333.0*; BUN 15; Creatinine, Ser 0.70; Hemoglobin 14.3; Platelets 205; Potassium  3.6; Sodium 139   Lipid Panel    Component Value Date/Time   CHOL 165 01/02/2016 1100   TRIG 106 01/02/2016 1100   HDL 58 01/02/2016 1100   CHOLHDL 2.8 01/02/2016 1100   VLDL 21 01/02/2016 1100   LDLCALC 86 01/02/2016 1100     Wt Readings from Last 3 Encounters:  04/10/16 159 lb 6.4 oz (72.303 kg)  02/06/16 159 lb 6.4 oz (72.303 kg)  02/04/16 158 lb (71.668 kg)     Other studies Reviewed: Additional studies/ records that were reviewed today include:. Review of the above records demonstrates:    Assessment and Plan:   1. CAD: Mild CAD by cath May 2011. Nuclear stress test January 2017 with no ischemia. She is on a statin, ASA and Ace-inhibitor. No changes in therapy.     2. HYPERTENSION:  BP controlled today. No changes  3. Dyspnea: Stable. Likely from her COPD.   4. Tobacco abuse: She has stopped smoking.   5. Aortic insufficiency: No significant regurgitation seen on echo July 2016.  6. SVT: No recurrence. Continue metoprolol 50 mg po in the am, 25 mg po in the pm.   7. Weakness, fatigue: Will need repeat thyroid testing in primary care next week. I have given them the results from our office  in January.   Current medicines are reviewed at length with the patient today.  The patient does not have concerns regarding medicines.  The following changes have been made:  no change  Labs/ tests ordered today include: No orders of the defined types were placed in this encounter.    Disposition:   FU with me in 12 months  Signed, Lauree Chandler, MD 04/10/2016 3:24 PM    [facility] [place], [place], [place]  [address] Phone: [phone]; Fax: [phone]

## 2016-04-10 NOTE — Patient Instructions (Signed)
Medication Instructions:  Your physician recommends that you continue on your current medications as directed. Please refer to the Current Medication list given to you today.   Labwork: none  Testing/Procedures: none  Follow-Up: Your physician wants you to follow-up in: 12 months.  You will receive a reminder letter in the mail two months in advance. If you don't receive a letter, please call our office to schedule the follow-up appointment.   Any Other Special Instructions Will Be Listed Below (If Applicable).     If you need a refill on your cardiac medications before your next appointment, please call your pharmacy.

## 2016-04-14 DIAGNOSIS — E119 Type 2 diabetes mellitus without complications: Secondary | ICD-10-CM | POA: Diagnosis not present

## 2016-04-14 DIAGNOSIS — I10 Essential (primary) hypertension: Secondary | ICD-10-CM | POA: Diagnosis not present

## 2016-04-14 DIAGNOSIS — I5030 Unspecified diastolic (congestive) heart failure: Secondary | ICD-10-CM | POA: Diagnosis not present

## 2016-04-14 DIAGNOSIS — E039 Hypothyroidism, unspecified: Secondary | ICD-10-CM | POA: Diagnosis not present

## 2016-04-15 DIAGNOSIS — F419 Anxiety disorder, unspecified: Secondary | ICD-10-CM | POA: Diagnosis not present

## 2016-04-15 DIAGNOSIS — E119 Type 2 diabetes mellitus without complications: Secondary | ICD-10-CM | POA: Diagnosis not present

## 2016-04-15 DIAGNOSIS — Z794 Long term (current) use of insulin: Secondary | ICD-10-CM | POA: Diagnosis not present

## 2016-04-15 DIAGNOSIS — I5033 Acute on chronic diastolic (congestive) heart failure: Secondary | ICD-10-CM | POA: Diagnosis not present

## 2016-04-15 DIAGNOSIS — E039 Hypothyroidism, unspecified: Secondary | ICD-10-CM | POA: Diagnosis not present

## 2016-04-15 DIAGNOSIS — E785 Hyperlipidemia, unspecified: Secondary | ICD-10-CM | POA: Diagnosis not present

## 2016-04-22 DIAGNOSIS — E1351 Other specified diabetes mellitus with diabetic peripheral angiopathy without gangrene: Secondary | ICD-10-CM | POA: Diagnosis not present

## 2016-04-22 DIAGNOSIS — L602 Onychogryphosis: Secondary | ICD-10-CM | POA: Diagnosis not present

## 2016-04-22 DIAGNOSIS — L84 Corns and callosities: Secondary | ICD-10-CM | POA: Diagnosis not present

## 2016-04-23 DIAGNOSIS — E119 Type 2 diabetes mellitus without complications: Secondary | ICD-10-CM | POA: Diagnosis not present

## 2016-04-23 DIAGNOSIS — E039 Hypothyroidism, unspecified: Secondary | ICD-10-CM | POA: Diagnosis not present

## 2016-04-23 DIAGNOSIS — I5033 Acute on chronic diastolic (congestive) heart failure: Secondary | ICD-10-CM | POA: Diagnosis not present

## 2016-04-23 DIAGNOSIS — F419 Anxiety disorder, unspecified: Secondary | ICD-10-CM | POA: Diagnosis not present

## 2016-04-23 DIAGNOSIS — Z794 Long term (current) use of insulin: Secondary | ICD-10-CM | POA: Diagnosis not present

## 2016-04-23 DIAGNOSIS — E785 Hyperlipidemia, unspecified: Secondary | ICD-10-CM | POA: Diagnosis not present

## 2016-05-13 DIAGNOSIS — R05 Cough: Secondary | ICD-10-CM | POA: Diagnosis not present

## 2016-05-13 DIAGNOSIS — R0602 Shortness of breath: Secondary | ICD-10-CM | POA: Diagnosis not present

## 2016-05-13 DIAGNOSIS — I5030 Unspecified diastolic (congestive) heart failure: Secondary | ICD-10-CM | POA: Diagnosis not present

## 2016-05-17 ENCOUNTER — Emergency Department (HOSPITAL_COMMUNITY): Payer: Medicare Other

## 2016-05-17 ENCOUNTER — Encounter (HOSPITAL_COMMUNITY): Payer: Self-pay

## 2016-05-17 ENCOUNTER — Emergency Department (HOSPITAL_COMMUNITY)
Admission: EM | Admit: 2016-05-17 | Discharge: 2016-05-17 | Disposition: A | Discharge status: Home / Self Care | Payer: Medicare Other | Attending: Emergency Medicine | Admitting: Emergency Medicine

## 2016-05-17 ENCOUNTER — Encounter (HOSPITAL_COMMUNITY): Payer: Self-pay | Admitting: Emergency Medicine

## 2016-05-17 DIAGNOSIS — I5020 Unspecified systolic (congestive) heart failure: Secondary | ICD-10-CM | POA: Diagnosis not present

## 2016-05-17 DIAGNOSIS — Z794 Long term (current) use of insulin: Secondary | ICD-10-CM | POA: Diagnosis not present

## 2016-05-17 DIAGNOSIS — Y929 Unspecified place or not applicable: Secondary | ICD-10-CM | POA: Insufficient documentation

## 2016-05-17 DIAGNOSIS — E785 Hyperlipidemia, unspecified: Secondary | ICD-10-CM | POA: Insufficient documentation

## 2016-05-17 DIAGNOSIS — F329 Major depressive disorder, single episode, unspecified: Secondary | ICD-10-CM | POA: Diagnosis not present

## 2016-05-17 DIAGNOSIS — S41101A Unspecified open wound of right upper arm, initial encounter: Secondary | ICD-10-CM | POA: Insufficient documentation

## 2016-05-17 DIAGNOSIS — I5021 Acute systolic (congestive) heart failure: Secondary | ICD-10-CM | POA: Diagnosis not present

## 2016-05-17 DIAGNOSIS — Z7984 Long term (current) use of oral hypoglycemic drugs: Secondary | ICD-10-CM | POA: Diagnosis not present

## 2016-05-17 DIAGNOSIS — X58XXXA Exposure to other specified factors, initial encounter: Secondary | ICD-10-CM | POA: Insufficient documentation

## 2016-05-17 DIAGNOSIS — Y939 Activity, unspecified: Secondary | ICD-10-CM | POA: Insufficient documentation

## 2016-05-17 DIAGNOSIS — I2510 Atherosclerotic heart disease of native coronary artery without angina pectoris: Secondary | ICD-10-CM | POA: Insufficient documentation

## 2016-05-17 DIAGNOSIS — I110 Hypertensive heart disease with heart failure: Secondary | ICD-10-CM | POA: Diagnosis not present

## 2016-05-17 DIAGNOSIS — Z87891 Personal history of nicotine dependence: Secondary | ICD-10-CM | POA: Diagnosis not present

## 2016-05-17 DIAGNOSIS — E119 Type 2 diabetes mellitus without complications: Secondary | ICD-10-CM | POA: Insufficient documentation

## 2016-05-17 DIAGNOSIS — R58 Hemorrhage, not elsewhere classified: Secondary | ICD-10-CM

## 2016-05-17 DIAGNOSIS — S5001XA Contusion of right elbow, initial encounter: Secondary | ICD-10-CM | POA: Diagnosis not present

## 2016-05-17 DIAGNOSIS — I10 Essential (primary) hypertension: Secondary | ICD-10-CM | POA: Insufficient documentation

## 2016-05-17 DIAGNOSIS — R05 Cough: Secondary | ICD-10-CM | POA: Diagnosis not present

## 2016-05-17 DIAGNOSIS — Z79899 Other long term (current) drug therapy: Secondary | ICD-10-CM | POA: Insufficient documentation

## 2016-05-17 DIAGNOSIS — Y999 Unspecified external cause status: Secondary | ICD-10-CM | POA: Insufficient documentation

## 2016-05-17 LAB — CBC WITH DIFFERENTIAL/PLATELET
Basophils Absolute: 0.1 10*3/uL (ref 0.0–0.1)
Basophils Relative: 2 %
Eosinophils Absolute: 0.2 10*3/uL (ref 0.0–0.7)
Eosinophils Relative: 4 %
HCT: 39 % (ref 36.0–46.0)
Hemoglobin: 13.4 g/dL (ref 12.0–15.0)
Lymphocytes Relative: 40 %
Lymphs Abs: 1.4 10*3/uL (ref 0.7–4.0)
MCH: 34.1 pg — ABNORMAL HIGH (ref 26.0–34.0)
MCHC: 34.4 g/dL (ref 30.0–36.0)
MCV: 99.2 fL (ref 78.0–100.0)
Monocytes Absolute: 0.3 10*3/uL (ref 0.1–1.0)
Monocytes Relative: 7 %
Neutro Abs: 1.7 10*3/uL (ref 1.7–7.7)
Neutrophils Relative %: 47 %
Platelets: 223 10*3/uL (ref 150–400)
RBC: 3.93 MIL/uL (ref 3.87–5.11)
RDW: 13.1 % (ref 11.5–15.5)
WBC: 3.7 10*3/uL — ABNORMAL LOW (ref 4.0–10.5)

## 2016-05-17 LAB — COMPREHENSIVE METABOLIC PANEL
ALT: 26 U/L (ref 14–54)
AST: 39 U/L (ref 15–41)
Albumin: 3.7 g/dL (ref 3.5–5.0)
Alkaline Phosphatase: 135 U/L — ABNORMAL HIGH (ref 38–126)
Anion gap: 11 (ref 5–15)
BUN: 12 mg/dL (ref 6–20)
CO2: 25 mmol/L (ref 22–32)
Calcium: 8.9 mg/dL (ref 8.9–10.3)
Chloride: 96 mmol/L — ABNORMAL LOW (ref 101–111)
Creatinine, Ser: 0.56 mg/dL (ref 0.44–1.00)
GFR calc Af Amer: >60 mL/min (ref >60)
GFR calc non Af Amer: >60 mL/min (ref >60)
Glucose, Bld: 148 mg/dL — ABNORMAL HIGH (ref 65–99)
Potassium: 3.5 mmol/L (ref 3.5–5.1)
Sodium: 132 mmol/L — ABNORMAL LOW (ref 135–145)
Total Bilirubin: 1 mg/dL (ref 0.3–1.2)
Total Protein: 6.9 g/dL (ref 6.5–8.1)

## 2016-05-17 LAB — BRAIN NATRIURETIC PEPTIDE: B Natriuretic Peptide: 785 pg/mL — ABNORMAL HIGH (ref 0.0–100.0)

## 2016-05-17 LAB — TROPONIN I: Troponin I: <0.03 ng/mL (ref <0.03)

## 2016-05-17 MED ORDER — FUROSEMIDE 40 MG PO TABS
40.0000 mg | ORAL_TABLET | Freq: Once | ORAL | Status: AC
  Administered 2016-05-17: 40 mg via ORAL
  Filled 2016-05-17: qty 1

## 2016-05-17 MED ORDER — SILVER NITRATE-POT NITRATE 75-25 % EX MISC
CUTANEOUS | Status: AC
  Administered 2016-05-17: 21:00:00
  Filled 2016-05-17: qty 1

## 2016-05-17 NOTE — ED Provider Notes (Signed)
Patient's BMP was slightly elevated. Family states that patient is usually on 2 L at home. Has been using it almost all the time. On 2 L in the emergency department she is now 97%. Patient will be given an extra 40 mg of Lasix and she will then go home and increase her Lasix from 60 mg a day to 80 mg a day.  Patient will follow-up with her doctor on Wednesday  Milton Ferguson, MD 05/17/16 1651

## 2016-05-17 NOTE — ED Notes (Signed)
Pressure dressing applied. Will continue to monitor.

## 2016-05-17 NOTE — Discharge Instructions (Signed)
Remove bandage tomorrow and follow up tomorrow if any problems

## 2016-05-17 NOTE — ED Provider Notes (Signed)
CSN: [id number]     Arrival date & time 05/17/16  2000 History  By signing my name below, I, [name], attest that this documentation has been prepared under the direction and in the presence of Michele Ferguson, MD. Electronically Signed: Virgel Chambers, ED Scribe. 05/17/2016. 9:34 PM.   Chief Complaint  Patient presents with  . Coagulation Disorder    The history is provided by the patient and a relative. No language interpreter was used.   HPI Comments: [name] is a 82 y.o. female with a hx of DM, CAD, coronary atherosclerosis, CHF, PSVT, and chronic liver disease who presents to the Emergency Department complaining of constant, mild, unchanging bleeding from the crease of her right elbow ongoing for the past 4 hours. Pt states that she was seen here in the [facility] ED earlier today for a cough and has been bleeding from her right inner elbow from the site of the IV insertion since the IV was removed. Daughter reports that each time the pt coughs bleeding increases from the affected area. She regularly takes Plavix. Denies any other symptoms currently.  Past Medical History  Diagnosis Date  . History of GI bleed   . Parotid tumor   . History of breast cancer   . Type 2 diabetes mellitus ([place])   . Osteopenia   . Essential hypertension, benign   . Hyperlipidemia   . Depression   . Anxiety   . Chronic headaches   . Carotid artery disease (HCC)     Bilateral ICA occlusion  . Orthostatic hypotension     Diabetic polyneuropathy/diabetic dysautonomia   . Coronary atherosclerosis of native coronary artery     Mild nonobstructive disease at cardiac catheterization May 2011  . PSVT (paroxysmal supraventricular tachycardia) ([place])   . Acute diastolic congestive heart failure ([place]) 03/21/2014  . Fracture of femoral neck, right, closed 03/28/2014  . Acute delirium 06/23/2013  . Syncope and collapse 04/15/2010    Qualifier: Diagnosis of  By: Michele Form, MD, [name] Gave    .  Pressure ulcer, heel, right, unstageable ([place]) 04/17/2014  . UPPER GASTROINTESTINAL HEMORRHAGE 03/21/2007    Qualifier: Diagnosis of  By: Jonna Munro MD, Roderic Scarce    . Chronic liver disease     ?cirrhosis on 10/2014 CT. H/O elevated alkphos and +AMA on URSO.   Past Surgical History  Procedure Laterality Date  . Mastectomy  1994    Left breast -related to breast cancer  . Vesicovaginal fistula closure w/ tah  1979  . Cholecystectomy  2008  . Hemorrhoid surgery  1960s  . Laparoscopic appendectomy N/A 04/13/2013    Procedure: APPENDECTOMY LAPAROSCOPIC;  Surgeon: Donato Heinz, MD;  Location: [facility];  Service: General;  Laterality: N/A;  . Colonoscopy  2010    Dr. Oneida Alar: single tubular adenoma, one hyperplastic polyp. Next TCS 2020 health permitting.  . Hip arthroplasty Right 03/28/2014    Procedure: ARTHROPLASTY BIPOLAR HIP;  Surgeon: Carole Civil, MD;  Location: [facility]S;  Service: Orthopedics;  Laterality: Right;  . Abdominal hysterectomy    . Appendectomy     Family History  Problem Relation Age of Onset  . Heart failure Mother   . Cirrhosis Brother   . Lung cancer Father     Brain METS  . Kidney cancer Mother   . Kidney cancer Sister   . Heart attack Neg Hx   . Stroke Neg Hx   . Cancer Mother   . Cancer Father   . Cancer Sister   .  Hypertension Sister   . Hypertension Mother    Social History  Substance Use Topics  . Smoking status: Former Smoker -- 0.50 packs/day for 40 years    Types: Cigarettes    Quit date: 03/30/2013  . Smokeless tobacco: None     Comment: Quit since 03/2013  . Alcohol Use: No   OB History    No data available     Review of Systems  Constitutional: Negative for fever.  Skin: Positive for wound.      Allergies  Aricept; Namenda; Donepezil; Spironolactone; Codeine; Indocin; Latex; and Penicillins  Home Medications   Prior to Admission medications   Medication Sig Start Date End Date Taking? Authorizing Provider  ALPRAZolam (XANAX)  0.25 MG tablet Take 0.25 mg by mouth 2 (two) times daily as needed for anxiety.    Historical Provider, MD  atorvastatin (LIPITOR) 10 MG tablet Take 1 tablet (10 mg total) by mouth daily. 02/20/14   Michele Canterbury, MD  cholecalciferol (VITAMIN D) 1000 UNITS tablet Take 1,000 Units by mouth every morning.     Historical Provider, MD  citalopram (CELEXA) 10 MG tablet Take 1 tablet (10 mg total) by mouth daily. 04/20/14   Michele Dike, MD  clopidogrel (PLAVIX) 75 MG tablet Take 75 mg by mouth daily.    Historical Provider, MD  docusate sodium (COLACE) 100 MG capsule Take 100 mg by mouth 2 (two) times daily.    Historical Provider, MD  feeding supplement, GLUCERNA SHAKE, (GLUCERNA SHAKE) LIQD Take 237 mLs by mouth daily.    Historical Provider, MD  furosemide (LASIX) 40 MG tablet Take 1.5 tablets (60 mg total) by mouth daily. 02/18/16   Minus Breeding, MD  guaiFENesin (MUCINEX) 600 MG 12 hr tablet Take 1 tablet (600 mg total) by mouth 2 (two) times daily. 12/26/15   Michele Dike, MD  hydrALAZINE (APRESOLINE) 25 MG tablet Take 1 tablet (25 mg total) by mouth 2 (two) times daily. 12/16/15   Michele Burn, PA-C  insulin aspart (NOVOLOG FLEXPEN) 100 UNIT/ML FlexPen Inject 5 Units into the skin 3 (three) times daily with meals. When BG is over 100    Historical Provider, MD  Insulin Glargine (LANTUS SOLOSTAR) 100 UNIT/ML Solostar Pen Inject 8 Units into the skin at bedtime.    Historical Provider, MD  levothyroxine (SYNTHROID, LEVOTHROID) 25 MCG tablet Take 1 tablet by mouth daily. 04/14/16   Historical Provider, MD  lidocaine (XYLOCAINE) 5 % ointment APPLY 2-3 GRAMS (1 GRAM=1 INCH) TO AFFECTED AREA 4 TIMES DAILY 04/20/16   Historical Provider, MD  loratadine (CLARITIN) 10 MG tablet Take 10 mg by mouth daily.    Historical Provider, MD  losartan (COZAAR) 25 MG tablet Take 25 mg by mouth daily.    Historical Provider, MD  metFORMIN (GLUCOPHAGE-XR) 500 MG 24 hr tablet Take 250 mg by mouth daily with supper.      Historical Provider, MD  Methylfol-Algae-B12-Acetylcyst (CEREFOLIN NAC) 6-90.314-2-600 MG TABS Take 1 tablet by mouth daily.     Historical Provider, MD  metoprolol (LOPRESSOR) 50 MG tablet Take 30m in the a.m.and 230min the pm 12/16/15   MiImogene BurnPA-C  mirtazapine (REMERON) 30 MG tablet Take 30 mg by mouth at bedtime.    Historical Provider, MD  Multiple Vitamin (MULTIVITAMIN) tablet Take 1 tablet by mouth daily. Reported on 02/06/2016    Historical Provider, MD  naproxen (NAPROSYN) 500 MG tablet Take 1 tablet by mouth 2 (two) times daily. 04/14/16   Historical Provider, MD  Omega-3 Fatty Acids (FISH OIL) 1000 MG CAPS Take 1,000 mg by mouth daily.    Historical Provider, MD  pantoprazole (PROTONIX) 40 MG tablet Take 1 tablet (40 mg total) by mouth daily. 08/31/14   Orvil Feil, NP  psyllium (METAMUCIL SMOOTH TEXTURE) 28 % packet Take 1 packet by mouth daily.     Historical Provider, MD  tiotropium (SPIRIVA) 18 MCG inhalation capsule Place 18 mcg into inhaler and inhale daily.     Historical Provider, MD  URSO FORTE 500 MG tablet TAKE 1 TABLET TWICE A DAY WITH MEALS 10/08/15   Mahala Menghini, PA-C   BP 153/73 mmHg  Pulse 63  Temp(Src) 98.4 F (36.9 C) (Oral)  Resp 20  SpO2 92% Physical Exam  Constitutional: She is oriented to person, place, and time. She appears well-developed.  HENT:  Head: Normocephalic.  Eyes: Conjunctivae are normal.  Neck: No tracheal deviation present.  Cardiovascular:  No murmur heard. Musculoskeletal: Normal range of motion.  Neurological: She is oriented to person, place, and time.  Skin: Skin is warm.  Pinpoint bleeding at the antecubital space of the right arm.  Psychiatric: She has a normal mood and affect.    ED Course  .Marland KitchenLaceration Repair Date/Time: 05/17/2016 9:35 PM Performed by: [name] Authorized by: [name] Comments: Patient with bleeding from a venous stick to the right antecubital space earlier today. Area was cleaned  thoroughly and silver nitrate stick was applied to stop the bleeding. Patient also had a pressure dressing applied    DIAGNOSTIC STUDIES: Oxygen Saturation is 92% on RA, low by my interpretation.    COORDINATION OF CARE: 8:38 PM Applied pressure and silver nitrate to stop bleeding. Will re-bandage pt's right arm. Will monitor in ED. Discussed treatment plan with pt at bedside and pt agreed to plan.  9:33 PM Spoke with pt's nurse who stated that the pt's bleeding has improved. Will discharge pt.  Labs Review Labs Reviewed - No data to display  Imaging Review Dg Chest 2 View  05/17/2016  CLINICAL DATA:  Productive cough and weakness with shortness-of-breath. EXAM: CHEST  2 VIEW COMPARISON:  05/13/2016, 02/04/2016 and 12/24/2015 FINDINGS: Lungs are somewhat hyperexpanded with mild flattening of the hemidiaphragms. Mild increased perihilar markings without significant change. Slight asymmetric increased markings over the right base unchanged. Chronic blunting of the right costophrenic angle. Mild stable cardiomegaly. Remainder of the exam is unchanged. IMPRESSION: Stable cardiomegaly with findings suggesting chronic stable vascular congestion. Electronically Signed   By: Marin Olp M.D.   On: 05/17/2016 14:21   I have personally reviewed and evaluated these images and lab results as part of my medical decision-making.   EKG Interpretation None      MDM   Final diagnoses:  None   Please control with bandage to right arm. She is to follow-up with her doctor if problems.   The chart was scribed for me under my direct supervision.  I personally performed the history, physical, and medical decision making and all procedures in the evaluation of this patient.Michele Ferguson, MD 05/17/16 2136

## 2016-05-17 NOTE — ED Notes (Signed)
Pressure dsg remains clean,dry,and intact.

## 2016-05-17 NOTE — Discharge Instructions (Signed)
Increase her Lasix to 80 mg a day. Continue using her oxygen at 2 L. Follow-up Wednesday as planned with her doctor

## 2016-05-17 NOTE — ED Notes (Signed)
Pt daughter reports increased productive cough and weakness. Pt seen by pcp last week and told she had increase in chf. Pt has gained eight over the weekend and stomach appears more distended per family.

## 2016-05-17 NOTE — ED Provider Notes (Signed)
CSN: [id number]     Arrival date & time 05/17/16  1321 History   First MD Initiated Contact with Patient 05/17/16 1336     Chief Complaint  Patient presents with  . Cough      Patient is a 82 y.o. female presenting with cough. The history is provided by the patient.  Cough Associated symptoms: chills and shortness of breath   Associated symptoms: no chest pain and no fever   Patient presents with shortness of breath and cough. States she had a cough since admission to hospital a few months ago. States it improved somewhat but then returned. States she went to primary care doctor's office 4 days ago. States she was told she had CHF but did not have her medications adjusted. She has had sputum that is changed from clear to yellow to green and now has little bit of blood in it. Slight swelling or legs. Her weight had gone up but has come down the last couple days. No abdominal pain. States she feels weak. Initial oxygen saturation of 88% upon arrival. She is on Plavix. She has a history of heart failure.  Past Medical History  Diagnosis Date  . History of GI bleed   . Parotid tumor   . History of breast cancer   . Type 2 diabetes mellitus ([place])   . Osteopenia   . Essential hypertension, benign   . Hyperlipidemia   . Depression   . Anxiety   . Chronic headaches   . Carotid artery disease (HCC)     Bilateral ICA occlusion  . Orthostatic hypotension     Diabetic polyneuropathy/diabetic dysautonomia   . Coronary atherosclerosis of native coronary artery     Mild nonobstructive disease at cardiac catheterization May 2011  . PSVT (paroxysmal supraventricular tachycardia) ([place])   . Acute diastolic congestive heart failure ([place]) 03/21/2014  . Fracture of femoral neck, right, closed 03/28/2014  . Acute delirium 06/23/2013  . Syncope and collapse 04/15/2010    Qualifier: Diagnosis of  By: Angelena Form, MD, Harrell Gave    . Pressure ulcer, heel, right, unstageable ([place]) 04/17/2014  . UPPER  GASTROINTESTINAL HEMORRHAGE 03/21/2007    Qualifier: Diagnosis of  By: Jonna Munro MD, Roderic Scarce    . Chronic liver disease     ?cirrhosis on 10/2014 CT. H/O elevated alkphos and +AMA on URSO.   Past Surgical History  Procedure Laterality Date  . Mastectomy  1994    Left breast -related to breast cancer  . Vesicovaginal fistula closure w/ tah  1979  . Cholecystectomy  2008  . Hemorrhoid surgery  1960s  . Laparoscopic appendectomy N/A 04/13/2013    Procedure: APPENDECTOMY LAPAROSCOPIC;  Surgeon: Donato Heinz, MD;  Location: [facility];  Service: General;  Laterality: N/A;  . Colonoscopy  2010    Dr. Oneida Alar: single tubular adenoma, one hyperplastic polyp. Next TCS 2020 health permitting.  . Hip arthroplasty Right 03/28/2014    Procedure: ARTHROPLASTY BIPOLAR HIP;  Surgeon: Carole Civil, MD;  Location: [facility];  Service: Orthopedics;  Laterality: Right;  . Abdominal hysterectomy    . Appendectomy     Family History  Problem Relation Age of Onset  . Heart failure Mother   . Cirrhosis Brother   . Lung cancer Father     Brain METS  . Kidney cancer Mother   . Kidney cancer Sister   . Heart attack Neg Hx   . Stroke Neg Hx   . Cancer Mother   . Cancer Father   .  Cancer Sister   . Hypertension Sister   . Hypertension Mother    Social History  Substance Use Topics  . Smoking status: Former Smoker -- 0.50 packs/day for 40 years    Types: Cigarettes    Quit date: 03/30/2013  . Smokeless tobacco: None     Comment: Quit since 03/2013  . Alcohol Use: No   OB History    No data available     Review of Systems  Constitutional: Positive for chills, appetite change and fatigue. Negative for fever.  Respiratory: Positive for cough and shortness of breath.   Cardiovascular: Positive for leg swelling. Negative for chest pain.  Gastrointestinal: Negative for abdominal pain.  Genitourinary: Negative for dysuria.  Musculoskeletal: Negative for back pain.  Skin: Negative for wound.   Neurological: Negative for speech difficulty.  Hematological: Negative for adenopathy.      Allergies  Aricept; Namenda; Donepezil; Spironolactone; Codeine; Indocin; Latex; and Penicillins  Home Medications   Prior to Admission medications   Medication Sig Start Date End Date Taking? Authorizing Provider  ALPRAZolam (XANAX) 0.25 MG tablet Take 0.25 mg by mouth 2 (two) times daily as needed for anxiety.   Yes Historical Provider, MD  atorvastatin (LIPITOR) 10 MG tablet Take 1 tablet (10 mg total) by mouth daily. 02/20/14  Yes [name], MD  cholecalciferol (VITAMIN D) 1000 UNITS tablet Take 1,000 Units by mouth every morning.    Yes Historical Provider, MD  citalopram (CELEXA) 10 MG tablet Take 1 tablet (10 mg total) by mouth daily. 04/20/14  Yes Kathie Dike, MD  clopidogrel (PLAVIX) 75 MG tablet Take 75 mg by mouth daily.   Yes Historical Provider, MD  docusate sodium (COLACE) 100 MG capsule Take 100 mg by mouth 2 (two) times daily.   Yes Historical Provider, MD  feeding supplement, GLUCERNA SHAKE, (GLUCERNA SHAKE) LIQD Take 237 mLs by mouth daily.   Yes Historical Provider, MD  furosemide (LASIX) 40 MG tablet Take 1.5 tablets (60 mg total) by mouth daily. 02/18/16  Yes Minus Breeding, MD  guaiFENesin (MUCINEX) 600 MG 12 hr tablet Take 1 tablet (600 mg total) by mouth 2 (two) times daily. 12/26/15  Yes Kathie Dike, MD  hydrALAZINE (APRESOLINE) 25 MG tablet Take 1 tablet (25 mg total) by mouth 2 (two) times daily. 12/16/15  Yes Imogene Burn, PA-C  insulin aspart (NOVOLOG FLEXPEN) 100 UNIT/ML FlexPen Inject 5 Units into the skin 3 (three) times daily with meals. When BG is over 100   Yes Historical Provider, MD  Insulin Glargine (LANTUS SOLOSTAR) 100 UNIT/ML Solostar Pen Inject 8 Units into the skin at bedtime.   Yes Historical Provider, MD  loratadine (CLARITIN) 10 MG tablet Take 10 mg by mouth daily.   Yes Historical Provider, MD  losartan (COZAAR) 25 MG tablet Take 25 mg by mouth  daily.   Yes Historical Provider, MD  metFORMIN (GLUCOPHAGE-XR) 500 MG 24 hr tablet Take 250 mg by mouth daily with breakfast.   Yes Historical Provider, MD  Methylfol-Algae-B12-Acetylcyst (CEREFOLIN NAC) 6-90.314-2-600 MG TABS Take 1 tablet by mouth daily.    Yes Historical Provider, MD  metoprolol (LOPRESSOR) 50 MG tablet Take 70m in the a.m.and 233min the pm 12/16/15  Yes MiImogene BurnPA-C  mirtazapine (REMERON) 30 MG tablet Take 30 mg by mouth at bedtime.   Yes Historical Provider, MD  Multiple Vitamin (MULTIVITAMIN) tablet Take 1 tablet by mouth daily. Reported on 02/06/2016   Yes Historical Provider, MD  Omega-3 Fatty Acids ([place]  OIL) 1000 MG CAPS Take 1,000 mg by mouth daily.   Yes Historical Provider, MD  pantoprazole (PROTONIX) 40 MG tablet Take 1 tablet (40 mg total) by mouth daily. 08/31/14  Yes Orvil Feil, NP  psyllium (METAMUCIL SMOOTH TEXTURE) 28 % packet Take 1 packet by mouth daily.    Yes Historical Provider, MD  tiotropium (SPIRIVA) 18 MCG inhalation capsule Place 18 mcg into inhaler and inhale daily.    Yes Historical Provider, MD  URSO FORTE 500 MG tablet TAKE 1 TABLET TWICE A DAY WITH MEALS 10/08/15  Yes Mahala Menghini, PA-C  metFORMIN (GLUCOPHAGE) 500 MG tablet Take by mouth daily with breakfast.    Historical Provider, MD   BP 140/92 mmHg  Pulse 65  Temp(Src) 98.4 F (36.9 C) (Oral)  Resp 22  Ht _0  (1.626 m)  Wt 157 lb (71.215 kg)  BMI 26.94 kg/m2  SpO2 88% Physical Exam  Constitutional: She appears well-developed and well-nourished.  HENT:  Head: Normocephalic.  Eyes: EOM are normal.  Neck: Neck supple.  Cardiovascular: Normal rate and regular rhythm.   Pulmonary/Chest:  Rales bilateral bases.  Abdominal: Soft. There is no tenderness.  Musculoskeletal: She exhibits edema.  Mild bilateral lower extremity pitting edema  Skin: Skin is warm.    ED Course  Procedures (including critical care time) Labs Review Labs Reviewed  COMPREHENSIVE METABOLIC  PANEL  BRAIN NATRIURETIC PEPTIDE  TROPONIN I  CBC WITH DIFFERENTIAL/PLATELET    Imaging Review No results found. I have personally reviewed and evaluated these images and lab results as part of my medical decision-making.   EKG Interpretation   Date/Time:  Sunday May 17 2016 13:37:56 EDT Ventricular Rate:  62 PR Interval:    QRS Duration: 96 QT Interval:  416 QTC Calculation: 423 R Axis:   -116 Text Interpretation:  Sinus rhythm Prolonged PR interval Biatrial  enlargement Right superior axis RVH with secondary repolarization abnrm ST  depression inferiorly Confirmed by Alvino Chapel  MD, Ovid Curd [phone]) on  05/17/2016 1:46:03 PM      MDM   Final diagnoses:  None  Patient presents for shortness and cough. Increased sputum production also. Found to be hypoxic on room air. X-ray shows stable vascular congestion. Lab work still pending. Will likely require admission.    Davonna Belling, MD 05/17/16 1501

## 2016-05-17 NOTE — ED Notes (Signed)
Several IV attempts have been made, including [place] IV. MD notified.

## 2016-05-17 NOTE — ED Notes (Signed)
Patient was seen as patient earlier today in ED. Patient had and IV removed and not been able to control the bleeding. Family member states "everytime she coughs, blood squirts out"  Dressing noted to right AC with visible blood.  Patient takes plavix.

## 2016-05-21 DIAGNOSIS — R05 Cough: Secondary | ICD-10-CM | POA: Diagnosis not present

## 2016-05-21 DIAGNOSIS — I10 Essential (primary) hypertension: Secondary | ICD-10-CM | POA: Diagnosis not present

## 2016-05-21 DIAGNOSIS — E039 Hypothyroidism, unspecified: Secondary | ICD-10-CM | POA: Diagnosis not present

## 2016-05-21 DIAGNOSIS — N393 Stress incontinence (female) (male): Secondary | ICD-10-CM | POA: Diagnosis not present

## 2016-05-25 ENCOUNTER — Other Ambulatory Visit (HOSPITAL_COMMUNITY): Payer: Self-pay | Admitting: Respiratory Therapy

## 2016-05-25 DIAGNOSIS — R059 Cough, unspecified: Secondary | ICD-10-CM

## 2016-05-25 DIAGNOSIS — R05 Cough: Secondary | ICD-10-CM

## 2016-06-05 ENCOUNTER — Ambulatory Visit (HOSPITAL_COMMUNITY)
Admission: RE | Admit: 2016-06-05 | Discharge: 2016-06-05 | Disposition: A | Discharge status: Home / Self Care | Payer: Medicare Other | Source: Ambulatory Visit | Attending: Internal Medicine | Admitting: Internal Medicine

## 2016-06-05 DIAGNOSIS — R05 Cough: Secondary | ICD-10-CM | POA: Insufficient documentation

## 2016-06-05 MED ORDER — ALBUTEROL SULFATE (2.5 MG/3ML) 0.083% IN NEBU
2.5000 mg | INHALATION_SOLUTION | Freq: Once | RESPIRATORY_TRACT | Status: AC
  Administered 2016-06-05: 2.5 mg via RESPIRATORY_TRACT

## 2016-06-06 LAB — PULMONARY FUNCTION TEST
DL/VA % pred: 53 %
DL/VA: 2.55 ml/min/mmHg/L
DLCO unc % pred: 32 %
DLCO unc: 7.98 ml/min/mmHg
FEF 25-75 Post: 0.74 L/sec
FEF 25-75 Pre: 0.73 L/sec
FEF2575-%Change-Post: 2 %
FEF2575-%Pred-Post: 63 %
FEF2575-%Pred-Pre: 61 %
FEV1-%Change-Post: 1 %
FEV1-%Pred-Post: 88 %
FEV1-%Pred-Pre: 87 %
FEV1-Post: 1.29 L
FEV1-Pre: 1.27 L
FEV1FVC-%Change-Post: 0 %
FEV1FVC-%Pred-Pre: 87 %
FEV6-%Change-Post: 1 %
FEV6-%Pred-Post: 109 %
FEV6-%Pred-Pre: 107 %
FEV6-Post: 1.98 L
FEV6-Pre: 1.95 L
FEV6FVC-%Change-Post: 0 %
FEV6FVC-%Pred-Post: 104 %
FEV6FVC-%Pred-Pre: 105 %
FVC-%Change-Post: 1 %
FVC-%Pred-Post: 104 %
FVC-%Pred-Pre: 102 %
FVC-Post: 1.98 L
FVC-Pre: 1.95 L
Post FEV1/FVC ratio: 65 %
Post FEV6/FVC ratio: 100 %
Pre FEV1/FVC ratio: 65 %
Pre FEV6/FVC Ratio: 100 %
RV % pred: 102 %
RV: 2.52 L
TLC % pred: 87 %
TLC: 4.42 L

## 2016-06-16 ENCOUNTER — Encounter: Payer: Self-pay | Admitting: Gastroenterology

## 2016-06-16 ENCOUNTER — Telehealth: Payer: Self-pay | Admitting: Gastroenterology

## 2016-06-16 NOTE — Telephone Encounter (Signed)
Recall for ultrasound in september

## 2016-06-17 NOTE — Telephone Encounter (Signed)
Letter mailed

## 2016-06-24 DIAGNOSIS — I70293 Other atherosclerosis of native arteries of extremities, bilateral legs: Secondary | ICD-10-CM | POA: Diagnosis not present

## 2016-06-24 DIAGNOSIS — L602 Onychogryphosis: Secondary | ICD-10-CM | POA: Diagnosis not present

## 2016-06-24 DIAGNOSIS — E1351 Other specified diabetes mellitus with diabetic peripheral angiopathy without gangrene: Secondary | ICD-10-CM | POA: Diagnosis not present

## 2016-07-03 ENCOUNTER — Ambulatory Visit (INDEPENDENT_AMBULATORY_CARE_PROVIDER_SITE_OTHER): Payer: Medicare Other | Admitting: Urology

## 2016-07-03 DIAGNOSIS — R3914 Feeling of incomplete bladder emptying: Secondary | ICD-10-CM

## 2016-07-03 DIAGNOSIS — N3946 Mixed incontinence: Secondary | ICD-10-CM

## 2016-07-06 ENCOUNTER — Telehealth: Payer: Self-pay | Admitting: Gastroenterology

## 2016-07-06 ENCOUNTER — Other Ambulatory Visit: Payer: Self-pay

## 2016-07-06 DIAGNOSIS — K7469 Other cirrhosis of liver: Secondary | ICD-10-CM

## 2016-07-06 NOTE — Telephone Encounter (Signed)
Pt received letter to schedule Ultrasound. Please call [phone] Patient prefers Wednesday afternoons

## 2016-07-06 NOTE — Telephone Encounter (Signed)
Appointment made for 07/15/16 @ 10:15./ Letter going out in the mail. They could not do it in the afternoon

## 2016-07-09 DIAGNOSIS — I10 Essential (primary) hypertension: Secondary | ICD-10-CM | POA: Diagnosis not present

## 2016-07-09 DIAGNOSIS — E039 Hypothyroidism, unspecified: Secondary | ICD-10-CM | POA: Diagnosis not present

## 2016-07-09 DIAGNOSIS — N390 Urinary tract infection, site not specified: Secondary | ICD-10-CM | POA: Diagnosis not present

## 2016-07-09 DIAGNOSIS — E119 Type 2 diabetes mellitus without complications: Secondary | ICD-10-CM | POA: Diagnosis not present

## 2016-07-09 DIAGNOSIS — I5030 Unspecified diastolic (congestive) heart failure: Secondary | ICD-10-CM | POA: Diagnosis not present

## 2016-07-14 ENCOUNTER — Other Ambulatory Visit: Payer: Self-pay

## 2016-07-15 ENCOUNTER — Ambulatory Visit (HOSPITAL_COMMUNITY): Admission: RE | Admit: 2016-07-15 | Payer: Medicare Other | Source: Ambulatory Visit

## 2016-07-15 DIAGNOSIS — E039 Hypothyroidism, unspecified: Secondary | ICD-10-CM | POA: Diagnosis not present

## 2016-07-15 DIAGNOSIS — E119 Type 2 diabetes mellitus without complications: Secondary | ICD-10-CM | POA: Diagnosis not present

## 2016-07-15 DIAGNOSIS — J449 Chronic obstructive pulmonary disease, unspecified: Secondary | ICD-10-CM | POA: Diagnosis not present

## 2016-07-15 DIAGNOSIS — I5030 Unspecified diastolic (congestive) heart failure: Secondary | ICD-10-CM | POA: Diagnosis not present

## 2016-07-15 DIAGNOSIS — I10 Essential (primary) hypertension: Secondary | ICD-10-CM | POA: Diagnosis not present

## 2016-07-15 DIAGNOSIS — R05 Cough: Secondary | ICD-10-CM | POA: Diagnosis not present

## 2016-07-22 DIAGNOSIS — N3946 Mixed incontinence: Secondary | ICD-10-CM | POA: Diagnosis not present

## 2016-07-22 DIAGNOSIS — R3914 Feeling of incomplete bladder emptying: Secondary | ICD-10-CM | POA: Diagnosis not present

## 2016-07-27 ENCOUNTER — Ambulatory Visit (HOSPITAL_COMMUNITY)
Admission: RE | Admit: 2016-07-27 | Discharge: 2016-07-27 | Disposition: A | Discharge status: Home / Self Care | Payer: Medicare Other | Source: Ambulatory Visit | Attending: Gastroenterology | Admitting: Gastroenterology

## 2016-07-27 DIAGNOSIS — K7469 Other cirrhosis of liver: Secondary | ICD-10-CM | POA: Insufficient documentation

## 2016-07-27 DIAGNOSIS — Z9049 Acquired absence of other specified parts of digestive tract: Secondary | ICD-10-CM | POA: Diagnosis not present

## 2016-07-27 DIAGNOSIS — K7460 Unspecified cirrhosis of liver: Secondary | ICD-10-CM | POA: Diagnosis not present

## 2016-07-30 ENCOUNTER — Encounter: Payer: Self-pay | Admitting: Gastroenterology

## 2016-07-30 ENCOUNTER — Other Ambulatory Visit: Payer: Self-pay

## 2016-07-30 ENCOUNTER — Ambulatory Visit (INDEPENDENT_AMBULATORY_CARE_PROVIDER_SITE_OTHER): Payer: Medicare Other | Admitting: Gastroenterology

## 2016-07-30 VITALS — BP 90/52 | HR 69 | Temp 98.3°F | Ht 64.0 in | Wt 156.8 lb

## 2016-07-30 DIAGNOSIS — R1310 Dysphagia, unspecified: Secondary | ICD-10-CM | POA: Insufficient documentation

## 2016-07-30 DIAGNOSIS — K7469 Other cirrhosis of liver: Secondary | ICD-10-CM | POA: Diagnosis not present

## 2016-07-30 DIAGNOSIS — I2510 Atherosclerotic heart disease of native coronary artery without angina pectoris: Secondary | ICD-10-CM | POA: Diagnosis not present

## 2016-07-30 DIAGNOSIS — K743 Primary biliary cirrhosis: Secondary | ICD-10-CM | POA: Diagnosis not present

## 2016-07-30 DIAGNOSIS — R1319 Other dysphagia: Secondary | ICD-10-CM

## 2016-07-30 DIAGNOSIS — R1314 Dysphagia, pharyngoesophageal phase: Secondary | ICD-10-CM

## 2016-07-30 NOTE — Patient Instructions (Signed)
1. Labs at your convenience.  2. Upper endoscopy as scheduled. See separate instructions.

## 2016-07-30 NOTE — Progress Notes (Signed)
REVIEWED-NO ADDITIONAL RECOMMENDATIONS.  Primary Care Physician: [name], MD  Primary Gastroenterologist:  Barney Drain, MD   Chief Complaint  Patient presents with  . Follow-up    HPI: [name] is a 82 y.o. female here for follow-up. She has a history of well compensated cirrhosis and primary biliary cholangitis. Patient presents for six-month follow-up. She had surveillance ultrasound of the liver which showed stable cirrhotic changes, no evidence of hepatoma. She is due for updated labs which will be arranged. Patient states that she's had a cough ever since she had pneumonia earlier in the year. She has been seen by ENT. She feels like something is sticking in her throat as well. She complains of coughing and strangling with smells as well as when she is eating. She reports EGD 8-9 years ago, describes food impaction with esophageal tear and bleeding. Records not available.  She denies abdominal pain. No heartburn. Bowel function is normal. Denies any melena or rectal bleeding.  Current Outpatient Prescriptions  Medication Sig Dispense Refill  . ALPRAZolam (XANAX) 0.25 MG tablet Take 0.25 mg by mouth 2 (two) times daily as needed for anxiety.    Marland Kitchen atorvastatin (LIPITOR) 10 MG tablet Take 1 tablet (10 mg total) by mouth daily. 30 tablet 0  . cholecalciferol (VITAMIN D) 1000 UNITS tablet Take 1,000 Units by mouth every morning.     . citalopram (CELEXA) 10 MG tablet Take 1 tablet (10 mg total) by mouth daily. (Patient taking differently: Take 20 mg by mouth daily. )    . clopidogrel (PLAVIX) 75 MG tablet Take 75 mg by mouth daily.    Marland Kitchen docusate sodium (COLACE) 100 MG capsule Take 100 mg by mouth 2 (two) times daily.    . feeding supplement, GLUCERNA SHAKE, (GLUCERNA SHAKE) LIQD Take 237 mLs by mouth daily.    . furosemide (LASIX) 40 MG tablet Take 1.5 tablets (60 mg total) by mouth daily. 135 tablet 3  . guaiFENesin (MUCINEX) 600 MG 12 hr tablet Take 1 tablet (600 mg total)  by mouth 2 (two) times daily. 30 tablet 0  . hydrALAZINE (APRESOLINE) 25 MG tablet Take 1 tablet (25 mg total) by mouth 2 (two) times daily. 60 tablet 5  . insulin aspart (NOVOLOG FLEXPEN) 100 UNIT/ML FlexPen Inject 5 Units into the skin 3 (three) times daily with meals. When BG is over 100    . Insulin Glargine (LANTUS SOLOSTAR) 100 UNIT/ML Solostar Pen Inject 8 Units into the skin at bedtime.    Marland Kitchen levothyroxine (SYNTHROID, LEVOTHROID) 25 MCG tablet Take 1 tablet by mouth daily.    Marland Kitchen lidocaine (XYLOCAINE) 5 % ointment APPLY 2-3 GRAMS (1 GRAM=1 INCH) TO AFFECTED AREA 4 TIMES DAILY  5  . loratadine (CLARITIN) 10 MG tablet Take 10 mg by mouth daily.    Marland Kitchen losartan (COZAAR) 25 MG tablet Take 25 mg by mouth daily.    . metFORMIN (GLUCOPHAGE-XR) 500 MG 24 hr tablet Take 250 mg by mouth daily with supper.     . Methylfol-Algae-B12-Acetylcyst (CEREFOLIN NAC) 6-90.314-2-600 MG TABS Take 1 tablet by mouth daily.     . metoprolol (LOPRESSOR) 50 MG tablet Take 35m in the a.m.and 293min the pm    . mirtazapine (REMERON) 30 MG tablet Take 30 mg by mouth at bedtime.    . Multiple Vitamin (MULTIVITAMIN) tablet Take 1 tablet by mouth daily. Reported on 02/06/2016    . naproxen (NAPROSYN) 500 MG tablet Take 1 tablet by mouth 2 (two)  times daily.    . Omega-3 Fatty Acids (FISH OIL) 1000 MG CAPS Take 1,000 mg by mouth daily.    . pantoprazole (PROTONIX) 40 MG tablet Take 1 tablet (40 mg total) by mouth daily. 30 tablet 3  . psyllium (METAMUCIL SMOOTH TEXTURE) 28 % packet Take 1 packet by mouth daily.     Marland Kitchen tiotropium (SPIRIVA) 18 MCG inhalation capsule Place 18 mcg into inhaler and inhale daily.     Marland Kitchen URSO FORTE 500 MG tablet TAKE 1 TABLET TWICE A DAY WITH MEALS 180 tablet 3   No current facility-administered medications for this visit.     Allergies as of 07/30/2016 - Review Complete 07/30/2016  Allergen Reaction Noted  . Aricept [donepezil hcl]  02/16/2014  . Namenda [memantine hcl]  02/16/2014  .  Donepezil Nausea Only 01/20/2016  . Spironolactone Other (See Comments) 12/16/2015  . Codeine Rash 02/16/2014  . Indocin [indomethacin] Rash 09/11/2014  . Latex Rash 02/16/2014  . Penicillins Rash 01/11/2007   Past Medical History:  Diagnosis Date  . Acute delirium 06/23/2013  . Acute diastolic congestive heart failure ([place]) 03/21/2014  . Anxiety   . Carotid artery disease ([facility])    Bilateral ICA occlusion  . Chronic headaches   . Chronic liver disease    ?cirrhosis on 10/2014 CT. H/O elevated alkphos and +AMA on URSO.  Marland Kitchen Coronary atherosclerosis of native coronary artery    Mild nonobstructive disease at cardiac catheterization May 2011  . Depression   . Essential hypertension, benign   . Fracture of femoral neck, right, closed 03/28/2014  . History of breast cancer   . History of GI bleed   . Hyperlipidemia   . Orthostatic hypotension    Diabetic polyneuropathy/diabetic dysautonomia   . Osteopenia   . Parotid tumor   . Pressure ulcer, heel, right, unstageable ([place]) 04/17/2014  . PSVT (paroxysmal supraventricular tachycardia) ([place])   . Syncope and collapse 04/15/2010   Qualifier: Diagnosis of  By: Angelena Form, MD, Harrell Gave    . Type 2 diabetes mellitus ([place])   . UPPER GASTROINTESTINAL HEMORRHAGE 03/21/2007   Qualifier: Diagnosis of  By: Jonna Munro MD, Roderic Scarce     Past Surgical History:  Procedure Laterality Date  . ABDOMINAL HYSTERECTOMY    . APPENDECTOMY    . CHOLECYSTECTOMY  2008  . COLONOSCOPY  2010   Dr. Oneida Alar: single tubular adenoma, one hyperplastic polyp. Next TCS 2020 health permitting.  Marland Kitchen HEMORRHOID SURGERY  1960s  . HIP ARTHROPLASTY Right 03/28/2014   Procedure: ARTHROPLASTY BIPOLAR HIP;  Surgeon: Carole Civil, MD;  Location: [facility];  Service: Orthopedics;  Laterality: Right;  . LAPAROSCOPIC APPENDECTOMY N/A 04/13/2013   Procedure: APPENDECTOMY LAPAROSCOPIC;  Surgeon: Donato Heinz, MD;  Location: [facility];  Service: General;  Laterality: N/A;  . MASTECTOMY  1994    Left breast -related to breast cancer  . VESICOVAGINAL FISTULA CLOSURE W/ TAH  1979   Family History  Problem Relation Age of Onset  . Heart failure Mother   . Cirrhosis Brother   . Lung cancer Father     Brain METS  . Kidney cancer Mother   . Kidney cancer Sister   . Heart attack Neg Hx   . Stroke Neg Hx   . Cancer Mother   . Cancer Father   . Cancer Sister   . Hypertension Sister   . Hypertension Mother    Social History   Social History  . Marital status: Divorced    Spouse name: N/A  .  Number of children: N/A  . Years of education: N/A   Occupational History  . RETIRED FACTORY WORKER     [employer]   Social History Main Topics  . Smoking status: Former Smoker    Packs/day: 0.50    Years: 40.00    Types: Cigarettes    Quit date: 03/30/2013  . Smokeless tobacco: None     Comment: Quit since 03/2013  . Alcohol use No  . Drug use: No  . Sexual activity: No   Other Topics Concern  . 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 Started age 19.   Drug use -no   Alcohol use - no   Lives with son   Education - 12 th grade                   ROS:  General: Negative for anorexia, weight loss, fever, chills, fatigue, weakness. ENT: Negative for hoarseness, difficulty swallowing , nasal congestion. CV: Negative for chest pain, angina, palpitations, dyspnea on exertion, peripheral edema.  Respiratory: Negative for dyspnea at rest, dyspnea on exertion, cough, sputum, wheezing.  GI: See history of present illness. GU:  Negative for dysuria, hematuria, urinary incontinence, urinary frequency, nocturnal urination.  Endo: Negative for unusual weight change.    Physical Examination:   BP (!) 90/52   Pulse 69   Temp 98.3 F (36.8 C) (Oral)   Ht 5' 4" (1.626 m)   Wt 156 lb  12.8 oz (71.1 kg)   BMI 26.91 kg/m   General: Well-nourished, well-developed in no acute distress.  Eyes: No icterus. Mouth: Oropharyngeal mucosa moist and pink , no lesions erythema or exudate. Lungs: Clear to auscultation bilaterally.  Heart: Regular rate and rhythm, no murmurs rubs or gallops.  Abdomen: Bowel sounds are normal, nontender, nondistended, no hepatosplenomegaly or masses, no abdominal bruits or hernia , no rebound or guarding.   Extremities: No lower extremity edema. No clubbing or deformities. Neuro: Alert and oriented x 4   Skin: Warm and dry, no jaundice.   Psych: Alert and cooperative, normal mood and affect.  Labs:  05/17/2016: White blood cell count 3700, hemoglobin 13.4, platelets 223,000, total bilirubin 1, alkaline phosphatase 135, AST 39, ALT 26, albumin 3.7, BUN 12, creatinine 0.56  Imaging Studies: US Abdomen Limited Ruq  Result Date: 07/27/2016 CLINICAL DATA:  History of hepatic cirrhosis for many years, previous cholecystectomy. History breast malignancy and diabetes. EXAM: US ABDOMEN LIMITED - RIGHT UPPER QUADRANT COMPARISON:  Abdominal ultrasound of February 04, 2016 FINDINGS: Gallbladder: The gallbladder is surgically absent. Common bile duct: Diameter: 2.2 mm Liver: The hepatic echotexture is heterogeneous. The surface contour is mildly nodular. There is no discrete mass or ductal dilation. IMPRESSION: Stable appearing cirrhotic changes within the liver. No hepatic mass or ductal dilation. The gallbladder is surgically absent. Electronically Signed   By: David  Martinique M.D.   On: 07/27/2016 09:46

## 2016-07-31 LAB — CBC WITH DIFFERENTIAL/PLATELET
Basophils Absolute: 94 cells/uL (ref 0–200)
Basophils Relative: 2 %
Eosinophils Absolute: 141 cells/uL (ref 15–500)
Eosinophils Relative: 3 %
HCT: 44 % (ref 35.0–45.0)
Hemoglobin: 14.7 g/dL (ref 11.7–15.5)
Lymphocytes Relative: 42 %
Lymphs Abs: 1974 cells/uL (ref 850–3900)
MCH: 33 pg (ref 27.0–33.0)
MCHC: 33.4 g/dL (ref 32.0–36.0)
MCV: 98.9 fL (ref 80.0–100.0)
MPV: 10.9 fL (ref 7.5–12.5)
Monocytes Absolute: 329 cells/uL (ref 200–950)
Monocytes Relative: 7 %
Neutro Abs: 2162 cells/uL (ref 1500–7800)
Neutrophils Relative %: 46 %
Platelets: 253 10*3/uL (ref 140–400)
RBC: 4.45 MIL/uL (ref 3.80–5.10)
RDW: 14.1 % (ref 11.0–15.0)
WBC: 4.7 10*3/uL (ref 3.8–10.8)

## 2016-07-31 LAB — COMPREHENSIVE METABOLIC PANEL
ALT: 13 U/L (ref 6–29)
AST: 23 U/L (ref 10–35)
Albumin: 3.8 g/dL (ref 3.6–5.1)
Alkaline Phosphatase: 109 U/L (ref 33–130)
BUN: 19 mg/dL (ref 7–25)
CO2: 32 mmol/L — ABNORMAL HIGH (ref 20–31)
Calcium: 9 mg/dL (ref 8.6–10.4)
Chloride: 98 mmol/L (ref 98–110)
Creat: 0.95 mg/dL — ABNORMAL HIGH (ref 0.60–0.88)
Glucose, Bld: 133 mg/dL — ABNORMAL HIGH (ref 65–99)
Potassium: 3.7 mmol/L (ref 3.5–5.3)
Sodium: 137 mmol/L (ref 135–146)
Total Bilirubin: 0.9 mg/dL (ref 0.2–1.2)
Total Protein: 6.8 g/dL (ref 6.1–8.1)

## 2016-07-31 LAB — PROTIME-INR
INR: 1.1
Prothrombin Time: 11.4 s (ref 9.0–11.5)

## 2016-07-31 LAB — AFP TUMOR MARKER: AFP-Tumor Marker: 8.8 ng/mL — ABNORMAL HIGH (ref <6.1)

## 2016-08-02 ENCOUNTER — Telehealth: Payer: Self-pay | Admitting: Gastroenterology

## 2016-08-02 NOTE — Telephone Encounter (Signed)
PLEASE CALL PT. HER TUMOR MARKER I STABLE AT 8.8.  HER LIVER TESTS ARE NORMAL. HER U/S SHOWS CIRRHOSIS.COMPLETE EGD IN OCT 2017.  REPEAT LABS/US IIN 6 MOS.

## 2016-08-03 ENCOUNTER — Encounter: Payer: Self-pay | Admitting: Gastroenterology

## 2016-08-03 NOTE — Telephone Encounter (Signed)
Reminder in epic °

## 2016-08-03 NOTE — Telephone Encounter (Signed)
PT's daughter [name] is aware.

## 2016-08-03 NOTE — Telephone Encounter (Signed)
Tried to call with no answer

## 2016-08-03 NOTE — Assessment & Plan Note (Signed)
Several month history of sensation of food sticking in the throat area. Washing food down. In addition she's had chronic cough since pneumonia earlier this year. Complains of coughing and strangling with meals but also with smells. Denies typical heartburn. She has a history of cirrhosis. No screening for varices to date. For new onset dysphagia and need to screen for esophageal varices, would recommend upper endoscopy with possible dilation at this time with Dr. Oneida Alar.  I have discussed the risks, alternatives, benefits with regards to but not limited to the risk of reaction to medication, bleeding, infection, perforation and the patient is agreeable to proceed. Written consent to be obtained.

## 2016-08-03 NOTE — Progress Notes (Signed)
CC'ED TO PCP

## 2016-08-03 NOTE — Assessment & Plan Note (Addendum)
In March her alkaline phosphatase remains slightly elevated. She has evidence of cirrhosis on imaging, imaging up-to-date for hepatoma screening. She is due for labs at this time. Maintain Urso for now. Historically has well compensated disease. Plan for esophageal variceal screening at time of upcoming EGD for dysphagia. Patient is on Plavix daily for bilateral ICA occlusion. To discuss with Dr. Oneida Alar, regarding this. ?need to be held in case of esophageal variceal banding necessary.  I have discussed the risks, alternatives, benefits with regards to but not limited to the risk of reaction to medication, bleeding, infection, perforation and the patient is agreeable to proceed. Written consent to be obtained.

## 2016-08-07 ENCOUNTER — Ambulatory Visit (INDEPENDENT_AMBULATORY_CARE_PROVIDER_SITE_OTHER): Payer: Medicare Other | Admitting: Urology

## 2016-08-07 DIAGNOSIS — N3946 Mixed incontinence: Secondary | ICD-10-CM | POA: Diagnosis not present

## 2016-08-07 DIAGNOSIS — R3914 Feeling of incomplete bladder emptying: Secondary | ICD-10-CM

## 2016-08-07 DIAGNOSIS — N3642 Intrinsic sphincter deficiency (ISD): Secondary | ICD-10-CM | POA: Diagnosis not present

## 2016-08-09 NOTE — Progress Notes (Signed)
Labs addressed by slf, see separate telephone note.

## 2016-08-11 NOTE — Progress Notes (Signed)
Discussed with Dr. Oneida Alar. Continue Plavix. No plans to hold for procedure.

## 2016-08-14 NOTE — OR Nursing (Signed)
Called patient for preop call and spoke with her daughter Coralyn Mark who questioned if the patient needed to stop Plavix. Notified Dr. Oneida Alar who said patient should continue Plavix. Patient's daughter notified and verbalized understanding.

## 2016-08-17 ENCOUNTER — Encounter (HOSPITAL_COMMUNITY)
Admission: RE | Disposition: A | Discharge status: Home / Self Care | Payer: Self-pay | Source: Ambulatory Visit | Attending: Gastroenterology

## 2016-08-17 ENCOUNTER — Other Ambulatory Visit: Payer: Self-pay

## 2016-08-17 ENCOUNTER — Ambulatory Visit (HOSPITAL_COMMUNITY)
Admission: RE | Admit: 2016-08-17 | Discharge: 2016-08-17 | Disposition: A | Discharge status: Home / Self Care | Payer: Medicare Other | Source: Ambulatory Visit | Attending: Gastroenterology | Admitting: Gastroenterology

## 2016-08-17 ENCOUNTER — Encounter (HOSPITAL_COMMUNITY): Payer: Self-pay

## 2016-08-17 ENCOUNTER — Telehealth: Payer: Self-pay | Admitting: Gastroenterology

## 2016-08-17 DIAGNOSIS — K769 Liver disease, unspecified: Secondary | ICD-10-CM | POA: Diagnosis not present

## 2016-08-17 DIAGNOSIS — F329 Major depressive disorder, single episode, unspecified: Secondary | ICD-10-CM | POA: Diagnosis not present

## 2016-08-17 DIAGNOSIS — Z79899 Other long term (current) drug therapy: Secondary | ICD-10-CM | POA: Insufficient documentation

## 2016-08-17 DIAGNOSIS — I110 Hypertensive heart disease with heart failure: Secondary | ICD-10-CM | POA: Diagnosis not present

## 2016-08-17 DIAGNOSIS — Z7902 Long term (current) use of antithrombotics/antiplatelets: Secondary | ICD-10-CM | POA: Diagnosis not present

## 2016-08-17 DIAGNOSIS — E1143 Type 2 diabetes mellitus with diabetic autonomic (poly)neuropathy: Secondary | ICD-10-CM | POA: Insufficient documentation

## 2016-08-17 DIAGNOSIS — Z791 Long term (current) use of non-steroidal anti-inflammatories (NSAID): Secondary | ICD-10-CM | POA: Diagnosis not present

## 2016-08-17 DIAGNOSIS — Z853 Personal history of malignant neoplasm of breast: Secondary | ICD-10-CM | POA: Diagnosis not present

## 2016-08-17 DIAGNOSIS — I2510 Atherosclerotic heart disease of native coronary artery without angina pectoris: Secondary | ICD-10-CM | POA: Insufficient documentation

## 2016-08-17 DIAGNOSIS — Z794 Long term (current) use of insulin: Secondary | ICD-10-CM | POA: Diagnosis not present

## 2016-08-17 DIAGNOSIS — Q394 Esophageal web: Secondary | ICD-10-CM | POA: Diagnosis not present

## 2016-08-17 DIAGNOSIS — R1310 Dysphagia, unspecified: Secondary | ICD-10-CM | POA: Diagnosis not present

## 2016-08-17 DIAGNOSIS — K3189 Other diseases of stomach and duodenum: Secondary | ICD-10-CM | POA: Diagnosis not present

## 2016-08-17 DIAGNOSIS — Z87891 Personal history of nicotine dependence: Secondary | ICD-10-CM | POA: Diagnosis not present

## 2016-08-17 DIAGNOSIS — F419 Anxiety disorder, unspecified: Secondary | ICD-10-CM | POA: Diagnosis not present

## 2016-08-17 DIAGNOSIS — E785 Hyperlipidemia, unspecified: Secondary | ICD-10-CM | POA: Insufficient documentation

## 2016-08-17 DIAGNOSIS — I5032 Chronic diastolic (congestive) heart failure: Secondary | ICD-10-CM | POA: Insufficient documentation

## 2016-08-17 DIAGNOSIS — K259 Gastric ulcer, unspecified as acute or chronic, without hemorrhage or perforation: Secondary | ICD-10-CM | POA: Insufficient documentation

## 2016-08-17 HISTORY — PX: SAVORY DILATION: SHX5439

## 2016-08-17 HISTORY — PX: ESOPHAGOGASTRODUODENOSCOPY: SHX5428

## 2016-08-17 LAB — GLUCOSE, CAPILLARY: Glucose-Capillary: 139 mg/dL — ABNORMAL HIGH (ref 65–99)

## 2016-08-17 SURGERY — EGD (ESOPHAGOGASTRODUODENOSCOPY)
Anesthesia: Moderate Sedation

## 2016-08-17 MED ORDER — PANTOPRAZOLE SODIUM 40 MG PO TBEC: 40.0000 mg | DELAYED_RELEASE_TABLET | Freq: Two times a day (BID) | ORAL | 3 refills | Status: DC

## 2016-08-17 MED ORDER — MIDAZOLAM HCL 5 MG/5ML IJ SOLN
INTRAMUSCULAR | Status: AC
  Filled 2016-08-17: qty 10

## 2016-08-17 MED ORDER — MEPERIDINE HCL 100 MG/ML IJ SOLN
INTRAMUSCULAR | Status: AC
  Filled 2016-08-17: qty 2

## 2016-08-17 MED ORDER — LIDOCAINE VISCOUS 2 % MT SOLN
OROMUCOSAL | Status: DC | PRN
  Administered 2016-08-17: 1 via OROMUCOSAL

## 2016-08-17 MED ORDER — SODIUM CHLORIDE 0.9 % IV SOLN
INTRAVENOUS | Status: DC
  Administered 2016-08-17: 08:00:00 via INTRAVENOUS

## 2016-08-17 MED ORDER — MIDAZOLAM HCL 5 MG/5ML IJ SOLN
INTRAMUSCULAR | Status: DC | PRN
  Administered 2016-08-17: 2 mg via INTRAVENOUS
  Administered 2016-08-17: 1 mg via INTRAVENOUS

## 2016-08-17 MED ORDER — SIMETHICONE 40 MG/0.6ML PO SUSP
ORAL | Status: DC | PRN
  Administered 2016-08-17: 2.5 mL

## 2016-08-17 MED ORDER — MEPERIDINE HCL 100 MG/ML IJ SOLN
INTRAMUSCULAR | Status: DC | PRN
  Administered 2016-08-17 (×2): 25 mg via INTRAVENOUS

## 2016-08-17 MED ORDER — SIMETHICONE 40 MG/0.6ML PO SUSP
ORAL | Status: AC
  Filled 2016-08-17: qty 30

## 2016-08-17 MED ORDER — LIDOCAINE VISCOUS 2 % MT SOLN
OROMUCOSAL | Status: AC
  Filled 2016-08-17: qty 15

## 2016-08-17 MED ORDER — MINERAL OIL PO OIL
TOPICAL_OIL | ORAL | Status: AC
  Filled 2016-08-17: qty 30

## 2016-08-17 NOTE — Telephone Encounter (Signed)
REFER TO [facility] FOR SWALLOWING EVALUATION WITHIN 2 WEEKS. PT NEEDS FIBEROPTIC ENDOSCOPIC EVALUATION OF SWALLOWING(FEES).

## 2016-08-17 NOTE — Discharge Instructions (Signed)
I dilated your esophagus. You have a stricture near the TOP of your esophagus. You have ULCERS IN YOUR STOMACH DUE TO NAPROXEN. I biopsied your stomach.   CONTINUE PROTONIX. INCREASE TO 30 MINUTES PRIOR TO YOUR MEALS TWICE DAILY.  HOLD NAPROXEN RE-START ON OCT 9.  YOUR BIOPSY RESULTS WILL BE AVAILABLE IN MY CHART AFTER OCT 4 AND MY OFFICE WILL CONTACT YOU IN 10-14 DAYS WITH YOUR RESULTS.   REFER TO [facility] FOR SWALLOWING EVALUATION.  PLEASE CALL IN ONE MONTH IF YOUR COUGH AND SWALLOWING PROBLEMS ARE NOT BETTER.  FOLLOW UP IN 6 MOS.   UPPER ENDOSCOPY AFTER CARE Read the instructions outlined below and refer to this sheet in the next week. These discharge instructions provide you with general information on caring for yourself after you leave the hospital. While your treatment has been planned according to the most current medical practices available, unavoidable complications occasionally occur. If you have any problems or questions after discharge, call DR. Kyliee Ortego, [phone].  ACTIVITY  You may resume your regular activity, but move at a slower pace for the next 24 hours.   Take frequent rest periods for the next 24 hours.   Walking will help get rid of the air and reduce the bloated feeling in your belly (abdomen).   No driving for 24 hours (because of the medicine (anesthesia) used during the test).   You may shower.   Do not sign any important legal documents or operate any machinery for 24 hours (because of the anesthesia used during the test).    NUTRITION  Drink plenty of fluids.   You may resume your normal diet as instructed by your doctor.   Begin with a light meal and progress to your normal diet. Heavy or fried foods are harder to digest and may make you feel sick to your stomach (nauseated).   Avoid alcoholic beverages for 24 hours or as instructed.    MEDICATIONS  You may resume your normal medications.   WHAT YOU CAN EXPECT TODAY  Some  feelings of bloating in the abdomen.   Passage of more gas than usual.    IF YOU HAD A BIOPSY TAKEN DURING THE UPPER ENDOSCOPY:  Eat a soft diet IF YOU HAVE NAUSEA, BLOATING, ABDOMINAL PAIN, OR VOMITING.    FINDING OUT THE RESULTS OF YOUR TEST Not all test results are available during your visit. DR. Oneida Alar WILL CALL YOU WITHIN 14 DAYS OF YOUR PROCEDUE WITH YOUR RESULTS. Do not assume everything is normal if you have not heard from DR. Claryce Friel, CALL HER OFFICE AT [phone].  SEEK IMMEDIATE MEDICAL ATTENTION AND CALL THE OFFICE: [phone] IF:  You have more than a spotting of blood in your stool.   Your belly is swollen (abdominal distention).   You are nauseated or vomiting.   You have a temperature over 101F.   You have abdominal pain or discomfort that is severe or gets worse throughout the day.    Ulcer Disease (Peptic Ulcer, Gastric Ulcer, Duodenal Ulcer) You have an ulcer. This may be in your stomach (gastric ulcer) or in the first part of your small bowel, the duodenum (duodenal ulcer). An ulcer is a break in the lining of the stomach or duodenum. The ulcer causes erosion into the deeper tissue.  CAUSES The stomach has a lining to protect itself from the acid that digests food. The lining can be damaged in two main ways:  The Helico Pylori bacteria (H. Pyolori) can infect the lining  of the stomach and cause ulcers.   Nonsteroidal, anti-inflammatory medications (NSAIDS) can cause gastric ulcerations.   Smoking tobacco can increase the acid in the stomach. This can lead to ulcers, and will impair healing of ulcers.   Other factors, such as alcohol use and stress may contribute to ulcer formation. Rarely, a tumor or cancer can cause an ulcer.   SYMPTOMS The problems (symptoms) of ulcer disease are usually a burning or gnawing of the mid-upper belly (abdomen). This is often worse on an empty stomach and may get better with food. This may be associated with feeling  sick to your stomach (nausea), bloating, and vomiting. If the ulcer results in bleeding, it can cause:   Black, tarry stools.   Vomiting of bright red blood.   Vomiting coffee-ground-looking materials.   SEVERE ANEMIA  HOME CARE INSTRUCTIONS  Avoid caffeine.   Special diets are not usually needed.     ESOPHAGEAL STRICTURE  Esophageal strictures can be caused by stomach acid backing up into the tube that carries food from the mouth down to the stomach (lower esophagus).  TREATMENT There are a number of medicines used to treat reflux/stricture, including: Antacids.  Proton-pump inhibitors: PROTONIX   HOME CARE INSTRUCTIONS Eat 2-3 hours before going to bed.  Try to reach and maintain a healthy weight.  Do not eat just a few very large meals. Instead, eat 4 TO 6 smaller meals throughout the day.  Try to identify foods and beverages that make your symptoms worse, and avoid these.  Avoid tight clothing.  Do not exercise right after eating.

## 2016-08-17 NOTE — Op Note (Addendum)
[facility] Patient Name: [name] Procedure Date: 08/17/2016 8:44 AM MRN: [id number] Date of Birth: September 21, 1931 Attending MD: Barney Drain , MD CSN: [id number] Age: 82 Admit Type: Ambulatory Procedure:                Upper GI endoscopy with COLD FORCEPS                            BIOPSY/ESOPHAGEAL DILATION Indications:              Dysphagia Providers:                Barney Drain, MD, Lurline Del, RN, Randa Spike,                            Technician Referring MD:             Teodora Medici. Kim Medicines:                Meperidine 50 mg IV, Midazolam 3 mg IV Complications:            No immediate complications. Estimated Blood Loss:     Estimated blood loss was minimal. Procedure:                Pre-Anesthesia Assessment:                           - Prior to the procedure, a History and Physical                            was performed, and patient medications and                            allergies were reviewed. The patient's tolerance of                            previous anesthesia was also reviewed. The risks                            and benefits of the procedure and the sedation                            options and risks were discussed with the patient.                            All questions were answered, and informed consent                            was obtained. Prior Anticoagulants: The patient has                            taken Plavix (clopidogrel), last dose was day of                            procedure. ASA Grade Assessment: II - A patient  with mild systemic disease. After reviewing the                            risks and benefits, the patient was deemed in                            satisfactory condition to undergo the procedure.                            After obtaining informed consent, the endoscope was                            passed under direct vision. Throughout the                            procedure, the  patient's blood pressure, pulse, and                            oxygen saturations were monitored continuously. The                            [id number] ([id number]) was introduced through the                            mouth, and advanced to the second part of duodenum.                            The upper GI endoscopy was accomplished without                            difficulty. The patient tolerated the procedure                            well. Scope In: 9:02:26 AM Scope Out: 9:19:16 AM Total Procedure Duration: 0 hours 16 minutes 50 seconds  Findings:      A web was found in the upper third of the esophagus. A guidewire was       placed and the scope was withdrawn. Dilation was performed with a Savary       dilator with mild resistance at 14 mm, 16 mm and 17 mm.      Five gastric ulcers with no stigmata of bleeding were found in the       gastric antrum. The largest lesion was 3 mm in largest dimension. This       was biopsied with a cold forceps for histology.      The examined duodenum was normal. Impression:               - POSSIBLE Web in the upper third of the esophagus.                            Dilated.                           - Gastric ulcers, MOST LIKELY DUE TO NAPROXEN USE,  LESS LIKELY GIST Moderate Sedation:      Moderate (conscious) sedation was administered by the endoscopy nurse       and supervised by the endoscopist. The following parameters were       monitored: oxygen saturation, heart rate, blood pressure, and response       to care. Total physician intraservice time was 26 minutes. Recommendation:           - Resume previous diet.                           - Continue present medications. INCREASE PROTONIX                            TO BID. HOLD NAPROXEN FOR ONE WEEK. CONTINUE PLAVIX.                           - Await pathology results.                           - REFER TO [facility] FOR FEES                           - Return to  my office in 6 months.                           - Patient has a contact number available for                            emergencies. The signs and symptoms of potential                            delayed complications were discussed with the                            patient. Return to normal activities tomorrow.                            Written discharge instructions were provided to the                            patient. Procedure Code(s):        --- Professional ---                           [phone], Esophagogastroduodenoscopy, flexible,                            transoral; with insertion of guide wire followed by                            passage of dilator(s) through esophagus over guide                            wire  [id number], Esophagogastroduodenoscopy, flexible,                            transoral; with biopsy, single or multiple                           99152, Moderate sedation services provided by the                            same physician or other qualified health care                            professional performing the diagnostic or                            therapeutic service that the sedation supports,                            requiring the presence of an independent trained                            observer to assist in the monitoring of the                            patient's level of consciousness and physiological                            status; initial 15 minutes of intraservice time,                            patient age 68 years or older                           [phone], Moderate sedation services; each additional                            15 minutes intraservice time Diagnosis Code(s):        --- Professional ---                           Q39.4, Esophageal web                           K25.9, Gastric ulcer, unspecified as acute or                            chronic, without hemorrhage or perforation                            R13.10, Dysphagia, unspecified CPT copyright 2016 [employer]. All rights reserved. The codes documented in this report are preliminary and upon coder review may  be revised to meet current compliance requirements. Barney Drain, MD Barney Drain, MD 08/17/2016 9:38:33 AM This report has been signed electronically. Number of Addenda: 0

## 2016-08-17 NOTE — Telephone Encounter (Signed)
Referral has been made.

## 2016-08-17 NOTE — H&P (Signed)
Primary Care Physician:  [name], MD Primary Gastroenterologist:  Dr. Oneida Alar  Pre-Procedure History & Physical: HPI:  [name] is a 82 y.o. female here for DYSPHAGIA.  Past Medical History:  Diagnosis Date  . Acute delirium 06/23/2013  . Acute diastolic congestive heart failure ([place]) 03/21/2014  . Anxiety   . Carotid artery disease (H[facility])    Bilateral ICA occlusion  . Chronic headaches   . Chronic liver disease    ?cirrhosis on 10/2014 CT. H/O elevated alkphos and +AMA on URSO.  Marland Kitchen Coronary atherosclerosis of native coronary artery    Mild nonobstructive disease at cardiac catheterization May 2011  . Depression   . Essential hypertension, benign   . Fracture of femoral neck, right, closed ([place]) 03/28/2014  . History of breast cancer   . History of GI bleed   . Hyperlipidemia   . Orthostatic hypotension    Diabetic polyneuropathy/diabetic dysautonomia   . Osteopenia   . Parotid tumor   . Pressure ulcer, heel, right, unstageable ([place]) 04/17/2014  . PSVT (paroxysmal supraventricular tachycardia) ([place])   . Syncope and collapse 04/15/2010   Qualifier: Diagnosis of  By: Angelena Form, MD, Harrell Gave    . Type 2 diabetes mellitus ([place])   . UPPER GASTROINTESTINAL HEMORRHAGE 03/21/2007   Qualifier: Diagnosis of  By: Jonna Munro MD, Roderic Scarce      Past Surgical History:  Procedure Laterality Date  . ABDOMINAL HYSTERECTOMY    . APPENDECTOMY    . CHOLECYSTECTOMY  2008  . COLONOSCOPY  2010   Dr. Oneida Alar: single tubular adenoma, one hyperplastic polyp. Next TCS 2020 health permitting.  Marland Kitchen HEMORRHOID SURGERY  1960s  . HIP ARTHROPLASTY Right 03/28/2014   Procedure: ARTHROPLASTY BIPOLAR HIP;  Surgeon: Carole Civil, MD;  Location: [facility];  Service: Orthopedics;  Laterality: Right;  . LAPAROSCOPIC APPENDECTOMY N/A 04/13/2013   Procedure: APPENDECTOMY LAPAROSCOPIC;  Surgeon: Donato Heinz, MD;  Location: [facility];  Service: General;  Laterality: N/A;  . MASTECTOMY  1994   Left breast -related  to breast cancer  . VESICOVAGINAL FISTULA CLOSURE W/ TAH  1979    Prior to Admission medications   Medication Sig Start Date End Date Taking? Authorizing Provider  ALPRAZolam (XANAX) 0.25 MG tablet Take 0.25 mg by mouth 2 (two) times daily as needed for anxiety.   Yes Historical Provider, MD  atorvastatin (LIPITOR) 10 MG tablet Take 1 tablet (10 mg total) by mouth daily. 02/20/14  Yes [name], MD  cholecalciferol (VITAMIN D) 1000 UNITS tablet Take 1,000 Units by mouth every morning.    Yes Historical Provider, MD  citalopram (CELEXA) 10 MG tablet Take 1 tablet (10 mg total) by mouth daily. Patient taking differently: Take 20 mg by mouth daily.  04/20/14  Yes Kathie Dike, MD  clopidogrel (PLAVIX) 75 MG tablet Take 75 mg by mouth daily.   Yes Historical Provider, MD  docusate sodium (COLACE) 100 MG capsule Take 100 mg by mouth 2 (two) times daily.   Yes Historical Provider, MD  feeding supplement, GLUCERNA SHAKE, (GLUCERNA SHAKE) LIQD Take 237 mLs by mouth daily.   Yes Historical Provider, MD  furosemide (LASIX) 40 MG tablet Take 1.5 tablets (60 mg total) by mouth daily. 02/18/16  Yes Minus Breeding, MD  guaiFENesin (MUCINEX) 600 MG 12 hr tablet Take 1 tablet (600 mg total) by mouth 2 (two) times daily. 12/26/15  Yes Kathie Dike, MD  hydrALAZINE (APRESOLINE) 25 MG tablet Take 1 tablet (25 mg total) by mouth 2 (two) times daily.  12/16/15  Yes Imogene Burn, PA-C  insulin aspart (NOVOLOG FLEXPEN) 100 UNIT/ML FlexPen Inject 5 Units into the skin 3 (three) times daily with meals. When BG is over 100   Yes Historical Provider, MD  Insulin Glargine (LANTUS SOLOSTAR) 100 UNIT/ML Solostar Pen Inject 8 Units into the skin at bedtime.   Yes Historical Provider, MD  levothyroxine (SYNTHROID, LEVOTHROID) 25 MCG tablet Take 1 tablet by mouth daily. 04/14/16  Yes Historical Provider, MD  lidocaine (XYLOCAINE) 5 % ointment APPLY 2-3 GRAMS (1 GRAM=1 INCH) TO AFFECTED AREA 4 TIMES DAILY 04/20/16  Yes Historical  Provider, MD  loratadine (CLARITIN) 10 MG tablet Take 10 mg by mouth daily.   Yes Historical Provider, MD  losartan (COZAAR) 25 MG tablet Take 25 mg by mouth daily.   Yes Historical Provider, MD  metFORMIN (GLUCOPHAGE-XR) 500 MG 24 hr tablet Take 250 mg by mouth daily with supper.    Yes Historical Provider, MD  Methylfol-Algae-B12-Acetylcyst (CEREFOLIN NAC) 6-90.314-2-600 MG TABS Take 1 tablet by mouth daily.    Yes Historical Provider, MD  metoprolol (LOPRESSOR) 50 MG tablet Take 28m in the a.m.and 241min the pm 12/16/15  Yes MiImogene BurnPA-C  mirtazapine (REMERON) 30 MG tablet Take 30 mg by mouth at bedtime.   Yes Historical Provider, MD  Multiple Vitamin (MULTIVITAMIN) tablet Take 1 tablet by mouth daily. Reported on 02/06/2016   Yes Historical Provider, MD  naproxen (NAPROSYN) 500 MG tablet Take 1 tablet by mouth 2 (two) times daily. 04/14/16  Yes Historical Provider, MD  Omega-3 Fatty Acids (FISH OIL) 1000 MG CAPS Take 1,000 mg by mouth daily.   Yes Historical Provider, MD  pantoprazole (PROTONIX) 40 MG tablet Take 1 tablet (40 mg total) by mouth daily. 08/31/14  Yes AnAnnitta NeedsNP  psyllium (METAMUCIL SMOOTH TEXTURE) 28 % packet Take 1 packet by mouth daily.    Yes Historical Provider, MD  tiotropium (SPIRIVA) 18 MCG inhalation capsule Place 18 mcg into inhaler and inhale daily.    Yes Historical Provider, MD  URSO FORTE 500 MG tablet TAKE 1 TABLET TWICE A DAY WITH MEALS 10/08/15  Yes LeMahala MenghiniPA-C    Allergies as of 07/30/2016 - Review Complete 07/30/2016  Allergen Reaction Noted  . Aricept [donepezil hcl]  02/16/2014  . Namenda [memantine hcl]  02/16/2014  . Donepezil Nausea Only 01/20/2016  . Spironolactone Other (See Comments) 12/16/2015  . Codeine Rash 02/16/2014  . Indocin [indomethacin] Rash 09/11/2014  . Latex Rash 02/16/2014  . Penicillins Rash 01/11/2007    Family History  Problem Relation Age of Onset  . Heart failure Mother   . Kidney cancer Mother   .  Cancer Mother   . Hypertension Mother   . Lung cancer Father     Brain METS  . Cancer Father   . Cirrhosis Brother   . Kidney cancer Sister   . Cancer Sister   . Hypertension Sister   . Heart attack Neg Hx   . Stroke Neg Hx     Social History   Social History  . Marital status: Divorced    Spouse name: N/A  . Number of children: N/A  . Years of education: N/A   Occupational History  . RETIRED FACTORY WORKER     [employer]   Social History Main Topics  . Smoking status: Former Smoker    Packs/day: 0.50    Years: 40.00    Types: Cigarettes    Quit date: 03/30/2013  .  Smokeless tobacco: Never Used     Comment: Quit since 03/2013  . Alcohol use No  . Drug use: No  . Sexual activity: No   Other Topics Concern  . 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 Started age 64.   Drug use -no   Alcohol use - no   Lives with son   Education - 12 th grade                   Review of Systems: See HPI, otherwise negative ROS   Physical Exam: BP (!) 183/80   Pulse 60   Temp 97.8 F (36.6 C) (Oral)   Resp 17   SpO2 93%  General:   Alert,  pleasant and cooperative in NAD Head:  Normocephalic and atraumatic. Neck:  Supple; Lungs:  Clear throughout to auscultation.    Heart:  Regular rate and rhythm. Abdomen:  Soft, nontender and nondistended. Normal bowel sounds, without guarding, and without rebound.   Neurologic:  Alert and  oriented x4;  grossly normal neurologically.  Impression/Plan:     DYSPHAGIA  PLAN:  EGD/DIL TODAY

## 2016-08-18 NOTE — Progress Notes (Signed)
Noted. Placed in rack over printer.

## 2016-08-19 NOTE — Progress Notes (Signed)
ON RECALL  °

## 2016-08-25 DIAGNOSIS — R21 Rash and other nonspecific skin eruption: Secondary | ICD-10-CM | POA: Diagnosis not present

## 2016-08-25 DIAGNOSIS — I10 Essential (primary) hypertension: Secondary | ICD-10-CM | POA: Diagnosis not present

## 2016-08-25 DIAGNOSIS — E119 Type 2 diabetes mellitus without complications: Secondary | ICD-10-CM | POA: Diagnosis not present

## 2016-08-25 DIAGNOSIS — Z23 Encounter for immunization: Secondary | ICD-10-CM | POA: Diagnosis not present

## 2016-08-27 ENCOUNTER — Encounter (HOSPITAL_COMMUNITY): Payer: Self-pay | Admitting: Gastroenterology

## 2016-08-31 DIAGNOSIS — L602 Onychogryphosis: Secondary | ICD-10-CM | POA: Diagnosis not present

## 2016-08-31 DIAGNOSIS — L84 Corns and callosities: Secondary | ICD-10-CM | POA: Diagnosis not present

## 2016-08-31 DIAGNOSIS — E1151 Type 2 diabetes mellitus with diabetic peripheral angiopathy without gangrene: Secondary | ICD-10-CM | POA: Diagnosis not present

## 2016-09-02 ENCOUNTER — Telehealth: Payer: Self-pay | Admitting: Gastroenterology

## 2016-09-02 ENCOUNTER — Other Ambulatory Visit: Payer: Self-pay

## 2016-09-02 ENCOUNTER — Telehealth (HOSPITAL_COMMUNITY): Payer: Self-pay

## 2016-09-02 DIAGNOSIS — R1310 Dysphagia, unspecified: Secondary | ICD-10-CM

## 2016-09-02 NOTE — Telephone Encounter (Signed)
Reminder in epic °

## 2016-09-02 NOTE — Telephone Encounter (Signed)
10/18 [facility] called to see why we hadn't scheduled her for a SP evaluation.  The referral wasn't in our workque and it wasn't on her referral tab.  The referral is under "Other Orders".  I called her but had to leave a message.

## 2016-09-02 NOTE — Telephone Encounter (Signed)
PT's daughter, [name], was informed. She said her mom is so much better since Dr. Oneida Alar stretched her esophagus.

## 2016-09-02 NOTE — Telephone Encounter (Signed)
Called Speech Therapy at [facility]. Advised they would call pt. Re-entered order for speech therapy.

## 2016-09-02 NOTE — Telephone Encounter (Signed)
To Dr. Shaune Pollack.

## 2016-09-02 NOTE — Telephone Encounter (Signed)
PLEASE CALL PT. HER biopsy shows ulcers from NAPROXEN. TAKE PROTONIX TWICE DAILY. REPEAT EGD IN 3 MOS TO MAKE SURE YOUR ULCERS HAVE HEALED  THE REFERRAL TO SPEECH THERAPY FOR SWALLOWING EVALUATION HAS BEEN MADE.  PLEASE CALL IN ONE MONTH IF YOUR COUGH AND SWALLOWING PROBLEMS ARE NOT BETTER.  FOLLOW UP IN MAR 2018 E30 DYSPHAGIA, PBC.

## 2016-09-03 NOTE — Telephone Encounter (Signed)
Left the message on the machine for [name] and told her to call if she has questions.  Sending FYI to [place].

## 2016-09-03 NOTE — Telephone Encounter (Signed)
REVIEWED. PLEASE CALL PT. IF HER SWALLOWING IS IMPROVED PT MAY HOLD OFF ON SPEECH EVALUATION.

## 2016-09-10 ENCOUNTER — Telehealth (HOSPITAL_COMMUNITY): Payer: Self-pay

## 2016-09-10 NOTE — Telephone Encounter (Signed)
I spoke with patients daughter and she said Dr Oneida Alar said SPT is not needed and to cancel the appt.

## 2016-09-14 ENCOUNTER — Ambulatory Visit (HOSPITAL_COMMUNITY): Payer: Medicare Other | Admitting: Speech Pathology

## 2016-10-04 ENCOUNTER — Other Ambulatory Visit: Payer: Self-pay | Admitting: Gastroenterology

## 2016-10-17 ENCOUNTER — Other Ambulatory Visit: Payer: Self-pay | Admitting: Gastroenterology

## 2016-10-21 ENCOUNTER — Telehealth: Payer: Self-pay | Admitting: Gastroenterology

## 2016-10-21 NOTE — Telephone Encounter (Signed)
Recall for repeat egd in 3 months

## 2016-10-21 NOTE — Telephone Encounter (Signed)
Please call to update information

## 2016-10-26 NOTE — Addendum Note (Signed)
Addended by: Everardo All on: 10/26/2016 12:39 PM   Modules accepted: Orders

## 2016-10-26 NOTE — Telephone Encounter (Signed)
Gastroenterology Pre-Procedure Review  Request Date: 10/26/2016 Requesting Physician: Dr. Oneida Alar  PATIENT REVIEW QUESTIONS: The patient responded to the following health history questions as indicated:    1. Diabetes Melitis: YES 2. Joint replacements in the past 12 months: no 3. Major health problems in the past 3 months: no 4. Has an artificial valve or MVP: no 5. Has a defibrillator: no 6. Has been advised in past to take antibiotics in advance of a procedure like teeth cleaning: no 7. Family history of colon cancer: no  8. Alcohol Use: no 9. History of sleep apnea: no  10. History of coronary artery or other vascular stents placed within the last 12 months: no    MEDICATIONS & ALLERGIES:    Patient reports the following regarding taking any blood thinners:   Plavix? no Aspirin? no Coumadin? no Brilinta? no Xarelto? no Eliquis? no Pradaxa? no Savaysa? no Effient? no  Patient confirms/reports the following medications:  Current Outpatient Prescriptions  Medication Sig Dispense Refill  . ALPRAZolam (XANAX) 0.25 MG tablet Take 0.25 mg by mouth 2 (two) times daily as needed for anxiety.    Marland Kitchen atorvastatin (LIPITOR) 10 MG tablet Take 1 tablet (10 mg total) by mouth daily. 30 tablet 0  . cholecalciferol (VITAMIN D) 1000 UNITS tablet Take 1,000 Units by mouth every morning.     . citalopram (CELEXA) 10 MG tablet Take 1 tablet (10 mg total) by mouth daily. (Patient taking differently: Take 20 mg by mouth daily. )    . clopidogrel (PLAVIX) 75 MG tablet Take 75 mg by mouth daily.    Marland Kitchen docusate sodium (COLACE) 100 MG capsule Take 100 mg by mouth 2 (two) times daily.    . feeding supplement, GLUCERNA SHAKE, (GLUCERNA SHAKE) LIQD Take 237 mLs by mouth daily.    Marland Kitchen guaiFENesin (MUCINEX) 600 MG 12 hr tablet Take 1 tablet (600 mg total) by mouth 2 (two) times daily. 30 tablet 0  . hydrALAZINE (APRESOLINE) 25 MG tablet Take 1 tablet (25 mg total) by mouth 2 (two) times daily. 60 tablet 5  .  insulin aspart (NOVOLOG FLEXPEN) 100 UNIT/ML FlexPen Inject 5 Units into the skin 3 (three) times daily with meals. When BG is over 100    . Insulin Glargine (LANTUS SOLOSTAR) 100 UNIT/ML Solostar Pen Inject 8 Units into the skin at bedtime.    . lidocaine (XYLOCAINE) 5 % ointment APPLY 2-3 GRAMS (1 GRAM=1 INCH) TO AFFECTED AREA 4 TIMES DAILY/ As needed  5  . loratadine (CLARITIN) 10 MG tablet Take 10 mg by mouth daily.    Marland Kitchen losartan (COZAAR) 25 MG tablet Take 25 mg by mouth daily.    . Methylfol-Algae-B12-Acetylcyst (CEREFOLIN NAC) 6-90.314-2-600 MG TABS Take 1 tablet by mouth daily.     . metoprolol (LOPRESSOR) 50 MG tablet Take 50m in the a.m.and 255min the pm    . mirtazapine (REMERON) 30 MG tablet Take 30 mg by mouth at bedtime.    . Multiple Vitamin (MULTIVITAMIN) tablet Take 1 tablet by mouth daily. Reported on 02/06/2016    . pantoprazole (PROTONIX) 40 MG tablet TAKE 1 TABLET BY MOUTH ONCE DAILY. (Patient taking differently: Takes bid) 30 tablet 11  . psyllium (METAMUCIL SMOOTH TEXTURE) 28 % packet Take 1 packet by mouth daily.     . Marland Kitchenhyroid (ARMOUR) 15 MG tablet Take 15 mg by mouth daily.    . Marland Kitchenmeclidinium bromide (INCRUSE ELLIPTA) 62.5 MCG/INH AEPB Inhale 1 puff into the lungs daily.    .Marland Kitchen  ursodiol (ACTIGALL) 500 MG tablet TAKE 1 TABLET TWICE A DAY WITH MEALS 180 tablet 3  . furosemide (LASIX) 40 MG tablet Take 1.5 tablets (60 mg total) by mouth daily. (Patient taking differently: Take 40 mg by mouth daily. ) 135 tablet 3  . tiotropium (SPIRIVA) 18 MCG inhalation capsule Place 18 mcg into inhaler and inhale daily.      No current facility-administered medications for this visit.     Patient confirms/reports the following allergies:  Allergies  Allergen Reactions  . Aricept [Donepezil Hcl]     Hives, vomiting and headache  . Namenda [Memantine Hcl]     hives  . Donepezil Nausea Only  . Spironolactone Other (See Comments)    Dizziness, balance problems  . Codeine Rash  .  Indocin [Indomethacin] Rash  . Latex Rash  . Penicillins Rash    No orders of the defined types were placed in this encounter.   AUTHORIZATION INFORMATION Primary Insurance:   ID #:   Group #:  Pre-Cert / Auth required:  Pre-Cert / Auth #:   Secondary Insurance:   ID #:   Group #:  Pre-Cert / Auth required:  Pre-Cert / Auth #:   SCHEDULE INFORMATION: Procedure has been scheduled as follows:  Date:               Time:   Location:   This Gastroenterology Pre-Precedure Review Form is being routed to the following provider(s): Barney Drain, MD

## 2016-10-26 NOTE — Telephone Encounter (Signed)
Left Vm for daughter [name] to call.

## 2016-10-26 NOTE — Addendum Note (Signed)
Addended by: Everardo All on: 10/26/2016 01:36 PM   Modules accepted: Orders

## 2016-10-28 NOTE — Telephone Encounter (Signed)
TAKE HALF LANTUS DOSE ON NIGHT PRIOR TO EGD. MAY USE NOVOLOG [name] OF BG DIF [name] IS > 200. SHE SHOULD TAKE HALF HER LOPRESSOR ON THE MORNING OF HER EGD. HOLD HYDRALAZINE [name].

## 2016-10-29 ENCOUNTER — Other Ambulatory Visit: Payer: Self-pay

## 2016-10-29 DIAGNOSIS — R1310 Dysphagia, unspecified: Secondary | ICD-10-CM

## 2016-10-29 NOTE — Telephone Encounter (Signed)
Forwarding to [facility].

## 2016-10-29 NOTE — Telephone Encounter (Signed)
Pt is set up for 11/26/16 @ 10:30. Daughter is aware and instructions are in the mail

## 2016-11-16 DIAGNOSIS — J189 Pneumonia, unspecified organism: Secondary | ICD-10-CM

## 2016-11-16 HISTORY — DX: Pneumonia, unspecified organism: J18.9

## 2016-11-20 DIAGNOSIS — I10 Essential (primary) hypertension: Secondary | ICD-10-CM | POA: Diagnosis not present

## 2016-11-20 DIAGNOSIS — R05 Cough: Secondary | ICD-10-CM | POA: Diagnosis not present

## 2016-11-20 DIAGNOSIS — E119 Type 2 diabetes mellitus without complications: Secondary | ICD-10-CM | POA: Diagnosis not present

## 2016-11-20 DIAGNOSIS — J441 Chronic obstructive pulmonary disease with (acute) exacerbation: Secondary | ICD-10-CM | POA: Diagnosis not present

## 2016-11-24 ENCOUNTER — Telehealth: Payer: Self-pay

## 2016-11-24 NOTE — Telephone Encounter (Signed)
PLEASE CALL PT. She can [facility] AFTER JAN 25.

## 2016-11-24 NOTE — Telephone Encounter (Signed)
Pt has pneumonia and she is on anti-bx.  She does not have a fever but she is wanting to know if she should still have the EGD on Thursday. Please advise

## 2016-11-24 NOTE — Telephone Encounter (Signed)
Pt's daughter is aware

## 2016-11-26 ENCOUNTER — Ambulatory Visit (HOSPITAL_COMMUNITY): Admission: RE | Admit: 2016-11-26 | Payer: Medicare Other | Source: Ambulatory Visit | Admitting: Gastroenterology

## 2016-11-26 ENCOUNTER — Encounter (HOSPITAL_COMMUNITY): Admission: RE | Payer: Self-pay | Source: Ambulatory Visit

## 2016-11-26 SURGERY — EGD (ESOPHAGOGASTRODUODENOSCOPY)
Anesthesia: Moderate Sedation

## 2016-12-15 ENCOUNTER — Telehealth: Payer: Self-pay

## 2016-12-15 MED ORDER — PANTOPRAZOLE SODIUM 40 MG PO TBEC: 40.0000 mg | DELAYED_RELEASE_TABLET | Freq: Two times a day (BID) | ORAL | 0 refills | Status: DC

## 2016-12-15 NOTE — Telephone Encounter (Signed)
Done.

## 2016-12-15 NOTE — Telephone Encounter (Signed)
PT's daughter, [name], called to say they have a prescription from [facility] coming in for the bid Pantoprazole. ( However, it will be 10 days before it will arrive).  She would like a prescription sent to [facility] for Pantoprazole bid for 10 days . It is OK if the insurance does not cover it, she would still like the prescription sent in.

## 2016-12-21 ENCOUNTER — Telehealth: Payer: Self-pay | Admitting: Gastroenterology

## 2016-12-21 NOTE — Telephone Encounter (Signed)
Letter mailed.

## 2016-12-21 NOTE — Telephone Encounter (Signed)
March recall for labs and ultrasound

## 2016-12-22 ENCOUNTER — Encounter: Payer: Self-pay | Admitting: Nurse Practitioner

## 2016-12-22 ENCOUNTER — Other Ambulatory Visit: Payer: Self-pay

## 2016-12-22 ENCOUNTER — Ambulatory Visit (INDEPENDENT_AMBULATORY_CARE_PROVIDER_SITE_OTHER): Payer: Medicare Other | Admitting: Nurse Practitioner

## 2016-12-22 DIAGNOSIS — K279 Peptic ulcer, site unspecified, unspecified as acute or chronic, without hemorrhage or perforation: Secondary | ICD-10-CM | POA: Diagnosis not present

## 2016-12-22 MED ORDER — PANTOPRAZOLE SODIUM 40 MG PO TBEC: 40.0000 mg | DELAYED_RELEASE_TABLET | Freq: Two times a day (BID) | ORAL | 1 refills | Status: DC

## 2016-12-22 NOTE — Patient Instructions (Signed)
1. We will schedule your procedure for you. 2. I have sent in a new prescription for Protonix to your pharmacy for twice a day dosing. 3. Below our instructions related to your medicines for the day of your procedure and the day before your procedure. 4. Call if you have any questions.   TAKE HALF LANTUS DOSE ON NIGHT PRIOR TO EGD. MAY USE NOVOLOG [name] IF [name] IS > 200. SHE SHOULD TAKE HALF HER LOPRESSOR ON THE MORNING OF HER EGD. HOLD HYDRALAZINE [name].

## 2016-12-22 NOTE — Progress Notes (Addendum)
REVIEWED-NO ADDITIONAL RECOMMENDATIONS.  Referring Provider: [name], MD Primary Care Physician:  [name], MD Primary GI:  Dr. Oneida Alar  Chief Complaint  Patient presents with  . EGD    HPI:   [name] is a 83 y.o. female who presents to update her H&P prior to procedure. Previously EGD was scheduled for 11/26/2016 however the patient had to cancel due to pneumonia on antibiotics. Previous EGD completed 08/17/2016 which found a web status post dilation, 5 gastric ulcers without stigmata of bleeding in the gastric antrum the largest being 3 mm in dimension status post biopsy. Gastric ulcers most likely due to naproxen use. Recommended increased Protonix to twice daily, hold naproxen for 1 week and continue Plavix, refer to speech therapy for FEES. Surgical pathology per below. Recommended repeat endoscopy in 3 months for ulcer healing.  Today she states she's doing well. She has recovered from pneumonia. No more coughing, fevers. Denies abdominal pain, N/V, hematochezia, melena. Having regular/bristol 4 stools; rare straining. Takes stool softeners and Metamucil 1-2 times a day. Uses O2 at home, no worsening dyspnea. Denies chest pain, dizziness, lightheadedness, syncope, near syncope. Denies any other upper or lower GI symptoms.  Her family states they have kept her off of Naproxen. No other NSAIDs or ASA powders. Updated Rx for Protonix had not been sent in and was running out of PPI early.    Past Medical History:  Diagnosis Date  . Acute delirium 06/23/2013  . Acute diastolic congestive heart failure ([place]) 03/21/2014  . Anxiety   . Carotid artery disease (HCC)    Bilateral ICA occlusion  . Chronic headaches   . Chronic liver disease    ?cirrhosis on 10/2014 CT. H/O elevated alkphos and +AMA on URSO.  Marland Kitchen Coronary atherosclerosis of native coronary artery    Mild nonobstructive disease at cardiac catheterization May 2011  . Depression   . Essential hypertension, benign   .  Fracture of femoral neck, right, closed ([place]) 03/28/2014  . History of breast cancer   . History of GI bleed   . Hyperlipidemia   . Orthostatic hypotension    Diabetic polyneuropathy/diabetic dysautonomia   . Osteopenia   . Parotid tumor   . Pressure ulcer, heel, right, unstageable ([place]) 04/17/2014  . PSVT (paroxysmal supraventricular tachycardia) ([place])   . Syncope and collapse 04/15/2010   Qualifier: Diagnosis of  By: Angelena Form, MD, Harrell Gave    . Type 2 diabetes mellitus ([place])   . UPPER GASTROINTESTINAL HEMORRHAGE 03/21/2007   Qualifier: Diagnosis of  By: Jonna Munro MD, Roderic Scarce      Past Surgical History:  Procedure Laterality Date  . ABDOMINAL HYSTERECTOMY    . APPENDECTOMY    . CHOLECYSTECTOMY  2008  . COLONOSCOPY  2010   Dr. Oneida Alar: single tubular adenoma, one hyperplastic polyp. Next TCS 2020 health permitting.  . ESOPHAGOGASTRODUODENOSCOPY N/A 08/17/2016   Procedure: ESOPHAGOGASTRODUODENOSCOPY (EGD);  Surgeon: Danie Binder, MD;  [facility];  Service: Endoscopy;  Laterality: N/A;  8:45 am  . HEMORRHOID SURGERY  1960s  . HIP ARTHROPLASTY Right 03/28/2014   Procedure: ARTHROPLASTY BIPOLAR HIP;  Surgeon: Carole Civil, MD;  Location: [facility];  Service: Orthopedics;  Laterality: Right;  . LAPAROSCOPIC APPENDECTOMY N/A 04/13/2013   Procedure: APPENDECTOMY LAPAROSCOPIC;  Surgeon: Donato Heinz, MD;  Location: [facility];  Service: General;  Laterality: N/A;  . MASTECTOMY  1994   Left breast -related to breast cancer  . SAVORY DILATION N/A 08/17/2016   Procedure: SAVORY DILATION;  Surgeon: Danie Binder, MD;  [facility];  Service: Endoscopy;  Laterality: N/A;  . VESICOVAGINAL FISTULA CLOSURE W/ TAH  1979    Current Outpatient Prescriptions  Medication Sig Dispense Refill  . ALPRAZolam (XANAX) 0.25 MG tablet Take 0.25 mg by mouth 2 (two) times daily as needed for anxiety.    Marland Kitchen atorvastatin (LIPITOR) 10 MG tablet Take 1 tablet (10 mg total) by mouth daily.  30 tablet 0  . cholecalciferol (VITAMIN D) 1000 UNITS tablet Take 1,000 Units by mouth every morning.     . citalopram (CELEXA) 10 MG tablet Take 1 tablet (10 mg total) by mouth daily. (Patient taking differently: Take 20 mg by mouth daily. )    . clopidogrel (PLAVIX) 75 MG tablet Take 75 mg by mouth daily.    . clotrimazole-betamethasone (LOTRISONE) cream Apply 1 application topically 2 (two) times daily as needed (Eczema).     . docusate sodium (COLACE) 100 MG capsule Take 100 mg by mouth 2 (two) times daily.    . feeding supplement, GLUCERNA SHAKE, (GLUCERNA SHAKE) LIQD Take 237 mLs by mouth daily as needed.     . furosemide (LASIX) 40 MG tablet Take 1.5 tablets (60 mg total) by mouth daily. (Patient taking differently: Take 40 mg by mouth daily. ) 135 tablet 3  . Glycerin-Polysorbate 80 (REFRESH DRY EYE THERAPY OP) Place 1 drop into both eyes 2 (two) times daily as needed.    . hydrALAZINE (APRESOLINE) 25 MG tablet Take 1 tablet (25 mg total) by mouth 2 (two) times daily. 60 tablet 5  . insulin aspart (NOVOLOG FLEXPEN) 100 UNIT/ML FlexPen Inject 5 Units into the skin 3 (three) times daily with meals. When BG is over 100    . Insulin Glargine (LANTUS SOLOSTAR) 100 UNIT/ML Solostar Pen Inject 8 Units into the skin at bedtime.    . lidocaine (XYLOCAINE) 5 % ointment APPLY 2-3 GRAMS (1 GRAM=1 INCH) TO AFFECTED AREA 4 TIMES DAILY/ As needed  5  . loratadine (CLARITIN) 10 MG tablet Take 10 mg by mouth daily.    Marland Kitchen losartan (COZAAR) 25 MG tablet Take 25 mg by mouth daily.    . Methylfol-Algae-B12-Acetylcyst (CEREFOLIN NAC) 6-90.314-2-600 MG TABS Take 1 tablet by mouth daily.     . metoprolol (LOPRESSOR) 50 MG tablet Take 13m in the a.m.and 282min the pm    . mirtazapine (REMERON) 30 MG tablet Take 30 mg by mouth at bedtime.    . Multiple Vitamin (MULTIVITAMIN) tablet Take 1 tablet by mouth daily. Reported on 02/06/2016    . pantoprazole (PROTONIX) 40 MG tablet Take 1 tablet (40 mg total) by mouth 2  (two) times daily before a meal. 180 tablet 1  . psyllium (METAMUCIL SMOOTH TEXTURE) 28 % packet Take 1 packet by mouth daily.     . Marland Kitchenhyroid (ARMOUR) 15 MG tablet Take 15 mg by mouth daily.    . Marland Kitchenmeclidinium bromide (INCRUSE ELLIPTA) 62.5 MCG/INH AEPB Inhale 1 puff into the lungs daily.    . ursodiol (ACTIGALL) 500 MG tablet TAKE 1 TABLET TWICE A DAY WITH MEALS 180 tablet 3   No current facility-administered medications for this visit.     Allergies as of 12/22/2016 - Review Complete 12/22/2016  Allergen Reaction Noted  . Aricept [donepezil hcl]  02/16/2014  . Namenda [memantine hcl]  02/16/2014  . Donepezil Nausea Only 01/20/2016  . Spironolactone Other (See Comments) 12/16/2015  . Codeine Rash 02/16/2014  . Indocin [indomethacin] Rash 09/11/2014  .  Latex Rash 02/16/2014  . Penicillins Rash 01/11/2007    Family History  Problem Relation Age of Onset  . Heart failure Mother   . Kidney cancer Mother   . Cancer Mother   . Hypertension Mother   . Lung cancer Father     Brain METS  . Cancer Father   . Cirrhosis Brother   . Kidney cancer Sister   . Cancer Sister   . Hypertension Sister   . Heart attack Neg Hx   . Stroke Neg Hx     Social History   Social History  . Marital status: Divorced    Spouse name: N/A  . Number of children: N/A  . Years of education: N/A   Occupational History  . RETIRED FACTORY WORKER     [employer]   Social History Main Topics  . Smoking status: Former Smoker    Packs/day: 0.50    Years: 40.00    Types: Cigarettes    Quit date: 03/30/2013  . Smokeless tobacco: Never Used     Comment: Quit since 03/2013  . Alcohol use No  . Drug use: No  . Sexual activity: No   Other Topics Concern  . 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 Started age  26.   Drug use -no   Alcohol use - no   Lives with son   Education - 12 th grade                   Review of Systems: Complete ROS negative except as per HPI.   Physical Exam: BP 110/61   Pulse 71   Temp 98.1 F (36.7 C) (Oral)   Ht _0  (1.626 m)   Wt 154 lb 3.2 oz (69.9 kg)   BMI 26.47 kg/m  General:   Alert and oriented. Pleasant and cooperative. Well-nourished and well-developed.  Eyes:  Without icterus, sclera clear and conjunctiva pink.  Ears:  Normal auditory acuity. Cardiovascular:  S1, S2 present without murmurs appreciated. Extremities without clubbing or edema. Respiratory:  Clear to auscultation bilaterally. No wheezes, rales, or rhonchi. No distress.  Gastrointestinal:  +BS, soft, non-tender and non-distended. No HSM noted. No guarding or rebound. No masses appreciated.  Rectal:  Deferred  Musculoskalatal:  Symmetrical without gross deformities. Neurologic:  Alert and oriented x4;  grossly normal neurologically. Psych:  Alert and cooperative. Normal mood and affect. Heme/Lymph/Immune: No excessive bruising noted.    12/22/2016 10:46 AM   Disclaimer: This note was dictated with voice recognition software. Similar sounding words can inadvertently be transcribed and may not be corrected upon review.

## 2016-12-22 NOTE — Progress Notes (Signed)
cc'ed to pcp

## 2016-12-22 NOTE — Assessment & Plan Note (Signed)
Noted 5 gastric ulcers on previous endoscopy. Surveillance endoscopy had to be canceled due to patient having pneumonia. She presented today to update her H&P. Her pneumonia is resolved, still has baseline dyspnea on oxygen at home but this is back to her baseline. She has been taking PPI twice daily but has been running out of every month because the prescription does not appear to been updated with her pharmacy provider. I will update this today, proceed with scheduling surveillance endoscopy to evaluate for peptic ulcer disease healing.  Proceed with EGD with Dr. Oneida Alar in near future: the risks, benefits, and alternatives have been discussed with the patient in detail. The patient states understanding and desires to proceed.  The patient is on Xanax, Celexa. No other anticoagulants, anxiolytics, chronic pain medications, or antidepressants. We will forward medication instructions related to her procedure that were previously made for this procedure. Conscious sedation should be adequate for her procedure.  TAKE HALF LANTUS DOSE ON NIGHT PRIOR TO EGD. MAY USE NOVOLOG [name] OF BG DIF [name] IS > 200. SHE SHOULD TAKE HALF HER LOPRESSOR ON THE MORNING OF HER EGD. HOLD HYDRALAZINE [name].

## 2016-12-24 DIAGNOSIS — M79673 Pain in unspecified foot: Secondary | ICD-10-CM | POA: Diagnosis not present

## 2016-12-24 DIAGNOSIS — B351 Tinea unguium: Secondary | ICD-10-CM | POA: Diagnosis not present

## 2016-12-24 DIAGNOSIS — M2010 Hallux valgus (acquired), unspecified foot: Secondary | ICD-10-CM | POA: Diagnosis not present

## 2016-12-29 DIAGNOSIS — E119 Type 2 diabetes mellitus without complications: Secondary | ICD-10-CM | POA: Diagnosis not present

## 2016-12-29 DIAGNOSIS — Z794 Long term (current) use of insulin: Secondary | ICD-10-CM | POA: Diagnosis not present

## 2016-12-29 DIAGNOSIS — Z961 Presence of intraocular lens: Secondary | ICD-10-CM | POA: Diagnosis not present

## 2016-12-29 DIAGNOSIS — H52203 Unspecified astigmatism, bilateral: Secondary | ICD-10-CM | POA: Diagnosis not present

## 2016-12-29 DIAGNOSIS — H5213 Myopia, bilateral: Secondary | ICD-10-CM | POA: Diagnosis not present

## 2016-12-29 DIAGNOSIS — H524 Presbyopia: Secondary | ICD-10-CM | POA: Diagnosis not present

## 2016-12-30 DIAGNOSIS — E1140 Type 2 diabetes mellitus with diabetic neuropathy, unspecified: Secondary | ICD-10-CM | POA: Diagnosis not present

## 2016-12-30 DIAGNOSIS — Z9889 Other specified postprocedural states: Secondary | ICD-10-CM | POA: Diagnosis not present

## 2016-12-30 DIAGNOSIS — Z9012 Acquired absence of left breast and nipple: Secondary | ICD-10-CM | POA: Diagnosis not present

## 2016-12-30 DIAGNOSIS — F329 Major depressive disorder, single episode, unspecified: Secondary | ICD-10-CM | POA: Diagnosis not present

## 2017-01-01 ENCOUNTER — Encounter (HOSPITAL_COMMUNITY)
Admission: RE | Disposition: A | Discharge status: Home / Self Care | Payer: Self-pay | Source: Ambulatory Visit | Attending: Gastroenterology

## 2017-01-01 ENCOUNTER — Encounter (HOSPITAL_COMMUNITY): Payer: Self-pay

## 2017-01-01 ENCOUNTER — Ambulatory Visit (HOSPITAL_COMMUNITY)
Admission: RE | Admit: 2017-01-01 | Discharge: 2017-01-01 | Disposition: A | Discharge status: Home / Self Care | Payer: Medicare Other | Source: Ambulatory Visit | Attending: Gastroenterology | Admitting: Gastroenterology

## 2017-01-01 DIAGNOSIS — K2970 Gastritis, unspecified, without bleeding: Secondary | ICD-10-CM | POA: Diagnosis not present

## 2017-01-01 DIAGNOSIS — Z794 Long term (current) use of insulin: Secondary | ICD-10-CM | POA: Insufficient documentation

## 2017-01-01 DIAGNOSIS — E785 Hyperlipidemia, unspecified: Secondary | ICD-10-CM | POA: Diagnosis not present

## 2017-01-01 DIAGNOSIS — Z87891 Personal history of nicotine dependence: Secondary | ICD-10-CM | POA: Insufficient documentation

## 2017-01-01 DIAGNOSIS — E1142 Type 2 diabetes mellitus with diabetic polyneuropathy: Secondary | ICD-10-CM | POA: Insufficient documentation

## 2017-01-01 DIAGNOSIS — K7460 Unspecified cirrhosis of liver: Secondary | ICD-10-CM | POA: Insufficient documentation

## 2017-01-01 DIAGNOSIS — I5032 Chronic diastolic (congestive) heart failure: Secondary | ICD-10-CM | POA: Insufficient documentation

## 2017-01-01 DIAGNOSIS — F329 Major depressive disorder, single episode, unspecified: Secondary | ICD-10-CM | POA: Diagnosis not present

## 2017-01-01 DIAGNOSIS — F419 Anxiety disorder, unspecified: Secondary | ICD-10-CM | POA: Diagnosis not present

## 2017-01-01 DIAGNOSIS — I110 Hypertensive heart disease with heart failure: Secondary | ICD-10-CM | POA: Insufficient documentation

## 2017-01-01 DIAGNOSIS — K279 Peptic ulcer, site unspecified, unspecified as acute or chronic, without hemorrhage or perforation: Secondary | ICD-10-CM

## 2017-01-01 DIAGNOSIS — I471 Supraventricular tachycardia: Secondary | ICD-10-CM | POA: Insufficient documentation

## 2017-01-01 DIAGNOSIS — I2510 Atherosclerotic heart disease of native coronary artery without angina pectoris: Secondary | ICD-10-CM | POA: Insufficient documentation

## 2017-01-01 DIAGNOSIS — Z79899 Other long term (current) drug therapy: Secondary | ICD-10-CM | POA: Insufficient documentation

## 2017-01-01 DIAGNOSIS — Z853 Personal history of malignant neoplasm of breast: Secondary | ICD-10-CM | POA: Insufficient documentation

## 2017-01-01 DIAGNOSIS — M8580 Other specified disorders of bone density and structure, unspecified site: Secondary | ICD-10-CM | POA: Diagnosis not present

## 2017-01-01 HISTORY — DX: Pneumonia, unspecified organism: J18.9

## 2017-01-01 HISTORY — PX: ESOPHAGOGASTRODUODENOSCOPY: SHX5428

## 2017-01-01 LAB — GLUCOSE, CAPILLARY: Glucose-Capillary: 127 mg/dL — ABNORMAL HIGH (ref 65–99)

## 2017-01-01 SURGERY — EGD (ESOPHAGOGASTRODUODENOSCOPY)
Anesthesia: Moderate Sedation

## 2017-01-01 MED ORDER — SODIUM CHLORIDE 0.9 % IV SOLN
INTRAVENOUS | Status: DC
  Administered 2017-01-01: 08:00:00 via INTRAVENOUS

## 2017-01-01 MED ORDER — LIDOCAINE VISCOUS 2 % MT SOLN
OROMUCOSAL | Status: DC | PRN
  Administered 2017-01-01: 1 via OROMUCOSAL

## 2017-01-01 MED ORDER — LIDOCAINE VISCOUS 2 % MT SOLN
OROMUCOSAL | Status: AC
  Filled 2017-01-01: qty 15

## 2017-01-01 MED ORDER — MIDAZOLAM HCL 5 MG/5ML IJ SOLN
INTRAMUSCULAR | Status: DC | PRN
  Administered 2017-01-01: 1 mg via INTRAVENOUS
  Administered 2017-01-01: 2 mg via INTRAVENOUS

## 2017-01-01 MED ORDER — MEPERIDINE HCL 100 MG/ML IJ SOLN
INTRAMUSCULAR | Status: DC | PRN
  Administered 2017-01-01 (×2): 25 mg via INTRAVENOUS

## 2017-01-01 MED ORDER — MEPERIDINE HCL 100 MG/ML IJ SOLN
INTRAMUSCULAR | Status: AC
  Filled 2017-01-01: qty 2

## 2017-01-01 MED ORDER — MIDAZOLAM HCL 5 MG/5ML IJ SOLN
INTRAMUSCULAR | Status: AC
  Filled 2017-01-01: qty 10

## 2017-01-01 NOTE — H&P (Signed)
Primary Care Physician:  [name], MD Primary Gastroenterologist:  Dr. Oneida Alar  Pre-Procedure History & Physical: HPI:  [name] is a 83 y.o. female here for peptic ulcer disease ON EGD OCT 2017.  Past Medical History:  Diagnosis Date  . Acute delirium 06/23/2013  . Acute diastolic congestive heart failure ([place]) 03/21/2014  . Anxiety   . Carotid artery disease (HCC)    Bilateral ICA occlusion  . Chronic headaches   . Chronic liver disease    ?cirrhosis on 10/2014 CT. H/O elevated alkphos and +AMA on URSO.  Marland Kitchen Coronary atherosclerosis of native coronary artery    Mild nonobstructive disease at cardiac catheterization May 2011  . Depression   . Essential hypertension, benign   . Fracture of femoral neck, right, closed ([place]) 03/28/2014  . History of breast cancer   . History of GI bleed   . Hyperlipidemia   . Orthostatic hypotension    Diabetic polyneuropathy/diabetic dysautonomia   . Osteopenia   . Parotid tumor   . Pneumonia 11/2016  . Pressure ulcer, heel, right, unstageable ([place]) 04/17/2014  . PSVT (paroxysmal supraventricular tachycardia) ([place])   . Syncope and collapse 04/15/2010   Qualifier: Diagnosis of  By: Angelena Form, MD, Harrell Gave    . Type 2 diabetes mellitus ([place])   . UPPER GASTROINTESTINAL HEMORRHAGE 03/21/2007   Qualifier: Diagnosis of  By: Jonna Munro MD, Roderic Scarce      Past Surgical History:  Procedure Laterality Date  . ABDOMINAL HYSTERECTOMY    . APPENDECTOMY    . CHOLECYSTECTOMY  2008  . COLONOSCOPY  2010   Dr. Oneida Alar: single tubular adenoma, one hyperplastic polyp. Next TCS 2020 health permitting.  . ESOPHAGOGASTRODUODENOSCOPY N/A 08/17/2016   Procedure: ESOPHAGOGASTRODUODENOSCOPY (EGD);  Surgeon: Danie Binder, MD;  [facility];  Service: Endoscopy;  Laterality: N/A;  8:45 am  . HEMORRHOID SURGERY  1960s  . HIP ARTHROPLASTY Right 03/28/2014   Procedure: ARTHROPLASTY BIPOLAR HIP;  Surgeon: Carole Civil, MD;  Location: [facility];  Service:  Orthopedics;  Laterality: Right;  . LAPAROSCOPIC APPENDECTOMY N/A 04/13/2013   Procedure: APPENDECTOMY LAPAROSCOPIC;  Surgeon: Donato Heinz, MD;  Location: [facility];  Service: General;  Laterality: N/A;  . MASTECTOMY  1994   Left breast -related to breast cancer  . SAVORY DILATION N/A 08/17/2016   Procedure: SAVORY DILATION;  Surgeon: Danie Binder, MD;  [facility];  Service: Endoscopy;  Laterality: N/A;  . VESICOVAGINAL FISTULA CLOSURE W/ TAH  1979    Prior to Admission medications   Medication Sig Start Date End Date Taking? Authorizing Provider  acetaminophen (TYLENOL) 500 MG tablet Take 500 mg by mouth every 8 (eight) hours as needed for mild pain or moderate pain.   Yes Historical Provider, MD  ALPRAZolam (XANAX) 0.25 MG tablet Take 0.25 mg by mouth 2 (two) times daily as needed for anxiety.   Yes Historical Provider, MD  atorvastatin (LIPITOR) 10 MG tablet Take 1 tablet (10 mg total) by mouth daily. 02/20/14  Yes [name], MD  cholecalciferol (VITAMIN D) 1000 UNITS tablet Take 1,000 Units by mouth every morning.    Yes Historical Provider, MD  citalopram (CELEXA) 10 MG tablet Take 1 tablet (10 mg total) by mouth daily. Patient taking differently: Take 20 mg by mouth daily.  04/20/14  Yes Kathie Dike, MD  clopidogrel (PLAVIX) 75 MG tablet Take 75 mg by mouth daily.   Yes Historical Provider, MD  clotrimazole-betamethasone (LOTRISONE) cream Apply 1 application topically 2 (two)  times daily as needed (Eczema).  10/26/16  Yes Historical Provider, MD  docusate sodium (COLACE) 100 MG capsule Take 100 mg by mouth 2 (two) times daily.   Yes Historical Provider, MD  feeding supplement, GLUCERNA SHAKE, (GLUCERNA SHAKE) LIQD Take 237 mLs by mouth daily as needed.    Yes Historical Provider, MD  furosemide (LASIX) 40 MG tablet Take 1.5 tablets (60 mg total) by mouth daily. Patient taking differently: Take 40 mg by mouth daily.  02/18/16  Yes Minus Breeding, MD  Glycerin-Polysorbate  80 (REFRESH DRY EYE THERAPY OP) Place 1 drop into both eyes 2 (two) times daily as needed.   Yes Historical Provider, MD  hydrALAZINE (APRESOLINE) 25 MG tablet Take 1 tablet (25 mg total) by mouth 2 (two) times daily. 12/16/15  Yes Imogene Burn, PA-C  insulin aspart (NOVOLOG FLEXPEN) 100 UNIT/ML FlexPen Inject 5 Units into the skin 3 (three) times daily with meals. When BG is over 100   Yes Historical Provider, MD  Insulin Glargine (LANTUS SOLOSTAR) 100 UNIT/ML Solostar Pen Inject 8 Units into the skin at bedtime.   Yes Historical Provider, MD  lidocaine (XYLOCAINE) 5 % ointment APPLY 2-3 GRAMS (1 GRAM=1 INCH) TO AFFECTED AREA 4 TIMES DAILY/ As needed 04/20/16  Yes Historical Provider, MD  loratadine (CLARITIN) 10 MG tablet Take 10 mg by mouth daily.   Yes Historical Provider, MD  losartan (COZAAR) 25 MG tablet Take 25 mg by mouth daily.   Yes Historical Provider, MD  Methylfol-Algae-B12-Acetylcyst (CEREFOLIN NAC) 6-90.314-2-600 MG TABS Take 1 tablet by mouth daily.    Yes Historical Provider, MD  metoprolol (LOPRESSOR) 50 MG tablet Take 56m in the a.m.and 247min the pm 12/16/15  Yes MiImogene BurnPA-C  mirtazapine (REMERON) 30 MG tablet Take 30 mg by mouth at bedtime.   Yes Historical Provider, MD  Multiple Vitamin (MULTIVITAMIN) tablet Take 1 tablet by mouth daily. Reported on 02/06/2016   Yes Historical Provider, MD  pantoprazole (PROTONIX) 40 MG tablet Take 1 tablet (40 mg total) by mouth 2 (two) times daily before a meal. 12/22/16  Yes ErCarlis StableNP  psyllium (METAMUCIL SMOOTH TEXTURE) 28 % packet Take 1 packet by mouth 2 (two) times daily as needed.    Yes Historical Provider, MD  thyroid (ARMOUR) 15 MG tablet Take 15 mg by mouth daily.   Yes Historical Provider, MD  umeclidinium bromide (INCRUSE ELLIPTA) 62.5 MCG/INH AEPB Inhale 1 puff into the lungs daily.   Yes Historical Provider, MD  ursodiol (ACTIGALL) 500 MG tablet TAKE 1 TABLET TWICE A DAY WITH MEALS 10/07/16  Yes ErCarlis StableNP     Allergies as of 12/22/2016 - Review Complete 12/22/2016  Allergen Reaction Noted  . Aricept [donepezil hcl]  02/16/2014  . Namenda [memantine hcl]  02/16/2014  . Donepezil Nausea Only 01/20/2016  . Spironolactone Other (See Comments) 12/16/2015  . Codeine Rash 02/16/2014  . Indocin [indomethacin] Rash 09/11/2014  . Latex Rash 02/16/2014  . Penicillins Rash 01/11/2007    Family History  Problem Relation Age of Onset  . Heart failure Mother   . Kidney cancer Mother   . Cancer Mother   . Hypertension Mother   . Lung cancer Father     Brain METS  . Cancer Father   . Cirrhosis Brother   . Kidney cancer Sister   . Cancer Sister   . Hypertension Sister   . Heart attack Neg Hx   . Stroke Neg Hx  Social History   Social History  . Marital status: Divorced    Spouse name: N/A  . Number of children: N/A  . Years of education: N/A   Occupational History  . RETIRED FACTORY WORKER     [employer]   Social History Main Topics  . Smoking status: Former Smoker    Packs/day: 0.50    Years: 40.00    Types: Cigarettes    Quit date: 03/30/2013  . Smokeless tobacco: Never Used     Comment: Quit since 03/2013  . Alcohol use No  . Drug use: No  . Sexual activity: No   Other Topics Concern  . 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 Started age 4.   Drug use -no   Alcohol use - no   Lives with son   Education - 12 th grade                   Review of Systems: See HPI, otherwise negative ROS   Physical Exam: BP (!) 173/65   Pulse 63   Temp 99 F (37.2 C) (Oral)   Resp (!) 25   SpO2 91%  General:   Alert,  pleasant and cooperative in NAD Head:  Normocephalic and atraumatic. Neck:  Supple; Lungs:  Clear throughout to auscultation.    Heart:  Regular rate and  rhythm. Abdomen:  Soft, nontender and nondistended. Normal bowel sounds, without guarding, and without rebound.   Neurologic:  Alert and  oriented x4;  grossly normal neurologically.  Impression/Plan:     PUD  PLAN:  REPEAT EGD TO CONFIRM ULCERS ARE HEALED. DISCUSSED PROCEDURE, BENEFITS, & RISKS: < 1% chance of medication reaction, bleeding, OR perforation.

## 2017-01-01 NOTE — Op Note (Signed)
[facility] Patient Name: [name] Procedure Date: 01/01/2017 9:29 AM MRN: [id number] Date of Birth: [birth year] Attending MD: Barney Drain , MD CSN: [id number] Age: 83 Admit Type: Outpatient Procedure:                Upper GI endoscopy, DIAGNOSTIC Indications:              Surveillance procedure Providers:                Barney Drain, MD, Lurline Del, RN, Purcell Nails. [place],                            Merchant navy officer Referring MD:              Medicines:                Meperidine 50 mg IV, Midazolam 3 mg IV Complications:            No immediate complications. Estimated Blood Loss:     Estimated blood loss: none. Procedure:                Pre-Anesthesia Assessment:                           - Prior to the procedure, a History and Physical                            was performed, and patient medications and                            allergies were reviewed. The patient's tolerance of                            previous anesthesia was also reviewed. The risks                            and benefits of the procedure and the sedation                            options and risks were discussed with the patient.                            All questions were answered, and informed consent                            was obtained. Prior Anticoagulants: The patient has                            taken Plavix (clopidogrel), last dose was 1 day                            prior to procedure. ASA Grade Assessment: II - A                            patient with mild systemic disease. After reviewing  the risks and benefits, the patient was deemed in                            satisfactory condition to undergo the procedure.                           After obtaining informed consent, the endoscope was                            passed under direct vision. Throughout the                            procedure, the patient's blood pressure, pulse, and    oxygen saturations were monitored continuously. The                            EG-[id number] ([id number]) scope was introduced through the                            mouth, and advanced to the second part of duodenum.                            The upper GI endoscopy was accomplished without                            difficulty. The patient tolerated the procedure                            well. Scope In: 9:45:48 AM Scope Out: 9:49:44 AM Total Procedure Duration: 0 hours 3 minutes 56 seconds  Findings:      The examined esophagus was normal.      Patchy mild inflammation characterized by congestion (edema) and       erythema was found in the gastric antrum.      The examined duodenum was normal. Impression:               - Normal esophagus.                           - Gastritis.                           - Normal examined duodenum.                           - No specimens collected. Moderate Sedation:      Moderate (conscious) sedation was administered by the endoscopy nurse       and supervised by the endoscopist. The following parameters were       monitored: oxygen saturation, heart rate, blood pressure, and response       to care. Total physician intraservice time was 13 minutes. Recommendation:           - Resume previous diet.                           - Continue present medications.                           -  Return to my office in 6 months.                           - Patient has a contact number available for                            emergencies. The signs and symptoms of potential                            delayed complications were discussed with the                            patient. Return to normal activities tomorrow.                            Written discharge instructions were provided to the                            patient. Procedure Code(s):        --- Professional ---                           [phone], Esophagogastroduodenoscopy, flexible,                             transoral; diagnostic, including collection of                            specimen(s) by brushing or washing, when performed                            (separate procedure)                           99152, Moderate sedation services provided by the                            same physician or other qualified health care                            professional performing the diagnostic or                            therapeutic service that the sedation supports,                            requiring the presence of an independent trained                            observer to assist in the monitoring of the                            patient's level of consciousness and physiological  status; initial 15 minutes of intraservice time,                            patient age 32 years or older Diagnosis Code(s):        --- Professional ---                           K29.70, Gastritis, unspecified, without bleeding CPT copyright 2016 [employer]. All rights reserved. The codes documented in this report are preliminary and upon coder review may  be revised to meet current compliance requirements. Barney Drain, MD Barney Drain, MD 01/01/2017 10:06:49 AM This report has been signed electronically. Number of Addenda: 0

## 2017-01-01 NOTE — Discharge Instructions (Signed)
You have mild gastritis. YOUR ULCERS ARE HEALED.   CONTINUE PROTONIX. TAKE 30 MINUTES PRIOR TO BREAKFAST.  FOLLOW UP IN 4 MOS.  UPPER ENDOSCOPY AFTER CARE Read the instructions outlined below and refer to this sheet in the next week. These discharge instructions provide you with general information on caring for yourself after you leave the hospital. While your treatment has been planned according to the most current medical practices available, unavoidable complications occasionally occur. If you have any problems or questions after discharge, call DR. Kareem Cathey, [phone].  ACTIVITY  You may resume your regular activity, but move at a slower pace for the next 24 hours.   Take frequent rest periods for the next 24 hours.   Walking will help get rid of the air and reduce the bloated feeling in your belly (abdomen).   No driving for 24 hours (because of the medicine (anesthesia) used during the test).   You may shower.   Do not sign any important legal documents or operate any machinery for 24 hours (because of the anesthesia used during the test).    NUTRITION  Drink plenty of fluids.   You may resume your normal diet as instructed by your doctor.   Begin with a light meal and progress to your normal diet. Heavy or fried foods are harder to digest and may make you feel sick to your stomach (nauseated).   Avoid alcoholic beverages for 24 hours or as instructed.    MEDICATIONS  You may resume your normal medications.   WHAT YOU CAN EXPECT TODAY  Some feelings of bloating in the abdomen.   Passage of more gas than usual.    IF YOU HAD A BIOPSY TAKEN DURING THE UPPER ENDOSCOPY:  Eat a soft diet IF YOU HAVE NAUSEA, BLOATING, ABDOMINAL PAIN, OR VOMITING.    FINDING OUT THE RESULTS OF YOUR TEST Not all test results are available during your visit. DR. Oneida Alar WILL CALL YOU WITHIN 7 DAYS OF YOUR PROCEDUE WITH YOUR RESULTS. Do not assume everything is normal if you have  not heard from DR. Eldean Klatt IN ONE WEEK, CALL HER OFFICE AT [phone].  SEEK IMMEDIATE MEDICAL ATTENTION AND CALL THE OFFICE: [phone] IF:  You have more than a spotting of blood in your stool.   Your belly is swollen (abdominal distention).   You are nauseated or vomiting.   You have a temperature over 101F.   You have abdominal pain or discomfort that is severe or gets worse throughout the day.   Gastritis  Gastritis is an inflammation (the body's way of reacting to injury and/or infection) of the stomach. It is often caused by viral or bacterial (germ) infections. It can also be caused BY ASPIRIN, BC/GOODY POWDER'S, (IBUPROFEN) MOTRIN, OR ALEVE (NAPROXEN), chemicals (including alcohol), SPICY FOODS, and medications. This illness may be associated with generalized malaise (feeling tired, not well), UPPER ABDOMINAL STOMACH cramps, and fever. One common bacterial cause of gastritis is an organism known as H. Pylori. This can be treated with antibiotics.

## 2017-01-06 ENCOUNTER — Ambulatory Visit: Payer: Medicare Other | Admitting: Gastroenterology

## 2017-01-06 ENCOUNTER — Encounter (HOSPITAL_COMMUNITY): Payer: Self-pay | Admitting: Gastroenterology

## 2017-02-08 ENCOUNTER — Other Ambulatory Visit (HOSPITAL_COMMUNITY): Payer: Self-pay | Admitting: Internal Medicine

## 2017-02-08 DIAGNOSIS — Z1231 Encounter for screening mammogram for malignant neoplasm of breast: Secondary | ICD-10-CM

## 2017-02-09 DIAGNOSIS — E119 Type 2 diabetes mellitus without complications: Secondary | ICD-10-CM | POA: Diagnosis not present

## 2017-02-09 DIAGNOSIS — I10 Essential (primary) hypertension: Secondary | ICD-10-CM | POA: Diagnosis not present

## 2017-02-15 ENCOUNTER — Ambulatory Visit (HOSPITAL_COMMUNITY)
Admission: RE | Admit: 2017-02-15 | Discharge: 2017-02-15 | Disposition: A | Discharge status: Home / Self Care | Payer: Medicare Other | Source: Ambulatory Visit | Attending: Internal Medicine | Admitting: Internal Medicine

## 2017-02-15 DIAGNOSIS — Z1231 Encounter for screening mammogram for malignant neoplasm of breast: Secondary | ICD-10-CM

## 2017-02-16 DIAGNOSIS — I6529 Occlusion and stenosis of unspecified carotid artery: Secondary | ICD-10-CM | POA: Diagnosis not present

## 2017-02-16 DIAGNOSIS — E118 Type 2 diabetes mellitus with unspecified complications: Secondary | ICD-10-CM | POA: Diagnosis not present

## 2017-02-16 DIAGNOSIS — R229 Localized swelling, mass and lump, unspecified: Secondary | ICD-10-CM | POA: Diagnosis not present

## 2017-02-16 DIAGNOSIS — R0609 Other forms of dyspnea: Secondary | ICD-10-CM | POA: Diagnosis not present

## 2017-02-23 DIAGNOSIS — Z853 Personal history of malignant neoplasm of breast: Secondary | ICD-10-CM | POA: Diagnosis not present

## 2017-02-23 DIAGNOSIS — R2232 Localized swelling, mass and lump, left upper limb: Secondary | ICD-10-CM | POA: Diagnosis not present

## 2017-02-25 DIAGNOSIS — I440 Atrioventricular block, first degree: Secondary | ICD-10-CM | POA: Diagnosis not present

## 2017-02-25 DIAGNOSIS — I5032 Chronic diastolic (congestive) heart failure: Secondary | ICD-10-CM | POA: Diagnosis not present

## 2017-02-25 DIAGNOSIS — R0602 Shortness of breath: Secondary | ICD-10-CM | POA: Diagnosis not present

## 2017-02-25 DIAGNOSIS — R9431 Abnormal electrocardiogram [ECG] [EKG]: Secondary | ICD-10-CM | POA: Diagnosis not present

## 2017-03-01 ENCOUNTER — Other Ambulatory Visit: Payer: Self-pay | Admitting: Surgery

## 2017-03-01 DIAGNOSIS — N63 Unspecified lump in unspecified breast: Secondary | ICD-10-CM

## 2017-03-04 DIAGNOSIS — B351 Tinea unguium: Secondary | ICD-10-CM | POA: Diagnosis not present

## 2017-03-04 DIAGNOSIS — M79674 Pain in right toe(s): Secondary | ICD-10-CM | POA: Diagnosis not present

## 2017-03-04 DIAGNOSIS — E1149 Type 2 diabetes mellitus with other diabetic neurological complication: Secondary | ICD-10-CM | POA: Diagnosis not present

## 2017-03-05 DIAGNOSIS — R9431 Abnormal electrocardiogram [ECG] [EKG]: Secondary | ICD-10-CM | POA: Diagnosis not present

## 2017-03-05 DIAGNOSIS — E119 Type 2 diabetes mellitus without complications: Secondary | ICD-10-CM | POA: Diagnosis not present

## 2017-03-05 DIAGNOSIS — I10 Essential (primary) hypertension: Secondary | ICD-10-CM | POA: Diagnosis not present

## 2017-03-05 DIAGNOSIS — R0602 Shortness of breath: Secondary | ICD-10-CM | POA: Diagnosis not present

## 2017-03-10 DIAGNOSIS — R0989 Other specified symptoms and signs involving the circulatory and respiratory systems: Secondary | ICD-10-CM | POA: Diagnosis not present

## 2017-03-10 DIAGNOSIS — R0602 Shortness of breath: Secondary | ICD-10-CM | POA: Diagnosis not present

## 2017-03-10 DIAGNOSIS — R9431 Abnormal electrocardiogram [ECG] [EKG]: Secondary | ICD-10-CM | POA: Diagnosis not present

## 2017-03-10 DIAGNOSIS — I10 Essential (primary) hypertension: Secondary | ICD-10-CM | POA: Diagnosis not present

## 2017-03-12 DIAGNOSIS — R0602 Shortness of breath: Secondary | ICD-10-CM | POA: Diagnosis not present

## 2017-03-22 DIAGNOSIS — R0602 Shortness of breath: Secondary | ICD-10-CM | POA: Diagnosis not present

## 2017-03-22 DIAGNOSIS — I440 Atrioventricular block, first degree: Secondary | ICD-10-CM | POA: Diagnosis not present

## 2017-03-22 DIAGNOSIS — R9431 Abnormal electrocardiogram [ECG] [EKG]: Secondary | ICD-10-CM | POA: Diagnosis not present

## 2017-03-22 DIAGNOSIS — I5032 Chronic diastolic (congestive) heart failure: Secondary | ICD-10-CM | POA: Diagnosis not present

## 2017-03-24 ENCOUNTER — Ambulatory Visit
Admission: RE | Admit: 2017-03-24 | Discharge: 2017-03-24 | Disposition: A | Discharge status: Home / Self Care | Payer: Medicare Other | Source: Ambulatory Visit | Attending: Surgery | Admitting: Surgery

## 2017-03-24 ENCOUNTER — Other Ambulatory Visit: Payer: Self-pay | Admitting: Surgery

## 2017-03-24 DIAGNOSIS — N63 Unspecified lump in unspecified breast: Secondary | ICD-10-CM

## 2017-03-24 DIAGNOSIS — R2232 Localized swelling, mass and lump, left upper limb: Secondary | ICD-10-CM | POA: Diagnosis not present

## 2017-04-07 DIAGNOSIS — I5032 Chronic diastolic (congestive) heart failure: Secondary | ICD-10-CM | POA: Diagnosis not present

## 2017-04-12 NOTE — H&P (Signed)
OFFICE VISIT NOTES COPIED TO EPIC FOR DOCUMENTATION  . History of Present Illness [name]; 03/26/2017 9:26 PM) Patient words: Last OV 02/25/2017; FU nuc, echo, carotid and Labs, pt states she has had gas, bloating, swelling in ankles and lower legs since starting valsartan, swelling goes down after taking 1/2 tab of Lasix.  The patient is a 83 year old female who presents for a Follow-up for Shortness of breath. Patient with hypertension, hyperlipidemia, diabetes mellitus who was referred to me for evaluation of recent worsening dyspnea that started about 2 months ago. Patient has history of breast cancer, status post radical mastectomy and chemotherapy but no radiation therapy. She has decreased her physical activity as per daughter at the bedside. Minimal activities bring her dyspnea. No associated chest pain or chest tightness she takes furosemide on a daily basis for leg edema. No dizziness or syncope. No palpitations. No recent weight changes. She was told to have carotid artery stenosis, but not had any follow-up since 2016. Diabetes is well controlled.  Given her symptoms and history, she underwent echocardiogram, lexiscan stress testing, and carotid duplex and now presents for follow up. She was started on Valsartan HCTZ at her last office visit. She continues to note lower extremity edema and has also noted abdominal bloating. Patient states that she feels good, she has returned to doing activities around her house such as cleaning and cooking without difficulty. She does still have DOE that has remained stable. Daughter is present at bedside.   Problem List/Past Medical [name]; 03/22/2017 12:49 PM) Pulmonary hypertension, moderate to severe (I27.20)  Echocardiogram 03/10/2017: Echocardiogram 03/10/2017: Left ventricle cavity is small. Mild concentric hypertrophy of the left ventricle. Abnormal septal wall motion due to right ventricular volume overload.  Doppler evidence of grade I (impaired) diastolic dysfunction, elevated LAP. Calculated EF 64%. Left atrial cavity is mildly dilated at 4.1 cm. Right atrial cavity is moderate to severely dilated. Right ventricle cavity is moderately dilated. Mildly reduced right ventricular function. Trace mitral regurgitation. Moderate to severe tricuspid regurgitation. Severe pulmonary hypertension. Pulmonary artery systolic pressure is estimated at 80 mm Hg. Mild to moderate pulmonic regurgitation. IVC is dilated with poor inspiration collapse consistent with elevated right atrial pressure. Carotid stenosis, bilateral (I65.23)  100% and 50%  Carotid artery duplex 03/10/2017: Bilateral internal carotid artery flow appears to be sluggish and may suggest complete occlusion. Left vertebral artery flow is undetectable (no flow). Antegrade right vertebral artery flow. Consider different modality of imaging to better define anatomy. COPD (chronic obstructive pulmonary disease) (J44.9)  Shortness of breath on exertion (R06.02)  Lexiscan myoview stress test 03/05/2017: 1. The resting electrocardiogram demonstrated normal sinus rhythm, normal resting conduction and ST depression with T inversion in the inferior and lateral leads. Stress EKG is nondiagnostic for ischemia due to pharmacologic stress testing with Lexiscan, however there was additional 1.5 mm ST depression in the same leads with pharmacologic stress. Status symptoms included dyspnea and dizziness. 2. Myocardial perfusion imaging is normal. Overall left ventricular systolic function was normal without regional wall motion abnormalities. The left ventricular ejection fraction was 90%. This is a low risk study. Clinical correlation recommended in view of EKG abnomalities. Labwork  03/12/2017: Creatinine 0.73, potassium 4.0, BMP normal. BNP 625.6 Labs 02/09/2017: HB 13.3/HCT 38.3, normal indicis, platelets 197. Serum glucose 110 mg, BUN 14, creatinine 0.8,  potassium 3.8, CMP normal. A1c 7.0%. Total cholesterol 156, triglycerides 70, HDL 75, LDL 67. Benign essential hypertension (I10)  Controlled type 2 diabetes mellitus  without complication, with long-term current use of insulin (E11.9)  1st degree AV block (I44.0)  Abnormal EKG (R94.31)  Chronic diastolic heart failure (Q76.19)  Bilateral carotid bruits (R09.89)   Allergies [name]; 04/13/2017 12:49 PM) Penicillins  Aricept *PSYCHOTHERAPEUTIC AND NEUROLOGICAL AGENTS - MISC.*  Namenda *PSYCHOTHERAPEUTIC AND NEUROLOGICAL AGENTS - MISC.*  Donepezil HCl *PSYCHOTHERAPEUTIC AND NEUROLOGICAL AGENTS - MISC.*  Spironolactone *DIURETICS*  Dizziness. Codeine/Codeine Derivatives  Indocin *ANALGESICS - ANTI-INFLAMMATORY*  Latex   Family History [name]; 04/13/17 12:49 PM) Mother  Deceased. at age 68, CHF Father  Deceased. at age 80 from cancer (lung and brain) Sister 1  Deceased. at age 88, kidney cancer, no heart issues Brother 1  Deceased. at age 57, cirrohsis of liver, no heart issues  Social History [name]; 04-13-17 12:49 PM) Current tobacco use  Former smoker. quit in 2014 Non Drinker/No Alcohol Use  Marital status  Divorced. Living Situation  Lives with relatives. Number of Children  4.  Past Surgical History [name]; 04/13/2017 12:49 PM) Cholecystectomy [2012]: Appendectomy [2014]: Mastectomy; Subtotal - Left [1984]: Hysterectomy; Total [1980]:  Medication History [name]; 04/13/2017 12:59 PM) Valsartan-Hydrochlorothiazide (160-25MG Tablet, 1 (one) Tablet Oral every morning, Taken starting 02/25/2017) Active. (Discontinue Losartan) Furosemide (40MG Tablet, 1/2 Oral daily, Taken starting 02/25/2017) Active. (Change to PRN. Add Valsartan HCT) ALPRAZolam (0.25MG Tablet, 1 Oral as needed) Active. Atorvastatin Calcium (10MG Tablet, 1 Oral daily) Active. Cerefolin NAC (6-90.314-2-600MG Tablet, 1 Oral daily)  Active. Vitamin D (Cholecalciferol) (1000UNIT Tablet, 1 Oral daily) Active. Citalopram Hydrobromide (10MG Tablet, 1 Oral daily) Active. Clopidogrel Bisulfate (75MG Tablet, 1 Oral daily) Active. Clotrimazole-Betamethasone (1-0.05% Cream, 2 times daily External as needed for eczema) Active. Docusate Sodium (100MG Capsule, 1 Oral two times daily) Active. HydrALAZINE HCl (25MG Tablet, 1 Oral two times daily) Active. Incruse Ellipta (62.5MCG/INH Aero Pow Br Act, 1 puff Inhalation daily) Active. Lantus (100UNIT/ML Solution, 8 units Subcutaneous at bedtime) Active. Lidocaine (5% Ointment, External) Active. Loratadine (10MG (Rapid) Tablet, 1 Oral daily) Active. Metoprolol Tartrate (50MG Tablet, 1 in am Oral 1 in pm) Active. Mirtazapine (30MG Tablet, 1 Oral at bedtime) Active. Multivital (1 Oral daily) Active. NovoLOG FlexPen (100UNIT/ML Soln Pen-inj, 5 units Subcutaneous three times daily as needed) Active. Pantoprazole Sodium (40MG Tablet DR, 1 Oral two times daily) Active. Metamucil Smooth Texture (28% Packet, 1 Oral daily) Active. Armour Thyroid (15MG Tablet, 1 Oral daily) Active. Ursodiol (500MG Tablet, 1 Oral two times daily) Active. Probiotic Advanced (1 Oral daily) Specific strength unknown - Active. Systane (0.4-0.3% Solution, Ophthalmic as needed) Active. Medications Reconciled (verbally with list)  Diagnostic Studies History [name]; Apr 13, 2017 12:49 PM) Echocardiogram [03/10/2017]: Left ventricle cavity is small. Mild concentric hypertrophy of the left ventricle. Abnormal septal wall motion due to right ventricular volume overload. Doppler evidence of grade I (impaired) diastolic dysfunction, elevated LAP. Calculated EF 64%. Left atrial cavity is mildly dilated at 4.1 cm. Right atrial cavity is moderate to severely dilated. Right ventricle cavity is moderately dilated. Mildly reduced right ventricular function. Trace mitral regurgitation. Moderate to  severe tricuspid regurgitation. Severe pulmonary hypertension. Pulmonary artery systolic pressure is estimated at 80 mm Hg. Mild to moderate pulmonic regurgitation. IVC is dilated with poor inspiration collapse consistent with elevated right atrial pressure. Nuclear stress test [03/05/2017]: 1. The resting electrocardiogram demonstrated normal sinus rhythm, normal resting conduction and ST depression with T inversion in the inferior and lateral leads. Stress EKG is nondiagnostic for ischemia due to pharmacologic stress testing with Lexiscan, however there was additional 1.5  mm ST depression in the same leads with pharmacologic stress. Status symptoms included dyspnea and dizziness. 2. Myocardial perfusion imaging is normal. Overall left ventricular systolic function was normal without regional wall motion abnormalities. The left ventricular ejection fraction was 90%. This is a low risk study. Clinical correlation recommended in view of EKG abnomalities. Carotid Doppler [03/10/2017]: Bilateral internal carotid artery flow appears to be sluggish and may suggest complete occlusion. Left vertebral artery flow is undetectable (no flow). Antegrade right vertebral artery flow. Consider different modality of imaging to better define anatomy. Labwork  Labs 02/09/2017: HB 13.3/HCT 38.3, normal indicis, platelets 197. Serum glucose 110 mg, BUN 14, creatinine 0.8, potassium 3.8, CMP normal. A1c 7.0%. Total cholesterol 156, triglycerides 70, HDL 75, LDL 67.    Review of Systems Michele Maine, Chambers; 03/26/2017 9:26 PM) General Present- Tiredness. Not Present- Appetite Loss and Weight Gain. Respiratory Present- Decreased Exercise Tolerance and Difficulty Breathing on Exertion. Not Present- Chronic Cough, Sputum Production, Wakes up from Sleep Wheezing or Short of Breath and Wheezing. Gastrointestinal Not Present- Black, Tarry Stool and Difficulty Swallowing. Musculoskeletal Not Present- Decreased Range of  Motion and Muscle Atrophy. Neurological Not Present- Attention Deficit. Psychiatric Not Present- Personality Changes and Suicidal Ideation. Endocrine Not Present- Cold Intolerance and Heat Intolerance. Hematology Not Present- Abnormal Bleeding. All other systems negative  Vitals [name]; 03/22/2017 1:01 PM) 03/22/2017 12:54 PM Weight: 152.38 lb Height: 64in Body Surface Area: 1.74 m Body Mass Index: 26.15 kg/m  Pulse: 55 (Regular)  P.OX: 95% (Room air) BP: 139/65 (Sitting, Left Arm, Standard)       Physical Exam [name]; 03/22/2017 2:19 PM) General Mental Status-Alert. General Appearance-Cooperative and Appears stated age. Build & Nutrition-Well nourished and Moderately built.  Head and Neck Thyroid Gland Characteristics - normal size and consistency and no palpable nodules.  Chest and Lung Exam Chest and lung exam reveals -quiet, even and easy respiratory effort with no use of accessory muscles, non-tender and on auscultation, normal breath sounds, no adventitious sounds.  Cardiovascular Cardiovascular examination reveals -normal heart sounds, regular rate and rhythm with no murmurs. Inspection Jugular vein - Right - Distended. Palpation/Percussion Apical Impulse - Location - 5th Left Intercostal Space Lateral to Midclavicular Line. Amplitude - Markedly increased.  Abdomen Palpation/Percussion Normal exam - Non Tender and No hepatosplenomegaly.  Peripheral Vascular Lower Extremity Inspection - Bilateral - Inspection Normal. Palpation - Edema - Bilateral - No edema. Femoral pulse - Bilateral - Normal. Popliteal pulse - Bilateral - Normal. Dorsalis pedis pulse - Bilateral - 1+. Posterior tibial pulse - Bilateral - Feeble. Carotid arteries - Bilateral-Harsh Bruit(Left is louder.).  Neurologic Neurologic evaluation reveals -alert and oriented x 3 with no impairment of recent or remote memory. Motor-Grossly intact without  any focal deficits.  Musculoskeletal Global Assessment Left Lower Extremity - no deformities, masses or tenderness, no known fractures. Right Lower Extremity - no deformities, masses or tenderness, no known fractures.    Assessment & Plan [name]; 03/26/2017 9:26 PM) Shortness of breath on exertion (R06.02) Story: Lexiscan myoview stress test 03/05/2017: 1. The resting electrocardiogram demonstrated normal sinus rhythm, normal resting conduction and ST depression with T inversion in the inferior and lateral leads. Stress EKG is nondiagnostic for ischemia due to pharmacologic stress testing with Lexiscan, however there was additional 1.5 mm ST depression in the same leads with pharmacologic stress. Status symptoms included dyspnea and dizziness. 2. Myocardial perfusion imaging is normal. Overall left ventricular systolic function was normal without regional  wall motion abnormalities. The left ventricular ejection fraction was 90%. This is a low risk study. Clinical correlation recommended in view of EKG abnomalities. Abnormal EKG (R94.31) Impression: EKG 02/25/2017: Sinus rhythm with marked first-degree AV block at the rate of 73 bpm, biatrial enlargement, ST depression in the inferior and anterolateral 8, cannot exclude ischemia. Chronic diastolic heart failure (F75.10) Future Plans 2/58/5277: METABOLIC PANEL, BASIC (82423) - one time 04/02/2017: CBC & PLATELETS (AUTO) (53614) - one time 04/02/2017: PT (PROTHROMBIN TIME) (43154) - one time 1st degree AV block (I44.0) Benign essential hypertension (I10) Controlled type 2 diabetes mellitus without complication, with long-term current use of insulin (E11.9) Labwork Story: 04/08/2017: Creatinine 0.72, sodium 1:30, potassium 3.6, BMP normal.  WBC 3.2, normal H&H, MCV 98, MCH 33.6, CBC otherwise normal.  INR 1.1, prothrombin time 11.2. 03/12/2017: Creatinine 0.73, potassium 4.0, BMP normal. BNP 625.6  Labs 02/09/2017: HB 13.3/HCT 38.3,  normal indicis, platelets 197. Serum glucose 110 mg, BUN 14, creatinine 0.8, potassium 3.8, CMP normal. A1c 7.0%. Total cholesterol 156, triglycerides 70, HDL 75, LDL 67. Bilateral carotid bruits (R09.89) Current Plans Mechanism of underlying disease process and action of medications discussed with the patient. I discussed primary/secondary prevention and also dietary counceling was done. Patient process for follow-up after testing. Test results were discussed with patient, patient was reassured. Daughter is present at bedside. Lexiscan stress testing showed normal perfusion, but she was noted to have 1.5 mm ST depression. Test was considered low risk; however, recommended clinical correlation given her EKG findings. Patient denies any chest pain. Echocardiogram was abnormal with evidence of RV strain, severe pulmonary hypertension, preserved EF with mild concentric hypertrophy of the left ventricle. Patient does report lower extremity edema and bloating. JVD is present on clinical exam.I have discussed the case with Dr. Einar Gip. Given these findings, we recommended that she undergo both left and right heart catheterization for furtherevaluation. Schedule for cardiac catheterization, and possible angioplasty. We discussed regarding risks, benefits, alternatives to this including stress testing, CTA and continued medical therapy. Patient wants to proceed. Understands <1-2% risk of death, stroke, MI, urgent CABG, bleeding, infection, renal failure but not limited to these. Daughter and patient verbalize understanding and wish to proceed.Lab results were reviewed and discussed. BNP was noted be elevated at 625. I've advised her to take Lasix as needed. She continues to have shortness of breath on exertion; however, reports that this has improved slightly and is now able to maintain normal activities of daily living and is actually cleaning her house now.  Blood pressure is much better controlled  today, with the addition of valsartanhydrochlorothiazide. Carotid duplex did show sluggish flow that could be suggestive of complete occlusion, further imaging is recommended. She is asymptomatic. She will need CT angiogram of the neck for further evaluation, but will hold off until she can undergo catheterization. I will have her follow-up in one month for reevaluation or sooner if problems present.  *I have discussed this case with Dr. Einar Gip and he participated in formulating the plan.*  CC Dr. [name].  Signed electronically by Michele Maine, Chambers (03/26/2017 9:27 PM)

## 2017-04-13 ENCOUNTER — Ambulatory Visit (HOSPITAL_COMMUNITY)
Admission: RE | Admit: 2017-04-13 | Discharge: 2017-04-13 | Disposition: A | Discharge status: Home / Self Care | Payer: Medicare Other | Source: Ambulatory Visit | Attending: Cardiology | Admitting: Cardiology

## 2017-04-13 ENCOUNTER — Encounter (HOSPITAL_COMMUNITY)
Admission: RE | Disposition: A | Discharge status: Home / Self Care | Payer: Self-pay | Source: Ambulatory Visit | Attending: Cardiology

## 2017-04-13 DIAGNOSIS — I110 Hypertensive heart disease with heart failure: Secondary | ICD-10-CM | POA: Insufficient documentation

## 2017-04-13 DIAGNOSIS — I440 Atrioventricular block, first degree: Secondary | ICD-10-CM | POA: Diagnosis not present

## 2017-04-13 DIAGNOSIS — Z885 Allergy status to narcotic agent status: Secondary | ICD-10-CM | POA: Diagnosis not present

## 2017-04-13 DIAGNOSIS — I2722 Pulmonary hypertension due to left heart disease: Secondary | ICD-10-CM | POA: Diagnosis not present

## 2017-04-13 DIAGNOSIS — Z9012 Acquired absence of left breast and nipple: Secondary | ICD-10-CM | POA: Diagnosis not present

## 2017-04-13 DIAGNOSIS — E119 Type 2 diabetes mellitus without complications: Secondary | ICD-10-CM | POA: Diagnosis not present

## 2017-04-13 DIAGNOSIS — Z87891 Personal history of nicotine dependence: Secondary | ICD-10-CM | POA: Insufficient documentation

## 2017-04-13 DIAGNOSIS — E785 Hyperlipidemia, unspecified: Secondary | ICD-10-CM | POA: Diagnosis not present

## 2017-04-13 DIAGNOSIS — I6523 Occlusion and stenosis of bilateral carotid arteries: Secondary | ICD-10-CM | POA: Insufficient documentation

## 2017-04-13 DIAGNOSIS — Z888 Allergy status to other drugs, medicaments and biological substances status: Secondary | ICD-10-CM | POA: Insufficient documentation

## 2017-04-13 DIAGNOSIS — Z801 Family history of malignant neoplasm of trachea, bronchus and lung: Secondary | ICD-10-CM | POA: Insufficient documentation

## 2017-04-13 DIAGNOSIS — J449 Chronic obstructive pulmonary disease, unspecified: Secondary | ICD-10-CM | POA: Diagnosis not present

## 2017-04-13 DIAGNOSIS — R140 Abdominal distension (gaseous): Secondary | ICD-10-CM | POA: Diagnosis not present

## 2017-04-13 DIAGNOSIS — Z91040 Latex allergy status: Secondary | ICD-10-CM | POA: Insufficient documentation

## 2017-04-13 DIAGNOSIS — Z853 Personal history of malignant neoplasm of breast: Secondary | ICD-10-CM | POA: Diagnosis not present

## 2017-04-13 DIAGNOSIS — I088 Other rheumatic multiple valve diseases: Secondary | ICD-10-CM | POA: Diagnosis not present

## 2017-04-13 DIAGNOSIS — Z79899 Other long term (current) drug therapy: Secondary | ICD-10-CM | POA: Insufficient documentation

## 2017-04-13 DIAGNOSIS — R0609 Other forms of dyspnea: Secondary | ICD-10-CM | POA: Diagnosis not present

## 2017-04-13 DIAGNOSIS — R600 Localized edema: Secondary | ICD-10-CM | POA: Diagnosis not present

## 2017-04-13 DIAGNOSIS — Z9221 Personal history of antineoplastic chemotherapy: Secondary | ICD-10-CM | POA: Insufficient documentation

## 2017-04-13 DIAGNOSIS — Z880 Allergy status to penicillin: Secondary | ICD-10-CM | POA: Insufficient documentation

## 2017-04-13 DIAGNOSIS — I5032 Chronic diastolic (congestive) heart failure: Secondary | ICD-10-CM | POA: Diagnosis not present

## 2017-04-13 DIAGNOSIS — Z794 Long term (current) use of insulin: Secondary | ICD-10-CM | POA: Insufficient documentation

## 2017-04-13 DIAGNOSIS — Z90710 Acquired absence of both cervix and uterus: Secondary | ICD-10-CM | POA: Insufficient documentation

## 2017-04-13 DIAGNOSIS — Z808 Family history of malignant neoplasm of other organs or systems: Secondary | ICD-10-CM | POA: Insufficient documentation

## 2017-04-13 DIAGNOSIS — I2510 Atherosclerotic heart disease of native coronary artery without angina pectoris: Secondary | ICD-10-CM | POA: Insufficient documentation

## 2017-04-13 DIAGNOSIS — R0989 Other specified symptoms and signs involving the circulatory and respiratory systems: Secondary | ICD-10-CM | POA: Insufficient documentation

## 2017-04-13 DIAGNOSIS — Z8249 Family history of ischemic heart disease and other diseases of the circulatory system: Secondary | ICD-10-CM | POA: Insufficient documentation

## 2017-04-13 DIAGNOSIS — Z8051 Family history of malignant neoplasm of kidney: Secondary | ICD-10-CM | POA: Insufficient documentation

## 2017-04-13 DIAGNOSIS — I25119 Atherosclerotic heart disease of native coronary artery with unspecified angina pectoris: Secondary | ICD-10-CM | POA: Diagnosis present

## 2017-04-13 DIAGNOSIS — R0602 Shortness of breath: Secondary | ICD-10-CM | POA: Diagnosis not present

## 2017-04-13 HISTORY — PX: RIGHT/LEFT HEART CATH AND CORONARY ANGIOGRAPHY: CATH118266

## 2017-04-13 LAB — POCT I-STAT 3, VENOUS BLOOD GAS (G3P V)
Acid-Base Excess: 3 mmol/L — ABNORMAL HIGH (ref 0.0–2.0)
Acid-Base Excess: 3 mmol/L — ABNORMAL HIGH (ref 0.0–2.0)
Bicarbonate: 28.1 mmol/L — ABNORMAL HIGH (ref 20.0–28.0)
Bicarbonate: 28.4 mmol/L — ABNORMAL HIGH (ref 20.0–28.0)
O2 Saturation: 73 %
O2 Saturation: 74 %
TCO2: 29 mmol/L (ref 0–100)
TCO2: 30 mmol/L (ref 0–100)
pCO2, Ven: 42.6 mmHg — ABNORMAL LOW (ref 44.0–60.0)
pCO2, Ven: 42.9 mmHg — ABNORMAL LOW (ref 44.0–60.0)
pH, Ven: 7.427 (ref 7.250–7.430)
pH, Ven: 7.429 (ref 7.250–7.430)
pO2, Ven: 38 mmHg (ref 32.0–45.0)
pO2, Ven: 38 mmHg (ref 32.0–45.0)

## 2017-04-13 LAB — POCT I-STAT 3, ART BLOOD GAS (G3+)
Acid-Base Excess: 3 mmol/L — ABNORMAL HIGH (ref 0.0–2.0)
Bicarbonate: 26.1 mmol/L (ref 20.0–28.0)
O2 Saturation: 96 %
TCO2: 27 mmol/L (ref 0–100)
pCO2 arterial: 36 mmHg (ref 32.0–48.0)
pH, Arterial: 7.469 — ABNORMAL HIGH (ref 7.350–7.450)
pO2, Arterial: 77 mmHg — ABNORMAL LOW (ref 83.0–108.0)

## 2017-04-13 LAB — GLUCOSE, CAPILLARY
Glucose-Capillary: 90 mg/dL (ref 65–99)
Glucose-Capillary: 121 mg/dL — ABNORMAL HIGH (ref 65–99)

## 2017-04-13 LAB — POCT ACTIVATED CLOTTING TIME: Activated Clotting Time: 120 seconds

## 2017-04-13 SURGERY — RIGHT/LEFT HEART CATH AND CORONARY ANGIOGRAPHY
Anesthesia: LOCAL

## 2017-04-13 MED ORDER — MIDAZOLAM HCL 2 MG/2ML IJ SOLN
INTRAMUSCULAR | Status: DC | PRN
  Administered 2017-04-13: 1 mg via INTRAVENOUS

## 2017-04-13 MED ORDER — SODIUM CHLORIDE 0.9 % IV SOLN: 250.0000 mL | INTRAVENOUS | Status: DC | PRN

## 2017-04-13 MED ORDER — HEPARIN (PORCINE) IN NACL 2-0.9 UNIT/ML-% IJ SOLN
INTRAMUSCULAR | Status: AC | PRN
  Administered 2017-04-13: 1000 mL

## 2017-04-13 MED ORDER — SODIUM CHLORIDE 0.9 % IV SOLN
INTRAVENOUS | Status: AC
Start: 2017-04-13 — End: 2017-04-13

## 2017-04-13 MED ORDER — SODIUM CHLORIDE 0.9% FLUSH: 3.0000 mL | Freq: Two times a day (BID) | INTRAVENOUS | Status: DC

## 2017-04-13 MED ORDER — IOPAMIDOL (ISOVUE-370) INJECTION 76%
INTRAVENOUS | Status: DC | PRN
  Administered 2017-04-13: 75 mL via INTRA_ARTERIAL

## 2017-04-13 MED ORDER — LIDOCAINE HCL (PF) 1 % IJ SOLN
INTRAMUSCULAR | Status: DC | PRN
  Administered 2017-04-13: 15 mL

## 2017-04-13 MED ORDER — FENTANYL CITRATE (PF) 100 MCG/2ML IJ SOLN
INTRAMUSCULAR | Status: AC
  Filled 2017-04-13: qty 2

## 2017-04-13 MED ORDER — IOPAMIDOL (ISOVUE-370) INJECTION 76%
INTRAVENOUS | Status: AC
Start: 2017-04-13 — End: 2017-04-13
  Filled 2017-04-13: qty 100

## 2017-04-13 MED ORDER — SODIUM CHLORIDE 0.9 % WEIGHT BASED INFUSION
1.0000 mL/kg/h | INTRAVENOUS | Status: DC
  Administered 2017-04-13: 1 mL/kg/h via INTRAVENOUS

## 2017-04-13 MED ORDER — MIDAZOLAM HCL 2 MG/2ML IJ SOLN
INTRAMUSCULAR | Status: AC
  Filled 2017-04-13: qty 2

## 2017-04-13 MED ORDER — SODIUM CHLORIDE 0.9% FLUSH: 3.0000 mL | INTRAVENOUS | Status: DC | PRN

## 2017-04-13 MED ORDER — LIDOCAINE HCL 1 % IJ SOLN
INTRAMUSCULAR | Status: AC
  Filled 2017-04-13: qty 20

## 2017-04-13 MED ORDER — ASPIRIN 81 MG PO CHEW
CHEWABLE_TABLET | ORAL | Status: AC
  Administered 2017-04-13: 81 mg via ORAL
  Filled 2017-04-13: qty 1

## 2017-04-13 MED ORDER — HEPARIN (PORCINE) IN NACL 2-0.9 UNIT/ML-% IJ SOLN
INTRAMUSCULAR | Status: AC
  Filled 2017-04-13: qty 1000

## 2017-04-13 MED ORDER — ASPIRIN 81 MG PO CHEW
81.0000 mg | CHEWABLE_TABLET | ORAL | Status: AC
  Administered 2017-04-13: 81 mg via ORAL

## 2017-04-13 MED ORDER — FENTANYL CITRATE (PF) 100 MCG/2ML IJ SOLN
INTRAMUSCULAR | Status: DC | PRN
  Administered 2017-04-13: 25 ug via INTRAVENOUS

## 2017-04-13 MED ORDER — SODIUM CHLORIDE 0.9 % WEIGHT BASED INFUSION
3.0000 mL/kg/h | INTRAVENOUS | Status: DC
  Administered 2017-04-13: 3 mL/kg/h via INTRAVENOUS

## 2017-04-13 SURGICAL SUPPLY — 11 items
CATH INFINITI 5FR MPB2 (CATHETERS) ×1 IMPLANT
CATH SWAN GANZ 7F STRAIGHT (CATHETERS) ×1 IMPLANT
GUIDEWIRE 3MM J TIP .035 145 (WIRE) ×1 IMPLANT
KIT HEART LEFT (KITS) ×2 IMPLANT
KIT MICROINTRODUCER STIFF 5F (SHEATH) ×1 IMPLANT
KIT PV (KITS) ×2 IMPLANT
PACK CARDIAC CATHETERIZATION (CUSTOM PROCEDURE TRAY) ×2 IMPLANT
SHEATH PINNACLE 5F 10CM (SHEATH) ×1 IMPLANT
SHEATH PINNACLE 7F 10CM (SHEATH) ×1 IMPLANT
TRANSDUCER W/STOPCOCK (MISCELLANEOUS) ×2 IMPLANT
WIRE EMERALD 3MM-J .025X260CM (WIRE) ×1 IMPLANT

## 2017-04-13 NOTE — Progress Notes (Addendum)
Site area: RFA / RFV Site Prior to Removal:  Level 0 Pressure Applied For:25 min Manual:  yes  Patient Status During Pull: stable  Post Pull Site:  Level 0 Post Pull Instructions Given:  yes Post Pull Pulses Present: palpable Dressing Applied:  tegaderm Bedrest begins @ 5436 till 1505 Comments:

## 2017-04-13 NOTE — Interval H&P Note (Signed)
History and Physical Interval Note:  04/13/2017 9:24 AM  [name]  has presented today for surgery, with the diagnosis of sob   chf  The various methods of treatment have been discussed with the patient and family. After consideration of risks, benefits and other options for treatment, the patient has consented to  Procedure(s): RIGHT/LEFT HEART CATH AND CORONARY ANGIOGRAPHY (N/A) and possible angioplasty as a surgical intervention .  The patient's history has been reviewed, patient examined, no change in status, stable for surgery.  I have reviewed the patient's chart and labs.  Questions were answered to the patient's satisfaction.   Ischemic Symptoms? CCS III (Marked limitation of ordinary activity) Anti-ischemic Medical Therapy? Minimal Therapy (1 class of medications) Non-invasive Test Results? Intermediate-risk stress test findings: cardiac mortality 1-3%/year Prior CABG? No Previous CABG   Patient Information:   1-2V CAD, no prox LAD  U (6)  Indication: 16; Score: 6   Patient Information:   CTO of 1 vessel, no other CAD  U (6)  Indication: 26; Score: 6   Patient Information:   1V CAD with prox LAD  A (7)  Indication: 32; Score: 7   Patient Information:   2V-CAD with prox LAD  A (8)  Indication: 38; Score: 8   Patient Information:   3V-CAD without LMCA  A (8)  Indication: 44; Score: 8   Patient Information:   3V-CAD without LMCA With Abnormal LV systolic function  A (9)  Indication: 48; Score: 9   Patient Information:   LMCA-CAD  A (9)  Indication: 49; Score: 9   Patient Information:   2V-CAD with prox LAD PCI  A (7)  Indication: 62; Score: 7   Patient Information:   2V-CAD with prox LAD CABG  A (8)  Indication: 62; Score: 8   Patient Information:   3V-CAD without LMCA With Low CAD burden(i.e., 3 focal stenoses, low SYNTAX score) PCI  A (7)  Indication: 63; Score: 7   Patient Information:   3V-CAD without LMCA With  Low CAD burden(i.e., 3 focal stenoses, low SYNTAX score) CABG  A (9)  Indication: 63; Score: 9   Patient Information:   3V-CAD without LMCA E06c - Intermediate-high CAD burden (i.e., multiple diffuse lesions, presence of CTO, or high SYNTAX score) PCI  U (4)  Indication: 64; Score: 4   Patient Information:   3V-CAD without LMCA E06c - Intermediate-high CAD burden (i.e., multiple diffuse lesions, presence of CTO, or high SYNTAX score) CABG  A (9)  Indication: 64; Score: 9   Patient Information:   LMCA-CAD With Isolated LMCA stenosis  PCI  U (6)  Indication: 65; Score: 6   Patient Information:   LMCA-CAD Additional CAD, low CAD burden (i.e., 1- to 2-vessel additional involvement, low SYNTAX score) PCI  U (5)  Indication: 66; Score: 5   Patient Information:   LMCA-CAD Additional CAD, low CAD burden (i.e., 1- to 2-vessel additional involvement, low SYNTAX score) CABG  A (9)  Indication: 66; Score: 9   Patient Information:   LMCA-CAD With Isolated LMCA stenosis  CABG  A (9)  Indication: 66; Score: 9   Patient Information:   LMCA-CAD Additional CAD, intermediate-high CAD burden (i.e., 3-vessel involvement, presence of CTO, or high SYNTAX score) PCI  I (3)  Indication: 67; Score: 3   Patient Information:   LMCA-CAD Additional CAD, intermediate-high CAD burden (i.e., 3-vessel involvement, presence of CTO, or high SYNTAX score) CABG  A (9)  Indication: 67; Score: 9  Adrian Prows

## 2017-04-13 NOTE — Discharge Instructions (Signed)
Angiogram, Care After °This sheet gives you information about how to care for yourself after your procedure. Your health care provider may also give you more specific instructions. If you have problems or questions, contact your health care provider. °What can I expect after the procedure? °After the procedure, it is common to have bruising and tenderness at the catheter insertion area. °Follow these instructions at home: °Insertion site care  °· Follow instructions from your health care provider about how to take care of your insertion site. Make sure you: °¨ Wash your hands with soap and water before you change your bandage (dressing). If soap and water are not available, use hand sanitizer. °¨ Change your dressing as told by your health care provider. °¨ Leave stitches (sutures), skin glue, or adhesive strips in place. These skin closures may need to stay in place for 2 weeks or longer. If adhesive strip edges start to loosen and curl up, you may trim the loose edges. Do not remove adhesive strips completely unless your health care provider tells you to do that. °· Do not take baths, swim, or use a hot tub until your health care provider approves. °· You may shower 24-48 hours after the procedure or as told by your health care provider. °¨ Gently wash the site with plain soap and water. °¨ Pat the area dry with a clean towel. °¨ Do not rub the site. This may cause bleeding. °· Do not apply powder or lotion to the site. Keep the site clean and dry. °· Check your insertion site every day for signs of infection. Check for: °¨ Redness, swelling, or pain. °¨ Fluid or blood. °¨ Warmth. °¨ Pus or a bad smell. °Activity  °· Rest as told by your health care provider, usually for 1-2 days. °· Do not lift anything that is heavier than 10 lbs. (4.5 kg) or as told by your health care provider. °· Do not drive for 24 hours if you were given a medicine to help you relax (sedative). °· Do not drive or use heavy machinery while  taking prescription pain medicine. °General instructions  °· Return to your normal activities as told by your health care provider, usually in about a week. Ask your health care provider what activities are safe for you. °· If the catheter site starts bleeding, lie flat and put pressure on the site. If the bleeding does not stop, get help right away. This is a medical emergency. °· Drink enough fluid to keep your urine clear or pale yellow. This helps flush the contrast dye from your body. °· Take over-the-counter and prescription medicines only as told by your health care provider. °· Keep all follow-up visits as told by your health care provider. This is important. °Contact a health care provider if: °· You have a fever or chills. °· You have redness, swelling, or pain around your insertion site. °· You have fluid or blood coming from your insertion site. °· The insertion site feels warm to the touch. °· You have pus or a bad smell coming from your insertion site. °· You have bruising around the insertion site. °· You notice blood collecting in the tissue around the catheter site (hematoma). The hematoma may be painful to the touch. °Get help right away if: °· You have severe pain at the catheter insertion area. °· The catheter insertion area swells very fast. °· The catheter insertion area is bleeding, and the bleeding does not stop when you hold steady pressure on   the area.  The area near or just beyond the catheter insertion site becomes pale, cool, tingly, or numb. These symptoms may represent a serious problem that is an emergency. Do not wait to see if the symptoms will go away. Get medical help right away. Call your local emergency services (911 in the [place] Do not drive yourself to the hospital. Summary  After the procedure, it is common to have bruising and tenderness at the catheter insertion area.  After the procedure, it is important to rest and drink plenty of fluids.  Do not take baths,  swim, or use a hot tub until your health care provider says it is okay to do so. You may shower 24-48 hours after the procedure or as told by your health care provider.  If the catheter site starts bleeding, lie flat and put pressure on the site. If the bleeding does not stop, get help right away. This is a medical emergency. This information is not intended to replace advice given to you by your health care provider. Make sure you discuss any questions you have with your health care provider. Document Released: 05/21/2005 Document Revised: 10/07/2016 Document Reviewed: 10/07/2016 Elsevier Interactive Patient Education  2017 [employer].

## 2017-04-14 ENCOUNTER — Encounter (HOSPITAL_COMMUNITY): Payer: Self-pay | Admitting: Cardiology

## 2017-04-22 DIAGNOSIS — I2510 Atherosclerotic heart disease of native coronary artery without angina pectoris: Secondary | ICD-10-CM | POA: Diagnosis not present

## 2017-04-22 DIAGNOSIS — R9431 Abnormal electrocardiogram [ECG] [EKG]: Secondary | ICD-10-CM | POA: Diagnosis not present

## 2017-04-22 DIAGNOSIS — R0602 Shortness of breath: Secondary | ICD-10-CM | POA: Diagnosis not present

## 2017-04-22 DIAGNOSIS — I272 Pulmonary hypertension, unspecified: Secondary | ICD-10-CM | POA: Diagnosis not present

## 2017-04-25 DIAGNOSIS — I272 Pulmonary hypertension, unspecified: Secondary | ICD-10-CM | POA: Diagnosis present

## 2017-04-25 NOTE — H&P (Signed)
OFFICE VISIT NOTES COPIED TO EPIC FOR DOCUMENTATION  . History of Present Illness Michele Maine FNP-C; 04/25/2017 8:43 AM) Patient words: Last OV 03/22/2017; 7-10 day FU cath.  The patient is a 83 year old female who presents for a Follow-up for Shortness of breath. Patient with hypertension, hyperlipidemia, diabetes mellitus who was referred to me for evaluation of recent worsening dyspnea that started about 2 months ago. Patient has history of breast cancer, status post radical mastectomy and chemotherapy but no radiation therapy. She has decreased her physical activity as per daughter at the bedside. Minimal activities bring her dyspnea. No associated chest pain or chest tightness she takes furosemide on a daily basis for leg edema. No dizziness or syncope. No palpitations. No recent weight changes. She was told to have carotid artery stenosis, but not had any follow-up since 2016. Diabetes is well controlled. She has known carotid disease.  She underwent echocardiogram that showed severe pulmonary hypertension with RV strain, diastolic dysfunction, mild concentric hypertrophy left ventricle, dilation of both the left and right atrium, and moderate to severe tricuspid regurgitation. Lexiscan stress testing showed ST depression and T-wave inversion in the inferior and lateral leads. Given her continued dyspnea, she underwent right and left heart catheterization for further evaluation.  Patient symptoms of dyspnea is unrelated to coronary artery disease. Hence medical management for CAD. Aggressive diuresis was recommended as reduction in pulmonary Artery wedge would improve her dyspnea. She now presents for post cath follow up.  Patients daughter is present at bedside and reports her dyspnea has improved with diet modifications in avoiding salt. No new complaints today.   Problem List/Past Medical [name]; 04/22/2017 1:30 PM) Pulmonary hypertension, moderate to severe (I27.20)   Echocardiogram 03/10/2017: Echocardiogram 03/10/2017: Left ventricle cavity is small. Mild concentric hypertrophy of the left ventricle. Abnormal septal wall motion due to right ventricular volume overload. Doppler evidence of grade I (impaired) diastolic dysfunction, elevated LAP. Calculated EF 64%. Left atrial cavity is mildly dilated at 4.1 cm. Right atrial cavity is moderate to severely dilated. Right ventricle cavity is moderately dilated. Mildly reduced right ventricular function. Trace mitral regurgitation. Moderate to severe tricuspid regurgitation. Severe pulmonary hypertension. Pulmonary artery systolic pressure is estimated at 80 mm Hg. Mild to moderate pulmonic regurgitation. IVC is dilated with poor inspiration collapse consistent with elevated right atrial pressure. Bilateral carotid artery occlusion (P29.51)  Carotid artery duplex 03/10/2017: Bilateral internal carotid artery flow appears to be sluggish and may suggest complete occlusion. Left vertebral artery flow is undetectable (no flow). Antegrade right vertebral artery flow. Consider different modality of imaging to better define anatomy.  Carotid artery duplex 06/12/2013: Bilateral internal carotid artery occlusion. MRA confirms carotid disease with robust vertebral flow bilatral. COPD (chronic obstructive pulmonary disease) (J44.9)  Shortness of breath on exertion (R06.02)  Lexiscan myoview stress test 03/05/2017: 1. The resting electrocardiogram demonstrated normal sinus rhythm, normal resting conduction and ST depression with T inversion in the inferior and lateral leads. Stress EKG is nondiagnostic for ischemia due to pharmacologic stress testing with Lexiscan, however there was additional 1.5 mm ST depression in the same leads with pharmacologic stress. Status symptoms included dyspnea and dizziness. 2. Myocardial perfusion imaging is normal. Overall left ventricular systolic function was normal without regional wall  motion abnormalities. The left ventricular ejection fraction was 90%. This is a low risk study. Clinical correlation recommended in view of EKG abnomalities. Benign essential hypertension (I10)  Controlled type 2 diabetes mellitus without complication, with long-term current use of  insulin (E11.9)  Labwork  04/08/2017: Creatinine 0.72, sodium 1:30, potassium 3.6, BMP normal. WBC 3.2, normal H&H, MCV 98, MCH 33.6, CBC otherwise normal. INR 1.1, prothrombin time 11.2. 03/12/2017: Creatinine 0.73, potassium 4.0, BMP normal. BNP 625.6 Labs 02/09/2017: HB 13.3/HCT 38.3, normal indicis, platelets 197. Serum glucose 110 mg, BUN 14, creatinine 0.8, potassium 3.8, CMP normal. A1c 7.0%. Total cholesterol 156, triglycerides 70, HDL 75, LDL 67. 1st degree AV block (I44.0)  Abnormal EKG (R94.31)  Chronic diastolic heart failure (J47.82)  Bilateral carotid bruits (R09.89)   Allergies ([name]; 2017-05-10 1:30 PM) Penicillins  Aricept *PSYCHOTHERAPEUTIC AND NEUROLOGICAL AGENTS - MISC.*  Namenda *PSYCHOTHERAPEUTIC AND NEUROLOGICAL AGENTS - MISC.*  Donepezil HCl *PSYCHOTHERAPEUTIC AND NEUROLOGICAL AGENTS - MISC.*  Spironolactone *DIURETICS*  Dizziness. Codeine/Codeine Derivatives  Indocin *ANALGESICS - ANTI-INFLAMMATORY*  Latex   Family History ([name]; 05-10-2017 1:30 PM) Mother  Deceased. at age 64, CHF Father  Deceased. at age 77 from cancer (lung and brain) Sister 1  Deceased. at age 35, kidney cancer, no heart issues Brother 1  Deceased. at age 85, cirrohsis of liver, no heart issues  Social History ([name]; 2017/05/10 1:40 PM) Current tobacco use  Former smoker. quit in 2014 Non Drinker/No Alcohol Use  Marital status  Divorced. Living Situation  Lives with relatives. daughter and son Number of Children  4.  Past Surgical History [name]; 05-10-2017 1:30 PM) Cholecystectomy [2012]: Appendectomy [2014]: Mastectomy; Subtotal - Left  [1984]: Hysterectomy; Total [1980]:  Medication History ([name]; 2017/05/10 1:47 PM) Probiotic Advanced (1 Oral daily) Specific strength unknown - Active. Ursodiol (500MG Tablet, 1 Oral two times daily) Active. Armour Thyroid (15MG Tablet, 1 Oral daily) Active. Systane (0.4-0.3% Solution, Ophthalmic as needed) Active. Metamucil Smooth Texture (28% Packet, 1 Oral daily) Active. Pantoprazole Sodium (40MG Tablet DR, 1 Oral daily) Active. NovoLOG FlexPen (100UNIT/ML Soln Pen-inj, 5 units Subcutaneous three times daily as needed) Active. Multivital (1 Oral daily) Active. Mirtazapine (30MG Tablet, 1 Oral at bedtime) Active. Metoprolol Tartrate (50MG Tablet, 12m in am Oral 282mat bedtime) Active. Valsartan-Hydrochlorothiazide (160-25MG Tablet, 1 (one) Tablet Tablet Oral every morning, Taken starting 02/25/2017) Active. (Discontinue Losartan) Loratadine (10MG (Rapid) Tablet, 1 Oral daily) Active. Lantus (100UNIT/ML Solution, 8 units Subcutaneous at bedtime) Active. Incruse Ellipta (62.5MCG/INH Aero Pow Br Act, 1 puff Inhalation daily) Active. HydrALAZINE HCl (25MG Tablet, 1 Oral two times daily) Active. Furosemide (40MG Tablet, 1/2 Oral daily, Taken starting 02/25/2017) Active. (Change to PRN. Add Valsartan HCT) Glucerna Advance Shake (Oral prn) Active. Docusate Sodium (100MG Capsule, 1 Oral two times daily) Active. Clotrimazole-Betamethasone (1-0.05% Cream, 2 times daily External as needed for eczema) Active. Clopidogrel Bisulfate (75MG Tablet, 1 Oral daily) Active. Citalopram Hydrobromide (10MG Tablet, 1 Oral daily) Active. Vitamin D (Cholecalciferol) (1000UNIT Tablet, 1 Oral daily) Active. Cerefolin NAC (6-90.314-2-600MG Tablet, 1 Oral daily) Active. Atorvastatin Calcium (10MG Tablet, 1 Oral daily) Active. ALPRAZolam (0.25MG Tablet, 1 Oral daily) Active. Medications Reconciled (verbally with list)  Diagnostic Studies History (JAnderson Maltaergeant; 6/June 25, 20189:00 AM) Coronary Angiogram [03/24/2017]:    Review of Systems (AGwinda MaineNP-C; 04/2017-06-25:43 PM) General Present- Tiredness. Not Present- Appetite Loss and Weight Gain. Respiratory Present- Decreased Exercise Tolerance and Difficulty Breathing on Exertion (improved). Not Present- Chronic Cough, Sputum Production, Wakes up from Sleep Wheezing or Short of Breath and Wheezing. Gastrointestinal Not Present- Black, Tarry Stool and Difficulty Swallowing. Musculoskeletal Not Present- Decreased Range of Motion and Muscle Atrophy. Neurological Not Present- Attention Deficit. Psychiatric Not Present- Personality Changes and Suicidal Ideation.  Endocrine Not Present- Cold Intolerance and Heat Intolerance. Hematology Not Present- Abnormal Bleeding. All other systems negative  Vitals ([name]; 04/22/2017 1:47 PM) 04/22/2017 1:36 PM Weight: 148.56 lb Height: 64in Body Surface Area: 1.72 m Body Mass Index: 25.5 kg/m  Pulse: 58 (Regular)  P.OX: 93% (Room air) BP: 122/60 (Sitting, Right Arm, Standard)       Physical Exam Michele Maine FNP-C; 04/22/2017 2:36 PM) General Mental Status-Alert. General Appearance-Cooperative and Appears stated age. Build & Nutrition-Well nourished and Moderately built.  Head and Neck Thyroid Gland Characteristics - normal size and consistency and no palpable nodules.  Chest and Lung Exam Chest and lung exam reveals -quiet, even and easy respiratory effort with no use of accessory muscles, non-tender and on auscultation, normal breath sounds, no adventitious sounds.  Cardiovascular Cardiovascular examination reveals -normal heart sounds, regular rate and rhythm with no murmurs. Inspection Jugular vein - Right - Distended. Palpation/Percussion Apical Impulse - Location - 5th Left Intercostal Space Lateral to Midclavicular Line. Amplitude - Markedly increased.  Abdomen Inspection Contour - Note:  ascites. Palpation/Percussion Normal exam - Non Tender and No hepatosplenomegaly.  Peripheral Vascular Lower Extremity Inspection - Bilateral - Inspection Normal. Palpation - Edema - Bilateral - No edema. Femoral pulse - Bilateral - Normal. Popliteal pulse - Bilateral - Normal. Dorsalis pedis pulse - Bilateral - 1+. Posterior tibial pulse - Bilateral - Feeble. Carotid arteries - Bilateral-Harsh Bruit(Left is louder.).  Neurologic Neurologic evaluation reveals -alert and oriented x 3 with no impairment of recent or remote memory. Motor-Grossly intact without any focal deficits.  Musculoskeletal Global Assessment Left Lower Extremity - no deformities, masses or tenderness, no known fractures. Right Lower Extremity - no deformities, masses or tenderness, no known fractures.    Assessment & Plan Michele Maine FNP-C; 04/25/2017 8:54 AM) Pulmonary hypertension, moderate to severe (I27.20) Story: Echocardiogram 03/10/2017: Left ventricle cavity is small. Mild concentric hypertrophy of the left ventricle. Abnormal septal wall motion due to right ventricular volume overload. Doppler evidence of grade I (impaired) diastolic dysfunction, elevated LAP. Calculated EF 64%. Left atrial cavity is mildly dilated at 4.1 cm. Right atrial cavity is moderate to severely dilated. Right ventricle cavity is moderately dilated. Mildly reduced right ventricular function. Trace mitral regurgitation. Moderate to severe tricuspid regurgitation. Severe pulmonary hypertension. Pulmonary artery systolic pressure is estimated at 80 mm Hg. Mild to moderate pulmonic regurgitation. IVC is dilated with poor inspiration collapse consistent with elevated right atrial pressure. Impression: Right heart Cath: RA 16/15, mean 14 mmHg, RA saturation 73%. RV 104/11, EDP 15 mmHg. PA 109/30, mean 59 mmHg. PA saturation 74%. PW 34/39, mean 34 mmHg. LV 180/10, EDP 20 mmHg. Aorta 177/73, mean 140 mmHg. Aortic saturation 96%.  Fick cardiac output 5.25, index 3.03. Findings consistent with secondary pulmonary hypertension secondary to elevated LVEDP. Shortness of breath on exertion (R06.02) Story: Lexiscan myoview stress test 03/05/2017: 1. The resting electrocardiogram demonstrated normal sinus rhythm, normal resting conduction and ST depression with T inversion in the inferior and lateral leads. Stress EKG is nondiagnostic for ischemia due to pharmacologic stress testing with Lexiscan, however there was additional 1.5 mm ST depression in the same leads with pharmacologic stress. Status symptoms included dyspnea and dizziness. 2. Myocardial perfusion imaging is normal. Overall left ventricular systolic function was normal without regional wall motion abnormalities. The left ventricular ejection fraction was 90%. This is a low risk study. Clinical correlation recommended in view of EKG abnomalities. Current Plans Started Furosemide 40MG, 1 (one) Tablet as directed, #60,  30 days starting 04/22/2017, Ref. x1. Local Order: take in AM and 3 PM, increased dose Started Potassium Chloride Crys ER 10MEQ, 1 (one) Tablet two times daily, #60, 30 days starting 04/23/2017, No Refill. Local Order: take with Lasix ASSAY, NATIURETIC PEPTIDE (70962) ASSAY, NATIURETIC PEPTIDE ([id number]) Atherosclerosis of native coronary artery of native heart without angina pectoris (I25.10) Story: 04/13/2017 Left heart catheterization: Left main normal, LAD severely tortuous. Mild luminal irregularity. Mild calcification. Mid to distal segment has 50-60% stenosis. Circumflex proximal to mid calcified, 40% stenosis. PL branch bifurcation occurs proximal, ostial 80% stenosis. Normal LV systolic function, EF 94-76%. Mildly calcified aortic root. Current Plans METABOLIC PANEL, BASIC (54650) CBC & PLATELETS (AUTO) (35465) PT (PROTHROMBIN TIME) (68127) Abnormal EKG (R94.31) Impression: EKG 02/25/2017: Sinus rhythm with marked first-degree AV block at the rate of  73 bpm, biatrial enlargement, ST depression in the inferior and anterolateral 8, cannot exclude ischemia. Chronic diastolic heart failure (N17.00) Benign essential hypertension (I10) Current Plans Discontinued Valsartan-Hydrochlorothiazide 160-25MG. Local Order: Discontinue Losartan Started Valsartan 80MG, 1 (one) Tablet daily, #30, 30 days starting 04/22/2017, Ref. x1. Controlled type 2 diabetes mellitus without complication, with long-term current use of insulin (E11.9) Bilateral carotid artery occlusion (F74.94) Story: Carotid artery duplex 03/10/2017: Bilateral internal carotid artery flow appears to be sluggish and may suggest complete occlusion. Left vertebral artery flow is undetectable (no flow). Antegrade right vertebral artery flow. Consider different modality of imaging to better define anatomy.  Carotid artery duplex 06/12/2013: Bilateral internal carotid artery occlusion. MRA confirms carotid disease with robust vertebral flow bilatral. Cryptogenic cirrhosis of liver (K74.69) Labwork Story: Labs 04/22/2017: BNP 339, improved from 625.6 on 03/12/2017.  04/08/2017: Creatinine 0.72, sodium 1:30, potassium 3.6, BMP normal. WBC 3.2, normal H&H, MCV 98, MCH 33.6, CBC otherwise normal. INR 1.1, prothrombin time 11.2.  03/12/2017: Creatinine 0.73, potassium 4.0, BMP normal. BNP 625.6  Labs 02/09/2017: HB 13.3/HCT 38.3, normal indicis, platelets 197. Serum glucose 110 mg, BUN 14, creatinine 0.8, potassium 3.8, CMP normal. A1c 7.0%. Total cholesterol 156, triglycerides 70, HDL 75, LDL 67. Current Plans Mechanism of underlying disease process and action of medications discussed with the patient. I discussed primary/secondary prevention and also dietary counceling was done. Patient presents for 7-10 days post cath follow-up. Left heart catheterization showed diffuse disease in coronary arteries, is recommended continued medical therapy. She was noted to have elevated LVEDPthat appear to be  out of proportion to her pulmonary hypertension. It was recommended to consider aggressive diuresis and repeating right heart catheterization for evaluation of primary pulmonary hypertension. Patient's daughter was educated on the importance of avoiding salt and fluid restriction at discharge. Patient and her daughter report making significant changes and she reports dyspnea has improved significantly. She continues to have evidence of right heart failure on physical exam with JVD. She also has a history of ascites from cirrhosis of the liver. She is followed by GI. We will increase diuretics and repeat right heart catheterization for further evaluation of primary pulmonary hypertension. I'll increase Lasix to 40 mg twice a day. Potassium supplements were added. Will switch valsartan hydrochlorothiazide to plain valsartan. Labs obtained today showed BNP has improved to 339 from 625.6 in Michele. I have again stressed the importance of avoiding salt and placed on 1639m fluid restirctions.  In regard to bilateral carotid artery stenosis, she has known carotid artery occlusion on CT andMRI. She remains asymptomatic we'll continue surveillance. We'll have her follow-up after her procedure for further recommendations and evaluation.  *I have discussed this  case with Dr. Einar Gip and he personally examined the patient and participated in formulating the plan.*  CC Dr. [name].  Signed electronically by Michele Maine, FNP-C (04/25/2017 8:55 AM)

## 2017-04-26 DIAGNOSIS — R0602 Shortness of breath: Secondary | ICD-10-CM | POA: Diagnosis not present

## 2017-04-26 DIAGNOSIS — I2510 Atherosclerotic heart disease of native coronary artery without angina pectoris: Secondary | ICD-10-CM | POA: Diagnosis not present

## 2017-04-28 ENCOUNTER — Encounter: Payer: Self-pay | Admitting: Gastroenterology

## 2017-04-28 ENCOUNTER — Ambulatory Visit (INDEPENDENT_AMBULATORY_CARE_PROVIDER_SITE_OTHER): Payer: Medicare Other | Admitting: Gastroenterology

## 2017-04-28 VITALS — BP 108/53 | HR 60 | Temp 99.2°F | Ht 62.0 in | Wt 145.4 lb

## 2017-04-28 DIAGNOSIS — K279 Peptic ulcer, site unspecified, unspecified as acute or chronic, without hemorrhage or perforation: Secondary | ICD-10-CM

## 2017-04-28 DIAGNOSIS — K743 Primary biliary cirrhosis: Secondary | ICD-10-CM

## 2017-04-28 DIAGNOSIS — K7460 Unspecified cirrhosis of liver: Secondary | ICD-10-CM | POA: Diagnosis not present

## 2017-04-28 NOTE — Assessment & Plan Note (Signed)
Multiple gastric ulcers seen last year, resolved on recent EGD but with gastritis. Continue current PPI regimen. Continue NSAID avoidance. Return to the office in 6 months or sooner if needed.

## 2017-04-28 NOTE — Patient Instructions (Signed)
1. Pleas have your labs done at your convenience in the next few weeks. 2. Contact us when you're ready to schedule your ultrasound of the liver. If we have not heard from you in the next 4 weeks we will contact you and see if your ready. 3. Return to the office in 6 months or sooner if needed.

## 2017-04-28 NOTE — Progress Notes (Signed)
Primary Care Physician: [name], MD  Primary Gastroenterologist:  Barney Drain, MD   Chief Complaint  Patient presents with  . Peptic Ulcer Disease    HPI: [name] is a 83 y.o. female here for follow-up of EGD done back in February. Initial EGD back in October 2017 done for dysphagia. She was found to have an esophageal web status post dilation. Incidentally noted to have 5 gastric ulcers likely due to NSAIDs (naproxen). Surveillance EGD in February showed documented healing of the ulcers, she had mild gastritis. She has been on Protonix twice a day.Patient also has a history of well compensated cirrhosis and primary biliary cholangitis. She is due for right upper quadrant ultrasound and labs for hepatoma surveillance/cirrhosis care.Her weight is down 10 pounds since February of this year.  Clinically she has been doing well from a GI standpoint. Her appetite is been good. No issue swallowing since her endoscopy. No vomiting. Bowel function regular on fiber, stool softener, probiotics. No melena rectal bleeding. Denies abdominal pain.  Current Outpatient Prescriptions  Medication Sig Dispense Refill  . acetaminophen (TYLENOL) 500 MG tablet Take 500 mg by mouth every 8 (eight) hours as needed for mild pain or moderate pain.    Marland Kitchen ALPRAZolam (XANAX) 0.25 MG tablet Take 0.25 mg by mouth 2 (two) times daily as needed for anxiety.    Marland Kitchen atorvastatin (LIPITOR) 10 MG tablet Take 1 tablet (10 mg total) by mouth daily. 30 tablet 0  . cholecalciferol (VITAMIN D) 1000 UNITS tablet Take 1,000 Units by mouth every morning.     . citalopram (CELEXA) 10 MG tablet Take 1 tablet (10 mg total) by mouth daily. (Patient taking differently: Take 20 mg by mouth daily. )    . clopidogrel (PLAVIX) 75 MG tablet Take 75 mg by mouth daily.    . clotrimazole-betamethasone (LOTRISONE) cream Apply 1 application topically 2 (two) times daily as needed (Eczema).     . docusate sodium (COLACE) 100 MG capsule  Take 100 mg by mouth 2 (two) times daily.    . feeding supplement, GLUCERNA SHAKE, (GLUCERNA SHAKE) LIQD Take 237 mLs by mouth daily as needed.     . furosemide (LASIX) 40 MG tablet Take 1.5 tablets (60 mg total) by mouth daily. (Patient taking differently: Take 40 mg by mouth 2 (two) times daily. ) 135 tablet 3  . Glycerin-Polysorbate 80 (REFRESH DRY EYE THERAPY OP) Place 1 drop into both eyes 2 (two) times daily as needed.    . hydrALAZINE (APRESOLINE) 25 MG tablet Take 1 tablet (25 mg total) by mouth 2 (two) times daily. 60 tablet 5  . insulin aspart (NOVOLOG FLEXPEN) 100 UNIT/ML FlexPen Inject 5 Units into the skin 3 (three) times daily with meals. When BG is over 100    . Insulin Glargine (LANTUS SOLOSTAR) 100 UNIT/ML Solostar Pen Inject 8 Units into the skin at bedtime.    . lidocaine (XYLOCAINE) 5 % ointment APPLY 2-3 GRAMS (1 GRAM=1 INCH) TO AFFECTED AREA 4 TIMES DAILY/ As needed  5  . loratadine (CLARITIN) 10 MG tablet Take 10 mg by mouth daily.    . Methylfol-Algae-B12-Acetylcyst (CEREFOLIN NAC) 6-90.314-2-600 MG TABS Take 1 tablet by mouth daily.     . metoprolol (LOPRESSOR) 50 MG tablet Take 42m in the a.m.and 282min the pm    . mirtazapine (REMERON) 30 MG tablet Take 30 mg by mouth at bedtime.    . Multiple Vitamin (MULTIVITAMIN) tablet Take 1 tablet by  mouth daily. Reported on 02/06/2016    . pantoprazole (PROTONIX) 40 MG tablet Take 1 tablet (40 mg total) by mouth 2 (two) times daily before a meal. 180 tablet 1  . potassium chloride (K-DUR,KLOR-CON) 10 MEQ tablet Take 10 mEq by mouth 2 (two) times daily.    . Probiotic Product (PROBIOTIC DAILY PO) Take 1 capsule by mouth daily.    . psyllium (METAMUCIL SMOOTH TEXTURE) 28 % packet Take 1 packet by mouth 2 (two) times daily as needed.     . thyroid (ARMOUR) 15 MG tablet Take 15 mg by mouth daily.    Marland Kitchen umeclidinium bromide (INCRUSE ELLIPTA) 62.5 MCG/INH AEPB Inhale 1 puff into the lungs daily.    . ursodiol (ACTIGALL) 500 MG tablet  TAKE 1 TABLET TWICE A DAY WITH MEALS 180 tablet 3  . valsartan (DIOVAN) 80 MG tablet Take 80 mg by mouth daily.     No current facility-administered medications for this visit.     Allergies as of 04/28/2017 - Review Complete 04/28/2017  Allergen Reaction Noted  . Aricept [donepezil hcl]  02/16/2014  . Namenda [memantine hcl]  02/16/2014  . Donepezil Nausea Only 01/20/2016  . Spironolactone Other (See Comments) 12/16/2015  . Codeine Rash 02/16/2014  . Indocin [indomethacin] Rash 09/11/2014  . Latex Rash 02/16/2014  . Penicillins Rash 01/11/2007   Past Medical History:  Diagnosis Date  . Acute delirium 06/23/2013  . Acute diastolic congestive heart failure ([place]) 03/21/2014  . Anxiety   . Carotid artery disease ([facility])    Bilateral ICA occlusion  . Chronic headaches   . Chronic liver disease    ?cirrhosis on 10/2014 CT. H/O elevated alkphos and +AMA on URSO.  Marland Kitchen Coronary atherosclerosis of native coronary artery    Mild nonobstructive disease at cardiac catheterization May 2011  . Depression   . Essential hypertension, benign   . Fracture of femoral neck, right, closed ([place]) 03/28/2014  . History of breast cancer   . History of GI bleed   . Hyperlipidemia   . Orthostatic hypotension    Diabetic polyneuropathy/diabetic dysautonomia   . Osteopenia   . Parotid tumor   . Pneumonia 11/2016  . Pressure ulcer, heel, right, unstageable ([place]) 04/17/2014  . PSVT (paroxysmal supraventricular tachycardia) ([place])   . Syncope and collapse 04/15/2010   Qualifier: Diagnosis of  By: Angelena Form, MD, Harrell Gave    . Type 2 diabetes mellitus ([place])   . UPPER GASTROINTESTINAL HEMORRHAGE 03/21/2007   Qualifier: Diagnosis of  By: Jonna Munro MD, Roderic Scarce     Past Surgical History:  Procedure Laterality Date  . ABDOMINAL HYSTERECTOMY    . APPENDECTOMY    . CHOLECYSTECTOMY  2008  . COLONOSCOPY  2010   Dr. Oneida Alar: single tubular adenoma, one hyperplastic polyp. Next TCS 2020 health permitting.  .  ESOPHAGOGASTRODUODENOSCOPY N/A 08/17/2016   Procedure: ESOPHAGOGASTRODUODENOSCOPY (EGD);  Surgeon: Danie Binder, MD;  [facility];  Service: Endoscopy;  Laterality: N/A;  8:45 am  . ESOPHAGOGASTRODUODENOSCOPY N/A 01/01/2017   Procedure: ESOPHAGOGASTRODUODENOSCOPY (EGD);  Surgeon: Danie Binder, MD;  Location: [facility];  Service: Endoscopy;  Laterality: N/A;  9:15 am  . HEMORRHOID SURGERY  1960s  . HIP ARTHROPLASTY Right 03/28/2014   Procedure: ARTHROPLASTY BIPOLAR HIP;  Surgeon: Carole Civil, MD;  Location: [facility];  Service: Orthopedics;  Laterality: Right;  . LAPAROSCOPIC APPENDECTOMY N/A 04/13/2013   Procedure: APPENDECTOMY LAPAROSCOPIC;  Surgeon: Donato Heinz, MD;  Location: [facility];  Service: General;  Laterality: N/A;  .  MASTECTOMY  1994   Left breast -related to breast cancer  . RIGHT/LEFT HEART CATH AND CORONARY ANGIOGRAPHY N/A 04/13/2017   Procedure: RIGHT/LEFT HEART CATH AND CORONARY ANGIOGRAPHY;  Surgeon: Adrian Prows, MD;  Location: [facility];  Service: Cardiovascular;  Laterality: N/A;  . SAVORY DILATION N/A 08/17/2016   Procedure: SAVORY DILATION;  Surgeon: Danie Binder, MD;  [facility]: [facility];  Service: Endoscopy;  Laterality: N/A;  . VESICOVAGINAL FISTULA CLOSURE W/ TAH  1979    ROS:  General: Negative for anorexia, weight loss, fever, chills, fatigue, weakness. ENT: Negative for hoarseness, difficulty swallowing , nasal congestion. CV: Negative for chest pain, angina, palpitations, dyspnea on exertion, peripheral edema.  Respiratory: Negative for dyspnea at rest, dyspnea on exertion, cough, sputum, wheezing.  GI: See history of present illness. GU:  Negative for dysuria, hematuria, urinary incontinence, urinary frequency, nocturnal urination.  Endo: Negative for unusual weight change.    Physical Examination:   BP (!) 108/53   Pulse 60   Temp 99.2 F (37.3 C) (Oral)   Ht _0  (1.575 m)   Wt 145 lb 6.4 oz (66 kg)   BMI 26.59  kg/m   General: Well-nourished, well-developed in no acute distress.  Eyes: No icterus. Mouth: Oropharyngeal mucosa moist and pink , no lesions erythema or exudate. Lungs: Clear to auscultation bilaterally.  Heart: Regular rate and rhythm, no murmurs rubs or gallops.  Abdomen: Bowel sounds are normal, nontender, nondistended, no hepatosplenomegaly or masses, no abdominal bruits or hernia , no rebound or guarding.   Extremities: No lower extremity edema. No clubbing or deformities. Neuro: Alert and oriented x 4   Skin: Warm and dry, no jaundice.   Psych: Alert and cooperative, normal mood and affect.  Labs:  Lab Results  Component Value Date   CREATININE 0.95 (H) 07/30/2016   BUN 19 07/30/2016   NA 137 07/30/2016   K 3.7 07/30/2016   CL 98 07/30/2016   CO2 32 (H) 07/30/2016   Lab Results  Component Value Date   ALT 13 07/30/2016   AST 23 07/30/2016   ALKPHOS 109 07/30/2016   BILITOT 0.9 07/30/2016   Lab Results  Component Value Date   WBC 4.7 07/30/2016   HGB 14.7 07/30/2016   HCT 44.0 07/30/2016   MCV 98.9 07/30/2016   PLT 253 07/30/2016   Lab Results  Component Value Date   INR 1.1 07/30/2016   INR 1.04 07/25/2015   INR 1.26 03/28/2014    Imaging Studies: No results found.

## 2017-04-28 NOTE — Assessment & Plan Note (Signed)
Labs significant: Future. Continue Actigall 500 mg twice a day with meals. Return to the office in 6 months.

## 2017-04-28 NOTE — Assessment & Plan Note (Signed)
Well compensated disease. She is overdue for labs, AFP, right upper quadrant ultrasound for hepatoma screening. She is in agreement to obtaining labs in the next 1-2 weeks but would like to postpone her ultrasound until her cardiac issues have been resolved. She has a cardiac catheterization scheduled tomorrow. We will contact her in 1 month if we have not heard from her before then. Otherwise see her back in the office in 6 months for follow-up of cirrhosis.

## 2017-04-29 ENCOUNTER — Telehealth: Payer: Self-pay | Admitting: Gastroenterology

## 2017-04-29 ENCOUNTER — Encounter (HOSPITAL_COMMUNITY): Payer: Self-pay | Admitting: Cardiology

## 2017-04-29 ENCOUNTER — Encounter (HOSPITAL_COMMUNITY)
Admission: RE | Disposition: A | Discharge status: Home / Self Care | Payer: Self-pay | Source: Ambulatory Visit | Attending: Cardiology

## 2017-04-29 ENCOUNTER — Ambulatory Visit (HOSPITAL_COMMUNITY)
Admission: RE | Admit: 2017-04-29 | Discharge: 2017-04-29 | Disposition: A | Discharge status: Home / Self Care | Payer: Medicare Other | Source: Ambulatory Visit | Attending: Cardiology | Admitting: Cardiology

## 2017-04-29 DIAGNOSIS — K7469 Other cirrhosis of liver: Secondary | ICD-10-CM | POA: Diagnosis not present

## 2017-04-29 DIAGNOSIS — I110 Hypertensive heart disease with heart failure: Secondary | ICD-10-CM | POA: Diagnosis not present

## 2017-04-29 DIAGNOSIS — E119 Type 2 diabetes mellitus without complications: Secondary | ICD-10-CM | POA: Insufficient documentation

## 2017-04-29 DIAGNOSIS — I5032 Chronic diastolic (congestive) heart failure: Secondary | ICD-10-CM | POA: Diagnosis not present

## 2017-04-29 DIAGNOSIS — Z801 Family history of malignant neoplasm of trachea, bronchus and lung: Secondary | ICD-10-CM | POA: Insufficient documentation

## 2017-04-29 DIAGNOSIS — J449 Chronic obstructive pulmonary disease, unspecified: Secondary | ICD-10-CM | POA: Diagnosis not present

## 2017-04-29 DIAGNOSIS — I6523 Occlusion and stenosis of bilateral carotid arteries: Secondary | ICD-10-CM | POA: Diagnosis not present

## 2017-04-29 DIAGNOSIS — Z9221 Personal history of antineoplastic chemotherapy: Secondary | ICD-10-CM | POA: Diagnosis not present

## 2017-04-29 DIAGNOSIS — Z853 Personal history of malignant neoplasm of breast: Secondary | ICD-10-CM | POA: Insufficient documentation

## 2017-04-29 DIAGNOSIS — Z79899 Other long term (current) drug therapy: Secondary | ICD-10-CM | POA: Insufficient documentation

## 2017-04-29 DIAGNOSIS — R0602 Shortness of breath: Secondary | ICD-10-CM | POA: Diagnosis not present

## 2017-04-29 DIAGNOSIS — I2510 Atherosclerotic heart disease of native coronary artery without angina pectoris: Secondary | ICD-10-CM | POA: Insufficient documentation

## 2017-04-29 DIAGNOSIS — Z808 Family history of malignant neoplasm of other organs or systems: Secondary | ICD-10-CM | POA: Diagnosis not present

## 2017-04-29 DIAGNOSIS — I270 Primary pulmonary hypertension: Secondary | ICD-10-CM | POA: Diagnosis not present

## 2017-04-29 DIAGNOSIS — Z9012 Acquired absence of left breast and nipple: Secondary | ICD-10-CM | POA: Diagnosis not present

## 2017-04-29 DIAGNOSIS — I440 Atrioventricular block, first degree: Secondary | ICD-10-CM | POA: Insufficient documentation

## 2017-04-29 DIAGNOSIS — E785 Hyperlipidemia, unspecified: Secondary | ICD-10-CM | POA: Insufficient documentation

## 2017-04-29 DIAGNOSIS — I2729 Other secondary pulmonary hypertension: Secondary | ICD-10-CM | POA: Insufficient documentation

## 2017-04-29 DIAGNOSIS — Z880 Allergy status to penicillin: Secondary | ICD-10-CM | POA: Diagnosis not present

## 2017-04-29 DIAGNOSIS — Z8051 Family history of malignant neoplasm of kidney: Secondary | ICD-10-CM | POA: Insufficient documentation

## 2017-04-29 DIAGNOSIS — I272 Pulmonary hypertension, unspecified: Secondary | ICD-10-CM | POA: Diagnosis present

## 2017-04-29 DIAGNOSIS — I5082 Biventricular heart failure: Secondary | ICD-10-CM | POA: Insufficient documentation

## 2017-04-29 DIAGNOSIS — Z888 Allergy status to other drugs, medicaments and biological substances status: Secondary | ICD-10-CM | POA: Insufficient documentation

## 2017-04-29 DIAGNOSIS — Z91040 Latex allergy status: Secondary | ICD-10-CM | POA: Diagnosis not present

## 2017-04-29 DIAGNOSIS — Z794 Long term (current) use of insulin: Secondary | ICD-10-CM | POA: Insufficient documentation

## 2017-04-29 DIAGNOSIS — Z8249 Family history of ischemic heart disease and other diseases of the circulatory system: Secondary | ICD-10-CM | POA: Insufficient documentation

## 2017-04-29 DIAGNOSIS — I371 Nonrheumatic pulmonary valve insufficiency: Secondary | ICD-10-CM | POA: Insufficient documentation

## 2017-04-29 DIAGNOSIS — Z885 Allergy status to narcotic agent status: Secondary | ICD-10-CM | POA: Diagnosis not present

## 2017-04-29 HISTORY — PX: RIGHT HEART CATH: CATH118263

## 2017-04-29 LAB — GLUCOSE, CAPILLARY: Glucose-Capillary: 103 mg/dL — ABNORMAL HIGH (ref 65–99)

## 2017-04-29 LAB — POCT I-STAT 3, VENOUS BLOOD GAS (G3P V)
Acid-Base Excess: 5 mmol/L — ABNORMAL HIGH (ref 0.0–2.0)
Bicarbonate: 30 mmol/L — ABNORMAL HIGH (ref 20.0–28.0)
O2 Saturation: 73 %
TCO2: 31 mmol/L (ref 0–100)
pCO2, Ven: 46.1 mmHg (ref 44.0–60.0)
pH, Ven: 7.421 (ref 7.250–7.430)
pO2, Ven: 38 mmHg (ref 32.0–45.0)

## 2017-04-29 SURGERY — RIGHT HEART CATH

## 2017-04-29 MED ORDER — HEPARIN (PORCINE) IN NACL 2-0.9 UNIT/ML-% IJ SOLN
INTRAMUSCULAR | Status: AC | PRN
  Administered 2017-04-29: 500 mL

## 2017-04-29 MED ORDER — LIDOCAINE HCL (PF) 1 % IJ SOLN
INTRAMUSCULAR | Status: DC | PRN
Start: 2017-04-29 — End: 2017-04-29
  Administered 2017-04-29: 5 mL

## 2017-04-29 MED ORDER — SODIUM CHLORIDE 0.9% FLUSH: 3.0000 mL | Freq: Two times a day (BID) | INTRAVENOUS | Status: DC

## 2017-04-29 MED ORDER — SODIUM CHLORIDE 0.9 % IV SOLN: 250.0000 mL | INTRAVENOUS | Status: DC | PRN

## 2017-04-29 MED ORDER — SODIUM CHLORIDE 0.9% FLUSH: 3.0000 mL | INTRAVENOUS | Status: DC | PRN

## 2017-04-29 MED ORDER — FENTANYL CITRATE (PF) 100 MCG/2ML IJ SOLN
INTRAMUSCULAR | Status: AC
  Filled 2017-04-29: qty 2

## 2017-04-29 MED ORDER — HEPARIN (PORCINE) IN NACL 2-0.9 UNIT/ML-% IJ SOLN
INTRAMUSCULAR | Status: AC
  Filled 2017-04-29: qty 500

## 2017-04-29 MED ORDER — FENTANYL CITRATE (PF) 100 MCG/2ML IJ SOLN
INTRAMUSCULAR | Status: DC | PRN
  Administered 2017-04-29: 25 ug via INTRAVENOUS

## 2017-04-29 MED ORDER — LIDOCAINE HCL (PF) 1 % IJ SOLN
INTRAMUSCULAR | Status: AC
  Filled 2017-04-29: qty 30

## 2017-04-29 MED ORDER — SODIUM CHLORIDE 0.9 % IV SOLN
INTRAVENOUS | Status: DC
  Administered 2017-04-29: 07:00:00 via INTRAVENOUS

## 2017-04-29 SURGICAL SUPPLY — 7 items
CATH SWAN GANZ 7F STRAIGHT (CATHETERS) ×1 IMPLANT
KIT HEART LEFT (KITS) ×1 IMPLANT
KIT MICROINTRODUCER STIFF 5F (SHEATH) ×1 IMPLANT
PACK CARDIAC CATHETERIZATION (CUSTOM PROCEDURE TRAY) ×1 IMPLANT
SHEATH GLIDE SLENDER 4/5FR (SHEATH) ×1 IMPLANT
SHEATH PINNACLE 7F 10CM (SHEATH) ×1 IMPLANT
TRANSDUCER W/STOPCOCK (MISCELLANEOUS) ×1 IMPLANT

## 2017-04-29 NOTE — Discharge Instructions (Signed)
Femoral Site Care Refer to this sheet in the next few weeks. These instructions provide you with information about caring for yourself after your procedure. Your health care provider may also give you more specific instructions. Your treatment has been planned according to current medical practices, but problems sometimes occur. Call your health care provider if you have any problems or questions after your procedure. What can I expect after the procedure? After your procedure, it is typical to have the following:  Bruising at the site that usually fades within 1-2 weeks.  Blood collecting in the tissue (hematoma) that may be painful to the touch. It should usually decrease in size and tenderness within 1-2 weeks.  Follow these instructions at home:  Take medicines only as directed by your health care provider.  You may shower 24-48 hours after the procedure or as directed by your health care provider. Remove the bandage (dressing) and gently wash the site with plain soap and water. Pat the area dry with a clean towel. Do not rub the site, because this may cause bleeding.  Do not take baths, swim, or use a hot tub until your health care provider approves.  Check your insertion site every day for redness, swelling, or drainage.  Do not apply powder or lotion to the site.  Limit use of stairs to twice a day for the first 2-3 days or as directed by your health care provider.  Do not squat for the first 2-3 days or as directed by your health care provider.  Do not lift over 10 lb (4.5 kg) for 5 days after your procedure or as directed by your health care provider.  Ask your health care provider when it is okay to: ? Return to work or school. ? Resume usual physical activities or sports. ? Resume sexual activity.  Do not drive home if you are discharged the same day as the procedure. Have someone else drive you.  You may drive 24 hours after the procedure unless otherwise instructed by  your health care provider.  Do not operate machinery or power tools for 24 hours after the procedure or as directed by your health care provider.  If your procedure was done as an outpatient procedure, which means that you went home the same day as your procedure, a responsible adult should be with you for the first 24 hours after you arrive home.  Keep all follow-up visits as directed by your health care provider. This is important. Contact a health care provider if:  You have a fever.  You have chills.  You have increased bleeding from the site. Hold pressure on the site. Get help right away if:  You have unusual pain at the site.  You have redness, warmth, or swelling at the site.  You have drainage (other than a small amount of blood on the dressing) from the site.  The site is bleeding, and the bleeding does not stop after 30 minutes of holding steady pressure on the site.  Your leg or foot becomes pale, cool, tingly, or numb. This information is not intended to replace advice given to you by your health care provider. Make sure you discuss any questions you have with your health care provider. Document Released: 07/06/2014 Document Revised: 04/09/2016 Document Reviewed: 05/22/2014 Elsevier Interactive Patient Education  [employer].

## 2017-04-29 NOTE — Progress Notes (Signed)
cc'ed to pcp °

## 2017-04-29 NOTE — Telephone Encounter (Signed)
Contact patient in 4 weeks to schedule ruq u/s

## 2017-04-29 NOTE — Interval H&P Note (Signed)
History and Physical Interval Note:  04/29/2017 7:40 AM  [name]  has presented today for surgery, with the diagnosis of sob, chf  The various methods of treatment have been discussed with the patient and family. After consideration of risks, benefits and other options for treatment, the patient has consented to  Procedure(s): Right Heart Cath (N/A) as a surgical intervention .  The patient's history has been reviewed, patient examined, no change in status, stable for surgery.  I have reviewed the patient's chart and labs.  Questions were answered to the patient's satisfaction.     Adrian Prows

## 2017-04-29 NOTE — Progress Notes (Signed)
IV team consult placed after 2 fail attempts for second IV by this nurse. IV team at bedside at present.

## 2017-05-07 DIAGNOSIS — R9431 Abnormal electrocardiogram [ECG] [EKG]: Secondary | ICD-10-CM | POA: Diagnosis not present

## 2017-05-07 DIAGNOSIS — R0602 Shortness of breath: Secondary | ICD-10-CM | POA: Diagnosis not present

## 2017-05-07 DIAGNOSIS — I270 Primary pulmonary hypertension: Secondary | ICD-10-CM | POA: Diagnosis not present

## 2017-05-07 DIAGNOSIS — I2510 Atherosclerotic heart disease of native coronary artery without angina pectoris: Secondary | ICD-10-CM | POA: Diagnosis not present

## 2017-05-10 DIAGNOSIS — M79675 Pain in left toe(s): Secondary | ICD-10-CM | POA: Diagnosis not present

## 2017-05-10 DIAGNOSIS — E1149 Type 2 diabetes mellitus with other diabetic neurological complication: Secondary | ICD-10-CM | POA: Diagnosis not present

## 2017-05-10 DIAGNOSIS — K743 Primary biliary cirrhosis: Secondary | ICD-10-CM | POA: Diagnosis not present

## 2017-05-10 DIAGNOSIS — K7460 Unspecified cirrhosis of liver: Secondary | ICD-10-CM | POA: Diagnosis not present

## 2017-05-10 DIAGNOSIS — K279 Peptic ulcer, site unspecified, unspecified as acute or chronic, without hemorrhage or perforation: Secondary | ICD-10-CM | POA: Diagnosis not present

## 2017-05-10 DIAGNOSIS — B351 Tinea unguium: Secondary | ICD-10-CM | POA: Diagnosis not present

## 2017-05-10 DIAGNOSIS — M79674 Pain in right toe(s): Secondary | ICD-10-CM | POA: Diagnosis not present

## 2017-05-11 LAB — COMPREHENSIVE METABOLIC PANEL
ALT: 14 IU/L (ref 0–32)
AST: 25 IU/L (ref 0–40)
Albumin/Globulin Ratio: 1.1 — ABNORMAL LOW (ref 1.2–2.2)
Albumin: 4.2 g/dL (ref 3.5–4.7)
Alkaline Phosphatase: 110 IU/L (ref 39–117)
BUN/Creatinine Ratio: 19 (ref 12–28)
BUN: 15 mg/dL (ref 8–27)
Bilirubin Total: 0.6 mg/dL (ref 0.0–1.2)
CO2: 24 mmol/L (ref 20–29)
Calcium: 9.8 mg/dL (ref 8.7–10.3)
Chloride: 97 mmol/L (ref 96–106)
Creatinine, Ser: 0.77 mg/dL (ref 0.57–1.00)
GFR calc Af Amer: 82 mL/min/{1.73_m2} (ref >59)
GFR calc non Af Amer: 71 mL/min/{1.73_m2} (ref >59)
Globulin, Total: 3.7 g/dL (ref 1.5–4.5)
Glucose: 89 mg/dL (ref 65–99)
Potassium: 4 mmol/L (ref 3.5–5.2)
Sodium: 141 mmol/L (ref 134–144)
Total Protein: 7.9 g/dL (ref 6.0–8.5)

## 2017-05-11 LAB — CBC WITH DIFFERENTIAL/PLATELET
Basophils Absolute: 0.1 10*3/uL (ref 0.0–0.2)
Basos: 2 %
EOS (ABSOLUTE): 0.1 10*3/uL (ref 0.0–0.4)
Eos: 2 %
Hematocrit: 42 % (ref 34.0–46.6)
Hemoglobin: 14.8 g/dL (ref 11.1–15.9)
Immature Grans (Abs): 0 10*3/uL (ref 0.0–0.1)
Immature Granulocytes: 0 %
Lymphocytes Absolute: 1.8 10*3/uL (ref 0.7–3.1)
Lymphs: 40 %
MCH: 33.7 pg — ABNORMAL HIGH (ref 26.6–33.0)
MCHC: 35.2 g/dL (ref 31.5–35.7)
MCV: 96 fL (ref 79–97)
Monocytes Absolute: 0.2 10*3/uL (ref 0.1–0.9)
Monocytes: 5 %
Neutrophils Absolute: 2.2 10*3/uL (ref 1.4–7.0)
Neutrophils: 51 %
Platelets: 202 10*3/uL (ref 150–379)
RBC: 4.39 x10E6/uL (ref 3.77–5.28)
RDW: 13.9 % (ref 12.3–15.4)
WBC: 4.5 10*3/uL (ref 3.4–10.8)

## 2017-05-11 LAB — PROTIME-INR
INR: 1 (ref 0.8–1.2)
Prothrombin Time: 11.1 s (ref 9.1–12.0)

## 2017-05-11 LAB — AFP TUMOR MARKER: AFP-Tumor Marker: 12.7 ng/mL — ABNORMAL HIGH (ref 0.0–8.3)

## 2017-05-14 ENCOUNTER — Telehealth: Payer: Self-pay

## 2017-05-14 ENCOUNTER — Other Ambulatory Visit: Payer: Self-pay

## 2017-05-14 DIAGNOSIS — K7460 Unspecified cirrhosis of liver: Secondary | ICD-10-CM

## 2017-05-14 NOTE — Telephone Encounter (Signed)
Pt's daughter [name]) called office to schedule pt's US abdomen RUQ. [place] scheduled for 05/20/17 at 9:30am, pt to arrive at 9:15am. NPO after midnight. Called daughter back and informed her of appt. Letter also mailed.

## 2017-05-14 NOTE — Progress Notes (Signed)
Labs relatively stable. MELD 6 which indicated good liver function.  Her AFP is slightly increased. Await u/s findings.

## 2017-05-18 DIAGNOSIS — E118 Type 2 diabetes mellitus with unspecified complications: Secondary | ICD-10-CM | POA: Diagnosis not present

## 2017-05-18 DIAGNOSIS — I10 Essential (primary) hypertension: Secondary | ICD-10-CM | POA: Diagnosis not present

## 2017-05-20 ENCOUNTER — Ambulatory Visit (HOSPITAL_COMMUNITY)
Admission: RE | Admit: 2017-05-20 | Discharge: 2017-05-20 | Disposition: A | Discharge status: Home / Self Care | Payer: Medicare Other | Source: Ambulatory Visit | Attending: Gastroenterology | Admitting: Gastroenterology

## 2017-05-20 DIAGNOSIS — K7460 Unspecified cirrhosis of liver: Secondary | ICD-10-CM

## 2017-05-20 NOTE — Progress Notes (Signed)
LMOM to call.

## 2017-05-21 NOTE — Progress Notes (Signed)
[name] is aware.

## 2017-05-25 DIAGNOSIS — E7800 Pure hypercholesterolemia, unspecified: Secondary | ICD-10-CM | POA: Diagnosis not present

## 2017-05-25 DIAGNOSIS — E119 Type 2 diabetes mellitus without complications: Secondary | ICD-10-CM | POA: Diagnosis not present

## 2017-05-25 DIAGNOSIS — E039 Hypothyroidism, unspecified: Secondary | ICD-10-CM | POA: Diagnosis not present

## 2017-05-25 DIAGNOSIS — I10 Essential (primary) hypertension: Secondary | ICD-10-CM | POA: Diagnosis not present

## 2017-05-26 NOTE — Progress Notes (Signed)
Liver with stable cirrhosis and no mass apparent. Given increase in AFP from 8-12, let's repeat AFP again in 3 months. If significant change then would consider further liver imaging via mri.   FYI Dr. Oneida Alar.

## 2017-05-27 ENCOUNTER — Other Ambulatory Visit: Payer: Self-pay

## 2017-05-27 DIAGNOSIS — K7460 Unspecified cirrhosis of liver: Secondary | ICD-10-CM

## 2017-05-27 NOTE — Progress Notes (Signed)
Pt is aware and lab order on file.

## 2017-05-28 NOTE — Progress Notes (Signed)
See u/s result note about f/u afp.

## 2017-06-03 ENCOUNTER — Other Ambulatory Visit: Payer: Self-pay

## 2017-06-03 DIAGNOSIS — K7460 Unspecified cirrhosis of liver: Secondary | ICD-10-CM

## 2017-06-03 DIAGNOSIS — R772 Abnormality of alphafetoprotein: Secondary | ICD-10-CM

## 2017-06-11 ENCOUNTER — Ambulatory Visit (HOSPITAL_COMMUNITY): Payer: Medicare Other

## 2017-06-17 ENCOUNTER — Ambulatory Visit (HOSPITAL_COMMUNITY): Payer: Medicare Other

## 2017-06-17 ENCOUNTER — Other Ambulatory Visit: Payer: Self-pay

## 2017-06-17 DIAGNOSIS — K7460 Unspecified cirrhosis of liver: Secondary | ICD-10-CM

## 2017-06-17 DIAGNOSIS — I5032 Chronic diastolic (congestive) heart failure: Secondary | ICD-10-CM | POA: Diagnosis not present

## 2017-06-17 DIAGNOSIS — K760 Fatty (change of) liver, not elsewhere classified: Secondary | ICD-10-CM

## 2017-06-17 DIAGNOSIS — I6523 Occlusion and stenosis of bilateral carotid arteries: Secondary | ICD-10-CM | POA: Diagnosis not present

## 2017-06-17 DIAGNOSIS — I10 Essential (primary) hypertension: Secondary | ICD-10-CM | POA: Diagnosis not present

## 2017-06-17 DIAGNOSIS — I270 Primary pulmonary hypertension: Secondary | ICD-10-CM | POA: Diagnosis not present

## 2017-06-20 ENCOUNTER — Other Ambulatory Visit: Payer: Self-pay | Admitting: Nurse Practitioner

## 2017-06-20 DIAGNOSIS — K279 Peptic ulcer, site unspecified, unspecified as acute or chronic, without hemorrhage or perforation: Secondary | ICD-10-CM

## 2017-06-22 ENCOUNTER — Ambulatory Visit (HOSPITAL_COMMUNITY)
Admission: RE | Admit: 2017-06-22 | Discharge: 2017-06-22 | Disposition: A | Discharge status: Home / Self Care | Payer: Medicare Other | Source: Ambulatory Visit | Attending: Gastroenterology | Admitting: Gastroenterology

## 2017-06-22 ENCOUNTER — Encounter (HOSPITAL_COMMUNITY): Payer: Self-pay

## 2017-06-22 DIAGNOSIS — I700 Atherosclerosis of aorta: Secondary | ICD-10-CM | POA: Diagnosis not present

## 2017-06-22 DIAGNOSIS — K8689 Other specified diseases of pancreas: Secondary | ICD-10-CM | POA: Insufficient documentation

## 2017-06-22 DIAGNOSIS — K769 Liver disease, unspecified: Secondary | ICD-10-CM | POA: Diagnosis not present

## 2017-06-22 DIAGNOSIS — K760 Fatty (change of) liver, not elsewhere classified: Secondary | ICD-10-CM

## 2017-06-22 DIAGNOSIS — K7460 Unspecified cirrhosis of liver: Secondary | ICD-10-CM

## 2017-06-22 DIAGNOSIS — N289 Disorder of kidney and ureter, unspecified: Secondary | ICD-10-CM | POA: Diagnosis not present

## 2017-06-22 HISTORY — DX: Malignant (primary) neoplasm, unspecified: C80.1

## 2017-06-22 LAB — POCT I-STAT CREATININE: Creatinine, Ser: 0.8 mg/dL (ref 0.44–1.00)

## 2017-06-22 MED ORDER — IOPAMIDOL (ISOVUE-300) INJECTION 61%
100.0000 mL | Freq: Once | INTRAVENOUS | Status: AC | PRN
  Administered 2017-06-22: 100 mL via INTRAVENOUS

## 2017-06-23 ENCOUNTER — Telehealth: Payer: Self-pay | Admitting: Gastroenterology

## 2017-06-23 NOTE — Telephone Encounter (Signed)
PLEASE CALL PT. HER CT SHOWS CIRRHOSIS AND AN ATROPHIC PANCREAS WITH A POSSIBLE STONE IN THE DILATED PANCREAS DUCT. NO MASS WAS SEEN. TO COMPLETE THE EVALUATION OF HER LIVER, SHE NEEDS AN MRI W AND WO CONTRAST(Dx: CIRRHOSIS WITH ELEVATED AFP)

## 2017-06-24 ENCOUNTER — Other Ambulatory Visit: Payer: Self-pay

## 2017-06-24 DIAGNOSIS — R772 Abnormality of alphafetoprotein: Secondary | ICD-10-CM

## 2017-06-24 DIAGNOSIS — K7460 Unspecified cirrhosis of liver: Secondary | ICD-10-CM

## 2017-06-24 NOTE — Telephone Encounter (Signed)
MRI abdomen with and without contrast scheduled for 07/02/17 at 11:00am, arrive at 10:45am. NPO 4 hours prior to test. Called and informed pt's daughter, [name]. Letter also mailed.

## 2017-06-24 NOTE — Telephone Encounter (Signed)
Called and informed pt's daughter [name]). She is ok to have MRI scheduled.

## 2017-06-27 NOTE — Progress Notes (Signed)
Please nic for cbc, cmet, pt/inr, afp in 10/2017

## 2017-06-27 NOTE — Progress Notes (Signed)
Recommendations noted. MRI planned.

## 2017-06-28 ENCOUNTER — Other Ambulatory Visit: Payer: Self-pay

## 2017-06-28 DIAGNOSIS — K7460 Unspecified cirrhosis of liver: Secondary | ICD-10-CM

## 2017-06-28 NOTE — Progress Notes (Signed)
Lab orders on file for 10/25/2017.

## 2017-07-02 ENCOUNTER — Ambulatory Visit (HOSPITAL_COMMUNITY): Payer: Medicare Other

## 2017-07-02 ENCOUNTER — Telehealth: Payer: Self-pay

## 2017-07-02 NOTE — Telephone Encounter (Signed)
Pt's daughter called this morning to see if her mother could drink something because her sugar was low. I called MRI and talked with Audelia Acton and he said that is she is feeling bad that it would be best to reschedule her for an early morning appointment time. I talked with her daughter and we have rescheduled her for 07/13/17 @ 8:00 am. Daughter is aware.

## 2017-07-02 NOTE — Progress Notes (Signed)
REVIEWED-NO ADDITIONAL RECOMMENDATIONS. 

## 2017-07-02 NOTE — Telephone Encounter (Signed)
SLF orders. Noted.

## 2017-07-13 ENCOUNTER — Ambulatory Visit (HOSPITAL_COMMUNITY)
Admission: RE | Admit: 2017-07-13 | Discharge: 2017-07-13 | Disposition: A | Discharge status: Home / Self Care | Payer: Medicare Other | Source: Ambulatory Visit | Attending: Gastroenterology | Admitting: Gastroenterology

## 2017-07-13 DIAGNOSIS — Z9049 Acquired absence of other specified parts of digestive tract: Secondary | ICD-10-CM | POA: Diagnosis not present

## 2017-07-13 DIAGNOSIS — K7460 Unspecified cirrhosis of liver: Secondary | ICD-10-CM

## 2017-07-13 DIAGNOSIS — N281 Cyst of kidney, acquired: Secondary | ICD-10-CM | POA: Diagnosis not present

## 2017-07-13 DIAGNOSIS — R772 Abnormality of alphafetoprotein: Secondary | ICD-10-CM | POA: Diagnosis not present

## 2017-07-13 MED ORDER — GADOBENATE DIMEGLUMINE 529 MG/ML IV SOLN
13.0000 mL | Freq: Once | INTRAVENOUS | Status: AC | PRN
  Administered 2017-07-13: 13 mL via INTRAVENOUS

## 2017-07-20 DIAGNOSIS — I5032 Chronic diastolic (congestive) heart failure: Secondary | ICD-10-CM | POA: Diagnosis not present

## 2017-07-20 DIAGNOSIS — I10 Essential (primary) hypertension: Secondary | ICD-10-CM | POA: Diagnosis not present

## 2017-07-20 DIAGNOSIS — I270 Primary pulmonary hypertension: Secondary | ICD-10-CM | POA: Diagnosis not present

## 2017-07-22 ENCOUNTER — Other Ambulatory Visit: Payer: Self-pay

## 2017-07-22 DIAGNOSIS — K7460 Unspecified cirrhosis of liver: Secondary | ICD-10-CM

## 2017-07-26 ENCOUNTER — Telehealth: Payer: Self-pay | Admitting: Gastroenterology

## 2017-07-26 DIAGNOSIS — M79675 Pain in left toe(s): Secondary | ICD-10-CM | POA: Diagnosis not present

## 2017-07-26 DIAGNOSIS — M79674 Pain in right toe(s): Secondary | ICD-10-CM | POA: Diagnosis not present

## 2017-07-26 DIAGNOSIS — B351 Tinea unguium: Secondary | ICD-10-CM | POA: Diagnosis not present

## 2017-07-26 DIAGNOSIS — E1149 Type 2 diabetes mellitus with other diabetic neurological complication: Secondary | ICD-10-CM | POA: Diagnosis not present

## 2017-07-26 NOTE — Telephone Encounter (Signed)
RECALL FOR AFP IN 3 MONTHS

## 2017-07-26 NOTE — Telephone Encounter (Signed)
Orders have been mailed to pt.

## 2017-08-18 DIAGNOSIS — I270 Primary pulmonary hypertension: Secondary | ICD-10-CM | POA: Diagnosis not present

## 2017-08-19 DIAGNOSIS — I10 Essential (primary) hypertension: Secondary | ICD-10-CM | POA: Diagnosis not present

## 2017-08-19 DIAGNOSIS — E119 Type 2 diabetes mellitus without complications: Secondary | ICD-10-CM | POA: Diagnosis not present

## 2017-08-25 DIAGNOSIS — E119 Type 2 diabetes mellitus without complications: Secondary | ICD-10-CM | POA: Diagnosis not present

## 2017-08-25 DIAGNOSIS — F419 Anxiety disorder, unspecified: Secondary | ICD-10-CM | POA: Diagnosis not present

## 2017-08-25 DIAGNOSIS — Z23 Encounter for immunization: Secondary | ICD-10-CM | POA: Diagnosis not present

## 2017-08-25 DIAGNOSIS — I10 Essential (primary) hypertension: Secondary | ICD-10-CM | POA: Diagnosis not present

## 2017-08-30 DIAGNOSIS — K7460 Unspecified cirrhosis of liver: Secondary | ICD-10-CM | POA: Diagnosis not present

## 2017-08-31 LAB — AFP TUMOR MARKER: AFP-Tumor Marker: 7.7 ng/mL — ABNORMAL HIGH

## 2017-09-07 NOTE — Progress Notes (Signed)
AFP down and MRI in august without liver lesion. Plans for ruq u/s in 12/2017, already NIC'd. She already scheduled for labs in 10/2017.

## 2017-09-08 NOTE — Progress Notes (Signed)
Patient's daughter made aware of results and repeats labs/imaging studies

## 2017-09-20 DIAGNOSIS — R768 Other specified abnormal immunological findings in serum: Secondary | ICD-10-CM | POA: Diagnosis not present

## 2017-09-20 DIAGNOSIS — I270 Primary pulmonary hypertension: Secondary | ICD-10-CM | POA: Diagnosis not present

## 2017-09-20 DIAGNOSIS — K7469 Other cirrhosis of liver: Secondary | ICD-10-CM | POA: Diagnosis not present

## 2017-09-27 ENCOUNTER — Encounter: Payer: Self-pay | Admitting: Gastroenterology

## 2017-09-29 ENCOUNTER — Other Ambulatory Visit: Payer: Self-pay

## 2017-09-29 DIAGNOSIS — K7460 Unspecified cirrhosis of liver: Secondary | ICD-10-CM

## 2017-10-01 DIAGNOSIS — Z9889 Other specified postprocedural states: Secondary | ICD-10-CM | POA: Diagnosis not present

## 2017-10-01 DIAGNOSIS — I110 Hypertensive heart disease with heart failure: Secondary | ICD-10-CM | POA: Diagnosis not present

## 2017-10-01 DIAGNOSIS — R0902 Hypoxemia: Secondary | ICD-10-CM | POA: Diagnosis not present

## 2017-10-01 DIAGNOSIS — J984 Other disorders of lung: Secondary | ICD-10-CM | POA: Diagnosis not present

## 2017-10-01 DIAGNOSIS — I50812 Chronic right heart failure: Secondary | ICD-10-CM | POA: Diagnosis not present

## 2017-10-01 DIAGNOSIS — Z91040 Latex allergy status: Secondary | ICD-10-CM | POA: Diagnosis not present

## 2017-10-01 DIAGNOSIS — K219 Gastro-esophageal reflux disease without esophagitis: Secondary | ICD-10-CM | POA: Diagnosis not present

## 2017-10-01 DIAGNOSIS — R21 Rash and other nonspecific skin eruption: Secondary | ICD-10-CM | POA: Diagnosis not present

## 2017-10-01 DIAGNOSIS — F17200 Nicotine dependence, unspecified, uncomplicated: Secondary | ICD-10-CM | POA: Diagnosis not present

## 2017-10-01 DIAGNOSIS — R238 Other skin changes: Secondary | ICD-10-CM | POA: Diagnosis not present

## 2017-10-01 DIAGNOSIS — I2721 Secondary pulmonary arterial hypertension: Secondary | ICD-10-CM | POA: Diagnosis not present

## 2017-10-01 DIAGNOSIS — Z853 Personal history of malignant neoplasm of breast: Secondary | ICD-10-CM | POA: Diagnosis not present

## 2017-10-01 DIAGNOSIS — Z23 Encounter for immunization: Secondary | ICD-10-CM | POA: Diagnosis not present

## 2017-10-01 DIAGNOSIS — Z9221 Personal history of antineoplastic chemotherapy: Secondary | ICD-10-CM | POA: Diagnosis not present

## 2017-10-01 DIAGNOSIS — Z8719 Personal history of other diseases of the digestive system: Secondary | ICD-10-CM | POA: Diagnosis not present

## 2017-10-01 DIAGNOSIS — Z79899 Other long term (current) drug therapy: Secondary | ICD-10-CM | POA: Diagnosis not present

## 2017-10-01 DIAGNOSIS — K7460 Unspecified cirrhosis of liver: Secondary | ICD-10-CM | POA: Diagnosis not present

## 2017-10-01 DIAGNOSIS — R600 Localized edema: Secondary | ICD-10-CM | POA: Diagnosis not present

## 2017-10-01 DIAGNOSIS — K766 Portal hypertension: Secondary | ICD-10-CM | POA: Diagnosis not present

## 2017-10-01 DIAGNOSIS — Z9010 Acquired absence of unspecified breast and nipple: Secondary | ICD-10-CM | POA: Diagnosis not present

## 2017-10-01 DIAGNOSIS — Z87891 Personal history of nicotine dependence: Secondary | ICD-10-CM | POA: Diagnosis not present

## 2017-10-01 DIAGNOSIS — I2510 Atherosclerotic heart disease of native coronary artery without angina pectoris: Secondary | ICD-10-CM | POA: Diagnosis not present

## 2017-10-01 DIAGNOSIS — E1151 Type 2 diabetes mellitus with diabetic peripheral angiopathy without gangrene: Secondary | ICD-10-CM | POA: Diagnosis not present

## 2017-10-01 DIAGNOSIS — R0602 Shortness of breath: Secondary | ICD-10-CM | POA: Diagnosis not present

## 2017-10-01 DIAGNOSIS — I272 Pulmonary hypertension, unspecified: Secondary | ICD-10-CM | POA: Diagnosis not present

## 2017-10-01 DIAGNOSIS — Z794 Long term (current) use of insulin: Secondary | ICD-10-CM | POA: Diagnosis not present

## 2017-10-01 DIAGNOSIS — Z7902 Long term (current) use of antithrombotics/antiplatelets: Secondary | ICD-10-CM | POA: Diagnosis not present

## 2017-10-01 DIAGNOSIS — Z880 Allergy status to penicillin: Secondary | ICD-10-CM | POA: Diagnosis not present

## 2017-10-02 ENCOUNTER — Other Ambulatory Visit: Payer: Self-pay | Admitting: Nurse Practitioner

## 2017-10-04 DIAGNOSIS — B351 Tinea unguium: Secondary | ICD-10-CM | POA: Diagnosis not present

## 2017-10-04 DIAGNOSIS — M79675 Pain in left toe(s): Secondary | ICD-10-CM | POA: Diagnosis not present

## 2017-10-04 DIAGNOSIS — M79674 Pain in right toe(s): Secondary | ICD-10-CM | POA: Diagnosis not present

## 2017-10-04 DIAGNOSIS — E1149 Type 2 diabetes mellitus with other diabetic neurological complication: Secondary | ICD-10-CM | POA: Diagnosis not present

## 2017-10-15 DIAGNOSIS — R918 Other nonspecific abnormal finding of lung field: Secondary | ICD-10-CM | POA: Diagnosis not present

## 2017-10-15 DIAGNOSIS — R0602 Shortness of breath: Secondary | ICD-10-CM | POA: Diagnosis not present

## 2017-10-15 DIAGNOSIS — K766 Portal hypertension: Secondary | ICD-10-CM | POA: Diagnosis not present

## 2017-10-15 DIAGNOSIS — I2721 Secondary pulmonary arterial hypertension: Secondary | ICD-10-CM | POA: Diagnosis not present

## 2017-11-01 DIAGNOSIS — K7460 Unspecified cirrhosis of liver: Secondary | ICD-10-CM | POA: Diagnosis not present

## 2017-11-02 LAB — COMPREHENSIVE METABOLIC PANEL
AG Ratio: 1.2 (calc) (ref 1.0–2.5)
ALT: 9 U/L (ref 6–29)
AST: 17 U/L (ref 10–35)
Albumin: 3.8 g/dL (ref 3.6–5.1)
Alkaline phosphatase (APISO): 90 U/L (ref 33–130)
BUN: 16 mg/dL (ref 7–25)
CO2: 32 mmol/L (ref 20–32)
Calcium: 9.3 mg/dL (ref 8.6–10.4)
Chloride: 101 mmol/L (ref 98–110)
Creat: 0.81 mg/dL (ref 0.60–0.88)
Globulin: 3.1 g/dL (calc) (ref 1.9–3.7)
Glucose, Bld: 129 mg/dL (ref 65–139)
Potassium: 4.2 mmol/L (ref 3.5–5.3)
Sodium: 139 mmol/L (ref 135–146)
Total Bilirubin: 0.7 mg/dL (ref 0.2–1.2)
Total Protein: 6.9 g/dL (ref 6.1–8.1)

## 2017-11-02 LAB — CBC WITH DIFFERENTIAL/PLATELET
Basophils Absolute: 79 cells/uL (ref 0–200)
Basophils Relative: 2.4 %
Eosinophils Absolute: 122 cells/uL (ref 15–500)
Eosinophils Relative: 3.7 %
HCT: 37.1 % (ref 35.0–45.0)
Hemoglobin: 12.7 g/dL (ref 11.7–15.5)
Lymphs Abs: 1106 cells/uL (ref 850–3900)
MCH: 34 pg — ABNORMAL HIGH (ref 27.0–33.0)
MCHC: 34.2 g/dL (ref 32.0–36.0)
MCV: 99.2 fL (ref 80.0–100.0)
MPV: 10.3 fL (ref 7.5–12.5)
Monocytes Relative: 7.9 %
Neutro Abs: 1733 cells/uL (ref 1500–7800)
Neutrophils Relative %: 52.5 %
Platelets: 207 10*3/uL (ref 140–400)
RBC: 3.74 10*6/uL — ABNORMAL LOW (ref 3.80–5.10)
RDW: 11.8 % (ref 11.0–15.0)
Total Lymphocyte: 33.5 %
WBC mixed population: 261 cells/uL (ref 200–950)
WBC: 3.3 10*3/uL — ABNORMAL LOW (ref 3.8–10.8)

## 2017-11-02 LAB — PROTIME-INR
INR: 1
Prothrombin Time: 10.9 s (ref 9.0–11.5)

## 2017-11-02 LAB — AFP TUMOR MARKER: AFP-Tumor Marker: 8.5 ng/mL — ABNORMAL HIGH

## 2017-11-03 ENCOUNTER — Ambulatory Visit (INDEPENDENT_AMBULATORY_CARE_PROVIDER_SITE_OTHER): Payer: Medicare Other | Admitting: Gastroenterology

## 2017-11-03 ENCOUNTER — Other Ambulatory Visit: Payer: Self-pay

## 2017-11-03 ENCOUNTER — Encounter: Payer: Self-pay | Admitting: Gastroenterology

## 2017-11-03 VITALS — BP 132/58 | HR 63 | Temp 98.3°F | Ht 64.0 in | Wt 147.6 lb

## 2017-11-03 DIAGNOSIS — K7460 Unspecified cirrhosis of liver: Secondary | ICD-10-CM

## 2017-11-03 NOTE — Progress Notes (Signed)
Labs on file for 3 and 6 months. Forwarding to [place] to nic [place] in 12/2017.

## 2017-11-03 NOTE — Progress Notes (Signed)
She is on recall

## 2017-11-03 NOTE — Progress Notes (Signed)
Primary Care Physician: [name], MD  Primary Gastroenterologist:  Barney Drain, MD   Chief Complaint  Patient presents with  . Cirrhosis    f/u, doing ok  . Peptic Ulcer Disease    HPI: [name] is a 83 y.o. female here for follow-up.  Last seen in June to the peptic ulcer disease and cirrhosis.EGD back in October 2017 done for dysphagia. She was found to have an esophageal web status post dilation. Incidentally noted to have 5 gastric ulcers likely due to NSAIDs (naproxen). Surveillance EGD in February showed documented healing of the ulcers, she had mild gastritis. She has been on Protonix twice a day.Patient also has a history of well compensated cirrhosis and primary biliary cholangitis.  We last saw her in June. Her AFP was slightly elevated, ultrasound showed cirrhosis only.  She did have CT abdomen with and without contrast that again showed cirrhosis but no hepatoma.  MRI suggested given elevated AFP.  She had marked atrophy of the pancreatic body and tail with associated dilatation of the main duct.  Stable for 4 years making neoplastic cause unlikely.  7 mm irregular calcification of the pancreas near the junction of the normal and atrophic pancreatic, may represent intraductal stone.  Dr. Oneida Alar reviewed CT and advised for MRI.  MRI showed no hepatoma.  Bilateral nonenhancing hemorrhagic renal cyst, Bosniak 2, pancreas unremarkable.  Due for right upper quadrant ultrasound February 2019.  Her labs are up-to-date as outlined below. MELD 7. AFP back in 8 range. Plans to repeat in 3 months.   Weight stable last six months. Dr. Gwendel Hanson sent her to pulmonary hypertension specialist at [facility], Dr. Daphane Shepherd. Chest CT revealed five pulmonary nodules and will have follow up CT in 12/2017. There is some question of possible pumonary arterial HTN secondary to portal hypertension.   Clinically is has no GI or liver complaints. Appetite has been good. No swelling concerns. No abd  pain, melena, brbpr. No heartburn. Constipation controlled on metamucil.   Current Outpatient Medications  Medication Sig Dispense Refill  . acetaminophen (TYLENOL) 500 MG tablet Take 500 mg by mouth every 8 (eight) hours as needed for mild pain or moderate pain.    Marland Kitchen ALPRAZolam (XANAX) 0.25 MG tablet Take 0.25 mg by mouth 2 (two) times daily as needed for anxiety.    Marland Kitchen atorvastatin (LIPITOR) 10 MG tablet Take 1 tablet (10 mg total) by mouth daily. 30 tablet 0  . cholecalciferol (VITAMIN D) 1000 UNITS tablet Take 1,000 Units by mouth every morning.     . citalopram (CELEXA) 10 MG tablet Take 1 tablet (10 mg total) by mouth daily. (Patient taking differently: Take 20 mg by mouth daily. )    . clopidogrel (PLAVIX) 75 MG tablet Take 75 mg by mouth daily.    . clotrimazole-betamethasone (LOTRISONE) cream Apply 1 application topically 2 (two) times daily as needed (Eczema).     . docusate sodium (COLACE) 100 MG capsule Take 100 mg by mouth 2 (two) times daily.    . feeding supplement, GLUCERNA SHAKE, (GLUCERNA SHAKE) LIQD Take 237 mLs by mouth daily as needed.     . furosemide (LASIX) 40 MG tablet Take 1.5 tablets (60 mg total) by mouth daily. (Patient taking differently: Take 40 mg by mouth daily. ) 135 tablet 3  . Glycerin-Polysorbate 80 (REFRESH DRY EYE THERAPY OP) Place 1 drop into both eyes 2 (two) times daily as needed.    . hydrALAZINE (APRESOLINE) 25  MG tablet Take 1 tablet (25 mg total) by mouth 2 (two) times daily. 60 tablet 5  . insulin aspart (NOVOLOG FLEXPEN) 100 UNIT/ML FlexPen Inject 5 Units into the skin 3 (three) times daily with meals. When BG is over 100    . Insulin Glargine (LANTUS SOLOSTAR) 100 UNIT/ML Solostar Pen Inject 8 Units into the skin at bedtime.    Marland Kitchen loratadine (CLARITIN) 10 MG tablet Take 10 mg by mouth daily.    . Macitentan 10 MG TABS Take 10 mg by mouth daily.    . Methylfol-Algae-B12-Acetylcyst (CEREFOLIN NAC) 6-90.314-2-600 MG TABS Take 1 tablet by mouth daily.      . metoprolol (LOPRESSOR) 50 MG tablet Take 23m in the a.m.and 259min the pm    . mirtazapine (REMERON) 30 MG tablet Take 30 mg by mouth at bedtime.    . Multiple Vitamin (MULTIVITAMIN) tablet Take 1 tablet by mouth daily. Reported on 02/06/2016    . olmesartan (BENICAR) 20 MG tablet Take 5 mg by mouth daily.    . pantoprazole (PROTONIX) 40 MG tablet TAKE 1 TABLET TWICE A DAY BEFORE MEALS (Patient taking differently: TAKE 1 TABLET TWICE A DAY BEFORE MEALS. Changed to once a day) 180 tablet 1  . potassium chloride (K-DUR,KLOR-CON) 10 MEQ tablet Take 10 mEq by mouth daily.     . Probiotic Product (PROBIOTIC DAILY PO) Take 1 capsule by mouth daily.    . psyllium (METAMUCIL SMOOTH TEXTURE) 28 % packet Take 1 packet by mouth 2 (two) times daily as needed.     . thyroid (ARMOUR) 15 MG tablet Take 15 mg by mouth daily.    . Marland Kitchenmeclidinium bromide (INCRUSE ELLIPTA) 62.5 MCG/INH AEPB Inhale 1 puff into the lungs daily.    . Marland KitchenRSO FORTE 500 MG tablet TAKE 1 TABLET TWICE A DAY WITH MEALS 180 tablet 3   No current facility-administered medications for this visit.     Allergies as of 11/03/2017 - Review Complete 11/03/2017  Allergen Reaction Noted  . Aricept [donepezil hcl]  02/16/2014  . Namenda [memantine hcl]  02/16/2014  . Donepezil Nausea Only 01/20/2016  . Spironolactone Other (See Comments) 12/16/2015  . Codeine Rash 02/16/2014  . Indocin [indomethacin] Rash 09/11/2014  . Latex Rash 02/16/2014  . Penicillins Rash 01/11/2007   Past Medical History:  Diagnosis Date  . Acute delirium 06/23/2013  . Acute diastolic congestive heart failure ([facility]5/04/2014  . Anxiety   . Cancer ([facility]  . Carotid artery disease (HCC)    Bilateral ICA occlusion  . Chronic headaches   . Chronic liver disease    ?cirrhosis on 10/2014 CT. H/O elevated alkphos and +AMA on URSO.  . Marland Kitchenoronary atherosclerosis of native coronary artery    Mild nonobstructive disease at cardiac catheterization May 2011  . Depression   .  Essential hypertension, benign   . Fracture of femoral neck, right, closed (HCS[place]5/13/2015  . History of breast cancer   . History of GI bleed   . Hyperlipidemia   . Orthostatic hypotension    Diabetic polyneuropathy/diabetic dysautonomia   . Osteopenia   . Parotid tumor   . Pneumonia 11/2016  . Pressure ulcer, heel, right, unstageable (HCEast [place]6/12/2013  . PSVT (paroxysmal supraventricular tachycardia) ([facility] [place]  . Syncope and collapse 04/15/2010   Qualifier: Diagnosis of  By: McAngelena FormMD, ChHarrell Gave  . Type 2 diabetes mellitus (HCLou[place]  . UPPER GASTROINTESTINAL HEMORRHAGE 03/21/2007   Qualifier: Diagnosis of  By: FeJonna MunroD, CoRoderic Scarce  Past Surgical History:  Procedure Laterality Date  . ABDOMINAL HYSTERECTOMY    . APPENDECTOMY    . CHOLECYSTECTOMY  2008  . COLONOSCOPY  2010   Dr. Oneida Alar: single tubular adenoma, one hyperplastic polyp. Next TCS 2020 health permitting.  . ESOPHAGOGASTRODUODENOSCOPY N/A 08/17/2016   Procedure: ESOPHAGOGASTRODUODENOSCOPY (EGD);  Surgeon: Danie Binder, MD;  [facility];  Service: Endoscopy;  Laterality: N/A;  8:45 am  . ESOPHAGOGASTRODUODENOSCOPY N/A 01/01/2017   Procedure: ESOPHAGOGASTRODUODENOSCOPY (EGD);  Surgeon: Danie Binder, MD;  [facility];  Service: Endoscopy;  Laterality: N/A;  9:15 am  . HEMORRHOID SURGERY  1960s  . HIP ARTHROPLASTY Right 03/28/2014   Procedure: ARTHROPLASTY BIPOLAR HIP;  Surgeon: Carole Civil, MD;  Location: [facility];  Service: Orthopedics;  Laterality: Right;  . LAPAROSCOPIC APPENDECTOMY N/A 04/13/2013   Procedure: APPENDECTOMY LAPAROSCOPIC;  Surgeon: Donato Heinz, MD;  Location: [facility];  Service: General;  Laterality: N/A;  . MASTECTOMY  1994   Left breast -related to breast cancer  . RIGHT HEART CATH N/A 04/29/2017   Procedure: Right Heart Cath;  Surgeon: Adrian Prows, MD;  Location: [place] [facility];  Service: Cardiovascular;  Laterality: N/A;  . RIGHT/LEFT HEART CATH AND CORONARY  ANGIOGRAPHY N/A 04/13/2017   Procedure: RIGHT/LEFT HEART CATH AND CORONARY ANGIOGRAPHY;  Surgeon: Adrian Prows, MD;  Location: [place] [facility];  Service: Cardiovascular;  Laterality: N/A;  . SAVORY DILATION N/A 08/17/2016   Procedure: SAVORY DILATION;  Surgeon: Danie Binder, MD;  [facility];  Service: Endoscopy;  Laterality: N/A;  . VESICOVAGINAL FISTULA CLOSURE W/ TAH  1979    ROS:  General: Negative for anorexia, weight loss, fever, chills, fatigue, weakness. ENT: Negative for hoarseness, difficulty swallowing , nasal congestion. CV: Negative for chest pain, angina, palpitations, dyspnea on exertion, peripheral edema.  Respiratory: Negative for dyspnea at rest, dyspnea on exertion, cough, sputum, wheezing.  GI: See history of present illness. GU:  Negative for dysuria, hematuria, urinary incontinence, urinary frequency, nocturnal urination.  Endo: Negative for unusual weight change.    Physical Examination:   BP (!) 132/58   Pulse 63   Temp 98.3 F (36.8 C) (Oral)   Ht _0  (1.626 m)   Wt 147 lb 9.6 oz (67 kg)   BMI 25.34 kg/m   General: Well-nourished, well-developed in no acute distress.  Eyes: No icterus. Mouth: Oropharyngeal mucosa moist and pink , no lesions erythema or exudate. Lungs: Clear to auscultation bilaterally.  Heart: Regular rate and rhythm, no murmurs rubs or gallops.  Abdomen: Bowel sounds are normal, nontender, nondistended, no hepatosplenomegaly or masses, no abdominal bruits or hernia , no rebound or guarding.   Extremities: No lower extremity edema. No clubbing or deformities. Neuro: Alert and oriented x 4   Skin: Warm and dry, no jaundice.   Psych: Alert and cooperative, normal mood and affect.  Labs:  Lab Results  Component Value Date   CREATININE 0.81 11/01/2017   BUN 16 11/01/2017   NA 139 11/01/2017   K 4.2 11/01/2017   CL 101 11/01/2017   CO2 32 11/01/2017   Lab Results  Component Value Date   ALT 9 11/01/2017   AST 17  11/01/2017   ALKPHOS 110 05/10/2017   BILITOT 0.7 11/01/2017   Lab Results  Component Value Date   INR 1.0 11/01/2017   INR 1.0 05/10/2017   INR 1.1 07/30/2016   Lab Results  Component Value Date   WBC 3.3 (  L) 11/01/2017   HGB 12.7 11/01/2017   HCT 37.1 11/01/2017   MCV 99.2 11/01/2017   PLT 207 11/01/2017   AFP 8.5. Two months ago was 7.7. Five months ago 12.7. Imaging Studies: No results found.

## 2017-11-03 NOTE — Progress Notes (Signed)
Please NIC for EGD for esophageal variceal screening in 12/2018.

## 2017-11-03 NOTE — Progress Notes (Signed)
cc'd to pcp

## 2017-11-03 NOTE — Progress Notes (Signed)
Recheck AFP tumor marker in 3 months. Recheck CBC, CMET, PT/INR, AFP tumor marker in 6 months.  Make sure she is NIC for ruq u/s in 12/2017.  Patient aware of results. Discussed at ov.

## 2017-11-03 NOTE — Assessment & Plan Note (Signed)
Has been well compensated with current MELD 7. AFP has fluctuated as high as 12 in the past six months. Extensive imaging without hepatoma. Due for ruq u/s in 12/2017, repeat AFP 01/2018, full labs in 04/2018. Return to the office in 04/2018 or call sooner if needed.   Consider EGD in 12/2018 for esophageal variceal screening.

## 2017-11-03 NOTE — Patient Instructions (Addendum)
1. We will contact you for labs in 01/2018. You will be due for ultrasound of your liver in 12/2017.  2. Continue pantoprazole and metamucil. 3. We will see you back in six months for follow up.

## 2017-11-10 DIAGNOSIS — K7469 Other cirrhosis of liver: Secondary | ICD-10-CM | POA: Diagnosis not present

## 2017-11-10 DIAGNOSIS — I270 Primary pulmonary hypertension: Secondary | ICD-10-CM | POA: Diagnosis not present

## 2017-11-17 ENCOUNTER — Encounter: Payer: Self-pay | Admitting: *Deleted

## 2017-11-17 ENCOUNTER — Telehealth: Payer: Self-pay | Admitting: Gastroenterology

## 2017-11-17 NOTE — Telephone Encounter (Signed)
RECALL FOR RUQ ULTRASOUND

## 2017-11-17 NOTE — Telephone Encounter (Signed)
Recall letter mailed to pt.

## 2017-11-30 ENCOUNTER — Encounter: Payer: Self-pay | Admitting: *Deleted

## 2017-11-30 ENCOUNTER — Telehealth: Payer: Self-pay | Admitting: Gastroenterology

## 2017-11-30 ENCOUNTER — Other Ambulatory Visit: Payer: Self-pay | Admitting: *Deleted

## 2017-11-30 DIAGNOSIS — K7460 Unspecified cirrhosis of liver: Secondary | ICD-10-CM

## 2017-11-30 NOTE — Telephone Encounter (Signed)
Called spoke with [name] and made aware U/S scheduled for 01/12/18 at 9:30am, arrival time 9:15am, NPO after midnight.  Letter mailed to pt.

## 2017-11-30 NOTE — Telephone Encounter (Signed)
Pt received a letter to schedule her [place]. Please call daudghter, [name], at [phone]

## 2017-12-13 DIAGNOSIS — M79674 Pain in right toe(s): Secondary | ICD-10-CM | POA: Diagnosis not present

## 2017-12-13 DIAGNOSIS — M79675 Pain in left toe(s): Secondary | ICD-10-CM | POA: Diagnosis not present

## 2017-12-13 DIAGNOSIS — E1149 Type 2 diabetes mellitus with other diabetic neurological complication: Secondary | ICD-10-CM | POA: Diagnosis not present

## 2017-12-13 DIAGNOSIS — B351 Tinea unguium: Secondary | ICD-10-CM | POA: Diagnosis not present

## 2017-12-19 ENCOUNTER — Other Ambulatory Visit: Payer: Self-pay | Admitting: Gastroenterology

## 2017-12-19 DIAGNOSIS — K279 Peptic ulcer, site unspecified, unspecified as acute or chronic, without hemorrhage or perforation: Secondary | ICD-10-CM

## 2017-12-24 ENCOUNTER — Other Ambulatory Visit: Payer: Self-pay | Admitting: *Deleted

## 2017-12-24 ENCOUNTER — Encounter: Payer: Self-pay | Admitting: *Deleted

## 2017-12-24 DIAGNOSIS — K7460 Unspecified cirrhosis of liver: Secondary | ICD-10-CM

## 2017-12-28 DIAGNOSIS — E119 Type 2 diabetes mellitus without complications: Secondary | ICD-10-CM | POA: Diagnosis not present

## 2017-12-28 DIAGNOSIS — H524 Presbyopia: Secondary | ICD-10-CM | POA: Diagnosis not present

## 2017-12-28 DIAGNOSIS — Z961 Presence of intraocular lens: Secondary | ICD-10-CM | POA: Diagnosis not present

## 2017-12-28 DIAGNOSIS — Z794 Long term (current) use of insulin: Secondary | ICD-10-CM | POA: Diagnosis not present

## 2017-12-28 DIAGNOSIS — H43821 Vitreomacular adhesion, right eye: Secondary | ICD-10-CM | POA: Diagnosis not present

## 2017-12-28 DIAGNOSIS — Z7984 Long term (current) use of oral hypoglycemic drugs: Secondary | ICD-10-CM | POA: Diagnosis not present

## 2017-12-28 DIAGNOSIS — H5213 Myopia, bilateral: Secondary | ICD-10-CM | POA: Diagnosis not present

## 2017-12-29 DIAGNOSIS — H43821 Vitreomacular adhesion, right eye: Secondary | ICD-10-CM | POA: Diagnosis not present

## 2017-12-29 DIAGNOSIS — E119 Type 2 diabetes mellitus without complications: Secondary | ICD-10-CM | POA: Diagnosis not present

## 2017-12-29 DIAGNOSIS — Z961 Presence of intraocular lens: Secondary | ICD-10-CM | POA: Diagnosis not present

## 2018-01-06 DIAGNOSIS — K7460 Unspecified cirrhosis of liver: Secondary | ICD-10-CM | POA: Diagnosis not present

## 2018-01-07 LAB — AFP TUMOR MARKER: AFP-Tumor Marker: 8 ng/mL — ABNORMAL HIGH

## 2018-01-12 ENCOUNTER — Ambulatory Visit (HOSPITAL_COMMUNITY)
Admission: RE | Admit: 2018-01-12 | Discharge: 2018-01-12 | Disposition: A | Discharge status: Home / Self Care | Payer: Medicare Other | Source: Ambulatory Visit | Attending: Gastroenterology | Admitting: Gastroenterology

## 2018-01-12 ENCOUNTER — Encounter: Payer: Self-pay | Admitting: Gastroenterology

## 2018-01-12 DIAGNOSIS — K7460 Unspecified cirrhosis of liver: Secondary | ICD-10-CM | POA: Insufficient documentation

## 2018-01-12 DIAGNOSIS — Z9049 Acquired absence of other specified parts of digestive tract: Secondary | ICD-10-CM | POA: Insufficient documentation

## 2018-01-12 NOTE — Progress Notes (Signed)
LMOM to call.

## 2018-01-12 NOTE — Progress Notes (Signed)
Pt's daughter, [name], is aware.

## 2018-01-13 DIAGNOSIS — R911 Solitary pulmonary nodule: Secondary | ICD-10-CM | POA: Diagnosis not present

## 2018-01-13 DIAGNOSIS — R918 Other nonspecific abnormal finding of lung field: Secondary | ICD-10-CM | POA: Diagnosis not present

## 2018-01-15 NOTE — Progress Notes (Signed)
AFP stable. Extensive work up and no liver tumor seen. Latest u/s noted.    FYI Dr. Oneida Alar.

## 2018-01-17 NOTE — Progress Notes (Signed)
PT's daughter, [name], is aware.

## 2018-01-19 DIAGNOSIS — H43821 Vitreomacular adhesion, right eye: Secondary | ICD-10-CM | POA: Diagnosis not present

## 2018-01-27 DIAGNOSIS — H43821 Vitreomacular adhesion, right eye: Secondary | ICD-10-CM | POA: Diagnosis not present

## 2018-01-27 DIAGNOSIS — Z09 Encounter for follow-up examination after completed treatment for conditions other than malignant neoplasm: Secondary | ICD-10-CM | POA: Diagnosis not present

## 2018-02-09 DIAGNOSIS — I270 Primary pulmonary hypertension: Secondary | ICD-10-CM | POA: Diagnosis not present

## 2018-02-09 DIAGNOSIS — K7469 Other cirrhosis of liver: Secondary | ICD-10-CM | POA: Diagnosis not present

## 2018-02-09 DIAGNOSIS — I2510 Atherosclerotic heart disease of native coronary artery without angina pectoris: Secondary | ICD-10-CM | POA: Diagnosis not present

## 2018-02-14 ENCOUNTER — Other Ambulatory Visit (HOSPITAL_COMMUNITY): Payer: Self-pay | Admitting: Internal Medicine

## 2018-02-14 DIAGNOSIS — Z1231 Encounter for screening mammogram for malignant neoplasm of breast: Secondary | ICD-10-CM

## 2018-02-17 DIAGNOSIS — E118 Type 2 diabetes mellitus with unspecified complications: Secondary | ICD-10-CM | POA: Diagnosis not present

## 2018-02-17 DIAGNOSIS — E119 Type 2 diabetes mellitus without complications: Secondary | ICD-10-CM | POA: Diagnosis not present

## 2018-02-17 DIAGNOSIS — Z0000 Encounter for general adult medical examination without abnormal findings: Secondary | ICD-10-CM | POA: Diagnosis not present

## 2018-02-17 DIAGNOSIS — I10 Essential (primary) hypertension: Secondary | ICD-10-CM | POA: Diagnosis not present

## 2018-02-21 DIAGNOSIS — M79675 Pain in left toe(s): Secondary | ICD-10-CM | POA: Diagnosis not present

## 2018-02-21 DIAGNOSIS — M79674 Pain in right toe(s): Secondary | ICD-10-CM | POA: Diagnosis not present

## 2018-02-21 DIAGNOSIS — E1149 Type 2 diabetes mellitus with other diabetic neurological complication: Secondary | ICD-10-CM | POA: Diagnosis not present

## 2018-02-21 DIAGNOSIS — B351 Tinea unguium: Secondary | ICD-10-CM | POA: Diagnosis not present

## 2018-02-24 DIAGNOSIS — Z853 Personal history of malignant neoplasm of breast: Secondary | ICD-10-CM | POA: Diagnosis not present

## 2018-02-24 DIAGNOSIS — I10 Essential (primary) hypertension: Secondary | ICD-10-CM | POA: Diagnosis not present

## 2018-02-24 DIAGNOSIS — M8580 Other specified disorders of bone density and structure, unspecified site: Secondary | ICD-10-CM | POA: Diagnosis not present

## 2018-02-24 DIAGNOSIS — Z780 Asymptomatic menopausal state: Secondary | ICD-10-CM | POA: Diagnosis not present

## 2018-02-24 DIAGNOSIS — E119 Type 2 diabetes mellitus without complications: Secondary | ICD-10-CM | POA: Diagnosis not present

## 2018-02-24 DIAGNOSIS — Z0000 Encounter for general adult medical examination without abnormal findings: Secondary | ICD-10-CM | POA: Diagnosis not present

## 2018-02-24 DIAGNOSIS — I5030 Unspecified diastolic (congestive) heart failure: Secondary | ICD-10-CM | POA: Diagnosis not present

## 2018-03-02 ENCOUNTER — Ambulatory Visit (HOSPITAL_COMMUNITY): Payer: Medicare Other

## 2018-03-10 ENCOUNTER — Encounter: Payer: Self-pay | Admitting: Gastroenterology

## 2018-03-17 DIAGNOSIS — H33101 Unspecified retinoschisis, right eye: Secondary | ICD-10-CM | POA: Diagnosis not present

## 2018-03-17 DIAGNOSIS — H43821 Vitreomacular adhesion, right eye: Secondary | ICD-10-CM | POA: Diagnosis not present

## 2018-03-17 DIAGNOSIS — Z09 Encounter for follow-up examination after completed treatment for conditions other than malignant neoplasm: Secondary | ICD-10-CM | POA: Diagnosis not present

## 2018-03-23 ENCOUNTER — Encounter (HOSPITAL_COMMUNITY): Payer: Self-pay

## 2018-03-23 ENCOUNTER — Ambulatory Visit (HOSPITAL_COMMUNITY)
Admission: RE | Admit: 2018-03-23 | Discharge: 2018-03-23 | Disposition: A | Discharge status: Home / Self Care | Payer: Medicare Other | Source: Ambulatory Visit | Attending: Internal Medicine | Admitting: Internal Medicine

## 2018-03-23 ENCOUNTER — Ambulatory Visit (HOSPITAL_COMMUNITY): Payer: Medicare Other

## 2018-03-23 DIAGNOSIS — Z1231 Encounter for screening mammogram for malignant neoplasm of breast: Secondary | ICD-10-CM | POA: Insufficient documentation

## 2018-04-25 DIAGNOSIS — E1149 Type 2 diabetes mellitus with other diabetic neurological complication: Secondary | ICD-10-CM | POA: Diagnosis not present

## 2018-05-02 DIAGNOSIS — E1149 Type 2 diabetes mellitus with other diabetic neurological complication: Secondary | ICD-10-CM | POA: Diagnosis not present

## 2018-05-02 DIAGNOSIS — M79675 Pain in left toe(s): Secondary | ICD-10-CM | POA: Diagnosis not present

## 2018-05-02 DIAGNOSIS — M79674 Pain in right toe(s): Secondary | ICD-10-CM | POA: Diagnosis not present

## 2018-05-02 DIAGNOSIS — B351 Tinea unguium: Secondary | ICD-10-CM | POA: Diagnosis not present

## 2018-05-04 ENCOUNTER — Ambulatory Visit: Payer: Medicare Other | Admitting: Gastroenterology

## 2018-05-10 ENCOUNTER — Telehealth: Payer: Self-pay | Admitting: Gastroenterology

## 2018-05-10 NOTE — Telephone Encounter (Signed)
Letter mailed.

## 2018-05-10 NOTE — Telephone Encounter (Signed)
RECALL FOR ULTRASOUND

## 2018-05-12 DIAGNOSIS — I270 Primary pulmonary hypertension: Secondary | ICD-10-CM | POA: Diagnosis not present

## 2018-05-24 DIAGNOSIS — I509 Heart failure, unspecified: Secondary | ICD-10-CM | POA: Diagnosis not present

## 2018-05-24 DIAGNOSIS — R0989 Other specified symptoms and signs involving the circulatory and respiratory systems: Secondary | ICD-10-CM | POA: Diagnosis not present

## 2018-05-24 DIAGNOSIS — J181 Lobar pneumonia, unspecified organism: Secondary | ICD-10-CM | POA: Diagnosis not present

## 2018-05-24 DIAGNOSIS — J810 Acute pulmonary edema: Secondary | ICD-10-CM | POA: Diagnosis not present

## 2018-05-24 DIAGNOSIS — R0602 Shortness of breath: Secondary | ICD-10-CM | POA: Diagnosis not present

## 2018-05-24 DIAGNOSIS — R0609 Other forms of dyspnea: Secondary | ICD-10-CM | POA: Diagnosis not present

## 2018-05-30 DIAGNOSIS — R0989 Other specified symptoms and signs involving the circulatory and respiratory systems: Secondary | ICD-10-CM | POA: Diagnosis not present

## 2018-05-30 DIAGNOSIS — J181 Lobar pneumonia, unspecified organism: Secondary | ICD-10-CM | POA: Diagnosis not present

## 2018-05-30 DIAGNOSIS — I509 Heart failure, unspecified: Secondary | ICD-10-CM | POA: Diagnosis not present

## 2018-05-30 DIAGNOSIS — R0609 Other forms of dyspnea: Secondary | ICD-10-CM | POA: Diagnosis not present

## 2018-05-30 DIAGNOSIS — J810 Acute pulmonary edema: Secondary | ICD-10-CM | POA: Diagnosis not present

## 2018-06-03 DIAGNOSIS — K7469 Other cirrhosis of liver: Secondary | ICD-10-CM | POA: Diagnosis not present

## 2018-06-03 DIAGNOSIS — R768 Other specified abnormal immunological findings in serum: Secondary | ICD-10-CM | POA: Diagnosis not present

## 2018-06-03 DIAGNOSIS — I2510 Atherosclerotic heart disease of native coronary artery without angina pectoris: Secondary | ICD-10-CM | POA: Diagnosis not present

## 2018-06-03 DIAGNOSIS — I270 Primary pulmonary hypertension: Secondary | ICD-10-CM | POA: Diagnosis not present

## 2018-06-20 ENCOUNTER — Telehealth: Payer: Self-pay

## 2018-06-20 ENCOUNTER — Other Ambulatory Visit: Payer: Self-pay

## 2018-06-20 DIAGNOSIS — K7460 Unspecified cirrhosis of liver: Secondary | ICD-10-CM

## 2018-06-20 NOTE — Telephone Encounter (Signed)
Pt's daughter [name]) called office. Pt received recall letter for [place].   [place] abd RUQ scheduled for 07/01/18 at 9:30am, arrive at 9:15am. NPO 6 hours prior to test. Called daughter back and informed of appt. Letter mailed.

## 2018-07-01 ENCOUNTER — Ambulatory Visit (HOSPITAL_COMMUNITY)
Admission: RE | Admit: 2018-07-01 | Discharge: 2018-07-01 | Disposition: A | Discharge status: Home / Self Care | Payer: Medicare Other | Source: Ambulatory Visit | Attending: Gastroenterology | Admitting: Gastroenterology

## 2018-07-01 DIAGNOSIS — Z9049 Acquired absence of other specified parts of digestive tract: Secondary | ICD-10-CM | POA: Diagnosis not present

## 2018-07-01 DIAGNOSIS — K7460 Unspecified cirrhosis of liver: Secondary | ICD-10-CM

## 2018-07-01 DIAGNOSIS — R932 Abnormal findings on diagnostic imaging of liver and biliary tract: Secondary | ICD-10-CM | POA: Insufficient documentation

## 2018-07-04 NOTE — Progress Notes (Signed)
ON RECALL AND CC'D TO PCP

## 2018-07-06 ENCOUNTER — Encounter: Payer: Self-pay | Admitting: Gastroenterology

## 2018-07-06 ENCOUNTER — Ambulatory Visit (INDEPENDENT_AMBULATORY_CARE_PROVIDER_SITE_OTHER): Payer: Medicare Other | Admitting: Gastroenterology

## 2018-07-06 DIAGNOSIS — R1310 Dysphagia, unspecified: Secondary | ICD-10-CM | POA: Diagnosis not present

## 2018-07-06 DIAGNOSIS — K743 Primary biliary cirrhosis: Secondary | ICD-10-CM

## 2018-07-06 DIAGNOSIS — K7460 Unspecified cirrhosis of liver: Secondary | ICD-10-CM | POA: Diagnosis not present

## 2018-07-06 DIAGNOSIS — R143 Flatulence: Secondary | ICD-10-CM | POA: Insufficient documentation

## 2018-07-06 DIAGNOSIS — R1319 Other dysphagia: Secondary | ICD-10-CM

## 2018-07-06 NOTE — Patient Instructions (Addendum)
NEXT ULTRASOUND IN  FEB 2019 AND YOU CAN OBTAIN LABS AS WELL AT THAT TIME. COMPLETE ONE WEEK PRIOR TO YOUR NEXT VISIT.  ADD LACTASE 1-2 PILLS WITH MEALS OR WHEN YOU CONSUME THE [place] POPSICLES. YOU CAN TAKE IT UP TO  THREE OR FOUR TIMES A DAY.   FOLLOW UP IN 6 MOS.

## 2018-07-06 NOTE — Assessment & Plan Note (Signed)
SYMPTOMS FAIRLY WELL CONTROLLED.  CONTINUE TO MONITOR SYMPTOMS.

## 2018-07-06 NOTE — Assessment & Plan Note (Signed)
WELL COMPENSATED DISEASE.  NEXT ULTRASOUND IN  FEB 2019 AND YOU CAN OBTAIN LABS AS WELL AT THAT TIME. COMPLETE ONE WEEK PRIOR TOYOUR NEXT VISIT. FOLLOW UP IN 6 MOS.

## 2018-07-06 NOTE — Assessment & Plan Note (Addendum)
NL HFP IN DEC 2018.   CONTINUE URSO FORTE. DISCUSSED U/S RESULTS AUG 2019 AND LABS DEC 2018 TODAY. REPEAT IN 6 MOS.

## 2018-07-06 NOTE — Assessment & Plan Note (Signed)
SYMPTOMS NOT IDEALLY CONTROLLED AND MOST LIKELY DUE TO DAIRY INTAKE.   ADD LACTASE 1-2 PILLS WITH MEALS OR WHEN YOU CONSUME THE [place] POPSICLES. YOU CAN TAKE IT UPTO  THREE OR FOUR TIMES A DAY. FOLLOW UP IN 6 MOS.

## 2018-07-06 NOTE — Progress Notes (Signed)
Subjective:    Patient ID: [name], female    DOB: [birth year], 84 y.o.   MRN: [id number]  Michele Gravel, MD  HPI HAVING A LOT OF GAS. ENJOYS [place] POPSICLES. TAKING METAMUCIL EVERY DAY. HAS NODULES ON HER LUNGS. HAS PULMONARY HTN. APPETITE: GOOD. LIKES A WIDE VARIETY OF FLAVOR. TROUBLE EATING DUE TO ILL FITTING DENTURES AND WITHOUT SALT OR SUGAR. WEIGHT STABLE SINCE DEC 2018. BREATHING NOT GOOD AND NOW NEEDS 2L [place] 24 HRS. FEELS LIKE PILL GETS STUCK OCCASIONALLY.   PT DENIES FEVER, CHILLS, HEMATOCHEZIA, HEMATEMESIS, nausea, vomiting, melena, diarrhea, CHEST PAIN, CHANGE IN BOWEL IN HABITS, constipation, abdominal pain, problems swallowing,OR heartburn or indigestion.  Past Medical History:  Diagnosis Date  . Acute delirium 06/23/2013  . Acute diastolic congestive heart failure ([place]) 03/21/2014  . Anxiety   . Cancer ([place])   . Carotid artery disease (HCC)    Bilateral ICA occlusion  . Chronic headaches   . Chronic liver disease    ?cirrhosis on 10/2014 CT. H/O elevated alkphos and +AMA on URSO.  Marland Kitchen Coronary atherosclerosis of native coronary artery    Mild nonobstructive disease at cardiac catheterization May 2011  . Depression   . Essential hypertension, benign   . Fracture of femoral neck, right, closed ([place]) 03/28/2014  . History of breast cancer   . History of GI bleed   . Hyperlipidemia   . Orthostatic hypotension    Diabetic polyneuropathy/diabetic dysautonomia   . Osteopenia   . Parotid tumor   . Pneumonia 11/2016  . Pressure ulcer, heel, right, unstageable ([place]) 04/17/2014  . PSVT (paroxysmal supraventricular tachycardia) ([place])   . Syncope and collapse 04/15/2010   Qualifier: Diagnosis of  By: Angelena Form, MD, Harrell Gave    . Type 2 diabetes mellitus ([place])   . UPPER GASTROINTESTINAL HEMORRHAGE 03/21/2007   Qualifier: Diagnosis of  By: Jonna Munro MD, Roderic Scarce      Past Surgical History:  Procedure Laterality Date  . ABDOMINAL HYSTERECTOMY    . APPENDECTOMY    .  CHOLECYSTECTOMY  2008  . COLONOSCOPY  2010   Dr. Oneida Alar: single tubular adenoma, one hyperplastic polyp. Next TCS 2020 health permitting.  . ESOPHAGOGASTRODUODENOSCOPY N/A 08/17/2016   Procedure: ESOPHAGOGASTRODUODENOSCOPY (EGD);  Surgeon: Danie Binder, MD;  [facility];  Service: Endoscopy;  Laterality: N/A;  8:45 am  . ESOPHAGOGASTRODUODENOSCOPY N/A 01/01/2017   Procedure: ESOPHAGOGASTRODUODENOSCOPY (EGD);  Surgeon: Danie Binder, MD;  [facility];  Service: Endoscopy;  Laterality: N/A;  9:15 am  . HEMORRHOID SURGERY  1960s  . HIP ARTHROPLASTY Right 03/28/2014   Procedure: ARTHROPLASTY BIPOLAR HIP;  Surgeon: Carole Civil, MD;  Location: [facility];  Service: Orthopedics;  Laterality: Right;  . LAPAROSCOPIC APPENDECTOMY N/A 04/13/2013   Procedure: APPENDECTOMY LAPAROSCOPIC;  Surgeon: Donato Heinz, MD;  Location: [facility];  Service: General;  Laterality: N/A;  . MASTECTOMY  1994   Left breast -related to breast cancer  . RIGHT HEART CATH N/A 04/29/2017   Procedure: Right Heart Cath;  Surgeon: Adrian Prows, MD;  Location: [place] [facility];  Service: Cardiovascular;  Laterality: N/A;  . RIGHT/LEFT HEART CATH AND CORONARY ANGIOGRAPHY N/A 04/13/2017   Procedure: RIGHT/LEFT HEART CATH AND CORONARY ANGIOGRAPHY;  Surgeon: Adrian Prows, MD;  Location: [facility] [facility];  Service: Cardiovascular;  Laterality: N/A;  . SAVORY DILATION N/A 08/17/2016   Procedure: SAVORY DILATION;  Surgeon: Danie Binder, MD;  [facility];  Service: Endoscopy;  Laterality: N/A;  .  VESICOVAGINAL FISTULA CLOSURE W/ TAH  1979   Allergies  Allergen Reactions  . Aricept [Donepezil Hcl]     Hives, vomiting and headache  . Namenda [Memantine Hcl]     hives  . Donepezil Nausea Only  . Spironolactone Other (See Comments)    Dizziness, balance problems  . Codeine Rash  . Indocin [Indomethacin] Rash  . Latex Rash  . Penicillins Rash    Current Outpatient Medications  Medication Sig      . acetaminophen (TYLENOL) 500 MG tablet Take 500 mg by mouth every 8 (eight) hours as needed for mild pain or moderate pain.    Marland Kitchen ALPRAZolam (XANAX) 0.25 MG tablet Take 0.25 mg by mouth 2 (two) times daily as needed for anxiety.    Marland Kitchen atorvastatin (LIPITOR) 10 MG tablet Take 1 tablet (10 mg total) by mouth daily.    . cholecalciferol (VITAMIN D) 1000 UNITS tablet Take 1,000 Units by mouth every morning.     . citalopram (CELEXA) 10 MG tablet Take 1 tablet (10 mg total) by mouth daily. (Patient taking differently: Take 20 mg by mouth daily. )    . clopidogrel (PLAVIX) 75 MG tablet Take 75 mg by mouth daily.    . clotrimazole-betamethasone (LOTRISONE) cream Apply 1 application topically 2 (two) times daily as needed (Eczema).     . docusate sodium (COLACE) 100 MG capsule Take 100 mg by mouth 2 (two) times daily.    . furosemide (LASIX) 40 MG tablet Take 1.5 tablets (60 mg total) by mouth daily. (Patient taking differently: Take 40 mg by mouth daily. )    . hydrALAZINE (APRESOLINE) 25 MG tablet Take 1 tablet (25 mg total) by mouth 2 (two) times daily.    . insulin aspart (NOVOLOG FLEXPEN) 100 UNIT/ML FlexPen Inject 3 Units into the skin 3 (three) times daily with meals. When BG is over 100     . Insulin Glargine (LANTUS SOLOSTAR) 100 UNIT/ML Solostar Pen Inject 8 Units into the skin at bedtime.    . lidocaine (XYLOCAINE) 5 % ointment Apply 1 application topically as needed.    . loratadine (CLARITIN) 10 MG tablet Take 10 mg by mouth daily.    . Macitentan 10 MG TABS Take 10 mg by mouth daily.    . Methylfol-Algae-B12-Acetylcyst (CEREFOLIN NAC) 6-90.314-2-600 MG TABS Take 1 tablet by mouth daily.     . Methylfol-Methylcob-Acetylcyst (CEREFOLIN NAC) 6-2-600 MG TABS Take by mouth daily.    . metoprolol (LOPRESSOR) 50 MG tablet Take 3m in the a.m.and 229min the pm    . mirtazapine (REMERON) 30 MG tablet Take 30 mg by mouth at bedtime.    . Multiple Vitamin (MULTIVITAMIN) tablet Take 1 tablet by mouth  daily. Reported on 02/06/2016    . olmesartan (BENICAR) 20 MG tablet Take 5 mg by mouth daily.    . pantoprazole (PROTONIX) 40 MG tablet Take 40 mg by mouth daily. )    . potassium chloride (K-DUR,KLOR-CON) 10 MEQ tablet Take 10 mEq by mouth daily.     . psyllium (METAMUCIL SMOOTH TEXTURE) 28 % packet Take 1 packet by mouth 2 (two) times daily as needed.     . thyroid (ARMOUR) 15 MG tablet Take 15 mg by mouth daily.    . Marland Kitchenmeclidinium bromide (INCRUSE ELLIPTA) 62.5 MCG/INH AEPB Inhale 1 puff into the lungs daily.    . Marland KitchenRSO FORTE 500 MG tablet TAKE 1 TABLET TWICE A DAY WITH MEALS    . feeding supplement, GLUCERNA SHAKE, (  GLUCERNA SHAKE) LIQD Take 237 mLs by mouth daily as needed.     . Glycerin-Polysorbate 80 (REFRESH DRY EYE THERAPY OP) Place 1 drop into both eyes 2 (two) times daily as needed.    . Probiotic Product (PROBIOTIC DAILY PO) Take 1 capsule by mouth daily.     Review of Systems PER HPI OTHERWISE ALL SYSTEMS ARE NEGATIVE.    Objective:   Physical Exam  Constitutional: She is oriented to person, place, and time. She appears well-developed and well-nourished. No distress.  HENT:  Head: Normocephalic and atraumatic.  Mouth/Throat: Oropharynx is clear and moist. No oropharyngeal exudate.  Eyes: Pupils are equal, round, and reactive to light. No scleral icterus.  Neck: Normal range of motion. Neck supple.  Cardiovascular: Normal rate and regular rhythm.  Murmur (SYSTOLIC 1/6) heard. Pulmonary/Chest: Effort normal and breath sounds normal. No respiratory distress.  Abdominal: Soft. Bowel sounds are normal. She exhibits no distension. There is no tenderness.  Musculoskeletal: She exhibits no edema.  Lymphadenopathy:    She has no cervical adenopathy.  Neurological: She is alert and oriented to person, place, and time.  NO  NEW FOCAL DEFICITS  Psychiatric: She has a normal mood and affect.  Vitals reviewed.     Assessment & Plan:

## 2018-07-07 ENCOUNTER — Other Ambulatory Visit: Payer: Self-pay

## 2018-07-07 DIAGNOSIS — K7460 Unspecified cirrhosis of liver: Secondary | ICD-10-CM

## 2018-07-11 DIAGNOSIS — M79674 Pain in right toe(s): Secondary | ICD-10-CM | POA: Diagnosis not present

## 2018-07-11 DIAGNOSIS — M79675 Pain in left toe(s): Secondary | ICD-10-CM | POA: Diagnosis not present

## 2018-07-11 DIAGNOSIS — E1149 Type 2 diabetes mellitus with other diabetic neurological complication: Secondary | ICD-10-CM | POA: Diagnosis not present

## 2018-07-11 DIAGNOSIS — B351 Tinea unguium: Secondary | ICD-10-CM | POA: Diagnosis not present

## 2018-07-12 DIAGNOSIS — R05 Cough: Secondary | ICD-10-CM | POA: Diagnosis not present

## 2018-07-12 DIAGNOSIS — R0602 Shortness of breath: Secondary | ICD-10-CM | POA: Diagnosis not present

## 2018-07-12 DIAGNOSIS — J189 Pneumonia, unspecified organism: Secondary | ICD-10-CM | POA: Diagnosis not present

## 2018-07-12 DIAGNOSIS — J441 Chronic obstructive pulmonary disease with (acute) exacerbation: Secondary | ICD-10-CM | POA: Diagnosis not present

## 2018-07-15 DIAGNOSIS — I509 Heart failure, unspecified: Secondary | ICD-10-CM | POA: Diagnosis not present

## 2018-07-15 DIAGNOSIS — J189 Pneumonia, unspecified organism: Secondary | ICD-10-CM | POA: Diagnosis not present

## 2018-07-18 ENCOUNTER — Encounter (HOSPITAL_COMMUNITY): Payer: Self-pay | Admitting: Emergency Medicine

## 2018-07-18 ENCOUNTER — Emergency Department (HOSPITAL_COMMUNITY): Payer: Medicare Other

## 2018-07-18 ENCOUNTER — Inpatient Hospital Stay (HOSPITAL_COMMUNITY)
Admission: EM | Admit: 2018-07-18 | Discharge: 2018-07-20 | DRG: 193 | Disposition: A | Discharge status: Home Health | Payer: Medicare Other | Attending: Internal Medicine | Admitting: Internal Medicine

## 2018-07-18 ENCOUNTER — Other Ambulatory Visit: Payer: Self-pay

## 2018-07-18 DIAGNOSIS — E039 Hypothyroidism, unspecified: Secondary | ICD-10-CM | POA: Diagnosis present

## 2018-07-18 DIAGNOSIS — E785 Hyperlipidemia, unspecified: Secondary | ICD-10-CM | POA: Diagnosis present

## 2018-07-18 DIAGNOSIS — I110 Hypertensive heart disease with heart failure: Secondary | ICD-10-CM | POA: Diagnosis present

## 2018-07-18 DIAGNOSIS — R531 Weakness: Secondary | ICD-10-CM | POA: Diagnosis not present

## 2018-07-18 DIAGNOSIS — R05 Cough: Secondary | ICD-10-CM | POA: Diagnosis not present

## 2018-07-18 DIAGNOSIS — I2510 Atherosclerotic heart disease of native coronary artery without angina pectoris: Secondary | ICD-10-CM | POA: Diagnosis present

## 2018-07-18 DIAGNOSIS — E1142 Type 2 diabetes mellitus with diabetic polyneuropathy: Secondary | ICD-10-CM | POA: Diagnosis present

## 2018-07-18 DIAGNOSIS — J181 Lobar pneumonia, unspecified organism: Principal | ICD-10-CM | POA: Diagnosis present

## 2018-07-18 DIAGNOSIS — J189 Pneumonia, unspecified organism: Secondary | ICD-10-CM | POA: Diagnosis not present

## 2018-07-18 DIAGNOSIS — H538 Other visual disturbances: Secondary | ICD-10-CM | POA: Diagnosis present

## 2018-07-18 DIAGNOSIS — I5033 Acute on chronic diastolic (congestive) heart failure: Secondary | ICD-10-CM | POA: Diagnosis present

## 2018-07-18 DIAGNOSIS — Z794 Long term (current) use of insulin: Secondary | ICD-10-CM

## 2018-07-18 DIAGNOSIS — Z8249 Family history of ischemic heart disease and other diseases of the circulatory system: Secondary | ICD-10-CM

## 2018-07-18 DIAGNOSIS — I10 Essential (primary) hypertension: Secondary | ICD-10-CM | POA: Diagnosis present

## 2018-07-18 DIAGNOSIS — E119 Type 2 diabetes mellitus without complications: Secondary | ICD-10-CM

## 2018-07-18 DIAGNOSIS — Z801 Family history of malignant neoplasm of trachea, bronchus and lung: Secondary | ICD-10-CM

## 2018-07-18 DIAGNOSIS — I5032 Chronic diastolic (congestive) heart failure: Secondary | ICD-10-CM | POA: Diagnosis present

## 2018-07-18 DIAGNOSIS — Z7902 Long term (current) use of antithrombotics/antiplatelets: Secondary | ICD-10-CM

## 2018-07-18 DIAGNOSIS — Z853 Personal history of malignant neoplasm of breast: Secondary | ICD-10-CM

## 2018-07-18 DIAGNOSIS — F32A Depression, unspecified: Secondary | ICD-10-CM | POA: Diagnosis present

## 2018-07-18 DIAGNOSIS — M8580 Other specified disorders of bone density and structure, unspecified site: Secondary | ICD-10-CM | POA: Diagnosis present

## 2018-07-18 DIAGNOSIS — Z96641 Presence of right artificial hip joint: Secondary | ICD-10-CM | POA: Diagnosis present

## 2018-07-18 DIAGNOSIS — J9621 Acute and chronic respiratory failure with hypoxia: Secondary | ICD-10-CM

## 2018-07-18 DIAGNOSIS — E876 Hypokalemia: Secondary | ICD-10-CM | POA: Diagnosis not present

## 2018-07-18 DIAGNOSIS — Z8051 Family history of malignant neoplasm of kidney: Secondary | ICD-10-CM

## 2018-07-18 DIAGNOSIS — F329 Major depressive disorder, single episode, unspecified: Secondary | ICD-10-CM | POA: Diagnosis present

## 2018-07-18 MED ORDER — FLUORESCEIN SODIUM 1 MG OP STRP
1.0000 | ORAL_STRIP | Freq: Once | OPHTHALMIC | Status: AC
  Administered 2018-07-18: 1 via OPHTHALMIC
  Filled 2018-07-18: qty 1

## 2018-07-18 MED ORDER — TETRACAINE HCL 0.5 % OP SOLN
2.0000 [drp] | Freq: Once | OPHTHALMIC | Status: AC
  Administered 2018-07-18: 2 [drp] via OPHTHALMIC
  Filled 2018-07-18: qty 4

## 2018-07-18 NOTE — ED Triage Notes (Signed)
Family member states patient is being treated for pneumonia and is on levaquin at this time. States patient has had fever, chills, tremors, and generalized weakness starting today. Also complaining of blurry vision and pain from left eye x 4 days.

## 2018-07-19 DIAGNOSIS — E038 Other specified hypothyroidism: Secondary | ICD-10-CM | POA: Diagnosis not present

## 2018-07-19 DIAGNOSIS — I5032 Chronic diastolic (congestive) heart failure: Secondary | ICD-10-CM | POA: Diagnosis not present

## 2018-07-19 DIAGNOSIS — I10 Essential (primary) hypertension: Secondary | ICD-10-CM | POA: Diagnosis not present

## 2018-07-19 DIAGNOSIS — Z7902 Long term (current) use of antithrombotics/antiplatelets: Secondary | ICD-10-CM | POA: Diagnosis not present

## 2018-07-19 DIAGNOSIS — E039 Hypothyroidism, unspecified: Secondary | ICD-10-CM | POA: Diagnosis present

## 2018-07-19 DIAGNOSIS — I5033 Acute on chronic diastolic (congestive) heart failure: Secondary | ICD-10-CM | POA: Diagnosis present

## 2018-07-19 DIAGNOSIS — Z801 Family history of malignant neoplasm of trachea, bronchus and lung: Secondary | ICD-10-CM | POA: Diagnosis not present

## 2018-07-19 DIAGNOSIS — J9621 Acute and chronic respiratory failure with hypoxia: Secondary | ICD-10-CM | POA: Diagnosis not present

## 2018-07-19 DIAGNOSIS — Z8051 Family history of malignant neoplasm of kidney: Secondary | ICD-10-CM | POA: Diagnosis not present

## 2018-07-19 DIAGNOSIS — Z96641 Presence of right artificial hip joint: Secondary | ICD-10-CM | POA: Diagnosis present

## 2018-07-19 DIAGNOSIS — R05 Cough: Secondary | ICD-10-CM | POA: Diagnosis not present

## 2018-07-19 DIAGNOSIS — I110 Hypertensive heart disease with heart failure: Secondary | ICD-10-CM | POA: Diagnosis present

## 2018-07-19 DIAGNOSIS — Z8249 Family history of ischemic heart disease and other diseases of the circulatory system: Secondary | ICD-10-CM | POA: Diagnosis not present

## 2018-07-19 DIAGNOSIS — M8580 Other specified disorders of bone density and structure, unspecified site: Secondary | ICD-10-CM | POA: Diagnosis present

## 2018-07-19 DIAGNOSIS — E1142 Type 2 diabetes mellitus with diabetic polyneuropathy: Secondary | ICD-10-CM | POA: Diagnosis present

## 2018-07-19 DIAGNOSIS — E1165 Type 2 diabetes mellitus with hyperglycemia: Secondary | ICD-10-CM | POA: Diagnosis not present

## 2018-07-19 DIAGNOSIS — Z794 Long term (current) use of insulin: Secondary | ICD-10-CM | POA: Diagnosis not present

## 2018-07-19 DIAGNOSIS — H538 Other visual disturbances: Secondary | ICD-10-CM | POA: Diagnosis present

## 2018-07-19 DIAGNOSIS — R531 Weakness: Secondary | ICD-10-CM

## 2018-07-19 DIAGNOSIS — F329 Major depressive disorder, single episode, unspecified: Secondary | ICD-10-CM | POA: Diagnosis not present

## 2018-07-19 DIAGNOSIS — Z853 Personal history of malignant neoplasm of breast: Secondary | ICD-10-CM | POA: Diagnosis not present

## 2018-07-19 DIAGNOSIS — J181 Lobar pneumonia, unspecified organism: Secondary | ICD-10-CM | POA: Diagnosis not present

## 2018-07-19 DIAGNOSIS — J189 Pneumonia, unspecified organism: Secondary | ICD-10-CM | POA: Diagnosis present

## 2018-07-19 DIAGNOSIS — E876 Hypokalemia: Secondary | ICD-10-CM | POA: Diagnosis not present

## 2018-07-19 DIAGNOSIS — I2510 Atherosclerotic heart disease of native coronary artery without angina pectoris: Secondary | ICD-10-CM | POA: Diagnosis present

## 2018-07-19 DIAGNOSIS — E785 Hyperlipidemia, unspecified: Secondary | ICD-10-CM | POA: Diagnosis not present

## 2018-07-19 LAB — CBC WITH DIFFERENTIAL/PLATELET
Basophils Absolute: 0.1 10*3/uL (ref 0.0–0.1)
Basophils Relative: 2 %
Eosinophils Absolute: 0.1 10*3/uL (ref 0.0–0.7)
Eosinophils Relative: 2 %
HCT: 34.8 % — ABNORMAL LOW (ref 36.0–46.0)
Hemoglobin: 11.8 g/dL — ABNORMAL LOW (ref 12.0–15.0)
Lymphocytes Relative: 34 %
Lymphs Abs: 1.3 10*3/uL (ref 0.7–4.0)
MCH: 33.2 pg (ref 26.0–34.0)
MCHC: 33.9 g/dL (ref 30.0–36.0)
MCV: 98 fL (ref 78.0–100.0)
Monocytes Absolute: 0.4 10*3/uL (ref 0.1–1.0)
Monocytes Relative: 10 %
Neutro Abs: 2 10*3/uL (ref 1.7–7.7)
Neutrophils Relative %: 52 %
Platelets: 222 10*3/uL (ref 150–400)
RBC: 3.55 MIL/uL — ABNORMAL LOW (ref 3.87–5.11)
RDW: 13.5 % (ref 11.5–15.5)
WBC: 3.9 10*3/uL — ABNORMAL LOW (ref 4.0–10.5)

## 2018-07-19 LAB — COMPREHENSIVE METABOLIC PANEL
ALT: 11 U/L (ref 0–44)
AST: 20 U/L (ref 15–41)
Albumin: 3.5 g/dL (ref 3.5–5.0)
Alkaline Phosphatase: 81 U/L (ref 38–126)
Anion gap: 9 (ref 5–15)
BUN: 25 mg/dL — ABNORMAL HIGH (ref 8–23)
CO2: 26 mmol/L (ref 22–32)
Calcium: 9 mg/dL (ref 8.9–10.3)
Chloride: 98 mmol/L (ref 98–111)
Creatinine, Ser: 1.08 mg/dL — ABNORMAL HIGH (ref 0.44–1.00)
GFR calc Af Amer: 53 mL/min — ABNORMAL LOW (ref >60)
GFR calc non Af Amer: 45 mL/min — ABNORMAL LOW (ref >60)
Glucose, Bld: 99 mg/dL (ref 70–99)
Potassium: 3.5 mmol/L (ref 3.5–5.1)
Sodium: 133 mmol/L — ABNORMAL LOW (ref 135–145)
Total Bilirubin: 0.7 mg/dL (ref 0.3–1.2)
Total Protein: 7.1 g/dL (ref 6.5–8.1)

## 2018-07-19 LAB — EXPECTORATED SPUTUM ASSESSMENT W GRAM STAIN, RFLX TO RESP C

## 2018-07-19 LAB — URINALYSIS, ROUTINE W REFLEX MICROSCOPIC
Bilirubin Urine: NEGATIVE
Glucose, UA: NEGATIVE mg/dL
Hgb urine dipstick: NEGATIVE
Ketones, ur: NEGATIVE mg/dL
Leukocytes, UA: NEGATIVE
Nitrite: NEGATIVE
Protein, ur: NEGATIVE mg/dL
Specific Gravity, Urine: 1.01 (ref 1.005–1.030)
pH: 6 (ref 5.0–8.0)

## 2018-07-19 LAB — GLUCOSE, CAPILLARY
Glucose-Capillary: 111 mg/dL — ABNORMAL HIGH (ref 70–99)
Glucose-Capillary: 150 mg/dL — ABNORMAL HIGH (ref 70–99)
Glucose-Capillary: 180 mg/dL — ABNORMAL HIGH (ref 70–99)
Glucose-Capillary: 178 mg/dL — ABNORMAL HIGH (ref 70–99)

## 2018-07-19 LAB — BASIC METABOLIC PANEL
Anion gap: 8 (ref 5–15)
BUN: 21 mg/dL (ref 8–23)
CO2: 25 mmol/L (ref 22–32)
Calcium: 8.5 mg/dL — ABNORMAL LOW (ref 8.9–10.3)
Chloride: 101 mmol/L (ref 98–111)
Creatinine, Ser: 0.94 mg/dL (ref 0.44–1.00)
GFR calc Af Amer: >60 mL/min (ref >60)
GFR calc non Af Amer: 54 mL/min — ABNORMAL LOW (ref >60)
Glucose, Bld: 116 mg/dL — ABNORMAL HIGH (ref 70–99)
Potassium: 3.4 mmol/L — ABNORMAL LOW (ref 3.5–5.1)
Sodium: 134 mmol/L — ABNORMAL LOW (ref 135–145)

## 2018-07-19 LAB — AMMONIA: Ammonia: 20 umol/L (ref 9–35)

## 2018-07-19 LAB — I-STAT CG4 LACTIC ACID, ED
Lactic Acid, Venous: 0.66 mmol/L (ref 0.5–1.9)
Lactic Acid, Venous: 0.78 mmol/L (ref 0.5–1.9)

## 2018-07-19 LAB — PHOSPHORUS: Phosphorus: 3.3 mg/dL (ref 2.5–4.6)

## 2018-07-19 LAB — MAGNESIUM: Magnesium: 1.7 mg/dL (ref 1.7–2.4)

## 2018-07-19 LAB — STREP PNEUMONIAE URINARY ANTIGEN: Strep Pneumo Urinary Antigen: NEGATIVE

## 2018-07-19 MED ORDER — INSULIN ASPART 100 UNIT/ML ~~LOC~~ SOLN
3.0000 [IU] | Freq: Three times a day (TID) | SUBCUTANEOUS | Status: DC
  Administered 2018-07-19 – 2018-07-20 (×2): 3 [IU] via SUBCUTANEOUS
  Filled 2018-07-19 (×27): qty 0.03

## 2018-07-19 MED ORDER — FUROSEMIDE 40 MG PO TABS: 40.0000 mg | ORAL_TABLET | Freq: Every day | ORAL | Status: DC

## 2018-07-19 MED ORDER — POTASSIUM CHLORIDE CRYS ER 10 MEQ PO TBCR
10.0000 meq | EXTENDED_RELEASE_TABLET | Freq: Every day | ORAL | Status: DC
  Administered 2018-07-19 – 2018-07-20 (×2): 10 meq via ORAL
  Filled 2018-07-19 (×2): qty 1

## 2018-07-19 MED ORDER — SODIUM CHLORIDE 0.9 % IV SOLN
500.0000 mg | INTRAVENOUS | Status: DC
  Administered 2018-07-20: 500 mg via INTRAVENOUS
  Filled 2018-07-19 (×2): qty 500

## 2018-07-19 MED ORDER — DM-GUAIFENESIN ER 30-600 MG PO TB12
1.0000 | ORAL_TABLET | Freq: Two times a day (BID) | ORAL | Status: DC
  Administered 2018-07-19 – 2018-07-20 (×3): 1 via ORAL
  Filled 2018-07-19 (×3): qty 1

## 2018-07-19 MED ORDER — ORAL CARE MOUTH RINSE
15.0000 mL | Freq: Two times a day (BID) | OROMUCOSAL | Status: DC
  Administered 2018-07-19 – 2018-07-20 (×3): 15 mL via OROMUCOSAL

## 2018-07-19 MED ORDER — ACETAMINOPHEN 650 MG RE SUPP: 650.0000 mg | Freq: Four times a day (QID) | RECTAL | Status: DC | PRN

## 2018-07-19 MED ORDER — URSODIOL 500 MG PO TABS: 500.0000 mg | ORAL_TABLET | Freq: Two times a day (BID) | ORAL | Status: DC

## 2018-07-19 MED ORDER — FUROSEMIDE 10 MG/ML IJ SOLN
40.0000 mg | Freq: Every day | INTRAMUSCULAR | Status: DC
  Administered 2018-07-20: 40 mg via INTRAVENOUS
  Filled 2018-07-19: qty 4

## 2018-07-19 MED ORDER — INSULIN GLARGINE 100 UNIT/ML ~~LOC~~ SOLN
8.0000 [IU] | Freq: Every day | SUBCUTANEOUS | Status: DC
  Administered 2018-07-19: 8 [IU] via SUBCUTANEOUS
  Filled 2018-07-19 (×2): qty 0.08

## 2018-07-19 MED ORDER — ONDANSETRON HCL 4 MG PO TABS: 4.0000 mg | ORAL_TABLET | Freq: Four times a day (QID) | ORAL | Status: DC | PRN

## 2018-07-19 MED ORDER — HYDRALAZINE HCL 25 MG PO TABS
25.0000 mg | ORAL_TABLET | Freq: Two times a day (BID) | ORAL | Status: DC
  Administered 2018-07-19 – 2018-07-20 (×2): 25 mg via ORAL
  Filled 2018-07-19 (×2): qty 1

## 2018-07-19 MED ORDER — IPRATROPIUM-ALBUTEROL 0.5-2.5 (3) MG/3ML IN SOLN
3.0000 mL | Freq: Three times a day (TID) | RESPIRATORY_TRACT | Status: DC
  Administered 2018-07-20 (×2): 3 mL via RESPIRATORY_TRACT
  Filled 2018-07-19 (×2): qty 3

## 2018-07-19 MED ORDER — UMECLIDINIUM BROMIDE 62.5 MCG/INH IN AEPB
1.0000 | INHALATION_SPRAY | Freq: Every day | RESPIRATORY_TRACT | Status: DC
  Filled 2018-07-19: qty 7

## 2018-07-19 MED ORDER — CLOPIDOGREL BISULFATE 75 MG PO TABS
75.0000 mg | ORAL_TABLET | Freq: Every day | ORAL | Status: DC
  Administered 2018-07-19 – 2018-07-20 (×2): 75 mg via ORAL
  Filled 2018-07-19 (×2): qty 1

## 2018-07-19 MED ORDER — ENOXAPARIN SODIUM 40 MG/0.4ML ~~LOC~~ SOLN
40.0000 mg | SUBCUTANEOUS | Status: DC
  Administered 2018-07-19 – 2018-07-20 (×2): 40 mg via SUBCUTANEOUS
  Filled 2018-07-19 (×2): qty 0.4

## 2018-07-19 MED ORDER — MAGNESIUM SULFATE 2 GM/50ML IV SOLN
2.0000 g | Freq: Once | INTRAVENOUS | Status: AC
  Administered 2018-07-20: 2 g via INTRAVENOUS
  Filled 2018-07-19: qty 50

## 2018-07-19 MED ORDER — ONDANSETRON HCL 4 MG/2ML IJ SOLN: 4.0000 mg | Freq: Four times a day (QID) | INTRAMUSCULAR | Status: DC | PRN

## 2018-07-19 MED ORDER — METOPROLOL TARTRATE 25 MG PO TABS
37.5000 mg | ORAL_TABLET | Freq: Two times a day (BID) | ORAL | Status: DC
  Administered 2018-07-19: 37.5 mg via ORAL
  Filled 2018-07-19: qty 2

## 2018-07-19 MED ORDER — ADULT MULTIVITAMIN W/MINERALS CH
1.0000 | ORAL_TABLET | Freq: Every day | ORAL | Status: DC
  Administered 2018-07-19 – 2018-07-20 (×2): 1 via ORAL
  Filled 2018-07-19 (×2): qty 1

## 2018-07-19 MED ORDER — MIRTAZAPINE 30 MG PO TABS
30.0000 mg | ORAL_TABLET | Freq: Every day | ORAL | Status: DC
  Administered 2018-07-19: 30 mg via ORAL
  Filled 2018-07-19: qty 1

## 2018-07-19 MED ORDER — POTASSIUM CHLORIDE CRYS ER 20 MEQ PO TBCR
20.0000 meq | EXTENDED_RELEASE_TABLET | Freq: Once | ORAL | Status: AC
  Administered 2018-07-20: 20 meq via ORAL
  Filled 2018-07-19: qty 1

## 2018-07-19 MED ORDER — METOPROLOL TARTRATE 5 MG/5ML IV SOLN
5.0000 mg | Freq: Once | INTRAVENOUS | Status: AC
  Administered 2018-07-19: 5 mg via INTRAVENOUS
  Filled 2018-07-19: qty 5

## 2018-07-19 MED ORDER — LORATADINE 10 MG PO TABS
10.0000 mg | ORAL_TABLET | Freq: Every day | ORAL | Status: DC
  Administered 2018-07-19 – 2018-07-20 (×2): 10 mg via ORAL
  Filled 2018-07-19 (×2): qty 1

## 2018-07-19 MED ORDER — THYROID 30 MG PO TABS
15.0000 mg | ORAL_TABLET | Freq: Every day | ORAL | Status: DC
  Administered 2018-07-20: 15 mg via ORAL
  Filled 2018-07-19 (×4): qty 1

## 2018-07-19 MED ORDER — SODIUM CHLORIDE 0.9 % IV BOLUS
500.0000 mL | Freq: Once | INTRAVENOUS | Status: AC
  Administered 2018-07-19: 500 mL via INTRAVENOUS

## 2018-07-19 MED ORDER — MACITENTAN 10 MG PO TABS
10.0000 mg | ORAL_TABLET | Freq: Every day | ORAL | Status: DC
  Administered 2018-07-20: 10 mg via ORAL
  Filled 2018-07-19 (×3): qty 1

## 2018-07-19 MED ORDER — THYROID 30 MG PO TABS: 15.0000 mg | ORAL_TABLET | Freq: Every day | ORAL | Status: DC

## 2018-07-19 MED ORDER — PANTOPRAZOLE SODIUM 40 MG PO TBEC
40.0000 mg | DELAYED_RELEASE_TABLET | Freq: Every day | ORAL | Status: DC
  Administered 2018-07-19 – 2018-07-20 (×2): 40 mg via ORAL
  Filled 2018-07-19 (×2): qty 1

## 2018-07-19 MED ORDER — CEREFOLIN NAC 6-90.314-2-600 MG PO TABS: 1.0000 | ORAL_TABLET | Freq: Every day | ORAL | Status: DC

## 2018-07-19 MED ORDER — DOCUSATE SODIUM 100 MG PO CAPS
100.0000 mg | ORAL_CAPSULE | Freq: Two times a day (BID) | ORAL | Status: DC
  Administered 2018-07-19 – 2018-07-20 (×3): 100 mg via ORAL
  Filled 2018-07-19 (×3): qty 1

## 2018-07-19 MED ORDER — ATORVASTATIN CALCIUM 10 MG PO TABS
10.0000 mg | ORAL_TABLET | Freq: Every day | ORAL | Status: DC
  Administered 2018-07-19 – 2018-07-20 (×2): 10 mg via ORAL
  Filled 2018-07-19 (×2): qty 1

## 2018-07-19 MED ORDER — SODIUM CHLORIDE 0.9 % IV SOLN
500.0000 mg | Freq: Once | INTRAVENOUS | Status: AC
  Administered 2018-07-19: 500 mg via INTRAVENOUS
  Filled 2018-07-19: qty 500

## 2018-07-19 MED ORDER — GLUCERNA SHAKE PO LIQD
237.0000 mL | Freq: Two times a day (BID) | ORAL | Status: DC
  Administered 2018-07-19 – 2018-07-20 (×3): 237 mL via ORAL

## 2018-07-19 MED ORDER — IPRATROPIUM-ALBUTEROL 0.5-2.5 (3) MG/3ML IN SOLN
3.0000 mL | Freq: Four times a day (QID) | RESPIRATORY_TRACT | Status: DC
  Administered 2018-07-19 (×2): 3 mL via RESPIRATORY_TRACT
  Filled 2018-07-19 (×2): qty 3

## 2018-07-19 MED ORDER — SODIUM CHLORIDE 0.9 % IV SOLN
1.0000 g | INTRAVENOUS | Status: DC
  Administered 2018-07-20: 1 g via INTRAVENOUS
  Filled 2018-07-19: qty 10
  Filled 2018-07-19: qty 1

## 2018-07-19 MED ORDER — ALPRAZOLAM 0.25 MG PO TABS: 0.2500 mg | ORAL_TABLET | Freq: Two times a day (BID) | ORAL | Status: DC | PRN

## 2018-07-19 MED ORDER — ENSURE ENLIVE PO LIQD
237.0000 mL | Freq: Two times a day (BID) | ORAL | Status: DC
  Administered 2018-07-19 – 2018-07-20 (×3): 237 mL via ORAL

## 2018-07-19 MED ORDER — SODIUM CHLORIDE 0.9 % IV SOLN
1.0000 g | Freq: Once | INTRAVENOUS | Status: AC
  Administered 2018-07-19: 1 g via INTRAVENOUS
  Filled 2018-07-19: qty 10

## 2018-07-19 MED ORDER — IRBESARTAN 150 MG PO TABS
150.0000 mg | ORAL_TABLET | Freq: Every day | ORAL | Status: DC
  Administered 2018-07-20: 150 mg via ORAL
  Filled 2018-07-19 (×2): qty 1

## 2018-07-19 MED ORDER — ACETAMINOPHEN 325 MG PO TABS: 650.0000 mg | ORAL_TABLET | Freq: Four times a day (QID) | ORAL | Status: DC | PRN

## 2018-07-19 MED ORDER — THYROID 30 MG PO TABS
15.0000 mg | ORAL_TABLET | Freq: Every day | ORAL | Status: DC
  Filled 2018-07-19 (×2): qty 1

## 2018-07-19 MED ORDER — VITAMIN D 1000 UNITS PO TABS
1000.0000 [IU] | ORAL_TABLET | Freq: Every morning | ORAL | Status: DC
  Administered 2018-07-19 – 2018-07-20 (×2): 1000 [IU] via ORAL
  Filled 2018-07-19 (×2): qty 1

## 2018-07-19 MED ORDER — CITALOPRAM HYDROBROMIDE 20 MG PO TABS
10.0000 mg | ORAL_TABLET | Freq: Every day | ORAL | Status: DC
  Administered 2018-07-19 – 2018-07-20 (×2): 10 mg via ORAL
  Filled 2018-07-19 (×2): qty 1

## 2018-07-19 NOTE — ED Provider Notes (Signed)
[facility] EMERGENCY DEPARTMENT Provider Note   CSN: [id number] Arrival date & time: 07/18/18  2250     History   Chief Complaint Chief Complaint  Patient presents with  . Fever    HPI [name] is a 84 y.o. female.  Patient with multiple complaints.  Her daughter thinks this is due to side effects from Levaquin.  She was diagnosed with pneumonia in August 27 and has been taking 7 days of Levaquin since.  Over the past several days patient has had chills, tremors, generalized weakness and poor appetite with generalized weakness and dizziness.  She is also had decreased vision and blurriness in her left eye for the past 4 days.  Denies seeing any black spots.  She said retina surgery in the right eye previously.  Denies any eye pain.  Denies any headache.  No chest pain or shortness of breath.  No abdominal pain, nausea or vomiting.  No focal weakness, numbness or tingling.  The history is provided by the patient and a relative.    Past Medical History:  Diagnosis Date  . Acute delirium 06/23/2013  . Acute diastolic congestive heart failure ([place]) 03/21/2014  . Anxiety   . Cancer ([place])   . Carotid artery disease (HCC)    Bilateral ICA occlusion  . Chronic headaches   . Chronic liver disease    ?cirrhosis on 10/2014 CT. H/O elevated alkphos and +AMA on URSO.  Marland Kitchen Coronary atherosclerosis of native coronary artery    Mild nonobstructive disease at cardiac catheterization May 2011  . Depression   . Essential hypertension, benign   . Fracture of femoral neck, right, closed ([place]) 03/28/2014  . History of breast cancer   . History of GI bleed   . Hyperlipidemia   . Orthostatic hypotension    Diabetic polyneuropathy/diabetic dysautonomia   . Osteopenia   . Parotid tumor   . Pneumonia 11/2016  . Pressure ulcer, heel, right, unstageable ([place]) 04/17/2014  . PSVT (paroxysmal supraventricular tachycardia) ([place])   . Syncope and collapse 04/15/2010   Qualifier: Diagnosis of  By:  Angelena Form, MD, Harrell Gave    . Type 2 diabetes mellitus ([place])   . UPPER GASTROINTESTINAL HEMORRHAGE 03/21/2007   Qualifier: Diagnosis of  By: Jonna Munro MD, Cornelius      Patient Active Problem List   Diagnosis Date Noted  . Flatulence/wind 07/06/2018  . Pulmonary hypertension ([place]) 04/25/2017  . Dysphagia 07/30/2016  . Hypothyroid 12/16/2015  . Cirrhosis of liver ([place]) 07/17/2015  . Congestive heart disease ([place])   . Type 2 diabetes mellitus without complication ([place])   . Diastolic CHF, chronic ([place]) 10/25/2014  . Bloating 06/27/2014  . SVT (supraventricular tachycardia) ([place]) 04/17/2014  . Pressure ulcer, heel, right, unstageable ([place]) 04/17/2014  . Anemia 04/17/2014  . Fracture of femoral neck, right, closed ([place]) 03/28/2014  . Hip fracture ([place]) 03/21/2014  . Acute diastolic congestive heart failure ([place]) 03/21/2014  . Abnormal carotid duplex scan 02/20/2014  . Orthostatic hypotension 02/20/2014  . Right sided weakness 02/16/2014  . Loss of weight 06/23/2013  . Primary biliary cirrhosis ([place]) 06/12/2013  . Leg weakness, bilateral 06/12/2013  . PSVT (paroxysmal supraventricular tachycardia) ([place]) 06/12/2013  . Chronic cough 11/28/2011  . Dyspnea 10/21/2011  . H/O tobacco use, presenting hazards to health 10/21/2011  . CORONARY ATHEROSCLEROSIS NATIVE CORONARY ARTERY 04/15/2010  . DIABETES MELLITUS, TYPE II, UNCONTROLLED 07/30/2008  . TREMOR, LEFT HAND 05/07/2008  . SCAR CONDITION AND FIBROSIS OF SKIN 03/05/2008  . Hyperlipidemia 10/11/2006  .  Anxiety state 10/11/2006  . Depression 10/11/2006  . Essential hypertension 10/11/2006  . OSTEOPENIA 10/11/2006  . NEOPLASM, MALIGNANT, BREAST, HX OF 10/11/2006    Past Surgical History:  Procedure Laterality Date  . ABDOMINAL HYSTERECTOMY    . APPENDECTOMY    . CHOLECYSTECTOMY  2008  . COLONOSCOPY  2010   Dr. Oneida Alar: single tubular adenoma, one hyperplastic polyp. Next TCS 2020 health permitting.  . ESOPHAGOGASTRODUODENOSCOPY  N/A 08/17/2016   Procedure: ESOPHAGOGASTRODUODENOSCOPY (EGD);  Surgeon: Danie Binder, MD;  Location: [facility] END[facility] SU[facility];  Service: Endoscopy;  Laterality: N/A;  8:45 am  . ESOPHAGOGASTRODUODENOSCOPY N/A 01/01/2017   Procedure: ESOPHAGOGASTRODUODENOSCOPY (EGD);  Surgeon: Danie Binder, MD;  Location: [facility] ENDO SU[facility];  Service: Endoscopy;  Laterality: N/A;  9:15 am  . HEMORRHOID SURGERY  1960s  . HIP ARTHROPLASTY Right 03/28/2014   Procedure: ARTHROPLASTY BIPOLAR HIP;  Surgeon: Carole Civil, MD;  Location: [facility] ORS;  Service: Orthopedics;  Laterality: Right;  . LAPAROSCOPIC APPENDECTOMY N/A 04/13/2013   Procedure: APPENDECTOMY LAPAROSCOPIC;  Surgeon: Donato Heinz, MD;  Location: [facility] ORS;  Service: General;  Laterality: N/A;  . MASTECTOMY  1994   Left breast -related to breast cancer  . RIGHT HEART CATH N/A 04/29/2017   Procedure: Right Heart Cath;  Surgeon: Adrian Prows, MD;  Location: [place] CV LAB;  Service: Cardiovascular;  Laterality: N/A;  . RIGHT/LEFT HEART CATH AND CORONARY ANGIOGRAPHY N/A 04/13/2017   Procedure: RIGHT/LEFT HEART CATH AND CORONARY ANGIOGRAPHY;  Surgeon: Adrian Prows, MD;  Location: [place] CV LAB;  Service: Cardiovascular;  Laterality: N/A;  . SAVORY DILATION N/A 08/17/2016   Procedure: SAVORY DILATION;  Surgeon: Danie Binder, MD;  Location: [facility] END[facility];  Service: Endoscopy;  Laterality: N/A;  . VESICOVAGINAL FISTULA CLOSURE W/ TAH  1979     OB History   None      Home Medications    Prior to Admission medications   Medication Sig Start Date End Date Taking? Authorizing Provider  acetaminophen (TYLENOL) 500 MG tablet Take 500 mg by mouth every 8 (eight) hours as needed for mild pain or moderate pain.    [provider]  ALPRAZolam Duanne Moron) 0.25 MG tablet Take 0.25 mg by mouth 2 (two) times daily as needed for anxiety.    [provider]  atorvastatin (LIPITOR) 10 MG tablet Take 1 tablet (10 mg total) by mouth daily. 02/20/14   [name],  MD  cholecalciferol (VITAMIN D) 1000 UNITS tablet Take 1,000 Units by mouth every morning.     [provider]  citalopram (CELEXA) 10 MG tablet Take 1 tablet (10 mg total) by mouth daily. Patient taking differently: Take 20 mg by mouth daily.  04/20/14   Kathie Dike, MD  clopidogrel (PLAVIX) 75 MG tablet Take 75 mg by mouth daily.    [provider]  clotrimazole-betamethasone (LOTRISONE) cream Apply 1 application topically 2 (two) times daily as needed (Eczema).  10/26/16   [provider]  docusate sodium (COLACE) 100 MG capsule Take 100 mg by mouth 2 (two) times daily.    [provider]  feeding supplement, GLUCERNA SHAKE, (GLUCERNA SHAKE) LIQD Take 237 mLs by mouth daily as needed.     [provider]  furosemide (LASIX) 40 MG tablet Take 1.5 tablets (60 mg total) by mouth daily. Patient taking differently: Take 40 mg by mouth daily.  02/18/16   Minus Breeding, MD  Glycerin-Polysorbate 80 (REFRESH DRY EYE THERAPY OP) Place 1 drop into both eyes  2 (two) times daily as needed.    [provider]  hydrALAZINE (APRESOLINE) 25 MG tablet Take 1 tablet (25 mg total) by mouth 2 (two) times daily. 12/16/15   Imogene Burn, PA-C  insulin aspart (NOVOLOG FLEXPEN) 100 UNIT/ML FlexPen Inject 3 Units into the skin 3 (three) times daily with meals. When BG is over 100     [provider]  Insulin Glargine (LANTUS SOLOSTAR) 100 UNIT/ML Solostar Pen Inject 8 Units into the skin at bedtime.    [provider]  lidocaine (XYLOCAINE) 5 % ointment Apply 1 application topically as needed.    [provider]  loratadine (CLARITIN) 10 MG tablet Take 10 mg by mouth daily.    [provider]  Macitentan 10 MG TABS Take 10 mg by mouth daily.    [provider]  Methylfol-Algae-B12-Acetylcyst (CEREFOLIN NAC) 6-90.314-2-600 MG TABS Take 1 tablet by mouth daily.     [provider]  Methylfol-Methylcob-Acetylcyst  (CEREFOLIN NAC) 6-2-600 MG TABS Take by mouth daily.    [provider]  metoprolol (LOPRESSOR) 50 MG tablet Take 81m in the a.m.and 235min the pm 12/16/15   LeImogene BurnPA-C  mirtazapine (REMERON) 30 MG tablet Take 30 mg by mouth at bedtime.    [provider]  Multiple Vitamin (MULTIVITAMIN) tablet Take 1 tablet by mouth daily. Reported on 02/06/2016    [provider]  olmesartan (BENICAR) 20 MG tablet Take 5 mg by mouth daily.    [provider]  pantoprazole (PROTONIX) 40 MG tablet TAKE 1 TABLET TWICE A DAY BEFORE MEALS Patient taking differently: Take 40 mg by mouth daily.  12/23/17   GiCarlis StableNP  potassium chloride (K-DUR,KLOR-CON) 10 MEQ tablet Take 10 mEq by mouth daily.     [provider]  Probiotic Product (PROBIOTIC DAILY PO) Take 1 capsule by mouth daily.    [provider]  psyllium (METAMUCIL SMOOTH TEXTURE) 28 % packet Take 1 packet by mouth 2 (two) times daily as needed.     [provider]  thyroid (ARMOUR) 15 MG tablet Take 15 mg by mouth daily.    [provider]  umeclidinium bromide (INCRUSE ELLIPTA) 62.5 MCG/INH AEPB Inhale 1 puff into the lungs daily.    [provider]  URSO FORTE 500 MG tablet TAKE 1 TABLET TWICE A DAY WITH MEALS 10/05/17   BoAnnitta NeedsNP    Family History Family History  Problem Relation Age of Onset  . Heart failure Mother   . Kidney cancer Mother   . Cancer Mother   . Hypertension Mother   . Lung cancer Father        Brain METS  . Cancer Father   . Cirrhosis Brother   . Kidney cancer Sister   . Cancer Sister   . Hypertension Sister   . Heart attack Neg Hx   . Stroke Neg Hx     Social History Social History   Tobacco Use  . Smoking status: Former Smoker    Packs/day: 0.50    Years: 40.00    Pack years: 20.00    Types: Cigarettes    Last attempt to quit: 03/30/2013    Years since quitting: 5.3  . Smokeless tobacco: Never Used  . Tobacco  comment: Quit since 03/2013  Substance Use Topics  . Alcohol use: No    Alcohol/week: 0.0 standard drinks  . Drug use: No     Allergies  Aricept [donepezil hcl]; Namenda [memantine hcl]; Donepezil; Spironolactone; Codeine; Indocin [indomethacin]; Latex; and Penicillins   Review of Systems Review of Systems  Constitutional: Positive for activity change, appetite change and fatigue. Negative for fever.  HENT: Negative for congestion, facial swelling and trouble swallowing.   Eyes: Negative for photophobia and visual disturbance.  Respiratory: Negative for cough, chest tightness and shortness of breath.   Cardiovascular: Negative for chest pain.  Gastrointestinal: Negative for abdominal pain, nausea and vomiting.  Genitourinary: Negative for dysuria and hematuria.  Musculoskeletal: Positive for arthralgias and myalgias.  Neurological: Positive for weakness and light-headedness. Negative for dizziness.    all other systems are negative except as noted in the HPI and PMH.    Physical Exam Updated Vital Signs BP (!) 163/58   Pulse 66   Temp 100.1 F (37.8 C) (Oral)   Resp 18   Ht _0  (1.626 m)   Wt 67.4 kg   SpO2 90%   BMI 25.51 kg/m   Physical Exam  Constitutional: She is oriented to person, place, and time. She appears well-developed and well-nourished. No distress.  HENT:  Head: Normocephalic and atraumatic.  Mouth/Throat: Oropharynx is clear and moist. No oropharyngeal exudate.  Eyes: Pupils are equal, round, and reactive to light. Conjunctivae and EOM are normal.  IOP 16 OS, 17 OD  No fluorescein uptake in left eye.  Extraocular movements are intact. Visual fields full to confrontation.  Neck: Normal range of motion. Neck supple.  No meningismus.  Cardiovascular: Normal rate, regular rhythm, normal heart sounds and intact distal pulses.  No murmur heard. Pulmonary/Chest: Effort normal and breath sounds normal. No respiratory distress. She exhibits no tenderness.   Rhonchi bilaterally  Abdominal: Soft. There is no tenderness. There is no rebound and no guarding.  Musculoskeletal: Normal range of motion. She exhibits no edema or tenderness.  Neurological: She is alert and oriented to person, place, and time. No cranial nerve deficit. She exhibits normal muscle tone. Coordination normal.  No ataxia on finger to nose bilaterally. No pronator drift. 5/5 strength throughout. CN 2-12 intact.Equal grip strength. Sensation intact.   Skin: Skin is warm.  Psychiatric: She has a normal mood and affect. Her behavior is normal.  Nursing note and vitals reviewed.    ED Treatments / Results  Labs (all labs ordered are listed, but only abnormal results are displayed) Labs Reviewed  CBC WITH DIFFERENTIAL/PLATELET - Abnormal; Notable for the following components:      Result Value   WBC 3.9 (*)    RBC 3.55 (*)    Hemoglobin 11.8 (*)    HCT 34.8 (*)    All other components within normal limits  COMPREHENSIVE METABOLIC PANEL - Abnormal; Notable for the following components:   Sodium 133 (*)    BUN 25 (*)    Creatinine, Ser 1.08 (*)    GFR calc non Af Amer 45 (*)    GFR calc Af Amer 53 (*)    All other components within normal limits  BASIC METABOLIC PANEL - Abnormal; Notable for the following components:   Sodium 134 (*)    Potassium 3.4 (*)    Glucose, Bld 116 (*)    Calcium 8.5 (*)    GFR calc non Af Amer 54 (*)    All other components within normal limits  GLUCOSE, CAPILLARY - Abnormal; Notable for the following components:   Glucose-Capillary 111 (*)    All other components within normal limits  CULTURE, BLOOD (ROUTINE X 2)  CULTURE, BLOOD (ROUTINE X 2)  URINE CULTURE  EXPECTORATED SPUTUM ASSESSMENT W REFEX TO RESP CULTURE  GRAM STAIN  URINALYSIS, ROUTINE W REFLEX MICROSCOPIC  AMMONIA  MAGNESIUM  PHOSPHORUS  STREP PNEUMONIAE URINARY ANTIGEN  I-STAT CG4 LACTIC ACID, ED  I-STAT CG4 LACTIC ACID, ED  I-STAT CG4 LACTIC ACID, ED  I-STAT CG4  LACTIC ACID, ED    EKG EKG Interpretation  Date/Time:  Tuesday July 19 2018 00:50:56 EDT Ventricular Rate:  61 PR Interval:    QRS Duration: 93 QT Interval:  453 QTC Calculation: 457 R Axis:   70 Text Interpretation:  Sinus rhythm Prolonged PR interval Probable left atrial enlargement Abnormal R-wave progression, early transition Nonspecific T abnrm, anterolateral leads No significant change was found Confirmed by Ezequiel Essex [phone]) on 07/19/2018 1:31:47 AM   Radiology Dg Chest 2 View  Result Date: 07/19/2018 CLINICAL DATA:  84 year old female with cough. EXAM: CHEST - 2 VIEW COMPARISON:  Multiple prior chest radiographs dating back to 05/24/2018 FINDINGS: There is cardiomegaly with vascular congestion. Increased density at the right lung base may represent developing infiltrate. Overall interval progression of density at the right lung base since the prior radiographs. Clinical correlation is recommended. There is diffuse interstitial coarsening. No large pleural effusion, or pneumothorax. No acute osseous pathology. IMPRESSION: 1. Increased density at the right lung base concerning for the infiltrate. 2. Cardiomegaly with probable mild vascular congestion. Electronically Signed   By: Anner Crete M.D.   On: 07/19/2018 00:12    Procedures Procedures (including critical care time)  Medications Ordered in ED Medications  fluorescein ophthalmic strip 1 strip (1 strip Both Eyes Given 07/18/18 2345)  tetracaine (PONTOCAINE) 0.5 % ophthalmic solution 2 drop (2 drops Both Eyes Given 07/18/18 2345)     Initial Impression / Assessment and Plan / ED Course  I have reviewed the triage vital signs and the nursing notes.  Pertinent labs & imaging results that were available during my care of the patient were reviewed by me and considered in my medical decision making (see chart for details).    Patient with weakness, dizziness, lightheadedness and chills.  Has been treated for  pneumonia since August 27 to [place].  Also has had blurry vision the left eye for the past 4 days.  No distress. Labs reassuring. Lactate normal. No fever. CXR with R PNA persistent.   Antibiotics started after cultures obtained. IOP on L normal. No retinal detachment seen on [place]. Denies headache or eye pain.  Daughter feels all symptoms are due to levaquin.  Plan admission for hydration, IV antibiotics, seems to have failed outpatient treatment.   Admission d/w Dr. Olevia Bowens.  May need ophthalmology consult or CNS imaging if symptoms persist.  EMERGENCY DEPARTMENT [place] OCULAR EXAM "Study: Limited Ultrasound of Orbit "  INDICATIONS: Floaters/Flashes Linear probe utilized to obtain images in both long and short axis of the orbit having the patient look left and right if possible.  PERFORMED BY: Myself IMAGES ARCHIVED?: Yes LIMITATIONS: none VIEWS USED: Left orbit INTERPRETATION: No retinal detachment, Lens in proper position    Final Clinical Impressions(s) / ED Diagnoses   Final diagnoses:  Community acquired pneumonia of right lower lobe of lung [facility])  Generalized weakness    ED Discharge Orders    None       Ezequiel Essex, MD 07/19/18 [phone]

## 2018-07-19 NOTE — Progress Notes (Signed)
PROGRESS NOTE    [name]  ZOX:[id number] DOB: 30-Aug-1931 DOA: 07/18/2018 PCP: [name], MD    Brief Narrative:   [name] is a 84 y.o. female with medical history significant of history of acute delirium, chronic diastolic CHF, anxiety, depression, carotid artery disease, CAD, essential hypertension, chronic headaches, chronic liver disease, history of right hip fracture, history of breast cancer, history of GI bleed, hyperlipidemia, history of orthostatic hypotension, diabetic polyneuropathy, osteopenia, parotid gland tumor, history of pneumonia, PSVT, syncope and collapse, type 2 diabetes mellitus who is coming to the emergency department with complaints of fever, chills, dyspnea and weakness for several days despite taking Levaquin since Tuesday the last week.   Assessment & Plan:   Principal Problem:   CAP (community acquired pneumonia) Active Problems:   Type 2 diabetes mellitus ([place])   Hyperlipidemia   Depression   Essential hypertension   Diastolic CHF, chronic (HCC)   Hypothyroid   Right lower lobe pneumonia:  Admitted for IV antibiotics, on rocephin and zithromax, continue with the same.  Follow up blood cultures, urine for strep pneumonia pending.  Sputum cultures pending.  Low grade temp this am, wbc count wnl.     Type 2 DM: CBG (last 3)  Recent Labs    07/19/18 0751 07/19/18 1139  GLUCAP 111* 150*   Resume SSI.    Hypokalemia: replaced.    Mild acute on chronic diastolic heart failure:  CXR mild vascular congestion.  Started him IV lasix 40 mg daily.  Strict intake and output.     Hypertension; well controlled.    Hypothyroidism: Resume synthroid.    Hyperlipidemia:  Resume lipitor.    Blurry vision:  Pt reports improvement.  Continue to monitor.    Depression:  Resume celexa.    DVT prophylaxis: lovenox.  Code Status: full code.  Family Communication: discussed with family at bedside.  Disposition Plan: pending  further evaluation.    Consultants:  None.   Procedures: none.  Antimicrobials: rocephin and zithromax since admission.    Subjective: Breathing slightly better.   Objective: Vitals:   07/19/18 0554 07/19/18 0644 07/19/18 1355 07/19/18 1425  BP: (!) 182/71 (!) 152/60 (!) 138/46   Pulse: 79 65 (!) 59   Resp:   18   Temp: 99.9 F (37.7 C)  98.2 F (36.8 C)   TempSrc: Oral  Oral   SpO2: 90% 92% 97% 93%  Weight:      Height:        Intake/Output Summary (Last 24 hours) at 07/19/2018 1426 Last data filed at 07/19/2018 0911 Gross per 24 hour  Intake 1090 ml  Output -  Net 1090 ml   Filed Weights   07/18/18 2304  Weight: 67.4 kg    Examination:  General exam: Appears calm and comfortable on 2l it of [place] oxygen.  Respiratory system: Clear to auscultation. Respiratory effort normal. Cardiovascular system: S1 & S2 heard, RRR. No JVD, murmurs, Gastrointestinal system: Abdomen is nondistended, soft and nontender. No organomegaly or masses felt. Normal bowel sounds heard. Central nervous system: Alert and oriented to place and person only  Extremities: Symmetric 5 x 5 power. Skin: No rashes, lesions or ulcers Psychiatry: Mood & affect appropriate.     Data Reviewed: I have personally reviewed following labs and imaging studies  CBC: Recent Labs  Lab 07/18/18 2336  WBC 3.9*  NEUTROABS 2.0  HGB 11.8*  HCT 34.8*  MCV 98.0  PLT 811   Basic Metabolic Panel: Recent  Labs  Lab 07/18/18 2336 07/19/18 0213 07/19/18 0511  NA 133*  --  134*  K 3.5  --  3.4*  CL 98  --  101  CO2 26  --  25  GLUCOSE 99  --  116*  BUN 25*  --  21  CREATININE 1.08*  --  0.94  CALCIUM 9.0  --  8.5*  MG  --  1.7  --   PHOS  --  3.3  --    GFR: Estimated Creatinine Clearance: 41.3 mL/min (by C-G formula based on SCr of 0.94 mg/dL). Liver Function Tests: Recent Labs  Lab 07/18/18 2336  AST 20  ALT 11  ALKPHOS 81  BILITOT 0.7  PROT 7.1  ALBUMIN 3.5   No results for input(s):  LIPASE, AMYLASE in the last 168 hours. Recent Labs  Lab 07/19/18 0213  AMMONIA 20   Coagulation Profile: No results for input(s): INR, PROTIME in the last 168 hours. Cardiac Enzymes: No results for input(s): CKTOTAL, CKMB, CKMBINDEX, TROPONINI in the last 168 hours. BNP (last 3 results) No results for input(s): PROBNP in the last 8760 hours. HbA1C: No results for input(s): HGBA1C in the last 72 hours. CBG: Recent Labs  Lab 07/19/18 0751 07/19/18 1139  GLUCAP 111* 150*   Lipid Profile: No results for input(s): CHOL, HDL, LDLCALC, TRIG, CHOLHDL, LDLDIRECT in the last 72 hours. Thyroid Function Tests: No results for input(s): TSH, T4TOTAL, FREET4, T3FREE, THYROIDAB in the last 72 hours. Anemia Panel: No results for input(s): VITAMINB12, FOLATE, FERRITIN, TIBC, IRON, RETICCTPCT in the last 72 hours. Sepsis Labs: Recent Labs  Lab 07/19/18 0002 07/19/18 0200  LATICACIDVEN 0.66 0.78    Recent Results (from the past 240 hour(s))  Blood culture (routine x 2)     Status: None (Preliminary result)   Collection Time: 07/18/18 11:36 PM  Result Value Ref Range Status   Specimen Description BLOOD RIGHT ARM  Final   Special Requests   Final    BOTTLES DRAWN AEROBIC AND ANAEROBIC Blood Culture adequate volume   Culture   Final    NO GROWTH < 12 HOURS Performed at [facility], [address] [place], [place] [address]    Report Status PENDING  Incomplete  Blood culture (routine x 2)     Status: None (Preliminary result)   Collection Time: 07/19/18  1:07 AM  Result Value Ref Range Status   Specimen Description BLOOD RIGHT HAND  Final   Special Requests   Final    BOTTLES DRAWN AEROBIC AND ANAEROBIC Blood Culture adequate volume   Culture   Final    NO GROWTH < 12 HOURS Performed at [facility], [address] [place], [place] [address]    Report Status PENDING  Incomplete         Radiology Studies: Dg Chest 2 View  Result Date: 07/19/2018 CLINICAL DATA:  84 year old  female with cough. EXAM: CHEST - 2 VIEW COMPARISON:  Multiple prior chest radiographs dating back to 05/24/2018 FINDINGS: There is cardiomegaly with vascular congestion. Increased density at the right lung base may represent developing infiltrate. Overall interval progression of density at the right lung base since the prior radiographs. Clinical correlation is recommended. There is diffuse interstitial coarsening. No large pleural effusion, or pneumothorax. No acute osseous pathology. IMPRESSION: 1. Increased density at the right lung base concerning for the infiltrate. 2. Cardiomegaly with probable mild vascular congestion. Electronically Signed   By: Anner Crete M.D.   On: 07/19/2018 00:12  Scheduled Meds: . atorvastatin  10 mg Oral Daily  . CEREFOLIN NAC  1 tablet Oral Daily  . cholecalciferol  1,000 Units Oral q morning - 10a  . citalopram  10 mg Oral Daily  . clopidogrel  75 mg Oral Daily  . dextromethorphan-guaiFENesin  1 tablet Oral BID  . docusate sodium  100 mg Oral BID  . enoxaparin (LOVENOX) injection  40 mg Subcutaneous Q24H  . feeding supplement (ENSURE ENLIVE)  237 mL Oral BID BM  . feeding supplement (GLUCERNA SHAKE)  237 mL Oral BID BM  . [START ON 07/20/2018] furosemide  40 mg Oral Daily  . hydrALAZINE  25 mg Oral BID  . insulin aspart  3 Units Subcutaneous TID WC  . insulin glargine  8 Units Subcutaneous QHS  . ipratropium-albuterol  3 mL Nebulization Q6H  . irbesartan  150 mg Oral Daily  . loratadine  10 mg Oral Daily  . macitentan  10 mg Oral Daily  . mouth rinse  15 mL Mouth Rinse BID  . metoprolol tartrate  37.5 mg Oral BID  . mirtazapine  30 mg Oral QHS  . multivitamin with minerals  1 tablet Oral Daily  . pantoprazole  40 mg Oral Daily  . potassium chloride  10 mEq Oral Daily  . potassium chloride  20 mEq Oral Once  . thyroid  15 mg Oral Q0600  . ursodiol  500 mg Oral BID WC   Continuous Infusions: . [START ON 07/20/2018] azithromycin    . [START  ON 07/20/2018] cefTRIAXone (ROCEPHIN)  IV    . magnesium sulfate 1 - 4 g bolus IVPB       LOS: 0 days    Time spent: 35 minutes.     Hosie Poisson, MD [facility] Pager [phone]  If 7PM-7AM, please contact night-coverage [email or web address] Password [email or web address] 07/19/2018, 2:26 PM

## 2018-07-19 NOTE — Progress Notes (Addendum)
Nutrition Brief Note  Patient identified on the Malnutrition Screening Tool (MST) Report  Patient is an 84 yo female who presents with pneumonia. No discernable weight change noted with this acute illness.   Wt Readings from Last 15 Encounters:  07/18/18 67.4 kg  07/06/18 67.5 kg  11/03/17 67 kg  04/29/17 65.8 kg  04/28/17 66 kg  04/13/17 68 kg  12/22/16 69.9 kg  07/30/16 71.1 kg  05/17/16 71.2 kg  04/10/16 72.3 kg  02/06/16 72.3 kg  02/04/16 71.7 kg  01/20/16 73.8 kg  12/24/15 74.1 kg  12/16/15 75.8 kg    Body mass index is 25.51 kg/m. Patient meets criteria for overweight based on current BMI.   Her appetite has been diminished the past few days prior to admission but ate well this morning and says she is feeling better. Current diet order is Heart Healthy, patient is consuming approximately 75% of meals at this time. Feeds herself. At home she follows a [place] and wears dentures. She does not have them with her. Encouraged pt to order soft foods. Patient also drinks Glucerna at home daily in the afternoon. Will order for her while she is here. She is accustomed to eating 3 meals daily.    Labs and medications reviewed.  BMP Latest Ref Rng & Units 07/19/2018 07/18/2018 11/01/2017  Glucose 70 - 99 mg/dL 116(H) 99 129  BUN 8 - 23 mg/dL 21 25(H) 16  Creatinine 0.44 - 1.00 mg/dL 0.94 1.08(H) 0.81  BUN/Creat Ratio 6 - 22 (calc) - - NOT APPLICABLE  Sodium 629 - 145 mmol/L 134(L) 133(L) 139  Potassium 3.5 - 5.1 mmol/L 3.4(L) 3.5 4.2  Chloride 98 - 111 mmol/L 101 98 101  CO2 22 - 32 mmol/L 25 26 32  Calcium 8.9 - 10.3 mg/dL 8.5(L) 9.0 9.3     No nutrition interventions warranted at this time. If nutrition issues arise, please consult RD.   Colman Cater MS,RD,CSG,LDN Office: [phone] Pager: [phone]

## 2018-07-19 NOTE — H&P (Signed)
History and Physical    [name]:[id number] DOB: 07-05-1931 DOA: 07/18/2018  PCP: [name], MD   Patient coming from: Home.  I have personally briefly reviewed patient's old medical records in [place]  Chief Complaint: Fever, chills and weakness.  HPI: [name] is a 84 y.o. female with medical history significant of history of acute delirium, chronic diastolic CHF, anxiety, depression, carotid artery disease, CAD, essential hypertension, chronic headaches, chronic liver disease, history of right hip fracture, history of breast cancer, history of GI bleed, hyperlipidemia, history of orthostatic hypotension, diabetic polyneuropathy, osteopenia, parotid gland tumor, history of pneumonia, PSVT, syncope and collapse, type 2 diabetes mellitus who is coming to the emergency department with complaints of fever, chills, dyspnea and weakness for several days despite taking Levaquin since Tuesday the last week.  Positive headache, fatigue and malaise, but no sore throat or rhinorrhea.  She denies chest pain, palpitations, dizziness, diaphoresis, PND, orthopnea.  No abdominal pain, nausea, emesis, diarrhea,  melena or hematochezia.  No dysuria, frequency or hematuria.  Denies polyuria, polydipsia.  She has been complaining of blurred vision since she started taking Levaquin.  ED Course: Initial vital signs temperature 100.1 F, pulse 66, respirations 18, blood pressure 163/58 mmHg and O2 sat 90% on nasal cannula oxygen.  Blood cultures were drawn.  She was given supplemental oxygen, bronchodilators, ceftriaxone and Zithromax.  Her lab work White count 3.9 with a normal differential, hemoglobin 11.8 g/dL and platelets 222.  Her lactic acid was normal at 0.66 and 0.78 mmol/L.  CMP shows a sodium 133, potassium 3.5, chloride 98, CO2 26 mmol/L, creatinine 1.08 and BUN of 25 mg/dL.  Her LFTs are within normal limits.  Ammonia level was 20 mol/L.  Her chest radiograph shows RLL infiltrate.   Please see images and full radiology report for further detail.  Review of Systems: As per HPI otherwise 10 point review of systems negative.  Past Medical History:  Diagnosis Date  . Acute delirium 06/23/2013  . Acute diastolic congestive heart failure ([place]) 03/21/2014  . Anxiety   . Cancer ([place])   . Carotid artery disease (HCC)    Bilateral ICA occlusion  . Chronic headaches   . Chronic liver disease    ?cirrhosis on 10/2014 CT. H/O elevated alkphos and +AMA on URSO.  Marland Kitchen Coronary atherosclerosis of native coronary artery    Mild nonobstructive disease at cardiac catheterization May 2011  . Depression   . Essential hypertension, benign   . Fracture of femoral neck, right, closed ([place]) 03/28/2014  . History of breast cancer   . History of GI bleed   . Hyperlipidemia   . Orthostatic hypotension    Diabetic polyneuropathy/diabetic dysautonomia   . Osteopenia   . Parotid tumor   . Pneumonia 11/2016  . Pressure ulcer, heel, right, unstageable ([place]) 04/17/2014  . PSVT (paroxysmal supraventricular tachycardia) ([place])   . Syncope and collapse 04/15/2010   Qualifier: Diagnosis of  By: Angelena Form, MD, Harrell Gave    . Type 2 diabetes mellitus ([place])   . UPPER GASTROINTESTINAL HEMORRHAGE 03/21/2007   Qualifier: Diagnosis of  By: Jonna Munro MD, Roderic Scarce      Past Surgical History:  Procedure Laterality Date  . ABDOMINAL HYSTERECTOMY    . APPENDECTOMY    . CHOLECYSTECTOMY  2008  . COLONOSCOPY  2010   Dr. Oneida Alar: single tubular adenoma, one hyperplastic polyp. Next TCS 2020 health permitting.  . ESOPHAGOGASTRODUODENOSCOPY N/A 08/17/2016   Procedure: ESOPHAGOGASTRODUODENOSCOPY (EGD);  Surgeon: Marga Melnick  Fields, MD;  [facility];  Service: Endoscopy;  Laterality: N/A;  8:45 am  . ESOPHAGOGASTRODUODENOSCOPY N/A 01/01/2017   Procedure: ESOPHAGOGASTRODUODENOSCOPY (EGD);  Surgeon: Danie Binder, MD;  Location: [facility];  Service: Endoscopy;  Laterality: N/A;  9:15 am  . HEMORRHOID SURGERY   1960s  . HIP ARTHROPLASTY Right 03/28/2014   Procedure: ARTHROPLASTY BIPOLAR HIP;  Surgeon: Carole Civil, MD;  Location: [facility];  Service: Orthopedics;  Laterality: Right;  . LAPAROSCOPIC APPENDECTOMY N/A 04/13/2013   Procedure: APPENDECTOMY LAPAROSCOPIC;  Surgeon: Donato Heinz, MD;  Location: [facility];  Service: General;  Laterality: N/A;  . MASTECTOMY  1994   Left breast -related to breast cancer  . RIGHT HEART CATH N/A 04/29/2017   Procedure: Right Heart Cath;  Surgeon: Adrian Prows, MD;  Location: [facility];  Service: Cardiovascular;  Laterality: N/A;  . RIGHT/LEFT HEART CATH AND CORONARY ANGIOGRAPHY N/A 04/13/2017   Procedure: RIGHT/LEFT HEART CATH AND CORONARY ANGIOGRAPHY;  Surgeon: Adrian Prows, MD;  Location: [facility];  Service: Cardiovascular;  Laterality: N/A;  . SAVORY DILATION N/A 08/17/2016   Procedure: SAVORY DILATION;  Surgeon: Danie Binder, MD;  [facility];  Service: Endoscopy;  Laterality: N/A;  . VESICOVAGINAL FISTULA CLOSURE W/ TAH  1979     reports that she quit smoking about 5 years ago. Her smoking use included cigarettes. She has a 20.00 pack-year smoking history. She has never used smokeless tobacco. She reports that she does not drink alcohol or use drugs.  Allergies  Allergen Reactions  . Aricept [Donepezil Hcl]     Hives, vomiting and headache  . Namenda [Memantine Hcl]     hives  . Donepezil Nausea Only  . Spironolactone Other (See Comments)    Dizziness, balance problems  . Codeine Rash  . Indocin [Indomethacin] Rash  . Latex Rash  . Penicillins Rash    Family History  Problem Relation Age of Onset  . Heart failure Mother   . Kidney cancer Mother   . Cancer Mother   . Hypertension Mother   . Lung cancer Father        Brain METS  . Cancer Father   . Cirrhosis Brother   . Kidney cancer Sister   . Cancer Sister   . Hypertension Sister   . Heart attack Neg Hx   . Stroke Neg Hx     Prior to Admission medications    Medication Sig Start Date End Date Taking? Authorizing Provider  acetaminophen (TYLENOL) 500 MG tablet Take 500 mg by mouth every 8 (eight) hours as needed for mild pain or moderate pain.    [provider]  ALPRAZolam Duanne Moron) 0.25 MG tablet Take 0.25 mg by mouth 2 (two) times daily as needed for anxiety.    [provider]  atorvastatin (LIPITOR) 10 MG tablet Take 1 tablet (10 mg total) by mouth daily. 02/20/14   [name], MD  cholecalciferol (VITAMIN D) 1000 UNITS tablet Take 1,000 Units by mouth every morning.     [provider]  citalopram (CELEXA) 10 MG tablet Take 1 tablet (10 mg total) by mouth daily. Patient taking differently: Take 20 mg by mouth daily.  04/20/14   Kathie Dike, MD  clopidogrel (PLAVIX) 75 MG tablet Take 75 mg by mouth daily.    [provider]  clotrimazole-betamethasone (LOTRISONE) cream Apply 1 application topically 2 (two) times daily as needed (Eczema).  10/26/16   [provider]  docusate sodium (COLACE) 100 MG capsule Take 100 mg by mouth 2 (two) times daily.    [provider]  feeding supplement, GLUCERNA SHAKE, (GLUCERNA SHAKE) LIQD Take 237 mLs by mouth daily as needed.     [provider]  furosemide (LASIX) 40 MG tablet Take 1.5 tablets (60 mg total) by mouth daily. Patient taking differently: Take 40 mg by mouth daily.  02/18/16   Minus Breeding, MD  Glycerin-Polysorbate 80 (REFRESH DRY EYE THERAPY OP) Place 1 drop into both eyes 2 (two) times daily as needed.    [provider]  hydrALAZINE (APRESOLINE) 25 MG tablet Take 1 tablet (25 mg total) by mouth 2 (two) times daily. 12/16/15   Imogene Burn, PA-C  insulin aspart (NOVOLOG FLEXPEN) 100 UNIT/ML FlexPen Inject 3 Units into the skin 3 (three) times daily with meals. When BG is over 100     [provider]  Insulin Glargine (LANTUS SOLOSTAR) 100 UNIT/ML Solostar Pen Inject 8 Units into the skin at bedtime.     [provider]  lidocaine (XYLOCAINE) 5 % ointment Apply 1 application topically as needed.    [provider]  loratadine (CLARITIN) 10 MG tablet Take 10 mg by mouth daily.    [provider]  Macitentan 10 MG TABS Take 10 mg by mouth daily.    [provider]  Methylfol-Algae-B12-Acetylcyst (CEREFOLIN NAC) 6-90.314-2-600 MG TABS Take 1 tablet by mouth daily.     [provider]  Methylfol-Methylcob-Acetylcyst (CEREFOLIN NAC) 6-2-600 MG TABS Take by mouth daily.    [provider]  metoprolol (LOPRESSOR) 50 MG tablet Take 72m in the a.m.and 270min the pm 12/16/15   LeImogene BurnPA-C  mirtazapine (REMERON) 30 MG tablet Take 30 mg by mouth at bedtime.    [provider]  Multiple Vitamin (MULTIVITAMIN) tablet Take 1 tablet by mouth daily. Reported on 02/06/2016    [provider]  olmesartan (BENICAR) 20 MG tablet Take 5 mg by mouth daily.    [provider]  pantoprazole (PROTONIX) 40 MG tablet TAKE 1 TABLET TWICE A DAY BEFORE MEALS Patient taking differently: Take 40 mg by mouth daily.  12/23/17   GiCarlis StableNP  potassium chloride (K-DUR,KLOR-CON) 10 MEQ tablet Take 10 mEq by mouth daily.     [provider]  Probiotic Product (PROBIOTIC DAILY PO) Take 1 capsule by mouth daily.    [provider]  psyllium (METAMUCIL SMOOTH TEXTURE) 28 % packet Take 1 packet by mouth 2 (two) times daily as needed.     [provider]  thyroid (ARMOUR) 15 MG tablet Take 15 mg by mouth daily.    [provider]  umeclidinium bromide (INCRUSE ELLIPTA) 62.5 MCG/INH AEPB Inhale 1 puff into the lungs daily.    [provider]  URSO FORTE 500 MG tablet TAKE 1 TABLET TWICE A DAY WITH MEALS 10/05/17   BoAnnitta NeedsNP    Physical Exam: Vitals:   07/19/18 0139 07/19/18 0200 07/19/18 0300 07/19/18 0330  BP: (!) 150/61 (!) 162/60 (!) 152/53 (!) 123/40  Pulse: 74 63 64 66  Resp: (!) _0 (!) 21  Temp:      TempSrc:      SpO2: (!) 85% 93% 92% 92%  Weight:      Height:        Constitutional: NAD, calm, comfortable Eyes: PERRL, lids and conjunctivae normal ENMT: Mucous membranes are moist. Posterior pharynx clear of  any exudate or lesions. Neck: normal, supple, no masses, no thyromegaly Respiratory: Decreased breath sounds on right lower lobe field with bilateral rhonchi and wheezing, no crackles. Normal respiratory effort. No accessory muscle use.  Cardiovascular: Regular rate and rhythm, no murmurs / rubs / gallops. No extremity edema. 2+ pedal pulses. No carotid bruits.  Abdomen: Soft, no tenderness, no masses palpated. No hepatosplenomegaly. Bowel sounds positive.  Musculoskeletal: no clubbing / cyanosis. Good ROM, no contractures. Normal muscle tone.  Skin: no rashes, lesions, ulcers on limited dermatological examination. Neurologic: CN 2-12 grossly intact. Sensation intact, DTR normal. Strength 5/5 in all 4.  Psychiatric: Normal judgment and insight. Alert and oriented x 3. Normal mood.    Labs on Admission: I have personally reviewed following labs and imaging studies  CBC: Recent Labs  Lab 07/18/18 2336  WBC 3.9*  NEUTROABS 2.0  HGB 11.8*  HCT 34.8*  MCV 98.0  PLT 601   Basic Metabolic Panel: Recent Labs  Lab 07/18/18 2336  NA 133*  K 3.5  CL 98  CO2 26  GLUCOSE 99  BUN 25*  CREATININE 1.08*  CALCIUM 9.0   GFR: Estimated Creatinine Clearance: 36 mL/min (A) (by C-G formula based on SCr of 1.08 mg/dL (H)). Liver Function Tests: Recent Labs  Lab 07/18/18 2336  AST 20  ALT 11  ALKPHOS 81  BILITOT 0.7  PROT 7.1  ALBUMIN 3.5   No results for input(s): LIPASE, AMYLASE in the last 168 hours. Recent Labs  Lab 07/19/18 0213  AMMONIA 20   Coagulation Profile: No results for input(s): INR, PROTIME in the last 168 hours. Cardiac Enzymes: No results for input(s): CKTOTAL, CKMB, CKMBINDEX, TROPONINI in the last 168 hours. BNP (last 3  results) No results for input(s): PROBNP in the last 8760 hours. HbA1C: No results for input(s): HGBA1C in the last 72 hours. CBG: No results for input(s): GLUCAP in the last 168 hours. Lipid Profile: No results for input(s): CHOL, HDL, LDLCALC, TRIG, CHOLHDL, LDLDIRECT in the last 72 hours. Thyroid Function Tests: No results for input(s): TSH, T4TOTAL, FREET4, T3FREE, THYROIDAB in the last 72 hours. Anemia Panel: No results for input(s): VITAMINB12, FOLATE, FERRITIN, TIBC, IRON, RETICCTPCT in the last 72 hours. Urine analysis:    Component Value Date/Time   COLORURINE STRAW (A) 01/02/2016 1100   APPEARANCEUR CLEAR 01/02/2016 1100   LABSPEC <1.005 (L) 01/02/2016 1100   PHURINE 5.5 01/02/2016 1100   GLUCOSEU NEGATIVE 01/02/2016 1100   HGBUR NEGATIVE 01/02/2016 1100   HGBUR small 08/13/2008 0925   BILIRUBINUR NEGATIVE 01/02/2016 1100   KETONESUR NEGATIVE 01/02/2016 1100   PROTEINUR NEGATIVE 01/02/2016 1100   UROBILINOGEN 0.2 06/11/2015 1301   NITRITE NEGATIVE 01/02/2016 1100   LEUKOCYTESUR NEGATIVE 01/02/2016 1100    Radiological Exams on Admission: Dg Chest 2 View  Result Date: 07/19/2018 CLINICAL DATA:  84 year old female with cough. EXAM: CHEST - 2 VIEW COMPARISON:  Multiple prior chest radiographs dating back to 05/24/2018 FINDINGS: There is cardiomegaly with vascular congestion. Increased density at the right lung base may represent developing infiltrate. Overall interval progression of density at the right lung base since the prior radiographs. Clinical correlation is recommended. There is diffuse interstitial coarsening. No large pleural effusion, or pneumothorax. No acute osseous pathology. IMPRESSION: 1. Increased density at the right lung base concerning for the infiltrate. 2. Cardiomegaly with probable mild vascular congestion. Electronically Signed   By: Anner Crete M.D.   On: 07/19/2018 00:12    EKG: Independently reviewed.  Vent. rate 61  BPM PR interval * ms QRS  duration 93 ms QT/QTc 453/457 ms P-R-T axes 82 70 68 Sinus rhythm Prolonged PR interval Probable left atrial enlargement Abnormal R-wave progression, early transition Nonspecific T abnrm, anterolateral leads  Assessment/Plan Principal Problem:   CAP (community acquired pneumonia) Admit to MedSurg/inpatient. Continue supplemental oxygen. Bronchodilators as needed. Continue ceftriaxone and Zithromax. Follow-up blood culture and sensitivity. Check strep pneumonia urinary antigen. Check sputum Gram stain, culture and sensitivity.  Active Problems:   Type 2 diabetes mellitus (HCC) Carbohydrate modified diet. Continue Lantus 8 units SQ at bedtime. Continue NovoLog 3 units SQ before meals. CBG monitoring before meals and at bedtime.    Hyperlipidemia Continue atorvastatin 10 mg p.o. daily. Monitor LFTs as needed. Follow-up lipid profile as an outpatient.    Depression Continue Remeron 30 mg p.o. at bedtime. Continue Celexa 10 mg p.o. daily. Alprazolam as needed for anxiety.    Essential hypertension Avapro 300 mg p.o. daily while in the hospital. Continue hydralazine 25 mg p.o. twice daily. Continue metoprolol 37.5 mg p.o. twice daily. Monitor blood pressure, heart rate, renal function electrolytes.    Diastolic CHF, chronic (HCC) No signs of decompensation. Continue metoprolol and Avapro. Resume furosemide tomorrow.    Hypothyroid Continue Armour Thyroid 50 mg p.o. daily. Check TSH as needed.    Blurred vision  This happened since she was taking Levaquin. Eye exam done by Dr. Kingsley Callander showed normal eye pressure and no fluorescein uptake. Continue to monitor for symptoms.  Consider ophthalmology consult if it persists.   DVT prophylaxis: Lovenox SQ. Code Status: Full code. Family Communication: Her daughter [name] was present in the ED room and provided most of the history. Disposition Plan: Admit for IV antibiotics for several days. Consults called: Admission  status: Inpatient/MedSurg.   Reubin Milan MD [facility] Pager [phone].  If 7PM-7AM, please contact night-coverage [email or web address] Password [email or web address]  07/19/2018, 4:17 AM

## 2018-07-20 DIAGNOSIS — E1165 Type 2 diabetes mellitus with hyperglycemia: Secondary | ICD-10-CM

## 2018-07-20 DIAGNOSIS — J181 Lobar pneumonia, unspecified organism: Principal | ICD-10-CM

## 2018-07-20 DIAGNOSIS — J9621 Acute and chronic respiratory failure with hypoxia: Secondary | ICD-10-CM

## 2018-07-20 DIAGNOSIS — E038 Other specified hypothyroidism: Secondary | ICD-10-CM

## 2018-07-20 DIAGNOSIS — E785 Hyperlipidemia, unspecified: Secondary | ICD-10-CM

## 2018-07-20 DIAGNOSIS — F329 Major depressive disorder, single episode, unspecified: Secondary | ICD-10-CM

## 2018-07-20 DIAGNOSIS — I5032 Chronic diastolic (congestive) heart failure: Secondary | ICD-10-CM

## 2018-07-20 DIAGNOSIS — I10 Essential (primary) hypertension: Secondary | ICD-10-CM

## 2018-07-20 DIAGNOSIS — Z794 Long term (current) use of insulin: Secondary | ICD-10-CM

## 2018-07-20 LAB — CBC WITH DIFFERENTIAL/PLATELET
Basophils Absolute: 0.1 10*3/uL (ref 0.0–0.1)
Basophils Relative: 2 %
Eosinophils Absolute: 0.2 10*3/uL (ref 0.0–0.7)
Eosinophils Relative: 5 %
HCT: 34 % — ABNORMAL LOW (ref 36.0–46.0)
Hemoglobin: 11.2 g/dL — ABNORMAL LOW (ref 12.0–15.0)
Lymphocytes Relative: 30 %
Lymphs Abs: 1.2 10*3/uL (ref 0.7–4.0)
MCH: 32.7 pg (ref 26.0–34.0)
MCHC: 32.9 g/dL (ref 30.0–36.0)
MCV: 99.4 fL (ref 78.0–100.0)
Monocytes Absolute: 0.5 10*3/uL (ref 0.1–1.0)
Monocytes Relative: 11 %
Neutro Abs: 2.1 10*3/uL (ref 1.7–7.7)
Neutrophils Relative %: 52 %
Platelets: 196 10*3/uL (ref 150–400)
RBC: 3.42 MIL/uL — ABNORMAL LOW (ref 3.87–5.11)
RDW: 13.7 % (ref 11.5–15.5)
WBC: 4 10*3/uL (ref 4.0–10.5)

## 2018-07-20 LAB — BASIC METABOLIC PANEL
Anion gap: 6 (ref 5–15)
BUN: 12 mg/dL (ref 8–23)
CO2: 28 mmol/L (ref 22–32)
Calcium: 8.7 mg/dL — ABNORMAL LOW (ref 8.9–10.3)
Chloride: 103 mmol/L (ref 98–111)
Creatinine, Ser: 0.63 mg/dL (ref 0.44–1.00)
GFR calc Af Amer: >60 mL/min (ref >60)
GFR calc non Af Amer: >60 mL/min (ref >60)
Glucose, Bld: 118 mg/dL — ABNORMAL HIGH (ref 70–99)
Potassium: 3.6 mmol/L (ref 3.5–5.1)
Sodium: 137 mmol/L (ref 135–145)

## 2018-07-20 LAB — GLUCOSE, CAPILLARY
Glucose-Capillary: 116 mg/dL — ABNORMAL HIGH (ref 70–99)
Glucose-Capillary: 195 mg/dL — ABNORMAL HIGH (ref 70–99)
Glucose-Capillary: 83 mg/dL (ref 70–99)

## 2018-07-20 MED ORDER — METOPROLOL TARTRATE 50 MG PO TABS
50.0000 mg | ORAL_TABLET | Freq: Every day | ORAL | Status: DC
  Administered 2018-07-20: 50 mg via ORAL
  Filled 2018-07-20: qty 1

## 2018-07-20 MED ORDER — METOPROLOL TARTRATE 25 MG PO TABS: 25.0000 mg | ORAL_TABLET | Freq: Every day | ORAL | Status: DC

## 2018-07-20 MED ORDER — DOXYCYCLINE HYCLATE 100 MG PO TABS: 100.0000 mg | ORAL_TABLET | Freq: Two times a day (BID) | ORAL | 0 refills | Status: AC

## 2018-07-20 MED ORDER — SACCHAROMYCES BOULARDII 250 MG PO CAPS: 250.0000 mg | ORAL_CAPSULE | Freq: Two times a day (BID) | ORAL | 0 refills | Status: DC

## 2018-07-20 MED ORDER — GUAIFENESIN ER 600 MG PO TB12: 600.0000 mg | ORAL_TABLET | Freq: Two times a day (BID) | ORAL | 0 refills | Status: AC | PRN

## 2018-07-20 NOTE — Care Management (Addendum)
Admitted with CAP. From home, has strong family support, lives with daughter and SIL. ind with ADL's. Has rollator she uses outside of home. PT recommends SNF. Pt does not have 3 night stay and understands medicare will not pay. Okay with HH, no preference of provider. Aware HH has 48 hrs to make first visit. Vaughan Basta, [facility] rep, given referral. Daughter at bedside for DC planning.

## 2018-07-20 NOTE — Discharge Planning (Signed)
Patient IV removed.  RN assessment and VS revealed stability for DC to home with [facility].  Discharge papers given, explained and educated.  Informed of suggested FU appt. Scripts printed and given.  Once ready, will be wheeled to front and family transporting home via car.

## 2018-07-20 NOTE — Progress Notes (Signed)
Patient blood pressure was 113/48 prior to that blood pressure had been running softer. md notified. md placed order to hold blood pressure medication for this shift.

## 2018-07-20 NOTE — Discharge Summary (Signed)
Physician Discharge Summary  [name]:[id number] DOB: [birth year] DOA: 07/18/2018  PCP: [name], MD  Admit date: 07/18/2018 Discharge date: 07/20/2018  Time spent: 35 minutes  Recommendations for Outpatient Follow-up:  1. Repeat basic metabolic panel to follow electrolytes and renal function 2. In 6 weeks repeat chest x-ray 8 to assure complete resolution of lungs infiltrates. 3. Follow CBGs and further adjust hypoglycemic regimen as needed.   Discharge Diagnoses:  Principal Problem:   CAP (community acquired pneumonia) Active Problems:   Type 2 diabetes mellitus ([place])   Hyperlipidemia   Depression   Essential hypertension   Diastolic CHF, chronic ([facility])   Hypothyroid   Acute on chronic respiratory failure with hypoxia ([place])   Discharge Condition: Stable and improved.  Patient discharged home with oral antibiotic therapy and instructions to follow-up with PCP in 10 days.  Diet recommendation: Heart healthy diet.  Filed Weights   07/18/18 2304  Weight: 67.4 kg    History of present illness:  As per H&P written by Dr. Olevia Bowens on 07/19/18 84 y.o. female with medical history significant of history of acute delirium, chronic diastolic CHF, anxiety, depression, carotid artery disease, CAD, essential hypertension, chronic headaches, chronic liver disease, history of right hip fracture, history of breast cancer, history of GI bleed, hyperlipidemia, history of orthostatic hypotension, diabetic polyneuropathy, osteopenia, parotid gland tumor, history of pneumonia, PSVT, syncope and collapse, type 2 diabetes mellitus who is coming to the emergency department with complaints of fever, chills, dyspnea and weakness for several days despite taking Levaquin since Tuesday the last week.  Positive headache, fatigue and malaise, but no sore throat or rhinorrhea.  She denies chest pain, palpitations, dizziness, diaphoresis, PND, orthopnea.  No abdominal pain, nausea, emesis, diarrhea,  melena or  hematochezia.  No dysuria, frequency or hematuria.  Denies polyuria, polydipsia.   Hospital Course:  1-acute on chronic respiratory failure with hypoxia in the setting of right lower lobe community-acquired pneumonia -Patient afebrile, WBC within normal limits, breathing back to baseline. -Cultures has remained without any growth. -Patient will be discharged on doxycycline 100 mg every 12 hours for a total of 7 days. -Continue as needed Mucinex to assist with expectoration. -Advised to keep yourself well-hydrated and will follow up with PCP in 10 days.  2-type 2 diabetes mellitus -Resume home hypoglycemic regimen -CBGs appears to be well controlled.  3-hyperlipidemia -Continue statins.  4-hypokalemia: -Repleted and within normal limits at discharge.  5-chronic diastolic heart failure: Patient with mild vascular congestion on chest x-ray on admission. -Received one dose of IV lasix -no crackle son examination -resume home medication regimen and low sodium diet -daily weights and fluid control discussed with son at bedside  6-HTN -stable and well controlled -Continue home antihypertensive regimen -Heart healthy diet has been encouraged.  7-hypothyroidism: -Continue Synthroid.  8-depression: -No suicidal ideation or hallucination -Continue Celexa and remeron   Procedures:  See below for x-ray reports.  Consultations:  None  Discharge Exam: Vitals:   07/20/18 1341 07/20/18 1424  BP: (!) 120/49   Pulse: (!) 58   Resp: 16   Temp: 98.2 F (36.8 C)   SpO2: 97% 96%    General: Afebrile, denies chest pain, no nausea, no vomiting; patient expressed breathing back to her baseline. Cardiovascular: S1 and S2, no rubs, no gallops, no JVD. Respiratory: No wheezing, positive rhonchi, improved air movement bilaterally.  Patient back to 2 L oxygen supplementation as she chronically uses. Abdomen: Soft, nontender, nondistended, positive bowel sounds. Extremities: No edema, no  cyanosis, no clubbing.  Discharge Instructions   Discharge Instructions    Diet - low sodium heart healthy   Complete by:  As directed    Discharge instructions   Complete by:  As directed    Keep yourself well-hydrated Take medications as prescribed Arrange follow-up with PCP in 10 days.     Allergies as of 07/20/2018      Reactions   Aricept [donepezil Hcl]    Hives, vomiting and headache   Namenda [memantine Hcl]    hives   Donepezil Nausea Only   Spironolactone Other (See Comments)   Dizziness, balance problems   Codeine Rash   Indocin [indomethacin] Rash   Latex Rash   Penicillins Rash      Medication List    STOP taking these medications   PROBIOTIC DAILY PO     TAKE these medications   acetaminophen 500 MG tablet Commonly known as:  TYLENOL Take 500 mg by mouth every 8 (eight) hours as needed for mild pain or moderate pain.   ALPRAZolam 0.25 MG tablet Commonly known as:  XANAX Take 0.25 mg by mouth 2 (two) times daily as needed for anxiety.   atorvastatin 10 MG tablet Commonly known as:  LIPITOR Take 1 tablet (10 mg total) by mouth daily.   CEREFOLIN NAC 6-90.314-2-600 MG Tabs Take 1 tablet by mouth daily.   cholecalciferol 1000 units tablet Commonly known as:  VITAMIN D Take 1,000 Units by mouth every morning.   citalopram 20 MG tablet Commonly known as:  CELEXA Take 1 tablet by mouth daily.   clopidogrel 75 MG tablet Commonly known as:  PLAVIX Take 75 mg by mouth daily.   clotrimazole-betamethasone cream Commonly known as:  LOTRISONE Apply 1 application topically 2 (two) times daily as needed (Eczema).   docusate sodium 100 MG capsule Commonly known as:  COLACE Take 100 mg by mouth 2 (two) times daily.   doxycycline 100 MG tablet Commonly known as:  VIBRA-TABS Take 1 tablet (100 mg total) by mouth 2 (two) times daily for 14 doses.   feeding supplement (GLUCERNA SHAKE) Liqd Take 237 mLs by mouth daily as needed.   furosemide 40 MG  tablet Commonly known as:  LASIX Take 1.5 tablets (60 mg total) by mouth daily. What changed:  how much to take   guaiFENesin 600 MG 12 hr tablet Commonly known as:  MUCINEX Take 1 tablet (600 mg total) by mouth 2 (two) times daily as needed for cough or to loosen phlegm.   hydrALAZINE 25 MG tablet Commonly known as:  APRESOLINE Take 1 tablet (25 mg total) by mouth 2 (two) times daily.   INCRUSE ELLIPTA 62.5 MCG/INH Aepb Generic drug:  umeclidinium bromide Inhale 1 puff into the lungs daily.   LANTUS SOLOSTAR 100 UNIT/ML Solostar Pen Generic drug:  Insulin Glargine Inject 8 Units into the skin at bedtime.   loratadine 10 MG tablet Commonly known as:  CLARITIN Take 10 mg by mouth daily.   macitentan 10 MG tablet Commonly known as:  OPSUMIT Take 10 mg by mouth daily.   metoprolol tartrate 50 MG tablet Commonly known as:  LOPRESSOR Take 2m in the a.m.and 262min the pm   mirtazapine 30 MG tablet Commonly known as:  REMERON Take 30 mg by mouth at bedtime.   multivitamin tablet Take 1 tablet by mouth daily. Reported on 02/06/2016   NOVOLOG FLEXPEN 100 UNIT/ML FlexPen Generic drug:  insulin aspart Inject 3 Units into the skin 3 (three) times  daily with meals. When BG is over 100   olmesartan 20 MG tablet Commonly known as:  BENICAR Take 5 mg by mouth daily.   pantoprazole 40 MG tablet Commonly known as:  PROTONIX TAKE 1 TABLET TWICE A DAY BEFORE MEALS What changed:  See the new instructions.   potassium chloride 10 MEQ tablet Commonly known as:  K-DUR,KLOR-CON Take 10 mEq by mouth 2 (two) times daily.   psyllium 28 % packet Commonly known as:  METAMUCIL SMOOTH TEXTURE Take 1 packet by mouth 2 (two) times daily as needed.   REFRESH DRY EYE THERAPY OP Place 1 drop into both eyes 2 (two) times daily as needed.   saccharomyces boulardii 250 MG capsule Commonly known as:  FLORASTOR Take 1 capsule (250 mg total) by mouth 2 (two) times daily.   thyroid 15 MG  tablet Commonly known as:  ARMOUR Take 15 mg by mouth daily.   URSO FORTE 500 MG tablet Generic drug:  ursodiol TAKE 1 TABLET TWICE A DAY WITH MEALS      Allergies  Allergen Reactions  . Aricept [Donepezil Hcl]     Hives, vomiting and headache  . Namenda [Memantine Hcl]     hives  . Donepezil Nausea Only  . Spironolactone Other (See Comments)    Dizziness, balance problems  . Codeine Rash  . Indocin [Indomethacin] Rash  . Latex Rash  . Penicillins Rash   Follow-up Information    [name], MD. Schedule an appointment as soon as possible for a visit in 10 day(s).   Specialty:  Internal Medicine Contact information: [place] [address] [phone]           The results of significant diagnostics from this hospitalization (including imaging, microbiology, ancillary and laboratory) are listed below for reference.    Significant Diagnostic Studies: Dg Chest 2 View  Result Date: 07/19/2018 CLINICAL DATA:  84 year old female with cough. EXAM: CHEST - 2 VIEW COMPARISON:  Multiple prior chest radiographs dating back to 05/24/2018 FINDINGS: There is cardiomegaly with vascular congestion. Increased density at the right lung base may represent developing infiltrate. Overall interval progression of density at the right lung base since the prior radiographs. Clinical correlation is recommended. There is diffuse interstitial coarsening. No large pleural effusion, or pneumothorax. No acute osseous pathology. IMPRESSION: 1. Increased density at the right lung base concerning for the infiltrate. 2. Cardiomegaly with probable mild vascular congestion. Electronically Signed   By: Anner Crete M.D.   On: 07/19/2018 00:12   US Abdomen Limited Ruq  Result Date: 07/01/2018 CLINICAL DATA:  Cirrhosis EXAM: ULTRASOUND ABDOMEN LIMITED RIGHT UPPER QUADRANT COMPARISON:  Ultrasound 01/12/2018, MRI 07/13/2017, CT 06/22/2017 FINDINGS: Gallbladder: Surgically absent  Common bile duct: Diameter: 2.7 mm Liver: Subtle contour nodularity. No focal hepatic mass lesion. Echogenicity within normal limits. Portal vein is patent on color Doppler imaging with normal direction of blood flow towards the liver. IMPRESSION: 1. Subtle contour nodularity of the liver. No focal hepatic abnormality/mass is seen. 2. Status post cholecystectomy.  No biliary enlargement. Electronically Signed   By: Donavan Foil M.D.   On: 07/01/2018 14:34    Microbiology: Recent Results (from the past 240 hour(s))  Blood culture (routine x 2)     Status: None (Preliminary result)   Collection Time: 07/18/18 11:36 PM  Result Value Ref Range Status   Specimen Description BLOOD RIGHT ARM  Final   Special Requests   Final    BOTTLES DRAWN AEROBIC AND ANAEROBIC  Blood Culture adequate volume   Culture   Final    NO GROWTH 1 DAY Performed at [facility], [address], [place], [place] [address]    Report Status PENDING  Incomplete  Blood culture (routine x 2)     Status: None (Preliminary result)   Collection Time: 07/19/18  1:07 AM  Result Value Ref Range Status   Specimen Description BLOOD RIGHT HAND  Final   Special Requests   Final    BOTTLES DRAWN AEROBIC AND ANAEROBIC Blood Culture adequate volume   Culture   Final    NO GROWTH 1 DAY Performed at [facility], [address] [place], [place] [address]    Report Status PENDING  Incomplete  Culture, sputum-assessment     Status: None   Collection Time: 07/19/18  8:33 AM  Result Value Ref Range Status   Specimen Description EXPECTORATED SPUTUM  Final   Special Requests NONE  Final   Sputum evaluation   Final    THIS SPECIMEN IS ACCEPTABLE FOR SPUTUM CULTURE Performed at [facility] Performed at [facility], [address], [place], [place] [address]    Report Status 07/19/2018 FINAL  Final  Culture, respiratory     Status: None (Preliminary result)   Collection Time: 07/19/18  8:33 AM  Result Value Ref Range Status    Specimen Description   Final    EXPECTORATED SPUTUM Performed at [facility], [address] [place], [place] [address]    Special Requests   Final    NONE Reflexed from [id number] Performed at [facility], [address] [place], [place] [address]    Gram Stain   Final    RARE WBC PRESENT, PREDOMINANTLY PMN FEW GRAM NEGATIVE RODS RARE GRAM POSITIVE COCCI    Culture   Final    CULTURE REINCUBATED FOR BETTER GROWTH Performed at [facility], [place] [address], [place], [place] [address]    Report Status PENDING  Incomplete     Labs: Basic Metabolic Panel: Recent Labs  Lab 07/18/18 2336 07/19/18 0213 07/19/18 0511 07/20/18 0556  NA 133*  --  134* 137  K 3.5  --  3.4* 3.6  CL 98  --  101 103  CO2 26  --  25 28  GLUCOSE 99  --  116* 118*  BUN 25*  --  21 12  CREATININE 1.08*  --  0.94 0.63  CALCIUM 9.0  --  8.5* 8.7*  MG  --  1.7  --   --   PHOS  --  3.3  --   --    Liver Function Tests: Recent Labs  Lab 07/18/18 2336  AST 20  ALT 11  ALKPHOS 81  BILITOT 0.7  PROT 7.1  ALBUMIN 3.5    Recent Labs  Lab 07/19/18 0213  AMMONIA 20   CBC: Recent Labs  Lab 07/18/18 2336 07/20/18 0556  WBC 3.9* 4.0  NEUTROABS 2.0 2.1  HGB 11.8* 11.2*  HCT 34.8* 34.0*  MCV 98.0 99.4  PLT 222 196    CBG: Recent Labs  Lab 07/19/18 1616 07/19/18 2048 07/20/18 0744 07/20/18 1142 07/20/18 1623  GLUCAP 180* 178* 116* 195* 83    Signed:  Barton Dubois MD.  [facility] 07/20/2018, 4:32 PM

## 2018-07-21 LAB — URINE CULTURE: Culture: NO GROWTH

## 2018-07-22 DIAGNOSIS — I2510 Atherosclerotic heart disease of native coronary artery without angina pectoris: Secondary | ICD-10-CM | POA: Diagnosis not present

## 2018-07-22 DIAGNOSIS — Z96641 Presence of right artificial hip joint: Secondary | ICD-10-CM | POA: Diagnosis not present

## 2018-07-22 DIAGNOSIS — Z794 Long term (current) use of insulin: Secondary | ICD-10-CM | POA: Diagnosis not present

## 2018-07-22 DIAGNOSIS — E039 Hypothyroidism, unspecified: Secondary | ICD-10-CM | POA: Diagnosis not present

## 2018-07-22 DIAGNOSIS — Z9981 Dependence on supplemental oxygen: Secondary | ICD-10-CM | POA: Diagnosis not present

## 2018-07-22 DIAGNOSIS — E785 Hyperlipidemia, unspecified: Secondary | ICD-10-CM | POA: Diagnosis not present

## 2018-07-22 DIAGNOSIS — Z9012 Acquired absence of left breast and nipple: Secondary | ICD-10-CM | POA: Diagnosis not present

## 2018-07-22 DIAGNOSIS — Z853 Personal history of malignant neoplasm of breast: Secondary | ICD-10-CM | POA: Diagnosis not present

## 2018-07-22 DIAGNOSIS — J181 Lobar pneumonia, unspecified organism: Secondary | ICD-10-CM | POA: Diagnosis not present

## 2018-07-22 DIAGNOSIS — E1143 Type 2 diabetes mellitus with diabetic autonomic (poly)neuropathy: Secondary | ICD-10-CM | POA: Diagnosis not present

## 2018-07-22 DIAGNOSIS — F329 Major depressive disorder, single episode, unspecified: Secondary | ICD-10-CM | POA: Diagnosis not present

## 2018-07-22 DIAGNOSIS — Z7902 Long term (current) use of antithrombotics/antiplatelets: Secondary | ICD-10-CM | POA: Diagnosis not present

## 2018-07-22 DIAGNOSIS — I6523 Occlusion and stenosis of bilateral carotid arteries: Secondary | ICD-10-CM | POA: Diagnosis not present

## 2018-07-22 DIAGNOSIS — I110 Hypertensive heart disease with heart failure: Secondary | ICD-10-CM | POA: Diagnosis not present

## 2018-07-22 DIAGNOSIS — E1142 Type 2 diabetes mellitus with diabetic polyneuropathy: Secondary | ICD-10-CM | POA: Diagnosis not present

## 2018-07-22 DIAGNOSIS — Z8781 Personal history of (healed) traumatic fracture: Secondary | ICD-10-CM | POA: Diagnosis not present

## 2018-07-22 DIAGNOSIS — Z87891 Personal history of nicotine dependence: Secondary | ICD-10-CM | POA: Diagnosis not present

## 2018-07-22 DIAGNOSIS — I5032 Chronic diastolic (congestive) heart failure: Secondary | ICD-10-CM | POA: Diagnosis not present

## 2018-07-22 DIAGNOSIS — M8580 Other specified disorders of bone density and structure, unspecified site: Secondary | ICD-10-CM | POA: Diagnosis not present

## 2018-07-22 DIAGNOSIS — I471 Supraventricular tachycardia: Secondary | ICD-10-CM | POA: Diagnosis not present

## 2018-07-22 DIAGNOSIS — J9621 Acute and chronic respiratory failure with hypoxia: Secondary | ICD-10-CM | POA: Diagnosis not present

## 2018-07-22 LAB — CULTURE, RESPIRATORY W GRAM STAIN: Culture: NORMAL

## 2018-07-24 LAB — CULTURE, BLOOD (ROUTINE X 2)
Culture: NO GROWTH
Culture: NO GROWTH
Special Requests: ADEQUATE
Special Requests: ADEQUATE

## 2018-07-26 DIAGNOSIS — J181 Lobar pneumonia, unspecified organism: Secondary | ICD-10-CM | POA: Diagnosis not present

## 2018-07-26 DIAGNOSIS — E1143 Type 2 diabetes mellitus with diabetic autonomic (poly)neuropathy: Secondary | ICD-10-CM | POA: Diagnosis not present

## 2018-07-26 DIAGNOSIS — I2510 Atherosclerotic heart disease of native coronary artery without angina pectoris: Secondary | ICD-10-CM | POA: Diagnosis not present

## 2018-07-26 DIAGNOSIS — J9621 Acute and chronic respiratory failure with hypoxia: Secondary | ICD-10-CM | POA: Diagnosis not present

## 2018-07-26 DIAGNOSIS — I110 Hypertensive heart disease with heart failure: Secondary | ICD-10-CM | POA: Diagnosis not present

## 2018-07-26 DIAGNOSIS — I5032 Chronic diastolic (congestive) heart failure: Secondary | ICD-10-CM | POA: Diagnosis not present

## 2018-07-27 DIAGNOSIS — I2510 Atherosclerotic heart disease of native coronary artery without angina pectoris: Secondary | ICD-10-CM | POA: Diagnosis not present

## 2018-07-27 DIAGNOSIS — E1143 Type 2 diabetes mellitus with diabetic autonomic (poly)neuropathy: Secondary | ICD-10-CM | POA: Diagnosis not present

## 2018-07-27 DIAGNOSIS — J181 Lobar pneumonia, unspecified organism: Secondary | ICD-10-CM | POA: Diagnosis not present

## 2018-07-27 DIAGNOSIS — J9621 Acute and chronic respiratory failure with hypoxia: Secondary | ICD-10-CM | POA: Diagnosis not present

## 2018-07-27 DIAGNOSIS — I5032 Chronic diastolic (congestive) heart failure: Secondary | ICD-10-CM | POA: Diagnosis not present

## 2018-07-27 DIAGNOSIS — I110 Hypertensive heart disease with heart failure: Secondary | ICD-10-CM | POA: Diagnosis not present

## 2018-07-28 DIAGNOSIS — J9621 Acute and chronic respiratory failure with hypoxia: Secondary | ICD-10-CM | POA: Diagnosis not present

## 2018-07-28 DIAGNOSIS — E1143 Type 2 diabetes mellitus with diabetic autonomic (poly)neuropathy: Secondary | ICD-10-CM | POA: Diagnosis not present

## 2018-07-28 DIAGNOSIS — I2510 Atherosclerotic heart disease of native coronary artery without angina pectoris: Secondary | ICD-10-CM | POA: Diagnosis not present

## 2018-07-28 DIAGNOSIS — I110 Hypertensive heart disease with heart failure: Secondary | ICD-10-CM | POA: Diagnosis not present

## 2018-07-28 DIAGNOSIS — J181 Lobar pneumonia, unspecified organism: Secondary | ICD-10-CM | POA: Diagnosis not present

## 2018-07-28 DIAGNOSIS — I5032 Chronic diastolic (congestive) heart failure: Secondary | ICD-10-CM | POA: Diagnosis not present

## 2018-07-29 DIAGNOSIS — J9621 Acute and chronic respiratory failure with hypoxia: Secondary | ICD-10-CM | POA: Diagnosis not present

## 2018-07-29 DIAGNOSIS — I2510 Atherosclerotic heart disease of native coronary artery without angina pectoris: Secondary | ICD-10-CM | POA: Diagnosis not present

## 2018-07-29 DIAGNOSIS — E1143 Type 2 diabetes mellitus with diabetic autonomic (poly)neuropathy: Secondary | ICD-10-CM | POA: Diagnosis not present

## 2018-07-29 DIAGNOSIS — I110 Hypertensive heart disease with heart failure: Secondary | ICD-10-CM | POA: Diagnosis not present

## 2018-07-29 DIAGNOSIS — I5032 Chronic diastolic (congestive) heart failure: Secondary | ICD-10-CM | POA: Diagnosis not present

## 2018-07-29 DIAGNOSIS — J181 Lobar pneumonia, unspecified organism: Secondary | ICD-10-CM | POA: Diagnosis not present

## 2018-08-01 DIAGNOSIS — J449 Chronic obstructive pulmonary disease, unspecified: Secondary | ICD-10-CM | POA: Diagnosis not present

## 2018-08-01 DIAGNOSIS — I272 Pulmonary hypertension, unspecified: Secondary | ICD-10-CM | POA: Diagnosis not present

## 2018-08-01 DIAGNOSIS — R031 Nonspecific low blood-pressure reading: Secondary | ICD-10-CM | POA: Diagnosis not present

## 2018-08-01 DIAGNOSIS — J189 Pneumonia, unspecified organism: Secondary | ICD-10-CM | POA: Diagnosis not present

## 2018-08-01 DIAGNOSIS — I5030 Unspecified diastolic (congestive) heart failure: Secondary | ICD-10-CM | POA: Diagnosis not present

## 2018-08-02 DIAGNOSIS — I5032 Chronic diastolic (congestive) heart failure: Secondary | ICD-10-CM | POA: Diagnosis not present

## 2018-08-02 DIAGNOSIS — J181 Lobar pneumonia, unspecified organism: Secondary | ICD-10-CM | POA: Diagnosis not present

## 2018-08-02 DIAGNOSIS — I110 Hypertensive heart disease with heart failure: Secondary | ICD-10-CM | POA: Diagnosis not present

## 2018-08-02 DIAGNOSIS — J9621 Acute and chronic respiratory failure with hypoxia: Secondary | ICD-10-CM | POA: Diagnosis not present

## 2018-08-02 DIAGNOSIS — E1143 Type 2 diabetes mellitus with diabetic autonomic (poly)neuropathy: Secondary | ICD-10-CM | POA: Diagnosis not present

## 2018-08-02 DIAGNOSIS — I2510 Atherosclerotic heart disease of native coronary artery without angina pectoris: Secondary | ICD-10-CM | POA: Diagnosis not present

## 2018-08-03 DIAGNOSIS — J181 Lobar pneumonia, unspecified organism: Secondary | ICD-10-CM | POA: Diagnosis not present

## 2018-08-03 DIAGNOSIS — I2510 Atherosclerotic heart disease of native coronary artery without angina pectoris: Secondary | ICD-10-CM | POA: Diagnosis not present

## 2018-08-03 DIAGNOSIS — I5032 Chronic diastolic (congestive) heart failure: Secondary | ICD-10-CM | POA: Diagnosis not present

## 2018-08-03 DIAGNOSIS — E1143 Type 2 diabetes mellitus with diabetic autonomic (poly)neuropathy: Secondary | ICD-10-CM | POA: Diagnosis not present

## 2018-08-03 DIAGNOSIS — J9621 Acute and chronic respiratory failure with hypoxia: Secondary | ICD-10-CM | POA: Diagnosis not present

## 2018-08-03 DIAGNOSIS — I110 Hypertensive heart disease with heart failure: Secondary | ICD-10-CM | POA: Diagnosis not present

## 2018-08-04 DIAGNOSIS — R918 Other nonspecific abnormal finding of lung field: Secondary | ICD-10-CM | POA: Diagnosis not present

## 2018-08-04 DIAGNOSIS — R911 Solitary pulmonary nodule: Secondary | ICD-10-CM | POA: Diagnosis not present

## 2018-08-04 DIAGNOSIS — J439 Emphysema, unspecified: Secondary | ICD-10-CM | POA: Diagnosis not present

## 2018-08-05 DIAGNOSIS — J9621 Acute and chronic respiratory failure with hypoxia: Secondary | ICD-10-CM | POA: Diagnosis not present

## 2018-08-05 DIAGNOSIS — J181 Lobar pneumonia, unspecified organism: Secondary | ICD-10-CM | POA: Diagnosis not present

## 2018-08-05 DIAGNOSIS — I5032 Chronic diastolic (congestive) heart failure: Secondary | ICD-10-CM | POA: Diagnosis not present

## 2018-08-05 DIAGNOSIS — I2510 Atherosclerotic heart disease of native coronary artery without angina pectoris: Secondary | ICD-10-CM | POA: Diagnosis not present

## 2018-08-05 DIAGNOSIS — E1143 Type 2 diabetes mellitus with diabetic autonomic (poly)neuropathy: Secondary | ICD-10-CM | POA: Diagnosis not present

## 2018-08-05 DIAGNOSIS — I110 Hypertensive heart disease with heart failure: Secondary | ICD-10-CM | POA: Diagnosis not present

## 2018-08-08 DIAGNOSIS — I110 Hypertensive heart disease with heart failure: Secondary | ICD-10-CM | POA: Diagnosis not present

## 2018-08-08 DIAGNOSIS — E1143 Type 2 diabetes mellitus with diabetic autonomic (poly)neuropathy: Secondary | ICD-10-CM | POA: Diagnosis not present

## 2018-08-08 DIAGNOSIS — J9621 Acute and chronic respiratory failure with hypoxia: Secondary | ICD-10-CM | POA: Diagnosis not present

## 2018-08-08 DIAGNOSIS — J181 Lobar pneumonia, unspecified organism: Secondary | ICD-10-CM | POA: Diagnosis not present

## 2018-08-08 DIAGNOSIS — I2510 Atherosclerotic heart disease of native coronary artery without angina pectoris: Secondary | ICD-10-CM | POA: Diagnosis not present

## 2018-08-08 DIAGNOSIS — I5032 Chronic diastolic (congestive) heart failure: Secondary | ICD-10-CM | POA: Diagnosis not present

## 2018-08-10 DIAGNOSIS — J9621 Acute and chronic respiratory failure with hypoxia: Secondary | ICD-10-CM | POA: Diagnosis not present

## 2018-08-10 DIAGNOSIS — I2510 Atherosclerotic heart disease of native coronary artery without angina pectoris: Secondary | ICD-10-CM | POA: Diagnosis not present

## 2018-08-10 DIAGNOSIS — I110 Hypertensive heart disease with heart failure: Secondary | ICD-10-CM | POA: Diagnosis not present

## 2018-08-10 DIAGNOSIS — E1143 Type 2 diabetes mellitus with diabetic autonomic (poly)neuropathy: Secondary | ICD-10-CM | POA: Diagnosis not present

## 2018-08-10 DIAGNOSIS — I5032 Chronic diastolic (congestive) heart failure: Secondary | ICD-10-CM | POA: Diagnosis not present

## 2018-08-10 DIAGNOSIS — J181 Lobar pneumonia, unspecified organism: Secondary | ICD-10-CM | POA: Diagnosis not present

## 2018-08-11 DIAGNOSIS — H26492 Other secondary cataract, left eye: Secondary | ICD-10-CM | POA: Diagnosis not present

## 2018-08-11 DIAGNOSIS — Z961 Presence of intraocular lens: Secondary | ICD-10-CM | POA: Diagnosis not present

## 2018-08-12 DIAGNOSIS — I2510 Atherosclerotic heart disease of native coronary artery without angina pectoris: Secondary | ICD-10-CM | POA: Diagnosis not present

## 2018-08-12 DIAGNOSIS — I5032 Chronic diastolic (congestive) heart failure: Secondary | ICD-10-CM | POA: Diagnosis not present

## 2018-08-12 DIAGNOSIS — J181 Lobar pneumonia, unspecified organism: Secondary | ICD-10-CM | POA: Diagnosis not present

## 2018-08-12 DIAGNOSIS — I110 Hypertensive heart disease with heart failure: Secondary | ICD-10-CM | POA: Diagnosis not present

## 2018-08-12 DIAGNOSIS — J9621 Acute and chronic respiratory failure with hypoxia: Secondary | ICD-10-CM | POA: Diagnosis not present

## 2018-08-12 DIAGNOSIS — E1143 Type 2 diabetes mellitus with diabetic autonomic (poly)neuropathy: Secondary | ICD-10-CM | POA: Diagnosis not present

## 2018-08-16 DIAGNOSIS — J9621 Acute and chronic respiratory failure with hypoxia: Secondary | ICD-10-CM | POA: Diagnosis not present

## 2018-08-16 DIAGNOSIS — J181 Lobar pneumonia, unspecified organism: Secondary | ICD-10-CM | POA: Diagnosis not present

## 2018-08-16 DIAGNOSIS — I2510 Atherosclerotic heart disease of native coronary artery without angina pectoris: Secondary | ICD-10-CM | POA: Diagnosis not present

## 2018-08-16 DIAGNOSIS — E1143 Type 2 diabetes mellitus with diabetic autonomic (poly)neuropathy: Secondary | ICD-10-CM | POA: Diagnosis not present

## 2018-08-16 DIAGNOSIS — I110 Hypertensive heart disease with heart failure: Secondary | ICD-10-CM | POA: Diagnosis not present

## 2018-08-16 DIAGNOSIS — I5032 Chronic diastolic (congestive) heart failure: Secondary | ICD-10-CM | POA: Diagnosis not present

## 2018-08-18 ENCOUNTER — Other Ambulatory Visit (HOSPITAL_COMMUNITY)
Admission: AD | Admit: 2018-08-18 | Discharge: 2018-08-18 | Disposition: A | Discharge status: Home / Self Care | Payer: Medicare Other | Source: Skilled Nursing Facility | Attending: Internal Medicine | Admitting: Internal Medicine

## 2018-08-18 DIAGNOSIS — N390 Urinary tract infection, site not specified: Secondary | ICD-10-CM | POA: Insufficient documentation

## 2018-08-18 DIAGNOSIS — I110 Hypertensive heart disease with heart failure: Secondary | ICD-10-CM | POA: Diagnosis not present

## 2018-08-18 DIAGNOSIS — J181 Lobar pneumonia, unspecified organism: Secondary | ICD-10-CM | POA: Diagnosis not present

## 2018-08-18 DIAGNOSIS — I5032 Chronic diastolic (congestive) heart failure: Secondary | ICD-10-CM | POA: Diagnosis not present

## 2018-08-18 DIAGNOSIS — E1143 Type 2 diabetes mellitus with diabetic autonomic (poly)neuropathy: Secondary | ICD-10-CM | POA: Diagnosis not present

## 2018-08-18 DIAGNOSIS — I2510 Atherosclerotic heart disease of native coronary artery without angina pectoris: Secondary | ICD-10-CM | POA: Diagnosis not present

## 2018-08-18 DIAGNOSIS — J9621 Acute and chronic respiratory failure with hypoxia: Secondary | ICD-10-CM | POA: Diagnosis not present

## 2018-08-18 LAB — URINALYSIS, ROUTINE W REFLEX MICROSCOPIC
Bacteria, UA: NONE SEEN
Bilirubin Urine: NEGATIVE
Glucose, UA: NEGATIVE mg/dL
Hgb urine dipstick: NEGATIVE
Ketones, ur: NEGATIVE mg/dL
Nitrite: NEGATIVE
Protein, ur: NEGATIVE mg/dL
Specific Gravity, Urine: 1.006 (ref 1.005–1.030)
pH: 7 (ref 5.0–8.0)

## 2018-08-20 LAB — URINE CULTURE

## 2018-08-22 DIAGNOSIS — E118 Type 2 diabetes mellitus with unspecified complications: Secondary | ICD-10-CM | POA: Diagnosis not present

## 2018-08-22 DIAGNOSIS — E119 Type 2 diabetes mellitus without complications: Secondary | ICD-10-CM | POA: Diagnosis not present

## 2018-08-22 DIAGNOSIS — I10 Essential (primary) hypertension: Secondary | ICD-10-CM | POA: Diagnosis not present

## 2018-08-24 DIAGNOSIS — J181 Lobar pneumonia, unspecified organism: Secondary | ICD-10-CM | POA: Diagnosis not present

## 2018-08-24 DIAGNOSIS — I2510 Atherosclerotic heart disease of native coronary artery without angina pectoris: Secondary | ICD-10-CM | POA: Diagnosis not present

## 2018-08-24 DIAGNOSIS — I110 Hypertensive heart disease with heart failure: Secondary | ICD-10-CM | POA: Diagnosis not present

## 2018-08-24 DIAGNOSIS — J9621 Acute and chronic respiratory failure with hypoxia: Secondary | ICD-10-CM | POA: Diagnosis not present

## 2018-08-24 DIAGNOSIS — E1143 Type 2 diabetes mellitus with diabetic autonomic (poly)neuropathy: Secondary | ICD-10-CM | POA: Diagnosis not present

## 2018-08-24 DIAGNOSIS — I5032 Chronic diastolic (congestive) heart failure: Secondary | ICD-10-CM | POA: Diagnosis not present

## 2018-08-25 DIAGNOSIS — H43821 Vitreomacular adhesion, right eye: Secondary | ICD-10-CM | POA: Diagnosis not present

## 2018-08-25 DIAGNOSIS — H33101 Unspecified retinoschisis, right eye: Secondary | ICD-10-CM | POA: Diagnosis not present

## 2018-08-25 DIAGNOSIS — H26491 Other secondary cataract, right eye: Secondary | ICD-10-CM | POA: Diagnosis not present

## 2018-08-25 DIAGNOSIS — H26492 Other secondary cataract, left eye: Secondary | ICD-10-CM | POA: Diagnosis not present

## 2018-08-29 DIAGNOSIS — E1140 Type 2 diabetes mellitus with diabetic neuropathy, unspecified: Secondary | ICD-10-CM | POA: Diagnosis not present

## 2018-08-29 DIAGNOSIS — J449 Chronic obstructive pulmonary disease, unspecified: Secondary | ICD-10-CM | POA: Diagnosis not present

## 2018-08-29 DIAGNOSIS — F419 Anxiety disorder, unspecified: Secondary | ICD-10-CM | POA: Diagnosis not present

## 2018-08-29 DIAGNOSIS — E039 Hypothyroidism, unspecified: Secondary | ICD-10-CM | POA: Diagnosis not present

## 2018-08-29 DIAGNOSIS — E7800 Pure hypercholesterolemia, unspecified: Secondary | ICD-10-CM | POA: Diagnosis not present

## 2018-08-29 DIAGNOSIS — I272 Pulmonary hypertension, unspecified: Secondary | ICD-10-CM | POA: Diagnosis not present

## 2018-08-29 DIAGNOSIS — I10 Essential (primary) hypertension: Secondary | ICD-10-CM | POA: Diagnosis not present

## 2018-08-30 DIAGNOSIS — I110 Hypertensive heart disease with heart failure: Secondary | ICD-10-CM | POA: Diagnosis not present

## 2018-08-30 DIAGNOSIS — I5032 Chronic diastolic (congestive) heart failure: Secondary | ICD-10-CM | POA: Diagnosis not present

## 2018-08-30 DIAGNOSIS — I2510 Atherosclerotic heart disease of native coronary artery without angina pectoris: Secondary | ICD-10-CM | POA: Diagnosis not present

## 2018-08-30 DIAGNOSIS — J181 Lobar pneumonia, unspecified organism: Secondary | ICD-10-CM | POA: Diagnosis not present

## 2018-08-30 DIAGNOSIS — J9621 Acute and chronic respiratory failure with hypoxia: Secondary | ICD-10-CM | POA: Diagnosis not present

## 2018-08-30 DIAGNOSIS — E1143 Type 2 diabetes mellitus with diabetic autonomic (poly)neuropathy: Secondary | ICD-10-CM | POA: Diagnosis not present

## 2018-09-05 DIAGNOSIS — J181 Lobar pneumonia, unspecified organism: Secondary | ICD-10-CM | POA: Diagnosis not present

## 2018-09-05 DIAGNOSIS — E1143 Type 2 diabetes mellitus with diabetic autonomic (poly)neuropathy: Secondary | ICD-10-CM | POA: Diagnosis not present

## 2018-09-05 DIAGNOSIS — J9621 Acute and chronic respiratory failure with hypoxia: Secondary | ICD-10-CM | POA: Diagnosis not present

## 2018-09-05 DIAGNOSIS — I5032 Chronic diastolic (congestive) heart failure: Secondary | ICD-10-CM | POA: Diagnosis not present

## 2018-09-05 DIAGNOSIS — I110 Hypertensive heart disease with heart failure: Secondary | ICD-10-CM | POA: Diagnosis not present

## 2018-09-05 DIAGNOSIS — I2510 Atherosclerotic heart disease of native coronary artery without angina pectoris: Secondary | ICD-10-CM | POA: Diagnosis not present

## 2018-09-13 DIAGNOSIS — I5032 Chronic diastolic (congestive) heart failure: Secondary | ICD-10-CM | POA: Diagnosis not present

## 2018-09-13 DIAGNOSIS — J9621 Acute and chronic respiratory failure with hypoxia: Secondary | ICD-10-CM | POA: Diagnosis not present

## 2018-09-13 DIAGNOSIS — E1143 Type 2 diabetes mellitus with diabetic autonomic (poly)neuropathy: Secondary | ICD-10-CM | POA: Diagnosis not present

## 2018-09-13 DIAGNOSIS — I110 Hypertensive heart disease with heart failure: Secondary | ICD-10-CM | POA: Diagnosis not present

## 2018-09-13 DIAGNOSIS — J181 Lobar pneumonia, unspecified organism: Secondary | ICD-10-CM | POA: Diagnosis not present

## 2018-09-13 DIAGNOSIS — I2510 Atherosclerotic heart disease of native coronary artery without angina pectoris: Secondary | ICD-10-CM | POA: Diagnosis not present

## 2018-09-21 DIAGNOSIS — I270 Primary pulmonary hypertension: Secondary | ICD-10-CM | POA: Diagnosis not present

## 2018-09-21 DIAGNOSIS — K7469 Other cirrhosis of liver: Secondary | ICD-10-CM | POA: Diagnosis not present

## 2018-09-21 DIAGNOSIS — R768 Other specified abnormal immunological findings in serum: Secondary | ICD-10-CM | POA: Diagnosis not present

## 2018-09-21 DIAGNOSIS — I2510 Atherosclerotic heart disease of native coronary artery without angina pectoris: Secondary | ICD-10-CM | POA: Diagnosis not present

## 2018-09-26 DIAGNOSIS — B351 Tinea unguium: Secondary | ICD-10-CM | POA: Diagnosis not present

## 2018-09-26 DIAGNOSIS — M79674 Pain in right toe(s): Secondary | ICD-10-CM | POA: Diagnosis not present

## 2018-09-26 DIAGNOSIS — M79675 Pain in left toe(s): Secondary | ICD-10-CM | POA: Diagnosis not present

## 2018-09-26 DIAGNOSIS — E1149 Type 2 diabetes mellitus with other diabetic neurological complication: Secondary | ICD-10-CM | POA: Diagnosis not present

## 2018-09-28 DIAGNOSIS — J449 Chronic obstructive pulmonary disease, unspecified: Secondary | ICD-10-CM | POA: Diagnosis not present

## 2018-09-28 DIAGNOSIS — I272 Pulmonary hypertension, unspecified: Secondary | ICD-10-CM | POA: Diagnosis not present

## 2018-10-02 ENCOUNTER — Encounter (HOSPITAL_COMMUNITY): Payer: Self-pay

## 2018-10-02 ENCOUNTER — Emergency Department (HOSPITAL_COMMUNITY)
Admission: EM | Admit: 2018-10-02 | Discharge: 2018-10-02 | Disposition: A | Discharge status: Home / Self Care | Payer: Medicare Other | Attending: Emergency Medicine | Admitting: Emergency Medicine

## 2018-10-02 ENCOUNTER — Emergency Department (HOSPITAL_COMMUNITY): Payer: Medicare Other

## 2018-10-02 DIAGNOSIS — Y92009 Unspecified place in unspecified non-institutional (private) residence as the place of occurrence of the external cause: Secondary | ICD-10-CM | POA: Insufficient documentation

## 2018-10-02 DIAGNOSIS — I5032 Chronic diastolic (congestive) heart failure: Secondary | ICD-10-CM | POA: Diagnosis not present

## 2018-10-02 DIAGNOSIS — Z7902 Long term (current) use of antithrombotics/antiplatelets: Secondary | ICD-10-CM | POA: Insufficient documentation

## 2018-10-02 DIAGNOSIS — E785 Hyperlipidemia, unspecified: Secondary | ICD-10-CM | POA: Diagnosis not present

## 2018-10-02 DIAGNOSIS — Z79899 Other long term (current) drug therapy: Secondary | ICD-10-CM | POA: Insufficient documentation

## 2018-10-02 DIAGNOSIS — Z87891 Personal history of nicotine dependence: Secondary | ICD-10-CM | POA: Diagnosis not present

## 2018-10-02 DIAGNOSIS — Y998 Other external cause status: Secondary | ICD-10-CM | POA: Insufficient documentation

## 2018-10-02 DIAGNOSIS — Y939 Activity, unspecified: Secondary | ICD-10-CM | POA: Insufficient documentation

## 2018-10-02 DIAGNOSIS — S61211A Laceration without foreign body of left index finger without damage to nail, initial encounter: Secondary | ICD-10-CM | POA: Insufficient documentation

## 2018-10-02 DIAGNOSIS — I2510 Atherosclerotic heart disease of native coronary artery without angina pectoris: Secondary | ICD-10-CM | POA: Insufficient documentation

## 2018-10-02 DIAGNOSIS — S61218A Laceration without foreign body of other finger without damage to nail, initial encounter: Secondary | ICD-10-CM

## 2018-10-02 DIAGNOSIS — Z794 Long term (current) use of insulin: Secondary | ICD-10-CM | POA: Diagnosis not present

## 2018-10-02 DIAGNOSIS — Z23 Encounter for immunization: Secondary | ICD-10-CM | POA: Insufficient documentation

## 2018-10-02 DIAGNOSIS — I110 Hypertensive heart disease with heart failure: Secondary | ICD-10-CM | POA: Diagnosis not present

## 2018-10-02 DIAGNOSIS — W228XXA Striking against or struck by other objects, initial encounter: Secondary | ICD-10-CM | POA: Insufficient documentation

## 2018-10-02 DIAGNOSIS — Z91040 Latex allergy status: Secondary | ICD-10-CM | POA: Insufficient documentation

## 2018-10-02 DIAGNOSIS — E119 Type 2 diabetes mellitus without complications: Secondary | ICD-10-CM | POA: Diagnosis not present

## 2018-10-02 MED ORDER — POVIDONE-IODINE 10 % EX SOLN
CUTANEOUS | Status: AC
  Filled 2018-10-02: qty 15

## 2018-10-02 MED ORDER — TETANUS-DIPHTHERIA TOXOIDS TD 5-2 LFU IM INJ: 0.5000 mL | INJECTION | Freq: Once | INTRAMUSCULAR | Status: DC

## 2018-10-02 MED ORDER — TETANUS-DIPHTH-ACELL PERTUSSIS 5-2.5-18.5 LF-MCG/0.5 IM SUSP
INTRAMUSCULAR | Status: AC
  Filled 2018-10-02: qty 0.5

## 2018-10-02 MED ORDER — POVIDONE-IODINE 10 % OINT PACKET
TOPICAL_OINTMENT | CUTANEOUS | Status: AC
  Filled 2018-10-02: qty 1

## 2018-10-02 MED ORDER — LIDOCAINE HCL (PF) 2 % IJ SOLN: 10.0000 mL | Freq: Once | INTRAMUSCULAR | Status: DC

## 2018-10-02 MED ORDER — TETANUS-DIPHTH-ACELL PERTUSSIS 5-2.5-18.5 LF-MCG/0.5 IM SUSP
0.5000 mL | Freq: Once | INTRAMUSCULAR | Status: AC
  Administered 2018-10-02: 0.5 mL via INTRAMUSCULAR

## 2018-10-02 MED ORDER — LIDOCAINE HCL (PF) 2 % IJ SOLN
INTRAMUSCULAR | Status: AC
  Filled 2018-10-02: qty 20

## 2018-10-02 NOTE — Discharge Instructions (Addendum)
Take 2 Tylenol immediately when you get home.  Take 2 more before you go to bed.  Elevate hand.  Keep initial dressing on for 2 days, then can change with a simple Band-Aid.  Sutures out 10 days.

## 2018-10-02 NOTE — ED Notes (Signed)
Pt has crescent shaped laceration to left index finger. Bleeding controlled

## 2018-10-02 NOTE — ED Provider Notes (Signed)
[facility] EMERGENCY DEPARTMENT Provider Note   CSN: [id number] Arrival date & time: 10/02/18  1514     History   Chief Complaint Chief Complaint  Patient presents with  . Finger Injury    HPI [name] is a 84 y.o. female.  Laceration to left index finger after closing her finger in the refrigerator door just prior to ED visit.  No other injury.  Severity is mild.  Palpation makes symptoms worse.     Past Medical History:  Diagnosis Date  . Acute delirium 06/23/2013  . Acute diastolic congestive heart failure ([place]) 03/21/2014  . Anxiety   . Cancer ([place])   . Carotid artery disease (HCC)    Bilateral ICA occlusion  . Chronic headaches   . Chronic liver disease    ?cirrhosis on 10/2014 CT. H/O elevated alkphos and +AMA on URSO.  Marland Kitchen Coronary atherosclerosis of native coronary artery    Mild nonobstructive disease at cardiac catheterization May 2011  . Depression   . Essential hypertension, benign   . Fracture of femoral neck, right, closed ([place]) 03/28/2014  . History of breast cancer   . History of GI bleed   . Hyperlipidemia   . Orthostatic hypotension    Diabetic polyneuropathy/diabetic dysautonomia   . Osteopenia   . Parotid tumor   . Pneumonia 11/2016  . Pneumonia   . Pressure ulcer, heel, right, unstageable ([place]) 04/17/2014  . PSVT (paroxysmal supraventricular tachycardia) ([place])   . Syncope and collapse 04/15/2010   Qualifier: Diagnosis of  By: Angelena Form, MD, Harrell Gave    . Type 2 diabetes mellitus ([place])   . UPPER GASTROINTESTINAL HEMORRHAGE 03/21/2007   Qualifier: Diagnosis of  By: Jonna Munro MD, Cornelius      Patient Active Problem List   Diagnosis Date Noted  . Acute on chronic respiratory failure with hypoxia ([place])   . CAP (community acquired pneumonia) 07/19/2018  . Flatulence/wind 07/06/2018  . Pulmonary hypertension ([place]) 04/25/2017  . Dysphagia 07/30/2016  . Hypothyroid 12/16/2015  . Cirrhosis of liver ([place]) 07/17/2015  . Congestive heart  disease ([place])   . Type 2 diabetes mellitus without complication ([place])   . Diastolic CHF, chronic ([place]) 10/25/2014  . Bloating 06/27/2014  . SVT (supraventricular tachycardia) ([place]) 04/17/2014  . Pressure ulcer, heel, right, unstageable ([place]) 04/17/2014  . Anemia 04/17/2014  . Fracture of femoral neck, right, closed ([place]) 03/28/2014  . Hip fracture ([place]) 03/21/2014  . Acute diastolic congestive heart failure ([place]) 03/21/2014  . Abnormal carotid duplex scan 02/20/2014  . Orthostatic hypotension 02/20/2014  . Right sided weakness 02/16/2014  . Loss of weight 06/23/2013  . Primary biliary cirrhosis ([place]) 06/12/2013  . Leg weakness, bilateral 06/12/2013  . PSVT (paroxysmal supraventricular tachycardia) ([place]) 06/12/2013  . Chronic cough 11/28/2011  . Dyspnea 10/21/2011  . H/O tobacco use, presenting hazards to health 10/21/2011  . CORONARY ATHEROSCLEROSIS NATIVE CORONARY ARTERY 04/15/2010  . Type 2 diabetes mellitus ([place]) 07/30/2008  . TREMOR, LEFT HAND 05/07/2008  . SCAR CONDITION AND FIBROSIS OF SKIN 03/05/2008  . Hyperlipidemia 10/11/2006  . Anxiety state 10/11/2006  . Depression 10/11/2006  . Essential hypertension 10/11/2006  . OSTEOPENIA 10/11/2006  . NEOPLASM, MALIGNANT, BREAST, HX OF 10/11/2006    Past Surgical History:  Procedure Laterality Date  . ABDOMINAL HYSTERECTOMY    . APPENDECTOMY    . CHOLECYSTECTOMY  2008  . COLONOSCOPY  2010   Dr. Oneida Alar: single tubular adenoma, one hyperplastic polyp. Next TCS 2020 health permitting.  . ESOPHAGOGASTRODUODENOSCOPY N/A 08/17/2016  Procedure: ESOPHAGOGASTRODUODENOSCOPY (EGD);  Surgeon: Danie Binder, MD;  Location: [facility];  Service: Endoscopy;  Laterality: N/A;  8:45 am  . ESOPHAGOGASTRODUODENOSCOPY N/A 01/01/2017   Procedure: ESOPHAGOGASTRODUODENOSCOPY (EGD);  Surgeon: Danie Binder, MD;  Location: [facility];  Service: Endoscopy;  Laterality: N/A;  9:15 am  . HEMORRHOID SURGERY  1960s  . HIP ARTHROPLASTY Right  03/28/2014   Procedure: ARTHROPLASTY BIPOLAR HIP;  Surgeon: Carole Civil, MD;  Location: [facility] ORS;  Service: Orthopedics;  Laterality: Right;  . LAPAROSCOPIC APPENDECTOMY N/A 04/13/2013   Procedure: APPENDECTOMY LAPAROSCOPIC;  Surgeon: Donato Heinz, MD;  Location: [facility] ORS;  Service: General;  Laterality: N/A;  . MASTECTOMY  1994   Left breast -related to breast cancer  . RIGHT HEART CATH N/A 04/29/2017   Procedure: Right Heart Cath;  Surgeon: Adrian Prows, MD;  Location: [facility];  Service: Cardiovascular;  Laterality: N/A;  . RIGHT/LEFT HEART CATH AND CORONARY ANGIOGRAPHY N/A 04/13/2017   Procedure: RIGHT/LEFT HEART CATH AND CORONARY ANGIOGRAPHY;  Surgeon: Adrian Prows, MD;  Location: [facility];  Service: Cardiovascular;  Laterality: N/A;  . SAVORY DILATION N/A 08/17/2016   Procedure: SAVORY DILATION;  Surgeon: Danie Binder, MD;  Location: [facility];  Service: Endoscopy;  Laterality: N/A;  . VESICOVAGINAL FISTULA CLOSURE W/ TAH  1979     OB History   None      Home Medications    Prior to Admission medications   Medication Sig Start Date End Date Taking? Authorizing Provider  acetaminophen (TYLENOL) 500 MG tablet Take 500 mg by mouth every 8 (eight) hours as needed for mild pain or moderate pain.    [provider]  ALPRAZolam Duanne Moron) 0.25 MG tablet Take 0.25 mg by mouth 2 (two) times daily as needed for anxiety.    [provider]  atorvastatin (LIPITOR) 10 MG tablet Take 1 tablet (10 mg total) by mouth daily. 02/20/14   [name], MD  cholecalciferol (VITAMIN D) 1000 UNITS tablet Take 1,000 Units by mouth every morning.     [provider]  citalopram (CELEXA) 20 MG tablet Take 1 tablet by mouth daily. 06/05/18   [provider]  clopidogrel (PLAVIX) 75 MG tablet Take 75 mg by mouth daily.    [provider]  clotrimazole-betamethasone (LOTRISONE) cream Apply 1 application topically 2 (two) times daily as needed  (Eczema).  10/26/16   [provider]  docusate sodium (COLACE) 100 MG capsule Take 100 mg by mouth 2 (two) times daily.    [provider]  feeding supplement, GLUCERNA SHAKE, (GLUCERNA SHAKE) LIQD Take 237 mLs by mouth daily as needed.     [provider]  furosemide (LASIX) 40 MG tablet Take 1.5 tablets (60 mg total) by mouth daily. Patient taking differently: Take 40 mg by mouth daily.  02/18/16   Minus Breeding, MD  Glycerin-Polysorbate 80 (REFRESH DRY EYE THERAPY OP) Place 1 drop into both eyes 2 (two) times daily as needed.    [provider]  guaiFENesin (MUCINEX) 600 MG 12 hr tablet Take 1 tablet (600 mg total) by mouth 2 (two) times daily as needed for cough or to loosen phlegm. 07/20/18 07/20/19  Barton Dubois, MD  hydrALAZINE (APRESOLINE) 25 MG tablet Take 1 tablet (25 mg total) by mouth 2 (two) times daily. 12/16/15   Imogene Burn, PA-C  insulin aspart (NOVOLOG FLEXPEN) 100 UNIT/ML FlexPen Inject 3 Units into the skin 3 (three) times daily with  meals. When BG is over 100     [provider]  Insulin Glargine (LANTUS SOLOSTAR) 100 UNIT/ML Solostar Pen Inject 8 Units into the skin at bedtime.    [provider]  loratadine (CLARITIN) 10 MG tablet Take 10 mg by mouth daily.    [provider]  Macitentan 10 MG TABS Take 10 mg by mouth daily.    [provider]  Methylfol-Algae-B12-Acetylcyst (CEREFOLIN NAC) 6-90.314-2-600 MG TABS Take 1 tablet by mouth daily.     [provider]  metoprolol (LOPRESSOR) 50 MG tablet Take 49m in the a.m.and 238min the pm 12/16/15   LeImogene BurnPA-C  mirtazapine (REMERON) 30 MG tablet Take 30 mg by mouth at bedtime.    [provider]  Multiple Vitamin (MULTIVITAMIN) tablet Take 1 tablet by mouth daily. Reported on 02/06/2016    [provider]  olmesartan (BENICAR) 20 MG tablet Take 5 mg by mouth daily.    [provider]  pantoprazole (PROTONIX)  40 MG tablet TAKE 1 TABLET TWICE A DAY BEFORE MEALS Patient taking differently: Take 40 mg by mouth daily.  12/23/17   GiCarlis StableNP  potassium chloride (K-DUR,KLOR-CON) 10 MEQ tablet Take 10 mEq by mouth 2 (two) times daily.     [provider]  psyllium (METAMUCIL SMOOTH TEXTURE) 28 % packet Take 1 packet by mouth 2 (two) times daily as needed.     [provider]  saccharomyces boulardii (FLORASTOR) 250 MG capsule Take 1 capsule (250 mg total) by mouth 2 (two) times daily. 07/20/18   MaBarton DuboisMD  thyroid (ARMOUR) 15 MG tablet Take 15 mg by mouth daily.    [provider]  umeclidinium bromide (INCRUSE ELLIPTA) 62.5 MCG/INH AEPB Inhale 1 puff into the lungs daily.    [provider]  URSO FORTE 500 MG tablet TAKE 1 TABLET TWICE A DAY WITH MEALS 10/05/17   BoAnnitta NeedsNP    Family History Family History  Problem Relation Age of Onset  . Heart failure Mother   . Kidney cancer Mother   . Cancer Mother   . Hypertension Mother   . Lung cancer Father        Brain METS  . Cancer Father   . Cirrhosis Brother   . Kidney cancer Sister   . Cancer Sister   . Hypertension Sister   . Heart attack Neg Hx   . Stroke Neg Hx     Social History Social History   Tobacco Use  . Smoking status: Former Smoker    Packs/day: 0.50    Years: 40.00    Pack years: 20.00    Types: Cigarettes    Last attempt to quit: 03/30/2013    Years since quitting: 5.5  . Smokeless tobacco: Never Used  . Tobacco comment: Quit since 03/2013  Substance Use Topics  . Alcohol use: No    Alcohol/week: 0.0 standard drinks  . Drug use: No     Allergies   Aricept [donepezil hcl]; Namenda [memantine hcl]; Donepezil; Spironolactone; Codeine; Indocin [indomethacin]; Latex; and Penicillins   Review of Systems Review of Systems  All other systems reviewed and are negative.    Physical Exam Updated Vital Signs BP (!) 120/40 (BP Location: Right Arm)   Pulse 69   Temp  98.9 F (37.2 C) (Oral)   Resp 20   Wt 67.4 kg   SpO2 93%   BMI 25.51 kg/m   Physical Exam  Constitutional:  She is oriented to person, place, and time. She appears well-developed and well-nourished.  HENT:  Head: Normocephalic and atraumatic.  Eyes: Conjunctivae are normal.  Neck: Neck supple.  Musculoskeletal: Normal range of motion.  Neurological: She is alert and oriented to person, place, and time.  Skin:  Left index finger: 2.0 cm laceration on the volar aspect at the DIP joint.  Pain with range of motion.  Psychiatric: She has a normal mood and affect. Her behavior is normal.  Nursing note and vitals reviewed.    ED Treatments / Results  Labs (all labs ordered are listed, but only abnormal results are displayed) Labs Reviewed - No data to display  EKG None  Radiology Dg Finger Index Left  Result Date: 10/02/2018 CLINICAL DATA:  Laceration to left index finger. EXAM: LEFT INDEX FINGER 2+V COMPARISON:  None FINDINGS: Mild joint space narrowing in the IP joints of the visualized 2nd and 3rd fingers. No acute bony abnormality. Specifically, no fracture, subluxation, or dislocation. IMPRESSION: No acute bony abnormality. Electronically Signed   By: Rolm Baptise M.D.   On: 10/02/2018 17:06    Procedures .Marland KitchenLaceration Repair Date/Time: 10/02/2018 8:13 PM Performed by: Nat Christen, MD Authorized by: Nat Christen, MD   Consent:    Consent obtained:  Verbal   Consent given by:  Patient   Risks discussed:  Pain Anesthesia (see MAR for exact dosages):    Anesthesia method:  Local infiltration   Local anesthetic:  Lidocaine 2% w/o epi Laceration details:    Location:  Finger   Finger location:  L index finger   Wound length (cm): 2.0. Repair type:    Repair type:  Simple Pre-procedure details:    Preparation:  Patient was prepped and draped in usual sterile fashion Exploration:    Wound exploration: entire depth of wound probed and visualized     Contaminated: no     Treatment:    Area cleansed with:  Saline   Amount of cleaning:  Standard   Irrigation solution:  Sterile saline   Irrigation method:  Syringe   Visualized foreign bodies/material removed: no   Skin repair:    Repair method:  Sutures   Suture size:  4-0   Suture material:  Nylon Approximation:    Approximation:  Loose Post-procedure details:    Dressing:  Non-adherent dressing   (including critical care time)  Medications Ordered in ED Medications  lidocaine (XYLOCAINE) 2 % injection 10 mL (has no administration in time range)  Tdap (BOOSTRIX) 5-2.5-18.5 LF-MCG/0.5 injection (has no administration in time range)  lidocaine (XYLOCAINE) 2 % injection (has no administration in time range)  povidone-iodine (BETADINE) 10 % ointment (has no administration in time range)  povidone-iodine (BETADINE) 10 % external solution (has no administration in time range)  Tdap (BOOSTRIX) injection 0.5 mL (0.5 mLs Intramuscular Given 10/02/18 1629)     Initial Impression / Assessment and Plan / ED Course  I have reviewed the triage vital signs and the nursing notes.  Pertinent labs & imaging results that were available during my care of the patient were reviewed by me and considered in my medical decision making (see chart for details).     Laceration repair to left index finger.  Neurovascular intact.  Final Clinical Impressions(s) / ED Diagnoses   Final diagnoses:  Laceration of index finger without foreign body without damage to nail, unspecified laterality, initial encounter    ED Discharge Orders    None       Gu [place],  Aaron Edelman, MD 10/02/18 2015

## 2018-10-02 NOTE — ED Triage Notes (Addendum)
Pt reports that she was closing refrigerator door resulting in laceration to left first finger. Pt is currently on plavix. Area is still actively bleeding. Pressure dressing applied

## 2018-10-11 DIAGNOSIS — Z4802 Encounter for removal of sutures: Secondary | ICD-10-CM | POA: Diagnosis not present

## 2018-10-11 DIAGNOSIS — Z9981 Dependence on supplemental oxygen: Secondary | ICD-10-CM | POA: Diagnosis not present

## 2018-10-11 DIAGNOSIS — I272 Pulmonary hypertension, unspecified: Secondary | ICD-10-CM | POA: Diagnosis not present

## 2018-10-11 DIAGNOSIS — J449 Chronic obstructive pulmonary disease, unspecified: Secondary | ICD-10-CM | POA: Diagnosis not present

## 2018-10-15 ENCOUNTER — Other Ambulatory Visit: Payer: Self-pay | Admitting: Gastroenterology

## 2018-10-18 DIAGNOSIS — I270 Primary pulmonary hypertension: Secondary | ICD-10-CM | POA: Diagnosis not present

## 2018-10-21 ENCOUNTER — Encounter (INDEPENDENT_AMBULATORY_CARE_PROVIDER_SITE_OTHER): Payer: Self-pay | Admitting: Ophthalmology

## 2018-10-21 ENCOUNTER — Ambulatory Visit (INDEPENDENT_AMBULATORY_CARE_PROVIDER_SITE_OTHER): Payer: Medicare Other | Admitting: Ophthalmology

## 2018-10-21 DIAGNOSIS — H3581 Retinal edema: Secondary | ICD-10-CM

## 2018-10-21 DIAGNOSIS — H5462 Unqualified visual loss, left eye, normal vision right eye: Secondary | ICD-10-CM | POA: Diagnosis not present

## 2018-10-21 DIAGNOSIS — H43821 Vitreomacular adhesion, right eye: Secondary | ICD-10-CM | POA: Diagnosis not present

## 2018-10-21 DIAGNOSIS — H469 Unspecified optic neuritis: Secondary | ICD-10-CM | POA: Diagnosis not present

## 2018-10-21 DIAGNOSIS — H35033 Hypertensive retinopathy, bilateral: Secondary | ICD-10-CM | POA: Diagnosis not present

## 2018-10-21 DIAGNOSIS — Z961 Presence of intraocular lens: Secondary | ICD-10-CM

## 2018-10-21 DIAGNOSIS — I10 Essential (primary) hypertension: Secondary | ICD-10-CM | POA: Diagnosis not present

## 2018-10-21 DIAGNOSIS — E119 Type 2 diabetes mellitus without complications: Secondary | ICD-10-CM

## 2018-10-21 NOTE — Progress Notes (Addendum)
[facility] Note  10/21/2018     CHIEF COMPLAINT Patient presents for Retina Evaluation   HISTORY OF PRESENT ILLNESS: [name] is a 84 y.o. female who presents to the clinic today for:   HPI    Retina Evaluation    In left eye.  This started 6 weeks ago.  Duration of 6 weeks.  Associated Symptoms Floaters.  Negative for Flashes, Blind Spot, Photophobia, Scalp Tenderness, Fever, Pain, Glare, Jaw Claudication, Weight Loss, Distortion, Redness, Trauma, Shoulder/Hip pain and Fatigue.  Context:  near vision.  Treatments tried include laser.  Response to treatment: Improved at first then declined..  I, the attending physician,  performed the HPI with the patient and updated documentation appropriately.          Comments    Patient needs second opinion on decrease in vision OS. Had yag laser OS (08-25-2018) with Dr. Zadie Rhine. Vision improved for a couple of days post laser and then started to decline. BS was 85 this am. Last a1c was 6.2 on 08-22-2018. Also had virtreo-macular traction repair OD with Dr. Zadie Rhine on 01-19-2018. Patient reports no visual difficulties with OD.        Last edited by Bernarda Caffey, MD on 10/21/2018  2:52 PM. (History)    pt is accompanied by daughter today, who is her primary care giver, pt was at Dr. Kellie Moor office this morning, pt was referred to Dr. Zadie Rhine for VMT sx in March, pts daughter states after that she started noticing her vision in the left eye started to deteriorating, pt went back to Dr. Gershon Crane at that time and was told that her lens implant had a film on it, pt was told to go back to Dr. Zadie Rhine and tell him to look at it, pt states Dr. Zadie Rhine did the YAG cap on October 10th, pts daughter state that her vision was better for a couple days after that and then her vision started to deteriorate again, pts daughter states vision keeps getting worse, states pt is unable to read or make out faces, pts daughter states she was  told to go back and see Dr. Zadie Rhine, pt was told to see a doctor other than Dr. Gershon Crane or Dr. Zadie Rhine, pt is on oxygen due to COPD, daughter states that pt has nodules on lungs also, pt states doctors are unsure whether they are cancerous or not, pt has a history of heart attacks, pts daughter denies history of stroke, pt states she is diabetic, and on insulin, pt states the deterioration of vision was gradual, she states it has happened over the past 6 weeks or so  Referring physician: Rutherford Guys, MD [place], [place] [address]  HISTORICAL INFORMATION:   Selected notes from the [facility] Referred by Dr. 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 Glycerin-Polysorbate 80 (REFRESH DRY EYE THERAPY OP) Place 1 drop into both eyes 2 (two) times daily as needed.   No current facility-administered medications for this visit.  (Ophthalmic Drugs)   Current Outpatient Medications (Other)  Medication Sig  . acetaminophen (TYLENOL) 500 MG tablet Take 500 mg by mouth every 8 (eight) hours as needed for mild pain or moderate pain.  Marland Kitchen ALPRAZolam (XANAX) 0.25 MG tablet Take 0.25 mg by mouth 2 (two) times  daily as needed for anxiety.  Marland Kitchen atorvastatin (LIPITOR) 10 MG tablet Take 1 tablet (10 mg total) by mouth daily.  . cholecalciferol (VITAMIN D) 1000 UNITS tablet Take 1,000 Units by mouth every morning.   . citalopram (CELEXA) 20 MG tablet Take 1 tablet by mouth daily.  . clopidogrel (PLAVIX) 75 MG tablet Take 75 mg by mouth daily.  . clotrimazole-betamethasone (LOTRISONE) cream Apply 1 application topically 2 (two) times daily as needed (Eczema).   . docusate sodium (COLACE) 100 MG capsule Take 100 mg by mouth 2 (two) times daily.  . feeding supplement, GLUCERNA SHAKE, (GLUCERNA SHAKE) LIQD Take 237 mLs  by mouth daily as needed.   . furosemide (LASIX) 40 MG tablet Take 1.5 tablets (60 mg total) by mouth daily. (Patient taking differently: Take 40 mg by mouth daily. )  . guaiFENesin (MUCINEX) 600 MG 12 hr tablet Take 1 tablet (600 mg total) by mouth 2 (two) times daily as needed for cough or to loosen phlegm.  . hydrALAZINE (APRESOLINE) 25 MG tablet Take 1 tablet (25 mg total) by mouth 2 (two) times daily.  . insulin aspart (NOVOLOG FLEXPEN) 100 UNIT/ML FlexPen Inject 3 Units into the skin 3 (three) times daily with meals. When BG is over 100   . Insulin Glargine (LANTUS SOLOSTAR) 100 UNIT/ML Solostar Pen Inject 8 Units into the skin at bedtime.  Marland Kitchen loratadine (CLARITIN) 10 MG tablet Take 10 mg by mouth daily.  . Macitentan 10 MG TABS Take 10 mg by mouth daily.  . Methylfol-Algae-B12-Acetylcyst (CEREFOLIN NAC) 6-90.314-2-600 MG TABS Take 1 tablet by mouth daily.   . metoprolol (LOPRESSOR) 50 MG tablet Take 35m in the a.m.and 240min the pm  . mirtazapine (REMERON) 30 MG tablet Take 30 mg by mouth at bedtime.  . Multiple Vitamin (MULTIVITAMIN) tablet Take 1 tablet by mouth daily. Reported on 02/06/2016  . olmesartan (BENICAR) 20 MG tablet Take 5 mg by mouth daily.  . pantoprazole (PROTONIX) 40 MG tablet TAKE 1 TABLET TWICE A DAY BEFORE MEALS (Patient taking differently: Take 40 mg by mouth daily. )  . potassium chloride (K-DUR,KLOR-CON) 10 MEQ tablet Take 10 mEq by mouth 2 (two) times daily.   . psyllium (METAMUCIL SMOOTH TEXTURE) 28 % packet Take 1 packet by mouth 2 (two) times daily as needed.   . thyroid (ARMOUR) 15 MG tablet Take 15 mg by mouth daily.  . Marland Kitchenmeclidinium bromide (INCRUSE ELLIPTA) 62.5 MCG/INH AEPB Inhale 1 puff into the lungs daily.  . ursodiol (ACTIGALL) 500 MG tablet TAKE 1 TABLET TWICE A DAY WITH MEALS  . saccharomyces boulardii (FLORASTOR) 250 MG capsule Take 1 capsule (250 mg total) by mouth 2 (two) times daily. (Patient not taking: Reported on 10/21/2018)   No current  facility-administered medications for this visit.  (Other)      REVIEW OF SYSTEMS: ROS    Positive for: Endocrine, Cardiovascular, Eyes, Respiratory   Negative for: Constitutional, Gastrointestinal, Neurological, Skin, Genitourinary, Musculoskeletal, HENT, Psychiatric, Allergic/Imm, Heme/Lymph   Last edited by BaRoselee Nova on 10/21/2018  1:16 PM. (History)       ALLERGIES Allergies  Allergen Reactions  . Aricept [Donepezil Hcl]     Hives, vomiting and headache  . Namenda [Memantine Hcl]     hives  . Donepezil Nausea Only  . Spironolactone Other (See Comments)    Dizziness, balance problems  . Codeine Rash  . Indocin [Indomethacin] Rash  . Latex Rash  . Penicillins Rash    PAST MEDICAL HISTORY Past  Medical History:  Diagnosis Date  . Acute delirium 06/23/2013  . Acute diastolic congestive heart failure ([facility]) 03/21/2014  . Anxiety   . Cancer ([place])   . Carotid artery disease ([facility])    Bilateral ICA occlusion  . Chronic headaches   . Chronic liver disease    ?cirrhosis on 10/2014 CT. H/O elevated alkphos and +AMA on URSO.  Marland Kitchen Coronary atherosclerosis of native coronary artery    Mild nonobstructive disease at cardiac catheterization May 2011  . Depression   . Essential hypertension, benign   . Fracture of femoral neck, right, closed ([place]) 03/28/2014  . History of breast cancer   . History of GI bleed   . Hyperlipidemia   . Orthostatic hypotension    Diabetic polyneuropathy/diabetic dysautonomia   . Osteopenia   . Parotid tumor   . Pneumonia 11/2016  . Pneumonia   . Pressure ulcer, heel, right, unstageable ([place]) 04/17/2014  . PSVT (paroxysmal supraventricular tachycardia) ([place])   . Syncope and collapse 04/15/2010   Qualifier: Diagnosis of  By: Angelena Form, MD, Harrell Gave    . Type 2 diabetes mellitus ([facility])   . UPPER GASTROINTESTINAL HEMORRHAGE 03/21/2007   Qualifier: Diagnosis of  By: Jonna Munro MD, Roderic Scarce     Past Surgical History:  Procedure Laterality Date  .  ABDOMINAL HYSTERECTOMY    . APPENDECTOMY    . CATARACT EXTRACTION Bilateral    Dr. Gershon Crane  . CHOLECYSTECTOMY  2008  . COLONOSCOPY  2010   Dr. Oneida Alar: single tubular adenoma, one hyperplastic polyp. Next TCS 2020 health permitting.  . ESOPHAGOGASTRODUODENOSCOPY N/A 08/17/2016   Procedure: ESOPHAGOGASTRODUODENOSCOPY (EGD);  Surgeon: Danie Binder, MD;  [facility];  Service: Endoscopy;  Laterality: N/A;  8:45 am  . ESOPHAGOGASTRODUODENOSCOPY N/A 01/01/2017   Procedure: ESOPHAGOGASTRODUODENOSCOPY (EGD);  Surgeon: Danie Binder, MD;  Location: [facility];  Service: Endoscopy;  Laterality: N/A;  9:15 am  . HEMORRHOID SURGERY  1960s  . HIP ARTHROPLASTY Right 03/28/2014   Procedure: ARTHROPLASTY BIPOLAR HIP;  Surgeon: Carole Civil, MD;  Location: [facility];  Service: Orthopedics;  Laterality: Right;  . LAPAROSCOPIC APPENDECTOMY N/A 04/13/2013   Procedure: APPENDECTOMY LAPAROSCOPIC;  Surgeon: Donato Heinz, MD;  Location: [facility];  Service: General;  Laterality: N/A;  . MASTECTOMY  1994   Left breast -related to breast cancer  . RIGHT HEART CATH N/A 04/29/2017   Procedure: Right Heart Cath;  Surgeon: Adrian Prows, MD;  Location: [facility];  Service: Cardiovascular;  Laterality: N/A;  . RIGHT/LEFT HEART CATH AND CORONARY ANGIOGRAPHY N/A 04/13/2017   Procedure: RIGHT/LEFT HEART CATH AND CORONARY ANGIOGRAPHY;  Surgeon: Adrian Prows, MD;  Location: [facility];  Service: Cardiovascular;  Laterality: N/A;  . SAVORY DILATION N/A 08/17/2016   Procedure: SAVORY DILATION;  Surgeon: Danie Binder, MD;  [facility];  Service: Endoscopy;  Laterality: N/A;  . VESICOVAGINAL FISTULA CLOSURE W/ TAH  1979  . YAG LASER APPLICATION Left    Dr. Zadie Rhine    FAMILY HISTORY Family History  Problem Relation Age of Onset  . Heart failure Mother   . Kidney cancer Mother   . Cancer Mother   . Hypertension Mother   . Lung cancer Father        Brain METS  . Cancer Father   .  Cirrhosis Brother   . Kidney cancer Sister   . Cancer Sister   . Hypertension Sister   . Diabetes Son   . Heart attack Neg  Hx   . Stroke Neg Hx     SOCIAL HISTORY Social History   Tobacco Use  . Smoking status: Former Smoker    Packs/day: 0.50    Years: 40.00    Pack years: 20.00    Types: Cigarettes    Last attempt to quit: 03/30/2013    Years since quitting: 5.5  . Smokeless tobacco: Never Used  . Tobacco comment: Quit since 03/2013  Substance Use Topics  . Alcohol use: No    Alcohol/week: 0.0 standard drinks  . Drug use: No         OPHTHALMIC EXAM:  Base Eye Exam    Visual Acuity (Snellen - Linear)      Right Left   Dist cc 20/50 -1 20/200   Dist ph cc 20/40 +2 NI   Correction:  Glasses       Tonometry (Tonopen, 1:37 PM)      Right Left   Pressure 18 15       Pupils      Dark Light Shape React APD   Right 4 3 Round Brisk None   Left 7 6.5 Round Minimal +2  Pupil somewhat dilated OS but still slightly reactive. Had dilation gtts put in at Dr. Gershon Crane this am. Appears to be about 2+ APD OS on swinging light test.       Visual Fields (Counting fingers)      Left Right     Full   Restrictions Total superior temporal, inferior temporal deficiencies; Partial outer inferior nasal deficiency        Extraocular Movement      Right Left    Full, Ortho Full, Ortho       Neuro/Psych    Oriented x3:  Yes   Mood/Affect:  Normal       Dilation    Both eyes:  1.0% Mydriacyl, 2.5% Phenylephrine @ 1:37 PM        Slit Lamp and Fundus Exam    Slit Lamp Exam      Right Left   Lids/Lashes Dermatochalasis - upper lid Dermatochalasis - upper lid   Conjunctiva/Sclera nasal Pinguecula, Melanosis mild Melanosis   Cornea Arcus, early band K nasal and temporal, tear film debris, irregular epithelium, mild central corneal haze, 2+ Punctate epithelial erosions Arcus, 1+ Punctate epithelial erosions, Debris in tear film, central corneal haze   Anterior Chamber Deep  and quiet Deep and quiet   Iris Round and well dilated, No NVI Round and dilated, No NVI   Lens PC IOL in good position, trace Posterior capsular opacification PC IOL in good position with open PC   Vitreous clear, post vitrectomy        Fundus Exam      Right Left   Disc trace, temporal Pallor, sharp margin 2-3+ temporal pallor, sharp margin   C/D Ratio 0.4    Macula Good foveal reflex, mild Epiretinal membrane Good foveal reflex, No heme or edema   Vessels Tortuous, Vascular attenuation Vascular attenuation, Tortuous   Periphery Attached, patch of pigmented lattice at 0600, trace RPE changes    Attached        Refraction    Wearing Rx      Sphere Cylinder Axis Add   Right -1.25 +1.00 060 +2.50   Left -0.50 +0.50 152 +2.50       Manifest Refraction      Sphere Cylinder Axis Dist VA   Right -0.75 +1.25 030 20/40-2   Left -1.00 +1.25  155 20/100          IMAGING AND PROCEDURES  Imaging and Procedures for _0 @  OCT, Retina - OU - Both Eyes       Right Eye Quality was good. Central Foveal Thickness: 332. Progression has no prior data. Findings include abnormal foveal contour, no SRF, inner retinal atrophy, epiretinal membrane (Trace ERM, blunted foveal contour, trace cystic changes centrally, trace vitreous opacities).   Left Eye Quality was good. Central Foveal Thickness: 298. Progression has no prior data. Findings include normal foveal contour, no IRF, no SRF, epiretinal membrane (Partial PVD).   Notes *Images captured and stored on drive  Diagnosis / Impression:  OD: Trace ERM, blunted foveal contour, trace cystic changes centrally, trace vitreous opacities OS: NFP; no IRF/SRF; Partial PVD   Clinical management:  See below  Abbreviations: NFP - Normal foveal profile. CME - cystoid macular edema. PED - pigment epithelial detachment. IRF - intraretinal fluid. SRF - subretinal fluid. EZ - ellipsoid zone. ERM - epiretinal membrane. ORA - outer retinal atrophy.  ORT - outer retinal tubulation. SRHM - subretinal hyper-reflective material                 ASSESSMENT/PLAN:    ICD-10-CM   1. Optic neuropathy, left H46.9 MR ORBITS W WO CONTRAST  2. Diabetes mellitus type 2 without retinopathy ([place]) E11.9   3. Essential hypertension I10   4. Hypertensive retinopathy of both eyes H35.033   5. Vitreomacular adhesion of right eye H43.821   6. Retinal edema H35.81 OCT, Retina - OU - Both Eyes  7. Pseudophakia of both eyes Z96.1     1. Optic Neuropathy OS  - pt reports gradual decline in vision OS over last several months  - exam shows relatively normal exam of globe structures, but pt has +rAPD OS and pallor of disc OS ?atrophy -- +optic neuropathy, concern for retrobulbar mass or lesion affecting optic nerve  - discussed findings  - recommend MRI orbits w/wo gadolinium to r/o mass -- pt will need to schedule MRI at [facility]  - F/U 2 weeks -- may need referral to Neuro-ophthalmology +/- Neurology vs Neurosurgery  2. Diabetes mellitus, type 2 without retinopathy - The incidence, risk factors for progression, natural history and treatment options for diabetic retinopathy  were discussed with patient.   - The need for close monitoring of blood glucose, blood pressure, and serum lipids, avoiding cigarette or any type of tobacco, and the need for long term follow up was also discussed with patient. - f/u in 1 year, sooner prn  3,4. Hypertensive retinopathy OU - discussed importance of tight BP control - monitor  5,6. History of VMT OD - s/p PPV OD with [facility] March 2019 - today, no retinal edema on exam or OCT - right eye doing well  7. Pseudophakia OU  - s/p CE/IOL OU w/ Gershon Crane  - beautiful surgeries, doing well  - s/p YAG capsulotomy OS, Dr. Zadie Rhine, October 2019  - monitor  Ophthalmic Meds Ordered this visit:  No orders of the defined types were placed in this encounter.      Return in about 2 weeks (around 11/04/2018) for F/U  optic neuropathy OS.  There are no Patient Instructions on file for this visit.   Explained the diagnoses, plan, and follow up with the patient and they expressed understanding.  Patient expressed understanding of the importance of proper follow up care.   This document serves as a record of services personally performed  by Gardiner Sleeper, MD, PhD. It was created on their behalf by Ernest Mallick, OA, an ophthalmic assistant. The creation of this record is the provider's dictation and/or activities during the visit.    Electronically signed by: Ernest Mallick, OA  12.06.19 3:55 PM    Gardiner Sleeper, M.D., Ph.D. Diseases & Surgery of the Retina and Vitreous [facility]  I have reviewed the above documentation for accuracy and completeness, and I agree with the above. Gardiner Sleeper, M.D., Ph.D. 10/23/18 3:55 PM      Abbreviations: M myopia (nearsighted); A astigmatism; H hyperopia (farsighted); P presbyopia; Mrx spectacle prescription;  CTL contact lenses; OD right eye; OS left eye; OU both eyes  XT exotropia; ET esotropia; PEK punctate epithelial keratitis; PEE punctate epithelial erosions; DES dry eye syndrome; MGD meibomian gland dysfunction; ATs artificial tears; PFAT's preservative free artificial tears; [place] nuclear sclerotic cataract; PSC posterior subcapsular cataract; ERM epi-retinal membrane; PVD posterior vitreous detachment; RD retinal detachment; DM diabetes mellitus; DR diabetic retinopathy; NPDR non-proliferative diabetic retinopathy; PDR proliferative diabetic retinopathy; CSME clinically significant macular edema; DME diabetic macular edema; dbh dot blot hemorrhages; CWS cotton wool spot; POAG primary open angle glaucoma; C/D cup-to-disc ratio; HVF humphrey visual field; GVF goldmann visual field; OCT optical coherence tomography; IOP intraocular pressure; BRVO Branch retinal vein occlusion; CRVO central retinal vein occlusion; CRAO central retinal artery  occlusion; BRAO branch retinal artery occlusion; RT retinal tear; SB scleral buckle; PPV pars plana vitrectomy; VH Vitreous hemorrhage; PRP panretinal laser photocoagulation; IVK intravitreal kenalog; VMT vitreomacular traction; MH Macular hole;  NVD neovascularization of the disc; NVE neovascularization elsewhere; AREDS age related eye disease study; ARMD age related macular degeneration; POAG primary open angle glaucoma; EBMD epithelial/anterior basement membrane dystrophy; ACIOL anterior chamber intraocular lens; IOL intraocular lens; PCIOL posterior chamber intraocular lens; Phaco/IOL phacoemulsification with intraocular lens placement; [place] photorefractive keratectomy; LASIK laser assisted in situ keratomileusis; HTN hypertension; DM diabetes mellitus; COPD chronic obstructive pulmonary disease

## 2018-10-23 ENCOUNTER — Encounter (INDEPENDENT_AMBULATORY_CARE_PROVIDER_SITE_OTHER): Payer: Self-pay | Admitting: Ophthalmology

## 2018-10-26 ENCOUNTER — Ambulatory Visit (HOSPITAL_COMMUNITY)
Admission: RE | Admit: 2018-10-26 | Discharge: 2018-10-26 | Disposition: A | Discharge status: Home / Self Care | Payer: Medicare Other | Source: Ambulatory Visit | Attending: Ophthalmology | Admitting: Ophthalmology

## 2018-10-26 DIAGNOSIS — I6521 Occlusion and stenosis of right carotid artery: Secondary | ICD-10-CM | POA: Diagnosis not present

## 2018-10-26 DIAGNOSIS — H469 Unspecified optic neuritis: Secondary | ICD-10-CM | POA: Diagnosis not present

## 2018-10-26 DIAGNOSIS — H53132 Sudden visual loss, left eye: Secondary | ICD-10-CM | POA: Diagnosis not present

## 2018-10-26 LAB — POCT I-STAT CREATININE: Creatinine, Ser: 0.7 mg/dL (ref 0.44–1.00)

## 2018-10-26 MED ORDER — GADOBUTROL 1 MMOL/ML IV SOLN
7.0000 mL | Freq: Once | INTRAVENOUS | Status: AC | PRN
  Administered 2018-10-26: 7 mL via INTRAVENOUS

## 2018-10-31 DIAGNOSIS — Z0189 Encounter for other specified special examinations: Secondary | ICD-10-CM | POA: Diagnosis not present

## 2018-10-31 DIAGNOSIS — K7469 Other cirrhosis of liver: Secondary | ICD-10-CM | POA: Diagnosis not present

## 2018-10-31 DIAGNOSIS — R768 Other specified abnormal immunological findings in serum: Secondary | ICD-10-CM | POA: Diagnosis not present

## 2018-10-31 DIAGNOSIS — I270 Primary pulmonary hypertension: Secondary | ICD-10-CM | POA: Diagnosis not present

## 2018-11-03 NOTE — Progress Notes (Signed)
[name] Note  11/04/2018     CHIEF COMPLAINT Patient presents for Retina Follow Up   HISTORY OF PRESENT ILLNESS: [name] is a 84 y.o. female who presents to the clinic today for:   HPI    Retina Follow Up    Patient presents with  Other.  This started 2 weeks ago.  Severity is moderate.  Duration of 2 weeks.  Since onset it is gradually improving.  I, the attending physician,  performed the HPI with the patient and updated documentation appropriately.          Comments    Patient here for Retina evaluation for optic neuropathy. Patient stated vision a little better. Can see some things. Dr stopped [name] thinks vision will slowly come back. No tumors.        Last edited by Bernarda Caffey, MD on 11/04/2018  3:56 PM. (History)    pts daughter is with her here today, she states she left our office last time and went home and did some research on her mom's medications, she read that Adcirca can effect the vision so she called and got her an appt with Dr. Einar Gip -- who prescribed the med -- but pt stopped taking the medication on her own 2 days for her appt with him, pts daughter states she was supposed to take the medication every day, but it made her sick, so her daughter only gave it to her once every other day, her daughter states she started taking it every day over the summer and that is when she started complaining about her vision, pt states she has been off the medication for about 3-4 days and her vision has improved during that time  Referring physician: Rutherford Guys, MD [place], [place] [id number]  HISTORICAL INFORMATION:   Selected notes from the [place] Referred by Dr. 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  Glycerin-Polysorbate 80 (REFRESH DRY EYE THERAPY OP) Place 1 drop into both eyes 2 (two) times daily as needed.   No current facility-administered medications for this visit.  (Ophthalmic Drugs)   Current Outpatient Medications (Other)  Medication Sig  . acetaminophen (TYLENOL) 500 MG tablet Take 500 mg by mouth every 8 (eight) hours as needed for mild pain or moderate pain.  Marland Kitchen ALPRAZolam (XANAX) 0.25 MG tablet Take 0.25 mg by mouth 2 (two) times daily as needed for anxiety.  Marland Kitchen atorvastatin (LIPITOR) 10 MG tablet Take 1 tablet (10 mg total) by mouth daily.  . cholecalciferol (VITAMIN D) 1000 UNITS tablet Take 1,000 Units by mouth every morning.   . citalopram (CELEXA) 20 MG tablet Take 1 tablet by mouth daily.  . clopidogrel (PLAVIX) 75 MG tablet Take 75 mg by mouth daily.  . clotrimazole-betamethasone (LOTRISONE) cream Apply 1 application topically 2 (two) times daily as needed (Eczema).   . docusate sodium (COLACE) 100 MG capsule Take 100 mg by mouth 2 (two) times daily.  . feeding supplement, GLUCERNA SHAKE, (GLUCERNA SHAKE) LIQD Take 237 mLs by mouth daily as needed.   . furosemide (LASIX) 40 MG tablet Take 1.5 tablets (60 mg total) by mouth daily. (Patient taking differently: Take 40 mg by mouth daily. )  . guaiFENesin (MUCINEX) 600 MG 12 hr tablet Take 1 tablet (600  mg total) by mouth 2 (two) times daily as needed for cough or to loosen phlegm.  . hydrALAZINE (APRESOLINE) 25 MG tablet Take 1 tablet (25 mg total) by mouth 2 (two) times daily.  . insulin aspart (NOVOLOG FLEXPEN) 100 UNIT/ML FlexPen Inject 3 Units into the skin 3 (three) times daily with meals. When BG is over 100   . Insulin Glargine (LANTUS SOLOSTAR) 100 UNIT/ML Solostar Pen Inject 8 Units into the skin at bedtime.  Marland Kitchen loratadine (CLARITIN) 10 MG tablet Take 10 mg by mouth daily.  . Macitentan 10 MG TABS Take 10 mg by mouth daily.  . Methylfol-Algae-B12-Acetylcyst (CEREFOLIN NAC) 6-90.314-2-600 MG TABS Take 1 tablet by  mouth daily.   . metoprolol (LOPRESSOR) 50 MG tablet Take 63m in the a.m.and 288min the pm  . mirtazapine (REMERON) 30 MG tablet Take 30 mg by mouth at bedtime.  . Multiple Vitamin (MULTIVITAMIN) tablet Take 1 tablet by mouth daily. Reported on 02/06/2016  . olmesartan (BENICAR) 20 MG tablet Take 5 mg by mouth daily.  . pantoprazole (PROTONIX) 40 MG tablet TAKE 1 TABLET TWICE A DAY BEFORE MEALS (Patient taking differently: Take 40 mg by mouth daily. )  . potassium chloride (K-DUR,KLOR-CON) 10 MEQ tablet Take 10 mEq by mouth 2 (two) times daily.   . psyllium (METAMUCIL SMOOTH TEXTURE) 28 % packet Take 1 packet by mouth 2 (two) times daily as needed.   . saccharomyces boulardii (FLORASTOR) 250 MG capsule Take 1 capsule (250 mg total) by mouth 2 (two) times daily. (Patient not taking: Reported on 10/21/2018)  . thyroid (ARMOUR) 15 MG tablet Take 15 mg by mouth daily.  . Marland Kitchenmeclidinium bromide (INCRUSE ELLIPTA) 62.5 MCG/INH AEPB Inhale 1 puff into the lungs daily.  . ursodiol (ACTIGALL) 500 MG tablet TAKE 1 TABLET TWICE A DAY WITH MEALS   No current facility-administered medications for this visit.  (Other)      REVIEW OF SYSTEMS: ROS    Positive for: Endocrine, Cardiovascular, Eyes, Respiratory   Negative for: Constitutional, Gastrointestinal, Neurological, Skin, Genitourinary, Musculoskeletal, HENT, Psychiatric, Allergic/Imm, Heme/Lymph   Last edited by ZaBernarda CaffeyMD on 11/06/2018  1:08 AM. (History)       ALLERGIES Allergies  Allergen Reactions  . Aricept [Donepezil Hcl]     Hives, vomiting and headache  . Namenda [Memantine Hcl]     hives  . Donepezil Nausea Only  . Spironolactone Other (See Comments)    Dizziness, balance problems  . Codeine Rash  . Indocin [Indomethacin] Rash  . Latex Rash  . Penicillins Rash    PAST MEDICAL HISTORY Past Medical History:  Diagnosis Date  . Acute delirium 06/23/2013  . Acute diastolic congestive heart failure ([facility]5/04/2014  . Anxiety    . Cancer ([place]  . Carotid artery disease (HCC)    Bilateral ICA occlusion  . Chronic headaches   . Chronic liver disease    ?cirrhosis on 10/2014 CT. H/O elevated alkphos and +AMA on URSO.  . Marland Kitchenoronary atherosclerosis of native coronary artery    Mild nonobstructive disease at cardiac catheterization May 2011  . Depression   . Essential hypertension, benign   . Fracture of femoral neck, right, closed ([facility]5/13/2015  . History of breast cancer   . History of GI bleed   . Hyperlipidemia   . Orthostatic hypotension    Diabetic polyneuropathy/diabetic dysautonomia   . Osteopenia   . Parotid tumor   . Pneumonia 11/2016  . Pneumonia   . Pressure ulcer, heel,  right, unstageable ([place]) 04/17/2014  . PSVT (paroxysmal supraventricular tachycardia) ([place])   . Syncope and collapse 04/15/2010   Qualifier: Diagnosis of  By: Angelena Form, MD, Harrell Gave    . Type 2 diabetes mellitus ([place])   . UPPER GASTROINTESTINAL HEMORRHAGE 03/21/2007   Qualifier: Diagnosis of  By: Jonna Munro MD, Roderic Scarce     Past Surgical History:  Procedure Laterality Date  . ABDOMINAL HYSTERECTOMY    . APPENDECTOMY    . CATARACT EXTRACTION Bilateral    Dr. Gershon Crane  . CHOLECYSTECTOMY  2008  . COLONOSCOPY  2010   Dr. Oneida Alar: single tubular adenoma, one hyperplastic polyp. Next TCS 2020 health permitting.  . ESOPHAGOGASTRODUODENOSCOPY N/A 08/17/2016   Procedure: ESOPHAGOGASTRODUODENOSCOPY (EGD);  Surgeon: Danie Binder, MD;  [facility];  Service: Endoscopy;  Laterality: N/A;  8:45 am  . ESOPHAGOGASTRODUODENOSCOPY N/A 01/01/2017   Procedure: ESOPHAGOGASTRODUODENOSCOPY (EGD);  Surgeon: Danie Binder, MD;  Location: [facility];  Service: Endoscopy;  Laterality: N/A;  9:15 am  . HEMORRHOID SURGERY  1960s  . HIP ARTHROPLASTY Right 03/28/2014   Procedure: ARTHROPLASTY BIPOLAR HIP;  Surgeon: Carole Civil, MD;  Location: [facility];  Service: Orthopedics;  Laterality: Right;  . LAPAROSCOPIC APPENDECTOMY N/A 04/13/2013    Procedure: APPENDECTOMY LAPAROSCOPIC;  Surgeon: Donato Heinz, MD;  Location: [facility];  Service: General;  Laterality: N/A;  . MASTECTOMY  1994   Left breast -related to breast cancer  . RIGHT HEART CATH N/A 04/29/2017   Procedure: Right Heart Cath;  Surgeon: Adrian Prows, MD;  Location: [facility];  Service: Cardiovascular;  Laterality: N/A;  . RIGHT/LEFT HEART CATH AND CORONARY ANGIOGRAPHY N/A 04/13/2017   Procedure: RIGHT/LEFT HEART CATH AND CORONARY ANGIOGRAPHY;  Surgeon: Adrian Prows, MD;  Location: [facility];  Service: Cardiovascular;  Laterality: N/A;  . SAVORY DILATION N/A 08/17/2016   Procedure: SAVORY DILATION;  Surgeon: Danie Binder, MD;  [facility];  Service: Endoscopy;  Laterality: N/A;  . VESICOVAGINAL FISTULA CLOSURE W/ TAH  1979  . YAG LASER APPLICATION Left    Dr. Zadie Rhine    FAMILY HISTORY Family History  Problem Relation Age of Onset  . Heart failure Mother   . Kidney cancer Mother   . Cancer Mother   . Hypertension Mother   . Lung cancer Father        Brain METS  . Cancer Father   . Cirrhosis Brother   . Kidney cancer Sister   . Cancer Sister   . Hypertension Sister   . Diabetes Son   . Heart attack Neg Hx   . Stroke Neg Hx     SOCIAL HISTORY Social History   Tobacco Use  . Smoking status: Former Smoker    Packs/day: 0.50    Years: 40.00    Pack years: 20.00    Types: Cigarettes    Last attempt to quit: 03/30/2013    Years since quitting: 5.6  . Smokeless tobacco: Never Used  . Tobacco comment: Quit since 03/2013  Substance Use Topics  . Alcohol use: No    Alcohol/week: 0.0 standard drinks  . Drug use: No         OPHTHALMIC EXAM:  Base Eye Exam    Visual Acuity (Snellen - Linear)      Right Left   Dist cc 20/40 -1 20/80 -2   Dist ph cc NI 20/70 -2   Correction:  Glasses       Tonometry (Tonopen, 1:57 PM)  Right Left   Pressure 18 18       Pupils      Dark Light Shape React APD   Right 3 2 Round  Brisk -   Left 3 3 Round nonreactive 3+       Visual Fields      Left Right     Full   Restrictions Total superior temporal, inferior temporal deficiencies; Partial outer inferior nasal deficiency        Extraocular Movement      Right Left    Full, Ortho Full, Ortho       Neuro/Psych    Oriented x3:  Yes   Mood/Affect:  Normal       Dilation    Both eyes:  1.0% Mydriacyl, 2.5% Phenylephrine @ 1:57 PM        Slit Lamp and Fundus Exam    Slit Lamp Exam      Right Left   Lids/Lashes Dermatochalasis - upper lid Dermatochalasis - upper lid   Conjunctiva/Sclera nasal Pinguecula, Melanosis mild Melanosis   Cornea Arcus, early band K nasal and temporal, tear film debris, irregular epithelium, mild central corneal haze, 2+ Punctate epithelial erosions Arcus, 1+ Punctate epithelial erosions, Debris in tear film, central corneal haze   Anterior Chamber Deep and quiet Deep and quiet   Iris Round and well dilated, No NVI Round and dilated, No NVI   Lens PC IOL in good position, trace Posterior capsular opacification PC IOL in good position with open PC   Vitreous clear, post vitrectomy        Fundus Exam      Right Left   Disc trace, 1-2+ temporal Pallor, sharp margin 3+ temporal pallor, temporal Peripapillary atrophy, Sharp rim   C/D Ratio 0.4 0.3   Macula Flat, Good foveal reflex, mild Epiretinal membrane, Retinal pigment epithelial mottling, No heme or edema Good foveal reflex, No heme or edema   Vessels Tortuous, Vascular attenuation, AV crossing changes Vascular attenuation, Tortuous   Periphery Attached, patch of pigmented lattice at 0600, trace RPE changes    Attached        Refraction    Wearing Rx      Sphere Cylinder Axis Add   Right -1.25 +1.00 060 +2.50   Left -0.50 +0.50 152 +2.50          IMAGING AND PROCEDURES  Imaging and Procedures for _0 @  OCT, Retina - OU - Both Eyes       Right Eye Quality was good. Central Foveal Thickness: 330.  Progression has been stable. Findings include abnormal foveal contour, no SRF, inner retinal atrophy, epiretinal membrane (Trace ERM, blunted foveal contour, trace cystic changes centrally, trace vitreous opacities).   Left Eye Quality was good. Central Foveal Thickness: 297. Progression has been stable. Findings include normal foveal contour, no IRF, no SRF, epiretinal membrane (Partial PVD).   Notes *Images captured and stored on drive  Diagnosis / Impression:  OD: Trace ERM, blunted foveal contour, trace cystic changes centrally, trace vitreous opacities OS: NFP; no IRF/SRF; Partial PVD   Clinical management:  See below  Abbreviations: NFP - Normal foveal profile. CME - cystoid macular edema. PED - pigment epithelial detachment. IRF - intraretinal fluid. SRF - subretinal fluid. EZ - ellipsoid zone. ERM - epiretinal membrane. ORA - outer retinal atrophy. ORT - outer retinal tubulation. SRHM - subretinal hyper-reflective material                 ASSESSMENT/PLAN:  ICD-10-CM   1. Optic neuropathy, left H46.9   2. Diabetes mellitus type 2 without retinopathy ([place]) E11.9   3. Essential hypertension I10   4. Hypertensive retinopathy of both eyes H35.033   5. Vitreomacular adhesion of right eye H43.821   6. Retinal edema H35.81 OCT, Retina - OU - Both Eyes  7. Pseudophakia of both eyes Z96.1     1. Optic Neuropathy OS  - pt reports gradual decline in vision OS over last several months  - initial exam showed relatively normal exam of globe structures, but pt has +rAPD OS and pallor of disc OS ?atrophy -- +optic neuropathy, concern for retrobulbar mass or lesion affecting optic nerve  - MRI orbits w/wo gadolinium done 12.11.19 -- normal study   "No mass or inflammatory process of the orbital compartments or visible optic pathways. No abnormal signal or enhancement of the optic nerves is identified.  - pt and daughter correlated symptoms with starting new medication for  Pulmonary arterial hypertension, Tadalafil  - Cardiologist also suspects medication may be contributing to decreased vision  - pt has since stopped tadalafil and vision has improved subjectively and objectively-- today BCVA 20/70 from 20/200 OS  - pt to continue holding tadalafil  - will refer to [facility] Frederico [facility] or [facility] Hassell [facility] for further Neuro-ophth eval  - F/U here in 4 weeks   2. Diabetes mellitus, type 2 without retinopathy - The incidence, risk factors for progression, natural history and treatment options for diabetic retinopathy  were discussed with patient.   - The need for close monitoring of blood glucose, blood pressure, and serum lipids, avoiding cigarette or any type of tobacco, and the need for long term follow up was also discussed with patient. - f/u in 1 year, sooner prn  3,4. Hypertensive retinopathy OU - discussed importance of tight BP control - monitor  5,6. History of VMT OD - s/p PPV OD with Rankin March 2019 - today, no retinal edema on exam or OCT - right eye doing well  7. Pseudophakia OU  - s/p CE/IOL OU w/ Gershon Crane  - beautiful surgeries, doing well  - s/p YAG capsulotomy OS, Dr. Zadie Rhine, October 2019  - monitor  Ophthalmic Meds Ordered this visit:  No orders of the defined types were placed in this encounter.      Return in about 4 weeks (around 12/02/2018) for F/U optic neuropathy, DFE, OCT.  There are no Patient Instructions on file for this visit.   Explained the diagnoses, plan, and follow up with the patient and they expressed understanding.  Patient expressed understanding of the importance of proper follow up care.   This document serves as a record of services personally performed by Gardiner Sleeper, MD, PhD. It was created on their behalf by Ernest Mallick, OA, an ophthalmic assistant. The creation of this record is the provider's dictation and/or activities during the visit.    Electronically signed by: Ernest Mallick, OA   12.19.19 1:19 AM    Gardiner Sleeper, M.D., Ph.D. Diseases & Surgery of the Retina and Vitreous [facility]  I have reviewed the above documentation for accuracy and completeness, and I agree with the above. Gardiner Sleeper, M.D., Ph.D. 11/06/18 1:19 AM   Abbreviations: M myopia (nearsighted); A astigmatism; H hyperopia (farsighted); P presbyopia; Mrx spectacle prescription;  CTL contact lenses; OD right eye; OS left eye; OU both eyes  XT exotropia; ET esotropia; PEK punctate epithelial keratitis;  PEE punctate epithelial erosions; DES dry eye syndrome; MGD meibomian gland dysfunction; ATs artificial tears; PFAT's preservative free artificial tears; [place] nuclear sclerotic cataract; PSC posterior subcapsular cataract; ERM epi-retinal membrane; PVD posterior vitreous detachment; RD retinal detachment; DM diabetes mellitus; DR diabetic retinopathy; NPDR non-proliferative diabetic retinopathy; PDR proliferative diabetic retinopathy; CSME clinically significant macular edema; DME diabetic macular edema; dbh dot blot hemorrhages; CWS cotton wool spot; POAG primary open angle glaucoma; C/D cup-to-disc ratio; HVF humphrey visual field; GVF goldmann visual field; OCT optical coherence tomography; IOP intraocular pressure; BRVO Branch retinal vein occlusion; CRVO central retinal vein occlusion; CRAO central retinal artery occlusion; BRAO branch retinal artery occlusion; RT retinal tear; SB scleral buckle; PPV pars plana vitrectomy; VH Vitreous hemorrhage; PRP panretinal laser photocoagulation; IVK intravitreal kenalog; VMT vitreomacular traction; MH Macular hole;  NVD neovascularization of the disc; NVE neovascularization elsewhere; AREDS age related eye disease study; ARMD age related macular degeneration; POAG primary open angle glaucoma; EBMD epithelial/anterior basement membrane dystrophy; ACIOL anterior chamber intraocular lens; IOL intraocular lens; PCIOL posterior chamber intraocular  lens; Phaco/IOL phacoemulsification with intraocular lens placement; [place] photorefractive keratectomy; LASIK laser assisted in situ keratomileusis; HTN hypertension; DM diabetes mellitus; COPD chronic obstructive pulmonary disease

## 2018-11-04 ENCOUNTER — Ambulatory Visit (INDEPENDENT_AMBULATORY_CARE_PROVIDER_SITE_OTHER): Payer: Medicare Other | Admitting: Ophthalmology

## 2018-11-04 DIAGNOSIS — H35033 Hypertensive retinopathy, bilateral: Secondary | ICD-10-CM

## 2018-11-04 DIAGNOSIS — H3581 Retinal edema: Secondary | ICD-10-CM | POA: Diagnosis not present

## 2018-11-04 DIAGNOSIS — I10 Essential (primary) hypertension: Secondary | ICD-10-CM

## 2018-11-04 DIAGNOSIS — H43821 Vitreomacular adhesion, right eye: Secondary | ICD-10-CM

## 2018-11-04 DIAGNOSIS — H469 Unspecified optic neuritis: Secondary | ICD-10-CM | POA: Diagnosis not present

## 2018-11-04 DIAGNOSIS — Z961 Presence of intraocular lens: Secondary | ICD-10-CM | POA: Diagnosis not present

## 2018-11-04 DIAGNOSIS — E119 Type 2 diabetes mellitus without complications: Secondary | ICD-10-CM | POA: Diagnosis not present

## 2018-11-06 ENCOUNTER — Encounter (INDEPENDENT_AMBULATORY_CARE_PROVIDER_SITE_OTHER): Payer: Self-pay | Admitting: Ophthalmology

## 2018-11-08 ENCOUNTER — Telehealth: Payer: Self-pay | Admitting: Gastroenterology

## 2018-11-08 NOTE — Telephone Encounter (Signed)
Recall for ultrasound

## 2018-11-08 NOTE — Telephone Encounter (Signed)
Letter mailed

## 2018-11-23 DIAGNOSIS — I2510 Atherosclerotic heart disease of native coronary artery without angina pectoris: Secondary | ICD-10-CM | POA: Diagnosis not present

## 2018-11-23 DIAGNOSIS — J9621 Acute and chronic respiratory failure with hypoxia: Secondary | ICD-10-CM | POA: Diagnosis not present

## 2018-11-23 DIAGNOSIS — I110 Hypertensive heart disease with heart failure: Secondary | ICD-10-CM | POA: Diagnosis not present

## 2018-11-23 DIAGNOSIS — I5032 Chronic diastolic (congestive) heart failure: Secondary | ICD-10-CM | POA: Diagnosis not present

## 2018-12-01 DIAGNOSIS — R918 Other nonspecific abnormal finding of lung field: Secondary | ICD-10-CM | POA: Diagnosis not present

## 2018-12-01 DIAGNOSIS — J189 Pneumonia, unspecified organism: Secondary | ICD-10-CM | POA: Diagnosis not present

## 2018-12-01 DIAGNOSIS — J439 Emphysema, unspecified: Secondary | ICD-10-CM | POA: Diagnosis not present

## 2018-12-02 ENCOUNTER — Encounter (INDEPENDENT_AMBULATORY_CARE_PROVIDER_SITE_OTHER): Payer: Self-pay | Admitting: Ophthalmology

## 2018-12-02 ENCOUNTER — Ambulatory Visit (INDEPENDENT_AMBULATORY_CARE_PROVIDER_SITE_OTHER): Payer: Medicare Other | Admitting: Ophthalmology

## 2018-12-02 DIAGNOSIS — Z961 Presence of intraocular lens: Secondary | ICD-10-CM | POA: Diagnosis not present

## 2018-12-02 DIAGNOSIS — I10 Essential (primary) hypertension: Secondary | ICD-10-CM

## 2018-12-02 DIAGNOSIS — E119 Type 2 diabetes mellitus without complications: Secondary | ICD-10-CM | POA: Diagnosis not present

## 2018-12-02 DIAGNOSIS — H469 Unspecified optic neuritis: Secondary | ICD-10-CM | POA: Diagnosis not present

## 2018-12-02 DIAGNOSIS — H35033 Hypertensive retinopathy, bilateral: Secondary | ICD-10-CM | POA: Diagnosis not present

## 2018-12-02 DIAGNOSIS — H3581 Retinal edema: Secondary | ICD-10-CM | POA: Diagnosis not present

## 2018-12-02 DIAGNOSIS — H43821 Vitreomacular adhesion, right eye: Secondary | ICD-10-CM | POA: Diagnosis not present

## 2018-12-02 NOTE — Progress Notes (Signed)
[facility] Note  12/02/2018     CHIEF COMPLAINT Patient presents for Retina Follow Up   HISTORY OF PRESENT ILLNESS: [name] is a 85 y.o. female who presents to the clinic today for:   HPI    Retina Follow Up    Patient presents with  Other.  In left eye.  This started 1 month ago.  Severity is mild.  Since onset it is stable.  I, the attending physician,  performed the HPI with the patient and updated documentation appropriately.          Comments    F/U Optic neuropathy OS. Patient states she has occasional black specks, denies flashes and ocular pain. BS 133 this am , A1C 6.2. Denies Gtt's        Last edited by Bernarda Caffey, MD on 12/02/2018  2:41 PM. (History)    pts daughter states pt is still off Tadalafil, pts daughter states her doctor did not try and replace the medication with anything else, pt states she can tell her vision has gotten worse again, but daughter states she has been asking her about her vision at home and she always says it's the same   Referring physician: [name], [address], [place] [address]  HISTORICAL INFORMATION:   Selected notes from the [facility] Referred by Dr. 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 Glycerin-Polysorbate 80 (REFRESH DRY EYE THERAPY OP) Place 1 drop into both eyes 2 (two) times daily as needed.   No current facility-administered medications for this visit.  (Ophthalmic Drugs)   Current Outpatient Medications (Other)  Medication Sig  . acetaminophen (TYLENOL) 500 MG tablet Take 500 mg by mouth every 8 (eight) hours as needed for mild pain or moderate pain.  Marland Kitchen ALPRAZolam (XANAX) 0.25 MG tablet Take 0.25 mg by mouth 2 (two) times daily as needed for anxiety.  Marland Kitchen  atorvastatin (LIPITOR) 10 MG tablet Take 1 tablet (10 mg total) by mouth daily.  . cholecalciferol (VITAMIN D) 1000 UNITS tablet Take 1,000 Units by mouth every morning.   . citalopram (CELEXA) 20 MG tablet Take 1 tablet by mouth daily.  . clopidogrel (PLAVIX) 75 MG tablet Take 75 mg by mouth daily.  . clotrimazole-betamethasone (LOTRISONE) cream Apply 1 application topically 2 (two) times daily as needed (Eczema).   . docusate sodium (COLACE) 100 MG capsule Take 100 mg by mouth 2 (two) times daily.  . feeding supplement, GLUCERNA SHAKE, (GLUCERNA SHAKE) LIQD Take 237 mLs by mouth daily as needed.   . furosemide (LASIX) 40 MG tablet Take 1.5 tablets (60 mg total) by mouth daily. (Patient taking differently: Take 40 mg by mouth daily. )  . guaiFENesin (MUCINEX) 600 MG 12 hr tablet Take 1 tablet (600 mg total) by mouth 2 (two) times daily as needed for cough or to loosen phlegm.  . hydrALAZINE (APRESOLINE) 25 MG tablet Take 1 tablet (25 mg total) by mouth 2 (two) times daily.  . insulin aspart (NOVOLOG FLEXPEN) 100 UNIT/ML FlexPen Inject 3 Units into the skin 3 (three) times daily with meals. When BG is over 100   . Insulin Glargine (LANTUS SOLOSTAR) 100 UNIT/ML Solostar Pen Inject 8 Units into the skin at bedtime.  Marland Kitchen loratadine (CLARITIN) 10 MG tablet Take  10 mg by mouth daily.  . Macitentan 10 MG TABS Take 10 mg by mouth daily.  . Methylfol-Algae-B12-Acetylcyst (CEREFOLIN NAC) 6-90.314-2-600 MG TABS Take 1 tablet by mouth daily.   . metoprolol (LOPRESSOR) 50 MG tablet Take 25m in the a.m.and 293min the pm  . mirtazapine (REMERON) 30 MG tablet Take 30 mg by mouth at bedtime.  . Multiple Vitamin (MULTIVITAMIN) tablet Take 1 tablet by mouth daily. Reported on 02/06/2016  . olmesartan (BENICAR) 20 MG tablet Take 5 mg by mouth daily.  . pantoprazole (PROTONIX) 40 MG tablet TAKE 1 TABLET TWICE A DAY BEFORE MEALS (Patient taking differently: Take 40 mg by mouth daily. )  . potassium chloride  (K-DUR,KLOR-CON) 10 MEQ tablet Take 10 mEq by mouth 2 (two) times daily.   . psyllium (METAMUCIL SMOOTH TEXTURE) 28 % packet Take 1 packet by mouth 2 (two) times daily as needed.   . saccharomyces boulardii (FLORASTOR) 250 MG capsule Take 1 capsule (250 mg total) by mouth 2 (two) times daily. (Patient not taking: Reported on 10/21/2018)  . thyroid (ARMOUR) 15 MG tablet Take 15 mg by mouth daily.  . Marland Kitchenmeclidinium bromide (INCRUSE ELLIPTA) 62.5 MCG/INH AEPB Inhale 1 puff into the lungs daily.  . ursodiol (ACTIGALL) 500 MG tablet TAKE 1 TABLET TWICE A DAY WITH MEALS   No current facility-administered medications for this visit.  (Other)      REVIEW OF SYSTEMS: ROS    Positive for: Cardiovascular, Eyes, Respiratory   Negative for: Constitutional, Gastrointestinal, Neurological, Skin, Genitourinary, Musculoskeletal, HENT, Endocrine, Psychiatric, Allergic/Imm, Heme/Lymph   Last edited by KeZenovia JordanLPN on 11/22/27/52842:1:32M. (History)       ALLERGIES Allergies  Allergen Reactions  . Aricept [Donepezil Hcl]     Hives, vomiting and headache  . Namenda [Memantine Hcl]     hives  . Donepezil Nausea Only  . Spironolactone Other (See Comments)    Dizziness, balance problems  . Codeine Rash  . Indocin [Indomethacin] Rash  . Latex Rash  . Penicillins Rash    PAST MEDICAL HISTORY Past Medical History:  Diagnosis Date  . Acute delirium 06/23/2013  . Acute diastolic congestive heart failure ([facility]CMari[facility]5/04/2014  . Anxiety   . Cancer (HCMatl[place]  . Carotid artery disease (HCC)    Bilateral ICA occlusion  . Chronic headaches   . Chronic liver disease    ?cirrhosis on 10/2014 CT. H/O elevated alkphos and +AMA on URSO.  . Marland Kitchenoronary atherosclerosis of native coronary artery    Mild nonobstructive disease at cardiac catheterization May 2011  . Depression   . Essential hypertension, benign   . Fracture of femoral neck, right, closed ([place]CG[place]ret[place]5/13/2015  . History of breast cancer   . History of GI  bleed   . Hyperlipidemia   . Orthostatic hypotension    Diabetic polyneuropathy/diabetic dysautonomia   . Osteopenia   . Parotid tumor   . Pneumonia 11/2016  . Pneumonia   . Pressure ulcer, heel, right, unstageable (HCCamer[place]6/12/2013  . PSVT (paroxysmal supraventricular tachycardia) (HCHydab[place]  . Syncope and collapse 04/15/2010   Qualifier: Diagnosis of  By: McAngelena FormMD, ChHarrell Gave  . Type 2 diabetes mellitus (HCShenor[place]  . UPPER GASTROINTESTINAL HEMORRHAGE 03/21/2007   Qualifier: Diagnosis of  By: FeJonna MunroD, CoRoderic Scarce   Past Surgical History:  Procedure Laterality Date  . ABDOMINAL HYSTERECTOMY    . APPENDECTOMY    . CATARACT EXTRACTION Bilateral    Dr. ShGershon Crane.  CHOLECYSTECTOMY  2008  . COLONOSCOPY  2010   Dr. Oneida Alar: single tubular adenoma, one hyperplastic polyp. Next TCS 2020 health permitting.  . ESOPHAGOGASTRODUODENOSCOPY N/A 08/17/2016   Procedure: ESOPHAGOGASTRODUODENOSCOPY (EGD);  Surgeon: Danie Binder, MD;  [facility];  Service: Endoscopy;  Laterality: N/A;  8:45 am  . ESOPHAGOGASTRODUODENOSCOPY N/A 01/01/2017   Procedure: ESOPHAGOGASTRODUODENOSCOPY (EGD);  Surgeon: Danie Binder, MD;  [facility];  Service: Endoscopy;  Laterality: N/A;  9:15 am  . HEMORRHOID SURGERY  1960s  . HIP ARTHROPLASTY Right 03/28/2014   Procedure: ARTHROPLASTY BIPOLAR HIP;  Surgeon: Carole Civil, MD;  Location: [facility];  Service: Orthopedics;  Laterality: Right;  . LAPAROSCOPIC APPENDECTOMY N/A 04/13/2013   Procedure: APPENDECTOMY LAPAROSCOPIC;  Surgeon: Donato Heinz, MD;  Location: [facility];  Service: General;  Laterality: N/A;  . MASTECTOMY  1994   Left breast -related to breast cancer  . RIGHT HEART CATH N/A 04/29/2017   Procedure: Right Heart Cath;  Surgeon: Adrian Prows, MD;  Location: [facility];  Service: Cardiovascular;  Laterality: N/A;  . RIGHT/LEFT HEART CATH AND CORONARY ANGIOGRAPHY N/A 04/13/2017   Procedure: RIGHT/LEFT HEART CATH AND CORONARY  ANGIOGRAPHY;  Surgeon: Adrian Prows, MD;  Location: [facility];  Service: Cardiovascular;  Laterality: N/A;  . SAVORY DILATION N/A 08/17/2016   Procedure: SAVORY DILATION;  Surgeon: Danie Binder, MD;  [facility];  Service: Endoscopy;  Laterality: N/A;  . VESICOVAGINAL FISTULA CLOSURE W/ TAH  1979  . YAG LASER APPLICATION Left    Dr. Zadie Rhine    FAMILY HISTORY Family History  Problem Relation Age of Onset  . Heart failure Mother   . Kidney cancer Mother   . Cancer Mother   . Hypertension Mother   . Lung cancer Father        Brain METS  . Cancer Father   . Cirrhosis Brother   . Kidney cancer Sister   . Cancer Sister   . Hypertension Sister   . Diabetes Son   . Heart attack Neg Hx   . Stroke Neg Hx     SOCIAL HISTORY Social History   Tobacco Use  . Smoking status: Former Smoker    Packs/day: 0.50    Years: 40.00    Pack years: 20.00    Types: Cigarettes    Last attempt to quit: 03/30/2013    Years since quitting: 5.6  . Smokeless tobacco: Never Used  . Tobacco comment: Quit since 03/2013  Substance Use Topics  . Alcohol use: No    Alcohol/week: 0.0 standard drinks  . Drug use: No         OPHTHALMIC EXAM:  Base Eye Exam    Visual Acuity (Snellen - Linear)      Right Left   Dist cc 20/60 +1 20/400   Dist ph cc 20/50 20/150   Correction:  Glasses       Tonometry (Tonopen, 2:04 PM)      Right Left   Pressure 16 22       Pupils      Dark Light Shape React APD   Right 4 3 Round Slow None   Left 4 3 Round Sluggish None       Visual Fields (Counting fingers)      Left Right     Full   Restrictions Partial outer inferior temporal, superior nasal deficiencies        Extraocular Movement  Right Left    Full, Ortho Full, Ortho       Neuro/Psych    Oriented x3:  Yes   Mood/Affect:  Normal       Dilation    Both eyes:  1.0% Mydriacyl, 2.5% Phenylephrine @ 1:59 PM        Slit Lamp and Fundus Exam    Slit Lamp Exam       Right Left   Lids/Lashes Dermatochalasis - upper lid Dermatochalasis - upper lid   Conjunctiva/Sclera nasal Pinguecula, Melanosis mild Melanosis   Cornea Arcus, early band K nasal and temporal, tear film debris, irregular epithelium, mild central corneal haze, 2+ Punctate epithelial erosions Arcus, 1+ Punctate epithelial erosions, Debris in tear film, central corneal haze   Anterior Chamber Deep and quiet Deep and quiet   Iris Round and well dilated, No NVI Round and dilated, No NVI   Lens PC IOL in good position, trace Posterior capsular opacification PC IOL in good position with open PC   Vitreous clear, post vitrectomy        Fundus Exam      Right Left   Disc trace, 1-2+ temporal Pallor, sharp margin 3+ temporal pallor, temporal Peripapillary atrophy, Sharp rim   C/D Ratio 0.4 0.3   Macula Flat, Good foveal reflex, mild Epiretinal membrane, Retinal pigment epithelial mottling, No heme or edema Good foveal reflex, No heme or edema   Vessels Tortuous, Vascular attenuation, AV crossing changes Vascular attenuation, Tortuous   Periphery Attached, patch of pigmented lattice at 0600, trace RPE changes    Attached          IMAGING AND PROCEDURES  Imaging and Procedures for _0 @  OCT, Retina - OU - Both Eyes       Right Eye Quality was good. Central Foveal Thickness: 326. Progression has been stable. Findings include abnormal foveal contour, no SRF, inner retinal atrophy, epiretinal membrane (Trace ERM, blunted foveal contour, trace cystic changes centrally, trace vitreous opacities).   Left Eye Quality was good. Central Foveal Thickness: 291. Progression has been stable. Findings include normal foveal contour, no IRF, no SRF, epiretinal membrane (Partial PVD).   Notes *Images captured and stored on drive  Diagnosis / Impression:  OD: Trace ERM, blunted foveal contour, trace cystic changes centrally, trace vitreous opacities OS: NFP; no IRF/SRF; Partial PVD   Clinical  management:  See below  Abbreviations: NFP - Normal foveal profile. CME - cystoid macular edema. PED - pigment epithelial detachment. IRF - intraretinal fluid. SRF - subretinal fluid. EZ - ellipsoid zone. ERM - epiretinal membrane. ORA - outer retinal atrophy. ORT - outer retinal tubulation. SRHM - subretinal hyper-reflective material        Color Fundus Photography Optos - OU - Both Eyes       Right Eye Progression has no prior data. Disc findings include normal observations. Macula : normal observations, drusen. Vessels : attenuated, tortuous vessels. Periphery : normal observations, RPE abnormality.   Left Eye Progression has no prior data. Disc findings include pallor. Macula : normal observations, drusen. Vessels : attenuated, tortuous vessels. Periphery : normal observations, RPE abnormality.   Notes **Images stored on drive**                ASSESSMENT/PLAN:    ICD-10-CM   1. Optic neuropathy, left H46.9 Color Fundus Photography Optos - OU - Both Eyes  2. Diabetes mellitus type 2 without retinopathy ([place]) E11.9   3. Essential hypertension I10   4. Hypertensive retinopathy  of both eyes H35.033 Color Fundus Photography Optos - OU - Both Eyes    CANCELED: Fluorescein Angiography Optos (Transit OS)  5. Vitreomacular adhesion of right eye H43.821   6. Retinal edema H35.81 OCT, Retina - OU - Both Eyes  7. Pseudophakia of both eyes Z96.1     1. Optic Neuropathy OS  - pt reports gradual decline in vision OS over last several months  - initial exam showed relatively normal exam of globe structures, but pt has +rAPD OS and pallor of disc OS ?atrophy -- +optic neuropathy, concern for retrobulbar mass or lesion affecting optic nerve  - MRI orbits w/wo gadolinium done 12.11.19 -- normal study   "No mass or inflammatory process of the orbital compartments or visible optic pathways. No abnormal signal or enhancement of the optic nerves is identified.  - pt and daughter  correlated symptoms with starting new medication for Pulmonary arterial hypertension, Tadalafil  - Cardiologist also suspects medication may be contributing to decreased vision  - pt stopped tadalafil prior to last visit and vision improved subjectively and objectively  - now today, vision is decreased to 20/150 from 20/70 OS  - pt to continue holding tadalafil  - attempted FA today to look at retinal perfusion OS, but unable to obtain venous access for fluorescein infusion  - again, optic disc pallor OS is only real ocular abnormality to correlate decreased vision OS  - given pt's significant co-morbidities, suspect optic neuropathy may be vascular in etiology  - will refer to [facility] Frederico Hamman at [facility] for further Neuro-ophth eval  - F/U here in 4 weeks   2. Diabetes mellitus, type 2 without retinopathy - The incidence, risk factors for progression, natural history and treatment options for diabetic retinopathy  were discussed with patient.   - The need for close monitoring of blood glucose, blood pressure, and serum lipids, avoiding cigarette or any type of tobacco, and the need for long term follow up was also discussed with patient. - f/u in 1 year, sooner prn  3,4. Hypertensive retinopathy OU - discussed importance of tight BP control - monitor  5,6. History of VMT OD - s/p PPV OD with Rankin March 2019 - today, no retinal edema on exam or OCT - right eye doing well  7. Pseudophakia OU  - s/p CE/IOL OU w/ Gershon Crane  - beautiful surgeries, doing well  - s/p YAG capsulotomy OS, Dr. Zadie Rhine, October 2019  - monitor  Ophthalmic Meds Ordered this visit:  No orders of the defined types were placed in this encounter.      Return in about 4 weeks (around 12/30/2018) for f/u optic neuropathy, DFE, OCT.  There are no Patient Instructions on file for this visit.   Explained the diagnoses, plan, and follow up with the patient and they expressed understanding.  Patient expressed  understanding of the importance of proper follow up care.   This document serves as a record of services personally performed by Gardiner Sleeper, MD, PhD. It was created on their behalf by Ernest Mallick, OA, an ophthalmic assistant. The creation of this record is the provider's dictation and/or activities during the visit.    Electronically signed by: Ernest Mallick, OA  01.17.2020 4:21 PM    Gardiner Sleeper, M.D., Ph.D. Diseases & Surgery of the Retina and Vitreous [facility]   I have reviewed the above documentation for accuracy and completeness, and I agree with the above. Gardiner Sleeper, M.D., Ph.D.  12/04/18 4:28 PM    Abbreviations: M myopia (nearsighted); A astigmatism; H hyperopia (farsighted); P presbyopia; Mrx spectacle prescription;  CTL contact lenses; OD right eye; OS left eye; OU both eyes  XT exotropia; ET esotropia; PEK punctate epithelial keratitis; PEE punctate epithelial erosions; DES dry eye syndrome; MGD meibomian gland dysfunction; ATs artificial tears; PFAT's preservative free artificial tears; [place] nuclear sclerotic cataract; PSC posterior subcapsular cataract; ERM epi-retinal membrane; PVD posterior vitreous detachment; RD retinal detachment; DM diabetes mellitus; DR diabetic retinopathy; NPDR non-proliferative diabetic retinopathy; PDR proliferative diabetic retinopathy; CSME clinically significant macular edema; DME diabetic macular edema; dbh dot blot hemorrhages; CWS cotton wool spot; POAG primary open angle glaucoma; C/D cup-to-disc ratio; HVF humphrey visual field; GVF goldmann visual field; OCT optical coherence tomography; IOP intraocular pressure; BRVO Branch retinal vein occlusion; CRVO central retinal vein occlusion; CRAO central retinal artery occlusion; BRAO branch retinal artery occlusion; RT retinal tear; SB scleral buckle; PPV pars plana vitrectomy; VH Vitreous hemorrhage; PRP panretinal laser photocoagulation; IVK intravitreal kenalog;  VMT vitreomacular traction; MH Macular hole;  NVD neovascularization of the disc; NVE neovascularization elsewhere; AREDS age related eye disease study; ARMD age related macular degeneration; POAG primary open angle glaucoma; EBMD epithelial/anterior basement membrane dystrophy; ACIOL anterior chamber intraocular lens; IOL intraocular lens; PCIOL posterior chamber intraocular lens; Phaco/IOL phacoemulsification with intraocular lens placement; [place] photorefractive keratectomy; LASIK laser assisted in situ keratomileusis; HTN hypertension; DM diabetes mellitus; COPD chronic obstructive pulmonary disease

## 2018-12-04 ENCOUNTER — Encounter (INDEPENDENT_AMBULATORY_CARE_PROVIDER_SITE_OTHER): Payer: Self-pay | Admitting: Ophthalmology

## 2018-12-05 DIAGNOSIS — E1149 Type 2 diabetes mellitus with other diabetic neurological complication: Secondary | ICD-10-CM | POA: Diagnosis not present

## 2018-12-05 DIAGNOSIS — M79674 Pain in right toe(s): Secondary | ICD-10-CM | POA: Diagnosis not present

## 2018-12-05 DIAGNOSIS — B351 Tinea unguium: Secondary | ICD-10-CM | POA: Diagnosis not present

## 2018-12-05 DIAGNOSIS — M79675 Pain in left toe(s): Secondary | ICD-10-CM | POA: Diagnosis not present

## 2018-12-06 ENCOUNTER — Other Ambulatory Visit: Payer: Self-pay

## 2018-12-06 DIAGNOSIS — K7460 Unspecified cirrhosis of liver: Secondary | ICD-10-CM

## 2018-12-14 ENCOUNTER — Other Ambulatory Visit: Payer: Self-pay

## 2018-12-14 ENCOUNTER — Telehealth: Payer: Self-pay

## 2018-12-14 DIAGNOSIS — K7460 Unspecified cirrhosis of liver: Secondary | ICD-10-CM

## 2018-12-14 NOTE — Telephone Encounter (Signed)
Pt's daughter [name]) called office yesterday afternoon, pt received recall letter for [place] RUQ to be done in February.  [place] scheduled for 12/22/18 at 10:30am, arrive at 10:15am. NPO after midnight prior to test. Called and informed daughter of appt. Letter mailed.

## 2018-12-20 DIAGNOSIS — H468 Other optic neuritis: Secondary | ICD-10-CM | POA: Diagnosis not present

## 2018-12-20 DIAGNOSIS — H5340 Unspecified visual field defects: Secondary | ICD-10-CM | POA: Diagnosis not present

## 2018-12-20 DIAGNOSIS — H5450 Low vision, one eye, unspecified eye: Secondary | ICD-10-CM | POA: Diagnosis not present

## 2018-12-20 DIAGNOSIS — H538 Other visual disturbances: Secondary | ICD-10-CM | POA: Diagnosis not present

## 2018-12-22 ENCOUNTER — Ambulatory Visit (HOSPITAL_COMMUNITY)
Admission: RE | Admit: 2018-12-22 | Discharge: 2018-12-22 | Disposition: A | Discharge status: Home / Self Care | Payer: Medicare Other | Source: Ambulatory Visit | Attending: Gastroenterology | Admitting: Gastroenterology

## 2018-12-22 DIAGNOSIS — K7460 Unspecified cirrhosis of liver: Secondary | ICD-10-CM | POA: Diagnosis not present

## 2018-12-22 DIAGNOSIS — K915 Postcholecystectomy syndrome: Secondary | ICD-10-CM | POA: Diagnosis not present

## 2018-12-26 DIAGNOSIS — K7460 Unspecified cirrhosis of liver: Secondary | ICD-10-CM | POA: Diagnosis not present

## 2018-12-27 LAB — CBC WITH DIFFERENTIAL/PLATELET
Absolute Monocytes: 299 cells/uL (ref 200–950)
Basophils Absolute: 82 cells/uL (ref 0–200)
Basophils Relative: 2 %
Eosinophils Absolute: 111 cells/uL (ref 15–500)
Eosinophils Relative: 2.7 %
HCT: 35.7 % (ref 35.0–45.0)
Hemoglobin: 12.1 g/dL (ref 11.7–15.5)
Lymphs Abs: 1259 cells/uL (ref 850–3900)
MCH: 33.9 pg — ABNORMAL HIGH (ref 27.0–33.0)
MCHC: 33.9 g/dL (ref 32.0–36.0)
MCV: 100 fL (ref 80.0–100.0)
MPV: 10.7 fL (ref 7.5–12.5)
Monocytes Relative: 7.3 %
Neutro Abs: 2349 cells/uL (ref 1500–7800)
Neutrophils Relative %: 57.3 %
Platelets: 219 10*3/uL (ref 140–400)
RBC: 3.57 10*6/uL — ABNORMAL LOW (ref 3.80–5.10)
RDW: 12.4 % (ref 11.0–15.0)
Total Lymphocyte: 30.7 %
WBC: 4.1 10*3/uL (ref 3.8–10.8)

## 2018-12-27 LAB — COMPREHENSIVE METABOLIC PANEL
AG Ratio: 1.1 (calc) (ref 1.0–2.5)
ALT: 10 U/L (ref 6–29)
AST: 19 U/L (ref 10–35)
Albumin: 3.8 g/dL (ref 3.6–5.1)
Alkaline phosphatase (APISO): 107 U/L (ref 37–153)
BUN: 17 mg/dL (ref 7–25)
CO2: 33 mmol/L — ABNORMAL HIGH (ref 20–32)
Calcium: 9.3 mg/dL (ref 8.6–10.4)
Chloride: 96 mmol/L — ABNORMAL LOW (ref 98–110)
Creat: 0.81 mg/dL (ref 0.60–0.88)
Globulin: 3.6 g/dL (calc) (ref 1.9–3.7)
Glucose, Bld: 167 mg/dL — ABNORMAL HIGH (ref 65–139)
Potassium: 4.2 mmol/L (ref 3.5–5.3)
Sodium: 135 mmol/L (ref 135–146)
Total Bilirubin: 0.5 mg/dL (ref 0.2–1.2)
Total Protein: 7.4 g/dL (ref 6.1–8.1)

## 2018-12-27 LAB — PROTIME-INR
INR: 1
Prothrombin Time: 10.4 s (ref 9.0–11.5)

## 2018-12-29 DIAGNOSIS — H5450 Low vision, one eye, unspecified eye: Secondary | ICD-10-CM | POA: Diagnosis not present

## 2018-12-29 DIAGNOSIS — E1140 Type 2 diabetes mellitus with diabetic neuropathy, unspecified: Secondary | ICD-10-CM | POA: Diagnosis not present

## 2018-12-29 DIAGNOSIS — E7800 Pure hypercholesterolemia, unspecified: Secondary | ICD-10-CM | POA: Diagnosis not present

## 2018-12-29 DIAGNOSIS — H538 Other visual disturbances: Secondary | ICD-10-CM | POA: Diagnosis not present

## 2018-12-29 DIAGNOSIS — I10 Essential (primary) hypertension: Secondary | ICD-10-CM | POA: Diagnosis not present

## 2018-12-29 DIAGNOSIS — E039 Hypothyroidism, unspecified: Secondary | ICD-10-CM | POA: Diagnosis not present

## 2018-12-29 DIAGNOSIS — H468 Other optic neuritis: Secondary | ICD-10-CM | POA: Diagnosis not present

## 2018-12-29 DIAGNOSIS — E538 Deficiency of other specified B group vitamins: Secondary | ICD-10-CM | POA: Diagnosis not present

## 2019-01-02 ENCOUNTER — Encounter (INDEPENDENT_AMBULATORY_CARE_PROVIDER_SITE_OTHER): Payer: Medicare Other | Admitting: Ophthalmology

## 2019-01-03 NOTE — Progress Notes (Signed)
PT's daughter, [name], is aware. Forwarding to [place] to nic the next [place].

## 2019-01-04 ENCOUNTER — Other Ambulatory Visit: Payer: Self-pay

## 2019-01-04 NOTE — Progress Notes (Signed)
PT's daughter, [name] is aware.

## 2019-01-04 NOTE — Progress Notes (Signed)
LMOM for a return call from [name], her daughter.

## 2019-01-05 DIAGNOSIS — I6523 Occlusion and stenosis of bilateral carotid arteries: Secondary | ICD-10-CM | POA: Diagnosis not present

## 2019-01-05 DIAGNOSIS — I6529 Occlusion and stenosis of unspecified carotid artery: Secondary | ICD-10-CM | POA: Diagnosis not present

## 2019-01-10 ENCOUNTER — Other Ambulatory Visit: Payer: Self-pay

## 2019-01-10 MED ORDER — MACITENTAN 10 MG PO TABS: 10.0000 mg | ORAL_TABLET | Freq: Every day | ORAL | 0 refills | Status: DC

## 2019-02-01 ENCOUNTER — Ambulatory Visit: Payer: Self-pay | Admitting: Cardiology

## 2019-02-17 ENCOUNTER — Other Ambulatory Visit: Payer: Self-pay

## 2019-02-17 DIAGNOSIS — I471 Supraventricular tachycardia: Secondary | ICD-10-CM

## 2019-02-17 MED ORDER — MACITENTAN 10 MG PO TABS: 10.0000 mg | ORAL_TABLET | Freq: Every day | ORAL | 0 refills | Status: DC

## 2019-04-20 ENCOUNTER — Ambulatory Visit (INDEPENDENT_AMBULATORY_CARE_PROVIDER_SITE_OTHER): Payer: Medicare Other | Admitting: Gastroenterology

## 2019-04-20 ENCOUNTER — Encounter: Payer: Self-pay | Admitting: Gastroenterology

## 2019-04-20 ENCOUNTER — Other Ambulatory Visit: Payer: Self-pay | Admitting: *Deleted

## 2019-04-20 ENCOUNTER — Other Ambulatory Visit: Payer: Self-pay

## 2019-04-20 DIAGNOSIS — R0989 Other specified symptoms and signs involving the circulatory and respiratory systems: Secondary | ICD-10-CM

## 2019-04-20 DIAGNOSIS — D490 Neoplasm of unspecified behavior of digestive system: Secondary | ICD-10-CM

## 2019-04-20 DIAGNOSIS — R198 Other specified symptoms and signs involving the digestive system and abdomen: Secondary | ICD-10-CM

## 2019-04-20 NOTE — Patient Instructions (Addendum)
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 WE WILL REPEAT THE CT OF THE NECK TO LOOK AT THE PAROTID AND THE THYROID GLAND. I REVIEWED YOUR STUDIES WITH THE RADIOLOGIST TODAY.  CONTINUE PROTONIX. INCREASE TO 30 MINUTES PRIOR TO MEALS TWICE DAILY.  IF YOU ARE HAVING TROUBLE WITH YOUR SWALLOWING, CHANGE TO A SOFT MECHANICAL DIET.  MEATS SHOULD BE GROUND ONLY. DO NOT EAT CHUNKS OF ANYTHING.  FOLLOW UP IN 4 MOS.   SOFT MECHANICAL DIET This SOFT MECHANICAL DIET is restricted to:  Foods that are moist, soft-textured, and easy to chew and swallow.   Meats that are ground or are minced no larger than one-quarter inch pieces. Meats are moist with gravy or sauce added.   Foods that do not include bread or bread-like textures except soft pancakes, well-moistened with syrup or sauce.   Textures with some chewing ability required.   Casseroles without rice.   Cooked vegetables that are less than half an inch in size and easily mashed with a fork. No cooked corn, peas, broccoli, cauliflower, cabbage, Brussels sprouts, asparagus, or other fibrous, non-tender or rubbery cooked vegetables.   Canned fruit except for pineapple. Fruit must be cut into pieces no larger than half an inch in size.   Foods that do not include nuts, seeds, coconut, or sticky textures.   FOOD TEXTURES FOR DYSPHAGIA DIET LEVEL 2 -SOFT MECHANICAL DIET (includes all foods on Dysphagia Diet Level 1 - Pureed, in addition to the foods listed below)  FOOD GROUP: Breads. RECOMMENDED: Soft pancakes, well-moistened with syrup or sauce.  AVOID: All others.  FOOD GROUP: Cereals.  RECOMMENDED: Cooked cereals with little texture, including oatmeal. Unprocessed wheat bran stirred into cereals for bulk. Note: If thin liquids are restricted, it is important that all of the liquid is absorbed into the cereal.  AVOID: All dry cereals and any cooked cereals that may contain flax seeds or other seeds or nuts.  Whole-grain, dry, or coarse cereals. Cereals with nuts, seeds, dried fruit, and/or coconut.  FOOD GROUP: Desserts. RECOMMENDED: Pudding, custard. Soft fruit pies with bottom crust only. Canned fruit (excluding pineapple). Soft, moist cakes with icing.Frozen malts, milk shakes, frozen yogurt, eggnog, nutritional supplements, ice cream, sherbet, regular or sugar-free gelatin, or any foods that become thin liquid at either room (70 F) or body temperature (98 F).  AVOID: Dry, coarse cakes and cookies. Anything with nuts, seeds, coconut, pineapple, or dried fruit. Breakfast yogurt with nuts. Rice or bread pudding.  FOOD GROUP: Fats. RECOMMENDED: Butter, margarine, cream for cereal (depending on liquid consistency recommendations), gravy, cream sauces, sour cream, sour cream dips with soft additives, mayonnaise, salad dressings, cream cheese, cream cheese spreads with soft additives, whipped toppings.  AVOID: All fats with coarse or chunky additives.  FOOD GROUP: Fruits. RECOMMENDED: Soft drained, canned, or cooked fruits without seeds or skin. Fresh soft and ripe banana. Fruit juices with a small amount of pulp. If thin liquids are restricted, fruit juices should be thickened to appropriate consistency.  AVOID: Fresh or frozen fruits. Cooked fruit with skin or seeds. Dried fruits. Fresh, canned, or cooked pineapple.  FOOD GROUP: Meats and Meat Substitutes. (Meat pieces should not exceed 1/4 of an inch cube and should be tender.) RECOMMENDED: Moistened ground or cooked meat, poultry, or fish. Moist ground or tender meat may be served with gravy or sauce. Casseroles without rice. Moist macaroni and cheese, well-cooked pasta with meat sauce, tuna noodle casserole, soft, moist lasagna. Moist meatballs,  meatloaf, or fish loaf. Protein salads, such as tuna or egg without large chunks, celery, or onion. Cottage cheese, smooth quiche without large chunks. Poached, scrambled, or soft-cooked eggs (egg yolks  should not be "runny" but should be moist and able to be mashed with butter, margarine, or other moisture added to them). (Cook eggs to 160 F or use pasteurized eggs for safety.) Souffls may have small, soft chunks. Tofu. Well-cooked, slightly mashed, moist legumes, such as baked beans. All meats or protein substitutes should be served with sauces or moistened to help maintain cohesiveness in the oral cavity.  AVOID: Dry meats, tough meats (such as bacon, sausage, hot dogs, bratwurst). Dry casseroles or casseroles with rice or large chunks. Peanut butter. Cheese slices and cubes. Hard-cooked or crisp fried eggs. Sandwiches.Pizza.  FOOD GROUP: Potatoes and Starches. RECOMMENDED: Well-cooked, moistened, boiled, baked, or mashed potatoes. Well-cooked shredded hash brown potatoes that are not crisp. (All potatoes need to be moist and in sauces.)Well-cooked noodles in sauce. Spaetzel or soft dumplings that have been moistened with butter or gravy.  AVOID: Potato skins and chips. Fried or French-fried potatoes. Rice.  FOOD GROUP: Soups. RECOMMENDED: Soups with easy-to-chew or easy-to-swallow meats or vegetables: Particle sizes in soups should be less than 1/2 inch. Soups will need to be thickened to appropriate consistency if soup is thinner than prescribed liquid consistency.  AVOID: Soups with large chunks of meat and vegetables. Soups with rice, corn, peas.  FOOD GROUP: Vegetables. RECOMMENDED: All soft, well-cooked vegetables. Vegetables should be less than a half inch. Should be easily mashed with a fork.  AVOID: Cooked corn and peas. Broccoli, cabbage, Brussels sprouts, asparagus, or other fibrous, non-tender or rubbery cooked vegetables.  FOOD GROUP: Miscellaneous. RECOMMENDED: Jams and preserves without seeds, jelly. Sauces, salsas, etc., that may have small tender chunks less than 1/2 inch. Soft, smooth chocolate bars that are easily chewed.  AVOID: Seeds, nuts, coconut, or sticky foods.  Chewy candies such as caramels or licorice.

## 2019-04-20 NOTE — Progress Notes (Signed)
Subjective:    Patient ID: [name], female    DOB: 09/09/1931, 85 y.o.   MRN: [id number]  [name], MD  HPI FEELS LIKE A LUMP IS IN HER THROAT. FEELS IT ALL THE TIME. FEELS LIKE IT MAY HAVE BEEN A PILL STUCK THERE.  HAD PAROTID GLAND TROUBLE MAYBE ON THE RIGHT. APPETITE: GOOD. WEIGHT SAME SINCE AUG 2019. METAMUCIL/STOOL SOFTENER DAILY. MAY HAVE REGURGITATION. PROTONIX ONCE A DAY. BUT NOT IDEALLY CONTROLLING HER SYMPTOMS OF REFLUX.  PT DENIES FEVER, CHILLS, HEMATOCHEZIA, HEMATEMESIS, nausea, vomiting, melena, diarrhea, CHEST PAIN, SHORTNESS OF BREATH,  ODYNOPHAGIA, CHANGE IN BOWEL IN HABITS, constipation, abdominal pain, OR heartburn or indigestion.  Past Medical History:  Diagnosis Date  . Acute delirium 06/23/2013  . Acute diastolic congestive heart failure ([place]) 03/21/2014  . Anxiety   . Cancer ([place])   . Carotid artery disease ([facility])    Bilateral ICA occlusion  . Chronic headaches   . Chronic liver disease    ?cirrhosis on 10/2014 CT. H/O elevated alkphos and +AMA on URSO.  Marland Kitchen Coronary atherosclerosis of native coronary artery    Mild nonobstructive disease at cardiac catheterization May 2011  . Depression   . Essential hypertension, benign   . Fracture of femoral neck, right, closed ([place]) 03/28/2014  . History of breast cancer   . History of GI bleed   . Hyperlipidemia   . Orthostatic hypotension    Diabetic polyneuropathy/diabetic dysautonomia   . Osteopenia   . Parotid tumor   . Pneumonia 11/2016  . Pneumonia   . Pressure ulcer, heel, right, unstageable ([place]) 04/17/2014  . PSVT (paroxysmal supraventricular tachycardia) ([place])   . Syncope and collapse 04/15/2010   Qualifier: Diagnosis of  By: Angelena Form, MD, Harrell Gave    . Type 2 diabetes mellitus ([place])   . UPPER GASTROINTESTINAL HEMORRHAGE 03/21/2007   Qualifier: Diagnosis of  By: Jonna Munro MD, Roderic Scarce     Past Surgical History:  Procedure Laterality Date  . ABDOMINAL HYSTERECTOMY    . APPENDECTOMY    . CATARACT  EXTRACTION Bilateral    Dr. Gershon Crane  . CHOLECYSTECTOMY  2008  . COLONOSCOPY  2010   Dr. Oneida Alar: single tubular adenoma, one hyperplastic polyp. Next TCS 2020 health permitting.  . ESOPHAGOGASTRODUODENOSCOPY N/A 08/17/2016   Procedure: ESOPHAGOGASTRODUODENOSCOPY (EGD);  Surgeon: Danie Binder, MD;  [facility];  Service: Endoscopy;  Laterality: N/A;  8:45 am  . ESOPHAGOGASTRODUODENOSCOPY N/A 01/01/2017   Procedure: ESOPHAGOGASTRODUODENOSCOPY (EGD);  Surgeon: Danie Binder, MD;  Location: [facility];  Service: Endoscopy;  Laterality: N/A;  9:15 am  . HEMORRHOID SURGERY  1960s  . HIP ARTHROPLASTY Right 03/28/2014   Procedure: ARTHROPLASTY BIPOLAR HIP;  Surgeon: Carole Civil, MD;  Location: [facility];  Service: Orthopedics;  Laterality: Right;  . LAPAROSCOPIC APPENDECTOMY N/A 04/13/2013   Procedure: APPENDECTOMY LAPAROSCOPIC;  Surgeon: Donato Heinz, MD;  Location: [facility];  Service: General;  Laterality: N/A;  . MASTECTOMY  1994   Left breast -related to breast cancer  . RIGHT HEART CATH N/A 04/29/2017   Procedure: Right Heart Cath;  Surgeon: Adrian Prows, MD;  Location: [facility];  Service: Cardiovascular;  Laterality: N/A;  . RIGHT/LEFT HEART CATH AND CORONARY ANGIOGRAPHY N/A 04/13/2017   Procedure: RIGHT/LEFT HEART CATH AND CORONARY ANGIOGRAPHY;  Surgeon: Adrian Prows, MD;  Location: [facility];  Service: Cardiovascular;  Laterality: N/A;  . SAVORY DILATION N/A 08/17/2016   Procedure: SAVORY DILATION;  Surgeon: Danie Binder, MD;  [facility];  Service: Endoscopy;  Laterality: N/A;  . VESICOVAGINAL FISTULA CLOSURE W/ TAH  1979  . YAG LASER APPLICATION Left    Dr. Zadie Rhine   Allergies  Allergen Reactions  . Aricept [Donepezil Hcl]     Hives, vomiting and headache  . Namenda [Memantine Hcl]     hives  . Adcirca [Tadalafil] Other (See Comments)    Caused blindness in one eye  . Donepezil Nausea Only  . Spironolactone Other (See Comments)     Dizziness, balance problems  . Codeine Rash  . Indocin [Indomethacin] Rash  . Latex Rash  . Penicillins Rash   Current Outpatient Medications  Medication Sig    . acetaminophen (TYLENOL) 500 MG tablet Take 500 mg by mouth every 8 (eight) hours as needed for mild pain or moderate pain.    Marland Kitchen ALPRAZolam (XANAX) 0.25 MG tablet Take 0.25 mg by mouth 2 (two) times daily as needed for anxiety.    Marland Kitchen atorvastatin (LIPITOR) 10 MG tablet Take 1 tablet (10 mg total) by mouth daily.    . cholecalciferol (VITAMIN D) 1000 UNITS tablet Take 1,000 Units by mouth every morning.     . citalopram (CELEXA) 20 MG tablet Take 1 tablet by mouth daily.    . clopidogrel (PLAVIX) 75 MG tablet Take 75 mg by mouth daily.    . clotrimazole-betamethasone (LOTRISONE) cream Apply 1 application topically 2 (two) times daily as needed (Eczema).     . docusate sodium (COLACE) 100 MG capsule Take 100 mg by mouth 2 (two) times daily.    . feeding supplement, GLUCERNA SHAKE, (GLUCERNA SHAKE) LIQD Take 237 mLs by mouth daily as needed.     . furosemide (LASIX) 40 MG tablet Take 1.5 tablets (60 mg total) by mouth daily.    . Glycerin-Polysorbate 80 (REFRESH DRY EYE THERAPY OP) Place 1 drop into both eyes 2 (two) times daily as needed.    Marland Kitchen guaiFENesin (MUCINEX) 600 MG 12 hr tablet Take 1 tablet (600 mg total) by mouth 2 (two) times daily as needed for cough or to loosen phlegm.    . hydrALAZINE (APRESOLINE) 10 MG tablet Take 1 tablet by mouth 2 (two) times a day.    . insulin aspart (NOVOLOG FLEXPEN) 100 UNIT/ML FlexPen Inject 3 Units into the skin 3 (three) times daily with meals. When BG is over 100     . Insulin Glargine (LANTUS SOLOSTAR) 100 UNIT/ML Solostar Pen Inject 8 Units into the skin at bedtime.    Marland Kitchen loratadine (CLARITIN) 10 MG tablet Take 10 mg by mouth daily.    . macitentan (OPSUMIT) 10 MG tablet Take 1 tablet (10 mg total) by mouth daily.    . Methylfol-Algae-B12-Acetylcyst (CEREFOLIN NAC) 6-90.314-2-600 MG TABS Take  1 tablet by mouth daily.     . metoprolol (LOPRESSOR) 50 MG tablet Take 1m in the a.m.and 243min the pm (Patient taking differently: Take 25 mg by mouth 2 (two) times daily. )    . mirtazapine (REMERON) 30 MG tablet Take 30 mg by mouth at bedtime.    . Multiple Vitamin (MULTIVITAMIN) tablet Take 1 tablet by mouth daily. Reported on 02/06/2016    . olmesartan (BENICAR) 20 MG tablet Take 5 mg by mouth daily.    . pantoprazole (PROTONIX) 40 MG tablet : Take 40 mg by mouth daily. )    . potassium chloride (K-DUR,KLOR-CON) 10 MEQ tablet Take 10 mEq by mouth daily.     . psyllium (METAMUCIL SMOOTH  TEXTURE) 28 % packet Take 1 packet by mouth 2 (two) times daily as needed.     . thyroid (ARMOUR) 15 MG tablet Take 15 mg by mouth daily.    Marland Kitchen umeclidinium bromide (INCRUSE ELLIPTA) 62.5 MCG/INH AEPB Inhale 1 puff into the lungs daily.    . ursodiol (ACTIGALL) 500 MG tablet TAKE 1 TABLET TWICE A DAY WITH MEALS    .      Marland Kitchen       Review of Systems PER HPI OTHERWISE ALL SYSTEMS ARE NEGATIVE.    Objective:   Physical Exam Vitals signs reviewed.  Constitutional:      General: She is not in acute distress.    Appearance: She is well-developed.  HENT:     Head: Normocephalic and atraumatic.     Mouth/Throat:     Pharynx: No oropharyngeal exudate.  Eyes:     General: No scleral icterus.    Pupils: Pupils are equal, round, and reactive to light.  Neck:     Musculoskeletal: Normal range of motion and neck supple. No muscular tenderness.     Comments: Possibly enlarged thyroid Cardiovascular:     Rate and Rhythm: Normal rate and regular rhythm.     Heart sounds: Normal heart sounds.  Pulmonary:     Effort: Pulmonary effort is normal. No respiratory distress.     Breath sounds: Normal breath sounds.  Abdominal:     General: Bowel sounds are normal. There is no distension.     Palpations: Abdomen is soft.     Tenderness: There is no abdominal tenderness.  Lymphadenopathy:     Cervical: No  cervical adenopathy.  Neurological:     Mental Status: She is alert and oriented to person, place, and time.     Gait: Gait abnormal.     Comments: NO  NEW FOCAL DEFICITS, WALKS ASSISTED WITH A CANE.  Psychiatric:        Thought Content: Thought content normal.     Comments: FLAT AFFECT, NL MOOD       Assessment & Plan:

## 2019-04-20 NOTE — Assessment & Plan Note (Signed)
IN A PT WITH PRIOR HISTORY OF RIGHT PAROTID TUMOR. SYMPTOMS COULD BE DUE TO UNCONTROLLED REFLUX, DOUBT ESOPHAGEAL STRICTURE OR WEB.  PT NOT INTERESTED IN MRI AFTER EXPERIENCE IN MRI SCANNER DEC 2019. AGREES TO CT OF NECK FOR EVALUATION OF RIGHT NECK. I PERSONALLY REVIEWED THE CT/ MRI 2014-15 WITH DR. Thornton Papas.   CONTINUE PROTONIX. INCREASE TO 30 MINUTES PRIOR TO MEALS TWICE DAILY. CONSIDER BARIUM PILL ESOPHAGRAM FOLLOW UP IN 4 MOS.

## 2019-04-21 ENCOUNTER — Telehealth: Payer: Self-pay | Admitting: *Deleted

## 2019-04-21 NOTE — Progress Notes (Signed)
CC'D TO PCP °

## 2019-04-21 NOTE — Progress Notes (Signed)
ON RECALL

## 2019-04-21 NOTE — Telephone Encounter (Signed)
CT neck scheduled for 6/22 at 4pm, arrival time 3:45pm, liquids only 4 hrs prior.  Called patient and spoke with daughter [name]. She takes patient to her appts. She is aware of appt details. Nothing further needed

## 2019-04-24 ENCOUNTER — Ambulatory Visit: Payer: Self-pay | Admitting: Cardiology

## 2019-04-25 ENCOUNTER — Encounter: Payer: Self-pay | Admitting: Cardiology

## 2019-04-25 ENCOUNTER — Ambulatory Visit (INDEPENDENT_AMBULATORY_CARE_PROVIDER_SITE_OTHER): Payer: Medicare Other | Admitting: Cardiology

## 2019-04-25 ENCOUNTER — Other Ambulatory Visit: Payer: Self-pay

## 2019-04-25 VITALS — BP 160/69 | HR 64 | Ht 64.0 in | Wt 157.8 lb

## 2019-04-25 DIAGNOSIS — I5032 Chronic diastolic (congestive) heart failure: Secondary | ICD-10-CM | POA: Diagnosis not present

## 2019-04-25 DIAGNOSIS — I10 Essential (primary) hypertension: Secondary | ICD-10-CM

## 2019-04-25 DIAGNOSIS — K7469 Other cirrhosis of liver: Secondary | ICD-10-CM

## 2019-04-25 DIAGNOSIS — I2510 Atherosclerotic heart disease of native coronary artery without angina pectoris: Secondary | ICD-10-CM

## 2019-04-25 DIAGNOSIS — I270 Primary pulmonary hypertension: Secondary | ICD-10-CM | POA: Diagnosis not present

## 2019-04-25 NOTE — Progress Notes (Signed)
Primary Physician/Referring:  [name], MD  Patient ID: [name], female    DOB: [birth year], 85 y.o.   MRN: [id number]  Chief Complaint  Patient presents with  . Hypertension  . Congestive Heart Failure  . Tachycardia  . Follow-up    3 MTH    HPI: [name]  is a 85 y.o. female  with primary pulmonary engine, cryptogenic cirrhosis of the liver, presently on Opsumit since July 2018 and now on Adcirca. She was evaluated by Sal[facility] Holmes by Dr. Suzi Roots in November 2018 who recommended continuing present medical therapy and to consider addition of Selexipag if disease progresses.  She has history of breast cancer status post radical mastectomy and chemotherapy, well controlled diabetes mellitus, moderate coronary artery disease and bilateral carotid artery occlusion without stroke.  She called [place] to discuss left eye vision that started 4 weeks ago. SHe has been on Adcirca for the [facility] and previously did not tolerate Sildanafil due to vision change.  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    She was diagnosed with GERD, but otherwise feels well and dyspnea has remained stable and no leg edema, no GI bleed and apatite is good.   Past Medical History:  Diagnosis Date  . Acute delirium 06/23/2013  . Acute diastolic congestive heart failure ([place]) 03/21/2014  . Anxiety   . Cancer ([place])   . Carotid artery disease (HCC)    Bilateral ICA occlusion  . Chronic headaches   . Chronic liver disease    ?cirrhosis on 10/2014 CT. H/O elevated alkphos and +AMA on URSO.  Marland Kitchen Coronary atherosclerosis of native coronary artery    Mild nonobstructive disease at cardiac catheterization May 2011  . Depression   . Essential hypertension, benign   . Fracture of femoral neck, right, closed ([place]) 03/28/2014  . History of breast cancer   . History of GI bleed   . Hyperlipidemia   . Orthostatic hypotension    Diabetic  polyneuropathy/diabetic dysautonomia   . Osteopenia   . Parotid tumor   . Pneumonia 11/2016  . Pneumonia   . Pressure ulcer, heel, right, unstageable ([place]) 04/17/2014  . PSVT (paroxysmal supraventricular tachycardia) ([place])   . Syncope and collapse 04/15/2010   Qualifier: Diagnosis of  By: Angelena Form, MD, Harrell Gave    . Type 2 diabetes mellitus ([place])   . UPPER GASTROINTESTINAL HEMORRHAGE 03/21/2007   Qualifier: Diagnosis of  By: Jonna Munro MD, Roderic Scarce      Past Surgical History:  Procedure Laterality Date  . ABDOMINAL HYSTERECTOMY    . APPENDECTOMY    . CATARACT EXTRACTION Bilateral    Dr. Gershon Crane  . CHOLECYSTECTOMY  2008  . COLONOSCOPY  2010   Dr. Oneida Alar: single tubular adenoma, one hyperplastic polyp. Next TCS 2020 health permitting.  . ESOPHAGOGASTRODUODENOSCOPY N/A 08/17/2016   Procedure: ESOPHAGOGASTRODUODENOSCOPY (EGD);  Surgeon: Danie Binder, MD;  [facility];  Service: Endoscopy;  Laterality: N/A;  8:45 am  . ESOPHAGOGASTRODUODENOSCOPY N/A 01/01/2017   Procedure: ESOPHAGOGASTRODUODENOSCOPY (EGD);  Surgeon: Danie Binder, MD;  [facility]: [facility];  Service: Endoscopy;  Laterality: N/A;  9:15 am  . HEMORRHOID SURGERY  1960s  . HIP ARTHROPLASTY Right 03/28/2014   Procedure: ARTHROPLASTY BIPOLAR HIP;  Surgeon: Carole Civil, MD;  Location: [facility];  Service: Orthopedics;  Laterality: Right;  . LAPAROSCOPIC APPENDECTOMY N/A 04/13/2013   Procedure: APPENDECTOMY LAPAROSCOPIC;  Surgeon: Donato Heinz, MD;  Location:  [facility];  Service: General;  Laterality: N/A;  . MASTECTOMY  1994   Left breast -related to breast cancer  . RIGHT HEART CATH N/A 04/29/2017   Procedure: Right Heart Cath;  Surgeon: Adrian Prows, MD;  Location: [facility];  Service: Cardiovascular;  Laterality: N/A;  . RIGHT/LEFT HEART CATH AND CORONARY ANGIOGRAPHY N/A 04/13/2017   Procedure: RIGHT/LEFT HEART CATH AND CORONARY ANGIOGRAPHY;  Surgeon: Adrian Prows, MD;  Location: [facility];   Service: Cardiovascular;  Laterality: N/A;  . SAVORY DILATION N/A 08/17/2016   Procedure: SAVORY DILATION;  Surgeon: Danie Binder, MD;  [facility];  Service: Endoscopy;  Laterality: N/A;  . VESICOVAGINAL FISTULA CLOSURE W/ TAH  1979  . YAG LASER APPLICATION Left    Dr. Zadie Rhine    Social History   Socioeconomic History  . Marital status: Divorced    Spouse name: Not on file  . Number of children: 4  . Years of education: Not on file  . Highest education level: Not on file  Occupational History  . Occupation: RETIRED FACTORY WORKER    Comment: [employer]  Social Needs  . Financial resource strain: Not on file  . Food insecurity:    Worry: Not on file    Inability: Not on file  . Transportation needs:    Medical: Not on file    Non-medical: Not on file  Tobacco Use  . Smoking status: Former Smoker    Packs/day: 0.50    Years: 40.00    Pack years: 20.00    Types: Cigarettes    Last attempt to quit: 03/30/2013    Years since quitting: 6.0  . Smokeless tobacco: Never Used  . Tobacco comment: Quit since 03/2013  Substance and Sexual Activity  . Alcohol use: No    Alcohol/week: 0.0 standard drinks  . Drug use: No  . Sexual activity: Never  Lifestyle  . Physical activity:    Days per week: Not on file    Minutes per session: Not on file  . Stress: Not on file  Relationships  . Social connections:    Talks on phone: Not on file    Gets together: Not on file    Attends religious service: Not on file    Active member of club or organization: Not on file    Attends meetings of clubs or organizations: Not on file    Relationship status: Not on file  . Intimate partner violence:    Fear of current or ex partner: Not on file    Emotionally abused: Not on file    Physically abused: Not on file    Forced sexual activity: Not on file  Other Topics Concern  . 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 Started age 89.   Drug use -no   Alcohol use - no   Lives with son   Education - 12 th grade                   Review of Systems  Constitution: Negative for chills, decreased appetite, malaise/fatigue and weight gain.  Eyes: Positive for blurred vision.  Cardiovascular: Positive for dyspnea on exertion. Negative for leg swelling and syncope.  Endocrine: Negative for cold intolerance.  Hematologic/Lymphatic: Does not bruise/bleed easily.  Musculoskeletal: Positive for muscle weakness (uses cane for support at home and uses walker when she goes out). Negative for joint swelling.  Gastrointestinal: Negative for abdominal pain, anorexia, change in bowel habit, hematochezia and melena.  Neurological: Negative for headaches and light-headedness.  Psychiatric/Behavioral: Negative for depression and substance abuse.  All other systems reviewed and are negative.     Objective  Blood pressure (!) 160/69, pulse 64, height _0  (1.626 m), weight 157 lb 12.8 oz (71.6 kg), SpO2 95 %. Body mass index is 27.09 kg/m.    Physical Exam  Constitutional: She appears well-developed and well-nourished. No distress.  HENT:  Head: Atraumatic.  Eyes: Conjunctivae are normal.  Neck: Neck supple. No JVD present. No thyromegaly present.  Cardiovascular: Normal rate, regular rhythm and intact distal pulses. Exam reveals no gallop.  Murmur heard. High-pitched blowing holosystolic murmur is present with a grade of 2/6 at the lower left sternal border. Normal S2, loud X2  Pulmonary/Chest: Effort normal and breath sounds normal.  Abdominal: Soft. Bowel sounds are normal.  Musculoskeletal: Normal range of motion.        General: No edema.  Neurological: She is alert.  Skin: Skin is warm and dry.  Psychiatric: She has a normal mood and affect.   Radiology: No results  found.  Laboratory examination:   Labs 06/23/2013: ANA positive. Hepatitis B and C negative. Negative for anti-smooth muscle antibody, mitochondrial antibody.  Labs 07/20/2018: Serum glucose 118 mg, BUN 12, creatinine 0.60, eGFR greater than 60 mL. CMP otherwise normal. HB 11.2/HCT 34.0, platelets 196. Normal indicis.  02/17/2018: WBC 3.0, RBC 3.6, normal hemoglobin, hematocrit 36.6, MCH 33.9, CBC otherwise normal. Creatinine 0.8, EGFR 69/84, potassium 4.7, CMP normal. Hemoglobin A1c 6%. Cholesterol 163, triglycerides 84, HDL 69, LDL 77.   CMP Latest Ref Rng & Units 12/26/2018 10/26/2018 07/20/2018  Glucose 65 - 139 mg/dL 167(H) - 118(H)  BUN 7 - 25 mg/dL 17 - 12  Creatinine 0.60 - 0.88 mg/dL 0.81 0.70 0.63  Sodium 135 - 146 mmol/L 135 - 137  Potassium 3.5 - 5.3 mmol/L 4.2 - 3.6  Chloride 98 - 110 mmol/L 96(L) - 103  CO2 20 - 32 mmol/L 33(H) - 28  Calcium 8.6 - 10.4 mg/dL 9.3 - 8.7(L)  Total Protein 6.1 - 8.1 g/dL 7.4 - -  Total Bilirubin 0.2 - 1.2 mg/dL 0.5 - -  Alkaline Phos 38 - 126 U/L - - -  AST 10 - 35 U/L 19 - -  ALT 6 - 29 U/L 10 - -   CBC Latest Ref Rng & Units 12/26/2018 07/20/2018 07/18/2018  WBC 3.8 - 10.8 Thousand/uL 4.1 4.0 3.9(L)  Hemoglobin 11.7 - 15.5 g/dL 12.1 11.2(L) 11.8(L)  Hematocrit 35.0 - 45.0 % 35.7 34.0(L) 34.8(L)  Platelets 140 - 400 Thousand/uL 219 196 222   Lipid Panel     Component Value Date/Time   CHOL 165 01/02/2016 1100   TRIG 106 01/02/2016 1100   HDL 58 01/02/2016 1100   CHOLHDL 2.8 01/02/2016 1100   VLDL 21 01/02/2016 1100   LDLCALC 86 01/02/2016 1100   HEMOGLOBIN A1C Lab Results  Component Value Date   HGBA1C 7.9 (H) 01/02/2016   MPG 180 01/02/2016   TSH No results for input(s): TSH in the last 8760 hours.  PRN Meds:. There are no discontinued medications. Current Meds  Medication Sig  . acetaminophen (TYLENOL) 500 MG tablet Take 500 mg by mouth every 8 (eight) hours as needed  for mild pain or moderate pain.  Marland Kitchen ALPRAZolam (XANAX) 0.25  MG tablet Take 0.25 mg by mouth 2 (two) times daily as needed for anxiety.  Marland Kitchen atorvastatin (LIPITOR) 10 MG tablet Take 1 tablet (10 mg total) by mouth daily.  . cholecalciferol (VITAMIN D) 1000 UNITS tablet Take 1,000 Units by mouth every morning.   . citalopram (CELEXA) 20 MG tablet Take 1 tablet by mouth daily.  . clopidogrel (PLAVIX) 75 MG tablet Take 75 mg by mouth daily.  . clotrimazole-betamethasone (LOTRISONE) cream Apply 1 application topically 2 (two) times daily as needed (Eczema).   . docusate sodium (COLACE) 100 MG capsule Take 100 mg by mouth 2 (two) times daily.  . feeding supplement, GLUCERNA SHAKE, (GLUCERNA SHAKE) LIQD Take 237 mLs by mouth daily as needed.   . furosemide (LASIX) 40 MG tablet Take 1.5 tablets (60 mg total) by mouth daily.  . Glycerin-Polysorbate 80 (REFRESH DRY EYE THERAPY OP) Place 1 drop into both eyes 2 (two) times daily as needed.  Marland Kitchen guaiFENesin (MUCINEX) 600 MG 12 hr tablet Take 1 tablet (600 mg total) by mouth 2 (two) times daily as needed for cough or to loosen phlegm.  . hydrALAZINE (APRESOLINE) 10 MG tablet Take 1 tablet by mouth 2 (two) times a day.  . insulin aspart (NOVOLOG FLEXPEN) 100 UNIT/ML FlexPen Inject 3 Units into the skin 3 (three) times daily with meals. When BG is over 100   . Insulin Glargine (LANTUS SOLOSTAR) 100 UNIT/ML Solostar Pen Inject 8 Units into the skin at bedtime.  Marland Kitchen loratadine (CLARITIN) 10 MG tablet Take 10 mg by mouth daily.  . macitentan (OPSUMIT) 10 MG tablet Take 1 tablet (10 mg total) by mouth daily.  . Methylfol-Algae-B12-Acetylcyst (CEREFOLIN NAC) 6-90.314-2-600 MG TABS Take 1 tablet by mouth daily.   . metoprolol (LOPRESSOR) 50 MG tablet Take 11m in the a.m.and 234min the pm (Patient taking differently: Take 12.5 mg by mouth 2 (two) times daily. )  . mirtazapine (REMERON) 30 MG tablet Take 30 mg by mouth at bedtime.  . Multiple Vitamin (MULTIVITAMIN) tablet Take 1 tablet by mouth daily. Reported on 02/06/2016  .  olmesartan (BENICAR) 20 MG tablet Take 5 mg by mouth daily.  . pantoprazole (PROTONIX) 40 MG tablet TAKE 1 TABLET TWICE A DAY BEFORE MEALS (Patient taking differently: Take 40 mg by mouth daily. )  . potassium chloride (K-DUR,KLOR-CON) 10 MEQ tablet Take 10 mEq by mouth daily.   . psyllium (METAMUCIL SMOOTH TEXTURE) 28 % packet Take 1 packet by mouth 2 (two) times daily as needed.   . thyroid (ARMOUR) 15 MG tablet Take 15 mg by mouth daily.  . Marland Kitchenmeclidinium bromide (INCRUSE ELLIPTA) 62.5 MCG/INH AEPB Inhale 1 puff into the lungs daily.  . ursodiol (ACTIGALL) 500 MG tablet TAKE 1 TABLET TWICE A DAY WITH MEALS    Cardiac Studies:   Carotid artery duplex 0405/15/2018Bilateral internal carotid artery flow appears to be sluggish and may suggest complete occlusion. Left vertebral artery flow is undetectable (no flow). Antegrade right vertebral artery flow. Consider different modality of imaging to better define anatomy.  Echocardiogram 10/21/2018: 1. Left ventricle cavity is normal in size. Mild concentric hypertrophy of the left ventricle. Mild flattening of septum due to elevated RV pressure Normal global wall motion. Doppler evidence of grade III (restrictive) diastolic dysfunction, elevated LAP. Calculated EF 64%. 2. Left atrial cavity is mild to moderately dilated. 3. Right atrial cavity is mild to moderately dilated. 4. Trileaflet aortic valve. Mild  aortic valve leaflet calcification. 5. Mild (Grade I) mitral regurgitation. Mild calcification of the mitral valve annulus. 6. Moderate tricuspid regurgitation. Moderate pulmonary hypertension with approx. PA syst. pressure of 50 mm of Hg. 7. Moderate pulmonic regurgitation. 8. Trace pericardial effusion. 9. c.f. echo. of 05/12/2018, there is improvement in PA pressure. Pul. HTN is moderate now, it was severe before. No regional wall motion abnormalities of LV. The RV size is normal.  Right Heart Cath 04/29/2017, RA pressure 8/2, mean 2 mmHg. RV  78/1, EDP 3 mmHg. PA 80/18, mean 42 mmHg. PW 13/14, mean 11 mmHg. PA saturation was 73%, aortic saturation 98%. Cardiac output 4.88, cardiac index 2.85 by Fick.  SIX MIN WALK 04/25/2019 04/25/2019  Supplimental Oxygen during Test? (L/min) Yes -  Type Continuous -  Laps 6 6  Baseline Heartrate 69 -  Baseline SPO2 94 -  Heartrate 102 -  SPO2 91 -  Heartrate 83 -  SPO2 92 -  Stopped or Paused before Six Minutes No -  Tech Comments: pt walked 6 laps in 6 minutes -  Total distance 120 meters.    Assessment   Diastolic CHF, chronic (HCC) - Plan: 6 minute walk  Primary pulmonary hypertension ([place]) - Plan: EKG 12-Lead, 6 minute walk, PCV ECHOCARDIOGRAM COMPLETE  Coronary artery disease involving native coronary artery of native heart without angina pectoris  Essential hypertension  Cryptogenic cirrhosis of liver ([place])  EKG 04/25/2019: Sinus rhythm with first-degree AV block at the rate of 65 bpm, left atrial abnormality, rightward axis, incomplete right bundle branch block.  Consider RVH.  Normal QT interval.  Recommendations:  Patient was also evaluated by Dr Daphane Shepherd 10/01/2017 at [facility] and felt that she  Was on appropriate management with Macitnetan and addition of tadalafil, consider addition of Selexipag if disease progresses. There is an improvement in 6 minute walk test from baseline 56 m prior to therapy, to 104 m in our office and close to 200 m with Dr. Harrietta Guardian.  Today she is looking well, no significant change in a 6 minute walk test.  She does need repeat echocardiogram to follow-up on pulmonary hypertension.  She has no leg edema, no ascites.  Continue oxygen supplementation and present medical therapy. No change in walking distance after 2 years of therapy. I am pleased with the status quo.  With regard to CAD, fortunately she has not had any recurrence of angina pectoris.  EKG is also nonischemic.  I will see her back in 3 months.  Adrian Prows, MD, [facility]  04/25/2019, 12:25 PM [facility] [place] Pager: [phone] Office: [phone] If no answer Cell [phone]

## 2019-04-26 DIAGNOSIS — I10 Essential (primary) hypertension: Secondary | ICD-10-CM | POA: Diagnosis not present

## 2019-04-26 DIAGNOSIS — E1140 Type 2 diabetes mellitus with diabetic neuropathy, unspecified: Secondary | ICD-10-CM | POA: Diagnosis not present

## 2019-05-01 DIAGNOSIS — E1149 Type 2 diabetes mellitus with other diabetic neurological complication: Secondary | ICD-10-CM | POA: Diagnosis not present

## 2019-05-01 DIAGNOSIS — B351 Tinea unguium: Secondary | ICD-10-CM | POA: Diagnosis not present

## 2019-05-01 DIAGNOSIS — M79675 Pain in left toe(s): Secondary | ICD-10-CM | POA: Diagnosis not present

## 2019-05-01 DIAGNOSIS — M79674 Pain in right toe(s): Secondary | ICD-10-CM | POA: Diagnosis not present

## 2019-05-02 ENCOUNTER — Other Ambulatory Visit (HOSPITAL_COMMUNITY): Payer: Self-pay | Admitting: Internal Medicine

## 2019-05-02 DIAGNOSIS — Z1231 Encounter for screening mammogram for malignant neoplasm of breast: Secondary | ICD-10-CM

## 2019-05-03 DIAGNOSIS — R05 Cough: Secondary | ICD-10-CM | POA: Diagnosis not present

## 2019-05-03 DIAGNOSIS — E039 Hypothyroidism, unspecified: Secondary | ICD-10-CM | POA: Diagnosis not present

## 2019-05-03 DIAGNOSIS — I10 Essential (primary) hypertension: Secondary | ICD-10-CM | POA: Diagnosis not present

## 2019-05-03 DIAGNOSIS — E119 Type 2 diabetes mellitus without complications: Secondary | ICD-10-CM | POA: Diagnosis not present

## 2019-05-08 ENCOUNTER — Ambulatory Visit (HOSPITAL_COMMUNITY)
Admission: RE | Admit: 2019-05-08 | Discharge: 2019-05-08 | Disposition: A | Discharge status: Home / Self Care | Payer: Medicare Other | Source: Ambulatory Visit | Attending: Gastroenterology | Admitting: Gastroenterology

## 2019-05-08 ENCOUNTER — Other Ambulatory Visit: Payer: Self-pay

## 2019-05-08 DIAGNOSIS — D490 Neoplasm of unspecified behavior of digestive system: Secondary | ICD-10-CM | POA: Insufficient documentation

## 2019-05-08 DIAGNOSIS — I6523 Occlusion and stenosis of bilateral carotid arteries: Secondary | ICD-10-CM | POA: Diagnosis not present

## 2019-05-08 LAB — POCT I-STAT CREATININE: Creatinine, Ser: 0.8 mg/dL (ref 0.44–1.00)

## 2019-05-08 MED ORDER — IOHEXOL 300 MG/ML  SOLN
75.0000 mL | Freq: Once | INTRAMUSCULAR | Status: AC | PRN
  Administered 2019-05-08: 16:00:00 75 mL via INTRAVENOUS

## 2019-05-09 NOTE — Progress Notes (Signed)
PT and daughter [name] was informed. They do not need a referral since pt has been to a couple of ENT doctors.

## 2019-05-16 ENCOUNTER — Other Ambulatory Visit: Payer: Self-pay

## 2019-05-16 DIAGNOSIS — I471 Supraventricular tachycardia: Secondary | ICD-10-CM

## 2019-05-16 MED ORDER — MACITENTAN 10 MG PO TABS: 10.0000 mg | ORAL_TABLET | Freq: Every day | ORAL | 0 refills | Status: DC

## 2019-05-24 ENCOUNTER — Other Ambulatory Visit: Payer: Self-pay | Admitting: Nurse Practitioner

## 2019-05-24 DIAGNOSIS — K279 Peptic ulcer, site unspecified, unspecified as acute or chronic, without hemorrhage or perforation: Secondary | ICD-10-CM

## 2019-06-06 ENCOUNTER — Telehealth: Payer: Self-pay | Admitting: Gastroenterology

## 2019-06-06 DIAGNOSIS — K7460 Unspecified cirrhosis of liver: Secondary | ICD-10-CM

## 2019-06-06 NOTE — Telephone Encounter (Signed)
RECALL FOR ULTRASOUND 

## 2019-06-06 NOTE — Telephone Encounter (Signed)
Letter mailed.

## 2019-06-07 ENCOUNTER — Ambulatory Visit (INDEPENDENT_AMBULATORY_CARE_PROVIDER_SITE_OTHER): Payer: Medicare Other

## 2019-06-07 ENCOUNTER — Other Ambulatory Visit: Payer: Self-pay

## 2019-06-07 DIAGNOSIS — I270 Primary pulmonary hypertension: Secondary | ICD-10-CM

## 2019-06-13 ENCOUNTER — Other Ambulatory Visit: Payer: Self-pay

## 2019-06-13 ENCOUNTER — Other Ambulatory Visit: Payer: Medicare Other

## 2019-06-13 DIAGNOSIS — Z20822 Contact with and (suspected) exposure to covid-19: Secondary | ICD-10-CM

## 2019-06-13 DIAGNOSIS — R6889 Other general symptoms and signs: Secondary | ICD-10-CM | POA: Diagnosis not present

## 2019-06-15 LAB — NOVEL CORONAVIRUS, NAA: SARS-CoV-2, NAA: NOT DETECTED

## 2019-06-19 NOTE — Addendum Note (Signed)
Addended by: Inge Rise on: 06/19/2019 01:59 PM   Modules accepted: Orders

## 2019-06-19 NOTE — Telephone Encounter (Signed)
Daughter called to schedule ultrasound from letter they received in the mail.  U/s scheduled for 8/11 at 10:30am, arrival time 10:15am, npo after midnight.   Called daughter and is aware of appt details.

## 2019-06-27 ENCOUNTER — Ambulatory Visit (HOSPITAL_COMMUNITY)
Admission: RE | Admit: 2019-06-27 | Discharge: 2019-06-27 | Disposition: A | Discharge status: Home / Self Care | Payer: Medicare Other | Source: Ambulatory Visit | Attending: Gastroenterology | Admitting: Gastroenterology

## 2019-06-27 ENCOUNTER — Other Ambulatory Visit: Payer: Self-pay

## 2019-06-27 DIAGNOSIS — K7460 Unspecified cirrhosis of liver: Secondary | ICD-10-CM | POA: Diagnosis not present

## 2019-06-29 NOTE — Progress Notes (Signed)
LMOM to call.

## 2019-07-01 ENCOUNTER — Emergency Department (HOSPITAL_COMMUNITY)
Admission: EM | Admit: 2019-07-01 | Discharge: 2019-07-02 | Disposition: A | Discharge status: Home / Self Care | Payer: Medicare Other | Attending: Emergency Medicine | Admitting: Emergency Medicine

## 2019-07-01 ENCOUNTER — Other Ambulatory Visit: Payer: Self-pay

## 2019-07-01 ENCOUNTER — Encounter (HOSPITAL_COMMUNITY): Payer: Self-pay | Admitting: Emergency Medicine

## 2019-07-01 DIAGNOSIS — E039 Hypothyroidism, unspecified: Secondary | ICD-10-CM | POA: Diagnosis not present

## 2019-07-01 DIAGNOSIS — I2510 Atherosclerotic heart disease of native coronary artery without angina pectoris: Secondary | ICD-10-CM | POA: Insufficient documentation

## 2019-07-01 DIAGNOSIS — I110 Hypertensive heart disease with heart failure: Secondary | ICD-10-CM | POA: Insufficient documentation

## 2019-07-01 DIAGNOSIS — Z794 Long term (current) use of insulin: Secondary | ICD-10-CM | POA: Diagnosis not present

## 2019-07-01 DIAGNOSIS — H15101 Unspecified episcleritis, right eye: Secondary | ICD-10-CM | POA: Diagnosis not present

## 2019-07-01 DIAGNOSIS — E119 Type 2 diabetes mellitus without complications: Secondary | ICD-10-CM | POA: Diagnosis not present

## 2019-07-01 DIAGNOSIS — Z87891 Personal history of nicotine dependence: Secondary | ICD-10-CM | POA: Insufficient documentation

## 2019-07-01 DIAGNOSIS — H5789 Other specified disorders of eye and adnexa: Secondary | ICD-10-CM | POA: Diagnosis present

## 2019-07-01 DIAGNOSIS — Z79899 Other long term (current) drug therapy: Secondary | ICD-10-CM | POA: Diagnosis not present

## 2019-07-01 DIAGNOSIS — I5032 Chronic diastolic (congestive) heart failure: Secondary | ICD-10-CM | POA: Diagnosis not present

## 2019-07-01 MED ORDER — FLUORESCEIN SODIUM 1 MG OP STRP
1.0000 | ORAL_STRIP | Freq: Once | OPHTHALMIC | Status: DC
  Filled 2019-07-01: qty 1

## 2019-07-01 MED ORDER — TETRACAINE HCL 0.5 % OP SOLN
2.0000 [drp] | Freq: Once | OPHTHALMIC | Status: DC
  Filled 2019-07-01: qty 4

## 2019-07-01 NOTE — ED Notes (Signed)
Fluorescein strip and tetracaine placed at bedside for Dr Wyvonnia Dusky to administer.

## 2019-07-01 NOTE — ED Triage Notes (Signed)
Pt C/O blood in the right eye that began tonight. Pt denies any pain or change in vision.

## 2019-07-01 NOTE — ED Provider Notes (Signed)
[facility] EMERGENCY DEPARTMENT Provider Note   CSN: [id number] Arrival date & time: 07/01/19  2231     History   Chief Complaint Chief Complaint  Patient presents with   Eye Problem    HPI [name] is a 85 y.o. female.     Patient presents with redness to her right eye that was noticed in the mirror about 3 PM.  She denies any falls or trauma.  She has a history of bilateral lens replacements as well as surgery on her right eye in March 2020 "vitreomacular traction syndrome."  She denies any change in her vision.  There is no blurred vision or double vision.  No floaters.  No pain with eye movement.  No fever, chills, shortness of breath or chest pain.  No headache.  No difficulty speaking or swallowing. He takes Plavix but no other blood thinners.  Denies any trauma to her eye.  She does wear glasses without contacts.  Her eye doctors are Dr. Gershon Crane and Dr. Zadie Rhine.  The history is provided by the patient.    Past Medical History:  Diagnosis Date   Acute delirium 04/23/6719   Acute diastolic congestive heart failure ([place]) 03/21/2014   Anxiety    Cancer ([place])    Carotid artery disease (HCC)    Bilateral ICA occlusion   Chronic headaches    Chronic liver disease    ?cirrhosis on 10/2014 CT. H/O elevated alkphos and +AMA on URSO.   Coronary atherosclerosis of native coronary artery    Mild nonobstructive disease at cardiac catheterization May 2011   Depression    Essential hypertension, benign    Fracture of femoral neck, right, closed ([place]) 03/28/2014   History of breast cancer    History of GI bleed    Hyperlipidemia    Orthostatic hypotension    Diabetic polyneuropathy/diabetic dysautonomia    Osteopenia    Parotid tumor    Pneumonia 11/2016   Pneumonia    Pressure ulcer, heel, right, unstageable ([place]) 04/17/2014   PSVT (paroxysmal supraventricular tachycardia) ([place])    Syncope and collapse 04/15/2010   Qualifier: Diagnosis of  By:  Angelena Form, MD, Christopher     Type 2 diabetes mellitus ([place])    UPPER GASTROINTESTINAL HEMORRHAGE 03/21/2007   Qualifier: Diagnosis of  By: Jonna Munro MD, Cornelius      Patient Active Problem List   Diagnosis Date Noted   Globus sensation 04/20/2019   Acute on chronic respiratory failure with hypoxia ([place])    CAP (community acquired pneumonia) 07/19/2018   Flatulence/wind 07/06/2018   Pulmonary hypertension ([place]) 04/25/2017   Dysphagia 07/30/2016   Hypothyroid 12/16/2015   Cirrhosis of liver ([place]) 07/17/2015   Congestive heart disease ([place])    Type 2 diabetes mellitus without complication (HCC)    Diastolic CHF, chronic ([place]) 10/25/2014   Bloating 06/27/2014   SVT (supraventricular tachycardia) ([place]) 04/17/2014   Pressure ulcer, heel, right, unstageable ([place]) 04/17/2014   Anemia 04/17/2014   Fracture of femoral neck, right, closed ([place]) 03/28/2014   Hip fracture ([place]) 94/70/9628   Acute diastolic congestive heart failure ([place]) 03/21/2014   Abnormal carotid duplex scan 02/20/2014   Orthostatic hypotension 02/20/2014   Right sided weakness 02/16/2014   Loss of weight 06/23/2013   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   Leg weakness, bilateral 06/12/2013   PSVT (paroxysmal supraventricular tachycardia) ([place]) 06/12/2013   Chronic cough 11/28/2011   Dyspnea 10/21/2011   H/O tobacco use, presenting hazards to health 10/21/2011   CORONARY ATHEROSCLEROSIS  NATIVE CORONARY ARTERY 04/15/2010   Type 2 diabetes mellitus ([place]) 07/30/2008   TREMOR, LEFT HAND 05/07/2008   SCAR CONDITION AND FIBROSIS OF SKIN 03/05/2008   Hyperlipidemia 10/11/2006   Anxiety state 10/11/2006   Depression 10/11/2006   Essential hypertension 10/11/2006   OSTEOPENIA 10/11/2006   NEOPLASM, MALIGNANT, BREAST, HX OF 10/11/2006    Past Surgical History:  Procedure Laterality Date   ABDOMINAL HYSTERECTOMY     APPENDECTOMY     CATARACT EXTRACTION Bilateral    Dr.  Gershon Crane   CHOLECYSTECTOMY  2008   COLONOSCOPY  2010   Dr. Oneida Alar: single tubular adenoma, one hyperplastic polyp. Next TCS 2020 health permitting.   ESOPHAGOGASTRODUODENOSCOPY N/A 08/17/2016   Procedure: ESOPHAGOGASTRODUODENOSCOPY (EGD);  Surgeon: Danie Binder, MD;  Location: [facility] END[facility] SU[facility];  Service: Endoscopy;  Laterality: N/A;  8:45 am   ESOPHAGOGASTRODUODENOSCOPY N/A 01/01/2017   Procedure: ESOPHAGOGASTRODUODENOSCOPY (EGD);  Surgeon: Danie Binder, MD;  Location: [facility] ENDO SUITE;  Service: Endoscopy;  Laterality: N/A;  9:15 am   HEMORRHOID SURGERY  1960s   HIP ARTHROPLASTY Right 03/28/2014   Procedure: ARTHROPLASTY BIPOLAR HIP;  Surgeon: Carole Civil, MD;  Location: [facility] ORS;  Service: Orthopedics;  Laterality: Right;   LAPAROSCOPIC APPENDECTOMY N/A 04/13/2013   Procedure: APPENDECTOMY LAPAROSCOPIC;  Surgeon: Donato Heinz, MD;  Location: [facility] ORS;  Service: General;  Laterality: N/A;   MASTECTOMY  1994   Left breast -related to breast cancer   RIGHT HEART CATH N/A 04/29/2017   Procedure: Right Heart Cath;  Surgeon: Adrian Prows, MD;  Location: [facility];  Service: Cardiovascular;  Laterality: N/A;   RIGHT/LEFT HEART CATH AND CORONARY ANGIOGRAPHY N/A 04/13/2017   Procedure: RIGHT/LEFT HEART CATH AND CORONARY ANGIOGRAPHY;  Surgeon: Adrian Prows, MD;  Location: [facility];  Service: Cardiovascular;  Laterality: N/A;   SAVORY DILATION N/A 08/17/2016   Procedure: SAVORY DILATION;  Surgeon: Danie Binder, MD;  Location: [facility];  Service: Endoscopy;  Laterality: N/A;   VESICOVAGINAL FISTULA CLOSURE W/ TAH  6629   YAG LASER APPLICATION Left    Dr. Zadie Rhine     OB History   No obstetric history on file.      Home Medications    Prior to Admission medications   Medication Sig Start Date End Date Taking? Authorizing Provider  acetaminophen (TYLENOL) 500 MG tablet Take 500 mg by mouth every 8 (eight) hours as needed for mild pain or moderate pain.     [provider]  ALPRAZolam Duanne Moron) 0.25 MG tablet Take 0.25 mg by mouth 2 (two) times daily as needed for anxiety.    [provider]  atorvastatin (LIPITOR) 10 MG tablet Take 1 tablet (10 mg total) by mouth daily. 02/20/14   [name], MD  cholecalciferol (VITAMIN D) 1000 UNITS tablet Take 1,000 Units by mouth every morning.     [provider]  citalopram (CELEXA) 20 MG tablet Take 1 tablet by mouth daily. 06/05/18   [provider]  clopidogrel (PLAVIX) 75 MG tablet Take 75 mg by mouth daily.    [provider]  clotrimazole-betamethasone (LOTRISONE) cream Apply 1 application topically 2 (two) times daily as needed (Eczema).  10/26/16   [provider]  docusate sodium (COLACE) 100 MG capsule Take 100 mg by mouth 2 (two) times daily.    [provider]  feeding supplement, GLUCERNA SHAKE, (GLUCERNA SHAKE) LIQD Take 237 mLs by mouth daily as needed.     [provider]  furosemide (LASIX) 40 MG tablet Take 1.5 tablets (60 mg total) by mouth daily. 02/18/16   Minus Breeding, MD  Glycerin-Polysorbate 80 (REFRESH DRY EYE THERAPY OP) Place 1 drop into both eyes 2 (two) times daily as needed.    [provider]  guaiFENesin (MUCINEX) 600 MG 12 hr tablet Take 1 tablet (600 mg total) by mouth 2 (two) times daily as needed for cough or to loosen phlegm. 07/20/18 07/20/19  Barton Dubois, MD  hydrALAZINE (APRESOLINE) 10 MG tablet Take 1 tablet by mouth 2 (two) times a day. 03/19/19   [provider]  hydrALAZINE (APRESOLINE) 25 MG tablet Take 1 tablet (25 mg total) by mouth 2 (two) times daily. Patient not taking: Reported on 04/20/2019 12/16/15   Imogene Burn, PA-C  insulin aspart (NOVOLOG FLEXPEN) 100 UNIT/ML FlexPen Inject 3 Units into the skin 3 (three) times daily with meals. When BG is over 100     [provider]  Insulin Glargine (LANTUS SOLOSTAR) 100 UNIT/ML Solostar Pen Inject 8 Units into the skin at  bedtime.    [provider]  loratadine (CLARITIN) 10 MG tablet Take 10 mg by mouth daily.    [provider]  macitentan (OPSUMIT) 10 MG tablet Take 1 tablet (10 mg total) by mouth daily. 05/16/19   Adrian Prows, MD  Methylfol-Algae-B12-Acetylcyst (CEREFOLIN NAC) 6-90.314-2-600 MG TABS Take 1 tablet by mouth daily.     [provider]  metoprolol (LOPRESSOR) 50 MG tablet Take 55m in the a.m.and 294min the pm Patient taking differently: Take 12.5 mg by mouth 2 (two) times daily.  12/16/15   LeImogene BurnPA-C  mirtazapine (REMERON) 30 MG tablet Take 30 mg by mouth at bedtime.    [provider]  Multiple Vitamin (MULTIVITAMIN) tablet Take 1 tablet by mouth daily. Reported on 02/06/2016    [provider]  olmesartan (BENICAR) 20 MG tablet Take 5 mg by mouth daily.    [provider]  pantoprazole (PROTONIX) 40 MG tablet TAKE 1 TABLET TWICE A DAY BEFORE MEALS 05/25/19   LeMahala MenghiniPA-C  potassium chloride (K-DUR,KLOR-CON) 10 MEQ tablet Take 10 mEq by mouth daily.     [provider]  psyllium (METAMUCIL SMOOTH TEXTURE) 28 % packet Take 1 packet by mouth 2 (two) times daily as needed.     [provider]  saccharomyces boulardii (FLORASTOR) 250 MG capsule Take 1 capsule (250 mg total) by mouth 2 (two) times daily. Patient not taking: Reported on 04/25/2019 07/20/18   MaBarton DuboisMD  thyroid (ARMOUR) 15 MG tablet Take 15 mg by mouth daily.    [provider]  umeclidinium bromide (INCRUSE ELLIPTA) 62.5 MCG/INH AEPB Inhale 1 puff into the lungs daily.    [provider]  ursodiol (ACTIGALL) 500 MG tablet TAKE 1 TABLET TWICE A DAY WITH MEALS 10/17/18   BoAnnitta NeedsNP    Family History Family History  Problem Relation Age of Onset   Heart failure Mother    Kidney cancer Mother    Cancer Mother    Hypertension Mother    Lung cancer Father        Brain METS   Cancer Father    Cirrhosis Brother     Kidney cancer Sister    Cancer Sister    Hypertension Sister    Diabetes Son    Heart attack Neg Hx    Stroke Neg Hx     Social History Social History  Tobacco Use   Smoking status: Former Smoker    Packs/day: 0.50    Years: 40.00    Pack years: 20.00    Types: Cigarettes    Quit date: 03/30/2013    Years since quitting: 6.2   Smokeless tobacco: Never Used   Tobacco comment: Quit since 03/2013  Substance Use Topics   Alcohol use: No    Alcohol/week: 0.0 standard drinks   Drug use: No     Allergies   Aricept [donepezil hcl], Namenda [memantine hcl], Adcirca [tadalafil], Donepezil, Spironolactone, Codeine, Indocin [indomethacin], Latex, and Penicillins   Review of Systems Review of Systems  Constitutional: Negative for activity change, appetite change and fever.  HENT: Negative for congestion.   Eyes: Positive for pain and redness. Negative for photophobia and visual disturbance.  Respiratory: Negative for cough, chest tightness and shortness of breath.   Cardiovascular: Negative for chest pain.  Gastrointestinal: Negative for abdominal pain, nausea and vomiting.  Genitourinary: Negative for dysuria and hematuria.  Musculoskeletal: Negative for arthralgias, back pain and myalgias.  Neurological: Negative for dizziness, weakness and headaches.   all other systems are negative except as noted in the HPI and PMH.     Physical Exam Updated Vital Signs BP (!) 162/62 (BP Location: Left Arm)    Pulse 68    Temp 98.7 F (37.1 C) (Oral)    Resp 17    Ht _0  (1.626 m)    Wt 67.6 kg    SpO2 95%    BMI 25.58 kg/m   Physical Exam Vitals signs and nursing note reviewed.  Constitutional:      General: She is not in acute distress.    Appearance: She is well-developed.  HENT:     Head: Normocephalic and atraumatic.     Mouth/Throat:     Pharynx: No oropharyngeal exudate.  Eyes:     General: Lids are normal. Lids are everted, no foreign bodies appreciated.  Vision grossly intact.        Right eye: No foreign body.     Intraocular pressure: Right eye pressure is 20 mmHg. Measurements were taken using a handheld tonometer.    Extraocular Movements: Extraocular movements intact.     Conjunctiva/sclera:     Right eye: Right conjunctiva is injected. Hemorrhage present.     Pupils: Pupils are equal, round, and reactive to light.      Comments: There is redness and inflammation of the nasal sclera of the right eye.  No appreciable hyphema or hypopyon.  Neck:     Musculoskeletal: Normal range of motion and neck supple.     Comments: No meningismus. Cardiovascular:     Rate and Rhythm: Normal rate and regular rhythm.     Heart sounds: Normal heart sounds. No murmur.  Pulmonary:     Effort: Pulmonary effort is normal. No respiratory distress.     Breath sounds: Normal breath sounds.  Abdominal:     Palpations: Abdomen is soft.     Tenderness: There is no abdominal tenderness. There is no guarding or rebound.  Musculoskeletal: Normal range of motion.        General: No tenderness.  Skin:    General: Skin is warm.  Neurological:     Mental Status: She is alert and oriented to person, place, and time.     Cranial Nerves: No cranial nerve deficit.     Motor: No abnormal muscle tone.     Coordination: Coordination normal.     Comments: No  ataxia on finger to nose bilaterally. No pronator drift. 5/5 strength throughout. CN 2-12 intact.Equal grip strength. Sensation intact.   Psychiatric:        Behavior: Behavior normal.          ED Treatments / Results  Labs (all labs ordered are listed, but only abnormal results are displayed) Labs Reviewed - No data to display  EKG None  Radiology No results found.  Procedures Ultrasound ED Ocular  Date/Time: 07/01/2019 11:39 PM Performed by: Ezequiel Essex, MD Authorized by: Ezequiel Essex, MD   PROCEDURE DETAILS:    Indications comment:  Eye redness   Assessed:  Right eye   Right  eye axial view: obtained     Right eye sagittal view: obtained     Images: archived     Limitations:  None RIGHT EYE FINDINGS:     right eye lens not dislodged    no evidence of retinal detachment of the right eye    no ruptured globe in right eye   (including critical care time)  Medications Ordered in ED Medications  fluorescein ophthalmic strip 1 strip (has no administration in time range)  tetracaine (PONTOCAINE) 0.5 % ophthalmic solution 2 drop (has no administration in time range)     Initial Impression / Assessment and Plan / ED Course  I have reviewed the triage vital signs and the nursing notes.  Pertinent labs & imaging results that were available during my care of the patient were reviewed by me and considered in my medical decision making (see chart for details).       Redness to right eye noticed today.  No pain.  No change in vision.  No blurry vision or double vision.  Intraocular pressure is normal.  Ultrasound shows no obvious retinal detachment.  Visual Acuity Bilateral Distance: 20/30 (done w/o glasses) R Distance: 20/40 (w/o glasses) L Distance: 20/30 (w/o glasses)  Discussed with ophthalmology Dr. Manuella Ghazi.  Concern for possible episcleritis versus scleritis.  Subconjunctival hemorrhage seems less likely. Dr. Manuella Ghazi states to treat with p.o. ibuprofen as well as topical NSAIDs Prolensa.  He will see in the office at 1 PM on August 17.  We will also treat possible corneal abrasion at the 11 o'clock position with topical antibiotics.  Continue p.o. ibuprofen and topical NSAIDs.  Follow-up with ophthalmology as scheduled.  Return to the ED with visual changes, fever, headache, any other concerns.  Final Clinical Impressions(s) / ED Diagnoses   Final diagnoses:  Episcleritis of right eye    ED Discharge Orders    None       Gavynn Duvall, Annie Main, MD 07/02/19 3419

## 2019-07-02 MED ORDER — POLYMYXIN B-TRIMETHOPRIM 10000-0.1 UNIT/ML-% OP SOLN: 1.0000 [drp] | Freq: Four times a day (QID) | OPHTHALMIC | 0 refills | Status: AC

## 2019-07-02 MED ORDER — IBUPROFEN 400 MG PO TABS: 400.0000 mg | ORAL_TABLET | Freq: Four times a day (QID) | ORAL | 0 refills | Status: DC | PRN

## 2019-07-02 MED ORDER — PROLENSA 0.07 % OP SOLN: 1.0000 [drp] | Freq: Every day | OPHTHALMIC | 0 refills | Status: AC

## 2019-07-02 MED ORDER — PHENYLEPHRINE HCL 10 % OP SOLN
1.0000 [drp] | Freq: Once | OPHTHALMIC | Status: DC
  Filled 2019-07-02: qty 5

## 2019-07-02 NOTE — ED Notes (Signed)
Barnett Applebaum, [facility] aware of need for RX eyedrops from upstairs pharmacy.

## 2019-07-02 NOTE — Discharge Instructions (Signed)
Take the ibuprofen and use the topical drops as prescribed.  You should see the eye doctor at 1 PM on Monday, August 17.  Return to the ED if you develop visual changes, eye pain, fever, spots in her vision, pain with eye movement or any other concerns.

## 2019-07-03 DIAGNOSIS — H04123 Dry eye syndrome of bilateral lacrimal glands: Secondary | ICD-10-CM | POA: Diagnosis not present

## 2019-07-03 DIAGNOSIS — H11151 Pinguecula, right eye: Secondary | ICD-10-CM | POA: Diagnosis not present

## 2019-07-03 DIAGNOSIS — H35371 Puckering of macula, right eye: Secondary | ICD-10-CM | POA: Diagnosis not present

## 2019-07-03 DIAGNOSIS — E119 Type 2 diabetes mellitus without complications: Secondary | ICD-10-CM | POA: Diagnosis not present

## 2019-07-04 NOTE — Progress Notes (Signed)
CC'D TO PCP AND ON RECALL

## 2019-07-10 DIAGNOSIS — M79674 Pain in right toe(s): Secondary | ICD-10-CM | POA: Diagnosis not present

## 2019-07-10 DIAGNOSIS — E1149 Type 2 diabetes mellitus with other diabetic neurological complication: Secondary | ICD-10-CM | POA: Diagnosis not present

## 2019-07-10 DIAGNOSIS — M79675 Pain in left toe(s): Secondary | ICD-10-CM | POA: Diagnosis not present

## 2019-07-10 DIAGNOSIS — B351 Tinea unguium: Secondary | ICD-10-CM | POA: Diagnosis not present

## 2019-07-28 ENCOUNTER — Ambulatory Visit: Payer: Medicare Other | Admitting: Cardiology

## 2019-08-14 ENCOUNTER — Encounter: Payer: Self-pay | Admitting: Gastroenterology

## 2019-08-21 ENCOUNTER — Ambulatory Visit: Payer: Medicare Other | Admitting: Cardiology

## 2019-08-28 DIAGNOSIS — E039 Hypothyroidism, unspecified: Secondary | ICD-10-CM | POA: Diagnosis not present

## 2019-08-28 DIAGNOSIS — E119 Type 2 diabetes mellitus without complications: Secondary | ICD-10-CM | POA: Diagnosis not present

## 2019-08-28 DIAGNOSIS — I10 Essential (primary) hypertension: Secondary | ICD-10-CM | POA: Diagnosis not present

## 2019-09-04 DIAGNOSIS — I10 Essential (primary) hypertension: Secondary | ICD-10-CM | POA: Diagnosis not present

## 2019-09-04 DIAGNOSIS — E7800 Pure hypercholesterolemia, unspecified: Secondary | ICD-10-CM | POA: Diagnosis not present

## 2019-09-04 DIAGNOSIS — R05 Cough: Secondary | ICD-10-CM | POA: Diagnosis not present

## 2019-09-04 DIAGNOSIS — Z23 Encounter for immunization: Secondary | ICD-10-CM | POA: Diagnosis not present

## 2019-09-04 DIAGNOSIS — E1140 Type 2 diabetes mellitus with diabetic neuropathy, unspecified: Secondary | ICD-10-CM | POA: Diagnosis not present

## 2019-09-04 DIAGNOSIS — L309 Dermatitis, unspecified: Secondary | ICD-10-CM | POA: Diagnosis not present

## 2019-09-21 ENCOUNTER — Telehealth: Payer: Self-pay

## 2019-10-02 DIAGNOSIS — E1149 Type 2 diabetes mellitus with other diabetic neurological complication: Secondary | ICD-10-CM | POA: Diagnosis not present

## 2019-10-02 DIAGNOSIS — M79674 Pain in right toe(s): Secondary | ICD-10-CM | POA: Diagnosis not present

## 2019-10-02 DIAGNOSIS — B351 Tinea unguium: Secondary | ICD-10-CM | POA: Diagnosis not present

## 2019-10-02 DIAGNOSIS — M79675 Pain in left toe(s): Secondary | ICD-10-CM | POA: Diagnosis not present

## 2019-10-05 ENCOUNTER — Encounter: Payer: Self-pay | Admitting: Gastroenterology

## 2019-10-05 ENCOUNTER — Other Ambulatory Visit: Payer: Self-pay

## 2019-10-05 ENCOUNTER — Ambulatory Visit (INDEPENDENT_AMBULATORY_CARE_PROVIDER_SITE_OTHER): Payer: Medicare Other | Admitting: Gastroenterology

## 2019-10-05 DIAGNOSIS — K743 Primary biliary cirrhosis: Secondary | ICD-10-CM

## 2019-10-05 DIAGNOSIS — R1319 Other dysphagia: Secondary | ICD-10-CM

## 2019-10-05 DIAGNOSIS — I2510 Atherosclerotic heart disease of native coronary artery without angina pectoris: Secondary | ICD-10-CM | POA: Diagnosis not present

## 2019-10-05 DIAGNOSIS — R1310 Dysphagia, unspecified: Secondary | ICD-10-CM

## 2019-10-05 NOTE — Assessment & Plan Note (Signed)
WELL COMPENSATED DISEASE.  INCREASE VITAMIN D TO 2000 IU DAILY. REPEAT ULTRASOUND ONCE A YEAR. PLEASE CALL WITH QUESTIONS OR CONCERNS. FOLLOW UP IN 1 YEAR.

## 2019-10-05 NOTE — Patient Instructions (Addendum)
INCREASE VITAMIN D TO 2000 IU DAILY.  CONTINUE PROTONIX. TAKE 30 MINUTES PRIOR TO MEALS TWICE DAILY.  REPEAT ULTRASOUND ONCE A YEAR.  PLEASE CALL WITH QUESTIONS OR CONCERNS.  FOLLOW UP IN 1 YEAR.

## 2019-10-05 NOTE — Assessment & Plan Note (Signed)
Improved with bid PPI and likely due to uncontrolled GERD.  CONTINUE PROTONIX. TAKE 30 MINUTES PRIOR TO MEALS TWICE DAILY. PLEASE CALL WITH QUESTIONS OR CONCERNS. FOLLOW UP IN 1 YEAR.

## 2019-10-05 NOTE — Progress Notes (Signed)
Subjective:    Patient ID: [name], female    DOB: 1930/12/25, 85 y.o.   MRN: [id number]  Michele Gravel, MD  HPI Metamucil makes bowels going. A LITTLE EVERY DAY BUT MAY MISS A DOSE OR TOO. Swallowing better with INCREASING PROTONIX TO TWICE DAILY. Bms: INCE A DAY. BREATHING IS ALRIGHT NOW ON 3L O_0 . WHEN MOVES AROUND GETS WINDED. APPETITE: GOOD.  PT DENIES FEVER, CHILLS, HEMATOCHEZIA, HEMATEMESIS, nausea, vomiting, melena, diarrhea, CHEST PAIN, CHANGE IN BOWEL IN HABITS, constipation, abdominal pain, problems swallowing,  OR heartburn or indigestion.  Past Medical History:  Diagnosis Date  . Acute delirium 06/23/2013  . Acute diastolic congestive heart failure ([place]) 03/21/2014  . Anxiety   . Cancer ([place])   . Carotid artery disease (H[facility])    Bilateral ICA occlusion  . Chronic headaches   . Chronic liver disease    ?cirrhosis on 10/2014 CT. H/O elevated alkphos and +AMA on URSO.  Marland Kitchen Coronary atherosclerosis of native coronary artery    Mild nonobstructive disease at cardiac catheterization May 2011  . Depression   . Essential hypertension, benign   . Fracture of femoral neck, right, closed ([place]) 03/28/2014  . History of breast cancer   . History of GI bleed   . Hyperlipidemia   . Orthostatic hypotension    Diabetic polyneuropathy/diabetic dysautonomia   . Osteopenia   . Parotid tumor   . Pneumonia 11/2016  . Pneumonia   . Pressure ulcer, heel, right, unstageable ([place]) 04/17/2014  . PSVT (paroxysmal supraventricular tachycardia) ([place])   . Syncope and collapse 04/15/2010   Qualifier: Diagnosis of  By: Angelena Form, MD, Harrell Gave    . Type 2 diabetes mellitus ([place])   . UPPER GASTROINTESTINAL HEMORRHAGE 03/21/2007   Qualifier: Diagnosis of  By: Jonna Munro MD, Roderic Scarce      Past Surgical History:  Procedure Laterality Date  . ABDOMINAL HYSTERECTOMY    . APPENDECTOMY    . CATARACT EXTRACTION Bilateral    Dr. Gershon Crane  . CHOLECYSTECTOMY  2008  . COLONOSCOPY  2010   Dr. Oneida Alar:  single tubular adenoma, one hyperplastic polyp. Next TCS 2020 health permitting.  . ESOPHAGOGASTRODUODENOSCOPY N/A 08/17/2016   Procedure: ESOPHAGOGASTRODUODENOSCOPY (EGD);  Surgeon: Danie Binder, MD;  [facility];  Service: Endoscopy;  Laterality: N/A;  8:45 am  . ESOPHAGOGASTRODUODENOSCOPY N/A 01/01/2017   Procedure: ESOPHAGOGASTRODUODENOSCOPY (EGD);  Surgeon: Danie Binder, MD;  Location: [facility];  Service: Endoscopy;  Laterality: N/A;  9:15 am  . HEMORRHOID SURGERY  1960s  . HIP ARTHROPLASTY Right 03/28/2014   Procedure: ARTHROPLASTY BIPOLAR HIP;  Surgeon: Carole Civil, MD;  Location: [facility];  Service: Orthopedics;  Laterality: Right;  . LAPAROSCOPIC APPENDECTOMY N/A 04/13/2013   Procedure: APPENDECTOMY LAPAROSCOPIC;  Surgeon: Donato Heinz, MD;  Location: [facility];  Service: General;  Laterality: N/A;  . MASTECTOMY  1994   Left breast -related to breast cancer  . RIGHT HEART CATH N/A 04/29/2017   Procedure: Right Heart Cath;  Surgeon: Adrian Prows, MD;  Location: [facility];  Service: Cardiovascular;  Laterality: N/A;  . RIGHT/LEFT HEART CATH AND CORONARY ANGIOGRAPHY N/A 04/13/2017   Procedure: RIGHT/LEFT HEART CATH AND CORONARY ANGIOGRAPHY;  Surgeon: Adrian Prows, MD;  Location: [facility];  Service: Cardiovascular;  Laterality: N/A;  . SAVORY DILATION N/A 08/17/2016   Procedure: SAVORY DILATION;  Surgeon: Danie Binder, MD;  [facility];  Service: Endoscopy;  Laterality: N/A;  . VESICOVAGINAL FISTULA CLOSURE W/  TAH  1979  . YAG LASER APPLICATION Left    Dr. Zadie Rhine    Allergies  Allergen Reactions  . Aricept [Donepezil Hcl]     Hives, vomiting and headache  . Namenda [Memantine Hcl]     hives  . Adcirca [Tadalafil] Other (See Comments)    Caused blindness in one eye  . Donepezil Nausea Only  . Spironolactone Other (See Comments)    Dizziness, balance problems  . Codeine Rash  . Indocin [Indomethacin] Rash  . Latex Rash  .  Penicillins Rash   Current Outpatient Medications  Medication Sig    . acetaminophen (TYLENOL) 500 MG tablet Take 500 mg by mouth every 8 (eight) hours as needed for mild pain or moderate pain.    Marland Kitchen ALPRAZolam (XANAX) 0.25 MG tablet Take 0.25 mg by mouth 2 (two) times daily as needed for anxiety.    Marland Kitchen atorvastatin (LIPITOR) 10 MG tablet Take 1 tablet (10 mg total) by mouth daily.    . cholecalciferol (VITAMIN D) 1000 UNITS tablet Take 1,000 Units by mouth every morning.     . citalopram (CELEXA) 20 MG tablet Take 1 tablet by mouth daily.    . clopidogrel (PLAVIX) 75 MG tablet Take 75 mg by mouth daily.    . clotrimazole-betamethasone (LOTRISONE) cream Apply 1 application topically 2 (two) times daily as needed (Eczema).     . docusate sodium (COLACE) 100 MG capsule Take 100 mg by mouth 2 (two) times daily.    . feeding supplement, GLUCERNA SHAKE, (GLUCERNA SHAKE) LIQD Take 237 mLs by mouth daily as needed.     . furosemide (LASIX) 40 MG tablet Take 1.5 tablets (60 mg total) by mouth daily.    . Glycerin-Polysorbate 80 (REFRESH DRY EYE THERAPY OP) Place 1 drop into both eyes 2 (two) times daily as needed.    . hydrALAZINE (APRESOLINE) 10 MG tablet Take 10 mg by mouth 2 (two) times daily.    . insulin aspart (NOVOLOG FLEXPEN) 100 UNIT/ML FlexPen Inject 5 Units into the skin 3 (three) times daily with meals. When BG is over 100     . Insulin Glargine (LANTUS SOLOSTAR) 100 UNIT/ML Solostar Pen Inject 8 Units into the skin at bedtime.    Marland Kitchen loratadine (CLARITIN) 10 MG tablet Take 10 mg by mouth daily.    . macitentan (OPSUMIT) 10 MG tablet Take 1 tablet (10 mg total) by mouth daily.    . Methylfol-Algae-B12-Acetylcyst (CEREFOLIN NAC) 6-90.314-2-600 MG TABS Take 1 tablet by mouth daily.     . metoprolol (LOPRESSOR) 50 MG tablet Take 32m in the a.m.and 232min the pm (Patient taking differently: Take 12.5 mg by mouth 2 (two) times daily. )    . mirtazapine (REMERON) 30 MG tablet Take 30 mg by mouth at  bedtime.    . Multiple Vitamin (MULTIVITAMIN) tablet Take 1 tablet by mouth daily. Reported on 02/06/2016    . olmesartan (BENICAR) 20 MG tablet Take 5 mg by mouth daily.    . pantoprazole (PROTONIX) 40 MG tablet TAKE 1 TABLET TWICE A DAY BEFORE MEALS    . Polyethyl Glycol-Propyl Glycol (SYSTANE OP) Apply to eye as needed.    . potassium chloride (K-DUR,KLOR-CON) 10 MEQ tablet Take 10 mEq by mouth daily.     . psyllium (METAMUCIL SMOOTH TEXTURE) 28 % packet Take 1 packet by mouth as needed.     . thyroid (ARMOUR) 15 MG tablet Take 15 mg by mouth daily.    . Marland Kitchenmeclidinium bromide (  INCRUSE ELLIPTA) 62.5 MCG/INH AEPB Inhale 1 puff into the lungs daily.    . ursodiol (ACTIGALL) 500 MG tablet TAKE 1 TABLET TWICE A DAY WITH MEALS    . hydrALAZINE (APRESOLINE) 10 MG tablet Take 1 tablet by mouth 2 (two) times a day.    .      .      .       Review of Systems PER HPI OTHERWISE ALL SYSTEMS ARE NEGATIVE.    Objective:   Physical Exam Constitutional:      General: She is not in acute distress.    Appearance: Normal appearance.  HENT:     Mouth/Throat:     Comments: MASK IN PLACE Eyes:     General: No scleral icterus.    Pupils: Pupils are equal, round, and reactive to light.  Neck:     Musculoskeletal: Normal range of motion.  Cardiovascular:     Rate and Rhythm: Normal rate and regular rhythm.     Pulses: Normal pulses.     Heart sounds: Normal heart sounds.  Pulmonary:     Effort: Pulmonary effort is normal.     Breath sounds: Normal breath sounds.  Abdominal:     General: Bowel sounds are normal.     Palpations: Abdomen is soft.     Tenderness: There is no abdominal tenderness.  Musculoskeletal:     Right lower leg: No edema.     Left lower leg: No edema.  Lymphadenopathy:     Cervical: No cervical adenopathy.  Skin:    General: Skin is warm and dry.  Neurological:     Mental Status: She is alert and oriented to person, place, and time.     Comments: NO  NEW FOCAL DEFICITS   Psychiatric:        Mood and Affect: Mood normal.     Comments: NORMAL AFFECT       Assessment & Plan:

## 2019-10-17 ENCOUNTER — Other Ambulatory Visit: Payer: Self-pay | Admitting: Cardiology

## 2019-10-25 ENCOUNTER — Other Ambulatory Visit: Payer: Self-pay

## 2019-10-25 DIAGNOSIS — I471 Supraventricular tachycardia: Secondary | ICD-10-CM

## 2019-10-25 MED ORDER — MACITENTAN 10 MG PO TABS: 10.0000 mg | ORAL_TABLET | Freq: Every day | ORAL | 3 refills | Status: DC

## 2019-11-01 ENCOUNTER — Other Ambulatory Visit: Payer: Self-pay

## 2019-11-01 ENCOUNTER — Telehealth: Payer: Self-pay

## 2019-11-01 MED ORDER — OLMESARTAN MEDOXOMIL 5 MG PO TABS: 5.0000 mg | ORAL_TABLET | Freq: Every day | ORAL | 1 refills | Status: DC

## 2019-11-01 NOTE — Telephone Encounter (Signed)
Pt's daughter [name] called stating that there was some confusion with [facility] as far as the sig on the Olmesartan. Pt is prescribed a 1m but only takes 532m She is cuting the tab in 4's due to pt's tolerance. Could not tolerate 1065m1/2 tab) so cut again to take 5mg19merbally discussed with [place] to see if ok to send in a 5mg 71m and she ok'd. KarenSantiago Glade of 5mg s22m to [facility] to see if they will approve.//ah

## 2019-11-02 ENCOUNTER — Other Ambulatory Visit: Payer: Self-pay | Admitting: *Deleted

## 2019-11-02 NOTE — Patient Outreach (Addendum)
[facility]) Care Management  11/02/2019  [name] 1931/11/03 [id number]   RN Health Coach attempted outreach screening call to patient.  Patient was unavailable. HIPPA compliance voicemail message left with return callback number.  Plan: RN will call patient again within 10 days.  [place] [facility] Management [phone]

## 2019-11-02 NOTE — Patient Outreach (Addendum)
[facility]) Care Management  [place]  11/02/2019   [name] November 02, 1931 [id number]  [place] received return  telephone call from  Patient and daughter.  Hipaa compliance verified. Daughter had patient on speaker phone. Patient has pulmonary HTN, CHF, DM that daughter will like to have addressed and additional education on. RN discussed starting with the hypertension and progressing through the other dx. Patient checks her blood pressure, wt,temp and oxygen daily. Patient walks around in the home with no assistance but needs a cane or walker when going out. Daughter [name] is the caregiver. Family assists and does the errands. Patient appetite is good. Patient and daughter agreed to advance directive packet being sent to them. Patient and daughter has agreed to follow up outreach calls.  Encounter Medications:  Outpatient Encounter Medications as of 11/02/2019  Medication Sig  . acetaminophen (TYLENOL) 500 MG tablet Take 500 mg by mouth every 8 (eight) hours as needed for mild pain or moderate pain.  Marland Kitchen ALPRAZolam (XANAX) 0.25 MG tablet Take 0.25 mg by mouth 2 (two) times daily as needed for anxiety.  Marland Kitchen atorvastatin (LIPITOR) 10 MG tablet Take 1 tablet (10 mg total) by mouth daily.  . cholecalciferol (VITAMIN D) 1000 UNITS tablet Take 1,000 Units by mouth every morning.   . citalopram (CELEXA) 20 MG tablet Take 1 tablet by mouth daily.  . clopidogrel (PLAVIX) 75 MG tablet Take 75 mg by mouth daily.  . clotrimazole-betamethasone (LOTRISONE) cream Apply 1 application topically 2 (two) times daily as needed (Eczema).   . docusate sodium (COLACE) 100 MG capsule Take 100 mg by mouth 2 (two) times daily.  . feeding supplement, GLUCERNA SHAKE, (GLUCERNA SHAKE) LIQD Take 237 mLs by mouth daily as needed.   . furosemide (LASIX) 40 MG tablet Take 1.5 tablets (60 mg total) by mouth daily.  . Glycerin-Polysorbate 80 (REFRESH DRY EYE THERAPY OP) Place 1 drop into both  eyes 2 (two) times daily as needed.  . hydrALAZINE (APRESOLINE) 10 MG tablet Take 1 tablet by mouth 2 (two) times a day.  . hydrALAZINE (APRESOLINE) 10 MG tablet Take 10 mg by mouth 2 (two) times daily.  . hydrALAZINE (APRESOLINE) 25 MG tablet Take 1 tablet (25 mg total) by mouth 2 (two) times daily. (Patient not taking: Reported on 04/20/2019)  . ibuprofen (ADVIL) 400 MG tablet Take 1 tablet (400 mg total) by mouth every 6 (six) hours as needed. (Patient not taking: Reported on 10/05/2019)  . insulin aspart (NOVOLOG FLEXPEN) 100 UNIT/ML FlexPen Inject 5 Units into the skin 3 (three) times daily with meals. When BG is over 100   . Insulin Glargine (LANTUS SOLOSTAR) 100 UNIT/ML Solostar Pen Inject 8 Units into the skin at bedtime.  Marland Kitchen loratadine (CLARITIN) 10 MG tablet Take 10 mg by mouth daily.  . macitentan (OPSUMIT) 10 MG tablet Take 1 tablet (10 mg total) by mouth daily.  . Methylfol-Algae-B12-Acetylcyst (CEREFOLIN NAC) 6-90.314-2-600 MG TABS Take 1 tablet by mouth daily.   . metoprolol (LOPRESSOR) 50 MG tablet Take 44m in the a.m.and 215min the pm (Patient taking differently: Take 12.5 mg by mouth 2 (two) times daily. )  . mirtazapine (REMERON) 30 MG tablet Take 30 mg by mouth at bedtime.  . Multiple Vitamin (MULTIVITAMIN) tablet Take 1 tablet by mouth daily. Reported on 02/06/2016  . olmesartan (BENICAR) 20 MG tablet Take 5 mg by mouth daily.  . Marland Kitchenlmesartan (BENICAR) 5 MG tablet Take 1 tablet (5 mg total) by  mouth daily.  . pantoprazole (PROTONIX) 40 MG tablet TAKE 1 TABLET TWICE A DAY BEFORE MEALS  . Polyethyl Glycol-Propyl Glycol (SYSTANE OP) Apply to eye as needed.  . potassium chloride (KLOR-CON) 10 MEQ tablet TAKE 1 TABLET   DAILY AS DIRECTED WITH FUROSEMIDE  . psyllium (METAMUCIL SMOOTH TEXTURE) 28 % packet Take 1 packet by mouth as needed.   . saccharomyces boulardii (FLORASTOR) 250 MG capsule Take 1 capsule (250 mg total) by mouth 2 (two) times daily. (Patient not taking: Reported on  04/25/2019)  . thyroid (ARMOUR) 15 MG tablet Take 15 mg by mouth daily.  Marland Kitchen umeclidinium bromide (INCRUSE ELLIPTA) 62.5 MCG/INH AEPB Inhale 1 puff into the lungs daily.  . ursodiol (ACTIGALL) 500 MG tablet TAKE 1 TABLET TWICE A DAY WITH MEALS   No facility-administered encounter medications on file as of 11/02/2019.    Functional Status:  In your present state of health, do you have any difficulty performing the following activities: 11/02/2019  Hearing? N  Vision? N  Difficulty concentrating or making decisions? N  Walking or climbing stairs? Y  Comment uses a cane if long walks or walker  Dressing or bathing? N  Doing errands, shopping? Y  Comment Family goes with patient on errands  Preparing Food and eating ? N  Using the Toilet? N  In the past six months, have you accidently leaked urine? N  Do you have problems with loss of bowel control? N  Managing your Medications? N  Housekeeping or managing your Housekeeping? Y  Comment Daughter assists  Some recent data might be hidden    Fall/Depression Screening: Fall Risk  11/02/2019 07/14/2016  Falls in the past year? 0 No  Comment - [place] Telephone Survey: data to providers prior to load  Number falls in past yr: 0 -  Injury with Fall? 0 -  Risk for fall due to : Impaired mobility;Impaired balance/gait -  Risk for fall due to: Comment uses a cane or walker sometimes outside the house -  Follow up Falls evaluation completed -   PHQ 2/9 Scores 11/02/2019  PHQ - 2 Score 0   THN CM Care Plan Problem One     Most Recent Value  Care Plan Problem One  Knowledge Deficit in Self Management of Hypertension  Role Documenting the Problem One  [place] for Problem One  Active  [facility] Long Term Goal   Patient and daughter will be able to verbalize having a better understanding of hypertension within the next 90 days  THN Long Term Goal Start Date  11/02/19  Interventions for Problem One Long Term Goal  RN discussed  hypertension with patient and care giver. RN sent clinical key education on Pulmonary hypertension. RN sent A matterof choice blood pressure control booklet. RN will follow up with further discussion  THN CM Short Term Goal #1   Patient and daughter will verbalize receiving Advance directive packet within the next 30 days  THN CM Short Term Goal #1 Start Date  11/02/19  Interventions for Short Term Goal #1  RN discussed what the advance directive is. Patient and family agreed to receive packet. RN sent packet and will follow up for further questions  THN CM Short Term Goal #2   Patient and daughter will verbalize having a better understandiong of low and high sodium foods within the next 30 days  THN CM Short Term Goal #2 Start Date  11/02/19  Interventions for Short Term Goal #2  RN discussed low sodium foods and sent a picture sheet of foods high and low in sodium and how to read the labels. RN will follow up with further discussion  THN CM Short Term Goal #3  Patient will moitor blood pressure and document in calendar book within the next 30 days  THN CM Short Term Goal #3 Start Date  11/02/19  Interventions for Short Tern Goal #3  RN discussed monitoring and documenting the vitals signs she is taking in a book. Patient checks her BP, O2 sat, Temperature and weight every day. RN provided a 2021 calendar book to keep all information in one place. RN will follow up for further discussion      Assessment:  Patient checks BP,wt,O2 sat and temp daily Patient uses a cane or walker when ambulating outside of home Patient and daughter agreed to receiving an Advance Directive Packet Patient and daughter asked for information on Pulmonary Hyperrtension Patient and daughter will benefit from Health Coach telephonic outreach for education and support for Hypertension self management.  Plan:  RN discussed Advance Directive RN sent Advance Directive Packet RN discussed hypertension RN sent A Matter of  Choice Blood Pressure control booklet RN went a Engineer, materials sent a picture sheet of foods high and low in Sodium 2021 Calendar book Clinical Key education on Pulmonary hypertension Assessment and Barrier Letter to PCP  [place] Management [phone]

## 2019-11-07 ENCOUNTER — Ambulatory Visit: Payer: Medicare Other | Admitting: *Deleted

## 2019-11-13 ENCOUNTER — Other Ambulatory Visit: Payer: Self-pay

## 2019-11-13 DIAGNOSIS — I471 Supraventricular tachycardia: Secondary | ICD-10-CM

## 2019-11-13 MED ORDER — MACITENTAN 10 MG PO TABS: 10.0000 mg | ORAL_TABLET | Freq: Every day | ORAL | 3 refills | Status: DC

## 2019-11-13 MED ORDER — OLMESARTAN MEDOXOMIL 5 MG PO TABS: 5.0000 mg | ORAL_TABLET | Freq: Every day | ORAL | 0 refills | Status: DC

## 2019-11-14 ENCOUNTER — Other Ambulatory Visit: Payer: Self-pay

## 2019-11-14 DIAGNOSIS — I471 Supraventricular tachycardia: Secondary | ICD-10-CM

## 2019-11-14 MED ORDER — MACITENTAN 10 MG PO TABS: 10.0000 mg | ORAL_TABLET | Freq: Every day | ORAL | 3 refills | Status: DC

## 2019-11-20 ENCOUNTER — Telehealth: Payer: Self-pay | Admitting: Cardiology

## 2019-11-21 ENCOUNTER — Telehealth (INDEPENDENT_AMBULATORY_CARE_PROVIDER_SITE_OTHER): Payer: Medicare Other | Admitting: Cardiology

## 2019-11-21 ENCOUNTER — Other Ambulatory Visit: Payer: Self-pay

## 2019-11-21 ENCOUNTER — Encounter: Payer: Self-pay | Admitting: Cardiology

## 2019-11-21 VITALS — BP 129/48 | HR 65 | Temp 97.4°F | Resp 16 | Ht 64.0 in | Wt 148.0 lb

## 2019-11-21 DIAGNOSIS — I10 Essential (primary) hypertension: Secondary | ICD-10-CM

## 2019-11-21 DIAGNOSIS — K7469 Other cirrhosis of liver: Secondary | ICD-10-CM

## 2019-11-21 DIAGNOSIS — I5032 Chronic diastolic (congestive) heart failure: Secondary | ICD-10-CM

## 2019-11-21 DIAGNOSIS — I270 Primary pulmonary hypertension: Secondary | ICD-10-CM

## 2019-11-21 NOTE — Progress Notes (Signed)
Virtual Visit via Telephone Note: Patient unable to use video assisted device.  This visit type was conducted due to national recommendations for restrictions regarding the COVID-19 Pandemic (e.g. social distancing).  This format is felt to be most appropriate for this patient at this time.  All issues noted in this document were discussed and addressed.  No physical exam was performed.  The patient has consented to conduct a Telehealth visit and understands insurance will be billed.   I connected with@, on 11/21/19 at  by TELEPHONE and verified that I am speaking with the correct person using two identifiers.   I discussed the limitations of evaluation and management by telemedicine and the availability of in person appointments. The patient expressed understanding and agreed to proceed.   I have discussed with patient regarding the safety during COVID Pandemic and steps and precautions to be taken including social distancing, frequent hand wash and use of detergent soap, gels with the patient. I asked the patient to avoid touching mouth, nose, eyes, ears with the hands. I encouraged regular walking around the neighborhood and exercise and regular diet, as long as social distancing can be maintained.   Primary Physician/Referring:  [name], MD  Patient ID: [name], female    DOB: [birth year], 86 y.o.   MRN: [id number]  Chief Complaint  Patient presents with  . Congestive Heart Failure    Pulmonary hypertension  . Cirrhosis   HPI: [name]  is a 86 y.o. female  with primary pulmonary hypertension, cryptogenic cirrhosis of the liver, presently on Opsumit since July 2018 and now on Adcirca. She was evaluated by Salome Holmes by Dr. Suzi Roots in November 2018 who recommended continuing present medical therapy and to consider addition of Selexipag if disease progresses.  She has history of breast cancer status post radical mastectomy and chemotherapy, well controlled diabetes  mellitus, moderate coronary artery disease and bilateral carotid artery occlusion without stroke. 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  She is presently doing well and states that her dyspnea is stable.  She has not used extra doses of diuretics, recent GI evaluation she was told that the ascites has completely resolved and liver function has stabilized and to see them back in a year.  She also states that recent blood work by her PCP but all within normal limits including renal function and hepatic function.  She has developed diarrhea for the past 3 days but is improving.  Her daughter is present.  Past Medical History:  Diagnosis Date  . Acute delirium 06/23/2013  . Acute diastolic congestive heart failure ([place]) 03/21/2014  . Anxiety   . Cancer ([place])   . Carotid artery disease (HCC)    Bilateral ICA occlusion  . Chronic headaches   . Chronic liver disease    ?cirrhosis on 10/2014 CT. H/O elevated alkphos and +AMA on URSO.  Marland Kitchen Coronary atherosclerosis of native coronary artery    Mild nonobstructive disease at cardiac catheterization May 2011  . Depression   . Essential hypertension, benign   . Fracture of femoral neck, right, closed ([place]) 03/28/2014  . History of breast cancer   . History of GI bleed   . Hyperlipidemia   . Orthostatic hypotension    Diabetic polyneuropathy/diabetic  dysautonomia   . Osteopenia   . Parotid tumor   . Pneumonia 11/2016  . Pneumonia   . Pressure ulcer, heel, right, unstageable ([place]) 04/17/2014  . PSVT (paroxysmal supraventricular tachycardia) ([place])   . Syncope and collapse 04/15/2010   Qualifier: Diagnosis of  By: Angelena Form, MD, Harrell Gave    . Type 2 diabetes mellitus ([place])   . UPPER  GASTROINTESTINAL HEMORRHAGE 03/21/2007   Qualifier: Diagnosis of  By: Jonna Munro MD, Roderic Scarce      Past Surgical History:  Procedure Laterality Date  . ABDOMINAL HYSTERECTOMY    . APPENDECTOMY    . CATARACT EXTRACTION Bilateral    Dr. Gershon Crane  . CHOLECYSTECTOMY  2008  . COLONOSCOPY  2010   Dr. Oneida Alar: single tubular adenoma, one hyperplastic polyp. Next TCS 2020 health permitting.  . ESOPHAGOGASTRODUODENOSCOPY N/A 08/17/2016   Procedure: ESOPHAGOGASTRODUODENOSCOPY (EGD);  Surgeon: Danie Binder, MD;  [facility];  Service: Endoscopy;  Laterality: N/A;  8:45 am  . ESOPHAGOGASTRODUODENOSCOPY N/A 01/01/2017   Procedure: ESOPHAGOGASTRODUODENOSCOPY (EGD);  Surgeon: Danie Binder, MD;  [facility];  Service: Endoscopy;  Laterality: N/A;  9:15 am  . HEMORRHOID SURGERY  1960s  . HIP ARTHROPLASTY Right 03/28/2014   Procedure: ARTHROPLASTY BIPOLAR HIP;  Surgeon: Carole Civil, MD;  Location: [facility];  Service: Orthopedics;  Laterality: Right;  . LAPAROSCOPIC APPENDECTOMY N/A 04/13/2013   Procedure: APPENDECTOMY LAPAROSCOPIC;  Surgeon: Donato Heinz, MD;  Location: [facility];  Service: General;  Laterality: N/A;  . MASTECTOMY  1994   Left breast -related to breast cancer  . RIGHT HEART CATH N/A 04/29/2017   Procedure: Right Heart Cath;  Surgeon: Adrian Prows, MD;  Location: [place] [facility];  Service: Cardiovascular;  Laterality: N/A;  . RIGHT/LEFT HEART CATH AND CORONARY ANGIOGRAPHY N/A 04/13/2017   Procedure: RIGHT/LEFT HEART CATH AND CORONARY ANGIOGRAPHY;  Surgeon: Adrian Prows, MD;  Location: [place] [facility];  Service: Cardiovascular;  Laterality: N/A;  . SAVORY DILATION N/A 08/17/2016   Procedure: SAVORY DILATION;  Surgeon: Danie Binder, MD;  [facility];  Service: Endoscopy;  Laterality: N/A;  . VESICOVAGINAL FISTULA CLOSURE W/ TAH  1979  . YAG LASER APPLICATION Left    Dr. Zadie Rhine    Social History   Socioeconomic History  . Marital status: Divorced      Spouse name: Not on file  . Number of children: 4  . Years of education: Not on file  . Highest education level: Not on file  Occupational History  . Occupation: RETIRED FACTORY WORKER    Comment: [employer]  Tobacco Use  . Smoking status: Former Smoker    Packs/day: 0.50    Years: 40.00    Pack years: 20.00    Types: Cigarettes    Quit date: 03/30/2013    Years since quitting: 6.6  . Smokeless tobacco: Never Used  . Tobacco comment: Quit since 03/2013  Substance and Sexual Activity  . Alcohol use: No    Alcohol/week: 0.0 standard drinks  . Drug use: No  . Sexual activity: Never  Other Topics Concern  . 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 Started age 21.   Drug use -no   Alcohol use - no   Lives with son  Education - 12 th grade                  Social Determinants of Health   Financial Resource Strain:   . Difficulty of Paying Living Expenses: Not on file  Food Insecurity:   . Worried About Charity fundraiser in the Last Year: Not on file  . Ran Out of Food in the Last Year: Not on file  Transportation Needs:   . Lack of Transportation (Medical): Not on file  . Lack of Transportation (Non-Medical): Not on file  Physical Activity:   . Days of Exercise per Week: Not on file  . Minutes of Exercise per Session: Not on file  Stress:   . Feeling of Stress : Not on file  Social Connections:   . Frequency of Communication with Friends and Family: Not on file  . Frequency of Social Gatherings with Friends and Family: Not on file  . Attends Religious Services: Not on file  . Active Member of Clubs or Organizations: Not on file  . Attends Archivist Meetings: Not on file  . Marital Status: Not on file  Intimate Partner Violence:   . Fear of Current or  Ex-Partner: Not on file  . Emotionally Abused: Not on file  . Physically Abused: Not on file  . Sexually Abused: Not on file    Review of Systems  Constitution: Negative for chills, decreased appetite, malaise/fatigue and weight gain.  Eyes: Positive for blurred vision.  Cardiovascular: Positive for dyspnea on exertion. Negative for leg swelling and syncope.  Endocrine: Negative for cold intolerance.  Hematologic/Lymphatic: Does not bruise/bleed easily.  Musculoskeletal: Positive for muscle weakness (uses cane for support at home and uses walker when she goes out). Negative for joint swelling.  Gastrointestinal: Positive for diarrhea (4 days ago). Negative for abdominal pain, anorexia, change in bowel habit, hematochezia and melena.  Neurological: Negative for headaches and light-headedness.  Psychiatric/Behavioral: Negative for depression and substance abuse.  All other systems reviewed and are negative.  Objective  Blood pressure (!) 129/48, pulse 65, temperature (!) 97.4 F (36.3 C), temperature source Temporal, resp. rate 16, height _0  (1.626 m), weight 148 lb (67.1 kg), SpO2 93 %. Body mass index is 25.4 kg/m. Vitals with BMI 11/21/2019 10/05/2019 07/01/2019  Height _1  _2  _3   Weight 148 lbs 152 lbs 13 oz 149 lbs  BMI 25.39 00.17 49.44  Systolic 967 591 638  Diastolic 48 70 62  Pulse 65 75 68    Physical exam not performed or limited due to virtual visit.   Please see exam details from prior visit is as below.  Physical Exam  Constitutional: She appears well-developed and well-nourished. No distress.  HENT:  Head: Atraumatic.  Eyes: Conjunctivae are normal.  Neck: No JVD present. No thyromegaly present.  Cardiovascular: Normal rate, regular rhythm and intact distal pulses. Exam reveals no gallop.  Murmur heard. High-pitched blowing holosystolic murmur is present with a grade of 2/6 at the lower left sternal border. Normal S2, loud X2  Pulmonary/Chest: Effort  normal and breath sounds normal.  Abdominal: Soft. Bowel sounds are normal.  Musculoskeletal:        General: No edema. Normal range of motion.     Cervical back: Neck supple.  Neurological: She is alert.  Skin: Skin is warm and dry.  Psychiatric: She has a normal mood and affect.   Radiology: No results found.  Laboratory examination:   Labs 06/23/2013: ANA  positive. Hepatitis B and C negative. Negative for anti-smooth muscle antibody, mitochondrial antibody.  Labs 07/20/2018: Serum glucose 118 mg, BUN 12, creatinine 0.60, eGFR greater than 60 mL. CMP otherwise normal. HB 11.2/HCT 34.0, platelets 196. Normal indicis.  02/17/2018: WBC 3.0, RBC 3.6, normal hemoglobin, hematocrit 36.6, MCH 33.9, CBC otherwise normal. Creatinine 0.8, EGFR 69/84, potassium 4.7, CMP normal. Hemoglobin A1c 6%. Cholesterol 163, triglycerides 84, HDL 69, LDL 77.   CMP Latest Ref Rng & Units 05/08/2019 12/26/2018 10/26/2018  Glucose 65 - 139 mg/dL - 167(H) -  BUN 7 - 25 mg/dL - 17 -  Creatinine 0.44 - 1.00 mg/dL 0.80 0.81 0.70  Sodium 135 - 146 mmol/L - 135 -  Potassium 3.5 - 5.3 mmol/L - 4.2 -  Chloride 98 - 110 mmol/L - 96(L) -  CO2 20 - 32 mmol/L - 33(H) -  Calcium 8.6 - 10.4 mg/dL - 9.3 -  Total Protein 6.1 - 8.1 g/dL - 7.4 -  Total Bilirubin 0.2 - 1.2 mg/dL - 0.5 -  Alkaline Phos 38 - 126 U/L - - -  AST 10 - 35 U/L - 19 -  ALT 6 - 29 U/L - 10 -   CBC Latest Ref Rng & Units 12/26/2018 07/20/2018 07/18/2018  WBC 3.8 - 10.8 Thousand/uL 4.1 4.0 3.9(L)  Hemoglobin 11.7 - 15.5 g/dL 12.1 11.2(L) 11.8(L)  Hematocrit 35.0 - 45.0 % 35.7 34.0(L) 34.8(L)  Platelets 140 - 400 Thousand/uL 219 196 222   Lipid Panel     Component Value Date/Time   CHOL 165 01/02/2016 1100   TRIG 106 01/02/2016 1100   HDL 58 01/02/2016 1100   CHOLHDL 2.8 01/02/2016 1100   VLDL 21 01/02/2016 1100   LDLCALC 86 01/02/2016 1100   HEMOGLOBIN A1C Lab Results  Component Value Date   HGBA1C 7.9 (H) 01/02/2016   MPG 180 01/02/2016    TSH No results for input(s): TSH in the last 8760 hours.  PRN Meds:. Medications Discontinued During This Encounter  Medication Reason  . hydrALAZINE (APRESOLINE) 25 MG tablet Patient Preference  . hydrALAZINE (APRESOLINE) 10 MG tablet Duplicate  . ibuprofen (ADVIL) 400 MG tablet Patient has not taken in last 30 days  . saccharomyces boulardii (FLORASTOR) 250 MG capsule Patient Preference   Current Meds  Medication Sig  . acetaminophen (TYLENOL) 500 MG tablet Take 500 mg by mouth every 8 (eight) hours as needed for mild pain or moderate pain.  Marland Kitchen ALPRAZolam (XANAX) 0.25 MG tablet Take 0.25 mg by mouth 2 (two) times daily as needed for anxiety.  Marland Kitchen atorvastatin (LIPITOR) 10 MG tablet Take 1 tablet (10 mg total) by mouth daily.  . cholecalciferol (VITAMIN D) 1000 UNITS tablet Take 2,000 Units by mouth every morning.  . citalopram (CELEXA) 20 MG tablet Take 1 tablet by mouth daily.  . clopidogrel (PLAVIX) 75 MG tablet Take 75 mg by mouth daily.  . clotrimazole-betamethasone (LOTRISONE) cream Apply 1 application topically 2 (two) times daily as needed (Eczema).   . docusate sodium (COLACE) 100 MG capsule Take 100 mg by mouth 2 (two) times daily.  . feeding supplement, GLUCERNA SHAKE, (GLUCERNA SHAKE) LIQD Take 237 mLs by mouth daily as needed.   . furosemide (LASIX) 40 MG tablet Take 1.5 tablets (60 mg total) by mouth daily.  . Glycerin-Polysorbate 80 (REFRESH DRY EYE THERAPY OP) Place 1 drop into both eyes 2 (two) times daily as needed.  . hydrALAZINE (APRESOLINE) 10 MG tablet Take 1 tablet by mouth 2 (two) times a day.  Marland Kitchen  insulin aspart (NOVOLOG FLEXPEN) 100 UNIT/ML FlexPen Inject 5 Units into the skin 3 (three) times daily with meals. When BG is over 100   . Insulin Glargine (LANTUS SOLOSTAR) 100 UNIT/ML Solostar Pen Inject 9 Units into the skin at bedtime.  Marland Kitchen loratadine (CLARITIN) 10 MG tablet Take 10 mg by mouth daily.  . macitentan (OPSUMIT) 10 MG tablet Take 1 tablet (10 mg total) by  mouth daily.  . Methylfol-Algae-B12-Acetylcyst (CEREFOLIN NAC) 6-90.314-2-600 MG TABS Take 1 tablet by mouth daily.   . metoprolol (LOPRESSOR) 50 MG tablet Take 15m in the a.m.and 244min the pm (Patient taking differently: Take 12.5 mg by mouth 2 (two) times daily. )  . mirtazapine (REMERON) 30 MG tablet Take 30 mg by mouth at bedtime.  . Multiple Vitamin (MULTIVITAMIN) tablet Take 1 tablet by mouth daily. Reported on 02/06/2016  . olmesartan (BENICAR) 5 MG tablet Take 1 tablet (5 mg total) by mouth daily.  . pantoprazole (PROTONIX) 40 MG tablet TAKE 1 TABLET TWICE A DAY BEFORE MEALS  . Polyethyl Glycol-Propyl Glycol (SYSTANE OP) Apply to eye as needed.  . potassium chloride (KLOR-CON) 10 MEQ tablet TAKE 1 TABLET   DAILY AS DIRECTED WITH FUROSEMIDE  . psyllium (METAMUCIL SMOOTH TEXTURE) 28 % packet Take 1 packet by mouth as needed.   . thyroid (ARMOUR) 15 MG tablet Take 15 mg by mouth daily.  . Marland Kitchenmeclidinium bromide (INCRUSE ELLIPTA) 62.5 MCG/INH AEPB Inhale 1 puff into the lungs daily.  . ursodiol (ACTIGALL) 500 MG tablet TAKE 1 TABLET TWICE A DAY WITH MEALS    Cardiac Studies:   Carotid artery duplex [id number] internal carotid artery flow appears to be sluggish and may suggest complete occlusion. Left vertebral artery flow is undetectable (no flow). Antegrade right vertebral artery flow. Consider different modality of imaging to better define anatomy.  Echocardiogram 10/21/2018: 1. Left ventricle cavity is normal in size. Mild concentric hypertrophy of the left ventricle. Mild flattening of septum due to elevated RV pressure Normal global wall motion. Doppler evidence of grade III (restrictive) diastolic dysfunction, elevated LAP. Calculated EF 64%. 2. Left atrial cavity is mild to moderately dilated. 3. Right atrial cavity is mild to moderately dilated. 4. Trileaflet aortic valve. Mild aortic valve leaflet calcification. 5. Mild (Grade I) mitral regurgitation. Mild  calcification of the mitral valve annulus. 6. Moderate tricuspid regurgitation. Moderate pulmonary hypertension with approx. PA syst. pressure of 50 mm of Hg. 7. Moderate pulmonic regurgitation. 8. Trace pericardial effusion. 9. c.f. echo. of 05/12/2018, there is improvement in PA pressure. Pul. HTN is moderate now, it was severe before. No regional wall motion abnormalities of LV. The RV size is normal.   Right Heart Cath 04/29/2017, RA pressure 8/2, mean 2 mmHg. RV 78/1, EDP 3 mmHg. PA 80/18, mean 42 mmHg. PW 13/14, mean 11 mmHg. PA saturation was 73%, aortic saturation 98%. Cardiac output 4.88, cardiac index 2.85 by Fick.  SIX MIN WALK 04/25/2019 04/25/2019  Supplimental Oxygen during Test? (L/min) Yes -  Type Continuous -  Laps 6 6  Partial Lap (in Meters) 0 -  Baseline Heartrate 69 -  Baseline SPO2 94 -  Heartrate 102 -  SPO2 91 -  Heartrate 83 -  SPO2 92 -  Stopped or Paused before Six Minutes No -  Distance Completed 204 -  Tech Comments: pt walked 6 laps in 6 minutes -  Total distance 120 meters.   Assessment     ICD-10-CM   1. Primary pulmonary hypertension ([place]  I27.0 PCV ECHOCARDIOGRAM COMPLETE  2. Diastolic CHF, chronic (HCC)  I50.32 PCV ECHOCARDIOGRAM COMPLETE  3. Cryptogenic cirrhosis of liver ([place])  K74.69   4. Essential hypertension  I10     EKG 04/25/2019: Sinus rhythm with first-degree AV block at the rate of 65 bpm, left atrial abnormality, rightward axis, incomplete right bundle branch block.  Consider RVH.  Normal QT interval.  Recommendations:   [name]  is a 86 y.o. female  with primary pulmonary hypertension, cryptogenic cirrhosis of the liver, presently on Opsumit since July 2018 and now on Adcirca. She was evaluated by Salome Holmes by Dr. Suzi Roots in November 2018 who recommended continuing present medical therapy and to consider addition of Selexipag if disease progresses.  She has history of breast cancer status post radical mastectomy and  chemotherapy, well controlled diabetes mellitus, moderate coronary artery disease and bilateral carotid artery occlusion without stroke.  How ever she developed severe visual disturbance in left eye and had to discontinue Tadalafil (extensive evaluation by neuro ophthalmology) who felt along with diabetic retinopathy she has PDE 5 induced retinal dysfunction.    Fortunately she is remained stable without decompensated heart failure, also states that she has not had any further ascites or swelling of her leg.  She was evaluated by GI and advised to come back in a year recently.  She has developed diarrhea for the last 3 days hence could not make it to our office, this was a virtual visit and I would like to see him back in 3 months for close monitoring with a 6-minute walk test.  She will need an echocardiogram as well to reevaluate pulmonary pressures and RV systolic function.  With regard to CAD, fortunately she has not had any recurrence of angina pectoris.  EKG is also nonischemic.  I will see her back in 3 months.  Adrian Prows, MD, [facility] 11/21/2019, 1:48 PM [facility]. [place] Pager: [phone] Office: [phone] If no answer Cell [phone]

## 2019-12-07 DIAGNOSIS — R918 Other nonspecific abnormal finding of lung field: Secondary | ICD-10-CM | POA: Diagnosis not present

## 2019-12-07 DIAGNOSIS — I288 Other diseases of pulmonary vessels: Secondary | ICD-10-CM | POA: Diagnosis not present

## 2019-12-07 DIAGNOSIS — I517 Cardiomegaly: Secondary | ICD-10-CM | POA: Diagnosis not present

## 2019-12-11 DIAGNOSIS — E1149 Type 2 diabetes mellitus with other diabetic neurological complication: Secondary | ICD-10-CM | POA: Diagnosis not present

## 2019-12-11 DIAGNOSIS — M79675 Pain in left toe(s): Secondary | ICD-10-CM | POA: Diagnosis not present

## 2019-12-11 DIAGNOSIS — M79674 Pain in right toe(s): Secondary | ICD-10-CM | POA: Diagnosis not present

## 2019-12-11 DIAGNOSIS — B351 Tinea unguium: Secondary | ICD-10-CM | POA: Diagnosis not present

## 2019-12-18 ENCOUNTER — Telehealth: Payer: Self-pay | Admitting: Gastroenterology

## 2019-12-18 NOTE — Telephone Encounter (Signed)
RECALL FOR ULTRASOUND

## 2019-12-18 NOTE — Telephone Encounter (Signed)
Letter mailed.

## 2019-12-24 ENCOUNTER — Other Ambulatory Visit: Payer: Self-pay | Admitting: Gastroenterology

## 2019-12-31 ENCOUNTER — Other Ambulatory Visit: Payer: Self-pay

## 2019-12-31 ENCOUNTER — Ambulatory Visit: Payer: Medicare Other | Attending: Internal Medicine

## 2019-12-31 DIAGNOSIS — Z23 Encounter for immunization: Secondary | ICD-10-CM | POA: Insufficient documentation

## 2019-12-31 NOTE — Progress Notes (Signed)
   Covid-19 Vaccination Clinic  Name:  [name]    MRN: [id number] DOB: 1931-03-31  12/31/2019  [name] was observed post Covid-19 immunization for 15 minutes without incidence. She was provided with Vaccine Information Sheet and instruction to access the V-Safe system.   [name] was instructed to call 911 with any severe reactions post vaccine: Marland Kitchen Difficulty breathing  . Swelling of your face and throat  . A fast heartbeat  . A bad rash all over your body  . Dizziness and weakness    Immunizations Administered    Name Date Dose VIS Date Route   Moderna COVID-19 Vaccine 12/31/2019  3:16 PM 0.5 mL 10/17/2019 Intramuscular   Manufacturer: Moderna   Lot: [id number]   [place]: [id number]

## 2020-01-01 DIAGNOSIS — R7309 Other abnormal glucose: Secondary | ICD-10-CM | POA: Diagnosis not present

## 2020-01-01 DIAGNOSIS — E7800 Pure hypercholesterolemia, unspecified: Secondary | ICD-10-CM | POA: Diagnosis not present

## 2020-01-01 DIAGNOSIS — I10 Essential (primary) hypertension: Secondary | ICD-10-CM | POA: Diagnosis not present

## 2020-01-03 ENCOUNTER — Ambulatory Visit: Payer: Medicare Other

## 2020-01-03 ENCOUNTER — Other Ambulatory Visit: Payer: Self-pay

## 2020-01-03 DIAGNOSIS — I5032 Chronic diastolic (congestive) heart failure: Secondary | ICD-10-CM | POA: Diagnosis not present

## 2020-01-03 DIAGNOSIS — I270 Primary pulmonary hypertension: Secondary | ICD-10-CM

## 2020-01-08 DIAGNOSIS — E039 Hypothyroidism, unspecified: Secondary | ICD-10-CM | POA: Diagnosis not present

## 2020-01-08 DIAGNOSIS — Z9981 Dependence on supplemental oxygen: Secondary | ICD-10-CM | POA: Diagnosis not present

## 2020-01-08 DIAGNOSIS — I5030 Unspecified diastolic (congestive) heart failure: Secondary | ICD-10-CM | POA: Diagnosis not present

## 2020-01-08 DIAGNOSIS — E119 Type 2 diabetes mellitus without complications: Secondary | ICD-10-CM | POA: Diagnosis not present

## 2020-01-08 DIAGNOSIS — I272 Pulmonary hypertension, unspecified: Secondary | ICD-10-CM | POA: Diagnosis not present

## 2020-01-17 ENCOUNTER — Other Ambulatory Visit: Payer: Self-pay

## 2020-01-17 ENCOUNTER — Ambulatory Visit: Payer: Medicare Other | Attending: Internal Medicine

## 2020-01-17 DIAGNOSIS — Z20822 Contact with and (suspected) exposure to covid-19: Secondary | ICD-10-CM

## 2020-01-18 LAB — NOVEL CORONAVIRUS, NAA: SARS-CoV-2, NAA: NOT DETECTED

## 2020-01-25 ENCOUNTER — Telehealth: Payer: Self-pay | Admitting: Gastroenterology

## 2020-01-25 DIAGNOSIS — K7460 Unspecified cirrhosis of liver: Secondary | ICD-10-CM

## 2020-01-25 NOTE — Addendum Note (Signed)
Addended by: Hassan Rowan on: 01/25/2020 02:08 PM   Modules accepted: Orders

## 2020-01-25 NOTE — Telephone Encounter (Signed)
Patient received letter to schedule ultrasound,  Please call

## 2020-01-25 NOTE — Telephone Encounter (Signed)
[place] abd RUQ scheduled for 02/01/20 at 9:30am, arrive at 9:15am. NPO after midnight prior to test.   Tried to call daughter [name]), LMOVM to inform her of appt. Letter mailed.

## 2020-01-28 ENCOUNTER — Ambulatory Visit: Payer: Medicare Other | Attending: Internal Medicine

## 2020-01-28 DIAGNOSIS — Z23 Encounter for immunization: Secondary | ICD-10-CM

## 2020-01-28 NOTE — Progress Notes (Signed)
   Covid-19 Vaccination Clinic  Name:  [name]    MRN: [id number] DOB: December 16, 1930  01/28/2020  Ms. [name] was observed post Covid-19 immunization for 30 minutes based on pre-vaccination screening without incident. She was provided with Vaccine Information Sheet and instruction to access the V-Safe system.   Ms. [name] was instructed to call 911 with any severe reactions post vaccine: Marland Kitchen Difficulty breathing  . Swelling of face and throat  . A fast heartbeat  . A bad rash all over body  . Dizziness and weakness   Immunizations Administered    Name Date Dose VIS Date Route   Moderna COVID-19 Vaccine 01/28/2020  2:29 PM 0.5 mL 10/17/2019 Intramuscular   Manufacturer: Moderna   Lot: [id number]   [place]: [id number]

## 2020-01-30 ENCOUNTER — Other Ambulatory Visit: Payer: Self-pay | Admitting: *Deleted

## 2020-01-30 NOTE — Patient Outreach (Addendum)
[facility]) Care Management  [place]  01/30/2020   [name] 1931/03/15 [id number]  [place] telephone call to patient and daughter.  Hipaa compliance verified. Per patient daughter the patient is not feeling good today since she received her second vaccine on 01/28/2020. Per daughter she is having some hip pain, diarrhea and a headache. . Patient temp is normal.  Her appetite is good.Patient blood pressure ranges for the last week is 129/48 today. 146/67, 132/59, 135/52 and 144/59.  Patient range is looking better. Patient ambulates at home with a cane or walker. Per daughter the food preparation for her mother is always baked and broiled. No fried foods. Daughter prepares food and does not use any salt and reads the labels of food.Patient and daughter voiced receiving the information.   Encounter Medications:  Outpatient Encounter Medications as of 01/30/2020  Medication Sig  . acetaminophen (TYLENOL) 500 MG tablet Take 500 mg by mouth every 8 (eight) hours as needed for mild pain or moderate pain.  Marland Kitchen ALPRAZolam (XANAX) 0.25 MG tablet Take 0.25 mg by mouth 2 (two) times daily as needed for anxiety.  Marland Kitchen atorvastatin (LIPITOR) 10 MG tablet Take 1 tablet (10 mg total) by mouth daily.  . cholecalciferol (VITAMIN D) 1000 UNITS tablet Take 2,000 Units by mouth every morning.  . citalopram (CELEXA) 20 MG tablet Take 1 tablet by mouth daily.  . clopidogrel (PLAVIX) 75 MG tablet Take 75 mg by mouth daily.  . clotrimazole-betamethasone (LOTRISONE) cream Apply 1 application topically 2 (two) times daily as needed (Eczema).   . docusate sodium (COLACE) 100 MG capsule Take 100 mg by mouth 2 (two) times daily.  . feeding supplement, GLUCERNA SHAKE, (GLUCERNA SHAKE) LIQD Take 237 mLs by mouth daily as needed.   . furosemide (LASIX) 40 MG tablet Take 1.5 tablets (60 mg total) by mouth daily.  . Glycerin-Polysorbate 80 (REFRESH DRY EYE THERAPY OP) Place 1 drop into both  eyes 2 (two) times daily as needed.  . hydrALAZINE (APRESOLINE) 10 MG tablet Take 1 tablet by mouth 2 (two) times a day.  . insulin aspart (NOVOLOG FLEXPEN) 100 UNIT/ML FlexPen Inject 5 Units into the skin 3 (three) times daily with meals. When BG is over 100   . Insulin Glargine (LANTUS SOLOSTAR) 100 UNIT/ML Solostar Pen Inject 9 Units into the skin at bedtime.  Marland Kitchen loratadine (CLARITIN) 10 MG tablet Take 10 mg by mouth daily.  . macitentan (OPSUMIT) 10 MG tablet Take 1 tablet (10 mg total) by mouth daily.  . Methylfol-Algae-B12-Acetylcyst (CEREFOLIN NAC) 6-90.314-2-600 MG TABS Take 1 tablet by mouth daily.   . metoprolol (LOPRESSOR) 50 MG tablet Take 32m in the a.m.and 266min the pm (Patient taking differently: Take 12.5 mg by mouth 2 (two) times daily. )  . mirtazapine (REMERON) 30 MG tablet Take 30 mg by mouth at bedtime.  . Multiple Vitamin (MULTIVITAMIN) tablet Take 1 tablet by mouth daily. Reported on 02/06/2016  . olmesartan (BENICAR) 5 MG tablet Take 1 tablet (5 mg total) by mouth daily.  . pantoprazole (PROTONIX) 40 MG tablet TAKE 1 TABLET TWICE A DAY BEFORE MEALS  . Polyethyl Glycol-Propyl Glycol (SYSTANE OP) Apply to eye as needed.  . potassium chloride (KLOR-CON) 10 MEQ tablet TAKE 1 TABLET   DAILY AS DIRECTED WITH FUROSEMIDE  . psyllium (METAMUCIL SMOOTH TEXTURE) 28 % packet Take 1 packet by mouth as needed.   . thyroid (ARMOUR) 15 MG tablet Take 15 mg by mouth daily.  .Marland Kitchen  umeclidinium bromide (INCRUSE ELLIPTA) 62.5 MCG/INH AEPB Inhale 1 puff into the lungs daily.  . ursodiol (ACTIGALL) 500 MG tablet TAKE 1 TABLET TWICE A DAY WITH MEALS   No facility-administered encounter medications on file as of 01/30/2020.    Functional Status:  In your present state of health, do you have any difficulty performing the following activities: 11/02/2019  Hearing? N  Vision? N  Difficulty concentrating or making decisions? N  Walking or climbing stairs? Y  Comment uses a cane if long walks or  walker  Dressing or bathing? N  Doing errands, shopping? Y  Comment Family goes with patient on errands  Preparing Food and eating ? N  Using the Toilet? N  In the past six months, have you accidently leaked urine? N  Do you have problems with loss of bowel control? N  Managing your Medications? N  Housekeeping or managing your Housekeeping? Y  Comment Daughter assists  Some recent data might be hidden    Fall/Depression Screening: Fall Risk  01/30/2020 11/02/2019 07/14/2016  Falls in the past year? 0 0 No  Comment - - [place] Telephone Survey: data to providers prior to load  Number falls in past yr: 0 0 -  Injury with Fall? 0 0 -  Risk for fall due to : Impaired mobility;Impaired balance/gait Impaired mobility;Impaired balance/gait -  Risk for fall due to: Comment - uses a cane or walker sometimes outside the house -  Follow up Falls evaluation completed;Falls prevention discussed Falls evaluation completed -   PHQ 2/9 Scores 01/30/2020 11/02/2019  PHQ - 2 Score 0 0   THN CM Care Plan Problem One     Most Recent Value  Care Plan Problem One  Knowledge Deficit in Self Management of Hypertension  Role Documenting the Problem One  [place]ater for Problem One  Active  THN Long Term Goal   Patient and daughter will be able to verbalize having a better understanding of hypertension within the next 90 days  THN Long Term Goal Start Date  01/30/20  Interventions for Problem One Long Term Goal  RN discussed patient blood pressure readings. RN will follow up with further monitoring and discussion  THN CM Short Term Goal #1   Patient and daughter will verbalize receiving Advance directive packet within the next 30 days  THN CM Short Term Goal #1 Met Date  01/30/20  [facility] CM Short Term Goal #2   Patient and daughter will verbalize having a better understandiong of low and high sodium foods within the next 30 days  THN CM Short Term Goal #2 Met Date  01/30/20  [facility] CM Short Term Goal  #3  Patient will monitor blood pressure and document in calendar book within the next 30 days  THN CM Short Term Goal #3 Met Date  01/30/20       Assessment:  Patient has received second COVID vaccine Patient BP range is 146/67-129/48 Patient received Advance Directive Packet Patient and family documents wt, O2 SATs, BP and temps daily Patient will continue to benefit from [employer] telephonic outreach for education and support for hypertension self management.  Plan: RN discussed food preparation RN discussed COVID vaccine RN discussed health maintenance RN discussed places to purchase masks, gloves and N95 RN will follow up within the month of June  Sherah Lund [place] [facility] [phone]

## 2020-02-01 ENCOUNTER — Ambulatory Visit (HOSPITAL_COMMUNITY): Payer: Medicare Other

## 2020-02-08 ENCOUNTER — Encounter: Payer: Self-pay | Admitting: Cardiology

## 2020-02-08 ENCOUNTER — Ambulatory Visit: Payer: Medicare Other | Admitting: Cardiology

## 2020-02-08 ENCOUNTER — Other Ambulatory Visit: Payer: Self-pay

## 2020-02-08 VITALS — BP 155/78 | HR 62 | Temp 97.4°F | Resp 17 | Ht 64.0 in | Wt 150.0 lb

## 2020-02-08 DIAGNOSIS — K743 Primary biliary cirrhosis: Secondary | ICD-10-CM

## 2020-02-08 DIAGNOSIS — I270 Primary pulmonary hypertension: Secondary | ICD-10-CM

## 2020-02-08 DIAGNOSIS — I10 Essential (primary) hypertension: Secondary | ICD-10-CM

## 2020-02-08 DIAGNOSIS — I5032 Chronic diastolic (congestive) heart failure: Secondary | ICD-10-CM

## 2020-02-08 MED ORDER — HYDRALAZINE HCL 25 MG PO TABS: 25.0000 mg | ORAL_TABLET | Freq: Three times a day (TID) | ORAL | 3 refills | Status: DC

## 2020-02-08 NOTE — Progress Notes (Signed)
Primary Physician/Referring:  [name], MD  Patient ID: [name], female    DOB: Jun 27, 1931, 86 y.o.   MRN: [id number]  Chief Complaint  Patient presents with  . Hypertension  . Congestive Heart Failure  . Follow-up    3 month   HPI: [name]  is a 86 y.o. female  with primary pulmonary hypertension, cryptogenic cirrhosis of the liver, presently on Opsumit since July 2018, did not tolerate Adcirca or Sildanafil due to visual side effects. She was evaluated by Salome Holmes by Dr. Suzi Roots in November 2018 who recommended continuing present medical therapy and to consider addition of Selexipag if disease progresses.  She has history of breast cancer status post radical mastectomy and chemotherapy, well controlled diabetes mellitus, moderate coronary artery disease and bilateral carotid artery occlusion without stroke and is on chronic plavix.   She is presently doing well and states that her dyspnea is stable.  She has not used extra doses of diuretics, ascites has completely resolved and liver function has stabilized. Daughter present.   Past Medical History:  Diagnosis Date  . Acute delirium 06/23/2013  . Acute diastolic congestive heart failure ([place]) 03/21/2014  . Anxiety   . Cancer ([place])   . Carotid artery disease (HCC)    Bilateral ICA occlusion  . Chronic headaches   . Chronic liver disease    ?cirrhosis on 10/2014 CT. H/O elevated alkphos and +AMA on URSO.  Marland Kitchen Coronary atherosclerosis of native coronary artery    Mild nonobstructive disease at cardiac catheterization May 2011  . Depression   . Essential hypertension, benign   . Fracture of femoral neck, right, closed ([place]) 03/28/2014  . History of breast cancer   . History of GI bleed   . Hyperlipidemia   . Orthostatic hypotension    Diabetic polyneuropathy/diabetic dysautonomia   . Osteopenia   . Parotid tumor   . Pneumonia 11/2016  . Pneumonia   . Pressure ulcer, heel, right, unstageable ([place]) 04/17/2014    . PSVT (paroxysmal supraventricular tachycardia) ([place])   . Syncope and collapse 04/15/2010   Qualifier: Diagnosis of  By: Angelena Form, MD, Harrell Gave    . Type 2 diabetes mellitus ([place])   . UPPER GASTROINTESTINAL HEMORRHAGE 03/21/2007   Qualifier: Diagnosis of  By: Jonna Munro MD, Roderic Scarce      Past Surgical History:  Procedure Laterality Date  . ABDOMINAL HYSTERECTOMY    . APPENDECTOMY    . CATARACT EXTRACTION Bilateral    Dr. Gershon Crane  . CHOLECYSTECTOMY  2008  . COLONOSCOPY  2010   Dr. Oneida Alar: single tubular adenoma, one hyperplastic polyp. Next TCS 2020 health permitting.  . ESOPHAGOGASTRODUODENOSCOPY N/A 08/17/2016   Procedure: ESOPHAGOGASTRODUODENOSCOPY (EGD);  Surgeon: Danie Binder, MD;  [facility];  Service: Endoscopy;  Laterality: N/A;  8:45 am  . ESOPHAGOGASTRODUODENOSCOPY N/A 01/01/2017   Procedure: ESOPHAGOGASTRODUODENOSCOPY (EGD);  Surgeon: Danie Binder, MD;  Location: [facility];  Service: Endoscopy;  Laterality: N/A;  9:15 am  . HEMORRHOID SURGERY  1960s  . HIP ARTHROPLASTY Right 03/28/2014   Procedure: ARTHROPLASTY BIPOLAR HIP;  Surgeon: Carole Civil, MD;  Location: [facility] ORS;  Service: Orthopedics;  Laterality: Right;  . LAPAROSCOPIC APPENDECTOMY N/A 04/13/2013   Procedure: APPENDECTOMY LAPAROSCOPIC;  Surgeon: Donato Heinz, MD;  Location: [facility] ORS;  Service: General;  Laterality: N/A;  . MASTECTOMY  1994   Left breast -related to breast cancer  . RIGHT HEART CATH N/A 04/29/2017   Procedure: Right Heart Cath;  Surgeon: Adrian Prows, MD;  Location: [facility];  Service: Cardiovascular;  Laterality: N/A;  . RIGHT/LEFT HEART CATH AND CORONARY ANGIOGRAPHY N/A 04/13/2017   Procedure: RIGHT/LEFT HEART CATH AND CORONARY ANGIOGRAPHY;  Surgeon: Adrian Prows, MD;  Location: [place] [facility];  Service: Cardiovascular;  Laterality: N/A;  . SAVORY DILATION N/A 08/17/2016   Procedure: SAVORY DILATION;  Surgeon: Danie Binder, MD;  Location[facility];  Service:  Endoscopy;  Laterality: N/A;  . VESICOVAGINAL FISTULA CLOSURE W/ TAH  1979  . YAG LASER APPLICATION Left    Dr. Zadie Rhine   Social History   Tobacco Use  . Smoking status: Former Smoker    Packs/day: 0.50    Years: 40.00    Pack years: 20.00    Types: Cigarettes    Quit date: 03/30/2013    Years since quitting: 6.8  . Smokeless tobacco: Never Used  . Tobacco comment: Quit since 03/2013  Substance Use Topics  . Alcohol use: No    Alcohol/week: 0.0 standard drinks   Review of Systems  Eyes: Positive for blurred vision.  Cardiovascular: Positive for dyspnea on exertion. Negative for chest pain and leg swelling.  Musculoskeletal: Positive for arthritis and joint pain.  Gastrointestinal: Negative for melena.   Objective  Blood pressure (!) 155/78, pulse 62, temperature (!) 97.4 F (36.3 C), temperature source Temporal, resp. rate 17, height _0  (1.626 m), weight 150 lb (68 kg), SpO2 95 %. Body mass index is 25.75 kg/m. Vitals with BMI 02/08/2020 11/21/2019 10/05/2019  Height _1  _2  _3   Weight 150 lbs 148 lbs 152 lbs 13 oz  BMI 25.73 20.35 59.74  Systolic 163 845 364  Diastolic 78 48 70  Pulse 62 65 75   Physical Exam  Constitutional: She appears well-developed and well-nourished. No distress.  Cardiovascular: Normal rate, regular rhythm and intact distal pulses. Exam reveals no gallop.  Murmur heard. High-pitched blowing holosystolic murmur is present with a grade of 2/6 at the lower left sternal border. Normal S2, loud S2.  No leg edema. No JVD  Pulmonary/Chest: Effort normal and breath sounds normal.  Abdominal: Soft. Bowel sounds are normal.  Musculoskeletal:     Cervical back: Neck supple.   Radiology: No results found.  Laboratory examination:   CMP Latest Ref Rng & Units 05/08/2019 12/26/2018 10/26/2018  Glucose 65 - 139 mg/dL - 167(H) -  BUN 7 - 25 mg/dL - 17 -  Creatinine 0.44 - 1.00 mg/dL 0.80 0.81 0.70  Sodium 135 - 146 mmol/L - 135 -  Potassium 3.5  - 5.3 mmol/L - 4.2 -  Chloride 98 - 110 mmol/L - 96(L) -  CO2 20 - 32 mmol/L - 33(H) -  Calcium 8.6 - 10.4 mg/dL - 9.3 -  Total Protein 6.1 - 8.1 g/dL - 7.4 -  Total Bilirubin 0.2 - 1.2 mg/dL - 0.5 -  Alkaline Phos 38 - 126 U/L - - -  AST 10 - 35 U/L - 19 -  ALT 6 - 29 U/L - 10 -   CBC Latest Ref Rng & Units 12/26/2018 07/20/2018 07/18/2018  WBC 3.8 - 10.8 Thousand/uL 4.1 4.0 3.9(L)  Hemoglobin 11.7 - 15.5 g/dL 12.1 11.2(L) 11.8(L)  Hematocrit 35.0 - 45.0 % 35.7 34.0(L) 34.8(L)  Platelets 140 - 400 Thousand/uL 219 196 222   External labs: Hemoglobin 10.400 02/03/2020; Platelets 188.000 02/03/2020  Creatinine, Serum 1.030 02/03/2020 Potassium 3.900 02/03/2020 ALT (SGPT) 26.000 02/02/2020  TSH 1.489 01/09/2020  Labs 06/23/2013:  ANA positive. Hepatitis B and C negative. Negative for anti-smooth muscle antibody, mitochondrial antibody.  PRN Meds:. Medications Discontinued During This Encounter  Medication Reason  . hydrALAZINE (APRESOLINE) 10 MG tablet Reorder  . Methylfol-Algae-B12-Acetylcyst (CEREFOLIN NAC) 6-90.314-2-600 MG TABS Patient has not taken in last 30 days   Current Meds  Medication Sig  . acetaminophen (TYLENOL) 500 MG tablet Take 500 mg by mouth every 8 (eight) hours as needed for mild pain or moderate pain.  Marland Kitchen ALPRAZolam (XANAX) 0.25 MG tablet Take 0.25 mg by mouth 2 (two) times daily as needed for anxiety.  Marland Kitchen atorvastatin (LIPITOR) 10 MG tablet Take 1 tablet (10 mg total) by mouth daily.  . cholecalciferol (VITAMIN D) 1000 UNITS tablet Take 2,000 Units by mouth every morning.  . citalopram (CELEXA) 20 MG tablet Take 1 tablet by mouth daily.  . clotrimazole-betamethasone (LOTRISONE) cream Apply 1 application topically 2 (two) times daily as needed (Eczema).   . docusate sodium (COLACE) 100 MG capsule Take 100 mg by mouth 2 (two) times daily.  . feeding supplement, GLUCERNA SHAKE, (GLUCERNA SHAKE) LIQD Take 237 mLs by mouth daily as needed.   . furosemide (LASIX) 40 MG  tablet Take 1.5 tablets (60 mg total) by mouth daily.  . Glycerin-Polysorbate 80 (REFRESH DRY EYE THERAPY OP) Place 1 drop into both eyes 2 (two) times daily as needed.  . hydrALAZINE (APRESOLINE) 25 MG tablet Take 1 tablet (25 mg total) by mouth 3 (three) times daily.  . insulin aspart (NOVOLOG FLEXPEN) 100 UNIT/ML FlexPen Inject 5 Units into the skin 3 (three) times daily with meals. When BG is over 100   . Insulin Glargine (LANTUS SOLOSTAR) 100 UNIT/ML Solostar Pen Inject 9 Units into the skin at bedtime.  Marland Kitchen loratadine (CLARITIN) 10 MG tablet Take 10 mg by mouth daily.  . macitentan (OPSUMIT) 10 MG tablet Take 1 tablet (10 mg total) by mouth daily.  . metoprolol (LOPRESSOR) 50 MG tablet Take 64m in the a.m.and 259min the pm (Patient taking differently: Take 12.5 mg by mouth 2 (two) times daily. )  . mirtazapine (REMERON) 30 MG tablet Take 30 mg by mouth at bedtime.  . Multiple Vitamin (MULTIVITAMIN) tablet Take 1 tablet by mouth daily. Reported on 02/06/2016  . olmesartan (BENICAR) 5 MG tablet Take 1 tablet (5 mg total) by mouth daily.  . pantoprazole (PROTONIX) 40 MG tablet TAKE 1 TABLET TWICE A DAY BEFORE MEALS  . Polyethyl Glycol-Propyl Glycol (SYSTANE OP) Apply to eye as needed.  . potassium chloride (KLOR-CON) 10 MEQ tablet TAKE 1 TABLET   DAILY AS DIRECTED WITH FUROSEMIDE  . psyllium (METAMUCIL SMOOTH TEXTURE) 28 % packet Take 1 packet by mouth as needed.   . thyroid (ARMOUR) 15 MG tablet Take 15 mg by mouth daily.  . Marland Kitchenmeclidinium bromide (INCRUSE ELLIPTA) 62.5 MCG/INH AEPB Inhale 1 puff into the lungs daily.  . ursodiol (ACTIGALL) 500 MG tablet TAKE 1 TABLET TWICE A DAY WITH MEALS  . [DISCONTINUED] hydrALAZINE (APRESOLINE) 10 MG tablet Take 1 tablet by mouth 2 (two) times a day.   Cardiac Studies:   Carotid artery duplex 0405/09/18Bilateral internal carotid artery flow appears to be sluggish and may suggest complete occlusion. Left vertebral artery flow is undetectable (no flow).  Antegrade right vertebral artery flow. Consider different modality of imaging to better define anatomy.  Right Heart Cath 04/29/2017, RA pressure 8/2, mean 2 mmHg. RV 78/1, EDP 3 mmHg. PA 80/18, mean 42 mmHg. PW 13/14, mean 11 mmHg. PA saturation  was 73%, aortic saturation 98%. Cardiac output 4.88, cardiac index 2.85 by Fick.  Echocardiogram 01/03/2020:  1. Normal LV systolic function with visual EF 60-65%. Left ventricle  cavity is normal in size. Normal left ventricular wall thickness. Normal  global wall motion. Doppler evidence of grade II (pseudonormal) diastolic  dysfunction, elevated LAP. Calculated EF 56%.  2. Left atrial cavity is moderate to severely dilated.  3. Right atrial cavity is moderately dilated.  4. Mild (Grade I) mitral regurgitation.  5. Normal RV size and function.  Moderate tricuspid regurgitation. Moderate pulmonary hypertension. RVSP  measures 54 mmHg.  6. Moderate pulmonic regurgitation.  7. IVC is normal with blunted respiratory response.  8. Prior study dated Echocardiogram 10/21/2018, no significant change.   EKG: EKG 02/08/2020: Sinus rhythm with first-degree AV block at a rate of 66 bpm, right atrial enlargement, right axis deviation.  Incomplete right bundle branch block.  No evidence of ischemia.    SIX MIN WALK 04/25/2019 04/25/2019  Supplimental Oxygen during Test? (L/min) Yes -  Type Continuous -  Laps 6 6  Partial Lap (in Meters) 0 -  Baseline Heartrate 69 -  Baseline SPO2 94 -  Heartrate 102 -  SPO2 91 -  Heartrate 83 -  SPO2 92 -  Stopped or Paused before Six Minutes No -  Distance Completed 204 -  Tech Comments: pt walked 6 laps in 6 minutes -  Total distance 120 meters.   Assessment     ICD-10-CM   1. Primary pulmonary hypertension (HCC)  I27.0 EKG 12-Lead  2. Diastolic CHF, chronic (HCC)  I50.32   3. Essential hypertension  I10 hydrALAZINE (APRESOLINE) 25 MG tablet  4. Primary biliary cirrhosis (HCC) Chronic K74.3     Recommendations:     [name]  is a 86 y.o. female  with primary pulmonary hypertension, cryptogenic cirrhosis of the liver, presently on Opsumit since July 2018, did not tolerate Adcirca or Sildanafil due to visual side effects. She was evaluated by Salome Holmes by Dr. Suzi Roots who recommended continuing present medical therapy and to consider addition of Selexipag if disease progresses, has an appointment to see her soon.  She has history of breast cancer status post radical mastectomy and chemotherapy, well controlled diabetes mellitus, moderate coronary artery disease and bilateral carotid artery occlusion without stroke and is on chronic plavix. SHe is now 86 years of age and has done well.  Fortunately she is remained stable without decompensated heart failure, also states that she has not had any further ascites or swelling of her leg.  Echocardiogram reviewed, RV function is normal and no RV dilatation, EKG corpulmonale findings, no clinical signs of CHF. BP is elevated, I will increase hydralazine from 10 mg twice daily to 25 mg 3 times daily.  I would like to see him back in 3 months for close monitoring with a 6-minute walk test.   She will be placed in [facility] in view of risk for CHF and management of hypertension and high risk medications.   Her external labs reviewed, renal function has remained stable although her hemoglobin is fallen down.  This needs to be closely monitored and if no labs are available by next office visit, I will obtain iron studies as she is on chronic Plavix and may need to consider discontinuing this or switching to aspirin alone.  Adrian Prows, MD, [facility] 02/08/2020, 9:51 PM [facility]. [place] Office: [phone]

## 2020-02-13 DIAGNOSIS — I10 Essential (primary) hypertension: Secondary | ICD-10-CM | POA: Diagnosis not present

## 2020-02-14 DIAGNOSIS — I10 Essential (primary) hypertension: Secondary | ICD-10-CM | POA: Diagnosis not present

## 2020-02-15 ENCOUNTER — Other Ambulatory Visit: Payer: Self-pay

## 2020-02-15 ENCOUNTER — Ambulatory Visit (HOSPITAL_COMMUNITY)
Admission: RE | Admit: 2020-02-15 | Discharge: 2020-02-15 | Disposition: A | Discharge status: Home / Self Care | Payer: Medicare Other | Source: Ambulatory Visit | Attending: Gastroenterology | Admitting: Gastroenterology

## 2020-02-15 DIAGNOSIS — K7460 Unspecified cirrhosis of liver: Secondary | ICD-10-CM | POA: Insufficient documentation

## 2020-02-26 DIAGNOSIS — B351 Tinea unguium: Secondary | ICD-10-CM | POA: Diagnosis not present

## 2020-02-26 DIAGNOSIS — M79674 Pain in right toe(s): Secondary | ICD-10-CM | POA: Diagnosis not present

## 2020-02-26 DIAGNOSIS — E1151 Type 2 diabetes mellitus with diabetic peripheral angiopathy without gangrene: Secondary | ICD-10-CM | POA: Diagnosis not present

## 2020-02-26 DIAGNOSIS — M79675 Pain in left toe(s): Secondary | ICD-10-CM | POA: Diagnosis not present

## 2020-02-28 DIAGNOSIS — Z96641 Presence of right artificial hip joint: Secondary | ICD-10-CM | POA: Diagnosis not present

## 2020-02-28 DIAGNOSIS — M25551 Pain in right hip: Secondary | ICD-10-CM | POA: Diagnosis not present

## 2020-02-28 DIAGNOSIS — R109 Unspecified abdominal pain: Secondary | ICD-10-CM | POA: Diagnosis not present

## 2020-02-28 DIAGNOSIS — R195 Other fecal abnormalities: Secondary | ICD-10-CM | POA: Diagnosis not present

## 2020-02-28 DIAGNOSIS — Z471 Aftercare following joint replacement surgery: Secondary | ICD-10-CM | POA: Diagnosis not present

## 2020-03-14 ENCOUNTER — Telehealth: Payer: Self-pay | Admitting: Pharmacist

## 2020-03-14 DIAGNOSIS — I10 Essential (primary) hypertension: Secondary | ICD-10-CM

## 2020-03-14 MED ORDER — HYDRALAZINE HCL 10 MG PO TABS: 10.0000 mg | ORAL_TABLET | Freq: Two times a day (BID) | ORAL | 2 refills | Status: DC

## 2020-03-14 NOTE — Telephone Encounter (Signed)
Called for [facility] follow up. Talked to daughter [name] and pt. BP readings at home have been controlled and at goal. Pt reports to be feeling well overall. Denies any recent hospitalization, falls, or further complications. Pt reports to be tolerating her medications without any ADRs. Med list reviewed and updated. Pt and daughter interested in reducing her pill burden. Will try holding mirtazapine, pantoprazole, and Claritin and keeping it as an as needed basis rather than daily use. Noted DDI b/w mirtazapine and celexa of increased risk of serotonin syndrome. Pt denies any complains of HA, CP, dizziness, lightheadedness, edema. Pt recovering from recent bout of diarrhea following her second COVID vaccine. Pt reports to be feeling much improved now. No other issues or complains of note. Daughter requested Hydralazine refills to be sent to the mail order for the updated dose. Rx sent per pt request. Will continue to monitor and follow up as needed.

## 2020-03-15 DIAGNOSIS — I10 Essential (primary) hypertension: Secondary | ICD-10-CM | POA: Diagnosis not present

## 2020-03-21 ENCOUNTER — Telehealth: Payer: Self-pay

## 2020-03-21 NOTE — Telephone Encounter (Signed)
Pt's daughter called. Pt wasn't given results for her u/s 02/15/20. Last ov 11.2020 states that pt will come back in one year and have a repeat u/s in 1 year.  Pt has seen [facility] the last few years and [facility] three years ago.

## 2020-03-22 NOTE — Telephone Encounter (Signed)
U/S results are in the imaging tab. Can expound and tell her it showed known cirrhosis and no suspicious lesions. With cirrhosis we recommend follow-up every 6 months for updated labs and imaging, as an FYI.

## 2020-03-25 NOTE — Telephone Encounter (Signed)
Lmom for pts daughter. Waiting on a return call.

## 2020-03-25 NOTE — Telephone Encounter (Signed)
Spoke with pts daughter. She was given results. Pt was scheduled a 6 month f/u with AB.   Manuela Schwartz can you request labs from pts PCP DR. [name]

## 2020-03-26 NOTE — Telephone Encounter (Signed)
Called and pcp will fax records

## 2020-03-29 ENCOUNTER — Other Ambulatory Visit: Payer: Self-pay | Admitting: Cardiology

## 2020-04-11 DIAGNOSIS — I10 Essential (primary) hypertension: Secondary | ICD-10-CM | POA: Diagnosis not present

## 2020-04-12 DIAGNOSIS — I10 Essential (primary) hypertension: Secondary | ICD-10-CM | POA: Diagnosis not present

## 2020-05-01 ENCOUNTER — Other Ambulatory Visit: Payer: Self-pay | Admitting: *Deleted

## 2020-05-01 NOTE — Patient Outreach (Signed)
[name] [facility]) Care Management  05/01/2020  [name] 1931/04/14 [id number]   RN Health Coach attempted follow up outreach call to patient.  Patient was unavailable. HIPPA compliance voicemail message left with return callback number.  Plan: RN will call patient again within 30 days.  [place] [facility] [phone]

## 2020-05-15 DIAGNOSIS — I10 Essential (primary) hypertension: Secondary | ICD-10-CM | POA: Diagnosis not present

## 2020-05-24 ENCOUNTER — Ambulatory Visit: Payer: Medicare Other | Admitting: Cardiology

## 2020-05-24 ENCOUNTER — Other Ambulatory Visit: Payer: Self-pay

## 2020-05-24 ENCOUNTER — Encounter: Payer: Self-pay | Admitting: Cardiology

## 2020-05-24 VITALS — BP 134/50 | HR 62 | Resp 16 | Ht 64.0 in | Wt 148.0 lb

## 2020-05-24 DIAGNOSIS — I10 Essential (primary) hypertension: Secondary | ICD-10-CM

## 2020-05-24 DIAGNOSIS — I270 Primary pulmonary hypertension: Secondary | ICD-10-CM

## 2020-05-24 DIAGNOSIS — K7469 Other cirrhosis of liver: Secondary | ICD-10-CM

## 2020-05-24 DIAGNOSIS — I5032 Chronic diastolic (congestive) heart failure: Secondary | ICD-10-CM

## 2020-05-24 NOTE — Progress Notes (Signed)
Primary Physician/Referring:  [name], MD  Patient ID: [name], female    DOB: 1931-01-24, 86 y.o.   MRN: [id number]  Chief Complaint  Patient presents with  . Follow-up    3 month  . Pulmonary Hypertension   HPI: [name]  is a 86 y.o. female  with primary pulmonary hypertension, cryptogenic cirrhosis of the liver, presently on Opsumit since July 2018, did not tolerate Adcirca or Sildanafil due to visual side effects. She was evaluated by Salome Holmes by Dr. Suzi Roots in November 2018 who recommended continuing present medical therapy and to consider addition of Selexipag if disease progresses.  She has history of breast cancer status post radical mastectomy and chemotherapy, well controlled diabetes mellitus, moderate coronary artery disease and bilateral carotid artery occlusion without stroke and is on chronic plavix.   She is presently doing well and states that her dyspnea is stable.  She has not used extra doses of diuretics, ascites has completely resolved and liver function has stabilized. Daughter present.  Complains of severe right hip pain since she received COVID-19 vaccine.  Otherwise no new symptomatology.  Past Medical History:  Diagnosis Date  . Acute delirium 06/23/2013  . Acute diastolic congestive heart failure ([place]) 03/21/2014  . Anxiety   . Cancer ([place])   . Carotid artery disease (HCC)    Bilateral ICA occlusion  . Chronic headaches   . Chronic liver disease    ?cirrhosis on 10/2014 CT. H/O elevated alkphos and +AMA on URSO.  Marland Kitchen Coronary atherosclerosis of native coronary artery    Mild nonobstructive disease at cardiac catheterization May 2011  . Depression   . Essential hypertension, benign   . Fracture of femoral neck, right, closed ([place]) 03/28/2014  . History of breast cancer   . History of GI bleed   . Hyperlipidemia   . Orthostatic hypotension    Diabetic polyneuropathy/diabetic dysautonomia   . Osteopenia   . Parotid tumor   .  Pneumonia 11/2016  . Pneumonia   . Pressure ulcer, heel, right, unstageable ([place]) 04/17/2014  . PSVT (paroxysmal supraventricular tachycardia) ([place])   . Syncope and collapse 04/15/2010   Qualifier: Diagnosis of  By: Angelena Form, MD, Harrell Gave    . Type 2 diabetes mellitus ([place])   . UPPER GASTROINTESTINAL HEMORRHAGE 03/21/2007   Qualifier: Diagnosis of  By: Jonna Munro MD, Roderic Scarce      Past Surgical History:  Procedure Laterality Date  . ABDOMINAL HYSTERECTOMY    . APPENDECTOMY    . CATARACT EXTRACTION Bilateral    Dr. Gershon Crane  . CHOLECYSTECTOMY  2008  . COLONOSCOPY  2010   Dr. Oneida Alar: single tubular adenoma, one hyperplastic polyp. Next TCS 2020 health permitting.  . ESOPHAGOGASTRODUODENOSCOPY N/A 08/17/2016   Procedure: ESOPHAGOGASTRODUODENOSCOPY (EGD);  Surgeon: Danie Binder, MD;  [facility];  Service: Endoscopy;  Laterality: N/A;  8:45 am  . ESOPHAGOGASTRODUODENOSCOPY N/A 01/01/2017   Procedure: ESOPHAGOGASTRODUODENOSCOPY (EGD);  Surgeon: Danie Binder, MD;  Location: [facility];  Service: Endoscopy;  Laterality: N/A;  9:15 am  . HEMORRHOID SURGERY  1960s  . HIP ARTHROPLASTY Right 03/28/2014   Procedure: ARTHROPLASTY BIPOLAR HIP;  Surgeon: Carole Civil, MD;  Location: [facility];  Service: Orthopedics;  Laterality: Right;  . LAPAROSCOPIC APPENDECTOMY N/A 04/13/2013   Procedure: APPENDECTOMY LAPAROSCOPIC;  Surgeon: Donato Heinz, MD;  Location: [facility];  Service: General;  Laterality: N/A;  . MASTECTOMY  1994   Left breast -related to breast cancer  .  RIGHT HEART CATH N/A 04/29/2017   Procedure: Right Heart Cath;  Surgeon: Adrian Prows, MD;  Location: [place];  Service: Cardiovascular;  Laterality: N/A;  . RIGHT/LEFT HEART CATH AND CORONARY ANGIOGRAPHY N/A 04/13/2017   Procedure: RIGHT/LEFT HEART CATH AND CORONARY ANGIOGRAPHY;  Surgeon: Adrian Prows, MD;  Location: [place];  Service: Cardiovascular;  Laterality: N/A;  . SAVORY DILATION N/A 08/17/2016    Procedure: SAVORY DILATION;  Surgeon: Danie Binder, MD;  [facility];  Service: Endoscopy;  Laterality: N/A;  . VESICOVAGINAL FISTULA CLOSURE W/ TAH  1979  . YAG LASER APPLICATION Left    Dr. Zadie Rhine   Social History   Tobacco Use  . Smoking status: Former Smoker    Packs/day: 0.50    Years: 40.00    Pack years: 20.00    Types: Cigarettes    Quit date: 03/30/2013    Years since quitting: 7.1  . Smokeless tobacco: Never Used  . Tobacco comment: Quit since 03/2013  Substance Use Topics  . Alcohol use: No    Alcohol/week: 0.0 standard drinks   Review of Systems  Eyes: Positive for blurred vision.  Cardiovascular: Positive for dyspnea on exertion. Negative for chest pain and leg swelling.  Musculoskeletal: Positive for arthritis and joint pain.  Gastrointestinal: Negative for melena.   Objective  Blood pressure (!) 134/50, pulse 62, resp. rate 16, height _0  (1.626 m), weight 148 lb (67.1 kg), SpO2 97 %. Body mass index is 25.4 kg/m. Vitals with BMI 05/24/2020 05/24/2020 02/08/2020  Height - _1  _2   Weight - 148 lbs 150 lbs  BMI - 16.10 96.04  Systolic 540 981 191  Diastolic 50 58 78  Pulse 62 64 62   Physical Exam Constitutional:      General: She is not in acute distress.    Appearance: She is well-developed.  Cardiovascular:     Rate and Rhythm: Normal rate and regular rhythm.     Pulses: Intact distal pulses.     Heart sounds: Murmur heard. High-pitched blowing holosystolic murmur is present with a grade of 2/6 at the lower left sternal border.  No gallop.      Comments: Normal S2, loud S2.  No leg edema. No JVD Pulmonary:     Effort: Pulmonary effort is normal.     Breath sounds: Examination of the right-middle field reveals rhonchi and rales. Examination of the right-lower field reveals rhonchi and rales. Rhonchi and rales present.  Abdominal:     General: Bowel sounds are normal.     Palpations: Abdomen is soft.  Musculoskeletal:     Cervical  back: Neck supple.    Radiology: No results found.  Laboratory examination:   CMP Latest Ref Rng & Units 05/08/2019 12/26/2018 10/26/2018  Glucose 65 - 139 mg/dL - 167(H) -  BUN 7 - 25 mg/dL - 17 -  Creatinine 0.44 - 1.00 mg/dL 0.80 0.81 0.70  Sodium 135 - 146 mmol/L - 135 -  Potassium 3.5 - 5.3 mmol/L - 4.2 -  Chloride 98 - 110 mmol/L - 96(L) -  CO2 20 - 32 mmol/L - 33(H) -  Calcium 8.6 - 10.4 mg/dL - 9.3 -  Total Protein 6.1 - 8.1 g/dL - 7.4 -  Total Bilirubin 0.2 - 1.2 mg/dL - 0.5 -  Alkaline Phos 38 - 126 U/L - - -  AST 10 - 35 U/L - 19 -  ALT 6 - 29 U/L - 10 -  CBC Latest Ref Rng & Units 12/26/2018 07/20/2018 07/18/2018  WBC 3.8 - 10.8 Thousand/uL 4.1 4.0 3.9(L)  Hemoglobin 11.7 - 15.5 g/dL 12.1 11.2(L) 11.8(L)  Hematocrit 35 - 45 % 35.7 34.0(L) 34.8(L)  Platelets 140 - 400 Thousand/uL 219 196 222   External labs: Hemoglobin 10.400 02/03/2020; Platelets 188.000 02/03/2020  Creatinine, Serum 1.030 02/03/2020 Potassium 3.900 02/03/2020 ALT (SGPT) 26.000 02/02/2020  TSH 1.489 01/09/2020  Labs 06/23/2013: ANA positive. Hepatitis B and C negative. Negative for anti-smooth muscle antibody, mitochondrial antibody.  PRN Meds:. There are no discontinued medications. Current Meds  Medication Sig  . acetaminophen (TYLENOL) 500 MG tablet Take 500 mg by mouth every 8 (eight) hours as needed for mild pain or moderate pain.  Marland Kitchen ALPRAZolam (XANAX) 0.5 MG tablet Take 0.5 mg by mouth 2 (two) times daily as needed for anxiety.   Marland Kitchen atorvastatin (LIPITOR) 10 MG tablet Take 1 tablet (10 mg total) by mouth daily.  Marland Kitchen BENICAR 5 MG tablet TAKE 1 TABLET DAILY  . cholecalciferol (VITAMIN D) 1000 UNITS tablet Take 5,000 Units by mouth every morning.   . citalopram (CELEXA) 20 MG tablet Take 1 tablet by mouth daily.  . clopidogrel (PLAVIX) 75 MG tablet Take 75 mg by mouth daily.  . clotrimazole-betamethasone (LOTRISONE) cream Apply 1 application topically 2 (two) times daily as needed (Eczema).   .  docusate sodium (COLACE) 100 MG capsule Take 100 mg by mouth 2 (two) times daily.  . feeding supplement, GLUCERNA SHAKE, (GLUCERNA SHAKE) LIQD Take 237 mLs by mouth daily as needed.   Marland Kitchen FREESTYLE LITE test strip 1 each 3 (three) times daily.  . furosemide (LASIX) 40 MG tablet Take 1.5 tablets (60 mg total) by mouth daily.  . hydrALAZINE (APRESOLINE) 10 MG tablet Take 1 tablet (10 mg total) by mouth in the morning and at bedtime.  . insulin aspart (NOVOLOG FLEXPEN) 100 UNIT/ML FlexPen Inject 5 Units into the skin 3 (three) times daily with meals. When BG is over 100   . Insulin Glargine (LANTUS SOLOSTAR) 100 UNIT/ML Solostar Pen Inject 8 Units into the skin at bedtime.   Marland Kitchen loratadine (CLARITIN) 10 MG tablet Take 10 mg by mouth daily.  . macitentan (OPSUMIT) 10 MG tablet Take 1 tablet (10 mg total) by mouth daily.  . metoprolol (LOPRESSOR) 50 MG tablet Take 4m in the a.m.and 260min the pm (Patient taking differently: Take 12.5 mg by mouth 2 (two) times daily. )  . mirtazapine (REMERON) 30 MG tablet Take 30 mg by mouth at bedtime.  . Multiple Vitamin (MULTIVITAMIN) tablet Take 1 tablet by mouth daily. Reported on 02/06/2016  . pantoprazole (PROTONIX) 40 MG tablet TAKE 1 TABLET TWICE A DAY BEFORE MEALS  . Polyethyl Glycol-Propyl Glycol (SYSTANE OP) Apply to eye as needed.  . potassium chloride (KLOR-CON) 10 MEQ tablet TAKE 1 TABLET   DAILY AS DIRECTED WITH FUROSEMIDE  . psyllium (METAMUCIL SMOOTH TEXTURE) 28 % packet Take 1 packet by mouth as needed.   . thyroid (ARMOUR) 15 MG tablet Take 15 mg by mouth daily.  . Marland Kitchenmeclidinium bromide (INCRUSE ELLIPTA) 62.5 MCG/INH AEPB Inhale 1 puff into the lungs daily.  . ursodiol (ACTIGALL) 500 MG tablet TAKE 1 TABLET TWICE A DAY WITH MEALS   Cardiac Studies:   Carotid artery duplex 042018/04/30Bilateral internal carotid artery flow appears to be sluggish and may suggest complete occlusion. Left vertebral artery flow is undetectable (no flow). Antegrade  right vertebral artery flow. Consider different modality of imaging to better  define anatomy.  Right Heart Cath 04/29/2017, RA pressure 8/2, mean 2 mmHg. RV 78/1, EDP 3 mmHg. PA 80/18, mean 42 mmHg. PW 13/14, mean 11 mmHg. PA saturation was 73%, aortic saturation 98%. Cardiac output 4.88, cardiac index 2.85 by Fick.  Echocardiogram 01/03/2020:  1. Normal LV systolic function with visual EF 60-65%. Left ventricle  cavity is normal in size. Normal left ventricular wall thickness. Normal  global wall motion. Doppler evidence of grade II (pseudonormal) diastolic  dysfunction, elevated LAP. Calculated EF 56%.  2. Left atrial cavity is moderate to severely dilated.  3. Right atrial cavity is moderately dilated.  4. Mild (Grade I) mitral regurgitation.  5. Normal RV size and function.  Moderate tricuspid regurgitation. Moderate pulmonary hypertension. RVSP  measures 54 mmHg.  6. Moderate pulmonic regurgitation.  7. IVC is normal with blunted respiratory response.  8. Prior study dated Echocardiogram 10/21/2018, no significant change.   EKG: EKG 02/08/2020: Sinus rhythm with first-degree AV block at a rate of 66 bpm, right atrial enlargement, right axis deviation.  Incomplete right bundle branch block.  No evidence of ischemia.     SIX MIN WALK 05/24/2020 04/25/2019 04/25/2019  Supplimental Oxygen during Test? (L/min) Yes Yes -  O2 Flow Rate 2 - -  Type Continuous Continuous -  Laps - 6 6  Laps 5 - -  Partial Lap (in Meters) 0 0 -  Baseline Heartrate 73 69 -  Baseline SPO2 94 94 -  2 Minute Oxygen Saturation % 93 - -  2 Minute HR 86 - -  4 Minute Oxygen Saturation % 92 - -  4 Minute HR 92 - -  6 Minute Oxygen Saturation % 93 - -  6 Minute HR 92 - -  Heartrate - 102 -  SPO2 - 91 -  Heartrate - 83 -  SPO2 - 92 -  Stopped or Paused before Six Minutes No No -  Interpretation Hip pain - -  Distance Completed - 204 -  Distance Completed 100 - -  Tech Comments: Patient did not stop at all, but  walked a a very slow pace. pt walked 6 laps in 6 minutes -  Total distance 120 meters.   Assessment     ICD-10-CM   1. Primary pulmonary hypertension (HCC)  I27.0 6 minute walk  2. Essential hypertension  I10   3. Diastolic CHF, chronic (HCC)  I50.32 6 minute walk  4. Cryptogenic cirrhosis of liver [facility])  K74.69     Recommendations:   [name]  is a 86 y.o. female  with primary pulmonary hypertension, cryptogenic cirrhosis of the liver, presently on Opsumit since July 2018, did not tolerate Adcirca or Sildanafil due to visual side effects. She was evaluated by Salome Holmes by Dr. Suzi Roots who recommended continuing present medical therapy and to consider addition of Selexipag if disease progresses, has an appointment to see her soon.  She has history of breast cancer status post radical mastectomy and chemotherapy, well controlled diabetes mellitus, moderate coronary artery disease and bilateral carotid artery occlusion without stroke and is on chronic plavix. She is now 86 years of age and has done well.  Fortunately she is remained stable without decompensated heart failure, also states that she has not had any further ascites or swelling of her leg.   She is here on a 52-monthoffice visit, I reviewed her 6-minute walk test, she has markedly reduced her walking distance however patient is having severe right hip pain and  I am unable to a certain but it is elated to pulmonary hypertension, progression of pulmonary fibrosis or degenerative joint disease.  It appears that right hip pain started after she took the COVID-19 vaccine.  She also has an appointment to see Dr. Joylene Draft at [facility] on June 07, 2020.  I will forward her with this note.  In the absence of obvious right-sided heart failure, with marked decrease in 6-minute walk test, I am not convinced that pulmonary hypertension is progressing over right basal coarse crackles appears to be slightly more prominent.  Her  external labs reviewed, renal function has remained stable although her hemoglobin is fallen down, usually hemoglobin is around 11-13, however it is now at 10, she again had labs done in July 2021 with PCP which I do not have.  I have sent a note to her PCP to discontinue Plavix if hemoglobin is 10 or less.  Continue aspirin 81 mg daily.  I would like to see her back in 2 months instead of 3 months for close monitoring.    Adrian Prows, MD, [facility] 05/24/2020, 3:13 PM [facility] [place] Office: [phone]   Cc:Azucena Cecil, MD(pulmonary hypertension clinic)

## 2020-05-29 DIAGNOSIS — Z9981 Dependence on supplemental oxygen: Secondary | ICD-10-CM | POA: Diagnosis not present

## 2020-05-29 DIAGNOSIS — Z853 Personal history of malignant neoplasm of breast: Secondary | ICD-10-CM | POA: Diagnosis not present

## 2020-05-29 DIAGNOSIS — I5030 Unspecified diastolic (congestive) heart failure: Secondary | ICD-10-CM | POA: Diagnosis not present

## 2020-05-29 DIAGNOSIS — I272 Pulmonary hypertension, unspecified: Secondary | ICD-10-CM | POA: Diagnosis not present

## 2020-05-29 DIAGNOSIS — E039 Hypothyroidism, unspecified: Secondary | ICD-10-CM | POA: Diagnosis not present

## 2020-05-29 DIAGNOSIS — Z0000 Encounter for general adult medical examination without abnormal findings: Secondary | ICD-10-CM | POA: Diagnosis not present

## 2020-05-29 DIAGNOSIS — I779 Disorder of arteries and arterioles, unspecified: Secondary | ICD-10-CM | POA: Diagnosis not present

## 2020-05-29 DIAGNOSIS — E1159 Type 2 diabetes mellitus with other circulatory complications: Secondary | ICD-10-CM | POA: Diagnosis not present

## 2020-05-29 DIAGNOSIS — Z794 Long term (current) use of insulin: Secondary | ICD-10-CM | POA: Diagnosis not present

## 2020-05-31 ENCOUNTER — Ambulatory Visit: Payer: Self-pay | Admitting: *Deleted

## 2020-06-03 DIAGNOSIS — M79675 Pain in left toe(s): Secondary | ICD-10-CM | POA: Diagnosis not present

## 2020-06-03 DIAGNOSIS — E1151 Type 2 diabetes mellitus with diabetic peripheral angiopathy without gangrene: Secondary | ICD-10-CM | POA: Diagnosis not present

## 2020-06-03 DIAGNOSIS — M79674 Pain in right toe(s): Secondary | ICD-10-CM | POA: Diagnosis not present

## 2020-06-03 DIAGNOSIS — B351 Tinea unguium: Secondary | ICD-10-CM | POA: Diagnosis not present

## 2020-06-07 ENCOUNTER — Other Ambulatory Visit: Payer: Self-pay | Admitting: *Deleted

## 2020-06-07 DIAGNOSIS — R0600 Dyspnea, unspecified: Secondary | ICD-10-CM | POA: Diagnosis not present

## 2020-06-07 DIAGNOSIS — R911 Solitary pulmonary nodule: Secondary | ICD-10-CM | POA: Diagnosis not present

## 2020-06-07 DIAGNOSIS — I2721 Secondary pulmonary arterial hypertension: Secondary | ICD-10-CM | POA: Diagnosis not present

## 2020-06-07 NOTE — Patient Outreach (Signed)
[facility]) Care Management  [place]  06/07/2020   HAJRA PORT 1931/05/28 [id number]  [place] telephone call to patient.  Hipaa compliance verified. RN spoke with patient and daughter on speaker phone. Patient bp this am was 122/56 BS 108 Wt 140 lbs. Pais still having pain in her hip. Per patient when she gets up she holds onto items to get get started walking and then she does good without assistance. Patient has not had any recent falls since last outreach call.  Patient appetite is good. She has been getting out of the house by going to Dr appointments, getting her hair fixed and for rides. Patient is on O2 @ 2Liters and her oxygen saturation is 97%. Patient and daughter have agreed to follow up outreach calls.   Encounter Medications:  Outpatient Encounter Medications as of 06/07/2020  Medication Sig  . acetaminophen (TYLENOL) 500 MG tablet Take 500 mg by mouth every 8 (eight) hours as needed for mild pain or moderate pain.  Marland Kitchen ALPRAZolam (XANAX) 0.5 MG tablet Take 0.5 mg by mouth 2 (two) times daily as needed for anxiety.   Marland Kitchen atorvastatin (LIPITOR) 10 MG tablet Take 1 tablet (10 mg total) by mouth daily.  Marland Kitchen BENICAR 5 MG tablet TAKE 1 TABLET DAILY  . cholecalciferol (VITAMIN D) 1000 UNITS tablet Take 5,000 Units by mouth every morning.   . citalopram (CELEXA) 20 MG tablet Take 1 tablet by mouth daily.  . clopidogrel (PLAVIX) 75 MG tablet Take 75 mg by mouth daily.  . clotrimazole-betamethasone (LOTRISONE) cream Apply 1 application topically 2 (two) times daily as needed (Eczema).   . docusate sodium (COLACE) 100 MG capsule Take 100 mg by mouth 2 (two) times daily.  . feeding supplement, GLUCERNA SHAKE, (GLUCERNA SHAKE) LIQD Take 237 mLs by mouth daily as needed.   Marland Kitchen FREESTYLE LITE test strip 1 each 3 (three) times daily.  . furosemide (LASIX) 40 MG tablet Take 1.5 tablets (60 mg total) by mouth daily.  . hydrALAZINE (APRESOLINE) 10 MG tablet Take 1  tablet (10 mg total) by mouth in the morning and at bedtime.  . insulin aspart (NOVOLOG FLEXPEN) 100 UNIT/ML FlexPen Inject 5 Units into the skin 3 (three) times daily with meals. When BG is over 100   . Insulin Glargine (LANTUS SOLOSTAR) 100 UNIT/ML Solostar Pen Inject 8 Units into the skin at bedtime.   Marland Kitchen loratadine (CLARITIN) 10 MG tablet Take 10 mg by mouth daily.  . macitentan (OPSUMIT) 10 MG tablet Take 1 tablet (10 mg total) by mouth daily.  . metoprolol (LOPRESSOR) 50 MG tablet Take 17m in the a.m.and 263min the pm (Patient taking differently: Take 12.5 mg by mouth 2 (two) times daily. )  . mirtazapine (REMERON) 30 MG tablet Take 30 mg by mouth at bedtime.  . Multiple Vitamin (MULTIVITAMIN) tablet Take 1 tablet by mouth daily. Reported on 02/06/2016  . pantoprazole (PROTONIX) 40 MG tablet TAKE 1 TABLET TWICE A DAY BEFORE MEALS  . Polyethyl Glycol-Propyl Glycol (SYSTANE OP) Apply to eye as needed.  . potassium chloride (KLOR-CON) 10 MEQ tablet TAKE 1 TABLET   DAILY AS DIRECTED WITH FUROSEMIDE  . psyllium (METAMUCIL SMOOTH TEXTURE) 28 % packet Take 1 packet by mouth as needed.   . thyroid (ARMOUR) 15 MG tablet Take 15 mg by mouth daily.  . Marland Kitchenmeclidinium bromide (INCRUSE ELLIPTA) 62.5 MCG/INH AEPB Inhale 1 puff into the lungs daily.  . ursodiol (ACTIGALL) 500 MG tablet TAKE 1  TABLET TWICE A DAY WITH MEALS   No facility-administered encounter medications on file as of 06/07/2020.    Functional Status:  In your present state of health, do you have any difficulty performing the following activities: 11/02/2019  Hearing? N  Vision? N  Difficulty concentrating or making decisions? N  Walking or climbing stairs? Y  Comment uses a cane if long walks or walker  Dressing or bathing? N  Doing errands, shopping? Y  Comment Family goes with patient on errands  Preparing Food and eating ? N  Using the Toilet? N  In the past six months, have you accidently leaked urine? N  Do you have problems  with loss of bowel control? N  Managing your Medications? N  Housekeeping or managing your Housekeeping? Y  Comment Daughter assists  Some recent data might be hidden    Fall/Depression Screening: Fall Risk  06/07/2020 01/30/2020 11/02/2019  Falls in the past year? 0 0 0  Comment - - -  Number falls in past yr: 0 0 0  Injury with Fall? 0 0 0  Risk for fall due to : Impaired balance/gait;Impaired mobility Impaired mobility;Impaired balance/gait Impaired mobility;Impaired balance/gait  Risk for fall due to: Comment - - uses a cane or walker sometimes outside the house  Follow up Falls evaluation completed;Falls prevention discussed Falls evaluation completed;Falls prevention discussed Falls evaluation completed   PHQ 2/9 Scores 01/30/2020 11/02/2019  PHQ - 2 Score 0 0   Goals Addressed            This Visit's Progress   . Blood Pressure < 140/90       CARE PLAN ENTRY (see longtitudinal plan of care for additional care plan information)  Objective:  . Last practice recorded BP readings:  BP Readings from Last 3 Encounters:  05/24/20 (!) 134/50  02/08/20 (!) 155/78  11/21/19 (!) 129/48 .   Marland Kitchen Most recent eGFR/CrCl: No results found for: EGFR  No components found for: CRCL  Current Barriers:  Marland Kitchen Knowledge deficit related to self care management of hypertension  Case Manager Clinical Goal(s):  Marland Kitchen Over the next 90 days, patient will verbalize understanding of plan for hypertension management . Over the next 90 days, patient will attend all scheduled medical appointments: Pcp , Pulmonologist and Cardiologist . Over the next 90 days, patient will demonstrate improved adherence to prescribed treatment plan for hypertension as evidenced by taking all medications as prescribed, monitoring and recording blood pressure as directed, adhering to low sodium/DASH diet . Over the next 90 days, patient will demonstrate improved health management independence as evidenced by checking blood  pressure as directed and notifying PCP if SBP>190 or DBP > 110, taking all medications as prescribe, and adhering to a low sodium diet as discussed. . Over the next 90 days, patient will verbalize basic understanding of hypertension disease process and self health management plan as evidenced by controlled blood pressure readings and increasing exercise in exercise routine.  Interventions:  . Evaluation of current treatment plan related to hypertension self management and patient's adherence to plan as established by provider. . Discussed plans with patient for ongoing care management follow up and provided patient with direct contact information for care management team . Advised patient, providing education and rationale, to monitor blood pressure daily and record, calling PCP for findings outside established parameters.  . Reviewed scheduled/upcoming provider appointments including:  . Provided education regarding s/s of DASH diet/Low salt diet . Provided education regarding increasing physical activity .  Provided educational material: Exercise Program Activity Book . Provided Neurosurgeon on Planning Healthy Meals  Patient Self Care Activities:  . Self administers medications as prescribed . Attends all scheduled provider appointments . Calls provider office for new concerns, questions, or BP outside discussed parameters . Monitors BP and records as discussed . Adheres to a low sodium diet/DASH diet . Increase physical activity as tolerated  RN will follow up outreach within the month of October         Assessment:  BP 122/56 BS 108 Patient has a routine exercise  Patient is trying to eat healthy  Plan:  Discussed healthy eating Discussed increase routine exercises Provided education Planning healthy meals Provide education on Exercise Program activity booklet RN will follow up outreach call within the month of October Provided [place] Management [phone]

## 2020-06-09 ENCOUNTER — Other Ambulatory Visit: Payer: Self-pay | Admitting: Cardiology

## 2020-06-15 DIAGNOSIS — I10 Essential (primary) hypertension: Secondary | ICD-10-CM | POA: Diagnosis not present

## 2020-07-04 DIAGNOSIS — E113293 Type 2 diabetes mellitus with mild nonproliferative diabetic retinopathy without macular edema, bilateral: Secondary | ICD-10-CM | POA: Diagnosis not present

## 2020-07-04 DIAGNOSIS — Z9841 Cataract extraction status, right eye: Secondary | ICD-10-CM | POA: Diagnosis not present

## 2020-07-04 DIAGNOSIS — Z9842 Cataract extraction status, left eye: Secondary | ICD-10-CM | POA: Diagnosis not present

## 2020-07-04 DIAGNOSIS — H52213 Irregular astigmatism, bilateral: Secondary | ICD-10-CM | POA: Diagnosis not present

## 2020-07-04 DIAGNOSIS — H5211 Myopia, right eye: Secondary | ICD-10-CM | POA: Diagnosis not present

## 2020-07-04 DIAGNOSIS — H47012 Ischemic optic neuropathy, left eye: Secondary | ICD-10-CM | POA: Diagnosis not present

## 2020-07-09 ENCOUNTER — Other Ambulatory Visit: Payer: Self-pay

## 2020-07-09 ENCOUNTER — Emergency Department (HOSPITAL_COMMUNITY)
Admission: EM | Admit: 2020-07-09 | Discharge: 2020-07-09 | Disposition: A | Discharge status: Home / Self Care | Payer: Medicare Other | Attending: Emergency Medicine | Admitting: Emergency Medicine

## 2020-07-09 ENCOUNTER — Encounter (HOSPITAL_COMMUNITY): Payer: Self-pay | Admitting: Emergency Medicine

## 2020-07-09 DIAGNOSIS — I2510 Atherosclerotic heart disease of native coronary artery without angina pectoris: Secondary | ICD-10-CM | POA: Insufficient documentation

## 2020-07-09 DIAGNOSIS — N3091 Cystitis, unspecified with hematuria: Secondary | ICD-10-CM | POA: Diagnosis not present

## 2020-07-09 DIAGNOSIS — R21 Rash and other nonspecific skin eruption: Secondary | ICD-10-CM | POA: Insufficient documentation

## 2020-07-09 DIAGNOSIS — N3001 Acute cystitis with hematuria: Secondary | ICD-10-CM

## 2020-07-09 DIAGNOSIS — Z87891 Personal history of nicotine dependence: Secondary | ICD-10-CM | POA: Diagnosis not present

## 2020-07-09 DIAGNOSIS — E119 Type 2 diabetes mellitus without complications: Secondary | ICD-10-CM | POA: Insufficient documentation

## 2020-07-09 DIAGNOSIS — R300 Dysuria: Secondary | ICD-10-CM | POA: Diagnosis present

## 2020-07-09 DIAGNOSIS — I110 Hypertensive heart disease with heart failure: Secondary | ICD-10-CM | POA: Insufficient documentation

## 2020-07-09 DIAGNOSIS — E039 Hypothyroidism, unspecified: Secondary | ICD-10-CM | POA: Diagnosis not present

## 2020-07-09 DIAGNOSIS — I5031 Acute diastolic (congestive) heart failure: Secondary | ICD-10-CM | POA: Insufficient documentation

## 2020-07-09 LAB — COMPREHENSIVE METABOLIC PANEL
ALT: 15 U/L (ref 0–44)
AST: 22 U/L (ref 15–41)
Albumin: 3.9 g/dL (ref 3.5–5.0)
Alkaline Phosphatase: 85 U/L (ref 38–126)
Anion gap: 10 (ref 5–15)
BUN: 22 mg/dL (ref 8–23)
CO2: 26 mmol/L (ref 22–32)
Calcium: 8.9 mg/dL (ref 8.9–10.3)
Chloride: 98 mmol/L (ref 98–111)
Creatinine, Ser: 0.81 mg/dL (ref 0.44–1.00)
GFR calc Af Amer: >60 mL/min (ref >60)
GFR calc non Af Amer: >60 mL/min (ref >60)
Glucose, Bld: 184 mg/dL — ABNORMAL HIGH (ref 70–99)
Potassium: 3.6 mmol/L (ref 3.5–5.1)
Sodium: 134 mmol/L — ABNORMAL LOW (ref 135–145)
Total Bilirubin: 0.4 mg/dL (ref 0.3–1.2)
Total Protein: 7.6 g/dL (ref 6.5–8.1)

## 2020-07-09 LAB — URINALYSIS, ROUTINE W REFLEX MICROSCOPIC
Bilirubin Urine: NEGATIVE
Glucose, UA: NEGATIVE mg/dL
Ketones, ur: NEGATIVE mg/dL
Nitrite: NEGATIVE
Protein, ur: 100 mg/dL — AB
RBC / HPF: >50 RBC/hpf — ABNORMAL HIGH (ref 0–5)
Specific Gravity, Urine: 1.015 (ref 1.005–1.030)
WBC, UA: >50 WBC/hpf — ABNORMAL HIGH (ref 0–5)
pH: 5 (ref 5.0–8.0)

## 2020-07-09 LAB — CBC WITH DIFFERENTIAL/PLATELET
Abs Immature Granulocytes: 0.01 10*3/uL (ref 0.00–0.07)
Basophils Absolute: 0 10*3/uL (ref 0.0–0.1)
Basophils Relative: 1 %
Eosinophils Absolute: 0.1 10*3/uL (ref 0.0–0.5)
Eosinophils Relative: 2 %
HCT: 35.9 % — ABNORMAL LOW (ref 36.0–46.0)
Hemoglobin: 11.6 g/dL — ABNORMAL LOW (ref 12.0–15.0)
Immature Granulocytes: 0 %
Lymphocytes Relative: 30 %
Lymphs Abs: 1.4 10*3/uL (ref 0.7–4.0)
MCH: 34.1 pg — ABNORMAL HIGH (ref 26.0–34.0)
MCHC: 32.3 g/dL (ref 30.0–36.0)
MCV: 105.6 fL — ABNORMAL HIGH (ref 80.0–100.0)
Monocytes Absolute: 0.3 10*3/uL (ref 0.1–1.0)
Monocytes Relative: 7 %
Neutro Abs: 2.7 10*3/uL (ref 1.7–7.7)
Neutrophils Relative %: 60 %
Platelets: 182 10*3/uL (ref 150–400)
RBC: 3.4 MIL/uL — ABNORMAL LOW (ref 3.87–5.11)
RDW: 13.2 % (ref 11.5–15.5)
WBC: 4.5 10*3/uL (ref 4.0–10.5)
nRBC: 0 % (ref 0.0–0.2)

## 2020-07-09 LAB — CBG MONITORING, ED: Glucose-Capillary: 148 mg/dL — ABNORMAL HIGH (ref 70–99)

## 2020-07-09 MED ORDER — CEPHALEXIN 500 MG PO CAPS: 500.0000 mg | ORAL_CAPSULE | Freq: Four times a day (QID) | ORAL | 0 refills | Status: AC

## 2020-07-09 MED ORDER — CEPHALEXIN 500 MG PO CAPS
500.0000 mg | ORAL_CAPSULE | Freq: Once | ORAL | Status: AC
  Administered 2020-07-09: 500 mg via ORAL
  Filled 2020-07-09: qty 1

## 2020-07-09 NOTE — Discharge Instructions (Signed)
You were seen in the emergency room today with urinary tract infection.  I am starting you on antibiotics will have you follow with your primary doctor the next 1 to 2 weeks.  Please monitor the rash in the foot and bring this up at your next PCP appointment.  Your lab work here looked normal.  If you develop fever, worsening abdominal pain, confusion you should return to the emergency department immediately for reevaluation.

## 2020-07-09 NOTE — ED Provider Notes (Signed)
Emergency Department Provider Note   I have reviewed the triage vital signs and the nursing notes.   HISTORY  Chief Complaint Dysuria   HPI [name] is a 86 y.o. female with past medical history reviewed below presents to the emergency department with dysuria and increased frequency.  Symptoms began yesterday.  Patient denies any fevers or chills.  No back or flank pain.  She reports lower abdominal discomfort radiating up her abdomen during urination only but pain resolves once urination has completed.  She not experiencing chest pain or shortness of breath.  She is here with a family member who confirms that she has not been seeming confused or weak.  No diarrhea or other recent antibiotics.   Family member also notes some bruising to the right foot with no injury.  Symptoms are present over the past several days but no clear reason for the rash. Patient denies any pain to this area.     Past Medical History:  Diagnosis Date  . Acute delirium 06/23/2013  . Acute diastolic congestive heart failure ([place]) 03/21/2014  . Anxiety   . Cancer ([place])   . Carotid artery disease (H[facility])    Bilateral ICA occlusion  . Chronic headaches   . Chronic liver disease    ?cirrhosis on 10/2014 CT. H/O elevated alkphos and +AMA on URSO.  Marland Kitchen Coronary atherosclerosis of native coronary artery    Mild nonobstructive disease at cardiac catheterization May 2011  . Depression   . Essential hypertension, benign   . Fracture of femoral neck, right, closed ([place]) 03/28/2014  . History of breast cancer   . History of GI bleed   . Hyperlipidemia   . Orthostatic hypotension    Diabetic polyneuropathy/diabetic dysautonomia   . Osteopenia   . Parotid tumor   . Pneumonia 11/2016  . Pneumonia   . Pressure ulcer, heel, right, unstageable ([place]) 04/17/2014  . PSVT (paroxysmal supraventricular tachycardia) ([place])   . Syncope and collapse 04/15/2010   Qualifier: Diagnosis of  By: Angelena Form, MD, Harrell Gave    .  Type 2 diabetes mellitus ([place])   . UPPER GASTROINTESTINAL HEMORRHAGE 03/21/2007   Qualifier: Diagnosis of  By: Jonna Munro MD, Cornelius      Patient Active Problem List   Diagnosis Date Noted  . Globus sensation 04/20/2019  . Acute on chronic respiratory failure with hypoxia ([place])   . CAP (community acquired pneumonia) 07/19/2018  . Flatulence/wind 07/06/2018  . Pulmonary hypertension ([place]) 04/25/2017  . Dysphagia 07/30/2016  . Hypothyroid 12/16/2015  . Cirrhosis of liver ([place]) 07/17/2015  . Congestive heart disease ([place])   . Type 2 diabetes mellitus without complication ([place])   . Diastolic CHF, chronic ([place]) 10/25/2014  . Bloating 06/27/2014  . SVT (supraventricular tachycardia) ([place]) 04/17/2014  . Pressure ulcer, heel, right, unstageable ([place]) 04/17/2014  . Anemia 04/17/2014  . Fracture of femoral neck, right, closed ([place]) 03/28/2014  . Hip fracture ([place]) 03/21/2014  . Acute diastolic congestive heart failure ([place]) 03/21/2014  . Abnormal carotid duplex scan 02/20/2014  . Orthostatic hypotension 02/20/2014  . Right sided weakness 02/16/2014  . Loss of weight 06/23/2013  . Primary biliary cirrhosis ([place]) 06/12/2013  . Leg weakness, bilateral 06/12/2013  . PSVT (paroxysmal supraventricular tachycardia) ([place]) 06/12/2013  . Chronic cough 11/28/2011  . Dyspnea 10/21/2011  . H/O tobacco use, presenting hazards to health 10/21/2011  . Coronary artery disease involving native coronary artery of native heart with angina pectoris ([place]) 04/15/2010  . Type 2 diabetes mellitus ([place])  07/30/2008  . TREMOR, LEFT HAND 05/07/2008  . SCAR CONDITION AND FIBROSIS OF SKIN 03/05/2008  . Hyperlipidemia 10/11/2006  . Anxiety state 10/11/2006  . Depression 10/11/2006  . Essential hypertension 10/11/2006  . OSTEOPENIA 10/11/2006  . NEOPLASM, MALIGNANT, BREAST, HX OF 10/11/2006    Past Surgical History:  Procedure Laterality Date  . ABDOMINAL HYSTERECTOMY    . APPENDECTOMY    . CATARACT  EXTRACTION Bilateral    Dr. Gershon Crane  . CHOLECYSTECTOMY  2008  . COLONOSCOPY  2010   Dr. Oneida Alar: single tubular adenoma, one hyperplastic polyp. Next TCS 2020 health permitting.  . ESOPHAGOGASTRODUODENOSCOPY N/A 08/17/2016   Procedure: ESOPHAGOGASTRODUODENOSCOPY (EGD);  Surgeon: Danie Binder, MD;  [facility];  Service: Endoscopy;  Laterality: N/A;  8:45 am  . ESOPHAGOGASTRODUODENOSCOPY N/A 01/01/2017   Procedure: ESOPHAGOGASTRODUODENOSCOPY (EGD);  Surgeon: Danie Binder, MD;  Location: [facility];  Service: Endoscopy;  Laterality: N/A;  9:15 am  . HEMORRHOID SURGERY  1960s  . HIP ARTHROPLASTY Right 03/28/2014   Procedure: ARTHROPLASTY BIPOLAR HIP;  Surgeon: Carole Civil, MD;  Location: [facility];  Service: Orthopedics;  Laterality: Right;  . LAPAROSCOPIC APPENDECTOMY N/A 04/13/2013   Procedure: APPENDECTOMY LAPAROSCOPIC;  Surgeon: Donato Heinz, MD;  Location: [facility];  Service: General;  Laterality: N/A;  . MASTECTOMY  1994   Left breast -related to breast cancer  . RIGHT HEART CATH N/A 04/29/2017   Procedure: Right Heart Cath;  Surgeon: Adrian Prows, MD;  Location: [facility];  Service: Cardiovascular;  Laterality: N/A;  . RIGHT/LEFT HEART CATH AND CORONARY ANGIOGRAPHY N/A 04/13/2017   Procedure: RIGHT/LEFT HEART CATH AND CORONARY ANGIOGRAPHY;  Surgeon: Adrian Prows, MD;  Location: [facility];  Service: Cardiovascular;  Laterality: N/A;  . SAVORY DILATION N/A 08/17/2016   Procedure: SAVORY DILATION;  Surgeon: Danie Binder, MD;  [facility];  Service: Endoscopy;  Laterality: N/A;  . VESICOVAGINAL FISTULA CLOSURE W/ TAH  1979  . YAG LASER APPLICATION Left    Dr. Zadie Rhine    Allergies Aricept Reather Littler hcl], Namenda [memantine hcl], Adcirca [tadalafil], Donepezil, Spironolactone, Codeine, Indocin [indomethacin], Latex, and Penicillins  Family History  Problem Relation Age of Onset  . Heart failure Mother   . Kidney cancer Mother   . Cancer  Mother   . Hypertension Mother   . Lung cancer Father        Brain METS  . Cancer Father   . Cirrhosis Brother   . Kidney cancer Sister   . Cancer Sister   . Hypertension Sister   . Diabetes Son   . Heart attack Neg Hx   . Stroke Neg Hx     Social History Social History   Tobacco Use  . Smoking status: Former Smoker    Packs/day: 0.50    Years: 40.00    Pack years: 20.00    Types: Cigarettes    Quit date: 03/30/2013    Years since quitting: 7.2  . Smokeless tobacco: Never Used  . Tobacco comment: Quit since 03/2013  Vaping Use  . Vaping Use: Never used  Substance Use Topics  . Alcohol use: No    Alcohol/week: 0.0 standard drinks  . Drug use: No    Review of Systems  Constitutional: No fever/chills Eyes: No visual changes. ENT: No sore throat. Cardiovascular: Denies chest pain. Respiratory: Denies shortness of breath. Gastrointestinal: Positive abdominal pain during urination only.  No nausea, no vomiting.  No diarrhea.  No constipation.  Genitourinary: Positive dysuria and increased frequency.  Musculoskeletal: Negative for back pain. Skin: Negative for rash. Neurological: Negative for headaches, focal weakness or numbness.  10-point ROS otherwise negative.  ____________________________________________   PHYSICAL EXAM:  VITAL SIGNS: ED Triage Vitals  Enc Vitals Group     BP 07/09/20 1537 (!) 171/60     Pulse Rate 07/09/20 1537 67     Resp 07/09/20 1537 20     Temp 07/09/20 1537 98.9 F (37.2 C)     Temp Source 07/09/20 1537 Oral     SpO2 07/09/20 1537 97 %     Weight 07/09/20 1540 148 lb (67.1 kg)     Height 07/09/20 1540 _0  (1.626 m)   Constitutional: Alert and oriented. Well appearing and in no acute distress. Eyes: Conjunctivae are normal. Head: Atraumatic. Nose: No congestion/rhinnorhea. Mouth/Throat: Mucous membranes are moist.   Neck: No stridor.   Cardiovascular: Normal rate, regular rhythm. Good peripheral circulation. Grossly normal  heart sounds.   Respiratory: Normal respiratory effort.  No retractions. Lungs CTAB. Gastrointestinal: Soft and nontender. No distention.  Musculoskeletal: No lower extremity tenderness nor edema. No gross deformities of extremities. Neurologic:  Normal speech and language. No gross focal neurologic deficits are appreciated.  Skin:  Skin is warm, dry and intact. Fine, blanching, rash noted over the right foot. No petechia. No induration. Not warm to touch. No joint redness/swelling. No bony tenderness over the ankle or foot.    ____________________________________________   LABS (all labs ordered are listed, but only abnormal results are displayed)  Labs Reviewed  COMPREHENSIVE METABOLIC PANEL - Abnormal; Notable for the following components:      Result Value   Sodium 134 (*)    Glucose, Bld 184 (*)    All other components within normal limits  URINALYSIS, ROUTINE W REFLEX MICROSCOPIC - Abnormal; Notable for the following components:   APPearance CLOUDY (*)    Hgb urine dipstick LARGE (*)    Protein, ur 100 (*)    Leukocytes,Ua LARGE (*)    RBC / HPF >50 (*)    WBC, UA >50 (*)    Bacteria, UA FEW (*)    Non Squamous Epithelial 0-5 (*)    All other components within normal limits  CBC WITH DIFFERENTIAL/PLATELET - Abnormal; Notable for the following components:   RBC 3.40 (*)    Hemoglobin 11.6 (*)    HCT 35.9 (*)    MCV 105.6 (*)    MCH 34.1 (*)    All other components within normal limits  CBG MONITORING, ED - Abnormal; Notable for the following components:   Glucose-Capillary 148 (*)    All other components within normal limits  URINE CULTURE   ____________________________________________  RADIOLOGY  None  ____________________________________________   PROCEDURES  Procedure(s) performed:   Procedures  None  ____________________________________________   INITIAL IMPRESSION / ASSESSMENT AND PLAN / ED COURSE  Pertinent labs & imaging results that were  available during my care of the patient were reviewed by me and considered in my medical decision making (see chart for details).   Patient presents to the emergency department with dysuria and increased frequency.  Abdominal pain is only with urination and resolves immediately after urination is completed.  She has no tenderness to palpation of her abdomen.  Vital signs here are reassuring and largely within normal limits other than elevated blood pressure.  Patient is not having encephalopathy related to urinary tract infection.  Her UA does show evidence of infection and  will send this for culture as well.  I reviewed prior urine cultures with no organisms isolated in the cultures available for my review.  No flank/CVA tenderness.  Plan for Keflex over the next week along with close PCP follow-up. Discussed ED return precautions.  Patient with nonspecific rash to the right foot and ankle.  This does not appear cellulitic or infectious.  Platelets are normal and there is no bony tenderness to suspect injury.  Plan for watchful waiting and discussion with PCP in the next 1-2 weeks if this does not resolve.    ____________________________________________  FINAL CLINICAL IMPRESSION(S) / ED DIAGNOSES  Final diagnoses:  Acute cystitis with hematuria  Rash     MEDICATIONS GIVEN DURING THIS VISIT:  Medications  cephALEXin (KEFLEX) capsule 500 mg (500 mg Oral Given 07/09/20 2132)     NEW OUTPATIENT MEDICATIONS STARTED DURING THIS VISIT:  Discharge Medication List as of 07/09/2020  9:45 PM    START taking these medications   Details  cephALEXin (KEFLEX) 500 MG capsule Take 1 capsule (500 mg total) by mouth 4 (four) times daily for 7 days., Starting Tue 07/09/2020, Until Tue 07/16/2020, Normal        Note:  This document was prepared using Dragon voice recognition software and may include unintentional dictation errors.  Nanda Quinton, MD, [facility]    Bristyl Mclees, [facility],  MD 07/10/20 1000

## 2020-07-09 NOTE — ED Triage Notes (Signed)
Pt c/o of dysuria with increased frequency since last night.

## 2020-07-12 LAB — URINE CULTURE: Culture: >=100000 — AB

## 2020-07-14 ENCOUNTER — Telehealth (HOSPITAL_BASED_OUTPATIENT_CLINIC_OR_DEPARTMENT_OTHER): Payer: Self-pay | Admitting: Emergency Medicine

## 2020-07-14 NOTE — Telephone Encounter (Signed)
Post ED Visit - Positive Culture Follow-up  Culture report reviewed by antimicrobial stewardship pharmacist: [place] Team _0  Elenor Quinones, Pharm.D. _1  Heide Guile, Pharm.D., BCPS AQ-ID _2  Parks Neptune, Pharm.D., BCPS _3  Alycia Rossetti, Pharm.D., BCPS _4  [place], [place].D., BCPS, AAHIVP _5  Legrand Como, Pharm.D., BCPS, AAHIVP _6  Salome Arnt, PharmD, BCPS _7  Johnnette Gourd, PharmD, BCPS _8  Hughes Better, PharmD, BCPS _9  Duanne Limerick, PharmD _10  Laqueta Linden, PharmD, BCPS _11  Albertina Parr, PharmD  [place] Team _12  Leodis Sias, PharmD _13  Lindell Spar, PharmD _14  Royetta Asal, PharmD _15  Graylin Shiver, Rph _16  Rema Fendt) Glennon Mac, PharmD _17  Arlyn Dunning, PharmD _18  Netta Cedars, PharmD _19  Dia Sitter, PharmD _20  Leone Haven, PharmD _21  Gretta Arab, PharmD _22  Theodis Shove, PharmD _23  Peggyann Juba, PharmD _24  Reuel Boom, PharmD   Positive urine culture Treated with Cepahlexin, organism sensitive to the same and no further patient follow-up is required at this time.  Sandi Raveling Gammons 07/14/2020, 1:52 PM

## 2020-07-15 ENCOUNTER — Other Ambulatory Visit: Payer: Self-pay | Admitting: *Deleted

## 2020-07-15 NOTE — Patient Outreach (Signed)
[facility]) Care Management  07/15/2020  [name] July 14, 1931 [id number]  Unsuccessful outreach attempt made to patient and daughter. Nurse called to follow-up with the patient today to see if she could be of any assistance after daughter placed a nurse-line call yesterday due to concerns that her mother was having abdominal pain, no BM x 2 days, weakness, after recently being diagnosed with cystitis with hematuria from an ED visit on 07/09/20.  RN Health Coach left HIPAA compliant voicemail message along with her contact information and requested a callback if she could assist in anyway.  Plan: Nurse will update [name] who is the patient's Nurse Health Coach from [facility] regarding the nurse-line call.   Emelia Loron RN, BSN [place] [phone] [email or web address]

## 2020-07-16 DIAGNOSIS — I10 Essential (primary) hypertension: Secondary | ICD-10-CM | POA: Diagnosis not present

## 2020-07-17 DIAGNOSIS — R109 Unspecified abdominal pain: Secondary | ICD-10-CM | POA: Diagnosis not present

## 2020-07-17 DIAGNOSIS — N390 Urinary tract infection, site not specified: Secondary | ICD-10-CM | POA: Diagnosis not present

## 2020-07-25 ENCOUNTER — Encounter: Payer: Self-pay | Admitting: Cardiology

## 2020-07-25 ENCOUNTER — Other Ambulatory Visit: Payer: Self-pay

## 2020-07-25 ENCOUNTER — Ambulatory Visit: Payer: Medicare Other | Admitting: Cardiology

## 2020-07-25 VITALS — BP 151/74 | HR 72 | Resp 16 | Ht 68.0 in | Wt 147.0 lb

## 2020-07-25 DIAGNOSIS — I270 Primary pulmonary hypertension: Secondary | ICD-10-CM

## 2020-07-25 DIAGNOSIS — I5032 Chronic diastolic (congestive) heart failure: Secondary | ICD-10-CM | POA: Diagnosis not present

## 2020-07-25 NOTE — Progress Notes (Signed)
Primary Physician/Referring:  [name], MD  Patient ID: [name], female    DOB: November 25, 1930, 86 y.o.   MRN: [id number]  Chief Complaint  Patient presents with  . Primary Pulmonary Hypertension  . Shortness of Breath  . Follow-up    2 months - with 6 minute walk   HPI: [name]  is a 86 y.o. female  with primary pulmonary hypertension, cryptogenic cirrhosis of the liver, presently on Opsumit since July 2018, did not tolerate Adcirca or Sildanafil due to visual side effects. She is also being followed at [facility] by Dr. Suzi Roots, last seen in July 2021 in November 2018 recommended continuing present medical therapy in view of advanced age and other underlying comorbidity difficult to delineate 6-minute walk test was appropriate.  Today patient is accompanied by daughter, recently had UTI and that had made her weak but has recuperated well.  She was able to complete the 6-minute walk test without any difficulty today.  Denies leg edema, denies worsening dyspnea, no chest pain or palpitations.  Past Medical History:  Diagnosis Date  . Acute delirium 06/23/2013  . Acute diastolic congestive heart failure ([place]) 03/21/2014  . Anxiety   . Cancer ([place])   . Carotid artery disease (HCC)    Bilateral ICA occlusion  . Chronic headaches   . Chronic liver disease    ?cirrhosis on 10/2014 CT. H/O elevated alkphos and +AMA on URSO.  Marland Kitchen Coronary atherosclerosis of native coronary artery    Mild nonobstructive disease at cardiac catheterization May 2011  . Depression   . Essential hypertension, benign   . Fracture of femoral neck, right, closed ([place]) 03/28/2014  . History of breast cancer   . History of GI bleed   . Hyperlipidemia   . Orthostatic hypotension    Diabetic polyneuropathy/diabetic dysautonomia   . Osteopenia   . Parotid tumor   . Pneumonia 11/2016  . Pneumonia   . Pressure ulcer, heel, right, unstageable ([place]) 04/17/2014  . PSVT (paroxysmal supraventricular  tachycardia) ([place])   . Syncope and collapse 04/15/2010   Qualifier: Diagnosis of  By: Angelena Form, MD, Harrell Gave    . Type 2 diabetes mellitus ([place])   . UPPER GASTROINTESTINAL HEMORRHAGE 03/21/2007   Qualifier: Diagnosis of  By: Jonna Munro MD, Roderic Scarce      Past Surgical History:  Procedure Laterality Date  . ABDOMINAL HYSTERECTOMY    . APPENDECTOMY    . CATARACT EXTRACTION Bilateral    Dr. Gershon Crane  . CHOLECYSTECTOMY  2008  . COLONOSCOPY  2010   Dr. Oneida Alar: single tubular adenoma, one hyperplastic polyp. Next TCS 2020 health permitting.  . ESOPHAGOGASTRODUODENOSCOPY N/A 08/17/2016   Procedure: ESOPHAGOGASTRODUODENOSCOPY (EGD);  Surgeon: Danie Binder, MD;  [facility];  Service: Endoscopy;  Laterality: N/A;  8:45 am  . ESOPHAGOGASTRODUODENOSCOPY N/A 01/01/2017   Procedure: ESOPHAGOGASTRODUODENOSCOPY (EGD);  Surgeon: Danie Binder, MD;  Location: [facility];  Service: Endoscopy;  Laterality: N/A;  9:15 am  . HEMORRHOID SURGERY  1960s  . HIP ARTHROPLASTY Right 03/28/2014   Procedure: ARTHROPLASTY BIPOLAR HIP;  Surgeon: Carole Civil, MD;  Location: [facility] OR[facility];  Service: Orthopedics;  Laterality: Right;  . LAPAROSCOPIC APPENDECTOMY N/A 04/13/2013   Procedure: APPENDECTOMY LAPAROSCOPIC;  Surgeon: Donato Heinz, MD;  Location: [facility];  Service: General;  Laterality: N/A;  . MASTECTOMY  1994   Left breast -related to breast cancer  . RIGHT HEART CATH N/A 04/29/2017   Procedure: Right Heart Cath;  Surgeon:  Adrian Prows, MD;  Location: [place] [facility];  Service: Cardiovascular;  Laterality: N/A;  . RIGHT/LEFT HEART CATH AND CORONARY ANGIOGRAPHY N/A 04/13/2017   Procedure: RIGHT/LEFT HEART CATH AND CORONARY ANGIOGRAPHY;  Surgeon: Adrian Prows, MD;  Location: [place] [facility];  Service: Cardiovascular;  Laterality: N/A;  . SAVORY DILATION N/A 08/17/2016   Procedure: SAVORY DILATION;  Surgeon: Danie Binder, MD;  [facility];  Service: Endoscopy;  Laterality: N/A;  .  VESICOVAGINAL FISTULA CLOSURE W/ TAH  1979  . YAG LASER APPLICATION Left    Dr. Zadie Rhine   Social History   Tobacco Use  . Smoking status: Former Smoker    Packs/day: 0.50    Years: 40.00    Pack years: 20.00    Types: Cigarettes    Quit date: 03/30/2013    Years since quitting: 7.3  . Smokeless tobacco: Never Used  . Tobacco comment: Quit since 03/2013  Substance Use Topics  . Alcohol use: No    Alcohol/week: 0.0 standard drinks   Review of Systems  Eyes: Positive for blurred vision.  Cardiovascular: Positive for dyspnea on exertion. Negative for chest pain and leg swelling.  Musculoskeletal: Positive for arthritis and joint pain.  Gastrointestinal: Negative for melena.   Objective  Blood pressure (!) 151/74, pulse 72, resp. rate 16, height _0  (1.727 m), weight 147 lb (66.7 kg), SpO2 95 %. Body mass index is 22.35 kg/m. Vitals with BMI 07/25/2020 07/09/2020 07/09/2020  Height _1  - -  Weight 147 lbs - -  BMI 67.20 - -  Systolic 947 096 283  Diastolic 74 62 57  Pulse 72 62 54   Physical Exam Constitutional:      General: She is not in acute distress.    Appearance: She is well-developed.  Cardiovascular:     Rate and Rhythm: Normal rate and regular rhythm.     Pulses: Intact distal pulses.     Heart sounds: Murmur heard.  Blowing midsystolic murmur is present with a grade of 2/6 at the lower left sternal border.  No gallop.      Comments: Normal S2, loud S2.  No leg edema. No JVD Pulmonary:     Effort: Pulmonary effort is normal.     Breath sounds: Examination of the left-middle field reveals rales. Examination of the left-lower field reveals rhonchi and rales. Rhonchi and rales present.  Abdominal:     General: Bowel sounds are normal.     Palpations: Abdomen is soft.  Musculoskeletal:     Cervical back: Neck supple.    Radiology: No results found.  Laboratory examination:   CMP Latest Ref Rng & Units 07/09/2020 05/08/2019 12/26/2018  Glucose 70 - 99 mg/dL  184(H) - 167(H)  BUN 8 - 23 mg/dL 22 - 17  Creatinine 0.44 - 1.00 mg/dL 0.81 0.80 0.81  Sodium 135 - 145 mmol/L 134(L) - 135  Potassium 3.5 - 5.1 mmol/L 3.6 - 4.2  Chloride 98 - 111 mmol/L 98 - 96(L)  CO2 22 - 32 mmol/L 26 - 33(H)  Calcium 8.9 - 10.3 mg/dL 8.9 - 9.3  Total Protein 6.5 - 8.1 g/dL 7.6 - 7.4  Total Bilirubin 0.3 - 1.2 mg/dL 0.4 - 0.5  Alkaline Phos 38 - 126 U/L 85 - -  AST 15 - 41 U/L 22 - 19  ALT 0 - 44 U/L 15 - 10   CBC Latest Ref Rng & Units 07/09/2020 12/26/2018 07/20/2018  WBC 4.0 - 10.5 K/uL 4.5  4.1 4.0  Hemoglobin 12.0 - 15.0 g/dL 11.6(L) 12.1 11.2(L)  Hematocrit 36 - 46 % 35.9(L) 35.7 34.0(L)  Platelets 150 - 400 K/uL 182 219 196   External labs: Hemoglobin 10.400 02/03/2020; Platelets 188.000 02/03/2020  Creatinine, Serum 1.030 02/03/2020 Potassium 3.900 02/03/2020 ALT (SGPT) 26.000 02/02/2020  TSH 1.489 01/09/2020  Labs 06/23/2013: ANA positive. Hepatitis B and C negative. Negative for anti-smooth muscle antibody, mitochondrial antibody.  PRN Meds:. There are no discontinued medications. Current Meds  Medication Sig  . acetaminophen (TYLENOL) 500 MG tablet Take 500 mg by mouth every 8 (eight) hours as needed for mild pain or moderate pain.  Marland Kitchen ALPRAZolam (XANAX) 0.5 MG tablet Take 0.5 mg by mouth 2 (two) times daily as needed for anxiety.   Marland Kitchen atorvastatin (LIPITOR) 10 MG tablet Take 1 tablet (10 mg total) by mouth daily.  Marland Kitchen BENICAR 5 MG tablet TAKE 1 TABLET DAILY  . cholecalciferol (VITAMIN D) 1000 UNITS tablet Take 5,000 Units by mouth every morning.   . citalopram (CELEXA) 20 MG tablet Take 1 tablet by mouth daily.  . clopidogrel (PLAVIX) 75 MG tablet Take 75 mg by mouth daily.  . clotrimazole-betamethasone (LOTRISONE) cream Apply 1 application topically 2 (two) times daily as needed (Eczema).   . docusate sodium (COLACE) 100 MG capsule Take 100 mg by mouth 2 (two) times daily.  . feeding supplement, GLUCERNA SHAKE, (GLUCERNA SHAKE) LIQD Take 237 mLs by mouth  daily as needed.   Marland Kitchen FREESTYLE LITE test strip 1 each 3 (three) times daily.  . furosemide (LASIX) 40 MG tablet Take 1.5 tablets (60 mg total) by mouth daily.  . hydrALAZINE (APRESOLINE) 10 MG tablet Take 1 tablet (10 mg total) by mouth in the morning and at bedtime.  . insulin aspart (NOVOLOG FLEXPEN) 100 UNIT/ML FlexPen Inject 5 Units into the skin 3 (three) times daily with meals. When BG is over 100   . Insulin Glargine (LANTUS SOLOSTAR) 100 UNIT/ML Solostar Pen Inject 8 Units into the skin at bedtime.   Marland Kitchen loratadine (CLARITIN) 10 MG tablet Take 10 mg by mouth daily.  . macitentan (OPSUMIT) 10 MG tablet Take 1 tablet (10 mg total) by mouth daily.  . metoprolol (LOPRESSOR) 50 MG tablet Take 12m in the a.m.and 273min the pm (Patient taking differently: Take 12.5 mg by mouth 2 (two) times daily. )  . mirtazapine (REMERON) 30 MG tablet Take 30 mg by mouth at bedtime.  . Multiple Vitamin (MULTIVITAMIN) tablet Take 1 tablet by mouth daily. Reported on 02/06/2016  . pantoprazole (PROTONIX) 40 MG tablet TAKE 1 TABLET TWICE A DAY BEFORE MEALS  . Polyethyl Glycol-Propyl Glycol (SYSTANE OP) Apply to eye as needed.  . potassium chloride (KLOR-CON) 10 MEQ tablet TAKE 1 TABLET   DAILY AS DIRECTED WITH FUROSEMIDE  . psyllium (METAMUCIL SMOOTH TEXTURE) 28 % packet Take 1 packet by mouth as needed.   . thyroid (ARMOUR) 15 MG tablet Take 15 mg by mouth daily.  . Marland Kitchenmeclidinium bromide (INCRUSE ELLIPTA) 62.5 MCG/INH AEPB Inhale 1 puff into the lungs daily.  . ursodiol (ACTIGALL) 500 MG tablet TAKE 1 TABLET TWICE A DAY WITH MEALS   Cardiac Studies:   Carotid artery duplex [id number] internal carotid artery flow appears to be sluggish and may suggest complete occlusion. Left vertebral artery flow is undetectable (no flow). Antegrade right vertebral artery flow. Consider different modality of imaging to better define anatomy.  Right Heart Cath 04/29/2017, RA pressure 8/2, mean 2 mmHg. RV 78/1, EDP 3  mmHg. PA 80/18, mean 42 mmHg. PW 13/14, mean 11 mmHg. PA saturation was 73%, aortic saturation 98%. Cardiac output 4.88, cardiac index 2.85 by Fick.  Echocardiogram 01/03/2020:  1. Normal LV systolic function with visual EF 60-65%. Left ventricle  cavity is normal in size. Normal left ventricular wall thickness. Normal  global wall motion. Doppler evidence of grade II (pseudonormal) diastolic  dysfunction, elevated LAP. Calculated EF 56%.  2. Left atrial cavity is moderate to severely dilated.  3. Right atrial cavity is moderately dilated.  4. Mild (Grade I) mitral regurgitation.  5. Normal RV size and function.  Moderate tricuspid regurgitation. Moderate pulmonary hypertension. RVSP  measures 54 mmHg.  6. Moderate pulmonic regurgitation.  7. IVC is normal with blunted respiratory response.  8. Prior study dated Echocardiogram 10/21/2018, no significant change.   EKG: EKG 02/08/2020: Sinus rhythm with first-degree AV block at a rate of 66 bpm, right atrial enlargement, right axis deviation.  Incomplete right bundle branch block.  No evidence of ischemia.     SIX MIN WALK 07/25/2020 05/24/2020 04/25/2019 04/25/2019  Supplimental Oxygen during Test? (L/min) Yes Yes Yes -  O2 Flow Rate 3 2 - -  Type Continuous Continuous Continuous -  Laps - - 6 6  Laps 6 5 - -  Partial Lap (in Meters) 0 0 0 -  Baseline Heartrate 68 73 69 -  Baseline SPO2 100 94 94 -  2 Minute Oxygen Saturation % 99 93 - -  2 Minute HR 103 86 - -  4 Minute Oxygen Saturation % 94 92 - -  4 Minute HR 114 92 - -  6 Minute Oxygen Saturation % 93 93 - -  6 Minute HR 119 92 - -  Heartrate 118 - 102 -  SPO2 92 - 91 -  Heartrate - - 83 -  SPO2 - - 92 -  Stopped or Paused before Six Minutes No No No -  Interpretation - Hip pain - -  Distance Completed - - 204 -  Distance Completed 120 100 - -  Tech Comments: Patient did not take a break during the walk. Patient did not stop at all, but walked a a very slow pace. pt walked 6 laps  in 6 minutes -  Total distance 120 meters.   Assessment     ICD-10-CM   1. Primary pulmonary hypertension (HCC)  I27.0 6 minute walk  2. Diastolic CHF, chronic (HCC)  I50.32     Recommendations:   [name]  is a 86 y.o. AAF patient with primary pulmonary hypertension, cryptogenic cirrhosis of the liver, presently on Opsumit since July 2018, did not tolerate Adcirca or Sildanafil due to visual side effects. She is also being followed at [facility] by Dr. Suzi Roots, last seen in July 2021 in November 2018 recommended continuing present medical therapy in view of advanced age and other underlying comorbidity difficult to delineate 6-minute walk test was appropriate.   She has history of breast cancer status post radical mastectomy and chemotherapy, well controlled diabetes mellitus, moderate coronary artery disease and bilateral carotid artery occlusion without stroke and is on chronic plavix.   She is remained stable, 6-minute walk test was evaluated and this is remained stable as well.  Clinically there is no evidence of right-sided heart failure, no JVD, no leg edema.  I reviewed her labs from [facility] [place] [place] and proBNP was normal in July 2021.  She does have connective tissue disease.  Otherwise renal function  has remained stable and CBC has remained stable.  I will see her back in 6 months for follow-up.   Adrian Prows, MD, [facility] 07/25/2020, 3:47 PM Office: [phone]   Cc:Azucena Cecil, MD(pulmonary hypertension clinic)

## 2020-08-11 DIAGNOSIS — Z20828 Contact with and (suspected) exposure to other viral communicable diseases: Secondary | ICD-10-CM | POA: Diagnosis not present

## 2020-08-12 DIAGNOSIS — M79675 Pain in left toe(s): Secondary | ICD-10-CM | POA: Diagnosis not present

## 2020-08-12 DIAGNOSIS — E1151 Type 2 diabetes mellitus with diabetic peripheral angiopathy without gangrene: Secondary | ICD-10-CM | POA: Diagnosis not present

## 2020-08-12 DIAGNOSIS — B351 Tinea unguium: Secondary | ICD-10-CM | POA: Diagnosis not present

## 2020-08-12 DIAGNOSIS — M79674 Pain in right toe(s): Secondary | ICD-10-CM | POA: Diagnosis not present

## 2020-08-15 DIAGNOSIS — I10 Essential (primary) hypertension: Secondary | ICD-10-CM | POA: Diagnosis not present

## 2020-08-22 DIAGNOSIS — H1131 Conjunctival hemorrhage, right eye: Secondary | ICD-10-CM | POA: Diagnosis not present

## 2020-08-22 DIAGNOSIS — S0501XA Injury of conjunctiva and corneal abrasion without foreign body, right eye, initial encounter: Secondary | ICD-10-CM | POA: Diagnosis not present

## 2020-08-26 DIAGNOSIS — E7841 Elevated Lipoprotein(a): Secondary | ICD-10-CM | POA: Diagnosis not present

## 2020-08-26 DIAGNOSIS — I10 Essential (primary) hypertension: Secondary | ICD-10-CM | POA: Diagnosis not present

## 2020-08-26 DIAGNOSIS — E039 Hypothyroidism, unspecified: Secondary | ICD-10-CM | POA: Diagnosis not present

## 2020-08-26 DIAGNOSIS — E119 Type 2 diabetes mellitus without complications: Secondary | ICD-10-CM | POA: Diagnosis not present

## 2020-08-26 DIAGNOSIS — E118 Type 2 diabetes mellitus with unspecified complications: Secondary | ICD-10-CM | POA: Diagnosis not present

## 2020-09-02 DIAGNOSIS — E7841 Elevated Lipoprotein(a): Secondary | ICD-10-CM | POA: Diagnosis not present

## 2020-09-02 DIAGNOSIS — Z23 Encounter for immunization: Secondary | ICD-10-CM | POA: Diagnosis not present

## 2020-09-02 DIAGNOSIS — K5900 Constipation, unspecified: Secondary | ICD-10-CM | POA: Diagnosis not present

## 2020-09-02 DIAGNOSIS — E039 Hypothyroidism, unspecified: Secondary | ICD-10-CM | POA: Diagnosis not present

## 2020-09-02 DIAGNOSIS — I5030 Unspecified diastolic (congestive) heart failure: Secondary | ICD-10-CM | POA: Diagnosis not present

## 2020-09-02 DIAGNOSIS — R0609 Other forms of dyspnea: Secondary | ICD-10-CM | POA: Diagnosis not present

## 2020-09-02 DIAGNOSIS — I272 Pulmonary hypertension, unspecified: Secondary | ICD-10-CM | POA: Diagnosis not present

## 2020-09-02 DIAGNOSIS — I10 Essential (primary) hypertension: Secondary | ICD-10-CM | POA: Diagnosis not present

## 2020-09-02 DIAGNOSIS — L309 Dermatitis, unspecified: Secondary | ICD-10-CM | POA: Diagnosis not present

## 2020-09-02 DIAGNOSIS — E119 Type 2 diabetes mellitus without complications: Secondary | ICD-10-CM | POA: Diagnosis not present

## 2020-09-02 DIAGNOSIS — N393 Stress incontinence (female) (male): Secondary | ICD-10-CM | POA: Diagnosis not present

## 2020-09-02 DIAGNOSIS — I340 Nonrheumatic mitral (valve) insufficiency: Secondary | ICD-10-CM | POA: Diagnosis not present

## 2020-09-04 ENCOUNTER — Other Ambulatory Visit: Payer: Self-pay | Admitting: *Deleted

## 2020-09-04 NOTE — Patient Outreach (Signed)
[name]) Care Management  09/04/2020  [name] 21-Dec-1930 [id number]  RN Health Coach attempted follow up outreach call to patient.  Patient was unavailable. HIPPA compliance voicemail message left with return callback number.  Plan: RN will call patient again within 30 days.  [place] Care Management [phone]

## 2020-09-15 DIAGNOSIS — I10 Essential (primary) hypertension: Secondary | ICD-10-CM | POA: Diagnosis not present

## 2020-09-17 ENCOUNTER — Telehealth: Payer: Self-pay | Admitting: Internal Medicine

## 2020-09-17 NOTE — Telephone Encounter (Signed)
Recall for ultrasound  °

## 2020-09-17 NOTE — Telephone Encounter (Signed)
Patient is scheduled for OV with AB 11/10.

## 2020-09-25 ENCOUNTER — Ambulatory Visit (INDEPENDENT_AMBULATORY_CARE_PROVIDER_SITE_OTHER): Payer: Medicare Other | Admitting: Gastroenterology

## 2020-09-25 ENCOUNTER — Encounter: Payer: Self-pay | Admitting: Gastroenterology

## 2020-09-25 ENCOUNTER — Other Ambulatory Visit: Payer: Self-pay

## 2020-09-25 ENCOUNTER — Encounter: Payer: Self-pay | Admitting: Internal Medicine

## 2020-09-25 VITALS — BP 177/72 | HR 74 | Temp 97.7°F | Ht 64.0 in | Wt 146.0 lb

## 2020-09-25 DIAGNOSIS — R1310 Dysphagia, unspecified: Secondary | ICD-10-CM | POA: Diagnosis not present

## 2020-09-25 DIAGNOSIS — K219 Gastro-esophageal reflux disease without esophagitis: Secondary | ICD-10-CM | POA: Diagnosis not present

## 2020-09-25 DIAGNOSIS — K743 Primary biliary cirrhosis: Secondary | ICD-10-CM | POA: Diagnosis not present

## 2020-09-25 MED ORDER — PANTOPRAZOLE SODIUM 40 MG PO TBEC: DELAYED_RELEASE_TABLET | ORAL | 3 refills | Status: DC

## 2020-09-25 NOTE — Progress Notes (Signed)
  Referring Provider: Kim, James, MD Primary Care Physician:  Kim, James, MD Primary GI: Dr. Carver  Chief Complaint  Patient presents with  . Cirrhosis    f/u. Also due for US    HPI:   [name] is a 86 y.o. female presenting today with a history of PBC, cirrhosis, GERD, dysphagia. Last EGD in Feb 2018 with normal esophagus and gastritis. History of PUD in 2017 due to NSAIDs. Mildly elevated AFP historically, with MRI in 2018 negative for hematoma. Last AFP several years ago. US abdomen due now. Outside labs reviewed dated Oct 2021 with Hgb 12.2, Hct 35.5, Platelets 168, Tbili 0.40, AST 21, ALT 19, Alk Phos 102, creatinine 0.8.  Daughter present with her today. No abdominal pain. No mental status changes or confusion. Good appetite. On Oxygen. History of pulmonary hypertension. No overt GI bleeding. Feels like she has a narrowing again. Improvement in dilation historically. She is concerned about procedures now in light of Covid. Would like to hold off right now. Last EGD in 2018. Due for US now. Continues on Urso BID.      Past Medical History:  Diagnosis Date  . Acute delirium 06/23/2013  . Acute diastolic congestive heart failure (HCC) 03/21/2014  . Anxiety   . Cancer (HCC)   . Carotid artery disease (HCC)    Bilateral ICA occlusion  . Chronic headaches   . Chronic liver disease    ?cirrhosis on 10/2014 CT. H/O elevated alkphos and +AMA on URSO.  . Coronary atherosclerosis of native coronary artery    Mild nonobstructive disease at cardiac catheterization May 2011  . Depression   . Essential hypertension, benign   . Fracture of femoral neck, right, closed (HCC) 03/28/2014  . History of breast cancer   . History of GI bleed   . Hyperlipidemia   . Orthostatic hypotension    Diabetic polyneuropathy/diabetic dysautonomia   . Osteopenia   . Parotid tumor   . Pneumonia 11/2016  . Pneumonia   . Pressure ulcer, heel, right, unstageable (HCC) 04/17/2014  . PSVT (paroxysmal  supraventricular tachycardia) (HCC)   . Syncope and collapse 04/15/2010   Qualifier: Diagnosis of  By: McAlhany, MD, Christopher    . Type 2 diabetes mellitus (HCC)   . UPPER GASTROINTESTINAL HEMORRHAGE 03/21/2007   Qualifier: Diagnosis of  By: Ferreira MD, Cornelius      Past Surgical History:  Procedure Laterality Date  . ABDOMINAL HYSTERECTOMY    . APPENDECTOMY    . CATARACT EXTRACTION Bilateral    Dr. Shapiro  . CHOLECYSTECTOMY  2008  . COLONOSCOPY  2010   Dr. Fields: single tubular adenoma, one hyperplastic polyp. Next TCS 2020 health permitting.  . ESOPHAGOGASTRODUODENOSCOPY N/A 08/17/2016   web in upper third of esophagus s/p dilation, five gastric ulcers without bleeding  . ESOPHAGOGASTRODUODENOSCOPY N/A 01/01/2017    normal esophagus, gastritis  . HEMORRHOID SURGERY  1960s  . HIP ARTHROPLASTY Right 03/28/2014   Procedure: ARTHROPLASTY BIPOLAR HIP;  Surgeon: Stanley E Harrison, MD;  Location: [facility];  Service: Orthopedics;  Laterality: Right;  . LAPAROSCOPIC APPENDECTOMY N/A 04/13/2013   Procedure: APPENDECTOMY LAPAROSCOPIC;  Surgeon: Brent C Ziegler, MD;  Location: [facility];  Service: General;  Laterality: N/A;  . MASTECTOMY  1994   Left breast -related to breast cancer  . RIGHT HEART CATH N/A 04/29/2017   Procedure: Right Heart Cath;  Surgeon: Ganji, Jay, MD;  Location: [facility];  Service: Cardiovascular;  Laterality: N/A;  . RIGHT/LEFT   HEART CATH AND CORONARY ANGIOGRAPHY N/A 04/13/2017   Procedure: RIGHT/LEFT HEART CATH AND CORONARY ANGIOGRAPHY;  Surgeon: Ganji, Jay, MD;  Location: [facility];  Service: Cardiovascular;  Laterality: N/A;  . SAVORY DILATION N/A 08/17/2016   Procedure: SAVORY DILATION;  Surgeon: Sandi L Fields, MD;  Location: [facility];  Service: Endoscopy;  Laterality: N/A;  . VESICOVAGINAL FISTULA CLOSURE W/ TAH  1979  . YAG LASER APPLICATION Left    Dr. Rankin    Current Outpatient Medications  Medication Sig Dispense Refill  .  acetaminophen (TYLENOL) 500 MG tablet Take 500 mg by mouth every 8 (eight) hours as needed for mild pain or moderate pain.    . ALPRAZolam (XANAX) 0.5 MG tablet Take 0.5 mg by mouth 2 (two) times daily as needed for anxiety.     . atorvastatin (LIPITOR) 10 MG tablet Take 1 tablet (10 mg total) by mouth daily. 30 tablet 0  . BENICAR 5 MG tablet TAKE 1 TABLET DAILY 90 tablet 3  . cholecalciferol (VITAMIN D) 1000 UNITS tablet Take 5,000 Units by mouth every morning.     . citalopram (CELEXA) 20 MG tablet Take 1 tablet by mouth daily.    . clopidogrel (PLAVIX) 75 MG tablet Take 75 mg by mouth daily.    . clotrimazole-betamethasone (LOTRISONE) cream Apply 1 application topically 2 (two) times daily as needed (Eczema).     . docusate sodium (COLACE) 100 MG capsule Take 100 mg by mouth 2 (two) times daily.    . feeding supplement, GLUCERNA SHAKE, (GLUCERNA SHAKE) LIQD Take 237 mLs by mouth daily as needed.     . FREESTYLE LITE test strip 1 each 3 (three) times daily.    . furosemide (LASIX) 40 MG tablet Take 1.5 tablets (60 mg total) by mouth daily. 135 tablet 3  . hydrALAZINE (APRESOLINE) 10 MG tablet Take 1 tablet (10 mg total) by mouth in the morning and at bedtime. 180 tablet 2  . insulin aspart (NOVOLOG FLEXPEN) 100 UNIT/ML FlexPen Inject 5 Units into the skin 3 (three) times daily with meals. When BG is over 100     . Insulin Glargine (LANTUS SOLOSTAR) 100 UNIT/ML Solostar Pen Inject 8 Units into the skin at bedtime.     . loratadine (CLARITIN) 10 MG tablet Take 10 mg by mouth daily.    . macitentan (OPSUMIT) 10 MG tablet Take 1 tablet (10 mg total) by mouth daily. 90 tablet 3  . metoprolol (LOPRESSOR) 50 MG tablet Take 50mg in the a.m.and 25mg in the pm (Patient taking differently: Take 12.5 mg by mouth 2 (two) times daily. )    . mirtazapine (REMERON) 30 MG tablet Take 30 mg by mouth at bedtime.    . Multiple Vitamin (MULTIVITAMIN) tablet Take 1 tablet by mouth daily. Reported on 02/06/2016    .  pantoprazole (PROTONIX) 40 MG tablet TAKE 1 TABLET TWICE A DAY BEFORE MEALS 180 tablet 3  . Polyethyl Glycol-Propyl Glycol (SYSTANE OP) Apply to eye as needed.    . potassium chloride (KLOR-CON) 10 MEQ tablet TAKE 1 TABLET   DAILY AS DIRECTED WITH FUROSEMIDE 180 tablet 3  . psyllium (METAMUCIL SMOOTH TEXTURE) 28 % packet Take 1 packet by mouth as needed.     . thyroid (ARMOUR) 15 MG tablet Take 15 mg by mouth daily.    . umeclidinium bromide (INCRUSE ELLIPTA) 62.5 MCG/INH AEPB Inhale 1 puff into the lungs daily.    . ursodiol (ACTIGALL) 500 MG tablet TAKE 1   TABLET TWICE A DAY WITH MEALS 180 tablet 3   No current facility-administered medications for this visit.    Allergies as of 09/25/2020 - Review Complete 09/25/2020  Allergen Reaction Noted  . Aricept [donepezil hcl]  02/16/2014  . Namenda [memantine hcl]  02/16/2014  . Adcirca [tadalafil] Other (See Comments) 04/20/2019  . Donepezil Nausea Only 01/20/2016  . Spironolactone Other (See Comments) 12/16/2015  . Codeine Rash 02/16/2014  . Indocin [indomethacin] Rash 09/11/2014  . Latex Rash 02/16/2014  . Penicillins Rash 01/11/2007    Family History  Problem Relation Age of Onset  . Heart failure Mother   . Kidney cancer Mother   . Cancer Mother   . Hypertension Mother   . Lung cancer Father        Brain METS  . Cancer Father   . Cirrhosis Brother   . Kidney cancer Sister   . Cancer Sister   . Hypertension Sister   . Diabetes Son   . Heart attack Neg Hx   . Stroke Neg Hx     Social History   Socioeconomic History  . Marital status: Divorced    Spouse name: Not on file  . Number of children: 4  . Years of education: Not on file  . Highest education level: Not on file  Occupational History  . Occupation: RETIRED FACTORY WORKER    Comment: [employer]  Tobacco Use  . Smoking status: Former Smoker    Packs/day: 0.50    Years: 40.00    Pack years: 20.00    Types: Cigarettes    Quit date: 03/30/2013    Years since  quitting: 7.5  . Smokeless tobacco: Never Used  . Tobacco comment: Quit since 03/2013  Vaping Use  . Vaping Use: Never used  Substance and Sexual Activity  . Alcohol use: No    Alcohol/week: 0.0 standard drinks  . Drug use: No  . Sexual activity: Never  Other Topics Concern  . 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 Started age 89.   Drug use -no   Alcohol use - no   Lives with son   Education - 12 th grade                  Social Determinants of Health   Financial Resource Strain:   . Difficulty of Paying Living Expenses: Not on file  Food Insecurity: No Food Insecurity  . Worried About Charity fundraiser in the Last Year: Never true  . Ran Out of Food in the Last Year: Never true  Transportation Needs: No Transportation Needs  . Lack of Transportation (Medical): No  . Lack of Transportation (Non-Medical): No  Physical Activity:   . Days of Exercise per Week: Not on file  . Minutes of Exercise per Session: Not on file  Stress:   . Feeling of Stress : Not on file  Social Connections:   . Frequency of Communication with Friends and Family: Not on file  . Frequency of Social Gatherings with Friends and Family: Not on file  . Attends Religious Services: Not on file  . Active Member of Clubs or Organizations: Not on file  . Attends Archivist Meetings: Not on file  . Marital Status: Not on file  Review of Systems: Gen: Denies fever, chills, anorexia. Denies fatigue, weakness, weight loss.  CV: Denies chest pain, palpitations, syncope, peripheral edema, and claudication. Resp: Denies dyspnea at rest, cough, wheezing, coughing up blood, and pleurisy. GI: see HPI Derm: Denies rash, itching, dry skin Psych: Denies depression, anxiety, memory loss, confusion. No  homicidal or suicidal ideation.  Heme: Denies bruising, bleeding, and enlarged lymph nodes.  Physical Exam: BP (!) 177/72   Pulse 74   Temp 97.7 F (36.5 C)   Ht 5' 4" (1.626 m)   Wt 146 lb (66.2 kg)   BMI 25.06 kg/m  General:   Alert and oriented. No distress noted. Pleasant and cooperative.  Head:  Normocephalic and atraumatic. Eyes:  Conjuctiva clear without scleral icterus. Mouth:  Mask in place Abdomen:  +BS, soft, non-tender and non-distended. No rebound or guarding. No HSM or masses noted. Msk:  Symmetrical without gross deformities. Normal posture. Extremities:  Without edema. Neurologic:  Alert and  oriented x4 Psych:  Alert and cooperative. Normal mood and affect.  ASSESSMENT: [name] is an 86 y.o. female presenting today with history of PBC, cirrhosis, dysphagia, presenting in routine follow-up. Mildly elevated AFP historically, with MRI in 2018 negative for hematoma. Last AFP several years ago.  Outside labs reviewed dated Oct 2021 with Hgb 12.2, Hct 35.5, Platelets 168, Tbili 0.40, AST 21, ALT 19, Alk Phos 102, creatinine 0.8.  Remaining overall well-compensated. Continues on Urso BID. Will recheck AFP due to history of mild elevation historically.   Mild dysphagia noted: she wants to hold off on dilation at this time. Continue dietary/behavior modifications and call if worsening.    PLAN:  Urso BID  US Abdomen now  Will check AFP  Call if worsening dysphagia  Continue PPI BID  6 month return  Annitta Needs, PhD, [facility] Gastroenterology

## 2020-09-25 NOTE — Patient Instructions (Signed)
I am glad you are doing well!  Please let me know if swallowing worsens.   We are arranging a routine ultrasound of your liver.  We will see you in 6 months!  I enjoyed seeing you again today! As you know, I value our relationship and want to provide genuine, compassionate, and quality care. I welcome your feedback. If you receive a survey regarding your visit,  I greatly appreciate you taking time to fill this out. See you next time!  Annitta Needs, PhD, ANP-BC [facility] Gastroenterology

## 2020-10-01 ENCOUNTER — Encounter: Payer: Self-pay | Admitting: Gastroenterology

## 2020-10-02 ENCOUNTER — Other Ambulatory Visit: Payer: Self-pay

## 2020-10-02 ENCOUNTER — Telehealth: Payer: Self-pay | Admitting: Gastroenterology

## 2020-10-02 ENCOUNTER — Other Ambulatory Visit: Payer: Self-pay | Admitting: *Deleted

## 2020-10-02 DIAGNOSIS — K219 Gastro-esophageal reflux disease without esophagitis: Secondary | ICD-10-CM

## 2020-10-02 DIAGNOSIS — R772 Abnormality of alphafetoprotein: Secondary | ICD-10-CM

## 2020-10-02 DIAGNOSIS — K7460 Unspecified cirrhosis of liver: Secondary | ICD-10-CM

## 2020-10-02 NOTE — Telephone Encounter (Signed)
NOTED FAXED TO [facility] AT [facility].  PHONED PT LM ON VM FOR PT TO RETURN CALL

## 2020-10-02 NOTE — Telephone Encounter (Signed)
Can we order an AFP tumor marker to be done at her convenience? I saw patient last week but noticed this after she left. History of mildly elevated AFP. Just want to get an updated baseline.

## 2020-10-02 NOTE — Patient Outreach (Signed)
[place] [facility]) Care Management  10/02/2020  [name] August 08, 1931 [id number]  RN Health Coach attempted follow up outreach call to patient.  Patient was unavailable. HIPPA compliance voicemail message left on home phone and cell phone with return callback number.  Plan: RN sent unsuccessful outreach letter RN will call patient again within 30 days.  [place] Care Management [phone]

## 2020-10-04 ENCOUNTER — Ambulatory Visit (HOSPITAL_COMMUNITY)
Admission: RE | Admit: 2020-10-04 | Discharge: 2020-10-04 | Disposition: A | Discharge status: Home / Self Care | Payer: Medicare Other | Source: Ambulatory Visit | Attending: Gastroenterology | Admitting: Gastroenterology

## 2020-10-04 ENCOUNTER — Other Ambulatory Visit: Payer: Self-pay

## 2020-10-04 DIAGNOSIS — K7460 Unspecified cirrhosis of liver: Secondary | ICD-10-CM | POA: Diagnosis not present

## 2020-10-04 DIAGNOSIS — K743 Primary biliary cirrhosis: Secondary | ICD-10-CM | POA: Diagnosis not present

## 2020-10-04 NOTE — Telephone Encounter (Signed)
Phoned pt and LM for her to return call. Reminded we close at 12 today and she can call next week. Will also put a reminder in mail to her today regarding her having her labs done.

## 2020-10-04 NOTE — Progress Notes (Signed)
Dear [name], Just a friendly reminder to call our office regarding having your labs done when its most convenient for you.   Thank you, [name], CMA

## 2020-10-08 NOTE — Progress Notes (Signed)
On recall for ultrasound

## 2020-10-15 DIAGNOSIS — I10 Essential (primary) hypertension: Secondary | ICD-10-CM | POA: Diagnosis not present

## 2020-10-21 DIAGNOSIS — E1151 Type 2 diabetes mellitus with diabetic peripheral angiopathy without gangrene: Secondary | ICD-10-CM | POA: Diagnosis not present

## 2020-10-21 DIAGNOSIS — M79675 Pain in left toe(s): Secondary | ICD-10-CM | POA: Diagnosis not present

## 2020-10-21 DIAGNOSIS — B351 Tinea unguium: Secondary | ICD-10-CM | POA: Diagnosis not present

## 2020-10-21 DIAGNOSIS — M79674 Pain in right toe(s): Secondary | ICD-10-CM | POA: Diagnosis not present

## 2020-10-24 ENCOUNTER — Other Ambulatory Visit: Payer: Self-pay | Admitting: *Deleted

## 2020-10-24 NOTE — Patient Outreach (Signed)
[place] [facility]) Care Management  10/24/2020  [name] 17-May-1931 [id number]   RN Health Coach attempted follow up outreach call to patient.  Patient was unavailable. HIPPA compliance voicemail message left with return callback number.  Plan: RN will call patient again within 30 days.  [place] Care Management [phone]

## 2020-11-15 DIAGNOSIS — Z23 Encounter for immunization: Secondary | ICD-10-CM | POA: Diagnosis not present

## 2020-11-15 DIAGNOSIS — I10 Essential (primary) hypertension: Secondary | ICD-10-CM | POA: Diagnosis not present

## 2020-12-06 ENCOUNTER — Other Ambulatory Visit: Payer: Self-pay | Admitting: *Deleted

## 2020-12-06 DIAGNOSIS — Z20822 Contact with and (suspected) exposure to covid-19: Secondary | ICD-10-CM | POA: Diagnosis not present

## 2020-12-06 NOTE — Patient Outreach (Signed)
[facility]) Care Management  12/06/2020  [name] May 19, 1931 [id number]   RN Health Coach attempted follow up outreach call to patient.  Patient was unavailable. HIPPA compliance voicemail message left with return callback number.  Plan: Unsuccessful outreach letter sent RN will call patient again within 30 days.  [place] Care Management [phone]

## 2020-12-16 DIAGNOSIS — I10 Essential (primary) hypertension: Secondary | ICD-10-CM | POA: Diagnosis not present

## 2020-12-25 DIAGNOSIS — I272 Pulmonary hypertension, unspecified: Secondary | ICD-10-CM | POA: Diagnosis not present

## 2020-12-25 DIAGNOSIS — R058 Other specified cough: Secondary | ICD-10-CM | POA: Diagnosis not present

## 2020-12-25 DIAGNOSIS — Z9981 Dependence on supplemental oxygen: Secondary | ICD-10-CM | POA: Diagnosis not present

## 2020-12-25 DIAGNOSIS — I517 Cardiomegaly: Secondary | ICD-10-CM | POA: Diagnosis not present

## 2020-12-25 DIAGNOSIS — R0602 Shortness of breath: Secondary | ICD-10-CM | POA: Diagnosis not present

## 2020-12-30 ENCOUNTER — Other Ambulatory Visit: Payer: Self-pay | Admitting: Cardiology

## 2020-12-30 DIAGNOSIS — M79675 Pain in left toe(s): Secondary | ICD-10-CM | POA: Diagnosis not present

## 2020-12-30 DIAGNOSIS — E1151 Type 2 diabetes mellitus with diabetic peripheral angiopathy without gangrene: Secondary | ICD-10-CM | POA: Diagnosis not present

## 2020-12-30 DIAGNOSIS — B351 Tinea unguium: Secondary | ICD-10-CM | POA: Diagnosis not present

## 2020-12-30 DIAGNOSIS — M79674 Pain in right toe(s): Secondary | ICD-10-CM | POA: Diagnosis not present

## 2020-12-30 DIAGNOSIS — I10 Essential (primary) hypertension: Secondary | ICD-10-CM

## 2021-01-03 ENCOUNTER — Ambulatory Visit: Payer: Self-pay | Admitting: *Deleted

## 2021-01-12 ENCOUNTER — Encounter: Payer: Self-pay | Admitting: Pharmacist

## 2021-01-12 NOTE — Progress Notes (Signed)
CARE PLAN ENTRY  01/12/2021 Name: [name] MRN: [id number] DOB: 1931/02/01  [name] is enrolled in Remote Patient Monitoring/Principle Care Monitoring.  Date of Enrollment: 02/08/20 Supervising physician: Adrian Prows Indication: HTN  Remote Readings: {Compliant and Avg BP: 120/55, HR:63  Pharmacist Clinical Goal(s):  Marland Kitchen Over the next 90 days, patient will demonstrate Improved medication adherence as evidenced by medication fill history . Over the next 90 days, patient will work with PharmD to address needs related to hypertension management . Over the next 90 days, patient will experience decrease in ED visits. ED visits in last 6 months = 0 . Over the next 90 days, patient will not experience hospital admission. Hospital Admissions in last 6 months = 1  Interventions: . Provider and Inter-disciplinary care team collaboration (see longitudinal plan of care) . Comprehensive medication review performed. Marland Kitchen Collaboration with health plan regarding medication benefit . Collaboration with provider re: medication management  Patient Active Problem List   Diagnosis Date Noted  . Gastroesophageal reflux disease 09/25/2020  . Globus sensation 04/20/2019  . Acute on chronic respiratory failure with hypoxia ([place])   . CAP (community acquired pneumonia) 07/19/2018  . Flatulence/wind 07/06/2018  . Pulmonary hypertension ([place]) 04/25/2017  . Dysphagia 07/30/2016  . Hypothyroid 12/16/2015  . Cirrhosis of liver ([place]) 07/17/2015  . Congestive heart disease ([place])   . Type 2 diabetes mellitus without complication ([place])   . Diastolic CHF, chronic ([place]) 10/25/2014  . Bloating 06/27/2014  . SVT (supraventricular tachycardia) ([place]) 04/17/2014  . Pressure ulcer, heel, right, unstageable ([place]) 04/17/2014  . Anemia 04/17/2014  . Fracture of femoral neck, right, closed ([place]) 03/28/2014  . Hip fracture ([place]) 03/21/2014  . Acute diastolic congestive heart failure ([place]) 03/21/2014  . Abnormal  carotid duplex scan 02/20/2014  . Orthostatic hypotension 02/20/2014  . Right sided weakness 02/16/2014  . Loss of weight 06/23/2013  . Primary biliary cirrhosis ([place]) 06/12/2013  . Leg weakness, bilateral 06/12/2013  . PSVT (paroxysmal supraventricular tachycardia) ([place]) 06/12/2013  . Chronic cough 11/28/2011  . Dyspnea 10/21/2011  . H/O tobacco use, presenting hazards to health 10/21/2011  . Coronary artery disease involving native coronary artery of native heart with angina pectoris ([place]) 04/15/2010  . Type 2 diabetes mellitus ([place]) 07/30/2008  . TREMOR, LEFT HAND 05/07/2008  . SCAR CONDITION AND FIBROSIS OF SKIN 03/05/2008  . Hyperlipidemia 10/11/2006  . Anxiety state 10/11/2006  . Depression 10/11/2006  . Essential hypertension 10/11/2006  . OSTEOPENIA 10/11/2006  . NEOPLASM, MALIGNANT, BREAST, HX OF 10/11/2006   Past Surgical History:  Procedure Laterality Date  . ABDOMINAL HYSTERECTOMY    . APPENDECTOMY    . CATARACT EXTRACTION Bilateral    Dr. Gershon Crane  . CHOLECYSTECTOMY  2008  . COLONOSCOPY  2010   Dr. Oneida Alar: single tubular adenoma, one hyperplastic polyp. Next TCS 2020 health permitting.  . ESOPHAGOGASTRODUODENOSCOPY N/A 08/17/2016   web in upper third of esophagus s/p dilation, five gastric ulcers without bleeding  . ESOPHAGOGASTRODUODENOSCOPY N/A 01/01/2017    normal esophagus, gastritis  . HEMORRHOID SURGERY  1960s  . HIP ARTHROPLASTY Right 03/28/2014   Procedure: ARTHROPLASTY BIPOLAR HIP;  Surgeon: Carole Civil, MD;  Location: [facility];  Service: Orthopedics;  Laterality: Right;  . LAPAROSCOPIC APPENDECTOMY N/A 04/13/2013   Procedure: APPENDECTOMY LAPAROSCOPIC;  Surgeon: Donato Heinz, MD;  Location: [facility];  Service: General;  Laterality: N/A;  . MASTECTOMY  1994   Left breast -related to breast cancer  . RIGHT HEART CATH N/A  04/29/2017   Procedure: Right Heart Cath;  Surgeon: Adrian Prows, MD;  Location: [facility];  Service: Cardiovascular;   Laterality: N/A;  . RIGHT/LEFT HEART CATH AND CORONARY ANGIOGRAPHY N/A 04/13/2017   Procedure: RIGHT/LEFT HEART CATH AND CORONARY ANGIOGRAPHY;  Surgeon: Adrian Prows, MD;  Location: [facility];  Service: Cardiovascular;  Laterality: N/A;  . SAVORY DILATION N/A 08/17/2016   Procedure: SAVORY DILATION;  Surgeon: Danie Binder, MD;  Location: [facility];  Service: Endoscopy;  Laterality: N/A;  . VESICOVAGINAL FISTULA CLOSURE W/ TAH  1979  . YAG LASER APPLICATION Left    Dr. Zadie Rhine   Social History   Socioeconomic History  . Marital status: Divorced    Spouse name: Not on file  . Number of children: 4  . Years of education: Not on file  . Highest education level: Not on file  Occupational History  . Occupation: RETIRED FACTORY WORKER    Comment: [employer]  Tobacco Use  . Smoking status: Former Smoker    Packs/day: 0.50    Years: 40.00    Pack years: 20.00    Types: Cigarettes    Quit date: 03/30/2013    Years since quitting: 7.7  . Smokeless tobacco: Never Used  . Tobacco comment: Quit since 03/2013  Vaping Use  . Vaping Use: Never used  Substance and Sexual Activity  . Alcohol use: No    Alcohol/week: 0.0 standard drinks  . Drug use: No  . Sexual activity: Never  Other Topics Concern  . 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 Started age 18.   Drug use -no   Alcohol use - no   Lives with son   Education - 12 th grade                  Social Determinants of Health   Financial Resource Strain: Not on file  Food Insecurity: No Food Insecurity  . Worried About Charity fundraiser in the Last Year: Never true  . Ran Out of Food in the Last Year: Never true  Transportation Needs: No Transportation Needs  . Lack of Transportation (Medical): No  . Lack of  Transportation (Non-Medical): No  Physical Activity: Not on file  Stress: Not on file  Social Connections: Not on file   Family History  Problem Relation Age of Onset  . Heart failure Mother   . Kidney cancer Mother   . Cancer Mother   . Hypertension Mother   . Lung cancer Father        Brain METS  . Cancer Father   . Cirrhosis Brother   . Kidney cancer Sister   . Cancer Sister   . Hypertension Sister   . Diabetes Son   . Heart attack Neg Hx   . Stroke Neg Hx    Allergies  Allergen Reactions  . Aricept [Donepezil Hcl]     Hives, vomiting and headache  . Namenda [Memantine Hcl]     hives  . Adcirca [Tadalafil] Other (See Comments)    Caused blindness in one eye  . Donepezil Nausea Only  . Spironolactone Other (See Comments)    Dizziness, balance problems  . Codeine Rash  . Indocin [Indomethacin] Rash  . Latex Rash  .  Penicillins Rash   Outpatient Encounter Medications as of 01/12/2021  Medication Sig  . acetaminophen (TYLENOL) 500 MG tablet Take 500 mg by mouth every 8 (eight) hours as needed for mild pain or moderate pain.  Marland Kitchen ALPRAZolam (XANAX) 0.5 MG tablet Take 0.5 mg by mouth 2 (two) times daily as needed for anxiety.   Marland Kitchen atorvastatin (LIPITOR) 10 MG tablet Take 1 tablet (10 mg total) by mouth daily.  Marland Kitchen BENICAR 5 MG tablet TAKE 1 TABLET DAILY  . cholecalciferol (VITAMIN D) 1000 UNITS tablet Take 5,000 Units by mouth every morning.   . citalopram (CELEXA) 20 MG tablet Take 1 tablet by mouth daily.  . clopidogrel (PLAVIX) 75 MG tablet Take 75 mg by mouth daily.  . clotrimazole-betamethasone (LOTRISONE) cream Apply 1 application topically 2 (two) times daily as needed (Eczema).   . docusate sodium (COLACE) 100 MG capsule Take 100 mg by mouth 2 (two) times daily.  . feeding supplement, GLUCERNA SHAKE, (GLUCERNA SHAKE) LIQD Take 237 mLs by mouth daily as needed.   Marland Kitchen FREESTYLE LITE test strip 1 each 3 (three) times daily.  . furosemide (LASIX) 40 MG tablet Take 1.5  tablets (60 mg total) by mouth daily.  . hydrALAZINE (APRESOLINE) 10 MG tablet TAKE 1 TABLET IN THE MORNING AND AT BEDTIME  . insulin aspart (NOVOLOG FLEXPEN) 100 UNIT/ML FlexPen Inject 5 Units into the skin 3 (three) times daily with meals. When BG is over 100   . Insulin Glargine (LANTUS SOLOSTAR) 100 UNIT/ML Solostar Pen Inject 8 Units into the skin at bedtime.   Marland Kitchen loratadine (CLARITIN) 10 MG tablet Take 10 mg by mouth daily.  . macitentan (OPSUMIT) 10 MG tablet Take 1 tablet (10 mg total) by mouth daily.  . metoprolol (LOPRESSOR) 50 MG tablet Take 35m in the a.m.and 241min the pm (Patient taking differently: Take 12.5 mg by mouth 2 (two) times daily. )  . mirtazapine (REMERON) 30 MG tablet Take 30 mg by mouth at bedtime.  . Multiple Vitamin (MULTIVITAMIN) tablet Take 1 tablet by mouth daily. Reported on 02/06/2016  . pantoprazole (PROTONIX) 40 MG tablet TAKE 1 TABLET TWICE A DAY BEFORE MEALS  . Polyethyl Glycol-Propyl Glycol (SYSTANE OP) Apply to eye as needed.  . potassium chloride (KLOR-CON) 10 MEQ tablet TAKE 1 TABLET   DAILY AS DIRECTED WITH FUROSEMIDE  . psyllium (METAMUCIL SMOOTH TEXTURE) 28 % packet Take 1 packet by mouth as needed.   . thyroid (ARMOUR) 15 MG tablet Take 15 mg by mouth daily.  . Marland Kitchenmeclidinium bromide (INCRUSE ELLIPTA) 62.5 MCG/INH AEPB Inhale 1 puff into the lungs daily.  . ursodiol (ACTIGALL) 500 MG tablet TAKE 1 TABLET TWICE A DAY WITH MEALS   No facility-administered encounter medications on file as of 01/12/2021.   Patient Care Team    Relationship Specialty Notifications Start End  Michele GravelMD PCP - General Internal Medicine  12/01/13    Comment: Michele MaduraSaMarga MelnickMD (Inactive) Consulting Physician Gastroenterology  06/27/14   [name], [name] TrSavonburganagement  Admissions 09/21/19   [name] Consulting Physician Internal Medicine  09/17/20     Current Diagnosis/Assessment: Goals Addressed   None     Hypertension   BP today is:  <130/80  Office blood pressures are  BP Readings from Last 3 Encounters:  09/25/20 (!) 177/72  07/25/20 (!) 151/74  07/09/20 (!) 149/62    Patient currently taking: benicar 5 mg, lasix 60 mg, hydralazine 10  mg BID, metoprolol 50 mg AM and 25 mg PM.  Patient checks BP at home daily  Patient home BP readings are ranging: 99-155/42-76  We discussed diet and exercise extensively  Plan  Continue current medications and control with diet and exercise     ______________ Visit Information SDOH (Social Determinants of Health) assessments performed: Yes.  Ms. Sak was given information about Principle Care Management/Remote Patient Monitoring services today including:  1. RPM/PCM service includes personalized support from designated clinical staff supervised by her physician, including individualized plan of care and coordination with other care providers 2. 24/7 contact phone numbers for assistance for urgent and routine care needs. 3. Standard insurance, coinsurance, copays and deductibles apply for principle care management only during months in which we provide at least 30 minutes of these services. Most insurances cover these services at 100%, however patients may be responsible for any copay, coinsurance and/or deductible if applicable. This service may help you avoid the need for more expensive face-to-face services. 4. Only one practitioner may furnish and bill the service in a calendar month. 5. The patient may stop PCM/RPM services at any time (effective at the end of the month) by phone call to the office staff.  Patient agreed to services and verbal consent obtained.   Manuela Schwartz, Pharm.D. [facility] [phone]

## 2021-01-15 ENCOUNTER — Other Ambulatory Visit: Payer: Self-pay | Admitting: *Deleted

## 2021-01-15 NOTE — Patient Outreach (Signed)
[facility]) Care Management  01/15/2021  JENA TEGELER [birth year] [phone]   RN Health Coach case closure. RN had tried multiple times to outreach patient with no return call. RN sent unsuccessful outreach letter with no return response.  Plan: Case closure RN sent case closure to patient RN sent case closure to PCP  .[facility] Management [phone]

## 2021-01-16 ENCOUNTER — Other Ambulatory Visit: Payer: Self-pay

## 2021-01-16 DIAGNOSIS — I471 Supraventricular tachycardia: Secondary | ICD-10-CM

## 2021-01-16 DIAGNOSIS — I10 Essential (primary) hypertension: Secondary | ICD-10-CM | POA: Diagnosis not present

## 2021-01-16 MED ORDER — MACITENTAN 10 MG PO TABS: 10.0000 mg | ORAL_TABLET | Freq: Every day | ORAL | 0 refills | Status: DC

## 2021-01-20 ENCOUNTER — Other Ambulatory Visit: Payer: Self-pay

## 2021-01-20 DIAGNOSIS — I471 Supraventricular tachycardia, unspecified: Secondary | ICD-10-CM

## 2021-01-20 MED ORDER — MACITENTAN 10 MG PO TABS: 10.0000 mg | ORAL_TABLET | Freq: Every day | ORAL | 0 refills | Status: DC

## 2021-01-21 DIAGNOSIS — I10 Essential (primary) hypertension: Secondary | ICD-10-CM | POA: Diagnosis not present

## 2021-01-21 DIAGNOSIS — Z66 Do not resuscitate: Secondary | ICD-10-CM | POA: Diagnosis not present

## 2021-01-21 DIAGNOSIS — I5032 Chronic diastolic (congestive) heart failure: Secondary | ICD-10-CM | POA: Diagnosis not present

## 2021-01-21 DIAGNOSIS — I270 Primary pulmonary hypertension: Secondary | ICD-10-CM | POA: Diagnosis not present

## 2021-01-21 DIAGNOSIS — J9621 Acute and chronic respiratory failure with hypoxia: Secondary | ICD-10-CM | POA: Diagnosis not present

## 2021-01-21 DIAGNOSIS — K7469 Other cirrhosis of liver: Secondary | ICD-10-CM | POA: Diagnosis not present

## 2021-01-22 ENCOUNTER — Encounter: Payer: Self-pay | Admitting: Cardiology

## 2021-01-22 ENCOUNTER — Ambulatory Visit: Payer: Medicare Other | Admitting: Cardiology

## 2021-01-22 ENCOUNTER — Other Ambulatory Visit: Payer: Self-pay

## 2021-01-22 VITALS — BP 156/60 | HR 64 | Temp 97.7°F | Resp 17 | Ht 64.0 in | Wt 150.4 lb

## 2021-01-22 DIAGNOSIS — K7469 Other cirrhosis of liver: Secondary | ICD-10-CM

## 2021-01-22 DIAGNOSIS — I5032 Chronic diastolic (congestive) heart failure: Secondary | ICD-10-CM

## 2021-01-22 DIAGNOSIS — Z66 Do not resuscitate: Secondary | ICD-10-CM

## 2021-01-22 DIAGNOSIS — I270 Primary pulmonary hypertension: Secondary | ICD-10-CM

## 2021-01-22 DIAGNOSIS — I10 Essential (primary) hypertension: Secondary | ICD-10-CM

## 2021-01-22 NOTE — Progress Notes (Signed)
Primary Physician/Referring:  [name], MD  Patient ID: [name], female    DOB: 1930-12-23, 87 y.o.   MRN: [id number]  Chief Complaint  Patient presents with  . Follow-up    6 MONTH  . PULMONARY HYPERTENSION   HPI: [name]  is a 87 y.o. female  with primary pulmonary hypertension, cryptogenic cirrhosis of the liver, presently on Opsumit since July 2018, did not tolerate Adcirca or Sildanafil due to visual side effects. She is also being followed at [facility] by Dr. Suzi Roots, last seen in July 2021 in November 2018 recommended continuing present medical therapy in view of advanced age and other underlying comorbidity difficult to delineate 6-minute walk test was appropriate.  Today patient is accompanied by daughter. States patient has been waling regularly at home. No recent hospital visits or ED visits. Denies leg edema, denies worsening dyspnea, no chest pain or palpitations.  Past Medical History:  Diagnosis Date  . Acute delirium 06/23/2013  . Acute diastolic congestive heart failure ([place]) 03/21/2014  . Anxiety   . Cancer ([place])   . Carotid artery disease (HCC)    Bilateral ICA occlusion  . Chronic headaches   . Chronic liver disease    ?cirrhosis on 10/2014 CT. H/O elevated alkphos and +AMA on URSO.  Marland Kitchen Coronary atherosclerosis of native coronary artery    Mild nonobstructive disease at cardiac catheterization May 2011  . Depression   . Essential hypertension, benign   . Fracture of femoral neck, right, closed ([place]) 03/28/2014  . History of breast cancer   . History of GI bleed   . Hyperlipidemia   . Orthostatic hypotension    Diabetic polyneuropathy/diabetic dysautonomia   . Osteopenia   . Parotid tumor   . Pneumonia 11/2016  . Pneumonia   . Pressure ulcer, heel, right, unstageable ([place]) 04/17/2014  . PSVT (paroxysmal supraventricular tachycardia) ([place])   . Syncope and collapse 04/15/2010   Qualifier: Diagnosis of  By: Angelena Form, MD, Harrell Gave    . Type  2 diabetes mellitus ([place])   . UPPER GASTROINTESTINAL HEMORRHAGE 03/21/2007   Qualifier: Diagnosis of  By: Jonna Munro MD, Roderic Scarce      Past Surgical History:  Procedure Laterality Date  . ABDOMINAL HYSTERECTOMY    . APPENDECTOMY    . CATARACT EXTRACTION Bilateral    Dr. Gershon Crane  . CHOLECYSTECTOMY  2008  . COLONOSCOPY  2010   Dr. Oneida Alar: single tubular adenoma, one hyperplastic polyp. Next TCS 2020 health permitting.  . ESOPHAGOGASTRODUODENOSCOPY N/A 08/17/2016   web in upper third of esophagus s/p dilation, five gastric ulcers without bleeding  . ESOPHAGOGASTRODUODENOSCOPY N/A 01/01/2017    normal esophagus, gastritis  . HEMORRHOID SURGERY  1960s  . HIP ARTHROPLASTY Right 03/28/2014   Procedure: ARTHROPLASTY BIPOLAR HIP;  Surgeon: Carole Civil, MD;  Location: [facility];  Service: Orthopedics;  Laterality: Right;  . LAPAROSCOPIC APPENDECTOMY N/A 04/13/2013   Procedure: APPENDECTOMY LAPAROSCOPIC;  Surgeon: Donato Heinz, MD;  Location: [facility];  Service: General;  Laterality: N/A;  . MASTECTOMY  1994   Left breast -related to breast cancer  . RIGHT HEART CATH N/A 04/29/2017   Procedure: Right Heart Cath;  Surgeon: Adrian Prows, MD;  Location: [place] [facility];  Service: Cardiovascular;  Laterality: N/A;  . RIGHT/LEFT HEART CATH AND CORONARY ANGIOGRAPHY N/A 04/13/2017   Procedure: RIGHT/LEFT HEART CATH AND CORONARY ANGIOGRAPHY;  Surgeon: Adrian Prows, MD;  Location: [place] [facility];  Service: Cardiovascular;  Laterality: N/A;  .  SAVORY DILATION N/A 08/17/2016   Procedure: SAVORY DILATION;  Surgeon: Danie Binder, MD;  Location: [facility];  Service: Endoscopy;  Laterality: N/A;  . VESICOVAGINAL FISTULA CLOSURE W/ TAH  1979  . YAG LASER APPLICATION Left    Dr. Zadie Rhine   Social History   Tobacco Use  . Smoking status: Former Smoker    Packs/day: 0.50    Years: 40.00    Pack years: 20.00    Types: Cigarettes    Quit date: 03/30/2013    Years since quitting: 7.8  . Smokeless  tobacco: Never Used  . Tobacco comment: Quit since 03/2013  Substance Use Topics  . Alcohol use: No    Alcohol/week: 0.0 standard drinks   Review of Systems  Eyes: Positive for blurred vision.  Cardiovascular: Positive for dyspnea on exertion. Negative for chest pain and leg swelling.  Musculoskeletal: Positive for arthritis and joint pain.  Gastrointestinal: Negative for melena.   Objective  Blood pressure (!) 156/60, pulse 64, temperature 97.7 F (36.5 C), temperature source Temporal, resp. rate 17, height _0  (1.626 m), weight 150 lb 6.4 oz (68.2 kg), SpO2 93 %. Body mass index is 25.82 kg/m. Vitals with BMI 01/22/2021 09/25/2020 07/25/2020  Height _1  _2  _3   Weight 150 lbs 6 oz 146 lbs 147 lbs  BMI 25.8 58.85 02.77  Systolic 412 878 676  Diastolic 60 72 74  Pulse 64 74 72   Physical Exam Constitutional:      General: She is not in acute distress.    Appearance: She is well-developed.  Cardiovascular:     Rate and Rhythm: Normal rate and regular rhythm.     Pulses: Intact distal pulses.     Heart sounds: Murmur heard.   Blowing midsystolic murmur is present with a grade of 2/6 at the lower left sternal border. No gallop.      Comments: Normal S2, loud S2.  No leg edema. No JVD Pulmonary:     Effort: Pulmonary effort is normal.     Breath sounds: Examination of the left-middle field reveals rales. Examination of the left-lower field reveals rhonchi and rales. Rhonchi and rales present.  Abdominal:     General: Bowel sounds are normal.     Palpations: Abdomen is soft.  Musculoskeletal:     Cervical back: Neck supple.    Radiology: No results found.  Laboratory examination:   CMP Latest Ref Rng & Units 07/09/2020 05/08/2019 12/26/2018  Glucose 70 - 99 mg/dL 184(H) - 167(H)  BUN 8 - 23 mg/dL 22 - 17  Creatinine 0.44 - 1.00 mg/dL 0.81 0.80 0.81  Sodium 135 - 145 mmol/L 134(L) - 135  Potassium 3.5 - 5.1 mmol/L 3.6 - 4.2  Chloride 98 - 111 mmol/L 98 - 96(L)   CO2 22 - 32 mmol/L 26 - 33(H)  Calcium 8.9 - 10.3 mg/dL 8.9 - 9.3  Total Protein 6.5 - 8.1 g/dL 7.6 - 7.4  Total Bilirubin 0.3 - 1.2 mg/dL 0.4 - 0.5  Alkaline Phos 38 - 126 U/L 85 - -  AST 15 - 41 U/L 22 - 19  ALT 0 - 44 U/L 15 - 10   CBC Latest Ref Rng & Units 07/09/2020 12/26/2018 07/20/2018  WBC 4.0 - 10.5 K/uL 4.5 4.1 4.0  Hemoglobin 12.0 - 15.0 g/dL 11.6(L) 12.1 11.2(L)  Hematocrit 36.0 - 46.0 % 35.9(L) 35.7 34.0(L)  Platelets 150 - 400 K/uL 182 219 196   External labs:   Labs 12/25/2020:  Hb 12.2/HCT 35.5, WBC 3.5, platelets 168.  Sodium 143, potassium 4.3, BUN 22, creatinine 0.8, EGFR >80 mL.  AST, ALT and alkaline phosphatase normal.  CMP normal.  Labs 08/26/2020:  Total cholesterol 162, triglycerides 92, HDL 68, LDL 76.  A1c 6.9%.  TSH 1.69.  Hemoglobin 10.400 02/03/2020; Platelets 188.000 02/03/2020  Creatinine, Serum 1.030 02/03/2020 Potassium 3.900 02/03/2020 ALT (SGPT) 26.000 02/02/2020  TSH 1.489 01/09/2020  Labs 06/23/2013: ANA positive. Hepatitis B and C negative. Negative for anti-smooth muscle antibody, mitochondrial antibody.   Current Outpatient Medications on File Prior to Visit  Medication Sig Dispense Refill  . acetaminophen (TYLENOL) 500 MG tablet Take 500 mg by mouth every 8 (eight) hours as needed for mild pain or moderate pain.    Marland Kitchen ALPRAZolam (XANAX) 0.5 MG tablet Take 0.5 mg by mouth 2 (two) times daily as needed for anxiety.     Marland Kitchen atorvastatin (LIPITOR) 10 MG tablet Take 1 tablet (10 mg total) by mouth daily. 30 tablet 0  . BENICAR 5 MG tablet TAKE 1 TABLET DAILY 90 tablet 3  . cholecalciferol (VITAMIN D) 1000 UNITS tablet Take 2,000 Units by mouth every morning.    . citalopram (CELEXA) 20 MG tablet Take 1 tablet by mouth daily.    . clopidogrel (PLAVIX) 75 MG tablet Take 75 mg by mouth daily.    . clotrimazole-betamethasone (LOTRISONE) cream Apply 1 application topically 2 (two) times daily as needed (Eczema).     Marland Kitchen dextromethorphan-guaiFENesin  (MUCINEX DM) 30-600 MG 12hr tablet Take 1 tablet by mouth 2 (two) times daily.    Marland Kitchen docusate sodium (COLACE) 100 MG capsule Take 100 mg by mouth 2 (two) times daily.    . feeding supplement, GLUCERNA SHAKE, (GLUCERNA SHAKE) LIQD Take 237 mLs by mouth daily as needed.     Marland Kitchen FREESTYLE LITE test strip 1 each 3 (three) times daily.    . furosemide (LASIX) 40 MG tablet Take 1.5 tablets (60 mg total) by mouth daily. 135 tablet 3  . hydrALAZINE (APRESOLINE) 10 MG tablet TAKE 1 TABLET IN THE MORNING AND AT BEDTIME 180 tablet 3  . insulin aspart (NOVOLOG) 100 UNIT/ML FlexPen Inject 5 Units into the skin 3 (three) times daily with meals. When BG is over 100    . insulin glargine (LANTUS) 100 UNIT/ML Solostar Pen Inject 8 Units into the skin at bedtime.     Marland Kitchen L-Methylfolate-B12-B6-B2 (CEREFOLIN PO) Take 1 tablet by mouth daily.    Marland Kitchen loratadine (CLARITIN) 10 MG tablet Take 10 mg by mouth daily.    . macitentan (OPSUMIT) 10 MG tablet Take 1 tablet (10 mg total) by mouth daily. 90 tablet 0  . mirtazapine (REMERON) 30 MG tablet Take 30 mg by mouth at bedtime.    . Multiple Vitamin (MULTIVITAMIN) tablet Take 1 tablet by mouth daily. Reported on 02/06/2016    . pantoprazole (PROTONIX) 40 MG tablet TAKE 1 TABLET TWICE A DAY BEFORE MEALS 180 tablet 3  . Polyethyl Glycol-Propyl Glycol (SYSTANE OP) Apply to eye as needed.    . psyllium (METAMUCIL SMOOTH TEXTURE) 28 % packet Take 1 packet by mouth as needed.     . sodium chloride (OCEAN) 0.65 % SOLN nasal spray Place 1 spray into both nostrils as needed for congestion.    Marland Kitchen thyroid (ARMOUR) 15 MG tablet Take 15 mg by mouth daily.    Marland Kitchen umeclidinium bromide (INCRUSE ELLIPTA) 62.5 MCG/INH AEPB Inhale 1 puff into the lungs daily.    . ursodiol (ACTIGALL) 500  MG tablet TAKE 1 TABLET TWICE A DAY WITH MEALS 180 tablet 3  . metoprolol (LOPRESSOR) 50 MG tablet Take 53m in the a.m.and 258min the pm (Patient taking differently: Take 12.5 mg by mouth 2 (two) times daily.)    .  potassium chloride (KLOR-CON) 10 MEQ tablet Take 1 tablet by mouth daily.     No current facility-administered medications on file prior to visit.    Cardiac Studies:   Carotid artery duplex 0405/17/2018Bilateral internal carotid artery flow appears to be sluggish and may suggest complete occlusion. Left vertebral artery flow is undetectable (no flow). Antegrade right vertebral artery flow. Consider different modality of imaging to better define anatomy.  Right Heart Cath 04/29/2017, RA pressure 8/2, mean 2 mmHg. RV 78/1, EDP 3 mmHg. PA 80/18, mean 42 mmHg. PW 13/14, mean 11 mmHg. PA saturation was 73%, aortic saturation 98%. Cardiac output 4.88, cardiac index 2.85 by Fick.  Echocardiogram 01/03/2020:  1. Normal LV systolic function with visual EF 60-65%. Left ventricle  cavity is normal in size. Normal left ventricular wall thickness. Normal  global wall motion. Doppler evidence of grade II (pseudonormal) diastolic  dysfunction, elevated LAP. Calculated EF 56%.  2. Left atrial cavity is moderate to severely dilated.  3. Right atrial cavity is moderately dilated.  4. Mild (Grade I) mitral regurgitation.  5. Normal RV size and function.  Moderate tricuspid regurgitation. Moderate pulmonary hypertension. RVSP  measures 54 mmHg.  6. Moderate pulmonic regurgitation.  7. IVC is normal with blunted respiratory response.  8. Prior study dated Echocardiogram 10/21/2018, no significant change.   EKG:    EKG 01/22/2021: Sinus rhythm with first-degree AV block at rate of 62 bpm, P pulmonale, normal axis.  No evidence of ischemia, normal QT interval.No significant change from  EKG 02/08/2020.     SIX MIN WALK 01/22/2021 07/25/2020 05/24/2020 04/25/2019 04/25/2019  Medications none - - - -  Supplimental Oxygen during Test? (L/min) Yes Yes Yes Yes -  O2 Flow Rate _0 - -  Type - Continuous Continuous Continuous -  Laps - - - 6 6  Laps _1 - -  Partial Lap (in Meters) 1 0 0 0 -  Baseline Heartrate  81 68 73 69 -  Baseline SPO2 98 100 94 94 -  2 Minute Oxygen Saturation % 95 99 93 - -  2 Minute HR 97 103 86 - -  4 Minute Oxygen Saturation % 95 94 92 - -  4 Minute HR 116 114 92 - -  6 Minute Oxygen Saturation % 95 93 93 - -  6 Minute HR 124 119 92 - -  Heartrate - 118 - 102 -  SPO2 - 92 - 91 -  Heartrate 84 - - 83 -  SPO2 95 - - 92 -  Stopped or Paused before Six Minutes No No No No -  Interpretation - - Hip pain - -  Distance Completed - - - 204 -  Distance Completed 221 120 100 - -  Tech Comments: Patient walked all the way through the 6 minutes without stopping. Daughter stated she has been practicing her 6 minute walk at home. Patient did not take a break during the walk. Patient did not stop at all, but walked a a very slow pace. pt walked 6 laps in 6 minutes -  Total distance 120 meters.   Assessment     ICD-10-CM   1. Primary pulmonary hypertension (HCC)  I27.0 EKG 12-Lead  6 minute walk  2. Diastolic CHF, chronic (HCC)  I50.32   3. Cryptogenic cirrhosis of liver ([place])  K74.69   4. Essential hypertension  I10   5. DNR (do not resuscitate)  Z66     No orders of the defined types were placed in this encounter.  Medications Discontinued During This Encounter  Medication Reason  . potassium chloride (KLOR-CON) 10 MEQ tablet Dose change    Orders Placed This Encounter  Procedures  . EKG 12-Lead  . 6 minute walk    Standing Status:   Future    Standing Expiration Date:   01/22/2022    Order Specific Question:   Where should this test be performed?    Answer:   [facility]     Recommendations:   ERMINIE FOULKS  is a 87 y.o. AAF patient with primary pulmonary hypertension, cryptogenic cirrhosis of the liver, presently on Opsumit since July 2018, did not tolerate Adcirca or Sildanafil due to visual side effects. She is also being followed at [facility] by Dr. Suzi Roots, last seen in July 2021 in November 2018 recommended continuing present medical therapy in view of  advanced age and other underlying comorbidity difficult to delineate 6-minute walk test was appropriate.  She has history of breast cancer status post radical mastectomy and chemotherapy, well controlled diabetes mellitus, moderate coronary artery disease and bilateral carotid artery occlusion without stroke and is on chronic plavix.   She is remained stable, 6-minute walk test was evaluated, significant improvement compared to prior 6-minute walk test 6 months ago.  During that time she also had bad UTI.  Overall she has remained stable without clinical evidence of acute decompensated right-sided heart failure.  I had a long and lengthy discussion with the patient and her daughter [name] at the bedside regarding advance care planning.  In view of her advanced age, underlying severe medical comorbidity, after discussions, patient would like to be DNR.  However scope of therapy discussed and acute illnesses and management discussed with the patient and her daughter.  No changes in the medications were done today.  MOST form was filled today and DNR status placed in chart.  I spent 45 minutes with the patient including discussions regarding end-of-life issues.  External labs reviewed.  Hemoglobin and renal function has remained stable and liver enzymes are normal.  OV in 3 months.    Adrian Prows, MD, [facility] 01/22/2021, 10:18 PM Office: [phone]

## 2021-01-31 ENCOUNTER — Other Ambulatory Visit: Payer: Self-pay

## 2021-01-31 MED ORDER — POTASSIUM CHLORIDE CRYS ER 10 MEQ PO TBCR: 10.0000 meq | EXTENDED_RELEASE_TABLET | Freq: Every day | ORAL | 0 refills | Status: DC

## 2021-02-04 ENCOUNTER — Telehealth: Payer: Self-pay | Admitting: Internal Medicine

## 2021-02-04 NOTE — Telephone Encounter (Signed)
RECALL FOR ULTRASOUND 

## 2021-02-04 NOTE — Telephone Encounter (Signed)
Recall mailed

## 2021-02-14 ENCOUNTER — Telehealth: Payer: Self-pay | Admitting: Internal Medicine

## 2021-02-14 DIAGNOSIS — K7460 Unspecified cirrhosis of liver: Secondary | ICD-10-CM

## 2021-02-14 NOTE — Addendum Note (Signed)
Addended by: Zara Council C on: 02/14/2021 11:12 AM   Modules accepted: Orders

## 2021-02-14 NOTE — Telephone Encounter (Signed)
[place] abd RUQ scheduled for 04/07/21 at 9:30am, arrive at 9:15am. NPO after midnight prior to test.  Called and informed daughter [name]) of [place] appt. Letter mailed.

## 2021-02-14 NOTE — Telephone Encounter (Signed)
PATIENT RECEIVED LETTER TO SCHEDULE ULTRASOUND

## 2021-02-15 DIAGNOSIS — I10 Essential (primary) hypertension: Secondary | ICD-10-CM | POA: Diagnosis not present

## 2021-02-20 ENCOUNTER — Other Ambulatory Visit: Payer: Self-pay | Admitting: Gastroenterology

## 2021-03-05 DIAGNOSIS — I5030 Unspecified diastolic (congestive) heart failure: Secondary | ICD-10-CM | POA: Diagnosis not present

## 2021-03-05 DIAGNOSIS — I779 Disorder of arteries and arterioles, unspecified: Secondary | ICD-10-CM | POA: Diagnosis not present

## 2021-03-05 DIAGNOSIS — E039 Hypothyroidism, unspecified: Secondary | ICD-10-CM | POA: Diagnosis not present

## 2021-03-05 DIAGNOSIS — E1159 Type 2 diabetes mellitus with other circulatory complications: Secondary | ICD-10-CM | POA: Diagnosis not present

## 2021-03-10 DIAGNOSIS — M79675 Pain in left toe(s): Secondary | ICD-10-CM | POA: Diagnosis not present

## 2021-03-10 DIAGNOSIS — B351 Tinea unguium: Secondary | ICD-10-CM | POA: Diagnosis not present

## 2021-03-10 DIAGNOSIS — E1151 Type 2 diabetes mellitus with diabetic peripheral angiopathy without gangrene: Secondary | ICD-10-CM | POA: Diagnosis not present

## 2021-03-10 DIAGNOSIS — M79674 Pain in right toe(s): Secondary | ICD-10-CM | POA: Diagnosis not present

## 2021-03-12 DIAGNOSIS — E119 Type 2 diabetes mellitus without complications: Secondary | ICD-10-CM | POA: Diagnosis not present

## 2021-03-12 DIAGNOSIS — I10 Essential (primary) hypertension: Secondary | ICD-10-CM | POA: Diagnosis not present

## 2021-03-12 DIAGNOSIS — L309 Dermatitis, unspecified: Secondary | ICD-10-CM | POA: Diagnosis not present

## 2021-03-12 DIAGNOSIS — Z8639 Personal history of other endocrine, nutritional and metabolic disease: Secondary | ICD-10-CM | POA: Diagnosis not present

## 2021-03-17 DIAGNOSIS — I10 Essential (primary) hypertension: Secondary | ICD-10-CM | POA: Diagnosis not present

## 2021-03-25 ENCOUNTER — Ambulatory Visit: Payer: Medicare Other | Admitting: Gastroenterology

## 2021-03-26 ENCOUNTER — Other Ambulatory Visit: Payer: Self-pay | Admitting: Family Medicine

## 2021-03-26 DIAGNOSIS — N644 Mastodynia: Secondary | ICD-10-CM

## 2021-04-01 ENCOUNTER — Other Ambulatory Visit (HOSPITAL_COMMUNITY): Payer: Self-pay | Admitting: Family Medicine

## 2021-04-02 ENCOUNTER — Telehealth: Payer: Self-pay | Admitting: *Deleted

## 2021-04-02 ENCOUNTER — Encounter: Payer: Self-pay | Admitting: Gastroenterology

## 2021-04-02 ENCOUNTER — Ambulatory Visit (INDEPENDENT_AMBULATORY_CARE_PROVIDER_SITE_OTHER): Payer: Medicare Other | Admitting: Gastroenterology

## 2021-04-02 ENCOUNTER — Other Ambulatory Visit: Payer: Self-pay

## 2021-04-02 VITALS — BP 157/63 | HR 65 | Temp 97.1°F | Ht 64.0 in | Wt 149.0 lb

## 2021-04-02 DIAGNOSIS — R1013 Epigastric pain: Secondary | ICD-10-CM

## 2021-04-02 DIAGNOSIS — K743 Primary biliary cirrhosis: Secondary | ICD-10-CM

## 2021-04-02 DIAGNOSIS — R1310 Dysphagia, unspecified: Secondary | ICD-10-CM

## 2021-04-02 DIAGNOSIS — K7469 Other cirrhosis of liver: Secondary | ICD-10-CM

## 2021-04-02 NOTE — Telephone Encounter (Signed)
Pt already scheduled for [place] 04/07/21 from recall. Anna aware. Called and informed pt [name] is now aware pt was already scheduled for [place].

## 2021-04-02 NOTE — Patient Instructions (Signed)
We will arrange an upper endoscopy with dilation if needed by Dr. Abbey Chatters.  We will request to stop Plavix for 5 days before.   Please have blood work done.  We are also ordering a routine ultrasound.  We will see you in 6 months!  I enjoyed seeing you again today! As you know, I value our relationship and want to provide genuine, compassionate, and quality care. I welcome your feedback. If you receive a survey regarding your visit,  I greatly appreciate you taking time to fill this out. See you next time!  Annitta Needs, PhD, ANP-BC [facility]

## 2021-04-02 NOTE — Progress Notes (Addendum)
Referring Provider: [name], MD Primary Care Physician:  Denyce Robert, FNP Primary GI: Dr. Abbey Chatters  Chief Complaint  Patient presents with  . Cirrhosis    Doing ok  . Gastroesophageal Reflux    Ok, swallowing ok    HPI:   [name] is an 87 y.o. female presenting today with a history of PBC, cirrhosis, GERD, dysphagia. Last EGD in Feb 2018 with normal esophagus and gastritis. History of PUD in 2017 due to NSAIDs. Mildly elevated AFP historically, with MRI in 2018 negative for hematoma. Chambers updated AFP. Due for updated [place].    Epigastric pain occasionally but unable to pinpoint what triggers. Appetite is good. Vague dysphagia in the past but none currently. Protonix BID. Good appetite. No confusion. No overt GI bleeding. Daughter present with her.   Past Medical History:  Diagnosis Date  . Acute delirium 06/23/2013  . Acute diastolic congestive heart failure ([place]) 03/21/2014  . Anxiety   . Cancer ([place])   . Carotid artery disease ([facility])    Bilateral ICA occlusion  . Chronic headaches   . Chronic liver disease    ?cirrhosis on 10/2014 CT. H/O elevated alkphos and +AMA on URSO.  Marland Kitchen Coronary atherosclerosis of native coronary artery    Mild nonobstructive disease at cardiac catheterization May 2011  . Depression   . Essential hypertension, benign   . Fracture of femoral neck, right, closed ([place]) 03/28/2014  . History of breast cancer   . History of GI bleed   . Hyperlipidemia   . Orthostatic hypotension    Diabetic polyneuropathy/diabetic dysautonomia   . Osteopenia   . Parotid tumor   . Pneumonia 11/2016  . Pneumonia   . Pressure ulcer, heel, right, unstageable ([place]) 04/17/2014  . PSVT (paroxysmal supraventricular tachycardia) ([place])   . Syncope and collapse 04/15/2010   Qualifier: Diagnosis of  By: Angelena Form, MD, Harrell Gave    . Type 2 diabetes mellitus ([place])   . UPPER GASTROINTESTINAL HEMORRHAGE 03/21/2007   Qualifier: Diagnosis of  By: Jonna Munro MD, Roderic Scarce       Past Surgical History:  Procedure Laterality Date  . ABDOMINAL HYSTERECTOMY    . APPENDECTOMY    . CATARACT EXTRACTION Bilateral    Dr. Gershon Crane  . CHOLECYSTECTOMY  2008  . COLONOSCOPY  2010   Dr. Oneida Alar: single tubular adenoma, one hyperplastic polyp. Next TCS 2020 health permitting.  . ESOPHAGOGASTRODUODENOSCOPY N/A 08/17/2016   web in upper third of esophagus s/p dilation, five gastric ulcers without bleeding  . ESOPHAGOGASTRODUODENOSCOPY N/A 01/01/2017    normal esophagus, gastritis  . HEMORRHOID SURGERY  1960s  . HIP ARTHROPLASTY Right 03/28/2014   Procedure: ARTHROPLASTY BIPOLAR HIP;  Surgeon: Carole Civil, MD;  Location: [facility];  Service: Orthopedics;  Laterality: Right;  . LAPAROSCOPIC APPENDECTOMY N/A 04/13/2013   Procedure: APPENDECTOMY LAPAROSCOPIC;  Surgeon: Donato Heinz, MD;  Location: [facility];  Service: General;  Laterality: N/A;  . MASTECTOMY  1994   Left breast -related to breast cancer  . RIGHT HEART CATH N/A 04/29/2017   Procedure: Right Heart Cath;  Surgeon: Adrian Prows, MD;  Location: [facility];  Service: Cardiovascular;  Laterality: N/A;  . RIGHT/LEFT HEART CATH AND CORONARY ANGIOGRAPHY N/A 04/13/2017   Procedure: RIGHT/LEFT HEART CATH AND CORONARY ANGIOGRAPHY;  Surgeon: Adrian Prows, MD;  Location: [facility];  Service: Cardiovascular;  Laterality: N/A;  . SAVORY DILATION N/A 08/17/2016   Procedure: SAVORY DILATION;  Surgeon: Danie Binder, MD;  Location:  [facility];  Service: Endoscopy;  Laterality: N/A;  . VESICOVAGINAL FISTULA CLOSURE W/ TAH  1979  . YAG LASER APPLICATION Left    Dr. Zadie Rhine    Current Outpatient Medications  Medication Sig Dispense Refill  . acetaminophen (TYLENOL) 500 MG tablet Take 500 mg by mouth every 8 (eight) hours as needed for mild pain or moderate pain.    Marland Kitchen ALPRAZolam (XANAX) 0.5 MG tablet Take 0.5 mg by mouth 2 (two) times daily as needed for anxiety.     Marland Kitchen atorvastatin (LIPITOR) 10 MG tablet Take 1 tablet  (10 mg total) by mouth daily. 30 tablet 0  . BENICAR 5 MG tablet TAKE 1 TABLET DAILY 90 tablet 3  . cholecalciferol (VITAMIN D) 1000 UNITS tablet Take 2,000 Units by mouth every morning.    . citalopram (CELEXA) 20 MG tablet Take 1 tablet by mouth daily.    . clopidogrel (PLAVIX) 75 MG tablet Take 75 mg by mouth daily.    . clotrimazole-betamethasone (LOTRISONE) cream Apply 1 application topically 2 (two) times daily as needed (Eczema).     Marland Kitchen dextromethorphan-guaiFENesin (MUCINEX DM) 30-600 MG 12hr tablet Take 1 tablet by mouth 2 (two) times daily.    Marland Kitchen docusate sodium (COLACE) 100 MG capsule Take 100 mg by mouth 2 (two) times daily.    . feeding supplement, GLUCERNA SHAKE, (GLUCERNA SHAKE) LIQD Take 237 mLs by mouth daily as needed.     Marland Kitchen FREESTYLE LITE test strip 1 each 3 (three) times daily.    . furosemide (LASIX) 40 MG tablet Take 1.5 tablets (60 mg total) by mouth daily. 135 tablet 3  . hydrALAZINE (APRESOLINE) 10 MG tablet TAKE 1 TABLET IN THE MORNING AND AT BEDTIME 180 tablet 3  . insulin aspart (NOVOLOG) 100 UNIT/ML FlexPen Inject 5 Units into the skin 3 (three) times daily with meals. When BG is over 100    . insulin glargine (LANTUS) 100 UNIT/ML Solostar Pen Inject 8 Units into the skin at bedtime.     Marland Kitchen L-Methylfolate-B12-B6-B2 (CEREFOLIN PO) Take 1 tablet by mouth daily.    Marland Kitchen loratadine (CLARITIN) 10 MG tablet Take 10 mg by mouth daily.    . macitentan (OPSUMIT) 10 MG tablet Take 1 tablet (10 mg total) by mouth daily. 90 tablet 0  . metoprolol (LOPRESSOR) 50 MG tablet Take 42m in the a.m.and 265min the pm (Patient taking differently: Take 12.5 mg by mouth 2 (two) times daily.)    . mirtazapine (REMERON) 30 MG tablet Take 30 mg by mouth at bedtime.    . Multiple Vitamin (MULTIVITAMIN) tablet Take 1 tablet by mouth daily. Reported on 02/06/2016    . pantoprazole (PROTONIX) 40 MG tablet TAKE 1 TABLET TWICE A DAY BEFORE MEALS 180 tablet 3  . Polyethyl Glycol-Propyl Glycol (SYSTANE OP)  Apply to eye as needed.    . potassium chloride (KLOR-CON) 10 MEQ tablet Take 1 tablet (10 mEq total) by mouth daily. 90 tablet 0  . psyllium (METAMUCIL SMOOTH TEXTURE) 28 % packet Take 1 packet by mouth as needed.     . sodium chloride (OCEAN) 0.65 % SOLN nasal spray Place 1 spray into both nostrils as needed for congestion.    . Marland Kitchenhyroid (ARMOUR) 15 MG tablet Take 15 mg by mouth daily.    . Marland Kitchenmeclidinium bromide (INCRUSE ELLIPTA) 62.5 MCG/INH AEPB Inhale 1 puff into the lungs daily.    . ursodiol (ACTIGALL) 500 MG tablet TAKE 1 TABLET TWICE A DAY WITH MEALS 180 tablet  3   No current facility-administered medications for this visit.    Allergies as of 04/02/2021 - Review Complete 04/02/2021  Allergen Reaction Noted  . Aricept [donepezil hcl]  02/16/2014  . Namenda [memantine hcl]  02/16/2014  . Adcirca [tadalafil] Other (See Comments) 04/20/2019  . Donepezil Nausea Only 01/20/2016  . Spironolactone Other (See Comments) 12/16/2015  . Codeine Rash 02/16/2014  . Indocin [indomethacin] Rash 09/11/2014  . Latex Rash 02/16/2014  . Penicillins Rash 01/11/2007    Family History  Problem Relation Age of Onset  . Heart failure Mother   . Kidney cancer Mother   . Cancer Mother   . Hypertension Mother   . Lung cancer Father        Brain METS  . Cancer Father   . Cirrhosis Brother   . Kidney cancer Sister   . Cancer Sister   . Hypertension Sister   . Diabetes Son   . Heart attack Neg Hx   . Stroke Neg Hx     Social History   Socioeconomic History  . Marital status: Divorced    Spouse name: Not on file  . Number of children: 4  . Years of education: Not on file  . Highest education level: Not on file  Occupational History  . Occupation: RETIRED FACTORY WORKER    Comment: [employer]  Tobacco Use  . Smoking status: Former Smoker    Packs/day: 0.50    Years: 40.00    Pack years: 20.00    Types: Cigarettes    Quit date: 03/30/2013    Years since quitting: 8.0  . Smokeless  tobacco: Never Used  . Tobacco comment: Quit since 03/2013  Vaping Use  . Vaping Use: Never used  Substance and Sexual Activity  . Alcohol use: No    Alcohol/week: 0.0 standard drinks  . Drug use: No  . Sexual activity: Never  Other Topics Concern  . 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 Started age 80.   Drug use -no   Alcohol use - no   Lives with son   Education - 12 th grade                  Social Determinants of Health   Financial Resource Strain: Not on file  Food Insecurity: Not on file  Transportation Chambers: Not on file  Physical Activity: Not on file  Stress: Not on file  Social Connections: Not on file    Review of Systems: Gen: Denies fever, chills, anorexia. Denies fatigue, weakness, weight loss.  CV: Denies chest pain, palpitations, syncope, peripheral edema, and claudication. Resp: Denies dyspnea at rest, cough, wheezing, coughing up blood, and pleurisy. GI: see HPI Derm: Denies rash, itching, dry skin Psych: Denies depression, anxiety, memory loss, confusion. No homicidal or suicidal ideation.  Heme: Denies bruising, bleeding, and enlarged lymph nodes.  Physical Exam: BP (!) 157/63   Pulse 65   Temp (!) 97.1 F (36.2 C) (Temporal)   Ht _0  (1.626 m)   Wt 149 lb (67.6 kg)   BMI 25.58 kg/m  General:   Alert and oriented. No distress noted. Pleasant and cooperative.  Head:  Normocephalic and atraumatic. Eyes:  Conjuctiva clear without scleral icterus. Mouth:  Mask in place Abdomen:  +  BS, soft, non-tender and non-distended. No rebound or guarding. No HSM or masses noted. Msk:  Symmetrical without gross deformities. Normal posture. Extremities:  Without edema. Neurologic:  Alert and  oriented x4 Psych:  Alert and cooperative. Normal mood and  affect.  ASSESSMENT: ANDREKA STUCKI is an 87 y.o. female presenting today with a history of PBC, cirrhosis, GERD, dysphagia. Last EGD in Feb 2018 with normal esophagus and gastritis. History of PUD in 2017 due to NSAIDs. Mildly elevated AFP historically, with MRI in 2018 negative for hematoma. Chambers updated AFP. Due for updated [place].   Now with epigastric pain intermittently without any obvious precipitating factors. Historically, she has dealt with vague dysphagia but denies at office visit today. Continues on Protonix BID. We discussed EGD in near future, possible dilation if needed. Will need to request to hold Plavix.    PLAN:  Requesting to hold Plavix X 5 days (this has been approved) Proceed with upper endoscopy +/- dilation by Dr. Abbey Chatters in near future: the risks, benefits, and alternatives have been discussed with the patient in detail. The patient states understanding and desires to proceed.  Continue PPI BID Continue Urso BID Labs today (INR and AFP tumor marker for baseline) US abdomen in near future 6 month follow-up  Michele Needs, PhD, ANP-BC [facility]

## 2021-04-02 NOTE — Telephone Encounter (Signed)
Daughter just saw [name] today and she was to be scheduled for an [place] --she got home and her calendar says she already is scheduled for Monday for an [place].

## 2021-04-03 ENCOUNTER — Other Ambulatory Visit (HOSPITAL_COMMUNITY): Payer: Self-pay | Admitting: Family Medicine

## 2021-04-07 ENCOUNTER — Ambulatory Visit (HOSPITAL_COMMUNITY)
Admission: RE | Admit: 2021-04-07 | Discharge: 2021-04-07 | Disposition: A | Discharge status: Home / Self Care | Payer: Medicare Other | Source: Ambulatory Visit | Attending: Gastroenterology | Admitting: Gastroenterology

## 2021-04-07 ENCOUNTER — Other Ambulatory Visit (HOSPITAL_COMMUNITY): Payer: Self-pay | Admitting: Family Medicine

## 2021-04-07 DIAGNOSIS — K7460 Unspecified cirrhosis of liver: Secondary | ICD-10-CM | POA: Insufficient documentation

## 2021-04-07 DIAGNOSIS — N644 Mastodynia: Secondary | ICD-10-CM

## 2021-04-07 DIAGNOSIS — K7469 Other cirrhosis of liver: Secondary | ICD-10-CM | POA: Diagnosis not present

## 2021-04-08 ENCOUNTER — Telehealth: Payer: Self-pay

## 2021-04-08 LAB — PROTIME-INR
INR: 0.9
Prothrombin Time: 9.5 s (ref 9.0–11.5)

## 2021-04-08 LAB — AFP TUMOR MARKER: AFP-Tumor Marker: 8.5 ng/mL — ABNORMAL HIGH

## 2021-04-08 NOTE — Telephone Encounter (Signed)
[name]  We got the letter back from the [facility] stating that she was not a pt there and for [place] to please update our files. I phoned and spoke with her daughter Leisel Pinette @ [phone] and she advised me that her mother see's Dr. [name] @ [facility]. She advises that their telephone number is [phone].  You had sent a letter trying to get clearance for an EGD with possible dilation for the patient. We needed clearance to see if it was ok to hold the pt's plavix for 5 days prioe to the procedure. I have laid the return fax on your desk.

## 2021-04-09 ENCOUNTER — Telehealth: Payer: Self-pay

## 2021-04-09 NOTE — Telephone Encounter (Signed)
Letter already faxed this morning

## 2021-04-09 NOTE — Telephone Encounter (Signed)
Please request from Dr. Theda Sers in this case. Thanks!

## 2021-04-09 NOTE — Telephone Encounter (Signed)
Letter sent to Dr. [name] @ [facility] for clearance to have EGD. Waiting on response.

## 2021-04-14 ENCOUNTER — Other Ambulatory Visit: Payer: Self-pay | Admitting: Cardiology

## 2021-04-15 ENCOUNTER — Encounter: Payer: Self-pay | Admitting: *Deleted

## 2021-04-15 ENCOUNTER — Telehealth: Payer: Self-pay

## 2021-04-15 NOTE — Telephone Encounter (Signed)
Prior authorization came for this pt to hold her Plavix for 5 days prior to her procedure from Dr. [name].

## 2021-04-15 NOTE — Telephone Encounter (Signed)
Noted please place approval in Anna's box for her to sign off on and send to scan. We will call pt to schedule

## 2021-04-15 NOTE — Telephone Encounter (Signed)
Prior authorization came for this pt to hold her Plavix for 5 days prior to her procedure from Dr. [name].      Documentation

## 2021-04-15 NOTE — Telephone Encounter (Addendum)
[name] I, CMA  04/15/21 8:05 AM Note Prior authorization came for this pt to hold her Plavix for 5 days prior to her procedure from Dr. [name].

## 2021-04-15 NOTE — Telephone Encounter (Signed)
See 5/24 phone note. Copied/paste below to follow same string of messages

## 2021-04-15 NOTE — Telephone Encounter (Signed)
Spoke with patient daughter. Aware received approval to hold plavix 5 days prior to procedure. Procedure scheduled for 6/21 at 1:45pm. Aware will be told at pre-op her arrival time. Advised will mail instructions with pre-op appt. Confirmed mailing address.

## 2021-04-15 NOTE — Progress Notes (Signed)
Cc'ed to pcp

## 2021-04-15 NOTE — Telephone Encounter (Signed)
Letter placed in AB box for her review and to be sent for scan once done.  Called pt, LMOVM

## 2021-04-16 DIAGNOSIS — I10 Essential (primary) hypertension: Secondary | ICD-10-CM | POA: Diagnosis not present

## 2021-04-21 ENCOUNTER — Other Ambulatory Visit: Payer: Self-pay

## 2021-04-21 DIAGNOSIS — I471 Supraventricular tachycardia: Secondary | ICD-10-CM

## 2021-04-21 MED ORDER — MACITENTAN 10 MG PO TABS: 10.0000 mg | ORAL_TABLET | Freq: Every day | ORAL | 0 refills | Status: DC

## 2021-04-25 ENCOUNTER — Ambulatory Visit: Payer: Medicare Other | Admitting: Cardiology

## 2021-04-29 ENCOUNTER — Encounter (HOSPITAL_COMMUNITY)

## 2021-04-29 ENCOUNTER — Ambulatory Visit (HOSPITAL_COMMUNITY)
Admission: RE | Admit: 2021-04-29 | Discharge: 2021-04-29 | Disposition: A | Discharge status: Home / Self Care | Payer: Medicare Other | Source: Ambulatory Visit | Attending: Family Medicine | Admitting: Family Medicine

## 2021-04-29 ENCOUNTER — Telehealth: Payer: Self-pay | Admitting: *Deleted

## 2021-04-29 DIAGNOSIS — N644 Mastodynia: Secondary | ICD-10-CM

## 2021-04-29 DIAGNOSIS — R922 Inconclusive mammogram: Secondary | ICD-10-CM | POA: Diagnosis not present

## 2021-04-29 NOTE — Telephone Encounter (Signed)
Received VM from daughter. Wanting to cancel upcoming procedure and did not want to r/s at this time. Called daughter back and made aware will cancel and they will call when they decide to r/s. FYI to [employer]

## 2021-05-01 ENCOUNTER — Encounter (HOSPITAL_COMMUNITY): Payer: Medicare Other

## 2021-05-06 ENCOUNTER — Encounter (HOSPITAL_COMMUNITY): Payer: Self-pay

## 2021-05-06 ENCOUNTER — Ambulatory Visit (HOSPITAL_COMMUNITY): Admit: 2021-05-06 | Payer: Medicare Other

## 2021-05-06 SURGERY — ESOPHAGOGASTRODUODENOSCOPY (EGD) WITH PROPOFOL
Anesthesia: Monitor Anesthesia Care

## 2021-05-15 DIAGNOSIS — I10 Essential (primary) hypertension: Secondary | ICD-10-CM | POA: Diagnosis not present

## 2021-05-18 DIAGNOSIS — Z20822 Contact with and (suspected) exposure to covid-19: Secondary | ICD-10-CM | POA: Diagnosis not present

## 2021-05-26 DIAGNOSIS — S9001XA Contusion of right ankle, initial encounter: Secondary | ICD-10-CM | POA: Diagnosis not present

## 2021-05-26 DIAGNOSIS — M25571 Pain in right ankle and joints of right foot: Secondary | ICD-10-CM | POA: Diagnosis not present

## 2021-06-03 ENCOUNTER — Other Ambulatory Visit: Payer: Self-pay

## 2021-06-03 DIAGNOSIS — I471 Supraventricular tachycardia: Secondary | ICD-10-CM

## 2021-06-03 MED ORDER — MACITENTAN 10 MG PO TABS: 10.0000 mg | ORAL_TABLET | Freq: Every day | ORAL | 1 refills | Status: DC

## 2021-06-04 ENCOUNTER — Ambulatory Visit: Payer: Medicare Other | Admitting: Cardiology

## 2021-06-06 ENCOUNTER — Other Ambulatory Visit: Payer: Self-pay | Admitting: Cardiology

## 2021-06-16 ENCOUNTER — Encounter: Payer: Self-pay | Admitting: Cardiology

## 2021-06-16 ENCOUNTER — Other Ambulatory Visit: Payer: Self-pay

## 2021-06-16 ENCOUNTER — Ambulatory Visit: Payer: Medicare Other | Admitting: Cardiology

## 2021-06-16 VITALS — BP 157/54 | HR 56 | Temp 98.3°F | Resp 16 | Ht 64.0 in | Wt 149.2 lb

## 2021-06-16 DIAGNOSIS — R0989 Other specified symptoms and signs involving the circulatory and respiratory systems: Secondary | ICD-10-CM

## 2021-06-16 DIAGNOSIS — I270 Primary pulmonary hypertension: Secondary | ICD-10-CM

## 2021-06-16 DIAGNOSIS — K7469 Other cirrhosis of liver: Secondary | ICD-10-CM | POA: Diagnosis not present

## 2021-06-16 DIAGNOSIS — K743 Primary biliary cirrhosis: Secondary | ICD-10-CM | POA: Diagnosis not present

## 2021-06-16 DIAGNOSIS — I6523 Occlusion and stenosis of bilateral carotid arteries: Secondary | ICD-10-CM

## 2021-06-16 DIAGNOSIS — I10 Essential (primary) hypertension: Secondary | ICD-10-CM | POA: Diagnosis not present

## 2021-06-16 DIAGNOSIS — Z66 Do not resuscitate: Secondary | ICD-10-CM | POA: Diagnosis not present

## 2021-06-16 NOTE — Progress Notes (Signed)
Primary Physician/Referring:  [name], DO  Patient ID: [name], female    DOB: [birth year], 87 y.o.   MRN: [id number]  Chief Complaint  Patient presents with   Pulmonary Hypertension   Follow-up    3 months   HPI: [name]  is a 87 y.o. female  AAF patient with primary pulmonary hypertension,  cryptogenic cirrhosis of the liver, presently on Opsumit since July 2018, did not tolerate Adcirca or Sildanafil due to visual side effects. She is also being followed at [facility] by Dr. Suzi Roots, last seen in July 2021 in November 2018 recommended continuing present medical therapy in view of advanced age and other underlying comorbidity difficult to delineate 6-minute walk test was appropriate.  She has history of breast cancer status post radical mastectomy and chemotherapy, well controlled diabetes mellitus, moderate coronary artery disease and bilateral carotid artery occlusion without stroke and is on chronic plavix.  No recent hospital visits or ED visits. Denies leg edema, denies worsening dyspnea, no chest pain or palpitations.  Past Medical History:  Diagnosis Date   Acute delirium 06/17/7077   Acute diastolic congestive heart failure ([place]) 03/21/2014   Anxiety    Cancer ([place])    Carotid artery disease (HCC)    Bilateral ICA occlusion   Chronic headaches    Chronic liver disease    ?cirrhosis on 10/2014 CT. H/O elevated alkphos and +AMA on URSO.   Coronary atherosclerosis of native coronary artery    Mild nonobstructive disease at cardiac catheterization May 2011   Depression    Essential hypertension, benign    Fracture of femoral neck, right, closed ([place]) 03/28/2014   History of breast cancer    History of GI bleed    Hyperlipidemia    Orthostatic hypotension    Diabetic polyneuropathy/diabetic dysautonomia    Osteopenia    Parotid tumor    Pneumonia 11/2016   Pneumonia    Pressure ulcer, heel, right, unstageable ([place]) 04/17/2014   PSVT (paroxysmal  supraventricular tachycardia) ([place])    Syncope and collapse 04/15/2010   Qualifier: Diagnosis of  By: Angelena Form, MD, Christopher     Type 2 diabetes mellitus ([place])    UPPER GASTROINTESTINAL HEMORRHAGE 03/21/2007   Qualifier: Diagnosis of  By: Jonna Munro MD, Roderic Scarce      Past Surgical History:  Procedure Laterality Date   ABDOMINAL HYSTERECTOMY     APPENDECTOMY     CATARACT EXTRACTION Bilateral    Dr. Gershon Crane   CHOLECYSTECTOMY  2008   COLONOSCOPY  2010   Dr. Oneida Alar: single tubular adenoma, one hyperplastic polyp. Next TCS 2020 health permitting.   ESOPHAGOGASTRODUODENOSCOPY N/A 08/17/2016   web in upper third of esophagus s/p dilation, five gastric ulcers without bleeding   ESOPHAGOGASTRODUODENOSCOPY N/A 01/01/2017    normal esophagus, gastritis   HEMORRHOID SURGERY  1960s   HIP ARTHROPLASTY Right 03/28/2014   Procedure: ARTHROPLASTY BIPOLAR HIP;  Surgeon: Carole Civil, MD;  Location: [facility];  Service: Orthopedics;  Laterality: Right;   LAPAROSCOPIC APPENDECTOMY N/A 04/13/2013   Procedure: APPENDECTOMY LAPAROSCOPIC;  Surgeon: Donato Heinz, MD;  Location: [facility];  Service: General;  Laterality: N/A;   MASTECTOMY  1994   Left breast -related to breast cancer   RIGHT HEART CATH N/A 04/29/2017   Procedure: Right Heart Cath;  Surgeon: Adrian Prows, MD;  Location: [place] [facility];  Service: Cardiovascular;  Laterality: N/A;   RIGHT/LEFT HEART CATH AND CORONARY ANGIOGRAPHY N/A 04/13/2017   Procedure: RIGHT/LEFT HEART CATH  AND CORONARY ANGIOGRAPHY;  Surgeon: Adrian Prows, MD;  Location: [place] [facility];  Service: Cardiovascular;  Laterality: N/A;   SAVORY DILATION N/A 08/17/2016   Procedure: SAVORY DILATION;  Surgeon: Danie Binder, MD;  Location: [facility];  Service: Endoscopy;  Laterality: N/A;   VESICOVAGINAL FISTULA CLOSURE W/ TAH  8756   YAG LASER APPLICATION Left    Dr. Zadie Rhine   Social History   Tobacco Use   Smoking status: Former    Packs/day: 0.50    Years: 40.00     Pack years: 20.00    Types: Cigarettes    Quit date: 03/30/2013    Years since quitting: 8.2   Smokeless tobacco: Never   Tobacco comments:    Quit since 03/2013  Substance Use Topics   Alcohol use: No    Alcohol/week: 0.0 standard drinks   Review of Systems  Eyes:  Positive for blurred vision.  Cardiovascular:  Positive for dyspnea on exertion. Negative for chest pain and leg swelling.  Musculoskeletal:  Positive for arthritis and joint pain.  Gastrointestinal:  Negative for melena.  Objective  Blood pressure (!) 174/76, pulse 60, temperature 98.3 F (36.8 C), temperature source Temporal, resp. rate 16, height _0  (1.626 m), weight 149 lb 3.2 oz (67.7 kg), SpO2 99 %. Body mass index is 25.61 kg/m. Vitals with BMI 06/16/2021 04/02/2021 01/22/2021  Height _1  _2  _3   Weight 149 lbs 3 oz 149 lbs 150 lbs 6 oz  BMI 25.6 43.32 95.1  Systolic 884 166 063  Diastolic 76 63 60  Pulse 60 65 64   . Physical Exam Constitutional:      General: She is not in acute distress.    Appearance: She is well-developed.  Neck:     Vascular: No carotid bruit (right more prominant) or JVD.  Cardiovascular:     Rate and Rhythm: Normal rate and regular rhythm.     Pulses: Normal pulses and intact distal pulses.     Heart sounds: Murmur heard.  Blowing midsystolic murmur is present with a grade of 2/6 at the lower left sternal border.    No gallop.  Pulmonary:     Effort: Pulmonary effort is normal.     Breath sounds: Examination of the left-middle field reveals rales. Examination of the left-lower field reveals rhonchi and rales. Rhonchi and rales present.  Abdominal:     General: Bowel sounds are normal.     Palpations: Abdomen is soft.  Musculoskeletal:        General: No swelling.     Cervical back: Neck supple.  Skin:    Capillary Refill: Capillary refill takes less than 2 seconds.   Radiology: No results found.  Laboratory examination:   CMP Latest Ref Rng & Units 07/09/2020  05/08/2019 12/26/2018  Glucose 70 - 99 mg/dL 184(H) - 167(H)  BUN 8 - 23 mg/dL 22 - 17  Creatinine 0.44 - 1.00 mg/dL 0.81 0.80 0.81  Sodium 135 - 145 mmol/L 134(L) - 135  Potassium 3.5 - 5.1 mmol/L 3.6 - 4.2  Chloride 98 - 111 mmol/L 98 - 96(L)  CO2 22 - 32 mmol/L 26 - 33(H)  Calcium 8.9 - 10.3 mg/dL 8.9 - 9.3  Total Protein 6.5 - 8.1 g/dL 7.6 - 7.4  Total Bilirubin 0.3 - 1.2 mg/dL 0.4 - 0.5  Alkaline Phos 38 - 126 U/L 85 - -  AST 15 - 41 U/L 22 - 19  ALT 0 - 44 U/L 15 -  10   CBC Latest Ref Rng & Units 07/09/2020 12/26/2018 07/20/2018  WBC 4.0 - 10.5 K/uL 4.5 4.1 4.0  Hemoglobin 12.0 - 15.0 g/dL 11.6(L) 12.1 11.2(L)  Hematocrit 36.0 - 46.0 % 35.9(L) 35.7 34.0(L)  Platelets 150 - 400 K/uL 182 219 196   External labs:  Cholesterol, total 184.000 M 03/05/2021 HDL 69.000 MG 03/05/2021 LDL 96.000 MG 03/05/2021 Triglycerides 105.000 M 03/05/2021  A1C 7.400 % 03/05/2021 TSH 2.900 03/05/2021  Hemoglobin 11.600 g/d 07/09/2020  Creatinine, Serum 1.020 MG/ 03/05/2021 Potassium 3.600 mm 07/09/2020 ALT (SGPT) 15.000 IU/ 03/05/2021  Current Outpatient Medications on File Prior to Visit  Medication Sig Dispense Refill   acetaminophen (TYLENOL) 500 MG tablet Take 500 mg by mouth every 8 (eight) hours as needed for mild pain or moderate pain.     ALPRAZolam (XANAX) 0.5 MG tablet Take 0.5 mg by mouth 2 (two) times daily as needed for anxiety.      atorvastatin (LIPITOR) 10 MG tablet Take 1 tablet (10 mg total) by mouth daily. 30 tablet 0   cholecalciferol (VITAMIN D) 1000 UNITS tablet Take 2,000 Units by mouth every morning.     citalopram (CELEXA) 20 MG tablet Take 1 tablet by mouth daily.     clopidogrel (PLAVIX) 75 MG tablet Take 75 mg by mouth daily.     dextromethorphan-guaiFENesin (MUCINEX DM) 30-600 MG 12hr tablet Take 1 tablet by mouth 2 (two) times daily.     docusate sodium (COLACE) 100 MG capsule Take 100 mg by mouth 2 (two) times daily.     feeding supplement, GLUCERNA SHAKE, (GLUCERNA  SHAKE) LIQD Take 237 mLs by mouth daily as needed.      FREESTYLE LITE test strip 1 each 3 (three) times daily.     furosemide (LASIX) 40 MG tablet Take 1.5 tablets (60 mg total) by mouth daily. 135 tablet 3   hydrALAZINE (APRESOLINE) 10 MG tablet TAKE 1 TABLET IN THE MORNING AND AT BEDTIME 180 tablet 3   insulin aspart (NOVOLOG) 100 UNIT/ML FlexPen Inject 5 Units into the skin 3 (three) times daily with meals. When BG is over 100     insulin glargine (LANTUS) 100 UNIT/ML Solostar Pen Inject 8 Units into the skin at bedtime.      L-Methylfolate-B12-B6-B2 (CEREFOLIN PO) Take 1 tablet by mouth daily.     loratadine (CLARITIN) 10 MG tablet Take 10 mg by mouth daily.     macitentan (OPSUMIT) 10 MG tablet Take 1 tablet (10 mg total) by mouth daily. 90 tablet 1   metoprolol tartrate (LOPRESSOR) 25 MG tablet Take 12.5 mg by mouth 2 (two) times daily. 12.31m in the AM, 12.52min the PM     mirtazapine (REMERON) 30 MG tablet Take 30 mg by mouth at bedtime.     Multiple Vitamin (MULTIVITAMIN) tablet Take 1 tablet by mouth daily. Reported on 02/06/2016     olmesartan (BENICAR) 5 MG tablet TAKE 1 TABLET DAILY 90 tablet 3   pantoprazole (PROTONIX) 40 MG tablet TAKE 1 TABLET TWICE A DAY BEFORE MEALS 180 tablet 3   Polyethyl Glycol-Propyl Glycol (SYSTANE OP) Apply to eye as needed.     potassium chloride (KLOR-CON) 10 MEQ tablet TAKE 1 TABLET DAILY 90 tablet 3   psyllium (METAMUCIL SMOOTH TEXTURE) 28 % packet Take 1 packet by mouth as needed.      sodium chloride (OCEAN) 0.65 % SOLN nasal spray Place 1 spray into both nostrils as needed for congestion.     thyroid (ARMOUR) 15  MG tablet Take 15 mg by mouth daily.     umeclidinium bromide (INCRUSE ELLIPTA) 62.5 MCG/INH AEPB Inhale 1 puff into the lungs daily.     ursodiol (ACTIGALL) 500 MG tablet TAKE 1 TABLET TWICE A DAY WITH MEALS 180 tablet 3   No current facility-administered medications on file prior to visit.    Medications after present  encounter   Current Outpatient Medications  Medication Instructions   acetaminophen (TYLENOL) 500 mg, Oral, Every 8 hours PRN   ALPRAZolam (XANAX) 0.5 mg, Oral, 2 times daily PRN   atorvastatin (LIPITOR) 10 mg, Oral, Daily   cholecalciferol (VITAMIN D) 2,000 Units, Oral, Every morning   citalopram (CELEXA) 20 MG tablet 1 tablet, Oral, Daily   clopidogrel (PLAVIX) 75 mg, Oral, Daily   dextromethorphan-guaiFENesin (MUCINEX DM) 30-600 MG 12hr tablet 1 tablet, Oral, 2 times daily   docusate sodium (COLACE) 100 mg, Oral, 2 times daily   feeding supplement, GLUCERNA SHAKE, (GLUCERNA SHAKE) LIQD 237 mLs, Oral, Daily PRN   FREESTYLE LITE test strip 1 each, 3 times daily   furosemide (LASIX) 60 mg, Oral, Daily   hydrALAZINE (APRESOLINE) 10 MG tablet TAKE 1 TABLET IN THE MORNING AND AT BEDTIME   insulin aspart (NOVOLOG) 5 Units, Subcutaneous, 3 times daily with meals, When BG is over 100    insulin glargine (LANTUS) 8 Units, Subcutaneous, Daily at bedtime   L-Methylfolate-B12-B6-B2 (CEREFOLIN PO) 1 tablet, Oral, Daily   loratadine (CLARITIN) 10 mg, Oral, Daily   macitentan (OPSUMIT) 10 mg, Oral, Daily   metoprolol tartrate (LOPRESSOR) 12.5 mg, Oral, 2 times daily, 12.79m in the AM, 12.553min the PM   mirtazapine (REMERON) 30 mg, Daily at bedtime   Multiple Vitamin (MULTIVITAMIN) tablet 1 tablet, Oral, Daily, Reported on 02/06/2016   olmesartan (BENICAR) 5 MG tablet TAKE 1 TABLET DAILY   pantoprazole (PROTONIX) 40 MG tablet TAKE 1 TABLET TWICE A DAY BEFORE MEALS   Polyethyl Glycol-Propyl Glycol (SYSTANE OP) Ophthalmic, As needed   potassium chloride (KLOR-CON) 10 MEQ tablet TAKE 1 TABLET DAILY   psyllium (METAMUCIL SMOOTH TEXTURE) 28 % packet 1 packet, Oral, As needed   sodium chloride (OCEAN) 0.65 % SOLN nasal spray 1 spray, Each Nare, As needed   thyroid (ARMOUR) 15 mg, Oral, Daily   umeclidinium bromide (INCRUSE ELLIPTA) 62.5 MCG/INH AEPB 1 puff, Inhalation, Daily   ursodiol (ACTIGALL) 500 MG  tablet TAKE 1 TABLET TWICE A DAY WITH MEALS    Cardiac Studies:   Carotid artery duplex 04Apr 29, 2018Bilateral internal carotid artery flow appears to be sluggish and may suggest complete occlusion. Left vertebral artery flow is undetectable (no flow). Antegrade right vertebral artery flow. Consider different modality of imaging to better define anatomy.  Right Heart Cath 04/29/2017, RA pressure 8/2, mean 2 mmHg. RV 78/1, EDP 3 mmHg. PA 80/18, mean 42 mmHg. PW 13/14, mean 11 mmHg. PA saturation was 73%, aortic saturation 98%. Cardiac output 4.88, cardiac index 2.85 by Fick.  Echocardiogram 01/03/2020:  1. Normal LV systolic function with visual EF 60-65%. Left ventricle  cavity is normal in size. Normal left ventricular wall thickness. Normal  global wall motion. Doppler evidence of grade II (pseudonormal) diastolic  dysfunction, elevated LAP. Calculated EF 56%.  2. Left atrial cavity is moderate to severely dilated.  3. Right atrial cavity is moderately dilated.  4. Mild (Grade I) mitral regurgitation.  5. Normal RV size and function.  Moderate tricuspid regurgitation. Moderate pulmonary hypertension. RVSP  measures 54 mmHg.  6. Moderate pulmonic regurgitation.  7. IVC is normal with blunted respiratory response.  8. Prior study dated Echocardiogram 10/21/2018, no significant change.   [facility] ECHOCARDIOGRAM COMPLETE 01/03/2020  Narrative Echocardiogram 01/03/2020: 1. Normal LV systolic function with visual EF 60-65%. Left ventricle cavity is normal in size. Normal left ventricular wall thickness. Normal global wall motion. Doppler evidence of grade II (pseudonormal) diastolic dysfunction, elevated LAP. Calculated EF 56%. 2. Left atrial cavity is moderate to severely dilated. 3. Right atrial cavity is moderately dilated. 4. Mild (Grade I) mitral regurgitation. 5. Moderate tricuspid regurgitation. Moderate pulmonary hypertension. RVSP measures 54 mmHg. 6. Moderate pulmonic  regurgitation. 7. IVC is normal with blunted respiratory response. 8. Prior study dated Echocardiogram 10/21/2018: LVEF 64%, Grade III (restrictive) diastolic dysfunction, elevated LAP. Left atrial cavity is mild to moderately dilated. Right atrial cavity is mild to moderately dilated. Mild (Grade I) mitral regurgitation. Moderate tricuspid regurgitation. Moderate pulmonary hypertension with approx. PA syst. pressure of 50 mm of Hg  EKG:  EKG 01/22/2021: Sinus rhythm with first-degree AV block at rate of 62 bpm, P pulmonale, normal axis.  No evidence of ischemia, normal QT interval.No significant change from  EKG 02/08/2020.     SIX MIN WALK 01/22/2021 07/25/2020 05/24/2020 04/25/2019 04/25/2019  Medications none - - - -  Supplimental Oxygen during Test? (L/min) Yes Yes Yes Yes -  O2 Flow Rate _0 - -  Type - Continuous Continuous Continuous -  Laps - - - 6 6  Laps _1 - -  Partial Lap (in Meters) 1 0 0 0 -  Baseline Heartrate 81 68 73 69 -  Baseline SPO2 98 100 94 94 -  2 Minute Oxygen Saturation % 95 99 93 - -  2 Minute HR 97 103 86 - -  4 Minute Oxygen Saturation % 95 94 92 - -  4 Minute HR 116 114 92 - -  6 Minute Oxygen Saturation % 95 93 93 - -  6 Minute HR 124 119 92 - -  Heartrate - 118 - 102 -  SPO2 - 92 - 91 -  Heartrate 84 - - 83 -  SPO2 95 - - 92 -  Stopped or Paused before Six Minutes No No No No -  Interpretation - - Hip pain - -  Distance Completed - - - 204 -  Distance Completed 221 120 100 - -  Tech Comments: Patient walked all the way through the 6 minutes without stopping. Daughter stated she has been practicing her 6 minute walk at home. Patient did not take a break during the walk. Patient did not stop at all, but walked a a very slow pace. pt walked 6 laps in 6 minutes -  Total distance 120 meters.   Assessment   No diagnosis found.   No orders of the defined types were placed in this encounter.  Medications Discontinued During This Encounter  Medication  Reason   clotrimazole-betamethasone (LOTRISONE) cream Error   metoprolol (LOPRESSOR) 50 MG tablet Error    No orders of the defined types were placed in this encounter.    Recommendations:   [name]  is a 87 y.o. AAF patient with primary pulmonary hypertension,  cryptogenic cirrhosis of the liver, presently on Opsumit since July 2018, did not tolerate Adcirca or Sildanafil due to visual side effects. She is also being followed at [facility] by Dr. Suzi Roots, last seen in July 2021 in November 2018 recommended continuing present medical therapy in view of advanced  age and other underlying comorbidity difficult to delineate 6-minute walk test was appropriate.  She has history of breast cancer status post radical mastectomy and chemotherapy, well controlled diabetes mellitus, moderate coronary artery disease and bilateral carotid artery occlusion without stroke and is on chronic plavix.   She is remained stable without clinical evidence of acute decompensated right-sided heart failure.  Since last office visit 3 months ago, she has not had any increasing oxygen needs.  I reviewed her labs, lipids are well controlled, blood pressure is well controlled, renal function has remained stable.  She has not had any carotid artery duplex, carotid bruit appears much more prominent, would like to repeat carotid duplex.  Would also like to know vertebral artery flow.  No changes in the medications done today, and see her back in 3 months with 6-minute walk test.  With regard to cirrhosis of liver, evidence of ascites or leg edema.   Adrian Prows, MD, [facility] 06/16/2021, 2:09 PM Office: [phone]

## 2021-06-18 DIAGNOSIS — Z20822 Contact with and (suspected) exposure to covid-19: Secondary | ICD-10-CM | POA: Diagnosis not present

## 2021-06-20 DIAGNOSIS — I10 Essential (primary) hypertension: Secondary | ICD-10-CM | POA: Diagnosis not present

## 2021-07-21 DIAGNOSIS — I10 Essential (primary) hypertension: Secondary | ICD-10-CM | POA: Diagnosis not present

## 2021-07-29 ENCOUNTER — Other Ambulatory Visit

## 2021-08-04 DIAGNOSIS — B351 Tinea unguium: Secondary | ICD-10-CM | POA: Diagnosis not present

## 2021-08-04 DIAGNOSIS — E1151 Type 2 diabetes mellitus with diabetic peripheral angiopathy without gangrene: Secondary | ICD-10-CM | POA: Diagnosis not present

## 2021-08-04 DIAGNOSIS — M79675 Pain in left toe(s): Secondary | ICD-10-CM | POA: Diagnosis not present

## 2021-08-04 DIAGNOSIS — M79674 Pain in right toe(s): Secondary | ICD-10-CM | POA: Diagnosis not present

## 2021-08-11 ENCOUNTER — Other Ambulatory Visit

## 2021-08-20 DIAGNOSIS — I10 Essential (primary) hypertension: Secondary | ICD-10-CM | POA: Diagnosis not present

## 2021-08-27 ENCOUNTER — Ambulatory Visit: Payer: Medicare Other

## 2021-08-27 ENCOUNTER — Other Ambulatory Visit: Payer: Self-pay

## 2021-08-27 DIAGNOSIS — I6523 Occlusion and stenosis of bilateral carotid arteries: Secondary | ICD-10-CM

## 2021-08-27 DIAGNOSIS — R0989 Other specified symptoms and signs involving the circulatory and respiratory systems: Secondary | ICD-10-CM | POA: Diagnosis not present

## 2021-08-27 DIAGNOSIS — I270 Primary pulmonary hypertension: Secondary | ICD-10-CM

## 2021-09-03 DIAGNOSIS — Z9841 Cataract extraction status, right eye: Secondary | ICD-10-CM | POA: Diagnosis not present

## 2021-09-03 DIAGNOSIS — H47012 Ischemic optic neuropathy, left eye: Secondary | ICD-10-CM | POA: Diagnosis not present

## 2021-09-03 DIAGNOSIS — Z9842 Cataract extraction status, left eye: Secondary | ICD-10-CM | POA: Diagnosis not present

## 2021-09-03 DIAGNOSIS — H52213 Irregular astigmatism, bilateral: Secondary | ICD-10-CM | POA: Diagnosis not present

## 2021-09-03 DIAGNOSIS — E113293 Type 2 diabetes mellitus with mild nonproliferative diabetic retinopathy without macular edema, bilateral: Secondary | ICD-10-CM | POA: Diagnosis not present

## 2021-09-08 DIAGNOSIS — Z8639 Personal history of other endocrine, nutritional and metabolic disease: Secondary | ICD-10-CM | POA: Diagnosis not present

## 2021-09-08 DIAGNOSIS — E7800 Pure hypercholesterolemia, unspecified: Secondary | ICD-10-CM | POA: Diagnosis not present

## 2021-09-08 DIAGNOSIS — E119 Type 2 diabetes mellitus without complications: Secondary | ICD-10-CM | POA: Diagnosis not present

## 2021-09-08 DIAGNOSIS — I10 Essential (primary) hypertension: Secondary | ICD-10-CM | POA: Diagnosis not present

## 2021-09-08 DIAGNOSIS — E039 Hypothyroidism, unspecified: Secondary | ICD-10-CM | POA: Diagnosis not present

## 2021-09-08 DIAGNOSIS — E539 Vitamin B deficiency, unspecified: Secondary | ICD-10-CM | POA: Diagnosis not present

## 2021-09-08 DIAGNOSIS — E559 Vitamin D deficiency, unspecified: Secondary | ICD-10-CM | POA: Diagnosis not present

## 2021-09-10 DIAGNOSIS — I10 Essential (primary) hypertension: Secondary | ICD-10-CM | POA: Diagnosis not present

## 2021-09-10 DIAGNOSIS — E119 Type 2 diabetes mellitus without complications: Secondary | ICD-10-CM | POA: Diagnosis not present

## 2021-09-15 ENCOUNTER — Encounter: Payer: Self-pay | Admitting: Pharmacist

## 2021-09-15 DIAGNOSIS — I10 Essential (primary) hypertension: Secondary | ICD-10-CM | POA: Diagnosis not present

## 2021-09-15 DIAGNOSIS — E1159 Type 2 diabetes mellitus with other circulatory complications: Secondary | ICD-10-CM | POA: Diagnosis not present

## 2021-09-15 DIAGNOSIS — I272 Pulmonary hypertension, unspecified: Secondary | ICD-10-CM | POA: Diagnosis not present

## 2021-09-15 DIAGNOSIS — I5030 Unspecified diastolic (congestive) heart failure: Secondary | ICD-10-CM | POA: Diagnosis not present

## 2021-09-15 DIAGNOSIS — Z23 Encounter for immunization: Secondary | ICD-10-CM | POA: Diagnosis not present

## 2021-09-15 DIAGNOSIS — Z794 Long term (current) use of insulin: Secondary | ICD-10-CM | POA: Diagnosis not present

## 2021-09-15 DIAGNOSIS — I779 Disorder of arteries and arterioles, unspecified: Secondary | ICD-10-CM | POA: Diagnosis not present

## 2021-09-15 DIAGNOSIS — Z9981 Dependence on supplemental oxygen: Secondary | ICD-10-CM | POA: Diagnosis not present

## 2021-09-15 DIAGNOSIS — R0981 Nasal congestion: Secondary | ICD-10-CM | POA: Diagnosis not present

## 2021-09-15 DIAGNOSIS — Z0000 Encounter for general adult medical examination without abnormal findings: Secondary | ICD-10-CM | POA: Diagnosis not present

## 2021-09-15 DIAGNOSIS — R0989 Other specified symptoms and signs involving the circulatory and respiratory systems: Secondary | ICD-10-CM | POA: Diagnosis not present

## 2021-09-15 DIAGNOSIS — E039 Hypothyroidism, unspecified: Secondary | ICD-10-CM | POA: Diagnosis not present

## 2021-09-15 NOTE — Progress Notes (Signed)
CARE PLAN ENTRY  09/15/2021 Name: [name] MRN: [id number] DOB: 03-26-1931  [name] is enrolled in Remote Patient Monitoring/Principle Care Monitoring.  Date of Enrollment: 02/08/20 Supervising physician: Adrian Prows Indication: HTN  Remote Readings: Compliant and Avg BP: 120/54, HR:62  Next scheduled OV: 09/17/21  Pharmacist Clinical Goal(s):  Over the next 90 days, patient will demonstrate Improved medication adherence as evidenced by medication fill history Over the next 90 days, patient will demonstrate improved understanding of prescribed medications and rationale for usage as evidenced by patient teach back Over the next 90 days, patient will experience decrease in ED visits. ED visits in last 6 months = 0 Over the next 90 days, patient will not experience hospital admission. Hospital Admissions in last 6 months = 0  Interventions: Provider and Inter-disciplinary care team collaboration (see longitudinal plan of care) Comprehensive medication review performed. Discussed plans with patient for ongoing care management follow up and provided patient with direct contact information for care management team Collaboration with provider re: medication management  Patient Self Care Activities:  Self administers medications as prescribed Attends all scheduled provider appointments Performs ADL's independently Performs IADL's independently  Allergies  Allergen Reactions   Aricept [Donepezil Hcl]     Hives, vomiting and headache   Namenda [Memantine Hcl]     hives   Adcirca [Tadalafil] Other (See Comments)    Caused blindness in one eye   Donepezil Nausea Only   Spironolactone Other (See Comments)    Dizziness, balance problems   Codeine Rash   Indocin [Indomethacin] Rash   Latex Rash   Penicillins Rash   Outpatient Encounter Medications as of 09/15/2021  Medication Sig   acetaminophen (TYLENOL) 500 MG tablet Take 500 mg by mouth every 8 (eight) hours as  needed for mild pain or moderate pain.   ALPRAZolam (XANAX) 0.5 MG tablet Take 0.5 mg by mouth 2 (two) times daily as needed for anxiety.    atorvastatin (LIPITOR) 10 MG tablet Take 1 tablet (10 mg total) by mouth daily.   cholecalciferol (VITAMIN D) 1000 UNITS tablet Take 2,000 Units by mouth every morning.   citalopram (CELEXA) 20 MG tablet Take 1 tablet by mouth daily.   clopidogrel (PLAVIX) 75 MG tablet Take 75 mg by mouth daily.   dextromethorphan-guaiFENesin (MUCINEX DM) 30-600 MG 12hr tablet Take 1 tablet by mouth 2 (two) times daily.   docusate sodium (COLACE) 100 MG capsule Take 100 mg by mouth 2 (two) times daily.   feeding supplement, GLUCERNA SHAKE, (GLUCERNA SHAKE) LIQD Take 237 mLs by mouth daily as needed.    FREESTYLE LITE test strip 1 each 3 (three) times daily.   furosemide (LASIX) 40 MG tablet Take 1.5 tablets (60 mg total) by mouth daily.   hydrALAZINE (APRESOLINE) 10 MG tablet TAKE 1 TABLET IN THE MORNING AND AT BEDTIME   insulin aspart (NOVOLOG) 100 UNIT/ML FlexPen Inject 5 Units into the skin 3 (three) times daily with meals. When BG is over 100   insulin glargine (LANTUS) 100 UNIT/ML Solostar Pen Inject 8 Units into the skin at bedtime.    L-Methylfolate-B12-B6-B2 (CEREFOLIN PO) Take 1 tablet by mouth daily.   loratadine (CLARITIN) 10 MG tablet Take 10 mg by mouth daily.   macitentan (OPSUMIT) 10 MG tablet Take 1 tablet (10 mg total) by mouth daily.   metoprolol tartrate (LOPRESSOR) 25 MG tablet Take 12.5 mg by mouth 2 (two) times daily. 12.39m in the AM, 12.559min the PM   mirtazapine (  REMERON) 30 MG tablet Take 30 mg by mouth at bedtime.   Multiple Vitamin (MULTIVITAMIN) tablet Take 1 tablet by mouth daily. Reported on 02/06/2016   olmesartan (BENICAR) 5 MG tablet TAKE 1 TABLET DAILY   pantoprazole (PROTONIX) 40 MG tablet TAKE 1 TABLET TWICE A DAY BEFORE MEALS   Polyethyl Glycol-Propyl Glycol (SYSTANE OP) Apply to eye as needed.   potassium chloride (KLOR-CON) 10 MEQ  tablet TAKE 1 TABLET DAILY   psyllium (METAMUCIL SMOOTH TEXTURE) 28 % packet Take 1 packet by mouth as needed.    sodium chloride (OCEAN) 0.65 % SOLN nasal spray Place 1 spray into both nostrils as needed for congestion.   thyroid (ARMOUR) 15 MG tablet Take 15 mg by mouth daily.   umeclidinium bromide (INCRUSE ELLIPTA) 62.5 MCG/INH AEPB Inhale 1 puff into the lungs daily.   ursodiol (ACTIGALL) 500 MG tablet TAKE 1 TABLET TWICE A DAY WITH MEALS   No facility-administered encounter medications on file as of 09/15/2021.    Hypertension   BP goal is:  <130/80  Office blood pressures are  BP Readings from Last 3 Encounters:  06/16/21 (!) 157/54  04/02/21 (!) 157/63  01/22/21 (!) 156/60    Patient is currently controlled on the following medications: Metoprolol 12.5 mg BID, olmesartan 5 mg, hydralazine 10 mg BID, lasix 60 mg  Patient checks BP at home daily  Patient home BP readings are ranging: 98-148/42-70  Patient has tried  these meds in the past: amlodipine-benazepril, spironolactone, lisinopril, losartan, valsartan-HCTZ,   We discussed diet and exercise extensively  Plan  Continue current medications and control with diet and exercise   BP continues to remain stable and controlled on current medication therapy. Reviewed with pt and pt's daughter. Per daughter, pt feels like she has been giving out more easily recently. Continues to use [place] at 3L. Using Opsimut 10 mg in the meantime for PAH. Recent carotid and ECHO readings reviewed. Pt denies any lightheadedness, dizziness, falls, or injuries. Pt reports that she recently got labs completed through PCP office. PCP recommended pt discontinue her probiotic, and multivitamin. Metamucil frequency decreased. Will continue current therapy and continue remote monitoring.   ______________ Visit Information SDOH (Social Determinants of Health) assessments performed: Yes.  Ms. Telleria was given information about Principle Care  Management/Remote Patient Monitoring services today including:  RPM/PCM service includes personalized support from designated clinical staff supervised by her physician, including individualized plan of care and coordination with other care providers 24/7 contact phone numbers for assistance for urgent and routine care needs. Standard insurance, coinsurance, copays and deductibles apply for principle care management only during months in which we provide at least 30 minutes of these services. Most insurances cover these services at 100%, however patients may be responsible for any copay, coinsurance and/or deductible if applicable. This service may help you avoid the need for more expensive face-to-face services. Only one practitioner may furnish and bill the service in a calendar month. The patient may stop PCM/RPM services at any time (effective at the end of the month) by phone call to the office staff.  Patient agreed to services and verbal consent obtained.   Manuela Schwartz, Pharm.D. [facility] [phone] Ext: 120

## 2021-09-17 ENCOUNTER — Other Ambulatory Visit: Payer: Self-pay

## 2021-09-17 ENCOUNTER — Encounter: Payer: Self-pay | Admitting: Cardiology

## 2021-09-17 ENCOUNTER — Ambulatory Visit: Payer: Medicare Other | Admitting: Cardiology

## 2021-09-17 VITALS — BP 144/58 | HR 62 | Temp 98.7°F | Resp 16 | Ht 64.0 in | Wt 151.0 lb

## 2021-09-17 DIAGNOSIS — I10 Essential (primary) hypertension: Secondary | ICD-10-CM

## 2021-09-17 DIAGNOSIS — I270 Primary pulmonary hypertension: Secondary | ICD-10-CM | POA: Diagnosis not present

## 2021-09-17 DIAGNOSIS — I5032 Chronic diastolic (congestive) heart failure: Secondary | ICD-10-CM

## 2021-09-17 DIAGNOSIS — I6523 Occlusion and stenosis of bilateral carotid arteries: Secondary | ICD-10-CM | POA: Diagnosis not present

## 2021-09-17 NOTE — Progress Notes (Signed)
Primary Physician/Referring:  [name], DO  Patient ID: [name], female    DOB: 06/07/1931, 87 y.o.   MRN: [id number]  Chief Complaint  Patient presents with   Primary pulmonary hypertension     With 6-minute walk   Carotid bruit   Follow-up    3 months   HPI: [name]  is a 87 y.o. female  AAF patient with primary pulmonary hypertension,  cryptogenic cirrhosis of the liver, presently on Opsumit since July 2018, did not tolerate Adcirca or Sildanafil due to visual side effects. [name] is also being followed at [facility] by Dr. Suzi Roots, last seen in July 2021 in November 2018 recommended continuing present medical therapy in view of advanced age and other underlying comorbidity difficult to delineate 6-minute walk test was appropriate.  [name] has history of breast cancer status post radical mastectomy and chemotherapy, well controlled diabetes mellitus, moderate coronary artery disease and bilateral carotid artery occlusion without stroke and is on chronic plavix.  No recent hospital visits or ED visits. Denies leg edema, denies worsening dyspnea, no chest pain or palpitations.  [name] presents for 43-monthoffice visit.  States that [name] is doing well.  [name] underwent echocardiogram and carotid artery duplex.  Past Medical History:  Diagnosis Date   Acute delirium 85/12/8411  Acute diastolic congestive heart failure ([place] 03/21/2014   Anxiety    Cancer ([place]    Carotid artery disease (HCC)    Bilateral ICA occlusion   Chronic headaches    Chronic liver disease    ?cirrhosis on 10/2014 CT. H/O elevated alkphos and +AMA on URSO.   Coronary atherosclerosis of native coronary artery    Mild nonobstructive disease at cardiac catheterization May 2011   Depression    Essential hypertension, benign    Fracture of femoral neck, right, closed ([place] 03/28/2014   History of breast cancer    History of GI bleed    Hyperlipidemia    Orthostatic hypotension    Diabetic  polyneuropathy/diabetic dysautonomia    Osteopenia    Parotid tumor    Pneumonia 11/2016   Pneumonia    Pressure ulcer, heel, right, unstageable ([place] 04/17/2014   PSVT (paroxysmal supraventricular tachycardia) ([place]    Syncope and collapse 04/15/2010   Qualifier: Diagnosis of  By: MAngelena Form MD, Christopher     Type 2 diabetes mellitus ([place]    UPPER GASTROINTESTINAL HEMORRHAGE 03/21/2007   Qualifier: Diagnosis of  By: FJonna MunroMD, CRoderic Scarce     Past Surgical History:  Procedure Laterality Date   ABDOMINAL HYSTERECTOMY     APPENDECTOMY     CATARACT EXTRACTION Bilateral    Dr. SGershon Crane  CHOLECYSTECTOMY  2008   COLONOSCOPY  2010   Dr. FOneida Alar single tubular adenoma, one hyperplastic polyp. Next TCS 2020 health permitting.   ESOPHAGOGASTRODUODENOSCOPY N/A 08/17/2016   web in upper third of esophagus s/p dilation, five gastric ulcers without bleeding   ESOPHAGOGASTRODUODENOSCOPY N/A 01/01/2017    normal esophagus, gastritis   HEMORRHOID SURGERY  1960s   HIP ARTHROPLASTY Right 03/28/2014   Procedure: ARTHROPLASTY BIPOLAR HIP;  Surgeon: SCarole Civil MD;  Location: [facility];  Service: Orthopedics;  Laterality: Right;   LAPAROSCOPIC APPENDECTOMY N/A 04/13/2013   Procedure: APPENDECTOMY LAPAROSCOPIC;  Surgeon: BDonato Heinz MD;  Location: [facility];  Service: General;  Laterality: N/A;   MASTECTOMY  1994   Left breast -related to breast cancer   RIGHT HEART CATH N/A 04/29/2017   Procedure: Right Heart  Cath;  Surgeon: Adrian Prows, MD;  Location: [place] [facility];  Service: Cardiovascular;  Laterality: N/A;   RIGHT/LEFT HEART CATH AND CORONARY ANGIOGRAPHY N/A 04/13/2017   Procedure: RIGHT/LEFT HEART CATH AND CORONARY ANGIOGRAPHY;  Surgeon: Adrian Prows, MD;  Location: [place] [facility];  Service: Cardiovascular;  Laterality: N/A;   SAVORY DILATION N/A 08/17/2016   Procedure: SAVORY DILATION;  Surgeon: Danie Binder, MD;  Location: [facility];  Service: Endoscopy;  Laterality: N/A;    VESICOVAGINAL FISTULA CLOSURE W/ TAH  4098   YAG LASER APPLICATION Left    Dr. Zadie Rhine   Social History   Tobacco Use   Smoking status: Former    Packs/day: 0.50    Years: 40.00    Pack years: 20.00    Types: Cigarettes    Quit date: 03/30/2013    Years since quitting: 8.4   Smokeless tobacco: Never   Tobacco comments:    Quit since 03/2013  Substance Use Topics   Alcohol use: No    Alcohol/week: 0.0 standard drinks   Review of Systems  Eyes:  Negative for blurred vision.  Cardiovascular:  Positive for dyspnea on exertion (stable). Negative for chest pain and leg swelling.  Musculoskeletal:  Positive for arthritis and joint pain.  Gastrointestinal:  Negative for melena.  Objective  Blood pressure (!) 144/58, pulse 62, temperature 98.7 F (37.1 C), temperature source Temporal, resp. rate 16, height _0  (1.626 m), weight 151 lb (68.5 kg), SpO2 (!) 89 %. Body mass index is 25.92 kg/m. Vitals with BMI 09/17/2021 09/17/2021 06/16/2021  Height - _1  -  Weight - 151 lbs -  BMI - 11.91 -  Systolic 478 295 621  Diastolic 58 69 54  Pulse 62 63 56   . Physical Exam Constitutional:      General: [name] is not in acute distress.    Appearance: [name] is well-developed.  Neck:     Vascular: No carotid bruit (right more prominant) or JVD.  Cardiovascular:     Rate and Rhythm: Normal rate and regular rhythm.     Pulses: Normal pulses and intact distal pulses.     Heart sounds: Murmur heard.  Blowing midsystolic murmur is present with a grade of 2/6 at the lower left sternal border.    No gallop.  Pulmonary:     Effort: Pulmonary effort is normal.     Breath sounds: Examination of the left-middle field reveals rales. Examination of the left-lower field reveals rhonchi and rales. Rhonchi and rales present.  Abdominal:     General: Bowel sounds are normal.     Palpations: Abdomen is soft.  Musculoskeletal:        General: No swelling.     Cervical back: Neck supple.  Skin:     Capillary Refill: Capillary refill takes less than 2 seconds.   Radiology: No results found.  Laboratory examination:    External labs:  Labs 09/10/2021: Hb 11.9/HCT 34.9, platelets 163.  A1c 7.1%.  TSH normal.  BUN 27, creatinine 0.96, EGFR 61 mL, potassium 4.0.  Cholesterol, total 183.000 m 09/08/2021 HDL 69.000 mg 09/08/2021 LDL 96.000 MG 03/05/2021 Triglycerides 117.000 m 09/08/2021  Cholesterol, total 184.000 M 03/05/2021 HDL 69.000 MG 03/05/2021 LDL 96.000 MG 03/05/2021 Triglycerides 105.000 M 03/05/2021  A1C 7.400 % 03/05/2021 TSH 2.900 03/05/2021  Hemoglobin 11.600 g/d 07/09/2020  Creatinine, Serum 1.020 MG/ 03/05/2021 Potassium 3.600 mm 07/09/2020 ALT (SGPT) 15.000 IU/ 03/05/2021  Current Outpatient Medications on File Prior to Visit  Medication Sig Dispense Refill   acetaminophen (TYLENOL) 500 MG tablet Take 500 mg by mouth every 8 (eight) hours as needed for mild pain or moderate pain.     ALPRAZolam (XANAX) 0.5 MG tablet Take 0.5 mg by mouth 2 (two) times daily as needed for anxiety.      atorvastatin (LIPITOR) 10 MG tablet Take 1 tablet (10 mg total) by mouth daily. 30 tablet 0   cholecalciferol (VITAMIN D) 1000 UNITS tablet Take 2,000 Units by mouth every morning.     citalopram (CELEXA) 20 MG tablet Take 1 tablet by mouth daily.     clopidogrel (PLAVIX) 75 MG tablet Take 75 mg by mouth daily.     docusate sodium (COLACE) 100 MG capsule Take 100 mg by mouth 2 (two) times daily.     feeding supplement, GLUCERNA SHAKE, (GLUCERNA SHAKE) LIQD Take 237 mLs by mouth daily as needed.      FREESTYLE LITE test strip 1 each 3 (three) times daily.     furosemide (LASIX) 40 MG tablet Take 1.5 tablets (60 mg total) by mouth daily. 135 tablet 3   hydrALAZINE (APRESOLINE) 10 MG tablet TAKE 1 TABLET IN THE MORNING AND AT BEDTIME 180 tablet 3   insulin aspart (NOVOLOG) 100 UNIT/ML FlexPen Inject 5 Units into the skin 3 (three) times daily with meals. When BG is over 100     insulin  glargine (LANTUS) 100 UNIT/ML Solostar Pen Inject 8 Units into the skin at bedtime.      L-Methylfolate-B12-B6-B2 (CEREFOLIN PO) Take 1 tablet by mouth daily.     loratadine (CLARITIN) 10 MG tablet Take 10 mg by mouth daily.     macitentan (OPSUMIT) 10 MG tablet Take 1 tablet (10 mg total) by mouth daily. 90 tablet 1   metoprolol tartrate (LOPRESSOR) 25 MG tablet Take 12.5 mg by mouth 2 (two) times daily. 12.26m in the AM, 12.560min the PM     mirtazapine (REMERON) 30 MG tablet Take 30 mg by mouth at bedtime.     Multiple Vitamin (MULTIVITAMIN) tablet Take 1 tablet by mouth daily. Reported on 02/06/2016     olmesartan (BENICAR) 5 MG tablet TAKE 1 TABLET DAILY 90 tablet 3   pantoprazole (PROTONIX) 40 MG tablet TAKE 1 TABLET TWICE A DAY BEFORE MEALS 180 tablet 3   Polyethyl Glycol-Propyl Glycol (SYSTANE OP) Apply to eye as needed.     potassium chloride (KLOR-CON) 10 MEQ tablet TAKE 1 TABLET DAILY 90 tablet 3   psyllium (METAMUCIL SMOOTH TEXTURE) 28 % packet Take 1 packet by mouth as needed.      sodium chloride (OCEAN) 0.65 % SOLN nasal spray Place 1 spray into both nostrils as needed for congestion.     thyroid (ARMOUR) 15 MG tablet Take 15 mg by mouth daily.     umeclidinium bromide (INCRUSE ELLIPTA) 62.5 MCG/INH AEPB Inhale 1 puff into the lungs daily.     ursodiol (ACTIGALL) 500 MG tablet TAKE 1 TABLET TWICE A DAY WITH MEALS 180 tablet 3   No current facility-administered medications on file prior to visit.    Medications after present encounter   Current Outpatient Medications  Medication Instructions   acetaminophen (TYLENOL) 500 mg, Oral, Every 8 hours PRN   ALPRAZolam (XANAX) 0.5 mg, Oral, 2 times daily PRN   atorvastatin (LIPITOR) 10 mg, Oral, Daily   cholecalciferol (VITAMIN D) 2,000 Units, Oral, Every morning   citalopram (CELEXA) 20 MG tablet 1 tablet, Oral, Daily   clopidogrel (PLAVIX) 75  mg, Oral, Daily   docusate sodium (COLACE) 100 mg, Oral, 2 times daily   feeding  supplement, GLUCERNA SHAKE, (GLUCERNA SHAKE) LIQD 237 mLs, Oral, Daily PRN   FREESTYLE LITE test strip 1 each, 3 times daily   furosemide (LASIX) 60 mg, Oral, Daily   hydrALAZINE (APRESOLINE) 10 MG tablet TAKE 1 TABLET IN THE MORNING AND AT BEDTIME   insulin aspart (NOVOLOG) 5 Units, Subcutaneous, 3 times daily with meals, When BG is over 100    insulin glargine (LANTUS) 8 Units, Subcutaneous, Daily at bedtime   L-Methylfolate-B12-B6-B2 (CEREFOLIN PO) 1 tablet, Oral, Daily   loratadine (CLARITIN) 10 mg, Oral, Daily   macitentan (OPSUMIT) 10 mg, Oral, Daily   metoprolol tartrate (LOPRESSOR) 12.5 mg, Oral, 2 times daily, 12.62m in the AM, 12.569min the PM   mirtazapine (REMERON) 30 mg, Daily at bedtime   Multiple Vitamin (MULTIVITAMIN) tablet 1 tablet, Oral, Daily, Reported on 02/06/2016   olmesartan (BENICAR) 5 MG tablet TAKE 1 TABLET DAILY   pantoprazole (PROTONIX) 40 MG tablet TAKE 1 TABLET TWICE A DAY BEFORE MEALS   Polyethyl Glycol-Propyl Glycol (SYSTANE OP) Ophthalmic, As needed   potassium chloride (KLOR-CON) 10 MEQ tablet TAKE 1 TABLET DAILY   psyllium (METAMUCIL SMOOTH TEXTURE) 28 % packet 1 packet, Oral, As needed   sodium chloride (OCEAN) 0.65 % SOLN nasal spray 1 spray, Each Nare, As needed   thyroid (ARMOUR) 15 mg, Oral, Daily   umeclidinium bromide (INCRUSE ELLIPTA) 62.5 MCG/INH AEPB 1 puff, Inhalation, Daily   ursodiol (ACTIGALL) 500 MG tablet TAKE 1 TABLET TWICE A DAY WITH MEALS    Cardiac Studies:    Right Heart Cath 04/29/2017, RA pressure 8/2, mean 2 mmHg. RV 78/1, EDP 3 mmHg. PA 80/18, mean 42 mmHg. PW 13/14, mean 11 mmHg. PA saturation was 73%, aortic saturation 98%. Cardiac output 4.88, cardiac index 2.85 by Fick.  Echocardiogram 01/03/2020:  1. Normal LV systolic function with visual EF 60-65%. Left ventricle  cavity is normal in size. Normal left ventricular wall thickness. Normal  global wall motion. Doppler evidence of grade II (pseudonormal) diastolic  dysfunction,  elevated LAP. Calculated EF 56%.  2. Left atrial cavity is moderate to severely dilated.  3. Right atrial cavity is moderately dilated.  4. Mild (Grade I) mitral regurgitation.  5. Normal RV size and function.  Moderate tricuspid regurgitation. Moderate pulmonary hypertension. RVSP  measures 54 mmHg.  6. Moderate pulmonic regurgitation.  7. IVC is normal with blunted respiratory response.  8. Prior study dated Echocardiogram 10/21/2018, no significant change.   Carotid artery duplex 08/27/2021:  Duplex suggests stenosis in the right internal carotid artery (total occlusion). Duplex suggests stenosis in the left internal carotid artery (50-69%), upper limit of spectrum. The left PSV internal/common carotid artery ratio of 5.35 is consistent with a stenosis of >70%. Duplex suggests stenosis in the left external carotid artery (<50%). Right vertebral artery flow is not visualized. Antegrade left vertebral artery flow. Comparison from 03/10/2017 not made due to poor quality study.  Previously reported bilateral ICA occlusion. Follow up in 6 months if clinically indicated.  PCV ECHOCARDIOGRAM COMPLETE 08/27/2021  Narrative Echocardiogram 08/27/2021: Left ventricle cavity is normal in size. Mild concentric hypertrophy of the left ventricle. Normal global wall motion. Normal LV systolic function with visual EF 50-55%. Doppler evidence of grade II (pseudonormal) diastolic dysfunction, elevated LAP. Left atrial cavity is moderately dilated. Mildly restricted mitral valve leaflets. Mild (Grade I) mitral regurgitation. Moderate tricuspid regurgitation. Estimated pulmonary artery systolic pressure 45  mmHg. Mild pulmonic regurgitation. Previous study on 01/03/2020 showed mod RA dilatation, estimated PASP 54 mmHg.   EKG:  EKG 09/17/2021: Sinus rhythm with first-degree AV block at rate of 61 bpm, PR interval 310 ms, right atrial enlargement.  Normal axis.  Incomplete right bundle branch block.  No  evidence of ischemia. No significant change from EKG 01/22/2021:    SIX MIN WALK 09/17/2021 01/22/2021 07/25/2020 05/24/2020 04/25/2019 04/25/2019  Medications none none - - - -  Supplimental Oxygen during Test? (L/min) _0  -  O2 Flow Rate _1 - -  Type Pulse - Continuous Continuous Continuous -  Laps - - - - 6 6  Laps _2 - -  Partial Lap (in Meters) 1 1 0 0 0 -  Baseline Heartrate 96 81 68 73 69 -  Baseline SPO2 70 98 100 94 94 -  2 Minute Oxygen Saturation % 94 95 99 93 - -  2 Minute HR 81 97 103 86 - -  4 Minute Oxygen Saturation % 90 95 94 92 - -  4 Minute HR 93 116 114 92 - -  6 Minute Oxygen Saturation % 92 95 93 93 - -  6 Minute HR 98 124 119 92 - -  Heartrate 82 - 118 - 102 -  SPO2 94 - 92 - 91 -  BP (sitting) 206/85 - - - - -  Heartrate 82 84 - - 83 -  SPO2 95 95 - - 92 -  Stopped or Paused before Six Minutes _3  -  Interpretation - - - Hip pain - -  Distance Completed - - - - 204 -  Distance Completed 221 221 120 100 - -  Tech Comments: Patient did not stop during her 6 minute walk test. Patient walked all the way through the 6 minutes without stopping. Daughter stated [name] has been practicing her 6 minute walk at home. Patient did not take a break during the walk. Patient did not stop at all, but walked a a very slow pace. pt walked 6 laps in 6 minutes -    Assessment     ICD-10-CM   1. Primary pulmonary hypertension (HCC)  I27.0 EKG 12-Lead    2. Diastolic CHF, chronic (HCC)  I50.32     3. Essential hypertension  I10        No orders of the defined types were placed in this encounter.  Medications Discontinued During This Encounter  Medication Reason   dextromethorphan-guaiFENesin (MUCINEX DM) 30-600 MG 12hr tablet Error     Orders Placed This Encounter  Procedures   EKG 12-Lead      Recommendations:   LATONYA NELON  is a 87 y.o. AAF patient with primary pulmonary hypertension,  cryptogenic cirrhosis of the liver, presently  on Opsumit since July 2018, did not tolerate Adcirca or Sildanafil due to visual side effects. [name] is also being followed at [facility] by Dr. Suzi Roots, last seen in July 2021 in November 2018 recommended continuing present medical therapy in view of advanced age and other underlying comorbidity difficult to delineate 6-minute walk test was appropriate.  [name] has history of breast cancer status post radical mastectomy and chemotherapy, well controlled diabetes mellitus, moderate coronary artery disease and bilateral carotid artery occlusion without stroke and is on chronic plavix.   [name] is remained stable without clinical evidence of acute decompensated right-sided heart failure.  Since last office visit  3 months ago, [name] has not had any increasing oxygen needs.  I reviewed her 6-minute walk test, her walking distance is increased.  [name] also did not stop throughout the 6-minute walk test.  I reviewed her carotid artery duplex, in view of advanced age, multiple medical comorbidity, I do not think we need to continue surveillance for now.  Continue Plavix for now.  With regard to pulmonary hypertension, [name] has remained very stable and there is no clinical evidence of heart failure today, echocardiogram reviewed, PA pressures has decreased.  With regard to systemic hypertension, blood pressure at home has been well controlled, slightly elevated blood pressure, in view of advanced age, would not make any changes as he has done very well and has remained clinically very stable.  I will see her back in 6 months for follow-up.     Adrian Prows, MD, [facility] 09/17/2021, 2:56 PM Office: [phone]

## 2021-09-20 DIAGNOSIS — I10 Essential (primary) hypertension: Secondary | ICD-10-CM | POA: Diagnosis not present

## 2021-09-24 ENCOUNTER — Encounter: Payer: Self-pay | Admitting: Gastroenterology

## 2021-09-24 ENCOUNTER — Telehealth: Payer: Self-pay | Admitting: *Deleted

## 2021-09-24 ENCOUNTER — Telehealth (INDEPENDENT_AMBULATORY_CARE_PROVIDER_SITE_OTHER): Payer: Medicare Other | Admitting: Gastroenterology

## 2021-09-24 ENCOUNTER — Other Ambulatory Visit: Payer: Self-pay

## 2021-09-24 DIAGNOSIS — R1310 Dysphagia, unspecified: Secondary | ICD-10-CM | POA: Diagnosis not present

## 2021-09-24 DIAGNOSIS — K7460 Unspecified cirrhosis of liver: Secondary | ICD-10-CM | POA: Diagnosis not present

## 2021-09-24 NOTE — Telephone Encounter (Signed)
[name], you are scheduled for a virtual visit with your provider today.  Just as we do with appointments in the office, we must obtain your consent to participate.  Your consent will be active for this visit and any virtual visit you may have with one of our providers in the next 365 days.  If you have a MyChart account, I can also send a copy of this consent to you electronically.  All virtual visits are billed to your insurance company just like a traditional visit in the office.  As this is a virtual visit, video technology does not allow for your provider to perform a traditional examination.  This may limit your provider's ability to fully assess your condition.  If your provider identifies any concerns that need to be evaluated in person or the need to arrange testing such as labs, EKG, etc, we will make arrangements to do so.  Although advances in technology are sophisticated, we cannot ensure that it will always work on either your end or our end.  If the connection with a video visit is poor, we may have to switch to a telephone visit.  With either a video or telephone visit, we are not always able to ensure that we have a secure connection.   I need to obtain your verbal consent now.   Are you willing to proceed with your visit today?

## 2021-09-24 NOTE — Progress Notes (Signed)
Primary Care Physician:  [name], DO  Primary GI: Dr Abbey Chatters  Patient Location: Home   Provider Location: [facility] office   Reason for Visit: Follow-up    Persons present on the virtual encounter, with roles: Patient, daughter, NP    Total time (minutes) spent on medical discussion: 12 minutes   Due to COVID-19, visit was conducted using virtual method.  Visit was requested by patient.  Virtual Visit via Telephone Note Due to COVID-19, visit is conducted virtually and was requested by patient.   I connected with [name] on 09/24/21 at  1:30 PM EST by telephone and verified that I am speaking with the correct person using two identifiers.   I discussed the limitations, risks, security and privacy concerns of performing an evaluation and management service by telephone and the availability of in person appointments. I also discussed with the patient that there may be a patient responsible charge related to this service. The patient expressed understanding and agreed to proceed.  Chief Complaint  Patient presents with   Cirrhosis    F/u   Gastroesophageal Reflux    A little reflux but no other trouble      History of Present Illness: 87 y.o. Chambers presenting today with a history of PBC, cirrhosis, GERD, dysphagia. Last EGD in Feb 2018 with normal esophagus and gastritis. History of PUD in 2017 due to NSAIDs. Mildly elevated AFP historically, with MRI in 2018 negative for hematoma.  Good appetite. No abdominal pain. Notes pill dysphagia every now and then. Beans with hulls are difficult. Uses a grinder to grind meats. No overt GI bleeding. No constipation or diarrhea. Takes probiotic, metamucil, and stool softeners daily. No jaundice or pruritis. No mental status changes or confusion.    Past Medical History:  Diagnosis Date   Acute delirium 02/22/6758   Acute diastolic congestive heart failure ([place]) 03/21/2014   Anxiety    Cancer ([place])    Carotid artery disease ([facility])     Bilateral ICA occlusion   Chronic headaches    Chronic liver disease    ?cirrhosis on 10/2014 CT. H/O elevated alkphos and +AMA on URSO.   Coronary atherosclerosis of native coronary artery    Mild nonobstructive disease at cardiac catheterization May 2011   Depression    Essential hypertension, benign    Fracture of femoral neck, right, closed ([place]) 03/28/2014   History of breast cancer    History of GI bleed    Hyperlipidemia    Orthostatic hypotension    Diabetic polyneuropathy/diabetic dysautonomia    Osteopenia    Parotid tumor    Pneumonia 11/2016   Pneumonia    Pressure ulcer, heel, right, unstageable ([place]) 04/17/2014   PSVT (paroxysmal supraventricular tachycardia) ([place])    Syncope and collapse 04/15/2010   Qualifier: Diagnosis of  By: Angelena Form, MD, Christopher     Type 2 diabetes mellitus ([place])    UPPER GASTROINTESTINAL HEMORRHAGE 03/21/2007   Qualifier: Diagnosis of  By: Jonna Munro MD, Roderic Scarce       Past Surgical History:  Procedure Laterality Date   ABDOMINAL HYSTERECTOMY     APPENDECTOMY     CATARACT EXTRACTION Bilateral    Dr. Gershon Crane   CHOLECYSTECTOMY  2008   COLONOSCOPY  2010   Dr. Oneida Alar: single tubular adenoma, one hyperplastic polyp. Next TCS 2020 health permitting.   ESOPHAGOGASTRODUODENOSCOPY N/A 08/17/2016   web in upper third of esophagus s/p dilation, five gastric ulcers without bleeding   ESOPHAGOGASTRODUODENOSCOPY N/A 01/01/2017  normal esophagus, gastritis   HEMORRHOID SURGERY  1960s   HIP ARTHROPLASTY Right 03/28/2014   Procedure: ARTHROPLASTY BIPOLAR HIP;  Surgeon: Carole Civil, MD;  Location: [facility];  Service: Orthopedics;  Laterality: Right;   LAPAROSCOPIC APPENDECTOMY N/A 04/13/2013   Procedure: APPENDECTOMY LAPAROSCOPIC;  Surgeon: Donato Heinz, MD;  Location: [facility];  Service: General;  Laterality: N/A;   MASTECTOMY  1994   Left breast -related to breast cancer   RIGHT HEART CATH N/A 04/29/2017   Procedure: Right Heart Cath;   Surgeon: Adrian Prows, MD;  Location: [place] [facility];  Service: Cardiovascular;  Laterality: N/A;   RIGHT/LEFT HEART CATH AND CORONARY ANGIOGRAPHY N/A 04/13/2017   Procedure: RIGHT/LEFT HEART CATH AND CORONARY ANGIOGRAPHY;  Surgeon: Adrian Prows, MD;  Location: [place] [facility];  Service: Cardiovascular;  Laterality: N/A;   SAVORY DILATION N/A 08/17/2016   Procedure: SAVORY DILATION;  Surgeon: Danie Binder, MD;  Location: [facility];  Service: Endoscopy;  Laterality: N/A;   VESICOVAGINAL FISTULA CLOSURE W/ TAH  4970   YAG LASER APPLICATION Left    Dr. Zadie Rhine     Current Meds  Medication Sig   acetaminophen (TYLENOL) 500 MG tablet Take 500 mg by mouth every 8 (eight) hours as needed for mild pain or moderate pain.   ALPRAZolam (XANAX) 0.5 MG tablet Take 0.5 mg by mouth 2 (two) times daily as needed for anxiety.    atorvastatin (LIPITOR) 10 MG tablet Take 1 tablet (10 mg total) by mouth daily.   cholecalciferol (VITAMIN D) 1000 UNITS tablet Take 2,000 Units by mouth every morning.   citalopram (CELEXA) 20 MG tablet Take 1 tablet by mouth daily.   clopidogrel (PLAVIX) 75 MG tablet Take 75 mg by mouth daily.   docusate sodium (COLACE) 100 MG capsule Take 100 mg by mouth 2 (two) times daily.   feeding supplement, GLUCERNA SHAKE, (GLUCERNA SHAKE) LIQD Take 237 mLs by mouth daily as needed.    FREESTYLE LITE test strip 1 each 3 (three) times daily.   furosemide (LASIX) 40 MG tablet Take 1.5 tablets (60 mg total) by mouth daily.   hydrALAZINE (APRESOLINE) 10 MG tablet TAKE 1 TABLET IN THE MORNING AND AT BEDTIME   insulin aspart (NOVOLOG) 100 UNIT/ML FlexPen Inject 5 Units into the skin 3 (three) times daily with meals. When BG is over 100   insulin glargine (LANTUS) 100 UNIT/ML Solostar Pen Inject 8 Units into the skin at bedtime.    L-Methylfolate-B12-B6-B2 (CEREFOLIN PO) Take 1 tablet by mouth daily.   loratadine (CLARITIN) 10 MG tablet Take 10 mg by mouth daily.   macitentan (OPSUMIT) 10  MG tablet Take 1 tablet (10 mg total) by mouth daily.   metoprolol tartrate (LOPRESSOR) 25 MG tablet Take 12.5 mg by mouth 2 (two) times daily. 12.30m in the AM, 12.5100min the PM   mirtazapine (REMERON) 30 MG tablet Take 30 mg by mouth at bedtime.   Multiple Vitamin (MULTIVITAMIN) tablet Take 1 tablet by mouth daily. Reported on 02/06/2016   olmesartan (BENICAR) 5 MG tablet TAKE 1 TABLET DAILY   pantoprazole (PROTONIX) 40 MG tablet TAKE 1 TABLET TWICE A DAY BEFORE MEALS   Polyethyl Glycol-Propyl Glycol (SYSTANE OP) Apply to eye as needed.   potassium chloride (KLOR-CON) 10 MEQ tablet TAKE 1 TABLET DAILY   psyllium (METAMUCIL SMOOTH TEXTURE) 28 % packet Take 1 packet by mouth as needed.    sodium chloride (OCEAN) 0.65 % SOLN nasal spray Place 1 spray  into both nostrils as needed for congestion.   thyroid (ARMOUR) 15 MG tablet Take 15 mg by mouth daily.   umeclidinium bromide (INCRUSE ELLIPTA) Michele.5 MCG/INH AEPB Inhale 1 puff into the lungs daily.   ursodiol (ACTIGALL) 500 MG tablet TAKE 1 TABLET TWICE A DAY WITH MEALS     Family History  Problem Relation Age of Onset   Heart failure Mother    Kidney cancer Mother    Cancer Mother    Hypertension Mother    Lung cancer Father        Brain METS   Cancer Father    Cirrhosis Brother    Kidney cancer Sister    Cancer Sister    Hypertension Sister    Diabetes Son    Heart attack Neg Hx    Stroke Neg Hx     Social History   Socioeconomic History   Marital status: Divorced    Spouse name: Not on file   Number of children: 4   Years of education: Not on file   Highest education level: Not on file  Occupational History   Occupation: RETIRED FACTORY WORKER    Comment: [employer]  Tobacco Use   Smoking status: Former    Packs/day: 0.50    Years: 40.00    Pack years: 20.00    Types: Cigarettes    Quit date: 03/30/2013    Years since quitting: 8.4   Smokeless tobacco: Never   Tobacco comments:    Quit since 03/2013  Vaping Use    Vaping Use: Never used  Substance and Sexual Activity   Alcohol use: No    Alcohol/week: 0.0 standard drinks   Drug use: No   Sexual activity: Never  Other Topics Concern   Not on file  Social History Narrative   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 Started age 34.   Drug use -no   Alcohol use - no   Lives with son   Education - 12 th grade                  Social Determinants of Health   Financial Resource Strain: Not on file  Food Insecurity: Not on file  Transportation Needs: Not on file  Physical Activity: Not on file  Stress: Not on file  Social Connections: Not on file       Review of Systems: Gen: Denies fever, chills, anorexia. Denies fatigue, weakness, weight loss.  CV: Denies chest pain, palpitations, syncope, peripheral edema, and claudication. Resp: Denies dyspnea at rest, cough, wheezing, coughing up blood, and pleurisy. GI: see HPI Derm: Denies rash, itching, dry skin Psych: Denies depression, anxiety, memory loss, confusion. No homicidal or suicidal ideation.  Heme: Denies bruising, bleeding, and enlarged lymph nodes.  Observations/Objective: No distress. Unable to perform physical exam due to telephone encounter. No video available.   Assessment and Plan: 87 y.o. Chambers presenting today with a history of PBC, cirrhosis, GERD, dysphagia. Last EGD in Feb 2018 with normal esophagus and gastritis. History of PUD in 2017 due to NSAIDs. Mildly elevated AFP historically, with MRI in 2018 negative for hematoma.   Cirrhosis: well-compensated. Labs recently done through PCP with normal LFTs. Hgb 11.9, Hct 35.9. Platelets 163. Will recheck AFP now. If remains elevated, will pursue MRI instead of [place].  GERD well-controlled on PPI daily.   Dysphagia: different textures related to foods and pills. She  is hesitant to pursue EGD. Will pursue BPE.   6 month return regardless.   Follow Up Instructions:    I discussed the assessment and treatment plan with the patient. The patient was provided an opportunity to ask questions and all were answered. The patient agreed with the plan and demonstrated an understanding of the instructions.   The patient was advised to call back or seek an in-person evaluation if the symptoms worsen or if the condition fails to improve as anticipated.  I provided 12 minutes of face-to-face time during this MyChart Video encounter.  Annitta Needs, PhD, ANP-BC [facility]

## 2021-09-24 NOTE — Patient Instructions (Signed)
Please have the blood work completed when you are able.   I have ordered a barium esophagram to further check your esophagus.   We will see you in 6 months!   I enjoyed talking with you again today! As you know, I value our relationship and want to provide genuine, compassionate, and quality care. I welcome your feedback. If you receive a survey regarding your visit,  I greatly appreciate you taking time to fill this out. See you next time!  Annitta Needs, PhD, ANP-BC [facility]

## 2021-09-24 NOTE — Telephone Encounter (Signed)
Pt consented to a virtual visit.

## 2021-10-01 DIAGNOSIS — K7460 Unspecified cirrhosis of liver: Secondary | ICD-10-CM | POA: Diagnosis not present

## 2021-10-02 LAB — AFP TUMOR MARKER: AFP-Tumor Marker: 9.5 ng/mL — ABNORMAL HIGH

## 2021-10-07 ENCOUNTER — Other Ambulatory Visit: Payer: Self-pay

## 2021-10-07 DIAGNOSIS — R772 Abnormality of alphafetoprotein: Secondary | ICD-10-CM

## 2021-10-13 DIAGNOSIS — M79675 Pain in left toe(s): Secondary | ICD-10-CM | POA: Diagnosis not present

## 2021-10-13 DIAGNOSIS — M79674 Pain in right toe(s): Secondary | ICD-10-CM | POA: Diagnosis not present

## 2021-10-13 DIAGNOSIS — B351 Tinea unguium: Secondary | ICD-10-CM | POA: Diagnosis not present

## 2021-10-13 DIAGNOSIS — E1151 Type 2 diabetes mellitus with diabetic peripheral angiopathy without gangrene: Secondary | ICD-10-CM | POA: Diagnosis not present

## 2021-10-20 ENCOUNTER — Other Ambulatory Visit: Payer: Self-pay

## 2021-10-20 ENCOUNTER — Ambulatory Visit (HOSPITAL_COMMUNITY)
Admission: RE | Admit: 2021-10-20 | Discharge: 2021-10-20 | Disposition: A | Discharge status: Home / Self Care | Payer: Medicare Other | Source: Ambulatory Visit | Attending: Gastroenterology | Admitting: Gastroenterology

## 2021-10-20 DIAGNOSIS — I10 Essential (primary) hypertension: Secondary | ICD-10-CM | POA: Diagnosis not present

## 2021-10-20 DIAGNOSIS — K862 Cyst of pancreas: Secondary | ICD-10-CM | POA: Diagnosis not present

## 2021-10-20 DIAGNOSIS — R772 Abnormality of alphafetoprotein: Secondary | ICD-10-CM | POA: Diagnosis not present

## 2021-10-20 DIAGNOSIS — Z9049 Acquired absence of other specified parts of digestive tract: Secondary | ICD-10-CM | POA: Diagnosis not present

## 2021-10-20 DIAGNOSIS — Z853 Personal history of malignant neoplasm of breast: Secondary | ICD-10-CM | POA: Diagnosis not present

## 2021-10-20 DIAGNOSIS — N281 Cyst of kidney, acquired: Secondary | ICD-10-CM | POA: Diagnosis not present

## 2021-10-20 MED ORDER — GADOBUTROL 1 MMOL/ML IV SOLN
7.0000 mL | Freq: Once | INTRAVENOUS | Status: AC | PRN
  Administered 2021-10-20: 7 mL via INTRAVENOUS

## 2021-10-21 ENCOUNTER — Other Ambulatory Visit: Payer: Self-pay

## 2021-10-21 DIAGNOSIS — R932 Abnormal findings on diagnostic imaging of liver and biliary tract: Secondary | ICD-10-CM

## 2021-10-22 ENCOUNTER — Ambulatory Visit (HOSPITAL_COMMUNITY)
Admission: RE | Admit: 2021-10-22 | Discharge: 2021-10-22 | Disposition: A | Discharge status: Home / Self Care | Payer: Medicare Other | Source: Ambulatory Visit | Attending: Gastroenterology | Admitting: Gastroenterology

## 2021-10-22 ENCOUNTER — Other Ambulatory Visit: Payer: Self-pay

## 2021-10-22 DIAGNOSIS — J811 Chronic pulmonary edema: Secondary | ICD-10-CM | POA: Diagnosis not present

## 2021-10-22 DIAGNOSIS — I517 Cardiomegaly: Secondary | ICD-10-CM | POA: Diagnosis not present

## 2021-10-22 DIAGNOSIS — R932 Abnormal findings on diagnostic imaging of liver and biliary tract: Secondary | ICD-10-CM | POA: Insufficient documentation

## 2021-10-24 ENCOUNTER — Emergency Department (HOSPITAL_COMMUNITY): Payer: Medicare Other

## 2021-10-24 ENCOUNTER — Encounter (HOSPITAL_COMMUNITY): Payer: Self-pay | Admitting: *Deleted

## 2021-10-24 ENCOUNTER — Emergency Department (HOSPITAL_COMMUNITY)
Admission: EM | Admit: 2021-10-24 | Discharge: 2021-10-24 | Disposition: A | Discharge status: Home / Self Care | Payer: Medicare Other | Attending: Emergency Medicine | Admitting: Emergency Medicine

## 2021-10-24 DIAGNOSIS — Z794 Long term (current) use of insulin: Secondary | ICD-10-CM | POA: Diagnosis not present

## 2021-10-24 DIAGNOSIS — Z7901 Long term (current) use of anticoagulants: Secondary | ICD-10-CM | POA: Insufficient documentation

## 2021-10-24 DIAGNOSIS — Z87891 Personal history of nicotine dependence: Secondary | ICD-10-CM | POA: Diagnosis not present

## 2021-10-24 DIAGNOSIS — E039 Hypothyroidism, unspecified: Secondary | ICD-10-CM | POA: Insufficient documentation

## 2021-10-24 DIAGNOSIS — I5031 Acute diastolic (congestive) heart failure: Secondary | ICD-10-CM | POA: Diagnosis not present

## 2021-10-24 DIAGNOSIS — Z96641 Presence of right artificial hip joint: Secondary | ICD-10-CM | POA: Diagnosis not present

## 2021-10-24 DIAGNOSIS — I110 Hypertensive heart disease with heart failure: Secondary | ICD-10-CM | POA: Diagnosis not present

## 2021-10-24 DIAGNOSIS — R0602 Shortness of breath: Secondary | ICD-10-CM | POA: Diagnosis not present

## 2021-10-24 DIAGNOSIS — Z7984 Long term (current) use of oral hypoglycemic drugs: Secondary | ICD-10-CM | POA: Insufficient documentation

## 2021-10-24 DIAGNOSIS — E1142 Type 2 diabetes mellitus with diabetic polyneuropathy: Secondary | ICD-10-CM | POA: Insufficient documentation

## 2021-10-24 DIAGNOSIS — J811 Chronic pulmonary edema: Secondary | ICD-10-CM | POA: Diagnosis not present

## 2021-10-24 DIAGNOSIS — Z91040 Latex allergy status: Secondary | ICD-10-CM | POA: Diagnosis not present

## 2021-10-24 DIAGNOSIS — J189 Pneumonia, unspecified organism: Secondary | ICD-10-CM | POA: Insufficient documentation

## 2021-10-24 DIAGNOSIS — Z853 Personal history of malignant neoplasm of breast: Secondary | ICD-10-CM | POA: Insufficient documentation

## 2021-10-24 DIAGNOSIS — I517 Cardiomegaly: Secondary | ICD-10-CM | POA: Diagnosis not present

## 2021-10-24 MED ORDER — CEFTRIAXONE SODIUM 1 G IJ SOLR
1.0000 g | Freq: Once | INTRAMUSCULAR | Status: AC
  Administered 2021-10-24: 1 g via INTRAMUSCULAR
  Filled 2021-10-24: qty 10

## 2021-10-24 MED ORDER — LIDOCAINE HCL (PF) 1 % IJ SOLN
INTRAMUSCULAR | Status: AC
  Administered 2021-10-24: 2.1 mL
  Filled 2021-10-24: qty 30

## 2021-10-24 MED ORDER — DOXYCYCLINE HYCLATE 100 MG PO TABS
100.0000 mg | ORAL_TABLET | Freq: Once | ORAL | Status: AC
  Administered 2021-10-24: 100 mg via ORAL
  Filled 2021-10-24: qty 1

## 2021-10-24 MED ORDER — DOXYCYCLINE HYCLATE 100 MG PO CAPS: 100.0000 mg | ORAL_CAPSULE | Freq: Two times a day (BID) | ORAL | 0 refills | Status: DC

## 2021-10-24 NOTE — ED Triage Notes (Signed)
RECENTLY DIAGNOSED WITH PNEUMONIA, FAMILY MEMBER CONCERNED SHE MAY NOT RESPOND TO THE LEVAQUIN PRESCRIBED

## 2021-10-24 NOTE — ED Provider Notes (Signed)
[facility] EMERGENCY DEPARTMENT Provider Note   CSN: [id number] Arrival date & time: 10/24/21  1443     History Chief Complaint  Patient presents with   Pneumonia    [name] is a 87 y.o. female.  Patient was diagnosed with pneumonia the last couple days and was started on Levaquin.  Her daughter stated last time she was on Levaquin it did not cure the pneumonia so she wants the antibiotics changed  The history is provided by the patient and medical records. No language interpreter was used.  Pneumonia This is a new problem. The current episode started more than 2 days ago. The problem occurs constantly. The problem has not changed since onset.Pertinent negatives include no chest pain, no abdominal pain and no headaches. Nothing aggravates the symptoms. Nothing relieves the symptoms.      Past Medical History:  Diagnosis Date   Acute delirium 4/0/9811   Acute diastolic congestive heart failure ([place]) 03/21/2014   Anxiety    Cancer ([place])    Carotid artery disease (HCC)    Bilateral ICA occlusion   Chronic headaches    Chronic liver disease    ?cirrhosis on 10/2014 CT. H/O elevated alkphos and +AMA on URSO.   Coronary atherosclerosis of native coronary artery    Mild nonobstructive disease at cardiac catheterization May 2011   Depression    Essential hypertension, benign    Fracture of femoral neck, right, closed ([place]) 03/28/2014   History of breast cancer    History of GI bleed    Hyperlipidemia    Orthostatic hypotension    Diabetic polyneuropathy/diabetic dysautonomia    Osteopenia    Parotid tumor    Pneumonia 11/2016   Pneumonia    Pressure ulcer, heel, right, unstageable ([place]) 04/17/2014   PSVT (paroxysmal supraventricular tachycardia) ([place])    Syncope and collapse 04/15/2010   Qualifier: Diagnosis of  By: Angelena Form, MD, Christopher     Type 2 diabetes mellitus ([place])    UPPER GASTROINTESTINAL HEMORRHAGE 03/21/2007   Qualifier: Diagnosis of  By: Jonna Munro MD,  Cornel[place]      Patient Active Problem List   Diagnosis Date Noted   Dyspepsia 04/02/2021   Gastroesophageal reflux disease 09/25/2020   Globus sensation 04/20/2019   Acute on chronic respiratory failure with hypoxia ([place])    CAP (community acquired pneumonia) 07/19/2018   Flatulence/wind 07/06/2018   Pulmonary hypertension ([place]) 04/25/2017   Dysphagia 07/30/2016   Hypothyroid 12/16/2015   Cirrhosis of liver ([place]) 07/17/2015   Congestive heart disease (HCC)    Type 2 diabetes mellitus without complication (HCC)    Diastolic CHF, chronic ([place]) 10/25/2014   Bloating 06/27/2014   SVT (supraventricular tachycardia) ([place]) 04/17/2014   Pressure ulcer, heel, right, unstageable ([place]) 04/17/2014   Anemia 04/17/2014   Fracture of femoral neck, right, closed ([place]) 03/28/2014   Hip fracture ([place]) 91/47/8295   Acute diastolic congestive heart failure ([place]) 03/21/2014   Abnormal carotid duplex scan 02/20/2014   Orthostatic hypotension 02/20/2014   Right sided weakness 02/16/2014   Loss of weight 06/23/2013   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   Leg weakness, bilateral 06/12/2013   PSVT (paroxysmal supraventricular tachycardia) ([place]) 06/12/2013   Chronic cough 11/28/2011   Dyspnea 10/21/2011   H/O tobacco use, presenting hazards to health 10/21/2011   Coronary artery disease involving native coronary artery of native heart with angina pectoris ([place]) 04/15/2010   Type 2 diabetes mellitus ([place]) 07/30/2008   TREMOR, LEFT HAND 05/07/2008   SCAR CONDITION  AND FIBROSIS OF SKIN 03/05/2008   Hyperlipidemia 10/11/2006   Anxiety state 10/11/2006   Depression 10/11/2006   Essential hypertension 10/11/2006   OSTEOPENIA 10/11/2006   NEOPLASM, MALIGNANT, BREAST, HX OF 10/11/2006    Past Surgical History:  Procedure Laterality Date   ABDOMINAL HYSTERECTOMY     APPENDECTOMY     CATARACT EXTRACTION Bilateral    Dr. Gershon Crane   CHOLECYSTECTOMY  2008   COLONOSCOPY  2010   Dr. Oneida Alar: single  tubular adenoma, one hyperplastic polyp. Next TCS 2020 health permitting.   ESOPHAGOGASTRODUODENOSCOPY N/A 08/17/2016   web in upper third of esophagus s/p dilation, five gastric ulcers without bleeding   ESOPHAGOGASTRODUODENOSCOPY N/A 01/01/2017    normal esophagus, gastritis   HEMORRHOID SURGERY  1960s   HIP ARTHROPLASTY Right 03/28/2014   Procedure: ARTHROPLASTY BIPOLAR HIP;  Surgeon: Carole Civil, MD;  Location: [facility];  Service: Orthopedics;  Laterality: Right;   LAPAROSCOPIC APPENDECTOMY N/A 04/13/2013   Procedure: APPENDECTOMY LAPAROSCOPIC;  Surgeon: Donato Heinz, MD;  Location: [facility];  Service: General;  Laterality: N/A;   MASTECTOMY  1994   Left breast -related to breast cancer   RIGHT HEART CATH N/A 04/29/2017   Procedure: Right Heart Cath;  Surgeon: Adrian Prows, MD;  Location: [place] [facility];  Service: Cardiovascular;  Laterality: N/A;   RIGHT/LEFT HEART CATH AND CORONARY ANGIOGRAPHY N/A 04/13/2017   Procedure: RIGHT/LEFT HEART CATH AND CORONARY ANGIOGRAPHY;  Surgeon: Adrian Prows, MD;  Location: [place] [facility] LAB;  Service: Cardiovascular;  Laterality: N/A;   SAVORY DILATION N/A 08/17/2016   Procedure: SAVORY DILATION;  Surgeon: Danie Binder, MD;  Location: [facility] END[facility] SUITE;  Service: Endoscopy;  Laterality: N/A;   VESICOVAGINAL FISTULA CLOSURE W/ TAH  8832   YAG LASER APPLICATION Left    Dr. Zadie Rhine     OB History   No obstetric history on file.     Family History  Problem Relation Age of Onset   Heart failure Mother    Kidney cancer Mother    Cancer Mother    Hypertension Mother    Lung cancer Father        Brain METS   Cancer Father    Cirrhosis Brother    Kidney cancer Sister    Cancer Sister    Hypertension Sister    Diabetes Son    Heart attack Neg Hx    Stroke Neg Hx     Social History   Tobacco Use   Smoking status: Former    Packs/day: 0.50    Years: 40.00    Pack years: 20.00    Types: Cigarettes    Quit date: 03/30/2013    Years since  quitting: 8.5   Smokeless tobacco: Never   Tobacco comments:    Quit since 03/2013  Vaping Use   Vaping Use: Never used  Substance Use Topics   Alcohol use: No    Alcohol/week: 0.0 standard drinks   Drug use: No    Home Medications Prior to Admission medications   Medication Sig Start Date End Date Taking? Authorizing Provider  doxycycline (VIBRAMYCIN) 100 MG capsule Take 1 capsule (100 mg total) by mouth 2 (two) times daily. One po bid x 7 days 10/24/21  Yes Milton Ferguson, MD  acetaminophen (TYLENOL) 500 MG tablet Take 500 mg by mouth every 8 (eight) hours as needed for mild pain or moderate pain.    [provider]  ALPRAZolam Duanne Moron) 0.5 MG tablet Take 0.5 mg by mouth  2 (two) times daily as needed for anxiety.     [provider]  atorvastatin (LIPITOR) 10 MG tablet Take 1 tablet (10 mg total) by mouth daily. 02/20/14   [name], MD  cholecalciferol (VITAMIN D) 1000 UNITS tablet Take 2,000 Units by mouth every morning.    [provider]  citalopram (CELEXA) 20 MG tablet Take 1 tablet by mouth daily. 06/05/18   [provider]  clopidogrel (PLAVIX) 75 MG tablet Take 75 mg by mouth daily.    [provider]  docusate sodium (COLACE) 100 MG capsule Take 100 mg by mouth 2 (two) times daily.    [provider]  feeding supplement, GLUCERNA SHAKE, (GLUCERNA SHAKE) LIQD Take 237 mLs by mouth daily as needed.     [provider]  FREESTYLE LITE test strip 1 each 3 (three) times daily. 03/08/20   [provider]  furosemide (LASIX) 40 MG tablet Take 1.5 tablets (60 mg total) by mouth daily. 02/18/16   Minus Breeding, MD  hydrALAZINE (APRESOLINE) 10 MG tablet TAKE 1 TABLET IN THE MORNING AND AT BEDTIME 12/30/20   Adrian Prows, MD  insulin aspart (NOVOLOG) 100 UNIT/ML FlexPen Inject 5 Units into the skin 3 (three) times daily with meals. When BG is over 100    [provider]  insulin glargine (LANTUS) 100 UNIT/ML  Solostar Pen Inject 8 Units into the skin at bedtime.     [provider]  L-Methylfolate-B12-B6-B2 (CEREFOLIN PO) Take 1 tablet by mouth daily.    [provider]  loratadine (CLARITIN) 10 MG tablet Take 10 mg by mouth daily.    [provider]  macitentan (OPSUMIT) 10 MG tablet Take 1 tablet (10 mg total) by mouth daily. 06/03/21   Adrian Prows, MD  metoprolol tartrate (LOPRESSOR) 25 MG tablet Take 12.5 mg by mouth 2 (two) times daily. 12.33m in the AM, 12.58min the PM 06/10/21   [provider]  mirtazapine (REMERON) 30 MG tablet Take 30 mg by mouth at bedtime.    [provider]  Multiple Vitamin (MULTIVITAMIN) tablet Take 1 tablet by mouth daily. Reported on 02/06/2016    [provider]  olmesartan (BENICAR) 5 MG tablet TAKE 1 TABLET DAILY 06/06/21   GaAdrian ProwsMD  pantoprazole (PROTONIX) 40 MG tablet TAKE 1 TABLET TWICE A DAY BEFORE MEALS Patient taking differently: 40 mg daily. 09/25/20   BoAnnitta NeedsNP  Polyethyl Glycol-Propyl Glycol (SYSTANE OP) Apply to eye as needed.    [provider]  potassium chloride (KLOR-CON) 10 MEQ tablet TAKE 1 TABLET DAILY 04/15/21   GaAdrian ProwsMD  psyllium (METAMUCIL SMOOTH TEXTURE) 28 % packet Take 1 packet by mouth as needed.     [provider]  sodium chloride (OCEAN) 0.65 % SOLN nasal spray Place 1 spray into both nostrils as needed for congestion.    [provider]  thyroid (ARMOUR) 15 MG tablet Take 15 mg by mouth daily.    [provider]  umeclidinium bromide (INCRUSE ELLIPTA) 62.5 MCG/INH AEPB Inhale 1 puff into the lungs daily.    [provider]  ursodiol (ACTIGALL) 500 MG tablet TAKE 1 TABLET TWICE A DAY WITH MEALS 02/21/21   BoAnnitta NeedsNP    Allergies    Aricept [dReather Littlercl], Namenda [memantine hcl], Adcirca [tadalafil], Donepezil, Spironolactone, Codeine, Indocin [indomethacin], Latex, and Penicillins  Review of Systems   Review of  Systems  Constitutional:  Negative for appetite change and fatigue.  HENT:  Negative for congestion, ear discharge and sinus pressure.   Eyes:  Negative for discharge.  Respiratory:  Positive for cough.   Cardiovascular:  Negative for chest pain.  Gastrointestinal:  Negative for abdominal pain and diarrhea.  Genitourinary:  Negative for frequency and hematuria.  Musculoskeletal:  Negative for back pain.  Skin:  Negative for rash.  Neurological:  Negative for seizures and headaches.  Psychiatric/Behavioral:  Negative for hallucinations.    Physical Exam Updated Vital Signs BP (!) 153/58   Pulse (!) 50   Temp 99.2 F (37.3 C) (Oral)   Resp 18   SpO2 100%   Physical Exam Vitals and nursing note reviewed.  Constitutional:      Appearance: She is well-developed.  HENT:     Head: Normocephalic.     Nose: Nose normal.  Eyes:     General: No scleral icterus.    Conjunctiva/sclera: Conjunctivae normal.  Neck:     Thyroid: No thyromegaly.  Cardiovascular:     Rate and Rhythm: Normal rate and regular rhythm.     Heart sounds: No murmur heard.   No friction rub. No gallop.  Pulmonary:     Breath sounds: No stridor. No wheezing or rales.  Chest:     Chest wall: No tenderness.  Abdominal:     General: There is no distension.     Tenderness: There is no abdominal tenderness. There is no rebound.  Musculoskeletal:        General: Normal range of motion.     Cervical back: Neck supple.  Lymphadenopathy:     Cervical: No cervical adenopathy.  Skin:    Findings: No erythema or rash.  Neurological:     Mental Status: She is alert and oriented to person, place, and time.     Motor: No abnormal muscle tone.     Coordination: Coordination normal.  Psychiatric:        Behavior: Behavior normal.    ED Results / Procedures / Treatments   Labs (all labs ordered are listed, but only abnormal results are displayed) Labs Reviewed - No data to display  EKG None  Radiology DG  Chest 2 View  Result Date: 10/24/2021 CLINICAL DATA:  Shortness of breath. EXAM: CHEST - 2 VIEW COMPARISON:  October 22, 2021. FINDINGS: Mild cardiomegaly is noted with central pulmonary vascular congestion. Bilateral pulmonary edema is noted. Right basilar atelectasis or edema is noted. No pneumothorax pleural effusion is noted. Bony thorax is unremarkable. IMPRESSION: Mild cardiomegaly with central pulmonary vascular congestion and probable bilateral pulmonary edema. Right basilar pneumonia or atelectasis is noted. Electronically Signed   By: Marijo Conception M.D.   On: 10/24/2021 16:09    Procedures Procedures   Medications Ordered in ED Medications  cefTRIAXone (ROCEPHIN) injection 1 g (1 g Intramuscular Given 10/24/21 1637)  doxycycline (VIBRA-TABS) tablet 100 mg (100 mg Oral Given 10/24/21 1636)  lidocaine (PF) (XYLOCAINE) 1 % injection (2.1 mLs  Given 10/24/21 1638)    ED Course  I have reviewed the triage vital signs and the nursing notes.  Pertinent labs & imaging results that were available during my care of the patient were reviewed by me and considered in my medical decision making (see chart for details).    MDM Rules/Calculators/A&P                           Patient nontoxic.  She is not hypoxic on her normal oxygen.  Chest x-ray does show right lower lobe pneumonia.  We will change the antibiotic from Levaquin to doxycycline because the daughter stated that Levaquin does not usually help her.  And she will follow-up with her family doctor Final Clinical Impression(s) / ED Diagnoses Final diagnoses:  Community acquired pneumonia, unspecified laterality    Rx / DC Orders ED Discharge Orders          Ordered    doxycycline (VIBRAMYCIN) 100 MG capsule  2 times daily        10/24/21 1731             Milton Ferguson, MD 10/24/21 1736

## 2021-10-24 NOTE — Discharge Instructions (Signed)
Stop taking the Levaquin and start taking the doxycycline.  Follow-up with your doctor next week for recheck

## 2021-10-29 DIAGNOSIS — J189 Pneumonia, unspecified organism: Secondary | ICD-10-CM | POA: Diagnosis not present

## 2021-11-20 DIAGNOSIS — I10 Essential (primary) hypertension: Secondary | ICD-10-CM | POA: Diagnosis not present

## 2021-12-03 ENCOUNTER — Other Ambulatory Visit (HOSPITAL_COMMUNITY): Payer: Self-pay | Admitting: Family Medicine

## 2021-12-03 ENCOUNTER — Ambulatory Visit (HOSPITAL_COMMUNITY)
Admission: RE | Admit: 2021-12-03 | Discharge: 2021-12-03 | Disposition: A | Discharge status: Home / Self Care | Payer: Medicare Other | Source: Ambulatory Visit | Attending: Family Medicine | Admitting: Family Medicine

## 2021-12-03 ENCOUNTER — Other Ambulatory Visit: Payer: Self-pay

## 2021-12-03 DIAGNOSIS — J449 Chronic obstructive pulmonary disease, unspecified: Secondary | ICD-10-CM | POA: Diagnosis not present

## 2021-12-03 DIAGNOSIS — J189 Pneumonia, unspecified organism: Secondary | ICD-10-CM | POA: Diagnosis not present

## 2021-12-03 DIAGNOSIS — I509 Heart failure, unspecified: Secondary | ICD-10-CM | POA: Diagnosis not present

## 2021-12-03 DIAGNOSIS — J439 Emphysema, unspecified: Secondary | ICD-10-CM | POA: Diagnosis not present

## 2021-12-03 DIAGNOSIS — J811 Chronic pulmonary edema: Secondary | ICD-10-CM | POA: Diagnosis not present

## 2021-12-08 ENCOUNTER — Ambulatory Visit (HOSPITAL_COMMUNITY)
Admission: RE | Admit: 2021-12-08 | Discharge: 2021-12-08 | Disposition: A | Discharge status: Home / Self Care | Payer: Medicare Other | Source: Ambulatory Visit | Attending: Family Medicine | Admitting: Family Medicine

## 2021-12-08 ENCOUNTER — Other Ambulatory Visit (HOSPITAL_COMMUNITY): Payer: Self-pay | Admitting: Family Medicine

## 2021-12-08 ENCOUNTER — Other Ambulatory Visit: Payer: Self-pay

## 2021-12-08 ENCOUNTER — Other Ambulatory Visit: Payer: Self-pay | Admitting: Family Medicine

## 2021-12-08 ENCOUNTER — Encounter (HOSPITAL_COMMUNITY): Payer: Self-pay | Admitting: Radiology

## 2021-12-08 DIAGNOSIS — I5030 Unspecified diastolic (congestive) heart failure: Secondary | ICD-10-CM | POA: Insufficient documentation

## 2021-12-08 DIAGNOSIS — R918 Other nonspecific abnormal finding of lung field: Secondary | ICD-10-CM | POA: Diagnosis not present

## 2021-12-08 DIAGNOSIS — I700 Atherosclerosis of aorta: Secondary | ICD-10-CM | POA: Diagnosis not present

## 2021-12-08 DIAGNOSIS — J9811 Atelectasis: Secondary | ICD-10-CM | POA: Diagnosis not present

## 2021-12-08 DIAGNOSIS — J189 Pneumonia, unspecified organism: Secondary | ICD-10-CM | POA: Insufficient documentation

## 2021-12-08 DIAGNOSIS — I272 Pulmonary hypertension, unspecified: Secondary | ICD-10-CM

## 2021-12-08 DIAGNOSIS — J439 Emphysema, unspecified: Secondary | ICD-10-CM | POA: Diagnosis not present

## 2021-12-08 DIAGNOSIS — R911 Solitary pulmonary nodule: Secondary | ICD-10-CM | POA: Diagnosis not present

## 2021-12-08 LAB — POCT I-STAT CREATININE: Creatinine, Ser: 1.1 mg/dL — ABNORMAL HIGH (ref 0.44–1.00)

## 2021-12-08 MED ORDER — IOHEXOL 300 MG/ML  SOLN
100.0000 mL | Freq: Once | INTRAMUSCULAR | Status: AC | PRN
  Administered 2021-12-08: 75 mL via INTRAVENOUS

## 2021-12-09 ENCOUNTER — Other Ambulatory Visit: Payer: Self-pay

## 2021-12-09 DIAGNOSIS — N289 Disorder of kidney and ureter, unspecified: Secondary | ICD-10-CM

## 2021-12-11 ENCOUNTER — Telehealth: Payer: Self-pay | Admitting: *Deleted

## 2021-12-11 ENCOUNTER — Other Ambulatory Visit: Payer: Self-pay | Admitting: Gastroenterology

## 2021-12-11 DIAGNOSIS — K743 Primary biliary cirrhosis: Secondary | ICD-10-CM

## 2021-12-11 MED ORDER — URSODIOL 500 MG PO TABS: 500.0000 mg | ORAL_TABLET | Freq: Two times a day (BID) | ORAL | 3 refills | Status: DC

## 2021-12-11 NOTE — Telephone Encounter (Signed)
Received request for refill Ursodiol 518m. Take 1 tablet twice daily with meals. 90 day supply. Send to [facility].

## 2021-12-11 NOTE — Telephone Encounter (Signed)
Rx sent 

## 2021-12-12 NOTE — Telephone Encounter (Signed)
Note:

## 2021-12-14 ENCOUNTER — Encounter (HOSPITAL_COMMUNITY): Payer: Self-pay

## 2021-12-14 ENCOUNTER — Other Ambulatory Visit: Payer: Self-pay

## 2021-12-14 ENCOUNTER — Emergency Department (HOSPITAL_COMMUNITY): Payer: Medicare Other

## 2021-12-14 ENCOUNTER — Emergency Department (HOSPITAL_COMMUNITY)
Admission: EM | Admit: 2021-12-14 | Discharge: 2021-12-14 | Disposition: A | Discharge status: Home / Self Care | Payer: Medicare Other | Attending: Emergency Medicine | Admitting: Emergency Medicine

## 2021-12-14 DIAGNOSIS — I2510 Atherosclerotic heart disease of native coronary artery without angina pectoris: Secondary | ICD-10-CM | POA: Diagnosis not present

## 2021-12-14 DIAGNOSIS — I110 Hypertensive heart disease with heart failure: Secondary | ICD-10-CM | POA: Insufficient documentation

## 2021-12-14 DIAGNOSIS — Z794 Long term (current) use of insulin: Secondary | ICD-10-CM | POA: Diagnosis not present

## 2021-12-14 DIAGNOSIS — I509 Heart failure, unspecified: Secondary | ICD-10-CM | POA: Insufficient documentation

## 2021-12-14 DIAGNOSIS — J189 Pneumonia, unspecified organism: Secondary | ICD-10-CM | POA: Diagnosis not present

## 2021-12-14 DIAGNOSIS — Z7902 Long term (current) use of antithrombotics/antiplatelets: Secondary | ICD-10-CM | POA: Insufficient documentation

## 2021-12-14 DIAGNOSIS — R0602 Shortness of breath: Secondary | ICD-10-CM | POA: Diagnosis not present

## 2021-12-14 DIAGNOSIS — I10 Essential (primary) hypertension: Secondary | ICD-10-CM | POA: Diagnosis not present

## 2021-12-14 DIAGNOSIS — Z91040 Latex allergy status: Secondary | ICD-10-CM | POA: Insufficient documentation

## 2021-12-14 DIAGNOSIS — R059 Cough, unspecified: Secondary | ICD-10-CM | POA: Diagnosis not present

## 2021-12-14 LAB — BASIC METABOLIC PANEL
Anion gap: 6 (ref 5–15)
BUN: 31 mg/dL — ABNORMAL HIGH (ref 8–23)
CO2: 31 mmol/L (ref 22–32)
Calcium: 9.2 mg/dL (ref 8.9–10.3)
Chloride: 98 mmol/L (ref 98–111)
Creatinine, Ser: 1.12 mg/dL — ABNORMAL HIGH (ref 0.44–1.00)
GFR, Estimated: 47 mL/min — ABNORMAL LOW (ref >60)
Glucose, Bld: 235 mg/dL — ABNORMAL HIGH (ref 70–99)
Potassium: 4.7 mmol/L (ref 3.5–5.1)
Sodium: 135 mmol/L (ref 135–145)

## 2021-12-14 LAB — CBC WITH DIFFERENTIAL/PLATELET
Abs Immature Granulocytes: 0.01 10*3/uL (ref 0.00–0.07)
Basophils Absolute: 0.1 10*3/uL (ref 0.0–0.1)
Basophils Relative: 2 %
Eosinophils Absolute: 0 10*3/uL (ref 0.0–0.5)
Eosinophils Relative: 1 %
HCT: 39.3 % (ref 36.0–46.0)
Hemoglobin: 12.5 g/dL (ref 12.0–15.0)
Immature Granulocytes: 0 %
Lymphocytes Relative: 31 %
Lymphs Abs: 0.9 10*3/uL (ref 0.7–4.0)
MCH: 33.4 pg (ref 26.0–34.0)
MCHC: 31.8 g/dL (ref 30.0–36.0)
MCV: 105.1 fL — ABNORMAL HIGH (ref 80.0–100.0)
Monocytes Absolute: 0.2 10*3/uL (ref 0.1–1.0)
Monocytes Relative: 6 %
Neutro Abs: 1.8 10*3/uL (ref 1.7–7.7)
Neutrophils Relative %: 60 %
Platelets: 173 10*3/uL (ref 150–400)
RBC: 3.74 MIL/uL — ABNORMAL LOW (ref 3.87–5.11)
RDW: 13 % (ref 11.5–15.5)
WBC: 3 10*3/uL — ABNORMAL LOW (ref 4.0–10.5)
nRBC: 0 % (ref 0.0–0.2)

## 2021-12-14 NOTE — ED Triage Notes (Signed)
Pt c/o shortness of breath. Pt is on 3 lpm continuous at home and became hypoxic at 84%. Rate was increased to 4lpm and oxygen was 95%. Pt has a hx of pneumonia and is still on Abt.

## 2021-12-14 NOTE — ED Provider Notes (Signed)
[name]   CSN: [id number] Arrival date & time: 12/14/21  1208     History  Chief Complaint  Patient presents with   Shortness of Breath    [name] is a 88 y.o. female.  HPI  88 year old female with past medical history of CAD, CHF, HTN, HLD, chronic pneumonia currently on clarithromycin being followed as an outpatient presents emergency department accompanied by daughter for concern of an episode of hypoxia.  Patient has been taking antibiotic as directed as an outpatient.  Feeling fatigued but otherwise baseline.  This morning when she got up she had an episode of hypoxia on her portable oximeter down to 86%.  The daughter states she was not in any distress.  There is no complaint of chest pain.  She is had an intermittently productive cough along with the diagnosis of pneumonia but denies any hemoptysis.  No fever.  No swelling of her lower extremities.  She is otherwise been compliant with her medications.  Currently the patient admits to feeling fatigued but has no other acute complaints.  Home Medications Prior to Admission medications   Medication Sig Start Date End Date Taking? Authorizing Provider  acetaminophen (TYLENOL) 500 MG tablet Take 500 mg by mouth every 8 (eight) hours as needed for mild pain or moderate pain.   Yes [provider]  ALPRAZolam Duanne Moron) 0.5 MG tablet Take 0.5 mg by mouth 2 (two) times daily as needed for anxiety.    Yes [provider]  atorvastatin (LIPITOR) 10 MG tablet Take 1 tablet (10 mg total) by mouth daily. 02/20/14  Yes Short, Noah Delaine, MD  cholecalciferol (VITAMIN D) 1000 UNITS tablet Take 2,000 Units by mouth every morning.   Yes [provider]  citalopram (CELEXA) 20 MG tablet Take 1 tablet by mouth daily. 06/05/18  Yes [provider]  clarithromycin (BIAXIN) 500 MG tablet Take 500 mg by mouth 2 (two) times daily. 12/08/21  Yes [provider]   clopidogrel (PLAVIX) 75 MG tablet Take 75 mg by mouth daily.   Yes [provider]  docusate sodium (COLACE) 100 MG capsule Take 100 mg by mouth 2 (two) times daily.   Yes [provider]  feeding supplement, GLUCERNA SHAKE, (GLUCERNA SHAKE) LIQD Take 237 mLs by mouth daily as needed.    Yes [provider]  furosemide (LASIX) 40 MG tablet Take 1.5 tablets (60 mg total) by mouth daily. 02/18/16  Yes Minus Breeding, MD  hydrALAZINE (APRESOLINE) 10 MG tablet TAKE 1 TABLET IN THE MORNING AND AT BEDTIME Patient taking differently: Take 10 mg by mouth 2 (two) times daily. 12/30/20  Yes Adrian Prows, MD  insulin aspart (NOVOLOG) 100 UNIT/ML FlexPen Inject 5 Units into the skin 3 (three) times daily with meals. When BG is over 100   Yes [provider]  insulin glargine (LANTUS) 100 UNIT/ML Solostar Pen Inject 8 Units into the skin at bedtime.    Yes [provider]  loratadine (CLARITIN) 10 MG tablet Take 10 mg by mouth daily.   Yes [provider]  macitentan (OPSUMIT) 10 MG tablet Take 1 tablet (10 mg total) by mouth daily. 06/03/21  Yes Adrian Prows, MD  metoprolol tartrate (LOPRESSOR) 25 MG tablet Take 12.5 mg by mouth 2 (two) times daily. 12.28m in the AM, 12.573min the PM 06/10/21  Yes [provider]  mirtazapine (REMERON) 30 MG tablet Take 30 mg by mouth at bedtime.   Yes [provider]  Multiple Vitamin (MULTIVITAMIN) tablet Take 1 tablet by mouth daily. Reported on 02/06/2016   Yes [provider]  olmesartan (BENICAR) 5 MG tablet TAKE 1 TABLET DAILY 06/06/21  Yes Adrian Prows, MD  pantoprazole (PROTONIX) 40 MG tablet TAKE 1 TABLET TWICE A DAY BEFORE MEALS Patient taking differently: 40 mg daily. 09/25/20  Yes Annitta Needs, NP  Polyethyl Glycol-Propyl Glycol (SYSTANE OP) Apply to eye as needed.   Yes [provider]  potassium chloride (KLOR-CON) 10 MEQ tablet TAKE 1 TABLET DAILY 04/15/21  Yes Adrian Prows, MD   psyllium (METAMUCIL SMOOTH TEXTURE) 28 % packet Take 1 packet by mouth as needed.    Yes [provider]  sodium chloride (OCEAN) 0.65 % SOLN nasal spray Place 1 spray into both nostrils as needed for congestion.   Yes [provider]  thyroid (ARMOUR) 15 MG tablet Take 15 mg by mouth daily.   Yes [provider]  umeclidinium bromide (INCRUSE ELLIPTA) 62.5 MCG/INH AEPB Inhale 1 puff into the lungs daily.   Yes [provider]  ursodiol (ACTIGALL) 500 MG tablet Take 1 tablet (500 mg total) by mouth 2 (two) times daily with a meal. 12/11/21  Yes Erenest Rasher, PA-C  doxycycline (VIBRAMYCIN) 100 MG capsule Take 1 capsule (100 mg total) by mouth 2 (two) times daily. One po bid x 7 days Patient not taking: Reported on 12/14/2021 10/24/21   Milton Ferguson, MD  FREESTYLE LITE test strip 1 each 3 (three) times daily. 03/08/20   [provider]  L-Methylfolate-B12-B6-B2 (CEREFOLIN PO) Take 1 tablet by mouth daily.    [provider]      Allergies    Aricept Reather Littler hcl], Namenda [memantine hcl], Adcirca [tadalafil], Donepezil, Spironolactone, Codeine, Indocin [indomethacin], Latex, and Penicillins    Review of Systems   Review of Systems  Constitutional:  Positive for fatigue. Negative for fever.  Respiratory:  Positive for cough. Negative for chest tightness and shortness of breath.   Cardiovascular:  Negative for chest pain, palpitations and leg swelling.  Gastrointestinal:  Negative for abdominal pain, diarrhea and vomiting.  Skin:  Negative for rash.  Neurological:  Negative for headaches.   Physical Exam Updated Vital Signs BP (!) 117/48    Pulse (!) 53    Temp 98.1 F (36.7 C) (Oral)    Resp 18    Ht _0  (1.626 m)    Wt 63.5 kg    SpO2 100%    BMI 24.03 kg/m  Physical Exam Vitals and nursing Chambers reviewed.  Constitutional:      General: She is not in acute distress.    Appearance: Normal appearance. She is not diaphoretic.   HENT:     Head: Normocephalic.     Mouth/Throat:     Mouth: Mucous membranes are moist.  Cardiovascular:     Rate and Rhythm: Normal rate.  Pulmonary:     Effort: Pulmonary effort is normal. No tachypnea or respiratory distress.     Breath sounds: No wheezing.  Abdominal:     Palpations: Abdomen is soft.     Tenderness: There is no abdominal tenderness.  Musculoskeletal:     Right lower leg: No edema.     Left lower leg: No edema.  Skin:    General: Skin is warm.  Neurological:     Mental Status: She is alert and oriented to person, place, and time. Mental status is at baseline.  Psychiatric:        Mood  and Affect: Mood normal.    ED Results / Procedures / Treatments   Labs (all labs ordered are listed, but only abnormal results are displayed) Labs Reviewed  CBC WITH DIFFERENTIAL/PLATELET - Abnormal; Notable for the following components:      Result Value   WBC 3.0 (*)    RBC 3.74 (*)    MCV 105.1 (*)    All other components within normal limits  BASIC METABOLIC PANEL - Abnormal; Notable for the following components:   Glucose, Bld 235 (*)    BUN 31 (*)    Creatinine, Ser 1.12 (*)    GFR, Estimated 47 (*)    All other components within normal limits    EKG None  Radiology DG Chest Port 1 View  Result Date: 12/14/2021 CLINICAL DATA:  Cough and shortness of breath. EXAM: PORTABLE CHEST 1 VIEW COMPARISON:  12/03/2021 and older exams.  CT, 12/08/2021. FINDINGS: Bilateral irregular interstitial thickening, most prominent in the lower lungs, similar to the most recent prior chest radiograph. Hazy airspace opacity noted on the prior chest radiograph at the right lung base has improved. Lungs are hyperexpanded. No new lung abnormalities. No convincing pleural effusion and no pneumothorax. Cardiac silhouette is top-normal in size. No mediastinal or hilar masses. IMPRESSION: 1. Bilateral irregular interstitial thickening, greatest in the lower lungs, right greater than left,  similar to the prior chest radiograph and chest CT. Previously noted airspace opacity at the right lung base has improved. No other change. Findings consistent with multifocal pneumonia, but other causes of interstitial thickening including edema should be considered in the differential diagnosis. Electronically Signed   By: Lajean Manes M.D.   On: 12/14/2021 13:59    Procedures Procedures    Medications Ordered in ED Medications - No data to display  ED Course/ Medical Decision Making/ A&P                           Medical Decision Making Amount and/or Complexity of Data Reviewed Labs: ordered. Radiology: ordered.   88 year old female presents emergency department accompanied by daughter for concern of an isolated reading of hypoxia on her home pulse oximeter.  Patient has been fatigued, currently taking antibiotics for pneumonia but denies any other acute complaints including chest pain, hemoptysis, back pain, leg swelling.  Lung sounds are clear, no clinical findings of CHF, no active chest pain, low suspicion for ACS.  Blood work is baseline and reassuring.  Chest x-ray shows findings of pneumonia which are improved from previous.  Low suspicion for PE given the findings of pneumonia.  She has had no hypoxia or tachypnea here.  Patient appears stable, and well, no respiratory distress.  Episode of hypoxia feel was most likely secondary to pneumonia infection of which she is responding well to outpatient antibiotics.  No indication for emergent admission/further testing.  Daughter agrees with results and plan.  Patient at this time appears safe and stable for discharge and close outpatient follow up. Discharge plan and strict return to ED precautions discussed, patient verbalizes understanding and agreement.        Final Clinical Impression(s) / ED Diagnoses Final diagnoses:  Chronic pneumonia    Rx / DC Orders ED Discharge Orders     None         Lorelle Gibbs,  DO 12/14/21 1529

## 2021-12-14 NOTE — Discharge Instructions (Signed)
You have been seen and discharged from the emergency department.  Your x-ray shows improving findings in regards to pneumonia.  Continue on your antibiotics.  Follow-up with your primary provider for further evaluation and further care. Take home medications as prescribed. If you have any worsening symptoms or further concerns for your health please return to an emergency department for further evaluation.

## 2021-12-17 ENCOUNTER — Ambulatory Visit (INDEPENDENT_AMBULATORY_CARE_PROVIDER_SITE_OTHER): Payer: Medicare Other | Admitting: Physician Assistant

## 2021-12-17 ENCOUNTER — Encounter: Payer: Self-pay | Admitting: Physician Assistant

## 2021-12-17 ENCOUNTER — Other Ambulatory Visit: Payer: Self-pay

## 2021-12-17 VITALS — BP 184/72 | HR 87 | Wt 140.0 lb

## 2021-12-17 DIAGNOSIS — N29 Other disorders of kidney and ureter in diseases classified elsewhere: Secondary | ICD-10-CM | POA: Diagnosis not present

## 2021-12-17 DIAGNOSIS — N2889 Other specified disorders of kidney and ureter: Secondary | ICD-10-CM | POA: Insufficient documentation

## 2021-12-17 DIAGNOSIS — N289 Disorder of kidney and ureter, unspecified: Secondary | ICD-10-CM | POA: Diagnosis not present

## 2021-12-17 LAB — URINALYSIS, ROUTINE W REFLEX MICROSCOPIC
Bilirubin, UA: NEGATIVE
Glucose, UA: NEGATIVE
Ketones, UA: NEGATIVE
Leukocytes,UA: NEGATIVE
Nitrite, UA: NEGATIVE
Protein,UA: NEGATIVE
RBC, UA: NEGATIVE
Specific Gravity, UA: 1.015 (ref 1.005–1.030)
Urobilinogen, Ur: 0.2 mg/dL (ref 0.2–1.0)
pH, UA: 5.5 (ref 5.0–7.5)

## 2021-12-17 LAB — BLADDER SCAN AMB NON-IMAGING: Scan Result: 81

## 2021-12-17 NOTE — Progress Notes (Signed)
Urological Symptom Review  Patient is experiencing the following symptoms: Frequent urination Hard to postpone urination Leakage of urine Stream starts and stops   Review of Systems  Gastrointestinal (upper)  : Nausea Indigestion/heartburn  Gastrointestinal (lower) : Constipation  Constitutional : Fatigue  Skin: Itching  Eyes: Blurred vision  Ear/Nose/Throat : Sinus problems  Hematologic/Lymphatic: Easy bruising  Cardiovascular : Negative for cardiovascular symptoms  Respiratory : Shortness of breath  Endocrine: Negative for endocrine symptoms  Musculoskeletal: Negative for musculoskeletal symptoms  Neurological: Negative for neurological symptoms  Psychologic: Depression Anxiety

## 2021-12-17 NOTE — Progress Notes (Signed)
post void residual=81 ml

## 2021-12-17 NOTE — Progress Notes (Signed)
12/17/2021 2:20 PM   [name] 12-18-1930 [id number]  Referring provider: Annitta Needs, NP [address] [place],  [place] [address]  CC: Renal mass  HPI: [name] is an 88 year old female referred by the emergency department and her primary care provider for further evaluation of renal mass noted on recent imaging obtained in the emergency department.  She presents with her daughter.  During ED evaluation for respiratory complaints, contrasted CT of the chest was performed.  A 1.1 cm lesion on the upper pole of the left kidney was felt to be suspicious for renal cell carcinoma with comparison to MRI of the abdomen in December 2022.  Patient denies history of renal disease or urinary tract infections with the exception of multiple UTIs 4 years ago which resolved.  Today, she admits to urinary frequency, occasional urgency, occasional urge incontinence with intermittent stream.  She denies burning, pain, nocturia, gross hematuria.  No abdominal pain.  No flank pain.  She states she is comfortable with her urinary status and does not feel she needs any sort of treatment or intervention for her complaints.  Patient's history is significant for breast cancer for which she was treated in 2004.  She states she has not had a recurrence.  Medical records and imaging studies reviewed personally by myself as well as Dr. Alyson Ingles.  PVR=81 Urinalysis is clear  PMH: Past Medical History:  Diagnosis Date   Acute delirium 11/22/735   Acute diastolic congestive heart failure ([place]) 03/21/2014   Anxiety    Cancer ([place])    Carotid artery disease ([facility])    Bilateral ICA occlusion   Chronic headaches    Chronic liver disease    ?cirrhosis on 10/2014 CT. H/O elevated alkphos and +AMA on URSO.   Coronary atherosclerosis of native coronary artery    Mild nonobstructive disease at cardiac catheterization May 2011   Depression    Essential hypertension, benign    Fracture of femoral neck, right,  closed ([place]) 03/28/2014   History of breast cancer    History of GI bleed    Hyperlipidemia    Orthostatic hypotension    Diabetic polyneuropathy/diabetic dysautonomia    Osteopenia    Parotid tumor    Pneumonia 11/2016   Pneumonia    Pressure ulcer, heel, right, unstageable ([place]) 04/17/2014   PSVT (paroxysmal supraventricular tachycardia) ([place])    Syncope and collapse 04/15/2010   Qualifier: Diagnosis of  By: Angelena Form, MD, Christopher     Type 2 diabetes mellitus ([place])    UPPER GASTROINTESTINAL HEMORRHAGE 03/21/2007   Qualifier: Diagnosis of  By: Jonna Munro MD, Cornelius      Surgical History: Past Surgical History:  Procedure Laterality Date   ABDOMINAL HYSTERECTOMY     APPENDECTOMY     CATARACT EXTRACTION Bilateral    Dr. Gershon Crane   CHOLECYSTECTOMY  2008   COLONOSCOPY  2010   Dr. Oneida Alar: single tubular adenoma, one hyperplastic polyp. Next TCS 2020 health permitting.   ESOPHAGOGASTRODUODENOSCOPY N/A 08/17/2016   web in upper third of esophagus s/p dilation, five gastric ulcers without bleeding   ESOPHAGOGASTRODUODENOSCOPY N/A 01/01/2017    normal esophagus, gastritis   HEMORRHOID SURGERY  1960s   HIP ARTHROPLASTY Right 03/28/2014   Procedure: ARTHROPLASTY BIPOLAR HIP;  Surgeon: Carole Civil, MD;  Location: [facility];  Service: Orthopedics;  Laterality: Right;   LAPAROSCOPIC APPENDECTOMY N/A 04/13/2013   Procedure: APPENDECTOMY LAPAROSCOPIC;  Surgeon: Donato Heinz, MD;  Location: [facility];  Service: General;  Laterality: N/A;  MASTECTOMY  1994   Left breast -related to breast cancer   RIGHT HEART CATH N/A 04/29/2017   Procedure: Right Heart Cath;  Surgeon: Adrian Prows, MD;  Location: [facility];  Service: Cardiovascular;  Laterality: N/A;   RIGHT/LEFT HEART CATH AND CORONARY ANGIOGRAPHY N/A 04/13/2017   Procedure: RIGHT/LEFT HEART CATH AND CORONARY ANGIOGRAPHY;  Surgeon: Adrian Prows, MD;  Location: [facility];  Service: Cardiovascular;  Laterality: N/A;   SAVORY  DILATION N/A 08/17/2016   Procedure: SAVORY DILATION;  Surgeon: Danie Binder, MD;  Location: [facility];  Service: Endoscopy;  Laterality: N/A;   VESICOVAGINAL FISTULA CLOSURE W/ TAH  0940   YAG LASER APPLICATION Left    Dr. Zadie Rhine    Home Medications:  Allergies as of 12/17/2021       Reactions   Aricept [donepezil Hcl]    Hives, vomiting and headache   Namenda [memantine Hcl]    hives   Adcirca [tadalafil] Other (See Comments)   Caused blindness in one eye   Donepezil Nausea Only   Spironolactone Other (See Comments)   Dizziness, balance problems   Codeine Rash   Indocin [indomethacin] Rash   Latex Rash   Penicillins Rash        Medication List        Accurate as of December 17, 2021  2:20 PM. If you have any questions, ask your nurse or doctor.          acetaminophen 500 MG tablet Commonly known as: TYLENOL Take 500 mg by mouth every 8 (eight) hours as needed for mild pain or moderate pain.   ALPRAZolam 0.5 MG tablet Commonly known as: XANAX Take 0.5 mg by mouth 2 (two) times daily as needed for anxiety.   atorvastatin 10 MG tablet Commonly known as: LIPITOR Take 1 tablet (10 mg total) by mouth daily.   CEREFOLIN PO Take 1 tablet by mouth daily.   cholecalciferol 1000 units tablet Commonly known as: VITAMIN D Take 2,000 Units by mouth every morning.   citalopram 20 MG tablet Commonly known as: CELEXA Take 1 tablet by mouth daily.   clarithromycin 500 MG tablet Commonly known as: BIAXIN Take 500 mg by mouth 2 (two) times daily.   clopidogrel 75 MG tablet Commonly known as: PLAVIX Take 75 mg by mouth daily.   docusate sodium 100 MG capsule Commonly known as: COLACE Take 100 mg by mouth 2 (two) times daily.   doxycycline 100 MG capsule Commonly known as: VIBRAMYCIN Take 1 capsule (100 mg total) by mouth 2 (two) times daily. One po bid x 7 days   feeding supplement (GLUCERNA SHAKE) Liqd Take 237 mLs by mouth daily as needed.   FREESTYLE  LITE test strip Generic drug: glucose blood 1 each 3 (three) times daily.   furosemide 40 MG tablet Commonly known as: LASIX Take 1.5 tablets (60 mg total) by mouth daily.   hydrALAZINE 10 MG tablet Commonly known as: APRESOLINE TAKE 1 TABLET IN THE MORNING AND AT BEDTIME What changed: See the new instructions.   insulin aspart 100 UNIT/ML FlexPen Commonly known as: NOVOLOG Inject 5 Units into the skin 3 (three) times daily with meals. When BG is over 100   insulin glargine 100 UNIT/ML Solostar Pen Commonly known as: LANTUS Inject 8 Units into the skin at bedtime.   loratadine 10 MG tablet Commonly known as: CLARITIN Take 10 mg by mouth daily.   macitentan 10 MG tablet Commonly known as: OPSUMIT Take  1 tablet (10 mg total) by mouth daily.   metoprolol tartrate 25 MG tablet Commonly known as: LOPRESSOR Take 12.5 mg by mouth 2 (two) times daily. 12.90m in the AM, 12.579min the PM   mirtazapine 30 MG tablet Commonly known as: REMERON Take 30 mg by mouth at bedtime.   multivitamin tablet Take 1 tablet by mouth daily. Reported on 02/06/2016   olmesartan 5 MG tablet Commonly known as: BENICAR TAKE 1 TABLET DAILY   pantoprazole 40 MG tablet Commonly known as: PROTONIX TAKE 1 TABLET TWICE A DAY BEFORE MEALS What changed:  how much to take when to take this additional instructions   potassium chloride 10 MEQ tablet Commonly known as: KLOR-CON TAKE 1 TABLET DAILY   psyllium 28 % packet Commonly known as: METAMUCIL SMOOTH TEXTURE Take 1 packet by mouth as needed.   sodium chloride 0.65 % Soln nasal spray Commonly known as: OCEAN Place 1 spray into both nostrils as needed for congestion.   SYSTANE OP Apply to eye as needed.   thyroid 15 MG tablet Commonly known as: ARMOUR Take 15 mg by mouth daily.   umeclidinium bromide 62.5 MCG/INH Aepb Commonly known as: INCRUSE ELLIPTA Inhale 1 puff into the lungs daily.   ursodiol 500 MG tablet Commonly known as:  ACTIGALL Take 1 tablet (500 mg total) by mouth 2 (two) times daily with a meal.        Allergies:  Allergies  Allergen Reactions   Aricept [Donepezil Hcl]     Hives, vomiting and headache   Namenda [Memantine Hcl]     hives   Adcirca [Tadalafil] Other (See Comments)    Caused blindness in one eye   Donepezil Nausea Only   Spironolactone Other (See Comments)    Dizziness, balance problems   Codeine Rash   Indocin [Indomethacin] Rash   Latex Rash   Penicillins Rash    Family History: Family History  Problem Relation Age of Onset   Heart failure Mother    Kidney cancer Mother    Cancer Mother    Hypertension Mother    Lung cancer Father        Brain METS   Cancer Father    Cirrhosis Brother    Kidney cancer Sister    Cancer Sister    Hypertension Sister    Diabetes Son    Heart attack Neg Hx    Stroke Neg Hx     Social History:  reports that she quit smoking about 8 years ago. Her smoking use included cigarettes. She has a 20.00 pack-year smoking history. She has never used smokeless tobacco. She reports that she does not drink alcohol and does not use drugs.  ROS: All other review of systems were reviewed and are negative except what is noted above in HPI  Physical Exam: BP (!) 184/72    Pulse 87    Wt 140 lb (63.5 kg)    BMI 24.03 kg/m   Constitutional: Pt is thin and wearing O2 via [place]. Alert and oriented, No acute distress. HEENT: [place] AT, moist mucus membranes.  Trachea midline, no masses. Cardiovascular: No clubbing, cyanosis, or edema. Respiratory: Normal respiratory effort, patient can speak in full sentences. GI: Abdomen is soft, nontender, nondistended, no abdominal masses GU: No CVA tenderness.  Skin: No rashes, bruises or suspicious lesions. Neurologic: Grossly intact, no focal deficits, moving all 4 extremities. Psychiatric: Normal mood and affect.  Laboratory Data: Lab Results  Component Value Date   WBC 3.0 (L)  12/14/2021   HGB 12.5  12/14/2021   HCT 39.3 12/14/2021   MCV 105.1 (H) 12/14/2021   PLT 173 12/14/2021    Lab Results  Component Value Date   CREATININE 1.12 (H) 12/14/2021    Lab Results  Component Value Date   HGBA1C 7.9 (H) 01/02/2016    Urinalysis    Component Value Date/Time   COLORURINE YELLOW 07/09/2020 1650   APPEARANCEUR CLOUDY (A) 07/09/2020 1650   LABSPEC 1.015 07/09/2020 1650   PHURINE 5.0 07/09/2020 1650   GLUCOSEU NEGATIVE 07/09/2020 1650   HGBUR LARGE (A) 07/09/2020 1650   HGBUR small 08/13/2008 0925   BILIRUBINUR NEGATIVE 07/09/2020 1650   KETONESUR NEGATIVE 07/09/2020 1650   PROTEINUR 100 (A) 07/09/2020 1650   UROBILINOGEN 0.2 06/11/2015 1301   NITRITE NEGATIVE 07/09/2020 1650   LEUKOCYTESUR LARGE (A) 07/09/2020 1650    Lab Results  Component Value Date   BACTERIA FEW (A) 07/09/2020    Pertinent Imaging: DG Chest 2 View  Result Date: 12/04/2021 CLINICAL DATA:  Pneumonia due to infectious organism EXAM: CHEST - 2 VIEW COMPARISON:  10/24/2021 FINDINGS: Enlargement of cardiac silhouette with pulmonary vascular congestion. Atherosclerotic calcification aorta. Emphysematous and bronchitic changes consistent with COPD. Chronic Kerley B-lines at lung bases laterally. Superimposed consolidation involving RIGHT middle and RIGHT lower lobes. No pleural effusion or pneumothorax. Bones demineralized. IMPRESSION: Mild chronic CHF. COPD changes with superimposed consolidation RIGHT lung base involving RIGHT lower and to lesser degree RIGHT middle lobes, may represent asymmetric edema or pneumonia. Appearance is little changed since the prior exam. Radiographic follow-up until resolution of the RIGHT basilar process is identified to exclude underlying abnormalities and central airway obstruction. Electronically Signed   By: Lavonia Dana M.D.   On: 12/04/2021 08:39   DG Chest 2 View  Result Date: 10/24/2021 CLINICAL DATA:  Shortness of breath. EXAM: CHEST - 2 VIEW COMPARISON:  October 22, 2021. FINDINGS: Mild cardiomegaly is noted with central pulmonary vascular congestion. Bilateral pulmonary edema is noted. Right basilar atelectasis or edema is noted. No pneumothorax pleural effusion is noted. Bony thorax is unremarkable. IMPRESSION: Mild cardiomegaly with central pulmonary vascular congestion and probable bilateral pulmonary edema. Right basilar pneumonia or atelectasis is noted. Electronically Signed   By: Marijo Conception M.D.   On: 10/24/2021 16:09   DG Chest 2 View  Result Date: 10/22/2021 CLINICAL DATA:  Possible pneumonia. EXAM: CHEST - 2 VIEW COMPARISON:  Chest x-ray 07/18/2018. FINDINGS: There is right lower lobe airspace disease. The heart is mildly enlarged. There is central pulmonary vascular congestion. There is no pleural effusion or pneumothorax. No acute fractures are seen. IMPRESSION: 1. Right lower lobe airspace disease compatible with pneumonia. Followup PA and lateral chest X-ray is recommended in 3-4 weeks following trial of antibiotic therapy to ensure resolution and exclude underlying malignancy. 2. Cardiomegaly with central pulmonary vascular congestion. Electronically Signed   By: Ronney Asters M.D.   On: 10/22/2021 21:50   CT CHEST W CONTRAST  Result Date: 12/08/2021 CLINICAL DATA:  Follow-up pneumonia EXAM: CT CHEST WITH CONTRAST TECHNIQUE: Multidetector CT imaging of the chest was performed during intravenous contrast administration. RADIATION DOSE REDUCTION: This exam was performed according to the departmental dose-optimization program which includes automated exposure control, adjustment of the mA and/or kV according to patient size and/or use of iterative reconstruction technique. CONTRAST:  84m OMNIPAQUE IOHEXOL 300 MG/ML  SOLN COMPARISON:  X-ray 12/03/2021, CT 06/23/2013, MRI abdomen 10/20/2021 FINDINGS: Cardiovascular: Mild cardiomegaly. No pericardial effusion. Thoracic aorta is nonaneurysmal.  Atherosclerotic calcifications of the aorta and coronary  arteries. Main pulmonary trunk measures 3.1 cm in diameter. No central pulmonary arterial filling defects. Mediastinum/Nodes: No enlarged mediastinal, hilar, or axillary lymph nodes. Thyroid gland, trachea, and esophagus demonstrate no significant findings. Lungs/Pleura: Patchy airspace opacity predominantly within the peripheral aspect of the right lower lobe and right middle lobe. Interlobular septal thickening and hazy ground-glass attenuation within these regions. There is also patchy airspace opacity within the lingula. Small amount of dependent atelectasis in the left lung base. 5 mm noncalcified pulmonary nodule in the periphery of the right upper lobe (series 4, image 98), new from 2014. Mild emphysematous changes and biapical pleuroparenchymal scarring. No pleural effusion or pneumothorax. Upper Abdomen: 1.1 cm solid-appearing lesion emanating from the anteromedial aspect of the upper pole of the left kidney (series 2, image 172). Musculoskeletal: No chest wall abnormality. No acute or significant osseous findings. IMPRESSION: 1. Patchy airspace opacity predominantly within the peripheral aspect of the right lower lobe and right middle lobe with associated interlobular septal thickening and hazy ground-glass attenuation. Findings are favored to represent multifocal pneumonia over asymmetric edema. Radiographic follow-up to resolution is recommended. 2. Solid-appearing 1.1 cm lesion emanating from the anteromedial aspect of the upper pole of the left kidney, suspicious for small renal cell carcinoma. Retrospectively, this lesion appears slightly enhancing on recent MRI of the abdomen from 10/20/2021. Urology consultation is recommended. 3. Solitary 5 mm noncalcified pulmonary nodule in the periphery of the right upper lobe. Non-contrast chest CT can be considered in 12 months if patient is high-risk. This recommendation follows the consensus statement: Guidelines for Management of Incidental Pulmonary  Nodules Detected on CT Images: From the Fleischner Society 2017; Radiology 2017; 284:228-243. 4. Mild cardiomegaly with coronary artery and aortic atherosclerosis (ICD10-I70.0). 5. Mildly dilated main pulmonary trunk, which can be seen in the setting of pulmonary arterial hypertension. Electronically Signed   By: Davina Poke D.O.   On: 12/08/2021 14:48   MR LIVER W WO CONTRAST  Addendum Date: 12/08/2021   ADDENDUM REPORT: 12/08/2021 15:08 ADDENDUM: On CT scan 12/08/2021, potentially small enhancing LEFT renal lesion identified. In retrospect review of MRI 10/20/2021, there is a corresponding lesion in the upper pole of the LEFT kidney. There is significant motion degradation of the imaging; however, this lesion has mild post-contrast enhancement (image 37/14) compared to the noncontrast T1 weighted imaging. The lesion does have mild inherent high signal on noncontrast T1 weighted imaging however the enhancement appears significant. Additionally lesion is hypointense on T2 weighted imaging (image 18/series 4) which can be seen in papillary renal cell carcinoma. Recommend urology consultation and at minimal follow-up MRI without and with contrast in the future for surveillance. Electronically Signed   By: Suzy Bouchard M.D.   On: 12/08/2021 15:08   Result Date: 12/08/2021 CLINICAL DATA:  Elevated AFP.  History of breast cancer. EXAM: MRI ABDOMEN WITHOUT AND WITH CONTRAST TECHNIQUE: Multiplanar multisequence MR imaging of the abdomen was performed both before and after the administration of intravenous contrast. CONTRAST:  30m GADAVIST GADOBUTROL 1 MMOL/ML IV SOLN COMPARISON:  Ultrasound 04/07/2021. FINDINGS: Lower chest: Diffuse airspace disease in the RIGHT lower lobe suggest pneumonia. Hepatobiliary: No intrahepatic biliary duct dilatation. Liver has a smooth contour. Postcholecystectomy. The common bile duct is upper limits of normal at 5 mm. No filling defect within the common bile duct.  Post-contrast enhancement demonstrates no enhancing hepatic lesion. Pancreas: Mild fatty replacement through the pancreas. cystic lesion in the uncinate region of the pancreas measuring  10 mm (image 20/4). This is immediately adjacent to the ampulla. Two cystic lesions in the tail the pancreas the largest measuring 10 mm on image 15/4. There is no dilatation of the pancreatic duct. No post-contrast enhancement. Spleen: Normal spleen. Adrenals/urinary tract: Adrenal glands normal. Benign-appearing cysts of the kidneys. Stomach/Bowel: Stomach and limited of the small bowel is unremarkable Vascular/Lymphatic: Abdominal aortic normal caliber. No retroperitoneal periportal lymphadenopathy. Musculoskeletal: No aggressive osseous lesion IMPRESSION: 1. High concern for RIGHT lower lobe pneumonia. 2. No evidence of cirrhosis of the liver. No enhancing hepatic lesion. 3. No biliary duct dilatation.  Postcholecystectomy. 4. Several benign appearing cystic lesion in the pancreas. Favor post inflammatory cysts versus small benign cystic neoplasm. In patient this age, recommend follow-up imaging in 2 years. This recommendation follows ACR consensus guidelines: Management of Incidental Pancreatic Cysts: A White Paper of the ACR Incidental Findings Committee. [place] 8548;83:[id number]. Electronically Signed: By: Suzy Bouchard M.D. On: 10/21/2021 15:37   DG Chest Port 1 View  Result Date: 12/14/2021 CLINICAL DATA:  Cough and shortness of breath. EXAM: PORTABLE CHEST 1 VIEW COMPARISON:  12/03/2021 and older exams.  CT, 12/08/2021. FINDINGS: Bilateral irregular interstitial thickening, most prominent in the lower lungs, similar to the most recent prior chest radiograph. Hazy airspace opacity noted on the prior chest radiograph at the right lung base has improved. Lungs are hyperexpanded. No new lung abnormalities. No convincing pleural effusion and no pneumothorax. Cardiac silhouette is top-normal in size. No mediastinal  or hilar masses. IMPRESSION: 1. Bilateral irregular interstitial thickening, greatest in the lower lungs, right greater than left, similar to the prior chest radiograph and chest CT. Previously noted airspace opacity at the right lung base has improved. No other change. Findings consistent with multifocal pneumonia, but other causes of interstitial thickening including edema should be considered in the differential diagnosis. Electronically Signed   By: Lajean Manes M.D.   On: 12/14/2021 13:59     Assessment & Plan:   1. Renal lesion - BLADDER SCAN AMB NON-IMAGING - Ultrasound renal complete; Future - Urinalysis, Routine w reflex microscopic Images of MRI and CT scan reviewed with Dr. Alyson Ingles.  As the area in question is only 1 cm, it is difficult to assess whether it is truly solid or not.  Discussed findings at great length with the patient and her daughter who agree with the plan going forward to obtain complete renal ultrasound in 6 months to assess change in the mass.  2. Kidney disorder - Urinalysis, Routine w reflex microscopic Patient's creatinine level elevated during emergency department visit.  She will have this followed up with her primary care provider.  Increasing fluid intake discussed.  She will use caution secondary to her history of CHF.  3. Other disorders of kidney and ureter in diseases classified elsewhere - Ultrasound renal complete; Future   No follow-ups on file.  [place], Berneice Heinrich, PA-C  [facility] [place]

## 2021-12-21 DIAGNOSIS — I10 Essential (primary) hypertension: Secondary | ICD-10-CM | POA: Diagnosis not present

## 2021-12-22 DIAGNOSIS — B351 Tinea unguium: Secondary | ICD-10-CM | POA: Diagnosis not present

## 2021-12-22 DIAGNOSIS — E1151 Type 2 diabetes mellitus with diabetic peripheral angiopathy without gangrene: Secondary | ICD-10-CM | POA: Diagnosis not present

## 2021-12-22 DIAGNOSIS — M79675 Pain in left toe(s): Secondary | ICD-10-CM | POA: Diagnosis not present

## 2021-12-22 DIAGNOSIS — M79674 Pain in right toe(s): Secondary | ICD-10-CM | POA: Diagnosis not present

## 2021-12-25 ENCOUNTER — Other Ambulatory Visit: Payer: Self-pay | Admitting: Cardiology

## 2021-12-25 DIAGNOSIS — I10 Essential (primary) hypertension: Secondary | ICD-10-CM

## 2022-01-05 DIAGNOSIS — J449 Chronic obstructive pulmonary disease, unspecified: Secondary | ICD-10-CM | POA: Diagnosis not present

## 2022-01-05 DIAGNOSIS — E1159 Type 2 diabetes mellitus with other circulatory complications: Secondary | ICD-10-CM | POA: Diagnosis not present

## 2022-01-05 DIAGNOSIS — I5030 Unspecified diastolic (congestive) heart failure: Secondary | ICD-10-CM | POA: Diagnosis not present

## 2022-01-05 DIAGNOSIS — J189 Pneumonia, unspecified organism: Secondary | ICD-10-CM | POA: Diagnosis not present

## 2022-01-05 DIAGNOSIS — I272 Pulmonary hypertension, unspecified: Secondary | ICD-10-CM | POA: Diagnosis not present

## 2022-01-05 DIAGNOSIS — Z9981 Dependence on supplemental oxygen: Secondary | ICD-10-CM | POA: Diagnosis not present

## 2022-01-05 DIAGNOSIS — R531 Weakness: Secondary | ICD-10-CM | POA: Diagnosis not present

## 2022-01-12 DIAGNOSIS — Z20822 Contact with and (suspected) exposure to covid-19: Secondary | ICD-10-CM | POA: Diagnosis not present

## 2022-01-20 DIAGNOSIS — I10 Essential (primary) hypertension: Secondary | ICD-10-CM | POA: Diagnosis not present

## 2022-01-25 DIAGNOSIS — Z20828 Contact with and (suspected) exposure to other viral communicable diseases: Secondary | ICD-10-CM | POA: Diagnosis not present

## 2022-01-27 ENCOUNTER — Other Ambulatory Visit: Payer: Self-pay | Admitting: Pharmacist

## 2022-01-27 DIAGNOSIS — I471 Supraventricular tachycardia: Secondary | ICD-10-CM

## 2022-01-27 DIAGNOSIS — I270 Primary pulmonary hypertension: Secondary | ICD-10-CM

## 2022-01-27 MED ORDER — MACITENTAN 10 MG PO TABS: 10.0000 mg | ORAL_TABLET | Freq: Every day | ORAL | 3 refills | Status: DC

## 2022-01-28 ENCOUNTER — Other Ambulatory Visit: Payer: Self-pay

## 2022-01-28 ENCOUNTER — Encounter: Payer: Self-pay | Admitting: Pulmonary Disease

## 2022-01-28 ENCOUNTER — Ambulatory Visit (INDEPENDENT_AMBULATORY_CARE_PROVIDER_SITE_OTHER): Payer: Medicare Other | Admitting: Pulmonary Disease

## 2022-01-28 VITALS — BP 136/74 | HR 74 | Temp 98.5°F | Ht 64.0 in | Wt 146.8 lb

## 2022-01-28 DIAGNOSIS — J432 Centrilobular emphysema: Secondary | ICD-10-CM

## 2022-01-28 DIAGNOSIS — J9611 Chronic respiratory failure with hypoxia: Secondary | ICD-10-CM | POA: Diagnosis not present

## 2022-01-28 DIAGNOSIS — J849 Interstitial pulmonary disease, unspecified: Secondary | ICD-10-CM | POA: Diagnosis not present

## 2022-01-28 DIAGNOSIS — I2721 Secondary pulmonary arterial hypertension: Secondary | ICD-10-CM | POA: Diagnosis not present

## 2022-01-28 NOTE — Patient Instructions (Signed)
We will check a high resolution CT chest scan to evaluate your lungs after your recent pneumonias.  ? ?We will check pulmonary function tests at follow up in 2 months.  ?

## 2022-01-28 NOTE — Progress Notes (Signed)
? ?Synopsis: Referred in March 2023 for recurrent pneumonia by [name], DO ? ?Subjective:  ? ?PATIENT ID: [name] GENDER: female DOB: 08-01-1931, MRN: [id number] ? ? ?HPI ? ?Chief Complaint  ?Patient presents with  ? Pulmonary Consult  ?  Referred by Dr. Theda Sers for eval of pulmonary hypertension. She was previously seen by pulmonologist at [facility]- Dr Suzi Roots and has also been seen by Dr Chase Caller in the past- last in 2013. She states today her breathing is at baseline. She c/o cough with yellow sputum in the am's.    ? ?[name] is an 88 year old woman, former smoker with primary biliary cirrhosis, DMII, CAD, CHF, emphysema and pulmonary arterial hypertension who is referred to pulmonary clinic to establish care.  ? ?She has been followed at [facility] by Dr. Daphane Shepherd, last seen on 06/07/20. Her PAH was classified as Group 1 with concern for idiopathic vs portopulmonary vs secondary connective tissue disease. Basic inflammatory workup was negative from 06/07/20. She remains on monotherapy with macitentan as she could not tolerate PDE-5 inhibitor, selexipag or inhaled treprostinil. She is also taking 35m of lasix daily.  ? ?She is using incruse 1 puff daily for her emphysema. She denies issues with wheezing. She has a daily cough with yellowish sputum production.  ? ?She remains on 3L supplemental oxygen throughout the day and night. She has not had increase in her oxygen since being treated for pneumonia with multiple rounds of antibiotics since last summer. She was most recently treated with course of doxycycline in 11/2021. CT Chest 12/08/21 showed patchy airspace opacity predominantly within the peripheral aspect of the right lower lobe and right middle lobe. There was also interlobular septal thickening and hazy ground-glass attenuation within these regions. There is also patchy airspace opacity within the lingula. 554mpulmonary nodule of the right upper lobe.  ? ?She reports she is not sleeping well  and having frequent awakenings. She denies waking up short of breath or due to wheezing or cough. She has intermittent swelling of her legs and abdomen.  ? ?She quit smoking in 2014. She smoked half a pack to a pack per day for 40 years. 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 She is accompanied by her daughter at today's visit.  ? ?Pertinent Past Medical History: ?- Pulmonary arterial hypertension, presumed secondary to portal hypertension versus CTD ?- Cirrhosis by imaging, with no history of ascites, no varices on EGD, no splenomegaly on imaging, normal synthetic dysfunction ?- COPD, emphysema by imaging, with mild obstruction, severely decreased DLCO ?- Multiple pulmonary nodules, stable since 09/2017, documented 2 year stability ?- Diabetes/hypertension ?- Coronary artery disease, no prior interventions ?- Peripheral vascular disease, known carotid involvement ?- Breast cancer, 1984, s/p mastectomy and chemo, no XRT  ?- Esophageal web on endoscopy, dilated 08/2016 ?- Positive ANA, supposed to have additional studies but not done ?- Latex allergy ? ?Past Medical History:  ?Diagnosis Date  ? Acute delirium 06/23/2013  ? Acute diastolic congestive heart failure ([place]/04/2014  ? Anxiety   ? [facility]  ? Carotid artery disease ([facility] [place]  ? Bilateral ICA occlusion  ? Chronic headaches   ? Chronic liver disease   ? ?cirrhosis on 10/2014 CT. H/O elevated alkphos and +AMA on URSO.  ? Coronary atherosclerosis of native coronary artery   ? Mild nonobstructive disease at cardiac catheterization May 2011  ? Depression   ? Essential hypertension, benign   ?  Fracture of femoral neck, right, closed ([place]) 03/28/2014  ? History of breast cancer   ? History of GI bleed   ? Hyperlipidemia   ? Orthostatic hypotension   ? Diabetic polyneuropathy/diabetic dysautonomia   ? Osteopenia   ? Parotid tumor   ? Pneumonia 11/2016  ? Pneumonia   ? Pressure ulcer, heel, right, unstageable ([place]) 04/17/2014  ?  PSVT (paroxysmal supraventricular tachycardia) ([place])   ? Syncope and collapse 04/15/2010  ? Qualifier: Diagnosis of  By: Angelena Form, MD, Harrell Gave    ? Type 2 diabetes mellitus ([place])   ? UPPER GASTROINTESTINAL HEMORRHAGE 03/21/2007  ? Qualifier: Diagnosis of  By: Jonna Munro MD, Roderic Scarce    ?  ? ?Family History  ?Problem Relation Age of Onset  ? Heart failure Mother   ? Kidney cancer Mother   ? Cancer Mother   ? Hypertension Mother   ? Lung cancer Father   ?     Brain METS, smoked  ? Cancer Father   ? Kidney cancer Sister   ? Cancer Sister   ? Hypertension Sister   ? Cirrhosis Brother   ? Diabetes Son   ? Heart attack Neg Hx   ? Stroke Neg Hx   ?  ? ?Social History  ? ?Socioeconomic History  ? Marital status: Divorced  ?  Spouse name: Not on file  ? Number of children: 4  ? Years of education: Not on file  ? Highest education level: Not on file  ?Occupational History  ? Occupation: RETIRED FACTORY WORKER  ?  Comment: [employer]  ?Tobacco Use  ? Smoking status: Former  ?  Packs/day: 0.50  ?  Years: 40.00  ?  Pack years: 20.00  ?  Types: Cigarettes  ?  Quit date: 03/30/2013  ?  Years since quitting: 8.8  ? Smokeless tobacco: Never  ?Vaping Use  ? Vaping Use: Never used  ?Substance and Sexual Activity  ? Alcohol use: No  ?  Alcohol/week: 0.0 standard drinks  ? Drug use: No  ? Sexual activity: Not Currently  ?Other Topics Concern  ? Not on file  ?Social History Narrative  ? 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 Retired  ? Divorced  And widowed in 2010- did not  Get along with spouse  ? Tobacco abuse h/or-strong history,1/2 ppd for 50 years stopped 5 16 2011. Started age 51.  ? Drug use -no  ? Alcohol use - no  ? Lives with son  ? Education - 12 th grade  ?   ?   ?   ?   ?   ? ?Social Determinants of Health  ? ?Financial Resource Strain: Not on file  ?Food Insecurity: Not on file  ?Transportation Needs: Not on file  ?Physical Activity: Not on file  ?Stress: Not on file  ?Social Connections: Not on file  ?Intimate  Partner Violence: Not on file  ?  ? ?Allergies  ?Allergen Reactions  ? Aricept [Donepezil Hcl]   ?  Hives, vomiting and headache  ? Namenda [Memantine Hcl]   ?  hives  ? Adcirca [Tadalafil] Other (See Comments)  ?  Caused blindness in one eye  ? Donepezil Nausea Only  ? Spironolactone Other (See Comments)  ?  Dizziness, balance problems  ? Codeine Rash  ? Indocin [Indomethacin] Rash  ? Latex Rash  ? Penicillins Rash  ?  ? ?Outpatient Medications Prior to Visit  ?Medication Sig Dispense Refill  ? acetaminophen (TYLENOL) 500 MG tablet Take 500  mg by mouth every 8 (eight) hours as needed for mild pain or moderate pain.    ? ALPRAZolam (XANAX) 0.5 MG tablet Take 0.5 mg by mouth 2 (two) times daily as needed for anxiety.     ? atorvastatin (LIPITOR) 10 MG tablet Take 1 tablet (10 mg total) by mouth daily. 30 tablet 0  ? cholecalciferol (VITAMIN D) 1000 UNITS tablet Take 2,000 Units by mouth every morning.    ? citalopram (CELEXA) 20 MG tablet Take 1 tablet by mouth daily.    ? clopidogrel (PLAVIX) 75 MG tablet Take 75 mg by mouth daily.    ? docusate sodium (COLACE) 100 MG capsule Take 100 mg by mouth 2 (two) times daily.    ? feeding supplement, GLUCERNA SHAKE, (GLUCERNA SHAKE) LIQD Take 237 mLs by mouth daily as needed.     ? FREESTYLE LITE test strip 1 each 3 (three) times daily.    ? furosemide (LASIX) 40 MG tablet Take 1.5 tablets (60 mg total) by mouth daily. 135 tablet 3  ? hydrALAZINE (APRESOLINE) 10 MG tablet TAKE 1 TABLET IN THE MORNING AND AT BEDTIME 180 tablet 3  ? insulin aspart (NOVOLOG) 100 UNIT/ML FlexPen Inject 5 Units into the skin 3 (three) times daily with meals. When BG is over 100    ? insulin glargine (LANTUS) 100 UNIT/ML Solostar Pen Inject 8 Units into the skin at bedtime.     ? loratadine (CLARITIN) 10 MG tablet Take 10 mg by mouth daily.    ? macitentan (OPSUMIT) 10 MG tablet Take 1 tablet (10 mg total) by mouth daily. 90 tablet 3  ? metoprolol tartrate (LOPRESSOR) 25 MG tablet Take 12.5 mg by  mouth 2 (two) times daily. 12.55m in the AM, 12.536min the PM    ? mirtazapine (REMERON) 30 MG tablet Take 30 mg by mouth at bedtime.    ? Multiple Vitamin (MULTIVITAMIN) tablet Take 1 tablet by mouth

## 2022-02-03 DIAGNOSIS — Z20822 Contact with and (suspected) exposure to covid-19: Secondary | ICD-10-CM | POA: Diagnosis not present

## 2022-02-19 DIAGNOSIS — Z20822 Contact with and (suspected) exposure to covid-19: Secondary | ICD-10-CM | POA: Diagnosis not present

## 2022-02-20 DIAGNOSIS — I10 Essential (primary) hypertension: Secondary | ICD-10-CM | POA: Diagnosis not present

## 2022-02-27 ENCOUNTER — Ambulatory Visit (HOSPITAL_COMMUNITY)
Admission: RE | Admit: 2022-02-27 | Discharge: 2022-02-27 | Disposition: A | Discharge status: Home / Self Care | Payer: Medicare Other | Source: Ambulatory Visit | Attending: Pulmonary Disease | Admitting: Pulmonary Disease

## 2022-02-27 DIAGNOSIS — J432 Centrilobular emphysema: Secondary | ICD-10-CM | POA: Insufficient documentation

## 2022-02-27 DIAGNOSIS — J849 Interstitial pulmonary disease, unspecified: Secondary | ICD-10-CM | POA: Diagnosis not present

## 2022-02-27 DIAGNOSIS — I2721 Secondary pulmonary arterial hypertension: Secondary | ICD-10-CM

## 2022-02-27 DIAGNOSIS — I358 Other nonrheumatic aortic valve disorders: Secondary | ICD-10-CM | POA: Diagnosis not present

## 2022-02-27 DIAGNOSIS — I2510 Atherosclerotic heart disease of native coronary artery without angina pectoris: Secondary | ICD-10-CM | POA: Diagnosis not present

## 2022-03-02 DIAGNOSIS — Z20822 Contact with and (suspected) exposure to covid-19: Secondary | ICD-10-CM | POA: Diagnosis not present

## 2022-03-09 ENCOUNTER — Encounter: Payer: Self-pay | Admitting: Internal Medicine

## 2022-03-09 DIAGNOSIS — E119 Type 2 diabetes mellitus without complications: Secondary | ICD-10-CM | POA: Diagnosis not present

## 2022-03-09 DIAGNOSIS — E7800 Pure hypercholesterolemia, unspecified: Secondary | ICD-10-CM | POA: Diagnosis not present

## 2022-03-09 DIAGNOSIS — E539 Vitamin B deficiency, unspecified: Secondary | ICD-10-CM | POA: Diagnosis not present

## 2022-03-09 DIAGNOSIS — Z8639 Personal history of other endocrine, nutritional and metabolic disease: Secondary | ICD-10-CM | POA: Diagnosis not present

## 2022-03-09 DIAGNOSIS — I10 Essential (primary) hypertension: Secondary | ICD-10-CM | POA: Diagnosis not present

## 2022-03-16 DIAGNOSIS — I5030 Unspecified diastolic (congestive) heart failure: Secondary | ICD-10-CM | POA: Diagnosis not present

## 2022-03-16 DIAGNOSIS — E039 Hypothyroidism, unspecified: Secondary | ICD-10-CM | POA: Diagnosis not present

## 2022-03-16 DIAGNOSIS — E1159 Type 2 diabetes mellitus with other circulatory complications: Secondary | ICD-10-CM | POA: Diagnosis not present

## 2022-03-16 DIAGNOSIS — I10 Essential (primary) hypertension: Secondary | ICD-10-CM | POA: Diagnosis not present

## 2022-03-18 ENCOUNTER — Ambulatory Visit: Payer: Medicare Other | Admitting: Cardiology

## 2022-03-18 ENCOUNTER — Encounter: Payer: Self-pay | Admitting: Cardiology

## 2022-03-18 VITALS — BP 168/63 | HR 58 | Temp 98.3°F | Resp 17 | Ht 64.0 in | Wt 148.8 lb

## 2022-03-18 DIAGNOSIS — I5032 Chronic diastolic (congestive) heart failure: Secondary | ICD-10-CM | POA: Diagnosis not present

## 2022-03-18 DIAGNOSIS — I270 Primary pulmonary hypertension: Secondary | ICD-10-CM | POA: Diagnosis not present

## 2022-03-18 DIAGNOSIS — I10 Essential (primary) hypertension: Secondary | ICD-10-CM

## 2022-03-18 NOTE — Progress Notes (Signed)
? ?Primary Physician/Referring:  [name], DO ? ?Patient ID: [name], female    DOB: 09/06/1931, 88 y.o.   MRN: [id number] ? ?Chief Complaint  ?Patient presents with  ? Hypertension  ? Follow-up  ?  6 month  ? ?HPI: [name]  is a 88 y.o. female  AAF patient with primary pulmonary hypertension,  cryptogenic cirrhosis of the liver, presently on Opsumit since July 2018, did not tolerate Adcirca or Sildanafil due to visual side effects. She is also being followed at [facility] by Dr. Suzi Roots, last seen in July 2021 in November 2018 recommended continuing present medical therapy in view of advanced age and other underlying comorbidity difficult to delineate 6-minute walk test was appropriate. ? ?She has history of breast cancer status post radical mastectomy and chemotherapy, well controlled diabetes mellitus, moderate coronary artery disease and bilateral carotid artery occlusion without stroke and is on chronic plavix.  No recent hospital visits or ED visits. Denies leg edema, denies worsening dyspnea, no chest pain or palpitations.  This is a 67-monthoffice visit. ? ?Review of Systems  ?Eyes:  Negative for blurred vision.  ?Cardiovascular:  Positive for dyspnea on exertion (stable). Negative for chest pain and leg swelling.  ?Musculoskeletal:  Positive for arthritis and joint pain.  ?Gastrointestinal:  Negative for melena.  ?Objective  ?Blood pressure (!) 168/63, pulse (!) 58, temperature 98.3 ?F (36.8 ?C), temperature source Temporal, resp. rate 17, height _0  (1.626 m), weight 148 lb 12.8 oz (67.5 kg), SpO2 98 %. Body mass index is 25.54 kg/m?. ? ?  03/18/2022  ?  2:01 PM 03/18/2022  ?  2:00 PM 01/28/2022  ?  2:10 PM  ?Vitals with BMI  ?Height  _1  _2   ?Weight  148 lbs 13 oz 146 lbs 13 oz  ?BMI  25.53 25.19  ?Systolic [id number] ?Diastolic 63 61 74  ?Pulse 58 60 74  ? ?. ?Physical Exam ?Constitutional:   ?   General: She is not in acute distress. ?   Appearance: She is well-developed.   ?Neck:  ?   Vascular: No carotid bruit (right more prominant) or JVD.  ?Cardiovascular:  ?   Rate and Rhythm: Normal rate and regular rhythm.  ?   Pulses: Normal pulses and intact distal pulses.  ?   Heart sounds: Murmur heard.  ?Blowing midsystolic murmur is present with a grade of 2/6 at the lower left sternal border.  ?  No gallop.  ?Pulmonary:  ?   Effort: Pulmonary effort is normal.  ?   Breath sounds: Examination of the left-middle field reveals rales. Examination of the left-lower field reveals rhonchi and rales. Rhonchi and rales present.  ?Abdominal:  ?   General: Bowel sounds are normal.  ?   Palpations: Abdomen is soft.  ?Musculoskeletal:     ?   General: No swelling.  ?   Cervical back: Neck supple.  ?Skin: ?   Capillary Refill: Capillary refill takes less than 2 seconds.  ? ?Radiology: ?No results found. ? ?Laboratory examination:  ? ? ?External labs: ? ?Labs 03/09/2022: ? ?A1c 7.1%. ? ?Total cholesterol 185, triglycerides 104, HDL 71, LDL 93.  Non-HDL cholesterol 114. ? ?Hb 11.4/HCT 34.8, platelets 11/24/1970, mild microcytic indicis. ? ?BUN 30, creatinine 1.25, EGFR 44 mL, potassium 4.1.  LFTs normal. ? ?  ?Medications after present encounter ? ?Current Outpatient Medications:  ?  acetaminophen (TYLENOL) 500 MG tablet, Take 500 mg by mouth every  8 (eight) hours as needed for mild pain or moderate pain., Disp: , Rfl:  ?  ALPRAZolam (XANAX) 0.5 MG tablet, Take 0.5 mg by mouth 2 (two) times daily as needed for anxiety. , Disp: , Rfl:  ?  atorvastatin (LIPITOR) 10 MG tablet, Take 1 tablet (10 mg total) by mouth daily., Disp: 30 tablet, Rfl: 0 ?  cholecalciferol (VITAMIN D) 1000 UNITS tablet, Take 2,000 Units by mouth every morning., Disp: , Rfl:  ?  citalopram (CELEXA) 20 MG tablet, Take 1 tablet by mouth daily., Disp: , Rfl:  ?  clopidogrel (PLAVIX) 75 MG tablet, Take 75 mg by mouth daily., Disp: , Rfl:  ?  docusate sodium (COLACE) 100 MG capsule, Take 100 mg by mouth 2 (two) times daily., Disp: , Rfl:  ?   feeding supplement, GLUCERNA SHAKE, (GLUCERNA SHAKE) LIQD, Take 237 mLs by mouth daily as needed. , Disp: , Rfl:  ?  FREESTYLE LITE test strip, 1 each 3 (three) times daily., Disp: , Rfl:  ?  furosemide (LASIX) 40 MG tablet, Take 1.5 tablets (60 mg total) by mouth daily., Disp: 135 tablet, Rfl: 3 ?  hydrALAZINE (APRESOLINE) 10 MG tablet, TAKE 1 TABLET IN THE MORNING AND AT BEDTIME, Disp: 180 tablet, Rfl: 3 ?  insulin aspart (NOVOLOG) 100 UNIT/ML FlexPen, Inject 5 Units into the skin 3 (three) times daily with meals. When BG is over 100, Disp: , Rfl:  ?  insulin glargine (LANTUS) 100 UNIT/ML Solostar Pen, Inject 8 Units into the skin at bedtime. , Disp: , Rfl:  ?  loratadine (CLARITIN) 10 MG tablet, Take 10 mg by mouth daily., Disp: , Rfl:  ?  macitentan (OPSUMIT) 10 MG tablet, Take 1 tablet (10 mg total) by mouth daily., Disp: 90 tablet, Rfl: 3 ?  metoprolol tartrate (LOPRESSOR) 25 MG tablet, Take 12.5 mg by mouth 2 (two) times daily. 12.44m in the AM, 12.51min the PM, Disp: , Rfl:  ?  mirtazapine (REMERON) 30 MG tablet, Take 30 mg by mouth at bedtime., Disp: , Rfl:  ?  Multiple Vitamin (MULTIVITAMIN) tablet, Take 1 tablet by mouth daily. Reported on 02/06/2016, Disp: , Rfl:  ?  olmesartan (BENICAR) 5 MG tablet, TAKE 1 TABLET DAILY, Disp: 90 tablet, Rfl: 3 ?  pantoprazole (PROTONIX) 40 MG tablet, TAKE 1 TABLET TWICE A DAY BEFORE MEALS (Patient taking differently: 40 mg daily.), Disp: 180 tablet, Rfl: 3 ?  Polyethyl Glycol-Propyl Glycol (SYSTANE OP), Apply to eye as needed., Disp: , Rfl:  ?  potassium chloride (KLOR-CON) 10 MEQ tablet, TAKE 1 TABLET DAILY, Disp: 90 tablet, Rfl: 3 ?  psyllium (METAMUCIL SMOOTH TEXTURE) 28 % packet, Take 1 packet by mouth as needed. , Disp: , Rfl:  ?  sodium chloride (OCEAN) 0.65 % SOLN nasal spray, Place 1 spray into both nostrils as needed for congestion., Disp: , Rfl:  ?  thyroid (ARMOUR) 15 MG tablet, Take 15 mg by mouth daily., Disp: , Rfl:  ?  umeclidinium bromide (INCRUSE  ELLIPTA) 62.5 MCG/INH AEPB, Inhale 1 puff into the lungs daily., Disp: , Rfl:  ?  ursodiol (ACTIGALL) 500 MG tablet, Take 1 tablet (500 mg total) by mouth 2 (two) times daily with a meal., Disp: 180 tablet, Rfl: 3 ?  L-Methylfolate-B12-B6-B2 (CEREFOLIN PO), Take 1 tablet by mouth daily., Disp: , Rfl:   ?  ?Cardiac Studies:  ? ? ?Right Heart Cath 04/29/2017, RA pressure 8/2, mean 2 mmHg. RV 78/1, EDP 3 mmHg. PA 80/18, mean 42 mmHg. PW  13/14, mean 11 mmHg. PA saturation was 73%, aortic saturation 98%. Cardiac output 4.88, cardiac index 2.85 by Fick. ? ?Carotid artery duplex 08/27/2021:  ?Duplex suggests stenosis in the right internal carotid artery (total occlusion). ?Duplex suggests stenosis in the left internal carotid artery (50-69%), upper limit of spectrum. The left PSV internal/common carotid artery ratio of 5.35 is consistent with a stenosis of >70%. Duplex suggests stenosis in the left external carotid artery (<50%). ?Right vertebral artery flow is not visualized. Antegrade left vertebral artery flow. ?Comparison from 03/10/2017 not made due to poor quality study.  Previously reported bilateral ICA occlusion. Follow up in 6 months if clinically indicated. ? ?PCV ECHOCARDIOGRAM COMPLETE 08/27/2021  ?Left ventricle cavity is normal in size. Mild concentric hypertrophy of the left ventricle. Normal global wall motion. Normal LV systolic function with visual EF 50-55%. Doppler evidence of grade II (pseudonormal) diastolic dysfunction, elevated LAP. ?Left atrial cavity is moderately dilated. ?Mildly restricted mitral valve leaflets. Mild (Grade I) mitral regurgitation. ?Moderate tricuspid regurgitation. Estimated pulmonary artery systolic pressure 45 mmHg. ?Mild pulmonic regurgitation. ?Previous study on 01/03/2020 showed mod RA dilatation, estimated PASP 54 mmHg. ? ? ?EKG: ?EKG 03/18/2022: Sinus rhythm with marked first-degree block, PR interval 318 ms, normal axis, incomplete right bundle branch block.  No evidence of  ischemia, normal QT interval.  No significant change from 09/17/2021 and 01/22/2021.  ? ? ?  09/17/2021  ?  2:15 PM 01/22/2021  ?  3:24 PM 07/25/2020  ?  3:36 PM 05/24/2020  ?  2:56 PM 04/25/2019  ?  1:48 PM 04/25/2019  ?

## 2022-03-22 DIAGNOSIS — I10 Essential (primary) hypertension: Secondary | ICD-10-CM | POA: Diagnosis not present

## 2022-03-23 DIAGNOSIS — M79675 Pain in left toe(s): Secondary | ICD-10-CM | POA: Diagnosis not present

## 2022-03-23 DIAGNOSIS — E1151 Type 2 diabetes mellitus with diabetic peripheral angiopathy without gangrene: Secondary | ICD-10-CM | POA: Diagnosis not present

## 2022-03-23 DIAGNOSIS — Z20822 Contact with and (suspected) exposure to covid-19: Secondary | ICD-10-CM | POA: Diagnosis not present

## 2022-03-23 DIAGNOSIS — B351 Tinea unguium: Secondary | ICD-10-CM | POA: Diagnosis not present

## 2022-03-23 DIAGNOSIS — M79674 Pain in right toe(s): Secondary | ICD-10-CM | POA: Diagnosis not present

## 2022-03-26 ENCOUNTER — Telehealth: Payer: Self-pay | Admitting: Internal Medicine

## 2022-03-26 NOTE — Telephone Encounter (Signed)
Returned the call pt's daughter and advised her that the pt will not need labs done prior to her visit ?

## 2022-03-26 NOTE — Telephone Encounter (Signed)
Pt's daughter called to make follow up OV and is aware of appt.. She asked for the nurse to call if patient needed any labs done prior to her OV. [phone] ?

## 2022-04-10 ENCOUNTER — Other Ambulatory Visit: Payer: Self-pay | Admitting: Cardiology

## 2022-04-15 ENCOUNTER — Ambulatory Visit (INDEPENDENT_AMBULATORY_CARE_PROVIDER_SITE_OTHER): Payer: Medicare Other | Admitting: Pulmonary Disease

## 2022-04-15 ENCOUNTER — Encounter: Payer: Self-pay | Admitting: Pulmonary Disease

## 2022-04-15 VITALS — BP 142/64 | HR 64 | Ht 64.0 in | Wt 148.4 lb

## 2022-04-15 DIAGNOSIS — I2721 Secondary pulmonary arterial hypertension: Secondary | ICD-10-CM

## 2022-04-15 DIAGNOSIS — J432 Centrilobular emphysema: Secondary | ICD-10-CM

## 2022-04-15 DIAGNOSIS — J9611 Chronic respiratory failure with hypoxia: Secondary | ICD-10-CM

## 2022-04-15 DIAGNOSIS — J849 Interstitial pulmonary disease, unspecified: Secondary | ICD-10-CM | POA: Diagnosis not present

## 2022-04-15 LAB — PULMONARY FUNCTION TEST
DL/VA % pred: 51 %
DL/VA: 2.08 ml/min/mmHg/L
DLCO cor % pred: 39 %
DLCO cor: 7.3 ml/min/mmHg
DLCO unc % pred: 39 %
DLCO unc: 7.3 ml/min/mmHg
FEF 25-75 Post: 0.72 L/sec
FEF 25-75 Pre: 0.49 L/sec
FEF2575-%Change-Post: 47 %
FEF2575-%Pred-Post: 74 %
FEF2575-%Pred-Pre: 50 %
FEV1-%Change-Post: 10 %
FEV1-%Pred-Post: 96 %
FEV1-%Pred-Pre: 86 %
FEV1-Post: 1.25 L
FEV1-Pre: 1.13 L
FEV1FVC-%Change-Post: 3 %
FEV1FVC-%Pred-Pre: 83 %
FEV6-%Change-Post: 5 %
FEV6-%Pred-Post: 120 %
FEV6-%Pred-Pre: 114 %
FEV6-Post: 1.92 L
FEV6-Pre: 1.81 L
FEV6FVC-%Change-Post: -1 %
FEV6FVC-%Pred-Post: 102 %
FEV6FVC-%Pred-Pre: 104 %
FVC-%Change-Post: 6 %
FVC-%Pred-Post: 117 %
FVC-%Pred-Pre: 109 %
FVC-Post: 1.96 L
FVC-Pre: 1.83 L
Post FEV1/FVC ratio: 64 %
Post FEV6/FVC ratio: 98 %
Pre FEV1/FVC ratio: 62 %
Pre FEV6/FVC Ratio: 99 %
RV % pred: 107 %
RV: 2.76 L
TLC % pred: 90 %
TLC: 4.58 L

## 2022-04-15 NOTE — Patient Instructions (Signed)
Follow up as needed  We will place a referral to palliative care to help with further symptom management  Please call with any questions

## 2022-04-15 NOTE — Progress Notes (Signed)
PFT done today. 

## 2022-04-15 NOTE — Progress Notes (Signed)
Synopsis: Referred in March 2023 for recurrent pneumonia by Michele Morning, DO  Subjective:   PATIENT ID: [name] GENDER: female DOB: Oct 22, 1931, MRN: [id number]   HPI  Chief Complaint  Patient presents with   Follow-up    F/U after PFT. States she still has some SOB in the mornings. Using 3L of O2.    [name] is an 88 year old woman, former smoker with primary biliary cirrhosis, DMII, CAD, CHF, emphysema and pulmonary arterial hypertension who returns to pulmonary clinic for follow up.  PFTs today show mild obstructive airways disease and severe diffusion defect.   She was seen by Dr. Einar Gip on 03/18/22 in which long discussion with patient and her daughter regarding focusing her care on quality of life. She is DNR and no further aggressive or invasive work up was recommended. Family was in agreement with this plan.   HRCT Chest 02/27/2022 showed progressive interstitial lung disease with basilar predominance. There is also enlargement of a nodule of the RLL measuring 1.3 x 0.9cm. There is also enlargement of multiple other lung nodules.   Patient is otherwise feeling well today. No increase in her shortness of breath or oxygen requirements.  Initial OV 01/28/22 She has been followed at [facility] by Dr. Daphane Shepherd, last seen on 06/07/20. Her PAH was classified as Group 1 with concern for idiopathic vs portopulmonary vs secondary connective tissue disease. Basic inflammatory workup was negative from 06/07/20. She remains on monotherapy with macitentan as she could not tolerate PDE-5 inhibitor, selexipag or inhaled treprostinil. She is also taking 73m of lasix daily.   She is using incruse 1 puff daily for her emphysema. She denies issues with wheezing. She has a daily cough with yellowish sputum production.   She remains on 3L supplemental oxygen throughout the day and night. She has not had increase in her oxygen since being treated for pneumonia with multiple rounds of antibiotics  since last summer. She was most recently treated with course of doxycycline in 11/2021. CT Chest 12/08/21 showed patchy airspace opacity predominantly within the peripheral aspect of the right lower lobe and right middle lobe. There was also interlobular septal thickening and hazy ground-glass attenuation within these regions. There is also patchy airspace opacity within the lingula. 542mpulmonary nodule of the right upper lobe.   She reports she is not sleeping well and having frequent awakenings. She denies waking up short of breath or due to wheezing or cough. She has intermittent swelling of her legs and abdomen.   She quit smoking in 2014. She smoked half a pack to a pack per day for 40 years. 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 She is accompanied by her daughter at today's visit.   Pertinent Past Medical History: - Pulmonary arterial hypertension, presumed secondary to portal hypertension versus CTD - Cirrhosis by imaging, with no history of ascites, no varices on EGD, no splenomegaly on imaging, normal synthetic dysfunction - COPD, emphysema by imaging, with mild obstruction, severely decreased DLCO - Multiple pulmonary nodules, stable since 09/2017, documented 2 year stability - Diabetes/hypertension - Coronary artery disease, no prior interventions - Peripheral vascular disease, known carotid involvement - Breast cancer, 1984, s/p mastectomy and chemo, no XRT  - Esophageal web on endoscopy, dilated 08/2016 - Positive ANA, supposed to have additional studies but not done - Latex allergy  Past Medical History:  Diagnosis Date   Acute delirium 8/02/20/9628 Acute diastolic congestive heart failure (  [place]) 03/21/2014   Anxiety    Cancer ([place])    Carotid artery disease ([place])    Bilateral ICA occlusion   Chronic headaches    Chronic liver disease    ?cirrhosis on 10/2014 CT. H/O elevated alkphos and +AMA on URSO.   Coronary atherosclerosis of native  coronary artery    Mild nonobstructive disease at cardiac catheterization May 2011   Depression    Essential hypertension, benign    Fracture of femoral neck, right, closed ([place]) 03/28/2014   History of breast cancer    History of GI bleed    Hyperlipidemia    Orthostatic hypotension    Diabetic polyneuropathy/diabetic dysautonomia    Osteopenia    Parotid tumor    Pneumonia 11/2016   Pneumonia    Pressure ulcer, heel, right, unstageable ([place]) 04/17/2014   PSVT (paroxysmal supraventricular tachycardia) ([facility])    Syncope and collapse 04/15/2010   Qualifier: Diagnosis of  By: Angelena Form, MD, Christopher     Type 2 diabetes mellitus ([place])    UPPER GASTROINTESTINAL HEMORRHAGE 03/21/2007   Qualifier: Diagnosis of  By: Jonna Munro MD, Roderic Scarce       Family History  Problem Relation Age of Onset   Heart failure Mother    Kidney cancer Mother    Cancer Mother    Hypertension Mother    Lung cancer Father        Brain METS, smoked   Cancer Father    Kidney cancer Sister    Cancer Sister    Hypertension Sister    Cirrhosis Brother    Diabetes Son    Heart attack Neg Hx    Stroke Neg Hx      Social History   Socioeconomic History   Marital status: Divorced    Spouse name: Not on file   Number of children: 4   Years of education: Not on file   Highest education level: Not on file  Occupational History   Occupation: RETIRED FACTORY WORKER    Comment: [employer]  Tobacco Use   Smoking status: Former    Packs/day: 0.50    Years: 40.00    Pack years: 20.00    Types: Cigarettes    Quit date: 03/30/2013    Years since quitting: 9.0   Smokeless tobacco: Never  Vaping Use   Vaping Use: Never used  Substance and Sexual Activity   Alcohol use: No    Alcohol/week: 0.0 standard drinks   Drug use: No   Sexual activity: Not Currently  Other Topics Concern   Not on file  Social History Narrative   Occupation [employer] care [employer] children   Retired   Divorced  And widowed in  2010- did not  Get along with spouse   Tobacco abuse h/or-strong history,1/2 ppd for 50 years stopped 5 16 2011. Started age 55.   Drug use -no   Alcohol use - no   Lives with son   Education - 12 th grade                  Social Determinants of Health   Financial Resource Strain: Not on file  Food Insecurity: Not on file  Transportation Needs: Not on file  Physical Activity: Not on file  Stress: Not on file  Social Connections: Not on file  Intimate Partner Violence: Not on file     Allergies  Allergen Reactions   Aricept [Donepezil Hcl]     Hives, vomiting and headache   [facility]  Hcl]     hives   Adcirca [Tadalafil] Other (See Comments)    Caused blindness in one eye   Donepezil Nausea Only   Spironolactone Other (See Comments)    Dizziness, balance problems   Codeine Rash   Indocin [Indomethacin] Rash   Latex Rash   Penicillins Rash     Outpatient Medications Prior to Visit  Medication Sig Dispense Refill   acetaminophen (TYLENOL) 500 MG tablet Take 500 mg by mouth every 8 (eight) hours as needed for mild pain or moderate pain.     ALPRAZolam (XANAX) 0.5 MG tablet Take 0.5 mg by mouth 2 (two) times daily as needed for anxiety.      atorvastatin (LIPITOR) 10 MG tablet Take 1 tablet (10 mg total) by mouth daily. 30 tablet 0   cholecalciferol (VITAMIN D) 1000 UNITS tablet Take 2,000 Units by mouth every Chambers.     citalopram (CELEXA) 20 MG tablet Take 1 tablet by mouth daily.     clopidogrel (PLAVIX) 75 MG tablet Take 75 mg by mouth daily.     docusate sodium (COLACE) 100 MG capsule Take 100 mg by mouth 2 (two) times daily.     feeding supplement, GLUCERNA SHAKE, (GLUCERNA SHAKE) LIQD Take 237 mLs by mouth daily as needed.      FREESTYLE LITE test strip 1 each 3 (three) times daily.     furosemide (LASIX) 40 MG tablet Take 1.5 tablets (60 mg total) by mouth daily. 135 tablet 3   hydrALAZINE (APRESOLINE) 10 MG tablet TAKE 1 TABLET IN THE Chambers AND AT  BEDTIME 180 tablet 3   insulin aspart (NOVOLOG) 100 UNIT/ML FlexPen Inject 5 Units into the skin 3 (three) times daily with meals. When BG is over 100     insulin glargine (LANTUS) 100 UNIT/ML Solostar Pen Inject 8 Units into the skin at bedtime.      loratadine (CLARITIN) 10 MG tablet Take 10 mg by mouth daily.     macitentan (OPSUMIT) 10 MG tablet Take 1 tablet (10 mg total) by mouth daily. 90 tablet 3   metoprolol tartrate (LOPRESSOR) 25 MG tablet Take 12.5 mg by mouth 2 (two) times daily. 12.5m in the AM, 12.573min the PM     mirtazapine (REMERON) 30 MG tablet Take 30 mg by mouth at bedtime.     Multiple Vitamin (MULTIVITAMIN) tablet Take 1 tablet by mouth daily. Reported on 02/06/2016     olmesartan (BENICAR) 5 MG tablet TAKE 1 TABLET DAILY 90 tablet 3   pantoprazole (PROTONIX) 40 MG tablet TAKE 1 TABLET TWICE A DAY BEFORE MEALS (Patient taking differently: 40 mg daily.) 180 tablet 3   Polyethyl Glycol-Propyl Glycol (SYSTANE OP) Apply to eye as needed.     potassium chloride (KLOR-CON) 10 MEQ tablet TAKE 1 TABLET DAILY 90 tablet 3   psyllium (METAMUCIL SMOOTH TEXTURE) 28 % packet Take 1 packet by mouth as needed.      sodium chloride (OCEAN) 0.65 % SOLN nasal spray Place 1 spray into both nostrils as needed for congestion.     thyroid (ARMOUR) 15 MG tablet Take 15 mg by mouth daily.     umeclidinium bromide (INCRUSE ELLIPTA) 62.5 MCG/INH AEPB Inhale 1 puff into the lungs daily.     ursodiol (ACTIGALL) 500 MG tablet Take 1 tablet (500 mg total) by mouth 2 (two) times daily with a meal. 180 tablet 3   No facility-administered medications prior to visit.    Review of Systems  Constitutional:  Positive for malaise/fatigue. Negative for chills, fever and weight loss.  HENT:  Negative for congestion, sinus pain and sore throat.   Eyes: Negative.   Respiratory:  Positive for shortness of breath. Negative for cough, hemoptysis, sputum production and wheezing.   Cardiovascular:  Negative for  chest pain, palpitations, orthopnea, claudication and leg swelling.  Gastrointestinal:  Negative for abdominal pain, heartburn, nausea and vomiting.  Genitourinary: Negative.   Musculoskeletal:  Negative for joint pain and myalgias.  Skin:  Negative for rash.  Neurological:  Negative for weakness.  Endo/Heme/Allergies: Negative.   Psychiatric/Behavioral: Negative.       Objective:   Vitals:   04/15/22 1359  BP: (!) 142/64  Pulse: 64  SpO2: 94%  Weight: 148 lb 6.4 oz (67.3 kg)  Height: _0  (1.626 m)   Physical Exam Constitutional:      General: She is not in acute distress.    Appearance: She is not ill-appearing.  HENT:     Head: Normocephalic and atraumatic.  Eyes:     General: No scleral icterus.    Conjunctiva/sclera: Conjunctivae normal.  Cardiovascular:     Rate and Rhythm: Normal rate and regular rhythm.     Pulses: Normal pulses.     Heart sounds: Normal heart sounds. No murmur heard. Pulmonary:     Effort: Pulmonary effort is normal.     Breath sounds: Decreased breath sounds and rales (bibasilar, mild) present. No wheezing or rhonchi.  Musculoskeletal:     Right lower leg: No edema.     Left lower leg: No edema.  Lymphadenopathy:     Cervical: Cervical adenopathy present.  Skin:    General: Skin is warm and dry.  Neurological:     General: No focal deficit present.     Mental Status: She is alert.  Psychiatric:        Mood and Affect: Mood normal.        Behavior: Behavior normal.        Thought Content: Thought content normal.        Judgment: Judgment normal.   CBC    Component Value Date/Time   WBC 3.0 (L) 12/14/2021 1323   RBC 3.74 (L) 12/14/2021 1323   HGB 12.5 12/14/2021 1323   HGB 14.8 05/10/2017 1535   HCT 39.3 12/14/2021 1323   HCT 42.0 05/10/2017 1535   PLT 173 12/14/2021 1323   PLT 202 05/10/2017 1535   MCV 105.1 (H) 12/14/2021 1323   MCV 96 05/10/2017 1535   MCH 33.4 12/14/2021 1323   MCHC 31.8 12/14/2021 1323   RDW 13.0  12/14/2021 1323   RDW 13.9 05/10/2017 1535   LYMPHSABS 0.9 12/14/2021 1323   LYMPHSABS 1.8 05/10/2017 1535   MONOABS 0.2 12/14/2021 1323   EOSABS 0.0 12/14/2021 1323   EOSABS 0.1 05/10/2017 1535   BASOSABS 0.1 12/14/2021 1323   BASOSABS 0.1 05/10/2017 1535      Latest Ref Rng & Units 12/14/2021    1:23 PM 12/08/2021    2:22 PM 07/09/2020    4:18 PM  BMP  Glucose 70 - 99 mg/dL 235    184    BUN 8 - 23 mg/dL 31    22    Creatinine 0.44 - 1.00 mg/dL 1.12   1.10   0.81    Sodium 135 - 145 mmol/L 135    134    Potassium 3.5 - 5.1 mmol/L 4.7    3.6    Chloride 98 - 111 mmol/L 98  98    CO2 22 - 32 mmol/L 31    26    Calcium 8.9 - 10.3 mg/dL 9.2    8.9     Chest imaging: HRCT Chest 02/27/22 1. Progressive interstitial lung disease which is basal predominant, as above, technically categorized as indeterminate for usual interstitial pneumonia (UIP) per current ATS guidelines. 2. Diffuse bronchial wall thickening with moderate centrilobular and paraseptal emphysema; imaging findings suggestive of concurrent COPD. 3. Dilatation of the pulmonic trunk (3.5 cm in diameter), concerning for associated pulmonary arterial hypertension. 4. Several nodular areas of architectural distortion are noted. Some of these are chronic and presumably benign, while others have enlarged, including both smoothly marginated and irregular appearing aggressive lesions, most notable for a 1.3 x 0.9 cm lesion in the medial aspect of the right lower lobe. Close attention on follow-up studies is strongly recommended. Alternatively, if there is clinical concern for malignancy at this time, further evaluation with PET-CT could be considered if clinically appropriate. 5. Aortic atherosclerosis, in addition to 2 vessel coronary artery disease. 6. There are calcifications of the aortic valve and mitral annulus. Echocardiographic correlation for evaluation of potential valvular dysfunction may be warranted if  clinically indicated.  CT Chest w contrast 12/08/21 1. Patchy airspace opacity predominantly within the peripheral aspect of the right lower lobe and right middle lobe with associated interlobular septal thickening and hazy ground-glass attenuation. Findings are favored to represent multifocal pneumonia over asymmetric edema. Radiographic follow-up to resolution is recommended. 2. Solid-appearing 1.1 cm lesion emanating from the anteromedial aspect of the upper pole of the left kidney, suspicious for small renal cell carcinoma. Retrospectively, this lesion appears slightly enhancing on recent MRI of the abdomen from 10/20/2021. Urology consultation is recommended. 3. Solitary 5 mm noncalcified pulmonary nodule in the periphery of the right upper lobe. Non-contrast chest CT can be considered in 12 months if patient is high-risk. This recommendation follows the consensus statement: Guidelines for Management of Incidental Pulmonary Nodules Detected on CT Images: From the Fleischner Society 2017; Radiology 2017; 284:228-243. 4. Mild cardiomegaly with coronary artery and aortic atherosclerosis (ICD10-I70.0). 5. Mildly dilated main pulmonary trunk, which can be seen in the setting of pulmonary arterial hypertension.  CT Chest 12/07/19 Motion artifact limits evaluation of the bilateral lower lobes.   AIRWAYS, LUNGS, PLEURA: Clear central tracheobronchial tree. Emphysema. Linear atelectasis in the lingula. No focal consolidation. No pleural effusion.   Numerous pulmonary nodules are unchanged from prior imaging. Reference nodules:  -Right lower lobe nodule measuring 1.1 cm (3:64), unchanged  -Right upper lobe nodule measuring 0.7 cm (3:22), unchanged  -Scarring at the left lung apex (3:8), unchanged   THYROID: Stable multinodular thyroid.   MEDIASTINUM: Cardiomegaly. No pericardial effusion. Aortic and coronary atherosclerotic disease. Normal caliber thoracic aorta. Enlarged main pulmonary  artery measuring up to 3.2 cm. No mediastinal lymphadenopathy. Small sliding hiatal hernia.   PFT:    Latest Ref Rng & Units 04/15/2022   12:48 PM 05/25/2016    2:49 PM  PFT Results  FVC-Pre L 1.83  P 1.95    FVC-Predicted Pre % 109  P 102    FVC-Post L 1.96  P 1.98    FVC-Predicted Post % 117  P 104    Pre FEV1/FVC % % 62  P 65    Post FEV1/FCV % % 64  P 65    FEV1-Pre L 1.13  P 1.27    FEV1-Predicted Pre % 86  P 87    FEV1-Post L 1.25  P 1.29    DLCO uncorrected ml/min/mmHg 7.30  P 7.98    DLCO UNC% % 39  P 32    DLCO corrected ml/min/mmHg 7.30  P   DLCO COR %Predicted % 39  P   DLVA Predicted % 51  P 53    TLC L 4.58  P 4.42    TLC % Predicted % 90  P 87    RV % Predicted % 107  P 102      P Preliminary result  PFTs 2017: Mild obstruction and severe diffusion defect  6 Minute Walk Testing: 6MWT 05/2020: 100 ?meters. HR 73->92, O2 sat 94% 2L->92% 2L. 6MWT 09/2017: 198 meters, O2 sat 95->87% Ra, then 91-> 87% 2L of oxgyen used with exertion.  6MWT 07/2017: 104 meters. O2 sat 96%->83% 6MWT 04/2017: 46 meters.  Labs: 06/07/20 CCP ab - negative RF - < 3.5 ENA 0.1 ANA - negative Path:  Echo 08/27/21: LV EF 50-55%. Mild concentric hypertrophy. Gradie II diastolic dysfunction. LA moderately dilated. Moderate tricuspid regurge. PASP 6mHg.   Heart Catheterization 04/29/2017: RA pressure 8/2, mean 2 mmHg. RV 78/1, EDP 3 mmHg. PA 80/18, mean 42 mmHg. PW 13/14, mean 11 mmHg. PA saturation was 73%, aortic saturation 98%. Cardiac output 4.88, cardiac index 2.85 by Fick.  Cardiac testing summary: TTE 01/04/20 (cone): LA mod-sev dil, EF 60-65%, grade 2 DD, mild MR RV normal, mod TR, est PASP 54 No effusion. IVC normal size, poor insp collapse  TTE 08/2017: LA 36, EF 65-70%, mod LVH, mild-mod MR RV normal, severe Tr, abnormal septal, est PASP 75+CVP No effusion. IVC dilated.  RWellman6/2018 (cone): RA 2, PA 80/18 (42), PCW 11 CO/CI 4.9/2.9 (f), PA sat 73%, PVR 6.3 WU RHC 03/2017  (cone): RA 14, PA 109/30 (59), PCW 34, LVEDP 20 CO/CI 5.3/3.0 (f), PA sat 74% (f) Cors with distal LAD 50-60% lesion, PL with 80% lesion, LCx with 40% TTE 02/2017: LA 41, EF64%, LV small, mild LVH, mild-mod MR RV dil/HK, mod-severe Tr, abnormal septal, est PASP 80 IVC dilated/poor insp collapse  TTE 05/2015: RV mild dil, normal fxn, est PASP 42  Assessment & Plan:   No diagnosis found.  Discussion: GMischelle Reegis an 88year old woman, former smoker with primary biliary cirrhosis, DMII, CAD, CHF, emphysema and pulmonary arterial hypertension who returns to pulmonary clinic for follow up.  She remains stable at this time. HRCT Chest shows progressive interstitial lung disease primarily at the bases and multiple enlarging pulmonary nodules.   We had long discussion regarding any potential aggressive workup and what the potential treatments would entail. The patient and her daughters are in agreement with focusing on quality of life at this time. No further workup needed for the pulmonary nodules or interstitial lung disease.   We will place referral to palliative care for further assistance with care planning.  She is to continue macitentan 165mdaily for her pulmonary arterial hypertension. She is to continue lasix 6067maily as well. She will continue to follow with cardiology.  Follow up as needed.  JonFreda JacksonD [facility] Office: [phone] Current Outpatient Medications:    acetaminophen (TYLENOL) 500 MG tablet, Take 500 mg by mouth every 8 (eight) hours as needed for mild pain or moderate pain., Disp: , Rfl:    ALPRAZolam (XANAX) 0.5 MG tablet, Take 0.5 mg by mouth 2 (two) times daily as needed for anxiety. , Disp: , Rfl:    atorvastatin (LIPITOR)  10 MG tablet, Take 1 tablet (10 mg total) by mouth daily., Disp: 30 tablet, Rfl: 0   cholecalciferol (VITAMIN D) 1000 UNITS tablet, Take 2,000 Units by mouth every Chambers., Disp: , Rfl:    citalopram  (CELEXA) 20 MG tablet, Take 1 tablet by mouth daily., Disp: , Rfl:    clopidogrel (PLAVIX) 75 MG tablet, Take 75 mg by mouth daily., Disp: , Rfl:    docusate sodium (COLACE) 100 MG capsule, Take 100 mg by mouth 2 (two) times daily., Disp: , Rfl:    feeding supplement, GLUCERNA SHAKE, (GLUCERNA SHAKE) LIQD, Take 237 mLs by mouth daily as needed. , Disp: , Rfl:    FREESTYLE LITE test strip, 1 each 3 (three) times daily., Disp: , Rfl:    furosemide (LASIX) 40 MG tablet, Take 1.5 tablets (60 mg total) by mouth daily., Disp: 135 tablet, Rfl: 3   hydrALAZINE (APRESOLINE) 10 MG tablet, TAKE 1 TABLET IN THE Chambers AND AT BEDTIME, Disp: 180 tablet, Rfl: 3   insulin aspart (NOVOLOG) 100 UNIT/ML FlexPen, Inject 5 Units into the skin 3 (three) times daily with meals. When BG is over 100, Disp: , Rfl:    insulin glargine (LANTUS) 100 UNIT/ML Solostar Pen, Inject 8 Units into the skin at bedtime. , Disp: , Rfl:    loratadine (CLARITIN) 10 MG tablet, Take 10 mg by mouth daily., Disp: , Rfl:    macitentan (OPSUMIT) 10 MG tablet, Take 1 tablet (10 mg total) by mouth daily., Disp: 90 tablet, Rfl: 3   metoprolol tartrate (LOPRESSOR) 25 MG tablet, Take 12.5 mg by mouth 2 (two) times daily. 12.77m in the AM, 12.536min the PM, Disp: , Rfl:    mirtazapine (REMERON) 30 MG tablet, Take 30 mg by mouth at bedtime., Disp: , Rfl:    Multiple Vitamin (MULTIVITAMIN) tablet, Take 1 tablet by mouth daily. Reported on 02/06/2016, Disp: , Rfl:    olmesartan (BENICAR) 5 MG tablet, TAKE 1 TABLET DAILY, Disp: 90 tablet, Rfl: 3   pantoprazole (PROTONIX) 40 MG tablet, TAKE 1 TABLET TWICE A DAY BEFORE MEALS (Patient taking differently: 40 mg daily.), Disp: 180 tablet, Rfl: 3   Polyethyl Glycol-Propyl Glycol (SYSTANE OP), Apply to eye as needed., Disp: , Rfl:    potassium chloride (KLOR-CON) 10 MEQ tablet, TAKE 1 TABLET DAILY, Disp: 90 tablet, Rfl: 3   psyllium (METAMUCIL SMOOTH TEXTURE) 28 % packet, Take 1 packet by mouth as needed. ,  Disp: , Rfl:    sodium chloride (OCEAN) 0.65 % SOLN nasal spray, Place 1 spray into both nostrils as needed for congestion., Disp: , Rfl:    thyroid (ARMOUR) 15 MG tablet, Take 15 mg by mouth daily., Disp: , Rfl:    umeclidinium bromide (INCRUSE ELLIPTA) 62.5 MCG/INH AEPB, Inhale 1 puff into the lungs daily., Disp: , Rfl:    ursodiol (ACTIGALL) 500 MG tablet, Take 1 tablet (500 mg total) by mouth 2 (two) times daily with a meal., Disp: 180 tablet, Rfl: 3

## 2022-04-19 ENCOUNTER — Encounter: Payer: Self-pay | Admitting: Pulmonary Disease

## 2022-04-22 ENCOUNTER — Telehealth: Payer: Self-pay | Admitting: Pulmonary Disease

## 2022-04-22 DIAGNOSIS — I10 Essential (primary) hypertension: Secondary | ICD-10-CM | POA: Diagnosis not present

## 2022-04-22 NOTE — Telephone Encounter (Signed)
Called and spoke with patients daughter and informed her that the order was placed on 5/31 by Dr Erin Fulling and that she should be hearing something soon regarding this order. Daughter verbalized understanding. Nothing further needed

## 2022-04-29 ENCOUNTER — Telehealth: Payer: Self-pay | Admitting: Pulmonary Disease

## 2022-04-29 ENCOUNTER — Telehealth: Payer: Self-pay | Admitting: Nurse Practitioner

## 2022-04-29 NOTE — Telephone Encounter (Signed)
Spoke with patient's 2 daughters, Brealyn Baril & Denton Ar on a conference call and we discussed the Palliative referral/services and all questions were answered and they were in agreement with beginning services for patient.  I have scheduled a Zoom Consult for 05/05/22 @ 2:30 PM

## 2022-05-01 NOTE — Telephone Encounter (Signed)
[name] daughter states they have gotten palliative care scheduled. [name] phone number is [phone].

## 2022-05-01 NOTE — Telephone Encounter (Signed)
Called and  left voicemail for patients daughter to call office back in regards to the palliative care referral.   [facility] is supposed to be at her house on  05/05/2022 At 2:0pm  Patients daughter wants to go through another company. Need to find out what company she wants if she has a preference.

## 2022-05-01 NOTE — Telephone Encounter (Signed)
Called and spoke with [name]. She confirmed they were able to get everything setup and scheduled for the patient. She denied any additional questions.   Nothing further needed at time of call.

## 2022-05-05 ENCOUNTER — Other Ambulatory Visit: Payer: Medicare Other | Admitting: Nurse Practitioner

## 2022-05-05 ENCOUNTER — Encounter: Payer: Self-pay | Admitting: Nurse Practitioner

## 2022-05-05 DIAGNOSIS — J449 Chronic obstructive pulmonary disease, unspecified: Secondary | ICD-10-CM

## 2022-05-05 DIAGNOSIS — Z515 Encounter for palliative care: Secondary | ICD-10-CM

## 2022-05-05 DIAGNOSIS — R0602 Shortness of breath: Secondary | ICD-10-CM

## 2022-05-05 DIAGNOSIS — I272 Pulmonary hypertension, unspecified: Secondary | ICD-10-CM

## 2022-05-05 NOTE — Progress Notes (Signed)
[facility] Consult Note Telephone: [phone]  Fax: [phone]   Date of encounter: 05/05/22 5:47 PM PATIENT NAME: [name] [address] [place] [address]   [phone] (home)  DOB: 05-20-1931 MRN: [id number] PRIMARY CARE PROVIDER:    [name], DO,  [address] [place] [address] [phone]  REFERRING PROVIDER:   Janie Chambers, [place],  [place] [address] [phone]  RESPONSIBLE PARTY:    Contact Information     Name Relation Home Work [place] Daughter [phone]  [phone]   Michele, Chambers [phone]  [phone]   [name] Daughter [phone]  [phone]      Due to the COVID-19 crisis, this visit was done via telemedicine from my office and it was initiated and consent by this patient and or family.  I connected with 3 daughter, son with  [name] OR PROXY on 05/05/22 by a video enabled telemedicine application and verified that I am speaking with the correct person using two identifiers.   I discussed the limitations of evaluation and management by telemedicine. The patient expressed understanding and agreed to proceed.  Palliative Care was asked to follow this patient by consultation request of  Michele Morning, DO to address advance care planning and complex medical decision making. This is the initial visit.                        ASSESSMENT AND PLAN / RECOMMENDATIONS:  Symptom Management/Plan: 1. Advance Care Planning;  DNR 2. Goals of Care: Goals include to maximize quality of life and symptom management. Our advance care planning conversation included a discussion about:    The value and importance of advance care planning  Exploration of personal, cultural or spiritual beliefs that might influence medical decisions  Exploration of goals of care in the event of a sudden injury or illness  Identification  and preparation of a healthcare agent  Review and updating or creation of an advance directive document. 3. Shortness of breath secondary to COPD, pulmonary fibrosis, continue with inhalation therapy as needed, supplemental O2, continue to monitor respiratory status 04/15/2022 Weight 148 lbs BMI 25.47 4. Palliative care encounter; Palliative care encounter; Palliative medicine team will continue to support patient, patient's family, and medical team. Visit consisted of counseling and education dealing with the complex and emotionally intense issues of symptom management and palliative care in the setting of serious and potentially life-threatening illness Follow up Palliative Care Visit: Palliative care will continue to follow for complex medical decision making, advance care planning, and clarification of goals. Return 4 weeks or prn.  I spent 63 minutes providing this consultation. More than 50% of the time in this consultation was spent in counseling and care coordination. PPS: 50% Chief Complaint: Follow up palliative consult for complex medical decision making, address goals, manage ongoing symptoms  HISTORY OF PRESENT ILLNESS:  [name] is a 88 y.o. year old female  with multiple medical problems including pulmonary fibrosis, COPD, emphysema, HTN, CAD, DM, HLD, h/o GI bleed, +ANA, h/o breast cancer (1984) s/p mastectomy with chemo, NO- XRT, Multiple pulmonary nodules, stable since 09/2017, Cirrhosis by imaging, with no history of ascites, no varices on EGD, no splenomegaly on imaging, normal synthetic dysfunction. Quit smoking in 2014;  smoked half a pack to a pack per day for 40 years. Previously worked in a Secretary/administrator, Stage manager for 13  years. Seen at [facility] by Dr. Daphane Chambers, last seen on 06/07/20. Her PAH was classified as Group 1 with concern for idiopathic vs portopulmonary vs secondary connective tissue disease. On 3L supplemental oxygen 24/hrs with no increase in her  oxygen since being treated for pneumonia with multiple rounds of antibiotics since last summer. Most recently treated with course of doxycycline in 11/2021. CT Chest 12/08/21 showed patchy airspace opacity predominantly within the peripheral aspect of the right lower lobe and right middle lobe. There was also interlobular septal thickening and hazy ground-glass attenuation within these regions. There is also patchy airspace opacity within the lingula. 35m pulmonary nodule of the right upper lobe. I connected by video with [name] 3 daughters [name] son by telemedicine video for initial pc visit. We talked about purpose pc visit, past medical history, last time [name] has been independent. We talked about recent dx, treatment of pna with hospitalization. We talked about past medical history, family/social hx. We talked about ros, symptoms including shortness of breath, O2, distance that she can ambulate, fatigue, weakness. We talked about [name] ambulating with a cane outside, she resides with daughter KSantiago Gladand her son. We talked about [name] being able to get herself dressed, bath, make her own meals, feed herself with fair appetite. We talked about medical goals including medicare benefit of hospice for informational session. Family and [name] talked about wish to continue with PC, explained PC role in poc. We talked about quality of life, [name] feels "good" now. We talked about f/u pc visit, supportive with questions answered, scheduled. Contact information provided  History obtained from review of EMR, discussion with Ms. GMontante3 daughters [name] son and [name] by video/ I reviewed available labs, medications, imaging, studies and related documents from the EMR.  Records reviewed and summarized above.   ROS 10 point system reviewed all negative except HPI  Physical Exam: deferred CURRENT PROBLEM LIST:  Patient Active Problem List   Diagnosis  Date Noted   Renal mass 12/17/2021   Other disorders of kidney and ureter in diseases classified elsewhere 12/17/2021   Dyspepsia 04/02/2021   Gastroesophageal reflux disease 09/25/2020   Globus sensation 04/20/2019   Acute on chronic respiratory failure with hypoxia ([place]    CAP (community acquired pneumonia) 07/19/2018   Flatulence/wind 07/06/2018   Pulmonary hypertension ([facility] 04/25/2017   Dysphagia 07/30/2016   Hypothyroid 12/16/2015   Cirrhosis of liver ([facility] 07/17/2015   Congestive heart disease ([facility]    Type 2 diabetes mellitus without complication (HCC)    Diastolic CHF, chronic ([facility] 10/25/2014   Bloating 06/27/2014   SVT (supraventricular tachycardia) ([facility] 04/17/2014   Pressure ulcer, heel, right, unstageable ([facility] 04/17/2014   Anemia 04/17/2014   Fracture of femoral neck, right, closed ([facility] 03/28/2014   Hip fracture ([facility] 037/34/2876  Acute diastolic congestive heart failure ([facility] 03/21/2014   Abnormal carotid duplex scan 02/20/2014   Orthostatic hypotension 02/20/2014   Right sided weakness 02/16/2014   Loss of weight 06/23/2013   Primary biliary cirrhosis (HFidelity 06/12/2013   Leg weakness, bilateral 06/12/2013   PSVT (paroxysmal supraventricular tachycardia) ([facility] 06/12/2013   Chronic cough 11/28/2011   Dyspnea 10/21/2011   H/O tobacco use, presenting hazards to health 10/21/2011   Coronary artery disease involving native coronary artery of native heart with angina pectoris ([facility]amond[facility] 04/15/2010   Type 2 diabetes mellitus ([facility] 07/30/2008   TREMOR, LEFT HAND 05/07/2008   SCAR CONDITION AND  FIBROSIS OF SKIN 03/05/2008   Hyperlipidemia 10/11/2006   Anxiety state 10/11/2006   Depression 10/11/2006   Essential hypertension 10/11/2006   OSTEOPENIA 10/11/2006   NEOPLASM, MALIGNANT, BREAST, HX OF 10/11/2006   PAST MEDICAL HISTORY:  Active Ambulatory Problems    Diagnosis Date Noted   Type 2 diabetes mellitus ([place]) 07/30/2008   Hyperlipidemia 10/11/2006   Anxiety  state 10/11/2006   Depression 10/11/2006   Essential hypertension 10/11/2006   Coronary artery disease involving native coronary artery of native heart with angina pectoris ([place]) 04/15/2010   SCAR CONDITION AND FIBROSIS OF SKIN 03/05/2008   OSTEOPENIA 10/11/2006   TREMOR, LEFT HAND 05/07/2008   NEOPLASM, MALIGNANT, BREAST, HX OF 10/11/2006   Dyspnea 10/21/2011   H/O tobacco use, presenting hazards to health 10/21/2011   Chronic cough 11/28/2011   Primary biliary cirrhosis ([place]) 06/12/2013   Leg weakness, bilateral 06/12/2013   PSVT (paroxysmal supraventricular tachycardia) ([place]) 06/12/2013   Loss of weight 06/23/2013   Right sided weakness 02/16/2014   Abnormal carotid duplex scan 02/20/2014   Orthostatic hypotension 02/20/2014   Hip fracture ([place]) 14/43/1540   Acute diastolic congestive heart failure ([place]) 03/21/2014   Fracture of femoral neck, right, closed ([place]) 03/28/2014   SVT (supraventricular tachycardia) ([place]) 04/17/2014   Pressure ulcer, heel, right, unstageable ([place]) 04/17/2014   Anemia 04/17/2014   Bloating 08/67/6195   Diastolic CHF, chronic ([place]) 10/25/2014   Type 2 diabetes mellitus without complication ([place])    Congestive heart disease ([place])    Cirrhosis of liver ([place]) 07/17/2015   Hypothyroid 12/16/2015   Dysphagia 07/30/2016   Pulmonary hypertension ([place]) 04/25/2017   Flatulence/wind 07/06/2018   CAP (community acquired pneumonia) 07/19/2018   Acute on chronic respiratory failure with hypoxia ([place])    Globus sensation 04/20/2019   Gastroesophageal reflux disease 09/25/2020   Dyspepsia 04/02/2021   Renal mass 12/17/2021   Other disorders of kidney and ureter in diseases classified elsewhere 12/17/2021   Resolved Ambulatory Problems    Diagnosis Date Noted   UPPER GASTROINTESTINAL HEMORRHAGE 03/21/2007   Syncope and collapse 04/15/2010   Hyponatremia 05/30/2013   Tachycardia 05/30/2013   Hypotension 05/30/2013   Dehydration 05/30/2013   Fall 05/30/2013    Hypomagnesemia 06/12/2013   Acute delirium 06/23/2013   UTI (lower urinary tract infection) 02/16/2014   Accelerated hypertension 02/18/2014   Hip fracture, right ([place]) 03/21/2014   Preoperative cardiovascular examination 03/22/2014   Chest pain 04/17/2014   Colitis 10/25/2014   Nausea & vomiting 10/25/2014   AKI (acute kidney injury) ([place]) 10/25/2014   Diarrhea of infectious origin 10/25/2014   Rectal bleeding    Acute colitis    Acute diastolic heart failure ([place]) 06/11/2015   CAP (community acquired pneumonia) 12/24/2015   PUD (peptic ulcer disease) 12/22/2016   Past Medical History:  Diagnosis Date   Anxiety    Cancer ([place])    Carotid artery disease ([place])    Chronic headaches    Chronic liver disease    Coronary atherosclerosis of native coronary artery    Essential hypertension, benign    History of breast cancer    History of GI bleed    Osteopenia    Parotid tumor    Pneumonia 11/2016   Pneumonia    SOCIAL HX:  Social History   Tobacco Use   Smoking status: Former    Packs/day: 0.50    Years: 40.00    Total pack years: 20.00    Types: Cigarettes    Quit date: 03/30/2013  Years since quitting: 9.1   Smokeless tobacco: Never  Substance Use Topics   Alcohol use: No    Alcohol/week: 0.0 standard drinks of alcohol   FAMILY HX:  Family History  Problem Relation Age of Onset   Heart failure Mother    Kidney cancer Mother    Cancer Mother    Hypertension Mother    Lung cancer Father        Brain METS, smoked   Cancer Father    Kidney cancer Sister    Cancer Sister    Hypertension Sister    Cirrhosis Brother    Diabetes Son    Heart attack Neg Hx    Stroke Neg Hx       ALLERGIES:  Allergies  Allergen Reactions   Aricept [Donepezil Hcl]     Hives, vomiting and headache   Namenda [Memantine Hcl]     hives   Adcirca [Tadalafil] Other (See Comments)    Caused blindness in one eye   Donepezil Nausea Only   Spironolactone Other (See Comments)     Dizziness, balance problems   Codeine Rash   Indocin [Indomethacin] Rash   Latex Rash   Penicillins Rash     PERTINENT MEDICATIONS:  Outpatient Encounter Medications as of 05/05/2022  Medication Sig   acetaminophen (TYLENOL) 500 MG tablet Take 500 mg by mouth every 8 (eight) hours as needed for mild pain or moderate pain.   ALPRAZolam (XANAX) 0.5 MG tablet Take 0.5 mg by mouth 2 (two) times daily as needed for anxiety.    atorvastatin (LIPITOR) 10 MG tablet Take 1 tablet (10 mg total) by mouth daily.   cholecalciferol (VITAMIN D) 1000 UNITS tablet Take 2,000 Units by mouth every Chambers.   citalopram (CELEXA) 20 MG tablet Take 1 tablet by mouth daily.   clopidogrel (PLAVIX) 75 MG tablet Take 75 mg by mouth daily.   docusate sodium (COLACE) 100 MG capsule Take 100 mg by mouth 2 (two) times daily.   feeding supplement, GLUCERNA SHAKE, (GLUCERNA SHAKE) LIQD Take 237 mLs by mouth daily as needed.    FREESTYLE LITE test strip 1 each 3 (three) times daily.   furosemide (LASIX) 40 MG tablet Take 1.5 tablets (60 mg total) by mouth daily.   hydrALAZINE (APRESOLINE) 10 MG tablet TAKE 1 TABLET IN THE Chambers AND AT BEDTIME   insulin aspart (NOVOLOG) 100 UNIT/ML FlexPen Inject 5 Units into the skin 3 (three) times daily with meals. When BG is over 100   insulin glargine (LANTUS) 100 UNIT/ML Solostar Pen Inject 8 Units into the skin at bedtime.    loratadine (CLARITIN) 10 MG tablet Take 10 mg by mouth daily.   macitentan (OPSUMIT) 10 MG tablet Take 1 tablet (10 mg total) by mouth daily.   metoprolol tartrate (LOPRESSOR) 25 MG tablet Take 12.5 mg by mouth 2 (two) times daily. 12.40m in the AM, 12.554min the PM   mirtazapine (REMERON) 30 MG tablet Take 30 mg by mouth at bedtime.   Multiple Vitamin (MULTIVITAMIN) tablet Take 1 tablet by mouth daily. Reported on 02/06/2016   olmesartan (BENICAR) 5 MG tablet TAKE 1 TABLET DAILY   pantoprazole (PROTONIX) 40 MG tablet TAKE 1 TABLET TWICE A DAY BEFORE MEALS  (Patient taking differently: 40 mg daily.)   Polyethyl Glycol-Propyl Glycol (SYSTANE OP) Apply to eye as needed.   potassium chloride (KLOR-CON) 10 MEQ tablet TAKE 1 TABLET DAILY   psyllium (METAMUCIL SMOOTH TEXTURE) 28 % packet Take 1 packet by mouth as  needed.    sodium chloride (OCEAN) 0.65 % SOLN nasal spray Place 1 spray into both nostrils as needed for congestion.   thyroid (ARMOUR) 15 MG tablet Take 15 mg by mouth daily.   umeclidinium bromide (INCRUSE ELLIPTA) 62.5 MCG/INH AEPB Inhale 1 puff into the lungs daily.   ursodiol (ACTIGALL) 500 MG tablet Take 1 tablet (500 mg total) by mouth 2 (two) times daily with a meal.   No facility-administered encounter medications on file as of 05/05/2022.   Thank you for the opportunity to participate in the care of Ms. Magana.  The palliative care team will continue to follow. Please call our office at [phone] if we can be of additional assistance.   Shawntee Mainwaring Z Ingri Diemer, NP ,

## 2022-05-06 ENCOUNTER — Ambulatory Visit: Payer: Medicare Other | Admitting: Internal Medicine

## 2022-05-08 ENCOUNTER — Other Ambulatory Visit: Payer: Self-pay | Admitting: Cardiology

## 2022-05-22 ENCOUNTER — Telehealth: Payer: Self-pay | Admitting: Nurse Practitioner

## 2022-05-22 DIAGNOSIS — I10 Essential (primary) hypertension: Secondary | ICD-10-CM | POA: Diagnosis not present

## 2022-05-22 NOTE — Telephone Encounter (Signed)
Michele Rocher, [name] daughters called sharing [name] has become weak, loose stools, not acting like herself. We revisited goc, reviewed MOST form, talked more about option of Hospice. We talked about going to ED for labs, fluids if needs possible abx. We talked about aggressive vs conservative vs comfort care. We re-visited PC initial visit with [name] and family wishes to wait on hospice, she felt good, symptoms were not as severe as previously, she was able to get around. We talked about overall changes, progression and possible if this is her new baseline then maybe time to re-visit hospice. We talked about quality of life, what suffering looks like to them including intubation/ventilator at age 88yo with severe pulmonary disease. We talked about if labs, work up reveal in ED that clinical state is not able to be corrected to increase weakness (such as fluids for dehydration) or encephalopathy secondary to electrolytes, cirrhosis then consider home with hospice. Discussed if required admission to request Palliative consult to continue discussions of medical goals, when is it time for hospice, symptom management. Katharine Look and [name] were very thankful for discussion and will be bringing [name] to [facility] ED for further evaluation. Support provided Total time 25 minutes Telephone 15 minutes Documentation/review 10 minutes

## 2022-05-25 ENCOUNTER — Telehealth: Payer: Self-pay | Admitting: Nurse Practitioner

## 2022-05-25 ENCOUNTER — Encounter: Payer: Medicare Other | Admitting: Nurse Practitioner

## 2022-05-25 NOTE — Progress Notes (Signed)
This encounter was created in error - please disregard.

## 2022-05-25 NOTE — Telephone Encounter (Signed)
I received message Ms Shetterly declined to go to ED; discussed with [name] options. We talked about option of hospice evaluation; [name] endorses she will contact her siblings and discuss with Ms. Girdner and return call with whether to proceed with hospice  Total time 15 minutes Documentation 5 minutes Phone discussion 10 minutes

## 2022-05-26 ENCOUNTER — Other Ambulatory Visit: Payer: Medicare Other | Admitting: Nurse Practitioner

## 2022-05-27 ENCOUNTER — Other Ambulatory Visit: Payer: Medicare Other | Admitting: Nurse Practitioner

## 2022-05-29 ENCOUNTER — Other Ambulatory Visit: Payer: Medicare Other | Admitting: Nurse Practitioner

## 2022-05-29 ENCOUNTER — Encounter: Payer: Self-pay | Admitting: Nurse Practitioner

## 2022-05-29 DIAGNOSIS — Z515 Encounter for palliative care: Secondary | ICD-10-CM

## 2022-05-29 DIAGNOSIS — I272 Pulmonary hypertension, unspecified: Secondary | ICD-10-CM

## 2022-05-29 DIAGNOSIS — R0602 Shortness of breath: Secondary | ICD-10-CM | POA: Diagnosis not present

## 2022-05-29 DIAGNOSIS — J449 Chronic obstructive pulmonary disease, unspecified: Secondary | ICD-10-CM

## 2022-05-29 NOTE — Progress Notes (Signed)
[facility] Consult Note Telephone: [phone]  Fax: [phone]    Date of encounter: 05/29/22 5:53 PM PATIENT NAME: [name] [address] [place] [address]   [phone] (home)  DOB: [birth year] MRN: [id number] PRIMARY CARE PROVIDER:    [name], DO,  [address] [place] [address] [phone]  REFERRING PROVIDER:   [name], [place],  [place] [address] [phone]  RESPONSIBLE PARTY:    Contact Information     Name Relation Home Work Nicholson Daughter [phone]  [phone]   Maelynn, [place] [phone]  [phone]   [place] Ar Daughter [phone]  [phone]       Due to the COVID-19 crisis, this visit was done via telemedicine from my office and it was initiated and consent by this patient and or family.   I connected with 3 daughter, son with  KEALEY KEMMER OR PROXY on 05/05/22 by a video enabled telemedicine application and verified that I am speaking with the correct person using two identifiers.   I discussed the limitations of evaluation and management by telemedicine. The patient expressed understanding and agreed to proceed.  Palliative Care was asked to follow this patient by consultation request of  [name], DO to address advance care planning and complex medical decision making. This is the initial visit.                        ASSESSMENT AND PLAN / RECOMMENDATIONS:  Symptom Management/Plan: 1. Advance Care Planning;  DNR; refer to proceed with Hospice evaluation 2. Goals of Care: Goals include to maximize quality of life and symptom management. Our advance care planning conversation included a discussion about:    The value and importance of advance care planning  Exploration of personal, cultural or spiritual beliefs that might influence medical decisions  Exploration of goals of care in the event of  a sudden injury or illness  Identification and preparation of a healthcare agent  Review and updating or creation of an advance directive document. 3. Shortness of breath secondary to COPD, pulmonary fibrosis, continue with inhalation therapy as needed, supplemental O2, continue to monitor respiratory status 04/15/2022 Weight 148 lbs BMI 25.47 4. Palliative care encounter; Palliative care encounter; Palliative medicine team will continue to support patient, patient's family, and medical team. Visit consisted of counseling and education dealing with the complex and emotionally intense issues of symptom management and palliative care in the setting of serious and potentially life-threatening illness Follow up Palliative Care Visit: Palliative care will continue to follow for complex medical decision making, advance care planning, and clarification of goals. Return 4 weeks or prn.   I spent 46 minutes providing this consultation. More than 50% of the time in this consultation was spent in counseling and care coordination. PPS: 50% Chief Complaint: Follow up palliative consult for complex medical decision making, address goals, manage ongoing symptoms   HISTORY OF PRESENT ILLNESS:  [name] is a 88 y.o. year old female  with multiple medical problems including pulmonary fibrosis, COPD, emphysema, HTN, CAD, DM, HLD, h/o GI bleed, +ANA, h/o breast cancer (1984) s/p mastectomy with chemo, NO- XRT, Multiple pulmonary nodules, stable since 09/2017, Cirrhosis by imaging, with no history of ascites, no varices on EGD, no splenomegaly on imaging, normal synthetic dysfunction. Quit smoking in 2014;  smoked half a pack to a pack per day for 40 years.  Previously worked in a Secretary/administrator, Stage manager for 13 years. Seen at [facility] by Dr. Daphane Shepherd, last seen on 06/07/20. Her PAH was classified as Group 1 with concern for idiopathic vs portopulmonary vs secondary connective tissue disease. On 3L  supplemental oxygen 24/hrs with no increase in her oxygen since being treated for pneumonia with multiple rounds of antibiotics since last summer. Most recently treated with course of doxycycline in 11/2021. CT Chest 12/08/21 showed patchy airspace opacity predominantly within the peripheral aspect of the right lower lobe and right middle lobe. There was also interlobular septal thickening and hazy ground-glass attenuation within these regions. There is also patchy airspace opacity within the lingula. 60m pulmonary nodule of the right upper lobe. I connected by video with [name] 3 daughters KFilbert Schilderby telemedicine video for follow up pc visit. We talked about last PC visit. We talked about ros, symptoms including shortness of breath, O2, distance that she can ambulate, fatigue, weakness. We talked about disease progression, expectations. We talked about medical goals including medicare benefit of hospice, services provided. Family and [name] talked about wish to continue with hospice evaluation. We talked about quality of life. We talked about f/u pc visit, supportive with questions answered, scheduled. Contact information provided   History obtained from review of EMR, discussion with Ms. GHeld2 daughters KEarl ManyMs. Weisel by video/ I reviewed available labs, medications, imaging, studies and related documents from the EMR.  Records reviewed and summarized above.    ROS 10 point system reviewed all negative except HPI   Physical Exam: deferred  Thank you for the opportunity to participate in the care of [name].  The palliative care team will continue to follow. Please call our office at [phone] we can be of additional assistance.   Odai Wimmer ZIhor Gully NP

## 2022-06-05 ENCOUNTER — Telehealth: Payer: Self-pay | Admitting: Nurse Practitioner

## 2022-06-05 NOTE — Telephone Encounter (Signed)
I returned [name]'s call, Ms. Lords daughter and conference Katharine Look in to phone call. Brief update discussed about hospice av visit, also that will continue on pc for now, PC RN/SW to make a visit within the next 2 weeks, will then schedule visit with PC NP in 4 weeks.

## 2022-06-09 ENCOUNTER — Telehealth: Payer: Self-pay

## 2022-06-09 NOTE — Telephone Encounter (Signed)
425 pm.  Request from [place], NP to schedule a follow up visit with patient.  Phone call made to daughter [name].  No answer.  Message left requesting a call back.

## 2022-06-11 ENCOUNTER — Other Ambulatory Visit: Payer: Medicare Other

## 2022-06-11 VITALS — BP 130/50 | HR 55 | Wt 144.8 lb

## 2022-06-11 DIAGNOSIS — Z515 Encounter for palliative care: Secondary | ICD-10-CM

## 2022-06-11 NOTE — Progress Notes (Signed)
PATIENT NAME: [name] DOB: 12-05-1930 MRN: [id number]  PRIMARY CARE PROVIDER: [name], DO  RESPONSIBLE PARTY:  Acct ID - Guarantor Home Phone Work Phone Relationship Acct Type  [id number] Kingsley [phone]  Self P/F     [place], [place], [place] [address]    Home visit completed with patient her 2 daughters and son.  Patient was evaluated for hospice services 1 week ago but was not eligible for services.  ACP:  Reviewed MOST form with patient.  She desires CPR rather than DNR status.  Full interventions with IV fluids, antibiotics and tube feeding for a defined trial period listed on MOST.  Discussed HCPOA/POA and this has not been completed.  Patient is open to having documents sent to her for review and will discuss with her children.  Notified [place], SW of request.   Appetite:  Po intake remains good with 3 meals daily.  She will consume a Glucerna supplement drink on days her appetite is not good but this is not often. Patient is able to do light meal prep/cooking and dishes.  Today's weight is 144.8 lbs.  May 2023 weight was 148.4 lbs.  Over the last 3 years weights have fluctuated from 145-152.6 lbs.  Dyspnea:  Patient reports dyspnea when ambulating to the kitchen.  This resolves relatively quickly with rest.  Patient is currently on O2 at 2 L via [place].   DM:  Patient is checking her blood sugars routinely with readings ranging from 82-120.   No s/s of hypo/hyperglycemia.   Mobility:  Patient is ambulatory without assistive devices.  She has a wheelchair that she sits in during the day but this is mostly for her comfort as she likes to sit straight up.   No recent falls.  Medical Care:  Patient is well supported by her children.  [name] and Gwyndolyn Saxon are all present for the visit.  Patient is meticulous about her medical conditions, monitoring what she eats to manage her blood sugars, obtaining blood sugars, weights and vital signs daily and documenting  them in her notebook.    Update provided to [place], NP.   CODE STATUS: Full ADVANCED DIRECTIVES:  No MOST FORM: Yes PPS: 50%   PHYSICAL EXAM:   VITALS: Today's Vitals   06/11/22 1243  BP: (!) 130/50  Pulse: (!) 55  SpO2: 95%  Weight: 144 lb 12.8 oz (65.7 kg)  PainSc: 0-No pain    LUNGS:  crackles present to right lower lobe CARDIAC: Cor RRR}  EXTREMITIES: negative SKIN: Skin color, texture, turgor normal. No rashes or lesions or mobility and turgor normal  NEURO: negative       Lorenza Burton, RN

## 2022-06-15 ENCOUNTER — Ambulatory Visit (HOSPITAL_COMMUNITY)
Admission: RE | Admit: 2022-06-15 | Discharge: 2022-06-15 | Disposition: A | Discharge status: Home / Self Care | Payer: Medicare Other | Source: Ambulatory Visit | Attending: Physician Assistant | Admitting: Physician Assistant

## 2022-06-15 DIAGNOSIS — N29 Other disorders of kidney and ureter in diseases classified elsewhere: Secondary | ICD-10-CM | POA: Diagnosis not present

## 2022-06-15 DIAGNOSIS — N2889 Other specified disorders of kidney and ureter: Secondary | ICD-10-CM | POA: Diagnosis not present

## 2022-06-15 DIAGNOSIS — Z794 Long term (current) use of insulin: Secondary | ICD-10-CM | POA: Diagnosis not present

## 2022-06-15 DIAGNOSIS — E1142 Type 2 diabetes mellitus with diabetic polyneuropathy: Secondary | ICD-10-CM | POA: Diagnosis not present

## 2022-06-15 DIAGNOSIS — N281 Cyst of kidney, acquired: Secondary | ICD-10-CM | POA: Diagnosis not present

## 2022-06-15 DIAGNOSIS — N289 Disorder of kidney and ureter, unspecified: Secondary | ICD-10-CM | POA: Insufficient documentation

## 2022-06-15 DIAGNOSIS — B351 Tinea unguium: Secondary | ICD-10-CM | POA: Diagnosis not present

## 2022-06-17 ENCOUNTER — Ambulatory Visit: Payer: Medicare Other | Admitting: Urology

## 2022-06-17 ENCOUNTER — Ambulatory Visit: Payer: Medicare Other | Admitting: Internal Medicine

## 2022-06-19 ENCOUNTER — Ambulatory Visit (INDEPENDENT_AMBULATORY_CARE_PROVIDER_SITE_OTHER): Payer: Medicare Other | Admitting: Urology

## 2022-06-19 ENCOUNTER — Encounter: Payer: Self-pay | Admitting: Urology

## 2022-06-19 VITALS — BP 152/57 | HR 78

## 2022-06-19 DIAGNOSIS — N2889 Other specified disorders of kidney and ureter: Secondary | ICD-10-CM | POA: Diagnosis not present

## 2022-06-19 DIAGNOSIS — N289 Disorder of kidney and ureter, unspecified: Secondary | ICD-10-CM

## 2022-06-19 LAB — URINALYSIS, ROUTINE W REFLEX MICROSCOPIC
Bilirubin, UA: NEGATIVE
Glucose, UA: NEGATIVE
Ketones, UA: NEGATIVE
Leukocytes,UA: NEGATIVE
Nitrite, UA: NEGATIVE
Specific Gravity, UA: 1.015 (ref 1.005–1.030)
Urobilinogen, Ur: 0.2 mg/dL (ref 0.2–1.0)
pH, UA: 5.5 (ref 5.0–7.5)

## 2022-06-19 LAB — MICROSCOPIC EXAMINATION: Renal Epithel, UA: NONE SEEN /hpf

## 2022-06-19 NOTE — Progress Notes (Signed)
06/19/2022 12:13 PM   [name] 23-Feb-1931 [id number]  Referring provider: [name], DO [address] [place],  [place] [address]  Followup left renal mass   HPI: Ms [name] is a 88yo here for followup for a left renal mass. Left lower pole renal mass stable in size at 1.8cm. No significant LUTS. No hematuria   PMH: Past Medical History:  Diagnosis Date   Acute delirium 02/20/6545   Acute diastolic congestive heart failure ([facility]) 03/21/2014   Anxiety    Cancer ([facility])    Carotid artery disease ([facility])    Bilateral ICA occlusion   Chronic headaches    Chronic liver disease    ?cirrhosis on 10/2014 CT. H/O elevated alkphos and +AMA on URSO.   Coronary atherosclerosis of native coronary artery    Mild nonobstructive disease at cardiac catheterization May 2011   Depression    Essential hypertension, benign    Fracture of femoral neck, right, closed ([facility]) 03/28/2014   History of breast cancer    History of GI bleed    Hyperlipidemia    Orthostatic hypotension    Diabetic polyneuropathy/diabetic dysautonomia    Osteopenia    Parotid tumor    Pneumonia 11/2016   Pneumonia    Pressure ulcer, heel, right, unstageable ([facility]) 04/17/2014   PSVT (paroxysmal supraventricular tachycardia) ([facility])    Syncope and collapse 04/15/2010   Qualifier: Diagnosis of  By: Angelena Form, MD, Christopher     Type 2 diabetes mellitus ([facility])    UPPER GASTROINTESTINAL HEMORRHAGE 03/21/2007   Qualifier: Diagnosis of  By: Jonna Munro MD, Cornelius      Surgical History: Past Surgical History:  Procedure Laterality Date   ABDOMINAL HYSTERECTOMY     APPENDECTOMY     CATARACT EXTRACTION Bilateral    Dr. Gershon Crane   CHOLECYSTECTOMY  2008   COLONOSCOPY  2010   Dr. Oneida Alar: single tubular adenoma, one hyperplastic polyp. Next TCS 2020 health permitting.   ESOPHAGOGASTRODUODENOSCOPY N/A 08/17/2016   web in upper third of esophagus s/p dilation, five gastric ulcers without bleeding    ESOPHAGOGASTRODUODENOSCOPY N/A 01/01/2017    normal esophagus, gastritis   HEMORRHOID SURGERY  1960s   HIP ARTHROPLASTY Right 03/28/2014   Procedure: ARTHROPLASTY BIPOLAR HIP;  Surgeon: Carole Civil, MD;  Location: [facility];  Service: Orthopedics;  Laterality: Right;   LAPAROSCOPIC APPENDECTOMY N/A 04/13/2013   Procedure: APPENDECTOMY LAPAROSCOPIC;  Surgeon: Donato Heinz, MD;  Location: [facility];  Service: General;  Laterality: N/A;   MASTECTOMY  1994   Left breast -related to breast cancer   RIGHT HEART CATH N/A 04/29/2017   Procedure: Right Heart Cath;  Surgeon: Adrian Prows, MD;  Location: [facility];  Service: Cardiovascular;  Laterality: N/A;   RIGHT/LEFT HEART CATH AND CORONARY ANGIOGRAPHY N/A 04/13/2017   Procedure: RIGHT/LEFT HEART CATH AND CORONARY ANGIOGRAPHY;  Surgeon: Adrian Prows, MD;  Location: [facility];  Service: Cardiovascular;  Laterality: N/A;   SAVORY DILATION N/A 08/17/2016   Procedure: SAVORY DILATION;  Surgeon: Danie Binder, MD;  Location: [facility];  Service: Endoscopy;  Laterality: N/A;   VESICOVAGINAL FISTULA CLOSURE W/ TAH  5035   YAG LASER APPLICATION Left    Dr. Zadie Rhine    Home Medications:  Allergies as of 06/19/2022       Reactions   Aricept [donepezil Hcl]    Hives, vomiting and headache   Namenda [memantine Hcl]    hives   Adcirca [tadalafil] Other (See Comments)  Caused blindness in one eye   Donepezil Nausea Only   Spironolactone Other (See Comments)   Dizziness, balance problems   Codeine Rash   Indocin [indomethacin] Rash   Latex Rash   Penicillins Rash        Medication List        Accurate as of June 19, 2022 12:13 PM. If you have any questions, ask your nurse or doctor.          acetaminophen 500 MG tablet Commonly known as: TYLENOL Take 500 mg by mouth every 8 (eight) hours as needed for mild pain or moderate pain.   ALPRAZolam 0.5 MG tablet Commonly known as: XANAX Take 0.5 mg by mouth 2 (two) times  daily as needed for anxiety.   atorvastatin 10 MG tablet Commonly known as: LIPITOR Take 1 tablet (10 mg total) by mouth daily.   cholecalciferol 1000 units tablet Commonly known as: VITAMIN D Take 2,000 Units by mouth every Chambers.   citalopram 20 MG tablet Commonly known as: CELEXA Take 1 tablet by mouth daily.   clopidogrel 75 MG tablet Commonly known as: PLAVIX Take 75 mg by mouth daily.   docusate sodium 100 MG capsule Commonly known as: COLACE Take 100 mg by mouth 2 (two) times daily.   feeding supplement (GLUCERNA SHAKE) Liqd Take 237 mLs by mouth daily as needed.   FREESTYLE LITE test strip Generic drug: glucose blood 1 each 3 (three) times daily.   furosemide 40 MG tablet Commonly known as: LASIX Take 1.5 tablets (60 mg total) by mouth daily.   hydrALAZINE 10 MG tablet Commonly known as: APRESOLINE TAKE 1 TABLET IN THE Chambers AND AT BEDTIME   insulin aspart 100 UNIT/ML FlexPen Commonly known as: NOVOLOG Inject 5 Units into the skin 3 (three) times daily with meals. When BG is over 100   insulin glargine 100 UNIT/ML Solostar Pen Commonly known as: LANTUS Inject 8 Units into the skin at bedtime.   loratadine 10 MG tablet Commonly known as: CLARITIN Take 10 mg by mouth daily.   macitentan 10 MG tablet Commonly known as: OPSUMIT Take 1 tablet (10 mg total) by mouth daily.   metoprolol tartrate 25 MG tablet Commonly known as: LOPRESSOR Take 12.5 mg by mouth 2 (two) times daily. 12.11m in the AM, 12.530min the PM   mirtazapine 30 MG tablet Commonly known as: REMERON Take 30 mg by mouth at bedtime.   multivitamin tablet Take 1 tablet by mouth daily. Reported on 02/06/2016   olmesartan 5 MG tablet Commonly known as: BENICAR TAKE 1 TABLET DAILY   pantoprazole 40 MG tablet Commonly known as: PROTONIX TAKE 1 TABLET TWICE A DAY BEFORE MEALS What changed:  how much to take when to take this additional instructions   potassium chloride 10 MEQ  tablet Commonly known as: KLOR-CON TAKE 1 TABLET DAILY   psyllium 28 % packet Commonly known as: METAMUCIL SMOOTH TEXTURE Take 1 packet by mouth as needed.   sodium chloride 0.65 % Soln nasal spray Commonly known as: OCEAN Place 1 spray into both nostrils as needed for congestion.   SYSTANE OP Apply to eye as needed.   thyroid 15 MG tablet Commonly known as: ARMOUR Take 15 mg by mouth daily.   umeclidinium bromide 62.5 MCG/INH Aepb Commonly known as: INCRUSE ELLIPTA Inhale 1 puff into the lungs daily.   ursodiol 500 MG tablet Commonly known as: ACTIGALL Take 1 tablet (500 mg total) by mouth 2 (two) times daily with a  meal.        Allergies:  Allergies  Allergen Reactions   Aricept [Donepezil Hcl]     Hives, vomiting and headache   Namenda [Memantine Hcl]     hives   Adcirca [Tadalafil] Other (See Comments)    Caused blindness in one eye   Donepezil Nausea Only   Spironolactone Other (See Comments)    Dizziness, balance problems   Codeine Rash   Indocin [Indomethacin] Rash   Latex Rash   Penicillins Rash    Family History: Family History  Problem Relation Age of Onset   Heart failure Mother    Kidney cancer Mother    Cancer Mother    Hypertension Mother    Lung cancer Father        Brain METS, smoked   Cancer Father    Kidney cancer Sister    Cancer Sister    Hypertension Sister    Cirrhosis Brother    Diabetes Son    Heart attack Neg Hx    Stroke Neg Hx     Social History:  reports that she quit smoking about 9 years ago. Her smoking use included cigarettes. She has a 20.00 pack-year smoking history. She has never used smokeless tobacco. She reports that she does not drink alcohol and does not use drugs.  ROS: All other review of systems were reviewed and are negative except what is noted above in HPI  Physical Exam: BP (!) 152/57   Pulse 78   Constitutional:  Alert and oriented, No acute distress. HEENT: [place] AT, moist mucus membranes.   Trachea midline, no masses. Cardiovascular: No clubbing, cyanosis, or edema. Respiratory: Normal respiratory effort, no increased work of breathing. GI: Abdomen is soft, nontender, nondistended, no abdominal masses GU: No CVA tenderness.  Lymph: No cervical or inguinal lymphadenopathy. Skin: No rashes, bruises or suspicious lesions. Neurologic: Grossly intact, no focal deficits, moving all 4 extremities. Psychiatric: Normal mood and affect.  Laboratory Data: Lab Results  Component Value Date   WBC 3.0 (L) 12/14/2021   HGB 12.5 12/14/2021   HCT 39.3 12/14/2021   MCV 105.1 (H) 12/14/2021   PLT 173 12/14/2021    Lab Results  Component Value Date   CREATININE 1.12 (H) 12/14/2021    No results found for: "PSA"  No results found for: "TESTOSTERONE"  Lab Results  Component Value Date   HGBA1C 7.9 (H) 01/02/2016    Urinalysis    Component Value Date/Time   COLORURINE YELLOW 07/09/2020 1650   APPEARANCEUR Clear 12/17/2021 1546   LABSPEC 1.015 07/09/2020 1650   PHURINE 5.0 07/09/2020 1650   GLUCOSEU Negative 12/17/2021 1546   HGBUR LARGE (A) 07/09/2020 1650   HGBUR small 08/13/2008 0925   BILIRUBINUR Negative 12/17/2021 1546   KETONESUR NEGATIVE 07/09/2020 1650   PROTEINUR Negative 12/17/2021 1546   PROTEINUR 100 (A) 07/09/2020 1650   UROBILINOGEN 0.2 06/11/2015 1301   NITRITE Negative 12/17/2021 1546   NITRITE NEGATIVE 07/09/2020 1650   LEUKOCYTESUR Negative 12/17/2021 1546   LEUKOCYTESUR LARGE (A) 07/09/2020 1650    Lab Results  Component Value Date   LABMICR Comment 12/17/2021   BACTERIA FEW (A) 07/09/2020    Pertinent Imaging: Renal US 06/15/2022: Images reviewed and discussed with the patient  No results found for this or any previous visit.  No results found for this or any previous visit.  No results found for this or any previous visit.  No results found for this or any previous visit.  Results for orders  placed during the hospital encounter of  06/15/22  Ultrasound renal complete  Narrative CLINICAL DATA:  Follow-up MRI.  EXAM: RENAL / URINARY TRACT ULTRASOUND COMPLETE  COMPARISON:  10/20/2021.  FINDINGS: Right Kidney:  Renal measurements: 9.8 x 4.1 x 4.9 cm = volume: 101.62 mL. Echogenicity within normal limits. Scattered cysts are noted in the right kidney measuring up to 1.2 x 1.0 x 1.2 cm. No mass or hydronephrosis visualized.  Left Kidney:  Renal measurements: 9.6 x 5.1 x 4.5 cm = volume: 114.88 mL. Echogenicity within normal limits. There is a solid slightly hyperechoic mass in the mid left kidney measuring 1.8 x 1.8 x 2.0 cm. Cysts are present in the left kidney, the largest measuring 1.7 x 1.4 x 1.3 cm. No hydronephrosis.  Bladder:  Appears normal for degree of bladder distention.  Other:  Examination is limited due to patient's inability to perform breath holds.  IMPRESSION: 1. Solid slightly hyperechoic mass in the mid left kidney measuring 1.8 x 1.8 x 2.0 cm which corresponds with region of prior lesion on MRI. The possibility of neoplasm can not be excluded. Urology consultation is recommended. 2. Bilateral renal cysts.   Electronically Signed By: Brett Fairy M.D. On: 06/16/2022 00:02  No results found for this or any previous visit.  No results found for this or any previous visit.  No results found for this or any previous visit.   Assessment & Plan:    1. Renal lesion RTc 6 months with renal US - Urinalysis, Routine w reflex microscopic   No follow-ups on file.  Nicolette Bang, MD  [facility] [place]

## 2022-06-19 NOTE — Patient Instructions (Signed)

## 2022-06-22 DIAGNOSIS — I10 Essential (primary) hypertension: Secondary | ICD-10-CM | POA: Diagnosis not present

## 2022-06-24 ENCOUNTER — Other Ambulatory Visit: Payer: Medicare Other | Admitting: Nurse Practitioner

## 2022-06-24 ENCOUNTER — Encounter: Payer: Self-pay | Admitting: Nurse Practitioner

## 2022-06-24 DIAGNOSIS — R0602 Shortness of breath: Secondary | ICD-10-CM

## 2022-06-24 DIAGNOSIS — I272 Pulmonary hypertension, unspecified: Secondary | ICD-10-CM | POA: Diagnosis not present

## 2022-06-24 DIAGNOSIS — Z515 Encounter for palliative care: Secondary | ICD-10-CM

## 2022-06-24 DIAGNOSIS — J449 Chronic obstructive pulmonary disease, unspecified: Secondary | ICD-10-CM

## 2022-06-24 NOTE — Progress Notes (Signed)
[facility] Consult Note Telephone: [phone]  Fax: [phone]    Date of encounter: 06/24/22 7:15 PM PATIENT NAME: [name] [address] [place] [address]   [phone] (home)  DOB: 1931/03/13 MRN: [id number] PRIMARY CARE PROVIDER:    [name], DO,  [address] [place] [address] [phone]  REFERRING PROVIDER:   [name], [place],  [place] [address] [phone]  RESPONSIBLE PARTY:    Contact Information     Name Relation Home Work [place] Daughter [phone]  [phone]   Wilmarie, [place] [phone]  [phone]   [place] Ar Daughter [phone]  [phone]     Due to the COVID-19 crisis, this visit was done via telemedicine from my office and it was initiated and consent by this patient and or family.   I connected with 3 daughter, son with  [name] OR PROXY on 05/05/22 by a video enabled telemedicine application and verified that I am speaking with the correct person using two identifiers.   I discussed the limitations of evaluation and management by telemedicine. The patient expressed understanding and agreed to proceed.  Palliative Care was asked to follow this patient by consultation request of  [name], DO to address advance care planning and complex medical decision making. This is the initial visit.                        ASSESSMENT AND PLAN / RECOMMENDATIONS:  Symptom Management/Plan: 1. Advance Care Planning;  DNR; refer to proceed with Hospice evaluation 2. Goals of Care: Goals include to maximize quality of life and symptom management. Our advance care planning conversation included a discussion about:    The value and importance of advance care planning  Exploration of personal, cultural or spiritual beliefs that might influence medical decisions  Exploration of goals of care in the event of a  sudden injury or illness  Identification and preparation of a healthcare agent  Review and updating or creation of an advance directive document. 3. Shortness of breath secondary to COPD, pulmonary fibrosis, continue with inhalation therapy as needed, supplemental O2, continue to monitor respiratory status  4. Palliative care encounter; Palliative care encounter; Palliative medicine team will continue to support patient, patient's family, and medical team. Visit consisted of counseling and education dealing with the complex and emotionally intense issues of symptom management and palliative care in the setting of serious and potentially life-threatening illness Follow up Palliative Care Visit: Palliative care will continue to follow for complex medical decision making, advance care planning, and clarification of goals. Return 4 weeks or prn.   I spent 41 minutes providing this consultation. More than 50% of the time in this consultation was spent in counseling and care coordination. PPS: 50% Chief Complaint: Follow up palliative consult for complex medical decision making, address goals, manage ongoing symptoms   HISTORY OF PRESENT ILLNESS:  [name] is a 88 y.o. year old female  with multiple medical problems including pulmonary fibrosis, COPD, emphysema, HTN, CAD, DM, HLD, h/o GI bleed, +ANA, h/o breast cancer (1984) s/p mastectomy with chemo, NO- XRT, Multiple pulmonary nodules, stable since 09/2017, Cirrhosis by imaging, with no history of ascites, no varices on EGD, no splenomegaly on imaging, normal synthetic dysfunction. Quit smoking in 2014;  smoked half a pack to a pack per day for 40 years. Previously worked in a Secretary/administrator, blow  molded plastic for 13 years. I connected by video for telemedicine f/u pc visit. We talked about how [name] has been feeling, ros, symptoms, shortness of breath and pain currently being managed. We talked about functional abilities with able to make a  sandwich, plate her food, get dressed. We talked about appetite, weights. [name] continues to try to be independent as able. We talked about daily routine, quality of life. We talked about overall [name] has been stable. We talked medical goals, quality of life. We talked about f/u pc visit, supportive with questions answered, scheduled. Contact information provided   History obtained from review of EMR, discussion with [name] 3 daughters [name] [name] by video and son I reviewed available labs, medications, imaging, studies and related documents from the EMR.  Records reviewed and summarized above.    ROS 10 point system reviewed all negative except HPI   Physical Exam: deferred  Thank you for the opportunity to participate in the care of [name].  The palliative care team will continue to follow. Please call our office at [phone] if we can be of additional assistance.   Marri Mcneff Ihor Gully, NP

## 2022-07-15 ENCOUNTER — Encounter: Payer: Self-pay | Admitting: Internal Medicine

## 2022-07-15 ENCOUNTER — Ambulatory Visit (INDEPENDENT_AMBULATORY_CARE_PROVIDER_SITE_OTHER): Payer: Medicare Other | Admitting: Internal Medicine

## 2022-07-15 VITALS — BP 148/66 | HR 66 | Temp 98.4°F | Ht 64.0 in | Wt 145.9 lb

## 2022-07-15 DIAGNOSIS — K5904 Chronic idiopathic constipation: Secondary | ICD-10-CM | POA: Diagnosis not present

## 2022-07-15 DIAGNOSIS — K219 Gastro-esophageal reflux disease without esophagitis: Secondary | ICD-10-CM

## 2022-07-15 DIAGNOSIS — K743 Primary biliary cirrhosis: Secondary | ICD-10-CM

## 2022-07-15 DIAGNOSIS — R1011 Right upper quadrant pain: Secondary | ICD-10-CM | POA: Diagnosis not present

## 2022-07-15 NOTE — Progress Notes (Signed)
CC   Referring Provider: [name], DO Primary Care Physician:  [name], DO Primary GI:  Dr. Abbey Chatters  Chief Complaint  Patient presents with   Gastroesophageal Reflux    Patient arrives with daughter [name] for a follow up on GERD, dysphagia and cirrhosis. Concerned about having some pain on right  side. Tells her daughter that her liver hurts.     HPI:   [name] is a 88 y.o. female who presents to the clinic today for follow-up visit.  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  Most recent MRI 10/20/2021 showed smooth contour of the liver, postcholecystectomy changes.  3 pancreatic cyst with recommended repeat imaging in 2 years.  Also with history of chronic respiratory failure due to interstitial lung disease and enlarging pulmonary nodules.  Patient and her daughter have agreed to focus on quality of life at this time.  Currently enrolled in palliative care.  Today her main complaint is constipation.  States she takes Colace twice daily as well as Metamucil.  We will have bowel movements every few days.  Does note some straining involved.  No blood.  Also with occasional right upper quadrant pain.  This occurs once every few weeks.  Dull, achy.  Last for a few hours and then resolves.  Notes chronic GERD for which she takes pantoprazole twice daily.  This is well controlled as long as he takes her medication.  Past Medical History:  Diagnosis Date   Acute delirium 01/22/4535   Acute diastolic congestive heart failure ([place]) 03/21/2014   Anxiety    Cancer ([place])    Carotid artery disease (HCC)    Bilateral ICA occlusion   Chronic headaches    Chronic liver disease    ?cirrhosis on 10/2014 CT. H/O elevated alkphos and +AMA on URSO.   Coronary atherosclerosis of native coronary artery    Mild nonobstructive disease at cardiac catheterization May 2011   Depression    Essential hypertension, benign    Fracture of femoral neck, right, closed ([place])  03/28/2014   History of breast cancer    History of GI bleed    Hyperlipidemia    Orthostatic hypotension    Diabetic polyneuropathy/diabetic dysautonomia    Osteopenia    Parotid tumor    Pneumonia 11/2016   Pneumonia    Pressure ulcer, heel, right, unstageable ([place]) 04/17/2014   PSVT (paroxysmal supraventricular tachycardia) ([place])    Syncope and collapse 04/15/2010   Qualifier: Diagnosis of  By: Angelena Form, MD, Christopher     Type 2 diabetes mellitus ([place])    UPPER GASTROINTESTINAL HEMORRHAGE 03/21/2007   Qualifier: Diagnosis of  By: Jonna Munro MD, Roderic Scarce      Past Surgical History:  Procedure Laterality Date   ABDOMINAL HYSTERECTOMY     APPENDECTOMY     CATARACT EXTRACTION Bilateral    Dr. Gershon Crane   CHOLECYSTECTOMY  2008   COLONOSCOPY  2010   Dr. Oneida Alar: single tubular adenoma, one hyperplastic polyp. Next TCS 2020 health permitting.   ESOPHAGOGASTRODUODENOSCOPY N/A 08/17/2016   web in upper third of esophagus s/p dilation, five gastric ulcers without bleeding   ESOPHAGOGASTRODUODENOSCOPY N/A 01/01/2017    normal esophagus, gastritis   HEMORRHOID SURGERY  1960s   HIP ARTHROPLASTY Right 03/28/2014   Procedure: ARTHROPLASTY BIPOLAR HIP;  Surgeon: Carole Civil, MD;  Location: [facility];  Service: Orthopedics;  Laterality: Right;   LAPAROSCOPIC APPENDECTOMY N/A 04/13/2013   Procedure: APPENDECTOMY LAPAROSCOPIC;  Surgeon: Donato Heinz, MD;  Location: [facility]  ORS;  Service: General;  Laterality: N/A;   MASTECTOMY  1994   Left breast -related to breast cancer   RIGHT HEART CATH N/A 04/29/2017   Procedure: Right Heart Cath;  Surgeon: Adrian Prows, MD;  Location: [facility];  Service: Cardiovascular;  Laterality: N/A;   RIGHT/LEFT HEART CATH AND CORONARY ANGIOGRAPHY N/A 04/13/2017   Procedure: RIGHT/LEFT HEART CATH AND CORONARY ANGIOGRAPHY;  Surgeon: Adrian Prows, MD;  Location: [facility];  Service: Cardiovascular;  Laterality: N/A;   SAVORY DILATION N/A 08/17/2016   Procedure:  SAVORY DILATION;  Surgeon: Danie Binder, MD;  Location: [facility] SUITE;  Service: Endoscopy;  Laterality: N/A;   VESICOVAGINAL FISTULA CLOSURE W/ TAH  3474   YAG LASER APPLICATION Left    Dr. Zadie Rhine    Current Outpatient Medications  Medication Sig Dispense Refill   acetaminophen (TYLENOL) 500 MG tablet Take 500 mg by mouth every 8 (eight) hours as needed for mild pain or moderate pain.     ALPRAZolam (XANAX) 0.5 MG tablet Take 0.5 mg by mouth 2 (two) times daily as needed for anxiety.      atorvastatin (LIPITOR) 10 MG tablet Take 1 tablet (10 mg total) by mouth daily. 30 tablet 0   cholecalciferol (VITAMIN D) 1000 UNITS tablet Take 2,000 Units by mouth every morning.     citalopram (CELEXA) 20 MG tablet Take 1 tablet by mouth daily.     clopidogrel (PLAVIX) 75 MG tablet Take 75 mg by mouth daily.     docusate sodium (COLACE) 100 MG capsule Take 100 mg by mouth 2 (two) times daily.     feeding supplement, GLUCERNA SHAKE, (GLUCERNA SHAKE) LIQD Take 237 mLs by mouth daily as needed.      FREESTYLE LITE test strip 1 each 3 (three) times daily.     furosemide (LASIX) 40 MG tablet Take 1.5 tablets (60 mg total) by mouth daily. 135 tablet 3   hydrALAZINE (APRESOLINE) 10 MG tablet TAKE 1 TABLET IN THE MORNING AND AT BEDTIME 180 tablet 3   insulin aspart (NOVOLOG) 100 UNIT/ML FlexPen Inject 5 Units into the skin 3 (three) times daily with meals. When BG is over 100     insulin glargine (LANTUS) 100 UNIT/ML Solostar Pen Inject 8 Units into the skin at bedtime.      loratadine (CLARITIN) 10 MG tablet Take 10 mg by mouth daily.     macitentan (OPSUMIT) 10 MG tablet Take 1 tablet (10 mg total) by mouth daily. 90 tablet 3   metoprolol tartrate (LOPRESSOR) 25 MG tablet Take 12.5 mg by mouth 2 (two) times daily. 12.78m in the AM, 12.531min the PM     mirtazapine (REMERON) 30 MG tablet Take 30 mg by mouth at bedtime.     Multiple Vitamin (MULTIVITAMIN) tablet Take 1 tablet by mouth daily. Reported on  02/06/2016     olmesartan (BENICAR) 5 MG tablet TAKE 1 TABLET DAILY 90 tablet 3   pantoprazole (PROTONIX) 40 MG tablet TAKE 1 TABLET TWICE A DAY BEFORE MEALS (Patient taking differently: 40 mg daily.) 180 tablet 3   Polyethyl Glycol-Propyl Glycol (SYSTANE OP) Apply to eye as needed.     potassium chloride (KLOR-CON) 10 MEQ tablet TAKE 1 TABLET DAILY 90 tablet 3   psyllium (METAMUCIL SMOOTH TEXTURE) 28 % packet Take 1 packet by mouth as needed.      sodium chloride (OCEAN) 0.65 % SOLN nasal spray Place 1 spray into both nostrils as needed for congestion.  thyroid (ARMOUR) 15 MG tablet Take 15 mg by mouth daily.     umeclidinium bromide (INCRUSE ELLIPTA) 62.5 MCG/INH AEPB Inhale 1 puff into the lungs daily.     ursodiol (ACTIGALL) 500 MG tablet Take 1 tablet (500 mg total) by mouth 2 (two) times daily with a meal. 180 tablet 3   No current facility-administered medications for this visit.    Allergies as of 07/15/2022 - Review Complete 07/15/2022  Allergen Reaction Noted   Aricept [donepezil hcl]  02/16/2014   Namenda [memantine hcl]  02/16/2014   Adcirca [tadalafil] Other (See Comments) 04/20/2019   Donepezil Nausea Only 01/20/2016   Spironolactone Other (See Comments) 12/16/2015   Codeine Rash 02/16/2014   Indocin [indomethacin] Rash 09/11/2014   Latex Rash 02/16/2014   Penicillins Rash 01/11/2007    Family History  Problem Relation Age of Onset   Heart failure Mother    Kidney cancer Mother    Cancer Mother    Hypertension Mother    Lung cancer Father        Brain METS, smoked   Cancer Father    Kidney cancer Sister    Cancer Sister    Hypertension Sister    Cirrhosis Brother    Diabetes Son    Heart attack Neg Hx    Stroke Neg Hx     Social History   Socioeconomic History   Marital status: Divorced    Spouse name: Not on file   Number of children: 4   Years of education: Not on file   Highest education level: Not on file  Occupational History   Occupation:  RETIRED FACTORY WORKER    Comment: [employer]  Tobacco Use   Smoking status: Former    Packs/day: 0.50    Years: 40.00    Total pack years: 20.00    Types: Cigarettes    Quit date: 03/30/2013    Years since quitting: 9.2    Passive exposure: Past   Smokeless tobacco: Never  Vaping Use   Vaping Use: Never used  Substance and Sexual Activity   Alcohol use: No    Alcohol/week: 0.0 standard drinks of alcohol   Drug use: No   Sexual activity: Not Currently  Other Topics Concern   Not on file  Social History Narrative   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 Started age 67.   Drug use -no   Alcohol use - no   Lives with son   Education - 12 th grade                  Social Determinants of Health   Financial Resource Strain: Not on file  Food Insecurity: No Food Insecurity (01/30/2020)   Hunger Vital Sign    Worried About Running Out of Food in the Last Year: Never true    Ran Out of Food in the Last Year: Never true  Transportation Needs: No Transportation Needs (01/30/2020)   PRAPARE - Hydrologist (Medical): No    Lack of Transportation (Non-Medical): No  Physical Activity: Not on file  Stress: Not on file  Social Connections: Not on file    Subjective: Review of Systems  Constitutional:  Negative for chills and fever.  HENT:  Negative for congestion and hearing loss.   Eyes:  Negative for blurred vision and double vision.  Respiratory:  Negative for cough and shortness of breath.   Cardiovascular:  Negative for chest pain and palpitations.  Gastrointestinal:  Positive for abdominal pain, constipation and heartburn. Negative for blood in stool, diarrhea, melena and vomiting.  Genitourinary:  Negative for dysuria and urgency.  Musculoskeletal:  Negative for joint pain and myalgias.   Skin:  Negative for itching and rash.  Neurological:  Negative for dizziness and headaches.  Psychiatric/Behavioral:  Negative for depression. The patient is not nervous/anxious.      Objective: BP (!) 148/66 (BP Location: Left Arm, Patient Position: Sitting, Cuff Size: Normal)   Pulse 66   Temp 98.4 F (36.9 C) (Oral)   Ht _0  (1.626 m)   Wt 145 lb 14.4 oz (66.2 kg)   BMI 25.04 kg/m  Physical Exam Constitutional:      Appearance: Normal appearance.  HENT:     Head: Normocephalic and atraumatic.  Eyes:     Extraocular Movements: Extraocular movements intact.     Conjunctiva/sclera: Conjunctivae normal.  Cardiovascular:     Rate and Rhythm: Normal rate and regular rhythm.  Pulmonary:     Effort: Pulmonary effort is normal.     Breath sounds: Normal breath sounds.  Abdominal:     General: Bowel sounds are normal.     Palpations: Abdomen is soft.  Musculoskeletal:        General: No swelling. Normal range of motion.     Cervical back: Normal range of motion and neck supple.  Skin:    General: Skin is warm and dry.     Coloration: Skin is not jaundiced.  Neurological:     General: No focal deficit present.     Mental Status: She is alert and oriented to person, place, and time.  Psychiatric:        Mood and Affect: Mood normal.        Behavior: Behavior normal.     Assessment: *Primary biliary cholangitis *Right upper quadrant abdominal pain, mild intermittent *Chronic idiopathic constipation *Chronic GERD well-controlled on pantoprazole  Plan: For patient's constipation, I recommend she continue on Colace twice daily.  She can add on once daily Dulcolax to this as needed.  Recommend she switch from Metamucil to Benefiber and see if this helps.  Encouraged to drink at least 6 glasses of water daily.  Offered blood work and ultrasound in regards to her chronic liver disease.  They would like to hold off which I think is reasonable given her respiratory  failure and palliative care status.  Chronic GERD well-controlled pantoprazole.  We will continue.  May consider trial of Linzess if constipation not improved on over-the-counter remedies.  Follow-up in 3 to 4 months.  07/15/2022 1:48 PM   Disclaimer: This note was dictated with voice recognition software. Similar sounding words can inadvertently be transcribed and may not be corrected upon review.

## 2022-07-15 NOTE — Patient Instructions (Signed)
For your chronic constipation, continue on Colace twice daily.  I want you to add on once daily Dulcolax (bisacodyl) to this.  Would recommend switching from Metamucil to Benefiber.  Be sure to drink at least 4 to 6 glasses of water daily.  If this does not help, we will consider trialing you on a prescription medication called Linzess.  Continue on pantoprazole for your chronic reflux.  Follow-up in 3 months.  It was very nice meeting both you today.  Dr. Abbey Chatters

## 2022-07-22 DIAGNOSIS — I10 Essential (primary) hypertension: Secondary | ICD-10-CM | POA: Diagnosis not present

## 2022-08-04 ENCOUNTER — Telehealth: Payer: Medicare Other | Admitting: Nurse Practitioner

## 2022-08-04 ENCOUNTER — Encounter: Payer: Self-pay | Admitting: Nurse Practitioner

## 2022-08-04 DIAGNOSIS — I272 Pulmonary hypertension, unspecified: Secondary | ICD-10-CM

## 2022-08-04 DIAGNOSIS — Z515 Encounter for palliative care: Secondary | ICD-10-CM | POA: Diagnosis not present

## 2022-08-04 DIAGNOSIS — J449 Chronic obstructive pulmonary disease, unspecified: Secondary | ICD-10-CM | POA: Diagnosis not present

## 2022-08-04 DIAGNOSIS — R0602 Shortness of breath: Secondary | ICD-10-CM | POA: Diagnosis not present

## 2022-08-04 NOTE — Progress Notes (Signed)
[facility] Consult Note Telephone: [phone]  Fax: [phone]    Date of encounter: 08/04/22 4:15 PM PATIENT NAME: [name] [address] [place] [address]   [phone] (home)  DOB: [birth year] MRN: [id number] PRIMARY CARE PROVIDER:    [name], Chambers,  [address] [place] [address] [phone]  RESPONSIBLE PARTY:    Contact Information     Name Relation Home Work [place] Daughter [phone]  [phone]   [name] [phone]  [phone]   [name] Daughter [phone]  [phone]         Due to the COVID-19 crisis, this visit was done via telemedicine from my office and it was initiated and consent by this patient and or family.   I connected with 3 daughter, son with  [name] OR [facility]OX[facility] on 05/05/22 by a video enabled telemedicine application and verified that I am speaking with the correct person using two identifiers.   I discussed the limitations of evaluation and management by telemedicine. The patient expressed understanding and agreed to proceed.  Palliative Care was asked to follow this patient by consultation request of  [name] to address advance care planning and complex medical decision making. This is the initial visit.                        ASSESSMENT AND PLAN / RECOMMENDATIONS:  Symptom Management/Plan: 1. Advance Care Planning;  DNR; refer to proceed with Hospice evaluation 2. Goals of Care: Goals include to maximize quality of life and symptom management. Our advance care planning conversation included a discussion about:    The value and importance of advance care planning  Exploration of personal, cultural or spiritual beliefs that might influence medical decisions  Exploration of goals of care in the event of a sudden injury or illness  Identification and preparation of a healthcare agent  Review and updating or  creation of an advance directive document. 3. Shortness of breath secondary to COPD, pulmonary fibrosis, stable; continue with inhalation therapy as needed, supplemental O2, continue to monitor respiratory status   4. Palliative care encounter; Palliative care encounter; Palliative medicine team will continue to support patient, patient's family, and medical team. Visit consisted of counseling and education dealing with the complex and emotionally intense issues of symptom management and palliative care in the setting of serious and potentially life-threatening illness Follow up Palliative Care Visit: Palliative care will continue to follow for complex medical decision making, advance care planning, and clarification of goals. Return 4 weeks or prn.   I spent 31 minutes providing this consultation. More than 50% of the time in this consultation was spent in counseling and care coordination. PPS: 50% Chief Complaint: Follow up palliative consult for complex medical decision making, address goals, manage ongoing symptoms   HISTORY OF PRESENT ILLNESS:  [name] is a 88 y.o. year old female  with multiple medical problems including pulmonary fibrosis, COPD, emphysema, HTN, CAD, DM, HLD, h/o GI bleed, +ANA, h/o breast cancer (1984) s/p mastectomy with chemo, NO- XRT, Multiple pulmonary nodules, stable since 09/2017, Cirrhosis by imaging, with no history of ascites, no varices on EGD, no splenomegaly on imaging, normal synthetic dysfunction. Quit smoking in 2014;  smoked half a pack to a pack per day for 40 years. I connected by video for telemedicine f/u pc visit. We talked about how [name] has been feeling, ros,  symptoms, shortness of breath and pain currently being managed. [name] went today to have her hair done. We talked about functional abilities continues to Chambers some cooking, ambulating without falls, getting dressed, bathing independently. We talked about appetite, weights. [name]  continues to try to be independent as able. We talked about daily routine, quality of life. We talked about overall [name] has been stable. We talked medical goals, quality of life. We talked about f/u pc visit, supportive with questions answered, scheduled. Contact information provided   History obtained from review of EMR, discussion with [name] 3 daughters [name] [name] by video and son I reviewed available labs, medications, imaging, studies and related documents from the EMR.  Records reviewed and summarized above.    ROS 10 point system reviewed all negative except HPI   Physical Exam: deferred  Thank you for the opportunity to participate in the care of [name].  The palliative care team will continue to follow. Please call our office at [phone] if we can be of additional assistance.   Aydin Hink Ihor Gully, NP

## 2022-08-21 DIAGNOSIS — I10 Essential (primary) hypertension: Secondary | ICD-10-CM | POA: Diagnosis not present

## 2022-09-07 DIAGNOSIS — B351 Tinea unguium: Secondary | ICD-10-CM | POA: Diagnosis not present

## 2022-09-07 DIAGNOSIS — M79674 Pain in right toe(s): Secondary | ICD-10-CM | POA: Diagnosis not present

## 2022-09-07 DIAGNOSIS — M79675 Pain in left toe(s): Secondary | ICD-10-CM | POA: Diagnosis not present

## 2022-09-07 DIAGNOSIS — E1151 Type 2 diabetes mellitus with diabetic peripheral angiopathy without gangrene: Secondary | ICD-10-CM | POA: Diagnosis not present

## 2022-09-09 DIAGNOSIS — E039 Hypothyroidism, unspecified: Secondary | ICD-10-CM | POA: Diagnosis not present

## 2022-09-09 DIAGNOSIS — E1159 Type 2 diabetes mellitus with other circulatory complications: Secondary | ICD-10-CM | POA: Diagnosis not present

## 2022-09-09 DIAGNOSIS — I5030 Unspecified diastolic (congestive) heart failure: Secondary | ICD-10-CM | POA: Diagnosis not present

## 2022-09-09 DIAGNOSIS — I10 Essential (primary) hypertension: Secondary | ICD-10-CM | POA: Diagnosis not present

## 2022-09-14 ENCOUNTER — Telehealth: Payer: Self-pay

## 2022-09-14 NOTE — Telephone Encounter (Signed)
423 pm.  Daughter [name] called in over the weekend to report patient's abdomen was firm, distended and painful.  Daughter was advised to contact PCP or have pt evaluated in the ED.  Follow up call made to daughter to check on patient status.  No answer. Message left.

## 2022-09-18 ENCOUNTER — Ambulatory Visit: Payer: Medicare Other | Admitting: Cardiology

## 2022-09-21 DIAGNOSIS — I10 Essential (primary) hypertension: Secondary | ICD-10-CM | POA: Diagnosis not present

## 2022-09-24 ENCOUNTER — Telehealth: Payer: Self-pay | Admitting: Internal Medicine

## 2022-09-24 ENCOUNTER — Telehealth: Payer: Medicare Other | Admitting: Nurse Practitioner

## 2022-09-24 ENCOUNTER — Encounter: Payer: Self-pay | Admitting: Internal Medicine

## 2022-09-24 DIAGNOSIS — R0602 Shortness of breath: Secondary | ICD-10-CM | POA: Diagnosis not present

## 2022-09-24 DIAGNOSIS — Z515 Encounter for palliative care: Secondary | ICD-10-CM

## 2022-09-24 DIAGNOSIS — J449 Chronic obstructive pulmonary disease, unspecified: Secondary | ICD-10-CM

## 2022-09-24 DIAGNOSIS — I272 Pulmonary hypertension, unspecified: Secondary | ICD-10-CM | POA: Diagnosis not present

## 2022-09-24 NOTE — Telephone Encounter (Signed)
Recall sent

## 2022-09-24 NOTE — Telephone Encounter (Signed)
(  Recall) -MRI  to be scheduled

## 2022-09-25 ENCOUNTER — Encounter: Payer: Self-pay | Admitting: Nurse Practitioner

## 2022-09-25 NOTE — Progress Notes (Signed)
[facility] Consult Note Telephone: [phone]  Fax: [phone]    Date of encounter: 09/25/22 3:15 PM PATIENT NAME: MALISSIA RABBANI 88 [address] [place] [address]   [phone] (home)  DOB: 07/05/1931 MRN: [id number] PRIMARY CARE PROVIDER:    [name], DO,  [address] [place] [address] [phone]  RESPONSIBLE PARTY:    Contact Information     Name Relation Home Work [place] Daughter [phone]  [phone]   [place], [place] [phone]  [phone]   [place] Daughter [phone]  [phone]      Due to the COVID-19 crisis, this visit was done via telemedicine from my office and it was initiated and consent by this patient and or family.  I connected with Daughters [name], son Nicole Kindred with  [name] OR PROXY on 09/25/22 by a video enabled telemedicine application and verified that I am speaking with the correct person using two identifiers.   I discussed the limitations of evaluation and management by telemedicine. The patient expressed understanding and agreed to proceed. Palliative Care was asked to follow this patient by consultation request of  [name], DO to address advance care planning and complex medical decision making. This is a follow up visit.                                  ASSESSMENT AND PLAN / RECOMMENDATIONS:  1. Advance Care Planning;  DNR; previously referred to hospice though with av visit Ms. Maxham/family decided they were not ready for hospice. Focus on minimize suffering though treating what is treatable.   Transition within [facility] program with PC NP to sign off and continue with PC RN/SW to follow.   2. Goals of Care: Goals include to maximize quality of life and symptom management. Our advance care planning conversation included a discussion about:    The value and importance of advance care planning  Exploration of personal,  cultural or spiritual beliefs that might influence medical decisions  Exploration of goals of care in the event of a sudden injury or illness  Identification and preparation of a healthcare agent  Review and updating or creation of an advance directive document. 3. Shortness of breath secondary to COPD, pulmonary fibrosis, stable; continue with inhalation therapy as needed, supplemental O2, continue to monitor respiratory status   4. Palliative care encounter; Palliative care encounter; Palliative medicine team will continue to support patient, patient's family, and medical team. Visit consisted of counseling and education dealing with the complex and emotionally intense issues of symptom management and palliative care in the setting of serious and potentially life-threatening illness I spent 31 minutes providing this consultation. More than 50% of the time in this consultation was spent in counseling and care coordination. PPS: 50% Chief Complaint: Follow up palliative consult for complex medical decision making, address goals, manage ongoing symptoms  HISTORY OF PRESENT ILLNESS:  TATISHA CERINO is a 88 y.o. year old female  with with multiple medical problems including pulmonary fibrosis, COPD, emphysema, HTN, CAD, DM, HLD, h/o GI bleed, +ANA, h/o breast cancer (1984) s/p mastectomy with chemo, NO- XRT, Multiple pulmonary nodules, stable since 09/2017, Cirrhosis by imaging, with no history of ascites, no varices on EGD, no splenomegaly on imaging, normal synthetic dysfunction. Quit smoking in 2014;  smoked half a pack to a pack per day for 40 years. I connected  by video for telemedicine f/u pc visit with Ms Mccaughey, daughters [name] with son Nicole Kindred. We talked about how Ms. Winch has been feeling, ros, symptoms, shortness of breath and pain currently being managed. We talked about functional abilities, continues to wash the dishes, does things for herself, adl's. We talked about Ms. Piontek continues to  require O2. She does become sob with exertion though improves with rest. We talked about appetite which is good, does not appear to have lost weight. We talked about medical goals. We talked about speciality appointments. We talked about in home primary provider, numbers given for Alvester Chou NP [facility]; [facility] ([facility] Contact information provided. We talked about transition within [facility] program with PC NP to sign off and continue with PC RN/SW to follow. Ms. Kulick verbalized understanding. We talked about disease progression. Therapeutic listening, emotional support provided. Questions answered.   History obtained from review of EMR, discussion with Daughters, [name], son-Tony with Ms. Dado.  I reviewed available labs, medications, imaging, studies and related documents from the EMR.  Records reviewed and summarized above.   ROS 10 point system reviewed all negative except HPI  Physical Exam: deferred Thank you for the opportunity to participate in the care of Ms. Mcadam. Please call our office at [phone] if we can be of additional assistance.   Ismeal Heider Ihor Gully, NP

## 2022-09-29 ENCOUNTER — Other Ambulatory Visit: Payer: Self-pay | Admitting: *Deleted

## 2022-09-29 DIAGNOSIS — K7469 Other cirrhosis of liver: Secondary | ICD-10-CM

## 2022-09-29 DIAGNOSIS — R772 Abnormality of alphafetoprotein: Secondary | ICD-10-CM

## 2022-09-30 ENCOUNTER — Telehealth: Payer: Self-pay | Admitting: *Deleted

## 2022-09-30 ENCOUNTER — Ambulatory Visit: Payer: Medicare Other | Admitting: Cardiology

## 2022-09-30 NOTE — Telephone Encounter (Signed)
Pt's daughter [name] informed of MRI scheduled with date and time. Verbalized understanding.

## 2022-09-30 NOTE — Telephone Encounter (Signed)
MRI scheduled at [facility] for 10/21/22 at 3 pm, arrive at 2:45 pm, nothing to eat or drink 4 hours prior to procedure.  [place]

## 2022-10-02 ENCOUNTER — Telehealth: Payer: Self-pay

## 2022-10-02 ENCOUNTER — Ambulatory Visit: Payer: Medicare Other | Admitting: Cardiology

## 2022-10-02 NOTE — Telephone Encounter (Signed)
PC SW connected via telephone with patient's daughters Clydie Braun and Dois Davenport.  New PC program discussed. Daughters chose to dis enroll in [facility] program and will follow up with PCP about [facility]'s critical illness (palliative) program.  Patient will be discharged from [facility] care Palliative care program.

## 2022-10-02 NOTE — Telephone Encounter (Addendum)
PC SW outreached patient/patient daughter, [name], to cancel [facility] NP upcoming mychart visit on Tue 11/21 and reschedule with [facility] RN/SW team for later date, per new PC program guidelines.   Call unsuccessful. SW LVM. Awaiting return call.

## 2022-10-06 ENCOUNTER — Telehealth: Payer: Medicare Other | Admitting: Nurse Practitioner

## 2022-10-21 ENCOUNTER — Ambulatory Visit (HOSPITAL_COMMUNITY)
Admission: RE | Admit: 2022-10-21 | Discharge: 2022-10-21 | Disposition: A | Discharge status: Home / Self Care | Payer: Medicare Other | Source: Ambulatory Visit | Attending: Gastroenterology | Admitting: Gastroenterology

## 2022-10-21 ENCOUNTER — Other Ambulatory Visit: Payer: Self-pay | Admitting: Gastroenterology

## 2022-10-21 DIAGNOSIS — K862 Cyst of pancreas: Secondary | ICD-10-CM | POA: Diagnosis not present

## 2022-10-21 DIAGNOSIS — K7469 Other cirrhosis of liver: Secondary | ICD-10-CM

## 2022-10-21 DIAGNOSIS — K7460 Unspecified cirrhosis of liver: Secondary | ICD-10-CM | POA: Diagnosis not present

## 2022-10-21 DIAGNOSIS — R772 Abnormality of alphafetoprotein: Secondary | ICD-10-CM

## 2022-10-21 DIAGNOSIS — I10 Essential (primary) hypertension: Secondary | ICD-10-CM | POA: Diagnosis not present

## 2022-10-21 DIAGNOSIS — R935 Abnormal findings on diagnostic imaging of other abdominal regions, including retroperitoneum: Secondary | ICD-10-CM | POA: Diagnosis not present

## 2022-10-21 DIAGNOSIS — E278 Other specified disorders of adrenal gland: Secondary | ICD-10-CM | POA: Diagnosis not present

## 2022-10-21 DIAGNOSIS — N289 Disorder of kidney and ureter, unspecified: Secondary | ICD-10-CM | POA: Diagnosis not present

## 2022-10-21 MED ORDER — GADOBUTROL 1 MMOL/ML IV SOLN
6.0000 mL | Freq: Once | INTRAVENOUS | Status: AC | PRN
  Administered 2022-10-21: 6 mL via INTRAVENOUS

## 2022-10-22 ENCOUNTER — Ambulatory Visit: Payer: Medicare Other | Admitting: Cardiology

## 2022-10-22 ENCOUNTER — Telehealth: Payer: Self-pay

## 2022-10-22 ENCOUNTER — Encounter: Payer: Self-pay | Admitting: Cardiology

## 2022-10-22 VITALS — BP 140/65 | HR 61 | Resp 16 | Ht 64.0 in | Wt 151.0 lb

## 2022-10-22 DIAGNOSIS — I10 Essential (primary) hypertension: Secondary | ICD-10-CM

## 2022-10-22 DIAGNOSIS — I5032 Chronic diastolic (congestive) heart failure: Secondary | ICD-10-CM | POA: Diagnosis not present

## 2022-10-22 DIAGNOSIS — Z66 Do not resuscitate: Secondary | ICD-10-CM | POA: Diagnosis not present

## 2022-10-22 DIAGNOSIS — I270 Primary pulmonary hypertension: Secondary | ICD-10-CM

## 2022-10-22 NOTE — Telephone Encounter (Signed)
Patient's home BP is controlled on current antihypertensive regimen.   Average Systolic BP Level 045.91 mmHg Lowest Systolic BP Level 368 mmHg Highest Systolic BP Level 599 mmHg  Average Diastolic BP Level 23.41 mmHg Lowest Diastolic BP Level 44 mmHg Highest Diastolic BP Level 70 mmHg  Average Pulse Level 60.24 BPM Lowest Pulse Level 52 BPM Highest Pulse Level 70 BPM  10/22/2022 Thursday at 08:12 AM 145 / 65      10/21/2022 Wednesday at 09:42 PM 123 / 56      10/21/2022 Wednesday at 07:44 AM 114 / 53      10/20/2022 Tuesday at 08:46 PM 139 / 64      10/20/2022 Tuesday at 08:53 AM 120 / 50      10/19/2022 Monday at 09:20 PM 111 / 44      10/19/2022 Monday at 09:24 AM 125 / 61      10/18/2022 _0 /01/2022 Sunday at 09:00 AM 119 / 51

## 2022-10-22 NOTE — Progress Notes (Signed)
Primary Physician/Referring:  [name], DO  Patient ID: [name], female    DOB: 08-Aug-1931, 88 y.o.   MRN: [id number]  Chief Complaint  Patient presents with   Primary pulmonary hypertension   Diastolic CHF, chronic   DOE   Carotid artery stenosis   Follow-up    6 months   HPI: [name]  is a 88 y.o. female  AAF patient with primary pulmonary hypertension,  cryptogenic cirrhosis of the liver, presently on Opsumit since July 2018, did not tolerate Adcirca or Sildanafil due to visual side effects. She is also being followed at [facility] by Dr. Suzi Roots, last seen in July 2021 in November 2018 recommended continuing present medical therapy in view of advanced age and other underlying comorbidity difficult to delineate 6-minute walk test was appropriate.  She has history of breast cancer status post radical mastectomy and chemotherapy, well controlled diabetes mellitus, moderate coronary artery disease and bilateral carotid artery occlusion without stroke and is on chronic plavix.  No recent hospital visits or ED visits. Denies leg edema, denies worsening dyspnea, no chest pain or palpitations.  This is a 99-monthoffice visit.  Review of Systems  Cardiovascular:  Positive for dyspnea on exertion (stable). Negative for chest pain and leg swelling.  Musculoskeletal:  Positive for joint pain.  Gastrointestinal:  Negative for melena.   Objective  Blood pressure (!) 155/65, pulse 61, resp. rate 16, height _0  (1.626 m), weight 151 lb (68.5 kg), SpO2 91 %. Body mass index is 25.92 kg/m.    10/22/2022    2:43 PM 10/22/2022    2:34 PM 07/15/2022    1:31 PM  Vitals with BMI  Height  _1  _2   Weight  151 lbs 145 lbs 14 oz  BMI  276.81215.72 Systolic [id number] Diastolic 65 71 66  Pulse 61 82 66   . Physical Exam Constitutional:      General: She is not in acute distress.    Appearance: She is well-developed.  Neck:     Vascular: Carotid bruit (right more  prominant) present. No JVD.  Cardiovascular:     Rate and Rhythm: Normal rate and regular rhythm.     Pulses: Normal pulses and intact distal pulses.     Heart sounds: Murmur heard.     Blowing midsystolic murmur is present with a grade of 2/6 at the lower left sternal border.     No gallop.  Pulmonary:     Effort: Pulmonary effort is normal.     Breath sounds: Examination of the right-middle field reveals rales. Examination of the right-lower field reveals rhonchi and rales. Examination of the left-lower field reveals rales. Rhonchi and rales present.  Abdominal:     General: Bowel sounds are normal.     Palpations: Abdomen is soft.  Musculoskeletal:     Cervical back: Neck supple.     Right lower leg: No edema.     Left lower leg: No edema.  Skin:    Capillary Refill: Capillary refill takes less than 2 seconds.    Radiology: No results found.  Laboratory examination:    External labs:  Labs 09/09/2022:  Hb 11.7/HCT 34.6, platelets 159.  Serum glucose 99 mg, BUN 29, creatinine 1.20, EGFR 43 mL, potassium 4.5.  LFTs normal.  TSH normal at 3.5, free T4 normal at 1.2.  A1c 7.1%.    Labs 03/09/2022:  A1c 7.1%.  Total cholesterol 185, triglycerides 104,  HDL 71, LDL 93.  Non-HDL cholesterol 114.   Medications after present encounter  Current Outpatient Medications:    acetaminophen (TYLENOL) 500 MG tablet, Take 500 mg by mouth every 8 (eight) hours as needed for mild pain or moderate pain., Disp: , Rfl:    ALPRAZolam (XANAX) 0.5 MG tablet, Take 0.5 mg by mouth 2 (two) times daily as needed for anxiety. , Disp: , Rfl:    atorvastatin (LIPITOR) 10 MG tablet, Take 1 tablet (10 mg total) by mouth daily., Disp: 30 tablet, Rfl: 0   cholecalciferol (VITAMIN D) 1000 UNITS tablet, Take 2,000 Units by mouth every morning., Disp: , Rfl:    citalopram (CELEXA) 20 MG tablet, Take 1 tablet by mouth daily., Disp: , Rfl:    clopidogrel (PLAVIX) 75 MG tablet, Take 75 mg by mouth daily.,  Disp: , Rfl:    docusate sodium (COLACE) 100 MG capsule, Take 100 mg by mouth 2 (two) times daily., Disp: , Rfl:    feeding supplement, GLUCERNA SHAKE, (GLUCERNA SHAKE) LIQD, Take 237 mLs by mouth daily as needed. , Disp: , Rfl:    FREESTYLE LITE test strip, 1 each 3 (three) times daily., Disp: , Rfl:    furosemide (LASIX) 40 MG tablet, Take 1.5 tablets (60 mg total) by mouth daily., Disp: 135 tablet, Rfl: 3   hydrALAZINE (APRESOLINE) 10 MG tablet, TAKE 1 TABLET IN THE MORNING AND AT BEDTIME, Disp: 180 tablet, Rfl: 3   insulin aspart (NOVOLOG) 100 UNIT/ML FlexPen, Inject 5 Units into the skin 3 (three) times daily with meals. When BG is over 100, Disp: , Rfl:    insulin glargine (LANTUS) 100 UNIT/ML Solostar Pen, Inject 8 Units into the skin at bedtime. , Disp: , Rfl:    loratadine (CLARITIN) 10 MG tablet, Take 10 mg by mouth daily., Disp: , Rfl:    macitentan (OPSUMIT) 10 MG tablet, Take 1 tablet (10 mg total) by mouth daily., Disp: 90 tablet, Rfl: 3   metoprolol tartrate (LOPRESSOR) 25 MG tablet, Take 12.5 mg by mouth 2 (two) times daily. 12.57m in the AM, 12.567min the PM, Disp: , Rfl:    mirtazapine (REMERON) 30 MG tablet, Take 30 mg by mouth at bedtime., Disp: , Rfl:    Multiple Vitamin (MULTIVITAMIN) tablet, Take 1 tablet by mouth daily. Reported on 02/06/2016, Disp: , Rfl:    olmesartan (BENICAR) 5 MG tablet, TAKE 1 TABLET DAILY, Disp: 90 tablet, Rfl: 3   pantoprazole (PROTONIX) 40 MG tablet, TAKE 1 TABLET TWICE A DAY BEFORE MEALS (Patient taking differently: 40 mg daily.), Disp: 180 tablet, Rfl: 3   Polyethyl Glycol-Propyl Glycol (SYSTANE OP), Apply to eye as needed., Disp: , Rfl:    potassium chloride (KLOR-CON) 10 MEQ tablet, TAKE 1 TABLET DAILY, Disp: 90 tablet, Rfl: 3   psyllium (METAMUCIL SMOOTH TEXTURE) 28 % packet, Take 1 packet by mouth as needed. , Disp: , Rfl:    sodium chloride (OCEAN) 0.65 % SOLN nasal spray, Place 1 spray into both nostrils as needed for congestion., Disp: ,  Rfl:    thyroid (ARMOUR) 15 MG tablet, Take 15 mg by mouth daily., Disp: , Rfl:    umeclidinium bromide (INCRUSE ELLIPTA) 62.5 MCG/INH AEPB, Inhale 1 puff into the lungs daily., Disp: , Rfl:    ursodiol (ACTIGALL) 500 MG tablet, Take 1 tablet (500 mg total) by mouth 2 (two) times daily with a meal., Disp: 180 tablet, Rfl: 3    Cardiac Studies:    Right Heart Cath 04/29/2017,  RA pressure 8/2, mean 2 mmHg. RV 78/1, EDP 3 mmHg. PA 80/18, mean 42 mmHg. PW 13/14, mean 11 mmHg. PA saturation was 73%, aortic saturation 98%. Cardiac output 4.88, cardiac index 2.85 by Fick.  Carotid artery duplex 08/27/2021:  Duplex suggests stenosis in the right internal carotid artery (total occlusion). Duplex suggests stenosis in the left internal carotid artery (50-69%), upper limit of spectrum. The left PSV internal/common carotid artery ratio of 5.35 is consistent with a stenosis of >70%. Duplex suggests stenosis in the left external carotid artery (<50%). Right vertebral artery flow is not visualized. Antegrade left vertebral artery flow. Comparison from 03/10/2017 not made due to poor quality study.  Previously reported bilateral ICA occlusion. Follow up in 6 months if clinically indicated.  PCV ECHOCARDIOGRAM COMPLETE 08/27/2021  Narrative Echocardiogram 08/27/2021: Left ventricle cavity is normal in size. Mild concentric hypertrophy of the left ventricle. Normal global wall motion. Normal LV systolic function with visual EF 50-55%. Doppler evidence of grade II (pseudonormal) diastolic dysfunction, elevated LAP. Left atrial cavity is moderately dilated. Mildly restricted mitral valve leaflets. Mild (Grade I) mitral regurgitation. Moderate tricuspid regurgitation. Estimated pulmonary artery systolic pressure 45 mmHg. Mild pulmonic regurgitation. Previous study on 01/03/2020 showed mod RA dilatation, estimated PASP 54 mmHg.    EKG:  EKG 10/22/2022: Sinus rhythm with first-degree AV block at rate of 74 bpm,  biatrial enlargement, normal axis.  No evidence of ischemia, occasional PACs.  Compared to 03/18/2022, no change.  Assessment     ICD-10-CM   1. Primary pulmonary hypertension (HCC)  I27.0 EKG 12-Lead    2. Diastolic CHF, chronic (HCC)  I50.32     3. Essential hypertension  I10     4. DNR (do not resuscitate)  Z66        No orders of the defined types were placed in this encounter.  There are no discontinued medications.   Orders Placed This Encounter  Procedures   EKG 12-Lead      Recommendations:   [name]  is a 88 y.o. AAF patient with primary pulmonary hypertension,  cryptogenic cirrhosis of the liver, presently on Opsumit since July 2018, did not tolerate Adcirca or Sildanafil due to visual side effects. She is also being followed at [facility] by Dr. Suzi Roots, last seen in July 2021 in November 2018 recommended continuing present medical therapy in view of advanced age and other underlying comorbidity difficult to delineate 6-minute walk test was appropriate.  She has history of breast cancer status post radical mastectomy and chemotherapy, well controlled diabetes mellitus, moderate coronary artery disease and bilateral carotid artery occlusion without stroke and is on chronic plavix.   1. Primary pulmonary hypertension ([place]) She remains very well compensated without clinical evidence of right-sided heart failure.  There is no JVD, no leg edema.  2. Diastolic CHF, chronic (HCC) She has not had any recent hospitalization, dyspnea has remained stable, no changes in the medications were done today.  3. Essential hypertension Blood pressure is under excellent control, she is presently enrolled in RPM both for heart failure and primary hypertension, average blood pressure systolic has been 184 mmHg.  4. DNR (do not resuscitate) Patient with advanced age, significant medical comorbidity in spite of this has been doing well.  She is presently 88 years of age.  Does  not want to be intubated and does not want CPR and this has been documented.  Overall I am so pleased with her feeling well and remaining without decompensated heart  failure or hospitalization, continue medical therapy for her primary hypertension and also pulmonary hypertension and I will see her back in 6 months.  No need for echocardiogram or 6-minute walk test given her advanced age and comorbidity and we are treating her essentially for symptoms.  External labs reviewed.   RPM Patient: November 2023 BP Data Average Systolic BP Level 169.45 mmHg Lowest Systolic BP Level 038 mmHg Highest Systolic BP Level 882 mmHg    Adrian Prows, MD, [facility] 10/22/2022, 3:11 PM Office: [phone]

## 2022-10-29 DIAGNOSIS — I5032 Chronic diastolic (congestive) heart failure: Secondary | ICD-10-CM | POA: Diagnosis not present

## 2022-10-29 DIAGNOSIS — Z515 Encounter for palliative care: Secondary | ICD-10-CM | POA: Diagnosis not present

## 2022-10-29 DIAGNOSIS — I272 Pulmonary hypertension, unspecified: Secondary | ICD-10-CM | POA: Diagnosis not present

## 2022-11-12 DIAGNOSIS — Z515 Encounter for palliative care: Secondary | ICD-10-CM | POA: Diagnosis not present

## 2022-11-12 DIAGNOSIS — I5032 Chronic diastolic (congestive) heart failure: Secondary | ICD-10-CM | POA: Diagnosis not present

## 2022-11-12 DIAGNOSIS — I272 Pulmonary hypertension, unspecified: Secondary | ICD-10-CM | POA: Diagnosis not present

## 2022-11-21 DIAGNOSIS — I10 Essential (primary) hypertension: Secondary | ICD-10-CM | POA: Diagnosis not present

## 2022-11-23 DIAGNOSIS — I5032 Chronic diastolic (congestive) heart failure: Secondary | ICD-10-CM | POA: Diagnosis not present

## 2022-11-23 DIAGNOSIS — E7800 Pure hypercholesterolemia, unspecified: Secondary | ICD-10-CM | POA: Diagnosis not present

## 2022-11-23 DIAGNOSIS — I270 Primary pulmonary hypertension: Secondary | ICD-10-CM | POA: Diagnosis not present

## 2022-11-23 DIAGNOSIS — J449 Chronic obstructive pulmonary disease, unspecified: Secondary | ICD-10-CM | POA: Diagnosis not present

## 2022-11-23 DIAGNOSIS — Z0000 Encounter for general adult medical examination without abnormal findings: Secondary | ICD-10-CM | POA: Diagnosis not present

## 2022-11-23 DIAGNOSIS — E039 Hypothyroidism, unspecified: Secondary | ICD-10-CM | POA: Diagnosis not present

## 2022-11-23 DIAGNOSIS — Z9981 Dependence on supplemental oxygen: Secondary | ICD-10-CM | POA: Diagnosis not present

## 2022-11-23 DIAGNOSIS — I6529 Occlusion and stenosis of unspecified carotid artery: Secondary | ICD-10-CM | POA: Diagnosis not present

## 2022-11-23 DIAGNOSIS — Z853 Personal history of malignant neoplasm of breast: Secondary | ICD-10-CM | POA: Diagnosis not present

## 2022-11-23 DIAGNOSIS — E1159 Type 2 diabetes mellitus with other circulatory complications: Secondary | ICD-10-CM | POA: Diagnosis not present

## 2022-12-07 ENCOUNTER — Ambulatory Visit (HOSPITAL_COMMUNITY): Payer: Medicare Other | Attending: Urology

## 2022-12-21 ENCOUNTER — Other Ambulatory Visit: Payer: Self-pay | Admitting: Cardiology

## 2022-12-21 DIAGNOSIS — M79674 Pain in right toe(s): Secondary | ICD-10-CM | POA: Diagnosis not present

## 2022-12-21 DIAGNOSIS — M79675 Pain in left toe(s): Secondary | ICD-10-CM | POA: Diagnosis not present

## 2022-12-21 DIAGNOSIS — I10 Essential (primary) hypertension: Secondary | ICD-10-CM

## 2022-12-21 DIAGNOSIS — B351 Tinea unguium: Secondary | ICD-10-CM | POA: Diagnosis not present

## 2022-12-21 DIAGNOSIS — E1151 Type 2 diabetes mellitus with diabetic peripheral angiopathy without gangrene: Secondary | ICD-10-CM | POA: Diagnosis not present

## 2022-12-22 DIAGNOSIS — I10 Essential (primary) hypertension: Secondary | ICD-10-CM | POA: Diagnosis not present

## 2022-12-23 ENCOUNTER — Ambulatory Visit: Payer: Medicare Other | Admitting: Urology

## 2022-12-29 ENCOUNTER — Ambulatory Visit: Payer: Medicare Other | Admitting: Urology

## 2023-01-06 DIAGNOSIS — I272 Pulmonary hypertension, unspecified: Secondary | ICD-10-CM | POA: Diagnosis not present

## 2023-01-06 DIAGNOSIS — I5032 Chronic diastolic (congestive) heart failure: Secondary | ICD-10-CM | POA: Diagnosis not present

## 2023-01-06 DIAGNOSIS — Z515 Encounter for palliative care: Secondary | ICD-10-CM | POA: Diagnosis not present

## 2023-01-07 ENCOUNTER — Other Ambulatory Visit: Payer: Self-pay | Admitting: Gastroenterology

## 2023-01-07 DIAGNOSIS — K743 Primary biliary cirrhosis: Secondary | ICD-10-CM

## 2023-01-21 DIAGNOSIS — I10 Essential (primary) hypertension: Secondary | ICD-10-CM | POA: Diagnosis not present

## 2023-01-22 ENCOUNTER — Other Ambulatory Visit: Payer: Self-pay

## 2023-01-22 DIAGNOSIS — I270 Primary pulmonary hypertension: Secondary | ICD-10-CM

## 2023-01-22 MED ORDER — MACITENTAN 10 MG PO TABS: 10.0000 mg | ORAL_TABLET | Freq: Every day | ORAL | 3 refills | Status: DC

## 2023-02-19 DIAGNOSIS — Z515 Encounter for palliative care: Secondary | ICD-10-CM | POA: Diagnosis not present

## 2023-02-19 DIAGNOSIS — I272 Pulmonary hypertension, unspecified: Secondary | ICD-10-CM | POA: Diagnosis not present

## 2023-02-19 DIAGNOSIS — I5032 Chronic diastolic (congestive) heart failure: Secondary | ICD-10-CM | POA: Diagnosis not present

## 2023-02-20 DIAGNOSIS — I10 Essential (primary) hypertension: Secondary | ICD-10-CM | POA: Diagnosis not present

## 2023-02-24 DIAGNOSIS — I6529 Occlusion and stenosis of unspecified carotid artery: Secondary | ICD-10-CM | POA: Diagnosis not present

## 2023-02-24 DIAGNOSIS — E7800 Pure hypercholesterolemia, unspecified: Secondary | ICD-10-CM | POA: Diagnosis not present

## 2023-02-24 DIAGNOSIS — J449 Chronic obstructive pulmonary disease, unspecified: Secondary | ICD-10-CM | POA: Diagnosis not present

## 2023-02-24 DIAGNOSIS — I270 Primary pulmonary hypertension: Secondary | ICD-10-CM | POA: Diagnosis not present

## 2023-02-24 DIAGNOSIS — E039 Hypothyroidism, unspecified: Secondary | ICD-10-CM | POA: Diagnosis not present

## 2023-02-24 DIAGNOSIS — I5032 Chronic diastolic (congestive) heart failure: Secondary | ICD-10-CM | POA: Diagnosis not present

## 2023-02-24 DIAGNOSIS — E1159 Type 2 diabetes mellitus with other circulatory complications: Secondary | ICD-10-CM | POA: Diagnosis not present

## 2023-03-22 DIAGNOSIS — I10 Essential (primary) hypertension: Secondary | ICD-10-CM | POA: Diagnosis not present

## 2023-03-26 DIAGNOSIS — I5032 Chronic diastolic (congestive) heart failure: Secondary | ICD-10-CM | POA: Diagnosis not present

## 2023-03-26 DIAGNOSIS — Z515 Encounter for palliative care: Secondary | ICD-10-CM | POA: Diagnosis not present

## 2023-03-26 DIAGNOSIS — I272 Pulmonary hypertension, unspecified: Secondary | ICD-10-CM | POA: Diagnosis not present

## 2023-03-29 DIAGNOSIS — M79674 Pain in right toe(s): Secondary | ICD-10-CM | POA: Diagnosis not present

## 2023-03-29 DIAGNOSIS — M79675 Pain in left toe(s): Secondary | ICD-10-CM | POA: Diagnosis not present

## 2023-03-29 DIAGNOSIS — E1151 Type 2 diabetes mellitus with diabetic peripheral angiopathy without gangrene: Secondary | ICD-10-CM | POA: Diagnosis not present

## 2023-03-29 DIAGNOSIS — B351 Tinea unguium: Secondary | ICD-10-CM | POA: Diagnosis not present

## 2023-04-21 ENCOUNTER — Ambulatory Visit: Payer: Medicare Other | Admitting: Cardiology

## 2023-04-21 DIAGNOSIS — I10 Essential (primary) hypertension: Secondary | ICD-10-CM | POA: Diagnosis not present

## 2023-05-03 ENCOUNTER — Other Ambulatory Visit: Payer: Self-pay | Admitting: Cardiology

## 2023-05-05 ENCOUNTER — Ambulatory Visit: Payer: Medicare Other | Admitting: Cardiology

## 2023-05-07 DIAGNOSIS — I5032 Chronic diastolic (congestive) heart failure: Secondary | ICD-10-CM | POA: Diagnosis not present

## 2023-05-07 DIAGNOSIS — I272 Pulmonary hypertension, unspecified: Secondary | ICD-10-CM | POA: Diagnosis not present

## 2023-05-07 DIAGNOSIS — Z515 Encounter for palliative care: Secondary | ICD-10-CM | POA: Diagnosis not present

## 2023-06-09 DIAGNOSIS — Z515 Encounter for palliative care: Secondary | ICD-10-CM | POA: Diagnosis not present

## 2023-06-09 DIAGNOSIS — I272 Pulmonary hypertension, unspecified: Secondary | ICD-10-CM | POA: Diagnosis not present

## 2023-06-09 DIAGNOSIS — I5032 Chronic diastolic (congestive) heart failure: Secondary | ICD-10-CM | POA: Diagnosis not present

## 2023-06-14 DIAGNOSIS — E1151 Type 2 diabetes mellitus with diabetic peripheral angiopathy without gangrene: Secondary | ICD-10-CM | POA: Diagnosis not present

## 2023-06-14 DIAGNOSIS — B351 Tinea unguium: Secondary | ICD-10-CM | POA: Diagnosis not present

## 2023-06-14 DIAGNOSIS — M79675 Pain in left toe(s): Secondary | ICD-10-CM | POA: Diagnosis not present

## 2023-06-14 DIAGNOSIS — M79674 Pain in right toe(s): Secondary | ICD-10-CM | POA: Diagnosis not present

## 2023-08-30 DIAGNOSIS — M79675 Pain in left toe(s): Secondary | ICD-10-CM | POA: Diagnosis not present

## 2023-08-30 DIAGNOSIS — M79674 Pain in right toe(s): Secondary | ICD-10-CM | POA: Diagnosis not present

## 2023-08-30 DIAGNOSIS — B351 Tinea unguium: Secondary | ICD-10-CM | POA: Diagnosis not present

## 2023-08-30 DIAGNOSIS — E1151 Type 2 diabetes mellitus with diabetic peripheral angiopathy without gangrene: Secondary | ICD-10-CM | POA: Diagnosis not present

## 2023-09-08 DIAGNOSIS — E119 Type 2 diabetes mellitus without complications: Secondary | ICD-10-CM | POA: Diagnosis not present

## 2023-09-23 DIAGNOSIS — I5032 Chronic diastolic (congestive) heart failure: Secondary | ICD-10-CM | POA: Diagnosis not present

## 2023-09-23 DIAGNOSIS — Z515 Encounter for palliative care: Secondary | ICD-10-CM | POA: Diagnosis not present

## 2023-09-23 DIAGNOSIS — I272 Pulmonary hypertension, unspecified: Secondary | ICD-10-CM | POA: Diagnosis not present

## 2023-10-10 NOTE — Progress Notes (Unsigned)
Cardiology Office Note:  .   Date:  10/11/2023  ID:  [name], DOB [birth year], MRN [id number] PCP: Irena Reichmann, DO  [facility] Providers Cardiologist:  Yates Decamp, MD   History of Present Illness: [name] is a 89 y.o. female  AAF patient with primary pulmonary hypertension,  cryptogenic cirrhosis of the liver, presently on Opsumit since July 2018, did not tolerate Adcirca or Sildanafil due to visual side effects. She has history of breast cancer status post radical mastectomy and chemotherapy, well controlled diabetes mellitus, moderate coronary artery disease and bilateral carotid artery occlusion without stroke and is on chronic plavix. No recent hospital visits or ED visits. Denies leg edema, denies worsening dyspnea, no chest pain or palpitations. This is a 42-month office visit.   Discussed the use of AI scribe software for clinical note transcription with the patient, who gave verbal consent to proceed.  History of Present Illness   The patient, with a history of primary pulmonary hypertension and chronic diastolic heart failure, presents for a routine follow-up. She reports feeling 'about the same' as usual and has not had any recent emergency department visits. She has not experienced any changes in her breathing or abdominal swelling. She has not had any leg edema. The patient is currently on continuous oxygen therapy. She has not had any recent hospitalizations. The patient's blood pressure has been well controlled on her current medical regimen. She has a DNR status.        Review of Systems  Cardiovascular:  Positive for dyspnea on exertion. Negative for chest pain and leg swelling.    Labs   Lab Results  Component Value Date   CHOL 165 01/02/2016   HDL 58 01/02/2016   LDLCALC 86 01/02/2016   TRIG 106 01/02/2016   CHOLHDL 2.8 01/02/2016   Lab Results  Component Value Date   NA 135 12/14/2021   K 4.7 12/14/2021   CO2 31 12/14/2021   GLUCOSE  235 (H) 12/14/2021   BUN 31 (H) 12/14/2021   CREATININE 1.12 (H) 12/14/2021   CALCIUM 9.2 12/14/2021   GFRNONAA 47 (L) 12/14/2021      Latest Ref Rng & Units 12/14/2021    1:23 PM 12/08/2021    2:22 PM 07/09/2020    4:18 PM  BMP  Glucose 70 - 99 mg/dL 409   811   BUN 8 - 23 mg/dL 31   22   Creatinine 9.14 - 1.00 mg/dL 7.82  9.56  2.13   Sodium 135 - 145 mmol/L 135   134   Potassium 3.5 - 5.1 mmol/L 4.7   3.6   Chloride 98 - 111 mmol/L 98   98   CO2 22 - 32 mmol/L 31   26   Calcium 8.9 - 10.3 mg/dL 9.2   8.9        Latest Ref Rng & Units 12/14/2021    1:23 PM 07/09/2020    4:22 PM 12/26/2018   11:13 AM  CBC  WBC 4.0 - 10.5 K/uL 3.0  4.5  4.1   Hemoglobin 12.0 - 15.0 g/dL 08.6  57.8  46.9   Hematocrit 36.0 - 46.0 % 39.3  35.9  35.7   Platelets 150 - 400 K/uL 173  182  219      External labs:  Labs 02/24/2023:  Hb 11.6/HCT 35.1, platelets 170, mild macrocytosis present.  Serum glucose 146 mg, BUN 31, creatinine 1.20, EGFR 43 mL, potassium 4.0, LFTs normal.  Total cholesterol 201, triglycerides 135, HDL 68, LDL 110.  A1c 7.7%.  TSH normal at 2.880.  Physical Exam:   VS:  BP 128/68 (BP Location: Right Arm, Patient Position: Sitting, Cuff Size: Normal)   Pulse 65   Resp 16   Ht 5\' 4"  (1.626 m)   Wt 149 lb 9.6 oz (67.9 kg)   SpO2 93%   BMI 25.68 kg/m    Wt Readings from Last 3 Encounters:  10/11/23 149 lb 9.6 oz (67.9 kg)  10/22/22 151 lb (68.5 kg)  07/15/22 145 lb 14.4 oz (66.2 kg)    Physical Exam Constitutional:      General: She is not in acute distress.    Appearance: She is well-developed.  Neck:     Vascular: Carotid bruit (right more prominant) present. No JVD.  Cardiovascular:     Rate and Rhythm: Normal rate and regular rhythm.     Pulses: Normal pulses and intact distal pulses.     Heart sounds: Murmur heard.     Blowing midsystolic murmur is present with a grade of 2/6 at the lower left sternal border.     No gallop.  Pulmonary:     Effort:  Pulmonary effort is normal.     Breath sounds: Examination of the right-middle field reveals rales. Examination of the right-lower field reveals rhonchi and rales. Examination of the left-lower field reveals rales. Rhonchi and rales present.  Abdominal:     General: Bowel sounds are normal.     Palpations: Abdomen is soft.  Musculoskeletal:     Cervical back: Neck supple.     Right lower leg: No edema.     Left lower leg: No edema.  Skin:    Capillary Refill: Capillary refill takes less than 2 seconds.     Studies Reviewed: .    Echocardiogram 08/27/2021: Left ventricle cavity is normal in size. Mild concentric hypertrophy of the left ventricle. Normal global wall motion. Normal LV systolic function with visual EF 50-55%. Doppler evidence of grade II (pseudonormal) diastolic dysfunction, elevated LAP. Left atrial cavity is moderately dilated. Mildly restricted mitral valve leaflets. Mild (Grade I) mitral regurgitation. Moderate tricuspid regurgitation. Estimated pulmonary artery systolic pressure 45 mmHg. Mild pulmonic regurgitation. Previous study on 01/03/2020 showed mod RA dilatation, estimated PASP 54 mmHg.  EKG:    EKG Interpretation Date/Time:  Monday October 11 2023 14:06:47 EST Ventricular Rate:  64 PR Interval:  436 QRS Duration:  94 QT Interval:  440 QTC Calculation: 453 R Axis:   74  Text Interpretation: EKG 10/11/2023: Sinus rhythm with marked first-degree AV block at the rate of 64 bpm, right atrial enlargement, rightward axis.  No evidence of ischemia.  Compared to 12/14/2021, no significant change. Confirmed by Delrae Rend [phone]) on 10/11/2023 2:28:52 PM    Medications and allergies    Allergies  Allergen Reactions   Aricept [Donepezil Hcl]     Hives, vomiting and headache   Namenda [Memantine Hcl]     hives   Adcirca [Tadalafil] Other (See Comments)    Caused blindness in one eye   Donepezil Nausea Only   Spironolactone Other (See Comments)     Dizziness, balance problems   Codeine Rash   Indocin [Indomethacin] Rash   Latex Rash   Penicillins Rash   Current Outpatient Medications:    acetaminophen (TYLENOL) 500 MG tablet, Take 500 mg by mouth every 8 (eight) hours as needed for mild pain or moderate pain., Disp: , Rfl:    ALPRAZolam (  XANAX) 0.5 MG tablet, Take 0.5 mg by mouth 2 (two) times daily as needed for anxiety. , Disp: , Rfl:    atorvastatin (LIPITOR) 10 MG tablet, Take 1 tablet (10 mg total) by mouth daily., Disp: 30 tablet, Rfl: 0   cetirizine (ZYRTEC) 10 MG chewable tablet, Chew 10 mg by mouth daily., Disp: , Rfl:    cholecalciferol (VITAMIN D) 1000 UNITS tablet, Take 2,000 Units by mouth every morning., Disp: , Rfl:    citalopram (CELEXA) 20 MG tablet, Take 1 tablet by mouth daily., Disp: , Rfl:    clopidogrel (PLAVIX) 75 MG tablet, Take 75 mg by mouth daily., Disp: , Rfl:    docusate sodium (COLACE) 100 MG capsule, Take 100 mg by mouth 2 (two) times daily., Disp: , Rfl:    feeding supplement, GLUCERNA SHAKE, (GLUCERNA SHAKE) LIQD, Take 237 mLs by mouth daily as needed. , Disp: , Rfl:    FREESTYLE LITE test strip, 1 each 3 (three) times daily., Disp: , Rfl:    furosemide (LASIX) 40 MG tablet, Take 1.5 tablets (60 mg total) by mouth daily., Disp: 135 tablet, Rfl: 3   hydrALAZINE (APRESOLINE) 10 MG tablet, TAKE 1 TABLET IN THE MORNING AND AT BEDTIME, Disp: 180 tablet, Rfl: 3   insulin aspart (NOVOLOG) 100 UNIT/ML FlexPen, Inject 5 Units into the skin 3 (three) times daily with meals. When BG is over 100, Disp: , Rfl:    insulin glargine (LANTUS) 100 UNIT/ML Solostar Pen, Inject 8 Units into the skin at bedtime. , Disp: , Rfl:    macitentan (OPSUMIT) 10 MG tablet, Take 1 tablet (10 mg total) by mouth daily., Disp: 90 tablet, Rfl: 3   metoprolol tartrate (LOPRESSOR) 25 MG tablet, Take 12.5 mg by mouth 2 (two) times daily. 12.5mg  in the AM, 12.5mg  in the PM, Disp: , Rfl:    mirtazapine (REMERON) 30 MG tablet, Take 30 mg by  mouth at bedtime., Disp: , Rfl:    Multiple Vitamin (MULTIVITAMIN) tablet, Take 1 tablet by mouth daily. Reported on 02/06/2016, Disp: , Rfl:    olmesartan (BENICAR) 5 MG tablet, TAKE 1 TABLET DAILY, Disp: 90 tablet, Rfl: 3   pantoprazole (PROTONIX) 40 MG tablet, TAKE 1 TABLET TWICE A DAY BEFORE MEALS (Patient taking differently: 40 mg daily.), Disp: 180 tablet, Rfl: 3   Polyethyl Glycol-Propyl Glycol (SYSTANE OP), Apply to eye as needed., Disp: , Rfl:    potassium chloride (KLOR-CON) 10 MEQ tablet, TAKE 1 TABLET DAILY, Disp: 90 tablet, Rfl: 3   sodium chloride (OCEAN) 0.65 % SOLN nasal spray, Place 1 spray into both nostrils as needed for congestion., Disp: , Rfl:    thyroid (ARMOUR) 15 MG tablet, Take 15 mg by mouth daily., Disp: , Rfl:    umeclidinium bromide (INCRUSE ELLIPTA) 62.5 MCG/INH AEPB, Inhale 1 puff into the lungs daily., Disp: , Rfl:    ursodiol (ACTIGALL) 500 MG tablet, TAKE 1 TABLET TWICE A DAY WITH MEALS, Disp: 180 tablet, Rfl: 3   Wheat Dextrin (BENEFIBER PO), Take by mouth daily., Disp: , Rfl:      ASSESSMENT AND PLAN: .      ICD-10-CM   1. Primary pulmonary hypertension (HCC)  I27.0 EKG 12-Lead    2. Diastolic CHF, chronic (HCC)  I50.32     3. Essential hypertension  I10     4. DNR (do not resuscitate)  Z66       1. Primary pulmonary hypertension (HCC) Patient with primary pulmonary hypertension, presently doing well with Opsumit  10 mg daily, we have not escalated her therapy as she has remained stable and she is presently 89 years of age.  No clinical evidence of heart failure.  Examination and EKG is unchanged from prior examination 6 months ago fortunately.  2. Diastolic CHF, chronic (HCC) Patient has not had any recent hospitalization.  There is no leg edema, no JVD.  Overall stable, presently also on hydralazine, furosemide and olmesartan for chronic diastolic heart failure along with potassium supplements, continue same.  3. Essential hypertension Blood  pressure is well-controlled on present medical regimen, no changes were done today.  She is also on metoprolol to tartrate along with ARB and Lasix 60 mg daily and hydralazine 10 mg twice daily.  I reviewed external labs, renal function has remained stable as well.  LFTs are normal as well.  4. DNR (do not resuscitate) Patient has DNR status.  As she has done well, I did not place any orders for repeating echocardiogram, 6-minute walk test also not performed.  She is presently on continuous oxygen, continue the same.  I will see her back in 6 months or sooner if problems.  Signed,  Yates Decamp, MD, [facility] 10/11/2023, 4:30 PM [facility] [address] [place] [address] [place], [place] [address] Phone: [phone]. Fax:  [phone]

## 2023-10-11 ENCOUNTER — Ambulatory Visit: Payer: Medicare Other | Attending: Cardiology | Admitting: Cardiology

## 2023-10-11 ENCOUNTER — Encounter: Payer: Self-pay | Admitting: Cardiology

## 2023-10-11 VITALS — BP 128/68 | HR 65 | Resp 16 | Ht 64.0 in | Wt 149.6 lb

## 2023-10-11 DIAGNOSIS — I10 Essential (primary) hypertension: Secondary | ICD-10-CM | POA: Diagnosis not present

## 2023-10-11 DIAGNOSIS — I5032 Chronic diastolic (congestive) heart failure: Secondary | ICD-10-CM | POA: Insufficient documentation

## 2023-10-11 DIAGNOSIS — Z66 Do not resuscitate: Secondary | ICD-10-CM | POA: Insufficient documentation

## 2023-10-11 DIAGNOSIS — I270 Primary pulmonary hypertension: Secondary | ICD-10-CM | POA: Diagnosis not present

## 2023-10-11 NOTE — Patient Instructions (Signed)
Medication Instructions:  Your physician recommends that you continue on your current medications as directed. Please refer to the Current Medication list given to you today.  *If you need a refill on your cardiac medications before your next appointment, please call your pharmacy*   Lab Work: none If you have labs (blood work) drawn today and your tests are completely normal, you will receive your results only by: MyChart Message (if you have MyChart) OR A paper copy in the mail If you have any lab test that is abnormal or we need to change your treatment, we will call you to review the results.   Testing/Procedures: none   Follow-Up: At [facility], you and your health needs are our priority.  As part of our continuing mission to provide you with exceptional heart care, we have created designated Provider Care Teams.  These Care Teams include your primary Cardiologist (physician) and Advanced Practice Providers (APPs -  Physician Assistants and Nurse Practitioners) who all work together to provide you with the care you need, when you need it.  We recommend signing up for the patient portal called "MyChart".  Sign up information is provided on this After Visit Summary.  MyChart is used to connect with patients for Virtual Visits (Telemedicine).  Patients are able to view lab/test results, encounter notes, upcoming appointments, etc.  Non-urgent messages can be sent to your provider as well.   To learn more about what you can do with MyChart, go to [email or web address].    Your next appointment:   6 month(s)  Provider:   Yates Decamp, MD     Other Instructions

## 2023-10-20 DIAGNOSIS — Z23 Encounter for immunization: Secondary | ICD-10-CM | POA: Diagnosis not present

## 2023-11-04 DIAGNOSIS — I272 Pulmonary hypertension, unspecified: Secondary | ICD-10-CM | POA: Diagnosis not present

## 2023-11-04 DIAGNOSIS — I5032 Chronic diastolic (congestive) heart failure: Secondary | ICD-10-CM | POA: Diagnosis not present

## 2023-11-04 DIAGNOSIS — Z515 Encounter for palliative care: Secondary | ICD-10-CM | POA: Diagnosis not present

## 2023-11-15 DIAGNOSIS — M79675 Pain in left toe(s): Secondary | ICD-10-CM | POA: Diagnosis not present

## 2023-11-15 DIAGNOSIS — E1151 Type 2 diabetes mellitus with diabetic peripheral angiopathy without gangrene: Secondary | ICD-10-CM | POA: Diagnosis not present

## 2023-11-15 DIAGNOSIS — B351 Tinea unguium: Secondary | ICD-10-CM | POA: Diagnosis not present

## 2023-11-15 DIAGNOSIS — M79674 Pain in right toe(s): Secondary | ICD-10-CM | POA: Diagnosis not present

## 2023-12-15 ENCOUNTER — Other Ambulatory Visit: Payer: Self-pay | Admitting: Cardiology

## 2023-12-15 DIAGNOSIS — I10 Essential (primary) hypertension: Secondary | ICD-10-CM

## 2023-12-31 ENCOUNTER — Other Ambulatory Visit: Payer: Self-pay | Admitting: Cardiology

## 2023-12-31 DIAGNOSIS — I270 Primary pulmonary hypertension: Secondary | ICD-10-CM

## 2024-01-03 ENCOUNTER — Other Ambulatory Visit: Payer: Self-pay | Admitting: Internal Medicine

## 2024-01-03 DIAGNOSIS — K743 Primary biliary cirrhosis: Secondary | ICD-10-CM

## 2024-01-03 NOTE — Telephone Encounter (Signed)
 Pt needs an ov before anymore refills

## 2024-02-29 ENCOUNTER — Encounter: Payer: Self-pay | Admitting: Gastroenterology

## 2024-02-29 ENCOUNTER — Ambulatory Visit: Payer: Medicare Other | Admitting: Gastroenterology

## 2024-02-29 DIAGNOSIS — Z8719 Personal history of other diseases of the digestive system: Secondary | ICD-10-CM | POA: Diagnosis not present

## 2024-02-29 DIAGNOSIS — K743 Primary biliary cirrhosis: Secondary | ICD-10-CM | POA: Diagnosis not present

## 2024-02-29 MED ORDER — URSODIOL 500 MG PO TABS: 500.0000 mg | ORAL_TABLET | Freq: Two times a day (BID) | ORAL | 3 refills | Status: AC

## 2024-02-29 NOTE — Progress Notes (Signed)
 Gastroenterology Office Note     Primary Care Physician:  Pete Brand, DO  Primary Gastroenterologist: Dr. Mordechai April   Chief Complaint   Chief Complaint  Patient presents with   Medication Refill    Needs medications refilled. Having some problems with breathing and coughing      History of Present Illness   [name] is a 89 y.o. female presenting today with a history of PBC, concern for cirrhosis in past but not on recent imaging, pancreatic cyst on surveillance imaging, interstitial disease with chronic respiratory failure and when last seen in 2023 had decided to focus on palliative care, chronic GERD.   When last seen in Aug 2023, she had wanted to hold off on blood work for chronic liver disease.   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 Surveillance Dec 2025.   Patient is here with her daughter. She has had steady health decline. Followed by Pulmonology for pulmonary hypertension, currently on continuous oxygen . Her main issue is pulmonary status. Notes coughing intermittently. No abdominal pain, N/V. No overt GI bleeding. She wants to treat the treatable. She desires to streamline care. Daughter is curious if able to have PCP take over refills for Urso , as she has wanted to hold off on blood work and imaging further. She would also like to avoid so many multiple appointments.    Past Medical History:  Diagnosis Date   Acute delirium 06/23/2013   Acute diastolic congestive heart failure (HCC) 03/21/2014   Anxiety    Cancer (HCC)    Carotid artery disease (HCC)    Bilateral ICA occlusion   Chronic headaches    Chronic liver disease    ?cirrhosis on 10/2014 CT. H/O elevated alkphos and +AMA on URSO .   Coronary atherosclerosis of native coronary artery    Mild nonobstructive disease at cardiac catheterization May 2011    Depression    Essential hypertension, benign    Fracture of femoral neck, right, closed (HCC) 03/28/2014   History of breast cancer    History of GI bleed    Hyperlipidemia    Orthostatic hypotension    Diabetic polyneuropathy/diabetic dysautonomia    Osteopenia    Parotid tumor    Pneumonia 11/2016   Pneumonia    Pressure ulcer, heel, right, unstageable (HCC) 04/17/2014   PSVT (paroxysmal supraventricular tachycardia) (HCC)    Syncope and collapse 04/15/2010   Qualifier: Diagnosis of  By: Abel Hoe, MD, Christopher     Type 2 diabetes mellitus (HCC)    UPPER GASTROINTESTINAL HEMORRHAGE 03/21/2007   Qualifier: Diagnosis of  By: Nana Avers MD, Blain Bulls      Past Surgical History:  Procedure Laterality Date   ABDOMINAL HYSTERECTOMY     APPENDECTOMY     CATARACT EXTRACTION Bilateral    Dr. Gennie Kicks   CHOLECYSTECTOMY  2008   COLONOSCOPY  2010   Dr. Nolene Baumgarten: single tubular adenoma, one hyperplastic polyp. Next TCS 2020 health permitting.   ESOPHAGOGASTRODUODENOSCOPY N/A 08/17/2016   web in upper third of esophagus s/p dilation, five gastric ulcers without bleeding   ESOPHAGOGASTRODUODENOSCOPY N/A 01/01/2017    normal esophagus, gastritis   HEMORRHOID SURGERY  1960s   HIP ARTHROPLASTY Right 03/28/2014   Procedure: ARTHROPLASTY BIPOLAR HIP;  Surgeon: Darrin Emerald, MD;  Location: [facility];  Service: Orthopedics;  Laterality: Right;   LAPAROSCOPIC APPENDECTOMY N/A 04/13/2013   Procedure: APPENDECTOMY  LAPAROSCOPIC;  Surgeon: Lovena Rubinstein, MD;  Location: [facility];  Service: General;  Laterality: N/A;   MASTECTOMY  1994   Left breast -related to breast cancer   RIGHT HEART CATH N/A 04/29/2017   Procedure: Right Heart Cath;  Surgeon: Knox Perl, MD;  Location: [facility] INVASIVE CV LAB;  Service: Cardiovascular;  Laterality: N/A;   RIGHT/LEFT HEART CATH AND CORONARY ANGIOGRAPHY N/A 04/13/2017   Procedure: RIGHT/LEFT HEART CATH AND CORONARY ANGIOGRAPHY;  Surgeon: Knox Perl, MD;  Location: [facility] INVASIVE CV  LAB;  Service: Cardiovascular;  Laterality: N/A;   SAVORY DILATION N/A 08/17/2016   Procedure: SAVORY DILATION;  Surgeon: Alyce Jubilee, MD;  Location: [facility] SUITE;  Service: Endoscopy;  Laterality: N/A;   VESICOVAGINAL FISTULA CLOSURE W/ TAH  1979   YAG LASER APPLICATION Left    Dr. Seward Dao    Current Outpatient Medications  Medication Sig Dispense Refill   acetaminophen  (TYLENOL ) 500 MG tablet Take 500 mg by mouth every 8 (eight) hours as needed for mild pain or moderate pain.     ALPRAZolam  (XANAX ) 0.5 MG tablet Take 0.5 mg by mouth 2 (two) times daily as needed for anxiety.      atorvastatin  (LIPITOR) 10 MG tablet Take 1 tablet (10 mg total) by mouth daily. 30 tablet 0   cetirizine (ZYRTEC) 10 MG chewable tablet Chew 10 mg by mouth daily.     cholecalciferol  (VITAMIN D ) 1000 UNITS tablet Take 2,000 Units by mouth every morning.     citalopram  (CELEXA ) 20 MG tablet Take 1 tablet by mouth daily.     clopidogrel  (PLAVIX ) 75 MG tablet Take 75 mg by mouth daily.     docusate sodium  (COLACE) 100 MG capsule Take 100 mg by mouth 2 (two) times daily.     feeding supplement, GLUCERNA SHAKE, (GLUCERNA SHAKE) LIQD Take 237 mLs by mouth daily as needed.      FREESTYLE LITE test strip 1 each 3 (three) times daily.     furosemide  (LASIX ) 40 MG tablet Take 1.5 tablets (60 mg total) by mouth daily. 135 tablet 3   hydrALAZINE  (APRESOLINE ) 10 MG tablet TAKE 1 TABLET IN THE MORNING AND AT BEDTIME 180 tablet 1   insulin  aspart (NOVOLOG ) 100 UNIT/ML FlexPen Inject 5 Units into the skin 3 (three) times daily with meals. When BG is over 100     insulin  glargine (LANTUS ) 100 UNIT/ML Solostar Pen Inject 8 Units into the skin at bedtime.      macitentan  (OPSUMIT ) 10 MG tablet Take 1 tablet (10 mg total) by mouth daily. 90 tablet 3   metoprolol  tartrate (LOPRESSOR ) 25 MG tablet Take 12.5 mg by mouth 2 (two) times daily. 12.5mg  in the AM, 12.5mg  in the PM     mirtazapine  (REMERON ) 30 MG tablet Take 30 mg by mouth  at bedtime.     Multiple Vitamin (MULTIVITAMIN) tablet Take 1 tablet by mouth daily. Reported on 02/06/2016     olmesartan  (BENICAR ) 5 MG tablet TAKE 1 TABLET DAILY 90 tablet 3   pantoprazole  (PROTONIX ) 40 MG tablet TAKE 1 TABLET TWICE A DAY BEFORE MEALS (Patient taking differently: 40 mg daily.) 180 tablet 3   Polyethyl Glycol-Propyl Glycol (SYSTANE OP) Apply to eye as needed.     potassium chloride  (KLOR-CON ) 10 MEQ tablet TAKE 1 TABLET DAILY 90 tablet 3   sodium chloride  (OCEAN) 0.65 % SOLN nasal spray Place 1 spray into both nostrils as needed for congestion.     thyroid  (ARMOUR) 15 MG  tablet Take 15 mg by mouth daily.     umeclidinium bromide  (INCRUSE ELLIPTA ) 62.5 MCG/INH AEPB Inhale 1 puff into the lungs daily.     ursodiol  (ACTIGALL ) 500 MG tablet TAKE 1 TABLET TWICE A DAY WITH MEALS 180 tablet 3   Wheat Dextrin (BENEFIBER PO) Take by mouth daily.     No current facility-administered medications for this visit.    Allergies as of 02/29/2024 - Review Complete 02/29/2024  Allergen Reaction Noted   Aricept [donepezil hcl]  02/16/2014   Namenda [memantine hcl]  02/16/2014   Adcirca [tadalafil] Other (See Comments) 04/20/2019   Donepezil Nausea Only 01/20/2016   Spironolactone  Other (See Comments) 12/16/2015   Codeine Rash 02/16/2014   Indocin  [indomethacin ] Rash 09/11/2014   Latex Rash 02/16/2014   Penicillins Rash 01/11/2007    Family History  Problem Relation Age of Onset   Heart failure Mother    Kidney cancer Mother    Cancer Mother    Hypertension Mother    Lung cancer Father        Brain METS, smoked   Cancer Father    Kidney cancer Sister    Cancer Sister    Hypertension Sister    Cirrhosis Brother    Diabetes Son    Heart attack Neg Hx    Stroke Neg Hx     Social History   Socioeconomic History   Marital status: Divorced    Spouse name: Not on file   Number of children: 4   Years of education: Not on file   Highest education level: Not on file   Occupational History   Occupation: RETIRED FACTORY WORKER    Comment: [employer]  Tobacco Use   Smoking status: Former    Current packs/day: 0.00    Average packs/day: 0.5 packs/day for 40.0 years (20.0 ttl pk-yrs)    Types: Cigarettes    Start date: 03/30/1973    Quit date: 03/30/2013    Years since quitting: 10.9    Passive exposure: Past   Smokeless tobacco: Never  Vaping Use   Vaping status: Never Used  Substance and Sexual Activity   Alcohol  use: No    Alcohol /week: 0.0 standard drinks of alcohol    Drug use: No   Sexual activity: Not Currently  Other Topics Concern   Not on file  Social History Narrative   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 Started age 21.Drug use -noAlcohol use - noLives with sonEducation - 12 th grade   Social Drivers of Corporate investment banker Strain: Not on file  Food Insecurity: No Food Insecurity (01/30/2020)   Hunger Vital Sign    Worried About Running Out of Food in the Last Year: Never true    Ran Out of Food in the Last Year: Never true  Transportation Needs: No Transportation Needs (01/30/2020)   PRAPARE - Administrator, Civil Service (Medical): No    Lack of Transportation (Non-Medical): No  Physical Activity: Not on file  Stress: Not on file  Social Connections: Not on file  Intimate Partner Violence: Not on file     Review of Systems   Gen: Denies any fever, chills, fatigue, weight loss, lack of appetite.  CV: Denies chest pain, heart palpitations, peripheral edema, syncope.  Resp: Denies shortness of breath at rest or with exertion. Denies wheezing or cough.  GI: Denies dysphagia or odynophagia. Denies jaundice, hematemesis, fecal incontinence. GU : Denies urinary burning, urinary frequency, urinary hesitancy MS: Denies joint pain, muscle weakness, cramps, or  limitation of movement.  Derm: Denies rash, itching, dry skin Psych: Denies depression, anxiety, memory loss, and confusion Heme: Denies bruising, bleeding, and enlarged lymph nodes.   Physical Exam   BP (!) 191/77 (BP Location: Right Arm, Patient Position: Sitting, Cuff Size: Normal)   Pulse 73   Temp 97.8 F (36.6 C) (Temporal)   Ht 5\' 4"  (1.626 m)   Wt 149 lb 3.2 oz (67.7 kg)   BMI 25.61 kg/m  General:   Alert and oriented. Pleasant and cooperative. On 02 nasal cannula. Frail.  Head:  Normocephalic and atraumatic. Eyes:  Without icterus Lungs: scattered rhonchi, no wheezing Abdomen:  +BS, soft, non-tender and non-distended. No HSM noted. No guarding or rebound. No masses appreciated.  Rectal:  Deferred  Msk:  Symmetrical without gross deformities. Normal posture. Extremities:  Without edema.  Lab Results  Component Value Date   ALT 15 07/09/2020   AST 22 07/09/2020   ALKPHOS 85 07/09/2020   BILITOT 0.4 07/09/2020   Lab Results  Component Value Date   NA 135 03/03/2024   CL 98 03/03/2024   K 3.9 03/03/2024   CO2 30 03/03/2024   BUN 30 (H) 03/03/2024   CREATININE 1.22 (H) 03/03/2024   GFRNONAA 42 (L) 03/03/2024   CALCIUM  9.2 03/03/2024   PHOS 3.3 07/19/2018   ALBUMIN  3.9 07/09/2020   GLUCOSE 201 (H) 03/03/2024   Lab Results  Component Value Date   WBC 3.6 (L) 03/03/2024   HGB 10.6 (L) 03/03/2024   HCT 32.3 (L) 03/03/2024   MCV 104.9 (H) 03/03/2024   PLT 157 03/03/2024      Assessment   [name] is a delightful 89 y.o. female presenting today with a history of PBC, concern for cirrhosis in past but not on recent imaging, pancreatic cyst on surveillance imaging, pulmonary hypertension on continuous O2, chronic GERD, and last seen in 2023.   PBC: she would like to hold off on labs and imaging. She desires to continue Urso  500 mg BID. Her daughter and patient would like to consider streamlining visits and perhaps have PCP take over Urso  prescription. I  am certainly understanding especially in light of her age and multimorbidities. We can have her return prn if anything worsens.  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 Holding off on this as well.     PLAN    I reached out to Alva Auer, NP with palliative care on 4/30. She will contact patient and discuss streamlining care.  I have refilled Urso . Further refills per PCP if they are willing We can see patient prn due to her goals of care and palliative status   Delman Ferns, PhD, ANP-BC [facility] Gastroenterology

## 2024-02-29 NOTE — Patient Instructions (Signed)
 I have refilled Urso for you.  I will talk with my friend Sallyanne Creamer about getting everything streamlined for you so primary care can be with the company she is with as well!  We will see you as needed, and please reach out with any concerns!  I enjoyed seeing you again today! I value our relationship and want to provide genuine, compassionate, and quality care. You may receive a survey regarding your visit with me, and I welcome your feedback! Thanks so much for taking the time to complete this. I look forward to seeing you again.      Delman Ferns, PhD, ANP-BC [facility] Gastroenter[facility]

## 2024-03-03 ENCOUNTER — Emergency Department (HOSPITAL_COMMUNITY)

## 2024-03-03 ENCOUNTER — Other Ambulatory Visit: Payer: Self-pay

## 2024-03-03 ENCOUNTER — Encounter (HOSPITAL_COMMUNITY): Payer: Self-pay

## 2024-03-03 ENCOUNTER — Emergency Department (HOSPITAL_COMMUNITY)
Admission: EM | Admit: 2024-03-03 | Discharge: 2024-03-03 | Disposition: A | Discharge status: Home / Self Care | Attending: Emergency Medicine | Admitting: Emergency Medicine

## 2024-03-03 DIAGNOSIS — Z91040 Latex allergy status: Secondary | ICD-10-CM | POA: Insufficient documentation

## 2024-03-03 DIAGNOSIS — S0990XA Unspecified injury of head, initial encounter: Secondary | ICD-10-CM

## 2024-03-03 DIAGNOSIS — E119 Type 2 diabetes mellitus without complications: Secondary | ICD-10-CM | POA: Insufficient documentation

## 2024-03-03 DIAGNOSIS — W01198A Fall on same level from slipping, tripping and stumbling with subsequent striking against other object, initial encounter: Secondary | ICD-10-CM | POA: Diagnosis not present

## 2024-03-03 DIAGNOSIS — I509 Heart failure, unspecified: Secondary | ICD-10-CM | POA: Insufficient documentation

## 2024-03-03 DIAGNOSIS — S0083XA Contusion of other part of head, initial encounter: Secondary | ICD-10-CM | POA: Diagnosis not present

## 2024-03-03 DIAGNOSIS — Z794 Long term (current) use of insulin: Secondary | ICD-10-CM | POA: Diagnosis not present

## 2024-03-03 LAB — BASIC METABOLIC PANEL WITH GFR
Anion gap: 7 (ref 5–15)
BUN: 30 mg/dL — ABNORMAL HIGH (ref 8–23)
CO2: 30 mmol/L (ref 22–32)
Calcium: 9.2 mg/dL (ref 8.9–10.3)
Chloride: 98 mmol/L (ref 98–111)
Creatinine, Ser: 1.22 mg/dL — ABNORMAL HIGH (ref 0.44–1.00)
GFR, Estimated: 42 mL/min — ABNORMAL LOW (ref >60)
Glucose, Bld: 201 mg/dL — ABNORMAL HIGH (ref 70–99)
Potassium: 3.9 mmol/L (ref 3.5–5.1)
Sodium: 135 mmol/L (ref 135–145)

## 2024-03-03 LAB — CBC WITH DIFFERENTIAL/PLATELET
Abs Immature Granulocytes: 0.01 10*3/uL (ref 0.00–0.07)
Basophils Absolute: 0 10*3/uL (ref 0.0–0.1)
Basophils Relative: 1 %
Eosinophils Absolute: 0.1 10*3/uL (ref 0.0–0.5)
Eosinophils Relative: 3 %
HCT: 32.3 % — ABNORMAL LOW (ref 36.0–46.0)
Hemoglobin: 10.6 g/dL — ABNORMAL LOW (ref 12.0–15.0)
Immature Granulocytes: 0 %
Lymphocytes Relative: 53 %
Lymphs Abs: 1.9 10*3/uL (ref 0.7–4.0)
MCH: 34.4 pg — ABNORMAL HIGH (ref 26.0–34.0)
MCHC: 32.8 g/dL (ref 30.0–36.0)
MCV: 104.9 fL — ABNORMAL HIGH (ref 80.0–100.0)
Monocytes Absolute: 0.3 10*3/uL (ref 0.1–1.0)
Monocytes Relative: 8 %
Neutro Abs: 1.3 10*3/uL — ABNORMAL LOW (ref 1.7–7.7)
Neutrophils Relative %: 35 %
Platelets: 157 10*3/uL (ref 150–400)
RBC: 3.08 MIL/uL — ABNORMAL LOW (ref 3.87–5.11)
RDW: 12.6 % (ref 11.5–15.5)
WBC: 3.6 10*3/uL — ABNORMAL LOW (ref 4.0–10.5)
nRBC: 0 % (ref 0.0–0.2)

## 2024-03-03 NOTE — ED Triage Notes (Signed)
 Pt had fall tonight while trying to put her shoes on. Hit front of head and has hematoma to forehead. Pt is on blood thinners.

## 2024-03-03 NOTE — Discharge Instructions (Signed)
 Return to the emergency department if you develop severe headache, difficulty with balance, severe vomiting, or for other new and concerning symptoms.

## 2024-03-03 NOTE — ED Notes (Signed)
 Patient transported to CT

## 2024-03-03 NOTE — ED Provider Notes (Signed)
 [place] EMERGENCY DEPARTMENT AT [facility] Provider Note   CSN: [id number] Arrival date & time: 03/03/24  0240     History  No chief complaint on file.   [name] is a 89 y.o. female.  Patient is a 89 year old female with history of paroxysmal SVT, CHF, diabetes on Plavix .  Patient presenting after a fall.  She was attempting to get dressed when she fell forward and struck her head on the dresser.  She has a contusion to the left upper forehead.  She does not know if she lost consciousness.  She does have pain to the forehead, but denies any neck pain.  Patient brought by EMS uneventfully.       Home Medications Prior to Admission medications   Medication Sig Start Date End Date Taking? Authorizing Provider  acetaminophen  (TYLENOL ) 500 MG tablet Take 500 mg by mouth every 8 (eight) hours as needed for mild pain or moderate pain.    [provider]  ALPRAZolam  (XANAX ) 0.5 MG tablet Take 0.5 mg by mouth 2 (two) times daily as needed for anxiety.     [provider]  atorvastatin  (LIPITOR) 10 MG tablet Take 1 tablet (10 mg total) by mouth daily. 02/20/14   Luevenia Saha, MD  cetirizine (ZYRTEC) 10 MG chewable tablet Chew 10 mg by mouth daily.    [provider]  cholecalciferol  (VITAMIN D ) 1000 UNITS tablet Take 2,000 Units by mouth every morning.    [provider]  citalopram  (CELEXA ) 20 MG tablet Take 1 tablet by mouth daily. 06/05/18   [provider]  clopidogrel  (PLAVIX ) 75 MG tablet Take 75 mg by mouth daily.    [provider]  docusate sodium  (COLACE) 100 MG capsule Take 100 mg by mouth 2 (two) times daily.    [provider]  feeding supplement, GLUCERNA SHAKE, (GLUCERNA SHAKE) LIQD Take 237 mLs by mouth daily as needed.     [provider]  FREESTYLE LITE test strip 1 each 3 (three) times daily. 03/08/20   [provider]  furosemide  (LASIX ) 40 MG tablet Take 1.5 tablets (60  mg total) by mouth daily. 02/18/16   Eilleen Grates, MD  hydrALAZINE  (APRESOLINE ) 10 MG tablet TAKE 1 TABLET IN THE MORNING AND AT BEDTIME 12/15/23   Knox Perl, MD  insulin  aspart (NOVOLOG ) 100 UNIT/ML FlexPen Inject 5 Units into the skin 3 (three) times daily with meals. When BG is over 100    [provider]  insulin  glargine (LANTUS ) 100 UNIT/ML Solostar Pen Inject 8 Units into the skin at bedtime.     [provider]  macitentan  (OPSUMIT ) 10 MG tablet Take 1 tablet (10 mg total) by mouth daily. 12/31/23   Knox Perl, MD  metoprolol  tartrate (LOPRESSOR ) 25 MG tablet Take 12.5 mg by mouth 2 (two) times daily. 12.5mg  in the AM, 12.5mg  in the PM 06/10/21   [provider]  mirtazapine  (REMERON ) 30 MG tablet Take 30 mg by mouth at bedtime.    [provider]  Multiple Vitamin (MULTIVITAMIN) tablet Take 1 tablet by mouth daily. Reported on 02/06/2016    [provider]  olmesartan  (BENICAR ) 5 MG tablet TAKE 1 TABLET DAILY 05/03/23   Knox Perl, MD  pantoprazole  (PROTONIX ) 40 MG tablet TAKE 1 TABLET TWICE A DAY BEFORE MEALS Patient taking differently: 40 mg daily. 09/25/20   Delman Ferns, NP  Polyethyl Glycol-Propyl Glycol (SYSTANE OP) Apply to eye as needed.    [provider]  potassium chloride  (KLOR-CON ) 10 MEQ tablet TAKE 1 TABLET DAILY 04/10/22   Knox Perl, MD  sodium chloride  (OCEAN) 0.65 % SOLN nasal spray Place 1 spray into both nostrils as needed for congestion.    [provider]  thyroid  (ARMOUR) 15 MG tablet Take 15 mg by mouth daily.    [provider]  umeclidinium bromide  (INCRUSE ELLIPTA ) 62.5 MCG/INH AEPB Inhale 1 puff into the lungs daily.    [provider]  ursodiol  (ACTIGALL ) 500 MG tablet Take 1 tablet (500 mg total) by mouth 2 (two) times daily with a meal. 02/29/24   Delman Ferns, NP  Wheat Dextrin (BENEFIBER PO) Take by mouth daily.    [provider]      Allergies    Aricept [donepezil  hcl], Namenda [memantine hcl], Adcirca [tadalafil], Donepezil, Spironolactone , Codeine, Indocin  [indomethacin ], Latex, and Penicillins    Review of Systems   Review of Systems  All other systems reviewed and are negative.   Physical Exam Updated Vital Signs There were no vitals taken for this visit. Physical Exam Vitals and nursing note reviewed.  Constitutional:      General: She is not in acute distress.    Appearance: She is well-developed. She is not diaphoretic.  HENT:     Head: Normocephalic.     Comments: There is a swollen area noted to the left upper forehead.  No laceration or bleeding. Neck:     Comments: There is no cervical spine tenderness and she has painless range of motion in all directions. Cardiovascular:     Rate and Rhythm: Normal rate and regular rhythm.     Heart sounds: No murmur heard.    No friction rub. No gallop.  Pulmonary:     Effort: Pulmonary effort is normal. No respiratory distress.     Breath sounds: Normal breath sounds. No wheezing.  Abdominal:     General: Bowel sounds are normal. There is no distension.     Palpations: Abdomen is soft.     Tenderness: There is no abdominal tenderness.  Musculoskeletal:        General: Normal range of motion.     Cervical back: Normal range of motion and neck supple.  Skin:    General: Skin is warm and dry.  Neurological:     General: No focal deficit present.     Mental Status: She is alert and oriented to person, place, and time.     Cranial Nerves: No cranial nerve deficit.     Motor: No weakness.     ED Results / Procedures / Treatments   Labs (all labs ordered are listed, but only abnormal results are displayed) Labs Reviewed  BASIC METABOLIC PANEL WITH GFR  CBC WITH DIFFERENTIAL/PLATELET    EKG None  Radiology No results found.  Procedures Procedures    Medications Ordered in ED Medications - No data to display  ED Course/ Medical Decision Making/ A&P  Patient is a  89 year old female presenting after a fall.  She struck her head on the dresser after falling while getting dressed.  There was no loss of consciousness, but she does arrive with a large knot to her left upper forehead.  Vital signs are stable and patient is afebrile.  She is neurologically intact.  Cervical spine is nontender and cleared clinically.  CT scan of the head obtained showing soft tissue contusion, but no intracranial trauma and no skull fracture.  Patient to be discharged with head precautions  and as needed return.  Final Clinical Impression(s) / ED Diagnoses Final diagnoses:  None    Rx / DC Orders ED Discharge Orders     None         Orvilla Blander, MD 03/03/24 [phone]

## 2024-04-13 ENCOUNTER — Ambulatory Visit: Admitting: Cardiology

## 2024-04-14 ENCOUNTER — Ambulatory Visit: Admitting: Cardiology

## 2024-04-27 ENCOUNTER — Other Ambulatory Visit: Payer: Self-pay | Admitting: Cardiology

## 2024-06-12 ENCOUNTER — Other Ambulatory Visit: Payer: Self-pay | Admitting: Cardiology

## 2024-06-12 DIAGNOSIS — I10 Essential (primary) hypertension: Secondary | ICD-10-CM

## 2024-08-10 ENCOUNTER — Ambulatory Visit: Admitting: Cardiology

## 2024-08-18 ENCOUNTER — Ambulatory Visit: Attending: Cardiology | Admitting: Cardiology

## 2024-08-18 ENCOUNTER — Encounter: Payer: Self-pay | Admitting: Cardiology

## 2024-08-18 VITALS — BP 172/58 | HR 75 | Resp 16 | Ht 64.0 in | Wt 145.8 lb

## 2024-08-18 DIAGNOSIS — I10 Essential (primary) hypertension: Secondary | ICD-10-CM | POA: Diagnosis present

## 2024-08-18 DIAGNOSIS — I270 Primary pulmonary hypertension: Secondary | ICD-10-CM | POA: Diagnosis present

## 2024-08-18 DIAGNOSIS — I5032 Chronic diastolic (congestive) heart failure: Secondary | ICD-10-CM | POA: Insufficient documentation

## 2024-08-18 DIAGNOSIS — Z66 Do not resuscitate: Secondary | ICD-10-CM | POA: Insufficient documentation

## 2024-08-18 NOTE — Patient Instructions (Signed)
 Medication Instructions:  Your physician recommends that you continue on your current medications as directed. Please refer to the Current Medication list given to you today.  *If you need a refill on your cardiac medications before your next appointment, please call your pharmacy*  Lab Work: None ordered.  If you have labs (blood work) drawn today and your tests are completely normal, you will receive your results only by: MyChart Message (if you have MyChart) OR A paper copy in the mail If you have any lab test that is abnormal or we need to change your treatment, we will call you to review the results.  Testing/Procedures: None ordered.   Follow-Up: At [facility], you and your health needs are our priority.  As part of our continuing mission to provide you with exceptional heart care, our providers are all part of one team.  This team includes your primary Cardiologist (physician) and Advanced Practice Providers or APPs (Physician Assistants and Nurse Practitioners) who all work together to provide you with the care you need, when you need it.  Your next appointment:   12 months with Dr Ganji

## 2024-08-18 NOTE — Progress Notes (Signed)
 Cardiology Office Note:  .   Date:  08/18/2024  ID:  [name], DOB [birth year], MRN [id number] PCP: Gerome Brunet, DO  [facility] Providers Cardiologist:  Gordy Bergamo, MD   History of Present Illness: [name] is a 89 y.o. [facility] patient with primary pulmonary hypertension, cryptogenic cirrhosis of the liver, presently on Opsumit  since July 2018, did not tolerate Adcirca or Sildanafil due to visual side effects. She has history of breast cancer status post radical mastectomy and chemotherapy, well controlled diabetes mellitus, moderate coronary artery disease and bilateral carotid artery occlusion without stroke and is on chronic plavix .   No recent hospital visits or ED visits with regard to cardiac issues although she had accidental fall on 03/04/2023 and was seen in the ED for head injury and discharged home. Denies leg edema, denies worsening dyspnea, no change in oxygen  supplementation needs and no chest pain or palpitations.   Cardiac Studies relevent.        Discussed the use of AI scribe software for clinical note transcription with the patient, who gave verbal consent to proceed.  History of Present Illness [name] is a 89 year old female with pulmonary hypertension and pulmonary fibrosis who presents for a routine follow-up.  Her blood pressure is typically well-controlled at home, ranging from 120s/50s to 130s/50s, but is elevated during visits, likely due to stress. She is on olmesartan , metoprolol  tartrate, and hydralazine  for blood pressure management.  Pulmonary hypertension is managed with Opsumit , and her oxygen  requirements remain stable. She experiences occasional coughing spells with yellowish-amber phlegm, sometimes with a speck of blood.  Her weight is stable with no leg swelling or ascites. She takes furosemide  (Lasix ) 40 mg, one and a half tablets every morning, for fluid management.   Labs    ROS  Review of Systems  Cardiovascular:   Positive for dyspnea on exertion. Negative for chest pain and leg swelling.   Physical Exam:   VS:  BP (!) 172/58 (BP Location: Left Arm, Patient Position: Sitting, Cuff Size: Normal)   Pulse 75   Resp 16   Ht 5' 4 (1.626 m)   Wt 145 lb 12.8 oz (66.1 kg)   SpO2 97%   BMI 25.03 kg/m    Wt Readings from Last 3 Encounters:  08/18/24 145 lb 12.8 oz (66.1 kg)  02/29/24 149 lb 3.2 oz (67.7 kg)  10/11/23 149 lb 9.6 oz (67.9 kg)    BP Readings from Last 3 Encounters:  08/18/24 (!) 172/58  03/03/24 (!) 148/54  02/29/24 (!) 169/62   Physical Exam Neck:     Vascular: Carotid bruit (bilateral) present. No JVD.  Cardiovascular:     Rate and Rhythm: Normal rate and regular rhythm.     Pulses: Normal pulses and intact distal pulses.     Heart sounds: Murmur heard.     Midsystolic murmur is present with a grade of 2/6 at the lower left sternal border.     No gallop.  Pulmonary:     Effort: Pulmonary effort is normal.     Breath sounds: Examination of the right-middle field reveals rales. Examination of the right-lower field reveals rhonchi and rales. Examination of the left-lower field reveals rales. Rhonchi and rales present.  Abdominal:     General: Bowel sounds are normal.     Palpations: Abdomen is soft.  Musculoskeletal:     Right lower leg: No edema.     Left lower leg: No edema.  EKG:    EKG Interpretation Date/Time:  Friday August 18 2024 16:22:22 EDT Ventricular Rate:  55 PR Interval:  404 QRS Duration:  78 QT Interval:  464 QTC Calculation: 443 R Axis:   -19  Text Interpretation: EKG 08/18/2024: Sinus bradycardia at the rate of 55 bpm with marked first-degree AV block, PR interval 404 ms, normal axis, single PVC.  Compared to 10/11/2023, PVC Knoop otherwise no change. Confirmed by Mays Paino, Jagadeesh ([id number]) on 08/18/2024 4:37:37 PM    ASSESSMENT AND PLAN: .      ICD-10-CM   1. Primary pulmonary hypertension (HCC)  I27.0 EKG 12-Lead    2. Diastolic CHF, chronic (HCC)   P49.67     3. Essential hypertension  I10       Assessment and Plan Assessment & Plan Primary pulmonary hypertension Primary pulmonary hypertension is well controlled with Opsumit . No changes in oxygen  requirements and no signs of heart failure or respiratory distress. - Continue Opsumit  for pulmonary hypertension  Chronic diastolic heart failure No signs of heart failure exacerbation. Weight is stable, and there is no leg swelling or ascites. Current diuretic therapy with furosemide  is effective in managing fluid status. - Continue furosemide  for fluid management - Monitor weight daily and adjust furosemide  dosage if significant weight changes occur  Essential hypertension Blood pressure readings at home are generally well controlled, with systolic readings in the 120s to 130s and diastolic readings rarely reaching 60. Elevated blood pressure readings in the office are likely due to stress. Current medication regimen includes olmesartan , metoprolol , and hydralazine . - Continue current antihypertensive medications: olmesartan , metoprolol , and hydralazine  - Advise to check blood pressure once a week instead of twice daily  Pulmonary fibrosis Occasional coughing spells with yellowish amber sputum and occasional specks of blood, but no fever or significant respiratory distress. Oxygen  requirements remain stable. - Monitor for any signs of infection or increased respiratory distress  Do not resuscitate (DNR) status DNR status is confirmed and discussed. She and her daughter have agreed to DNR status, and the chart will be updated to reflect this. The focus is on comfort and avoiding unnecessary interventions in the event of severe respiratory or cardiac distress. - Update chart to reflect DNR status - Ensure comfort measures are prioritized in case of severe distress   Follow up: 1 Year.  Although primary pulm hypertension requires intensive evaluation and management, she has remained  stable over the years and she is presently 89 years of age.  Hence I have not recommended any repeat echocardiogram or 6-minute walk test etc.  Will follow conservative approach.  Excellent response to Opsumit .  Signed,  Gordy Bergamo, MD, [facility] 08/18/2024, 4:38 PM [facility] [address] [place], [place] [address] Phone: [phone]. Fax:  [phone]

## 2024-10-09 ENCOUNTER — Encounter: Payer: Self-pay | Admitting: Internal Medicine

## 2024-10-25 ENCOUNTER — Other Ambulatory Visit: Payer: Self-pay | Admitting: Cardiology
